# Patient Record
Sex: Male | Born: 1965
Health system: Southern US, Community
[De-identification: ages and names within clinical notes are randomized; demographics above are authoritative.]

## PROBLEM LIST (undated history)

## (undated) DIAGNOSIS — T7840XA Allergy, unspecified, initial encounter: Secondary | ICD-10-CM

## (undated) DIAGNOSIS — R0600 Dyspnea, unspecified: Secondary | ICD-10-CM

## (undated) DIAGNOSIS — E785 Hyperlipidemia, unspecified: Secondary | ICD-10-CM

## (undated) DIAGNOSIS — I509 Heart failure, unspecified: Secondary | ICD-10-CM

## (undated) DIAGNOSIS — E119 Type 2 diabetes mellitus without complications: Secondary | ICD-10-CM

## (undated) DIAGNOSIS — D649 Anemia, unspecified: Secondary | ICD-10-CM

## (undated) DIAGNOSIS — M109 Gout, unspecified: Secondary | ICD-10-CM

## (undated) DIAGNOSIS — J45909 Unspecified asthma, uncomplicated: Secondary | ICD-10-CM

## (undated) DIAGNOSIS — I10 Essential (primary) hypertension: Secondary | ICD-10-CM

## (undated) HISTORY — DX: Morbid (severe) obesity due to excess calories: E66.01

## (undated) HISTORY — DX: Essential (primary) hypertension: I10

## (undated) HISTORY — DX: Allergy, unspecified, initial encounter: T78.40XA

## (undated) HISTORY — DX: Hyperlipidemia, unspecified: E78.5

## (undated) HISTORY — DX: Gout, unspecified: M10.9

## (undated) HISTORY — DX: Type 2 diabetes mellitus without complications: E11.9

## (undated) HISTORY — DX: Unspecified asthma, uncomplicated: J45.909

---

## 2004-01-25 ENCOUNTER — Ambulatory Visit: Payer: Self-pay | Admitting: Family Medicine

## 2004-02-20 ENCOUNTER — Ambulatory Visit: Payer: Self-pay | Admitting: Family Medicine

## 2005-11-17 ENCOUNTER — Emergency Department: Payer: Self-pay | Admitting: Internal Medicine

## 2009-03-05 ENCOUNTER — Emergency Department: Payer: Self-pay

## 2009-04-30 ENCOUNTER — Ambulatory Visit: Payer: Self-pay | Admitting: Family Medicine

## 2011-04-29 DIAGNOSIS — I5022 Chronic systolic (congestive) heart failure: Secondary | ICD-10-CM | POA: Insufficient documentation

## 2011-11-01 ENCOUNTER — Emergency Department: Payer: Self-pay | Admitting: Emergency Medicine

## 2014-03-23 LAB — LIPID PANEL
Cholesterol: 164 mg/dL (ref 0–200)
HDL: 55 mg/dL (ref 35–70)
LDL Cholesterol: 79 mg/dL
Triglycerides: 150 mg/dL (ref 40–160)

## 2014-03-23 LAB — HEMOGLOBIN A1C: HEMOGLOBIN A1C: 16.1 % — AB (ref 4.0–6.0)

## 2014-03-23 LAB — BASIC METABOLIC PANEL
BUN: 23 mg/dL — AB (ref 4–21)
CREATININE: 1.5 mg/dL — AB (ref 0.6–1.3)
Glucose: 407 mg/dL
POTASSIUM: 4.3 mmol/L (ref 3.4–5.3)
SODIUM: 139 mmol/L (ref 137–147)

## 2014-03-23 LAB — HEPATIC FUNCTION PANEL
ALT: 15 U/L (ref 10–40)
AST: 13 U/L — AB (ref 14–40)

## 2014-04-20 ENCOUNTER — Ambulatory Visit
Admit: 2014-04-20 | Disposition: A | Discharge status: Home / Self Care | Payer: Self-pay | Attending: Family Medicine | Admitting: Family Medicine

## 2014-04-27 ENCOUNTER — Ambulatory Visit
Admit: 2014-04-27 | Disposition: A | Discharge status: Home / Self Care | Payer: Self-pay | Attending: Family Medicine | Admitting: Family Medicine

## 2014-04-28 DIAGNOSIS — I420 Dilated cardiomyopathy: Secondary | ICD-10-CM | POA: Insufficient documentation

## 2014-04-28 DIAGNOSIS — M109 Gout, unspecified: Secondary | ICD-10-CM | POA: Insufficient documentation

## 2014-04-28 DIAGNOSIS — Z6841 Body Mass Index (BMI) 40.0 and over, adult: Secondary | ICD-10-CM

## 2014-07-06 ENCOUNTER — Ambulatory Visit: Payer: Self-pay | Admitting: Family Medicine

## 2014-07-10 ENCOUNTER — Encounter: Payer: Self-pay | Admitting: Family Medicine

## 2014-07-10 DIAGNOSIS — N183 Chronic kidney disease, stage 3 unspecified: Secondary | ICD-10-CM

## 2014-07-10 DIAGNOSIS — IMO0001 Reserved for inherently not codable concepts without codable children: Secondary | ICD-10-CM

## 2014-07-10 DIAGNOSIS — R809 Proteinuria, unspecified: Secondary | ICD-10-CM | POA: Insufficient documentation

## 2014-07-10 DIAGNOSIS — E1129 Type 2 diabetes mellitus with other diabetic kidney complication: Secondary | ICD-10-CM | POA: Insufficient documentation

## 2014-07-10 DIAGNOSIS — N2581 Secondary hyperparathyroidism of renal origin: Secondary | ICD-10-CM | POA: Insufficient documentation

## 2014-07-10 DIAGNOSIS — N184 Chronic kidney disease, stage 4 (severe): Secondary | ICD-10-CM | POA: Insufficient documentation

## 2014-07-10 DIAGNOSIS — I10 Essential (primary) hypertension: Secondary | ICD-10-CM

## 2014-07-10 DIAGNOSIS — I129 Hypertensive chronic kidney disease with stage 1 through stage 4 chronic kidney disease, or unspecified chronic kidney disease: Secondary | ICD-10-CM | POA: Insufficient documentation

## 2014-07-11 ENCOUNTER — Ambulatory Visit: Payer: Self-pay | Admitting: Family Medicine

## 2014-07-16 ENCOUNTER — Ambulatory Visit: Payer: Self-pay | Admitting: Family Medicine

## 2014-07-22 ENCOUNTER — Other Ambulatory Visit: Payer: Self-pay | Admitting: Family Medicine

## 2014-07-23 ENCOUNTER — Telehealth: Payer: Self-pay | Admitting: Family Medicine

## 2014-07-23 NOTE — Telephone Encounter (Signed)
Please call pharmacy; find out if he has been compliant with filling the 300 mg pills or if he is just taking 100 mg This needs to be tapered and monitored Find out what he filled last and when Thanks

## 2014-07-23 NOTE — Telephone Encounter (Signed)
Routing to provider  

## 2014-07-23 NOTE — Telephone Encounter (Signed)
Per pharmacy, he has been getting 100mg .

## 2014-07-23 NOTE — Telephone Encounter (Signed)
Pt called needs a refill on Allipurinol? Pharm is [facility]. Thanks.

## 2014-07-23 NOTE — Telephone Encounter (Signed)
This is a duplicate message request.

## 2014-07-24 NOTE — Telephone Encounter (Signed)
I'm sorry, but I'm not going to refill this He has not had labs done in months He was supposed to start back on this medicine and have labs done 2 weeks later He cancelled his appointments with me and has not been in for a visit or labs as directed I will not refill this until he's seen and we get lab results back Please let him know we absolutely have to see him; there is a reason this medicine isn't just over-the-counter; it takes monitoring and can cause serious harm if not monitored Let him know that by not being compliant with visits and labs, he is putting himself at risk for a gout flare Avoid/limit foods high in purines (meats, gravies, etc.)

## 2014-07-24 NOTE — Telephone Encounter (Signed)
Patient notified

## 2014-08-07 ENCOUNTER — Ambulatory Visit (INDEPENDENT_AMBULATORY_CARE_PROVIDER_SITE_OTHER): Payer: PRIVATE HEALTH INSURANCE | Admitting: Family Medicine

## 2014-08-07 ENCOUNTER — Encounter: Payer: Self-pay | Admitting: Family Medicine

## 2014-08-07 VITALS — BP 133/85 | HR 90 | Temp 98.7°F | Wt 387.0 lb

## 2014-08-07 DIAGNOSIS — I10 Essential (primary) hypertension: Secondary | ICD-10-CM | POA: Diagnosis not present

## 2014-08-07 DIAGNOSIS — R809 Proteinuria, unspecified: Secondary | ICD-10-CM

## 2014-08-07 DIAGNOSIS — M1A39X0 Chronic gout due to renal impairment, multiple sites, without tophus (tophi): Secondary | ICD-10-CM | POA: Diagnosis not present

## 2014-08-07 DIAGNOSIS — I420 Dilated cardiomyopathy: Secondary | ICD-10-CM

## 2014-08-07 DIAGNOSIS — IMO0001 Reserved for inherently not codable concepts without codable children: Secondary | ICD-10-CM

## 2014-08-07 DIAGNOSIS — N183 Chronic kidney disease, stage 3 unspecified: Secondary | ICD-10-CM

## 2014-08-07 DIAGNOSIS — Z5181 Encounter for therapeutic drug level monitoring: Secondary | ICD-10-CM

## 2014-08-07 DIAGNOSIS — E1165 Type 2 diabetes mellitus with hyperglycemia: Secondary | ICD-10-CM

## 2014-08-07 LAB — LIPID PANEL PICCOLO, WAIVED
Chol/HDL Ratio Piccolo,Waive: 2.6 mg/dL
Cholesterol Piccolo, Waived: 110 mg/dL (ref <200)
HDL Chol Piccolo, Waived: 42 mg/dL — ABNORMAL LOW (ref >59)
LDL CHOL CALC PICCOLO WAIVED: 36 mg/dL (ref <100)
Triglycerides Piccolo,Waived: 159 mg/dL — ABNORMAL HIGH (ref <150)
VLDL Chol Calc Piccolo,Waive: 32 mg/dL — ABNORMAL HIGH (ref <30)

## 2014-08-07 LAB — BAYER DCA HB A1C WAIVED: HB A1C (BAYER DCA - WAIVED): 9.2 % — ABNORMAL HIGH (ref <7.0)

## 2014-08-07 NOTE — Assessment & Plan Note (Signed)
Almost to goal; limit salt, try to follow DASH guidelines

## 2014-08-07 NOTE — Assessment & Plan Note (Signed)
Well overdue today for A1C; checked here in the office

## 2014-08-07 NOTE — Assessment & Plan Note (Addendum)
Check uric acid level and creatinine; no more flares in a long time; continue allopurinol; not using colchicine at all, just has it on hand if needed; he prefers to use ibuprofen for flare

## 2014-08-07 NOTE — Assessment & Plan Note (Signed)
Creatinine part of today's labs

## 2014-08-07 NOTE — Assessment & Plan Note (Signed)
Going back to see heart doctor soon; limit salt

## 2014-08-07 NOTE — Patient Instructions (Signed)
I'll contact you with the lab results  Your goal blood pressure is less than 130 mmHg on top Try to follow the DASH guidelines (DASH stands for Dietary Approaches to Stop Hypertension) Try to limit the sodium in your diet.  Ideally, consume less than 1.5 grams (less than 1,500mg ) per day. Do not add salt when cooking or at the table.  Check the sodium amount on labels when shopping, and choose items lower in sodium when given a choice. Avoid or limit foods that already contain a lot of sodium. Eat a diet rich in fruits and vegetables and whole grains. Keep working on weight loss Check feet every night for sores or callouses or cuts and let us know right away of any problems Please do make an appointment to see your eye doctor and visit yearly

## 2014-08-07 NOTE — Progress Notes (Signed)
BP 133/85 mmHg  Pulse 90  Temp(Src) 98.7 F (37.1 C)  Wt 387 lb (175.542 kg)  SpO2 90%   Subjective:    Patient ID: [name], male    DOB: 1965-04-26, 49 y.o.   MRN: [id number]  HPI: [name] is a 49 y.o. male  Chief Complaint  Patient presents with  . Diabetes  . Hypertension  . Hyperlipidemia   Diabetes mellitus type 2 Checking sugars?  yes How often? Once a day or every 2-3 days Range (low to high) over last two weeks: 180 to 184; no lows at all Feels diabetes is under good control Does patient feel additional teaching/training would be helpful?  yes, maybe later Trying to limit white bread, white rice, white potatoes, sweets?  yes, limiting, not completely eliminated Trying to limit sweetened drinks like iced tea, soft drinks, sports drinks, fruit juices?  yes Exercise/activity level:  sedentary (essential no activity) Checking feet every day/night?  yes Last eye exam: overdue Started Trulicity about 3 weeks ago; no abdominal pain with Trulicity; he thinks he needs more insulin; using glucophage pill; on Trulicity; no insulin right now  Lab Results  Component Value Date   HGBA1C 16.1* 03/23/2014   Seeing kidney doctor for decreased kidney function but he was okay with using metformin; not sure when he goes back to see kidney doctor   Sees heart doctor, overdue, was supposed to be seen in January; his usual doctor retired and he has a new person; [employer]  Relevant past medical, surgical, family and social history reviewed and updated as indicated. Interim medical history since our last visit reviewed. Allergies and medications reviewed and updated.  Review of Systems Per HPI unless specifically indicated above     Objective:    BP 133/85 mmHg  Pulse 90  Temp(Src) 98.7 F (37.1 C)  Wt 387 lb (175.542 kg)  SpO2 90%  Wt Readings from Last 3 Encounters:  08/07/14 387 lb (175.542 kg)  04/04/14 385 lb (174.635 kg)    Physical Exam   Constitutional: He appears well-developed and well-nourished.  HENT:  Head: Normocephalic and atraumatic.  Eyes: No scleral icterus.  Neck: No thyromegaly present.  Cardiovascular: Normal rate and regular rhythm.   Heart sounds distant due to body habitus  Pulmonary/Chest: Effort normal and breath sounds normal. He has no wheezes.  Abdominal: Soft. Bowel sounds are normal. There is no tenderness.  Morbidly obese with large pannus  Musculoskeletal: He exhibits edema (nonpitting edema both lower extremities).  Skin: Skin is warm and dry. No pallor.  Psychiatric: He has a normal mood and affect. His behavior is normal. Judgment and thought content normal.   Diabetic Foot Form - Detailed   Diabetic Foot Exam - detailed  Diabetic Foot exam was performed with the following findings:  Yes 08/07/2014  9:07 AM  Visual Foot Exam completed.:  Yes  Is there a history of foot ulcer?:  No  Can the patient see the bottom of their feet?:  Yes    Pulse Foot Exam completed.:  Yes  Right Dorsalis Pedis:  Present Left Dorsalis Pedis:  Present  Sensory Foot Exam Completed.:  Yes  Semmes-Weinstein Monofilament Test  R Site 1-Great Toe:  Pos L Site 1-Great Toe:  Pos  R Site 4:  Pos L Site 4:  Pos    Comments:  Intact to vibration sense both great toes; intact to monofilament at sites 1, 4, 10      Results for  orders placed or performed in visit on [id number]  Basic metabolic panel  Result Value Ref Range   Glucose 407 mg/dL   BUN 23 (A) 4 - 21 mg/dL   Creatinine 1.5 (A) 0.6 - 1.3 mg/dL   Potassium 4.3 3.4 - 5.3 mmol/L   Sodium 139 137 - 147 mmol/L  Lipid panel  Result Value Ref Range   Triglycerides 150 40 - 160 mg/dL   Cholesterol 164 0 - 200 mg/dL   HDL 55 35 - 70 mg/dL   LDL Cholesterol 79 mg/dL  Hepatic function panel  Result Value Ref Range   ALT 15 10 - 40 U/L   AST 13 (A) 14 - 40 U/L  Hemoglobin A1c  Result Value Ref Range   Hgb A1c MFr Bld 16.1 (A) 4.0 - 6.0 %      Assessment &  Plan:   Problem List Items Addressed This Visit      Cardiovascular and Mediastinum   Dilated cardiomyopathy    Going back to see heart doctor soon; limit salt      Benign hypertension    Almost to goal; limit salt, try to follow DASH guidelines        Endocrine   Uncontrolled type 2 diabetes mellitus with proteinuria or albuminuria - Primary    Well overdue today for A1C; checked here in the office      Relevant Medications   metFORMIN (GLUCOPHAGE-XR) 500 MG 24 hr tablet   TRULICITY [id number] SOPN   Other Relevant Orders   Lipid Panel Piccolo, Waived   Bayer DCA Hb A1c Waived     Genitourinary   Chronic kidney disease (CKD), stage III (moderate)    Creatinine part of today's labs        Other   Morbid obesity    Keep working on healthy diet; if he could lose 100 pounds, his health would improve so much      Relevant Medications   metFORMIN (GLUCOPHAGE-XR) 500 MG 24 hr tablet   TRULICITY [id number] SOPN   Gout    Check uric acid level and creatinine; no more flares in a long time; continue allopurinol; not using colchicine at all, just has it on hand if needed; he prefers to use ibuprofen for flare      Relevant Orders   Comprehensive metabolic panel    Other Visit Diagnoses    Medication monitoring encounter        Relevant Orders    Uric acid      An After Visit Summary was printed and given to the patient.  Follow up plan: Return in about 3 months (around 11/07/2014) for diabetes, gout.

## 2014-08-07 NOTE — Assessment & Plan Note (Signed)
Keep working on [employer]; if he could lose 100 pounds, his health would improve so much

## 2014-08-08 ENCOUNTER — Other Ambulatory Visit: Payer: Self-pay | Admitting: Family Medicine

## 2014-08-08 DIAGNOSIS — IMO0001 Reserved for inherently not codable concepts without codable children: Secondary | ICD-10-CM

## 2014-08-08 LAB — COMPREHENSIVE METABOLIC PANEL
ALT: 16 IU/L (ref 0–44)
AST: 11 IU/L (ref 0–40)
Albumin/Globulin Ratio: 1.1 (ref 1.1–2.5)
Albumin: 3.3 g/dL — ABNORMAL LOW (ref 3.5–5.5)
Alkaline Phosphatase: 79 IU/L (ref 39–117)
BUN / CREAT RATIO: 13 (ref 9–20)
BUN: 23 mg/dL (ref 6–24)
Bilirubin Total: 0.5 mg/dL (ref 0.0–1.2)
CHLORIDE: 96 mmol/L — AB (ref 97–108)
CO2: 31 mmol/L — ABNORMAL HIGH (ref 18–29)
Calcium: 8.9 mg/dL (ref 8.7–10.2)
Creatinine, Ser: 1.74 mg/dL — ABNORMAL HIGH (ref 0.76–1.27)
GFR calc non Af Amer: 46 mL/min/{1.73_m2} — ABNORMAL LOW (ref >59)
GFR, EST AFRICAN AMERICAN: 53 mL/min/{1.73_m2} — AB (ref >59)
Globulin, Total: 3 g/dL (ref 1.5–4.5)
Glucose: 189 mg/dL — ABNORMAL HIGH (ref 65–99)
POTASSIUM: 4.2 mmol/L (ref 3.5–5.2)
SODIUM: 142 mmol/L (ref 134–144)
Total Protein: 6.3 g/dL (ref 6.0–8.5)

## 2014-08-08 LAB — URIC ACID: Uric Acid: 8.1 mg/dL (ref 3.7–8.6)

## 2014-08-08 MED ORDER — TRULICITY 0.75 MG/0.5ML ~~LOC~~ SOAJ: 1.0000 "pen " | SUBCUTANEOUS | Status: DC

## 2014-08-08 MED ORDER — INSULIN GLARGINE 300 UNIT/ML ~~LOC~~ SOPN: 15.0000 [IU] | PEN_INJECTOR | Freq: Every evening | SUBCUTANEOUS | Status: DC

## 2014-08-08 NOTE — Telephone Encounter (Signed)
We'll ask his kidney doctor to manage his gout from now on; please send refill request to them along with the latest labs; I'm concerned about this medicine with his kidney function so I'd very much appreciate if the kidney doctor will take this over

## 2014-08-08 NOTE — Telephone Encounter (Signed)
Routing to provider  

## 2014-08-09 NOTE — Telephone Encounter (Signed)
This message and labs faxed to [facility]. Advised them to let me if they can't take over the RX or if they have any questions.

## 2014-08-10 ENCOUNTER — Other Ambulatory Visit: Payer: Self-pay | Admitting: Family Medicine

## 2014-08-13 NOTE — Telephone Encounter (Signed)
Your rx 

## 2014-08-19 ENCOUNTER — Other Ambulatory Visit: Payer: Self-pay | Admitting: Family Medicine

## 2014-08-29 ENCOUNTER — Other Ambulatory Visit: Payer: Self-pay | Admitting: Family Medicine

## 2014-08-29 NOTE — Telephone Encounter (Signed)
Routing to provider  

## 2014-09-06 ENCOUNTER — Telehealth: Payer: Self-pay

## 2014-09-06 MED ORDER — INSULIN GLARGINE 300 UNIT/ML ~~LOC~~ SOPN: 15.0000 [IU] | PEN_INJECTOR | Freq: Every evening | SUBCUTANEOUS | Status: DC

## 2014-09-06 NOTE — Telephone Encounter (Signed)
No, but I have sent a prescription to [facility]

## 2014-09-06 NOTE — Telephone Encounter (Signed)
Left message for patient that we are unable to provide samples any more and that an rx was sent to his pharmacy.

## 2014-09-06 NOTE — Telephone Encounter (Signed)
He would like samples of Toujeo.

## 2014-09-12 ENCOUNTER — Telehealth: Payer: Self-pay

## 2014-09-12 NOTE — Telephone Encounter (Signed)
Ranges 140-180 range; checking sugars afternoon or sometimes in the morning; if he doesn't eat anything, 130-140 He was thinking 100 is the range; no, that's too low Explained that metformin can be dangerous I encouraged him to talk to [place] or [place] at pharmacy and find out what the cheapest insulin and I will switch him over

## 2014-09-12 NOTE — Telephone Encounter (Signed)
I notified patient to not take the Metformin at all. He states that his glucose readings are staying in the 180s.

## 2014-09-12 NOTE — Telephone Encounter (Signed)
He can't afford the Toujeo, he states it is over $100 with his insurance. But he wants to know if he can increase it from 15 to 18 units. Once he runs out of the toujeo, he wants to know if he can take his left over Metformin until he can afford to get the Toujeo. If so, how much and how often does he need to take it?

## 2014-09-12 NOTE — Telephone Encounter (Signed)
NO, he should NOT take metformin What are his sugars? Why is he asking to increase the dose? I can't make that judgment without knowing what his glucose readings are please

## 2014-10-22 ENCOUNTER — Telehealth: Payer: Self-pay | Admitting: Family Medicine

## 2014-10-22 NOTE — Telephone Encounter (Signed)
He states his acid reflux is worse. It was hard to get a clear answer from him, once he said he was taking 4 or 5 acid meds a day and then he changed and said no. That he was taking 4 or 5 omeprazole a day. Sometimes up to 80mg  of omeprazole a day. I advised him that was very dangerous and not to take that much.  He wants to know if there was something else he can take.

## 2014-10-22 NOTE — Telephone Encounter (Signed)
Thank you for advising him that he is taking his medicine inappropriately; appointment please If he's not sure if acid reflux, he needs to call his heart doctor right away or go to the hospital if any chance his discomfort is cardiac

## 2014-10-22 NOTE — Telephone Encounter (Signed)
Advised patient if he thought it was cardiac to seek medical attention immediately, he does not think it is. Reminded him not to take more than 40mg  of Omeprazole a day and not to take other acid reflux meds like Nexium with it. He has an appointment on Oct. 12th, he said he'd wait until then.

## 2014-10-22 NOTE — Telephone Encounter (Signed)
Pt called requested call back ASAP. Stated he has a question. Thanks.

## 2014-11-07 ENCOUNTER — Telehealth: Payer: Self-pay

## 2014-11-07 ENCOUNTER — Ambulatory Visit: Payer: PRIVATE HEALTH INSURANCE | Admitting: Family Medicine

## 2014-11-07 NOTE — Telephone Encounter (Signed)
Patient wants to know if you can "retain water in his stomach". He says he has a lot of bloating and gas. I asked him how long had it been going on and he just said "off and on".Advice?

## 2014-11-07 NOTE — Telephone Encounter (Signed)
He needs an appointment; he was supposed to be seen today; no over the phone advice for this; needs appointment

## 2014-11-07 NOTE — Telephone Encounter (Signed)
Patient left message to call.

## 2014-11-08 ENCOUNTER — Encounter: Payer: Self-pay | Admitting: Family Medicine

## 2014-11-08 NOTE — Telephone Encounter (Signed)
I am going to stop reinforcing this behavior, where he does not come in for appointments and then wants advice over the phone I am sending a letter He needs an appointment I will see him in the office

## 2014-11-08 NOTE — Telephone Encounter (Signed)
Spoke with patient and stated that he supposedly has a stomach virus and he does not need to come in. He said that doing his insulin at night time cause him to not be able to sleep, therefore he wants to know if he can do it in the morning because it gives him more energy.

## 2014-11-15 ENCOUNTER — Telehealth: Payer: Self-pay | Admitting: Family Medicine

## 2014-11-15 NOTE — Telephone Encounter (Signed)
Pt has a spot on his leg that is leaking fluid and he would like to know if there is anything he should do besides putting a band aid on it. He said it started last night and he thinks he may have hit it on something yesterday.

## 2014-11-16 NOTE — Telephone Encounter (Signed)
Routing to provider  

## 2014-11-16 NOTE — Telephone Encounter (Signed)
He needs to make an appt to be seen

## 2014-11-19 NOTE — Telephone Encounter (Signed)
I called 3x and no answer left vm to call us back because he needs to be seen.

## 2014-11-28 ENCOUNTER — Ambulatory Visit: Payer: PRIVATE HEALTH INSURANCE | Admitting: Family Medicine

## 2014-12-10 ENCOUNTER — Telehealth: Payer: Self-pay

## 2014-12-10 NOTE — Telephone Encounter (Signed)
Just wanted to let you know that he has cancelled 6 appt's since we started EPIC. He's cancelled on 07/06/14, 07/01/14, 07/16/14, 11/07/14, 11/28/14, and 11/1714. He has rescheduled again for 12/31/14.

## 2014-12-12 NOTE — Telephone Encounter (Signed)
Please talk with patient about office policy, frequent cancellations; he is noncompliant and this makes it very difficult to treat his serious chronic health conditions

## 2014-12-13 ENCOUNTER — Ambulatory Visit: Payer: PRIVATE HEALTH INSURANCE | Admitting: Family Medicine

## 2014-12-17 ENCOUNTER — Telehealth: Payer: Self-pay

## 2014-12-17 NOTE — Telephone Encounter (Signed)
He is having a flare of varicose veins on his legs, he states he is using a sleeve he had previously and ice. I advised him he'd need to be seen to have them checked, he refused, said he owes [place] too much money. I advised him if it got worse to call our office for an appt.

## 2014-12-17 NOTE — Telephone Encounter (Signed)
Patient is overdue for diabetes follow-up He is noncompliant

## 2014-12-17 NOTE — Telephone Encounter (Signed)
Patient left message asking for me to call him.

## 2014-12-17 NOTE — Telephone Encounter (Signed)
Left message to call.

## 2014-12-18 ENCOUNTER — Other Ambulatory Visit: Payer: Self-pay

## 2014-12-18 DIAGNOSIS — IMO0001 Reserved for inherently not codable concepts without codable children: Secondary | ICD-10-CM

## 2014-12-18 MED ORDER — TRULICITY 0.75 MG/0.5ML ~~LOC~~ SOAJ: 1.0000 "pen " | SUBCUTANEOUS | Status: DC

## 2014-12-18 NOTE — Telephone Encounter (Signed)
LAST VISIT: 08/07/2014 NEXT APPT: 12/31/2014  Request for Trulicity pen 0.75mg /0.5ml

## 2014-12-19 ENCOUNTER — Other Ambulatory Visit: Payer: Self-pay

## 2014-12-19 MED ORDER — OMEPRAZOLE 40 MG PO CPDR: 40.0000 mg | DELAYED_RELEASE_CAPSULE | Freq: Every day | ORAL | Status: DC

## 2014-12-19 NOTE — Telephone Encounter (Signed)
Dr. Sanda Klein patient-routing to provider.

## 2014-12-26 NOTE — Telephone Encounter (Signed)
Appt. Scheduled.

## 2014-12-31 ENCOUNTER — Ambulatory Visit: Payer: PRIVATE HEALTH INSURANCE | Admitting: Family Medicine

## 2015-01-01 ENCOUNTER — Encounter: Payer: Managed Care, Other (non HMO) | Attending: Surgery | Admitting: Surgery

## 2015-01-01 DIAGNOSIS — Z794 Long term (current) use of insulin: Secondary | ICD-10-CM | POA: Insufficient documentation

## 2015-01-01 DIAGNOSIS — I5040 Unspecified combined systolic (congestive) and diastolic (congestive) heart failure: Secondary | ICD-10-CM | POA: Diagnosis not present

## 2015-01-01 DIAGNOSIS — F17218 Nicotine dependence, cigarettes, with other nicotine-induced disorders: Secondary | ICD-10-CM | POA: Diagnosis not present

## 2015-01-01 DIAGNOSIS — E11622 Type 2 diabetes mellitus with other skin ulcer: Secondary | ICD-10-CM | POA: Diagnosis not present

## 2015-01-01 DIAGNOSIS — I890 Lymphedema, not elsewhere classified: Secondary | ICD-10-CM | POA: Insufficient documentation

## 2015-01-01 DIAGNOSIS — L02415 Cutaneous abscess of right lower limb: Secondary | ICD-10-CM | POA: Diagnosis not present

## 2015-01-01 DIAGNOSIS — I10 Essential (primary) hypertension: Secondary | ICD-10-CM | POA: Diagnosis not present

## 2015-01-01 DIAGNOSIS — L97222 Non-pressure chronic ulcer of left calf with fat layer exposed: Secondary | ICD-10-CM | POA: Insufficient documentation

## 2015-01-01 DIAGNOSIS — F329 Major depressive disorder, single episode, unspecified: Secondary | ICD-10-CM | POA: Insufficient documentation

## 2015-01-01 DIAGNOSIS — K219 Gastro-esophageal reflux disease without esophagitis: Secondary | ICD-10-CM | POA: Diagnosis not present

## 2015-01-01 NOTE — Progress Notes (Addendum)
JACEN, MAHANNAH ([id number]) Visit Report for 01/01/2015 Chief Complaint Document Details Patient Name: [name], [name]. Date of Service: 01/01/2015 8:00 AM Medical Record Number: [id number] Patient Account Number: [id number] Date of Birth/Sex: Jun 23, 1965 (49 y.o. Male) Treating RN: Ahmed Prima Primary Care Physician: [name], [place] Other Clinician: Referring Physician: [name], MELINDA Treating Physician/Extender: Frann Rider in Treatment: 0 Information Obtained from: Patient Chief Complaint Patients presents for treatment of an open diabetic ulcer over the right lower extremity with swelling of both legs for about 1 month. Electronic Signature(s) Signed: 01/01/2015 9:59:13 AM By: Christin Fudge MD, FACS Entered By: Christin Fudge on 01/01/2015 09:59:12 [name] ([id number]) -------------------------------------------------------------------------------- Debridement Details Patient Name: [name] Date of Service: 01/01/2015 8:00 AM Medical Record Number: [id number] Patient Account Number: [id number] Date of Birth/Sex: March 20, 1965 (49 y.o. Male) Treating RN: Ahmed Prima Primary Care Physician: [name], [place] Other Clinician: Referring Physician: [name], [place] Treating Physician/Extender: Frann Rider in Treatment: 0 Debridement Performed for Wound #2 Right,Lateral,Superior Lower Leg Assessment: Performed By: Physician Christin Fudge, MD Debridement: Debridement Pre-procedure Yes Verification/Time Out Taken: Start Time: 09:35 Pain Control: Lidocaine 5% topical ointment Level: Skin/Subcutaneous Tissue Total Area Debrided (L x 1 (cm) x 1 (cm) = 1 (cm) W): Tissue and other Viable, Non-Viable, Eschar, Fibrin/Slough, Skin, Subcutaneous material debrided: Instrument: Curette Bleeding: Minimum Hemostasis Achieved: Pressure End Time: 09:40 Procedural Pain: 2 Post Procedural Pain: 0 Response to Treatment: Procedure was tolerated well Post  Debridement Measurements of Total Wound Length: (cm) 1 Width: (cm) 1 Depth: (cm) 0.3 Volume: (cm) 0.236 Post Procedure Diagnosis Same as Pre-procedure Electronic Signature(s) Signed: 01/01/2015 10:03:30 AM By: Christin Fudge MD, FACS Signed: 01/01/2015 6:00:56 PM By: Alric Quan Entered By: Christin Fudge on 01/01/2015 10:03:30 [name] ([id number]) -------------------------------------------------------------------------------- Debridement Details Patient Name: [name] Date of Service: 01/01/2015 8:00 AM Medical Record Number: [id number] Patient Account Number: [id number] Date of Birth/Sex: 1965-05-06 (49 y.o. Male) Treating RN: Ahmed Prima Primary Care Physician: [name], [place] Other Clinician: Referring Physician: [name], [place] Treating Physician/Extender: Frann Rider in Treatment: 0 Debridement Performed for Wound #4 Right,Distal Lower Leg Assessment: Performed By: Physician Christin Fudge, MD Debridement: Debridement Pre-procedure Yes Verification/Time Out Taken: Start Time: 09:35 Pain Control: Lidocaine 5% topical ointment Level: Skin/Subcutaneous Tissue Total Area Debrided (L x 1.3 (cm) x 1.5 (cm) = 1.95 (cm) W): Tissue and other Viable, Non-Viable, Eschar, Fibrin/Slough, Skin, Subcutaneous material debrided: Instrument: Curette Bleeding: Minimum Hemostasis Achieved: Pressure End Time: 09:40 Procedural Pain: 2 Post Procedural Pain: 0 Response to Treatment: Procedure was tolerated well Post Debridement Measurements of Total Wound Length: (cm) 1.3 Width: (cm) 1.5 Depth: (cm) 0.3 Volume: (cm) 0.459 Post Procedure Diagnosis Same as Pre-procedure Electronic Signature(s) Signed: 01/01/2015 10:04:06 AM By: Christin Fudge MD, FACS Signed: 01/01/2015 6:00:56 PM By: Alric Quan Entered By: Christin Fudge on 01/01/2015 10:04:05 [name]  ([id number]) -------------------------------------------------------------------------------- Debridement Details Patient Name: [name] Date of Service: 01/01/2015 8:00 AM Medical Record Number: [id number] Patient Account Number: [id number] Date of Birth/Sex: 08-24-1965 (49 y.o. Male) Treating RN: Ahmed Prima Primary Care Physician: [name], [place] Other Clinician: Referring Physician: [name], MELINDA Treating Physician/Extender: Frann Rider in Treatment: 0 Debridement Performed for Wound #6 Right,Dorsal,Inferior Lower Leg Assessment: Performed By: Physician Christin Fudge, MD Debridement: Debridement Pre-procedure Yes Verification/Time Out Taken: Start Time: 09:35 Pain Control: Lidocaine 5% topical ointment Level: Skin/Subcutaneous Tissue Total Area Debrided (L x 0.3 (cm) x 0.3 (cm) = 0.09 (cm) W): Tissue and other Viable, Non-Viable, Eschar, Fibrin/Slough, Skin, Subcutaneous material  debrided: Instrument: Curette Bleeding: Minimum Hemostasis Achieved: Pressure End Time: 09:40 Procedural Pain: 2 Post Procedural Pain: 0 Response to Treatment: Procedure was tolerated well Post Debridement Measurements of Total Wound Length: (cm) 0.3 Width: (cm) 0.3 Depth: (cm) 0.2 Volume: (cm) 0.014 Post Procedure Diagnosis Same as Pre-procedure Electronic Signature(s) Signed: 01/01/2015 10:04:50 AM By: Christin Fudge MD, FACS Signed: 01/01/2015 6:00:56 PM By: Alric Quan Entered By: Christin Fudge on 01/01/2015 10:04:50 [name] ([id number]) -------------------------------------------------------------------------------- Debridement Details Patient Name: [name] Date of Service: 01/01/2015 8:00 AM Medical Record Number: [id number] Patient Account Number: [id number] Date of Birth/Sex: 23-Sep-1965 (49 y.o. Male) Treating RN: Ahmed Prima Primary Care Physician: [name], [place] Other Clinician: Referring Physician: [name], [place] Treating  Physician/Extender: Frann Rider in Treatment: 0 Debridement Performed for Wound #5 Right,Dorsal Lower Leg Assessment: Performed By: Physician Christin Fudge, MD Debridement: Debridement Pre-procedure Yes Verification/Time Out Taken: Start Time: 09:35 Pain Control: Lidocaine 5% topical ointment Level: Skin/Subcutaneous Tissue Total Area Debrided (L x 0.5 (cm) x 0.7 (cm) = 0.35 (cm) W): Tissue and other Viable, Non-Viable, Eschar, Fibrin/Slough, Skin, Subcutaneous material debrided: Instrument: Curette Bleeding: Minimum Hemostasis Achieved: Pressure End Time: 09:40 Procedural Pain: 2 Post Procedural Pain: 0 Response to Treatment: Procedure was tolerated well Post Debridement Measurements of Total Wound Length: (cm) 0.5 Stage: Category/Stage II Width: (cm) 0.7 Depth: (cm) 0.3 Volume: (cm) 0.082 Post Procedure Diagnosis Same as Pre-procedure Electronic Signature(s) Signed: 01/01/2015 10:05:20 AM By: Christin Fudge MD, FACS Signed: 01/01/2015 6:00:56 PM By: Alric Quan Entered By: Christin Fudge on 01/01/2015 10:05:20 [name] ([id number]) -------------------------------------------------------------------------------- HPI Details Patient Name: [name] Date of Service: 01/01/2015 8:00 AM Medical Record Number: [id number] Patient Account Number: [id number] Date of Birth/Sex: 17-Oct-1965 (49 y.o. Male) Treating RN: Ahmed Prima Primary Care Physician: [name], [place] Other Clinician: Referring Physician: [name], MELINDA Treating Physician/Extender: Frann Rider in Treatment: 0 History of Present Illness Location: Started getting pustules and boils on his right lower extremity along with swelling and was treated for this Quality: Patient reports experiencing a dull pain to affected area(s). Severity: Patient states wound are getting worse. Duration: Patient has had the wound for < 4 weeks prior to presenting for treatment Timing: Pain  in wound is Intermittent (comes and goes Context: The wound appeared gradually over time Modifying Factors: Other treatment(s) tried include: local care and antibiotics and some compression stockings. Associated Signs and Symptoms: Patient reports having difficulty standing for long periods. HPI Description: 49 year old gentleman who has type 2 diabetes mellitus and morbid obesity has been having cellulitis and abscess of the right lower leg for the last 3 weeks. He has had multiple antibiotics and an IandD and over the last 10 days has been on Septra DS and doxycycline and prior to that Keflex. the patient is a poor historian and does not give a definite timeline but says he's been diagnosed with diabetes mellitus for about 8-10 months and has been on insulin. He does not measure his blood sugars regularly nor does he see his PCP regularly. Also has congestive heart failure for which he sees a cardiologist at [facility] but this is going to change as the cardiologist as retired. He is a smoker and smokes several cigars a day and drinks alcohol occasionally. He has been morbidly obese for several years. Electronic Signature(s) Signed: 01/01/2015 10:01:54 AM By: Christin Fudge MD, FACS Previous Signature: 01/01/2015 8:19:23 AM Version By: Christin Fudge MD, FACS Entered By: Christin Fudge on 01/01/2015 10:01:54 [name] ([id number]) --------------------------------------------------------------------------------  Physical Exam Details Patient Name: GRAISON, GUIFFRE Date of Service: 01/01/2015 8:00 AM Medical Record Number: [id number] Patient Account Number: [id number] Date of Birth/Sex: Oct 30, 1965 (49 y.o. Male) Treating RN: Ahmed Prima Primary Care Physician: [name], [place] Other Clinician: Referring Physician: [name], MELINDA Treating Physician/Extender: Frann Rider in Treatment: 0 Constitutional morbidly obese and difficult to move around because of congestive heart  failure and lymphedema.. Eyes Nonicteric. Reactive to light. Ears, Nose, Mouth, and Throat Lips, teeth, and gums WNL.[name] Kitchen Moist mucosa without lesions . Neck supple and nontender. No palpable supraclavicular or cervical adenopathy. Normal sized without goiter. Respiratory WNL. No retractions.. Cardiovascular Pedal Pulses WNL. ABI left and right leg is 0.95. bilateral lower extremity lymphedema. Gastrointestinal (GI) Abdomen without masses or tenderness.. No liver or spleen enlargement or tenderness.. Lymphatic No adneopathy. No adenopathy. No adenopathy. Musculoskeletal Adexa without tenderness or enlargement.. Digits and nails w/o clubbing, cyanosis, infection, petechiae, ischemia, or inflammatory conditions.. Integumentary (Hair, Skin) No suspicious lesions. No crepitus or fluctuance. No peri-wound warmth or erythema. No masses.[name] Kitchen Psychiatric Judgement and insight Intact.. No evidence of depression, anxiety, or agitation.. Notes has bilateral lymphedema and has several ulcerations on the right lateral leg with significant amount of subcutaneous debris and necrotic material. The lymphedema stage II Electronic Signature(s) Signed: 01/01/2015 10:07:35 AM By: Christin Fudge MD, FACS Entered By: Christin Fudge on 01/01/2015 10:07:35 [name] ([id number]) -------------------------------------------------------------------------------- Physician Orders Details Patient Name: [name] Date of Service: 01/01/2015 8:00 AM Medical Record Number: [id number] Patient Account Number: [id number] Date of Birth/Sex: 03/20/1965 (49 y.o. 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 Wound #2 Right,Lateral,Superior Lower Leg o Clean wound with Normal Saline. Wound #3 Right,Medial Lower Leg o Clean wound with Normal Saline. Wound #4 Right,Distal Lower Leg o Clean wound with Normal Saline. Wound #5 Right,Dorsal Lower Leg o Clean wound with Normal Saline. Wound #6 Right,Dorsal,Inferior Lower Leg o Clean wound with Normal Saline. Anesthetic Wound #1 Right,Anterior Lower Leg o Topical Lidocaine 4% cream applied to wound bed prior to debridement Wound #2 Right,Lateral,Superior Lower Leg o Topical Lidocaine 4% cream applied to wound bed prior to debridement KORTNEY, CANTERA ([id number]) Wound #3 Right,Medial Lower Leg o Topical Lidocaine 4% cream applied to wound bed prior to debridement Wound #4 Right,Distal Lower Leg o Topical Lidocaine 4% cream applied to wound bed prior to debridement Wound #5 Right,Dorsal Lower Leg o Topical Lidocaine 4% cream applied to wound bed prior to debridement Wound #6 Right,Dorsal,Inferior Lower Leg o Topical Lidocaine 4% cream applied to wound bed prior to debridement Primary Wound Dressing Wound #1 Right,Anterior Lower Leg o Aquacel Ag Wound #2 Right,Lateral,Superior Lower Leg o Aquacel Ag Wound #3 Right,Medial Lower Leg o Aquacel Ag Wound #4 Right,Distal Lower Leg o Aquacel Ag Wound #5 Right,Dorsal Lower Leg o Aquacel Ag Wound #6 Right,Dorsal,Inferior Lower Leg o Aquacel Ag Secondary  Dressing Wound #1 Right,Anterior  Lower Leg o Foam Wound #2 Right,Lateral,Superior Lower Leg o Foam Wound #3 Right,Medial Lower Leg o Foam Wound #4 Right,Distal Lower Leg o Foam Wound #5 Right,Dorsal Lower Leg o Foam Wound #6 Right,Dorsal,Inferior Lower Leg BRINKLEY, LACOURT. ([id number]) o Foam Dressing Change Frequency Wound #1 Right,Anterior Lower Leg o Change dressing every week Wound #2 Right,Lateral,Superior Lower Leg o Change dressing every week Wound #3 Right,Medial Lower Leg o Change dressing every week Wound #4 Right,Distal Lower Leg o Change dressing every week Wound #5 Right,Dorsal Lower Leg o Change dressing every week Wound #6 Right,Dorsal,Inferior Lower Leg o Change dressing every week Follow-up Appointments Wound #1 Right,Anterior Lower Leg o Return Appointment in 1 week. Wound #2 Right,Lateral,Superior Lower Leg o Return Appointment in 1 week. Wound #3 Right,Medial Lower Leg o Return Appointment in 1 week. Wound #4 Right,Distal Lower Leg o Return Appointment in 1 week. Wound #5 Right,Dorsal Lower Leg o Return Appointment in 1 week. Wound #6 Right,Dorsal,Inferior Lower Leg o Return Appointment in 1 week. Edema Control Wound #1 Right,Anterior Lower Leg o 3 Layer Compression System - Right Lower Extremity Wound #2 Right,Lateral,Superior Lower Leg o 3 Layer Compression System - Right Lower Extremity Wound #3 Right,Medial Lower Leg LAITHAN, LOEFFEL. ([id number]) o 3 Layer Compression System - Right Lower Extremity Wound #4 Right,Distal Lower Leg o 3 Layer Compression System - Right Lower Extremity Wound #5 Right,Dorsal Lower Leg o 3 Layer Compression System - Right Lower Extremity Wound #6 Right,Dorsal,Inferior Lower Leg o 3 Layer Compression System - Right Lower Extremity Electronic Signature(s) Signed: 01/01/2015 4:34:28 PM By: Christin Fudge MD, FACS Signed: 01/01/2015 6:00:56 PM By: Alric Quan Entered By: Alric Quan on 01/01/2015 10:11:39 [name] ([id number]) -------------------------------------------------------------------------------- Problem List Details Patient Name: [name] Date of Service: 01/01/2015 8:00 AM Medical Record Number: [id number] Patient Account Number: [id number] Date of Birth/Sex: Sep 28, 1965 (49 y.o. Male) Treating RN: Ahmed Prima Primary Care Physician: [name], MELINDA Other Clinician: Referring Physician: [name], MELINDA Treating Physician/Extender: Frann Rider in Treatment: 0 Active Problems ICD-10 Encounter Code Description Active Date Diagnosis E11.622 Type 2 diabetes mellitus with other skin ulcer 01/01/2015 Yes E66.01 Morbid (severe) obesity due to excess calories 01/01/2015 Yes L02.415 Cutaneous abscess of right lower limb 01/01/2015 Yes L97.222 Non-pressure chronic ulcer of left calf with fat layer 01/01/2015 Yes exposed I89.0 Lymphedema, not elsewhere classified 01/01/2015 Yes I50.40 Unspecified combined systolic (congestive) and diastolic [id number] Yes (congestive) heart failure F17.218 Nicotine dependence, cigarettes, with other nicotine- 01/01/2015 Yes induced disorders Inactive Problems Resolved Problems Electronic Signature(s) Signed: 01/01/2015 10:02:14 AM By: Christin Fudge MD, FACS Previous Signature: 01/01/2015 9:58:50 AM Version By: Christin Fudge MD, FACS Previous Signature: 01/01/2015 8:21:38 AM Version By: Christin Fudge MD, FACS Entered By: Christin Fudge on 01/01/2015 10:02:14 [name] ([id number] Mattes, Zachrey W. ([id number]) -------------------------------------------------------------------------------- Progress Note Details Patient Name: [name] Date of Service: 01/01/2015 8:00 AM Medical Record Number: [id number] Patient Account Number: [id number] Date of Birth/Sex: 1965-12-23 (49 y.o. Male) Treating RN: Ahmed Prima Primary Care Physician: [name], [place] Other  Clinician: Referring Physician: [name], MELINDA Treating Physician/Extender: Frann Rider in Treatment: 0 Subjective Chief Complaint Information obtained from Patient Patients presents for treatment of an open diabetic ulcer over the right lower extremity with swelling of both legs for about 1 month. History of Present Illness (HPI) The following HPI elements were documented for the patient's wound: Location: Started getting pustules and boils on his right lower extremity along with swelling and was treated for this Quality:  Patient reports experiencing a dull pain to affected area(s). Severity: Patient states wound are getting worse. Duration: Patient has had the wound for < 4 weeks prior to presenting for treatment Timing: Pain in wound is Intermittent (comes and goes Context: The wound appeared gradually over time Modifying Factors: Other treatment(s) tried include: local care and antibiotics and some compression stockings. Associated Signs and Symptoms: Patient reports having difficulty standing for long periods. 49 year old gentleman who has type 2 diabetes mellitus and morbid obesity has been having cellulitis and abscess of the right lower leg for the last 3 weeks. He has had multiple antibiotics and an IandD and over the last 10 days has been on Septra DS and doxycycline and prior to that Keflex. the patient is a poor historian and does not give a definite timeline but says he's been diagnosed with diabetes mellitus for about 8-10 months and has been on insulin. He does not measure his blood sugars regularly nor does he see his PCP regularly. Also has congestive heart failure for which he sees a cardiologist at [facility] but this is going to change as the cardiologist as retired. He is a smoker and smokes several cigars a day and drinks alcohol occasionally. He has been morbidly obese for several years. Wound History Patient presents with 6 open wounds that have been  present for approximately 3 weeks. Patient has been treating wounds in the following manner: peroxide, ointment, wrapping. The wounds have been healed in the past but have re-opened. Laboratory tests have been performed in the last month. Patient reportedly has not tested positive for an antibiotic resistant organism. Patient reportedly has not tested positive for osteomyelitis. Patient reportedly has not had testing performed to evaluate circulation in the legs. Patient experiences the following problems associated with their wounds: infection, swelling. QADIR, KAPPLE ([id number]) Patient History Allergies bulk chemical (Severity: Severe, Reaction: nose bleeds, coughing) Family History Diabetes - Mother, Hypertension - Mother, Father, Siblings, No family history of Cancer, Heart Disease, Hereditary Spherocytosis, Kidney Disease, Lung Disease, Seizures, Stroke, Thyroid Problems, Tuberculosis. Social History Current every day smoker, Marital Status - Divorced, Alcohol Use - Moderate, Drug Use - No History, Caffeine Use - Daily. Medical History Eyes Denies history of Cataracts, Glaucoma, Optic Neuritis Hematologic/Lymphatic Patient has history of Lymphedema Denies history of Anemia, Hemophilia, Human Immunodeficiency Virus, Sickle Cell Disease Cardiovascular Patient has history of Congestive Heart Failure, Hypertension Denies history of Angina, Arrhythmia, Coronary Artery Disease, Deep Vein Thrombosis, Hypotension, Myocardial Infarction, Peripheral Arterial Disease, Peripheral Venous Disease, Phlebitis, Vasculitis Gastrointestinal Denies history of Cirrhosis , Colitis, Crohn s, Hepatitis A, Hepatitis B, Hepatitis C Endocrine Patient has history of Type II Diabetes Denies history of Type I Diabetes Immunological Denies history of Lupus Erythematosus, Raynaud s, Scleroderma Integumentary (Skin) Denies history of History of Burn, History of pressure wounds Musculoskeletal Patient  has history of Gout Denies history of Rheumatoid Arthritis, Osteoarthritis, Osteomyelitis Psychiatric Denies history of Anorexia/bulimia, Confinement Anxiety Patient is treated with Insulin. Blood sugar is not tested. Medical And Surgical History Notes Gastrointestinal GERD Psychiatric depression Review of Systems (ROS) Constitutional Symptoms (General Health) The patient has no complaints or symptoms. Eyes CALVIN, RENBERG ([id number]) Complains or has symptoms of Glasses / Contacts - glass. Denies complaints or symptoms of Dry Eyes, Vision Changes. Ear/Nose/Mouth/Throat The patient has no complaints or symptoms. Hematologic/Lymphatic Denies complaints or symptoms of Bleeding / Clotting Disorders, Human Immunodeficiency Virus. Respiratory The patient has no complaints or symptoms. Cardiovascular Denies complaints or symptoms of Chest pain,  LE edema. Gastrointestinal Denies complaints or symptoms of Frequent diarrhea, Nausea, Vomiting. Endocrine Denies complaints or symptoms of Hepatitis, Thyroid disease, Polydypsia (Excessive Thirst). Genitourinary The patient has no complaints or symptoms. Immunological Complains or has symptoms of Itching - legs. Denies complaints or symptoms of Hives. Integumentary (Skin) Complains or has symptoms of Wounds, Swelling. Denies complaints or symptoms of Bleeding or bruising tendency, Breakdown. Musculoskeletal Complains or has symptoms of Muscle Pain, Muscle Weakness. Neurologic The patient has no complaints or symptoms. Oncologic The patient has no complaints or symptoms. Psychiatric Complains or has symptoms of Anxiety. Medications Coreg gurd medication aspirin 81 mg tablet,delayed release oral 1 1 tablet,delayed release (DR/EC) oral lisinopril 20 mg tablet oral 1 1 tablet oral Toujeo SoloStar 300 unit/mL (1.5 mL) subcutaneous insulin pen subcutaneous insulin pen subcutaneous furosemide 40 mg tablet oral 1 1 tablet  oral Objective Constitutional SHAKEIL, MUSTIAN. ([id number]) morbidly obese and difficult to move around because of congestive heart failure and lymphedema.. Vitals Time Taken: 8:13 AM, Height: 71 in, Source: Stated, Weight: 360 lbs, Source: Stated, BMI: 50.2, Temperature: 97.9 F, Pulse: 84 bpm, Respiratory Rate: 20 breaths/min, Blood Pressure: 167/104 mmHg. Eyes Nonicteric. Reactive to light. Ears, Nose, Mouth, and Throat Lips, teeth, and gums WNL.[name] Kitchen Moist mucosa without lesions . Neck supple and nontender. No palpable supraclavicular or cervical adenopathy. Normal sized without goiter. Respiratory WNL. No retractions.. Cardiovascular Pedal Pulses WNL. ABI left and right leg is 0.95. bilateral lower extremity lymphedema. Gastrointestinal (GI) Abdomen without masses or tenderness.. No liver or spleen enlargement or tenderness.. Lymphatic No adneopathy. No adenopathy. No adenopathy. Musculoskeletal Adexa without tenderness or enlargement.. Digits and nails w/o clubbing, cyanosis, infection, petechiae, ischemia, or inflammatory conditions.[name] Kitchen Psychiatric Judgement and insight Intact.. No evidence of depression, anxiety, or agitation.. General Notes: has bilateral lymphedema and has several ulcerations on the right lateral leg with significant amount of subcutaneous debris and necrotic material. The lymphedema stage II Integumentary (Hair, Skin) No suspicious lesions. No crepitus or fluctuance. No peri-wound warmth or erythema. No masses.. Wound #1 status is Open. Original cause of wound was Gradually Appeared. The wound is located on the Right,Anterior Lower Leg. The wound measures 0.5cm length x 1cm width x 0.1cm depth; 0.393cm^2 area and 0.039cm^3 volume. The wound is limited to skin breakdown. There is no tunneling or undermining noted. There is a small amount of serosanguineous drainage noted. The wound margin is flat and intact. There is large (67-100%) red granulation within the  wound bed. There is no necrotic tissue within the wound bed. The periwound skin appearance had no abnormalities noted for texture. The periwound skin appearance exhibited: Moist. Periwound temperature was noted as No Abnormality. The periwound has tenderness on palpation. Wound #2 status is Open. Original cause of wound was Gradually Appeared. The wound is located on the [place] ([id number]) Right,Lateral,Superior Lower Leg. The wound measures 1cm length x 1cm width x 0.2cm depth; 0.785cm^2 area and 0.157cm^3 volume. The wound is limited to skin breakdown. There is no tunneling or undermining noted. There is a medium amount of serosanguineous drainage noted. The wound margin is flat and intact. There is no granulation within the wound bed. There is a large (67-100%) amount of necrotic tissue within the wound bed including Adherent Slough. The periwound skin appearance exhibited: Localized Edema, Moist. Periwound temperature was noted as No Abnormality. The periwound has tenderness on palpation. Wound #3 status is Open. Original cause of wound was Gradually Appeared. The wound is located on the Right,Medial Lower Leg. The  wound measures 2cm length x 1.2cm width x 0.2cm depth; 1.885cm^2 area and 0.377cm^3 volume. There is no tunneling or undermining noted. There is a medium amount of serosanguineous drainage noted. The wound margin is flat and intact. There is no granulation within the wound bed. There is a large (67-100%) amount of necrotic tissue within the wound bed including Adherent Slough. The periwound skin appearance exhibited: Localized Edema, Moist. The periwound has tenderness on palpation. Wound #4 status is Open. Original cause of wound was Gradually Appeared. The wound is located on the Right,Distal Lower Leg. The wound measures 1.3cm length x 1.5cm width x 0.2cm depth; 1.532cm^2 area and 0.306cm^3 volume. The wound is limited to skin breakdown. There is no tunneling or  undermining noted. There is a medium amount of serosanguineous drainage noted. There is no granulation within the wound bed. There is a large (67-100%) amount of necrotic tissue within the wound bed including Adherent Slough. The periwound skin appearance exhibited: Localized Edema, Moist. Periwound temperature was noted as No Abnormality. The periwound has tenderness on palpation. Wound #5 status is Open. Original cause of wound was Gradually Appeared. The wound is located on the Right,Dorsal Lower Leg. The wound measures 0.5cm length x 0.7cm width x 0.1cm depth; 0.275cm^2 area and 0.027cm^3 volume. The wound is limited to skin breakdown. There is no tunneling or undermining noted. There is a none present amount of drainage noted. The wound margin is flat and intact. There is no granulation within the wound bed. There is a large (67-100%) amount of necrotic tissue within the wound bed including Adherent Slough. The periwound skin appearance exhibited: Localized Edema, Moist. Periwound temperature was noted as No Abnormality. The periwound has tenderness on palpation. Wound #6 status is Open. Original cause of wound was Blister. The wound is located on the Right,Dorsal,Inferior Lower Leg. The wound measures 0.3cm length x 0.3cm width x 0.1cm depth; 0.071cm^2 area and 0.007cm^3 volume. The wound is limited to skin breakdown. There is no tunneling or undermining noted. There is a large amount of serous drainage noted. The wound margin is flat and intact. There is medium (34-66%) pink granulation within the wound bed. There is no necrotic tissue within the wound bed. The periwound skin appearance exhibited: Localized Edema, Maceration, Moist. Periwound temperature was noted as No Abnormality. The periwound has tenderness on palpation. Assessment Active Problems ICD-10 E11.622 - Type 2 diabetes mellitus with other skin ulcer E66.01 - Morbid (severe) obesity due to excess calories L02.415 -  Cutaneous abscess of right lower limb EUSTAQUIO, BOLOTIN. ([id number]) L97.222 - Non-pressure chronic ulcer of left calf with fat layer exposed I89.0 - Lymphedema, not elsewhere classified I50.40 - Unspecified combined systolic (congestive) and diastolic (congestive) heart failure F17.218 - Nicotine dependence, cigarettes, with other nicotine-induced disorders 49 year old gentleman who is morbidly obese, has diabetes mellitus which is not well controlled, congestive heart failure and lymphedema has multiple ulcerations of the right lower extremity have recently been treated at urgent care with antibiotics over the last 3 weeks. I have recommended: 1. Aquacel Ag over the wounds with a 3 layer compression to the right lower extremity and for him to continue wearing his compression stockings on the left lower extremity 2. Proper control of his diabetes by seeing his PCP as soon as possible and getting his blood sugars checked regularly which she has not been doing 3. Seen his cardiologist ASAP to do her appropriate workup which has not been done for a long while due to his noncompliance 4.  Inez Catalina giving up smoking and I have spent about 5 minutes discussing the risks benefits and alternatives of smoking cessation and I also discussed methodology which he should get in touch with his primary care doctor. He says he is going to be compliant. 5. Venous duplex studies of both lower extremities. 6. Regular visits to the wound care center and taking ownership of looking after his health and being compliant. Procedures Wound #2 Wound #2 is a To be determined located on the Right,Lateral,Superior Lower Leg . There was a Skin/Subcutaneous Tissue Debridement [id number]) debridement with total area of 1 sq cm performed by Christin Fudge, MD. with the following instrument(s): Curette to remove Viable and Non-Viable tissue/material including Fibrin/Slough, Eschar, Skin, and Subcutaneous after achieving pain  control using Lidocaine 5% topical ointment. A time out was conducted prior to the start of the procedure. A Minimum amount of bleeding was controlled with Pressure. The procedure was tolerated well with a pain level of 2 throughout and a pain level of 0 following the procedure. Post Debridement Measurements: 1cm length x 1cm width x 0.3cm depth; 0.236cm^3 volume. Post procedure Diagnosis Wound #2: Same as Pre-Procedure Wound #4 Wound #4 is a To be determined located on the Right,Distal Lower Leg . There was a Skin/Subcutaneous Tissue Debridement [id number]) debridement with total area of 1.95 sq cm performed by Christin Fudge, MD. with the following instrument(s): Curette to remove Viable and Non-Viable tissue/material including Fibrin/Slough, Eschar, Skin, and Subcutaneous after achieving pain control using Lidocaine 5% topical ointment. A time out was conducted prior to the start of the procedure. A Minimum amount of bleeding was controlled with Pressure. The procedure was tolerated well with a pain level of 2 throughout and a pain level ARJAY, HIBBERD. ([id number]) of 0 following the procedure. Post Debridement Measurements: 1.3cm length x 1.5cm width x 0.3cm depth; 0.459cm^3 volume. Post procedure Diagnosis Wound #4: Same as Pre-Procedure Wound #5 Wound #5 is a Pressure Ulcer located on the Right,Dorsal Lower Leg . There was a Skin/Subcutaneous Tissue Debridement [id number]) debridement with total area of 0.35 sq cm performed by Christin Fudge, MD. with the following instrument(s): Curette to remove Viable and Non-Viable tissue/material including Fibrin/Slough, Eschar, Skin, and Subcutaneous after achieving pain control using Lidocaine 5% topical ointment. A time out was conducted prior to the start of the procedure. A Minimum amount of bleeding was controlled with Pressure. The procedure was tolerated well with a pain level of 2 throughout and a pain level of 0 following the  procedure. Post Debridement Measurements: 0.5cm length x 0.7cm width x 0.3cm depth; 0.082cm^3 volume. Post debridement Stage noted as Category/Stage II. Post procedure Diagnosis Wound #5: Same as Pre-Procedure Wound #6 Wound #6 is a To be determined located on the Right,Dorsal,Inferior Lower Leg . There was a Skin/Subcutaneous Tissue Debridement [id number]) debridement with total area of 0.09 sq cm performed by Christin Fudge, MD. with the following instrument(s): Curette to remove Viable and Non-Viable tissue/material including Fibrin/Slough, Eschar, Skin, and Subcutaneous after achieving pain control using Lidocaine 5% topical ointment. A time out was conducted prior to the start of the procedure. A Minimum amount of bleeding was controlled with Pressure. The procedure was tolerated well with a pain level of 2 throughout and a pain level of 0 following the procedure. Post Debridement Measurements: 0.3cm length x 0.3cm width x 0.2cm depth; 0.014cm^3 volume. Post procedure Diagnosis Wound #6: Same as Pre-Procedure Plan Wound Cleansing: Wound #1 Right,Anterior Lower Leg: Clean wound with Normal Saline. Wound #2 Right,Lateral,Superior  Lower Leg: Clean wound with Normal Saline. Wound #3 Right,Medial Lower Leg: Clean wound with Normal Saline. Wound #4 Right,Distal Lower Leg: Clean wound with Normal Saline. Wound #5 Right,Dorsal Lower Leg: Clean wound with Normal Saline. Wound #6 Right,Dorsal,Inferior Lower Leg: Clean wound with Normal Saline. Anesthetic: Wound #1 Right,Anterior Lower Leg: PASQUAL, BANDY ([id number]) Topical Lidocaine 4% cream applied to wound bed prior to debridement Wound #2 Right,Lateral,Superior Lower Leg: Topical Lidocaine 4% cream applied to wound bed prior to debridement Wound #3 Right,Medial Lower Leg: Topical Lidocaine 4% cream applied to wound bed prior to debridement Wound #4 Right,Distal Lower Leg: Topical Lidocaine 4% cream applied to wound bed prior  to debridement Wound #5 Right,Dorsal Lower Leg: Topical Lidocaine 4% cream applied to wound bed prior to debridement Wound #6 Right,Dorsal,Inferior Lower Leg: Topical Lidocaine 4% cream applied to wound bed prior to debridement Primary Wound Dressing: Wound #1 Right,Anterior Lower Leg: Aquacel Ag Wound #2 Right,Lateral,Superior Lower Leg: Aquacel Ag Wound #3 Right,Medial Lower Leg: Aquacel Ag Wound #4 Right,Distal Lower Leg: Aquacel Ag Wound #5 Right,Dorsal Lower Leg: Aquacel Ag Wound #6 Right,Dorsal,Inferior Lower Leg: Aquacel Ag Secondary Dressing: Wound #1 Right,Anterior Lower Leg: Foam Wound #2 Right,Lateral,Superior Lower Leg: Foam Wound #3 Right,Medial Lower Leg: Foam Wound #4 Right,Distal Lower Leg: Foam Wound #5 Right,Dorsal Lower Leg: Foam Wound #6 Right,Dorsal,Inferior Lower Leg: Foam Dressing Change Frequency: Wound #1 Right,Anterior Lower Leg: Change dressing every week Wound #2 Right,Lateral,Superior Lower Leg: Change dressing every week Wound #3 Right,Medial Lower Leg: Change dressing every week Wound #4 Right,Distal Lower Leg: Change dressing every week Wound #5 Right,Dorsal Lower Leg: Change dressing every week Wound #6 Right,Dorsal,Inferior Lower Leg: Change dressing every week Follow-up Appointments: KHABIR, SCHETTINI ([id number]) Wound #1 Right,Anterior Lower Leg: Return Appointment in 1 week. Wound #2 Right,Lateral,Superior Lower Leg: Return Appointment in 1 week. Wound #3 Right,Medial Lower Leg: Return Appointment in 1 week. Wound #4 Right,Distal Lower Leg: Return Appointment in 1 week. Wound #5 Right,Dorsal Lower Leg: Return Appointment in 1 week. Wound #6 Right,Dorsal,Inferior Lower Leg: Return Appointment in 1 week. Edema Control: Wound #1 Right,Anterior Lower Leg: 3 Layer Compression System - Right Lower Extremity Wound #2 Right,Lateral,Superior Lower Leg: 3 Layer Compression System - Right Lower Extremity Wound #3 Right,Medial  Lower Leg: 3 Layer Compression System - Right Lower Extremity Wound #4 Right,Distal Lower Leg: 3 Layer Compression System - Right Lower Extremity Wound #5 Right,Dorsal Lower Leg: 3 Layer Compression System - Right Lower Extremity Wound #6 Right,Dorsal,Inferior Lower Leg: 3 Layer Compression System - Right Lower Extremity 49 year old gentleman who is morbidly obese, has diabetes mellitus which is not well controlled, congestive heart failure and lymphedema has multiple ulcerations of the right lower extremity have recently been treated at urgent care with antibiotics over the last 3 weeks. I have recommended: 1. Aquacel Ag over the wounds with a 3 layer compression to the right lower extremity and for him to continue wearing his compression stockings on the left lower extremity 2. Proper control of his diabetes by seeing his PCP as soon as possible and getting his blood sugars checked regularly which she has not been doing 3. Seen his cardiologist ASAP to do her appropriate workup which has not been done for a long while due to his noncompliance 4. Inez Catalina giving up smoking and I have spent about 5 minutes discussing the risks benefits and alternatives of smoking cessation and I also discussed methodology which he should get in touch with his primary care doctor. He says he  is going to be compliant. 5. Venous duplex studies of both lower extremities. 6. Regular visits to the wound care center and taking ownership of looking after his health and being compliant. GABRIEAL, STUPAR ([id number]) Electronic Signature(s) Signed: 01/03/2015 3:49:45 PM By: Christin Fudge MD, FACS Previous Signature: 01/01/2015 4:38:38 PM Version By: Christin Fudge MD, FACS Previous Signature: 01/01/2015 10:09:55 AM Version By: Christin Fudge MD, FACS Entered By: Christin Fudge on 01/03/2015 15:49:45 [name] ([id number]) -------------------------------------------------------------------------------- ROS/PFSH  Details Patient Name: [name] Date of Service: 01/01/2015 8:00 AM Medical Record Number: [id number] Patient Account Number: [id number] Date of Birth/Sex: 07/07/1965 (49 y.o. Male) Treating RN: Ahmed Prima Primary Care Physician: [name], [place] Other Clinician: Referring Physician: [name], MELINDA Treating Physician/Extender: Frann Rider in Treatment: 0 Wound History Do you currently have one or more open woundso Yes How many open wounds do you currently haveo 6 Approximately how long have you had your woundso 3 weeks How have you been treating your wound(s) until nowo peroxide, ointment, wrapping Has your wound(s) ever healed and then re-openedo Yes Have you had any lab work done in the past montho Yes Who ordered the lab work doneo unsure of what it was Have you tested positive for an antibiotic resistant organism (MRSA, No VRE)o Have you tested positive for osteomyelitis (bone infection)o No Have you had any tests for circulation on your legso No Have you had other problems associated with your woundso Infection, Swelling Eyes Complaints and Symptoms: Positive for: Glasses / Contacts - glass Negative for: Dry Eyes; Vision Changes Medical History: Negative for: Cataracts; Glaucoma; Optic Neuritis Hematologic/Lymphatic Complaints and Symptoms: Negative for: Bleeding / Clotting Disorders; Human Immunodeficiency Virus Medical History: Positive for: Lymphedema Negative for: Anemia; Hemophilia; Human Immunodeficiency Virus; Sickle Cell Disease Cardiovascular Complaints and Symptoms: Negative for: Chest pain; LE edema Medical History: Positive for: Congestive Heart Failure; Hypertension Negative for: Angina; Arrhythmia; Coronary Artery Disease; Deep Vein Thrombosis; Hypotension; TREQUON, PAONE ([id number]) Myocardial Infarction; Peripheral Arterial Disease; Peripheral Venous Disease; Phlebitis; Vasculitis Gastrointestinal Complaints and  Symptoms: Negative for: Frequent diarrhea; Nausea; Vomiting Medical History: Negative for: Cirrhosis ; Colitis; Crohnos; Hepatitis A; Hepatitis B; Hepatitis C Past Medical History Notes: GERD Endocrine Complaints and Symptoms: Negative for: Hepatitis; Thyroid disease; Polydypsia (Excessive Thirst) Medical History: Positive for: Type II Diabetes Negative for: Type I Diabetes Time with diabetes: 8 months Treated with: Insulin Blood sugar tested every day: No Immunological Complaints and Symptoms: Positive for: Itching - legs Negative for: Hives Medical History: Negative for: Lupus Erythematosus; Raynaudos; Scleroderma Integumentary (Skin) Complaints and Symptoms: Positive for: Wounds; Swelling Negative for: Bleeding or bruising tendency; Breakdown Medical History: Negative for: History of Burn; History of pressure wounds Musculoskeletal Complaints and Symptoms: Positive for: Muscle Pain; Muscle Weakness Medical History: Positive for: Gout Negative for: Rheumatoid Arthritis; Osteoarthritis; Osteomyelitis JOSEPHUS, CRUTHIRDS ([id number]) Psychiatric Complaints and Symptoms: Positive for: Anxiety Medical History: Negative for: Anorexia/bulimia; Confinement Anxiety Past Medical History Notes: depression Constitutional Symptoms (General Health) Complaints and Symptoms: No Complaints or Symptoms Ear/Nose/Mouth/Throat Complaints and Symptoms: No Complaints or Symptoms Respiratory Complaints and Symptoms: No Complaints or Symptoms Genitourinary Complaints and Symptoms: No Complaints or Symptoms Medical History: Past Medical History Notes: pt states that he has kidney problems d/t CHF Neurologic Complaints and Symptoms: No Complaints or Symptoms Oncologic Complaints and Symptoms: No Complaints or Symptoms Family and Social History Cancer: No; Diabetes: Yes - Mother; Heart Disease: No; Hereditary Spherocytosis: No; Hypertension: Yes - Mother, Father, Siblings; Kidney  Disease: No; Lung Disease:  No; Seizures: No; Stroke: No; Thyroid Problems: No; Tuberculosis: No; Current every day smoker; Marital Status - Divorced; Alcohol Use: Moderate; Drug Use: No History; Caffeine Use: Daily; Financial Concerns: Yes; Food, Clothing or Shelter Needs: No; Support System Lacking: No; Transportation Concerns: No; Advanced Directives: No; Patient does not want information on Advanced Directives; Do not resuscitate: No; Living Will: No; Medical Power of Attorney: No TRILLION, RENICKER ([id number]) Physician Affirmation I have reviewed and agree with the above information. Electronic Signature(s) Signed: 01/01/2015 4:34:28 PM By: Christin Fudge MD, FACS Signed: 01/01/2015 6:00:56 PM By: Alric Quan Previous Signature: 01/01/2015 9:23:19 AM Version By: Christin Fudge MD, FACS Entered By: Alric Quan on 01/01/2015 10:09:33 [name] ([id number]) -------------------------------------------------------------------------------- SuperBill Details Patient Name: [name] Date of Service: 01/01/2015 Medical Record Number: [id number] Patient Account Number: [id number] Date of Birth/Sex: Jun 17, 1965 (49 y.o. Male) Treating RN: Ahmed Prima Primary Care Physician: [name], [place] Other Clinician: Referring Physician: [name], [place] Treating Physician/Extender: Frann Rider in Treatment: 0 Diagnosis Coding ICD-10 Codes Code Description E11.622 Type 2 diabetes mellitus with other skin ulcer E66.01 Morbid (severe) obesity due to excess calories L02.415 Cutaneous abscess of right lower limb L97.222 Non-pressure chronic ulcer of left calf with fat layer exposed I89.0 Lymphedema, not elsewhere classified I50.40 Unspecified combined systolic (congestive) and diastolic (congestive) heart failure F17.218 Nicotine dependence, cigarettes, with other nicotine-induced disorders Facility Procedures CPT4: Description Modifier Quantity Code [id number] - WOUND  CARE VISIT-LEV 3 EST PT 1 CPT4: [id number] - DEB SUBQ TISSUE 20 SQ CM/< 1 ICD-10 Description Diagnosis E11.622 Type 2 diabetes mellitus with other skin ulcer L97.222 Non-pressure chronic ulcer of left calf with fat layer exposed E66.01 Morbid (severe) obesity due to excess  calories I50.40 Unspecified combined systolic (congestive) and diastolic (congestive) heart failure CPT4: [id number] CESSATION 3-10MINS 1 ICD-10 Description Diagnosis F17.218 Nicotine dependence, cigarettes, with other nicotine-induced disorders Physician Procedures CPT4: Description Modifier Quantity Code [id number] - WC PHYS LEVEL 4 - NEW PT 25 1 Description DWYNE, VADNEY ([id number]) Electronic Signature(s) Signed: 01/01/2015 6:33:42 PM By: Gretta Cool, RN, BSN, Kim RN, BSN Signed: 01/02/2015 3:33:58 PM By: Christin Fudge MD, FACS Previous Signature: 01/01/2015 11:08:21 AM Version By: Christin Fudge MD, FACS Previous Signature: 01/01/2015 10:10:41 AM Version By: Christin Fudge MD, FACS Entered By: Gretta Cool RN, BSN, Kim on 01/01/2015 18:17:59

## 2015-01-02 ENCOUNTER — Telehealth: Payer: Self-pay

## 2015-01-02 NOTE — Telephone Encounter (Signed)
Needs an appt

## 2015-01-02 NOTE — Progress Notes (Signed)
TRASK, VOSLER ([id number]) Visit Report for 01/01/2015 Allergy List Details Patient Name: [name], [name] Date of Service: 01/01/2015 8:00 AM Medical Record Number: [id number] Patient Account Number: [id number] Date of Birth/Sex: April 07, 1965 (49 y.o. Male) Treating RN: Ahmed Prima Primary Care Physician: [name], [place] Other Clinician: Referring Physician: Rudie Meyer Treating Physician/Extender: Frann Rider in Treatment: 0 Allergies Active Allergies bulk chemical Reaction: nose bleeds, coughing Severity: Severe Allergy Notes Electronic Signature(s) Signed: 01/01/2015 6:00:56 PM By: Alric Quan Entered By: Alric Quan on 01/01/2015 09:10:09 [name] ([id number]) -------------------------------------------------------------------------------- [place] Information Details Patient Name: [name] Date of Service: 01/01/2015 8:00 AM Medical Record Number: [id number] Patient Account Number: [id number] Date of Birth/Sex: 02/03/1965 (49 y.o. Male) Treating RN: Ahmed Prima Primary Care Physician: [name], [place] Other Clinician: Referring Physician: Rudie Meyer Treating Physician/Extender: Frann Rider in Treatment: 0 Visit Information Patient Arrived: Ambulatory Arrival Time: 08:11 Accompanied By: self Transfer Assistance: None Patient Identification Verified: Yes Secondary Verification Process Yes Completed: Patient Requires Transmission-Based No Precautions: Patient Has Alerts: Yes Patient Alerts: DM II Electronic Signature(s) Signed: 01/01/2015 6:00:56 PM By: Alric Quan Entered By: Alric Quan on 01/01/2015 08:13:16 [name] ([id number]) -------------------------------------------------------------------------------- Clinic Level of Care Assessment Details Patient Name: [name] Date of Service: 01/01/2015 8:00 AM Medical Record Number: [id number] Patient Account Number:  [id number] Date of Birth/Sex: 06-Oct-1965 (49 y.o. Male) Treating RN: Cornell Barman Primary Care Physician: [name], [facility] Other Clinician: Referring Physician: Rudie Meyer Treating Physician/Extender: Frann Rider in Treatment: 0 Clinic Level of Care Assessment Items TOOL 1 Quantity Score _0  - Use when EandM and Procedure is performed on INITIAL visit 0 ASSESSMENTS - Nursing Assessment / Reassessment X - General Physical Exam (combine w/ comprehensive assessment (listed just 1 20 below) when performed on new pt. evals) X - Comprehensive Assessment (HX, ROS, Risk Assessments, Wounds Hx, etc.) 1 25 ASSESSMENTS - Wound and Skin Assessment / Reassessment _1  - Dermatologic / Skin Assessment (not related to wound area) 0 ASSESSMENTS - Ostomy and/or Continence Assessment and Care _2  - Incontinence Assessment and Management 0 _3  - Ostomy Care Assessment and Management (repouching, etc.) 0 PROCESS - Coordination of Care _4  - Simple Patient / Family Education for ongoing care 0 X - Complex (extensive) Patient / Family Education for ongoing care 1 20 X - Staff obtains Programmer, systems, Records, Test Results / Process Orders 1 10 _5  - Staff telephones HHA, Nursing Homes / Clarify orders / etc 0 _6  - Routine Transfer to another Facility (non-emergent condition) 0 _7  - Routine Hospital Admission (non-emergent condition) 0 X - New Admissions / Biomedical engineer / Ordering NPWT, Apligraf, etc. 1 15 _8  - Emergency Hospital Admission (emergent condition) 0 PROCESS - Special Needs _9  - Pediatric / Minor Patient Management 0 _10  - Isolation Patient Management 0 LANSING, SIGMON. ([id number]) _11  - Hearing / Language / Visual special needs 0 _12  - Assessment of Community assistance (transportation, D/C planning, etc.) 0 _13  - Additional assistance / Altered mentation 0 _14  - Support Surface(s) Assessment (bed, cushion, seat, etc.) 0 INTERVENTIONS - Miscellaneous _15  - External ear exam 0 _16  -  Patient Transfer (multiple staff / Civil Service fast streamer / Similar devices) 0 _17  - Simple Staple / Suture removal (25 or less) 0 _18  - Complex Staple / Suture removal (26 or more) 0 _19  - Hypo/Hyperglycemic Management (do not check if billed separately) 0 X - Ankle / Brachial Index (ABI) - do not check if billed separately 1 15 Has the patient been seen at  the hospital within the last three years: Yes Total Score: 105 Level Of Care: New/Established - Level 3 Electronic Signature(s) Signed: 01/01/2015 6:33:42 PM By: Gretta Cool, RN, BSN, Kim RN, BSN Entered By: Gretta Cool, RN, BSN, Kim on 01/01/2015 18:17:49 [name] ([id number]) -------------------------------------------------------------------------------- Encounter Discharge Information Details Patient Name: [name] Date of Service: 01/01/2015 8:00 AM Medical Record Number: [id number] Patient Account Number: [id number] Date of Birth/Sex: 10/03/1965 (49 y.o. Male) Treating RN: Ahmed Prima Primary Care Physician: [name], [place] Other Clinician: Referring Physician: Rudie Meyer Treating Physician/Extender: Frann Rider in Treatment: 0 Encounter Discharge Information Items Discharge Pain Level: 0 Discharge Condition: Stable Ambulatory Status: Ambulatory Discharge Destination: Home Transportation: Private Auto Accompanied By: delf Schedule Follow-up Appointment: Yes Medication Reconciliation completed and provided to Patient/Care Yes Gwin Eagon: Provided on Clinical Summary of Care: 01/01/2015 Form Type Recipient Paper Patient RT Electronic Signature(s) Signed: 01/01/2015 10:13:43 AM By: Ruthine Dose Entered By: Ruthine Dose on 01/01/2015 10:13:42 [name] ([id number]) -------------------------------------------------------------------------------- Lower Extremity Assessment Details Patient Name: [name] Date of Service: 01/01/2015 8:00 AM Medical Record Number: [id number] Patient Account Number:  [id number] Date of Birth/Sex: Aug 08, 1965 (49 y.o. Male) Treating RN: Ahmed Prima Primary Care Physician: [name], [place] Other Clinician: Referring Physician: Rudie Meyer Treating Physician/Extender: Frann Rider in Treatment: 0 Edema Assessment Assessed: [Left: No] [Right: No] Edema: [Left: Yes] [Right: Yes] Calf Left: Right: Point of Measurement: 34 cm From Medial Instep cm 52 cm Ankle Left: Right: Point of Measurement: 9 cm From Medial Instep cm 31.5 cm Vascular Assessment Pulses: Posterior Tibial Palpable: [Left:Yes] [Right:Yes] Doppler: [Left:Monophasic] [Right:Monophasic] Dorsalis Pedis Palpable: [Left:Yes] [Right:Yes] Doppler: [Left:Monophasic] [Right:Monophasic] Extremity colors, hair growth, and conditions: Extremity Color: [Left:Normal] [Right:Normal] Hair Growth on Extremity: [Left:Yes] [Right:Yes] Temperature of Extremity: [Left:Warm] [Right:Warm] Capillary Refill: [Left:< 3 seconds] [Right:< 3 seconds] Blood Pressure: Brachial: [Left:160] [Right:140] Dorsalis Pedis: 152 [Left:Dorsalis Pedis: 150] Ankle: Posterior Tibial: 150 [Left:Posterior Tibial: 152 0.95] [Right:0.95] Toe Nail Assessment Left: Right: Thick: No No Discolored: No No Deformed: No No Improper Length and Hygiene: No No ABB, GOBERT ([id number]) Electronic Signature(s) Signed: 01/01/2015 6:00:56 PM By: Alric Quan Entered By: Alric Quan on 01/01/2015 08:44:23 [name] ([id number]) -------------------------------------------------------------------------------- Multi Wound Chart Details Patient Name: [name] Date of Service: 01/01/2015 8:00 AM Medical Record Number: [id number] Patient Account Number: [id number] Date of Birth/Sex: 1965-05-23 (49 y.o. Male) Treating RN: Ahmed Prima Primary Care Physician: [name], [place] Other Clinician: Referring Physician: Rudie Meyer Treating Physician/Extender: Frann Rider in Treatment:  0 Vital Signs Height(in): 71 Pulse(bpm): 84 Weight(lbs): 360 Blood Pressure 167/104 (mmHg): Body Mass Index(BMI): 50 Temperature(F): 97.9 Respiratory Rate 20 (breaths/min): Photos: [1:No Photos] [2:No Photos] [3:No Photos] Wound Location: [1:Right Lower Leg - Anterior Right Lower Leg - Lateral,] [2:Superior] [3:Right Lower Leg - Medial] Wounding Event: [1:Gradually Appeared] [2:Gradually Appeared] [3:Gradually Appeared] Primary Etiology: [1:To be determined] [2:To be determined] [3:To be determined] Date Acquired: [1:12/13/2014] [2:12/13/2014] [3:12/13/2014] Weeks of Treatment: [1:0] [2:0] [3:0] Wound Status: [1:Open] [2:Open] [3:Open] Measurements L x W x D 0.5x1x0.1 [2:1x1x0.2] [3:2x1.2x0.2] (cm) Area (cm) : [1:0.393] [2:0.785] [3:1.885] Volume (cm) : [1:0.039] [2:0.157] [3:0.377] Classification: [1:Partial Thickness] [2:Full Thickness Without Exposed Support Structures] [3:Full Thickness Without Exposed Support Structures] Exudate Amount: [1:Small] [2:Medium] [3:Medium] Exudate Type: [1:Serosanguineous] [2:Serosanguineous] [3:Serosanguineous] Exudate Color: [1:red, brown] [2:red, brown] [3:red, brown] Wound Margin: [1:Flat and Intact] [2:Flat and Intact] [3:Flat and Intact] Granulation Amount: [1:Large (67-100%)] [2:None Present (0%)] [3:None Present (0%)] Granulation Quality: [1:Red] [2:N/A] [3:N/A] Necrotic Amount: [1:None Present (0%)] [2:Large (67-100%)] [3:Large (  67-100%)] Exposed Structures: [1:Fascia: No Fat: No Tendon: No Muscle: No Joint: No Bone: No Limited to Skin Breakdown] [2:Fascia: No Fat: No Tendon: No Muscle: No Joint: No Bone: No Limited to Skin Breakdown] [3:N/A] Epithelialization: None None None Periwound Skin Texture: No Abnormalities Noted Edema: Yes Edema: Yes Periwound Skin Moist: Yes Moist: Yes Moist: Yes Moisture: Periwound Skin Color: No Abnormalities Noted No Abnormalities Noted No Abnormalities Noted Temperature: No Abnormality No  Abnormality N/A Tenderness on Yes Yes Yes Palpation: Wound Preparation: Ulcer Cleansing: Ulcer Cleansing: Ulcer Cleansing: Rinsed/Irrigated with Rinsed/Irrigated with Rinsed/Irrigated with Saline Saline Saline Topical Anesthetic Topical Anesthetic Topical Anesthetic Applied: Other: lidocaine Applied: Other: lidocaine Applied: Other: lidocaine 4% 4% 4% Wound Number: _0 Photos: No Photos No Photos No Photos Wound Location: Right Lower Leg - Distal Right Lower Leg - Dorsal Right Lower Leg - Dorsal, Inferior Wounding Event: Gradually Appeared Gradually Appeared Blister Primary Etiology: To be determined Pressure Ulcer To be determined Date Acquired: 12/13/2014 12/13/2014 12/13/2014 Weeks of Treatment: 0 0 0 Wound Status: Open Open Open Measurements L x W x D 1.3x1.5x0.2 0.5x0.7x0.1 0.3x0.3x0.1 (cm) Area (cm) : 1.532 0.275 0.071 Volume (cm) : 0.306 0.027 0.007 Classification: Full Thickness Without Category/Stage II Partial Thickness Exposed Support Structures Exudate Amount: Medium None Present Large Exudate Type: Serosanguineous N/A Serous Exudate Color: red, brown N/A amber Wound Margin: N/A Flat and Intact Flat and Intact Granulation Amount: None Present (0%) None Present (0%) Medium (34-66%) Granulation Quality: N/A N/A Pink Necrotic Amount: Large (67-100%) Large (67-100%) None Present (0%) Exposed Structures: Fascia: No Fascia: No Fascia: No Fat: No Fat: No Fat: No Tendon: No Tendon: No Tendon: No Muscle: No Muscle: No Muscle: No Joint: No Joint: No Joint: No Bone: No Bone: No Bone: No Limited to Skin Limited to Skin Limited to Skin Breakdown Breakdown Breakdown Epithelialization: None None None Periwound Skin Texture: Edema: Yes Edema: Yes Edema: Yes KEITA, [place] ([id number]) Periwound Skin Moist: Yes Moist: Yes Maceration: Yes Moisture: Moist: Yes Periwound Skin Color: No Abnormalities Noted No Abnormalities Noted No Abnormalities  Noted Temperature: No Abnormality No Abnormality No Abnormality Tenderness on Yes Yes Yes Palpation: Wound Preparation: Ulcer Cleansing: Ulcer Cleansing: Ulcer Cleansing: Rinsed/Irrigated with Rinsed/Irrigated with Rinsed/Irrigated with Saline Saline Saline Topical Anesthetic Topical Anesthetic Topical Anesthetic Applied: Other: lidocaine Applied: Other: lidocaine Applied: Other: lidocaine 4% 4% 4% Treatment Notes Electronic Signature(s) Signed: 01/01/2015 6:00:56 PM By: Alric Quan Entered By: Alric Quan on 01/01/2015 09:24:58 [name] ([id number]) -------------------------------------------------------------------------------- [place] Details Patient Name: [name] Date of Service: 01/01/2015 8:00 AM Medical Record Number: [id number] Patient Account Number: [id number] Date of Birth/Sex: 04-11-1965 (49 y.o. Male) Treating RN: Ahmed Prima Primary Care Physician: [name], [place] Other Clinician: Referring Physician: Rudie Meyer Treating Physician/Extender: Frann Rider in Treatment: 0 Active Inactive Abuse / Safety / Falls / Self Care Management Nursing Diagnoses: Potential for falls Goals: Patient will remain injury free Date Initiated: 01/01/2015 Goal Status: Active Patient/caregiver will verbalize/demonstrate measures taken to prevent injury and/or falls Date Initiated: 01/01/2015 Goal Status: Active Interventions: Assess fall risk on admission and as needed Notes: Nutrition Nursing Diagnoses: Imbalanced nutrition Goals: Patient/caregiver agrees to and verbalizes understanding of need to use nutritional supplements and/or vitamins as prescribed Date Initiated: 01/01/2015 Goal Status: Active Patient/caregiver verbalizes understanding of need to maintain therapeutic glucose control per primary care physician Date Initiated: 01/01/2015 Goal Status: Active Patient/caregiver will maintain therapeutic glucose  control Date Initiated: 01/01/2015 Goal Status: Active Interventions: LOUIS, IVERY ([id number])  Provide education on elevated blood sugars and impact on wound healing Provide education on nutrition Notes: Orientation to the Wound Care Program Nursing Diagnoses: Knowledge deficit related to the wound healing center program Goals: Patient/caregiver will verbalize understanding of the [place] [facility] Date Initiated: 01/01/2015 Goal Status: Active Interventions: Provide education on orientation to the wound center Notes: Pain, Acute or Chronic Nursing Diagnoses: Pain, acute or chronic: actual or potential Goals: Patient will verbalize adequate pain control and receive pain control interventions during procedures as needed Date Initiated: 01/01/2015 Goal Status: Active Patient/caregiver will verbalize adequate pain control between visits Date Initiated: 01/01/2015 Goal Status: Active Patient/caregiver will verbalize comfort level met Date Initiated: 01/01/2015 Goal Status: Active Interventions: Assess comfort goal upon admission Complete pain assessment as per visit requirements Notes: Wound/Skin Impairment Nursing Diagnoses: Impaired tissue integrity MIKIAH, DEMOND ([id number]) Knowledge deficit related to smoking impact on wound healing Knowledge deficit related to ulceration/compromised skin integrity Goals: Patient will demonstrate a reduced rate of smoking or cessation of smoking Date Initiated: 01/01/2015 Goal Status: Active Patient/caregiver will verbalize understanding of skin care regimen Date Initiated: 01/01/2015 Goal Status: Active Ulcer/skin breakdown will have a volume reduction of 30% by week 4 Date Initiated: 01/01/2015 Goal Status: Active Ulcer/skin breakdown will have a volume reduction of 50% by week 8 Date Initiated: 01/01/2015 Goal Status: Active Ulcer/skin breakdown will have a volume reduction of 80% by week 12 Date Initiated:  01/01/2015 Goal Status: Active Interventions: Assess patient/caregiver ability to obtain necessary supplies Assess patient/caregiver ability to perform ulcer/skin care regimen upon admission and as needed Notes: Electronic Signature(s) Signed: 01/01/2015 6:00:56 PM By: Alric Quan Entered By: Alric Quan on 01/01/2015 09:24:46 [name] ([id number]) -------------------------------------------------------------------------------- Pain Assessment Details Patient Name: [name] Date of Service: 01/01/2015 8:00 AM Medical Record Number: [id number] Patient Account Number: [id number] Date of Birth/Sex: 11-Mar-1965 (49 y.o. Male) Treating RN: Ahmed Prima Primary Care Physician: [name], [place] Other Clinician: Referring Physician: Rudie Meyer Treating Physician/Extender: Frann Rider in Treatment: 0 Active Problems Location of Pain Severity and Description of Pain Patient Has Paino No Site Locations With Dressing Change: No Pain Management and Medication Current Pain Management: Electronic Signature(s) Signed: 01/01/2015 6:00:56 PM By: Alric Quan Entered By: Alric Quan on 01/01/2015 08:13:41 [name] ([id number]) -------------------------------------------------------------------------------- Patient/Caregiver Education Details Patient Name: [name] Date of Service: 01/01/2015 8:00 AM Medical Record Number: [id number] Patient Account Number: [id number] Date of Birth/Gender: 1965/07/05 (49 y.o. Male) Treating RN: Ahmed Prima Primary Care Physician: [name], [place] Other Clinician: Referring Physician: Rudie Meyer Treating Physician/Extender: Frann Rider in Treatment: 0 Education Assessment Education Provided To: Patient Education Topics Provided Wound/Skin Impairment: Handouts: Other: keep wrap clean and dry Methods: Demonstration, Explain/Verbal Responses: State content correctly Electronic  Signature(s) Signed: 01/01/2015 6:00:56 PM By: Alric Quan Entered By: Alric Quan on 01/01/2015 10:13:08 [name] ([id number]) -------------------------------------------------------------------------------- Wound Assessment Details Patient Name: [name] Date of Service: 01/01/2015 8:00 AM Medical Record Number: [id number] Patient Account Number: [id number] Date of Birth/Sex: 10/26/1965 (49 y.o. Male) Treating RN: Ahmed Prima Primary Care Physician: [name], [place] Other Clinician: Referring Physician: Rudie Meyer Treating Physician/Extender: Frann Rider in Treatment: 0 Wound Status Wound Number: 1 Primary Etiology: To be determined Wound Location: Right Lower Leg - Anterior Wound Status: Open Wounding Event: Gradually Appeared Date Acquired: 12/13/2014 Weeks Of Treatment: 0 Clustered Wound: No Photos Photo Uploaded By: Alric Quan on 01/01/2015 17:42:00 Wound Measurements Length: (cm) 0.5 Width: (cm) 1 Depth: (cm) 0.1 Area: (cm)  0.393 Volume: (cm) 0.039 % Reduction in Area: % Reduction in Volume: Epithelialization: None Tunneling: No Undermining: No Wound Description Classification: Partial Thickness Wound Margin: Flat and Intact Exudate Amount: Small Exudate Type: Serosanguineous Exudate Color: red, brown Foul Odor After Cleansing: No Wound Bed Granulation Amount: Large (67-100%) Exposed Structure Granulation Quality: Red Fascia Exposed: No Necrotic Amount: None Present (0%) Fat Layer Exposed: No Tendon Exposed: No JULEN, RUBERT ([id number]) Muscle Exposed: No Joint Exposed: No Bone Exposed: No Limited to Skin Breakdown Periwound Skin Texture Texture Color No Abnormalities Noted: Yes No Abnormalities Noted: No Moisture Temperature / Pain No Abnormalities Noted: No Temperature: No Abnormality Moist: Yes Tenderness on Palpation: Yes Wound Preparation Ulcer Cleansing: Rinsed/Irrigated with  Saline Topical Anesthetic Applied: Other: lidocaine 4%, Treatment Notes Wound #1 (Right, Anterior Lower Leg) 1. Cleansed with: Clean wound with Normal Saline 2. Anesthetic Topical Lidocaine 4% cream to wound bed prior to debridement 4. Dressing Applied: Aquacel Ag 5. Secondary Dressing Applied Foam 7. Secured with 3 Layer Compression System - Right Lower Extremity Electronic Signature(s) Signed: 01/01/2015 6:00:56 PM By: Alric Quan Entered By: Alric Quan on 01/01/2015 08:52:51 [name] ([id number]) -------------------------------------------------------------------------------- Wound Assessment Details Patient Name: [name] Date of Service: 01/01/2015 8:00 AM Medical Record Number: [id number] Patient Account Number: [id number] Date of Birth/Sex: [birth year] (49 y.o. Male) Treating RN: Ahmed Prima Primary Care Physician: [name], [place] Other Clinician: Referring Physician: Rudie Meyer Treating Physician/Extender: Frann Rider in Treatment: 0 Wound Status Wound Number: 2 Primary Etiology: To be determined Wound Location: Right Lower Leg - Lateral, Wound Status: Open Superior Wounding Event: Gradually Appeared Date Acquired: 12/13/2014 Weeks Of Treatment: 0 Clustered Wound: No Photos Photo Uploaded By: Alric Quan on 01/01/2015 17:42:00 Wound Measurements Length: (cm) 1 Width: (cm) 1 Depth: (cm) 0.2 Area: (cm) 0.785 Volume: (cm) 0.157 % Reduction in Area: % Reduction in Volume: Epithelialization: None Tunneling: No Undermining: No Wound Description Full Thickness Without Exposed Classification: Support Structures Wound Margin: Flat and Intact Exudate Medium Amount: Exudate Type: Serosanguineous Exudate Color: red, brown Foul Odor After Cleansing: No Wound Bed Granulation Amount: None Present (0%) Exposed Structure Necrotic Amount: Large (67-100%) Fascia Exposed: No CAS, TRACZ  ([id number]) Necrotic Quality: Adherent Slough Fat Layer Exposed: No Tendon Exposed: No Muscle Exposed: No Joint Exposed: No Bone Exposed: No Limited to Skin Breakdown Periwound Skin Texture Texture Color No Abnormalities Noted: No No Abnormalities Noted: No Localized Edema: Yes Temperature / Pain Moisture Temperature: No Abnormality No Abnormalities Noted: No Tenderness on Palpation: Yes Moist: Yes Wound Preparation Ulcer Cleansing: Rinsed/Irrigated with Saline Topical Anesthetic Applied: Other: lidocaine 4%, Treatment Notes Wound #2 (Right, Lateral, Superior Lower Leg) 1. Cleansed with: Clean wound with Normal Saline 2. Anesthetic Topical Lidocaine 4% cream to wound bed prior to debridement 4. Dressing Applied: Aquacel Ag 5. Secondary Dressing Applied Foam 7. Secured with 3 Layer Compression System - Right Lower Extremity Electronic Signature(s) Signed: 01/01/2015 6:00:56 PM By: Alric Quan Entered By: Alric Quan on 01/01/2015 08:55:47 [name] ([id number]) -------------------------------------------------------------------------------- Wound Assessment Details Patient Name: [name] Date of Service: 01/01/2015 8:00 AM Medical Record Number: [id number] Patient Account Number: [id number] Date of Birth/Sex: [birth year] (49 y.o. Male) Treating RN: Ahmed Prima Primary Care Physician: [name], [place] Other Clinician: Referring Physician: Rudie Meyer Treating Physician/Extender: Frann Rider in Treatment: 0 Wound Status Wound Number: 3 Primary Etiology: To be determined Wound Location: Right Lower Leg - Medial Wound Status: Open Wounding Event: Gradually Appeared Date Acquired: 12/13/2014 Weeks Of Treatment:  0 Clustered Wound: No Photos Photo Uploaded By: Alric Quan on 01/01/2015 17:45:20 Wound Measurements Length: (cm) 2 Width: (cm) 1.2 Depth: (cm) 0.2 Area: (cm) 1.885 Volume: (cm) 0.377 % Reduction in  Area: % Reduction in Volume: Epithelialization: None Tunneling: No Undermining: No Wound Description Full Thickness Without Exposed Classification: Support Structures Wound Margin: Flat and Intact Exudate Medium Amount: Exudate Type: Serosanguineous Exudate Color: red, brown Foul Odor After Cleansing: No Wound Bed Granulation Amount: None Present (0%) Necrotic Amount: Large (67-100%) Necrotic Quality: Adherent [address] [place] ([id number]) Periwound Skin Texture Texture Color No Abnormalities Noted: No No Abnormalities Noted: No Localized Edema: Yes Temperature / Pain Moisture Tenderness on Palpation: Yes No Abnormalities Noted: No Moist: Yes Wound Preparation Ulcer Cleansing: Rinsed/Irrigated with Saline Topical Anesthetic Applied: Other: lidocaine 4%, Treatment Notes Wound #3 (Right, Medial Lower Leg) 1. Cleansed with: Clean wound with Normal Saline 2. Anesthetic Topical Lidocaine 4% cream to wound bed prior to debridement 4. Dressing Applied: Aquacel Ag 5. Secondary Dressing Applied Foam 7. Secured with 3 Layer Compression System - Right Lower Extremity Electronic Signature(s) Signed: 01/01/2015 6:00:56 PM By: Alric Quan Entered By: Alric Quan on 01/01/2015 08:58:24 [name] ([id number]) -------------------------------------------------------------------------------- Wound Assessment Details Patient Name: [name] Date of Service: 01/01/2015 8:00 AM Medical Record Number: [id number] Patient Account Number: [id number] Date of Birth/Sex: Oct 03, 1965 (49 y.o. Male) Treating RN: Ahmed Prima Primary Care Physician: [name], [place] Other Clinician: Referring Physician: Rudie Meyer Treating Physician/Extender: Frann Rider in Treatment: 0 Wound Status Wound Number: 4 Primary Etiology: To be determined Wound Location: Right Lower Leg - Distal Wound Status: Open Wounding Event: Gradually Appeared Date  Acquired: 12/13/2014 Weeks Of Treatment: 0 Clustered Wound: No Photos Photo Uploaded By: Alric Quan on 01/01/2015 17:45:20 Wound Measurements Length: (cm) 1.3 Width: (cm) 1.5 Depth: (cm) 0.2 Area: (cm) 1.532 Volume: (cm) 0.306 % Reduction in Area: % Reduction in Volume: Epithelialization: None Tunneling: No Undermining: No Wound Description Full Thickness Without Exposed Foul Odor Aft Classification: Support Structures Exudate Medium Amount: Exudate Type: Serosanguineous Exudate Color: red, brown er Cleansing: No Wound Bed Granulation Amount: None Present (0%) Exposed Structure Necrotic Amount: Large (67-100%) Fascia Exposed: No Necrotic Quality: Adherent Slough Fat Layer Exposed: No Tendon Exposed: No QUARAN, KEDZIERSKI ([id number]) Muscle Exposed: No Joint Exposed: No Bone Exposed: No Limited to Skin Breakdown Periwound Skin Texture Texture Color No Abnormalities Noted: No No Abnormalities Noted: No Localized Edema: Yes Temperature / Pain Moisture Temperature: No Abnormality No Abnormalities Noted: No Tenderness on Palpation: Yes Moist: Yes Wound Preparation Ulcer Cleansing: Rinsed/Irrigated with Saline Topical Anesthetic Applied: Other: lidocaine 4%, Treatment Notes Wound #4 (Right, Distal Lower Leg) 1. Cleansed with: Clean wound with Normal Saline 2. Anesthetic Topical Lidocaine 4% cream to wound bed prior to debridement 4. Dressing Applied: Aquacel Ag 5. Secondary Dressing Applied Foam 7. Secured with 3 Layer Compression System - Right Lower Extremity Electronic Signature(s) Signed: 01/01/2015 6:00:56 PM By: Alric Quan Entered By: Alric Quan on 01/01/2015 09:00:27 [name] ([id number]) -------------------------------------------------------------------------------- Wound Assessment Details Patient Name: [name] Date of Service: 01/01/2015 8:00 AM Medical Record Number: [id number] Patient Account Number:  [id number] Date of Birth/Sex: [birth year] (49 y.o. Male) Treating RN: Ahmed Prima Primary Care Physician: [name], [place] Other Clinician: Referring Physician: Rudie Meyer Treating Physician/Extender: Frann Rider in Treatment: 0 Wound Status Wound Number: 5 Primary Etiology: Pressure Ulcer Wound Location: Right Lower Leg - Dorsal Wound Status: Open Wounding Event: Gradually Appeared Date Acquired: 12/13/2014 Weeks Of Treatment:  0 Clustered Wound: No Photos Photo Uploaded By: Alric Quan on 01/01/2015 17:43:30 Wound Measurements Length: (cm) 0.5 Width: (cm) 0.7 Depth: (cm) 0.1 Area: (cm) 0.275 Volume: (cm) 0.027 % Reduction in Area: % Reduction in Volume: Epithelialization: None Tunneling: No Undermining: No Wound Description Classification: Category/Stage II Wound Margin: Flat and Intact Exudate Amount: None Present Foul Odor After Cleansing: No Wound Bed Granulation Amount: None Present (0%) Exposed Structure Necrotic Amount: Large (67-100%) Fascia Exposed: No Necrotic Quality: Adherent Slough Fat Layer Exposed: No Tendon Exposed: No Muscle Exposed: No Joint Exposed: No HENDRICK, PAVICH ([id number]) Bone Exposed: No Limited to Skin Breakdown Periwound Skin Texture Texture Color No Abnormalities Noted: No No Abnormalities Noted: No Localized Edema: Yes Temperature / Pain Moisture Temperature: No Abnormality No Abnormalities Noted: No Tenderness on Palpation: Yes Moist: Yes Wound Preparation Ulcer Cleansing: Rinsed/Irrigated with Saline Topical Anesthetic Applied: Other: lidocaine 4%, Treatment Notes Wound #5 (Right, Dorsal Lower Leg) 1. Cleansed with: Clean wound with Normal Saline 2. Anesthetic Topical Lidocaine 4% cream to wound bed prior to debridement 4. Dressing Applied: Aquacel Ag 5. Secondary Dressing Applied Foam 7. Secured with 3 Layer Compression System - Right Lower Extremity Electronic Signature(s) Signed:  01/01/2015 6:00:56 PM By: Alric Quan Entered By: Alric Quan on 01/01/2015 09:03:17 [name] ([id number]) -------------------------------------------------------------------------------- Wound Assessment Details Patient Name: [name] Date of Service: 01/01/2015 8:00 AM Medical Record Number: [id number] Patient Account Number: [id number] Date of Birth/Sex: December 07, 1965 (49 y.o. Male) Treating RN: Ahmed Prima Primary Care Physician: [name], [place] Other Clinician: Referring Physician: Rudie Meyer Treating Physician/Extender: Frann Rider in Treatment: 0 Wound Status Wound Number: 6 Primary To be determined Etiology: Wound Location: Right Lower Leg - Dorsal, Inferior Wound Open Status: Wounding Event: Blister Comorbid Lymphedema, Congestive Heart Date Acquired: 12/13/2014 History: Failure, Hypertension, Type II Weeks Of Treatment: 0 Diabetes, Gout Clustered Wound: No Photos Wound Measurements Length: (cm) 0.3 Width: (cm) 0.3 Depth: (cm) 0.1 Area: (cm) 0.071 Volume: (cm) 0.007 % Reduction in Area: 0% % Reduction in Volume: 0% Epithelialization: None Tunneling: No Undermining: No Wound Description Classification: Partial Thickness Foul Odor A Diabetic Severity (Wagner): Grade 1 Wound Margin: Flat and Intact Exudate Amount: Large Exudate Type: Serous Exudate Color: amber fter Cleansing: No Wound Bed Granulation Amount: Medium (34-66%) Exposed Structure Granulation Quality: Pink Fascia Exposed: No Necrotic Amount: None Present (0%) Fat Layer Exposed: No ELZA, SORTOR ([id number]) Tendon Exposed: No Muscle Exposed: No Joint Exposed: No Bone Exposed: No Limited to Skin Breakdown Periwound Skin Texture Texture Color No Abnormalities Noted: No No Abnormalities Noted: No Localized Edema: Yes Temperature / Pain Moisture Temperature: No Abnormality No Abnormalities Noted: No Tenderness on Palpation: Yes Maceration:  Yes Moist: Yes Wound Preparation Ulcer Cleansing: Rinsed/Irrigated with Saline Topical Anesthetic Applied: Other: lidocaine 4%, Treatment Notes Wound #6 (Right, Dorsal, Inferior Lower Leg) 1. Cleansed with: Clean wound with Normal Saline 2. Anesthetic Topical Lidocaine 4% cream to wound bed prior to debridement 4. Dressing Applied: Aquacel Ag 5. Secondary Dressing Applied Foam 7. Secured with 3 Layer Compression System - Right Lower Extremity Electronic Signature(s) Signed: 01/01/2015 6:00:56 PM By: Alric Quan Entered By: Alric Quan on 01/01/2015 17:44:34 [name] ([id number]) -------------------------------------------------------------------------------- [place] Details Patient Name: [name] Date of Service: 01/01/2015 8:00 AM Medical Record Number: [id number] Patient Account Number: [id number] Date of Birth/Sex: [birth year] (49 y.o. Male) Treating RN: Ahmed Prima Primary Care Physician: [name], [place] Other Clinician: Referring Physician: Rudie Meyer Treating Physician/Extender: Frann Rider in Treatment: 0 Vital  Signs Time Taken: 08:13 Temperature (F): 97.9 Height (in): 71 Pulse (bpm): 84 Source: Stated Respiratory Rate (breaths/min): 20 Weight (lbs): 360 Blood Pressure (mmHg): 167/104 Source: Stated Reference Range: 80 - 120 mg / dl Body Mass Index (BMI): 50.2 Electronic Signature(s) Signed: 01/01/2015 6:00:56 PM By: Alric Quan Entered By: Alric Quan on 01/01/2015 08:18:26

## 2015-01-02 NOTE — Telephone Encounter (Signed)
He's having trouble sleeping, wants to know what he can take.

## 2015-01-02 NOTE — Progress Notes (Signed)
AMER, EDKIN ([id number]) Visit Report for 01/01/2015 Abuse/Suicide Risk Screen Details Patient Name: [name]. Date of Service: 01/01/2015 8:00 AM Medical Record Number: [id number] Patient Account Number: [id number] Date of Birth/Sex: Jul 13, 1965 (49 y.o. Male) Treating RN: [name] Primary Care Physician: [name] Other Clinician: Referring Physician: Rudie Fitzgerald Treating Physician/Extender: [name] in Treatment: 0 Abuse/Suicide Risk Screen Items Answer ABUSE/SUICIDE RISK SCREEN: Has anyone close to you tried to hurt or harm you recentlyo No Do you feel uncomfortable with anyone in your familyo No Has anyone forced you do things that you didnot want to doo No Do you have any thoughts of harming yourselfo No Patient displays signs or symptoms of abuse and/or neglect. No Electronic Signature(s) Signed: 01/01/2015 6:00:56 PM By: Alric Quan Entered By: Alric Quan on 01/01/2015 09:20:17 [name] ([id number]) -------------------------------------------------------------------------------- Activities of Daily Living Details Patient Name: [name] Date of Service: 01/01/2015 8:00 AM Medical Record Number: [id number] Patient Account Number: [id number] Date of Birth/Sex: 04/11/1965 (49 y.o. Male) Treating RN: [name] Primary Care Physician: [name], [place] Other Clinician: Referring Physician: Rudie Fitzgerald Treating Physician/Extender: [name] in Treatment: 0 Activities of Daily Living Items Answer Activities of Daily Living (Please select one for each item) Drive Automobile Completely Able Take Medications Completely Able Use Telephone Completely Able Care for Appearance Completely Able Use Toilet Completely Able Bath / Shower Completely Able Dress Self Completely Able Feed Self Completely Able Walk Completely Able Get In / Out Bed Completely Able Housework Completely Able Prepare Meals  Completely [place] for Self Completely Able Electronic Signature(s) Signed: 01/01/2015 6:00:56 PM By: Alric Quan Entered By: Alric Quan on 01/01/2015 09:20:46 [name] ([id number]) -------------------------------------------------------------------------------- Education Assessment Details Patient Name: [name] Date of Service: 01/01/2015 8:00 AM Medical Record Number: [id number] Patient Account Number: [id number] Date of Birth/Sex: February 08, 1965 (49 y.o. Male) Treating RN: [name] Primary Care Physician: [name], [place] Other Clinician: Referring Physician: Rudie Fitzgerald Treating Physician/Extender: [name] in Treatment: 0 Learning Preferences/Education Level/Primary Language Learning Preference: Explanation, Printed Material Highest Education Level: High School Preferred Language: English Cognitive Barrier Assessment/Beliefs Language Barrier: No Translator Needed: No Memory Deficit: No Emotional Barrier: No Cultural/Religious Beliefs Affecting Medical No Care: Physical Barrier Assessment Impaired Vision: Yes Glasses Impaired Hearing: No Decreased Hand dexterity: No Knowledge/Comprehension Assessment Knowledge Level: Medium Comprehension Level: Medium Ability to understand written Medium instructions: Ability to understand verbal Medium instructions: Motivation Assessment Anxiety Level: Anxious Cooperation: Cooperative Education Importance: Acknowledges Need Interest in Health Problems: Asks Questions Perception: Coherent Willingness to Engage in Self- Medium Management Activities: Readiness to Engage in Self- Medium Management Activities: Electronic Signature(s) Signed: 01/01/2015 6:00:56 PM By: [name] ([id number]) Entered By: Alric Quan on 01/01/2015 09:21:31 [name]  ([id number]) -------------------------------------------------------------------------------- Fall Risk Assessment Details Patient Name: [name] Date of Service: 01/01/2015 8:00 AM Medical Record Number: [id number] Patient Account Number: [id number] Date of Birth/Sex: Jul 23, 1965 (49 y.o. Male) Treating RN: [name] Primary Care Physician: [name], [place] Other Clinician: Referring Physician: Rudie Fitzgerald Treating Physician/Extender: [name] in Treatment: 0 Fall Risk Assessment Items Have you had 2 or more falls in the last 12 monthso 0 No Have you had any fall that resulted in injury in the last 12 monthso 0 No FALL RISK ASSESSMENT: History of falling - immediate or within 3 months 0 No Secondary diagnosis 0 No Ambulatory aid None/bed rest/wheelchair/nurse 0 No Crutches/cane/walker 0 No Furniture 0 No IV  Access/Saline Lock 0 No Gait/Training Normal/bed rest/immobile 0 No Weak 0 No Impaired 0 No Mental Status Oriented to own ability 0 No Electronic Signature(s) Signed: 01/01/2015 6:00:56 PM By: Alric Quan Entered By: Alric Quan on 01/01/2015 09:21:50 [name] ([id number]) -------------------------------------------------------------------------------- Foot Assessment Details Patient Name: [name] Date of Service: 01/01/2015 8:00 AM Medical Record Number: [id number] Patient Account Number: [id number] Date of Birth/Sex: [birth year] (49 y.o. Male) Treating RN: [name] Primary Care Physician: [name], [place] Other Clinician: Referring Physician: Rudie Fitzgerald Treating Physician/Extender: [name] in Treatment: 0 Foot Assessment Items Site Locations + = Sensation present, - = Sensation absent, C = Callus, U = Ulcer R = Redness, W = Warmth, M = Maceration, PU = Pre-ulcerative lesion F = Fissure, S = Swelling, D = Dryness Assessment Right: Left: Other Deformity: No No Prior Foot Ulcer: No  No Prior Amputation: No No Charcot Joint: No No Ambulatory Status: Gait: Electronic Signature(s) Signed: 01/01/2015 6:00:56 PM By: Alric Quan Entered By: Alric Quan on 01/01/2015 09:22:46 [name] ([id number]) -------------------------------------------------------------------------------- Nutrition Risk Assessment Details Patient Name: [name] Date of Service: 01/01/2015 8:00 AM Medical Record Number: [id number] Patient Account Number: [id number] Date of Birth/Sex: August 20, 1965 (49 y.o. Male) Treating RN: [name] Primary Care Physician: [name], [place] Other Clinician: Referring Physician: Rudie Fitzgerald Treating Physician/Extender: [name] in Treatment: 0 Height (in): 71 Weight (lbs): 360 Body Mass Index (BMI): 50.2 Nutrition Risk Assessment Items NUTRITION RISK SCREEN: I have an illness or condition that made me change the kind and/or 0 No amount of food I eat I eat fewer than two meals per day 0 No I eat few fruits and vegetables, or milk products 0 No I have three or more drinks of beer, liquor or wine almost every day 0 No I have tooth or mouth problems that make it hard for me to eat 0 No I don't always have enough money to buy the food I need 0 No I eat alone most of the time 1 Yes I take three or more different prescribed or over-the-counter drugs a 0 No day Without wanting to, I have lost or gained 10 pounds in the last six 2 Yes months I am not always physically able to shop, cook and/or feed myself 0 No Nutrition Protocols Good Risk Protocol Moderate Risk Protocol Electronic Signature(s) Signed: 01/01/2015 6:00:56 PM By: Alric Quan Entered By: Alric Quan on 01/01/2015 [id number]

## 2015-01-03 DIAGNOSIS — E11622 Type 2 diabetes mellitus with other skin ulcer: Secondary | ICD-10-CM | POA: Diagnosis not present

## 2015-01-04 NOTE — Telephone Encounter (Signed)
Patient notified

## 2015-01-05 NOTE — Progress Notes (Signed)
SABATINO, PEABODY ([id number]) Visit Report for 01/03/2015 Physician Orders Details Patient Name: [name], [name] Date of Service: 01/03/2015 8:00 AM Medical Record Number: [id number] Patient Account Number: [id number] Date of Birth/Sex: 21-Oct-1965 (49 y.o. Male) Treating RN: Cornell Barman Primary Care Physician: [name], [facility] Other Clinician: Referring Physician: Rudie Meyer Treating Physician/Extender: Frann Rider in Treatment: 0 Verbal / Phone Orders: Yes Clinician: Cornell Barman Read Back and Verified: Yes Diagnosis Coding Wound Cleansing Wound #1 Right,Anterior Lower Leg o Clean wound with Normal Saline. Wound #2 Right,Lateral,Superior Lower Leg o Clean wound with Normal Saline. Wound #3 Right,Medial Lower Leg o Clean wound with Normal Saline. Wound #4 Right,Distal Lower Leg o Clean wound with Normal Saline. Wound #5 Right,Dorsal Lower Leg o Clean wound with Normal Saline. Wound #6 Right,Dorsal,Inferior Lower Leg o Clean wound with Normal Saline. Skin Barriers/Peri-Wound Care Wound #1 Right,Anterior Lower Leg o Moisturizing lotion Wound #2 Right,Lateral,Superior Lower Leg o Moisturizing lotion Wound #3 Right,Medial Lower Leg o Moisturizing lotion Wound #4 Right,Distal Lower Leg o Moisturizing lotion Wound #5 Right,Dorsal Lower Leg o Moisturizing lotion DEREN, CAZENAVE. ([id number]) Wound #6 Right,Dorsal,Inferior Lower Leg o Moisturizing lotion Primary Wound Dressing Wound #1 Right,Anterior Lower Leg o Aquacel Ag Wound #2 Right,Lateral,Superior Lower Leg o Aquacel Ag Wound #3 Right,Medial Lower Leg o Aquacel Ag Wound #4 Right,Distal Lower Leg o Aquacel Ag Wound #5 Right,Dorsal Lower Leg o Aquacel Ag Wound #6 Right,Dorsal,Inferior Lower Leg o Aquacel Ag Secondary Dressing Wound #1 Right,Anterior Lower Leg o Foam Wound #2 Right,Lateral,Superior Lower Leg o Foam Wound #3 Right,Medial Lower Leg o  Foam Wound #4 Right,Distal Lower Leg o Foam Wound #5 Right,Dorsal Lower Leg o Foam Wound #6 Right,Dorsal,Inferior Lower Leg o Foam Dressing Change Frequency Wound #1 Right,Anterior Lower Leg o Change dressing every week Wound #2 Right,Lateral,Superior Lower Leg o Change dressing every week SRINATH, BLACKMER. ([id number]) Wound #3 Right,Medial Lower Leg o Change dressing every week Wound #4 Right,Distal Lower Leg o Change dressing every week Wound #5 Right,Dorsal Lower Leg o Change dressing every week Wound #6 Right,Dorsal,Inferior Lower Leg o Change dressing every week Follow-up Appointments Wound #1 Right,Anterior Lower Leg o Return Appointment in 1 week. Wound #2 Right,Lateral,Superior Lower Leg o Return Appointment in 1 week. Wound #3 Right,Medial Lower Leg o Return Appointment in 1 week. Wound #4 Right,Distal Lower Leg o Return Appointment in 1 week. Wound #5 Right,Dorsal Lower Leg o Return Appointment in 1 week. Wound #6 Right,Dorsal,Inferior Lower Leg o Return Appointment in 1 week. Edema Control Wound #1 Right,Anterior Lower Leg o 3 Layer Compression System - Right Lower Extremity o Elevate legs to the level of the heart and pump ankles as often as possible Wound #2 Right,Lateral,Superior Lower Leg o 3 Layer Compression System - Right Lower Extremity o Elevate legs to the level of the heart and pump ankles as often as possible Wound #3 Right,Medial Lower Leg o 3 Layer Compression System - Right Lower Extremity o Elevate legs to the level of the heart and pump ankles as often as possible Wound #4 Right,Distal Lower Leg o 3 Layer Compression System - Right Lower Extremity o Elevate legs to the level of the heart and pump ankles as often as possible KIMARI, VONG. ([id number]) Wound #5 Right,Dorsal Lower Leg o 3 Layer Compression System - Right Lower Extremity o Elevate legs to the level of the heart and pump  ankles as often as possible Wound #6 Right,Dorsal,Inferior Lower Leg o 3 Layer Compression System - Right Lower Extremity   o Elevate legs to the level of the heart and pump ankles as often as possible Electronic Signature(s) Signed: 01/03/2015 3:39:43 PM By: Christin Fudge MD, FACS Signed: 01/04/2015 3:49:12 PM By: Gretta Cool, RN, BSN, Kim RN, BSN Entered By: Gretta Cool, RN, BSN, Kim on 01/03/2015 08:29:26 [name] ([id number]) -------------------------------------------------------------------------------- [place] Details Patient Name: [name] Date of Service: 01/03/2015 Medical Record Number: [id number] Patient Account Number: [id number] Date of Birth/Sex: [birth year] (49 y.o. Male) Treating RN: Cornell Barman Primary Care Physician: [name], [place] Other Clinician: Referring Physician: Rudie Meyer Treating Physician/Extender: Frann Rider in Treatment: 0 Diagnosis Coding ICD-10 Codes Code Description E11.622 Type 2 diabetes mellitus with other skin ulcer E66.01 Morbid (severe) obesity due to excess calories L02.415 Cutaneous abscess of right lower limb L97.222 Non-pressure chronic ulcer of left calf with fat layer exposed I89.0 Lymphedema, not elsewhere classified I50.40 Unspecified combined systolic (congestive) and diastolic (congestive) heart failure F17.218 Nicotine dependence, cigarettes, with other nicotine-induced disorders Facility Procedures CPT4: Description Modifier Quantity Code [id number] (Facility Use Only) [phone] - APPLY MULTLAY COMPRS LWR RT 1 LEG Electronic Signature(s) Signed: 01/03/2015 3:39:43 PM By: Christin Fudge MD, FACS Signed: 01/04/2015 3:49:12 PM By: Gretta Cool RN, BSN, Kim RN, BSN Entered By: Gretta Cool, RN, BSN, Kim on 01/03/2015 [id number]

## 2015-01-05 NOTE — Progress Notes (Signed)
KLEIN, STABLEIN ([id number]) Visit Report for 01/03/2015 Arrival Information Details Patient Name: [name] Date of Service: 01/03/2015 8:00 AM Medical Record Number: [id number] Patient Account Number: [id number] Date of Birth/Sex: Oct 31, 1965 (49 y.o. Male) Treating RN: Cornell Barman Primary Care Physician: [name], [facility] Other Clinician: Referring Physician: Rudie Meyer Treating Physician/Extender: Frann Rider in Treatment: 0 Visit Information History Since Last Visit Added or deleted any medications: No Patient Arrived: Ambulatory Any new allergies or adverse reactions: No Arrival Time: 08:02 Signs or symptoms of abuse/neglect since last No Accompanied By: self visito Transfer Assistance: None Hospitalized since last visit: No Patient Identification Verified: Yes Has Dressing in Place as Prescribed: Yes Secondary Verification Process Yes Pain Present Now: No Completed: Patient Requires Transmission-Based No Precautions: Patient Has Alerts: Yes Patient Alerts: DM II Electronic Signature(s) Signed: 01/04/2015 3:49:12 PM By: Gretta Cool, RN, BSN, Kim RN, BSN Entered By: Gretta Cool, RN, BSN, Kim on 01/03/2015 08:23:49 [name] ([id number]) -------------------------------------------------------------------------------- Encounter Discharge Information Details Patient Name: [name] Date of Service: 01/03/2015 8:00 AM Medical Record Number: [id number] Patient Account Number: [id number] Date of Birth/Sex: March 05, 1965 (49 y.o. Male) Treating RN: Cornell Barman Primary Care Physician: [name], [place] Other Clinician: Referring Physician: Rudie Meyer Treating Physician/Extender: Frann Rider in Treatment: 0 Encounter Discharge Information Items Discharge Pain Level: 0 Discharge Condition: Stable Ambulatory Status: Ambulatory Discharge Destination: Home Private Transportation: Auto Accompanied By: self Schedule Follow-up Appointment:  Yes Medication Reconciliation completed and Yes provided to Patient/Care Itzel Mckibbin: Clinical Summary of Care: Electronic Signature(s) Signed: 01/04/2015 3:49:12 PM By: Gretta Cool, RN, BSN, Kim RN, BSN Entered By: Gretta Cool, RN, BSN, Kim on 01/03/2015 08:25:52 [name] ([id number]) -------------------------------------------------------------------------------- Patient/Caregiver Education Details Patient Name: [name] Date of Service: 01/03/2015 8:00 AM Medical Record Number: [id number] Patient Account Number: [id number] Date of Birth/Gender: 13-Sep-1965 (49 y.o. Male) Treating RN: Cornell Barman Primary Care Physician: [name], [facility] Other Clinician: Referring Physician: Rudie Meyer Treating Physician/Extender: Frann Rider in Treatment: 0 Education Assessment Education Provided To: Patient Education Topics Provided Venous: Controlling Swelling with Multilayered Compression Wraps, Other: do not get wrap wet, if Handouts: slides more than 1 inch call to be re-wrapped Methods: Demonstration, Explain/Verbal Responses: State content correctly Wound/Skin Impairment: Electronic Signature(s) Signed: 01/04/2015 3:49:12 PM By: Gretta Cool, RN, BSN, Kim RN, BSN Entered By: Gretta Cool, RN, BSN, Kim on 01/03/2015 08:25:38

## 2015-01-08 ENCOUNTER — Encounter: Payer: Managed Care, Other (non HMO) | Admitting: Surgery

## 2015-01-08 DIAGNOSIS — E11622 Type 2 diabetes mellitus with other skin ulcer: Secondary | ICD-10-CM | POA: Diagnosis not present

## 2015-01-08 NOTE — Progress Notes (Addendum)
[name], [name] ([id number]) Visit Report for 01/08/2015 Chief Complaint Document Details Patient Name: [name], [name]. Date of Service: 01/08/2015 8:00 AM Medical Record Number: [id number] Patient Account Number: [id number] Date of Birth/Sex: [birth year] (49 y.o. Male) Treating RN: Montey Hora Primary Care Physician: [name], [facility] Other Clinician: Referring Physician: [name], MELINDA Treating Physician/Extender: Frann Rider in Treatment: 1 Information Obtained from: Patient Chief Complaint Patients presents for treatment of an open diabetic ulcer over the right lower extremity with swelling of both legs for about 1 month. Electronic Signature(s) Signed: 01/08/2015 8:55:36 AM By: Christin Fudge MD, FACS Entered By: Christin Fudge on 01/08/2015 08:55:36 [name] ([id number]) -------------------------------------------------------------------------------- HPI Details Patient Name: [name] Date of Service: 01/08/2015 8:00 AM Medical Record Number: [id number] Patient Account Number: [id number] Date of Birth/Sex: 1965-07-20 (49 y.o. Male) Treating RN: Montey Hora Primary Care Physician: [name], [place] Other Clinician: Referring Physician: [name], MELINDA Treating Physician/Extender: Frann Rider in Treatment: 1 History of Present Illness Location: Started getting pustules and boils on his right lower extremity along with swelling and was treated for this Quality: Patient reports experiencing a dull pain to affected area(s). Severity: Patient states wound are getting worse. Duration: Patient has had the wound for < 4 weeks prior to presenting for treatment Timing: Pain in wound is Intermittent (comes and goes Context: The wound appeared gradually over time Modifying Factors: Other treatment(s) tried include: local care and antibiotics and some compression stockings. Associated Signs and Symptoms: Patient reports having difficulty standing for  long periods. HPI Description: 49 year old gentleman who has type 2 diabetes mellitus and morbid obesity has been having cellulitis and abscess of the right lower leg for the last 3 weeks. He has had multiple antibiotics and an IandD and over the last 10 days has been on Septra DS and doxycycline and prior to that Keflex. the patient is a poor historian and does not give a definite timeline but says he's been diagnosed with diabetes mellitus for about 8-10 months and has been on insulin. He does not measure his blood sugars regularly nor does he see his PCP regularly. Also has congestive heart failure for which he sees a cardiologist at [facility] but this is going to change as the cardiologist as retired. He is a smoker and smokes several cigars a day and drinks alcohol occasionally. He has been morbidly obese for several years. 01/08/2015 -- the patient complains about having to use his 3 layer compression wrap and has multiple issues with this none of them being medically oriented. He also continues to smoke, has not yet seen his cardiologist or his PCP and in general has continued to not look after himself. Electronic Signature(s) Signed: 01/08/2015 8:56:22 AM By: Christin Fudge MD, FACS Entered By: Christin Fudge on 01/08/2015 08:56:22 [name] ([id number]) -------------------------------------------------------------------------------- Physical Exam Details Patient Name: [name] Date of Service: 01/08/2015 8:00 AM Medical Record Number: [id number] Patient Account Number: [id number] Date of Birth/Sex: 17-Jun-1965 (49 y.o. Male) Treating RN: Montey Hora Primary Care Physician: [name], [place] Other Clinician: Referring Physician: [name], MELINDA Treating Physician/Extender: Frann Rider in Treatment: 1 Constitutional . Pulse regular. Respirations normal and unlabored. Afebrile. . Eyes Nonicteric. Reactive to light. Ears, Nose, Mouth, and Throat Lips,  teeth, and gums WNL.[name] Kitchen Moist mucosa without lesions . Neck supple and nontender. No palpable supraclavicular or cervical adenopathy. Normal sized without goiter. Respiratory WNL. No retractions.. Cardiovascular Pedal Pulses WNL. No clubbing, cyanosis or edema. Lymphatic No adneopathy. No adenopathy. No adenopathy. Musculoskeletal  Adexa without tenderness or enlargement.. Digits and nails w/o clubbing, cyanosis, infection, petechiae, ischemia, or inflammatory conditions.. Integumentary (Hair, Skin) No suspicious lesions. No crepitus or fluctuance. No peri-wound warmth or erythema. No masses.[name] Kitchen Psychiatric Judgement and insight Intact.. No evidence of depression, anxiety, or agitation.. Notes the edema is a little better since last week and the wounds themselves are weeping less and have less subcutaneous debris. Electronic Signature(s) Signed: 01/08/2015 8:57:15 AM By: Christin Fudge MD, FACS Entered By: Christin Fudge on 01/08/2015 08:57:15 [name] ([name]) -------------------------------------------------------------------------------- Physician Orders Details Patient Name: [name] Date of Service: 01/08/2015 8:00 AM Medical Record Number: [name] Patient Account Number: [id number] Date of Birth/Sex: 1965-07-15 (49 y.o. Male) Treating RN: Montey Hora Primary Care Physician: [name], [place] Other Clinician: Referring Physician: [name], MELINDA Treating Physician/Extender: Frann Rider in Treatment: 1 Verbal / Phone Orders: Yes Clinician: Dorthy, Joanna Read Back and Verified: Yes Diagnosis Coding ICD-10 Coding Code Description E11.622 Type 2 diabetes mellitus with other skin ulcer E66.01 Morbid (severe) obesity due to excess calories L02.415 Cutaneous abscess of right lower limb L97.222 Non-pressure chronic ulcer of left calf with fat layer exposed I89.0 Lymphedema, not elsewhere classified I50.40 Unspecified combined systolic (congestive)  and diastolic (congestive) heart failure F17.218 Nicotine dependence, cigarettes, with other nicotine-induced disorders Wound Cleansing Wound #2 Right,Lateral,Superior Lower Leg o Clean wound with Normal Saline. Wound #3 Right,Medial Lower Leg o Clean wound with Normal Saline. Wound #4 Right,Distal Lower Leg o Clean wound with Normal Saline. Wound #6 Right,Dorsal,Inferior Lower Leg o Clean wound with Normal Saline. Skin Barriers/Peri-Wound Care Wound #2 Right,Lateral,Superior Lower Leg o Skin Prep Wound #3 Right,Medial Lower Leg o Skin Prep Wound #4 Right,Distal Lower Leg o Skin Prep Wound #6 Right,Dorsal,Inferior Lower Leg o Skin Prep TENZIN, DICKEL ([name]) Primary Wound Dressing Wound #2 Right,Lateral,Superior Lower Leg o Aquacel Ag Wound #3 Right,Medial Lower Leg o Aquacel Ag Wound #4 Right,Distal Lower Leg o Aquacel Ag Wound #6 Right,Dorsal,Inferior Lower Leg o Aquacel Ag Secondary Dressing Wound #2 Right,Lateral,Superior Lower Leg o Boardered Foam Dressing Wound #3 Right,Medial Lower Leg o Boardered Foam Dressing Wound #4 Right,Distal Lower Leg o Boardered Foam Dressing Wound #6 Right,Dorsal,Inferior Lower Leg o Boardered Foam Dressing Dressing Change Frequency Wound #2 Right,Lateral,Superior Lower Leg o Change dressing every other day. Wound #3 Right,Medial Lower Leg o Change dressing every other day. Wound #4 Right,Distal Lower Leg o Change dressing every other day. Wound #6 Right,Dorsal,Inferior Lower Leg o Change dressing every other day. Follow-up Appointments Wound #2 Right,Lateral,Superior Lower Leg o Return Appointment in 1 week. Wound #3 Right,Medial Lower Leg o Return Appointment in 1 week. Wound #4 Right,Distal Lower Leg o Return Appointment in 1 week. AVNER, MARCHI ([name]) Wound #6 Right,Dorsal,Inferior Lower Leg o Return Appointment in 1 week. Edema Control Wound #2  Right,Lateral,Superior Lower Leg o Patient to wear own compression stockings o Elevate legs to the level of the heart and pump ankles as often as possible Wound #3 Right,Medial Lower Leg o Patient to wear own compression stockings o Elevate legs to the level of the heart and pump ankles as often as possible Wound #4 Right,Distal Lower Leg o Patient to wear own compression stockings o Elevate legs to the level of the heart and pump ankles as often as possible Wound #6 Right,Dorsal,Inferior Lower Leg o Patient to wear own compression stockings o Elevate legs to the level of the heart and pump ankles as often as possible Electronic Signature(s) Signed: 01/08/2015 2:14:04 PM By: Christin Fudge  MD, FACS Signed: 01/08/2015 4:09:06 PM By: Montey Hora Entered By: Montey Hora on 01/08/2015 09:05:48 [name] ([name]) -------------------------------------------------------------------------------- Problem List Details Patient Name: [name] Date of Service: 01/08/2015 8:00 AM Medical Record Number: [name] Patient Account Number: [id number] Date of Birth/Sex: Jan 11, 1966 (49 y.o. Male) Treating RN: Montey Hora Primary Care Physician: [name], MELINDA Other Clinician: Referring Physician: [name], [place] Treating Physician/Extender: Frann Rider in Treatment: 1 Active Problems ICD-10 Encounter Code Description Active Date Diagnosis E11.622 Type 2 diabetes mellitus with other skin ulcer 01/01/2015 Yes E66.01 Morbid (severe) obesity due to excess calories 01/01/2015 Yes L02.415 Cutaneous abscess of right lower limb 01/01/2015 Yes L97.222 Non-pressure chronic ulcer of left calf with fat layer 01/01/2015 Yes exposed I89.0 Lymphedema, not elsewhere classified 01/01/2015 Yes I50.40 Unspecified combined systolic (congestive) and diastolic 99991111 Yes (congestive) heart failure F17.218 Nicotine dependence, cigarettes, with other nicotine- 01/01/2015  Yes induced disorders Inactive Problems Resolved Problems Electronic Signature(s) Signed: 01/08/2015 8:55:29 AM By: Christin Fudge MD, FACS Entered By: Christin Fudge on 01/08/2015 08:55:29 [name] ([name]) -------------------------------------------------------------------------------- Progress Note Details Patient Name: [name] Date of Service: 01/08/2015 8:00 AM Medical Record Number: [name] Patient Account Number: [id number] Date of Birth/Sex: Oct 11, 1965 (49 y.o. Male) Treating RN: Montey Hora Primary Care Physician: [name], [place] Other Clinician: Referring Physician: [name], MELINDA Treating Physician/Extender: Frann Rider in Treatment: 1 Subjective Chief Complaint Information obtained from Patient Patients presents for treatment of an open diabetic ulcer over the right lower extremity with swelling of both legs for about 1 month. History of Present Illness (HPI) The following HPI elements were documented for the patient's wound: Location: Started getting pustules and boils on his right lower extremity along with swelling and was treated for this Quality: Patient reports experiencing a dull pain to affected area(s). Severity: Patient states wound are getting worse. Duration: Patient has had the wound for < 4 weeks prior to presenting for treatment Timing: Pain in wound is Intermittent (comes and goes Context: The wound appeared gradually over time Modifying Factors: Other treatment(s) tried include: local care and antibiotics and some compression stockings. Associated Signs and Symptoms: Patient reports having difficulty standing for long periods. 49 year old gentleman who has type 2 diabetes mellitus and morbid obesity has been having cellulitis and abscess of the right lower leg for the last 3 weeks. He has had multiple antibiotics and an IandD and over the last 10 days has been on Septra DS and doxycycline and prior to that  Keflex. the patient is a poor historian and does not give a definite timeline but says he's been diagnosed with diabetes mellitus for about 8-10 months and has been on insulin. He does not measure his blood sugars regularly nor does he see his PCP regularly. Also has congestive heart failure for which he sees a cardiologist at [facility] but this is going to change as the cardiologist as retired. He is a smoker and smokes several cigars a day and drinks alcohol occasionally. He has been morbidly obese for several years. 01/08/2015 -- the patient complains about having to use his 3 layer compression wrap and has multiple issues with this none of them being medically oriented. He also continues to smoke, has not yet seen his cardiologist or his PCP and in general has continued to not look after himself. Objective [name], [name] ([name]) Constitutional Pulse regular. Respirations normal and unlabored. Afebrile. Vitals Time Taken: 8:13 AM, Height: 71 in, Weight: 360 lbs, BMI: 50.2, Temperature: 98.3 F, Pulse: 89  bpm, Respiratory Rate: 20 breaths/min, Blood Pressure: 144/100 mmHg. Eyes Nonicteric. Reactive to light. Ears, Nose, Mouth, and Throat Lips, teeth, and gums WNL.[name] Kitchen Moist mucosa without lesions . Neck supple and nontender. No palpable supraclavicular or cervical adenopathy. Normal sized without goiter. Respiratory WNL. No retractions.. Cardiovascular Pedal Pulses WNL. No clubbing, cyanosis or edema. Lymphatic No adneopathy. No adenopathy. No adenopathy. Musculoskeletal Adexa without tenderness or enlargement.. Digits and nails w/o clubbing, cyanosis, infection, petechiae, ischemia, or inflammatory conditions.[name] Kitchen Psychiatric Judgement and insight Intact.. No evidence of depression, anxiety, or agitation.. General Notes: the edema is a little better since last week and the wounds themselves are weeping less and have less subcutaneous debris. Integumentary (Hair, Skin) No  suspicious lesions. No crepitus or fluctuance. No peri-wound warmth or erythema. No masses.. Wound #1 status is Open. Original cause of wound was Gradually Appeared. The wound is located on the Right,Anterior Lower Leg. The wound measures 0cm length x 0cm width x 0cm depth; 0cm^2 area and 0cm^3 volume. Wound #2 status is Open. Original cause of wound was Gradually Appeared. The wound is located on the Right,Lateral,Superior Lower Leg. The wound measures 1cm length x 1.4cm width x 0.2cm depth; 1.1cm^2 area and 0.22cm^3 volume. The wound is limited to skin breakdown. There is no tunneling or undermining noted. There is a medium amount of serosanguineous drainage noted. The wound margin is flat and intact. There is small (1-33%) granulation within the wound bed. There is a medium (34-66%) amount of necrotic tissue within the wound bed including Adherent Slough. The periwound skin appearance exhibited: Localized Edema, Moist. Periwound temperature was noted as No Abnormality. The periwound has MEADE, [name]. ([id number]) tenderness on palpation. Wound #3 status is Open. Original cause of wound was Gradually Appeared. The wound is located on the Right,Medial Lower Leg. The wound measures 2cm length x 1.5cm width x 0.2cm depth; 2.356cm^2 area and 0.471cm^3 volume. There is no tunneling or undermining noted. There is a medium amount of serosanguineous drainage noted. The wound margin is flat and intact. There is small (1-33%) red, pink granulation within the wound bed. There is a medium (34-66%) amount of necrotic tissue within the wound bed including Adherent Slough. The periwound skin appearance exhibited: Localized Edema, Moist. The periwound has tenderness on palpation. Wound #4 status is Open. Original cause of wound was Gradually Appeared. The wound is located on the Right,Distal Lower Leg. The wound measures 1.5cm length x 1.5cm width x 0.2cm depth; 1.767cm^2 area and 0.353cm^3 volume. The  wound is limited to skin breakdown. There is no tunneling or undermining noted. There is a medium amount of serosanguineous drainage noted. The wound margin is flat and intact. There is small (1-33%) red, pink granulation within the wound bed. There is a medium (34-66%) amount of necrotic tissue within the wound bed including Adherent Slough. The periwound skin appearance exhibited: Localized Edema, Moist. Periwound temperature was noted as No Abnormality. The periwound has tenderness on palpation. Wound #5 status is Open. Original cause of wound was Gradually Appeared. The wound is located on the Right,Dorsal Lower Leg. The wound measures 0cm length x 0cm width x 0cm depth; 0cm^2 area and 0cm^3 volume. Wound #6 status is Open. Original cause of wound was Blister. The wound is located on the Right,Dorsal,Inferior Lower Leg. The wound measures 0.2cm length x 0.3cm width x 0.1cm depth; 0.047cm^2 area and 0.005cm^3 volume. The wound is limited to skin breakdown. There is no tunneling or undermining noted. There is a large amount of serous drainage  noted. The wound margin is flat and intact. There is medium (34-66%) pink granulation within the wound bed. There is a medium (34-66%) amount of necrotic tissue within the wound bed including Adherent Slough. The periwound skin appearance exhibited: Localized Edema, Maceration, Moist. Periwound temperature was noted as No Abnormality. The periwound has tenderness on palpation. Assessment Active Problems ICD-10 E11.622 - Type 2 diabetes mellitus with other skin ulcer E66.01 - Morbid (severe) obesity due to excess calories L02.415 - Cutaneous abscess of right lower limb L97.222 - Non-pressure chronic ulcer of left calf with fat layer exposed I89.0 - Lymphedema, not elsewhere classified I50.40 - Unspecified combined systolic (congestive) and diastolic (congestive) heart failure F17.218 - Nicotine dependence, cigarettes, with other nicotine-induced  disorders [name], [name]. ([id number]) Plan Wound Cleansing: Wound #2 Right,Lateral,Superior Lower Leg: Clean wound with Normal Saline. Wound #3 Right,Medial Lower Leg: Clean wound with Normal Saline. Wound #4 Right,Distal Lower Leg: Clean wound with Normal Saline. Wound #6 Right,Dorsal,Inferior Lower Leg: Clean wound with Normal Saline. Skin Barriers/Peri-Wound Care: Wound #2 Right,Lateral,Superior Lower Leg: Skin Prep Wound #3 Right,Medial Lower Leg: Skin Prep Wound #4 Right,Distal Lower Leg: Skin Prep Wound #6 Right,Dorsal,Inferior Lower Leg: Skin Prep Primary Wound Dressing: Wound #2 Right,Lateral,Superior Lower Leg: Aquacel Ag Wound #3 Right,Medial Lower Leg: Aquacel Ag Wound #4 Right,Distal Lower Leg: Aquacel Ag Wound #6 Right,Dorsal,Inferior Lower Leg: Aquacel Ag Secondary Dressing: Wound #2 Right,Lateral,Superior Lower Leg: Boardered Foam Dressing Wound #3 Right,Medial Lower Leg: Boardered Foam Dressing Wound #4 Right,Distal Lower Leg: Boardered Foam Dressing Wound #6 Right,Dorsal,Inferior Lower Leg: Boardered Foam Dressing Dressing Change Frequency: Wound #2 Right,Lateral,Superior Lower Leg: Change dressing every other day. Wound #3 Right,Medial Lower Leg: Change dressing every other day. Wound #4 Right,Distal Lower Leg: Change dressing every other day. Wound #6 Right,Dorsal,Inferior Lower Leg: Change dressing every other day. [name], [name] ([id number]) Follow-up Appointments: Wound #2 Right,Lateral,Superior Lower Leg: Return Appointment in 1 week. Wound #3 Right,Medial Lower Leg: Return Appointment in 1 week. Wound #4 Right,Distal Lower Leg: Return Appointment in 1 week. Wound #6 Right,Dorsal,Inferior Lower Leg: Return Appointment in 1 week. Edema Control: Wound #2 Right,Lateral,Superior Lower Leg: Patient to wear own compression stockings Elevate legs to the level of the heart and pump ankles as often as possible Wound #3 Right,Medial  Lower Leg: Patient to wear own compression stockings Elevate legs to the level of the heart and pump ankles as often as possible Wound #4 Right,Distal Lower Leg: Patient to wear own compression stockings Elevate legs to the level of the heart and pump ankles as often as possible Wound #6 Right,Dorsal,Inferior Lower Leg: Patient to wear own compression stockings Elevate legs to the level of the heart and pump ankles as often as possible I have recommended: 1. Aquacel Ag over the wounds with a 3 layer compression to the right lower extremity and for him to continue wearing his compression stockings on the left lower extremity. I have spent a great deal of time explaining to him the reason for the 3 layer compression but the patient says he is got too many psychosocial issues and may not be able to tolerate this. 2. Proper control of his diabetes by seeing his PCP as soon as possible and getting his blood sugars checked regularly which she has not been doing 3. Seen his cardiologist ASAP to do her appropriate workup which has not been done for a long while due to his noncompliance. he has an appointment later this week 4. giving up smoking. 5. Venous duplex studies  of both lower extremities.Scheduled for January 4 6. Regular visits to the wound care center and taking ownership of looking after his health and being compliant. Electronic Signature(s) Signed: 01/08/2015 3:04:38 PM By: Christin Fudge MD, FACS Previous Signature: 01/08/2015 8:59:08 AM Version By: Christin Fudge MD, FACS Entered By: Christin Fudge on 01/08/2015 15:04:38 [name] ([id number]) -------------------------------------------------------------------------------- SuperBill Details Patient Name: [name] Date of Service: 01/08/2015 Medical Record Number: [id number] Patient Account Number: [id number] Date of Birth/Sex: 23-Aug-1965 (49 y.o. Male) Treating RN: Montey Hora Primary Care Physician: [name],  [place] Other Clinician: Referring Physician: [name], [place] Treating Physician/Extender: Frann Rider in Treatment: 1 Diagnosis Coding ICD-10 Codes Code Description E11.622 Type 2 diabetes mellitus with other skin ulcer E66.01 Morbid (severe) obesity due to excess calories L02.415 Cutaneous abscess of right lower limb L97.222 Non-pressure chronic ulcer of left calf with fat layer exposed I89.0 Lymphedema, not elsewhere classified I50.40 Unspecified combined systolic (congestive) and diastolic (congestive) heart failure F17.218 Nicotine dependence, cigarettes, with other nicotine-induced disorders Facility Procedures CPT4 Code: [id number] Description: [id number] - WOUND CARE VISIT-LEV 5 EST PT Modifier: Quantity: 1 Physician Procedures CPT4 Code: [id number] Description: [id number] - WC PHYS LEVEL 3 - EST PT ICD-10 Description Diagnosis E11.622 Type 2 diabetes mellitus with other skin ulcer E66.01 Morbid (severe) obesity due to excess calories L97.222 Non-pressure chronic ulcer of left calf with fat I89.0  Lymphedema, not elsewhere classified Modifier: layer exposed Quantity: 1 Electronic Signature(s) Signed: 01/08/2015 3:00:54 PM By: Montey Hora Signed: 01/08/2015 3:06:45 PM By: Christin Fudge MD, FACS Previous Signature: 01/08/2015 8:59:43 AM Version By: Christin Fudge MD, FACS Previous Signature: 01/08/2015 8:59:24 AM Version By: Christin Fudge MD, FACS Entered By: Montey Hora on 01/08/2015 15:00:53

## 2015-01-09 NOTE — Progress Notes (Signed)
[name], [name] ([id number]) Visit Report for 01/08/2015 Arrival Information Details Patient Name: [name], [name]. Date of Service: 01/08/2015 8:00 AM Medical Record Number: [id number] Patient Account Number: [id number] Date of Birth/Sex: [birth year] (49 y.o. Male) Treating RN: Montey Hora Primary Care Physician: [name], [place] Other Clinician: Referring Physician: [name], [place] Treating Physician/Extender: Frann Rider in Treatment: 1 Visit Information History Since Last Visit Added or deleted any medications: No Patient Arrived: Ambulatory Any new allergies or adverse reactions: No Arrival Time: 08:12 Had a fall or experienced change in No Accompanied By: self activities of daily living that may affect Transfer Assistance: None risk of falls: Patient Identification Verified: Yes Signs or symptoms of abuse/neglect since last No Secondary Verification Process Yes visito Completed: Hospitalized since last visit: No Patient Requires Transmission-Based No Pain Present Now: No Precautions: Patient Has Alerts: Yes Patient Alerts: DM II Electronic Signature(s) Signed: 01/08/2015 4:09:06 PM By: Montey Hora Entered By: Montey Hora on 01/08/2015 08:13:08 [name] ([id number]) -------------------------------------------------------------------------------- Clinic Level of Care Assessment Details Patient Name: [name] Date of Service: 01/08/2015 8:00 AM Medical Record Number: [id number] Patient Account Number: [id number] Date of Birth/Sex: 03/29/1965 (49 y.o. Male) Treating RN: Montey Hora Primary Care Physician: [name], [place] Other Clinician: Referring Physician: [name], MELINDA Treating Physician/Extender: Frann Rider in Treatment: 1 Clinic Level of Care Assessment Items TOOL 4 Quantity Score [] - Use when only an EandM is performed on FOLLOW-UP visit 0 ASSESSMENTS - Nursing Assessment / Reassessment X - Reassessment of  Co-morbidities (includes updates in patient status) 1 10 X - Reassessment of Adherence to Treatment Plan 1 5 ASSESSMENTS - Wound and Skin Assessment / Reassessment [] - Simple Wound Assessment / Reassessment - one wound 0 X - Complex Wound Assessment / Reassessment - multiple wounds 4 5 [] - Dermatologic / Skin Assessment (not related to wound area) 0 ASSESSMENTS - Focused Assessment X - Circumferential Edema Measurements - multi extremities 1 5 [] - Nutritional Assessment / Counseling / Intervention 0 X - Lower Extremity Assessment (monofilament, tuning fork, pulses) 1 5 [] - Peripheral Arterial Disease Assessment (using hand held doppler) 0 ASSESSMENTS - Ostomy and/or Continence Assessment and Care [] - Incontinence Assessment and Management 0 [] - Ostomy Care Assessment and Management (repouching, etc.) 0 PROCESS - Coordination of Care X - Simple Patient / Family Education for ongoing care 1 15 [] - Complex (extensive) Patient / Family Education for ongoing care 0 [] - Staff obtains Programmer, systems, Records, Test Results / Process Orders 0 [] - Staff telephones HHA, Nursing Homes / Clarify orders / etc 0 [] - Routine Transfer to another Facility (non-emergent condition) 0 ARYEH, [place] ([id number]) [] - Routine Hospital Admission (non-emergent condition) 0 [] - New Admissions / Biomedical engineer / Ordering NPWT, Apligraf, etc. 0 [] - Emergency Hospital Admission (emergent condition) 0 X - Simple Discharge Coordination 1 10 [] - Complex (extensive) Discharge Coordination 0 PROCESS - Special Needs [] - Pediatric / Minor Patient Management 0 [] - Isolation Patient Management 0 [] - Hearing / Language / Visual special needs 0 [] - Assessment of Community assistance (transportation, D/C planning, etc.) 0 [] - Additional assistance / Altered mentation 0 [] - Support Surface(s) Assessment (bed, cushion, seat, etc.) 0 INTERVENTIONS - Wound Cleansing / Measurement [] - Simple Wound  Cleansing - one wound 0 X - Complex Wound Cleansing - multiple wounds 4 5 X - Wound Imaging (photographs - any number of wounds) 1 5 [] - Wound Tracing (  instead of photographs) 0 [] - Simple Wound Measurement - one wound 0 X - Complex Wound Measurement - multiple wounds 4 5 INTERVENTIONS - Wound Dressings X - Small Wound Dressing one or multiple wounds 4 10 [] - Medium Wound Dressing one or multiple wounds 0 [] - Large Wound Dressing one or multiple wounds 0 [] - Application of Medications - topical 0 [] - Application of Medications - injection 0 INTERVENTIONS - Miscellaneous [] - External ear exam 0 MARCOANTONIO, [place]GA[place] ([id number]) [] - Specimen Collection (cultures, biopsies, blood, body fluids, etc.) 0 [] - Specimen(s) / Culture(s) sent or taken to Lab for analysis 0 [] - Patient Transfer (multiple staff / Harrel Lemon Lift / Similar devices) 0 [] - Simple Staple / Suture removal (25 or less) 0 [] - Complex Staple / Suture removal (26 or more) 0 [] - Hypo / Hyperglycemic Management (close monitor of Blood Glucose) 0 [] - Ankle / Brachial Index (ABI) - do not check if billed separately 0 X - Vital Signs 1 5 Has the patient been seen at the hospital within the last three years: Yes Total Score: 160 Level Of Care: New/Established - Level 5 Electronic Signature(s) Signed: 01/08/2015 4:09:06 PM By: Montey Hora Entered By: Montey Hora on 01/08/2015 09:06:35 [name] ([id number]) -------------------------------------------------------------------------------- Encounter Discharge Information Details Patient Name: [name] Date of Service: 01/08/2015 8:00 AM Medical Record Number: [id number] Patient Account Number: [id number] Date of Birth/Sex: 1965-03-23 (49 y.o. Male) Treating RN: Montey Hora Primary Care Physician: [name], MELINDA Other Clinician: Referring Physician: [name], MELINDA Treating Physician/Extender: Frann Rider in Treatment: 1 Encounter  Discharge Information Items Discharge Pain Level: 0 Discharge Condition: Stable Ambulatory Status: Ambulatory Discharge Destination: Home Transportation: Private Auto Accompanied By: self Schedule Follow-up Appointment: Yes Medication Reconciliation completed and provided to Patient/Care No : Provided on Clinical Summary of Care: 01/08/2015 Form Type Recipient Paper Patient RT Electronic Signature(s) Signed: 01/08/2015 9:24:21 AM By: Ruthine Dose Entered By: Ruthine Dose on 01/08/2015 09:24:21 [name] ([id number]) -------------------------------------------------------------------------------- Lower Extremity Assessment Details Patient Name: [name] Date of Service: 01/08/2015 8:00 AM Medical Record Number: [id number] Patient Account Number: [id number] Date of Birth/Sex: [birth year] (49 y.o. Male) Treating RN: Montey Hora Primary Care Physician: [name], MELINDA Other Clinician: Referring Physician: [name], MELINDA Treating Physician/Extender: Frann Rider in Treatment: 1 Edema Assessment Assessed: [Left: No] [Right: No] Edema: [Left: Ye] [Right: s] Calf Left: Right: Point of Measurement: 34 cm From Medial Instep cm 52 cm Ankle Left: Right: Point of Measurement: 9 cm From Medial Instep cm 32 cm Vascular Assessment Pulses: Posterior Tibial Dorsalis Pedis Palpable: [Right:Yes] Extremity colors, hair growth, and conditions: Extremity Color: [Right:Normal] Hair Growth on Extremity: [Right:No] Temperature of Extremity: [Right:Warm] Capillary Refill: [Right:< 3 seconds] Electronic Signature(s) Signed: 01/08/2015 4:09:06 PM By: Montey Hora Entered By: Montey Hora on 01/08/2015 08:30:26 [name] ([id number]) -------------------------------------------------------------------------------- Multi Wound Chart Details Patient Name: [name] Date of Service: 01/08/2015 8:00 AM Medical Record Number:  [id number] Patient Account Number: [id number] Date of Birth/Sex: 09-27-1965 (49 y.o. Male) Treating RN: Montey Hora Primary Care Physician: [name], MELINDA Other Clinician: Referring Physician: [name], MELINDA Treating Physician/Extender: Frann Rider in Treatment: 1 Vital Signs Height(in): 71 Pulse(bpm): 89 Weight(lbs): 360 Blood Pressure 144/100 (mmHg): Body Mass Index(BMI): 50 Temperature(F): 98.3 Respiratory Rate 20 (breaths/min): Photos: [1:No Photos] [2:No Photos] [3:No Photos] Wound Location: [1:Right, Anterior Lower Leg Right Lower Leg - Lateral, Right Lower Leg - Medial] [2:Superior] Wounding Event: [1:Gradually Appeared] [2:Gradually Appeared] [  3:Gradually Appeared] Primary Etiology: [1:To be determined] [2:To be determined] [3:To be determined] Comorbid History: [1:N/A] [2:Lymphedema, Congestive Lymphedema, Congestive Heart Failure, Hypertension, Type II Diabetes, Gout] [3:Heart Failure, Hypertension, Type II Diabetes, Gout] Date Acquired: [1:12/13/2014] [2:12/13/2014] [3:12/13/2014] Weeks of Treatment: [1:1] [2:1] [3:1] Wound Status: [1:Open] [2:Open] [3:Open] Measurements L x W x D 0x0x0 [2:1x1.4x0.2] [3:2x1.5x0.2] (cm) Area (cm) : [1:0] [2:1.1] [3:2.356] Volume (cm) : [1:0] [2:0.22] [3:0.471] % Reduction in Area: [1:100.00%] [2:-40.10%] [3:-25.00%] % Reduction in Volume: 100.00% [2:-40.10%] [3:-24.90%] Classification: [1:Partial Thickness] [2:Full Thickness Without Exposed Support Structures] [3:Full Thickness Without Exposed Support Structures] HBO Classification: [1:N/A] [2:Grade 1] [3:Grade 1] Exudate Amount: [1:N/A] [2:Medium] [3:Medium] Exudate Type: [1:N/A] [2:Serosanguineous] [3:Serosanguineous] Exudate Color: [1:N/A] [2:red, brown] [3:red, brown] Wound Margin: [1:N/A] [2:Flat and Intact] [3:Flat and Intact] Granulation Amount: [1:N/A] [2:Small (1-33%)] [3:Small (1-33%)] Granulation Quality: [1:N/A] [2:N/A] [3:Red, Pink] Necrotic Amount:  [1:N/A] [2:Medium (34-66%)] [3:Medium (34-66%)] Epithelialization: [1:N/A] [2:None] [3:None] Periwound Skin Texture: No Abnormalities Noted Edema: Yes Edema: Yes Periwound Skin No Abnormalities Noted Moist: Yes Moist: Yes Moisture: Periwound Skin Color: No Abnormalities Noted No Abnormalities Noted No Abnormalities Noted Temperature: N/A No Abnormality N/A Tenderness on No Yes Yes Palpation: Wound Preparation: N/A Ulcer Cleansing: Other: Ulcer Cleansing: Other: soap and water soap and water Topical Anesthetic Topical Anesthetic Applied: Other: lidocaine Applied: Other: lidocaine 4% 4% Wound Number: _0 Photos: No Photos No Photos No Photos Wound Location: Right Lower Leg - Distal Right, Dorsal Lower Leg Right Lower Leg - Dorsal, Inferior Wounding Event: Gradually Appeared Gradually Appeared Blister Primary Etiology: To be determined Pressure Ulcer To be determined Comorbid History: Lymphedema, Congestive N/A Lymphedema, Congestive Heart Failure, Heart Failure, Hypertension, Type II Hypertension, Type II Diabetes, Gout Diabetes, Gout Date Acquired: 12/13/2014 12/13/2014 12/13/2014 Weeks of Treatment: _1 Wound Status: Open Open Open Measurements L x W x D 1.5x1.5x0.2 0x0x0 0.2x0.3x0.1 (cm) Area (cm) : 1.767 0 0.047 Volume (cm) : 0.353 0 0.005 % Reduction in Area: -15.30% 100.00% 33.80% % Reduction in Volume: -15.40% 100.00% 28.60% Classification: Full Thickness Without Category/Stage II Partial Thickness Exposed Support Structures HBO Classification: Grade 1 N/A Grade 1 Exudate Amount: Medium N/A Large Exudate Type: Serosanguineous N/A Serous Exudate Color: red, brown N/A amber Wound Margin: Flat and Intact N/A Flat and Intact Granulation Amount: Small (1-33%) N/A Medium (34-66%) Granulation Quality: Red, Pink N/A Pink Necrotic Amount: Medium (34-66%) N/A Medium (34-66%) Exposed Structures: Fascia: No N/A Fascia: No Fat: No Fat: No Tendon: No Tendon:  No Muscle: No Muscle: No Joint: No Joint: No JAQUIN, COY ([id number]) Bone: No Bone: No Limited to Skin Limited to Skin Breakdown Breakdown Epithelialization: None N/A None Periwound Skin Texture: Edema: Yes No Abnormalities Noted Edema: Yes Periwound Skin Moist: Yes No Abnormalities Noted Maceration: Yes Moisture: Moist: Yes Periwound Skin Color: No Abnormalities Noted No Abnormalities Noted No Abnormalities Noted Temperature: No Abnormality N/A No Abnormality Tenderness on Yes No Yes Palpation: Wound Preparation: Ulcer Cleansing: Other: N/A Ulcer Cleansing: Other: soap and water soap and water Topical Anesthetic Topical Anesthetic Applied: Other: lidocaine Applied: Other: lidocaine 4% 4% Treatment Notes Electronic Signature(s) Signed: 01/08/2015 4:09:06 PM By: Montey Hora Entered By: Montey Hora on 01/08/2015 08:45:39 [name] ([id number]) -------------------------------------------------------------------------------- [place] Details Patient Name: [name] Date of Service: 01/08/2015 8:00 AM Medical Record Number: [id number] Patient Account Number: [id number] Date of Birth/Sex: 1965-08-17 (49 y.o. Male) Treating RN: Montey Hora Primary Care Physician: [name], [place] Other Clinician: Referring Physician: [name], [place] Treating Physician/Extender: Christin Fudge  Weeks in Treatment: 1 Active Inactive Abuse / Safety / Falls / Self Care Management Nursing Diagnoses: Potential for falls Goals: Patient will remain injury free Date Initiated: 01/01/2015 Goal Status: Active Patient/caregiver will verbalize/demonstrate measures taken to prevent injury and/or falls Date Initiated: 01/01/2015 Goal Status: Active Interventions: Assess fall risk on admission and as needed Notes: Nutrition Nursing Diagnoses: Imbalanced nutrition Goals: Patient/caregiver agrees to and verbalizes understanding of need to use nutritional  supplements and/or vitamins as prescribed Date Initiated: 01/01/2015 Goal Status: Active Patient/caregiver verbalizes understanding of need to maintain therapeutic glucose control per primary care physician Date Initiated: 01/01/2015 Goal Status: Active Patient/caregiver will maintain therapeutic glucose control Date Initiated: 01/01/2015 Goal Status: Active Interventions: RADEK, CARNERO ([id number]) Provide education on elevated blood sugars and impact on wound healing Provide education on nutrition Notes: Orientation to the Wound Care Program Nursing Diagnoses: Knowledge deficit related to the wound healing center program Goals: Patient/caregiver will verbalize understanding of the [place] Program Date Initiated: 01/01/2015 Goal Status: Active Interventions: Provide education on orientation to the wound center Notes: Pain, Acute or Chronic Nursing Diagnoses: Pain, acute or chronic: actual or potential Goals: Patient will verbalize adequate pain control and receive pain control interventions during procedures as needed Date Initiated: 01/01/2015 Goal Status: Active Patient/caregiver will verbalize adequate pain control between visits Date Initiated: 01/01/2015 Goal Status: Active Patient/caregiver will verbalize comfort level met Date Initiated: 01/01/2015 Goal Status: Active Interventions: Assess comfort goal upon admission Complete pain assessment as per visit requirements Notes: Wound/Skin Impairment Nursing Diagnoses: Impaired tissue integrity KENDALE, REMBOLD ([id number]) Knowledge deficit related to smoking impact on wound healing Knowledge deficit related to ulceration/compromised skin integrity Goals: Patient will demonstrate a reduced rate of smoking or cessation of smoking Date Initiated: 01/01/2015 Goal Status: Active Patient/caregiver will verbalize understanding of skin care regimen Date Initiated: 01/01/2015 Goal Status:  Active Ulcer/skin breakdown will have a volume reduction of 30% by week 4 Date Initiated: 01/01/2015 Goal Status: Active Ulcer/skin breakdown will have a volume reduction of 50% by week 8 Date Initiated: 01/01/2015 Goal Status: Active Ulcer/skin breakdown will have a volume reduction of 80% by week 12 Date Initiated: 01/01/2015 Goal Status: Active Interventions: Assess patient/caregiver ability to obtain necessary supplies Assess patient/caregiver ability to perform ulcer/skin care regimen upon admission and as needed Notes: Electronic Signature(s) Signed: 01/08/2015 4:09:06 PM By: Montey Hora Entered By: Montey Hora on 01/08/2015 08:45:32 [name] ([id number]) -------------------------------------------------------------------------------- Patient/Caregiver Education Details Patient Name: [name] Date of Service: 01/08/2015 8:00 AM Medical Record Number: [id number] Patient Account Number: [id number] Date of Birth/Gender: Sep 27, 1965 (49 y.o. Male) Treating RN: Montey Hora Primary Care Physician: [name], MELINDA Other Clinician: Referring Physician: [name], [place] Treating Physician/Extender: Frann Rider in Treatment: 1 Education Assessment Education Provided To: Patient Education Topics Provided Nutrition: Handouts: Other: obesity and wound healing Methods: Explain/Verbal Responses: State content correctly Smoking and Wound Healing: Handouts: Smoking and Wound Healing Methods: Explain/Verbal Responses: State content correctly Electronic Signature(s) Signed: 01/08/2015 4:09:06 PM By: Montey Hora Entered By: Montey Hora on 01/08/2015 08:47:41 [name] ([id number]) -------------------------------------------------------------------------------- Wound Assessment Details Patient Name: [name] Date of Service: 01/08/2015 8:00 AM Medical Record Number: [id number] Patient Account Number: [id number] Date of Birth/Sex:  [birth year] (49 y.o. Male) Treating RN: Montey Hora Primary Care Physician: [name], [place] Other Clinician: Referring Physician: [name], MELINDA Treating Physician/Extender: Frann Rider in Treatment: 1 Wound Status Wound Number: 1 Primary Etiology: To be determined Wound Location: Right, Anterior Lower Leg Wound Status: Open Wounding  Event: Gradually Appeared Date Acquired: 12/13/2014 Weeks Of Treatment: 1 Clustered Wound: No Photos Photo Uploaded By: Montey Hora on 01/08/2015 12:05:53 Wound Measurements Length: (cm) 0 % Reduction Width: (cm) 0 % Reduction Depth: (cm) 0 Area: (cm) 0 Volume: (cm) 0 in Area: 100% in Volume: 100% Wound Description Classification: Partial Thickness Periwound Skin Texture Texture Color No Abnormalities Noted: No No Abnormalities Noted: No Moisture No Abnormalities Noted: No Electronic Signature(s) Signed: 01/08/2015 4:09:06 PM By: Lamarr Lulas ([id number]) Entered By: Montey Hora on 01/08/2015 08:30:47 [name] ([id number]) -------------------------------------------------------------------------------- Wound Assessment Details Patient Name: [name] Date of Service: 01/08/2015 8:00 AM Medical Record Number: [id number] Patient Account Number: [id number] Date of Birth/Sex: 04-Dec-1965 (49 y.o. Male) Treating RN: Montey Hora Primary Care Physician: [name], [place] Other Clinician: Referring Physician: [name], MELINDA Treating Physician/Extender: Frann Rider in Treatment: 1 Wound Status Wound Number: 2 Primary To be determined Etiology: Wound Location: Right Lower Leg - Lateral, Superior Wound Open Status: Wounding Event: Gradually Appeared Comorbid Lymphedema, Congestive Heart Date Acquired: 12/13/2014 History: Failure, Hypertension, Type II Weeks Of Treatment: 1 Diabetes, Gout Clustered Wound: No Photos Photo Uploaded By: Montey Hora on 01/08/2015  12:05:54 Wound Measurements Length: (cm) 1 Width: (cm) 1.4 Depth: (cm) 0.2 Area: (cm) 1.1 Volume: (cm) 0.22 % Reduction in Area: -40.1% % Reduction in Volume: -40.1% Epithelialization: None Tunneling: No Undermining: No Wound Description Full Thickness Without Exposed Foul Odor A Classification: Support Structures Diabetic Severity Grade 1 (Wagner): Wound Margin: Flat and Intact Exudate Amount: Medium Exudate Type: Serosanguineous Exudate Color: red, brown fter Cleansing: No Wound Bed Granulation Amount: Small (1-33%) Exposed Structure DAVIE, CLAUD. ([id number]) Necrotic Amount: Medium (34-66%) Fascia Exposed: No Necrotic Quality: Adherent Slough Fat Layer Exposed: No Tendon Exposed: No Muscle Exposed: No Joint Exposed: No Bone Exposed: No Limited to Skin Breakdown Periwound Skin Texture Texture Color No Abnormalities Noted: No No Abnormalities Noted: No Localized Edema: Yes Temperature / Pain Moisture Temperature: No Abnormality No Abnormalities Noted: No Tenderness on Palpation: Yes Moist: Yes Wound Preparation Ulcer Cleansing: Other: soap and water, Topical Anesthetic Applied: Other: lidocaine 4%, Treatment Notes Wound #2 (Right, Lateral, Superior Lower Leg) 1. Cleansed with: Cleanse wound with antibacterial soap and water 2. Anesthetic Topical Lidocaine 4% cream to wound bed prior to debridement 3. Peri-wound Care: Skin Prep 4. Dressing Applied: Aquacel Ag 5. Secondary Dressing Applied Bordered Foam Dressing 7. Secured with Patient to wear own compression stockings Electronic Signature(s) Signed: 01/08/2015 4:09:06 PM By: Montey Hora Entered By: Montey Hora on 01/08/2015 08:39:21 [name] ([id number]) -------------------------------------------------------------------------------- Wound Assessment Details Patient Name: [name] Date of Service: 01/08/2015 8:00 AM Medical Record Number: [id number] Patient  Account Number: [id number] Date of Birth/Sex: Mar 24, 1965 (49 y.o. Male) Treating RN: Montey Hora Primary Care Physician: [name], [place] Other Clinician: Referring Physician: [name], [place] Treating Physician/Extender: Frann Rider in Treatment: 1 Wound Status Wound Number: 3 Primary To be determined Etiology: Wound Location: Right Lower Leg - Medial Wound Open Wounding Event: Gradually Appeared Status: Date Acquired: 12/13/2014 Comorbid Lymphedema, Congestive Heart Weeks Of Treatment: 1 History: Failure, Hypertension, Type II Clustered Wound: No Diabetes, Gout Photos Photo Uploaded By: Montey Hora on 01/08/2015 12:06:36 Wound Measurements Length: (cm) 2 Width: (cm) 1.5 Depth: (cm) 0.2 Area: (cm) 2.356 Volume: (cm) 0.471 % Reduction in Area: -25% % Reduction in Volume: -24.9% Epithelialization: None Tunneling: No Undermining: No Wound Description Full Thickness Without Exposed Foul Odor A Classification: Support Structures Diabetic Severity Grade 1 (Wagner): Wound Margin:  Flat and Intact Exudate Amount: Medium Exudate Type: Serosanguineous Exudate Color: red, brown fter Cleansing: No Wound Bed Granulation Amount: Small (1-33%) JEFFERSON, FULLAM. ([id number]) Granulation Quality: Red, Pink Necrotic Amount: Medium (34-66%) Necrotic Quality: Adherent Slough Periwound Skin Texture Texture Color No Abnormalities Noted: No No Abnormalities Noted: No Localized Edema: Yes Temperature / Pain Moisture Tenderness on Palpation: Yes No Abnormalities Noted: No Moist: Yes Wound Preparation Ulcer Cleansing: Other: soap and water, Topical Anesthetic Applied: Other: lidocaine 4%, Treatment Notes Wound #3 (Right, Medial Lower Leg) 1. Cleansed with: Cleanse wound with antibacterial soap and water 2. Anesthetic Topical Lidocaine 4% cream to wound bed prior to debridement 3. Peri-wound Care: Skin Prep 4. Dressing Applied: Aquacel Ag 5. Secondary  Dressing Applied Bordered Foam Dressing 7. Secured with Patient to wear own compression stockings Electronic Signature(s) Signed: 01/08/2015 4:09:06 PM By: Montey Hora Entered By: Montey Hora on 01/08/2015 08:43:34 [name] ([id number]) -------------------------------------------------------------------------------- Wound Assessment Details Patient Name: [name] Date of Service: 01/08/2015 8:00 AM Medical Record Number: [id number] Patient Account Number: [id number] Date of Birth/Sex: [birth year] (49 y.o. Male) Treating RN: Montey Hora Primary Care Physician: [name], [place] Other Clinician: Referring Physician: [name], MELINDA Treating Physician/Extender: Frann Rider in Treatment: 1 Wound Status Wound Number: 4 Primary To be determined Etiology: Wound Location: Right Lower Leg - Distal Wound Open Wounding Event: Gradually Appeared Status: Date Acquired: 12/13/2014 Comorbid Lymphedema, Congestive Heart Weeks Of Treatment: 1 History: Failure, Hypertension, Type II Clustered Wound: No Diabetes, Gout Photos Photo Uploaded By: Montey Hora on 01/08/2015 12:06:36 Wound Measurements Length: (cm) 1.5 Width: (cm) 1.5 Depth: (cm) 0.2 Area: (cm) 1.767 Volume: (cm) 0.353 % Reduction in Area: -15.3% % Reduction in Volume: -15.4% Epithelialization: None Tunneling: No Undermining: No Wound Description Full Thickness Without Exposed Foul Odor A Classification: Support Structures Diabetic Severity Grade 1 (Wagner): Wound Margin: Flat and Intact Exudate Amount: Medium Exudate Type: Serosanguineous Exudate Color: red, brown fter Cleansing: No Wound Bed Granulation Amount: Small (1-33%) Exposed Structure SWADE, SHONKA. ([id number]) Granulation Quality: Red, Pink Fascia Exposed: No Necrotic Amount: Medium (34-66%) Fat Layer Exposed: No Necrotic Quality: Adherent Slough Tendon Exposed: No Muscle Exposed: No Joint Exposed: No Bone  Exposed: No Limited to Skin Breakdown Periwound Skin Texture Texture Color No Abnormalities Noted: No No Abnormalities Noted: No Localized Edema: Yes Temperature / Pain Moisture Temperature: No Abnormality No Abnormalities Noted: No Tenderness on Palpation: Yes Moist: Yes Wound Preparation Ulcer Cleansing: Other: soap and water, Topical Anesthetic Applied: Other: lidocaine 4%, Treatment Notes Wound #4 (Right, Distal Lower Leg) 1. Cleansed with: Cleanse wound with antibacterial soap and water 2. Anesthetic Topical Lidocaine 4% cream to wound bed prior to debridement 3. Peri-wound Care: Skin Prep 4. Dressing Applied: Aquacel Ag 5. Secondary Dressing Applied Bordered Foam Dressing 7. Secured with Patient to wear own compression stockings Electronic Signature(s) Signed: 01/08/2015 4:09:06 PM By: Montey Hora Entered By: Montey Hora on 01/08/2015 08:44:50 [name] ([id number]) -------------------------------------------------------------------------------- Wound Assessment Details Patient Name: [name] Date of Service: 01/08/2015 8:00 AM Medical Record Number: [id number] Patient Account Number: [id number] Date of Birth/Sex: 31-Dec-1965 (49 y.o. Male) Treating RN: Montey Hora Primary Care Physician: [name], [place] Other Clinician: Referring Physician: [name], [place] Treating Physician/Extender: Frann Rider in Treatment: 1 Wound Status Wound Number: 5 Primary Etiology: Pressure Ulcer Wound Location: Right, Dorsal Lower Leg Wound Status: Open Wounding Event: Gradually Appeared Date Acquired: 12/13/2014 Weeks Of Treatment: 1 Clustered Wound: No Photos Photo Uploaded By: Montey Hora on 01/08/2015 12:07:08 Wound  Measurements Length: (cm) 0 % Reduction i Width: (cm) 0 % Reduction i Depth: (cm) 0 Area: (cm) 0 Volume: (cm) 0 n Area: 100% n Volume: 100% Wound Description Classification: Category/Stage II Periwound Skin  Texture Texture Color No Abnormalities Noted: No No Abnormalities Noted: No Moisture No Abnormalities Noted: No Electronic Signature(s) Signed: 01/08/2015 4:09:06 PM By: Lamarr Lulas ([id number]) Entered By: Montey Hora on 01/08/2015 08:35:21 [name] ([id number]) -------------------------------------------------------------------------------- Wound Assessment Details Patient Name: [name] Date of Service: 01/08/2015 8:00 AM Medical Record Number: [id number] Patient Account Number: [id number] Date of Birth/Sex: September 08, 1965 (49 y.o. Male) Treating RN: Montey Hora Primary Care Physician: [name], [place] Other Clinician: Referring Physician: [name], [place] Treating Physician/Extender: Frann Rider in Treatment: 1 Wound Status Wound Number: 6 Primary To be determined Etiology: Wound Location: Right Lower Leg - Dorsal, Inferior Wound Open Status: Wounding Event: Blister Comorbid Lymphedema, Congestive Heart Date Acquired: 12/13/2014 History: Failure, Hypertension, Type II Weeks Of Treatment: 1 Diabetes, Gout Clustered Wound: No Photos Photo Uploaded By: Montey Hora on 01/08/2015 12:07:09 Wound Measurements Length: (cm) 0.2 Width: (cm) 0.3 Depth: (cm) 0.1 Area: (cm) 0.047 Volume: (cm) 0.005 % Reduction in Area: 33.8% % Reduction in Volume: 28.6% Epithelialization: None Tunneling: No Undermining: No Wound Description Classification: Partial Thickness Foul Odor Af Diabetic Severity Earleen [place]): Grade 1 Wound Margin: Flat and Intact Exudate Amount: Large Exudate Type: Serous Exudate Color: amber ter Cleansing: No Wound Bed Granulation Amount: Medium (34-66%) Exposed Structure Granulation Quality: Pink Fascia Exposed: No LARRY, ALCOCK ([id number]) Necrotic Amount: Medium (34-66%) Fat Layer Exposed: No Necrotic Quality: Adherent Slough Tendon Exposed: No Muscle Exposed: No Joint Exposed: No Bone Exposed:  No Limited to Skin Breakdown Periwound Skin Texture Texture Color No Abnormalities Noted: No No Abnormalities Noted: No Localized Edema: Yes Temperature / Pain Moisture Temperature: No Abnormality No Abnormalities Noted: No Tenderness on Palpation: Yes Maceration: Yes Moist: Yes Wound Preparation Ulcer Cleansing: Other: soap and water, Topical Anesthetic Applied: Other: lidocaine 4%, Treatment Notes Wound #6 (Right, Dorsal, Inferior Lower Leg) 1. Cleansed with: Cleanse wound with antibacterial soap and water 2. Anesthetic Topical Lidocaine 4% cream to wound bed prior to debridement 3. Peri-wound Care: Skin Prep 4. Dressing Applied: Aquacel Ag 5. Secondary Dressing Applied Bordered Foam Dressing 7. Secured with Patient to wear own compression stockings Electronic Signature(s) Signed: 01/08/2015 4:09:06 PM By: Montey Hora Entered By: Montey Hora on 01/08/2015 08:45:24 [name] ([id number]) -------------------------------------------------------------------------------- [place] Details Patient Name: [name] Date of Service: 01/08/2015 8:00 AM Medical Record Number: [id number] Patient Account Number: [id number] Date of Birth/Sex: [birth year] (49 y.o. Male) Treating RN: Montey Hora Primary Care Physician: [name], [place] Other Clinician: Referring Physician: [name], [place] Treating Physician/Extender: Frann Rider in Treatment: 1 Vital Signs Time Taken: 08:13 Temperature (F): 98.3 Height (in): 71 Pulse (bpm): 89 Weight (lbs): 360 Respiratory Rate (breaths/min): 20 Body Mass Index (BMI): 50.2 Blood Pressure (mmHg): 144/100 Reference Range: 80 - 120 mg / dl Electronic Signature(s) Signed: 01/08/2015 4:09:06 PM By: Montey Hora Entered By: Montey Hora on 01/08/2015 08:17:18

## 2015-01-11 DIAGNOSIS — Z720 Tobacco use: Secondary | ICD-10-CM | POA: Insufficient documentation

## 2015-01-15 ENCOUNTER — Encounter: Payer: Managed Care, Other (non HMO) | Admitting: Surgery

## 2015-01-15 DIAGNOSIS — E11622 Type 2 diabetes mellitus with other skin ulcer: Secondary | ICD-10-CM | POA: Diagnosis not present

## 2015-01-15 NOTE — Telephone Encounter (Signed)
Spoke with patient and discussed office no show policy. Patient states that he has missed appointments because he is unable to pay his co-pay. In addition, I advised Mr. [name] of several financial hardship options, agreed to set him up on a payment plan, and will provide him with a hardship application when he comes in for his visit on 01/17/15.

## 2015-01-16 NOTE — Progress Notes (Signed)
[name] ([id number]) Visit Report for 01/15/2015 Chief Complaint Document Details Patient Name: [name] Date of Service: 01/15/2015 8:45 AM Medical Record Number: [id number] Patient Account Number: [id number] Date of Birth/Sex: [birth year] (49 y.o. Male) Treating RN: Baruch Gouty, RN, BSN, Velva Harman Primary Care Physician: Enid Derry Other Clinician: Referring Physician: [name], MELINDA Treating Physician/Extender: Frann Rider in Treatment: 2 Information Obtained from: Patient Chief Complaint Patients presents for treatment of an open diabetic ulcer over the right lower extremity with swelling of both legs for about 1 month. Electronic Signature(s) Signed: 01/15/2015 10:07:56 AM By: Christin Fudge MD, FACS Entered By: Christin Fudge on 01/15/2015 10:07:56 [name] ([id number]) -------------------------------------------------------------------------------- HPI Details Patient Name: [name] Date of Service: 01/15/2015 8:45 AM Medical Record Number: [id number] Patient Account Number: [id number] Date of Birth/Sex: 01/06/1966 (49 y.o. Male) Treating RN: Baruch Gouty, RN, BSN, Velva Harman Primary Care Physician: [name], Rip Harbour Other Clinician: Referring Physician: [name], MELINDA Treating Physician/Extender: Frann Rider in Treatment: 2 History of Present Illness Location: Started getting pustules and boils on his right lower extremity along with swelling and was treated for this Quality: Patient reports experiencing a dull pain to affected area(s). Severity: Patient states wound are getting worse. Duration: Patient has had the wound for < 4 weeks prior to presenting for treatment Timing: Pain in wound is Intermittent (comes and goes Context: The wound appeared gradually over time Modifying Factors: Other treatment(s) tried include: local care and antibiotics and some compression stockings. Associated Signs and Symptoms: Patient reports having difficulty  standing for long periods. HPI Description: 49 year old gentleman who has type 2 diabetes mellitus and morbid obesity has been having cellulitis and abscess of the right lower leg for the last 3 weeks. He has had multiple antibiotics and an IandD and over the last 10 days has been on Septra DS and doxycycline and prior to that Keflex. the patient is a poor historian and does not give a definite timeline but says he's been diagnosed with diabetes mellitus for about 8-10 months and has been on insulin. He does not measure his blood sugars regularly nor does he see his PCP regularly. Also has congestive heart failure for which he sees a cardiologist at [facility] but this is going to change as the cardiologist as retired. He is a smoker and smokes several cigars a day and drinks alcohol occasionally. He has been morbidly obese for several years. 01/08/2015 -- the patient complains about having to use his 3 layer compression wrap and has multiple issues with this none of them being medically oriented. He also continues to smoke, has not yet seen his cardiologist or his PCP and in general has continued to not look after himself. 01/15/2015 -- the patient did not allow [place] to put his 3 layer compression wrap last week and has been using his own compression stockings. Continues to smoke. He did see his cardiologist and they have increased his dose of Lasix, asked him to repeat an echo, lose weight and be compliant with his diabetic control. Electronic Signature(s) Signed: 01/15/2015 10:08:46 AM By: Christin Fudge MD, FACS Entered By: Christin Fudge on 01/15/2015 10:08:46 [name] ([id number]) -------------------------------------------------------------------------------- Physical Exam Details Patient Name: [name] Date of Service: 01/15/2015 8:45 AM Medical Record Number: [id number] Patient Account Number: [id number] Date of Birth/Sex: 1965-10-26 (49 y.o. Male) Treating RN:  Baruch Gouty, RN, BSN, Velva Harman Primary Care Physician: [name], Rip Harbour Other Clinician: Referring Physician: [name], MELINDA Treating Physician/Extender: Frann Rider in Treatment: 2 Constitutional . Pulse  regular. Respirations normal and unlabored. Afebrile. . Eyes Nonicteric. Reactive to light. Ears, Nose, Mouth, and Throat Lips, teeth, and gums WNL.[name] Kitchen Moist mucosa without lesions. Neck supple and nontender. No palpable supraclavicular or cervical adenopathy. Normal sized without goiter. Respiratory WNL. No retractions.. Cardiovascular Pedal Pulses WNL. No clubbing, cyanosis or edema. Chest Breasts symmetical and no nipple discharge.. Breast tissue WNL, no masses, lumps, or tenderness.. Lymphatic No adneopathy. No adenopathy. No adenopathy. Musculoskeletal Adexa without tenderness or enlargement.. Digits and nails w/o clubbing, cyanosis, infection, petechiae, ischemia, or inflammatory conditions.. Integumentary (Hair, Skin) No suspicious lesions. No crepitus or fluctuance. No peri-wound warmth or erythema. No masses.[name] Kitchen Psychiatric Judgement and insight Intact.. No evidence of depression, anxiety, or agitation.. Notes the edema has gone down since last week but the wound themselves are not appropriately dressed and continued to have tunneling and depth and a lot of fluid draining from them. Sharp subcutaneous debridement is not possible as the patient is very tender and will not give consent for this. Electronic Signature(s) Signed: 01/15/2015 10:09:35 AM By: Christin Fudge MD, FACS Entered By: Christin Fudge on 01/15/2015 10:09:34 [name] ([id number]) -------------------------------------------------------------------------------- Physician Orders Details Patient Name: [name] Date of Service: 01/15/2015 8:45 AM Medical Record Number: [id number] Patient Account Number: [id number] Date of Birth/Sex: 08-Jul-1965 (49 y.o. Male) Treating RN: Baruch Gouty, RN, BSN,  Velva Harman Primary Care Physician: [name], Rip Harbour Other Clinician: Referring Physician: [name], MELINDA Treating Physician/Extender: Frann Rider in Treatment: 2 Verbal / Phone Orders: Yes Clinician: Afful, RN, BSN, Rita Read Back and Verified: Yes Diagnosis Coding Wound Cleansing Wound #2 Right,Lateral,Superior Lower Leg o Cleanse wound with mild soap and water o May Shower, gently pat wound dry prior to applying new dressing. Wound #3 Right,Medial Lower Leg o Cleanse wound with mild soap and water o May Shower, gently pat wound dry prior to applying new dressing. Wound #4 Right,Distal Lower Leg o Cleanse wound with mild soap and water o May Shower, gently pat wound dry prior to applying new dressing. Anesthetic Wound #2 Right,Lateral,Superior Lower Leg o Topical Lidocaine 4% cream applied to wound bed prior to debridement Wound #3 Right,Medial Lower Leg o Topical Lidocaine 4% cream applied to wound bed prior to debridement Wound #4 Right,Distal Lower Leg o Topical Lidocaine 4% cream applied to wound bed prior to debridement Primary Wound Dressing Wound #2 Right,Lateral,Superior Lower Leg o Aquacel Ag Wound #3 Right,Medial Lower Leg o Aquacel Ag Wound #4 Right,Distal Lower Leg o Aquacel Ag Secondary Dressing Wound #2 Right,Lateral,Superior Lower Leg o Boardered Foam Dressing [name], [name] ([id number]) Wound #3 Right,Medial Lower Leg o Boardered Foam Dressing Wound #4 Right,Distal Lower Leg o Boardered Foam Dressing Dressing Change Frequency Wound #2 Right,Lateral,Superior Lower Leg o Change dressing every other day. Wound #3 Right,Medial Lower Leg o Change dressing every other day. Wound #4 Right,Distal Lower Leg o Change dressing every other day. Follow-up Appointments Wound #2 Right,Lateral,Superior Lower Leg o Return Appointment in 1 week. Wound #3 Right,Medial Lower Leg o Return Appointment in 1 week. Wound #4  Right,Distal Lower Leg o Return Appointment in 1 week. Edema Control Wound #2 Right,Lateral,Superior Lower Leg o Patient to wear own compression stockings o Elevate legs to the level of the heart and pump ankles as often as possible Wound #3 Right,Medial Lower Leg o Patient to wear own compression stockings o Elevate legs to the level of the heart and pump ankles as often as possible Wound #4 Right,Distal Lower Leg o Patient to wear own compression stockings o  Elevate legs to the level of the heart and pump ankles as often as possible Electronic Signature(s) Signed: 01/15/2015 4:08:32 PM By: Christin Fudge MD, FACS Signed: 01/15/2015 4:12:03 PM By: Regan Lemming BSN, RN Entered By: Regan Lemming on 01/15/2015 09:11:17 [name] ([id number]) -------------------------------------------------------------------------------- Problem List Details Patient Name: [name] Date of Service: 01/15/2015 8:45 AM Medical Record Number: [id number] Patient Account Number: [id number] Date of Birth/Sex: 10-24-1965 (49 y.o. Male) Treating RN: Baruch Gouty, RN, BSN, Velva Harman Primary Care Physician: [name], Rip Harbour Other Clinician: Referring Physician: [name], [place] Treating Physician/Extender: Frann Rider in Treatment: 2 Active Problems ICD-10 Encounter Code Description Active Date Diagnosis E11.622 Type 2 diabetes mellitus with other skin ulcer 01/01/2015 Yes E66.01 Morbid (severe) obesity due to excess calories 01/01/2015 Yes L02.415 Cutaneous abscess of right lower limb 01/01/2015 Yes L97.222 Non-pressure chronic ulcer of left calf with fat layer 01/01/2015 Yes exposed I89.0 Lymphedema, not elsewhere classified 01/01/2015 Yes I50.40 Unspecified combined systolic (congestive) and diastolic 99991111 Yes (congestive) heart failure F17.218 Nicotine dependence, cigarettes, with other nicotine- 01/01/2015 Yes induced disorders Inactive Problems Resolved Problems Electronic  Signature(s) Signed: 01/15/2015 10:07:46 AM By: Christin Fudge MD, FACS Entered By: Christin Fudge on 01/15/2015 10:07:46 [name] ([id number]) -------------------------------------------------------------------------------- Progress Note Details Patient Name: [name] Date of Service: 01/15/2015 8:45 AM Medical Record Number: [id number] Patient Account Number: [id number] Date of Birth/Sex: May 30, 1965 (49 y.o. Male) Treating RN: Baruch Gouty, RN, BSN, [place] Primary Care Physician: [name], Rip Harbour Other Clinician: Referring Physician: [name], MELINDA Treating Physician/Extender: Frann Rider in Treatment: 2 Subjective Chief Complaint Information obtained from Patient Patients presents for treatment of an open diabetic ulcer over the right lower extremity with swelling of both legs for about 1 month. History of Present Illness (HPI) The following HPI elements were documented for the patient's wound: Location: Started getting pustules and boils on his right lower extremity along with swelling and was treated for this Quality: Patient reports experiencing a dull pain to affected area(s). Severity: Patient states wound are getting worse. Duration: Patient has had the wound for < 4 weeks prior to presenting for treatment Timing: Pain in wound is Intermittent (comes and goes Context: The wound appeared gradually over time Modifying Factors: Other treatment(s) tried include: local care and antibiotics and some compression stockings. Associated Signs and Symptoms: Patient reports having difficulty standing for long periods. 49 year old gentleman who has type 2 diabetes mellitus and morbid obesity has been having cellulitis and abscess of the right lower leg for the last 3 weeks. He has had multiple antibiotics and an IandD and over the last 10 days has been on Septra DS and doxycycline and prior to that Keflex. the patient is a poor historian and does not give a definite  timeline but says he's been diagnosed with diabetes mellitus for about 8-10 months and has been on insulin. He does not measure his blood sugars regularly nor does he see his PCP regularly. Also has congestive heart failure for which he sees a cardiologist at [facility] but this is going to change as the cardiologist as retired. He is a smoker and smokes several cigars a day and drinks alcohol occasionally. He has been morbidly obese for several years. 01/08/2015 -- the patient complains about having to use his 3 layer compression wrap and has multiple issues with this none of them being medically oriented. He also continues to smoke, has not yet seen his cardiologist or his PCP and in general has continued to not look after himself. 01/15/2015 --  the patient did not allow [place] to put his 3 layer compression wrap last week and has been using his own compression stockings. Continues to smoke. He did see his cardiologist and they have increased his dose of Lasix, asked him to repeat an echo, lose weight and be compliant with his diabetic control. [name], [name] ([id number]) Objective Constitutional Pulse regular. Respirations normal and unlabored. Afebrile. Vitals Time Taken: 8:46 AM, Height: 71 in, Weight: 360 lbs, BMI: 50.2, Temperature: 98 F, Pulse: 93 bpm, Respiratory Rate: 21 breaths/min, Blood Pressure: 163/95 mmHg. Eyes Nonicteric. Reactive to light. Ears, Nose, Mouth, and Throat Lips, teeth, and gums WNL.[name] Kitchen Moist mucosa without lesions. Neck supple and nontender. No palpable supraclavicular or cervical adenopathy. Normal sized without goiter. Respiratory WNL. No retractions.. Cardiovascular Pedal Pulses WNL. No clubbing, cyanosis or edema. Chest Breasts symmetical and no nipple discharge.. Breast tissue WNL, no masses, lumps, or tenderness.. Lymphatic No adneopathy. No adenopathy. No adenopathy. Musculoskeletal Adexa without tenderness or enlargement.. Digits and nails w/o  clubbing, cyanosis, infection, petechiae, ischemia, or inflammatory conditions.[name] Kitchen Psychiatric Judgement and insight Intact.. No evidence of depression, anxiety, or agitation.. General Notes: the edema has gone down since last week but the wound themselves are not appropriately dressed and continued to have tunneling and depth and a lot of fluid draining from them. Sharp subcutaneous debridement is not possible as the patient is very tender and will not give consent for this. Integumentary (Hair, Skin) No suspicious lesions. No crepitus or fluctuance. No peri-wound warmth or erythema. No masses.. Wound #2 status is Open. Original cause of wound was Gradually Appeared. The wound is located on the Right,Lateral,Superior Lower Leg. The wound measures 1.9cm length x 1cm width x 0.5cm depth; [name], [name]. ([id number]) 1.492cm^2 area and 0.746cm^3 volume. The wound is limited to skin breakdown. There is no undermining noted, however, there is tunneling at 12:00 with a maximum distance of 2.9cm. There is a large amount of serosanguineous drainage noted. The wound margin is flat and intact. There is small (1-33%) granulation within the wound bed. There is a medium (34-66%) amount of necrotic tissue within the wound bed including Adherent Slough. The periwound skin appearance exhibited: Localized Edema, Moist. Periwound temperature was noted as No Abnormality. The periwound has tenderness on palpation. Wound #3 status is Open. Original cause of wound was Gradually Appeared. The wound is located on the Right,Medial Lower Leg. The wound measures 2cm length x 1.4cm width x 0.5cm depth; 2.199cm^2 area and 1.1cm^3 volume. The wound is limited to skin breakdown. There is no tunneling or undermining noted. There is a medium amount of serosanguineous drainage noted. The wound margin is flat and intact. There is small (1-33%) red, pink granulation within the wound bed. There is a medium (34-66%) amount  of necrotic tissue within the wound bed including Adherent Slough. The periwound skin appearance exhibited: Localized Edema, Moist. The periwound skin appearance did not exhibit: Callus, Crepitus, Excoriation, Fluctuance, Friable, Induration, Rash, Scarring, Dry/Scaly, Maceration, Atrophie Blanche, Cyanosis, Ecchymosis, Hemosiderin Staining, Mottled, Pallor, Rubor, Erythema. The periwound has tenderness on palpation. Wound #4 status is Open. Original cause of wound was Gradually Appeared. The wound is located on the Right,Distal Lower Leg. The wound measures 1.6cm length x 1.6cm width x 0.5cm depth; 2.011cm^2 area and 1.005cm^3 volume. The wound is limited to skin breakdown. There is no tunneling or undermining noted. There is a medium amount of serosanguineous drainage noted. The wound margin is flat and intact. There is small (1-33%) red, pink granulation within  the wound bed. There is a medium (34-66%) amount of necrotic tissue within the wound bed including Adherent Slough. The periwound skin appearance exhibited: Localized Edema, Moist. The periwound skin appearance did not exhibit: Callus, Crepitus, Excoriation, Fluctuance, Friable, Induration, Rash, Scarring, Dry/Scaly, Maceration, Atrophie Blanche, Cyanosis, Ecchymosis, Hemosiderin Staining, Mottled, Pallor, Rubor, Erythema. Periwound temperature was noted as No Abnormality. The periwound has tenderness on palpation. Wound #6 status is Open. Original cause of wound was Blister. The wound is located on the Right,Dorsal,Inferior Lower Leg. The wound measures 0cm length x 0cm width x 0cm depth; 0cm^2 area and 0cm^3 volume. The wound is limited to skin breakdown. There is no tunneling or undermining noted. There is a none present amount of drainage noted. The wound margin is flat and intact. There is no granulation within the wound bed. There is no necrotic tissue within the wound bed. The periwound skin appearance exhibited: Localized  Edema. The periwound skin appearance did not exhibit: Dry/Scaly, Maceration, Moist. Periwound temperature was noted as No Abnormality. Assessment Active Problems ICD-10 E11.622 - Type 2 diabetes mellitus with other skin ulcer E66.01 - Morbid (severe) obesity due to excess calories L02.415 - Cutaneous abscess of right lower limb L97.222 - Non-pressure chronic ulcer of left calf with fat layer exposed I89.0 - Lymphedema, not elsewhere classified [name], [name]. ([id number]) I50.40 - Unspecified combined systolic (congestive) and diastolic (congestive) heart failure F17.218 - Nicotine dependence, cigarettes, with other nicotine-induced disorders I have recommended: 1. Aquacel Ag over the wounds with a 3 layer compression to the right lower extremity and for him to continue wearing his compression stockings on the left lower extremity. I have spent a great deal of time explaining to him the reason for the 3 layer compression but the patient says he is got too many psychosocial issues and may not be able to tolerate this. I have put it on record that he has not been compliant with his compression and he refuses to wear what we prescribed. 2. Proper control of his diabetes by seeing his PCP as soon as possible and getting his blood sugars checked regularly which she has not been doing 3. he has been seen by his cardiologist was increase his dose of furosemide and told him to lose weight and do echo. 4. giving up smoking. 5. Venous duplex studies of both lower extremities.Scheduled for January 4 6. Regular visits to the wound care center and taking ownership of looking after his health and being compliant. 7. I have also told him to call the ER immediately if he develops signs and symptoms of a cellulitis Plan Wound Cleansing: Wound #2 Right,Lateral,Superior Lower Leg: Cleanse wound with mild soap and water May Shower, gently pat wound dry prior to applying new dressing. Wound #3  Right,Medial Lower Leg: Cleanse wound with mild soap and water May Shower, gently pat wound dry prior to applying new dressing. Wound #4 Right,Distal Lower Leg: Cleanse wound with mild soap and water May Shower, gently pat wound dry prior to applying new dressing. Anesthetic: Wound #2 Right,Lateral,Superior Lower Leg: Topical Lidocaine 4% cream applied to wound bed prior to debridement Wound #3 Right,Medial Lower Leg: Topical Lidocaine 4% cream applied to wound bed prior to debridement Wound #4 Right,Distal Lower Leg: Topical Lidocaine 4% cream applied to wound bed prior to debridement Primary Wound Dressing: Wound #2 Right,Lateral,Superior Lower Leg: Aquacel Ag Wound #3 Right,Medial Lower Leg: Aquacel Ag Wound #4 Right,Distal Lower Leg: [name], [name]. ([id number]) Aquacel Ag Secondary Dressing: Wound #2 Right,Lateral,Superior  Lower Leg: Boardered Foam Dressing Wound #3 Right,Medial Lower Leg: Boardered Foam Dressing Wound #4 Right,Distal Lower Leg: Boardered Foam Dressing Dressing Change Frequency: Wound #2 Right,Lateral,Superior Lower Leg: Change dressing every other day. Wound #3 Right,Medial Lower Leg: Change dressing every other day. Wound #4 Right,Distal Lower Leg: Change dressing every other day. Follow-up Appointments: Wound #2 Right,Lateral,Superior Lower Leg: Return Appointment in 1 week. Wound #3 Right,Medial Lower Leg: Return Appointment in 1 week. Wound #4 Right,Distal Lower Leg: Return Appointment in 1 week. Edema Control: Wound #2 Right,Lateral,Superior Lower Leg: Patient to wear own compression stockings Elevate legs to the level of the heart and pump ankles as often as possible Wound #3 Right,Medial Lower Leg: Patient to wear own compression stockings Elevate legs to the level of the heart and pump ankles as often as possible Wound #4 Right,Distal Lower Leg: Patient to wear own compression stockings Elevate legs to the level of the heart and pump  ankles as often as possible I have recommended: 1. Aquacel Ag over the wounds with a 3 layer compression to the right lower extremity and for him to continue wearing his compression stockings on the left lower extremity. I have spent a great deal of time explaining to him the reason for the 3 layer compression but the patient says he is got too many psychosocial issues and may not be able to tolerate this. I have put it on record that he has not been compliant with his compression and he refuses to wear what we prescribed. 2. Proper control of his diabetes by seeing his PCP as soon as possible and getting his blood sugars checked regularly which she has not been doing 3. he has been seen by his cardiologist was increase his dose of furosemide and told him to lose weight and do echo. 4. giving up smoking. [name], [name] ([id number]) 5. Venous duplex studies of both lower extremities.Scheduled for January 4 6. Regular visits to the wound care center and taking ownership of looking after his health and being compliant. 7. I have also told him to call the ER immediately if he develops signs and symptoms of a cellulitis Electronic Signature(s) Signed: 01/15/2015 10:23:48 AM By: Christin Fudge MD, FACS Previous Signature: 01/15/2015 10:11:47 AM Version By: Christin Fudge MD, FACS Entered By: Christin Fudge on 01/15/2015 10:23:47 [name] ([id number]) -------------------------------------------------------------------------------- SuperBill Details Patient Name: [name] Date of Service: 01/15/2015 Medical Record Number: [id number] Patient Account Number: [id number] Date of Birth/Sex: 08-19-1965 (49 y.o. Male) Treating RN: Baruch Gouty, RN, BSN, [place] Primary Care Physician: [name], Rip Harbour Other Clinician: Referring Physician: [name], MELINDA Treating Physician/Extender: Frann Rider in Treatment: 2 Diagnosis Coding ICD-10 Codes Code Description E11.622 Type 2 diabetes  mellitus with other skin ulcer E66.01 Morbid (severe) obesity due to excess calories L02.415 Cutaneous abscess of right lower limb L97.222 Non-pressure chronic ulcer of left calf with fat layer exposed I89.0 Lymphedema, not elsewhere classified I50.40 Unspecified combined systolic (congestive) and diastolic (congestive) heart failure F17.218 Nicotine dependence, cigarettes, with other nicotine-induced disorders Facility Procedures CPT4 Code: [id number] Description: 99214 - WOUND CARE VISIT-LEV 4 EST PT Modifier: Quantity: 1 Physician Procedures CPT4 Code: [id number] Description: 99213 - WC PHYS LEVEL 3 - EST PT ICD-10 Description Diagnosis E11.622 Type 2 diabetes mellitus with other skin ulcer E66.01 Morbid (severe) obesity due to excess calories L02.415 Cutaneous abscess of right lower limb 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

## 2015-01-16 NOTE — Progress Notes (Signed)
DEMETRIES, COIA ([id number]) Visit Report for 01/15/2015 Arrival Information Details Patient Name: [name], [name] Date of Service: 01/15/2015 8:45 AM Medical Record Number: [id number] Patient Account Number: [id number] Date of Birth/Sex: [birth year] (49 y.o. Male) Treating RN: Baruch Gouty, RN, BSN, Velva Harman Primary Care Physician: [name], Rip Harbour Other Clinician: Referring Physician: [name], [place] Treating Physician/Extender: Frann Rider in Treatment: 2 Visit Information History Since Last Visit Added or deleted any medications: No Patient Arrived: Ambulatory Any new allergies or adverse reactions: No Arrival Time: 08:41 Had a fall or experienced change in No Accompanied By: self activities of daily living that may affect Transfer Assistance: None risk of falls: Patient Identification Verified: Yes Signs or symptoms of abuse/neglect since last No Secondary Verification Process Yes visito Completed: Has Dressing in Place as Prescribed: Yes Patient Requires Transmission-Based No Pain Present Now: No Precautions: Patient Has Alerts: Yes Patient Alerts: DM II Electronic Signature(s) Signed: 01/15/2015 4:12:03 PM By: Regan Lemming BSN, RN Entered By: Regan Lemming on 01/15/2015 08:41:29 [name] ([id number]) -------------------------------------------------------------------------------- Clinic Level of Care Assessment Details Patient Name: [name] Date of Service: 01/15/2015 8:45 AM Medical Record Number: [id number] Patient Account Number: [id number] Date of Birth/Sex: 1965/10/22 (49 y.o. Male) Treating RN: Baruch Gouty, RN, BSN, [place] Primary Care Physician: [name], Rip Harbour Other Clinician: Referring Physician: [name], MELINDA Treating Physician/Extender: Frann Rider in Treatment: 2 Clinic Level of Care Assessment Items TOOL 4 Quantity Score _0  - Use when only an EandM is performed on FOLLOW-UP visit 0 ASSESSMENTS - Nursing Assessment / Reassessment X -  Reassessment of Co-morbidities (includes updates in patient status) 1 10 X - Reassessment of Adherence to Treatment Plan 1 5 ASSESSMENTS - Wound and Skin Assessment / Reassessment _1  - Simple Wound Assessment / Reassessment - one wound 0 X - Complex Wound Assessment / Reassessment - multiple wounds 3 5 _2  - Dermatologic / Skin Assessment (not related to wound area) 0 ASSESSMENTS - Focused Assessment X - Circumferential Edema Measurements - multi extremities 1 5 _3  - Nutritional Assessment / Counseling / Intervention 0 X - Lower Extremity Assessment (monofilament, tuning fork, pulses) 1 5 _4  - Peripheral Arterial Disease Assessment (using hand held doppler) 0 ASSESSMENTS - Ostomy and/or Continence Assessment and Care _5  - Incontinence Assessment and Management 0 _6  - Ostomy Care Assessment and Management (repouching, etc.) 0 PROCESS - Coordination of Care X - Simple Patient / Family Education for ongoing care 1 15 _7  - Complex (extensive) Patient / Family Education for ongoing care 0 _8  - Staff obtains Programmer, systems, Records, Test Results / Process Orders 0 _9  - Staff telephones HHA, Nursing Homes / Clarify orders / etc 0 _10  - Routine Transfer to another Facility (non-emergent condition) 0 [name], EHRLER. ([id number]) _11  - Routine Hospital Admission (non-emergent condition) 0 _12  - New Admissions / Biomedical engineer / Ordering NPWT, Apligraf, etc. 0 _13  - Emergency Hospital Admission (emergent condition) 0 _14  - Simple Discharge Coordination 0 _15  - Complex (extensive) Discharge Coordination 0 PROCESS - Special Needs _16  - Pediatric / Minor Patient Management 0 _17  - Isolation Patient Management 0 _18  - Hearing / Language / Visual special needs 0 _19  - Assessment of Community assistance (transportation, D/C planning, etc.) 0 _20  - Additional assistance / Altered mentation 0 _21  - Support Surface(s) Assessment (bed, cushion, seat, etc.) 0 INTERVENTIONS - Wound Cleansing / Measurement _22  -  Simple Wound Cleansing - one wound 0 X - Complex Wound Cleansing - multiple wounds 3 5 _23  - Wound Imaging (photographs - any number of  wounds) 0 X - Wound Tracing (instead of photographs) 1 5 _0  - Simple Wound Measurement - one wound 0 X - Complex Wound Measurement - multiple wounds 3 5 INTERVENTIONS - Wound Dressings X - Small Wound Dressing one or multiple wounds 3 10 _1  - Medium Wound Dressing one or multiple wounds 0 _2  - Large Wound Dressing one or multiple wounds 0 <[id number]>_9<\/GXQJJHERDEYCXKGY>_1  - Application of Medications - topical 0 <EHUDJSHFWYOVZCHY>_8<\/FOYDXAJOINOMVEHM>_0  - Application of Medications - injection 0 INTERVENTIONS - Miscellaneous _5  - External ear exam 0 LYNDAL, REGGIO. ([id number]) _6  - Specimen Collection (cultures, biopsies, blood, body fluids, etc.) 0 _7  - Specimen(s) / Culture(s) sent or taken to Lab for analysis 0 _8  - Patient Transfer (multiple staff / Harrel Lemon Lift / Similar devices) 0 _9  - Simple Staple / Suture removal (25 or less) 0 _10  - Complex Staple / Suture removal (26 or more) 0 _11  - Hypo / Hyperglycemic Management (close monitor of Blood Glucose) 0 _12  - Ankle / Brachial Index (ABI) - do not check if billed separately 0 X - Vital Signs 1 5 Has the patient been seen at the hospital within the last three years: Yes Total Score: 125 Level Of Care: New/Established - Level 4 Electronic Signature(s) Signed: 01/15/2015 4:12:03 PM By: Regan Lemming BSN, RN Entered By: Regan Lemming on 01/15/2015 09:12:37 [name] ([id number]) -------------------------------------------------------------------------------- Encounter Discharge Information Details Patient Name: [name] Date of Service: 01/15/2015 8:45 AM Medical Record Number: [id number] Patient Account Number: [id number] Date of Birth/Sex: April 20, 1965 (49 y.o. Male) Treating RN: Baruch Gouty, RN, BSN, Velva Harman Primary Care Physician: [name], Rip Harbour Other Clinician: Referring Physician: [name], MELINDA Treating Physician/Extender: Frann Rider in  Treatment: 2 Encounter Discharge Information Items Discharge Pain Level: 0 Discharge Condition: Stable Ambulatory Status: Ambulatory Discharge Destination: Home Transportation: Private Auto Accompanied By: self Schedule Follow-up Appointment: No Medication Reconciliation completed and provided to Patient/Care No Afnan Emberton: Provided on Clinical Summary of Care: 01/15/2015 Form Type Recipient Paper Patient RT Electronic Signature(s) Signed: 01/15/2015 9:26:28 AM By: Ruthine Dose Entered By: Ruthine Dose on 01/15/2015 81:27:51 [name] ([id number]) -------------------------------------------------------------------------------- Lower Extremity Assessment Details Patient Name: [name] Date of Service: 01/15/2015 8:45 AM Medical Record Number: [id number] Patient Account Number: [id number] Date of Birth/Sex: 1965/08/29 (49 y.o. Male) Treating RN: Baruch Gouty, RN, BSN, Velva Harman Primary Care Physician: [name], Rip Harbour Other Clinician: Referring Physician: [name], MELINDA Treating Physician/Extender: Frann Rider in Treatment: 2 Edema Assessment Assessed: [Left: No] [Right: No] E[Left: dema] [Right: :] Calf Left: Right: Point of Measurement: 34 cm From Medial Instep cm 52.2 cm Ankle Left: Right: Point of Measurement: 9 cm From Medial Instep cm 32.1 cm Vascular Assessment Claudication: Claudication Assessment [Right:None] Pulses: Posterior Tibial Dorsalis Pedis Palpable: [Right:Yes] Extremity colors, hair growth, and conditions: Extremity Color: [Right:Hyperpigmented] Hair Growth on Extremity: [Right:Yes] Temperature of Extremity: [Right:Warm] Capillary Refill: [Right:< 3 seconds] Toe Nail Assessment Left: Right: Thick: Yes Discolored: Yes Deformed: No Improper Length and Hygiene: No Electronic Signature(s) Signed: 01/15/2015 4:12:03 PM By: Regan Lemming BSN, RN Entered By: Regan Lemming on 01/15/2015 08:47:09 [name] ([name] ([id number]) -------------------------------------------------------------------------------- Multi Wound Chart Details Patient Name: [name] Date of Service: 01/15/2015 8:45 AM Medical Record Number: [id number] Patient Account Number: [id number] Date of Birth/Sex: [birth year] (49 y.o. Male) Treating RN: Baruch Gouty, RN, BSN, [place] Primary Care Physician: [name], Rip Harbour Other Clinician: Referring Physician: [name], MELINDA Treating Physician/Extender: Frann Rider in Treatment: 2 Vital Signs Height(in): 71 Pulse(bpm): 93 Weight(lbs): 360 Blood Pressure 163/95 (mmHg):  Body Mass Index(BMI): 50 Temperature(F): 98 Respiratory Rate 21 (breaths/min): Photos: [2:No Photos] [3:No Photos] [4:No Photos] Wound Location: [2:Right Lower Leg - Lateral, Right, Medial Lower Leg Right, Distal Lower Leg Superior] Wounding Event: [2:Gradually Appeared] [3:Gradually Appeared] [4:Gradually Appeared] Primary Etiology: [2:Venous Leg Ulcer] [3:Venous Leg Ulcer] [4:Venous Leg Ulcer] Comorbid History: [2:Lymphedema, Congestive Lymphedema, Congestive Lymphedema, Congestive Heart Failure, Hypertension, Type II Diabetes, Gout] [3:Heart Failure, Hypertension, Type II Diabetes, Gout] [4:Heart Failure, Hypertension, Type II Diabetes, Gout] Date Acquired: [2:12/13/2014] [3:12/13/2014] [4:12/13/2014] Weeks of Treatment: [2:2] [3:2] [4:2] Wound Status: [2:Open] [3:Open] [4:Open] Measurements L x W x D 1.9x1x0.5 [3:2x1.4x0.5] [4:1.6x1.6x0.5] (cm) Area (cm) : [2:1.492] [3:2.199] [4:2.011] Volume (cm) : [2:0.746] [3:1.1] [4:1.005] % Reduction in Area: [2:-90.10%] [3:-16.70%] [4:-31.30%] % Reduction in Volume: -375.20% [3:-191.80%] [4:-228.40%] Position 1 (o'clock): 12 Maximum Distance 1 2.9 (cm): Tunneling: [2:Yes] [3:No] [4:No] Classification: [2:Full Thickness Without Exposed Support Structures] [3:Full Thickness Without Full Thickness Without Exposed Support Structures] [4:Exposed Support  Structures] HBO Classification: [2:Grade 1] [3:Grade 1] [4:Grade 1] Exudate Amount: [2:Large] [3:Medium] [4:Medium] Exudate Type: [2:Serosanguineous] [3:Serosanguineous] [4:Serosanguineous] Exudate Color: [2:red, brown] [3:red, brown] [4:red, brown] Wound Margin: [2:Flat and Intact] [3:Flat and Intact] [4:Flat and Intact] Granulation Amount: Small (1-33%) Small (1-33%) Small (1-33%) Granulation Quality: N/A Red, Pink Red, Pink Necrotic Amount: Medium (34-66%) Medium (34-66%) Medium (34-66%) Exposed Structures: Fascia: No Fascia: No Fascia: No Fat: No Fat: No Fat: No Tendon: No Tendon: No Tendon: No Muscle: No Muscle: No Muscle: No Joint: No Joint: No Joint: No Bone: No Bone: No Bone: No Limited to Skin Limited to Skin Limited to Skin Breakdown Breakdown Breakdown Epithelialization: None None None Periwound Skin Texture: Edema: Yes Edema: Yes Edema: Yes Excoriation: No Excoriation: No Induration: No Induration: No Callus: No Callus: No Crepitus: No Crepitus: No Fluctuance: No Fluctuance: No Friable: No Friable: No Rash: No Rash: No Scarring: No Scarring: No Periwound Skin Moist: Yes Moist: Yes Moist: Yes Moisture: Maceration: No Maceration: No Dry/Scaly: No Dry/Scaly: No Periwound Skin Color: No Abnormalities Noted Atrophie Blanche: No Atrophie Blanche: No Cyanosis: No Cyanosis: No Ecchymosis: No Ecchymosis: No Erythema: No Erythema: No Hemosiderin Staining: No Hemosiderin Staining: No Mottled: No Mottled: No Pallor: No Pallor: No Rubor: No Rubor: No Temperature: No Abnormality N/A No Abnormality Tenderness on Yes Yes Yes Palpation: Wound Preparation: Ulcer Cleansing: Other: Ulcer Cleansing: Other: Ulcer Cleansing: Other: soap and water soap and water soap and water Topical Anesthetic Topical Anesthetic Topical Anesthetic Applied: Other: lidocaine Applied: Other: lidocaine Applied: Other: lidocaine 4% 4% 4% Wound Number: 6 N/A  N/A Photos: No Photos N/A N/A Wound Location: Right Lower Leg - Dorsal, N/A N/A Inferior Wounding Event: Blister N/A N/A Primary Etiology: Venous Leg Ulcer N/A N/A Comorbid History: Lymphedema, Congestive N/A N/A Heart Failure, [name], [name] ([id number]) Hypertension, Type II Diabetes, Gout Date Acquired: 12/13/2014 N/A N/A Weeks of Treatment: 2 N/A N/A Wound Status: Open N/A N/A Measurements L x W x D 0x0x0 N/A N/A (cm) Area (cm) : 0 N/A N/A Volume (cm) : 0 N/A N/A % Reduction in Area: 100.00% N/A N/A % Reduction in Volume: 100.00% N/A N/A Tunneling: No N/A N/A Classification: Partial Thickness N/A N/A HBO Classification: Grade 1 N/A N/A Exudate Amount: None Present N/A N/A Exudate Type: N/A N/A N/A Exudate Color: N/A N/A N/A Wound Margin: Flat and Intact N/A N/A Granulation Amount: None Present (0%) N/A N/A Granulation Quality: N/A N/A N/A Necrotic Amount: None Present (0%) N/A N/A Exposed Structures: Fascia: No N/A N/A Fat: No Tendon: No Muscle:  No Joint: No Bone: No Limited to Skin Breakdown Epithelialization: Large (67-100%) N/A N/A Periwound Skin Texture: Edema: Yes N/A N/A Periwound Skin Maceration: No N/A N/A Moisture: Moist: No Dry/Scaly: No Periwound Skin Color: No Abnormalities Noted N/A N/A Temperature: No Abnormality N/A N/A Tenderness on No N/A N/A Palpation: Wound Preparation: Ulcer Cleansing: Other: N/A N/A soap and water Topical Anesthetic Applied: None Treatment Notes Wound #2 (Right, Lateral, Superior Lower Leg) 1. Cleansed with: Cleanse wound with antibacterial soap and water 3. Peri-wound Care: MAXFIELD, GILDERSLEEVE ([id number]) Moisturizing lotion 4. Dressing Applied: Aquacel Ag 5. Secondary Dressing Applied Bordered Foam Dressing 7. Secured with Patient to wear own compression stockings Wound #3 (Right, Medial Lower Leg) 1. Cleansed with: Cleanse wound with antibacterial soap and water 3. Peri-wound Care: Moisturizing  lotion 4. Dressing Applied: Aquacel Ag 5. Secondary Dressing Applied Bordered Foam Dressing 7. Secured with Patient to wear own compression stockings Wound #4 (Right, Distal Lower Leg) 1. Cleansed with: Cleanse wound with antibacterial soap and water 3. Peri-wound Care: Moisturizing lotion 4. Dressing Applied: Aquacel Ag 5. Secondary Dressing Applied Bordered Foam Dressing 7. Secured with Patient to wear own compression stockings Electronic Signature(s) Signed: 01/15/2015 4:12:03 PM By: Regan Lemming BSN, RN Entered By: Regan Lemming on 01/15/2015 09:11:46 [name] ([id number]) -------------------------------------------------------------------------------- Many Details Patient Name: [name] Date of Service: 01/15/2015 8:45 AM Medical Record Number: [id number] Patient Account Number: [id number] Date of Birth/Sex: 02-15-1965 (49 y.o. Male) Treating RN: Baruch Gouty, RN, BSN, Velva Harman Primary Care Physician: [name], Rip Harbour Other Clinician: Referring Physician: [name], [place] Treating Physician/Extender: Frann Rider in Treatment: 2 Active Inactive Abuse / Safety / Falls / Self Care Management Nursing Diagnoses: Potential for falls Goals: Patient will remain injury free Date Initiated: 01/01/2015 Goal Status: Active Patient/caregiver will verbalize/demonstrate measures taken to prevent injury and/or falls Date Initiated: 01/01/2015 Goal Status: Active Interventions: Assess fall risk on admission and as needed Notes: Nutrition Nursing Diagnoses: Imbalanced nutrition Goals: Patient/caregiver agrees to and verbalizes understanding of need to use nutritional supplements and/or vitamins as prescribed Date Initiated: 01/01/2015 Goal Status: Active Patient/caregiver verbalizes understanding of need to maintain therapeutic glucose control per primary care physician Date Initiated: 01/01/2015 Goal Status: Active Patient/caregiver will  maintain therapeutic glucose control Date Initiated: 01/01/2015 Goal Status: Active Interventions: ROSHAWN, [name] ([id number]) Provide education on elevated blood sugars and impact on wound healing Provide education on nutrition Treatment Activities: Education provided on Nutrition : 01/08/2015 Notes: Orientation to the Wound Care Program Nursing Diagnoses: Knowledge deficit related to the wound healing center program Goals: Patient/caregiver will verbalize understanding of the [place] Date Initiated: 01/01/2015 Goal Status: Active Interventions: Provide education on orientation to the wound center Notes: Pain, Acute or Chronic Nursing Diagnoses: Pain, acute or chronic: actual or potential Goals: Patient will verbalize adequate pain control and receive pain control interventions during procedures as needed Date Initiated: 01/01/2015 Goal Status: Active Patient/caregiver will verbalize adequate pain control between visits Date Initiated: 01/01/2015 Goal Status: Active Patient/caregiver will verbalize comfort level met Date Initiated: 01/01/2015 Goal Status: Active Interventions: Assess comfort goal upon admission Complete pain assessment as per visit requirements Notes: Wound/Skin Impairment [name], [name] ([id number]) Nursing Diagnoses: Impaired tissue integrity Knowledge deficit related to smoking impact on wound healing Knowledge deficit related to ulceration/compromised skin integrity Goals: Patient will demonstrate a reduced rate of smoking or cessation of smoking Date Initiated: 01/01/2015 Goal Status: Active Patient/caregiver will verbalize understanding of skin care regimen Date Initiated: 01/01/2015 Goal Status: Active  Ulcer/skin breakdown will have a volume reduction of 30% by week 4 Date Initiated: 01/01/2015 Goal Status: Active Ulcer/skin breakdown will have a volume reduction of 50% by week 8 Date Initiated: 01/01/2015 Goal Status:  Active Ulcer/skin breakdown will have a volume reduction of 80% by week 12 Date Initiated: 01/01/2015 Goal Status: Active Interventions: Assess patient/caregiver ability to obtain necessary supplies Assess patient/caregiver ability to perform ulcer/skin care regimen upon admission and as needed Notes: Electronic Signature(s) Signed: 01/15/2015 4:12:03 PM By: Regan Lemming BSN, RN Entered By: Regan Lemming on 01/15/2015 09:11:30 [name] ([id number]) -------------------------------------------------------------------------------- Patient/Caregiver Education Details Patient Name: [name] Date of Service: 01/15/2015 8:45 AM Medical Record Number: [id number] Patient Account Number: [id number] Date of Birth/Gender: 16-Jan-1966 (49 y.o. Male) Treating RN: Baruch Gouty, RN, BSN, Velva Harman Primary Care Physician: [name], Rip Harbour Other Clinician: Referring Physician: [name], [place] Treating Physician/Extender: Frann Rider in Treatment: 2 Education Assessment Education Provided To: Patient Education Topics Provided Elevated Blood Sugar/ Impact on Healing: Methods: Explain/Verbal Responses: State content correctly Nutrition: Methods: Explain/Verbal Responses: State content correctly Welcome To [facility] [place]: Methods: Explain/Verbal Responses: State content correctly Electronic Signature(s) Signed: 01/15/2015 4:12:03 PM By: Regan Lemming BSN, RN Entered By: Regan Lemming on 01/15/2015 09:13:12 [name] ([id number]) -------------------------------------------------------------------------------- Wound Assessment Details Patient Name: [name] Date of Service: 01/15/2015 8:45 AM Medical Record Number: [id number] Patient Account Number: [id number] Date of Birth/Sex: [birth year] (49 y.o. Male) Treating RN: Baruch Gouty, RN, BSN, [place] Primary Care Physician: [name], Rip Harbour Other Clinician: Referring Physician: [name], MELINDA Treating Physician/Extender: Frann Rider in Treatment: 2 Wound Status Wound Number: 2 Primary Venous Leg Ulcer Etiology: Wound Location: Right Lower Leg - Lateral, Superior Wound Open Status: Wounding Event: Gradually Appeared Comorbid Lymphedema, Congestive Heart Date Acquired: 12/13/2014 History: Failure, Hypertension, Type II Weeks Of Treatment: 2 Diabetes, Gout Clustered Wound: No Photos Photo Uploaded By: Regan Lemming on 01/15/2015 12:10:04 Wound Measurements Length: (cm) 1.9 Width: (cm) 1 Depth: (cm) 0.5 Area: (cm) 1.492 Volume: (cm) 0.746 % Reduction in Area: -90.1% % Reduction in Volume: -375.2% Epithelialization: None Tunneling: Yes Position (o'clock): 12 Maximum Distance: (cm) 2.9 Undermining: No Wound Description Classification: [name], [name] ([id number]) Foul Odor After Cleansing: No Full Thickness Without Exposed Support Structures Diabetic Severity Grade 1 (Wagner): Wound Margin: Flat and Intact Exudate Amount: Large Exudate Type: Serosanguineous Exudate Color: red, brown Wound Bed Granulation Amount: Small (1-33%) Exposed Structure Necrotic Amount: Medium (34-66%) Fascia Exposed: No Necrotic Quality: Adherent Slough Fat Layer Exposed: No Tendon Exposed: No Muscle Exposed: No Joint Exposed: No Bone Exposed: No Limited to Skin Breakdown Periwound Skin Texture Texture Color No Abnormalities Noted: No No Abnormalities Noted: No Localized Edema: Yes Temperature / Pain Moisture Temperature: No Abnormality No Abnormalities Noted: No Tenderness on Palpation: Yes Moist: Yes Wound Preparation Ulcer Cleansing: Other: soap and water, Topical Anesthetic Applied: Other: lidocaine 4%, Treatment Notes Wound #2 (Right, Lateral, Superior Lower Leg) 1. Cleansed with: Cleanse wound with antibacterial soap and water 3. Peri-wound Care: Moisturizing lotion 4. Dressing Applied: Aquacel Ag 5. Secondary Dressing Applied Bordered Foam Dressing 7. Secured with Patient to  wear own compression stockings Electronic Signature(s) Signed: 01/15/2015 4:12:03 PM By: Regan Lemming BSN, RN Previous Signature: 01/15/2015 8:58:17 AM Version By: Regan Lemming BSN, RN [name], [name] ([id number]) Entered By: Regan Lemming on 01/15/2015 09:09:14 [name] ([id number]) -------------------------------------------------------------------------------- Wound Assessment Details Patient Name: [name] Date of Service: 01/15/2015 8:45 AM Medical Record Number: [id number] Patient Account Number: [id number] Date of Birth/Sex:  1965/07/22 (49 y.o. Male) Treating RN: Afful, RN, BSN, [place] Primary Care Physician: [name], [place] Other Clinician: Referring Physician: [name], MELINDA Treating Physician/Extender: Frann Rider in Treatment: 2 Wound Status Wound Number: 3 Primary Venous Leg Ulcer Etiology: Wound Location: Right, Medial Lower Leg Wound Open Wounding Event: Gradually Appeared Status: Date Acquired: 12/13/2014 Comorbid Lymphedema, Congestive Heart Weeks Of Treatment: 2 History: Failure, Hypertension, Type II Clustered Wound: No Diabetes, Gout Photos Photo Uploaded By: Regan Lemming on 01/15/2015 12:10:18 Wound Measurements Length: (cm) 2 Width: (cm) 1.4 Depth: (cm) 0.5 Area: (cm) 2.199 Volume: (cm) 1.1 % Reduction in Area: -16.7% % Reduction in Volume: -191.8% Epithelialization: None Tunneling: No Undermining: No Wound Description Full Thickness Without Exposed Foul Odor A Classification: Support Structures Diabetic Severity Grade 1 (Wagner): [name], [name] ([id number]) fter Cleansing: No Wound Margin: Flat and Intact Exudate Amount: Medium Exudate Type: Serosanguineous Exudate Color: red, brown Wound Bed Granulation Amount: Small (1-33%) Exposed Structure Granulation Quality: Red, Pink Fascia Exposed: No Necrotic Amount: Medium (34-66%) Fat Layer Exposed: No Necrotic Quality: Adherent Slough Tendon Exposed: No Muscle  Exposed: No Joint Exposed: No Bone Exposed: No Limited to Skin Breakdown Periwound Skin Texture Texture Color No Abnormalities Noted: No No Abnormalities Noted: No Callus: No Atrophie Blanche: No Crepitus: No Cyanosis: No Excoriation: No Ecchymosis: No Fluctuance: No Erythema: No Friable: No Hemosiderin Staining: No Induration: No Mottled: No Localized Edema: Yes Pallor: No Rash: No Rubor: No Scarring: No Temperature / Pain Moisture Tenderness on Palpation: Yes No Abnormalities Noted: No Dry / Scaly: No Maceration: No Moist: Yes Wound Preparation Ulcer Cleansing: Other: soap and water, Topical Anesthetic Applied: Other: lidocaine 4%, Treatment Notes Wound #3 (Right, Medial Lower Leg) 1. Cleansed with: Cleanse wound with antibacterial soap and water 3. Peri-wound Care: Moisturizing lotion 4. Dressing Applied: Aquacel Ag 5. Secondary Dressing Applied [name], [name] ([id number]) Bordered Foam Dressing 7. Secured with Patient to wear own compression stockings Electronic Signature(s) Signed: 01/15/2015 4:12:03 PM By: Regan Lemming BSN, RN Previous Signature: 01/15/2015 8:58:41 AM Version By: Regan Lemming BSN, RN Entered By: Regan Lemming on 01/15/2015 09:08:51 [name] ([id number]) -------------------------------------------------------------------------------- Wound Assessment Details Patient Name: [name] Date of Service: 01/15/2015 8:45 AM Medical Record Number: [id number] Patient Account Number: [id number] Date of Birth/Sex: 08-May-1965 (49 y.o. Male) Treating RN: Afful, RN, BSN, [place] Primary Care Physician: [name], Rip Harbour Other Clinician: Referring Physician: [name], MELINDA Treating Physician/Extender: Frann Rider in Treatment: 2 Wound Status Wound Number: 4 Primary Venous Leg Ulcer Etiology: Wound Location: Right, Distal Lower Leg Wound Open Wounding Event: Gradually Appeared Status: Date Acquired: 12/13/2014 Comorbid  Lymphedema, Congestive Heart Weeks Of Treatment: 2 History: Failure, Hypertension, Type II Clustered Wound: No Diabetes, Gout Photos Photo Uploaded By: Regan Lemming on 01/15/2015 12:10:42 Wound Measurements Length: (cm) 1.6 Width: (cm) 1.6 Depth: (cm) 0.5 Area: (cm) 2.011 Volume: (cm) 1.005 % Reduction in Area: -31.3% % Reduction in Volume: -228.4% Epithelialization: None Tunneling: No Undermining: No Wound Description Full Thickness Without Exposed Foul Odor A Classification: Support Structures Diabetic Severity Grade 1 (Wagner): [name], [name] ([id number]) fter Cleansing: No Wound Margin: Flat and Intact Exudate Amount: Medium Exudate Type: Serosanguineous Exudate Color: red, brown Wound Bed Granulation Amount: Small (1-33%) Exposed Structure Granulation Quality: Red, Pink Fascia Exposed: No Necrotic Amount: Medium (34-66%) Fat Layer Exposed: No Necrotic Quality: Adherent Slough Tendon Exposed: No Muscle Exposed: No Joint Exposed: No Bone Exposed: No Limited to Skin Breakdown Periwound Skin Texture Texture Color No Abnormalities Noted: No No Abnormalities Noted: No  Callus: No Atrophie Blanche: No Crepitus: No Cyanosis: No Excoriation: No Ecchymosis: No Fluctuance: No Erythema: No Friable: No Hemosiderin Staining: No Induration: No Mottled: No Localized Edema: Yes Pallor: No Rash: No Rubor: No Scarring: No Temperature / Pain Moisture Temperature: No Abnormality No Abnormalities Noted: No Tenderness on Palpation: Yes Dry / Scaly: No Maceration: No Moist: Yes Wound Preparation Ulcer Cleansing: Other: soap and water, Topical Anesthetic Applied: Other: lidocaine 4%, Treatment Notes Wound #4 (Right, Distal Lower Leg) 1. Cleansed with: Cleanse wound with antibacterial soap and water 3. Peri-wound Care: Moisturizing lotion 4. Dressing Applied: Aquacel Ag 5. Secondary Dressing Applied [name], [name] ([id number]) Bordered Foam  Dressing 7. Secured with Patient to wear own compression stockings Electronic Signature(s) Signed: 01/15/2015 4:12:03 PM By: Regan Lemming BSN, RN Previous Signature: 01/15/2015 8:58:58 AM Version By: Regan Lemming BSN, RN Entered By: Regan Lemming on 01/15/2015 09:08:51 [name] ([id number]) -------------------------------------------------------------------------------- Wound Assessment Details Patient Name: [name] Date of Service: 01/15/2015 8:45 AM Medical Record Number: [id number] Patient Account Number: [id number] Date of Birth/Sex: 1965/11/30 (49 y.o. Male) Treating RN: Baruch Gouty, RN, BSN, [place] Primary Care Physician: [name], Rip Harbour Other Clinician: Referring Physician: [name], MELINDA Treating Physician/Extender: Frann Rider in Treatment: 2 Wound Status Wound Number: 6 Primary Venous Leg Ulcer Etiology: Wound Location: Right Lower Leg - Dorsal, Inferior Wound Open Status: Wounding Event: Blister Comorbid Lymphedema, Congestive Heart Date Acquired: 12/13/2014 History: Failure, Hypertension, Type II Weeks Of Treatment: 2 Diabetes, Gout Clustered Wound: No Photos Photo Uploaded By: Regan Lemming on 01/15/2015 12:11:03 Wound Measurements Length: (cm) 0 % Reduction i Width: (cm) 0 % Reduction i Depth: (cm) 0 Epithelializa Area: (cm) 0 Tunneling: Volume: (cm) 0 Undermining: n Area: 100% n Volume: 100% tion: Large (67-100%) No No Wound Description Classification: Partial Thickness Diabetic Severity (Wagner): Grade 1 Wound Margin: Flat and Intact Exudate Amount: None Present [name], [name] ([id number]) Foul Odor After Cleansing: No Wound Bed Granulation Amount: None Present (0%) Exposed Structure Necrotic Amount: None Present (0%) Fascia Exposed: No Fat Layer Exposed: No Tendon Exposed: No Muscle Exposed: No Joint Exposed: No Bone Exposed: No Limited to Skin Breakdown Periwound Skin Texture Texture Color No Abnormalities Noted:  No No Abnormalities Noted: No Localized Edema: Yes Temperature / Pain Moisture Temperature: No Abnormality No Abnormalities Noted: No Dry / Scaly: No Maceration: No Moist: No Wound Preparation Ulcer Cleansing: Other: soap and water, Topical Anesthetic Applied: None Electronic Signature(s) Signed: 01/15/2015 8:59:31 AM By: Regan Lemming BSN, RN Entered By: Regan Lemming on 01/15/2015 08:59:31 [name] ([id number]) -------------------------------------------------------------------------------- Vitals Details Patient Name: [name] Date of Service: 01/15/2015 8:45 AM Medical Record Number: [id number] Patient Account Number: [id number] Date of Birth/Sex: 12-07-1965 (49 y.o. Male) Treating RN: Baruch Gouty, RN, BSN, [place] Primary Care Physician: [name], Rip Harbour Other Clinician: Referring Physician: [name], MELINDA Treating Physician/Extender: Frann Rider in Treatment: 2 Vital Signs Time Taken: 08:46 Temperature (F): 98 Height (in): 71 Pulse (bpm): 93 Weight (lbs): 360 Respiratory Rate (breaths/min): 21 Body Mass Index (BMI): 50.2 Blood Pressure (mmHg): 163/95 Reference Range: 80 - 120 mg / dl Electronic Signature(s) Signed: 01/15/2015 4:12:03 PM By: Regan Lemming BSN, RN Entered By: Regan Lemming on 01/15/2015 08:46:26

## 2015-01-17 ENCOUNTER — Ambulatory Visit (INDEPENDENT_AMBULATORY_CARE_PROVIDER_SITE_OTHER): Payer: Managed Care, Other (non HMO) | Admitting: Family Medicine

## 2015-01-17 ENCOUNTER — Encounter: Payer: Self-pay | Admitting: Family Medicine

## 2015-01-17 VITALS — BP 143/85 | HR 82 | Temp 98.5°F | Ht 70.0 in | Wt 392.0 lb

## 2015-01-17 DIAGNOSIS — I10 Essential (primary) hypertension: Secondary | ICD-10-CM | POA: Diagnosis not present

## 2015-01-17 DIAGNOSIS — E559 Vitamin D deficiency, unspecified: Secondary | ICD-10-CM | POA: Diagnosis not present

## 2015-01-17 DIAGNOSIS — M1A39X0 Chronic gout due to renal impairment, multiple sites, without tophus (tophi): Secondary | ICD-10-CM | POA: Diagnosis not present

## 2015-01-17 DIAGNOSIS — Z720 Tobacco use: Secondary | ICD-10-CM

## 2015-01-17 DIAGNOSIS — R809 Proteinuria, unspecified: Secondary | ICD-10-CM | POA: Diagnosis not present

## 2015-01-17 DIAGNOSIS — E1165 Type 2 diabetes mellitus with hyperglycemia: Secondary | ICD-10-CM | POA: Diagnosis not present

## 2015-01-17 DIAGNOSIS — N183 Chronic kidney disease, stage 3 unspecified: Secondary | ICD-10-CM

## 2015-01-17 DIAGNOSIS — I420 Dilated cardiomyopathy: Secondary | ICD-10-CM | POA: Diagnosis not present

## 2015-01-17 DIAGNOSIS — Z23 Encounter for immunization: Secondary | ICD-10-CM | POA: Diagnosis not present

## 2015-01-17 DIAGNOSIS — F1910 Other psychoactive substance abuse, uncomplicated: Secondary | ICD-10-CM | POA: Diagnosis not present

## 2015-01-17 DIAGNOSIS — R7981 Abnormal blood-gas level: Secondary | ICD-10-CM

## 2015-01-17 DIAGNOSIS — Z5181 Encounter for therapeutic drug level monitoring: Secondary | ICD-10-CM

## 2015-01-17 DIAGNOSIS — G4700 Insomnia, unspecified: Secondary | ICD-10-CM

## 2015-01-17 DIAGNOSIS — IMO0001 Reserved for inherently not codable concepts without codable children: Secondary | ICD-10-CM

## 2015-01-17 MED ORDER — BLOOD GLUCOSE MONITOR KIT: PACK | Status: DC

## 2015-01-17 NOTE — Assessment & Plan Note (Addendum)
Encouraged him to lose weight; explained that if he could lose 100 pounds, he would likely feel so much better, would like not need as much medicine, etc.; see AVS

## 2015-01-17 NOTE — Assessment & Plan Note (Signed)
Noted on cardiologist's labs done earlier this month; I was going to order sleep study, and then patient says cardiologist has set him up for a sleep study already; stressed importance of following through with that

## 2015-01-17 NOTE — Assessment & Plan Note (Signed)
Seeing Dr. Cloyd Stagers at [facility]; on ACE-I, carvedilol, diuretic

## 2015-01-17 NOTE — Assessment & Plan Note (Addendum)
Check urine, a1c, glucose, lipids today; foot exam by MD today and patient encouraged to check his own feet every night; check FSBS 3x a day for now; if he can lose significant weight, he might not need the insulin; weight loss encouraged; goal LDL less than 100 at least, under 70 would be ideal; cardiologist note reviewed and he is going to dicsuss medicine for lipids at f/u in January

## 2015-01-17 NOTE — Assessment & Plan Note (Signed)
Encouraged patient to quit smoking completely; see AVS

## 2015-01-17 NOTE — Assessment & Plan Note (Signed)
Per cardiology note, goal systolic is less than [id number]; I usually try to keep diabetics under 130, but patient has significant cardiomyopathy, so we'll go with heart doctor's goal; encouraged DASH guidelines, weight loss

## 2015-01-17 NOTE — Assessment & Plan Note (Signed)
Do NOT use sleeping pills; printed out a copy of the Feb 2012 study that discussed 3-fold increased risk of early death among users of sedative hypnotics; explained he could die, fall asleep and not wake up

## 2015-01-17 NOTE — Assessment & Plan Note (Signed)
Check urine today 

## 2015-01-17 NOTE — Patient Instructions (Addendum)
Please do eat yogurt daily or take a probiotic daily for the next month or so We want to replace the healthy germs in the gut If you notice foul, watery diarrhea in the next two months, schedule an appointment RIGHT AWAY You received the flu shot today; it should protect you against the flu virus over the coming months; it will take about two weeks for antibodies to develop; do try to stay away from hospitals, nursing homes, and daycares during peak flu season; taking extra vitamin C daily during flu season may help you avoid getting sick I am hopeful that you will be able to lose 50-100 pounds over the coming year Talk with your heart doctor about what activity you can do and if he agrees with having you lose one to two pounds of fat per week Do NOT starve yourself If you would like to meet with a nutritionist / dietician to help you with weight loss, I will be very happy to make that referral Check out the information at familydoctor.org entitled "What It Takes to Lose Weight" Try to lose between 1-2 pounds per week by taking in fewer calories and burning off more calories You can succeed by limiting portions, limiting foods dense in calories and fat, becoming more active, and drinking 8 glasses of water a day (64 ounces) Don't skip meals, especially breakfast, as skipping meals may alter your metabolism Do not use over-the-counter weight loss pills or gimmicks that claim rapid weight loss A healthy BMI (or body mass index) is between 18.5 and 24.9 You can calculate your ideal BMI at the [place] website ClubMonetize.fr Please do work with your heart doctor about getting a sleep study done as soon as possible Do not use any sleeping pills; see the hand-out we discussed today Check your fingerstick blood sugar three times a day Check your feet every single night and notify me or the wound clinic right away of any sores or places that concern you Try  to follow the DASH guidelines (DASH stands for Dietary Approaches to Stop Hypertension) Try to limit the sodium in your diet.  Ideally, consume less than 1.5 grams (less than 1,500mg ) per day. Do not add salt when cooking or at the table.  Check the sodium amount on labels when shopping, and choose items lower in sodium when given a choice. Avoid or limit foods that already contain a lot of sodium. Eat a diet rich in fruits and vegetables and whole grains.   DASH Eating Plan DASH stands for "Dietary Approaches to Stop Hypertension." The DASH eating plan is a healthy eating plan that has been shown to reduce high blood pressure (hypertension). Additional health benefits may include reducing the risk of type 2 diabetes mellitus, heart disease, and stroke. The DASH eating plan may also help with weight loss. WHAT DO I NEED TO KNOW ABOUT THE DASH EATING PLAN? For the DASH eating plan, you will follow these general guidelines:  Choose foods with a percent daily value for sodium of less than 5% (as listed on the food label).  Use salt-free seasonings or herbs instead of table salt or sea salt.  Check with your health care provider or pharmacist before using salt substitutes.  Eat lower-sodium products, often labeled as "lower sodium" or "no salt added."  Eat fresh foods.  Eat more vegetables, fruits, and low-fat dairy products.  Choose whole grains. Look for the word "whole" as the first word in the ingredient list.  Choose fish and skinless  chicken or [place] more often than red meat. Limit fish, poultry, and meat to 6 oz (170 g) each day.  Limit sweets, desserts, sugars, and sugary drinks.  Choose heart-healthy fats.  Limit cheese to 1 oz (28 g) per day.  Eat more home-cooked food and less restaurant, buffet, and fast food.  Limit fried foods.  Cook foods using methods other than frying.  Limit canned vegetables. If you do use them, rinse them well to decrease the sodium.  When  eating at a restaurant, ask that your food be prepared with less salt, or no salt if possible. WHAT FOODS CAN I EAT? Seek help from a dietitian for individual calorie needs. Grains Whole grain or whole wheat bread. Brown rice. Whole grain or whole wheat pasta. Quinoa, bulgur, and whole grain cereals. Low-sodium cereals. Corn or whole wheat flour tortillas. Whole grain cornbread. Whole grain crackers. Low-sodium crackers. Vegetables Fresh or frozen vegetables (raw, steamed, roasted, or grilled). Low-sodium or reduced-sodium tomato and vegetable juices. Low-sodium or reduced-sodium tomato sauce and paste. Low-sodium or reduced-sodium canned vegetables.  Fruits All fresh, canned (in natural juice), or frozen fruits. Meat and Other Protein Products Ground beef (85% or leaner), grass-fed beef, or beef trimmed of fat. Skinless chicken or [place]. Ground chicken or [place]. Pork trimmed of fat. All fish and seafood. Eggs. Dried beans, peas, or lentils. Unsalted nuts and seeds. Unsalted canned beans. Dairy Low-fat dairy products, such as skim or 1% milk, 2% or reduced-fat cheeses, low-fat ricotta or cottage cheese, or plain low-fat yogurt. Low-sodium or reduced-sodium cheeses. Fats and Oils Tub margarines without trans fats. Light or reduced-fat mayonnaise and salad dressings (reduced sodium). Avocado. Safflower, olive, or canola oils. Natural peanut or almond butter. Other Unsalted popcorn and pretzels. The items listed above may not be a complete list of recommended foods or beverages. Contact your dietitian for more options. WHAT FOODS ARE NOT RECOMMENDED? Grains White bread. White pasta. White rice. Refined cornbread. Bagels and croissants. Crackers that contain trans fat. Vegetables Creamed or fried vegetables. Vegetables in a cheese sauce. Regular canned vegetables. Regular canned tomato sauce and paste. Regular tomato and vegetable juices. Fruits Dried fruits. Canned fruit in light or heavy  syrup. Fruit juice. Meat and Other Protein Products Fatty cuts of meat. Ribs, chicken wings, bacon, sausage, bologna, salami, chitterlings, fatback, hot dogs, bratwurst, and packaged luncheon meats. Salted nuts and seeds. Canned beans with salt. Dairy Whole or 2% milk, cream, half-and-half, and cream cheese. Whole-fat or sweetened yogurt. Full-fat cheeses or blue cheese. Nondairy creamers and whipped toppings. Processed cheese, cheese spreads, or cheese curds. Condiments Onion and garlic salt, seasoned salt, table salt, and sea salt. Canned and packaged gravies. Worcestershire sauce. Tartar sauce. Barbecue sauce. Teriyaki sauce. Soy sauce, including reduced sodium. Steak sauce. Fish sauce. Oyster sauce. Cocktail sauce. Horseradish. Ketchup and mustard. Meat flavorings and tenderizers. Bouillon cubes. Hot sauce. Tabasco sauce. Marinades. Taco seasonings. Relishes. Fats and Oils Butter, stick margarine, lard, shortening, ghee, and bacon fat. Coconut, palm kernel, or palm oils. Regular salad dressings. Other Pickles and olives. Salted popcorn and pretzels. The items listed above may not be a complete list of foods and beverages to avoid. Contact your dietitian for more information. WHERE CAN I FIND MORE INFORMATION? National Heart, Lung, and Blood Institute: travelstabloid.com   This information is not intended to replace advice given to you by your health care provider. Make sure you discuss any questions you have with your health care provider.   Document Released: 01/01/2011 Document  Revised: 02/02/2014 Document Reviewed: 11/16/2012 Elsevier Interactive Patient Education 2016 [employer]. Smoking Hazards Smoking cigarettes is extremely bad for your health. Tobacco smoke has over 200 known poisons in it. It contains the poisonous gases nitrogen oxide and carbon monoxide. There are over 60 chemicals in tobacco smoke that cause cancer. Some of the chemicals found  in cigarette smoke include:   Cyanide.   Benzene.   Formaldehyde.   Methanol (wood alcohol).   Acetylene (fuel used in welding torches).   Ammonia.  Even smoking lightly shortens your life expectancy by several years. You can greatly reduce the risk of medical problems for you and your family by stopping now. Smoking is the most preventable cause of death and disease in our society. Within days of quitting smoking, your circulation improves, you decrease the risk of having a heart attack, and your lung capacity improves. There may be some increased phlegm in the first few days after quitting, and it may take months for your lungs to clear up completely. Quitting for 10 years reduces your risk of developing lung cancer to almost that of a nonsmoker.  WHAT ARE THE RISKS OF SMOKING? Cigarette smokers have an increased risk of many serious medical problems, including:  Lung cancer.   Lung disease (such as pneumonia, bronchitis, and emphysema).   Heart attack and chest pain due to the heart not getting enough oxygen (angina).   Heart disease and peripheral blood vessel disease.   Hypertension.   Stroke.   Oral cancer (cancer of the lip, mouth, or voice box).   Bladder cancer.   Pancreatic cancer.   Cervical cancer.   Pregnancy complications, including premature birth.   Stillbirths and smaller newborn babies, birth defects, and genetic damage to sperm.   Early menopause.   Lower estrogen level for women.   Infertility.   Facial wrinkles.   Blindness.   Increased risk of broken bones (fractures).   Senile dementia.   Stomach ulcers and internal bleeding.   Delayed wound healing and increased risk of complications during surgery. Because of secondhand smoke exposure, children of smokers have an increased risk of the following:   Sudden infant death syndrome (SIDS).   Respiratory infections.   Lung cancer.   Heart disease.    Ear infections.  WHY IS SMOKING ADDICTIVE? Nicotine is the chemical agent in tobacco that is capable of causing addiction or dependence. When you smoke and inhale, nicotine is absorbed rapidly into the bloodstream through your lungs. Both inhaled and noninhaled nicotine may be addictive.  WHAT ARE THE BENEFITS OF QUITTING?  There are many health benefits to quitting smoking. Some are:   The likelihood of developing cancer and heart disease decreases. Health improvements are seen almost immediately.   Blood pressure, pulse rate, and breathing patterns start returning to normal soon after quitting.   People who quit may see an improvement in their overall quality of life.  HOW DO YOU QUIT SMOKING? Smoking is an addiction with both physical and psychological effects, and longtime habits can be hard to change. Your health care provider can recommend:  Programs and community resources, which may include group support, education, or therapy.  Replacement products, such as patches, gum, and nasal sprays. Use these products only as directed. Do not replace cigarette smoking with electronic cigarettes (commonly called e-cigarettes). The safety of e-cigarettes is unknown, and some may contain harmful chemicals. FOR MORE INFORMATION  American Lung Association: www.lung.org  American Cancer Society: www.cancer.org   This information is  not intended to replace advice given to you by your health care provider. Make sure you discuss any questions you have with your health care provider.   Document Released: 02/20/2004 Document Revised: 11/02/2012 Document Reviewed: 07/04/2012 Elsevier Interactive Patient Education [employer].

## 2015-01-17 NOTE — Assessment & Plan Note (Signed)
Check uric acid level 

## 2015-01-17 NOTE — Assessment & Plan Note (Addendum)
Seeing Dr. Rolly Salter; creatinine and GFR just checked by cardiologist earlier this month; not repeated today

## 2015-01-17 NOTE — Progress Notes (Signed)
BP 143/85 mmHg  Pulse 82  Temp(Src) 98.5 F (36.9 C)  Ht '5\' 10"'  (1.778 m)  Wt 392 lb (177.81 kg)  BMI 56.25 kg/m2  SpO2 96%   Subjective:    Patient ID: [name], male    DOB: 1965/12/18, 49 y.o.   MRN: [id number]  HPI: [name] is a 49 y.o. male  Chief Complaint  Patient presents with  . Diabetes    "I need to talk to her about a 100 different things".   . Hypertension    He states he takes Carvedilol 4 a day.  . Hyperlipidemia  . Leg Pain    sores on legs, going to wound care  . Vit D Deficiency    he never filled the Vit D RX  . Insomnia    he wants a sleeping pill   Paitent has not been seen since August 07, 2014 and has not had any labs since then  He has lymphedema, and he had fluid on his right leg; he went to urgent care Friday before Thanksgiving and then started to go to the wound care clinic at the hospital; seen them 2-3 times; he took the antibiotics, 3 different antibiotics; wound not healing because of his diabetes and the fluid  He wanted to ask about Toujeo; he has mild diabetes compared to other people he thinks; if he takes more insulin, he wants to know His blood sugar is already about 140 He almost passed out a week ago because he hadn't eaten anything; he grabbed a pack of crackers and drank orange juice He is not checking sugars; they checked it at the wound clinic on Tuesday; it's been 2 weeks since he checked his sugars at home He has three sores on the lateral right leg; no fevers; no diarrhea He had what sounds like an Haematologist; he couldn't take it, affected his sleep; he is using the compression stockings instead  He wants some sleeping pills; he does not have sleep apnea, does not wear CPAP; he sleeps on pillows propped up; he is not waking up coughing or sputtering; he just can't fall asleep He got into depression; just because of having to do the stockings  Vitamin D deficiency; never took the vitamin D  He went to the  heart doctor; he got bad news there; the heart function is down to 20%; he had to stop drinking so much water; he was drinking way too much, now down to 2 liters a day; that made a big difference; he is on the diuretic; Dr. Charlsie Merles note from Jan 11, 2015 reviewed; he is on 80 mg furosemide BID; his note also says carvedilol 50 mg BID; on lisinopril 20 mg daily  He quit smoking patient says; then he says he still smokes, but not as much; last was a few weeks ago  Relevant past medical, surgical, family and social history reviewed and updated as indicated. Interim medical history since our last visit reviewed. Allergies and medications reviewed and updated.  Review of Systems  Per HPI unless specifically indicated above     Objective:    BP 143/85 mmHg  Pulse 82  Temp(Src) 98.5 F (36.9 C)  Ht '5\' 10"'  (1.778 m)  Wt 392 lb (177.81 kg)  BMI 56.25 kg/m2  SpO2 96%  Wt Readings from Last 3 Encounters:  01/17/15 392 lb (177.81 kg)  08/07/14 387 lb (175.542 kg)  04/04/14 385 lb (174.635 kg)    Physical Exam  Constitutional: He appears well-developed and well-nourished. No distress.  Morbidly obese  HENT:  Head: Atraumatic.  Mouth/Throat: Oropharynx is clear and moist.  Eyes: EOM are normal. Right eye exhibits no discharge. Left eye exhibits no discharge.  Neck: No JVD present.  Thick and obese  Cardiovascular: Normal rate and regular rhythm.   Heart sounds are distant  Pulmonary/Chest: Effort normal and breath sounds normal.  Anterior breath sounds are distant  Abdominal: Soft. Bowel sounds are normal. He exhibits no distension (morbidly obese).  Musculoskeletal: He exhibits edema (bilateral lower extremity edema, 1+).  Neurological: He is alert. He exhibits normal muscle tone.  Skin:  Bandaged wounds on lateral lower right leg; some serous drainage saturated on upper bandage; no foul smell, no proximal erythema  Psychiatric: He has a normal mood and affect. His behavior is normal.  Judgment and thought content normal.   Diabetic Foot Form - Detailed   Diabetic Foot Exam - detailed  Diabetic Foot exam was performed with the following findings:  Yes 01/17/2015 11:27 AM  Visual Foot Exam completed.:  Yes  Can the patient see the bottom of their feet?:  No  Is there swelling or and abnormal foot shape?:  No  Are the toenails long?:  No  Are the toenails thick?:  No  Are the toenails ingrown?:  No    Pulse Foot Exam completed.:  Yes  Right Dorsalis Pedis:  Diminished Left Dorsalis Pedis:  Diminished  Sensory Foot Exam Completed.:  Yes  Swelling:  No  Semmes-Weinstein Monofilament Test  R Site 1-Great Toe:  Pos L Site 1-Great Toe:  Pos  R Site 4:  Pos L Site 4:  Pos  R Site 5:  Pos L Site 5:  Pos        Results for orders placed or performed in visit on 08/07/14  Lipid Panel Piccolo, [employer]  Result Value Ref Range   Cholesterol Piccolo, Waived 110 <200 mg/dL   HDL Chol Piccolo, Waived 42 (L) >59 mg/dL   Triglycerides Piccolo,Waived 159 (H) <150 mg/dL   Chol/HDL Ratio Piccolo,Waive 2.6 mg/dL   LDL Chol Calc Piccolo Waived 36 <100 mg/dL   VLDL Chol Calc Piccolo,Waive 32 (H) <30 mg/dL  Bayer DCA Hb A1c Waived  Result Value Ref Range   Bayer DCA Hb A1c Waived 9.2 (H) <7.0 %  Comprehensive metabolic panel  Result Value Ref Range   Glucose 189 (H) 65 - 99 mg/dL   BUN 23 6 - 24 mg/dL   Creatinine, Ser 1.74 (H) 0.76 - 1.27 mg/dL   GFR calc non Af Amer 46 (L) >59 mL/min/1.73   GFR calc Af Amer 53 (L) >59 mL/min/1.73   BUN/Creatinine Ratio 13 9 - 20   Sodium 142 134 - 144 mmol/L   Potassium 4.2 3.5 - 5.2 mmol/L   Chloride 96 (L) 97 - 108 mmol/L   CO2 31 (H) 18 - 29 mmol/L   Calcium 8.9 8.7 - 10.2 mg/dL   Total Protein 6.3 6.0 - 8.5 g/dL   Albumin 3.3 (L) 3.5 - 5.5 g/dL   Globulin, Total 3.0 1.5 - 4.5 g/dL   Albumin/Globulin Ratio 1.1 1.1 - 2.5   Bilirubin Total 0.5 0.0 - 1.2 mg/dL   Alkaline Phosphatase 79 39 - 117 IU/L   AST 11 0 - 40 IU/L   ALT 16 0 -  44 IU/L  Uric acid  Result Value Ref Range   Uric Acid 8.1 3.7 - 8.6 mg/dL  Assessment & Plan:   Problem List Items Addressed This Visit      Cardiovascular and Mediastinum   Dilated cardiomyopathy ([place])    Seeing Dr. Cloyd Stagers at [facility]; on ACE-I, carvedilol, diuretic      Benign hypertension    Per cardiology note, goal systolic is less than 188; I usually try to keep diabetics under 130, but patient has significant cardiomyopathy, so we'll go with heart doctor's goal; encouraged DASH guidelines, weight loss        Endocrine   Uncontrolled type 2 diabetes mellitus with proteinuria or albuminuria - Primary    Check urine, a1c, glucose, lipids today; foot exam by MD today and patient encouraged to check his own feet every night; check FSBS 3x a day for now; if he can lose significant weight, he might not need the insulin; weight loss encouraged; goal LDL less than 100 at least, under 70 would be ideal; cardiologist note reviewed and he is going to dicsuss medicine for lipids at f/u in January      Relevant Orders   Lipid Panel w/o Chol/HDL Ratio   Hgb A1c w/o eAG     Genitourinary   Chronic kidney disease (CKD), stage III (moderate)    Seeing Dr. Rolly Salter; creatinine and GFR just checked by cardiologist earlier this month; not repeated today      Relevant Orders   Microalbumin / creatinine urine ratio     Other   Morbid obesity ([place])    Encouraged him to lose weight; explained that if he could lose 100 pounds, he would likely feel so much better, would like not need as much medicine, etc.; see AVS      Gout    Check uric acid level      Relevant Orders   Uric acid   Proteinuria    Check urine today      Relevant Orders   Microalbumin / creatinine urine ratio   Vitamin D deficiency   Relevant Orders   VITAMIN D 25 Hydroxy (Vit-D Deficiency, Fractures)   Medication monitoring encounter   Elevated CO2 level    Noted on cardiologist's labs done earlier this month; I  was going to order sleep study, and then patient says cardiologist has set him up for a sleep study already; stressed importance of following through with that      Relevant Orders   CBC with Differential/Platelet   Tobacco abuse, episodic    Encouraged patient to quit smoking completely; see AVS      Insomnia    Do NOT use sleeping pills; printed out a copy of the Feb 2012 study that discussed 3-fold increased risk of early death among users of sedative hypnotics; explained he could die, fall asleep and not wake up       Other Visit Diagnoses    Needs flu shot        flu vaccine offered and given today    Encounter for immunization        Need for pneumococcal vaccination        PPSV-23 given today; next booster due after 66th birthday    Relevant Orders    Pneumococcal polysaccharide vaccine 23-valent greater than or equal to 2yo subcutaneous/IM (Completed)        Follow up plan: Return in about 1 month (around 02/17/2015) for 30 minute visit, diabetes; bring your sugar readings with you please.  An after-visit summary was printed and given to the patient at [place].  Please see  the patient instructions which may contain other information and recommendations beyond what is mentioned above in the assessment and plan.  Meds ordered this encounter  Medications  . blood glucose meter kit and supplies KIT    Sig: Brand of choice; LON 99 months; check FSBS 3x a day; E11.65; disp one meter with 100 strips + 1 refill of strips    Dispense:  1 each    Refill:  0    Order Specific Question:  Number of strips    Answer:  100    Order Specific Question:  Number of lancets    Answer:  100   Orders Placed This Encounter  Procedures  . Flu Vaccine QUAD 36+ mos IM  . Pneumococcal polysaccharide vaccine 23-valent greater than or equal to 2yo subcutaneous/IM  . CBC with Differential/Platelet  . Lipid Panel w/o Chol/HDL Ratio  . Hgb A1c w/o eAG  . VITAMIN D 25 Hydroxy (Vit-D  Deficiency, Fractures)  . Microalbumin / creatinine urine ratio  . Uric acid

## 2015-01-18 ENCOUNTER — Telehealth: Payer: Self-pay | Admitting: Family Medicine

## 2015-01-18 LAB — HGB A1C W/O EAG: Hgb A1c MFr Bld: 9.1 % — ABNORMAL HIGH (ref 4.8–5.6)

## 2015-01-18 LAB — CBC WITH DIFFERENTIAL/PLATELET
BASOS ABS: 0 10*3/uL (ref 0.0–0.2)
Basos: 0 %
EOS (ABSOLUTE): 0.1 10*3/uL (ref 0.0–0.4)
Eos: 2 %
Hematocrit: 47.6 % (ref 37.5–51.0)
Hemoglobin: 14.4 g/dL (ref 12.6–17.7)
IMMATURE GRANS (ABS): 0 10*3/uL (ref 0.0–0.1)
IMMATURE GRANULOCYTES: 0 %
LYMPHS: 28 %
Lymphocytes Absolute: 1.6 10*3/uL (ref 0.7–3.1)
MCH: 27.6 pg (ref 26.6–33.0)
MCHC: 30.3 g/dL — ABNORMAL LOW (ref 31.5–35.7)
MCV: 91 fL (ref 79–97)
Monocytes Absolute: 0.6 10*3/uL (ref 0.1–0.9)
Monocytes: 11 %
NEUTROS ABS: 3.4 10*3/uL (ref 1.4–7.0)
NEUTROS PCT: 59 %
PLATELETS: 167 10*3/uL (ref 150–379)
RBC: 5.21 x10E6/uL (ref 4.14–5.80)
RDW: 15.7 % — ABNORMAL HIGH (ref 12.3–15.4)
WBC: 5.8 10*3/uL (ref 3.4–10.8)

## 2015-01-18 LAB — URIC ACID: Uric Acid: 6 mg/dL (ref 3.7–8.6)

## 2015-01-18 LAB — LIPID PANEL W/O CHOL/HDL RATIO
CHOLESTEROL TOTAL: 105 mg/dL (ref 100–199)
HDL: 48 mg/dL (ref >39)
LDL Calculated: 44 mg/dL (ref 0–99)
Triglycerides: 65 mg/dL (ref 0–149)
VLDL CHOLESTEROL CAL: 13 mg/dL (ref 5–40)

## 2015-01-18 LAB — MICROALBUMIN / CREATININE URINE RATIO
Creatinine, Urine: 35.4 mg/dL
MICROALB/CREAT RATIO: 767.2 mg/g{creat} — AB (ref 0.0–30.0)
MICROALBUM., U, RANDOM: 271.6 ug/mL

## 2015-01-18 LAB — VITAMIN D 25 HYDROXY (VIT D DEFICIENCY, FRACTURES): Vit D, 25-Hydroxy: 11 ng/mL — ABNORMAL LOW (ref 30.0–100.0)

## 2015-01-18 MED ORDER — VITAMIN D (ERGOCALCIFEROL) 1.25 MG (50000 UNIT) PO CAPS: 50000.0000 [IU] | ORAL_CAPSULE | ORAL | Status: DC

## 2015-01-18 NOTE — Telephone Encounter (Signed)
Vitamin D is very low Please let patient know that we'll start him on some vitamin D that he can take once a week for eight weeks, then once a month after that His A1c has come down a lot from the last check; it was over 16 and now it's 9.1 Per the medicine list yesterday, he was taking fifteen units of long-acting insulin (Toujeo) once a day; if that was correct, let's increase the Toujeo to eighteen units once a day He's spilling protein in his urine, so please do recommend he follow-up with his kidney doctor about that

## 2015-01-22 ENCOUNTER — Encounter (HOSPITAL_BASED_OUTPATIENT_CLINIC_OR_DEPARTMENT_OTHER): Payer: Managed Care, Other (non HMO) | Admitting: General Surgery

## 2015-01-22 DIAGNOSIS — E11622 Type 2 diabetes mellitus with other skin ulcer: Secondary | ICD-10-CM | POA: Diagnosis not present

## 2015-01-22 DIAGNOSIS — I83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: Secondary | ICD-10-CM | POA: Insufficient documentation

## 2015-01-22 DIAGNOSIS — L97219 Non-pressure chronic ulcer of right calf with unspecified severity: Principal | ICD-10-CM

## 2015-01-22 NOTE — Progress Notes (Signed)
seeiheal 

## 2015-01-23 NOTE — Progress Notes (Signed)
BUNNIE, LEDERMAN ([id number]) Visit Report for 01/22/2015 Arrival Information Details Patient Name: [name], [name]. Date of Service: 01/22/2015 11:30 AM Medical Record Number: [id number] Patient Account Number: [id number] Date of Birth/Sex: 1965/06/20 (49 y.o. Male) Treating RN: Baruch Gouty, RN, BSN, Velva Harman Primary Care Physician: [name], Rip Harbour Other Clinician: Referring Physician: [name], [place] Treating Physician/Extender: Benjaman Pott in Treatment: 3 Visit Information History Since Last Visit Any new allergies or adverse reactions: No Patient Arrived: Ambulatory Had a fall or experienced change in No Arrival Time: 11:32 activities of daily living that may affect Accompanied By: self risk of falls: Transfer Assistance: None Signs or symptoms of abuse/neglect since last No Patient Requires Transmission-Based No visito Precautions: Hospitalized since last visit: No Patient Has Alerts: Yes Has Dressing in Place as Prescribed: Yes Patient Alerts: DM II Has Compression in Place as Prescribed: Yes Pain Present Now: No Electronic Signature(s) Signed: 01/22/2015 5:06:20 PM By: Regan Lemming BSN, RN Entered By: Regan Lemming on 01/22/2015 11:34:44 [name] ([id number]) -------------------------------------------------------------------------------- Encounter Discharge Information Details Patient Name: [name] Date of Service: 01/22/2015 11:30 AM Medical Record Number: [id number] Patient Account Number: [id number] Date of Birth/Sex: [birth year] (49 y.o. Male) Treating RN: Baruch Gouty, RN, BSN, Velva Harman Primary Care Physician: [name], Rip Harbour Other Clinician: Referring Physician: [name], [place] Treating Physician/Extender: Benjaman Pott in Treatment: 3 Encounter Discharge Information Items Discharge Pain Level: 0 Discharge Condition: Stable Ambulatory Status: Ambulatory Discharge Destination: Home Private Transportation: Auto Accompanied By: self Schedule  Follow-up Appointment: No Medication Reconciliation completed and No provided to Patient/Care [name]: Clinical Summary of Care: Electronic Signature(s) Signed: 01/22/2015 12:12:06 PM By: Judene Companion MD Entered By: Judene Companion on 01/22/2015 12:12:06 [name] ([id number]) -------------------------------------------------------------------------------- Lower Extremity Assessment Details Patient Name: [name] Date of Service: 01/22/2015 11:30 AM Medical Record Number: [id number] Patient Account Number: [id number] Date of Birth/Sex: 02-15-1965 (49 y.o. Male) Treating RN: Baruch Gouty, RN, BSN, Velva Harman Primary Care Physician: [name], Rip Harbour Other Clinician: Referring Physician: [name], MELINDA Treating Physician/Extender: Benjaman Pott in Treatment: 3 Edema Assessment Assessed: [Left: No] [Right: No] E[Left: dema] [Right: :] Calf Left: Right: Point of Measurement: 34 cm From Medial Instep cm 52.4 cm Ankle Left: Right: Point of Measurement: 9 cm From Medial Instep cm 32.4 cm Vascular Assessment Claudication: Claudication Assessment [Right:None] Pulses: Posterior Tibial Dorsalis Pedis Palpable: [Right:Yes] Extremity colors, hair growth, and conditions: Extremity Color: [Right:Hyperpigmented] Hair Growth on Extremity: [Right:Yes] Temperature of Extremity: [Right:Warm] Capillary Refill: [Right:< 3 seconds] Toe Nail Assessment Left: Right: Thick: Yes Discolored: Yes Deformed: No Improper Length and Hygiene: No Electronic Signature(s) Signed: 01/22/2015 5:06:20 PM By: Regan Lemming BSN, RN Entered By: Regan Lemming on 01/22/2015 11:32:25 [name] ([name] ([id number]) -------------------------------------------------------------------------------- Multi Wound Chart Details Patient Name: [name] Date of Service: 01/22/2015 11:30 AM Medical Record Number: [id number] Patient Account Number: [id number] Date of Birth/Sex:  [birth year] (49 y.o. Male) Treating RN: Baruch Gouty, RN, BSN, [place] Primary Care Physician: [name], Rip Harbour Other Clinician: Referring Physician: [name], MELINDA Treating Physician/Extender: Judene Companion Weeks in Treatment: 3 Vital Signs Height(in): 71 Pulse(bpm): 89 Weight(lbs): 360 Blood Pressure 156/92 (mmHg): Body Mass Index(BMI): 50 Temperature(F): 98.1 Respiratory Rate 21 (breaths/min): Photos: [2:No Photos] [3:No Photos] [4:No Photos] Wound Location: [2:Right Lower Leg - Lateral, Right Lower Leg - Medial Right Lower Leg - Distal Superior] Wounding Event: [2:Gradually Appeared] [3:Gradually Appeared] [4:Gradually Appeared] Primary Etiology: [2:Venous Leg Ulcer] [3:Venous Leg Ulcer] [4:Venous Leg Ulcer] Comorbid History: [2:Lymphedema, Congestive Lymphedema, Congestive Lymphedema, Congestive Heart Failure, Hypertension, Type II Diabetes,  Gout] [3:Heart Failure, Hypertension, Type II Diabetes, Gout] [4:Heart Failure, Hypertension, Type II Diabetes, Gout] Date Acquired: [2:12/13/2014] [3:12/13/2014] [4:12/13/2014] Weeks of Treatment: [2:3] [3:3] [4:3] Wound Status: [2:Open] [3:Open] [4:Open] Measurements L x W x D 1x1x0.5 [3:2x1.5x0.4] [4:1.5x1.7x1] (cm) Area (cm) : [2:0.785] [3:2.356] [4:2.003] Volume (cm) : [2:0.393] [3:0.942] [4:2.003] % Reduction in Area: [2:0.00%] [3:-25.00%] [4:-30.70%] % Reduction in Volume: -150.30% [3:-149.90%] [4:-554.60%] Position 1 (o'clock): 12 Maximum Distance 1 5 (cm): Tunneling: [2:Yes] [3:No] [4:No] Classification: [2:Full Thickness Without Exposed Support Structures] [3:Full Thickness Without Exposed Support Structures] [4:Full Thickness Without Exposed Support Structures] HBO Classification: [2:Grade 1] [3:Grade 1] [4:Grade 1] Exudate Amount: [2:Large] [3:Medium] [4:Medium] Exudate Type: [2:Purulent] [3:Serosanguineous] [4:Serosanguineous] Exudate Color: [2:yellow, brown, green] [3:red, brown] [4:red, brown] Wound Margin: [2:Flat and Intact]  [3:Flat and Intact] [4:Flat and Intact] Granulation Amount: Small (1-33%) Small (1-33%) Small (1-33%) Granulation Quality: N/A Red, Pink Red, Pink Necrotic Amount: Medium (34-66%) Medium (34-66%) Medium (34-66%) Exposed Structures: Fascia: No Fascia: No Fascia: No Fat: No Fat: No Fat: No Tendon: No Tendon: No Tendon: No Muscle: No Muscle: No Muscle: No Joint: No Joint: No Joint: No Bone: No Bone: No Bone: No Limited to Skin Limited to Skin Limited to Skin Breakdown Breakdown Breakdown Epithelialization: None None None Periwound Skin Texture: Edema: Yes Edema: Yes Edema: Yes Excoriation: No Excoriation: No Induration: No Induration: No Callus: No Callus: No Crepitus: No Crepitus: No Fluctuance: No Fluctuance: No Friable: No Friable: No Rash: No Rash: No Scarring: No Scarring: No Periwound Skin Moist: Yes Moist: Yes Moist: Yes Moisture: Maceration: No Maceration: No Dry/Scaly: No Dry/Scaly: No Periwound Skin Color: No Abnormalities Noted Atrophie Blanche: No Atrophie Blanche: No Cyanosis: No Cyanosis: No Ecchymosis: No Ecchymosis: No Erythema: No Erythema: No Hemosiderin Staining: No Hemosiderin Staining: No Mottled: No Mottled: No Pallor: No Pallor: No Rubor: No Rubor: No Temperature: No Abnormality N/A No Abnormality Tenderness on Yes Yes Yes Palpation: Wound Preparation: Ulcer Cleansing: Other: Ulcer Cleansing: Other: Ulcer Cleansing: Other: soap and water soap and water soap and water Topical Anesthetic Topical Anesthetic Topical Anesthetic Applied: Other: lidocaine Applied: Other: lidocaine Applied: Other: lidocaine 4% 4% 4% Treatment Notes Electronic Signature(s) Signed: 01/22/2015 5:06:20 PM By: Regan Lemming BSN, RN Entered By: Regan Lemming on 01/22/2015 11:53:40 [name] ([id number]) -------------------------------------------------------------------------------- [place] Details Patient Name:  [name] Date of Service: 01/22/2015 11:30 AM Medical Record Number: [id number] Patient Account Number: [id number] Date of Birth/Sex: 12-Sep-1965 (49 y.o. Male) Treating RN: Baruch Gouty, RN, BSN, Velva Harman Primary Care Physician: [name], Rip Harbour Other Clinician: Referring Physician: [name], MELINDA Treating Physician/Extender: Benjaman Pott in Treatment: 3 Active Inactive Abuse / Safety / Falls / Self Care Management Nursing Diagnoses: Potential for falls Goals: Patient will remain injury free Date Initiated: 01/01/2015 Goal Status: Active Patient/caregiver will verbalize/demonstrate measures taken to prevent injury and/or falls Date Initiated: 01/01/2015 Goal Status: Active Interventions: Assess fall risk on admission and as needed Notes: Nutrition Nursing Diagnoses: Imbalanced nutrition Goals: Patient/caregiver agrees to and verbalizes understanding of need to use nutritional supplements and/or vitamins as prescribed Date Initiated: 01/01/2015 Goal Status: Active Patient/caregiver verbalizes understanding of need to maintain therapeutic glucose control per primary care physician Date Initiated: 01/01/2015 Goal Status: Active Patient/caregiver will maintain therapeutic glucose control Date Initiated: 01/01/2015 Goal Status: Active Interventions: [name], [name] ([id number]) Provide education on elevated blood sugars and impact on wound healing Provide education on nutrition Treatment Activities: Education provided on Nutrition : 01/08/2015 Notes: Orientation to the Wound Care Program Nursing Diagnoses: Knowledge deficit related  to the wound healing center program Goals: Patient/caregiver will verbalize understanding of the [place] Date Initiated: 01/01/2015 Goal Status: Active Interventions: Provide education on orientation to the wound center Notes: Pain, Acute or Chronic Nursing Diagnoses: Pain, acute or chronic: actual or  potential Goals: Patient will verbalize adequate pain control and receive pain control interventions during procedures as needed Date Initiated: 01/01/2015 Goal Status: Active Patient/caregiver will verbalize adequate pain control between visits Date Initiated: 01/01/2015 Goal Status: Active Patient/caregiver will verbalize comfort level met Date Initiated: 01/01/2015 Goal Status: Active Interventions: Assess comfort goal upon admission Complete pain assessment as per visit requirements Notes: Wound/Skin Impairment [name], [name] ([id number]) Nursing Diagnoses: Impaired tissue integrity Knowledge deficit related to smoking impact on wound healing Knowledge deficit related to ulceration/compromised skin integrity Goals: Patient will demonstrate a reduced rate of smoking or cessation of smoking Date Initiated: 01/01/2015 Goal Status: Active Patient/caregiver will verbalize understanding of skin care regimen Date Initiated: 01/01/2015 Goal Status: Active Ulcer/skin breakdown will have a volume reduction of 30% by week 4 Date Initiated: 01/01/2015 Goal Status: Active Ulcer/skin breakdown will have a volume reduction of 50% by week 8 Date Initiated: 01/01/2015 Goal Status: Active Ulcer/skin breakdown will have a volume reduction of 80% by week 12 Date Initiated: 01/01/2015 Goal Status: Active Interventions: Assess patient/caregiver ability to obtain necessary supplies Assess patient/caregiver ability to perform ulcer/skin care regimen upon admission and as needed Notes: Electronic Signature(s) Signed: 01/22/2015 5:06:20 PM By: Regan Lemming BSN, RN Entered By: Regan Lemming on 01/22/2015 11:53:31 [name] ([id number]) -------------------------------------------------------------------------------- Pain Assessment Details Patient Name: [name] Date of Service: 01/22/2015 11:30 AM Medical Record Number: [id number] Patient Account Number: [id number] Date of  Birth/Sex: 1965-12-22 (49 y.o. Male) Treating RN: Baruch Gouty, RN, BSN, Velva Harman Primary Care Physician: [name], Rip Harbour Other Clinician: Referring Physician: [name], [place] Treating Physician/Extender: Benjaman Pott in Treatment: 3 Active Problems Location of Pain Severity and Description of Pain Patient Has Paino No Site Locations Pain Management and Medication Current Pain Management: Electronic Signature(s) Signed: 01/22/2015 5:06:20 PM By: Regan Lemming BSN, RN Entered By: Regan Lemming on 01/22/2015 11:34:51 [name] ([id number]) -------------------------------------------------------------------------------- Patient/Caregiver Education Details Patient Name: [name] Date of Service: 01/22/2015 11:30 AM Medical Record Number: [id number] Patient Account Number: [id number] Date of Birth/Gender: Mar 16, 1965 (49 y.o. Male) Treating RN: Baruch Gouty, RN, BSN, [place] Primary Care Physician: [name], Rip Harbour Other Clinician: Referring Physician: [name], [place] Treating Physician/Extender: Benjaman Pott in Treatment: 3 Education Assessment Education Provided To: Patient Education Topics Provided Elevated Blood Sugar/ Impact on Healing: Methods: Explain/Verbal Responses: State content correctly Nutrition: Methods: Explain/Verbal Responses: State content correctly Welcome To [facility]: Methods: Explain/Verbal Responses: State content correctly Electronic Signature(s) Signed: 01/22/2015 12:12:14 PM By: Judene Companion MD Entered By: Judene Companion on 01/22/2015 12:12:14 [name] ([id number]) -------------------------------------------------------------------------------- Wound Assessment Details Patient Name: [name] Date of Service: 01/22/2015 11:30 AM Medical Record Number: [id number] Patient Account Number: [id number] Date of Birth/Sex: 04-Jul-1965 (49 y.o. Male) Treating RN: Baruch Gouty, RN, BSN, Velva Harman Primary Care Physician: [name], Rip Harbour Other  Clinician: Referring Physician: [name], MELINDA Treating Physician/Extender: Judene Companion Weeks in Treatment: 3 Wound Status Wound Number: 2 Primary Venous Leg Ulcer Etiology: Wound Location: Right Lower Leg - Lateral, Superior Wound Open Status: Wounding Event: Gradually Appeared Comorbid Lymphedema, Congestive Heart Date Acquired: 12/13/2014 History: Failure, Hypertension, Type II Weeks Of Treatment: 3 Diabetes, Gout Clustered Wound: No Photos Photo Uploaded By: Regan Lemming on 01/22/2015 16:39:52 Wound Measurements Length: (cm) 1  Width: (cm) 1 Depth: (cm) 0.5 Area: (cm) 0.785 Volume: (cm) 0.393 % Reduction in Area: 0% % Reduction in Volume: -150.3% Epithelialization: None Tunneling: Yes Position (o'clock): 12 Maximum Distance: (cm) 5 Wound Description Full Thickness Without Classification: Exposed Support Structures [name], GAHM. ([id number]) Foul Odor After Cleansing: No Diabetic Severity Grade 1 (Wagner): Wound Margin: Flat and Intact Exudate Amount: Large Exudate Type: Purulent Exudate Color: yellow, brown, green Wound Bed Granulation Amount: Small (1-33%) Exposed Structure Necrotic Amount: Medium (34-66%) Fascia Exposed: No Necrotic Quality: Adherent Slough Fat Layer Exposed: No Tendon Exposed: No Muscle Exposed: No Joint Exposed: No Bone Exposed: No Limited to Skin Breakdown Periwound Skin Texture Texture Color No Abnormalities Noted: No No Abnormalities Noted: No Localized Edema: Yes Temperature / Pain Moisture Temperature: No Abnormality No Abnormalities Noted: No Tenderness on Palpation: Yes Moist: Yes Wound Preparation Ulcer Cleansing: Other: soap and water, Topical Anesthetic Applied: Other: lidocaine 4%, Treatment Notes Wound #2 (Right, Lateral, Superior Lower Leg) 1. Cleansed with: Cleanse wound with antibacterial soap and water 3. Peri-wound Care: Moisturizing lotion 4. Dressing Applied: Aquacel Ag 5. Secondary Dressing  Applied Bordered Foam Dressing 7. Secured with Patient to wear own compression stockings Electronic Signature(s) Signed: 01/22/2015 11:48:42 AM By: Regan Lemming BSN, RN Entered By: Regan Lemming on 01/22/2015 11:48:41 [name] ([id number]) -------------------------------------------------------------------------------- Wound Assessment Details Patient Name: [name] Date of Service: 01/22/2015 11:30 AM Medical Record Number: [id number] Patient Account Number: [id number] Date of Birth/Sex: 02/14/1965 (49 y.o. Male) Treating RN: Baruch Gouty, RN, BSN, Velva Harman Primary Care Physician: [name], Rip Harbour Other Clinician: Referring Physician: [name], MELINDA Treating Physician/Extender: Benjaman Pott in Treatment: 3 Wound Status Wound Number: 3 Primary Venous Leg Ulcer Etiology: Wound Location: Right Lower Leg - Medial Wound Open Wounding Event: Gradually Appeared Status: Date Acquired: 12/13/2014 Comorbid Lymphedema, Congestive Heart Weeks Of Treatment: 3 History: Failure, Hypertension, Type II Clustered Wound: No Diabetes, Gout Photos Photo Uploaded By: Regan Lemming on 01/22/2015 16:40:13 Wound Measurements Length: (cm) 2 Width: (cm) 1.5 Depth: (cm) 0.4 Area: (cm) 2.356 Volume: (cm) 0.942 % Reduction in Area: -25% % Reduction in Volume: -149.9% Epithelialization: None Tunneling: No Undermining: No Wound Description Full Thickness Without Exposed Foul Odor A Classification: Support Structures Diabetic Severity Grade 1 (Wagner): [name], [name] ([id number]) fter Cleansing: No Wound Margin: Flat and Intact Exudate Amount: Medium Exudate Type: Serosanguineous Exudate Color: red, brown Wound Bed Granulation Amount: Small (1-33%) Exposed Structure Granulation Quality: Red, Pink Fascia Exposed: No Necrotic Amount: Medium (34-66%) Fat Layer Exposed: No Necrotic Quality: Adherent Slough Tendon Exposed: No Muscle Exposed: No Joint Exposed: No Bone  Exposed: No Limited to Skin Breakdown Periwound Skin Texture Texture Color No Abnormalities Noted: No No Abnormalities Noted: No Callus: No Atrophie Blanche: No Crepitus: No Cyanosis: No Excoriation: No Ecchymosis: No Fluctuance: No Erythema: No Friable: No Hemosiderin Staining: No Induration: No Mottled: No Localized Edema: Yes Pallor: No Rash: No Rubor: No Scarring: No Temperature / Pain Moisture Tenderness on Palpation: Yes No Abnormalities Noted: No Dry / Scaly: No Maceration: No Moist: Yes Wound Preparation Ulcer Cleansing: Other: soap and water, Topical Anesthetic Applied: Other: lidocaine 4%, Treatment Notes Wound #3 (Right, Medial Lower Leg) 1. Cleansed with: Cleanse wound with antibacterial soap and water 3. Peri-wound Care: Moisturizing lotion 4. Dressing Applied: Aquacel Ag 5. Secondary Dressing Applied [name], [name] ([id number]) Bordered Foam Dressing 7. Secured with Patient to wear own compression stockings Electronic Signature(s) Signed: 01/22/2015 11:49:02 AM By: Regan Lemming BSN, RN Entered By: Regan Lemming on 01/22/2015  [place] ([id number]) -------------------------------------------------------------------------------- Wound Assessment Details Patient Name: [name], [name] Date of Service: 01/22/2015 11:30 AM Medical Record Number: [id number] Patient Account Number: [id number] Date of Birth/Sex: Dec 08, 1965 (49 y.o. Male) Treating RN: Baruch Gouty, RN, BSN, Velva Harman Primary Care Physician: [name], Rip Harbour Other Clinician: Referring Physician: [name], MELINDA Treating Physician/Extender: Benjaman Pott in Treatment: 3 Wound Status Wound Number: 4 Primary Venous Leg Ulcer Etiology: Wound Location: Right Lower Leg - Distal Wound Open Wounding Event: Gradually Appeared Status: Date Acquired: 12/13/2014 Comorbid Lymphedema, Congestive Heart Weeks Of Treatment: 3 History: Failure, Hypertension, Type II Clustered Wound:  No Diabetes, Gout Photos Photo Uploaded By: Regan Lemming on 01/22/2015 16:40:52 Wound Measurements Length: (cm) 1.5 Width: (cm) 1.7 Depth: (cm) 1 Area: (cm) 2.003 Volume: (cm) 2.003 % Reduction in Area: -30.7% % Reduction in Volume: -554.6% Epithelialization: None Tunneling: No Undermining: No Wound Description Full Thickness Without Exposed Foul Odor A Classification: Support Structures Diabetic Severity Grade 1 (Wagner): [name], [name] ([id number]) fter Cleansing: No Wound Margin: Flat and Intact Exudate Amount: Medium Exudate Type: Serosanguineous Exudate Color: red, brown Wound Bed Granulation Amount: Small (1-33%) Exposed Structure Granulation Quality: Red, Pink Fascia Exposed: No Necrotic Amount: Medium (34-66%) Fat Layer Exposed: No Necrotic Quality: Adherent Slough Tendon Exposed: No Muscle Exposed: No Joint Exposed: No Bone Exposed: No Limited to Skin Breakdown Periwound Skin Texture Texture Color No Abnormalities Noted: No No Abnormalities Noted: No Callus: No Atrophie Blanche: No Crepitus: No Cyanosis: No Excoriation: No Ecchymosis: No Fluctuance: No Erythema: No Friable: No Hemosiderin Staining: No Induration: No Mottled: No Localized Edema: Yes Pallor: No Rash: No Rubor: No Scarring: No Temperature / Pain Moisture Temperature: No Abnormality No Abnormalities Noted: No Tenderness on Palpation: Yes Dry / Scaly: No Maceration: No Moist: Yes Wound Preparation Ulcer Cleansing: Other: soap and water, Topical Anesthetic Applied: Other: lidocaine 4%, Treatment Notes Wound #4 (Right, Distal Lower Leg) 1. Cleansed with: Cleanse wound with antibacterial soap and water 3. Peri-wound Care: Moisturizing lotion 4. Dressing Applied: Aquacel Ag 5. Secondary Dressing Applied [name], [name] ([id number]) Bordered Foam Dressing 7. Secured with Patient to wear own compression stockings Electronic Signature(s) Signed: 01/22/2015  11:49:18 AM By: Regan Lemming BSN, RN Entered By: Regan Lemming on 01/22/2015 11:49:18 [name] ([id number]) -------------------------------------------------------------------------------- Vitals Details Patient Name: [name] Date of Service: 01/22/2015 11:30 AM Medical Record Number: [id number] Patient Account Number: [id number] Date of Birth/Sex: 1965-08-02 (49 y.o. Male) Treating RN: Baruch Gouty, RN, BSN, [place] Primary Care Physician: [name], Rip Harbour Other Clinician: Referring Physician: [name], [place] Treating Physician/Extender: Benjaman Pott in Treatment: 3 Vital Signs Time Taken: 11:35 Temperature (F): 98.1 Height (in): 71 Pulse (bpm): 89 Weight (lbs): 360 Respiratory Rate (breaths/min): 21 Body Mass Index (BMI): 50.2 Blood Pressure (mmHg): 156/92 Reference Range: 80 - 120 mg / dl Electronic Signature(s) Signed: 01/22/2015 5:06:20 PM By: Regan Lemming BSN, RN Entered By: Regan Lemming on 01/22/2015 11:36:51

## 2015-01-24 ENCOUNTER — Ambulatory Visit: Payer: Managed Care, Other (non HMO) | Admitting: General Surgery

## 2015-01-24 NOTE — Progress Notes (Signed)
DEEP, SIMERSON ([id number]) Visit Report for 01/22/2015 Chief Complaint Document Details Patient Name: [name], [name] 01/22/2015 11:30 Date of Service: AM Medical Record [id number] Number: Patient Account Number: [id number] [birth year] (49 y.o. Treating RN: Baruch Gouty, RN, BSN, Velva Harman Date of Birth/Sex: Male) Other Clinician: Primary Care Physician: [name], [place], [place] Referring Physician: [name], MELINDA Physician/Extender: Weeks in Treatment: 3 Information Obtained from: Patient Chief Complaint Patients presents for treatment of an open diabetic ulcer over the right lower extremity with swelling of both legs for about 1 month. Electronic Signature(s) Signed: 01/22/2015 12:09:16 PM By: Judene Companion MD Entered By: Judene Companion on 01/22/2015 12:09:15 [name] ([id number]) -------------------------------------------------------------------------------- Debridement Details Patient Name: [name], [name] 01/22/2015 11:30 Date of Service: AM Medical Record [id number] Number: Patient Account Number: [id number] 1965-06-20 (49 y.o. Treating RN: Baruch Gouty, RN, BSN, Velva Harman Date of Birth/Sex: Male) Other Clinician: Primary Care Physician: [name], [place], [place] Referring Physician: [name], MELINDA Physician/Extender: Weeks in Treatment: 3 Debridement Performed for Wound #2 Right,Lateral,Superior Lower Leg Assessment: Performed By: Physician Judene Companion, MD Debridement: Debridement Pre-procedure Yes Verification/Time Out Taken: Start Time: 11:50 Pain Control: Lidocaine 4% Topical Solution Level: Skin/Subcutaneous Tissue Total Area Debrided (L x 1 (cm) x 1 (cm) = 1 (cm) W): Tissue and other Non-Viable, Fibrin/Slough, Subcutaneous material debrided: Instrument: Curette Bleeding: Moderate Hemostasis Achieved: Pressure End Time: 11:54 Procedural Pain: 0 Post Procedural Pain: 0 Response to Treatment: Procedure was tolerated well Post  Debridement Measurements of Total Wound Length: (cm) 1 Width: (cm) 1 Depth: (cm) 0.5 Volume: (cm) 0.393 Post Procedure Diagnosis Same as Pre-procedure Electronic Signature(s) Signed: 01/22/2015 5:06:20 PM By: Regan Lemming BSN, RN Signed: 01/24/2015 8:37:21 AM By: Judene Companion MD Entered By: Regan Lemming on 01/22/2015 11:54:36 [name] ([id number]) -------------------------------------------------------------------------------- Debridement Details Patient Name: [name], [name]. 01/22/2015 11:30 Date of Service: AM Medical Record [id number] Number: Patient Account Number: [id number] 1965-08-11 (49 y.o. Treating RN: Baruch Gouty, RN, BSN, Velva Harman Date of Birth/Sex: Male) Other Clinician: Primary Care Physician: [name], [place], [place] Referring Physician: [name], MELINDA Physician/Extender: Weeks in Treatment: 3 Debridement Performed for Wound #3 Right,Medial Lower Leg Assessment: Performed By: Physician Judene Companion, MD Debridement: Debridement Pre-procedure Yes Verification/Time Out Taken: Start Time: 11:54 Pain Control: Lidocaine 4% Topical Solution Level: Skin/Subcutaneous Tissue Total Area Debrided (L x 2 (cm) x 1.5 (cm) = 3 (cm) W): Tissue and other Non-Viable, Fibrin/Slough, Subcutaneous material debrided: Instrument: Curette Bleeding: Moderate Hemostasis Achieved: Pressure End Time: 11:57 Procedural Pain: 0 Post Procedural Pain: 0 Response to Treatment: Procedure was tolerated well Post Debridement Measurements of Total Wound Length: (cm) 2 Width: (cm) 1.5 Depth: (cm) 0.4 Volume: (cm) 0.942 Post Procedure Diagnosis Same as Pre-procedure Electronic Signature(s) Signed: 01/22/2015 5:06:20 PM By: Regan Lemming BSN, RN Signed: 01/24/2015 8:37:21 AM By: Judene Companion MD Entered By: Regan Lemming on 01/22/2015 11:55:17 [name] ([id number]) -------------------------------------------------------------------------------- Debridement  Details Patient Name: [name], [name]. 01/22/2015 11:30 Date of Service: AM Medical Record [id number] Number: Patient Account Number: [id number] 15-Feb-1965 (49 y.o. Treating RN: Baruch Gouty, RN, BSN, Velva Harman Date of Birth/Sex: Male) Other Clinician: Primary Care Physician: [name], [place], [place] Referring Physician: [name], MELINDA Physician/Extender: Weeks in Treatment: 3 Debridement Performed for Wound #4 Right,Distal Lower Leg Assessment: Performed By: Physician Judene Companion, MD Debridement: Debridement Pre-procedure Yes Verification/Time Out Taken: Start Time: 11:57 Pain Control: Lidocaine 4% Topical Solution Level: Skin/Subcutaneous Tissue Total Area Debrided (L x 1.5 (cm) x 1.7 (cm) = 2.55 (cm) W): Tissue and other Non-Viable, Fibrin/Slough, Subcutaneous material  debrided: Instrument: Curette Bleeding: Moderate Hemostasis Achieved: Pressure End Time: 12:00 Procedural Pain: 0 Post Procedural Pain: 0 Response to Treatment: Procedure was tolerated well Post Debridement Measurements of Total Wound Length: (cm) 1.5 Width: (cm) 1.6 Depth: (cm) 1 Volume: (cm) 1.885 Post Procedure Diagnosis Same as Pre-procedure Electronic Signature(s) Signed: 01/22/2015 5:06:20 PM By: Regan Lemming BSN, RN Signed: 01/24/2015 8:37:21 AM By: Judene Companion MD Entered By: Regan Lemming on 01/22/2015 11:56:00 [name] ([id number]) -------------------------------------------------------------------------------- HPI Details Patient Name: [name], [name] 01/22/2015 11:30 Date of Service: AM Medical Record [id number] Number: Patient Account Number: [id number] 10/13/1965 (49 y.o. Treating RN: Baruch Gouty, RN, BSN, Velva Harman Date of Birth/Sex: Male) Other Clinician: Primary Care Physician: [name], [place], [place] Referring Physician: [name], MELINDA Physician/Extender: Weeks in Treatment: 3 History of Present Illness Location: Started getting pustules and boils on  his right lower extremity along with swelling and was treated for this Quality: Patient reports experiencing a dull pain to affected area(s). Severity: Patient states wound are getting worse. Duration: Patient has had the wound for < 4 weeks prior to presenting for treatment Timing: Pain in wound is Intermittent (comes and goes Context: The wound appeared gradually over time Modifying Factors: Other treatment(s) tried include: local care and antibiotics and some compression stockings. Associated Signs and Symptoms: Patient reports having difficulty standing for long periods. HPI Description: 49 year old gentleman who has type 2 diabetes mellitus and morbid obesity has been having cellulitis and abscess of the right lower leg for the last 3 weeks. He has had multiple antibiotics and an IandD and over the last 10 days has been on Septra DS and doxycycline and prior to that Keflex. the patient is a poor historian and does not give a definite timeline but says he's been diagnosed with diabetes mellitus for about 8-10 months and has been on insulin. He does not measure his blood sugars regularly nor does he see his PCP regularly. Also has congestive heart failure for which he sees a cardiologist at [facility] but this is going to change as the cardiologist as retired. He is a smoker and smokes several cigars a day and drinks alcohol occasionally. He has been morbidly obese for several years. 01/08/2015 -- the patient complains about having to use his 3 layer compression wrap and has multiple issues with this none of them being medically oriented. He also continues to smoke, has not yet seen his cardiologist or his PCP and in general has continued to not look after himself. 01/15/2015 -- the patient did not allow [place] to put his 3 layer compression wrap last week and has been using his own compression stockings. Continues to smoke. He did see his cardiologist and they have increased his dose of  Lasix, asked him to repeat an echo, lose weight and be compliant with his diabetic control. Electronic Signature(s) Signed: 01/22/2015 12:09:25 PM By: Judene Companion MD Entered By: Judene Companion on 01/22/2015 12:09:25 [name] ([id number]) -------------------------------------------------------------------------------- Physical Exam Details Patient Name: [name], [name] 01/22/2015 11:30 Date of Service: AM Medical Record [id number] Number: Patient Account Number: [id number] 10-27-1965 (49 y.o. Treating RN: Baruch Gouty, RN, BSN, Velva Harman Date of Birth/Sex: Male) Other Clinician: Primary Care Physician: [name], [place], [place] Referring Physician: [name], MELINDA Physician/Extender: Weeks in Treatment: 3 Electronic Signature(s) Signed: 01/22/2015 12:09:33 PM By: Judene Companion MD Entered By: Judene Companion on 01/22/2015 12:09:33 [name] ([id number]) -------------------------------------------------------------------------------- Physician Orders Details Patient Name: [name], [name] 01/22/2015 11:30 Date of Service: AM Medical Record [id number]  Number: Patient Account Number: [id number] 24-Aug-1965 (49 y.o. Treating RN: Baruch Gouty, RN, BSN, Velva Harman Date of Birth/Sex: Male) Other Clinician: Primary Care Physician: [name], [place], [place] Referring Physician: [name], MELINDA Physician/Extender: Weeks in Treatment: 3 Verbal / Phone Orders: Yes Clinician: Afful, RN, BSN, Rita Read Back and Verified: Yes Diagnosis Coding Wound Cleansing Wound #2 Right,Lateral,Superior Lower Leg o Cleanse wound with mild soap and water o May Shower, gently pat wound dry prior to applying new dressing. Wound #3 Right,Medial Lower Leg o Cleanse wound with mild soap and water o May Shower, gently pat wound dry prior to applying new dressing. Wound #4 Right,Distal Lower Leg o Cleanse wound with mild soap and water o May Shower, gently pat wound dry prior  to applying new dressing. Anesthetic Wound #2 Right,Lateral,Superior Lower Leg o Topical Lidocaine 4% cream applied to wound bed prior to debridement Wound #3 Right,Medial Lower Leg o Topical Lidocaine 4% cream applied to wound bed prior to debridement Wound #4 Right,Distal Lower Leg o Topical Lidocaine 4% cream applied to wound bed prior to debridement Primary Wound Dressing Wound #2 Right,Lateral,Superior Lower Leg o Aquacel Ag Wound #3 Right,Medial Lower Leg o Aquacel Ag Wound #4 Right,Distal Lower Leg o Aquacel Ag Secondary Dressing Wound #2 Right,Lateral,Superior Lower Leg NISHANTH, [place]. ([id number]) o Boardered Foam Dressing Wound #3 Right,Medial Lower Leg o Boardered Foam Dressing Wound #4 Right,Distal Lower Leg o Boardered Foam Dressing Dressing Change Frequency Wound #2 Right,Lateral,Superior Lower Leg o Change dressing every other day. Wound #3 Right,Medial Lower Leg o Change dressing every other day. Wound #4 Right,Distal Lower Leg o Change dressing every other day. Follow-up Appointments Wound #2 Right,Lateral,Superior Lower Leg o Return Appointment in 1 week. Wound #3 Right,Medial Lower Leg o Return Appointment in 1 week. Wound #4 Right,Distal Lower Leg o Return Appointment in 1 week. Edema Control Wound #2 Right,Lateral,Superior Lower Leg o Patient to wear own compression stockings o Elevate legs to the level of the heart and pump ankles as often as possible Wound #3 Right,Medial Lower Leg o Patient to wear own compression stockings o Elevate legs to the level of the heart and pump ankles as often as possible Wound #4 Right,Distal Lower Leg o Patient to wear own compression stockings o Elevate legs to the level of the heart and pump ankles as often as possible Electronic Signature(s) Signed: 01/22/2015 5:06:20 PM By: Regan Lemming BSN, RN Signed: 01/24/2015 8:37:21 AM By: Judene Companion MD Entered By: Regan Lemming on 01/22/2015 12:08:47 [name], [name] ([id number], [name] ([id number]) -------------------------------------------------------------------------------- Problem List Details Patient Name: [name], [name] 01/22/2015 11:30 Date of Service: AM Medical Record [id number] Number: Patient Account Number: [id number] [birth year] (49 y.o. Treating RN: Baruch Gouty, RN, BSN, Velva Harman Date of Birth/Sex: Male) Other Clinician: Primary Care Physician: [name], [place] Treating Tenzin Edelman, [place] Referring Physician: [name], MELINDA Physician/Extender: Weeks in Treatment: 3 Active Problems ICD-10 Encounter Code Description Active Date Diagnosis E11.622 Type 2 diabetes mellitus with other skin ulcer 01/01/2015 Yes E66.01 Morbid (severe) obesity due to excess calories 01/01/2015 Yes L02.415 Cutaneous abscess of right lower limb 01/01/2015 Yes L97.222 Non-pressure chronic ulcer of left calf with fat layer 01/01/2015 Yes exposed I89.0 Lymphedema, not elsewhere classified 01/01/2015 Yes I50.40 Unspecified combined systolic (congestive) and diastolic [id number] Yes (congestive) heart failure F17.218 Nicotine dependence, cigarettes, with other nicotine- 01/01/2015 Yes induced disorders Inactive Problems Resolved Problems Electronic Signature(s) Signed: 01/22/2015 12:09:06 PM By: Judene Companion MD Entered By: Judene Companion on 01/22/2015 12:09:05 [name] ([id number]) Elvina Mattes, Herbie [place]  Viona Gilmore ([id number]) -------------------------------------------------------------------------------- Progress Note Details Patient Name: [name], [name] 01/22/2015 11:30 Date of Service: AM Medical Record [id number] Number: Patient Account Number: [id number] 08-26-1965 (49 y.o. Treating RN: Baruch Gouty, RN, BSN, Velva Harman Date of Birth/Sex: Male) Other Clinician: Primary Care Physician: [name], [place], [place] Referring Physician: [name], MELINDA Physician/Extender: Weeks in Treatment: 3 Subjective Chief  Complaint Information obtained from Patient Patients presents for treatment of an open diabetic ulcer over the right lower extremity with swelling of both legs for about 1 month. History of Present Illness (HPI) The following HPI elements were documented for the patient's wound: Location: Started getting pustules and boils on his right lower extremity along with swelling and was treated for this Quality: Patient reports experiencing a dull pain to affected area(s). Severity: Patient states wound are getting worse. Duration: Patient has had the wound for < 4 weeks prior to presenting for treatment Timing: Pain in wound is Intermittent (comes and goes Context: The wound appeared gradually over time Modifying Factors: Other treatment(s) tried include: local care and antibiotics and some compression stockings. Associated Signs and Symptoms: Patient reports having difficulty standing for long periods. 49 year old gentleman who has type 2 diabetes mellitus and morbid obesity has been having cellulitis and abscess of the right lower leg for the last 3 weeks. He has had multiple antibiotics and an IandD and over the last 10 days has been on Septra DS and doxycycline and prior to that Keflex. the patient is a poor historian and does not give a definite timeline but says he's been diagnosed with diabetes mellitus for about 8-10 months and has been on insulin. He does not measure his blood sugars regularly nor does he see his PCP regularly. Also has congestive heart failure for which he sees a cardiologist at [facility] but this is going to change as the cardiologist as retired. He is a smoker and smokes several cigars a day and drinks alcohol occasionally. He has been morbidly obese for several years. 01/08/2015 -- the patient complains about having to use his 3 layer compression wrap and has multiple issues with this none of them being medically oriented. He also continues to smoke, has not yet  seen his cardiologist or his PCP and in general has continued to not look after himself. 01/15/2015 -- the patient did not allow [place] to put his 3 layer compression wrap last week and has been using his own compression stockings. Continues to smoke. He did see his cardiologist and they have increased his dose of Lasix, asked him to repeat an echo, lose weight and be compliant with his diabetic control. [name], [name] ([id number]) Objective Constitutional Vitals Time Taken: 11:35 AM, Height: 71 in, Weight: 360 lbs, BMI: 50.2, Temperature: 98.1 F, Pulse: 89 bpm, Respiratory Rate: 21 breaths/min, Blood Pressure: 156/92 mmHg. Integumentary (Hair, Skin) Wound #2 status is Open. Original cause of wound was Gradually Appeared. The wound is located on the Right,Lateral,Superior Lower Leg. The wound measures 1cm length x 1cm width x 0.5cm depth; 0.785cm^2 area and 0.393cm^3 volume. The wound is limited to skin breakdown. There is tunneling at 12:00 with a maximum distance of 5cm. There is a large amount of purulent drainage noted. The wound margin is flat and intact. There is small (1-33%) granulation within the wound bed. There is a medium (34-66%) amount of necrotic tissue within the wound bed including Adherent Slough. The periwound skin appearance exhibited: Localized Edema, Moist. Periwound temperature was noted as No Abnormality. The periwound has tenderness on  palpation. Wound #3 status is Open. Original cause of wound was Gradually Appeared. The wound is located on the Right,Medial Lower Leg. The wound measures 2cm length x 1.5cm width x 0.4cm depth; 2.356cm^2 area and 0.942cm^3 volume. The wound is limited to skin breakdown. There is no tunneling or undermining noted. There is a medium amount of serosanguineous drainage noted. The wound margin is flat and intact. There is small (1-33%) red, pink granulation within the wound bed. There is a medium (34-66%) amount of necrotic tissue within  the wound bed including Adherent Slough. The periwound skin appearance exhibited: Localized Edema, Moist. The periwound skin appearance did not exhibit: Callus, Crepitus, Excoriation, Fluctuance, Friable, Induration, Rash, Scarring, Dry/Scaly, Maceration, Atrophie Blanche, Cyanosis, Ecchymosis, Hemosiderin Staining, Mottled, Pallor, Rubor, Erythema. The periwound has tenderness on palpation. Wound #4 status is Open. Original cause of wound was Gradually Appeared. The wound is located on the Right,Distal Lower Leg. The wound measures 1.5cm length x 1.7cm width x 1cm depth; 2.003cm^2 area and 2.003cm^3 volume. The wound is limited to skin breakdown. There is no tunneling or undermining noted. There is a medium amount of serosanguineous drainage noted. The wound margin is flat and intact. There is small (1-33%) red, pink granulation within the wound bed. There is a medium (34-66%) amount of necrotic tissue within the wound bed including Adherent Slough. The periwound skin appearance exhibited: Localized Edema, Moist. The periwound skin appearance did not exhibit: Callus, Crepitus, Excoriation, Fluctuance, Friable, Induration, Rash, Scarring, Dry/Scaly, Maceration, Atrophie Blanche, Cyanosis, Ecchymosis, Hemosiderin Staining, Mottled, Pallor, Rubor, Erythema. Periwound temperature was noted as No Abnormality. The periwound has tenderness on palpation. Assessment NICHOL, EMMEL ([id number]) Active Problems ICD-10 E11.622 - Type 2 diabetes mellitus with other skin ulcer E66.01 - Morbid (severe) obesity due to excess calories L02.415 - Cutaneous abscess of right lower limb L97.222 - Non-pressure chronic ulcer of left calf with fat layer exposed I89.0 - Lymphedema, not elsewhere classified I50.40 - Unspecified combined systolic (congestive) and diastolic (congestive) heart failure F17.218 - Nicotine dependence, cigarettes, with other nicotine-induced disorders Procedures Wound #2 Wound #2 is a  Venous Leg Ulcer located on the Right,Lateral,Superior Lower Leg . There was a Skin/Subcutaneous Tissue Debridement [id number] debridement with total area of 1 sq cm performed by Judene Companion, MD. with the following instrument(s): Curette to remove Non-Viable tissue/material including Fibrin/Slough and Subcutaneous after achieving pain control using Lidocaine 4% Topical Solution. A time out was conducted prior to the start of the procedure. A Moderate amount of bleeding was controlled with Pressure. The procedure was tolerated well with a pain level of 0 throughout and a pain level of 0 following the procedure. Post Debridement Measurements: 1cm length x 1cm width x 0.5cm depth; 0.393cm^3 volume. Post procedure Diagnosis Wound #2: Same as Pre-Procedure Wound #3 Wound #3 is a Venous Leg Ulcer located on the Right,Medial Lower Leg . There was a Skin/Subcutaneous Tissue Debridement [id number]) debridement with total area of 3 sq cm performed by Judene Companion, MD. with the following instrument(s): Curette to remove Non-Viable tissue/material including Fibrin/Slough and Subcutaneous after achieving pain control using Lidocaine 4% Topical Solution. A time out was conducted prior to the start of the procedure. A Moderate amount of bleeding was controlled with Pressure. The procedure was tolerated well with a pain level of 0 throughout and a pain level of 0 following the procedure. Post Debridement Measurements: 2cm length x 1.5cm width x 0.4cm depth; 0.942cm^3 volume. Post procedure Diagnosis Wound #3: Same as Pre-Procedure Wound #4 Wound #4  is a Venous Leg Ulcer located on the Right,Distal Lower Leg . There was a Skin/Subcutaneous Tissue Debridement [id number]) debridement with total area of 2.55 sq cm performed by Judene Companion, MD. with the following instrument(s): Curette to remove Non-Viable tissue/material including Fibrin/Slough and Subcutaneous after achieving pain control using  Lidocaine 4% Topical Solution. A time out was conducted prior to the start of the procedure. A Moderate amount of bleeding was controlled with Pressure. The procedure was tolerated well with a pain level of 0 throughout and a pain level of 0 following the procedure. Post Debridement Measurements: 1.5cm length x 1.6cm width x 1cm depth; 1.885cm^3 DECKLYN, HAINS. ([id number]) volume. Post procedure Diagnosis Wound #4: Same as Pre-Procedure Plan Wound Cleansing: Wound #2 Right,Lateral,Superior Lower Leg: Cleanse wound with mild soap and water May Shower, gently pat wound dry prior to applying new dressing. Wound #3 Right,Medial Lower Leg: Cleanse wound with mild soap and water May Shower, gently pat wound dry prior to applying new dressing. Wound #4 Right,Distal Lower Leg: Cleanse wound with mild soap and water May Shower, gently pat wound dry prior to applying new dressing. Anesthetic: Wound #2 Right,Lateral,Superior Lower Leg: Topical Lidocaine 4% cream applied to wound bed prior to debridement Wound #3 Right,Medial Lower Leg: Topical Lidocaine 4% cream applied to wound bed prior to debridement Wound #4 Right,Distal Lower Leg: Topical Lidocaine 4% cream applied to wound bed prior to debridement Primary Wound Dressing: Wound #2 Right,Lateral,Superior Lower Leg: Aquacel Ag Wound #3 Right,Medial Lower Leg: Aquacel Ag Wound #4 Right,Distal Lower Leg: Aquacel Ag Secondary Dressing: Wound #2 Right,Lateral,Superior Lower Leg: Boardered Foam Dressing Wound #3 Right,Medial Lower Leg: Boardered Foam Dressing Wound #4 Right,Distal Lower Leg: Boardered Foam Dressing Dressing Change Frequency: Wound #2 Right,Lateral,Superior Lower Leg: Change dressing every other day. Wound #3 Right,Medial Lower Leg: Change dressing every other day. Wound #4 Right,Distal Lower Leg: Change dressing every other day. Follow-up Appointments: Wound #2 Right,Lateral,Superior Lower Leg: WESS, [place] ([id number]) Return Appointment in 1 week. Wound #3 Right,Medial Lower Leg: Return Appointment in 1 week. Wound #4 Right,Distal Lower Leg: Return Appointment in 1 week. Edema Control: Wound #2 Right,Lateral,Superior Lower Leg: Patient to wear own compression stockings Elevate legs to the level of the heart and pump ankles as often as possible Wound #3 Right,Medial Lower Leg: Patient to wear own compression stockings Elevate legs to the level of the heart and pump ankles as often as possible Wound #4 Right,Distal Lower Leg: Patient to wear own compression stockings Elevate legs to the level of the heart and pump ankles as often as possible 3 venous ulcers lateral left leg. Debrided and Rx daily with Santyl Electronic Signature(s) Signed: 01/22/2015 12:10:50 PM By: Judene Companion MD Entered By: Judene Companion on 01/22/2015 12:10:50 [name] ([id number]) -------------------------------------------------------------------------------- SuperBill Details Patient Name: [name] Date of Service: 01/22/2015 Medical Record Number: [id number] Patient Account Number: [id number] Date of Birth/Sex: 06/12/1965 (49 y.o. Male) Treating RN: Baruch Gouty, RN, BSN, [place] Primary Care Physician: [name], Rip Harbour Other Clinician: Referring Physician: [name], MELINDA Treating Physician/Extender: Benjaman Pott in Treatment: 3 Diagnosis Coding ICD-10 Codes Code Description E11.622 Type 2 diabetes mellitus with other skin ulcer E66.01 Morbid (severe) obesity due to excess calories L02.415 Cutaneous abscess of right lower limb L97.222 Non-pressure chronic ulcer of left calf with fat layer exposed I89.0 Lymphedema, not elsewhere classified I50.40 Unspecified combined systolic (congestive) and diastolic (congestive) heart failure F17.218 Nicotine dependence, cigarettes, with other nicotine-induced disorders Facility Procedures CPT4 Code: [id number] Description: [id number] -  DEB SUBQ TISSUE 20 SQ  CM/< ICD-10 Description Diagnosis E11.622 Type 2 diabetes mellitus with other skin ulcer Modifier: Quantity: 1 Physician Procedures CPT4 Code: [id number] Description: [id number] - WC PHYS LEVEL 2 - EST PT ICD-10 Description Diagnosis E66.01 Morbid (severe) obesity due to excess calories Modifier: Quantity: 1 CPT4 Code: [id number] Description: [id number] - WC PHYS SUBQ TISS 20 SQ CM ICD-10 Description Diagnosis E11.622 Type 2 diabetes mellitus with other skin ulcer Modifier: Quantity: 1 Electronic Signature(s) Signed: 01/22/2015 12:11:40 PM By: Judene Companion MD Entered By: Judene Companion on 01/22/2015 12:11:40

## 2015-01-29 ENCOUNTER — Encounter: Payer: Managed Care, Other (non HMO) | Attending: Surgery | Admitting: Surgery

## 2015-01-29 DIAGNOSIS — I83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: Secondary | ICD-10-CM | POA: Insufficient documentation

## 2015-01-29 DIAGNOSIS — I5042 Chronic combined systolic (congestive) and diastolic (congestive) heart failure: Secondary | ICD-10-CM | POA: Diagnosis not present

## 2015-01-29 DIAGNOSIS — F17200 Nicotine dependence, unspecified, uncomplicated: Secondary | ICD-10-CM | POA: Insufficient documentation

## 2015-01-29 DIAGNOSIS — L97222 Non-pressure chronic ulcer of left calf with fat layer exposed: Secondary | ICD-10-CM | POA: Insufficient documentation

## 2015-01-29 DIAGNOSIS — I890 Lymphedema, not elsewhere classified: Secondary | ICD-10-CM | POA: Insufficient documentation

## 2015-01-29 DIAGNOSIS — Z6841 Body Mass Index (BMI) 40.0 and over, adult: Secondary | ICD-10-CM | POA: Diagnosis not present

## 2015-01-29 DIAGNOSIS — E11622 Type 2 diabetes mellitus with other skin ulcer: Secondary | ICD-10-CM | POA: Insufficient documentation

## 2015-01-29 DIAGNOSIS — L02415 Cutaneous abscess of right lower limb: Secondary | ICD-10-CM | POA: Insufficient documentation

## 2015-01-30 NOTE — Progress Notes (Signed)
ARMAAN, RICHEL ([id number]) Visit Report for 01/29/2015 Chief Complaint Document Details Patient Name: [name], [name]. Date of Service: 01/29/2015 8:00 AM Medical Record Number: [id number] Patient Account Number: [id number] Date of Birth/Sex: 09-02-1965 (50 y.o. Male) Treating RN: Primary Care Physician: [name], Rip Harbour Other Clinician: Referring Physician: [name], MELINDA Treating Physician/Extender: Frann Rider in Treatment: 4 Information Obtained from: Patient Chief Complaint Patients presents for treatment of an open diabetic ulcer over the right lower extremity with swelling of both legs for about 1 month. Electronic Signature(s) Signed: 01/29/2015 8:39:37 AM By: Christin Fudge MD, FACS Entered By: Christin Fudge on 01/29/2015 08:39:37 [name] ([id number]) -------------------------------------------------------------------------------- HPI Details Patient Name: [name] Date of Service: 01/29/2015 8:00 AM Medical Record Number: [id number] Patient Account Number: [id number] Date of Birth/Sex: 1965-12-14 (50 y.o. Male) Treating RN: Primary Care Physician: [name], [place] Other Clinician: Referring Physician: [name], MELINDA Treating Physician/Extender: Frann Rider in Treatment: 4 History of Present Illness Location: Started getting pustules and boils on his right lower extremity along with swelling and was treated for this Quality: Patient reports experiencing a dull pain to affected area(s). Severity: Patient states wound are getting worse. Duration: Patient has had the wound for < 4 weeks prior to presenting for treatment Timing: Pain in wound is Intermittent (comes and goes Context: The wound appeared gradually over time Modifying Factors: Other treatment(s) tried include: local care and antibiotics and some compression stockings. Associated Signs and Symptoms: Patient reports having difficulty standing for long periods. HPI Description:  50 year old gentleman who has type 2 diabetes mellitus and morbid obesity has been having cellulitis and abscess of the right lower leg for the last 3 weeks. He has had multiple antibiotics and an IandD and over the last 10 days has been on Septra DS and doxycycline and prior to that Keflex. the patient is a poor historian and does not give a definite timeline but says he's been diagnosed with diabetes mellitus for about 8-10 months and has been on insulin. He does not measure his blood sugars regularly nor does he see his PCP regularly. Also has congestive heart failure for which he sees a cardiologist at [facility] but this is going to change as the cardiologist as retired. He is a smoker and smokes several cigars a day and drinks alcohol occasionally. He has been morbidly obese for several years. 01/08/2015 -- the patient complains about having to use his 3 layer compression wrap and has multiple issues with this none of them being medically oriented. He also continues to smoke, has not yet seen his cardiologist or his PCP and in general has continued to not look after himself. 01/15/2015 -- the patient did not allow [place] to put his 3 layer compression wrap last week and has been using his own compression stockings. Continues to smoke. He did see his cardiologist and they have increased his dose of Lasix, asked him to repeat an echo, lose weight and be compliant with his diabetic control. 01/29/2015 -- he has given up smoking for about the last week. He is unable to keep his appointment on 01/30/2015 with the vascular surgeons and has rescheduled it for next week. Electronic Signature(s) Signed: 01/29/2015 8:40:10 AM By: Christin Fudge MD, FACS Entered By: Christin Fudge on 01/29/2015 08:40:10 [name] ([id number]) -------------------------------------------------------------------------------- Physical Exam Details Patient Name: [name] Date of Service: 01/29/2015 8:00  AM Medical Record Number: [id number] Patient Account Number: [id number] Date of Birth/Sex: 04-Apr-1965 (50 y.o. Male) Treating RN: Primary Care  Physician: [name], Rip Harbour Other Clinician: Referring Physician: [name], MELINDA Treating Physician/Extender: Frann Rider in Treatment: 4 Constitutional . Pulse regular. Respirations normal and unlabored. Afebrile. . Eyes Nonicteric. Reactive to light. Ears, Nose, Mouth, and Throat Lips, teeth, and gums WNL.[name] Kitchen Moist mucosa without lesions. Neck supple and nontender. No palpable supraclavicular or cervical adenopathy. Normal sized without goiter. Respiratory WNL. No retractions.. Cardiovascular Pedal Pulses WNL. the edema both lower extremities is better. Lymphatic No adneopathy. No adenopathy. No adenopathy. Musculoskeletal Adexa without tenderness or enlargement.. Digits and nails w/o clubbing, cyanosis, infection, petechiae, ischemia, or inflammatory conditions.. Integumentary (Hair, Skin) No suspicious lesions. No crepitus or fluctuance. No peri-wound warmth or erythema. No masses.[name] Kitchen Psychiatric Judgement and insight Intact.. No evidence of depression, anxiety, or agitation.. Notes the wounds continue to be deep and there is some slough at the base. There is better control of his edema overall and his legs are not weeping as much. Electronic Signature(s) Signed: 01/29/2015 8:40:53 AM By: Christin Fudge MD, FACS Entered By: Christin Fudge on 01/29/2015 08:40:53 [name] ([id number]) -------------------------------------------------------------------------------- Physician Orders Details Patient Name: [name] Date of Service: 01/29/2015 8:00 AM Medical Record Number: [id number] Patient Account Number: [id number] Date of Birth/Sex: 1965/08/30 (49 y.o. Male) Treating RN: Ahmed Prima Primary Care Physician: [name], [place] Other Clinician: Referring Physician: [name], MELINDA Treating Physician/Extender: Frann Rider in Treatment: 4 Verbal / Phone Orders: Yes ClinicianCarolyne Fiscal, Debi Read Back and Verified: Yes Diagnosis Coding Wound Cleansing Wound #2 Right,Lateral,Superior Lower Leg o Cleanse wound with mild soap and water o May Shower, gently pat wound dry prior to applying new dressing. Wound #3 Right,Medial Lower Leg o Cleanse wound with mild soap and water o May Shower, gently pat wound dry prior to applying new dressing. Wound #4 Right,Distal Lower Leg o Cleanse wound with mild soap and water o May Shower, gently pat wound dry prior to applying new dressing. Anesthetic Wound #2 Right,Lateral,Superior Lower Leg o Topical Lidocaine 4% cream applied to wound bed prior to debridement Wound #3 Right,Medial Lower Leg o Topical Lidocaine 4% cream applied to wound bed prior to debridement Wound #4 Right,Distal Lower Leg o Topical Lidocaine 4% cream applied to wound bed prior to debridement Primary Wound Dressing Wound #2 Right,Lateral,Superior Lower Leg o Aquacel Ag Wound #3 Right,Medial Lower Leg o Aquacel Ag Wound #4 Right,Distal Lower Leg o Aquacel Ag Secondary Dressing Wound #2 Right,Lateral,Superior Lower Leg o Boardered Foam Dressing [name], [name] ([id number]) Wound #3 Right,Medial Lower Leg o Boardered Foam Dressing Wound #4 Right,Distal Lower Leg o Boardered Foam Dressing Dressing Change Frequency Wound #2 Right,Lateral,Superior Lower Leg o Change dressing every other day. Wound #3 Right,Medial Lower Leg o Change dressing every other day. Wound #4 Right,Distal Lower Leg o Change dressing every other day. Follow-up Appointments Wound #2 Right,Lateral,Superior Lower Leg o Return Appointment in 1 week. Wound #3 Right,Medial Lower Leg o Return Appointment in 1 week. Wound #4 Right,Distal Lower Leg o Return Appointment in 1 week. Edema Control Wound #2 Right,Lateral,Superior Lower Leg o Patient to wear own  compression stockings o Elevate legs to the level of the heart and pump ankles as often as possible Wound #3 Right,Medial Lower Leg o Patient to wear own compression stockings o Elevate legs to the level of the heart and pump ankles as often as possible Wound #4 Right,Distal Lower Leg o Patient to wear own compression stockings o Elevate legs to the level of the heart and pump ankles as often as possible Electronic Signature(s)  Signed: 01/29/2015 4:44:22 PM By: Christin Fudge MD, FACS Signed: 01/30/2015 10:53:26 AM By: Alric Quan Entered By: Alric Quan on 01/29/2015 08:35:11 [name] ([id number]) -------------------------------------------------------------------------------- Problem List Details Patient Name: [name] Date of Service: 01/29/2015 8:00 AM Medical Record Number: [id number] Patient Account Number: [id number] Date of Birth/Sex: 1965/08/07 (50 y.o. Male) Treating RN: Primary Care Physician: [name], [place] Other Clinician: Referring Physician: [name], [place] Treating Physician/Extender: Frann Rider in Treatment: 4 Active Problems ICD-10 Encounter Code Description Active Date Diagnosis E11.622 Type 2 diabetes mellitus with other skin ulcer 01/01/2015 Yes E66.01 Morbid (severe) obesity due to excess calories 01/01/2015 Yes L02.415 Cutaneous abscess of right lower limb 01/01/2015 Yes L97.222 Non-pressure chronic ulcer of left calf with fat layer 01/01/2015 Yes exposed I89.0 Lymphedema, not elsewhere classified 01/01/2015 Yes I50.40 Unspecified combined systolic (congestive) and diastolic [id number] Yes (congestive) heart failure F17.218 Nicotine dependence, cigarettes, with other nicotine- 01/01/2015 Yes induced disorders Inactive Problems Resolved Problems Electronic Signature(s) Signed: 01/29/2015 8:39:31 AM By: Christin Fudge MD, FACS Entered By: Christin Fudge on 01/29/2015 08:39:31 [name]  ([id number]) -------------------------------------------------------------------------------- Progress Note Details Patient Name: [name] Date of Service: 01/29/2015 8:00 AM Medical Record Number: [id number] Patient Account Number: [id number] Date of Birth/Sex: 1965-12-18 (50 y.o. Male) Treating RN: Primary Care Physician: [name], Rip Harbour Other Clinician: Referring Physician: [name], MELINDA Treating Physician/Extender: Frann Rider in Treatment: 4 Subjective Chief Complaint Information obtained from Patient Patients presents for treatment of an open diabetic ulcer over the right lower extremity with swelling of both legs for about 1 month. History of Present Illness (HPI) The following HPI elements were documented for the patient's wound: Location: Started getting pustules and boils on his right lower extremity along with swelling and was treated for this Quality: Patient reports experiencing a dull pain to affected area(s). Severity: Patient states wound are getting worse. Duration: Patient has had the wound for < 4 weeks prior to presenting for treatment Timing: Pain in wound is Intermittent (comes and goes Context: The wound appeared gradually over time Modifying Factors: Other treatment(s) tried include: local care and antibiotics and some compression stockings. Associated Signs and Symptoms: Patient reports having difficulty standing for long periods. 50 year old gentleman who has type 2 diabetes mellitus and morbid obesity has been having cellulitis and abscess of the right lower leg for the last 3 weeks. He has had multiple antibiotics and an IandD and over the last 10 days has been on Septra DS and doxycycline and prior to that Keflex. the patient is a poor historian and does not give a definite timeline but says he's been diagnosed with diabetes mellitus for about 8-10 months and has been on insulin. He does not measure his blood sugars regularly nor does he see  his PCP regularly. Also has congestive heart failure for which he sees a cardiologist at [facility] but this is going to change as the cardiologist as retired. He is a smoker and smokes several cigars a day and drinks alcohol occasionally. He has been morbidly obese for several years. 01/08/2015 -- the patient complains about having to use his 3 layer compression wrap and has multiple issues with this none of them being medically oriented. He also continues to smoke, has not yet seen his cardiologist or his PCP and in general has continued to not look after himself. 01/15/2015 -- the patient did not allow [place] to put his 3 layer compression wrap last week and has been using his own compression stockings. Continues to smoke.  He did see his cardiologist and they have increased his dose of Lasix, asked him to repeat an echo, lose weight and be compliant with his diabetic control. 01/29/2015 -- he has given up smoking for about the last week. He is unable to keep his appointment on 01/30/2015 with the vascular surgeons and has rescheduled it for next week. [name], [name] ([id number]) Objective Constitutional Pulse regular. Respirations normal and unlabored. Afebrile. Vitals Time Taken: 8:10 AM, Height: 71 in, Weight: 360 lbs, BMI: 50.2, Temperature: 98.1 F, Pulse: 91 bpm, Respiratory Rate: 18 breaths/min, Blood Pressure: 169/100 mmHg. General Notes: pt states that he knew his Bp would high because he has not had his BP meds this morning and he is aggravated and hot. Eyes Nonicteric. Reactive to light. Ears, Nose, Mouth, and Throat Lips, teeth, and gums WNL.[name] Kitchen Moist mucosa without lesions. Neck supple and nontender. No palpable supraclavicular or cervical adenopathy. Normal sized without goiter. Respiratory WNL. No retractions.. Cardiovascular Pedal Pulses WNL. the edema both lower extremities is better. Lymphatic No adneopathy. No adenopathy. No adenopathy. Musculoskeletal Adexa  without tenderness or enlargement.. Digits and nails w/o clubbing, cyanosis, infection, petechiae, ischemia, or inflammatory conditions.[name] Kitchen Psychiatric Judgement and insight Intact.. No evidence of depression, anxiety, or agitation.. General Notes: the wounds continue to be deep and there is some slough at the base. There is better control of his edema overall and his legs are not weeping as much. Integumentary (Hair, Skin) No suspicious lesions. No crepitus or fluctuance. No peri-wound warmth or erythema. No masses.. Wound #2 status is Open. Original cause of wound was Gradually Appeared. The wound is located on the [place] ([id number]) Right,Lateral,Superior Lower Leg. The wound measures 1cm length x 0.8cm width x 1.4cm depth; 0.628cm^2 area and 0.88cm^3 volume. The wound is limited to skin breakdown. There is no tunneling or undermining noted. There is a large amount of purulent drainage noted. The wound margin is flat and intact. There is medium (34-66%) pink granulation within the wound bed. There is a medium (34-66%) amount of necrotic tissue within the wound bed including Adherent Slough. The periwound skin appearance exhibited: Localized Edema, Moist. Periwound temperature was noted as No Abnormality. The periwound has tenderness on palpation. Wound #3 status is Open. Original cause of wound was Gradually Appeared. The wound is located on the Right,Medial Lower Leg. The wound measures 1.6cm length x 1cm width x 1.2cm depth; 1.257cm^2 area and 1.508cm^3 volume. The wound is limited to skin breakdown. There is no tunneling or undermining noted. There is a large amount of serous drainage noted. The wound margin is flat and intact. There is medium (34-66%) red, pink granulation within the wound bed. There is a medium (34-66%) amount of necrotic tissue within the wound bed including Adherent Slough. The periwound skin appearance exhibited: Localized Edema, Moist. The periwound skin  appearance did not exhibit: Callus, Crepitus, Excoriation, Fluctuance, Friable, Induration, Rash, Scarring, Dry/Scaly, Maceration, Atrophie Blanche, Cyanosis, Ecchymosis, Hemosiderin Staining, Mottled, Pallor, Rubor, Erythema. The periwound has tenderness on palpation. Wound #4 status is Open. Original cause of wound was Gradually Appeared. The wound is located on the Right,Distal Lower Leg. The wound measures 1.5cm length x 1.4cm width x 1cm depth; 1.649cm^2 area and 1.649cm^3 volume. The wound is limited to skin breakdown. There is no tunneling or undermining noted. There is a large amount of serous drainage noted. The wound margin is flat and intact. There is medium (34-66%) red, pink granulation within the wound bed. There is a medium (34-66%) amount  of necrotic tissue within the wound bed including Adherent Slough. The periwound skin appearance exhibited: Localized Edema, Moist. The periwound skin appearance did not exhibit: Callus, Crepitus, Excoriation, Fluctuance, Friable, Induration, Rash, Scarring, Dry/Scaly, Maceration, Atrophie Blanche, Cyanosis, Ecchymosis, Hemosiderin Staining, Mottled, Pallor, Rubor, Erythema. Periwound temperature was noted as No Abnormality. The periwound has tenderness on palpation. Assessment Active Problems ICD-10 E11.622 - Type 2 diabetes mellitus with other skin ulcer E66.01 - Morbid (severe) obesity due to excess calories L02.415 - Cutaneous abscess of right lower limb L97.222 - Non-pressure chronic ulcer of left calf with fat layer exposed I89.0 - Lymphedema, not elsewhere classified I50.40 - Unspecified combined systolic (congestive) and diastolic (congestive) heart failure F17.218 - Nicotine dependence, cigarettes, with other nicotine-induced disorders [name], [name] ([id number]) Plan Wound Cleansing: Wound #2 Right,Lateral,Superior Lower Leg: Cleanse wound with mild soap and water May Shower, gently pat wound dry prior to applying new  dressing. Wound #3 Right,Medial Lower Leg: Cleanse wound with mild soap and water May Shower, gently pat wound dry prior to applying new dressing. Wound #4 Right,Distal Lower Leg: Cleanse wound with mild soap and water May Shower, gently pat wound dry prior to applying new dressing. Anesthetic: Wound #2 Right,Lateral,Superior Lower Leg: Topical Lidocaine 4% cream applied to wound bed prior to debridement Wound #3 Right,Medial Lower Leg: Topical Lidocaine 4% cream applied to wound bed prior to debridement Wound #4 Right,Distal Lower Leg: Topical Lidocaine 4% cream applied to wound bed prior to debridement Primary Wound Dressing: Wound #2 Right,Lateral,Superior Lower Leg: Aquacel Ag Wound #3 Right,Medial Lower Leg: Aquacel Ag Wound #4 Right,Distal Lower Leg: Aquacel Ag Secondary Dressing: Wound #2 Right,Lateral,Superior Lower Leg: Boardered Foam Dressing Wound #3 Right,Medial Lower Leg: Boardered Foam Dressing Wound #4 Right,Distal Lower Leg: Boardered Foam Dressing Dressing Change Frequency: Wound #2 Right,Lateral,Superior Lower Leg: Change dressing every other day. Wound #3 Right,Medial Lower Leg: Change dressing every other day. Wound #4 Right,Distal Lower Leg: Change dressing every other day. Follow-up Appointments: Wound #2 Right,Lateral,Superior Lower Leg: Return Appointment in 1 week. Wound #3 Right,Medial Lower Leg: Return Appointment in 1 week. Wound #4 Right,Distal Lower Leg: Return Appointment in 1 week. Edema Control: Wound #2 Right,Lateral,Superior Lower Leg: Patient to wear own compression stockings [name], [name]. ([id number]) Elevate legs to the level of the heart and pump ankles as often as possible Wound #3 Right,Medial Lower Leg: Patient to wear own compression stockings Elevate legs to the level of the heart and pump ankles as often as possible Wound #4 Right,Distal Lower Leg: Patient to wear own compression stockings Elevate legs to the level  of the heart and pump ankles as often as possible I have recommended: 1. Aquacel Ag over the wounds with a 3 layer compression to the right lower extremity and for him to continue wearing his compression stockings on the left lower extremity. The patient says he is got too many psychosocial issues and may not be able to tolerate this. I have put it on record that he has not been compliant with his compression and he refuses to wear what we prescribed. 2. Proper control of his diabetes by seeing his PCP as soon as possible and getting his blood sugars checked regularly which she has not been doing 3. he has been seen by his cardiologist was increase his dose of furosemide and told him to lose weight and do echo. 4. I have commended him on giving up smoking. 5. Venous duplex studies of both lower extremities has been rescheduled. 6. Regular visits  to the wound care center and taking ownership of looking after his health and being compliant. 7. I have also told him to call the ER immediately if he develops signs and symptoms of a cellulitis Electronic Signature(s) Signed: 01/29/2015 8:52:59 AM By: Christin Fudge MD, FACS Previous Signature: 01/29/2015 8:42:07 AM Version By: Christin Fudge MD, FACS Entered By: Christin Fudge on 01/29/2015 08:52:59 [name] ([id number]) -------------------------------------------------------------------------------- SuperBill Details Patient Name: [name] Date of Service: 01/29/2015 Medical Record Number: [id number] Patient Account Number: [id number] Date of Birth/Sex: Jul 02, 1965 (50 y.o. Male) Treating RN: Primary Care Physician: [name], Rip Harbour Other Clinician: Referring Physician: [name], MELINDA Treating Physician/Extender: Frann Rider in Treatment: 4 Diagnosis Coding ICD-10 Codes Code Description E11.622 Type 2 diabetes mellitus with other skin ulcer E66.01 Morbid (severe) obesity due to excess calories L02.415 Cutaneous abscess of  right lower limb L97.222 Non-pressure chronic ulcer of left calf with fat layer exposed I89.0 Lymphedema, not elsewhere classified I50.40 Unspecified combined systolic (congestive) and diastolic (congestive) heart failure F17.218 Nicotine dependence, cigarettes, with other nicotine-induced disorders Facility Procedures CPT4 Code: [id number] Description: 99214 - WOUND CARE VISIT-LEV 4 EST PT Modifier: Quantity: 1 Physician Procedures CPT4 Code: [id number] Description: 99213 - WC PHYS LEVEL 3 - EST PT ICD-10 Description Diagnosis E11.622 Type 2 diabetes mellitus with other skin ulcer L97.222 Non-pressure chronic ulcer of left calf with fat I89.0 Lymphedema, not elsewhere classified E66.01 Morbid (severe)  obesity due to excess calories Modifier: layer exposed Quantity: 1 Electronic Signature(s) Signed: 01/29/2015 8:53:18 AM By: Christin Fudge MD, FACS Entered By: Christin Fudge on 01/29/2015 08:53:18

## 2015-01-30 NOTE — Progress Notes (Signed)
TRENTAN, TRIPPE ([id number]) Visit Report for 01/29/2015 Arrival Information Details Patient Name: [name], [name]. Date of Service: 01/29/2015 8:00 AM Medical Record Number: [id number] Patient Account Number: [id number] Date of Birth/Sex: Sep 06, 1965 (49 y.o. Male) Treating RN: Ahmed Prima Primary Care Physician: [name], [place] Other Clinician: Referring Physician: [name], [place] Treating Physician/Extender: Frann Rider in Treatment: 4 Visit Information History Since Last Visit All ordered tests and consults were completed: No Patient Arrived: Ambulatory Added or deleted any medications: No Arrival Time: 08:10 Any new allergies or adverse reactions: No Accompanied By: self Had a fall or experienced change in No Transfer Assistance: None activities of daily living that may affect Patient Identification Verified: Yes risk of falls: Secondary Verification Process Yes Signs or symptoms of abuse/neglect since last No Completed: visito Patient Requires Transmission-Based No Hospitalized since last visit: No Precautions: Pain Present Now: No Patient Has Alerts: Yes Patient Alerts: DM II Electronic Signature(s) Signed: 01/30/2015 10:53:26 AM By: Alric Quan Entered By: Alric Quan on 01/29/2015 08:10:21 [name] ([id number]) -------------------------------------------------------------------------------- Clinic Level of Care Assessment Details Patient Name: [name] Date of Service: 01/29/2015 8:00 AM Medical Record Number: [id number] Patient Account Number: [id number] Date of Birth/Sex: [birth year] (50 y.o. Male) Treating RN: Ahmed Prima Primary Care Physician: [name], [place] Other Clinician: Referring Physician: [name], MELINDA Treating Physician/Extender: Frann Rider in Treatment: 4 Clinic Level of Care Assessment Items TOOL 4 Quantity Score [] - Use when only an EandM is performed on FOLLOW-UP visit 0 ASSESSMENTS - Nursing  Assessment / Reassessment [] - Reassessment of Co-morbidities (includes updates in patient status) 0 X - Reassessment of Adherence to Treatment Plan 1 5 ASSESSMENTS - Wound and Skin Assessment / Reassessment [] - Simple Wound Assessment / Reassessment - one wound 0 X - Complex Wound Assessment / Reassessment - multiple wounds 1 5 [] - Dermatologic / Skin Assessment (not related to wound area) 0 ASSESSMENTS - Focused Assessment [] - Circumferential Edema Measurements - multi extremities 0 [] - Nutritional Assessment / Counseling / Intervention 0 [] - Lower Extremity Assessment (monofilament, tuning fork, pulses) 0 [] - Peripheral Arterial Disease Assessment (using hand held doppler) 0 ASSESSMENTS - Ostomy and/or Continence Assessment and Care [] - Incontinence Assessment and Management 0 [] - Ostomy Care Assessment and Management (repouching, etc.) 0 PROCESS - Coordination of Care [] - Simple Patient / Family Education for ongoing care 0 X - Complex (extensive) Patient / Family Education for ongoing care 1 20 [] - Staff obtains Programmer, systems, Records, Test Results / Process Orders 0 [] - Staff telephones HHA, Nursing Homes / Clarify orders / etc 0 [] - Routine Transfer to another Facility (non-emergent condition) 0 [name], [name] ([id number]) [] - Routine Hospital Admission (non-emergent condition) 0 [] - New Admissions / Biomedical engineer / Ordering NPWT, Apligraf, etc. 0 [] - Emergency Hospital Admission (emergent condition) 0 X - Simple Discharge Coordination 1 10 [] - Complex (extensive) Discharge Coordination 0 PROCESS - Special Needs [] - Pediatric / Minor Patient Management 0 [] - Isolation Patient Management 0 [] - Hearing / Language / Visual special needs 0 [] - Assessment of Community assistance (transportation, D/C planning, etc.) 0 [] - Additional assistance / Altered mentation 0 [] - Support Surface(s) Assessment (bed, cushion, seat, etc.) 0 INTERVENTIONS - Wound  Cleansing / Measurement [] - Simple Wound Cleansing - one wound 0 X - Complex Wound Cleansing - multiple wounds 3 5 X - Wound Imaging (photographs - any number of wounds) 1  5 [] - Wound Tracing (instead of photographs) 0 [] - Simple Wound Measurement - one wound 0 X - Complex Wound Measurement - multiple wounds 3 5 INTERVENTIONS - Wound Dressings [] - Small Wound Dressing one or multiple wounds 0 X - Medium Wound Dressing one or multiple wounds 3 15 [] - Large Wound Dressing one or multiple wounds 0 [] - Application of Medications - topical 0 [] - Application of Medications - injection 0 INTERVENTIONS - Miscellaneous [] - External ear exam 0 [name], [name] ([id number]) [] - Specimen Collection (cultures, biopsies, blood, body fluids, etc.) 0 [] - Specimen(s) / Culture(s) sent or taken to Lab for analysis 0 [] - Patient Transfer (multiple staff / Harrel Lemon Lift / Similar devices) 0 [] - Simple Staple / Suture removal (25 or less) 0 [] - Complex Staple / Suture removal (26 or more) 0 [] - Hypo / Hyperglycemic Management (close monitor of Blood Glucose) 0 [] - Ankle / Brachial Index (ABI) - do not check if billed separately 0 X - Vital Signs 1 5 Has the patient been seen at the hospital within the last three years: Yes Total Score: 125 Level Of Care: New/Established - Level 4 Electronic Signature(s) Signed: 01/30/2015 10:53:26 AM By: Alric Quan Entered By: Alric Quan on 01/29/2015 08:37:00 [name] ([id number]) -------------------------------------------------------------------------------- Encounter Discharge Information Details Patient Name: [name] Date of Service: 01/29/2015 8:00 AM Medical Record Number: [id number] Patient Account Number: [id number] Date of Birth/Sex: [birth year] (49 y.o. Male) Treating RN: Ahmed Prima Primary Care Physician: [name], [place] Other Clinician: Referring Physician: [name], MELINDA Treating Physician/Extender: Frann Rider in Treatment: 4 Encounter Discharge Information Items Discharge Pain Level: 0 Discharge Condition: Stable Ambulatory Status: Ambulatory Discharge Destination: Home Transportation: Private Auto Accompanied By: self Schedule Follow-up Appointment: Yes Medication Reconciliation completed and provided to Patient/Care Yes : Provided on Clinical Summary of Care: 01/29/2015 Form Type Recipient Paper Patient RT Electronic Signature(s) Signed: 01/29/2015 8:49:28 AM By: Sharon Mt Entered By: Sharon Mt on 01/29/2015 08:49:28 [name] ([id number]) -------------------------------------------------------------------------------- Lower Extremity Assessment Details Patient Name: [name] Date of Service: 01/29/2015 8:00 AM Medical Record Number: [id number] Patient Account Number: [id number] Date of Birth/Sex: Aug 29, 1965 (49 y.o. Male) Treating RN: Carolyne Fiscal, Debi Primary Care Physician: [name], [place] Other Clinician: Referring Physician: [name], MELINDA Treating Physician/Extender: Frann Rider in Treatment: 4 Edema Assessment Assessed: [Left: No] [Right: No] Edema: [Left: Ye] [Right: s] Calf Left: Right: Point of Measurement: cm From Medial Instep cm 49.3 cm Ankle Left: Right: Point of Measurement: cm From Medial Instep cm 29.9 cm Vascular Assessment Pulses: Posterior Tibial Dorsalis Pedis Palpable: [Right:Yes] Extremity colors, hair growth, and conditions: Extremity Color: [Right:Hyperpigmented] Hair Growth on Extremity: [Right:No] Temperature of Extremity: [Right:Warm] Capillary Refill: [Right:< 3 seconds] Toe Nail Assessment Left: Right: Thick: No Discolored: No Deformed: No Improper Length and Hygiene: No Electronic Signature(s) Signed: 01/30/2015 10:53:26 AM By: Alric Quan Entered By: Alric Quan on 01/29/2015 08:17:48 [name]  ([id number]) -------------------------------------------------------------------------------- Multi Wound Chart Details Patient Name: [name] Date of Service: 01/29/2015 8:00 AM Medical Record Number: [id number] Patient Account Number: [id number] Date of Birth/Sex: [birth year] (50 y.o. Male) Treating RN: Ahmed Prima Primary Care Physician: [name], [place] Other Clinician: Referring Physician: [name], MELINDA Treating Physician/Extender: Frann Rider in Treatment: 4 Vital Signs Height(in): 71 Pulse(bpm): 91 Weight(lbs): 360 Blood Pressure 169/100 (mmHg): Body Mass Index(BMI): 50 Temperature(F): 98.1 Respiratory Rate 18 (breaths/min): Photos: [2:No Photos] [3:No Photos] [4:No Photos] Wound Location: [2:Right Lower  Leg - Lateral, Right Lower Leg - Medial Right Lower Leg - Distal Superior] Wounding Event: [2:Gradually Appeared] [3:Gradually Appeared] [4:Gradually Appeared] Primary Etiology: [2:Venous Leg Ulcer] [3:Venous Leg Ulcer] [4:Venous Leg Ulcer] Comorbid History: [2:Lymphedema, Congestive Lymphedema, Congestive Lymphedema, Congestive Heart Failure, Hypertension, Type II Diabetes, Gout] [3:Heart Failure, Hypertension, Type II Diabetes, Gout] [4:Heart Failure, Hypertension, Type II Diabetes, Gout] Date Acquired: [2:12/13/2014] [3:12/13/2014] [4:12/13/2014] Weeks of Treatment: [2:4] [3:4] [4:4] Wound Status: [2:Open] [3:Open] [4:Open] Measurements L x W x D 1x0.8x1.4 [3:1.6x1x1.2] [4:1.5x1.4x1] (cm) Area (cm) : [2:0.628] [3:1.257] [4:1.649] Volume (cm) : [2:0.88] [3:1.508] [4:1.649] % Reduction in Area: [2:20.00%] [3:33.30%] [4:-7.60%] % Reduction in Volume: -460.50% [3:-300.00%] [4:-438.90%] Classification: [2:Full Thickness Without Exposed Support Structures] [3:Full Thickness Without Exposed Support Structures] [4:Full Thickness Without Exposed Support Structures] HBO Classification: [2:Grade 1] [3:Grade 1] [4:Grade 1] Exudate Amount: [2:Large] [3:Large]  [4:Large] Exudate Type: [2:Purulent] [3:Serous] [4:Serous] Exudate Color: [2:yellow, brown, green] [3:amber] [4:amber] Wound Margin: [2:Flat and Intact] [3:Flat and Intact] [4:Flat and Intact] Granulation Amount: [2:Medium (34-66%)] [3:Medium (34-66%)] [4:Medium (34-66%)] Granulation Quality: [2:Pink] [3:Red, Pink] [4:Red, Pink] Necrotic Amount: [2:Medium (34-66%)] [3:Medium (34-66%)] [4:Medium (34-66%)] Exposed Structures: [name], [name] ([id number]) Fascia: No Fascia: No Fascia: No Fat: No Fat: No Fat: No Tendon: No Tendon: No Tendon: No Muscle: No Muscle: No Muscle: No Joint: No Joint: No Joint: No Bone: No Bone: No Bone: No Limited to Skin Limited to Skin Limited to Skin Breakdown Breakdown Breakdown Epithelialization: None None None Periwound Skin Texture: Edema: Yes Edema: Yes Edema: Yes Excoriation: No Excoriation: No Induration: No Induration: No Callus: No Callus: No Crepitus: No Crepitus: No Fluctuance: No Fluctuance: No Friable: No Friable: No Rash: No Rash: No Scarring: No Scarring: No Periwound Skin Moist: Yes Moist: Yes Moist: Yes Moisture: Maceration: No Maceration: No Dry/Scaly: No Dry/Scaly: No Periwound Skin Color: No Abnormalities Noted Atrophie Blanche: No Atrophie Blanche: No Cyanosis: No Cyanosis: No Ecchymosis: No Ecchymosis: No Erythema: No Erythema: No Hemosiderin Staining: No Hemosiderin Staining: No Mottled: No Mottled: No Pallor: No Pallor: No Rubor: No Rubor: No Temperature: No Abnormality N/A No Abnormality Tenderness on Yes Yes Yes Palpation: Wound Preparation: Ulcer Cleansing: Other: Ulcer Cleansing: Other: Ulcer Cleansing: Other: soap and water soap and water soap and water Topical Anesthetic Topical Anesthetic Topical Anesthetic Applied: Other: lidocaine Applied: Other: lidocaine Applied: Other: lidocaine 4% 4% 4% Treatment Notes Electronic Signature(s) Signed: 01/30/2015 10:53:26 AM By: Alric Quan Entered By: Alric Quan on 01/29/2015 08:33:39 [name] ([id number]) -------------------------------------------------------------------------------- [place] Details Patient Name: [name] Date of Service: 01/29/2015 8:00 AM Medical Record Number: [id number] Patient Account Number: [id number] Date of Birth/Sex: 1965-07-10 (49 y.o. Male) Treating RN: Ahmed Prima Primary Care Physician: [name], [place] Other Clinician: Referring Physician: [name], MELINDA Treating Physician/Extender: Frann Rider in Treatment: 4 Active Inactive Abuse / Safety / Falls / Self Care Management Nursing Diagnoses: Potential for falls Goals: Patient will remain injury free Date Initiated: 01/01/2015 Goal Status: Active Patient/caregiver will verbalize/demonstrate measures taken to prevent injury and/or falls Date Initiated: 01/01/2015 Goal Status: Active Interventions: Assess fall risk on admission and as needed Notes: Nutrition Nursing Diagnoses: Imbalanced nutrition Goals: Patient/caregiver agrees to and verbalizes understanding of need to use nutritional supplements and/or vitamins as prescribed Date Initiated: 01/01/2015 Goal Status: Active Patient/caregiver verbalizes understanding of need to maintain therapeutic glucose control per primary care physician Date Initiated: 01/01/2015 Goal Status: Active Patient/caregiver will maintain therapeutic glucose control Date Initiated: 01/01/2015 Goal Status: Active Interventions: [name], [name] ([id number]) Provide education on  elevated blood sugars and impact on wound healing Provide education on nutrition Treatment Activities: Education provided on Nutrition : 01/08/2015 Notes: Orientation to the Wound Care Program Nursing Diagnoses: Knowledge deficit related to the wound healing center program Goals: Patient/caregiver will verbalize understanding of the [place]  Program Date Initiated: 01/01/2015 Goal Status: Active Interventions: Provide education on orientation to the wound center Notes: Pain, Acute or Chronic Nursing Diagnoses: Pain, acute or chronic: actual or potential Goals: Patient will verbalize adequate pain control and receive pain control interventions during procedures as needed Date Initiated: 01/01/2015 Goal Status: Active Patient/caregiver will verbalize adequate pain control between visits Date Initiated: 01/01/2015 Goal Status: Active Patient/caregiver will verbalize comfort level met Date Initiated: 01/01/2015 Goal Status: Active Interventions: Assess comfort goal upon admission Complete pain assessment as per visit requirements Notes: Wound/Skin Impairment [name], [name] ([id number]) Nursing Diagnoses: Impaired tissue integrity Knowledge deficit related to smoking impact on wound healing Knowledge deficit related to ulceration/compromised skin integrity Goals: Patient will demonstrate a reduced rate of smoking or cessation of smoking Date Initiated: 01/01/2015 Goal Status: Active Patient/caregiver will verbalize understanding of skin care regimen Date Initiated: 01/01/2015 Goal Status: Active Ulcer/skin breakdown will have a volume reduction of 30% by week 4 Date Initiated: 01/01/2015 Goal Status: Active Ulcer/skin breakdown will have a volume reduction of 50% by week 8 Date Initiated: 01/01/2015 Goal Status: Active Ulcer/skin breakdown will have a volume reduction of 80% by week 12 Date Initiated: 01/01/2015 Goal Status: Active Interventions: Assess patient/caregiver ability to obtain necessary supplies Assess patient/caregiver ability to perform ulcer/skin care regimen upon admission and as needed Notes: Electronic Signature(s) Signed: 01/30/2015 10:53:26 AM By: Alric Quan Entered By: Alric Quan on 01/29/2015 08:33:33 [name]  ([id number]) -------------------------------------------------------------------------------- Pain Assessment Details Patient Name: [name] Date of Service: 01/29/2015 8:00 AM Medical Record Number: [id number] Patient Account Number: [id number] Date of Birth/Sex: 01/05/1966 (50 y.o. Male) Treating RN: Ahmed Prima Primary Care Physician: [name], [place] Other Clinician: Referring Physician: [name], [place] Treating Physician/Extender: Frann Rider in Treatment: 4 Active Problems Location of Pain Severity and Description of Pain Patient Has Paino No Site Locations Pain Management and Medication Current Pain Management: Electronic Signature(s) Signed: 01/30/2015 10:53:26 AM By: Alric Quan Entered By: Alric Quan on 01/29/2015 08:10:28 [name] ([id number]) -------------------------------------------------------------------------------- Patient/Caregiver Education Details Patient Name: [name] Date of Service: 01/29/2015 8:00 AM Medical Record Number: [id number] Patient Account Number: [id number] Date of Birth/Gender: 08-04-1965 (50 y.o. Male) Treating RN: Ahmed Prima Primary Care Physician: [name], [place] Other Clinician: Referring Physician: [name], [place] Treating Physician/Extender: Frann Rider in Treatment: 4 Education Assessment Education Provided To: Patient Education Topics Provided Wound/Skin Impairment: Handouts: Other: change dressing as directed Methods: Demonstration, Explain/Verbal Responses: State content correctly Electronic Signature(s) Signed: 01/30/2015 10:53:26 AM By: Alric Quan Entered By: Alric Quan on 01/29/2015 08:48:29 [name] ([id number]) -------------------------------------------------------------------------------- Wound Assessment Details Patient Name: [name] Date of Service: 01/29/2015 8:00 AM Medical Record Number: [id number] Patient Account Number:  [id number] Date of Birth/Sex: 02/09/1965 (50 y.o. Male) Treating RN: Ahmed Prima Primary Care Physician: [name], [place] Other Clinician: Referring Physician: [name], MELINDA Treating Physician/Extender: Frann Rider in Treatment: 4 Wound Status Wound Number: 2 Primary Venous Leg Ulcer Etiology: Wound Location: Right Lower Leg - Lateral, Superior Wound Open Status: Wounding Event: Gradually Appeared Comorbid Lymphedema, Congestive Heart Date Acquired: 12/13/2014 History: Failure, Hypertension, Type II Weeks Of Treatment: 4 Diabetes, Gout Clustered Wound: No Photos Photo Uploaded By: Alric Quan on 01/30/2015 15:19:18  Wound Measurements Length: (cm) 1 Width: (cm) 0.8 Depth: (cm) 1.4 Area: (cm) 0.628 Volume: (cm) 0.88 % Reduction in Area: 20% % Reduction in Volume: -460.5% Epithelialization: None Tunneling: No Undermining: No Wound Description Full Thickness Without Classification: Exposed Support Structures Diabetic Severity Grade 1 (Wagner): Wound Margin: Flat and Intact Exudate Amount: Large Exudate Type: Purulent Exudate Color: yellow, brown, green Foul Odor After Cleansing: No Wound Bed Granulation Amount: Medium (34-66%) Exposed Structure [name], [name] ([id number]) Granulation Quality: Pink Fascia Exposed: No Necrotic Amount: Medium (34-66%) Fat Layer Exposed: No Necrotic Quality: Adherent Slough Tendon Exposed: No Muscle Exposed: No Joint Exposed: No Bone Exposed: No Limited to Skin Breakdown Periwound Skin Texture Texture Color No Abnormalities Noted: No No Abnormalities Noted: No Localized Edema: Yes Temperature / Pain Moisture Temperature: No Abnormality No Abnormalities Noted: No Tenderness on Palpation: Yes Moist: Yes Wound Preparation Ulcer Cleansing: Other: soap and water, Topical Anesthetic Applied: Other: lidocaine 4%, Treatment Notes Wound #2 (Right, Lateral, Superior Lower Leg) 1. Cleansed with: Clean wound  with Normal Saline 2. Anesthetic Topical Lidocaine 4% cream to wound bed prior to debridement 3. Peri-wound Care: Skin Prep 4. Dressing Applied: Aquacel Ag 5. Secondary Dressing Applied Bordered Foam Dressing Electronic Signature(s) Signed: 01/30/2015 10:53:26 AM By: Alric Quan Entered By: Alric Quan on 01/29/2015 08:21:52 [name] ([id number]) -------------------------------------------------------------------------------- Wound Assessment Details Patient Name: [name] Date of Service: 01/29/2015 8:00 AM Medical Record Number: [id number] Patient Account Number: [id number] Date of Birth/Sex: 1965-09-28 (49 y.o. Male) Treating RN: Ahmed Prima Primary Care Physician: [name], [place] Other Clinician: Referring Physician: [name], [place] Treating Physician/Extender: Frann Rider in Treatment: 4 Wound Status Wound Number: 3 Primary Venous Leg Ulcer Etiology: Wound Location: Right Lower Leg - Medial Wound Open Wounding Event: Gradually Appeared Status: Date Acquired: 12/13/2014 Comorbid Lymphedema, Congestive Heart Weeks Of Treatment: 4 History: Failure, Hypertension, Type II Clustered Wound: No Diabetes, Gout Photos Photo Uploaded By: Alric Quan on 01/30/2015 15:19:54 Wound Measurements Length: (cm) 1.6 % Reduction i Width: (cm) 1 % Reduction i Depth: (cm) 1.2 Epithelializa Area: (cm) 1.257 Tunneling: Volume: (cm) 1.508 Undermining: n Area: 33.3% n Volume: -300% tion: None No No Wound Description Full Thickness Without Classification: Exposed Support Structures Diabetic Severity Grade 1 (Wagner): Wound Margin: Flat and Intact Exudate Amount: Large Exudate Type: Serous Exudate Color: amber Foul Odor After Cleansing: No Wound Bed Granulation Amount: Medium (34-66%) Exposed Structure [name], [name]. ([id number]) Granulation Quality: Red, Pink Fascia Exposed: No Necrotic Amount: Medium (34-66%) Fat Layer  Exposed: No Necrotic Quality: Adherent Slough Tendon Exposed: No Muscle Exposed: No Joint Exposed: No Bone Exposed: No Limited to Skin Breakdown Periwound Skin Texture Texture Color No Abnormalities Noted: No No Abnormalities Noted: No Callus: No Atrophie Blanche: No Crepitus: No Cyanosis: No Excoriation: No Ecchymosis: No Fluctuance: No Erythema: No Friable: No Hemosiderin Staining: No Induration: No Mottled: No Localized Edema: Yes Pallor: No Rash: No Rubor: No Scarring: No Temperature / Pain Moisture Tenderness on Palpation: Yes No Abnormalities Noted: No Dry / Scaly: No Maceration: No Moist: Yes Wound Preparation Ulcer Cleansing: Other: soap and water, Topical Anesthetic Applied: Other: lidocaine 4%, Treatment Notes Wound #3 (Right, Medial Lower Leg) 1. Cleansed with: Clean wound with Normal Saline 2. Anesthetic Topical Lidocaine 4% cream to wound bed prior to debridement 3. Peri-wound Care: Skin Prep 4. Dressing Applied: Aquacel Ag 5. Secondary Dressing Applied Bordered Foam Dressing Electronic Signature(s) Signed: 01/30/2015 10:53:26 AM By: Alric Quan Entered By: Alric Quan on 01/29/2015 08:24:57 [name]. (  [id number], GANN ([id number]) -------------------------------------------------------------------------------- Wound Assessment Details Patient Name: [name], [name] Date of Service: 01/29/2015 8:00 AM Medical Record Number: [id number] Patient Account Number: [id number] Date of Birth/Sex: 03-21-1965 (50 y.o. Male) Treating RN: Ahmed Prima Primary Care Physician: [name], [place] Other Clinician: Referring Physician: [name], [place] Treating Physician/Extender: Frann Rider in Treatment: 4 Wound Status Wound Number: 4 Primary Venous Leg Ulcer Etiology: Wound Location: Right Lower Leg - Distal Wound Open Wounding Event: Gradually Appeared Status: Date Acquired: 12/13/2014 Comorbid Lymphedema,  Congestive Heart Weeks Of Treatment: 4 History: Failure, Hypertension, Type II Clustered Wound: No Diabetes, Gout Photos Photo Uploaded By: Alric Quan on 01/30/2015 15:19:54 Wound Measurements Length: (cm) 1.5 % Reduction i Width: (cm) 1.4 % Reduction i Depth: (cm) 1 Epithelializa Area: (cm) 1.649 Tunneling: Volume: (cm) 1.649 Undermining: n Area: -7.6% n Volume: -438.9% tion: None No No Wound Description Full Thickness Without Classification: Exposed Support Structures Diabetic Severity Grade 1 (Wagner): Wound Margin: Flat and Intact Exudate Amount: Large Exudate Type: Serous Exudate Color: amber Foul Odor After Cleansing: No Wound Bed Granulation Amount: Medium (34-66%) Exposed Structure [name], [name]. ([id number]) Granulation Quality: Red, Pink Fascia Exposed: No Necrotic Amount: Medium (34-66%) Fat Layer Exposed: No Necrotic Quality: Adherent Slough Tendon Exposed: No Muscle Exposed: No Joint Exposed: No Bone Exposed: No Limited to Skin Breakdown Periwound Skin Texture Texture Color No Abnormalities Noted: No No Abnormalities Noted: No Callus: No Atrophie Blanche: No Crepitus: No Cyanosis: No Excoriation: No Ecchymosis: No Fluctuance: No Erythema: No Friable: No Hemosiderin Staining: No Induration: No Mottled: No Localized Edema: Yes Pallor: No Rash: No Rubor: No Scarring: No Temperature / Pain Moisture Temperature: No Abnormality No Abnormalities Noted: No Tenderness on Palpation: Yes Dry / Scaly: No Maceration: No Moist: Yes Wound Preparation Ulcer Cleansing: Other: soap and water, Topical Anesthetic Applied: Other: lidocaine 4%, Treatment Notes Wound #4 (Right, Distal Lower Leg) 1. Cleansed with: Clean wound with Normal Saline 2. Anesthetic Topical Lidocaine 4% cream to wound bed prior to debridement 3. Peri-wound Care: Skin Prep 4. Dressing Applied: Aquacel Ag 5. Secondary Dressing Applied Bordered Foam  Dressing Electronic Signature(s) Signed: 01/30/2015 10:53:26 AM By: Alric Quan Entered By: Alric Quan on 01/29/2015 08:27:00 [name] ([id number]) [name] ([id number]) -------------------------------------------------------------------------------- [place] Details Patient Name: [name] Date of Service: 01/29/2015 8:00 AM Medical Record Number: [id number] Patient Account Number: [id number] Date of Birth/Sex: 1965-04-25 (49 y.o. Male) Treating RN: Ahmed Prima Primary Care Physician: [name], [place] Other Clinician: Referring Physician: [name], MELINDA Treating Physician/Extender: Frann Rider in Treatment: 4 Vital Signs Time Taken: 08:10 Temperature (F): 98.1 Height (in): 71 Pulse (bpm): 91 Weight (lbs): 360 Respiratory Rate (breaths/min): 18 Body Mass Index (BMI): 50.2 Blood Pressure (mmHg): 169/100 Reference Range: 80 - 120 mg / dl Notes pt states that he knew his Bp would high because he has not had his BP meds this morning and he is aggravated and hot. Electronic Signature(s) Signed: 01/30/2015 10:53:26 AM By: Alric Quan Entered By: Alric Quan on 01/29/2015 08:14:48

## 2015-02-04 ENCOUNTER — Other Ambulatory Visit: Payer: Self-pay | Admitting: Family Medicine

## 2015-02-05 ENCOUNTER — Encounter: Payer: Managed Care, Other (non HMO) | Admitting: Surgery

## 2015-02-05 DIAGNOSIS — E11622 Type 2 diabetes mellitus with other skin ulcer: Secondary | ICD-10-CM | POA: Diagnosis not present

## 2015-02-05 NOTE — Telephone Encounter (Signed)
Can you please add his nephrologist to the care team and then ask the nephrologist if they will kindly take over the allopurinol Rx? We are not monitoring his kidney function any more to prevent duplicate labs, so I'd very much appreciate if they could do this Rx If not, back to me and request his last labs from them Thank you

## 2015-02-05 NOTE — Progress Notes (Addendum)
[name], [name] ([id number]) Visit Report for 02/05/2015 Chief Complaint Document Details Patient Name: [name], [name] Date of Service: 02/05/2015 11:30 AM Medical Record Number: [id number] Patient Account Number: [id number] Date of Birth/Sex: 1965-06-28 (50 y.o. Male) Treating RN: Baruch Gouty, RN, BSN, Velva Harman Primary Care Physician: Enid Derry Other Clinician: Referring Physician: [name], MELINDA Treating Physician/Extender: Frann Rider in Treatment: 5 Information Obtained from: Patient Chief Complaint Patients presents for treatment of an open diabetic ulcer over the right lower extremity with swelling of both legs for about 1 month. Electronic Signature(s) Signed: 02/05/2015 11:49:16 AM By: Christin Fudge MD, FACS Entered By: Christin Fudge on 02/05/2015 11:49:16 [name] ([id number]) -------------------------------------------------------------------------------- HPI Details Patient Name: [name] Date of Service: 02/05/2015 11:30 AM Medical Record Number: [id number] Patient Account Number: [id number] Date of Birth/Sex: Nov 16, 1965 (50 y.o. Male) Treating RN: Baruch Gouty, RN, BSN, Velva Harman Primary Care Physician: [name], Rip Harbour Other Clinician: Referring Physician: [name], MELINDA Treating Physician/Extender: Frann Rider in Treatment: 5 History of Present Illness Location: Started getting pustules and boils on his right lower extremity along with swelling and was treated for this Quality: Patient reports experiencing a dull pain to affected area(s). Severity: Patient states wound are getting worse. Duration: Patient has had the wound for < 4 weeks prior to presenting for treatment Timing: Pain in wound is Intermittent (comes and goes Context: The wound appeared gradually over time Modifying Factors: Other treatment(s) tried include: local care and antibiotics and some compression stockings. Associated Signs and Symptoms: Patient reports having difficulty  standing for long periods. HPI Description: 50 year old gentleman who has type 2 diabetes mellitus and morbid obesity has been having cellulitis and abscess of the right lower leg for the last 3 weeks. He has had multiple antibiotics and an IandD and over the last 10 days has been on Septra DS and doxycycline and prior to that Keflex. the patient is a poor historian and does not give a definite timeline but says he's been diagnosed with diabetes mellitus for about 8-10 months and has been on insulin. He does not measure his blood sugars regularly nor does he see his PCP regularly. Also has congestive heart failure for which he sees a cardiologist at [facility] but this is going to change as the cardiologist as retired. He is a smoker and smokes several cigars a day and drinks alcohol occasionally. He has been morbidly obese for several years. 01/08/2015 -- the patient complains about having to use his 3 layer compression wrap and has multiple issues with this none of them being medically oriented. He also continues to smoke, has not yet seen his cardiologist or his PCP and in general has continued to not look after himself. 01/15/2015 -- the patient did not allow [place] to put his 3 layer compression wrap last week and has been using his own compression stockings. Continues to smoke. He did see his cardiologist and they have increased his dose of Lasix, asked him to repeat an echo, lose weight and be compliant with his diabetic control. 01/29/2015 -- he has given up smoking for about the last week. He is unable to keep his appointment on 01/30/2015 with the vascular surgeons and has rescheduled it for next week. Electronic Signature(s) Signed: 02/05/2015 11:49:25 AM By: Christin Fudge MD, FACS Entered By: Christin Fudge on 02/05/2015 11:49:24 [name] ([id number]) -------------------------------------------------------------------------------- Physical Exam Details Patient Name: [name] Date of Service: 02/05/2015 11:30 AM Medical Record Number: [id number] Patient Account Number: [id number] Date of Birth/Sex:  06-11-1965 (50 y.o. Male) Treating RN: Baruch Gouty, RN, BSN, Velva Harman Primary Care Physician: [name], Rip Harbour Other Clinician: Referring Physician: [name], MELINDA Treating Physician/Extender: Frann Rider in Treatment: 5 Constitutional . Pulse regular. Respirations normal and unlabored. Afebrile. . Eyes Nonicteric. Reactive to light. Ears, Nose, Mouth, and Throat Lips, teeth, and gums WNL.[name] Kitchen Moist mucosa without lesions. Neck supple and nontender. No palpable supraclavicular or cervical adenopathy. Normal sized without goiter. Respiratory WNL. No retractions.. Cardiovascular Pedal Pulses WNL. No clubbing, cyanosis or edema. Lymphatic No adneopathy. No adenopathy. No adenopathy. Musculoskeletal Adexa without tenderness or enlargement.. Digits and nails w/o clubbing, cyanosis, infection, petechiae, ischemia, or inflammatory conditions.. Integumentary (Hair, Skin) No suspicious lesions. No crepitus or fluctuance. No peri-wound warmth or erythema. No masses.[name] Kitchen Psychiatric Judgement and insight Intact.. No evidence of depression, anxiety, or agitation.. Notes though his edema has improved markedly he still continues to have several of the wounds tunneling and undermining significantly down to about 2-1/2 cm on the superior wound. Electronic Signature(s) Signed: 02/05/2015 11:50:05 AM By: Christin Fudge MD, FACS Entered By: Christin Fudge on 02/05/2015 11:50:05 [name] ([id number]) -------------------------------------------------------------------------------- Physician Orders Details Patient Name: [name] Date of Service: 02/05/2015 11:30 AM Medical Record Number: [id number] Patient Account Number: [id number] Date of Birth/Sex: 04-Apr-1965 (50 y.o. 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 Wound #3 Right,Medial Lower Leg o Cleanse wound with mild soap and water o May Shower, gently pat wound dry prior to applying new dressing. Wound #4 Right,Distal Lower Leg o Cleanse wound with mild soap and water o May Shower, gently pat wound dry prior to applying new dressing. Anesthetic Wound #2 Right,Lateral,Superior Lower Leg o Topical Lidocaine 4% cream applied to wound bed prior to debridement Wound #3 Right,Medial Lower Leg o Topical Lidocaine 4% cream applied to wound bed prior to debridement Wound #4 Right,Distal Lower Leg o Topical Lidocaine 4% cream applied to wound bed prior to debridement Primary Wound Dressing Wound #2 Right,Lateral,Superior Lower Leg o Aquacel Ag LINDAL, TOMITA ([id number]) Wound #3 Right,Medial Lower Leg o Aquacel Ag Wound #4 Right,Distal Lower Leg o Aquacel Ag Secondary Dressing Wound #2 Right,Lateral,Superior Lower Leg o Boardered Foam Dressing Wound #3 Right,Medial Lower Leg o Boardered Foam Dressing Wound #4 Right,Distal Lower Leg o Boardered Foam Dressing Dressing Change Frequency Wound #2  Right,Lateral,Superior Lower Leg o Change dressing every other day. Wound #3 Right,Medial Lower Leg o Change dressing every other day. Wound #4 Right,Distal Lower Leg o Change dressing every other day. Follow-up Appointments Wound #2 Right,Lateral,Superior Lower Leg o Return Appointment in 1 week. Wound #3 Right,Medial Lower Leg o Return Appointment in 1 week. Wound #4 Right,Distal Lower Leg o Return Appointment in 1 week. Edema Control Wound #2 Right,Lateral,Superior Lower Leg o Patient to wear own compression stockings o Elevate legs to the level of  the heart and pump ankles as often as possible Wound #3 Right,Medial Lower Leg o Patient to wear own compression stockings o Elevate legs to the level of the heart and pump ankles as often as possible Wound #4 Right,Distal Lower Leg o Patient to wear own compression stockings [name], [name] ([id number]) o Elevate legs to the level of the heart and pump ankles as often as possible Electronic Signature(s) Signed: 02/05/2015 4:32:01 PM By: Christin Fudge MD, FACS Signed: 02/05/2015 4:56:50 PM By: Regan Lemming BSN, RN Entered By: Regan Lemming on 02/05/2015 11:46:23 [name] ([id number]) -------------------------------------------------------------------------------- Problem List Details Patient Name: [name] Date of Service: 02/05/2015 11:30 AM Medical Record Number: [id number] Patient Account Number: [id number] Date of Birth/Sex: 1965/04/15 (49 y.o. Male) Treating RN: Baruch Gouty, RN, BSN, [place] Primary Care Physician: [name], Rip Harbour Other Clinician: Referring Physician: [name], [place] Treating Physician/Extender: Frann Rider in Treatment: 5 Active Problems ICD-10 Encounter Code Description Active Date Diagnosis E11.622 Type 2 diabetes mellitus with other skin ulcer 01/01/2015 Yes E66.01 Morbid (severe) obesity due to excess calories 01/01/2015 Yes L97.212 Non-pressure chronic ulcer of  right calf with fat layer 02/05/2015 Yes exposed I89.0 Lymphedema, not elsewhere classified 01/01/2015 Yes I50.40 Unspecified combined systolic (congestive) and diastolic [id number] Yes (congestive) heart failure F17.218 Nicotine dependence, cigarettes, with other nicotine- 01/01/2015 Yes induced disorders Inactive Problems Resolved Problems ICD-10 Code Description Active Date Resolved Date L02.415 Cutaneous abscess of right lower limb 01/01/2015 01/01/2015 Electronic Signature(s) Signed: 02/05/2015 11:49:06 AM By: Christin Fudge MD, FACS Previous Signature: 02/05/2015 11:39:01 AM Version By: Christin Fudge MD, FACS KASIN, MALVIN ([id number]) Entered By: Christin Fudge on 02/05/2015 11:49:06 [name] ([id number]) -------------------------------------------------------------------------------- Progress Note Details Patient Name: [name] Date of Service: 02/05/2015 11:30 AM Medical Record Number: [id number] Patient Account Number: [id number] Date of Birth/Sex: November 18, 1965 (50 y.o. Male) Treating RN: Baruch Gouty, RN, BSN, [place] Primary Care Physician: [name], Rip Harbour Other Clinician: Referring Physician: [name], MELINDA Treating Physician/Extender: Frann Rider in Treatment: 5 Subjective Chief Complaint Information obtained from Patient Patients presents for treatment of an open diabetic ulcer over the right lower extremity with swelling of both legs for about 1 month. History of Present Illness (HPI) The following HPI elements were documented for the patient's wound: Location: Started getting pustules and boils on his right lower extremity along with swelling and was treated for this Quality: Patient reports experiencing a dull pain to affected area(s). Severity: Patient states wound are getting worse. Duration: Patient has had the wound for < 4 weeks prior to presenting for treatment Timing: Pain in wound is Intermittent (comes and goes Context: The wound appeared  gradually over time Modifying Factors: Other treatment(s) tried include: local care and antibiotics and some compression stockings. Associated Signs and Symptoms: Patient reports having difficulty standing for long periods. 50 year old gentleman who has type 2 diabetes mellitus and morbid obesity has been having cellulitis and abscess of the right lower leg for the last 3 weeks. He has had multiple antibiotics and an IandD and over the last 10 days has been on Septra DS and doxycycline and prior to that Keflex. the patient is a poor historian and does not give a definite timeline but says he's been diagnosed with diabetes mellitus for about 8-10 months and has been on insulin. He does not measure his blood sugars regularly nor does he see his PCP regularly. Also has congestive heart failure for which he sees a cardiologist at [facility] but this is going to change as the  cardiologist as retired. He is a smoker and smokes several cigars a day and drinks alcohol occasionally. He has been morbidly obese for several years. 01/08/2015 -- the patient complains about having to use his 3 layer compression wrap and has multiple issues with this none of them being medically oriented. He also continues to smoke, has not yet seen his cardiologist or his PCP and in general has continued to not look after himself. 01/15/2015 -- the patient did not allow [place] to put his 3 layer compression wrap last week and has been using his own compression stockings. Continues to smoke. He did see his cardiologist and they have increased his dose of Lasix, asked him to repeat an echo, lose weight and be compliant with his diabetic control. 01/29/2015 -- he has given up smoking for about the last week. He is unable to keep his appointment on 01/30/2015 with the vascular surgeons and has rescheduled it for next week. [name], [name] ([id number]) Objective Constitutional Pulse regular. Respirations normal and unlabored.  Afebrile. Vitals Time Taken: 11:33 AM, Height: 71 in, Weight: 360 lbs, BMI: 50.2, Temperature: 98.1 F, Pulse: 90 bpm, Respiratory Rate: 20 breaths/min, Blood Pressure: 117/98 mmHg. Eyes Nonicteric. Reactive to light. Ears, Nose, Mouth, and Throat Lips, teeth, and gums WNL.[name] Kitchen Moist mucosa without lesions. Neck supple and nontender. No palpable supraclavicular or cervical adenopathy. Normal sized without goiter. Respiratory WNL. No retractions.. Cardiovascular Pedal Pulses WNL. No clubbing, cyanosis or edema. Lymphatic No adneopathy. No adenopathy. No adenopathy. Musculoskeletal Adexa without tenderness or enlargement.. Digits and nails w/o clubbing, cyanosis, infection, petechiae, ischemia, or inflammatory conditions.[name] Kitchen Psychiatric Judgement and insight Intact.. No evidence of depression, anxiety, or agitation.. General Notes: though his edema has improved markedly he still continues to have several of the wounds tunneling and undermining significantly down to about 2-1/2 cm on the superior wound. Integumentary (Hair, Skin) No suspicious lesions. No crepitus or fluctuance. No peri-wound warmth or erythema. No masses.. Wound #2 status is Open. Original cause of wound was Gradually Appeared. The wound is located on the Right,Lateral,Superior Lower Leg. The wound measures 0.5cm length x 0.5cm width x 1cm depth; 0.196cm^2 area and 0.196cm^3 volume. The wound is limited to skin breakdown. There is no undermining [name], [name]. ([id number]) noted, however, there is tunneling at 12:00 with a maximum distance of 1.5cm. There is a large amount of purulent drainage noted. The wound margin is flat and intact. There is medium (34-66%) pink granulation within the wound bed. There is a medium (34-66%) amount of necrotic tissue within the wound bed including Adherent Slough. The periwound skin appearance exhibited: Localized Edema, Moist. Periwound temperature was noted as No Abnormality. The  periwound has tenderness on palpation. Wound #3 status is Open. Original cause of wound was Gradually Appeared. The wound is located on the Right,Medial Lower Leg. The wound measures 1.5cm length x 1cm width x 1.5cm depth; 1.178cm^2 area and 1.767cm^3 volume. The wound is limited to skin breakdown. There is no tunneling or undermining noted. There is a large amount of serous drainage noted. The wound margin is flat and intact. There is medium (34-66%) red, pink granulation within the wound bed. There is a medium (34-66%) amount of necrotic tissue within the wound bed including Adherent Slough. The periwound skin appearance exhibited: Localized Edema, Moist. The periwound skin appearance did not exhibit: Callus, Crepitus, Excoriation, Fluctuance, Friable, Induration, Rash, Scarring, Dry/Scaly, Maceration, Atrophie Blanche, Cyanosis, Ecchymosis, Hemosiderin Staining, Mottled, Pallor, Rubor, Erythema. The periwound has tenderness on palpation. Wound #4  status is Open. Original cause of wound was Gradually Appeared. The wound is located on the Right,Distal Lower Leg. The wound measures 1.5cm length x 1.2cm width x 1.5cm depth; 1.414cm^2 area and 2.121cm^3 volume. The wound is limited to skin breakdown. There is no tunneling or undermining noted. There is a large amount of serosanguineous drainage noted. The wound margin is flat and intact. There is medium (34-66%) red, pink granulation within the wound bed. There is a medium (34-66%) amount of necrotic tissue within the wound bed including Adherent Slough. The periwound skin appearance exhibited: Localized Edema, Moist. The periwound skin appearance did not exhibit: Callus, Crepitus, Excoriation, Fluctuance, Friable, Induration, Rash, Scarring, Dry/Scaly, Maceration, Atrophie Blanche, Cyanosis, Ecchymosis, Hemosiderin Staining, Mottled, Pallor, Rubor, Erythema. Periwound temperature was noted as No Abnormality. The periwound has tenderness on  palpation. Assessment Active Problems ICD-10 E11.622 - Type 2 diabetes mellitus with other skin ulcer E66.01 - Morbid (severe) obesity due to excess calories L97.212 - Non-pressure chronic ulcer of right calf with fat layer exposed I89.0 - Lymphedema, not elsewhere classified I50.40 - Unspecified combined systolic (congestive) and diastolic (congestive) heart failure F17.218 - Nicotine dependence, cigarettes, with other nicotine-induced disorders Plan [name], [name] ([id number]) Wound Cleansing: Wound #2 Right,Lateral,Superior Lower Leg: Cleanse wound with mild soap and water May Shower, gently pat wound dry prior to applying new dressing. Wound #3 Right,Medial Lower Leg: Cleanse wound with mild soap and water May Shower, gently pat wound dry prior to applying new dressing. Wound #4 Right,Distal Lower Leg: Cleanse wound with mild soap and water May Shower, gently pat wound dry prior to applying new dressing. Anesthetic: Wound #2 Right,Lateral,Superior Lower Leg: Topical Lidocaine 4% cream applied to wound bed prior to debridement Wound #3 Right,Medial Lower Leg: Topical Lidocaine 4% cream applied to wound bed prior to debridement Wound #4 Right,Distal Lower Leg: Topical Lidocaine 4% cream applied to wound bed prior to debridement Primary Wound Dressing: Wound #2 Right,Lateral,Superior Lower Leg: Aquacel Ag Wound #3 Right,Medial Lower Leg: Aquacel Ag Wound #4 Right,Distal Lower Leg: Aquacel Ag Secondary Dressing: Wound #2 Right,Lateral,Superior Lower Leg: Boardered Foam Dressing Wound #3 Right,Medial Lower Leg: Boardered Foam Dressing Wound #4 Right,Distal Lower Leg: Boardered Foam Dressing Dressing Change Frequency: Wound #2 Right,Lateral,Superior Lower Leg: Change dressing every other day. Wound #3 Right,Medial Lower Leg: Change dressing every other day. Wound #4 Right,Distal Lower Leg: Change dressing every other day. Follow-up Appointments: Wound #2  Right,Lateral,Superior Lower Leg: Return Appointment in 1 week. Wound #3 Right,Medial Lower Leg: Return Appointment in 1 week. Wound #4 Right,Distal Lower Leg: Return Appointment in 1 week. Edema Control: Wound #2 Right,Lateral,Superior Lower Leg: Patient to wear own compression stockings Elevate legs to the level of the heart and pump ankles as often as possible Wound #3 Right,Medial Lower Leg: [name], [name] ([id number]) Patient to wear own compression stockings Elevate legs to the level of the heart and pump ankles as often as possible Wound #4 Right,Distal Lower Leg: Patient to wear own compression stockings Elevate legs to the level of the heart and pump ankles as often as possible There are spent some time discussing his wound care and have asked them to pack it with strips of silver alginate down into the depths. As noted before he will not use compression wraps but is using his stockings for edema control. He is till off smoking. Electronic Signature(s) Signed: 02/05/2015 11:51:03 AM By: Christin Fudge MD, FACS Entered By: Christin Fudge on 02/05/2015 11:51:03 [name] ([id number]) -------------------------------------------------------------------------------- [place] Details Patient  Name: [name], [name] Date of Service: 02/05/2015 Medical Record Number: [id number] Patient Account Number: [id number] Date of Birth/Sex: 10/21/1965 (50 y.o. 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 I89.0 Lymphedema, not elsewhere classified I50.40 Unspecified combined systolic (congestive) and  diastolic (congestive) heart failure F17.218 Nicotine dependence, cigarettes, with other nicotine-induced disorders Physician Procedures CPT4 Code: [id number] Description: 99213 - WC PHYS LEVEL 3 - EST PT ICD-10 Description Diagnosis E11.622 Type 2 diabetes mellitus with other skin ulcer E66.01 Morbid (severe) obesity due to excess calories L97.212 Non-pressure chronic ulcer of right calf with fat I89.0  Lymphedema, not elsewhere classified Modifier: layer exposed Quantity: 1 Electronic Signature(s) Signed: 02/05/2015 11:51:39 AM By: Christin Fudge MD, FACS Previous Signature: 02/05/2015 11:51:19 AM Version By: Christin Fudge MD, FACS Entered By: Christin Fudge on 02/05/2015 11:51:38

## 2015-02-05 NOTE — Telephone Encounter (Signed)
Looks like you've been seeing this guy

## 2015-02-05 NOTE — Telephone Encounter (Signed)
Care team updated. Left detailed message asking if they will take over prescribing the allopurinol.

## 2015-02-06 ENCOUNTER — Other Ambulatory Visit: Payer: Self-pay

## 2015-02-06 MED ORDER — ALBUTEROL SULFATE HFA 108 (90 BASE) MCG/ACT IN AERS: 2.0000 | INHALATION_SPRAY | RESPIRATORY_TRACT | Status: DC | PRN

## 2015-02-06 NOTE — Telephone Encounter (Signed)
Patient notified, he states that the side effects [id number] him and he won't take it anymore.

## 2015-02-06 NOTE — Progress Notes (Signed)
[name], [name] ([id number]) Visit Report for 02/05/2015 Arrival Information Details Patient Name: [name], [name] Date of Service: 02/05/2015 11:30 AM Medical Record Number: [id number] Patient Account Number: [id number] Date of Birth/Sex: 1965-09-04 (49 y.o. Male) Treating RN: Baruch Gouty, RN, BSN, Velva Harman Primary Care Physician: [name], Rip Harbour Other Clinician: Referring Physician: [name], [place] Treating Physician/Extender: Frann Rider in Treatment: 5 Visit Information History Since Last Visit Added or deleted any medications: No Patient Arrived: Ambulatory Any new allergies or adverse reactions: No Arrival Time: 11:29 Had a fall or experienced change in No Accompanied By: self activities of daily living that may affect Transfer Assistance: None risk of falls: Patient Identification Verified: Yes Signs or symptoms of abuse/neglect since last No Secondary Verification Process Yes visito Completed: Hospitalized since last visit: No Patient Requires Transmission-Based No Has Dressing in Place as Prescribed: Yes Precautions: Has Compression in Place as Prescribed: Yes Patient Has Alerts: Yes Pain Present Now: No Patient Alerts: DM II Electronic Signature(s) Signed: 02/05/2015 4:56:50 PM By: Regan Lemming BSN, RN Entered By: Regan Lemming on 02/05/2015 11:30:03 [name] ([id number]) -------------------------------------------------------------------------------- Clinic Level of Care Assessment Details Patient Name: [name] Date of Service: 02/05/2015 11:30 AM Medical Record Number: [id number] Patient Account Number: [id number] Date of Birth/Sex: [birth year] (49 y.o. Male) Treating RN: Baruch Gouty, RN, BSN, [place] Primary Care Physician: [name], Rip Harbour Other Clinician: Referring Physician: [name], MELINDA Treating Physician/Extender: Frann Rider in Treatment: 5 Clinic Level of Care Assessment Items TOOL 4 Quantity Score '[]'  - Use when only an EandM is  performed on FOLLOW-UP visit 0 ASSESSMENTS - Nursing Assessment / Reassessment X - Reassessment of Co-morbidities (includes updates in patient status) 1 10 X - Reassessment of Adherence to Treatment Plan 1 5 ASSESSMENTS - Wound and Skin Assessment / Reassessment '[]'  - Simple Wound Assessment / Reassessment - one wound 0 X - Complex Wound Assessment / Reassessment - multiple wounds 3 5 '[]'  - Dermatologic / Skin Assessment (not related to wound area) 0 ASSESSMENTS - Focused Assessment '[]'  - Circumferential Edema Measurements - multi extremities 0 '[]'  - Nutritional Assessment / Counseling / Intervention 0 X - Lower Extremity Assessment (monofilament, tuning fork, pulses) 1 5 '[]'  - Peripheral Arterial Disease Assessment (using hand held doppler) 0 ASSESSMENTS - Ostomy and/or Continence Assessment and Care '[]'  - Incontinence Assessment and Management 0 '[]'  - Ostomy Care Assessment and Management (repouching, etc.) 0 PROCESS - Coordination of Care X - Simple Patient / Family Education for ongoing care 1 15 '[]'  - Complex (extensive) Patient / Family Education for ongoing care 0 '[]'  - Staff obtains Programmer, systems, Records, Test Results / Process Orders 0 '[]'  - Staff telephones HHA, Nursing Homes / Clarify orders / etc 0 '[]'  - Routine Transfer to another Facility (non-emergent condition) 0 GRABIEL, SCHMUTZ ([id number]) '[]'  - Routine Hospital Admission (non-emergent condition) 0 '[]'  - New Admissions / Insurance Authorizations / Ordering NPWT, Apligraf, etc. 0 '[]'  - Emergency Hospital Admission (emergent condition) 0 '[]'  - Simple Discharge Coordination 0 '[]'  - Complex (extensive) Discharge Coordination 0 PROCESS - Special Needs '[]'  - Pediatric / Minor Patient Management 0 '[]'  - Isolation Patient Management 0 '[]'  - Hearing / Language / Visual special needs 0 '[]'  - Assessment of Community assistance (transportation, D/C planning, etc.) 0 '[]'  - Additional assistance / Altered mentation 0 '[]'  - Support Surface(s) Assessment  (bed, cushion, seat, etc.) 0 INTERVENTIONS - Wound Cleansing / Measurement '[]'  - Simple Wound Cleansing - one wound 0 X - Complex Wound Cleansing - multiple wounds  3 5 X - Wound Imaging (photographs - any number of wounds) 1 5 '[]'  - Wound Tracing (instead of photographs) 0 '[]'  - Simple Wound Measurement - one wound 0 X - Complex Wound Measurement - multiple wounds 3 5 INTERVENTIONS - Wound Dressings X - Small Wound Dressing one or multiple wounds 3 10 '[]'  - Medium Wound Dressing one or multiple wounds 0 '[]'  - Large Wound Dressing one or multiple wounds 0 '[]'  - Application of Medications - topical 0 '[]'  - Application of Medications - injection 0 INTERVENTIONS - Miscellaneous '[]'  - External ear exam 0 SMAYAN, HACKBART ([id number]) '[]'  - Specimen Collection (cultures, biopsies, blood, body fluids, etc.) 0 '[]'  - Specimen(s) / Culture(s) sent or taken to Lab for analysis 0 '[]'  - Patient Transfer (multiple staff / Harrel Lemon Lift / Similar devices) 0 '[]'  - Simple Staple / Suture removal (25 or less) 0 '[]'  - Complex Staple / Suture removal (26 or more) 0 '[]'  - Hypo / Hyperglycemic Management (close monitor of Blood Glucose) 0 '[]'  - Ankle / Brachial Index (ABI) - do not check if billed separately 0 X - Vital Signs 1 5 Has the patient been seen at the hospital within the last three years: Yes Total Score: 120 Level Of Care: New/Established - Level 4 Electronic Signature(s) Signed: 02/05/2015 4:56:50 PM By: Regan Lemming BSN, RN Entered By: Regan Lemming on 02/05/2015 11:55:57 [name] ([id number]) -------------------------------------------------------------------------------- Encounter Discharge Information Details Patient Name: [name] Date of Service: 02/05/2015 11:30 AM Medical Record Number: [id number] Patient Account Number: [id number] Date of Birth/Sex: 1965-07-13 (49 y.o. Male) Treating RN: Baruch Gouty, RN, BSN, Velva Harman Primary Care Physician: [name], Rip Harbour Other Clinician: Referring  Physician: [name], MELINDA Treating Physician/Extender: Frann Rider in Treatment: 5 Encounter Discharge Information Items Discharge Pain Level: 0 Discharge Condition: Stable Ambulatory Status: Ambulatory Discharge Destination: Home Transportation: Private Auto Accompanied By: self Schedule Follow-up Appointment: No Medication Reconciliation completed and provided to Patient/Care No Tallie Hevia: Provided on Clinical Summary of Care: 02/05/2015 Form Type Recipient Paper Patient RT Electronic Signature(s) Signed: 02/05/2015 11:57:27 AM By: Ruthine Dose Entered By: Ruthine Dose on 02/05/2015 11:57:27 [name] ([id number]) -------------------------------------------------------------------------------- Lower Extremity Assessment Details Patient Name: [name] Date of Service: 02/05/2015 11:30 AM Medical Record Number: [id number] Patient Account Number: [id number] Date of Birth/Sex: [birth year] (49 y.o. Male) Treating RN: Baruch Gouty, RN, BSN, Velva Harman Primary Care Physician: [name], Rip Harbour Other Clinician: Referring Physician: [name], MELINDA Treating Physician/Extender: Frann Rider in Treatment: 5 Vascular Assessment Claudication: Claudication Assessment [Right:None] Pulses: Posterior Tibial Dorsalis Pedis Palpable: [Right:Yes] Extremity colors, hair growth, and conditions: Extremity Color: [Right:Hyperpigmented] Hair Growth on Extremity: [Right:Yes] Temperature of Extremity: [Right:Warm] Capillary Refill: [Right:< 3 seconds] Electronic Signature(s) Signed: 02/05/2015 4:56:50 PM By: Regan Lemming BSN, RN Entered By: Regan Lemming on 02/05/2015 11:30:40 [name] ([id number]) -------------------------------------------------------------------------------- Multi Wound Chart Details Patient Name: [name] Date of Service: 02/05/2015 11:30 AM Medical Record Number: [id number] Patient Account Number: [id number] Date of Birth/Sex: July 20, 1965 (50  y.o. Male) Treating RN: Baruch Gouty, RN, BSN, Vel[place] Harman Primary Care Physician: [name], Rip Harbour Other Clinician: Referring Physician: [name], MELINDA Treating Physician/Extender: Frann Rider in Treatment: 5 Vital Signs Height(in): 71 Pulse(bpm): 90 Weight(lbs): 360 Blood Pressure 117/98 (mmHg): Body Mass Index(BMI): 50 Temperature(F): 98.1 Respiratory Rate 20 (breaths/min): Photos: [2:No Photos] [3:No Photos] [4:No Photos] Wound Location: [2:Right Lower Leg - Lateral, Right Lower Leg - Medial Right Lower Leg - Distal Superior] Wounding Event: [2:Gradually Appeared] [3:Gradually Appeared] [4:Gradually Appeared] Primary Etiology: [2:Venous Leg Ulcer] [  3:Venous Leg Ulcer] [4:Venous Leg Ulcer] Comorbid History: [2:Lymphedema, Congestive Lymphedema, Congestive Lymphedema, Congestive Heart Failure, Hypertension, Type II Diabetes, Gout] [3:Heart Failure, Hypertension, Type II Diabetes, Gout] [4:Heart Failure, Hypertension, Type II Diabetes, Gout] Date Acquired: [2:12/13/2014] [3:12/13/2014] [4:12/13/2014] Weeks of Treatment: [2:5] [3:5] [4:5] Wound Status: [2:Open] [3:Open] [4:Open] Measurements L x W x D 0.5x0.5x1 [3:1.5x1x1.5] [4:1.5x1.2x1.5] (cm) Area (cm) : [2:0.196] [3:1.178] [4:1.414] Volume (cm) : [2:0.196] [3:1.767] [4:2.121] % Reduction in Area: [2:75.00%] [3:37.50%] [4:7.70%] % Reduction in Volume: -24.80% [3:-368.70%] [4:-593.10%] Position 1 (o'clock): 12 Maximum Distance 1 1.5 (cm): Tunneling: [2:Yes] [3:No] [4:No] Classification: [2:Full Thickness Without Exposed Support Structures] [3:Full Thickness Without Exposed Support Structures] [4:Full Thickness Without Exposed Support Structures] HBO Classification: [2:Grade 1] [3:Grade 1] [4:Grade 1] Exudate Amount: [2:Large] [3:Large] [4:Large] Exudate Type: [2:Purulent] [3:Serous] [4:Serosanguineous] Exudate Color: [2:yellow, brown, green] [3:amber] [4:red, brown] Wound Margin: [2:Flat and Intact] [3:Flat and Intact] [4:Flat  and Intact] Granulation Amount: Medium (34-66%) Medium (34-66%) Medium (34-66%) Granulation Quality: Pink Red, Pink Red, Pink Necrotic Amount: Medium (34-66%) Medium (34-66%) Medium (34-66%) Exposed Structures: Fascia: No Fascia: No Fascia: No Fat: No Fat: No Fat: No Tendon: No Tendon: No Tendon: No Muscle: No Muscle: No Muscle: No Joint: No Joint: No Joint: No Bone: No Bone: No Bone: No Limited to Skin Limited to Skin Limited to Skin Breakdown Breakdown Breakdown Epithelialization: None None None Periwound Skin Texture: Edema: Yes Edema: Yes Edema: Yes Excoriation: No Excoriation: No Induration: No Induration: No Callus: No Callus: No Crepitus: No Crepitus: No Fluctuance: No Fluctuance: No Friable: No Friable: No Rash: No Rash: No Scarring: No Scarring: No Periwound Skin Moist: Yes Moist: Yes Moist: Yes Moisture: Maceration: No Maceration: No Dry/Scaly: No Dry/Scaly: No Periwound Skin Color: No Abnormalities Noted Atrophie Blanche: No Atrophie Blanche: No Cyanosis: No Cyanosis: No Ecchymosis: No Ecchymosis: No Erythema: No Erythema: No Hemosiderin Staining: No Hemosiderin Staining: No Mottled: No Mottled: No Pallor: No Pallor: No Rubor: No Rubor: No Temperature: No Abnormality N/A No Abnormality Tenderness on Yes Yes Yes Palpation: Wound Preparation: Ulcer Cleansing: Other: Ulcer Cleansing: Other: Ulcer Cleansing: soap and water soap and water Rinsed/Irrigated with Saline Topical Anesthetic Topical Anesthetic Applied: Other: lidocaine Applied: Other: lidocaine Topical Anesthetic 4% 4% Applied: Other: lidocaine 4% Treatment Notes Electronic Signature(s) Signed: 02/05/2015 4:56:50 PM By: Regan Lemming BSN, RN Entered By: Regan Lemming on 02/05/2015 11:39:40 [name] ([id number]) Elvina Mattes, Okey Regal ([id number]) -------------------------------------------------------------------------------- [place]  Details Patient Name: [name] Date of Service: 02/05/2015 11:30 AM Medical Record Number: [id number] Patient Account Number: [id number] Date of Birth/Sex: 04/10/1965 (49 y.o. Male) Treating RN: Baruch Gouty, RN, BSN, Velva Harman Primary Care Physician: [name], Rip Harbour Other Clinician: Referring Physician: [name], MELINDA Treating Physician/Extender: Frann Rider in Treatment: 5 Active Inactive Abuse / Safety / Falls / Self Care Management Nursing Diagnoses: Potential for falls Goals: Patient will remain injury free Date Initiated: 01/01/2015 Goal Status: Active Patient/caregiver will verbalize/demonstrate measures taken to prevent injury and/or falls Date Initiated: 01/01/2015 Goal Status: Active Interventions: Assess fall risk on admission and as needed Notes: Nutrition Nursing Diagnoses: Imbalanced nutrition Goals: Patient/caregiver agrees to and verbalizes understanding of need to use nutritional supplements and/or vitamins as prescribed Date Initiated: 01/01/2015 Goal Status: Active Patient/caregiver verbalizes understanding of need to maintain therapeutic glucose control per primary care physician Date Initiated: 01/01/2015 Goal Status: Active Patient/caregiver will maintain therapeutic glucose control Date Initiated: 01/01/2015 Goal Status: Active Interventions: KARTER, HELLMER ([id number]) Provide education on elevated blood sugars and impact on wound healing Provide education  on nutrition Treatment Activities: Education provided on Nutrition : 01/08/2015 Notes: Orientation to the Wound Care Program Nursing Diagnoses: Knowledge deficit related to the wound healing center program Goals: Patient/caregiver will verbalize understanding of the [place] Program Date Initiated: 01/01/2015 Goal Status: Active Interventions: Provide education on orientation to the wound center Notes: Pain, Acute or Chronic Nursing Diagnoses: Pain, acute or chronic:  actual or potential Goals: Patient will verbalize adequate pain control and receive pain control interventions during procedures as needed Date Initiated: 01/01/2015 Goal Status: Active Patient/caregiver will verbalize adequate pain control between visits Date Initiated: 01/01/2015 Goal Status: Active Patient/caregiver will verbalize comfort level met Date Initiated: 01/01/2015 Goal Status: Active Interventions: Assess comfort goal upon admission Complete pain assessment as per visit requirements Notes: Wound/Skin Impairment RAMA, MCCLINTOCK ([id number]) Nursing Diagnoses: Impaired tissue integrity Knowledge deficit related to smoking impact on wound healing Knowledge deficit related to ulceration/compromised skin integrity Goals: Patient will demonstrate a reduced rate of smoking or cessation of smoking Date Initiated: 01/01/2015 Goal Status: Active Patient/caregiver will verbalize understanding of skin care regimen Date Initiated: 01/01/2015 Goal Status: Active Ulcer/skin breakdown will have a volume reduction of 30% by week 4 Date Initiated: 01/01/2015 Goal Status: Active Ulcer/skin breakdown will have a volume reduction of 50% by week 8 Date Initiated: 01/01/2015 Goal Status: Active Ulcer/skin breakdown will have a volume reduction of 80% by week 12 Date Initiated: 01/01/2015 Goal Status: Active Interventions: Assess patient/caregiver ability to obtain necessary supplies Assess patient/caregiver ability to perform ulcer/skin care regimen upon admission and as needed Notes: Electronic Signature(s) Signed: 02/05/2015 4:56:50 PM By: Regan Lemming BSN, RN Entered By: Regan Lemming on 02/05/2015 11:39:29 [name] ([id number]) -------------------------------------------------------------------------------- Pain Assessment Details Patient Name: [name] Date of Service: 02/05/2015 11:30 AM Medical Record Number: [id number] Patient Account Number: [id number] Date  of Birth/Sex: 24-Aug-1965 (50 y.o. Male) Treating RN: Baruch Gouty, RN, BSN, Velva Harman Primary Care Physician: [name], Rip Harbour Other Clinician: Referring Physician: [name], [place] Treating Physician/Extender: Frann Rider in Treatment: 5 Active Problems Location of Pain Severity and Description of Pain Patient Has Paino No Site Locations Pain Management and Medication Current Pain Management: Electronic Signature(s) Signed: 02/05/2015 4:56:50 PM By: Regan Lemming BSN, RN Entered By: Regan Lemming on 02/05/2015 11:30:10 [name] ([id number]) -------------------------------------------------------------------------------- Patient/Caregiver Education Details Patient Name: [name] Date of Service: 02/05/2015 11:30 AM Medical Record Number: [id number] Patient Account Number: [id number] Date of Birth/Gender: 1965-05-03 (50 y.o. Male) Treating RN: Baruch Gouty, RN, BSN, [place] Primary Care Physician: [name], Rip Harbour Other Clinician: Referring Physician: [name], [place] Treating Physician/Extender: Frann Rider in Treatment: 5 Education Assessment Education Provided To: Patient Education Topics Provided Elevated Blood Sugar/ Impact on Healing: Methods: Explain/Verbal Responses: State content correctly Nutrition: Methods: Explain/Verbal Welcome To [facility]: Methods: Explain/Verbal Responses: State content correctly Electronic Signature(s) Signed: 02/05/2015 4:56:50 PM By: Regan Lemming BSN, RN Entered By: Regan Lemming on 02/05/2015 11:57:13 [name] ([id number]) -------------------------------------------------------------------------------- Wound Assessment Details Patient Name: [name] Date of Service: 02/05/2015 11:30 AM Medical Record Number: [id number] Patient Account Number: [id number] Date of Birth/Sex: [birth year] (50 y.o. Male) Treating RN: Baruch Gouty, RN, BSN, Velva Harman Primary Care Physician: [name], Rip Harbour Other Clinician: Referring Physician:  [name], MELINDA Treating Physician/Extender: Frann Rider in Treatment: 5 Wound Status Wound Number: 2 Primary Venous Leg Ulcer Etiology: Wound Location: Right Lower Leg - Lateral, Superior Wound Open Status: Wounding Event: Gradually Appeared Comorbid Lymphedema, Congestive Heart Date Acquired: 12/13/2014 History: Failure, Hypertension, Type II Weeks Of Treatment: 5  Diabetes, Gout Clustered Wound: No Photos Photo Uploaded By: Regan Lemming on 02/05/2015 16:36:39 Wound Measurements Length: (cm) 0.5 Width: (cm) 0.5 Depth: (cm) 1 Area: (cm) 0.196 Volume: (cm) 0.196 % Reduction in Area: 75% % Reduction in Volume: -24.8% Epithelialization: None Tunneling: Yes Position (o'clock): 12 Maximum Distance: (cm) 1.5 Undermining: No Wound Description Full Thickness Without Foul Odor Aft Classification: Exposed Support Structures Diabetic Severity Grade 1 (Wagner): Wound Margin: Flat and Intact Exudate Amount: Large Exudate Type: Purulent KAZUKI, INGLE ([id number]) er Cleansing: No Exudate Color: yellow, brown, green Wound Bed Granulation Amount: Medium (34-66%) Exposed Structure Granulation Quality: Pink Fascia Exposed: No Necrotic Amount: Medium (34-66%) Fat Layer Exposed: No Necrotic Quality: Adherent Slough Tendon Exposed: No Muscle Exposed: No Joint Exposed: No Bone Exposed: No Limited to Skin Breakdown Periwound Skin Texture Texture Color No Abnormalities Noted: No No Abnormalities Noted: No Localized Edema: Yes Temperature / Pain Moisture Temperature: No Abnormality No Abnormalities Noted: No Tenderness on Palpation: Yes Moist: Yes Wound Preparation Ulcer Cleansing: Other: soap and water, Topical Anesthetic Applied: Other: lidocaine 4%, Treatment Notes Wound #2 (Right, Lateral, Superior Lower Leg) 1. Cleansed with: Clean wound with Normal Saline 3. Peri-wound Care: Skin Prep 4. Dressing Applied: Aquacel Ag 5. Secondary Dressing  Applied Bordered Foam Dressing 7. Secured with Patient to wear own compression stockings Electronic Signature(s) Signed: 02/05/2015 4:56:50 PM By: Regan Lemming BSN, RN Entered By: Regan Lemming on 02/05/2015 11:37:39 [name] ([id number]) -------------------------------------------------------------------------------- Wound Assessment Details Patient Name: [name] Date of Service: 02/05/2015 11:30 AM Medical Record Number: [id number] Patient Account Number: [id number] Date of Birth/Sex: [birth year] (50 y.o. Male) Treating RN: Baruch Gouty, RN, BSN, [place] Primary Care Physician: [name], Rip Harbour Other Clinician: Referring Physician: [name], MELINDA Treating Physician/Extender: Frann Rider in Treatment: 5 Wound Status Wound Number: 3 Primary Venous Leg Ulcer Etiology: Wound Location: Right Lower Leg - Medial Wound Open Wounding Event: Gradually Appeared Status: Date Acquired: 12/13/2014 Comorbid Lymphedema, Congestive Heart Weeks Of Treatment: 5 History: Failure, Hypertension, Type II Clustered Wound: No Diabetes, Gout Photos Photo Uploaded By: Regan Lemming on 02/05/2015 16:36:39 Wound Measurements Length: (cm) 1.5 Width: (cm) 1 Depth: (cm) 1.5 Area: (cm) 1.178 Volume: (cm) 1.767 % Reduction in Area: 37.5% % Reduction in Volume: -368.7% Epithelialization: None Tunneling: No Undermining: No Wound Description Full Thickness Without Classification: Exposed Support Structures Diabetic Severity Grade 1 (Wagner): INOCENCIO, ROY ([id number]) Foul Odor After Cleansing: No Wound Margin: Flat and Intact Exudate Amount: Large Exudate Type: Serous Exudate Color: amber Wound Bed Granulation Amount: Medium (34-66%) Exposed Structure Granulation Quality: Red, Pink Fascia Exposed: No Necrotic Amount: Medium (34-66%) Fat Layer Exposed: No Necrotic Quality: Adherent Slough Tendon Exposed: No Muscle Exposed: No Joint Exposed: No Bone Exposed: No Limited  to Skin Breakdown Periwound Skin Texture Texture Color No Abnormalities Noted: No No Abnormalities Noted: No Callus: No Atrophie Blanche: No Crepitus: No Cyanosis: No Excoriation: No Ecchymosis: No Fluctuance: No Erythema: No Friable: No Hemosiderin Staining: No Induration: No Mottled: No Localized Edema: Yes Pallor: No Rash: No Rubor: No Scarring: No Temperature / Pain Moisture Tenderness on Palpation: Yes No Abnormalities Noted: No Dry / Scaly: No Maceration: No Moist: Yes Wound Preparation Ulcer Cleansing: Other: soap and water, Topical Anesthetic Applied: Other: lidocaine 4%, Treatment Notes Wound #3 (Right, Medial Lower Leg) 1. Cleansed with: Clean wound with Normal Saline 3. Peri-wound Care: Skin Prep 4. Dressing Applied: Aquacel Ag 5. Secondary Dressing Applied MERDITH, BOYD ([id number]) Bordered Foam Dressing 7. Secured with Patient to wear own  compression stockings Electronic Signature(s) Signed: 02/05/2015 4:56:50 PM By: Regan Lemming BSN, RN Entered By: Regan Lemming on 02/05/2015 11:38:09 [name] ([id number]) -------------------------------------------------------------------------------- Wound Assessment Details Patient Name: [name] Date of Service: 02/05/2015 11:30 AM Medical Record Number: [id number] Patient Account Number: [id number] Date of Birth/Sex: 08/02/1965 (50 y.o. Male) Treating RN: Baruch Gouty, RN, BSN, [place] Primary Care Physician: [name], Rip Harbour Other Clinician: Referring Physician: [name], [place] Treating Physician/Extender: Frann Rider in Treatment: 5 Wound Status Wound Number: 4 Primary Venous Leg Ulcer Etiology: Wound Location: Right Lower Leg - Distal Wound Open Wounding Event: Gradually Appeared Status: Date Acquired: 12/13/2014 Comorbid Lymphedema, Congestive Heart Weeks Of Treatment: 5 History: Failure, Hypertension, Type II Clustered Wound: No Diabetes, Gout Photos Photo Uploaded By:  Regan Lemming on 02/05/2015 16:36:40 Wound Measurements Length: (cm) 1.5 Width: (cm) 1.2 Depth: (cm) 1.5 Area: (cm) 1.414 Volume: (cm) 2.121 % Reduction in Area: 7.7% % Reduction in Volume: -593.1% Epithelialization: None Tunneling: No Undermining: No Wound Description Full Thickness Without Exposed Foul Odor A Classification: Support Structures Diabetic Severity Grade 1 (Wagner): JABIR, DAHLEM ([id number]) fter Cleansing: No Wound Margin: Flat and Intact Exudate Amount: Large Exudate Type: Serosanguineous Exudate Color: red, brown Wound Bed Granulation Amount: Medium (34-66%) Exposed Structure Granulation Quality: Red, Pink Fascia Exposed: No Necrotic Amount: Medium (34-66%) Fat Layer Exposed: No Necrotic Quality: Adherent Slough Tendon Exposed: No Muscle Exposed: No Joint Exposed: No Bone Exposed: No Limited to Skin Breakdown Periwound Skin Texture Texture Color No Abnormalities Noted: No No Abnormalities Noted: No Callus: No Atrophie Blanche: No Crepitus: No Cyanosis: No Excoriation: No Ecchymosis: No Fluctuance: No Erythema: No Friable: No Hemosiderin Staining: No Induration: No Mottled: No Localized Edema: Yes Pallor: No Rash: No Rubor: No Scarring: No Temperature / Pain Moisture Temperature: No Abnormality No Abnormalities Noted: No Tenderness on Palpation: Yes Dry / Scaly: No Maceration: No Moist: Yes Wound Preparation Ulcer Cleansing: Rinsed/Irrigated with Saline Topical Anesthetic Applied: Other: lidocaine 4%, Treatment Notes Wound #4 (Right, Distal Lower Leg) 1. Cleansed with: Clean wound with Normal Saline 3. Peri-wound Care: Skin Prep 4. Dressing Applied: Aquacel Ag 5. Secondary Dressing Applied MATTIE, NOVOSEL ([id number]) Bordered Foam Dressing 7. Secured with Patient to wear own compression stockings Electronic Signature(s) Signed: 02/05/2015 4:56:50 PM By: Regan Lemming BSN, RN Entered By: Regan Lemming on  02/05/2015 11:39:16 [name] ([id number]) -------------------------------------------------------------------------------- Vitals Details Patient Name: [name] Date of Service: 02/05/2015 11:30 AM Medical Record Number: [id number] Patient Account Number: [id number] Date of Birth/Sex: [birth year] (50 y.o. Male) Treating RN: Baruch Gouty, RN, BSN, [place] Primary Care Physician: [name], [place] Other Clinician: Referring Physician: [name], [place] Treating Physician/Extender: Frann Rider in Treatment: 5 Vital Signs Time Taken: 11:33 Temperature (F): 98.1 Height (in): 71 Pulse (bpm): 90 Weight (lbs): 360 Respiratory Rate (breaths/min): 20 Body Mass Index (BMI): 50.2 Blood Pressure (mmHg): 117/98 Reference Range: 80 - 120 mg / dl Electronic Signature(s) Signed: 02/05/2015 4:56:50 PM By: Regan Lemming BSN, RN Entered By: Regan Lemming on 02/05/2015 11:33:35

## 2015-02-06 NOTE — Telephone Encounter (Signed)
Nephology will not be able to refill that rx because he has not seen them since April and has no showed follow up appointments.

## 2015-02-06 NOTE — Telephone Encounter (Signed)
This was indeed on his old med list from [employer], Rxd by Dr. Jeananne Rama; I'll refill AMY -- Please let patient know this medicine can adversely affect his heart;  It does the exact OPPOSITE of his carvedilol and can cause high blood pressure, heart attack, stroke; only use in extreme emergencies, NOT for shortness of breath related to heart problems; he should talk with his cardiologist FIRST before using this inhaler anymore to get clearance from his heart doctor in case his heart condition has changed since Dr. Jeananne Rama last prescribed it

## 2015-02-06 NOTE — Telephone Encounter (Signed)
Chem panel in Care Everywhere from Dec 2016, drawn by heart doctor I am disappointed that patient has not seen his kidney doctor in 9 months I called home number, left message that I will fill this Rx for allopurinol, but expect him to call his kidney doctor and be seen very soon, will want them to monitor and dose this medicine in the future Rx sent

## 2015-02-06 NOTE — Telephone Encounter (Signed)
He'd like a refill on his albuterol inhaler. He states he does not use it often and it normally lasts him a year.

## 2015-02-12 ENCOUNTER — Encounter: Payer: Managed Care, Other (non HMO) | Admitting: Surgery

## 2015-02-12 DIAGNOSIS — E11622 Type 2 diabetes mellitus with other skin ulcer: Secondary | ICD-10-CM | POA: Diagnosis not present

## 2015-02-13 NOTE — Progress Notes (Signed)
RUMI, FLEXER ([id number]) Visit Report for 02/12/2015 Chief Complaint Document Details Patient Name: [name], [name]. Date of Service: 02/12/2015 8:00 AM Medical Record Number: [id number] Patient Account Number: [id number] Date of Birth/Sex: [birth year] (49 y.o. Male) Treating RN: Ahmed Prima Primary Care Physician: [name], [place] Other Clinician: Referring Physician: [name], MELINDA Treating Physician/Extender: Frann Rider in Treatment: 6 Information Obtained from: Patient Chief Complaint Patients presents for treatment of an open diabetic ulcer over the right lower extremity with swelling of both legs for about 1 month. Electronic Signature(s) Signed: 02/12/2015 9:01:29 AM By: Christin Fudge MD, FACS Entered By: Christin Fudge on 02/12/2015 09:01:29 [name] ([id number]) -------------------------------------------------------------------------------- HPI Details Patient Name: [name] Date of Service: 02/12/2015 8:00 AM Medical Record Number: [id number] Patient Account Number: [id number] Date of Birth/Sex: 11/02/1965 (49 y.o. Male) Treating RN: Ahmed Prima Primary Care Physician: [name], [place] Other Clinician: Referring Physician: [name], MELINDA Treating Physician/Extender: Frann Rider in Treatment: 6 History of Present Illness Location: Started getting pustules and boils on his right lower extremity along with swelling and was treated for this Quality: Patient reports experiencing a dull pain to affected area(s). Severity: Patient states wound are getting worse. Duration: Patient has had the wound for < 4 weeks prior to presenting for treatment Timing: Pain in wound is Intermittent (comes and goes Context: The wound appeared gradually over time Modifying Factors: Other treatment(s) tried include: local care and antibiotics and some compression stockings. Associated Signs and Symptoms: Patient reports having difficulty standing for  long periods. HPI Description: 50 year old gentleman who has type 2 diabetes mellitus and morbid obesity has been having cellulitis and abscess of the right lower leg for the last 3 weeks. He has had multiple antibiotics and an IandD and over the last 10 days has been on Septra DS and doxycycline and prior to that Keflex. the patient is a poor historian and does not give a definite timeline but says he's been diagnosed with diabetes mellitus for about 8-10 months and has been on insulin. He does not measure his blood sugars regularly nor does he see his PCP regularly. Also has congestive heart failure for which he sees a cardiologist at [facility] but this is going to change as the cardiologist as retired. He is a smoker and smokes several cigars a day and drinks alcohol occasionally. He has been morbidly obese for several years. 01/08/2015 -- the patient complains about having to use his 3 layer compression wrap and has multiple issues with this none of them being medically oriented. He also continues to smoke, has not yet seen his cardiologist or his PCP and in general has continued to not look after himself. 01/15/2015 -- the patient did not allow [place] to put his 3 layer compression wrap last week and has been using his own compression stockings. Continues to smoke. He did see his cardiologist and they have increased his dose of Lasix, asked him to repeat an echo, lose weight and be compliant with his diabetic control. 01/29/2015 -- he has given up smoking for about the last week. He is unable to keep his appointment on 01/30/2015 with the vascular surgeons and has rescheduled it for next week. 02/12/2015 -- he continues to have a lot of edema and drains from the wounds. However he is not agreeable to wear 3 or 4 layer compression wraps. He wants to wear his 20-30 mm compression stockings only. Electronic Signature(s) Signed: 02/12/2015 9:02:03 AM By: Christin Fudge MD, FACS Entered By:  Con Memos  Jaunita Mikels on 02/12/2015 09:02:02 ARMAND, WILLEMS ([id number]) -------------------------------------------------------------------------------- Physical Exam Details Patient Name: QUADRE, DABDOUB Date of Service: 02/12/2015 8:00 AM Medical Record Number: [id number] Patient Account Number: [id number] Date of Birth/Sex: 1965/10/23 (49 y.o. Male) Treating RN: Ahmed Prima Primary Care Physician: [name], [place] Other Clinician: Referring Physician: [name], MELINDA Treating Physician/Extender: Frann Rider in Treatment: 6 Constitutional . Pulse regular. Respirations normal and unlabored. Afebrile. . Eyes Nonicteric. Reactive to light. Ears, Nose, Mouth, and Throat Lips, teeth, and gums WNL.[name] Kitchen Moist mucosa without lesions. Neck supple and nontender. No palpable supraclavicular or cervical adenopathy. Normal sized without goiter. Respiratory WNL. No retractions.. Cardiovascular Pedal Pulses WNL. he has +2 lymphedema right lower extremity with oozing from the wounds. Lymphatic No adneopathy. No adenopathy. No adenopathy. Musculoskeletal Adexa without tenderness or enlargement.. Digits and nails w/o clubbing, cyanosis, infection, petechiae, ischemia, or inflammatory conditions.. Integumentary (Hair, Skin) No suspicious lesions. No crepitus or fluctuance. No peri-wound warmth or erythema. No masses.[name] Kitchen Psychiatric Judgement and insight Intact.. No evidence of depression, anxiety, or agitation.. Notes the wounds continue to tunnel and have significant drainage because of his lymphedema. There is no debris to be sharply removed. Electronic Signature(s) Signed: 02/12/2015 9:02:55 AM By: Christin Fudge MD, FACS Entered By: Christin Fudge on 02/12/2015 09:02:55 [name] ([id number]) -------------------------------------------------------------------------------- Physician Orders Details Patient Name: [name] Date of Service: 02/12/2015 8:00 AM Medical Record  Number: [id number] Patient Account Number: [id number] Date of Birth/Sex: Sep 10, 1965 (49 y.o. Male) Treating RN: Ahmed Prima Primary Care Physician: [name], [place] Other Clinician: Referring Physician: [name], MELINDA Treating Physician/Extender: Frann Rider in Treatment: 6 Verbal / Phone Orders: Yes ClinicianCarolyne Fiscal, Debi Read Back and Verified: Yes Diagnosis Coding Wound Cleansing Wound #2 Right,Lateral,Superior Lower Leg o Cleanse wound with mild soap and water o May Shower, gently pat wound dry prior to applying new dressing. Wound #3 Right,Medial Lower Leg o Cleanse wound with mild soap and water o May Shower, gently pat wound dry prior to applying new dressing. Wound #4 Right,Distal Lower Leg o Cleanse wound with mild soap and water o May Shower, gently pat wound dry prior to applying new dressing. Anesthetic Wound #2 Right,Lateral,Superior Lower Leg o Topical Lidocaine 4% cream applied to wound bed prior to debridement Wound #3 Right,Medial Lower Leg o Topical Lidocaine 4% cream applied to wound bed prior to debridement Wound #4 Right,Distal Lower Leg o Topical Lidocaine 4% cream applied to wound bed prior to debridement Primary Wound Dressing Wound #2 Right,Lateral,Superior Lower Leg o Aquacel Ag Wound #3 Right,Medial Lower Leg o Aquacel Ag Wound #4 Right,Distal Lower Leg o Aquacel Ag Secondary Dressing Wound #2 Right,Lateral,Superior Lower Leg o Dry Gauze - tape ALEXANDERJAMES, IDA ([id number]) Wound #3 Right,Medial Lower Leg o Dry Gauze - tape Wound #4 Right,Distal Lower Leg o Dry Gauze - tape Dressing Change Frequency Wound #2 Right,Lateral,Superior Lower Leg o Change dressing every other day. Wound #3 Right,Medial Lower Leg o Change dressing every other day. Wound #4 Right,Distal Lower Leg o Change dressing every other day. Follow-up Appointments Wound #2 Right,Lateral,Superior Lower Leg o Return  Appointment in 1 week. Wound #3 Right,Medial Lower Leg o Return Appointment in 1 week. Wound #4 Right,Distal Lower Leg o Return Appointment in 1 week. Edema Control Wound #2 Right,Lateral,Superior Lower Leg o Patient to wear own compression stockings o Elevate legs to the level of the heart and pump ankles as often as possible Wound #3 Right,Medial Lower Leg o Patient to wear own compression stockings o Elevate legs  to the level of the heart and pump ankles as often as possible Wound #4 Right,Distal Lower Leg o Patient to wear own compression stockings o Elevate legs to the level of the heart and pump ankles as often as possible Notes pt refused BFD only wants gauze and tape Electronic Signature(s) Signed: 02/12/2015 3:35:32 PM By: Christin Fudge MD, FACS Signed: 02/12/2015 5:07:36 PM By: Alric Quan Entered By: Alric Quan on 02/12/2015 09:06:06 [name] ([id number]) [name] ([id number]) -------------------------------------------------------------------------------- Problem List Details Patient Name: [name] Date of Service: 02/12/2015 8:00 AM Medical Record Number: [id number] Patient Account Number: [id number] Date of Birth/Sex: 1965-07-04 (49 y.o. Male) Treating RN: Ahmed Prima Primary Care Physician: [name], [place] Other Clinician: Referring Physician: [name], [place] Treating Physician/Extender: Frann Rider in Treatment: 6 Active Problems ICD-10 Encounter Code Description Active Date Diagnosis E11.622 Type 2 diabetes mellitus with other skin ulcer 01/01/2015 Yes E66.01 Morbid (severe) obesity due to excess calories 01/01/2015 Yes L97.212 Non-pressure chronic ulcer of right calf with fat layer 02/05/2015 Yes exposed I89.0 Lymphedema, not elsewhere classified 01/01/2015 Yes I50.40 Unspecified combined systolic (congestive) and diastolic 99991111 Yes (congestive) heart failure F17.218 Nicotine dependence,  cigarettes, with other nicotine- 01/01/2015 Yes induced disorders Inactive Problems Resolved Problems ICD-10 Code Description Active Date Resolved Date L02.415 Cutaneous abscess of right lower limb 01/01/2015 01/01/2015 Electronic Signature(s) Signed: 02/12/2015 9:01:21 AM By: Christin Fudge MD, FACS [name] ([id number]) Entered By: Christin Fudge on 02/12/2015 09:01:21 [name] ([id number]) -------------------------------------------------------------------------------- Progress Note Details Patient Name: [name] Date of Service: 02/12/2015 8:00 AM Medical Record Number: [id number] Patient Account Number: [id number] Date of Birth/Sex: 11/20/1965 (49 y.o. Male) Treating RN: Ahmed Prima Primary Care Physician: [name], [place] Other Clinician: Referring Physician: [name], MELINDA Treating Physician/Extender: Frann Rider in Treatment: 6 Subjective Chief Complaint Information obtained from Patient Patients presents for treatment of an open diabetic ulcer over the right lower extremity with swelling of both legs for about 1 month. History of Present Illness (HPI) The following HPI elements were documented for the patient's wound: Location: Started getting pustules and boils on his right lower extremity along with swelling and was treated for this Quality: Patient reports experiencing a dull pain to affected area(s). Severity: Patient states wound are getting worse. Duration: Patient has had the wound for < 4 weeks prior to presenting for treatment Timing: Pain in wound is Intermittent (comes and goes Context: The wound appeared gradually over time Modifying Factors: Other treatment(s) tried include: local care and antibiotics and some compression stockings. Associated Signs and Symptoms: Patient reports having difficulty standing for long periods. 50 year old gentleman who has type 2 diabetes mellitus and morbid obesity has been having cellulitis  and abscess of the right lower leg for the last 3 weeks. He has had multiple antibiotics and an IandD and over the last 10 days has been on Septra DS and doxycycline and prior to that Keflex. the patient is a poor historian and does not give a definite timeline but says he's been diagnosed with diabetes mellitus for about 8-10 months and has been on insulin. He does not measure his blood sugars regularly nor does he see his PCP regularly. Also has congestive heart failure for which he sees a cardiologist at [facility] but this is going to change as the cardiologist as retired. He is a smoker and smokes several cigars a day and drinks alcohol occasionally. He has been morbidly obese for several years. 01/08/2015 -- the patient complains about having  to use his 3 layer compression wrap and has multiple issues with this none of them being medically oriented. He also continues to smoke, has not yet seen his cardiologist or his PCP and in general has continued to not look after himself. 01/15/2015 -- the patient did not allow [place] to put his 3 layer compression wrap last week and has been using his own compression stockings. Continues to smoke. He did see his cardiologist and they have increased his dose of Lasix, asked him to repeat an echo, lose weight and be compliant with his diabetic control. 01/29/2015 -- he has given up smoking for about the last week. He is unable to keep his appointment on 01/30/2015 with the vascular surgeons and has rescheduled it for next week. 02/12/2015 -- he continues to have a lot of edema and drains from the wounds. However he is not CASHDEN, [place]. ([id number]) agreeable to wear 3 or 4 layer compression wraps. He wants to wear his 20-30 mm compression stockings only. Objective Constitutional Pulse regular. Respirations normal and unlabored. Afebrile. Vitals Time Taken: 8:10 AM, Height: 71 in, Weight: 360 lbs, BMI: 50.2, Temperature: 97.8 F, Pulse: 92 bpm,  Respiratory Rate: 20 breaths/min, Blood Pressure: 167/100 mmHg. General Notes: pt states his BP is high because he is irritated Eyes Nonicteric. Reactive to light. Ears, Nose, Mouth, and Throat Lips, teeth, and gums WNL.[name] Kitchen Moist mucosa without lesions. Neck supple and nontender. No palpable supraclavicular or cervical adenopathy. Normal sized without goiter. Respiratory WNL. No retractions.. Cardiovascular Pedal Pulses WNL. he has +2 lymphedema right lower extremity with oozing from the wounds. Lymphatic No adneopathy. No adenopathy. No adenopathy. Musculoskeletal Adexa without tenderness or enlargement.. Digits and nails w/o clubbing, cyanosis, infection, petechiae, ischemia, or inflammatory conditions.[name] Kitchen Psychiatric Judgement and insight Intact.. No evidence of depression, anxiety, or agitation.. General Notes: the wounds continue to tunnel and have significant drainage because of his lymphedema. There is no debris to be sharply removed. Integumentary (Hair, Skin) No suspicious lesions. No crepitus or fluctuance. No peri-wound warmth or erythema. No masses.[name] Kitchen JASHON, [place] ([id number]) Wound #2 status is Open. Original cause of wound was Gradually Appeared. The wound is located on the Right,Lateral,Superior Lower Leg. The wound measures 0.8cm length x 0.6cm width x 1.7cm depth; 0.377cm^2 area and 0.641cm^3 volume. The wound is limited to skin breakdown. There is no tunneling or undermining noted. There is a large amount of serous drainage noted. The wound margin is flat and intact. There is large (67-100%) pink granulation within the wound bed. There is a small (1-33%) amount of necrotic tissue within the wound bed including Adherent Slough. The periwound skin appearance exhibited: Localized Edema, Moist. Periwound temperature was noted as No Abnormality. The periwound has tenderness on palpation. Wound #3 status is Open. Original cause of wound was Gradually Appeared. The wound is  located on the Right,Medial Lower Leg. The wound measures 1.8cm length x 1.2cm width x 1.1cm depth; 1.696cm^2 area and 1.866cm^3 volume. The wound is limited to skin breakdown. There is no tunneling or undermining noted. There is a large amount of serous drainage noted. The wound margin is flat and intact. There is medium (34-66%) red, pink granulation within the wound bed. There is a medium (34-66%) amount of necrotic tissue within the wound bed including Adherent Slough. The periwound skin appearance exhibited: Localized Edema, Moist. The periwound skin appearance did not exhibit: Callus, Crepitus, Excoriation, Fluctuance, Friable, Induration, Rash, Scarring, Dry/Scaly, Maceration, Atrophie Blanche, Cyanosis, Ecchymosis, Hemosiderin Staining, Mottled, Pallor, Rubor, Erythema.  The periwound has tenderness on palpation. Wound #4 status is Open. Original cause of wound was Gradually Appeared. The wound is located on the Right,Distal Lower Leg. The wound measures 1.5cm length x 1.3cm width x 1cm depth; 1.532cm^2 area and 1.532cm^3 volume. The wound is limited to skin breakdown. There is no tunneling or undermining noted. There is a large amount of serous drainage noted. The wound margin is flat and intact. There is medium (34-66%) red, pink granulation within the wound bed. There is a medium (34-66%) amount of necrotic tissue within the wound bed including Adherent Slough. The periwound skin appearance exhibited: Localized Edema, Moist. The periwound skin appearance did not exhibit: Callus, Crepitus, Excoriation, Fluctuance, Friable, Induration, Rash, Scarring, Dry/Scaly, Maceration, Atrophie Blanche, Cyanosis, Ecchymosis, Hemosiderin Staining, Mottled, Pallor, Rubor, Erythema. Periwound temperature was noted as No Abnormality. The periwound has tenderness on palpation. Assessment Active Problems ICD-10 E11.622 - Type 2 diabetes mellitus with other skin ulcer E66.01 - Morbid (severe) obesity  due to excess calories L97.212 - Non-pressure chronic ulcer of right calf with fat layer exposed I89.0 - Lymphedema, not elsewhere classified I50.40 - Unspecified combined systolic (congestive) and diastolic (congestive) heart failure F17.218 - Nicotine dependence, cigarettes, with other nicotine-induced disorders INFINITE, [place] ([id number]) Plan Wound Cleansing: Wound #2 Right,Lateral,Superior Lower Leg: Cleanse wound with mild soap and water May Shower, gently pat wound dry prior to applying new dressing. Wound #3 Right,Medial Lower Leg: Cleanse wound with mild soap and water May Shower, gently pat wound dry prior to applying new dressing. Wound #4 Right,Distal Lower Leg: Cleanse wound with mild soap and water May Shower, gently pat wound dry prior to applying new dressing. Anesthetic: Wound #2 Right,Lateral,Superior Lower Leg: Topical Lidocaine 4% cream applied to wound bed prior to debridement Wound #3 Right,Medial Lower Leg: Topical Lidocaine 4% cream applied to wound bed prior to debridement Wound #4 Right,Distal Lower Leg: Topical Lidocaine 4% cream applied to wound bed prior to debridement Primary Wound Dressing: Wound #2 Right,Lateral,Superior Lower Leg: Aquacel Ag Wound #3 Right,Medial Lower Leg: Aquacel Ag Wound #4 Right,Distal Lower Leg: Aquacel Ag Secondary Dressing: Wound #2 Right,Lateral,Superior Lower Leg: Dry Gauze - tape Wound #3 Right,Medial Lower Leg: Dry Gauze - tape Wound #4 Right,Distal Lower Leg: Dry Gauze - tape Dressing Change Frequency: Wound #2 Right,Lateral,Superior Lower Leg: Change dressing every other day. Wound #3 Right,Medial Lower Leg: Change dressing every other day. Wound #4 Right,Distal Lower Leg: Change dressing every other day. Follow-up Appointments: Wound #2 Right,Lateral,Superior Lower Leg: Return Appointment in 1 week. Wound #3 Right,Medial Lower Leg: Return Appointment in 1 week. Wound #4 Right,Distal Lower Leg: Return  Appointment in 1 week. Edema Control: Wound #2 Right,Lateral,Superior Lower Leg: Patient to wear own compression stockings DEMONTEZ, [place]ER[place]SE. ([id number]) Elevate legs to the level of the heart and pump ankles as often as possible Wound #3 Right,Medial Lower Leg: Patient to wear own compression stockings Elevate legs to the level of the heart and pump ankles as often as possible Wound #4 Right,Distal Lower Leg: Patient to wear own compression stockings Elevate legs to the level of the heart and pump ankles as often as possible General Notes: pt refused BFD only wants gauze and tape I have spent some time discussing his wound care and have asked them to pack it with strips of silver alginate down into the depths. As noted before he will not use compression wraps but is using his stockings for edema control. He is still off smoking. Electronic Signature(s) Signed: 02/12/2015 3:37:20  PM By: Christin Fudge MD, FACS Previous Signature: 02/12/2015 9:03:41 AM Version By: Christin Fudge MD, FACS Entered By: Christin Fudge on 02/12/2015 15:37:20 [name] ([id number]) -------------------------------------------------------------------------------- SuperBill Details Patient Name: [name] Date of Service: 02/12/2015 Medical Record Number: [id number] Patient Account Number: [id number] Date of Birth/Sex: 15-Jun-1965 (49 y.o. Male) Treating RN: Ahmed Prima Primary Care Physician: [name], [place] Other Clinician: Referring Physician: [name], [place] Treating Physician/Extender: Frann Rider in Treatment: 6 Diagnosis Coding ICD-10 Codes Code Description E11.622 Type 2 diabetes mellitus with other skin ulcer E66.01 Morbid (severe) obesity due to excess calories L97.212 Non-pressure chronic ulcer of right calf with fat layer exposed I89.0 Lymphedema, not elsewhere classified I50.40 Unspecified combined systolic (congestive) and diastolic (congestive) heart failure F17.218  Nicotine dependence, cigarettes, with other nicotine-induced disorders Facility Procedures CPT4 Code: [id number] Description: 99213 - WOUND CARE VISIT-LEV 3 EST PT Modifier: Quantity: 1 Physician Procedures CPT4 Code: [id number] Description: 99213 - WC PHYS LEVEL 3 - EST PT ICD-10 Description Diagnosis E11.622 Type 2 diabetes mellitus with other skin ulcer E66.01 Morbid (severe) obesity due to excess calories L97.212 Non-pressure chronic ulcer of right calf with fat I89.0  Lymphedema, not elsewhere classified Modifier: layer exposed Quantity: 1 Electronic Signature(s) Signed: 02/12/2015 3:35:32 PM By: Christin Fudge MD, FACS Signed: 02/12/2015 5:07:36 PM By: Alric Quan Previous Signature: 02/12/2015 9:03:56 AM Version By: Christin Fudge MD, FACS Entered By: Alric Quan on 02/12/2015 11:53:04

## 2015-02-13 NOTE — Progress Notes (Signed)
[name], [name] ([id number]) Visit Report for 02/12/2015 Arrival Information Details Patient Name: [name]. Date of Service: 02/12/2015 8:00 AM Medical Record Number: [id number] Patient Account Number: [id number] Date of Birth/Sex: [birth year] (50 y.o. Male) Treating RN: Ahmed Prima Primary Care Physician: [name], [place] Other Clinician: Referring Physician: [name], [place] Treating Physician/Extender: Frann Rider in Treatment: 6 Visit Information History Since Last Visit All ordered tests and consults were completed: No Patient Arrived: Ambulatory Added or deleted any medications: No Arrival Time: 08:08 Any new allergies or adverse reactions: No Accompanied By: self Had a fall or experienced change in No Transfer Assistance: None activities of daily living that may affect Patient Identification Verified: Yes risk of falls: Secondary Verification Process Yes Signs or symptoms of abuse/neglect since last No Completed: visito Patient Requires Transmission-Based No Hospitalized since last visit: No Precautions: Pain Present Now: No Patient Has Alerts: Yes Patient Alerts: DM II Electronic Signature(s) Signed: 02/12/2015 5:07:36 PM By: Alric Quan Entered By: Alric Quan on 02/12/2015 08:10:06 [name] ([id number]) -------------------------------------------------------------------------------- Clinic Level of Care Assessment Details Patient Name: [name] Date of Service: 02/12/2015 8:00 AM Medical Record Number: [id number] Patient Account Number: [id number] Date of Birth/Sex: 06/07/1965 (50 y.o. Male) Treating RN: Ahmed Prima Primary Care Physician: [name], [place] Other Clinician: Referring Physician: [name], MELINDA Treating Physician/Extender: Frann Rider in Treatment: 6 Clinic Level of Care Assessment Items TOOL 4 Quantity Score '[]'  - Use when only an EandM is performed on FOLLOW-UP visit 0 ASSESSMENTS - Nursing  Assessment / Reassessment '[]'  - Reassessment of Co-morbidities (includes updates in patient status) 0 X - Reassessment of Adherence to Treatment Plan 1 5 ASSESSMENTS - Wound and Skin Assessment / Reassessment '[]'  - Simple Wound Assessment / Reassessment - one wound 0 X - Complex Wound Assessment / Reassessment - multiple wounds 3 5 '[]'  - Dermatologic / Skin Assessment (not related to wound area) 0 ASSESSMENTS - Focused Assessment '[]'  - Circumferential Edema Measurements - multi extremities 0 '[]'  - Nutritional Assessment / Counseling / Intervention 0 '[]'  - Lower Extremity Assessment (monofilament, tuning fork, pulses) 0 '[]'  - Peripheral Arterial Disease Assessment (using hand held doppler) 0 ASSESSMENTS - Ostomy and/or Continence Assessment and Care '[]'  - Incontinence Assessment and Management 0 '[]'  - Ostomy Care Assessment and Management (repouching, etc.) 0 PROCESS - Coordination of Care X - Simple Patient / Family Education for ongoing care 1 15 '[]'  - Complex (extensive) Patient / Family Education for ongoing care 0 '[]'  - Staff obtains Programmer, systems, Records, Test Results / Process Orders 0 '[]'  - Staff telephones HHA, Nursing Homes / Clarify orders / etc 0 '[]'  - Routine Transfer to another Facility (non-emergent condition) 0 [name], [name]. ([id number]) '[]'  - Routine Hospital Admission (non-emergent condition) 0 '[]'  - New Admissions / Biomedical engineer / Ordering NPWT, Apligraf, etc. 0 '[]'  - Emergency Hospital Admission (emergent condition) 0 X - Simple Discharge Coordination 1 10 '[]'  - Complex (extensive) Discharge Coordination 0 PROCESS - Special Needs '[]'  - Pediatric / Minor Patient Management 0 '[]'  - Isolation Patient Management 0 '[]'  - Hearing / Language / Visual special needs 0 '[]'  - Assessment of Community assistance (transportation, D/C planning, etc.) 0 '[]'  - Additional assistance / Altered mentation 0 '[]'  - Support Surface(s) Assessment (bed, cushion, seat, etc.) 0 INTERVENTIONS - Wound  Cleansing / Measurement '[]'  - Simple Wound Cleansing - one wound 0 X - Complex Wound Cleansing - multiple wounds 3 5 X - Wound Imaging (photographs - any number of wounds) 1  5 '[]'  - Wound Tracing (instead of photographs) 0 '[]'  - Simple Wound Measurement - one wound 0 X - Complex Wound Measurement - multiple wounds 3 5 INTERVENTIONS - Wound Dressings X - Small Wound Dressing one or multiple wounds 3 10 '[]'  - Medium Wound Dressing one or multiple wounds 0 '[]'  - Large Wound Dressing one or multiple wounds 0 '[]'  - Application of Medications - topical 0 '[]'  - Application of Medications - injection 0 INTERVENTIONS - Miscellaneous '[]'  - External ear exam 0 [name], [name] ([id number]) '[]'  - Specimen Collection (cultures, biopsies, blood, body fluids, etc.) 0 '[]'  - Specimen(s) / Culture(s) sent or taken to Lab for analysis 0 '[]'  - Patient Transfer (multiple staff / Harrel Lemon Lift / Similar devices) 0 '[]'  - Simple Staple / Suture removal (25 or less) 0 '[]'  - Complex Staple / Suture removal (26 or more) 0 '[]'  - Hypo / Hyperglycemic Management (close monitor of Blood Glucose) 0 '[]'  - Ankle / Brachial Index (ABI) - do not check if billed separately 0 X - Vital Signs 1 5 Has the patient been seen at the hospital within the last three years: Yes Total Score: 115 Level Of Care: New/Established - Level 3 Electronic Signature(s) Signed: 02/12/2015 5:07:36 PM By: Alric Quan Entered By: Alric Quan on 02/12/2015 11:52:49 [name] ([id number]) -------------------------------------------------------------------------------- Encounter Discharge Information Details Patient Name: [name] Date of Service: 02/12/2015 8:00 AM Medical Record Number: [id number] Patient Account Number: [id number] Date of Birth/Sex: 02-12-1965 (50 y.o. Male) Treating RN: Ahmed Prima Primary Care Physician: [name], [place] Other Clinician: Referring Physician: [name], MELINDA Treating Physician/Extender: Frann Rider in Treatment: 6 Encounter Discharge Information Items Discharge Pain Level: 0 Discharge Condition: Stable Ambulatory Status: Ambulatory Discharge Destination: Home Transportation: Private Auto Accompanied By: self Schedule Follow-up Appointment: Yes Medication Reconciliation completed and provided to Patient/Care Yes Anwita Mencer: Provided on Clinical Summary of Care: 02/12/2015 Form Type Recipient Paper Patient RT Electronic Signature(s) Signed: 02/12/2015 9:03:30 AM By: Ruthine Dose Entered By: Ruthine Dose on 02/12/2015 09:03:29 [name] ([id number]) -------------------------------------------------------------------------------- Lower Extremity Assessment Details Patient Name: [name] Date of Service: 02/12/2015 8:00 AM Medical Record Number: [id number] Patient Account Number: [id number] Date of Birth/Sex: 09-Apr-1965 (50 y.o. Male) Treating RN: Ahmed Prima Primary Care Physician: [name], [place] Other Clinician: Referring Physician: [name], MELINDA Treating Physician/Extender: Frann Rider in Treatment: 6 Edema Assessment Assessed: [Left: No] [Right: No] Edema: [Left: Ye] [Right: s] Calf Left: Right: Point of Measurement: 34 cm From Medial Instep cm 49.5 cm Ankle Left: Right: Point of Measurement: 9 cm From Medial Instep cm 30.9 cm Vascular Assessment Pulses: Posterior Tibial Dorsalis Pedis Palpable: [Right:Yes] Extremity colors, hair growth, and conditions: Extremity Color: [Right:Normal] Hair Growth on Extremity: [Right:No] Temperature of Extremity: [Right:Warm] Capillary Refill: [Right:< 3 seconds] Toe Nail Assessment Left: Right: Thick: No Discolored: No Deformed: No Improper Length and Hygiene: No Electronic Signature(s) Signed: 02/12/2015 5:07:36 PM By: Alric Quan Entered By: Alric Quan on 02/12/2015 08:17:10 [name]  ([id number]) -------------------------------------------------------------------------------- Multi Wound Chart Details Patient Name: [name] Date of Service: 02/12/2015 8:00 AM Medical Record Number: [id number] Patient Account Number: [id number] Date of Birth/Sex: [birth year] (50 y.o. Male) Treating RN: Ahmed Prima Primary Care Physician: [name], [place] Other Clinician: Referring Physician: [name], MELINDA Treating Physician/Extender: Frann Rider in Treatment: 6 Vital Signs Height(in): 71 Pulse(bpm): 92 Weight(lbs): 360 Blood Pressure 167/100 (mmHg): Body Mass Index(BMI): 50 Temperature(F): 97.8 Respiratory Rate 20 (breaths/min): Photos: [2:No Photos] [3:No Photos] [4:No Photos] Wound Location: [  2:Right Lower Leg - Lateral, Right Lower Leg - Medial Right Lower Leg - Distal Superior] Wounding Event: [2:Gradually Appeared] [3:Gradually Appeared] [4:Gradually Appeared] Primary Etiology: [2:Venous Leg Ulcer] [3:Venous Leg Ulcer] [4:Venous Leg Ulcer] Comorbid History: [2:Lymphedema, Congestive Lymphedema, Congestive Lymphedema, Congestive Heart Failure, Hypertension, Type II Diabetes, Gout] [3:Heart Failure, Hypertension, Type II Diabetes, Gout] [4:Heart Failure, Hypertension, Type II Diabetes, Gout] Date Acquired: [2:12/13/2014] [3:12/13/2014] [4:12/13/2014] Weeks of Treatment: [2:6] [3:6] [4:6] Wound Status: [2:Open] [3:Open] [4:Open] Measurements L x W x D 0.8x0.6x1.7 [3:1.8x1.2x1.1] [4:1.5x1.3x1] (cm) Area (cm) : [2:0.377] [3:1.696] [4:1.532] Volume (cm) : [2:0.641] [3:1.866] [4:1.532] % Reduction in Area: [2:52.00%] [3:10.00%] [4:0.00%] % Reduction in Volume: -308.30% [3:-395.00%] [4:-400.70%] Classification: [2:Full Thickness Without Exposed Support Structures] [3:Full Thickness Without Exposed Support Structures] [4:Full Thickness Without Exposed Support Structures] HBO Classification: [2:Grade 1] [3:Grade 1] [4:Grade 1] Exudate Amount: [2:Large]  [3:Large] [4:Large] Exudate Type: [2:Serous] [3:Serous] [4:Serous] Exudate Color: [2:amber] [3:amber] [4:amber] Wound Margin: [2:Flat and Intact] [3:Flat and Intact] [4:Flat and Intact] Granulation Amount: [2:Large (67-100%)] [3:Medium (34-66%)] [4:Medium (34-66%)] Granulation Quality: [2:Pink] [3:Red, Pink] [4:Red, Pink] Necrotic Amount: [2:Small (1-33%)] [3:Medium (34-66%)] [4:Medium (34-66%)] Exposed Structures: [name], [name] ([id number]) Fascia: No Fascia: No Fascia: No Fat: No Fat: No Fat: No Tendon: No Tendon: No Tendon: No Muscle: No Muscle: No Muscle: No Joint: No Joint: No Joint: No Bone: No Bone: No Bone: No Limited to Skin Limited to Skin Limited to Skin Breakdown Breakdown Breakdown Epithelialization: None None None Periwound Skin Texture: Edema: Yes Edema: Yes Edema: Yes Excoriation: No Excoriation: No Induration: No Induration: No Callus: No Callus: No Crepitus: No Crepitus: No Fluctuance: No Fluctuance: No Friable: No Friable: No Rash: No Rash: No Scarring: No Scarring: No Periwound Skin Moist: Yes Moist: Yes Moist: Yes Moisture: Maceration: No Maceration: No Dry/Scaly: No Dry/Scaly: No Periwound Skin Color: No Abnormalities Noted Atrophie Blanche: No Atrophie Blanche: No Cyanosis: No Cyanosis: No Ecchymosis: No Ecchymosis: No Erythema: No Erythema: No Hemosiderin Staining: No Hemosiderin Staining: No Mottled: No Mottled: No Pallor: No Pallor: No Rubor: No Rubor: No Temperature: No Abnormality N/A No Abnormality Tenderness on Yes Yes Yes Palpation: Wound Preparation: Ulcer Cleansing: Other: Ulcer Cleansing: Other: Ulcer Cleansing: soap and water soap and water Rinsed/Irrigated with Saline Topical Anesthetic Topical Anesthetic Applied: Other: lidocaine Applied: Other: lidocaine Topical Anesthetic 4% 4% Applied: Other: lidocaine 4% Treatment Notes Electronic Signature(s) Signed: 02/12/2015 5:07:36 PM By: Alric Quan Entered By: Alric Quan on 02/12/2015 08:32:26 [name] ([id number]) -------------------------------------------------------------------------------- [place] Details Patient Name: [name] Date of Service: 02/12/2015 8:00 AM Medical Record Number: [id number] Patient Account Number: [id number] Date of Birth/Sex: 1965/11/07 (50 y.o. Male) Treating RN: Ahmed Prima Primary Care Physician: [name], [place] Other Clinician: Referring Physician: [name], MELINDA Treating Physician/Extender: Frann Rider in Treatment: 6 Active Inactive Abuse / Safety / Falls / Self Care Management Nursing Diagnoses: Potential for falls Goals: Patient will remain injury free Date Initiated: 01/01/2015 Goal Status: Active Patient/caregiver will verbalize/demonstrate measures taken to prevent injury and/or falls Date Initiated: 01/01/2015 Goal Status: Active Interventions: Assess fall risk on admission and as needed Notes: Nutrition Nursing Diagnoses: Imbalanced nutrition Goals: Patient/caregiver agrees to and verbalizes understanding of need to use nutritional supplements and/or vitamins as prescribed Date Initiated: 01/01/2015 Goal Status: Active Patient/caregiver verbalizes understanding of need to maintain therapeutic glucose control per primary care physician Date Initiated: 01/01/2015 Goal Status: Active Patient/caregiver will maintain therapeutic glucose control Date Initiated: 01/01/2015 Goal Status: Active Interventions: [name], [name] ([id number]) Provide education on elevated  blood sugars and impact on wound healing Provide education on nutrition Treatment Activities: Education provided on Nutrition : 01/08/2015 Notes: Orientation to the Wound Care Program Nursing Diagnoses: Knowledge deficit related to the wound healing center program Goals: Patient/caregiver will verbalize understanding of the [place]  Program Date Initiated: 01/01/2015 Goal Status: Active Interventions: Provide education on orientation to the wound center Notes: Pain, Acute or Chronic Nursing Diagnoses: Pain, acute or chronic: actual or potential Goals: Patient will verbalize adequate pain control and receive pain control interventions during procedures as needed Date Initiated: 01/01/2015 Goal Status: Active Patient/caregiver will verbalize adequate pain control between visits Date Initiated: 01/01/2015 Goal Status: Active Patient/caregiver will verbalize comfort level met Date Initiated: 01/01/2015 Goal Status: Active Interventions: Assess comfort goal upon admission Complete pain assessment as per visit requirements Notes: Wound/Skin Impairment [name], [name] ([id number]) Nursing Diagnoses: Impaired tissue integrity Knowledge deficit related to smoking impact on wound healing Knowledge deficit related to ulceration/compromised skin integrity Goals: Patient will demonstrate a reduced rate of smoking or cessation of smoking Date Initiated: 01/01/2015 Goal Status: Active Patient/caregiver will verbalize understanding of skin care regimen Date Initiated: 01/01/2015 Goal Status: Active Ulcer/skin breakdown will have a volume reduction of 30% by week 4 Date Initiated: 01/01/2015 Goal Status: Active Ulcer/skin breakdown will have a volume reduction of 50% by week 8 Date Initiated: 01/01/2015 Goal Status: Active Ulcer/skin breakdown will have a volume reduction of 80% by week 12 Date Initiated: 01/01/2015 Goal Status: Active Interventions: Assess patient/caregiver ability to obtain necessary supplies Assess patient/caregiver ability to perform ulcer/skin care regimen upon admission and as needed Notes: Electronic Signature(s) Signed: 02/12/2015 5:07:36 PM By: Alric Quan Entered By: Alric Quan on 02/12/2015 08:32:18 [name]  ([id number]) -------------------------------------------------------------------------------- Pain Assessment Details Patient Name: [name] Date of Service: 02/12/2015 8:00 AM Medical Record Number: [id number] Patient Account Number: [id number] Date of Birth/Sex: [birth year] (50 y.o. Male) Treating RN: Ahmed Prima Primary Care Physician: [name], [place] Other Clinician: Referring Physician: [name], [place] Treating Physician/Extender: Frann Rider in Treatment: 6 Active Problems Location of Pain Severity and Description of Pain Patient Has Paino No Site Locations Pain Management and Medication Current Pain Management: Electronic Signature(s) Signed: 02/12/2015 5:07:36 PM By: Alric Quan Entered By: Alric Quan on 02/12/2015 08:10:12 [name] ([id number]) -------------------------------------------------------------------------------- Patient/Caregiver Education Details Patient Name: [name] Date of Service: 02/12/2015 8:00 AM Medical Record Number: [id number] Patient Account Number: [id number] Date of Birth/Gender: 1965-12-11 (50 y.o. Male) Treating RN: Ahmed Prima Primary Care Physician: [name], [place] Other Clinician: Referring Physician: [name], [place] Treating Physician/Extender: Frann Rider in Treatment: 6 Education Assessment Education Provided To: Patient Education Topics Provided Wound/Skin Impairment: Handouts: Other: change dressing as ordered Methods: Demonstration, Explain/Verbal Responses: State content correctly Electronic Signature(s) Signed: 02/12/2015 5:07:36 PM By: Alric Quan Entered By: Alric Quan on 02/12/2015 08:48:09 [name] ([id number]) -------------------------------------------------------------------------------- Wound Assessment Details Patient Name: [name] Date of Service: 02/12/2015 8:00 AM Medical Record Number: [id number] Patient Account Number:  [id number] Date of Birth/Sex: November 01, 1965 (50 y.o. Male) Treating RN: Ahmed Prima Primary Care Physician: [name], [place] Other Clinician: Referring Physician: [name], MELINDA Treating Physician/Extender: Frann Rider in Treatment: 6 Wound Status Wound Number: 2 Primary Venous Leg Ulcer Etiology: Wound Location: Right Lower Leg - Lateral, Superior Wound Open Status: Wounding Event: Gradually Appeared Comorbid Lymphedema, Congestive Heart Date Acquired: 12/13/2014 History: Failure, Hypertension, Type II Weeks Of Treatment: 6 Diabetes, Gout Clustered Wound: No Photos Photo Uploaded By: Alric Quan on 02/12/2015 16:43:16 Wound  Measurements Length: (cm) 0.8 % Reduction i Width: (cm) 0.6 % Reduction i Depth: (cm) 1.7 Epithelializa Area: (cm) 0.377 Tunneling: Volume: (cm) 0.641 Undermining: n Area: 52% n Volume: -308.3% tion: None No No Wound Description Full Thickness Without Classification: Exposed Support Structures Diabetic Severity Grade 1 (Wagner): Wound Margin: Flat and Intact Exudate Amount: Large Exudate Type: Serous Exudate Color: amber Foul Odor After Cleansing: No Wound Bed Granulation Amount: Large (67-100%) Exposed Structure [name], [name] ([id number]) Granulation Quality: Pink Fascia Exposed: No Necrotic Amount: Small (1-33%) Fat Layer Exposed: No Necrotic Quality: Adherent Slough Tendon Exposed: No Muscle Exposed: No Joint Exposed: No Bone Exposed: No Limited to Skin Breakdown Periwound Skin Texture Texture Color No Abnormalities Noted: No No Abnormalities Noted: No Localized Edema: Yes Temperature / Pain Moisture Temperature: No Abnormality No Abnormalities Noted: No Tenderness on Palpation: Yes Moist: Yes Wound Preparation Ulcer Cleansing: Other: soap and water, Topical Anesthetic Applied: Other: lidocaine 4%, Treatment Notes Wound #2 (Right, Lateral, Superior Lower Leg) 1. Cleansed with: Clean wound with  Normal Saline 2. Anesthetic Topical Lidocaine 4% cream to wound bed prior to debridement 3. Peri-wound Care: Skin Prep 4. Dressing Applied: Aquacel Ag 5. Secondary Dressing Applied Dry Gauze 7. Secured with Recruitment consultant) Signed: 02/12/2015 5:07:36 PM By: Alric Quan Entered By: Alric Quan on 02/12/2015 08:31:50 [name] ([id number]) -------------------------------------------------------------------------------- Wound Assessment Details Patient Name: [name] Date of Service: 02/12/2015 8:00 AM Medical Record Number: [id number] Patient Account Number: [id number] Date of Birth/Sex: 05-07-1965 (50 y.o. Male) Treating RN: Ahmed Prima Primary Care Physician: [name], [place] Other Clinician: Referring Physician: [name], [place] Treating Physician/Extender: Frann Rider in Treatment: 6 Wound Status Wound Number: 3 Primary Venous Leg Ulcer Etiology: Wound Location: Right Lower Leg - Medial Wound Open Wounding Event: Gradually Appeared Status: Date Acquired: 12/13/2014 Comorbid Lymphedema, Congestive Heart Weeks Of Treatment: 6 History: Failure, Hypertension, Type II Clustered Wound: No Diabetes, Gout Photos Photo Uploaded By: Alric Quan on 02/12/2015 16:43:42 Wound Measurements Length: (cm) 1.8 % Reduction i Width: (cm) 1.2 % Reduction i Depth: (cm) 1.1 Epithelializa Area: (cm) 1.696 Tunneling: Volume: (cm) 1.866 Undermining: n Area: 10% n Volume: -395% tion: None No No Wound Description Full Thickness Without Classification: Exposed Support Structures Diabetic Severity Grade 1 (Wagner): Wound Margin: Flat and Intact Exudate Amount: Large Exudate Type: Serous Exudate Color: amber Foul Odor After Cleansing: No Wound Bed Granulation Amount: Medium (34-66%) Exposed Structure [name], [place]. ([id number]) Granulation Quality: Red, Pink Fascia Exposed: No Necrotic Amount: Medium (34-66%) Fat Layer  Exposed: No Necrotic Quality: Adherent Slough Tendon Exposed: No Muscle Exposed: No Joint Exposed: No Bone Exposed: No Limited to Skin Breakdown Periwound Skin Texture Texture Color No Abnormalities Noted: No No Abnormalities Noted: No Callus: No Atrophie Blanche: No Crepitus: No Cyanosis: No Excoriation: No Ecchymosis: No Fluctuance: No Erythema: No Friable: No Hemosiderin Staining: No Induration: No Mottled: No Localized Edema: Yes Pallor: No Rash: No Rubor: No Scarring: No Temperature / Pain Moisture Tenderness on Palpation: Yes No Abnormalities Noted: No Dry / Scaly: No Maceration: No Moist: Yes Wound Preparation Ulcer Cleansing: Other: soap and water, Topical Anesthetic Applied: Other: lidocaine 4%, Treatment Notes Wound #3 (Right, Medial Lower Leg) 1. Cleansed with: Clean wound with Normal Saline 2. Anesthetic Topical Lidocaine 4% cream to wound bed prior to debridement 3. Peri-wound Care: Skin Prep 4. Dressing Applied: Aquacel Ag 5. Secondary Dressing Applied Dry Gauze 7. Secured with Recruitment consultant) Signed: 02/12/2015 5:07:36 PM By: Williemae Natter ([id number])  Entered By: Alric Quan on 02/12/2015 08:31:14 [name] ([id number]) -------------------------------------------------------------------------------- Wound Assessment Details Patient Name: [name] Date of Service: 02/12/2015 8:00 AM Medical Record Number: [id number] Patient Account Number: [id number] Date of Birth/Sex: February 01, 1965 (50 y.o. Male) Treating RN: Ahmed Prima Primary Care Physician: [name], [place] Other Clinician: Referring Physician: [name], MELINDA Treating Physician/Extender: Frann Rider in Treatment: 6 Wound Status Wound Number: 4 Primary Venous Leg Ulcer Etiology: Wound Location: Right Lower Leg - Distal Wound Open Wounding Event: Gradually Appeared Status: Date Acquired: 12/13/2014 Comorbid  Lymphedema, Congestive Heart Weeks Of Treatment: 6 History: Failure, Hypertension, Type II Clustered Wound: No Diabetes, Gout Photos Photo Uploaded By: Alric Quan on 02/12/2015 16:43:43 Wound Measurements Length: (cm) 1.5 % Reduction i Width: (cm) 1.3 % Reduction i Depth: (cm) 1 Epithelializa Area: (cm) 1.532 Tunneling: Volume: (cm) 1.532 Undermining: n Area: 0% n Volume: -400.7% tion: None No No Wound Description Full Thickness Without Classification: Exposed Support Structures Diabetic Severity Grade 1 (Wagner): Wound Margin: Flat and Intact Exudate Amount: Large Exudate Type: Serous Exudate Color: amber Foul Odor After Cleansing: No Wound Bed Granulation Amount: Medium (34-66%) Exposed Structure [name], [place]. ([id number]) Granulation Quality: Red, Pink Fascia Exposed: No Necrotic Amount: Medium (34-66%) Fat Layer Exposed: No Necrotic Quality: Adherent Slough Tendon Exposed: No Muscle Exposed: No Joint Exposed: No Bone Exposed: No Limited to Skin Breakdown Periwound Skin Texture Texture Color No Abnormalities Noted: No No Abnormalities Noted: No Callus: No Atrophie Blanche: No Crepitus: No Cyanosis: No Excoriation: No Ecchymosis: No Fluctuance: No Erythema: No Friable: No Hemosiderin Staining: No Induration: No Mottled: No Localized Edema: Yes Pallor: No Rash: No Rubor: No Scarring: No Temperature / Pain Moisture Temperature: No Abnormality No Abnormalities Noted: No Tenderness on Palpation: Yes Dry / Scaly: No Maceration: No Moist: Yes Wound Preparation Ulcer Cleansing: Rinsed/Irrigated with Saline Topical Anesthetic Applied: Other: lidocaine 4%, Treatment Notes Wound #4 (Right, Distal Lower Leg) 1. Cleansed with: Clean wound with Normal Saline 2. Anesthetic Topical Lidocaine 4% cream to wound bed prior to debridement 3. Peri-wound Care: Skin Prep 4. Dressing Applied: Aquacel Ag 5. Secondary Dressing Applied Dry  Gauze 7. Secured with Recruitment consultant) Signed: 02/12/2015 5:07:36 PM By: Williemae Natter ([id number]) Entered By: Alric Quan on 02/12/2015 08:30:27 [name], [name] ([id number]) -------------------------------------------------------------------------------- [facility] [place] Details Patient Name: [name] Date of Service: 02/12/2015 8:00 AM Medical Record Number: [id number] Patient Account Number: [id number] Date of Birth/Sex: 1965-09-25 (50 y.o. Male) Treating RN: Ahmed Prima Primary Care Physician: [name], [place] Other Clinician: Referring Physician: [name], [place] Treating Physician/Extender: Frann Rider in Treatment: 6 Vital Signs Time Taken: 08:10 Temperature (F): 97.8 Height (in): 71 Pulse (bpm): 92 Weight (lbs): 360 Respiratory Rate (breaths/min): 20 Body Mass Index (BMI): 50.2 Blood Pressure (mmHg): 167/100 Reference Range: 80 - 120 mg / dl Notes pt states his BP is high because he is irritated Electronic Signature(s) Signed: 02/12/2015 5:07:36 PM By: Alric Quan Entered By: Alric Quan on 02/12/2015 03:70:96

## 2015-02-14 ENCOUNTER — Telehealth: Payer: Self-pay

## 2015-02-14 NOTE — Telephone Encounter (Signed)
Got a fax from the phone center stating that this patient called and states he has open wounds on his legs and wants to know how to care for them. What advice can I give to him?

## 2015-02-14 NOTE — Telephone Encounter (Signed)
Please tell him to call the wound center

## 2015-02-14 NOTE — Telephone Encounter (Signed)
Patient returned my call. I let him know that Dr. Sanda Klein suggested he call the wound center, Patient states he really was not calling about his wounds but he could not remember what he was calling for. I asked for him to give [place] a call if he thought about what he was calling for.

## 2015-02-14 NOTE — Telephone Encounter (Signed)
Called and left patient a voicemail asking for him to please return my call.

## 2015-02-15 DIAGNOSIS — E785 Hyperlipidemia, unspecified: Secondary | ICD-10-CM

## 2015-02-15 DIAGNOSIS — E1169 Type 2 diabetes mellitus with other specified complication: Secondary | ICD-10-CM | POA: Insufficient documentation

## 2015-02-19 ENCOUNTER — Encounter: Payer: Managed Care, Other (non HMO) | Admitting: Surgery

## 2015-02-19 DIAGNOSIS — E11622 Type 2 diabetes mellitus with other skin ulcer: Secondary | ICD-10-CM | POA: Diagnosis not present

## 2015-02-19 NOTE — Progress Notes (Signed)
[name], [name] ([id number]) Visit Report for 02/19/2015 Chief Complaint Document Details Patient Name: [name], [name]. Date of Service: 02/19/2015 8:00 AM Medical Record Number: [id number] Patient Account Number: [id number] Date of Birth/Sex: 08-14-1965 (50 y.o. Male) Treating RN: Ahmed Prima Primary Care Physician: [name], [place] Other Clinician: Referring Physician: [name], MELINDA Treating Physician/Extender: Frann Rider in Treatment: 7 Information Obtained from: Patient Chief Complaint Patients presents for treatment of an open diabetic ulcer over the right lower extremity with swelling of both legs for about 1 month. Electronic Signature(s) Signed: 02/19/2015 8:49:57 AM By: Christin Fudge MD, FACS Entered By: Christin Fudge on 02/19/2015 08:49:57 [name] ([id number]) -------------------------------------------------------------------------------- HPI Details Patient Name: [name] Date of Service: 02/19/2015 8:00 AM Medical Record Number: [id number] Patient Account Number: [id number] Date of Birth/Sex: [birth year] (50 y.o. Male) Treating RN: Ahmed Prima Primary Care Physician: [name], [place] Other Clinician: Referring Physician: [name], MELINDA Treating Physician/Extender: Frann Rider in Treatment: 7 History of Present Illness Location: Started getting pustules and boils on his right lower extremity along with swelling and was treated for this Quality: Patient reports experiencing a dull pain to affected area(s). Severity: Patient states wound are getting worse. Duration: Patient has had the wound for < 4 weeks prior to presenting for treatment Timing: Pain in wound is Intermittent (comes and goes Context: The wound appeared gradually over time Modifying Factors: Other treatment(s) tried include: local care and antibiotics and some compression stockings. Associated Signs and Symptoms: Patient reports having difficulty standing for  long periods. HPI Description: 50 year old gentleman who has type 2 diabetes mellitus and morbid obesity has been having cellulitis and abscess of the right lower leg for the last 3 weeks. He has had multiple antibiotics and an IandD and over the last 10 days has been on Septra DS and doxycycline and prior to that Keflex. the patient is a poor historian and does not give a definite timeline but says he's been diagnosed with diabetes mellitus for about 8-10 months and has been on insulin. He does not measure his blood sugars regularly nor does he see his PCP regularly. Also has congestive heart failure for which he sees a cardiologist at [facility] but this is going to change as the cardiologist as retired. He is a smoker and smokes several cigars a day and drinks alcohol occasionally. He has been morbidly obese for several years. 01/08/2015 -- the patient complains about having to use his 3 layer compression wrap and has multiple issues with this none of them being medically oriented. He also continues to smoke, has not yet seen his cardiologist or his PCP and in general has continued to not look after himself. 01/15/2015 -- the patient did not allow [place] to put his 3 layer compression wrap last week and has been using his own compression stockings. Continues to smoke. He did see his cardiologist and they have increased his dose of Lasix, asked him to repeat an echo, lose weight and be compliant with his diabetic control. 01/29/2015 -- he has given up smoking for about the last week. He is unable to keep his appointment on 01/30/2015 with the vascular surgeons and has rescheduled it for next week. 02/12/2015 -- he continues to have a lot of edema and drains from the wounds. However he is not agreeable to wear 3 or 4 layer compression wraps. He wants to wear his 20-30 mm compression stockings only. 02/19/2015 -- he was seen by the vascular surgeon Dr. Delana Meyer on 02/18/2015 and a  duplex  ultrasound of the lower extremities shows normal deep venous system, superficial reflux is not present in the great saphenous veins bilaterally and the left small saphenous vein. There is reflux in the small saphenous vein on the right and there are several sporadic varicosities and the distal thigh on the right. He was not offered surgery but graduated compression stockings class I were recommended on a daily basis and was given a PARSON, CARSON. ([id number]) prescription for this. He would follow-up with them in 3 months for possible using of lymph pumps at that time. Electronic Signature(s) Signed: 02/19/2015 8:51:55 AM By: Christin Fudge MD, FACS Previous Signature: 02/19/2015 8:51:34 AM Version By: Christin Fudge MD, FACS Entered By: Christin Fudge on 02/19/2015 08:51:55 [name] ([id number]) -------------------------------------------------------------------------------- Physical Exam Details Patient Name: [name] Date of Service: 02/19/2015 8:00 AM Medical Record Number: [id number] Patient Account Number: [id number] Date of Birth/Sex: 08-27-1965 (50 y.o. Male) Treating RN: Ahmed Prima Primary Care Physician: [name], [place] Other Clinician: Referring Physician: [name], MELINDA Treating Physician/Extender: Frann Rider in Treatment: 7 Constitutional . Pulse regular. Respirations normal and unlabored. Afebrile. . Eyes Nonicteric. Reactive to light. Ears, Nose, Mouth, and Throat Lips, teeth, and gums WNL.[name] Kitchen Moist mucosa without lesions. Neck supple and nontender. No palpable supraclavicular or cervical adenopathy. Normal sized without goiter. Respiratory WNL. No retractions.. Cardiovascular Pedal Pulses WNL. No clubbing, cyanosis or edema. Lymphatic No adneopathy. No adenopathy. No adenopathy. Musculoskeletal Adexa without tenderness or enlargement.. Digits and nails w/o clubbing, cyanosis, infection, petechiae, ischemia, or inflammatory  conditions.. Integumentary (Hair, Skin) No suspicious lesions. No crepitus or fluctuance. No peri-wound warmth or erythema. No masses.[name] Kitchen Psychiatric Judgement and insight Intact.. No evidence of depression, anxiety, or agitation.. Notes the edema is less and the wound depth is much better today on all 3 areas. There was no debris to debride sharply. Electronic Signature(s) Signed: 02/19/2015 8:52:26 AM By: Christin Fudge MD, FACS Entered By: Christin Fudge on 02/19/2015 08:52:26 [name] ([id number]) -------------------------------------------------------------------------------- Physician Orders Details Patient Name: [name] Date of Service: 02/19/2015 8:00 AM Medical Record Number: [id number] Patient Account Number: [id number] Date of Birth/Sex: 31-Oct-1965 (49 y.o. Male) Treating RN: Ahmed Prima Primary Care Physician: [name], [place] Other Clinician: Referring Physician: [name], MELINDA Treating Physician/Extender: Frann Rider in Treatment: 7 Verbal / Phone Orders: Yes ClinicianCarolyne Fiscal, Debi Read Back and Verified: Yes Diagnosis Coding Wound Cleansing Wound #2 Right,Lateral,Superior Lower Leg o Cleanse wound with mild soap and water o May Shower, gently pat wound dry prior to applying new dressing. Wound #3 Right,Medial Lower Leg o Cleanse wound with mild soap and water o May Shower, gently pat wound dry prior to applying new dressing. Wound #4 Right,Distal Lower Leg o Cleanse wound with mild soap and water o May Shower, gently pat wound dry prior to applying new dressing. Anesthetic Wound #2 Right,Lateral,Superior Lower Leg o Topical Lidocaine 4% cream applied to wound bed prior to debridement Wound #3 Right,Medial Lower Leg o Topical Lidocaine 4% cream applied to wound bed prior to debridement Wound #4 Right,Distal Lower Leg o Topical Lidocaine 4% cream applied to wound bed prior to debridement Primary Wound Dressing Wound  #2 Right,Lateral,Superior Lower Leg o Aquacel Ag Wound #3 Right,Medial Lower Leg o Aquacel Ag Wound #4 Right,Distal Lower Leg o Aquacel Ag Secondary Dressing Wound #2 Right,Lateral,Superior Lower Leg o Dry Gauze - tape DIOMAR, BREESE. ([id number]) Wound #3 Right,Medial Lower Leg o Dry Gauze - tape Wound #4 Right,Distal Lower Leg o Dry Gauze -  tape Dressing Change Frequency Wound #2 Right,Lateral,Superior Lower Leg o Change dressing every other day. Wound #3 Right,Medial Lower Leg o Change dressing every other day. Wound #4 Right,Distal Lower Leg o Change dressing every other day. Follow-up Appointments Wound #2 Right,Lateral,Superior Lower Leg o Return Appointment in 1 week. Wound #3 Right,Medial Lower Leg o Return Appointment in 1 week. Wound #4 Right,Distal Lower Leg o Return Appointment in 1 week. Edema Control Wound #2 Right,Lateral,Superior Lower Leg o Patient to wear own compression stockings o Elevate legs to the level of the heart and pump ankles as often as possible Wound #3 Right,Medial Lower Leg o Patient to wear own compression stockings o Elevate legs to the level of the heart and pump ankles as often as possible Wound #4 Right,Distal Lower Leg o Patient to wear own compression stockings o Elevate legs to the level of the heart and pump ankles as often as possible Electronic Signature(s) Signed: 02/19/2015 2:32:13 PM By: Christin Fudge MD, FACS Signed: 02/19/2015 3:03:16 PM By: Alric Quan Entered By: Alric Quan on 02/19/2015 08:47:13 [name] ([id number]) -------------------------------------------------------------------------------- Problem List Details Patient Name: [name] Date of Service: 02/19/2015 8:00 AM Medical Record Number: [id number] Patient Account Number: [id number] Date of Birth/Sex: 01-Jan-1966 (49 y.o. Male) Treating RN: Ahmed Prima Primary Care Physician: [name],  [place] Other Clinician: Referring Physician: [name], [place] Treating Physician/Extender: Frann Rider in Treatment: 7 Active Problems ICD-10 Encounter Code Description Active Date Diagnosis E11.622 Type 2 diabetes mellitus with other skin ulcer 01/01/2015 Yes E66.01 Morbid (severe) obesity due to excess calories 01/01/2015 Yes L97.212 Non-pressure chronic ulcer of right calf with fat layer 02/05/2015 Yes exposed I89.0 Lymphedema, not elsewhere classified 01/01/2015 Yes I50.40 Unspecified combined systolic (congestive) and diastolic [id number] Yes (congestive) heart failure F17.218 Nicotine dependence, cigarettes, with other nicotine- 01/01/2015 Yes induced disorders 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 02/19/2015 Yes Inactive Problems Resolved Problems ICD-10 Code Description Active Date Resolved Date L02.415 Cutaneous abscess of right lower limb 01/01/2015 01/01/2015 [name] ([id number]) Electronic Signature(s) Signed: 02/19/2015 8:49:50 AM By: Christin Fudge MD, FACS Entered By: Christin Fudge on 02/19/2015 08:49:50 [name] ([id number]) -------------------------------------------------------------------------------- Progress Note Details Patient Name: [name] Date of Service: 02/19/2015 8:00 AM Medical Record Number: [id number] Patient Account Number: [id number] Date of Birth/Sex: 1965/07/13 (50 y.o. Male) Treating RN: Ahmed Prima Primary Care Physician: [name], [place] Other Clinician: Referring Physician: [name], MELINDA Treating Physician/Extender: Frann Rider in Treatment: 7 Subjective Chief Complaint Information obtained from Patient Patients presents for treatment of an open diabetic ulcer over the right lower extremity with swelling of both legs for about 1 month. History of Present Illness (HPI) The following HPI elements were documented for the patient's wound: Location: Started getting pustules  and boils on his right lower extremity along with swelling and was treated for this Quality: Patient reports experiencing a dull pain to affected area(s). Severity: Patient states wound are getting worse. Duration: Patient has had the wound for < 4 weeks prior to presenting for treatment Timing: Pain in wound is Intermittent (comes and goes Context: The wound appeared gradually over time Modifying Factors: Other treatment(s) tried include: local care and antibiotics and some compression stockings. Associated Signs and Symptoms: Patient reports having difficulty standing for long periods. 50 year old gentleman who has type 2 diabetes mellitus and morbid obesity has been having cellulitis and abscess of the right lower leg for the last 3 weeks. He has had multiple antibiotics and an IandD  and over the last 10 days has been on Septra DS and doxycycline and prior to that Keflex. the patient is a poor historian and does not give a definite timeline but says he's been diagnosed with diabetes mellitus for about 8-10 months and has been on insulin. He does not measure his blood sugars regularly nor does he see his PCP regularly. Also has congestive heart failure for which he sees a cardiologist at [facility] but this is going to change as the cardiologist as retired. He is a smoker and smokes several cigars a day and drinks alcohol occasionally. He has been morbidly obese for several years. 01/08/2015 -- the patient complains about having to use his 3 layer compression wrap and has multiple issues with this none of them being medically oriented. He also continues to smoke, has not yet seen his cardiologist or his PCP and in general has continued to not look after himself. 01/15/2015 -- the patient did not allow [place] to put his 3 layer compression wrap last week and has been using his own compression stockings. Continues to smoke. He did see his cardiologist and they have increased his dose of Lasix,  asked him to repeat an echo, lose weight and be compliant with his diabetic control. 01/29/2015 -- he has given up smoking for about the last week. He is unable to keep his appointment on 01/30/2015 with the vascular surgeons and has rescheduled it for next week. 02/12/2015 -- he continues to have a lot of edema and drains from the wounds. However he is not [name], [name]. ([id number]) agreeable to wear 3 or 4 layer compression wraps. He wants to wear his 20-30 mm compression stockings only. 02/19/2015 -- he was seen by the vascular surgeon Dr. Delana Meyer on 02/18/2015 and a duplex ultrasound of the lower extremities shows normal deep venous system, superficial reflux is not present in the great saphenous veins bilaterally and the left small saphenous vein. There is reflux in the small saphenous vein on the right and there are several sporadic varicosities and the distal thigh on the right. He was not offered surgery but graduated compression stockings class I were recommended on a daily basis and was given a prescription for this. He would follow-up with them in 3 months for possible using of lymph pumps at that time. Objective Constitutional Pulse regular. Respirations normal and unlabored. Afebrile. Vitals Time Taken: 8:13 AM, Height: 71 in, Weight: 360 lbs, BMI: 50.2, Temperature: 98.2 F, Pulse: 82 bpm, Respiratory Rate: 20 breaths/min, Blood Pressure: 171/101 mmHg. Eyes Nonicteric. Reactive to light. Ears, Nose, Mouth, and Throat Lips, teeth, and gums WNL.[name] Kitchen Moist mucosa without lesions. Neck supple and nontender. No palpable supraclavicular or cervical adenopathy. Normal sized without goiter. Respiratory WNL. No retractions.. Cardiovascular Pedal Pulses WNL. No clubbing, cyanosis or edema. Lymphatic No adneopathy. No adenopathy. No adenopathy. Musculoskeletal Adexa without tenderness or enlargement.. Digits and nails w/o clubbing, cyanosis, infection, petechiae, ischemia, or  inflammatory conditions.[name] Kitchen Psychiatric Judgement and insight Intact.. No evidence of depression, anxiety, or agitation.[name] Kitchen [name], [name] ([id number]) General Notes: the edema is less and the wound depth is much better today on all 3 areas. There was no debris to debride sharply. Integumentary (Hair, Skin) No suspicious lesions. No crepitus or fluctuance. No peri-wound warmth or erythema. No masses.. Wound #2 status is Open. Original cause of wound was Gradually Appeared. The wound is located on the Right,Lateral,Superior Lower Leg. The wound measures 0.4cm length x 0.6cm width x 2cm depth; 0.188cm^2 area and  0.377cm^3 volume. The wound is limited to skin breakdown. There is no tunneling or undermining noted. There is a large amount of serous drainage noted. The wound margin is flat and intact. There is large (67-100%) pink granulation within the wound bed. There is a small (1-33%) amount of necrotic tissue within the wound bed including Adherent Slough. The periwound skin appearance exhibited: Localized Edema, Moist. Periwound temperature was noted as No Abnormality. The periwound has tenderness on palpation. Wound #3 status is Open. Original cause of wound was Gradually Appeared. The wound is located on the Right,Medial Lower Leg. The wound measures 1cm length x 1.5cm width x 1cm depth; 1.178cm^2 area and 1.178cm^3 volume. The wound is limited to skin breakdown. There is no tunneling or undermining noted. There is a large amount of serous drainage noted. The wound margin is flat and intact. There is medium (34-66%) red, pink granulation within the wound bed. There is a medium (34-66%) amount of necrotic tissue within the wound bed including Adherent Slough. The periwound skin appearance exhibited: Localized Edema, Moist. The periwound skin appearance did not exhibit: Callus, Crepitus, Excoriation, Fluctuance, Friable, Induration, Rash, Scarring, Dry/Scaly, Maceration, Atrophie Blanche,  Cyanosis, Ecchymosis, Hemosiderin Staining, Mottled, Pallor, Rubor, Erythema. The periwound has tenderness on palpation. Wound #4 status is Open. Original cause of wound was Gradually Appeared. The wound is located on the Right,Distal Lower Leg. The wound measures 1cm length x 1cm width x 0.6cm depth; 0.785cm^2 area and 0.471cm^3 volume. The wound is limited to skin breakdown. There is no tunneling or undermining noted. There is a large amount of serous drainage noted. The wound margin is flat and intact. There is medium (34-66%) red, pink granulation within the wound bed. There is a medium (34-66%) amount of necrotic tissue within the wound bed including Adherent Slough. The periwound skin appearance exhibited: Localized Edema, Moist. The periwound skin appearance did not exhibit: Callus, Crepitus, Excoriation, Fluctuance, Friable, Induration, Rash, Scarring, Dry/Scaly, Maceration, Atrophie Blanche, Cyanosis, Ecchymosis, Hemosiderin Staining, Mottled, Pallor, Rubor, Erythema. Periwound temperature was noted as No Abnormality. The periwound has tenderness on palpation. Assessment Active Problems ICD-10 E11.622 - Type 2 diabetes mellitus with other skin ulcer E66.01 - Morbid (severe) obesity due to excess calories L97.212 - Non-pressure chronic ulcer of right calf with fat layer exposed I89.0 - Lymphedema, not elsewhere classified [name], [name]. ([id number]) I50.40 - Unspecified combined systolic (congestive) and diastolic (congestive) heart failure F17.218 - Nicotine dependence, cigarettes, with other nicotine-induced disorders I83.012 - 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 I have again discussed with him his vascular report and the need to wear compression stockings at all times from morning to night and he is agreeable about this. He will not agree to compression wraps to be applied to his legs. He will see [place] back next week. Plan Wound Cleansing: Wound #2  Right,Lateral,Superior Lower Leg: Cleanse wound with mild soap and water May Shower, gently pat wound dry prior to applying new dressing. Wound #3 Right,Medial Lower Leg: Cleanse wound with mild soap and water May Shower, gently pat wound dry prior to applying new dressing. Wound #4 Right,Distal Lower Leg: Cleanse wound with mild soap and water May Shower, gently pat wound dry prior to applying new dressing. Anesthetic: Wound #2 Right,Lateral,Superior Lower Leg: Topical Lidocaine 4% cream applied to wound bed prior to debridement Wound #3 Right,Medial Lower Leg: Topical Lidocaine 4% cream applied to wound bed prior to debridement Wound #4 Right,Distal Lower Leg: Topical Lidocaine 4% cream applied to  wound bed prior to debridement Primary Wound Dressing: Wound #2 Right,Lateral,Superior Lower Leg: Aquacel Ag Wound #3 Right,Medial Lower Leg: Aquacel Ag Wound #4 Right,Distal Lower Leg: Aquacel Ag Secondary Dressing: Wound #2 Right,Lateral,Superior Lower Leg: Dry Gauze - tape Wound #3 Right,Medial Lower Leg: Dry Gauze - tape Wound #4 Right,Distal Lower Leg: Dry Gauze - tape Dressing Change Frequency: [name], [name] ([id number]) Wound #2 Right,Lateral,Superior Lower Leg: Change dressing every other day. Wound #3 Right,Medial Lower Leg: Change dressing every other day. Wound #4 Right,Distal Lower Leg: Change dressing every other day. Follow-up Appointments: Wound #2 Right,Lateral,Superior Lower Leg: Return Appointment in 1 week. Wound #3 Right,Medial Lower Leg: Return Appointment in 1 week. Wound #4 Right,Distal Lower Leg: Return Appointment in 1 week. Edema Control: Wound #2 Right,Lateral,Superior Lower Leg: Patient to wear own compression stockings Elevate legs to the level of the heart and pump ankles as often as possible Wound #3 Right,Medial Lower Leg: Patient to wear own compression stockings Elevate legs to the level of the heart and pump ankles as often as  possible Wound #4 Right,Distal Lower Leg: Patient to wear own compression stockings Elevate legs to the level of the heart and pump ankles as often as possible I have again discussed with him his vascular report and the need to wear compression stockings at all times from morning to night and he is agreeable about this. He will not agree to compression wraps to be applied to his legs. He will see [place] back next week. Electronic Signature(s) Signed: 02/19/2015 8:53:07 AM By: Christin Fudge MD, FACS Entered By: Christin Fudge on 02/19/2015 08:53:07 [name] ([id number]) -------------------------------------------------------------------------------- SuperBill Details Patient Name: [name] Date of Service: 02/19/2015 Medical Record Number: [id number] Patient Account Number: [id number] Date of Birth/Sex: 07/16/1965 (49 y.o. Male) Treating RN: Ahmed Prima Primary Care Physician: [name], [place] Other Clinician: Referring Physician: [name], [place] Treating Physician/Extender: Frann Rider in Treatment: 7 Diagnosis Coding ICD-10 Codes Code Description E11.622 Type 2 diabetes mellitus with other skin ulcer E66.01 Morbid (severe) obesity due to excess calories L97.212 Non-pressure chronic ulcer of right calf with fat layer exposed I89.0 Lymphedema, not elsewhere classified I50.40 Unspecified combined systolic (congestive) and diastolic (congestive) heart failure F17.218 Nicotine dependence, cigarettes, with other nicotine-induced disorders 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 Facility Procedures CPT4 Code: [id number] Description: 99214 - WOUND CARE VISIT-LEV 4 EST PT Modifier: Quantity: 1 Physician Procedures CPT4 Code: [id number] Description: 99213 - WC PHYS LEVEL 3 - EST PT ICD-10 Description Diagnosis E11.622 Type 2 diabetes mellitus with other skin ulcer I83.012 Varicose veins of right lower extremity with ulcer E66.01 Morbid (severe)  obesity due to excess calories I89.0  Lymphedema, not elsewhere classified Modifier: of calf Quantity: 1 Electronic Signature(s) Signed: 02/19/2015 2:32:13 PM By: Christin Fudge MD, FACS Signed: 02/19/2015 3:03:16 PM By: Alric Quan Previous Signature: 02/19/2015 8:53:22 AM Version By: Christin Fudge MD, FACS Entered By: Alric Quan on 02/19/2015 14:09:33

## 2015-02-19 NOTE — Progress Notes (Signed)
KEVONTA, PHARISS ([id number]) Visit Report for 02/19/2015 Arrival Information Details Patient Name: [name], [name]. Date of Service: 02/19/2015 8:00 AM Medical Record Number: [id number] Patient Account Number: [id number] Date of Birth/Sex: Sep 05, 1965 (50 y.o. Male) Treating RN: Ahmed Prima Primary Care Physician: [name], [place] Other Clinician: Referring Physician: [name], [place] Treating Physician/Extender: Frann Rider in Treatment: 7 Visit Information History Since Last Visit All ordered tests and consults were completed: No Patient Arrived: Ambulatory Added or deleted any medications: No Arrival Time: 08:12 Any new allergies or adverse reactions: No Accompanied By: self Had a fall or experienced change in No Transfer Assistance: None activities of daily living that may affect Patient Identification Verified: Yes risk of falls: Secondary Verification Process Yes Signs or symptoms of abuse/neglect since last No Completed: visito Patient Requires Transmission-Based No Hospitalized since last visit: No Precautions: Pain Present Now: No Patient Has Alerts: Yes Patient Alerts: DM II Electronic Signature(s) Signed: 02/19/2015 3:03:16 PM By: Alric Quan Entered By: Alric Quan on 02/19/2015 08:13:25 [name] ([id number]) -------------------------------------------------------------------------------- Clinic Level of Care Assessment Details Patient Name: [name] Date of Service: 02/19/2015 8:00 AM Medical Record Number: [id number] Patient Account Number: [id number] Date of Birth/Sex: 1965-08-28 (50 y.o. Male) Treating RN: Ahmed Prima Primary Care Physician: [name], [place] Other Clinician: Referring Physician: [name], MELINDA Treating Physician/Extender: Frann Rider in Treatment: 7 Clinic Level of Care Assessment Items TOOL 4 Quantity Score _0  - Use when only an EandM is performed on FOLLOW-UP visit 0 ASSESSMENTS - Nursing  Assessment / Reassessment _1  - Reassessment of Co-morbidities (includes updates in patient status) 0 X - Reassessment of Adherence to Treatment Plan 1 5 ASSESSMENTS - Wound and Skin Assessment / Reassessment _2  - Simple Wound Assessment / Reassessment - one wound 0 X - Complex Wound Assessment / Reassessment - multiple wounds 3 5 _3  - Dermatologic / Skin Assessment (not related to wound area) 0 ASSESSMENTS - Focused Assessment _4  - Circumferential Edema Measurements - multi extremities 0 _5  - Nutritional Assessment / Counseling / Intervention 0 _6  - Lower Extremity Assessment (monofilament, tuning fork, pulses) 0 _7  - Peripheral Arterial Disease Assessment (using hand held doppler) 0 ASSESSMENTS - Ostomy and/or Continence Assessment and Care _8  - Incontinence Assessment and Management 0 _9  - Ostomy Care Assessment and Management (repouching, etc.) 0 PROCESS - Coordination of Care X - Simple Patient / Family Education for ongoing care 1 15 _10  - Complex (extensive) Patient / Family Education for ongoing care 0 _11  - Staff obtains Programmer, systems, Records, Test Results / Process Orders 0 _12  - Staff telephones HHA, Nursing Homes / Clarify orders / etc 0 _13  - Routine Transfer to another Facility (non-emergent condition) 0 [name], [name]. ([id number]) _14  - Routine Hospital Admission (non-emergent condition) 0 _15  - New Admissions / Biomedical engineer / Ordering NPWT, Apligraf, etc. 0 _16  - Emergency Hospital Admission (emergent condition) 0 X - Simple Discharge Coordination 1 10 _17  - Complex (extensive) Discharge Coordination 0 PROCESS - Special Needs _18  - Pediatric / Minor Patient Management 0 _19  - Isolation Patient Management 0 _20  - Hearing / Language / Visual special needs 0 _21  - Assessment of Community assistance (transportation, D/C planning, etc.) 0 _22  - Additional assistance / Altered mentation 0 _23  - Support Surface(s) Assessment (bed, cushion, seat, etc.) 0 INTERVENTIONS - Wound  Cleansing / Measurement _24  - Simple Wound Cleansing - one wound 0 X - Complex Wound Cleansing - multiple wounds 3 5 X - Wound Imaging (photographs - any number of wounds) 1  5 _0  - Wound Tracing (instead of photographs) 0 _1  - Simple Wound Measurement - one wound 0 X - Complex Wound Measurement - multiple wounds 3 5 INTERVENTIONS - Wound Dressings X - Small Wound Dressing one or multiple wounds 3 10 _2  - Medium Wound Dressing one or multiple wounds 0 _3  - Large Wound Dressing one or multiple wounds 0 X - Application of Medications - topical 1 5 <[id number]>_3<\/[id number]>_9  - Application of Medications - injection 0 INTERVENTIONS - Miscellaneous _5  - External ear exam 0 [name], [name]. ([id number]) _6  - Specimen Collection (cultures, biopsies, blood, body fluids, etc.) 0 _7  - Specimen(s) / Culture(s) sent or taken to Lab for analysis 0 _8  - Patient Transfer (multiple staff / Harrel Lemon Lift / Similar devices) 0 _9  - Simple Staple / Suture removal (25 or less) 0 _10  - Complex Staple / Suture removal (26 or more) 0 _11  - Hypo / Hyperglycemic Management (close monitor of Blood Glucose) 0 _12  - Ankle / Brachial Index (ABI) - do not check if billed separately 0 X - Vital Signs 1 5 Has the patient been seen at the hospital within the last three years: Yes Total Score: 120 Level Of Care: New/Established - Level 4 Electronic Signature(s) Signed: 02/19/2015 3:03:16 PM By: Alric Quan Entered By: Alric Quan on 02/19/2015 14:09:15 [name] ([id number]) -------------------------------------------------------------------------------- Encounter Discharge Information Details Patient Name: [name] Date of Service: 02/19/2015 8:00 AM Medical Record Number: [id number] Patient Account Number: [id number] Date of Birth/Sex: 03/13/1965 (49 y.o. Male) Treating RN: Ahmed Prima Primary Care Physician: [name], [place] Other Clinician: Referring Physician: [name], MELINDA Treating Physician/Extender: Frann Rider in Treatment: 7 Encounter Discharge Information Items Discharge Pain Level: 0 Discharge Condition: Stable Ambulatory Status: Ambulatory Discharge Destination: Home Transportation: Private Auto Accompanied By: self Schedule Follow-up Appointment: Yes Medication Reconciliation completed and provided to Patient/Care Yes Lankford Gutzmer: Provided on Clinical Summary of Care: 02/19/2015 Form Type Recipient Paper Patient RT Electronic Signature(s) Signed: 02/19/2015 9:04:05 AM By: Ruthine Dose Entered By: Ruthine Dose on 02/19/2015 09:04:05 [name] ([id number]) -------------------------------------------------------------------------------- Lower Extremity Assessment Details Patient Name: [name] Date of Service: 02/19/2015 8:00 AM Medical Record Number: [id number] Patient Account Number: [id number] Date of Birth/Sex: Nov 30, 1965 (49 y.o. Male) Treating RN: Ahmed Prima Primary Care Physician: [name], [place] Other Clinician: Referring Physician: [name], MELINDA Treating Physician/Extender: Frann Rider in Treatment: 7 Edema Assessment Assessed: [Left: No] [Right: No] Edema: [Left: Ye] [Right: s] Calf Left: Right: Point of Measurement: cm From Medial Instep cm 49.5 cm Ankle Left: Right: Point of Measurement: cm From Medial Instep cm 30.3 cm Vascular Assessment Pulses: Posterior Tibial Dorsalis Pedis Palpable: [Right:Yes] Extremity colors, hair growth, and conditions: Extremity Color: [Right:Normal] Temperature of Extremity: [Right:Warm] Capillary Refill: [Right:< 3 seconds] Toe Nail Assessment Left: Right: Thick: No Discolored: No Deformed: No Improper Length and Hygiene: No Electronic Signature(s) Signed: 02/19/2015 3:03:16 PM By: Alric Quan Entered By: Alric Quan on 02/19/2015 08:21:43 [name] ([id number]) -------------------------------------------------------------------------------- Multi Wound Chart  Details Patient Name: [name] Date of Service: 02/19/2015 8:00 AM Medical Record Number: [id number] Patient Account Number: [id number] Date of Birth/Sex: [birth year] (50 y.o. Male) Treating RN: Ahmed Prima Primary Care Physician: [name], [place] Other Clinician: Referring Physician: [name], MELINDA Treating Physician/Extender: Frann Rider in Treatment: 7 Vital Signs Height(in): 71 Pulse(bpm): 82 Weight(lbs): 360 Blood Pressure 171/101 (mmHg): Body Mass Index(BMI): 50 Temperature(F): 98.2 Respiratory Rate 20 (breaths/min): Photos: [2:No Photos] [3:No Photos] [4:No Photos] Wound Location: [2:Right Lower Leg - Lateral, Right  Lower Leg - Medial Right Lower Leg - Distal Superior] Wounding Event: [2:Gradually Appeared] [3:Gradually Appeared] [4:Gradually Appeared] Primary Etiology: [2:Venous Leg Ulcer] [3:Venous Leg Ulcer] [4:Venous Leg Ulcer] Comorbid History: [2:Lymphedema, Congestive Lymphedema, Congestive Lymphedema, Congestive Heart Failure, Hypertension, Type II Diabetes, Gout] [3:Heart Failure, Hypertension, Type II Diabetes, Gout] [4:Heart Failure, Hypertension, Type II Diabetes, Gout] Date Acquired: [2:12/13/2014] [3:12/13/2014] [4:12/13/2014] Weeks of Treatment: [2:7] [3:7] [4:7] Wound Status: [2:Open] [3:Open] [4:Open] Measurements L x W x D 0.4x0.6x2 [3:1x1.5x1] [4:1x1x0.6] (cm) Area (cm) : [2:0.188] [3:1.178] [4:0.785] Volume (cm) : [2:0.377] [3:1.178] [4:0.471] % Reduction in Area: [2:76.10%] [3:37.50%] [4:48.80%] % Reduction in Volume: -140.10% [3:-212.50%] [4:-53.90%] Classification: [2:Full Thickness Without Exposed Support Structures] [3:Full Thickness Without Exposed Support Structures] [4:Full Thickness Without Exposed Support Structures] HBO Classification: [2:Grade 1] [3:Grade 1] [4:Grade 1] Exudate Amount: [2:Large] [3:Large] [4:Large] Exudate Type: [2:Serous] [3:Serous] [4:Serous] Exudate Color: [2:amber] [3:amber] [4:amber] Wound  Margin: [2:Flat and Intact] [3:Flat and Intact] [4:Flat and Intact] Granulation Amount: [2:Large (67-100%)] [3:Medium (34-66%)] [4:Medium (34-66%)] Granulation Quality: [2:Pink] [3:Red, Pink] [4:Red, Pink] Necrotic Amount: [2:Small (1-33%)] [3:Medium (34-66%)] [4:Medium (34-66%)] Exposed Structures: [name], [name] ([id number]) Fascia: No Fascia: No Fascia: No Fat: No Fat: No Fat: No Tendon: No Tendon: No Tendon: No Muscle: No Muscle: No Muscle: No Joint: No Joint: No Joint: No Bone: No Bone: No Bone: No Limited to Skin Limited to Skin Limited to Skin Breakdown Breakdown Breakdown Epithelialization: None None None Periwound Skin Texture: Edema: Yes Edema: Yes Edema: Yes Excoriation: No Excoriation: No Induration: No Induration: No Callus: No Callus: No Crepitus: No Crepitus: No Fluctuance: No Fluctuance: No Friable: No Friable: No Rash: No Rash: No Scarring: No Scarring: No Periwound Skin Moist: Yes Moist: Yes Moist: Yes Moisture: Maceration: No Maceration: No Dry/Scaly: No Dry/Scaly: No Periwound Skin Color: No Abnormalities Noted Atrophie Blanche: No Atrophie Blanche: No Cyanosis: No Cyanosis: No Ecchymosis: No Ecchymosis: No Erythema: No Erythema: No Hemosiderin Staining: No Hemosiderin Staining: No Mottled: No Mottled: No Pallor: No Pallor: No Rubor: No Rubor: No Temperature: No Abnormality N/A No Abnormality Tenderness on Yes Yes Yes Palpation: Wound Preparation: Ulcer Cleansing: Other: Ulcer Cleansing: Other: Ulcer Cleansing: soap and water soap and water Rinsed/Irrigated with Saline Topical Anesthetic Topical Anesthetic Applied: Other: lidocaine Applied: Other: lidocaine Topical Anesthetic 4% 4% Applied: Other: lidocaine 4% Treatment Notes Electronic Signature(s) Signed: 02/19/2015 3:03:16 PM By: Alric Quan Entered By: Alric Quan on 02/19/2015 08:29:58 [name]  ([id number]) -------------------------------------------------------------------------------- [place] Details Patient Name: [name] Date of Service: 02/19/2015 8:00 AM Medical Record Number: [id number] Patient Account Number: [id number] Date of Birth/Sex: [birth year] (49 y.o. Male) Treating RN: Ahmed Prima Primary Care Physician: [name], [place] Other Clinician: Referring Physician: [name], MELINDA Treating Physician/Extender: Frann Rider in Treatment: 7 Active Inactive Abuse / Safety / Falls / Self Care Management Nursing Diagnoses: Potential for falls Goals: Patient will remain injury free Date Initiated: 01/01/2015 Goal Status: Active Patient/caregiver will verbalize/demonstrate measures taken to prevent injury and/or falls Date Initiated: 01/01/2015 Goal Status: Active Interventions: Assess fall risk on admission and as needed Notes: Nutrition Nursing Diagnoses: Imbalanced nutrition Goals: Patient/caregiver agrees to and verbalizes understanding of need to use nutritional supplements and/or vitamins as prescribed Date Initiated: 01/01/2015 Goal Status: Active Patient/caregiver verbalizes understanding of need to maintain therapeutic glucose control per primary care physician Date Initiated: 01/01/2015 Goal Status: Active Patient/caregiver will maintain therapeutic glucose control Date Initiated: 01/01/2015 Goal Status: Active Interventions: [name], [name] ([id number]) Provide education on elevated blood sugars and impact on wound  healing Provide education on nutrition Treatment Activities: Education provided on Nutrition : 01/08/2015 Notes: Orientation to the Wound Care Program Nursing Diagnoses: Knowledge deficit related to the wound healing center program Goals: Patient/caregiver will verbalize understanding of the [place] Program Date Initiated: 01/01/2015 Goal Status: Active Interventions: Provide education  on orientation to the wound center Notes: Pain, Acute or Chronic Nursing Diagnoses: Pain, acute or chronic: actual or potential Goals: Patient will verbalize adequate pain control and receive pain control interventions during procedures as needed Date Initiated: 01/01/2015 Goal Status: Active Patient/caregiver will verbalize adequate pain control between visits Date Initiated: 01/01/2015 Goal Status: Active Patient/caregiver will verbalize comfort level met Date Initiated: 01/01/2015 Goal Status: Active Interventions: Assess comfort goal upon admission Complete pain assessment as per visit requirements Notes: Wound/Skin Impairment [name], [name] ([id number]) Nursing Diagnoses: Impaired tissue integrity Knowledge deficit related to smoking impact on wound healing Knowledge deficit related to ulceration/compromised skin integrity Goals: Patient will demonstrate a reduced rate of smoking or cessation of smoking Date Initiated: 01/01/2015 Goal Status: Active Patient/caregiver will verbalize understanding of skin care regimen Date Initiated: 01/01/2015 Goal Status: Active Ulcer/skin breakdown will have a volume reduction of 30% by week 4 Date Initiated: 01/01/2015 Goal Status: Active Ulcer/skin breakdown will have a volume reduction of 50% by week 8 Date Initiated: 01/01/2015 Goal Status: Active Ulcer/skin breakdown will have a volume reduction of 80% by week 12 Date Initiated: 01/01/2015 Goal Status: Active Interventions: Assess patient/caregiver ability to obtain necessary supplies Assess patient/caregiver ability to perform ulcer/skin care regimen upon admission and as needed Notes: Electronic Signature(s) Signed: 02/19/2015 3:03:16 PM By: Alric Quan Entered By: Alric Quan on 02/19/2015 83:41:96 [name] ([id number]) -------------------------------------------------------------------------------- Pain Assessment Details Patient Name: [name] Date of Service: 02/19/2015 8:00 AM Medical Record Number: [id number] Patient Account Number: [id number] Date of Birth/Sex: 09-Feb-1965 (50 y.o. Male) Treating RN: Ahmed Prima Primary Care Physician: [name], [place] Other Clinician: Referring Physician: [name], [place] Treating Physician/Extender: Frann Rider in Treatment: 7 Active Problems Location of Pain Severity and Description of Pain Patient Has Paino No Site Locations Pain Management and Medication Current Pain Management: Electronic Signature(s) Signed: 02/19/2015 3:03:16 PM By: Alric Quan Entered By: Alric Quan on 02/19/2015 08:13:31 [name] ([id number]) -------------------------------------------------------------------------------- Patient/Caregiver Education Details Patient Name: [name] Date of Service: 02/19/2015 8:00 AM Medical Record Number: [id number] Patient Account Number: [id number] Date of Birth/Gender: [birth year] (49 y.o. Male) Treating RN: Ahmed Prima Primary Care Physician: [name], [place] Other Clinician: Referring Physician: [name], [place] Treating Physician/Extender: Frann Rider in Treatment: 7 Education Assessment Education Provided To: Patient Education Topics Provided Wound/Skin Impairment: Handouts: Other: change dressing as directed Methods: Demonstration, Explain/Verbal Responses: State content correctly Electronic Signature(s) Signed: 02/19/2015 3:03:16 PM By: Alric Quan Entered By: Alric Quan on 02/19/2015 08:48:27 [name] ([id number]) -------------------------------------------------------------------------------- Wound Assessment Details Patient Name: [name] Date of Service: 02/19/2015 8:00 AM Medical Record Number: [id number] Patient Account Number: [id number] Date of Birth/Sex: 03-23-1965 (50 y.o. Male) Treating RN: Ahmed Prima Primary Care Physician: [name], [place] Other Clinician: Referring  Physician: [name], MELINDA Treating Physician/Extender: Frann Rider in Treatment: 7 Wound Status Wound Number: 2 Primary Venous Leg Ulcer Etiology: Wound Location: Right Lower Leg - Lateral, Superior Wound Open Status: Wounding Event: Gradually Appeared Comorbid Lymphedema, Congestive Heart Date Acquired: 12/13/2014 History: Failure, Hypertension, Type II Weeks Of Treatment: 7 Diabetes, Gout Clustered Wound: No Photos Photo Uploaded By: Alric Quan on 02/19/2015 14:04:54 Wound Measurements Length: (cm) 0.4 % Reduction  i Width: (cm) 0.6 % Reduction i Depth: (cm) 2 Epithelializa Area: (cm) 0.188 Tunneling: Volume: (cm) 0.377 Undermining: n Area: 76.1% n Volume: -140.1% tion: None No No Wound Description Full Thickness Without Classification: Exposed Support Structures Diabetic Severity Grade 1 (Wagner): Wound Margin: Flat and Intact Exudate Amount: Large Exudate Type: Serous Exudate Color: amber Foul Odor After Cleansing: No Wound Bed Granulation Amount: Large (67-100%) Exposed Structure [name], [name] ([id number]) Granulation Quality: Pink Fascia Exposed: No Necrotic Amount: Small (1-33%) Fat Layer Exposed: No Necrotic Quality: Adherent Slough Tendon Exposed: No Muscle Exposed: No Joint Exposed: No Bone Exposed: No Limited to Skin Breakdown Periwound Skin Texture Texture Color No Abnormalities Noted: No No Abnormalities Noted: No Localized Edema: Yes Temperature / Pain Moisture Temperature: No Abnormality No Abnormalities Noted: No Tenderness on Palpation: Yes Moist: Yes Wound Preparation Ulcer Cleansing: Other: soap and water, Topical Anesthetic Applied: Other: lidocaine 4%, Treatment Notes Wound #2 (Right, Lateral, Superior Lower Leg) 1. Cleansed with: Clean wound with Normal Saline 2. Anesthetic Topical Lidocaine 4% cream to wound bed prior to debridement 4. Dressing Applied: Aquacel Ag 5. Secondary Dressing  Applied Dry Gauze 7. Secured with Recruitment consultant) Signed: 02/19/2015 3:03:16 PM By: Alric Quan Entered By: Alric Quan on 02/19/2015 47:65:46 TKPTWSF, KCLEXN T. ([id number]) -------------------------------------------------------------------------------- Wound Assessment Details Patient Name: [name] Date of Service: 02/19/2015 8:00 AM Medical Record Number: [id number] Patient Account Number: [id number] Date of Birth/Sex: 11-10-1965 (49 y.o. Male) Treating RN: Ahmed Prima Primary Care Physician: [name], [place] Other Clinician: Referring Physician: [name], [place] Treating Physician/Extender: Frann Rider in Treatment: 7 Wound Status Wound Number: 3 Primary Venous Leg Ulcer Etiology: Wound Location: Right Lower Leg - Medial Wound Open Wounding Event: Gradually Appeared Status: Date Acquired: 12/13/2014 Comorbid Lymphedema, Congestive Heart Weeks Of Treatment: 7 History: Failure, Hypertension, Type II Clustered Wound: No Diabetes, Gout Photos Photo Uploaded By: Alric Quan on 02/19/2015 14:04:54 Wound Measurements Length: (cm) 1 % Reduction i Width: (cm) 1.5 % Reduction i Depth: (cm) 1 Epithelializa Area: (cm) 1.178 Tunneling: Volume: (cm) 1.178 Undermining: n Area: 37.5% n Volume: -212.5% tion: None No No Wound Description Full Thickness Without Classification: Exposed Support Structures Diabetic Severity Grade 1 (Wagner): Wound Margin: Flat and Intact Exudate Amount: Large Exudate Type: Serous Exudate Color: amber Foul Odor After Cleansing: No Wound Bed Granulation Amount: Medium (34-66%) Exposed Structure [name], [place]. ([id number]) Granulation Quality: Red, Pink Fascia Exposed: No Necrotic Amount: Medium (34-66%) Fat Layer Exposed: No Necrotic Quality: Adherent Slough Tendon Exposed: No Muscle Exposed: No Joint Exposed: No Bone Exposed: No Limited to Skin Breakdown Periwound Skin  Texture Texture Color No Abnormalities Noted: No No Abnormalities Noted: No Callus: No Atrophie Blanche: No Crepitus: No Cyanosis: No Excoriation: No Ecchymosis: No Fluctuance: No Erythema: No Friable: No Hemosiderin Staining: No Induration: No Mottled: No Localized Edema: Yes Pallor: No Rash: No Rubor: No Scarring: No Temperature / Pain Moisture Tenderness on Palpation: Yes No Abnormalities Noted: No Dry / Scaly: No Maceration: No Moist: Yes Wound Preparation Ulcer Cleansing: Other: soap and water, Topical Anesthetic Applied: Other: lidocaine 4%, Treatment Notes Wound #3 (Right, Medial Lower Leg) 1. Cleansed with: Clean wound with Normal Saline 2. Anesthetic Topical Lidocaine 4% cream to wound bed prior to debridement 4. Dressing Applied: Aquacel Ag 5. Secondary Dressing Applied Dry Gauze 7. Secured with Recruitment consultant) Signed: 02/19/2015 3:03:16 PM By: Alric Quan Entered By: Alric Quan on 02/19/2015 08:27:38 [name] ([name] ([id number]) -------------------------------------------------------------------------------- Wound Assessment Details Patient  Name: [name], [place] Date of Service: 02/19/2015 8:00 AM Medical Record Number: [id number] Patient Account Number: [id number] Date of Birth/Sex: 11-12-1965 (50 y.o. Male) Treating RN: Ahmed Prima Primary Care Physician: [name], [place] Other Clinician: Referring Physician: [name], [place] Treating Physician/Extender: Frann Rider in Treatment: 7 Wound Status Wound Number: 4 Primary Venous Leg Ulcer Etiology: Wound Location: Right Lower Leg - Distal Wound Open Wounding Event: Gradually Appeared Status: Date Acquired: 12/13/2014 Comorbid Lymphedema, Congestive Heart Weeks Of Treatment: 7 History: Failure, Hypertension, Type II Clustered Wound: No Diabetes, Gout Photos Photo Uploaded By: Alric Quan on 02/19/2015 14:05:00 Wound  Measurements Length: (cm) 1 Width: (cm) 1 Depth: (cm) 0.6 Area: (cm) 0.785 Volume: (cm) 0.471 % Reduction in Area: 48.8% % Reduction in Volume: -53.9% Epithelialization: None Tunneling: No Undermining: No Wound Description Full Thickness Without Classification: Exposed Support Structures Diabetic Severity Grade 1 (Wagner): Wound Margin: Flat and Intact Exudate Amount: Large Exudate Type: Serous Exudate Color: amber Foul Odor After Cleansing: No Wound Bed Granulation Amount: Medium (34-66%) Exposed Structure [name], [place]. ([id number]) Granulation Quality: Red, Pink Fascia Exposed: No Necrotic Amount: Medium (34-66%) Fat Layer Exposed: No Necrotic Quality: Adherent Slough Tendon Exposed: No Muscle Exposed: No Joint Exposed: No Bone Exposed: No Limited to Skin Breakdown Periwound Skin Texture Texture Color No Abnormalities Noted: No No Abnormalities Noted: No Callus: No Atrophie Blanche: No Crepitus: No Cyanosis: No Excoriation: No Ecchymosis: No Fluctuance: No Erythema: No Friable: No Hemosiderin Staining: No Induration: No Mottled: No Localized Edema: Yes Pallor: No Rash: No Rubor: No Scarring: No Temperature / Pain Moisture Temperature: No Abnormality No Abnormalities Noted: No Tenderness on Palpation: Yes Dry / Scaly: No Maceration: No Moist: Yes Wound Preparation Ulcer Cleansing: Rinsed/Irrigated with Saline Topical Anesthetic Applied: Other: lidocaine 4%, Treatment Notes Wound #4 (Right, Distal Lower Leg) 1. Cleansed with: Clean wound with Normal Saline 2. Anesthetic Topical Lidocaine 4% cream to wound bed prior to debridement 4. Dressing Applied: Aquacel Ag 5. Secondary Dressing Applied Dry Gauze 7. Secured with Recruitment consultant) Signed: 02/19/2015 3:03:16 PM By: Alric Quan Entered By: Alric Quan on 02/19/2015 08:28:17 [name] ([name]  ([id number]) -------------------------------------------------------------------------------- [place] Details Patient Name: [name] Date of Service: 02/19/2015 8:00 AM Medical Record Number: [id number] Patient Account Number: [id number] Date of Birth/Sex: December 18, 1965 (49 y.o. Male) Treating RN: Ahmed Prima Primary Care Physician: [name], [place] Other Clinician: Referring Physician: [name], [place] Treating Physician/Extender: Frann Rider in Treatment: 7 Vital Signs Time Taken: 08:13 Temperature (F): 98.2 Height (in): 71 Pulse (bpm): 82 Weight (lbs): 360 Respiratory Rate (breaths/min): 20 Body Mass Index (BMI): 50.2 Blood Pressure (mmHg): 171/101 Reference Range: 80 - 120 mg / dl Electronic Signature(s) Signed: 02/19/2015 3:03:16 PM By: Alric Quan Entered By: Alric Quan on 02/19/2015 08:15:36

## 2015-02-25 ENCOUNTER — Encounter: Payer: Managed Care, Other (non HMO) | Admitting: Surgery

## 2015-02-25 ENCOUNTER — Ambulatory Visit: Payer: Managed Care, Other (non HMO) | Admitting: Family Medicine

## 2015-02-25 DIAGNOSIS — E11622 Type 2 diabetes mellitus with other skin ulcer: Secondary | ICD-10-CM | POA: Diagnosis not present

## 2015-02-26 NOTE — Progress Notes (Signed)
[name], [name] ([id number]) Visit Report for 02/25/2015 Arrival Information Details Patient Name: [name] Date of Service: 02/25/2015 8:00 AM Medical Record Number: [id number] Patient Account Number: [id number] Date of Birth/Sex: Nov 12, 1965 (49 y.o. Male) Treating RN: Montey Hora Primary Care Physician: [name], [place] Other Clinician: Referring Physician: [name], [place] Treating Physician/Extender: Frann Rider in Treatment: 7 Visit Information History Since Last Visit Added or deleted any medications: No Patient Arrived: Ambulatory Any new allergies or adverse reactions: No Arrival Time: 08:07 Had a fall or experienced change in No Accompanied By: self activities of daily living that may affect Transfer Assistance: None risk of falls: Patient Identification Verified: Yes Signs or symptoms of abuse/neglect since last No Secondary Verification Process Yes visito Completed: Hospitalized since last visit: No Patient Requires Transmission-Based No Pain Present Now: No Precautions: Patient Has Alerts: Yes Patient Alerts: DM II Electronic Signature(s) Signed: 02/25/2015 5:07:25 PM By: Montey Hora Entered By: Montey Hora on 02/25/2015 08:08:45 [name] ([id number]) -------------------------------------------------------------------------------- Clinic Level of Care Assessment Details Patient Name: [name] Date of Service: 02/25/2015 8:00 AM Medical Record Number: [id number] Patient Account Number: [id number] Date of Birth/Sex: 1965/10/09 (50 y.o. Male) Treating RN: Montey Hora Primary Care Physician: [name], [place] Other Clinician: Referring Physician: [name], MELINDA Treating Physician/Extender: Frann Rider in Treatment: 7 Clinic Level of Care Assessment Items TOOL 4 Quantity Score [] - Use when only an EandM is performed on FOLLOW-UP visit 0 ASSESSMENTS - Nursing Assessment / Reassessment X - Reassessment of  Co-morbidities (includes updates in patient status) 1 10 X - Reassessment of Adherence to Treatment Plan 1 5 ASSESSMENTS - Wound and Skin Assessment / Reassessment [] - Simple Wound Assessment / Reassessment - one wound 0 X - Complex Wound Assessment / Reassessment - multiple wounds 3 5 [] - Dermatologic / Skin Assessment (not related to wound area) 0 ASSESSMENTS - Focused Assessment X - Circumferential Edema Measurements - multi extremities 1 5 [] - Nutritional Assessment / Counseling / Intervention 0 X - Lower Extremity Assessment (monofilament, tuning fork, pulses) 1 5 [] - Peripheral Arterial Disease Assessment (using hand held doppler) 0 ASSESSMENTS - Ostomy and/or Continence Assessment and Care [] - Incontinence Assessment and Management 0 [] - Ostomy Care Assessment and Management (repouching, etc.) 0 PROCESS - Coordination of Care X - Simple Patient / Family Education for ongoing care 1 15 [] - Complex (extensive) Patient / Family Education for ongoing care 0 [] - Staff obtains Programmer, systems, Records, Test Results / Process Orders 0 [] - Staff telephones HHA, Nursing Homes / Clarify orders / etc 0 [] - Routine Transfer to another Facility (non-emergent condition) 0 [name] ([id number]) [] - Routine Hospital Admission (non-emergent condition) 0 [] - New Admissions / Biomedical engineer / Ordering NPWT, Apligraf, etc. 0 [] - Emergency Hospital Admission (emergent condition) 0 X - Simple Discharge Coordination 1 10 [] - Complex (extensive) Discharge Coordination 0 PROCESS - Special Needs [] - Pediatric / Minor Patient Management 0 [] - Isolation Patient Management 0 [] - Hearing / Language / Visual special needs 0 [] - Assessment of Community assistance (transportation, D/C planning, etc.) 0 [] - Additional assistance / Altered mentation 0 [] - Support Surface(s) Assessment (bed, cushion, seat, etc.) 0 INTERVENTIONS - Wound Cleansing / Measurement [] - Simple Wound  Cleansing - one wound 0 X - Complex Wound Cleansing - multiple wounds 3 5 X - Wound Imaging (photographs - any number of wounds) 1 5 [] - Wound Tracing (  instead of photographs) 0 [] - Simple Wound Measurement - one wound 0 X - Complex Wound Measurement - multiple wounds 3 5 INTERVENTIONS - Wound Dressings X - Small Wound Dressing one or multiple wounds 3 10 [] - Medium Wound Dressing one or multiple wounds 0 [] - Large Wound Dressing one or multiple wounds 0 [] - Application of Medications - topical 0 [] - Application of Medications - injection 0 INTERVENTIONS - Miscellaneous [] - External ear exam 0 [name] ([id number]) [] - Specimen Collection (cultures, biopsies, blood, body fluids, etc.) 0 [] - Specimen(s) / Culture(s) sent or taken to Lab for analysis 0 [] - Patient Transfer (multiple staff / Harrel Lemon Lift / Similar devices) 0 [] - Simple Staple / Suture removal (25 or less) 0 [] - Complex Staple / Suture removal (26 or more) 0 [] - Hypo / Hyperglycemic Management (close monitor of Blood Glucose) 0 [] - Ankle / Brachial Index (ABI) - do not check if billed separately 0 X - Vital Signs 1 5 Has the patient been seen at the hospital within the last three years: Yes Total Score: 135 Level Of Care: New/Established - Level 4 Electronic Signature(s) Signed: 02/25/2015 5:07:25 PM By: Montey Hora Entered By: Montey Hora on 02/25/2015 08:27:21 [name] ([id number]) -------------------------------------------------------------------------------- Encounter Discharge Information Details Patient Name: [name] Date of Service: 02/25/2015 8:00 AM Medical Record Number: [id number] Patient Account Number: [id number] Date of Birth/Sex: 30-Jan-1965 (50 y.o. Male) Treating RN: Montey Hora Primary Care Physician: [name], MELINDA Other Clinician: Referring Physician: [name], MELINDA Treating Physician/Extender: Frann Rider in Treatment: 7 Encounter  Discharge Information Items Discharge Pain Level: 0 Discharge Condition: Stable Ambulatory Status: Ambulatory Discharge Destination: Home Transportation: Private Auto Accompanied By: self Schedule Follow-up Appointment: Yes Medication Reconciliation completed and provided to Patient/Care No [name]: Provided on Clinical Summary of Care: 02/25/2015 Form Type Recipient Paper Patient RT Electronic Signature(s) Signed: 02/25/2015 5:07:25 PM By: Montey Hora Previous Signature: 02/25/2015 8:39:32 AM Version By: Ruthine Dose Entered By: Montey Hora on 02/25/2015 08:40:15 [name] ([id number]) -------------------------------------------------------------------------------- Lower Extremity Assessment Details Patient Name: [name] Date of Service: 02/25/2015 8:00 AM Medical Record Number: [id number] Patient Account Number: [id number] Date of Birth/Sex: 17-Mar-1965 (49 y.o. Male) Treating RN: Montey Hora Primary Care Physician: [name], MELINDA Other Clinician: Referring Physician: [name], MELINDA Treating Physician/Extender: Frann Rider in Treatment: 7 Edema Assessment Assessed: [Left: No] [Right: No] Edema: [Left: Ye] [Right: s] Calf Left: Right: Point of Measurement: 34 cm From Medial Instep cm 49 cm Ankle Left: Right: Point of Measurement: 9 cm From Medial Instep cm 30.6 cm Vascular Assessment Pulses: Posterior Tibial Dorsalis Pedis Palpable: [Right:Yes] Extremity colors, hair growth, and conditions: Extremity Color: [Right:Normal] Hair Growth on Extremity: [Right:No] Temperature of Extremity: [Right:Warm] Capillary Refill: [Right:< 3 seconds] Electronic Signature(s) Signed: 02/25/2015 5:07:25 PM By: Montey Hora Entered By: Montey Hora on 02/25/2015 08:18:56 [name] ([id number]) -------------------------------------------------------------------------------- Multi Wound Chart Details Patient Name: [name] Date  of Service: 02/25/2015 8:00 AM Medical Record Number: [id number] Patient Account Number: [id number] Date of Birth/Sex: [birth year] (50 y.o. Male) Treating RN: Montey Hora Primary Care Physician: [name], [place] Other Clinician: Referring Physician: [name], MELINDA Treating Physician/Extender: Frann Rider in Treatment: 7 Vital Signs Height(in): 71 Pulse(bpm): 84 Weight(lbs): 360 Blood Pressure 140/91 (mmHg): Body Mass Index(BMI): 50 Temperature(F): 98.3 Respiratory Rate 20 (breaths/min): Photos: [2:No Photos] [3:No Photos] [4:No Photos] Wound Location: [2:Right Lower Leg - Lateral, Right Lower Leg - Medial Right Lower  Leg - Distal Superior] Wounding Event: [2:Gradually Appeared] [3:Gradually Appeared] [4:Gradually Appeared] Primary Etiology: [2:Venous Leg Ulcer] [3:Venous Leg Ulcer] [4:Venous Leg Ulcer] Comorbid History: [2:Lymphedema, Congestive Lymphedema, Congestive Lymphedema, Congestive Heart Failure, Hypertension, Type II Diabetes, Gout] [3:Heart Failure, Hypertension, Type II Diabetes, Gout] [4:Heart Failure, Hypertension, Type II Diabetes, Gout] Date Acquired: [2:12/13/2014] [3:12/13/2014] [4:12/13/2014] Weeks of Treatment: [2:7] [3:7] [4:7] Wound Status: [2:Open] [3:Open] [4:Open] Measurements L x W x D 0.4x0.4x1.8 [3:1.1x1x0.9] [4:1.2x0.8x0.7] (cm) Area (cm) : [2:0.126] [3:0.864] [4:0.754] Volume (cm) : [2:0.226] [3:0.778] [4:0.528] % Reduction in Area: [2:83.90%] [3:54.20%] [4:50.80%] % Reduction in Volume: -43.90% [3:-106.40%] [4:-72.50%] Classification: [2:Full Thickness Without Exposed Support Structures] [3:Full Thickness Without Exposed Support Structures] [4:Full Thickness Without Exposed Support Structures] HBO Classification: [2:Grade 1] [3:Grade 1] [4:Grade 1] Exudate Amount: [2:Large] [3:Large] [4:Large] Exudate Type: [2:Serous] [3:Serous] [4:Serous] Exudate Color: [2:amber] [3:amber] [4:amber] Wound Margin: [2:Flat and Intact] [3:Flat and Intact]  [4:Flat and Intact] Granulation Amount: [2:Large (67-100%)] [3:Medium (34-66%)] [4:Medium (34-66%)] Granulation Quality: [2:Pink] [3:Red, Pink] [4:Red, Pink] Necrotic Amount: [2:Small (1-33%)] [3:Medium (34-66%)] [4:Medium (34-66%)] Exposed Structures: MANISH, RUGGIERO ([id number]) Fascia: No Fascia: No Fascia: No Fat: No Fat: No Fat: No Tendon: No Tendon: No Tendon: No Muscle: No Muscle: No Muscle: No Joint: No Joint: No Joint: No Bone: No Bone: No Bone: No Limited to Skin Limited to Skin Limited to Skin Breakdown Breakdown Breakdown Epithelialization: None None None Periwound Skin Texture: Edema: Yes Edema: Yes Edema: Yes Excoriation: No Excoriation: No Induration: No Induration: No Callus: No Callus: No Crepitus: No Crepitus: No Fluctuance: No Fluctuance: No Friable: No Friable: No Rash: No Rash: No Scarring: No Scarring: No Periwound Skin Moist: Yes Moist: Yes Moist: Yes Moisture: Maceration: No Maceration: No Dry/Scaly: No Dry/Scaly: No Periwound Skin Color: No Abnormalities Noted Atrophie Blanche: No Atrophie Blanche: No Cyanosis: No Cyanosis: No Ecchymosis: No Ecchymosis: No Erythema: No Erythema: No Hemosiderin Staining: No Hemosiderin Staining: No Mottled: No Mottled: No Pallor: No Pallor: No Rubor: No Rubor: No Temperature: No Abnormality N/A No Abnormality Tenderness on Yes Yes Yes Palpation: Wound Preparation: Ulcer Cleansing: Ulcer Cleansing: Ulcer Cleansing: Rinsed/Irrigated with Rinsed/Irrigated with Rinsed/Irrigated with Saline Saline Saline Topical Anesthetic Topical Anesthetic Topical Anesthetic Applied: Other: lidocaine Applied: Other: lidocaine Applied: Other: lidocaine 4% 4% 4% Treatment Notes Electronic Signature(s) Signed: 02/25/2015 5:07:25 PM By: Montey Hora Entered By: Montey Hora on 02/25/2015 08:24:41 [name]  ([id number]) -------------------------------------------------------------------------------- [place] Details Patient Name: [name] Date of Service: 02/25/2015 8:00 AM Medical Record Number: [id number] Patient Account Number: [id number] Date of Birth/Sex: 02-Oct-1965 (49 y.o. Male) Treating RN: Montey Hora Primary Care Physician: [name], [place] Other Clinician: Referring Physician: [name], MELINDA Treating Physician/Extender: Frann Rider in Treatment: 7 Active Inactive Abuse / Safety / Falls / Self Care Management Nursing Diagnoses: Potential for falls Goals: Patient will remain injury free Date Initiated: 01/01/2015 Goal Status: Active Patient/caregiver will verbalize/demonstrate measures taken to prevent injury and/or falls Date Initiated: 01/01/2015 Goal Status: Active Interventions: Assess fall risk on admission and as needed Notes: Nutrition Nursing Diagnoses: Imbalanced nutrition Goals: Patient/caregiver agrees to and verbalizes understanding of need to use nutritional supplements and/or vitamins as prescribed Date Initiated: 01/01/2015 Goal Status: Active Patient/caregiver verbalizes understanding of need to maintain therapeutic glucose control per primary care physician Date Initiated: 01/01/2015 Goal Status: Active Patient/caregiver will maintain therapeutic glucose control Date Initiated: 01/01/2015 Goal Status: Active Interventions: FAYETTE, HAMADA ([id number]) Provide education on elevated blood sugars and impact on wound healing Provide education on nutrition Treatment Activities: Education  provided on Nutrition : 01/08/2015 Notes: Orientation to the Wound Care Program Nursing Diagnoses: Knowledge deficit related to the wound healing center program Goals: Patient/caregiver will verbalize understanding of the [place] Program Date Initiated: 01/01/2015 Goal Status: Active Interventions: Provide education  on orientation to the wound center Notes: Pain, Acute or Chronic Nursing Diagnoses: Pain, acute or chronic: actual or potential Goals: Patient will verbalize adequate pain control and receive pain control interventions during procedures as needed Date Initiated: 01/01/2015 Goal Status: Active Patient/caregiver will verbalize adequate pain control between visits Date Initiated: 01/01/2015 Goal Status: Active Patient/caregiver will verbalize comfort level met Date Initiated: 01/01/2015 Goal Status: Active Interventions: Assess comfort goal upon admission Complete pain assessment as per visit requirements Notes: Wound/Skin Impairment DEKLIN, BIELER ([id number]) Nursing Diagnoses: Impaired tissue integrity Knowledge deficit related to smoking impact on wound healing Knowledge deficit related to ulceration/compromised skin integrity Goals: Patient will demonstrate a reduced rate of smoking or cessation of smoking Date Initiated: 01/01/2015 Goal Status: Active Patient/caregiver will verbalize understanding of skin care regimen Date Initiated: 01/01/2015 Goal Status: Active Ulcer/skin breakdown will have a volume reduction of 30% by week 4 Date Initiated: 01/01/2015 Goal Status: Active Ulcer/skin breakdown will have a volume reduction of 50% by week 8 Date Initiated: 01/01/2015 Goal Status: Active Ulcer/skin breakdown will have a volume reduction of 80% by week 12 Date Initiated: 01/01/2015 Goal Status: Active Interventions: Assess patient/caregiver ability to obtain necessary supplies Assess patient/caregiver ability to perform ulcer/skin care regimen upon admission and as needed Notes: Electronic Signature(s) Signed: 02/25/2015 5:07:25 PM By: Montey Hora Entered By: Montey Hora on 02/25/2015 08:24:34 [name] ([id number]) -------------------------------------------------------------------------------- Patient/Caregiver Education Details Patient Name: [name] Date of Service: 02/25/2015 8:00 AM Medical Record Number: [id number] Patient Account Number: [id number] Date of Birth/Gender: May 08, 1965 (50 y.o. Male) Treating RN: Montey Hora Primary Care Physician: [name], [place] Other Clinician: Referring Physician: [name], [place] Treating Physician/Extender: Frann Rider in Treatment: 7 Education Assessment Education Provided To: Patient Education Topics Provided Venous: Handouts: Other: compression hose to continue as ordered Methods: Demonstration, Explain/Verbal Responses: State content correctly Wound/Skin Impairment: Handouts: Other: wound care to continue as ordered Methods: Demonstration, Explain/Verbal Responses: State content correctly Electronic Signature(s) Signed: 02/25/2015 5:07:25 PM By: Montey Hora Entered By: Montey Hora on 02/25/2015 08:40:48 [name] ([id number]) -------------------------------------------------------------------------------- Wound Assessment Details Patient Name: [name] Date of Service: 02/25/2015 8:00 AM Medical Record Number: [id number] Patient Account Number: [id number] Date of Birth/Sex: 06-Aug-1965 (50 y.o. Male) Treating RN: Montey Hora Primary Care Physician: [name], [place] Other Clinician: Referring Physician: [name], MELINDA Treating Physician/Extender: Frann Rider in Treatment: 7 Wound Status Wound Number: 2 Primary Venous Leg Ulcer Etiology: Wound Location: Right Lower Leg - Lateral, Superior Wound Open Status: Wounding Event: Gradually Appeared Comorbid Lymphedema, Congestive Heart Date Acquired: 12/13/2014 History: Failure, Hypertension, Type II Weeks Of Treatment: 7 Diabetes, Gout Clustered Wound: No Photos Photo Uploaded By: Montey Hora on 02/25/2015 09:27:35 Wound Measurements Length: (cm) 0.4 Width: (cm) 0.4 Depth: (cm) 1.8 Area: (cm) 0.126 Volume: (cm) 0.226 % Reduction in Area: 83.9% % Reduction in Volume:  -43.9% Epithelialization: None Tunneling: No Undermining: No Wound Description Full Thickness Without Classification: Exposed Support Structures Diabetic Severity Grade 1 (Wagner): Wound Margin: Flat and Intact Exudate Amount: Large Exudate Type: Serous Exudate Color: amber Foul Odor After Cleansing: No Wound Bed Granulation Amount: Large (67-100%) Exposed Structure MALAKI, KOURY. ([id number]) Granulation Quality: Pink Fascia Exposed: No Necrotic Amount: Small (1-33%) Fat Layer Exposed: No Necrotic  Quality: Adherent Slough Tendon Exposed: No Muscle Exposed: No Joint Exposed: No Bone Exposed: No Limited to Skin Breakdown Periwound Skin Texture Texture Color No Abnormalities Noted: No No Abnormalities Noted: No Localized Edema: Yes Temperature / Pain Moisture Temperature: No Abnormality No Abnormalities Noted: No Tenderness on Palpation: Yes Moist: Yes Wound Preparation Ulcer Cleansing: Rinsed/Irrigated with Saline Topical Anesthetic Applied: Other: lidocaine 4%, Treatment Notes Wound #2 (Right, Lateral, Superior Lower Leg) 1. Cleansed with: Clean wound with Normal Saline 4. Dressing Applied: Aquacel Ag 5. Secondary Dressing Applied Dry Gauze 7. Secured with Recruitment consultant) Signed: 02/25/2015 5:07:25 PM By: Montey Hora Entered By: Montey Hora on 02/25/2015 08:23:46 [name] ([id number]) -------------------------------------------------------------------------------- Wound Assessment Details Patient Name: [name] Date of Service: 02/25/2015 8:00 AM Medical Record Number: [id number] Patient Account Number: [id number] Date of Birth/Sex: 27-Dec-1965 (50 y.o. Male) Treating RN: Montey Hora Primary Care Physician: [name], [place] Other Clinician: Referring Physician: [name], MELINDA Treating Physician/Extender: Frann Rider in Treatment: 7 Wound Status Wound Number: 3 Primary Venous Leg Ulcer Etiology: Wound  Location: Right Lower Leg - Medial Wound Open Wounding Event: Gradually Appeared Status: Date Acquired: 12/13/2014 Comorbid Lymphedema, Congestive Heart Weeks Of Treatment: 7 History: Failure, Hypertension, Type II Clustered Wound: No Diabetes, Gout Photos Photo Uploaded By: Montey Hora on 02/25/2015 09:27:18 Wound Measurements Length: (cm) 1.1 % Reduction i Width: (cm) 1 % Reduction i Depth: (cm) 0.9 Epithelializa Area: (cm) 0.864 Tunneling: Volume: (cm) 0.778 Undermining: n Area: 54.2% n Volume: -106.4% tion: None No No Wound Description Full Thickness Without Classification: Exposed Support Structures Diabetic Severity Grade 1 (Wagner): Wound Margin: Flat and Intact Exudate Amount: Large Exudate Type: Serous Exudate Color: amber Foul Odor After Cleansing: No Wound Bed Granulation Amount: Medium (34-66%) Exposed Structure SCHYLAR, WUEBKER. ([id number]) Granulation Quality: Red, Pink Fascia Exposed: No Necrotic Amount: Medium (34-66%) Fat Layer Exposed: No Necrotic Quality: Adherent Slough Tendon Exposed: No Muscle Exposed: No Joint Exposed: No Bone Exposed: No Limited to Skin Breakdown Periwound Skin Texture Texture Color No Abnormalities Noted: No No Abnormalities Noted: No Callus: No Atrophie Blanche: No Crepitus: No Cyanosis: No Excoriation: No Ecchymosis: No Fluctuance: No Erythema: No Friable: No Hemosiderin Staining: No Induration: No Mottled: No Localized Edema: Yes Pallor: No Rash: No Rubor: No Scarring: No Temperature / Pain Moisture Tenderness on Palpation: Yes No Abnormalities Noted: No Dry / Scaly: No Maceration: No Moist: Yes Wound Preparation Ulcer Cleansing: Rinsed/Irrigated with Saline Topical Anesthetic Applied: Other: lidocaine 4%, Treatment Notes Wound #3 (Right, Medial Lower Leg) 1. Cleansed with: Clean wound with Normal Saline 4. Dressing Applied: Aquacel Ag 5. Secondary Dressing Applied Dry Gauze 7.  Secured with Recruitment consultant) Signed: 02/25/2015 5:07:25 PM By: Montey Hora Entered By: Montey Hora on 02/25/2015 08:24:04 [name] ([id number]) -------------------------------------------------------------------------------- Wound Assessment Details Patient Name: [name] Date of Service: 02/25/2015 8:00 AM Medical Record Number: [id number] Patient Account Number: [id number] Date of Birth/Sex: 05-29-1965 (50 y.o. Male) Treating RN: Montey Hora Primary Care Physician: [name], [place] Other Clinician: Referring Physician: [name], MELINDA Treating Physician/Extender: Frann Rider in Treatment: 7 Wound Status Wound Number: 4 Primary Venous Leg Ulcer Etiology: Wound Location: Right Lower Leg - Distal Wound Open Wounding Event: Gradually Appeared Status: Date Acquired: 12/13/2014 Comorbid Lymphedema, Congestive Heart Weeks Of Treatment: 7 History: Failure, Hypertension, Type II Clustered Wound: No Diabetes, Gout Photos Photo Uploaded By: Montey Hora on 02/25/2015 09:27:19 Wound Measurements Length: (cm) 1.2 Width: (cm) 0.8 Depth: (cm) 0.7 Area: (cm) 0.754 Volume: (cm) 0.528 %  Reduction in Area: 50.8% % Reduction in Volume: -72.5% Epithelialization: None Tunneling: No Undermining: No Wound Description Full Thickness Without Classification: Exposed Support Structures Diabetic Severity Grade 1 (Wagner): Wound Margin: Flat and Intact Exudate Amount: Large Exudate Type: Serous Exudate Color: amber Foul Odor After Cleansing: No Wound Bed Granulation Amount: Medium (34-66%) Exposed Structure CHELSEY, KIMBERLEY. ([id number]) Granulation Quality: Red, Pink Fascia Exposed: No Necrotic Amount: Medium (34-66%) Fat Layer Exposed: No Necrotic Quality: Adherent Slough Tendon Exposed: No Muscle Exposed: No Joint Exposed: No Bone Exposed: No Limited to Skin Breakdown Periwound Skin Texture Texture Color No Abnormalities  Noted: No No Abnormalities Noted: No Callus: No Atrophie Blanche: No Crepitus: No Cyanosis: No Excoriation: No Ecchymosis: No Fluctuance: No Erythema: No Friable: No Hemosiderin Staining: No Induration: No Mottled: No Localized Edema: Yes Pallor: No Rash: No Rubor: No Scarring: No Temperature / Pain Moisture Temperature: No Abnormality No Abnormalities Noted: No Tenderness on Palpation: Yes Dry / Scaly: No Maceration: No Moist: Yes Wound Preparation Ulcer Cleansing: Rinsed/Irrigated with Saline Topical Anesthetic Applied: Other: lidocaine 4%, Treatment Notes Wound #4 (Right, Distal Lower Leg) 1. Cleansed with: Clean wound with Normal Saline 4. Dressing Applied: Aquacel Ag 5. Secondary Dressing Applied Dry Gauze 7. Secured with Recruitment consultant) Signed: 02/25/2015 5:07:25 PM By: Montey Hora Entered By: Montey Hora on 02/25/2015 76:28:31 DVVOHYW, VPXTGG Y. ([id number]) -------------------------------------------------------------------------------- [place] Details Patient Name: [name] Date of Service: 02/25/2015 8:00 AM Medical Record Number: [id number] Patient Account Number: [id number] Date of Birth/Sex: 20-Oct-1965 (50 y.o. Male) Treating RN: Montey Hora Primary Care Physician: [name], [place] Other Clinician: Referring Physician: [name], [place] Treating Physician/Extender: Frann Rider in Treatment: 7 Vital Signs Time Taken: 08:09 Temperature (F): 98.3 Height (in): 71 Pulse (bpm): 84 Weight (lbs): 360 Respiratory Rate (breaths/min): 20 Body Mass Index (BMI): 50.2 Blood Pressure (mmHg): 140/91 Reference Range: 80 - 120 mg / dl Electronic Signature(s) Signed: 02/25/2015 5:07:25 PM By: Montey Hora Entered By: Montey Hora on 02/25/2015 08:11:27

## 2015-02-26 NOTE — Progress Notes (Signed)
[name], [name] ([id number]) Visit Report for 02/25/2015 Chief Complaint Document Details Patient Name: [name], [name] Date of Service: 02/25/2015 8:00 AM Medical Record Number: [id number] Patient Account Number: [id number] Date of Birth/Sex: Jan 21, 1966 (49 y.o. Male) Treating RN: [name] Primary Care Physician: [name], [name] Other Clinician: Referring Physician: [name], [name] Treating Physician/Extender: [name] in Treatment: 7 Information Obtained from: Patient Chief Complaint Patients presents for treatment of an open diabetic ulcer over the right lower extremity with swelling of both legs for about 1 month. Electronic Signature(s) Signed: 02/25/2015 8:39:23 AM By: Darren Fudge MD, FACS Entered By: [name] on 02/25/2015 08:39:23 [name] ([id number]) -------------------------------------------------------------------------------- HPI Details Patient Name: [name] Date of Service: 02/25/2015 8:00 AM Medical Record Number: [id number] Patient Account Number: [id number] Date of Birth/Sex: 04/15/1965 (50 y.o. Male) Treating RN: [name] Primary Care Physician: [name], [name] Other Clinician: Referring Physician: [name], [name] Treating Physician/Extender: [name] in Treatment: 7 History of Present Illness Location: Started getting pustules and boils on his right lower extremity along with swelling and was treated for this Quality: Patient reports experiencing a dull pain to affected area(s). Severity: Patient states wound are getting worse. Duration: Patient has had the wound for < 4 weeks prior to presenting for treatment Timing: Pain in wound is Intermittent (comes and goes Context: The wound appeared gradually over time Modifying Factors: Other treatment(s) tried include: local care and antibiotics and some compression stockings. Associated Signs and Symptoms: Patient reports having difficulty standing for long  periods. HPI Description: 50 year old gentleman who has type 2 diabetes mellitus and morbid obesity has been having cellulitis and abscess of the right lower leg for the last 3 weeks. He has had multiple antibiotics and an IandD and over the last 10 days has been on Septra DS and doxycycline and prior to that Keflex. the patient is a poor historian and does not give a definite timeline but says he's been diagnosed with diabetes mellitus for about 8-10 months and has been on insulin. He does not measure his blood sugars regularly nor does he see his PCP regularly. Also has congestive heart failure for which he sees a [facility]ologist at [name] but this is going to change as the cardiologist as retired. He is a smoker and smokes several cigars a day and drinks alcohol occasionally. He has been morbidly obese for several years. 01/08/2015 -- the patient complains about having to use his 3 layer compression wrap and has multiple issues with this none of them being medically oriented. He also continues to smoke, has not yet seen his cardiologist or his PCP and in general has continued to not look after himself. 01/15/2015 -- the patient did not allow [place] to put his 3 layer compression wrap last week and has been using his own compression stockings. Continues to smoke. He did see his cardiologist and they have increased his dose of Lasix, asked him to repeat an echo, lose weight and be compliant with his diabetic control. 01/29/2015 -- he has given up smoking for about the last week. He is unable to keep his appointment on 01/30/2015 with the vascular surgeons and has rescheduled it for next week. 02/12/2015 -- he continues to have a lot of edema and drains from the wounds. However he is not agreeable to wear 3 or 4 layer compression wraps. He wants to wear his 20-30 mm compression stockings only. 02/19/2015 -- he was seen by the vascular surgeon [name] on 02/18/2015 and a  duplex ultrasound  of the lower extremities shows normal deep venous system, superficial reflux is not present in the great saphenous veins bilaterally and the left small saphenous vein. There is reflux in the small saphenous vein on the right and there are several sporadic varicosities and the distal thigh on the right. He was not offered surgery but graduated compression stockings class I were recommended on a daily basis and was given a [name]. ([id number]) prescription for this. He would follow-up with them in 3 months for possible using of lymph pumps at that time. Electronic Signature(s) Signed: 02/25/2015 8:39:27 AM By: Darren Fudge MD, FACS Entered By: [name] on 02/25/2015 08:39:27 [name] ([id number]) -------------------------------------------------------------------------------- Physical Exam Details Patient Name: [name] Date of Service: 02/25/2015 8:00 AM Medical Record Number: [id number] Patient Account Number: [id number] Date of Birth/Sex: [birth year] (49 y.o. Male) Treating RN: [name] Primary Care Physician: [name], [name] Other Clinician: Referring Physician: [name], [name] Treating Physician/Extender: [name] in Treatment: 7 Constitutional . Pulse regular. Respirations normal and unlabored. Afebrile. . Eyes Nonicteric. Reactive to light. Ears, Nose, Mouth, and Throat Lips, teeth, and gums WNL.[name] Kitchen Moist mucosa without lesions. Neck supple and nontender. No palpable supraclavicular or cervical adenopathy. Normal sized without goiter. Respiratory WNL. No retractions.. Cardiovascular Pedal Pulses WNL. No clubbing, cyanosis or edema. Chest Breasts symmetical and no nipple discharge.. Breast tissue WNL, no masses, lumps, or tenderness.. Lymphatic No adneopathy. No adenopathy. No adenopathy. Musculoskeletal Adexa without tenderness or enlargement.. Digits and nails w/o clubbing, cyanosis, infection, petechiae, ischemia, or  inflammatory conditions.. Integumentary (Hair, Skin) No suspicious lesions. No crepitus or fluctuance. No peri-wound warmth or erythema. No masses.[name] Kitchen Psychiatric Judgement and insight Intact.. No evidence of depression, anxiety, or agitation.. Notes overall there is significant improvement in all his wounds and the depth and the debris is much less and no sharp debridement was required Electronic Signature(s) Signed: 02/25/2015 8:39:52 AM By: Darren Fudge MD, FACS Entered By: [name] on 02/25/2015 08:39:52 [name] ([id number]) -------------------------------------------------------------------------------- Physician Orders Details Patient Name: [name] Date of Service: 02/25/2015 8:00 AM Medical Record Number: [id number] Patient Account Number: [id number] Date of Birth/Sex: 10-23-1965 (50 y.o. Male) Treating RN: [name] Primary Care Physician: [name], Wilber Other Clinician: Referring Physician: [name], [name] Treating Physician/Extender: [name] in Treatment: 7 Verbal / Phone Orders: Yes Clinician: Montey Fitzgerald Read Back and Verified: Yes Diagnosis Coding Wound Cleansing Wound #2 Right,Lateral,Superior Lower Leg o Cleanse wound with mild soap and water o May Shower, gently pat wound dry prior to applying new dressing. Wound #3 Right,Medial Lower Leg o Cleanse wound with mild soap and water o May Shower, gently pat wound dry prior to applying new dressing. Wound #4 Right,Distal Lower Leg o Cleanse wound with mild soap and water o May Shower, gently pat wound dry prior to applying new dressing. Anesthetic Wound #2 Right,Lateral,Superior Lower Leg o Topical Lidocaine 4% cream applied to wound bed prior to debridement Wound #3 Right,Medial Lower Leg o Topical Lidocaine 4% cream applied to wound bed prior to debridement Wound #4 Right,Distal Lower Leg o Topical Lidocaine 4% cream applied to wound bed prior to  debridement Primary Wound Dressing Wound #2 Right,Lateral,Superior Lower Leg o Aquacel Ag Wound #3 Right,Medial Lower Leg o Aquacel Ag Wound #4 Right,Distal Lower Leg o Aquacel Ag Secondary Dressing Wound #2 Right,Lateral,Superior Lower Leg o Dry Gauze - tape [name], [name]. ([id number]) Wound #3 Right,Medial Lower Leg o Dry Gauze - tape Wound #4 Right,Distal  Lower Leg o Dry Gauze - tape Dressing Change Frequency Wound #2 Right,Lateral,Superior Lower Leg o Change dressing every other day. Wound #3 Right,Medial Lower Leg o Change dressing every other day. Wound #4 Right,Distal Lower Leg o Change dressing every other day. Follow-up Appointments Wound #2 Right,Lateral,Superior Lower Leg o Return Appointment in 1 week. Wound #3 Right,Medial Lower Leg o Return Appointment in 1 week. Wound #4 Right,Distal Lower Leg o Return Appointment in 1 week. Edema Control Wound #2 Right,Lateral,Superior Lower Leg o Patient to wear own compression stockings o Elevate legs to the level of the heart and pump ankles as often as possible Wound #3 Right,Medial Lower Leg o Patient to wear own compression stockings o Elevate legs to the level of the heart and pump ankles as often as possible Wound #4 Right,Distal Lower Leg o Patient to wear own compression stockings o Elevate legs to the level of the heart and pump ankles as often as possible Electronic Signature(s) Signed: 02/25/2015 4:26:31 PM By: [facility]ren Fudge MD, FACS Signed: 02/25/2015 5:07:25 PM By: [name] Entered By: [name] on 02/25/2015 08:26:37 [name] ([id number]) -------------------------------------------------------------------------------- Problem List Details Patient Name: [name] Date of Service: 02/25/2015 8:00 AM Medical Record Number: [id number] Patient Account Number: [id number] Date of Birth/Sex: 07/09/1965 (49 y.o. Male) Treating RN: [name] Primary Care Physician: [name], [name] Other Clinician: Referring Physician: [name], [name] Treating Physician/Extender: [name] in Treatment: 7 Active Problems ICD-10 Encounter Code Description Active Date Diagnosis E11.622 Type 2 diabetes mellitus with other skin ulcer 01/01/2015 Yes E66.01 Morbid (severe) obesity due to excess calories 01/01/2015 Yes L97.212 Non-pressure chronic ulcer of right calf with fat layer 02/05/2015 Yes exposed I89.0 Lymphedema, not elsewhere classified 01/01/2015 Yes I50.40 Unspecified combined systolic (congestive) and diastolic 99991111 Yes (congestive) heart failure F17.218 Nicotine dependence, cigarettes, with other nicotine- 01/01/2015 Yes induced disorders 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 02/19/2015 Yes Inactive Problems Resolved Problems ICD-10 Code Description Active Date Resolved Date L02.415 Cutaneous abscess of right lower limb 01/01/2015 01/01/2015 [name] ([id number]) Electronic Signature(s) Signed: 02/25/2015 8:39:17 AM By: Darren Fudge MD, FACS Entered By: [name] on 02/25/2015 08:39:17 [name] ([id number]) -------------------------------------------------------------------------------- Progress Note Details Patient Name: [name] Date of Service: 02/25/2015 8:00 AM Medical Record Number: [id number] Patient Account Number: [id number] Date of Birth/Sex: [birth year] (50 y.o. Male) Treating RN: [name] Primary Care Physician: [name], [name] Other Clinician: Referring Physician: [name], [name] Treating Physician/Extender: [name] in Treatment: 7 Subjective Chief Complaint Information obtained from Patient Patients presents for treatment of an open diabetic ulcer over the right lower extremity with swelling of both legs for about 1 month. History of Present Illness (HPI) The following HPI elements were documented for the patient's  wound: Location: Started getting pustules and boils on his right lower extremity along with swelling and was treated for this Quality: Patient reports experiencing a dull pain to affected area(s). Severity: Patient states wound are getting worse. Duration: Patient has had the wound for < 4 weeks prior to presenting for treatment Timing: Pain in wound is Intermittent (comes and goes Context: The wound appeared gradually over time Modifying Factors: Other treatment(s) tried include: local care and antibiotics and some compression stockings. Associated Signs and Symptoms: Patient reports having difficulty standing for long periods. 50 year old gentleman who has type 2 diabetes mellitus and morbid obesity has been having cellulitis and abscess of the right lower leg for the last 3 weeks. He has  had multiple antibiotics and an IandD and over the last 10 days has been on Septra DS and doxycycline and prior to that Keflex. the patient is a poor historian and does not give a definite timeline but says he's been diagnosed with diabetes mellitus for about 8-10 months and has been on insulin. He does not measure his blood sugars regularly nor does he see his PCP regularly. Also has congestive heart failure for which he sees a cardiologist at [facility] but this is going to change as the cardiologist as retired. He is a smoker and smokes several cigars a day and drinks alcohol occasionally. He has been morbidly obese for several years. 01/08/2015 -- the patient complains about having to use his 3 layer compression wrap and has multiple issues with this none of them being medically oriented. He also continues to smoke, has not yet seen his cardiologist or his PCP and in general has continued to not look after himself. 01/15/2015 -- the patient did not allow [place] to put his 3 layer compression wrap last week and has been using his own compression stockings. Continues to smoke. He did see his cardiologist  and they have increased his dose of Lasix, asked him to repeat an echo, lose weight and be compliant with his diabetic control. 01/29/2015 -- he has given up smoking for about the last week. He is unable to keep his appointment on 01/30/2015 with the vascular surgeons and has rescheduled it for next week. 02/12/2015 -- he continues to have a lot of edema and drains from the wounds. However he is not [name], [name]. ([id number]) agreeable to wear 3 or 4 layer compression wraps. He wants to wear his 20-30 mm compression stockings only. 02/19/2015 -- he was seen by the vascular surgeon [name] on 02/18/2015 and a duplex ultrasound of the lower extremities shows normal deep venous system, superficial reflux is not present in the great saphenous veins bilaterally and the left small saphenous vein. There is reflux in the small saphenous vein on the right and there are several sporadic varicosities and the distal thigh on the right. He was not offered surgery but graduated compression stockings class I were recommended on a daily basis and was given a prescription for this. He would follow-up with them in 3 months for possible using of lymph pumps at that time. Objective Constitutional Pulse regular. Respirations normal and unlabored. Afebrile. Vitals Time Taken: 8:09 AM, Height: 71 in, Weight: 360 lbs, BMI: 50.2, Temperature: 98.3 F, Pulse: 84 bpm, Respiratory Rate: 20 breaths/min, Blood Pressure: 140/91 mmHg. Eyes Nonicteric. Reactive to light. Ears, Nose, Mouth, and Throat Lips, teeth, and gums WNL.[name] Kitchen Moist mucosa without lesions. Neck supple and nontender. No palpable supraclavicular or cervical adenopathy. Normal sized without goiter. Respiratory WNL. No retractions.. Cardiovascular Pedal Pulses WNL. No clubbing, cyanosis or edema. Chest Breasts symmetical and no nipple discharge.. Breast tissue WNL, no masses, lumps, or tenderness.. Lymphatic No adneopathy. No adenopathy. No  adenopathy. Musculoskeletal Adexa without tenderness or enlargement.. Digits and nails w/o clubbing, cyanosis, infection, petechiae, ischemia, or inflammatory conditions.[name] Kitchen [name], [name] ([id number]) Psychiatric Judgement and insight Intact.. No evidence of depression, anxiety, or agitation.. General Notes: overall there is significant improvement in all his wounds and the depth and the debris is much less and no sharp debridement was required Integumentary (Hair, Skin) No suspicious lesions. No crepitus or fluctuance. No peri-wound warmth or erythema. No masses.. Wound #2 status is Open. Original cause of wound was Gradually Appeared. The  wound is located on the Right,Lateral,Superior Lower Leg. The wound measures 0.4cm length x 0.4cm width x 1.8cm depth; 0.126cm^2 area and 0.226cm^3 volume. The wound is limited to skin breakdown. There is no tunneling or undermining noted. There is a large amount of serous drainage noted. The wound margin is flat and intact. There is large (67-100%) pink granulation within the wound bed. There is a small (1-33%) amount of necrotic tissue within the wound bed including Adherent Slough. The periwound skin appearance exhibited: Localized Edema, Moist. Periwound temperature was noted as No Abnormality. The periwound has tenderness on palpation. Wound #3 status is Open. Original cause of wound was Gradually Appeared. The wound is located on the Right,Medial Lower Leg. The wound measures 1.1cm length x 1cm width x 0.9cm depth; 0.864cm^2 area and 0.778cm^3 volume. The wound is limited to skin breakdown. There is no tunneling or undermining noted. There is a large amount of serous drainage noted. The wound margin is flat and intact. There is medium (34-66%) red, pink granulation within the wound bed. There is a medium (34-66%) amount of necrotic tissue within the wound bed including Adherent Slough. The periwound skin appearance exhibited: Localized Edema,  Moist. The periwound skin appearance did not exhibit: Callus, Crepitus, Excoriation, Fluctuance, Friable, Induration, Rash, Scarring, Dry/Scaly, Maceration, Atrophie Blanche, Cyanosis, Ecchymosis, Hemosiderin Staining, Mottled, Pallor, Rubor, Erythema. The periwound has tenderness on palpation. Wound #4 status is Open. Original cause of wound was Gradually Appeared. The wound is located on the Right,Distal Lower Leg. The wound measures 1.2cm length x 0.8cm width x 0.7cm depth; 0.754cm^2 area and 0.528cm^3 volume. The wound is limited to skin breakdown. There is no tunneling or undermining noted. There is a large amount of serous drainage noted. The wound margin is flat and intact. There is medium (34-66%) red, pink granulation within the wound bed. There is a medium (34-66%) amount of necrotic tissue within the wound bed including Adherent Slough. The periwound skin appearance exhibited: Localized Edema, Moist. The periwound skin appearance did not exhibit: Callus, Crepitus, Excoriation, Fluctuance, Friable, Induration, Rash, Scarring, Dry/Scaly, Maceration, Atrophie Blanche, Cyanosis, Ecchymosis, Hemosiderin Staining, Mottled, Pallor, Rubor, Erythema. Periwound temperature was noted as No Abnormality. The periwound has tenderness on palpation. Assessment Active Problems ICD-10 E11.622 - Type 2 diabetes mellitus with other skin ulcer [name], [name]. ([id number]) E66.01 - Morbid (severe) obesity due to excess calories L97.212 - Non-pressure chronic ulcer of right calf with fat layer exposed I89.0 - Lymphedema, not elsewhere classified I50.40 - Unspecified combined systolic (congestive) and diastolic (congestive) heart failure F17.218 - Nicotine dependence, cigarettes, with other nicotine-induced disorders I83.012 - 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 Plan Wound Cleansing: Wound #2 Right,Lateral,Superior Lower Leg: Cleanse wound with mild soap and water May Shower,  gently pat wound dry prior to applying new dressing. Wound #3 Right,Medial Lower Leg: Cleanse wound with mild soap and water May Shower, gently pat wound dry prior to applying new dressing. Wound #4 Right,Distal Lower Leg: Cleanse wound with mild soap and water May Shower, gently pat wound dry prior to applying new dressing. Anesthetic: Wound #2 Right,Lateral,Superior Lower Leg: Topical Lidocaine 4% cream applied to wound bed prior to debridement Wound #3 Right,Medial Lower Leg: Topical Lidocaine 4% cream applied to wound bed prior to debridement Wound #4 Right,Distal Lower Leg: Topical Lidocaine 4% cream applied to wound bed prior to debridement Primary Wound Dressing: Wound #2 Right,Lateral,Superior Lower Leg: Aquacel Ag Wound #3 Right,Medial Lower Leg: Aquacel Ag Wound #4 Right,Distal Lower Leg:  Aquacel Ag Secondary Dressing: Wound #2 Right,Lateral,Superior Lower Leg: Dry Gauze - tape Wound #3 Right,Medial Lower Leg: Dry Gauze - tape Wound #4 Right,Distal Lower Leg: Dry Gauze - tape Dressing Change Frequency: Wound #2 Right,Lateral,Superior Lower Leg: Change dressing every other day. Wound #3 Right,Medial Lower Leg: Change dressing every other day. [name], [name] ([id number]) Wound #4 Right,Distal Lower Leg: Change dressing every other day. Follow-up Appointments: Wound #2 Right,Lateral,Superior Lower Leg: Return Appointment in 1 week. Wound #3 Right,Medial Lower Leg: Return Appointment in 1 week. Wound #4 Right,Distal Lower Leg: Return Appointment in 1 week. Edema Control: Wound #2 Right,Lateral,Superior Lower Leg: Patient to wear own compression stockings Elevate legs to the level of the heart and pump ankles as often as possible Wound #3 Right,Medial Lower Leg: Patient to wear own compression stockings Elevate legs to the level of the heart and pump ankles as often as possible Wound #4 Right,Distal Lower Leg: Patient to wear own compression  stockings Elevate legs to the level of the heart and pump ankles as often as possible I have discussed with him the need to wear compression stockings at all times from morning to night and he is agreeable about this. He will not agree to compression wraps to be applied to his legs. I have asked him to buy a spare pair of compression stockings of the 20-30 mm variety. He will see [place] back next week. Electronic Signature(s) Signed: 02/25/2015 8:40:44 AM By: [facility]ren Fudge MD, FACS Entered By: [name] on 02/25/2015 08:40:44 [name] ([id number]) -------------------------------------------------------------------------------- SuperBill Details Patient Name: [name] Date of Service: 02/25/2015 Medical Record Number: [id number] Patient Account Number: [id number] Date of Birth/Sex: 1965-07-19 (49 y.o. Male) Treating RN: [name] Primary Care Physician: [name], [place] Other Clinician: Referring Physician: [name], [name] Treating Physician/Extender: [name] in Treatment: 7 Diagnosis Coding ICD-10 Codes Code Description E11.622 Type 2 diabetes mellitus with other skin ulcer E66.01 Morbid (severe) obesity due to excess calories L97.212 Non-pressure chronic ulcer of right calf with fat layer exposed I89.0 Lymphedema, not elsewhere classified I50.40 Unspecified combined systolic (congestive) and diastolic (congestive) heart failure F17.218 Nicotine dependence, cigarettes, with other nicotine-induced disorders 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 Facility Procedures CPT4 Code: [id number] Description: 99214 - WOUND CARE VISIT-LEV 4 EST PT Modifier: Quantity: 1 Physician Procedures CPT4 Code: [id number] Description: 99213 - WC PHYS LEVEL 3 - EST PT ICD-10 Description Diagnosis E11.622 Type 2 diabetes mellitus with other skin ulcer I89.0 Lymphedema, not elsewhere classified I83.012 Varicose veins of right lower extremity with ulce  L97.212 Non-pressure  chronic ulcer of right calf with fat Modifier: r of calf layer exposed Quantity: 1 Electronic Signature(s) Signed: 02/25/2015 8:41:23 AM By: [facility]ren Fudge MD, FACS Entered By: [name] on 02/25/2015 08:41:23

## 2015-02-27 ENCOUNTER — Ambulatory Visit: Payer: Managed Care, Other (non HMO) | Admitting: Family Medicine

## 2015-03-04 ENCOUNTER — Encounter: Payer: Managed Care, Other (non HMO) | Attending: Surgery | Admitting: Surgery

## 2015-03-04 DIAGNOSIS — I83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: Secondary | ICD-10-CM | POA: Insufficient documentation

## 2015-03-04 DIAGNOSIS — I110 Hypertensive heart disease with heart failure: Secondary | ICD-10-CM | POA: Insufficient documentation

## 2015-03-04 DIAGNOSIS — I890 Lymphedema, not elsewhere classified: Secondary | ICD-10-CM | POA: Diagnosis not present

## 2015-03-04 DIAGNOSIS — I5040 Unspecified combined systolic (congestive) and diastolic (congestive) heart failure: Secondary | ICD-10-CM | POA: Insufficient documentation

## 2015-03-04 DIAGNOSIS — Z6841 Body Mass Index (BMI) 40.0 and over, adult: Secondary | ICD-10-CM | POA: Diagnosis not present

## 2015-03-04 DIAGNOSIS — L97212 Non-pressure chronic ulcer of right calf with fat layer exposed: Secondary | ICD-10-CM | POA: Insufficient documentation

## 2015-03-04 DIAGNOSIS — E11622 Type 2 diabetes mellitus with other skin ulcer: Secondary | ICD-10-CM | POA: Diagnosis present

## 2015-03-04 DIAGNOSIS — F17218 Nicotine dependence, cigarettes, with other nicotine-induced disorders: Secondary | ICD-10-CM | POA: Insufficient documentation

## 2015-03-05 NOTE — Progress Notes (Signed)
[name] ([id number]) Visit Report for 03/04/2015 Arrival Information Details Patient Name: [name], [name]. Date of Service: 03/04/2015 8:00 AM Medical Record Number: [id number] Patient Account Number: [id number] Date of Birth/Sex: 1965-05-24 (49 y.o. Male) Treating RN: Montey Hora Primary Care Physician: [name], [place] Other Clinician: Referring Physician: [name], [place] Treating Physician/Extender: Frann Rider in Treatment: 8 Visit Information History Since Last Visit Added or deleted any medications: No Patient Arrived: Ambulatory Any new allergies or adverse reactions: No Arrival Time: 08:04 Had a fall or experienced change in No Accompanied By: self activities of daily living that may affect Transfer Assistance: None risk of falls: Patient Identification Verified: Yes Signs or symptoms of abuse/neglect since last No Secondary Verification Process Yes visito Completed: Hospitalized since last visit: No Patient Requires Transmission-Based No Pain Present Now: No Precautions: Patient Has Alerts: Yes Patient Alerts: DM II Electronic Signature(s) Signed: 03/04/2015 5:01:23 PM By: Montey Hora Entered By: Montey Hora on 03/04/2015 96:29:52 [name], [name] ([id number]) -------------------------------------------------------------------------------- Clinic Level of Care Assessment Details Patient Name: [name] Date of Service: 03/04/2015 8:00 AM Medical Record Number: [id number] Patient Account Number: [id number] Date of Birth/Sex: 1965-05-07 (50 y.o. Male) Treating RN: Montey Hora Primary Care Physician: [name], [place] Other Clinician: Referring Physician: [name], MELINDA Treating Physician/Extender: Frann Rider in Treatment: 8 Clinic Level of Care Assessment Items TOOL 4 Quantity Score '[]'  - Use when only an EandM is performed on FOLLOW-UP visit 0 ASSESSMENTS - Nursing Assessment / Reassessment X - Reassessment of Co-morbidities  (includes updates in patient status) 1 10 X - Reassessment of Adherence to Treatment Plan 1 5 ASSESSMENTS - Wound and Skin Assessment / Reassessment '[]'  - Simple Wound Assessment / Reassessment - one wound 0 X - Complex Wound Assessment / Reassessment - multiple wounds 3 5 '[]'  - Dermatologic / Skin Assessment (not related to wound area) 0 ASSESSMENTS - Focused Assessment X - Circumferential Edema Measurements - multi extremities 1 5 '[]'  - Nutritional Assessment / Counseling / Intervention 0 X - Lower Extremity Assessment (monofilament, tuning fork, pulses) 1 5 '[]'  - Peripheral Arterial Disease Assessment (using hand held doppler) 0 ASSESSMENTS - Ostomy and/or Continence Assessment and Care '[]'  - Incontinence Assessment and Management 0 '[]'  - Ostomy Care Assessment and Management (repouching, etc.) 0 PROCESS - Coordination of Care X - Simple Patient / Family Education for ongoing care 1 15 '[]'  - Complex (extensive) Patient / Family Education for ongoing care 0 '[]'  - Staff obtains Programmer, systems, Records, Test Results / Process Orders 0 '[]'  - Staff telephones HHA, Nursing Homes / Clarify orders / etc 0 '[]'  - Routine Transfer to another Facility (non-emergent condition) 0 [name] ([id number]) '[]'  - Routine Hospital Admission (non-emergent condition) 0 '[]'  - New Admissions / Biomedical engineer / Ordering NPWT, Apligraf, etc. 0 '[]'  - Emergency Hospital Admission (emergent condition) 0 X - Simple Discharge Coordination 1 10 '[]'  - Complex (extensive) Discharge Coordination 0 PROCESS - Special Needs '[]'  - Pediatric / Minor Patient Management 0 '[]'  - Isolation Patient Management 0 '[]'  - Hearing / Language / Visual special needs 0 '[]'  - Assessment of Community assistance (transportation, D/C planning, etc.) 0 '[]'  - Additional assistance / Altered mentation 0 '[]'  - Support Surface(s) Assessment (bed, cushion, seat, etc.) 0 INTERVENTIONS - Wound Cleansing / Measurement '[]'  - Simple Wound Cleansing - one  wound 0 X - Complex Wound Cleansing - multiple wounds 3 5 X - Wound Imaging (photographs - any number of wounds) 1 5 '[]'  - Wound Tracing (  instead of photographs) 0 '[]'  - Simple Wound Measurement - one wound 0 X - Complex Wound Measurement - multiple wounds 3 5 INTERVENTIONS - Wound Dressings X - Small Wound Dressing one or multiple wounds 3 10 '[]'  - Medium Wound Dressing one or multiple wounds 0 '[]'  - Large Wound Dressing one or multiple wounds 0 '[]'  - Application of Medications - topical 0 '[]'  - Application of Medications - injection 0 INTERVENTIONS - Miscellaneous '[]'  - External ear exam 0 [name] ([id number]) '[]'  - Specimen Collection (cultures, biopsies, blood, body fluids, etc.) 0 '[]'  - Specimen(s) / Culture(s) sent or taken to Lab for analysis 0 '[]'  - Patient Transfer (multiple staff / Harrel Lemon Lift / Similar devices) 0 '[]'  - Simple Staple / Suture removal (25 or less) 0 '[]'  - Complex Staple / Suture removal (26 or more) 0 '[]'  - Hypo / Hyperglycemic Management (close monitor of Blood Glucose) 0 '[]'  - Ankle / Brachial Index (ABI) - do not check if billed separately 0 X - Vital Signs 1 5 Has the patient been seen at the hospital within the last three years: Yes Total Score: 135 Level Of Care: New/Established - Level 4 Electronic Signature(s) Signed: 03/04/2015 5:01:23 PM By: Montey Hora Entered By: Montey Hora on 03/04/2015 08:36:58 [name] ([id number]) -------------------------------------------------------------------------------- Encounter Discharge Information Details Patient Name: [name] Date of Service: 03/04/2015 8:00 AM Medical Record Number: [id number] Patient Account Number: [id number] Date of Birth/Sex: Jun 20, 1965 (50 y.o. Male) Treating RN: Montey Hora Primary Care Physician: [name], MELINDA Other Clinician: Referring Physician: [name], MELINDA Treating Physician/Extender: Frann Rider in Treatment: 8 Encounter Discharge Information  Items Discharge Pain Level: 0 Discharge Condition: Stable Ambulatory Status: Ambulatory Discharge Destination: Home Transportation: Private Auto Accompanied By: self Schedule Follow-up Appointment: Yes Medication Reconciliation completed and provided to Patient/Care No Amond Speranza: Provided on Clinical Summary of Care: 03/04/2015 Form Type Recipient Paper Patient RT Electronic Signature(s) Signed: 03/04/2015 8:39:13 AM By: Ruthine Dose Entered By: Ruthine Dose on 03/04/2015 08:39:13 [name] ([id number]) -------------------------------------------------------------------------------- Lower Extremity Assessment Details Patient Name: [name] Date of Service: 03/04/2015 8:00 AM Medical Record Number: [id number] Patient Account Number: [id number] Date of Birth/Sex: August 25, 1965 (49 y.o. Male) Treating RN: Montey Hora Primary Care Physician: [name], MELINDA Other Clinician: Referring Physician: [name], MELINDA Treating Physician/Extender: Frann Rider in Treatment: 8 Edema Assessment Assessed: [Left: No] [Right: No] Edema: [Left: Ye] [Right: s] Calf Left: Right: Point of Measurement: 34 cm From Medial Instep cm 49.3 cm Ankle Left: Right: Point of Measurement: 9 cm From Medial Instep cm 30.9 cm Vascular Assessment Pulses: Posterior Tibial Dorsalis Pedis Palpable: [Right:Yes] Extremity colors, hair growth, and conditions: Extremity Color: [Right:Hyperpigmented] Hair Growth on Extremity: [Right:No] Temperature of Extremity: [Right:Warm] Capillary Refill: [Right:< 3 seconds] Electronic Signature(s) Signed: 03/04/2015 5:01:23 PM By: Montey Hora Entered By: Montey Hora on 03/04/2015 08:13:21 [name] ([id number]) -------------------------------------------------------------------------------- Multi Wound Chart Details Patient Name: [name] Date of Service: 03/04/2015 8:00 AM Medical Record Number: [id number] Patient Account Number:  [id number] Date of Birth/Sex: 1965/03/24 (50 y.o. Male) Treating RN: Montey Hora Primary Care Physician: [name], MELINDA Other Clinician: Referring Physician: [name], MELINDA Treating Physician/Extender: Frann Rider in Treatment: 8 Vital Signs Height(in): 71 Pulse(bpm): 89 Weight(lbs): 360 Blood Pressure 139/83 (mmHg): Body Mass Index(BMI): 50 Temperature(F): 98.1 Respiratory Rate 18 (breaths/min): Photos: [2:No Photos] [3:No Photos] [4:No Photos] Wound Location: [2:Right Lower Leg - Lateral, Right Lower Leg - Medial Right Lower Leg - Distal Superior] Wounding Event: [2:Gradually Appeared] [3:Gradually  Appeared] [4:Gradually Appeared] Primary Etiology: [2:Venous Leg Ulcer] [3:Venous Leg Ulcer] [4:Venous Leg Ulcer] Comorbid History: [2:Lymphedema, Congestive Lymphedema, Congestive Lymphedema, Congestive Heart Failure, Hypertension, Type II Diabetes, Gout] [3:Heart Failure, Hypertension, Type II Diabetes, Gout] [4:Heart Failure, Hypertension, Type II Diabetes, Gout] Date Acquired: [2:12/13/2014] [3:12/13/2014] [4:12/13/2014] Weeks of Treatment: [2:8] [3:8] [4:8] Wound Status: [2:Open] [3:Open] [4:Open] Measurements L x W x D 0.4x0.3x1.7 [3:1x1x0.7] [4:0.9x0.8x0.9] (cm) Area (cm) : [2:0.094] [3:0.785] [4:0.565] Volume (cm) : [2:0.16] [3:0.55] [4:0.509] % Reduction in Area: [2:88.00%] [3:58.40%] [4:63.10%] % Reduction in Volume: -1.90% [3:-45.90%] [4:-66.30%] Classification: [2:Full Thickness Without Exposed Support Structures] [3:Full Thickness Without Exposed Support Structures] [4:Full Thickness Without Exposed Support Structures] HBO Classification: [2:Grade 1] [3:Grade 1] [4:Grade 1] Exudate Amount: [2:Large] [3:Large] [4:Large] Exudate Type: [2:Serous] [3:Serous] [4:Serous] Exudate Color: [2:amber] [3:amber] [4:amber] Wound Margin: [2:Flat and Intact] [3:Flat and Intact] [4:Flat and Intact] Granulation Amount: [2:Large (67-100%)] [3:Medium (34-66%)] [4:Medium  (34-66%)] Granulation Quality: [2:Pink] [3:Red, Pink] [4:Red, Pink] Necrotic Amount: [2:Small (1-33%)] [3:Medium (34-66%)] [4:Medium (34-66%)] Exposed Structures: JULIO, ZAPPIA ([id number]) Fascia: No Fascia: No Fascia: No Fat: No Fat: No Fat: No Tendon: No Tendon: No Tendon: No Muscle: No Muscle: No Muscle: No Joint: No Joint: No Joint: No Bone: No Bone: No Bone: No Limited to Skin Limited to Skin Limited to Skin Breakdown Breakdown Breakdown Epithelialization: None None None Periwound Skin Texture: Edema: Yes Edema: Yes Edema: Yes Excoriation: No Excoriation: No Induration: No Induration: No Callus: No Callus: No Crepitus: No Crepitus: No Fluctuance: No Fluctuance: No Friable: No Friable: No Rash: No Rash: No Scarring: No Scarring: No Periwound Skin Moist: Yes Moist: Yes Moist: Yes Moisture: Maceration: No Maceration: No Dry/Scaly: No Dry/Scaly: No Periwound Skin Color: No Abnormalities Noted Atrophie Blanche: No Atrophie Blanche: No Cyanosis: No Cyanosis: No Ecchymosis: No Ecchymosis: No Erythema: No Erythema: No Hemosiderin Staining: No Hemosiderin Staining: No Mottled: No Mottled: No Pallor: No Pallor: No Rubor: No Rubor: No Temperature: No Abnormality N/A No Abnormality Tenderness on Yes Yes Yes Palpation: Wound Preparation: Ulcer Cleansing: Ulcer Cleansing: Ulcer Cleansing: Rinsed/Irrigated with Rinsed/Irrigated with Rinsed/Irrigated with Saline Saline Saline Topical Anesthetic Topical Anesthetic Topical Anesthetic Applied: Other: lidocaine Applied: Other: lidocaine Applied: Other: lidocaine 4% 4% 4% Treatment Notes Electronic Signature(s) Signed: 03/04/2015 5:01:23 PM By: Montey Hora Entered By: Montey Hora on 03/04/2015 08:23:07 [name] ([id number]) -------------------------------------------------------------------------------- [place] Details Patient Name: [name] Date  of Service: 03/04/2015 8:00 AM Medical Record Number: [id number] Patient Account Number: [id number] Date of Birth/Sex: April 19, 1965 (49 y.o. Male) Treating RN: Montey Hora Primary Care Physician: [name], [place] Other Clinician: Referring Physician: [name], MELINDA Treating Physician/Extender: Frann Rider in Treatment: 8 Active Inactive Abuse / Safety / Falls / Self Care Management Nursing Diagnoses: Potential for falls Goals: Patient will remain injury free Date Initiated: 01/01/2015 Goal Status: Active Patient/caregiver will verbalize/demonstrate measures taken to prevent injury and/or falls Date Initiated: 01/01/2015 Goal Status: Active Interventions: Assess fall risk on admission and as needed Notes: Nutrition Nursing Diagnoses: Imbalanced nutrition Goals: Patient/caregiver agrees to and verbalizes understanding of need to use nutritional supplements and/or vitamins as prescribed Date Initiated: 01/01/2015 Goal Status: Active Patient/caregiver verbalizes understanding of need to maintain therapeutic glucose control per primary care physician Date Initiated: 01/01/2015 Goal Status: Active Patient/caregiver will maintain therapeutic glucose control Date Initiated: 01/01/2015 Goal Status: Active Interventions: HAILEY, MILES ([id number]) Provide education on elevated blood sugars and impact on wound healing Provide education on nutrition Treatment Activities: Education provided on Nutrition : 01/08/2015 Notes: Orientation to the  Wound Care Program Nursing Diagnoses: Knowledge deficit related to the wound healing center program Goals: Patient/caregiver will verbalize understanding of the [facility] Program Date Initiated: 01/01/2015 Goal Status: Active Interventions: Provide education on orientation to the wound center Notes: Pain, Acute or Chronic Nursing Diagnoses: Pain, acute or chronic: actual or potential Goals: Patient will verbalize adequate  pain control and receive pain control interventions during procedures as needed Date Initiated: 01/01/2015 Goal Status: Active Patient/caregiver will verbalize adequate pain control between visits Date Initiated: 01/01/2015 Goal Status: Active Patient/caregiver will verbalize comfort level met Date Initiated: 01/01/2015 Goal Status: Active Interventions: Assess comfort goal upon admission Complete pain assessment as per visit requirements Notes: Wound/Skin Impairment KRAYTON, WORTLEY ([id number]) Nursing Diagnoses: Impaired tissue integrity Knowledge deficit related to smoking impact on wound healing Knowledge deficit related to ulceration/compromised skin integrity Goals: Patient will demonstrate a reduced rate of smoking or cessation of smoking Date Initiated: 01/01/2015 Goal Status: Active Patient/caregiver will verbalize understanding of skin care regimen Date Initiated: 01/01/2015 Goal Status: Active Ulcer/skin breakdown will have a volume reduction of 30% by week 4 Date Initiated: 01/01/2015 Goal Status: Active Ulcer/skin breakdown will have a volume reduction of 50% by week 8 Date Initiated: 01/01/2015 Goal Status: Active Ulcer/skin breakdown will have a volume reduction of 80% by week 12 Date Initiated: 01/01/2015 Goal Status: Active Interventions: Assess patient/caregiver ability to obtain necessary supplies Assess patient/caregiver ability to perform ulcer/skin care regimen upon admission and as needed Notes: Electronic Signature(s) Signed: 03/04/2015 5:01:23 PM By: Montey Hora Entered By: Montey Hora on 03/04/2015 08:22:56 [name] ([id number]) -------------------------------------------------------------------------------- Patient/Caregiver Education Details Patient Name: [name] Date of Service: 03/04/2015 8:00 AM Medical Record Number: [id number] Patient Account Number: [id number] Date of Birth/Gender: January 12, 1966 (50 y.o. Male) Treating  RN: Montey Hora Primary Care Physician: [name], [place] Other Clinician: Referring Physician: [name], [place] Treating Physician/Extender: Frann Rider in Treatment: 8 Education Assessment Education Provided To: Patient Education Topics Provided Wound/Skin Impairment: Handouts: Other: wound care as ordered Methods: Demonstration, Explain/Verbal Responses: State content correctly Electronic Signature(s) Signed: 03/04/2015 5:01:23 PM By: Montey Hora Entered By: Montey Hora on 03/04/2015 08:38:18 [name] ([id number]) -------------------------------------------------------------------------------- Wound Assessment Details Patient Name: [name] Date of Service: 03/04/2015 8:00 AM Medical Record Number: [id number] Patient Account Number: [id number] Date of Birth/Sex: 03/01/1965 (50 y.o. Male) Treating RN: Montey Hora Primary Care Physician: [name], [place] Other Clinician: Referring Physician: [name], MELINDA Treating Physician/Extender: Frann Rider in Treatment: 8 Wound Status Wound Number: 2 Primary Venous Leg Ulcer Etiology: Wound Location: Right Lower Leg - Lateral, Superior Wound Open Status: Wounding Event: Gradually Appeared Comorbid Lymphedema, Congestive Heart Date Acquired: 12/13/2014 History: Failure, Hypertension, Type II Weeks Of Treatment: 8 Diabetes, Gout Clustered Wound: No Photos Photo Uploaded By: Montey Hora on 03/04/2015 10:39:34 Wound Measurements Length: (cm) 0.4 Width: (cm) 0.3 Depth: (cm) 1.7 Area: (cm) 0.094 Volume: (cm) 0.16 % Reduction in Area: 88% % Reduction in Volume: -1.9% Epithelialization: None Tunneling: No Undermining: No Wound Description Full Thickness Without Classification: Exposed Support Structures Diabetic Severity Grade 1 (Wagner): Wound Margin: Flat and Intact Exudate Amount: Large Exudate Type: Serous Exudate Color: amber Foul Odor After Cleansing: No Wound  Bed Granulation Amount: Large (67-100%) Exposed Structure LAEL, PILCH. ([id number]) Granulation Quality: Pink Fascia Exposed: No Necrotic Amount: Small (1-33%) Fat Layer Exposed: No Necrotic Quality: Adherent Slough Tendon Exposed: No Muscle Exposed: No Joint Exposed: No Bone Exposed: No Limited to Skin Breakdown Periwound Skin Texture Texture Color No Abnormalities Noted:  No No Abnormalities Noted: No Localized Edema: Yes Temperature / Pain Moisture Temperature: No Abnormality No Abnormalities Noted: No Tenderness on Palpation: Yes Moist: Yes Wound Preparation Ulcer Cleansing: Rinsed/Irrigated with Saline Topical Anesthetic Applied: Other: lidocaine 4%, Treatment Notes Wound #2 (Right, Lateral, Superior Lower Leg) 1. Cleansed with: Clean wound with Normal Saline 2. Anesthetic Topical Lidocaine 4% cream to wound bed prior to debridement 4. Dressing Applied: Aquacel Ag 5. Secondary [place] 7. Secured with Patient to wear own compression stockings Electronic Signature(s) Signed: 03/04/2015 5:01:23 PM By: Montey Hora Entered By: Montey Hora on 03/04/2015 08:20:34 [name] ([id number]) -------------------------------------------------------------------------------- Wound Assessment Details Patient Name: [name] Date of Service: 03/04/2015 8:00 AM Medical Record Number: [id number] Patient Account Number: [id number] Date of Birth/Sex: [birth year] (49 y.o. Male) Treating RN: Montey Hora Primary Care Physician: [name], [place] Other Clinician: Referring Physician: [name], [place] Treating Physician/Extender: Frann Rider in Treatment: 8 Wound Status Wound Number: 3 Primary Venous Leg Ulcer Etiology: Wound Location: Right Lower Leg - Medial Wound Open Wounding Event: Gradually Appeared Status: Date Acquired: 12/13/2014 Comorbid Lymphedema, Congestive Heart Weeks Of Treatment: 8 History: Failure, Hypertension,  Type II Clustered Wound: No Diabetes, Gout Photos Photo Uploaded By: Montey Hora on 03/04/2015 10:39:34 Wound Measurements Length: (cm) 1 Width: (cm) 1 Depth: (cm) 0.7 Area: (cm) 0.785 Volume: (cm) 0.55 % Reduction in Area: 58.4% % Reduction in Volume: -45.9% Epithelialization: None Tunneling: No Undermining: No Wound Description Full Thickness Without Classification: Exposed Support Structures Diabetic Severity Grade 1 (Wagner): Wound Margin: Flat and Intact Exudate Amount: Large Exudate Type: Serous Exudate Color: amber Foul Odor After Cleansing: No Wound Bed Granulation Amount: Medium (34-66%) Exposed Structure ITZAEL, LIPTAK. ([id number]) Granulation Quality: Red, Pink Fascia Exposed: No Necrotic Amount: Medium (34-66%) Fat Layer Exposed: No Necrotic Quality: Adherent Slough Tendon Exposed: No Muscle Exposed: No Joint Exposed: No Bone Exposed: No Limited to Skin Breakdown Periwound Skin Texture Texture Color No Abnormalities Noted: No No Abnormalities Noted: No Callus: No Atrophie Blanche: No Crepitus: No Cyanosis: No Excoriation: No Ecchymosis: No Fluctuance: No Erythema: No Friable: No Hemosiderin Staining: No Induration: No Mottled: No Localized Edema: Yes Pallor: No Rash: No Rubor: No Scarring: No Temperature / Pain Moisture Tenderness on Palpation: Yes No Abnormalities Noted: No Dry / Scaly: No Maceration: No Moist: Yes Wound Preparation Ulcer Cleansing: Rinsed/Irrigated with Saline Topical Anesthetic Applied: Other: lidocaine 4%, Treatment Notes Wound #3 (Right, Medial Lower Leg) 1. Cleansed with: Clean wound with Normal Saline 2. Anesthetic Topical Lidocaine 4% cream to wound bed prior to debridement 4. Dressing Applied: Aquacel Ag 5. Secondary [place] 7. Secured with Patient to wear own compression stockings Electronic Signature(s) Signed: 03/04/2015 5:01:23 PM By: Montey Hora Entered By:  Montey Hora on 03/04/2015 08:20:45 [name] ([name] ([id number]) -------------------------------------------------------------------------------- Wound Assessment Details Patient Name: [name] Date of Service: 03/04/2015 8:00 AM Medical Record Number: [id number] Patient Account Number: [id number] Date of Birth/Sex: 1965/01/27 (49 y.o. Male) Treating RN: Montey Hora Primary Care Physician: [name], [place] Other Clinician: Referring Physician: [name], MELINDA Treating Physician/Extender: Frann Rider in Treatment: 8 Wound Status Wound Number: 4 Primary Venous Leg Ulcer Etiology: Wound Location: Right Lower Leg - Distal Wound Open Wounding Event: Gradually Appeared Status: Date Acquired: 12/13/2014 Comorbid Lymphedema, Congestive Heart Weeks Of Treatment: 8 History: Failure, Hypertension, Type II Clustered Wound: No Diabetes, Gout Photos Photo Uploaded By: Montey Hora on 03/04/2015 10:39:56 Wound Measurements Length: (cm) 0.9 Width: (cm) 0.8 Depth: (  cm) 0.9 Area: (cm) 0.565 Volume: (cm) 0.509 % Reduction in Area: 63.1% % Reduction in Volume: -66.3% Epithelialization: None Tunneling: No Undermining: No Wound Description Full Thickness Without Classification: Exposed Support Structures Diabetic Severity Grade 1 (Wagner): Wound Margin: Flat and Intact Exudate Amount: Large Exudate Type: Serous Exudate Color: amber Foul Odor After Cleansing: No Wound Bed Granulation Amount: Medium (34-66%) Exposed Structure DAVONE, SHINAULT. ([id number]) Granulation Quality: Red, Pink Fascia Exposed: No Necrotic Amount: Medium (34-66%) Fat Layer Exposed: No Necrotic Quality: Adherent Slough Tendon Exposed: No Muscle Exposed: No Joint Exposed: No Bone Exposed: No Limited to Skin Breakdown Periwound Skin Texture Texture Color No Abnormalities Noted: No No Abnormalities Noted: No Callus: No Atrophie Blanche:  No Crepitus: No Cyanosis: No Excoriation: No Ecchymosis: No Fluctuance: No Erythema: No Friable: No Hemosiderin Staining: No Induration: No Mottled: No Localized Edema: Yes Pallor: No Rash: No Rubor: No Scarring: No Temperature / Pain Moisture Temperature: No Abnormality No Abnormalities Noted: No Tenderness on Palpation: Yes Dry / Scaly: No Maceration: No Moist: Yes Wound Preparation Ulcer Cleansing: Rinsed/Irrigated with Saline Topical Anesthetic Applied: Other: lidocaine 4%, Treatment Notes Wound #4 (Right, Distal Lower Leg) 1. Cleansed with: Clean wound with Normal Saline 2. Anesthetic Topical Lidocaine 4% cream to wound bed prior to debridement 4. Dressing Applied: Aquacel Ag 5. Secondary [place] 7. Secured with Patient to wear own compression stockings Electronic Signature(s) Signed: 03/04/2015 5:01:23 PM By: Montey Hora Entered By: Montey Hora on 03/04/2015 08:20:55 [name] ([name] ([id number]) -------------------------------------------------------------------------------- [place] Details Patient Name: [name] Date of Service: 03/04/2015 8:00 AM Medical Record Number: [id number] Patient Account Number: [id number] Date of Birth/Sex: May 31, 1965 (49 y.o. Male) Treating RN: Montey Hora Primary Care Physician: [name], [place] Other Clinician: Referring Physician: [name], [place] Treating Physician/Extender: Frann Rider in Treatment: 8 Vital Signs Time Taken: 08:06 Temperature (F): 98.1 Height (in): 71 Pulse (bpm): 89 Weight (lbs): 360 Respiratory Rate (breaths/min): 18 Body Mass Index (BMI): 50.2 Blood Pressure (mmHg): 139/83 Reference Range: 80 - 120 mg / dl Electronic Signature(s) Signed: 03/04/2015 5:01:23 PM By: Montey Hora Entered By: Montey Hora on 03/04/2015 08:07:49

## 2015-03-05 NOTE — Progress Notes (Signed)
Darren, Fitzgerald ([id number]) Visit Report for 03/04/2015 Chief Complaint Document Details Patient Name: [name], [name]. Date of Service: 03/04/2015 8:00 AM Medical Record Number: [id number] Patient Account Number: [id number] Date of Birth/Sex: 09/17/1965 (49 y.o. Male) Treating RN: Montey Hora Primary Care Physician: [name], [facility] Other Clinician: Referring Physician: [name], MELINDA Treating Physician/Extender: Frann Rider in Treatment: 8 Information Obtained from: Patient Chief Complaint Patients presents for treatment of an open diabetic ulcer over the right lower extremity with swelling of both legs for about 1 month. Electronic Signature(s) Signed: 03/04/2015 8:30:11 AM By: Christin Fudge MD, FACS Entered By: Christin Fudge on 03/04/2015 08:30:11 [name] ([id number]) -------------------------------------------------------------------------------- HPI Details Patient Name: [name] Date of Service: 03/04/2015 8:00 AM Medical Record Number: [id number] Patient Account Number: [id number] Date of Birth/Sex: 29-Oct-1965 (49 y.o. Male) Treating RN: Montey Hora Primary Care Physician: [name], [place] Other Clinician: Referring Physician: [name], MELINDA Treating Physician/Extender: Frann Rider in Treatment: 8 History of Present Illness Location: Started getting pustules and boils on his right lower extremity along with swelling and was treated for this Quality: Patient reports experiencing a dull pain to affected area(s). Severity: Patient states wound are getting worse. Duration: Patient has had the wound for < 4 weeks prior to presenting for treatment Timing: Pain in wound is Intermittent (comes and goes Context: The wound appeared gradually over time Modifying Factors: Other treatment(s) tried include: local care and antibiotics and some compression stockings. Associated Signs and Symptoms: Patient reports having difficulty standing for long  periods. HPI Description: 50 year old gentleman who has type 2 diabetes mellitus and morbid obesity has been having cellulitis and abscess of the right lower leg for the last 3 weeks. He has had multiple antibiotics and an IandD and over the last 10 days has been on Septra DS and doxycycline and prior to that Keflex. the patient is a poor historian and does not give a definite timeline but says he's been diagnosed with diabetes mellitus for about 8-10 months and has been on insulin. He does not measure his blood sugars regularly nor does he see his PCP regularly. Also has congestive heart failure for which he sees a cardiologist at [facility] but this is going to change as the cardiologist as retired. He is a smoker and smokes several cigars a day and drinks alcohol occasionally. He has been morbidly obese for several years. 01/08/2015 -- the patient complains about having to use his 3 layer compression wrap and has multiple issues with this none of them being medically oriented. He also continues to smoke, has not yet seen his cardiologist or his PCP and in general has continued to not look after himself. 01/15/2015 -- the patient did not allow [place] to put his 3 layer compression wrap last week and has been using his own compression stockings. Continues to smoke. He did see his cardiologist and they have increased his dose of Lasix, asked him to repeat an echo, lose weight and be compliant with his diabetic control. 01/29/2015 -- he has given up smoking for about the last week. He is unable to keep his appointment on 01/30/2015 with the vascular surgeons and has rescheduled it for next week. 02/12/2015 -- he continues to have a lot of edema and drains from the wounds. However he is not agreeable to wear 3 or 4 layer compression wraps. He wants to wear his 20-30 mm compression stockings only. 02/19/2015 -- he was seen by the vascular surgeon Dr. Delana Meyer on 02/18/2015 and a  duplex ultrasound  of the lower extremities shows normal deep venous system, superficial reflux is not present in the great saphenous veins bilaterally and the left small saphenous vein. There is reflux in the small saphenous vein on the right and there are several sporadic varicosities and the distal thigh on the right. He was not offered surgery but graduated compression stockings class I were recommended on a daily basis and was given a FATIMA, SHIMOMURA. ([id number]) prescription for this. He would follow-up with them in 3 months for possible using of lymph pumps at that time. Electronic Signature(s) Signed: 03/04/2015 8:30:25 AM By: Christin Fudge MD, FACS Entered By: Christin Fudge on 03/04/2015 08:30:25 [name] ([id number]) -------------------------------------------------------------------------------- Physical Exam Details Patient Name: [name] Date of Service: 03/04/2015 8:00 AM Medical Record Number: [id number] Patient Account Number: [id number] Date of Birth/Sex: Feb 28, 1965 (49 y.o. Male) Treating RN: Montey Hora Primary Care Physician: [name], [place] Other Clinician: Referring Physician: [name], MELINDA Treating Physician/Extender: Frann Rider in Treatment: 8 Constitutional . Pulse regular. Respirations normal and unlabored. Afebrile. . Eyes Nonicteric. Reactive to light. Ears, Nose, Mouth, and Throat Lips, teeth, and gums WNL.[name] Kitchen Moist mucosa without lesions. Neck supple and nontender. No palpable supraclavicular or cervical adenopathy. Normal sized without goiter. Respiratory WNL. No retractions.. Cardiovascular Pedal Pulses WNL. he continues to have significant lymphedema and this is stage II. Lymphatic No adneopathy. No adenopathy. No adenopathy. Musculoskeletal Adexa without tenderness or enlargement.. Digits and nails w/o clubbing, cyanosis, infection, petechiae, ischemia, or inflammatory conditions.. Integumentary (Hair, Skin) No suspicious lesions. No  crepitus or fluctuance. No peri-wound warmth or erythema. No masses.[name] Kitchen Psychiatric Judgement and insight Intact.. No evidence of depression, anxiety, or agitation.. Notes though the wounds look cleaner he has significant edema today and we will ask him to continue with his compression stockings of the 20-30 mm variety. Electronic Signature(s) Signed: 03/04/2015 8:31:10 AM By: Christin Fudge MD, FACS Entered By: Christin Fudge on 03/04/2015 08:31:10 [name] ([id number]) -------------------------------------------------------------------------------- Physician Orders Details Patient Name: [name] Date of Service: 03/04/2015 8:00 AM Medical Record Number: [id number] Patient Account Number: [id number] Date of Birth/Sex: Sep 05, 1965 (49 y.o. Male) Treating RN: Montey Hora Primary Care Physician: [name], [place] Other Clinician: Referring Physician: [name], MELINDA Treating Physician/Extender: Frann Rider in Treatment: 8 Verbal / Phone Orders: Yes Clinician: Montey Hora Read Back and Verified: Yes Diagnosis Coding Wound Cleansing Wound #2 Right,Lateral,Superior Lower Leg o Cleanse wound with mild soap and water o May Shower, gently pat wound dry prior to applying new dressing. Wound #3 Right,Medial Lower Leg o Cleanse wound with mild soap and water o May Shower, gently pat wound dry prior to applying new dressing. Wound #4 Right,Distal Lower Leg o Cleanse wound with mild soap and water o May Shower, gently pat wound dry prior to applying new dressing. Anesthetic Wound #2 Right,Lateral,Superior Lower Leg o Topical Lidocaine 4% cream applied to wound bed prior to debridement Wound #3 Right,Medial Lower Leg o Topical Lidocaine 4% cream applied to wound bed prior to debridement Wound #4 Right,Distal Lower Leg o Topical Lidocaine 4% cream applied to wound bed prior to debridement Primary Wound Dressing Wound #2 Right,Lateral,Superior Lower  Leg o Aquacel Ag Wound #3 Right,Medial Lower Leg o Aquacel Ag Wound #4 Right,Distal Lower Leg o Aquacel Ag Secondary Dressing Wound #2 Right,Lateral,Superior Lower Leg o Non-adherent pad - [place] ([id number]) Wound #3 Right,Medial Lower Leg o Non-adherent pad - telfa island Wound #4 Right,Distal Lower Leg o Non-adherent pad -  telfa island Dressing Change Frequency Wound #2 Right,Lateral,Superior Lower Leg o Change dressing every other day. Wound #3 Right,Medial Lower Leg o Change dressing every other day. Wound #4 Right,Distal Lower Leg o Change dressing every other day. Follow-up Appointments Wound #2 Right,Lateral,Superior Lower Leg o Return Appointment in 1 week. Wound #3 Right,Medial Lower Leg o Return Appointment in 1 week. Wound #4 Right,Distal Lower Leg o Return Appointment in 1 week. Edema Control Wound #2 Right,Lateral,Superior Lower Leg o Patient to wear own compression stockings o Elevate legs to the level of the heart and pump ankles as often as possible Wound #3 Right,Medial Lower Leg o Patient to wear own compression stockings o Elevate legs to the level of the heart and pump ankles as often as possible Wound #4 Right,Distal Lower Leg o Patient to wear own compression stockings o Elevate legs to the level of the heart and pump ankles as often as possible Electronic Signature(s) Signed: 03/04/2015 4:17:59 PM By: Christin Fudge MD, FACS Signed: 03/04/2015 5:01:23 PM By: Montey Hora Entered By: Montey Hora on 03/04/2015 08:25:32 [name] ([id number]) -------------------------------------------------------------------------------- Problem List Details Patient Name: [name] Date of Service: 03/04/2015 8:00 AM Medical Record Number: [id number] Patient Account Number: [id number] Date of Birth/Sex: 1966/01/19 (49 y.o. Male) Treating RN: Montey Hora Primary Care Physician: [name],  MELINDA Other Clinician: Referring Physician: [name], MELINDA Treating Physician/Extender: Frann Rider in Treatment: 8 Active Problems ICD-10 Encounter Code Description Active Date Diagnosis E11.622 Type 2 diabetes mellitus with other skin ulcer 01/01/2015 Yes E66.01 Morbid (severe) obesity due to excess calories 01/01/2015 Yes L97.212 Non-pressure chronic ulcer of right calf with fat layer 02/05/2015 Yes exposed I89.0 Lymphedema, not elsewhere classified 01/01/2015 Yes I50.40 Unspecified combined systolic (congestive) and diastolic 99991111 Yes (congestive) heart failure F17.218 Nicotine dependence, cigarettes, with other nicotine- 01/01/2015 Yes induced disorders 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 02/19/2015 Yes Inactive Problems Resolved Problems ICD-10 Code Description Active Date Resolved Date L02.415 Cutaneous abscess of right lower limb 01/01/2015 01/01/2015 [name] ([id number]) Electronic Signature(s) Signed: 03/04/2015 8:30:01 AM By: Christin Fudge MD, FACS Entered By: Christin Fudge on 03/04/2015 08:30:01 [name] ([id number]) -------------------------------------------------------------------------------- Progress Note Details Patient Name: [name] Date of Service: 03/04/2015 8:00 AM Medical Record Number: [id number] Patient Account Number: [id number] Date of Birth/Sex: 17-Oct-1965 (50 y.o. Male) Treating RN: Montey Hora Primary Care Physician: [name], MELINDA Other Clinician: Referring Physician: [name], MELINDA Treating Physician/Extender: Frann Rider in Treatment: 8 Subjective Chief Complaint Information obtained from Patient Patients presents for treatment of an open diabetic ulcer over the right lower extremity with swelling of both legs for about 1 month. History of Present Illness (HPI) The following HPI elements were documented for the patient's wound: Location: Started getting pustules and  boils on his right lower extremity along with swelling and was treated for this Quality: Patient reports experiencing a dull pain to affected area(s). Severity: Patient states wound are getting worse. Duration: Patient has had the wound for < 4 weeks prior to presenting for treatment Timing: Pain in wound is Intermittent (comes and goes Context: The wound appeared gradually over time Modifying Factors: Other treatment(s) tried include: local care and antibiotics and some compression stockings. Associated Signs and Symptoms: Patient reports having difficulty standing for long periods. 50 year old gentleman who has type 2 diabetes mellitus and morbid obesity has been having cellulitis and abscess of the right lower leg for the last 3 weeks. He has had multiple antibiotics and an  IandD and over the last 10 days has been on Septra DS and doxycycline and prior to that Keflex. the patient is a poor historian and does not give a definite timeline but says he's been diagnosed with diabetes mellitus for about 8-10 months and has been on insulin. He does not measure his blood sugars regularly nor does he see his PCP regularly. Also has congestive heart failure for which he sees a cardiologist at [facility] but this is going to change as the cardiologist as retired. He is a smoker and smokes several cigars a day and drinks alcohol occasionally. He has been morbidly obese for several years. 01/08/2015 -- the patient complains about having to use his 3 layer compression wrap and has multiple issues with this none of them being medically oriented. He also continues to smoke, has not yet seen his cardiologist or his PCP and in general has continued to not look after himself. 01/15/2015 -- the patient did not allow [place] to put his 3 layer compression wrap last week and has been using his own compression stockings. Continues to smoke. He did see his cardiologist and they have increased his dose of Lasix,  asked him to repeat an echo, lose weight and be compliant with his diabetic control. 01/29/2015 -- he has given up smoking for about the last week. He is unable to keep his appointment on 01/30/2015 with the vascular surgeons and has rescheduled it for next week. 02/12/2015 -- he continues to have a lot of edema and drains from the wounds. However he is not JERMELLE, MAYCOCK. ([id number]) agreeable to wear 3 or 4 layer compression wraps. He wants to wear his 20-30 mm compression stockings only. 02/19/2015 -- he was seen by the vascular surgeon Dr. Delana Meyer on 02/18/2015 and a duplex ultrasound of the lower extremities shows normal deep venous system, superficial reflux is not present in the great saphenous veins bilaterally and the left small saphenous vein. There is reflux in the small saphenous vein on the right and there are several sporadic varicosities and the distal thigh on the right. He was not offered surgery but graduated compression stockings class I were recommended on a daily basis and was given a prescription for this. He would follow-up with them in 3 months for possible using of lymph pumps at that time. Objective Constitutional Pulse regular. Respirations normal and unlabored. Afebrile. Vitals Time Taken: 8:06 AM, Height: 71 in, Weight: 360 lbs, BMI: 50.2, Temperature: 98.1 F, Pulse: 89 bpm, Respiratory Rate: 18 breaths/min, Blood Pressure: 139/83 mmHg. Eyes Nonicteric. Reactive to light. Ears, Nose, Mouth, and Throat Lips, teeth, and gums WNL.[name] Kitchen Moist mucosa without lesions. Neck supple and nontender. No palpable supraclavicular or cervical adenopathy. Normal sized without goiter. Respiratory WNL. No retractions.. Cardiovascular Pedal Pulses WNL. he continues to have significant lymphedema and this is stage II. Lymphatic No adneopathy. No adenopathy. No adenopathy. Musculoskeletal Adexa without tenderness or enlargement.. Digits and nails w/o clubbing, cyanosis,  infection, petechiae, ischemia, or inflammatory conditions.[name] Kitchen Psychiatric Judgement and insight Intact.. No evidence of depression, anxiety, or agitation.[name] Kitchen YOAN, MASSETT ([id number]) General Notes: though the wounds look cleaner he has significant edema today and we will ask him to continue with his compression stockings of the 20-30 mm variety. Integumentary (Hair, Skin) No suspicious lesions. No crepitus or fluctuance. No peri-wound warmth or erythema. No masses.. Wound #2 status is Open. Original cause of wound was Gradually Appeared. The wound is located on the Right,Lateral,Superior Lower Leg. The wound measures 0.4cm  length x 0.3cm width x 1.7cm depth; 0.094cm^2 area and 0.16cm^3 volume. The wound is limited to skin breakdown. There is no tunneling or undermining noted. There is a large amount of serous drainage noted. The wound margin is flat and intact. There is large (67-100%) pink granulation within the wound bed. There is a small (1-33%) amount of necrotic tissue within the wound bed including Adherent Slough. The periwound skin appearance exhibited: Localized Edema, Moist. Periwound temperature was noted as No Abnormality. The periwound has tenderness on palpation. Wound #3 status is Open. Original cause of wound was Gradually Appeared. The wound is located on the Right,Medial Lower Leg. The wound measures 1cm length x 1cm width x 0.7cm depth; 0.785cm^2 area and 0.55cm^3 volume. The wound is limited to skin breakdown. There is no tunneling or undermining noted. There is a large amount of serous drainage noted. The wound margin is flat and intact. There is medium (34-66%) red, pink granulation within the wound bed. There is a medium (34-66%) amount of necrotic tissue within the wound bed including Adherent Slough. The periwound skin appearance exhibited: Localized Edema, Moist. The periwound skin appearance did not exhibit: Callus, Crepitus, Excoriation, Fluctuance, Friable,  Induration, Rash, Scarring, Dry/Scaly, Maceration, Atrophie Blanche, Cyanosis, Ecchymosis, Hemosiderin Staining, Mottled, Pallor, Rubor, Erythema. The periwound has tenderness on palpation. Wound #4 status is Open. Original cause of wound was Gradually Appeared. The wound is located on the Right,Distal Lower Leg. The wound measures 0.9cm length x 0.8cm width x 0.9cm depth; 0.565cm^2 area and 0.509cm^3 volume. The wound is limited to skin breakdown. There is no tunneling or undermining noted. There is a large amount of serous drainage noted. The wound margin is flat and intact. There is medium (34-66%) red, pink granulation within the wound bed. There is a medium (34-66%) amount of necrotic tissue within the wound bed including Adherent Slough. The periwound skin appearance exhibited: Localized Edema, Moist. The periwound skin appearance did not exhibit: Callus, Crepitus, Excoriation, Fluctuance, Friable, Induration, Rash, Scarring, Dry/Scaly, Maceration, Atrophie Blanche, Cyanosis, Ecchymosis, Hemosiderin Staining, Mottled, Pallor, Rubor, Erythema. Periwound temperature was noted as No Abnormality. The periwound has tenderness on palpation. Assessment Active Problems ICD-10 E11.622 - Type 2 diabetes mellitus with other skin ulcer E66.01 - Morbid (severe) obesity due to excess calories L97.212 - Non-pressure chronic ulcer of right calf with fat layer exposed I89.0 - Lymphedema, not elsewhere classified MAYAR, PIROLLI ([id number]) I50.40 - Unspecified combined systolic (congestive) and diastolic (congestive) heart failure F17.218 - Nicotine dependence, cigarettes, with other nicotine-induced disorders I83.012 - 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 Plan Wound Cleansing: Wound #2 Right,Lateral,Superior Lower Leg: Cleanse wound with mild soap and water May Shower, gently pat wound dry prior to applying new dressing. Wound #3 Right,Medial Lower Leg: Cleanse wound with  mild soap and water May Shower, gently pat wound dry prior to applying new dressing. Wound #4 Right,Distal Lower Leg: Cleanse wound with mild soap and water May Shower, gently pat wound dry prior to applying new dressing. Anesthetic: Wound #2 Right,Lateral,Superior Lower Leg: Topical Lidocaine 4% cream applied to wound bed prior to debridement Wound #3 Right,Medial Lower Leg: Topical Lidocaine 4% cream applied to wound bed prior to debridement Wound #4 Right,Distal Lower Leg: Topical Lidocaine 4% cream applied to wound bed prior to debridement Primary Wound Dressing: Wound #2 Right,Lateral,Superior Lower Leg: Aquacel Ag Wound #3 Right,Medial Lower Leg: Aquacel Ag Wound #4 Right,Distal Lower Leg: Aquacel Ag Secondary Dressing: Wound #2 Right,Lateral,Superior Lower Leg: Non-adherent pad -  telfa island Wound #3 Right,Medial Lower Leg: Non-adherent pad - telfa island Wound #4 Right,Distal Lower Leg: Non-adherent pad - telfa island Dressing Change Frequency: Wound #2 Right,Lateral,Superior Lower Leg: Change dressing every other day. Wound #3 Right,Medial Lower Leg: Change dressing every other day. Wound #4 Right,Distal Lower Leg: Change dressing every other day. Follow-up Appointments: YAKOV, LAUGHTON ([id number]) Wound #2 Right,Lateral,Superior Lower Leg: Return Appointment in 1 week. Wound #3 Right,Medial Lower Leg: Return Appointment in 1 week. Wound #4 Right,Distal Lower Leg: Return Appointment in 1 week. Edema Control: Wound #2 Right,Lateral,Superior Lower Leg: Patient to wear own compression stockings Elevate legs to the level of the heart and pump ankles as often as possible Wound #3 Right,Medial Lower Leg: Patient to wear own compression stockings Elevate legs to the level of the heart and pump ankles as often as possible Wound #4 Right,Distal Lower Leg: Patient to wear own compression stockings Elevate legs to the level of the heart and pump ankles as often as  possible I have discussed with him the need to wear compression stockings at all times from morning to night and he is agreeable about this. He will not agree to compression wraps to be applied to his legs. I have asked him to buy a spare pair of compression stockings of the 30-40 mm variety. He may also need to see his PCP regarding his dosage of his diuretic. He will see [place] back next week. Electronic Signature(s) Signed: 03/04/2015 8:32:35 AM By: Christin Fudge MD, FACS Entered By: Christin Fudge on 03/04/2015 08:32:35 [name] ([id number]) -------------------------------------------------------------------------------- SuperBill Details Patient Name: [name] Date of Service: 03/04/2015 Medical Record Number: [id number] Patient Account Number: [id number] Date of Birth/Sex: February 25, 1965 (49 y.o. Male) Treating RN: Montey Hora Primary Care Physician: [name], [place] Other Clinician: Referring Physician: [name], MELINDA Treating Physician/Extender: Frann Rider in Treatment: 8 Diagnosis Coding ICD-10 Codes Code Description E11.622 Type 2 diabetes mellitus with other skin ulcer E66.01 Morbid (severe) obesity due to excess calories L97.212 Non-pressure chronic ulcer of right calf with fat layer exposed I89.0 Lymphedema, not elsewhere classified I50.40 Unspecified combined systolic (congestive) and diastolic (congestive) heart failure F17.218 Nicotine dependence, cigarettes, with other nicotine-induced disorders 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

## 2015-03-08 ENCOUNTER — Ambulatory Visit: Payer: Managed Care, Other (non HMO) | Admitting: Family Medicine

## 2015-03-11 ENCOUNTER — Encounter: Payer: Managed Care, Other (non HMO) | Admitting: Surgery

## 2015-03-11 DIAGNOSIS — E11622 Type 2 diabetes mellitus with other skin ulcer: Secondary | ICD-10-CM | POA: Diagnosis not present

## 2015-03-12 NOTE — Progress Notes (Signed)
[name], [name] ([id number]) Visit Report for 03/11/2015 Chief Complaint Document Details Patient Name: [name], [name] Date of Service: 03/11/2015 8:45 AM Medical Record Number: [id number] Patient Account Number: [id number] Date of Birth/Sex: Jun 05, 1965 (50 y.o. Male) Treating RN: [name] Primary Care Physician: [name], [name] Other Clinician: Referring Physician: [name], [name] Treating Physician/Extender: [name] in Treatment: 9 Information Obtained from: Patient Chief Complaint Patients presents for treatment of an open diabetic ulcer over the right lower extremity with swelling of both legs for about 1 month. Electronic Signature(s) Signed: 03/11/2015 9:35:52 AM By: Darren Fudge MD, FACS Entered By: [name] on 03/11/2015 09:35:52 [name] ([id number]) -------------------------------------------------------------------------------- HPI Details Patient Name: [name] Date of Service: 03/11/2015 8:45 AM Medical Record Number: [id number] Patient Account Number: [id number] Date of Birth/Sex: Jun 26, 1965 (50 y.o. Male) Treating RN: [name] Primary Care Physician: [name], [place] Other Clinician: Referring Physician: [name], [name] Treating Physician/Extender: [name] in Treatment: 9 History of Present Illness Location: Started getting pustules and boils on his right lower extremity along with swelling and was treated for this Quality: Patient reports experiencing a dull pain to affected area(s). Severity: Patient states wound are getting worse. Duration: Patient has had the wound for < 4 weeks prior to presenting for treatment Timing: Pain in wound is Intermittent (comes and goes Context: The wound appeared gradually over time Modifying Factors: Other treatment(s) tried include: local care and antibiotics and some compression stockings. Associated Signs and Symptoms: Patient reports having difficulty standing for long  periods. HPI Description: 50 year old gentleman who has type 2 diabetes mellitus and morbid obesity has been having cellulitis and abscess of the right lower leg for the last 3 weeks. He has had multiple antibiotics and an IandD and over the last 10 days has been on Septra DS and doxycycline and prior to that Keflex. the patient is a poor historian and does not give a definite timeline but says he's been diagnosed with diabetes mellitus for about 8-10 months and has been on insulin. He does not measure his blood sugars regularly nor does he see his PCP regularly. Also has congestive heart failure for which he sees a [facility]ologist at [name] but this is going to change as the cardiologist as retired. He is a smoker and smokes several cigars a day and drinks alcohol occasionally. He has been morbidly obese for several years. 01/08/2015 -- the patient complains about having to use his 3 layer compression wrap and has multiple issues with this none of them being medically oriented. He also continues to smoke, has not yet seen his cardiologist or his PCP and in general has continued to not look after himself. 01/15/2015 -- the patient did not allow [place] to put his 3 layer compression wrap last week and has been using his own compression stockings. Continues to smoke. He did see his cardiologist and they have increased his dose of Lasix, asked him to repeat an echo, lose weight and be compliant with his diabetic control. 01/29/2015 -- he has given up smoking for about the last week. He is unable to keep his appointment on 01/30/2015 with the vascular surgeons and has rescheduled it for next week. 02/12/2015 -- he continues to have a lot of edema and drains from the wounds. However he is not agreeable to wear 3 or 4 layer compression wraps. He wants to wear his 20-30 mm compression stockings only. 02/19/2015 -- he was seen by the vascular surgeon [name] on 02/18/2015 and a  duplex ultrasound  of the lower extremities shows normal deep venous system, superficial reflux is not present in the great saphenous veins bilaterally and the left small saphenous vein. There is reflux in the small saphenous vein on the right and there are several sporadic varicosities and the distal thigh on the right. He was not offered surgery but graduated compression stockings class I were recommended on a daily basis and was given a [name]. ([id number]) prescription for this. He would follow-up with them in 3 months for possible using of lymph pumps at that time. Electronic Signature(s) Signed: 03/11/2015 9:35:56 AM By: Darren Fudge MD, FACS Entered By: [name] on 03/11/2015 09:35:56 [name] ([id number]) -------------------------------------------------------------------------------- Physical Exam Details Patient Name: [name] Date of Service: 03/11/2015 8:45 AM Medical Record Number: [id number] Patient Account Number: [id number] Date of Birth/Sex: [birth year] (50 y.o. Male) Treating RN: [name] Primary Care Physician: [name], [place] Other Clinician: Referring Physician: [name], [name] Treating Physician/Extender: [name] in Treatment: 9 Constitutional . Pulse regular. Respirations normal and unlabored. Afebrile. . Eyes Nonicteric. Reactive to light. Ears, Nose, Mouth, and Throat Lips, teeth, and gums WNL.[name] Kitchen Moist mucosa without lesions. Neck supple and nontender. No palpable supraclavicular or cervical adenopathy. Normal sized without goiter. Respiratory WNL. No retractions.. Cardiovascular Pedal Pulses WNL. No clubbing, cyanosis or edema. Lymphatic No adneopathy. No adenopathy. No adenopathy. Musculoskeletal Adexa without tenderness or enlargement.. Digits and nails w/o clubbing, cyanosis, infection, petechiae, ischemia, or inflammatory conditions.. Integumentary (Hair, Skin) No suspicious lesions. No crepitus or fluctuance. No  peri-wound warmth or erythema. No masses.[name] Kitchen Psychiatric Judgement and insight Intact.. No evidence of depression, anxiety, or agitation.. Notes the low 2 wounds look clean and shallow. The most superior wound has significant depth to it and has undermining and will need to be packed with silver alginate and continue to use his compression. Electronic Signature(s) Signed: 03/11/2015 9:38:15 AM By: Darren Fudge MD, FACS Entered By: [name] on 03/11/2015 09:38:15 [name] ([id number]) -------------------------------------------------------------------------------- Physician Orders Details Patient Name: [name] Date of Service: 03/11/2015 8:45 AM Medical Record Number: [id number] Patient Account Number: [id number] Date of Birth/Sex: [birth year] (49 y.o. Male) Treating RN: [name] Primary Care Physician: [name], [place] Other Clinician: Referring Physician: [name], [name] Treating Physician/Extender: [name] in Treatment: 9 Verbal / Phone Orders: Yes Clinician: Montey Fitzgerald Read Back and Verified: Yes Diagnosis Coding Wound Cleansing Wound #2 Right,Lateral,Superior Lower Leg o Cleanse wound with mild soap and water o May Shower, gently pat wound dry prior to applying new dressing. Wound #3 Right,Medial Lower Leg o Cleanse wound with mild soap and water o May Shower, gently pat wound dry prior to applying new dressing. Wound #4 Right,Distal Lower Leg o Cleanse wound with mild soap and water o May Shower, gently pat wound dry prior to applying new dressing. Anesthetic Wound #2 Right,Lateral,Superior Lower Leg o Topical Lidocaine 4% cream applied to wound bed prior to debridement Wound #3 Right,Medial Lower Leg o Topical Lidocaine 4% cream applied to wound bed prior to debridement Wound #4 Right,Distal Lower Leg o Topical Lidocaine 4% cream applied to wound bed prior to debridement Primary Wound Dressing Wound #2  Right,Lateral,Superior Lower Leg o Aquacel Ag Wound #3 Right,Medial Lower Leg o Aquacel Ag Wound #4 Right,Distal Lower Leg o Aquacel Ag Secondary Dressing Wound #2 Right,Lateral,Superior Lower Leg o Non-adherent pad - [place] ([id number]) Wound #3 Right,Medial Lower Leg o Non-adherent pad - telfa island Wound #4 Right,Distal Lower Leg   o Non-adherent pad - telfa island Dressing Change Frequency Wound #2 Right,Lateral,Superior Lower Leg o Change dressing every other day. Wound #3 Right,Medial Lower Leg o Change dressing every other day. Wound #4 Right,Distal Lower Leg o Change dressing every other day. Follow-up Appointments Wound #2 Right,Lateral,Superior Lower Leg o Return Appointment in 1 week. Wound #3 Right,Medial Lower Leg o Return Appointment in 1 week. Wound #4 Right,Distal Lower Leg o Return Appointment in 1 week. Edema Control Wound #2 Right,Lateral,Superior Lower Leg o Patient to wear own compression stockings o Elevate legs to the level of the heart and pump ankles as often as possible Wound #3 Right,Medial Lower Leg o Patient to wear own compression stockings o Elevate legs to the level of the heart and pump ankles as often as possible Wound #4 Right,Distal Lower Leg o Patient to wear own compression stockings o Elevate legs to the level of the heart and pump ankles as often as possible Electronic Signature(s) Signed: 03/11/2015 4:26:55 PM By: Darren Fudge MD, FACS Signed: 03/11/2015 5:57:29 PM By: [name] Entered By: [name] on 03/11/2015 [id number] [name] ([id number]) -------------------------------------------------------------------------------- Problem List Details Patient Name: [name] Date of Service: 03/11/2015 8:45 AM Medical Record Number: [id number] Patient Account Number: [id number] Date of Birth/Sex: [birth year] (50 y.o. Male) Treating RN: [name] Primary Care Physician: [name], [name] Other Clinician: Referring Physician: [name], [name] Treating Physician/Extender: [name] in Treatment: 9 Active Problems ICD-10 Encounter Code Description Active Date Diagnosis E11.622 Type 2 diabetes mellitus with other skin ulcer 01/01/2015 Yes E66.01 Morbid (severe) obesity due to excess calories 01/01/2015 Yes L97.212 Non-pressure chronic ulcer of right calf with fat layer 02/05/2015 Yes exposed I89.0 Lymphedema, not elsewhere classified 01/01/2015 Yes I50.40 Unspecified combined systolic (congestive) and diastolic [id number] Yes (congestive) heart failure F17.218 Nicotine dependence, cigarettes, with other nicotine- 01/01/2015 Yes induced disorders 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 02/19/2015 Yes Inactive Problems Resolved Problems ICD-10 Code Description Active Date Resolved Date L02.415 Cutaneous abscess of right lower limb 01/01/2015 01/01/2015 [name] ([id number]) Electronic Signature(s) Signed: 03/11/2015 9:35:45 AM By: Darren Fudge MD, FACS Entered By: [name] on 03/11/2015 09:35:45 [name] ([id number]) -------------------------------------------------------------------------------- Progress Note Details Patient Name: [name] Date of Service: 03/11/2015 8:45 AM Medical Record Number: [id number] Patient Account Number: [id number] Date of Birth/Sex: [birth year] (50 y.o. Male) Treating RN: [name] Primary Care Physician: [name], [name] Other Clinician: Referring Physician: [name], [name] Treating Physician/Extender: [name] in Treatment: 9 Subjective Chief Complaint Information obtained from Patient Patients presents for treatment of an open diabetic ulcer over the right lower extremity with swelling of both legs for about 1 month. History of Present Illness (HPI) The following HPI elements were documented for the patient's  wound: Location: Started getting pustules and boils on his right lower extremity along with swelling and was treated for this Quality: Patient reports experiencing a dull pain to affected area(s). Severity: Patient states wound are getting worse. Duration: Patient has had the wound for < 4 weeks prior to presenting for treatment Timing: Pain in wound is Intermittent (comes and goes Context: The wound appeared gradually over time Modifying Factors: Other treatment(s) tried include: local care and antibiotics and some compression stockings. Associated Signs and Symptoms: Patient reports having difficulty standing for long periods. 50 year old gentleman who has type 2 diabetes mellitus and morbid obesity has been having cellulitis and abscess of the right lower leg for the last 3 weeks. He has had  multiple antibiotics and an IandD and over the last 10 days has been on Septra DS and doxycycline and prior to that Keflex. the patient is a poor historian and does not give a definite timeline but says he's been diagnosed with diabetes mellitus for about 8-10 months and has been on insulin. He does not measure his blood sugars regularly nor does he see his PCP regularly. Also has congestive heart failure for which he sees a cardiologist at [facility] but this is going to change as the cardiologist as retired. He is a smoker and smokes several cigars a day and drinks alcohol occasionally. He has been morbidly obese for several years. 01/08/2015 -- the patient complains about having to use his 3 layer compression wrap and has multiple issues with this none of them being medically oriented. He also continues to smoke, has not yet seen his cardiologist or his PCP and in general has continued to not look after himself. 01/15/2015 -- the patient did not allow [place] to put his 3 layer compression wrap last week and has been using his own compression stockings. Continues to smoke. He did see his cardiologist  and they have increased his dose of Lasix, asked him to repeat an echo, lose weight and be compliant with his diabetic control. 01/29/2015 -- he has given up smoking for about the last week. He is unable to keep his appointment on 01/30/2015 with the vascular surgeons and has rescheduled it for next week. 02/12/2015 -- he continues to have a lot of edema and drains from the wounds. However he is not [name], [name]. ([id number]) agreeable to wear 3 or 4 layer compression wraps. He wants to wear his 20-30 mm compression stockings only. 02/19/2015 -- he was seen by the vascular surgeon [name] on 02/18/2015 and a duplex ultrasound of the lower extremities shows normal deep venous system, superficial reflux is not present in the great saphenous veins bilaterally and the left small saphenous vein. There is reflux in the small saphenous vein on the right and there are several sporadic varicosities and the distal thigh on the right. He was not offered surgery but graduated compression stockings class I were recommended on a daily basis and was given a prescription for this. He would follow-up with them in 3 months for possible using of lymph pumps at that time. Objective Constitutional Pulse regular. Respirations normal and unlabored. Afebrile. Vitals Time Taken: 8:52 AM, Height: 71 in, Weight: 360 lbs, BMI: 50.2, Temperature: 98.3 F, Pulse: 86 bpm, Respiratory Rate: 20 breaths/min, Blood Pressure: 138/90 mmHg. Eyes Nonicteric. Reactive to light. Ears, Nose, Mouth, and Throat Lips, teeth, and gums WNL.[name] Kitchen Moist mucosa without lesions. Neck supple and nontender. No palpable supraclavicular or cervical adenopathy. Normal sized without goiter. Respiratory WNL. No retractions.. Cardiovascular Pedal Pulses WNL. No clubbing, cyanosis or edema. Lymphatic No adneopathy. No adenopathy. No adenopathy. Musculoskeletal Adexa without tenderness or enlargement.. Digits and nails w/o clubbing,  cyanosis, infection, petechiae, ischemia, or inflammatory conditions.[name] Kitchen Psychiatric Judgement and insight Intact.. No evidence of depression, anxiety, or agitation.[name] Kitchen [name], [name] ([id number]) General Notes: the low 2 wounds look clean and shallow. The most superior wound has significant depth to it and has undermining and will need to be packed with silver alginate and continue to use his compression. Integumentary (Hair, Skin) No suspicious lesions. No crepitus or fluctuance. No peri-wound warmth or erythema. No masses.. Wound #2 status is Open. Original cause of wound was Gradually Appeared. The wound is located on the  Right,Lateral,Superior Lower Leg. The wound measures 0.4cm length x 0.4cm width x 1.7cm depth; 0.126cm^2 area and 0.214cm^3 volume. The wound is limited to skin breakdown. There is no tunneling or undermining noted. There is a large amount of purulent drainage noted. The wound margin is flat and intact. There is large (67-100%) pink granulation within the wound bed. There is a small (1-33%) amount of necrotic tissue within the wound bed including Adherent Slough. The periwound skin appearance exhibited: Localized Edema, Moist. Periwound temperature was noted as No Abnormality. The periwound has tenderness on palpation. Wound #3 status is Open. Original cause of wound was Gradually Appeared. The wound is located on the Right,Medial Lower Leg. The wound measures 0.8cm length x 0.6cm width x 0.6cm depth; 0.377cm^2 area and 0.226cm^3 volume. The wound is limited to skin breakdown. There is no tunneling or undermining noted. There is a large amount of serous drainage noted. The wound margin is flat and intact. There is medium (34-66%) red, pink granulation within the wound bed. There is a medium (34-66%) amount of necrotic tissue within the wound bed including Adherent Slough. The periwound skin appearance exhibited: Localized Edema, Moist. The periwound skin appearance did not  exhibit: Callus, Crepitus, Excoriation, Fluctuance, Friable, Induration, Rash, Scarring, Dry/Scaly, Maceration, Atrophie Blanche, Cyanosis, Ecchymosis, Hemosiderin Staining, Mottled, Pallor, Rubor, Erythema. The periwound has tenderness on palpation. Wound #4 status is Open. Original cause of wound was Gradually Appeared. The wound is located on the Right,Distal Lower Leg. The wound measures 0.9cm length x 0.7cm width x 0.6cm depth; 0.495cm^2 area and 0.297cm^3 volume. The wound is limited to skin breakdown. There is no tunneling or undermining noted. There is a large amount of serous drainage noted. The wound margin is flat and intact. There is medium (34-66%) red, pink granulation within the wound bed. There is a medium (34-66%) amount of necrotic tissue within the wound bed including Adherent Slough. The periwound skin appearance exhibited: Localized Edema, Moist. The periwound skin appearance did not exhibit: Callus, Crepitus, Excoriation, Fluctuance, Friable, Induration, Rash, Scarring, Dry/Scaly, Maceration, Atrophie Blanche, Cyanosis, Ecchymosis, Hemosiderin Staining, Mottled, Pallor, Rubor, Erythema. Periwound temperature was noted as No Abnormality. The periwound has tenderness on palpation. Assessment Active Problems ICD-10 E11.622 - Type 2 diabetes mellitus with other skin ulcer E66.01 - Morbid (severe) obesity due to excess calories L97.212 - Non-pressure chronic ulcer of right calf with fat layer exposed I89.0 - Lymphedema, not elsewhere classified [name], [name]. ([id number]) I50.40 - Unspecified combined systolic (congestive) and diastolic (congestive) heart failure F17.218 - Nicotine dependence, cigarettes, with other nicotine-induced disorders I83.012 - 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 He continues to have issues with wearing a compression wrap but has been using his stockings of the 20- 30 mm variety. I have tried to motivate him to wear 30-40  and also possibly use a compression wrap like a Profore 4-layer. He will think about it and let us know next week. Plan Wound Cleansing: Wound #2 Right,Lateral,Superior Lower Leg: Cleanse wound with mild soap and water May Shower, gently pat wound dry prior to applying new dressing. Wound #3 Right,Medial Lower Leg: Cleanse wound with mild soap and water May Shower, gently pat wound dry prior to applying new dressing. Wound #4 Right,Distal Lower Leg: Cleanse wound with mild soap and water May Shower, gently pat wound dry prior to applying new dressing. Anesthetic: Wound #2 Right,Lateral,Superior Lower Leg: Topical Lidocaine 4% cream applied to wound bed prior to debridement Wound #3 Right,Medial Lower Leg: Topical  Lidocaine 4% cream applied to wound bed prior to debridement Wound #4 Right,Distal Lower Leg: Topical Lidocaine 4% cream applied to wound bed prior to debridement Primary Wound Dressing: Wound #2 Right,Lateral,Superior Lower Leg: Aquacel Ag Wound #3 Right,Medial Lower Leg: Aquacel Ag Wound #4 Right,Distal Lower Leg: Aquacel Ag Secondary Dressing: Wound #2 Right,Lateral,Superior Lower Leg: Non-adherent pad - telfa island Wound #3 Right,Medial Lower Leg: Non-adherent pad - telfa island Wound #4 Right,Distal Lower Leg: Non-adherent pad - telfa island Dressing Change Frequency: Wound #2 Right,Lateral,Superior Lower Leg: Change dressing every other day. [name], [name] ([id number]) Wound #3 Right,Medial Lower Leg: Change dressing every other day. Wound #4 Right,Distal Lower Leg: Change dressing every other day. Follow-up Appointments: Wound #2 Right,Lateral,Superior Lower Leg: Return Appointment in 1 week. Wound #3 Right,Medial Lower Leg: Return Appointment in 1 week. Wound #4 Right,Distal Lower Leg: Return Appointment in 1 week. Edema Control: Wound #2 Right,Lateral,Superior Lower Leg: Patient to wear own compression stockings Elevate legs to the level of  the heart and pump ankles as often as possible Wound #3 Right,Medial Lower Leg: Patient to wear own compression stockings Elevate legs to the level of the heart and pump ankles as often as possible Wound #4 Right,Distal Lower Leg: Patient to wear own compression stockings Elevate legs to the level of the heart and pump ankles as often as possible He continues to have issues with wearing a compression wrap but has been using his stockings of the 20- 30 mm variety. I have tried to motivate him to wear 30-40 and also possibly use a compression wrap like a Profore 4-layer. He will think about it and let us know next week. Electronic Signature(s) Signed: 03/11/2015 9:39:30 AM By: Darren Fudge MD, FACS Entered By: [name] on 03/11/2015 09:39:29 [name] ([id number]) -------------------------------------------------------------------------------- SuperBill Details Patient Name: [name] Date of Service: 03/11/2015 Medical Record Number: [id number] Patient Account Number: [id number] Date of Birth/Sex: [birth year] (49 y.o. Male) Treating RN: [name] Primary Care Physician: [name], [place] Other Clinician: Referring Physician: [name], [name] Treating Physician/Extender: [name] in Treatment: 9 Diagnosis Coding ICD-10 Codes Code Description E11.622 Type 2 diabetes mellitus with other skin ulcer E66.01 Morbid (severe) obesity due to excess calories L97.212 Non-pressure chronic ulcer of right calf with fat layer exposed I89.0 Lymphedema, not elsewhere classified I50.40 Unspecified combined systolic (congestive) and diastolic (congestive) heart failure F17.218 Nicotine dependence, cigarettes, with other nicotine-induced disorders 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 Facility Procedures CPT4 Code: [id number] Description: 99214 - WOUND CARE VISIT-LEV 4 EST PT Modifier: Quantity: 1 Physician Procedures CPT4 Code:  [id number] Description: 99213 - WC PHYS LEVEL 3 - EST PT ICD-10 Description Diagnosis E11.622 Type 2 diabetes mellitus with other skin ulcer L97.212 Non-pressure chronic ulcer of right calf with fat I89.0 Lymphedema, not elsewhere classified I83.012 Varicose veins  of right lower extremity with ulce Modifier: layer exposed r of calf Quantity: 1 Electronic Signature(s) Signed: 03/11/2015 9:39:45 AM By: Darren Fudge MD, FACS Entered By: [name] on 03/11/2015 09:39:44

## 2015-03-12 NOTE — Progress Notes (Signed)
[name] ([id number]) Visit Report for 03/11/2015 Arrival Information Details Patient Name: [name]. Date of Service: 03/11/2015 8:45 AM Medical Record Number: [id number] Patient Account Number: [id number] Date of Birth/Sex: 09-Jun-1965 (49 y.o. Male) Treating RN: Montey Hora Primary Care Physician: [name], [place] Other Clinician: Referring Physician: [name], [place] Treating Physician/Extender: Frann Rider in Treatment: 9 Visit Information History Since Last Visit Added or deleted any medications: No Patient Arrived: Ambulatory Any new allergies or adverse reactions: No Arrival Time: 08:51 Had a fall or experienced change in No Accompanied By: self activities of daily living that may affect Transfer Assistance: None risk of falls: Patient Identification Verified: Yes Signs or symptoms of abuse/neglect since last No Secondary Verification Process Yes visito Completed: Hospitalized since last visit: No Patient Requires Transmission-Based No Pain Present Now: No Precautions: Patient Has Alerts: Yes Patient Alerts: DM II Electronic Signature(s) Signed: 03/11/2015 5:57:29 PM By: Montey Hora Entered By: Montey Hora on 03/11/2015 08:52:33 [name] ([id number]) -------------------------------------------------------------------------------- Clinic Level of Care Assessment Details Patient Name: [name] Date of Service: 03/11/2015 8:45 AM Medical Record Number: [id number] Patient Account Number: [id number] Date of Birth/Sex: [birth year] (50 y.o. Male) Treating RN: Montey Hora Primary Care Physician: [name], [place] Other Clinician: Referring Physician: [name], MELINDA Treating Physician/Extender: Frann Rider in Treatment: 9 Clinic Level of Care Assessment Items TOOL 4 Quantity Score _0  - Use when only an EandM is performed on FOLLOW-UP visit 0 ASSESSMENTS - Nursing Assessment / Reassessment X - Reassessment of  Co-morbidities (includes updates in patient status) 1 10 X - Reassessment of Adherence to Treatment Plan 1 5 ASSESSMENTS - Wound and Skin Assessment / Reassessment _1  - Simple Wound Assessment / Reassessment - one wound 0 X - Complex Wound Assessment / Reassessment - multiple wounds 3 5 _2  - Dermatologic / Skin Assessment (not related to wound area) 0 ASSESSMENTS - Focused Assessment X - Circumferential Edema Measurements - multi extremities 1 5 _3  - Nutritional Assessment / Counseling / Intervention 0 X - Lower Extremity Assessment (monofilament, tuning fork, pulses) 1 5 _4  - Peripheral Arterial Disease Assessment (using hand held doppler) 0 ASSESSMENTS - Ostomy and/or Continence Assessment and Care _5  - Incontinence Assessment and Management 0 _6  - Ostomy Care Assessment and Management (repouching, etc.) 0 PROCESS - Coordination of Care X - Simple Patient / Family Education for ongoing care 1 15 _7  - Complex (extensive) Patient / Family Education for ongoing care 0 _8  - Staff obtains Programmer, systems, Records, Test Results / Process Orders 0 _9  - Staff telephones HHA, Nursing Homes / Clarify orders / etc 0 _10  - Routine Transfer to another Facility (non-emergent condition) 0 [name]. ([id number]) _11  - Routine Hospital Admission (non-emergent condition) 0 _12  - New Admissions / Biomedical engineer / Ordering NPWT, Apligraf, etc. 0 _13  - Emergency Hospital Admission (emergent condition) 0 X - Simple Discharge Coordination 1 10 _14  - Complex (extensive) Discharge Coordination 0 PROCESS - Special Needs _15  - Pediatric / Minor Patient Management 0 _16  - Isolation Patient Management 0 _17  - Hearing / Language / Visual special needs 0 _18  - Assessment of Community assistance (transportation, D/C planning, etc.) 0 _19  - Additional assistance / Altered mentation 0 _20  - Support Surface(s) Assessment (bed, cushion, seat, etc.) 0 INTERVENTIONS - Wound Cleansing / Measurement _21  - Simple Wound  Cleansing - one wound 0 X - Complex Wound Cleansing - multiple wounds 3 5 X - Wound Imaging (photographs - any number of wounds) 1 5 _22  - Wound Tracing (  instead of photographs) 0 _0  - Simple Wound Measurement - one wound 0 X - Complex Wound Measurement - multiple wounds 3 5 INTERVENTIONS - Wound Dressings X - Small Wound Dressing one or multiple wounds 3 10 _1  - Medium Wound Dressing one or multiple wounds 0 _2  - Large Wound Dressing one or multiple wounds 0 <[id number]>_6<\/VELFYBOFBPZWCHEN>_2  - Application of Medications - topical 0 <DPOEUMPNTIRWERXV>_4<\/MGQQPYPPJKDTOIZT>_2  - Application of Medications - injection 0 INTERVENTIONS - Miscellaneous _5  - External ear exam 0 [name] ([id number]) _6  - Specimen Collection (cultures, biopsies, blood, body fluids, etc.) 0 _7  - Specimen(s) / Culture(s) sent or taken to Lab for analysis 0 _8  - Patient Transfer (multiple staff / Harrel Lemon Lift / Similar devices) 0 _9  - Simple Staple / Suture removal (25 or less) 0 _10  - Complex Staple / Suture removal (26 or more) 0 _11  - Hypo / Hyperglycemic Management (close monitor of Blood Glucose) 0 _12  - Ankle / Brachial Index (ABI) - do not check if billed separately 0 X - Vital Signs 1 5 Has the patient been seen at the hospital within the last three years: Yes Total Score: 135 Level Of Care: New/Established - Level 4 Electronic Signature(s) Signed: 03/11/2015 5:57:29 PM By: Montey Hora Entered By: Montey Hora on 03/11/2015 09:29:13 [name] ([id number]) -------------------------------------------------------------------------------- Encounter Discharge Information Details Patient Name: [name] Date of Service: 03/11/2015 8:45 AM Medical Record Number: [id number] Patient Account Number: [id number] Date of Birth/Sex: May 08, 1965 (50 y.o. Male) Treating RN: Montey Hora Primary Care Physician: [name], MELINDA Other Clinician: Referring Physician: [name], MELINDA Treating Physician/Extender: Frann Rider in Treatment: 9 Encounter  Discharge Information Items Discharge Pain Level: 0 Discharge Condition: Stable Ambulatory Status: Ambulatory Discharge Destination: Home Transportation: Private Auto Accompanied By: self Schedule Follow-up Appointment: Yes Medication Reconciliation completed and provided to Patient/Care No Daaiel Starlin: Provided on Clinical Summary of Care: 03/11/2015 Form Type Recipient Paper Patient RT Electronic Signature(s) Signed: 03/11/2015 9:48:58 AM By: Ruthine Dose Entered By: Ruthine Dose on 03/11/2015 09:48:58 [name] ([id number]) -------------------------------------------------------------------------------- Lower Extremity Assessment Details Patient Name: [name] Date of Service: 03/11/2015 8:45 AM Medical Record Number: [id number] Patient Account Number: [id number] Date of Birth/Sex: 1965-08-20 (49 y.o. Male) Treating RN: Montey Hora Primary Care Physician: [name], MELINDA Other Clinician: Referring Physician: [name], MELINDA Treating Physician/Extender: Frann Rider in Treatment: 9 Edema Assessment Assessed: [Left: No] [Right: No] Edema: [Left: Ye] [Right: s] Calf Left: Right: Point of Measurement: 34 cm From Medial Instep cm 49 cm Ankle Left: Right: Point of Measurement: 9 cm From Medial Instep cm 31 cm Vascular Assessment Pulses: Posterior Tibial Dorsalis Pedis Palpable: [Right:Yes] Extremity colors, hair growth, and conditions: Extremity Color: [Right:Normal] Hair Growth on Extremity: [Right:No] Temperature of Extremity: [Right:Warm] Capillary Refill: [Right:< 3 seconds] Electronic Signature(s) Signed: 03/11/2015 5:57:29 PM By: Montey Hora Entered By: Montey Hora on 03/11/2015 09:06:21 [name] ([id number]) -------------------------------------------------------------------------------- Multi Wound Chart Details Patient Name: [name] Date of Service: 03/11/2015 8:45 AM Medical Record Number: [id number] Patient  Account Number: [id number] Date of Birth/Sex: 01-12-1966 (49 y.o. Male) Treating RN: Montey Hora Primary Care Physician: [name], MELINDA Other Clinician: Referring Physician: [name], MELINDA Treating Physician/Extender: Frann Rider in Treatment: 9 Vital Signs Height(in): 71 Pulse(bpm): 86 Weight(lbs): 360 Blood Pressure 138/90 (mmHg): Body Mass Index(BMI): 50 Temperature(F): 98.3 Respiratory Rate 20 (breaths/min): Photos: [2:No Photos] [3:No Photos] [4:No Photos] Wound Location: [2:Right Lower Leg - Lateral, Right Lower Leg - Medial Right Lower Leg - Distal Superior] Wounding Event: [2:Gradually Appeared] [3:Gradually  Appeared] [4:Gradually Appeared] Primary Etiology: [2:Venous Leg Ulcer] [3:Venous Leg Ulcer] [4:Venous Leg Ulcer] Comorbid History: [2:Lymphedema, Congestive Lymphedema, Congestive Lymphedema, Congestive Heart Failure, Hypertension, Type II Diabetes, Gout] [3:Heart Failure, Hypertension, Type II Diabetes, Gout] [4:Heart Failure, Hypertension, Type II Diabetes, Gout] Date Acquired: [2:12/13/2014] [3:12/13/2014] [4:12/13/2014] Weeks of Treatment: [2:9] [3:9] [4:9] Wound Status: [2:Open] [3:Open] [4:Open] Measurements L x W x D 0.4x0.4x1.7 [3:0.8x0.6x0.6] [4:0.9x0.7x0.6] (cm) Area (cm) : [2:0.126] [3:0.377] [4:0.495] Volume (cm) : [2:0.214] [3:0.226] [4:0.297] % Reduction in Area: [2:83.90%] [3:80.00%] [4:67.70%] % Reduction in Volume: -36.30% [3:40.10%] [4:2.90%] Classification: [2:Full Thickness Without Exposed Support Structures] [3:Full Thickness Without Exposed Support Structures] [4:Full Thickness Without Exposed Support Structures] HBO Classification: [2:Grade 1] [3:Grade 1] [4:Grade 1] Exudate Amount: [2:Large] [3:Large] [4:Large] Exudate Type: [2:Purulent] [3:Serous] [4:Serous] Exudate Color: [2:yellow, brown, green] [3:amber] [4:amber] Wound Margin: [2:Flat and Intact] [3:Flat and Intact] [4:Flat and Intact] Granulation Amount: [2:Large  (67-100%)] [3:Medium (34-66%)] [4:Medium (34-66%)] Granulation Quality: [2:Pink] [3:Red, Pink] [4:Red, Pink] Necrotic Amount: [2:Small (1-33%)] [3:Medium (34-66%)] [4:Medium (34-66%)] Exposed Structures: TREYLAN, [place] ([id number]) Fascia: No Fascia: No Fascia: No Fat: No Fat: No Fat: No Tendon: No Tendon: No Tendon: No Muscle: No Muscle: No Muscle: No Joint: No Joint: No Joint: No Bone: No Bone: No Bone: No Limited to Skin Limited to Skin Limited to Skin Breakdown Breakdown Breakdown Epithelialization: None None None Periwound Skin Texture: Edema: Yes Edema: Yes Edema: Yes Excoriation: No Excoriation: No Induration: No Induration: No Callus: No Callus: No Crepitus: No Crepitus: No Fluctuance: No Fluctuance: No Friable: No Friable: No Rash: No Rash: No Scarring: No Scarring: No Periwound Skin Moist: Yes Moist: Yes Moist: Yes Moisture: Maceration: No Maceration: No Dry/Scaly: No Dry/Scaly: No Periwound Skin Color: No Abnormalities Noted Atrophie Blanche: No Atrophie Blanche: No Cyanosis: No Cyanosis: No Ecchymosis: No Ecchymosis: No Erythema: No Erythema: No Hemosiderin Staining: No Hemosiderin Staining: No Mottled: No Mottled: No Pallor: No Pallor: No Rubor: No Rubor: No Temperature: No Abnormality N/A No Abnormality Tenderness on Yes Yes Yes Palpation: Wound Preparation: Ulcer Cleansing: Ulcer Cleansing: Ulcer Cleansing: Rinsed/Irrigated with Rinsed/Irrigated with Rinsed/Irrigated with Saline Saline Saline Topical Anesthetic Topical Anesthetic Topical Anesthetic Applied: Other: lidocaine Applied: Other: lidocaine Applied: Other: lidocaine 4% 4% 4% Treatment Notes Electronic Signature(s) Signed: 03/11/2015 9:10:19 AM By: Montey Hora Entered By: Montey Hora on 03/11/2015 09:10:19 [name] ([id number]) -------------------------------------------------------------------------------- [place]  Details Patient Name: [name] Date of Service: 03/11/2015 8:45 AM Medical Record Number: [id number] Patient Account Number: [id number] Date of Birth/Sex: May 02, 1965 (49 y.o. Male) Treating RN: Montey Hora Primary Care Physician: [name], [place] Other Clinician: Referring Physician: [name], MELINDA Treating Physician/Extender: Frann Rider in Treatment: 9 Active Inactive Abuse / Safety / Falls / Self Care Management Nursing Diagnoses: Potential for falls Goals: Patient will remain injury free Date Initiated: 01/01/2015 Goal Status: Active Patient/caregiver will verbalize/demonstrate measures taken to prevent injury and/or falls Date Initiated: 01/01/2015 Goal Status: Active Interventions: Assess fall risk on admission and as needed Notes: Nutrition Nursing Diagnoses: Imbalanced nutrition Goals: Patient/caregiver agrees to and verbalizes understanding of need to use nutritional supplements and/or vitamins as prescribed Date Initiated: 01/01/2015 Goal Status: Active Patient/caregiver verbalizes understanding of need to maintain therapeutic glucose control per primary care physician Date Initiated: 01/01/2015 Goal Status: Active Patient/caregiver will maintain therapeutic glucose control Date Initiated: 01/01/2015 Goal Status: Active Interventions: ODA, [place] ([id number]) Provide education on elevated blood sugars and impact on wound healing Provide education on nutrition Treatment Activities: Education provided on Nutrition : 01/08/2015 Notes: Orientation  to the Wound Care Program Nursing Diagnoses: Knowledge deficit related to the wound healing center program Goals: Patient/caregiver will verbalize understanding of the [place] Program Date Initiated: 01/01/2015 Goal Status: Active Interventions: Provide education on orientation to the wound center Notes: Pain, Acute or Chronic Nursing Diagnoses: Pain, acute or chronic: actual or  potential Goals: Patient will verbalize adequate pain control and receive pain control interventions during procedures as needed Date Initiated: 01/01/2015 Goal Status: Active Patient/caregiver will verbalize adequate pain control between visits Date Initiated: 01/01/2015 Goal Status: Active Patient/caregiver will verbalize comfort level met Date Initiated: 01/01/2015 Goal Status: Active Interventions: Assess comfort goal upon admission Complete pain assessment as per visit requirements Notes: Wound/Skin Impairment CORREY, [place]ID[place] ([id number]) Nursing Diagnoses: Impaired tissue integrity Knowledge deficit related to smoking impact on wound healing Knowledge deficit related to ulceration/compromised skin integrity Goals: Patient will demonstrate a reduced rate of smoking or cessation of smoking Date Initiated: 01/01/2015 Goal Status: Active Patient/caregiver will verbalize understanding of skin care regimen Date Initiated: 01/01/2015 Goal Status: Active Ulcer/skin breakdown will have a volume reduction of 30% by week 4 Date Initiated: 01/01/2015 Goal Status: Active Ulcer/skin breakdown will have a volume reduction of 50% by week 8 Date Initiated: 01/01/2015 Goal Status: Active Ulcer/skin breakdown will have a volume reduction of 80% by week 12 Date Initiated: 01/01/2015 Goal Status: Active Interventions: Assess patient/caregiver ability to obtain necessary supplies Assess patient/caregiver ability to perform ulcer/skin care regimen upon admission and as needed Notes: Electronic Signature(s) Signed: 03/11/2015 9:10:10 AM By: Montey Hora Entered By: Montey Hora on 03/11/2015 09:10:10 [name] ([id number]) -------------------------------------------------------------------------------- Patient/Caregiver Education Details Patient Name: [name] Date of Service: 03/11/2015 8:45 AM Medical Record Number: [id number] Patient Account Number: [id number] Date of  Birth/Gender: 04/02/1965 (50 y.o. Male) Treating RN: Montey Hora Primary Care Physician: [name], [place] Other Clinician: Referring Physician: [name], [place] Treating Physician/Extender: Frann Rider in Treatment: 9 Education Assessment Education Provided To: Patient Education Topics Provided Venous: Handouts: Controlling Swelling with Compression Stockings Methods: Explain/Verbal Responses: State content correctly Electronic Signature(s) Signed: 03/11/2015 5:57:29 PM By: Montey Hora Entered By: Montey Hora on 03/11/2015 09:30:12 [name] ([id number]) -------------------------------------------------------------------------------- Wound Assessment Details Patient Name: [name] Date of Service: 03/11/2015 8:45 AM Medical Record Number: [id number] Patient Account Number: [id number] Date of Birth/Sex: 09-23-1965 (50 y.o. Male) Treating RN: Montey Hora Primary Care Physician: [name], [place] Other Clinician: Referring Physician: [name], MELINDA Treating Physician/Extender: Frann Rider in Treatment: 9 Wound Status Wound Number: 2 Primary Venous Leg Ulcer Etiology: Wound Location: Right Lower Leg - Lateral, Superior Wound Open Status: Wounding Event: Gradually Appeared Comorbid Lymphedema, Congestive Heart Date Acquired: 12/13/2014 History: Failure, Hypertension, Type II Weeks Of Treatment: 9 Diabetes, Gout Clustered Wound: No Photos Photo Uploaded By: Montey Hora on 03/11/2015 12:07:06 Wound Measurements Length: (cm) 0.4 Width: (cm) 0.4 Depth: (cm) 1.7 Area: (cm) 0.126 Volume: (cm) 0.214 % Reduction in Area: 83.9% % Reduction in Volume: -36.3% Epithelialization: None Tunneling: No Undermining: No Wound Description Full Thickness Without Classification: Exposed Support Structures Diabetic Severity Grade 1 (Wagner): Wound Margin: Flat and Intact Exudate Amount: Large Exudate Type: Purulent Exudate Color: yellow,  brown, green Foul Odor After Cleansing: No Wound Bed Granulation Amount: Large (67-100%) Exposed Structure CODIE, KROGH. ([id number]) Granulation Quality: Pink Fascia Exposed: No Necrotic Amount: Small (1-33%) Fat Layer Exposed: No Necrotic Quality: Adherent Slough Tendon Exposed: No Muscle Exposed: No Joint Exposed: No Bone Exposed: No Limited to Skin Breakdown Periwound Skin Texture Texture Color No  Abnormalities Noted: No No Abnormalities Noted: No Localized Edema: Yes Temperature / Pain Moisture Temperature: No Abnormality No Abnormalities Noted: No Tenderness on Palpation: Yes Moist: Yes Wound Preparation Ulcer Cleansing: Rinsed/Irrigated with Saline Topical Anesthetic Applied: Other: lidocaine 4%, Treatment Notes Wound #2 (Right, Lateral, Superior Lower Leg) 1. Cleansed with: Clean wound with Normal Saline 2. Anesthetic Topical Lidocaine 4% cream to wound bed prior to debridement 4. Dressing Applied: Aquacel Ag 5. Secondary [place] 7. Secured with Patient to wear own compression stockings Electronic Signature(s) Signed: 03/11/2015 9:09:28 AM By: Montey Hora Entered By: Montey Hora on 03/11/2015 09:09:28 [name] ([id number]) -------------------------------------------------------------------------------- Wound Assessment Details Patient Name: [name] Date of Service: 03/11/2015 8:45 AM Medical Record Number: [id number] Patient Account Number: [id number] Date of Birth/Sex: [birth year] (50 y.o. Male) Treating RN: Montey Hora Primary Care Physician: [name], [place] Other Clinician: Referring Physician: [name], [place] Treating Physician/Extender: Frann Rider in Treatment: 9 Wound Status Wound Number: 3 Primary Venous Leg Ulcer Etiology: Wound Location: Right Lower Leg - Medial Wound Open Wounding Event: Gradually Appeared Status: Date Acquired: 12/13/2014 Comorbid Lymphedema, Congestive  Heart Weeks Of Treatment: 9 History: Failure, Hypertension, Type II Clustered Wound: No Diabetes, Gout Photos Photo Uploaded By: Montey Hora on 03/11/2015 12:07:38 Wound Measurements Length: (cm) 0.8 Width: (cm) 0.6 Depth: (cm) 0.6 Area: (cm) 0.377 Volume: (cm) 0.226 % Reduction in Area: 80% % Reduction in Volume: 40.1% Epithelialization: None Tunneling: No Undermining: No Wound Description Full Thickness Without Classification: Exposed Support Structures Diabetic Severity Grade 1 (Wagner): Wound Margin: Flat and Intact Exudate Amount: Large Exudate Type: Serous Exudate Color: amber Foul Odor After Cleansing: No Wound Bed Granulation Amount: Medium (34-66%) Exposed Structure LYNDALL, WINDT. ([id number]) Granulation Quality: Red, Pink Fascia Exposed: No Necrotic Amount: Medium (34-66%) Fat Layer Exposed: No Necrotic Quality: Adherent Slough Tendon Exposed: No Muscle Exposed: No Joint Exposed: No Bone Exposed: No Limited to Skin Breakdown Periwound Skin Texture Texture Color No Abnormalities Noted: No No Abnormalities Noted: No Callus: No Atrophie Blanche: No Crepitus: No Cyanosis: No Excoriation: No Ecchymosis: No Fluctuance: No Erythema: No Friable: No Hemosiderin Staining: No Induration: No Mottled: No Localized Edema: Yes Pallor: No Rash: No Rubor: No Scarring: No Temperature / Pain Moisture Tenderness on Palpation: Yes No Abnormalities Noted: No Dry / Scaly: No Maceration: No Moist: Yes Wound Preparation Ulcer Cleansing: Rinsed/Irrigated with Saline Topical Anesthetic Applied: Other: lidocaine 4%, Treatment Notes Wound #3 (Right, Medial Lower Leg) 1. Cleansed with: Clean wound with Normal Saline 2. Anesthetic Topical Lidocaine 4% cream to wound bed prior to debridement 4. Dressing Applied: Aquacel Ag 5. Secondary [place] 7. Secured with Patient to wear own compression stockings Electronic  Signature(s) Signed: 03/11/2015 9:09:42 AM By: Montey Hora Entered By: Montey Hora on 03/11/2015 09:09:42 [name] ([name] ([id number]) -------------------------------------------------------------------------------- Wound Assessment Details Patient Name: [name] Date of Service: 03/11/2015 8:45 AM Medical Record Number: [id number] Patient Account Number: [id number] Date of Birth/Sex: September 12, 1965 (49 y.o. Male) Treating RN: Montey Hora Primary Care Physician: [name], [place] Other Clinician: Referring Physician: [name], MELINDA Treating Physician/Extender: Frann Rider in Treatment: 9 Wound Status Wound Number: 4 Primary Venous Leg Ulcer Etiology: Wound Location: Right Lower Leg - Distal Wound Open Wounding Event: Gradually Appeared Status: Date Acquired: 12/13/2014 Comorbid Lymphedema, Congestive Heart Weeks Of Treatment: 9 History: Failure, Hypertension, Type II Clustered Wound: No Diabetes, Gout Photos Photo Uploaded By: Montey Hora on 03/11/2015 12:07:39 Wound Measurements Length: (cm) 0.9 Width: (cm)  0.7 Depth: (cm) 0.6 Area: (cm) 0.495 Volume: (cm) 0.297 % Reduction in Area: 67.7% % Reduction in Volume: 2.9% Epithelialization: None Tunneling: No Undermining: No Wound Description Full Thickness Without Classification: Exposed Support Structures Diabetic Severity Grade 1 (Wagner): Wound Margin: Flat and Intact Exudate Amount: Large Exudate Type: Serous Exudate Color: amber Foul Odor After Cleansing: No Wound Bed Granulation Amount: Medium (34-66%) Exposed Structure THERESA, DOHRMAN. ([id number]) Granulation Quality: Red, Pink Fascia Exposed: No Necrotic Amount: Medium (34-66%) Fat Layer Exposed: No Necrotic Quality: Adherent Slough Tendon Exposed: No Muscle Exposed: No Joint Exposed: No Bone Exposed: No Limited to Skin Breakdown Periwound Skin Texture Texture Color No Abnormalities  Noted: No No Abnormalities Noted: No Callus: No Atrophie Blanche: No Crepitus: No Cyanosis: No Excoriation: No Ecchymosis: No Fluctuance: No Erythema: No Friable: No Hemosiderin Staining: No Induration: No Mottled: No Localized Edema: Yes Pallor: No Rash: No Rubor: No Scarring: No Temperature / Pain Moisture Temperature: No Abnormality No Abnormalities Noted: No Tenderness on Palpation: Yes Dry / Scaly: No Maceration: No Moist: Yes Wound Preparation Ulcer Cleansing: Rinsed/Irrigated with Saline Topical Anesthetic Applied: Other: lidocaine 4%, Treatment Notes Wound #4 (Right, Distal Lower Leg) 1. Cleansed with: Clean wound with Normal Saline 2. Anesthetic Topical Lidocaine 4% cream to wound bed prior to debridement 4. Dressing Applied: Aquacel Ag 5. Secondary [place] 7. Secured with Patient to wear own compression stockings Electronic Signature(s) Signed: 03/11/2015 9:09:56 AM By: Montey Hora Entered By: Montey Hora on 03/11/2015 09:09:56 [name] ([id number]) [name] ([id number]) -------------------------------------------------------------------------------- [place] Details Patient Name: [name] Date of Service: 03/11/2015 8:45 AM Medical Record Number: [id number] Patient Account Number: [id number] Date of Birth/Sex: 1965/07/31 (49 y.o. Male) Treating RN: Montey Hora Primary Care Physician: [name], [place] Other Clinician: Referring Physician: [name], [place] Treating Physician/Extender: Frann Rider in Treatment: 9 Vital Signs Time Taken: 08:52 Temperature (F): 98.3 Height (in): 71 Pulse (bpm): 86 Weight (lbs): 360 Respiratory Rate (breaths/min): 20 Body Mass Index (BMI): 50.2 Blood Pressure (mmHg): 138/90 Reference Range: 80 - 120 mg / dl Electronic Signature(s) Signed: 03/11/2015 5:57:29 PM By: Montey Hora Entered By: Montey Hora on 03/11/2015 08:55:17

## 2015-03-15 ENCOUNTER — Other Ambulatory Visit: Payer: Self-pay | Admitting: Family Medicine

## 2015-03-15 NOTE — Telephone Encounter (Signed)
Patient is NON-compliant I saw him in December and he was supposed to return one month later (around Jan 23rd) for a 30 minute visit for diabetes and he was to bring in his glucose readings He did not come in, and has in fact cancelled three appointments with me since the December visit (Jan 30th, Feb 1st, Feb 10th) We have had discussions before about non-compliance I just can't continue to treat him for diabetes if he won't work with me and come in when asked Let's please discuss this

## 2015-03-18 ENCOUNTER — Encounter: Payer: Managed Care, Other (non HMO) | Admitting: Surgery

## 2015-03-18 ENCOUNTER — Other Ambulatory Visit: Payer: Self-pay | Admitting: Family Medicine

## 2015-03-18 DIAGNOSIS — E11622 Type 2 diabetes mellitus with other skin ulcer: Secondary | ICD-10-CM | POA: Diagnosis not present

## 2015-03-19 ENCOUNTER — Telehealth: Payer: Self-pay

## 2015-03-19 NOTE — Progress Notes (Signed)
DERAN, ABBASI ([id number]) Visit Report for 03/18/2015 Chief Complaint Document Details Patient Name: [name], [name] Date of Service: 03/18/2015 8:00 AM Medical Record Number: [id number] Patient Account Number: [id number] Date of Birth/Sex: 03-Jan-1966 (49 y.o. Male) Treating RN: Montey Hora Primary Care Physician: [name], [facility] Other Clinician: Referring Physician: [name], MELINDA Treating Physician/Extender: Frann Rider in Treatment: 10 Information Obtained from: Patient Chief Complaint Patients presents for treatment of an open diabetic ulcer over the right lower extremity with swelling of both legs for about 1 month. Electronic Signature(s) Signed: 03/18/2015 8:58:13 AM By: Christin Fudge MD, FACS Entered By: Christin Fudge on 03/18/2015 08:58:13 [name] ([id number]) -------------------------------------------------------------------------------- HPI Details Patient Name: [name] Date of Service: 03/18/2015 8:00 AM Medical Record Number: [id number] Patient Account Number: [id number] Date of Birth/Sex: 08-10-1965 (49 y.o. Male) Treating RN: Montey Hora Primary Care Physician: [name], [place] Other Clinician: Referring Physician: [name], MELINDA Treating Physician/Extender: Frann Rider in Treatment: 10 History of Present Illness Location: Started getting pustules and boils on his right lower extremity along with swelling and was treated for this Quality: Patient reports experiencing a dull pain to affected area(s). Severity: Patient states wound are getting worse. Duration: Patient has had the wound for < 4 weeks prior to presenting for treatment Timing: Pain in wound is Intermittent (comes and goes Context: The wound appeared gradually over time Modifying Factors: Other treatment(s) tried include: local care and antibiotics and some compression stockings. Associated Signs and Symptoms: Patient reports having difficulty standing for  long periods. HPI Description: 49 year old gentleman who has type 2 diabetes mellitus and morbid obesity has been having cellulitis and abscess of the right lower leg for the last 3 weeks. He has had multiple antibiotics and an IandD and over the last 10 days has been on Septra DS and doxycycline and prior to that Keflex. the patient is a poor historian and does not give a definite timeline but says he's been diagnosed with diabetes mellitus for about 8-10 months and has been on insulin. He does not measure his blood sugars regularly nor does he see his PCP regularly. Also has congestive heart failure for which he sees a cardiologist at [facility] but this is going to change as the cardiologist as retired. He is a smoker and smokes several cigars a day and drinks alcohol occasionally. He has been morbidly obese for several years. 01/08/2015 -- the patient complains about having to use his 3 layer compression wrap and has multiple issues with this none of them being medically oriented. He also continues to smoke, has not yet seen his cardiologist or his PCP and in general has continued to not look after himself. 01/15/2015 -- the patient did not allow [place] to put his 3 layer compression wrap last week and has been using his own compression stockings. Continues to smoke. He did see his cardiologist and they have increased his dose of Lasix, asked him to repeat an echo, lose weight and be compliant with his diabetic control. 01/29/2015 -- he has given up smoking for about the last week. He is unable to keep his appointment on 01/30/2015 with the vascular surgeons and has rescheduled it for next week. 02/12/2015 -- he continues to have a lot of edema and drains from the wounds. However he is not agreeable to wear 3 or 4 layer compression wraps. He wants to wear his 20-30 mm compression stockings only. 02/19/2015 -- he was seen by the vascular surgeon Dr. Delana Meyer on 02/18/2015 and a  duplex  ultrasound of the lower extremities shows normal deep venous system, superficial reflux is not present in the great saphenous veins bilaterally and the left small saphenous vein. There is reflux in the small saphenous vein on the right and there are several sporadic varicosities and the distal thigh on the right. He was not offered surgery but graduated compression stockings class I were recommended on a daily basis and was given a [name], [name]. ([id number]) prescription for this. He would follow-up with them in 3 months for possible using of lymph pumps at that time. Electronic Signature(s) Signed: 03/18/2015 8:58:19 AM By: Christin Fudge MD, FACS Entered By: Christin Fudge on 03/18/2015 08:58:19 [name] ([id number]) -------------------------------------------------------------------------------- Physical Exam Details Patient Name: [name] Date of Service: 03/18/2015 8:00 AM Medical Record Number: [id number] Patient Account Number: [id number] Date of Birth/Sex: 25-May-1965 (49 y.o. Male) Treating RN: Montey Hora Primary Care Physician: [name], [place] Other Clinician: Referring Physician: [name], MELINDA Treating Physician/Extender: Frann Rider in Treatment: 10 Constitutional . Pulse regular. Respirations normal and unlabored. Afebrile. . Eyes Nonicteric. Reactive to light. Ears, Nose, Mouth, and Throat Lips, teeth, and gums WNL.[name] Kitchen Moist mucosa without lesions. Neck supple and nontender. No palpable supraclavicular or cervical adenopathy. Normal sized without goiter. Respiratory WNL. No retractions.. Cardiovascular Pedal Pulses WNL. No clubbing, cyanosis or edema. Lymphatic No adneopathy. No adenopathy. No adenopathy. Musculoskeletal Adexa without tenderness or enlargement.. Digits and nails w/o clubbing, cyanosis, infection, petechiae, ischemia, or inflammatory conditions.. Integumentary (Hair, Skin) No suspicious lesions. No crepitus or  fluctuance. No peri-wound warmth or erythema. No masses.[name] Kitchen Psychiatric Judgement and insight Intact.. No evidence of depression, anxiety, or agitation.. Notes his wounds all look better than the depth has decreased significantly. He continues to have seropurulent material draining from the wounds and we will continue local care as before. Electronic Signature(s) Signed: 03/18/2015 8:58:55 AM By: Christin Fudge MD, FACS Entered By: Christin Fudge on 03/18/2015 08:58:54 [name] ([id number]) -------------------------------------------------------------------------------- Physician Orders Details Patient Name: [name] Date of Service: 03/18/2015 8:00 AM Medical Record Number: [id number] Patient Account Number: [id number] Date of Birth/Sex: 10-Mar-1965 (49 y.o. Male) Treating RN: Montey Hora Primary Care Physician: [name], [place] Other Clinician: Referring Physician: [name], MELINDA Treating Physician/Extender: Frann Rider in Treatment: 10 Verbal / Phone Orders: Yes Clinician: Montey Hora Read Back and Verified: Yes Diagnosis Coding Wound Cleansing Wound #2 Right,Lateral,Superior Lower Leg o Cleanse wound with mild soap and water o May Shower, gently pat wound dry prior to applying new dressing. Wound #3 Right,Medial Lower Leg o Cleanse wound with mild soap and water o May Shower, gently pat wound dry prior to applying new dressing. Wound #4 Right,Distal Lower Leg o Cleanse wound with mild soap and water o May Shower, gently pat wound dry prior to applying new dressing. Anesthetic Wound #2 Right,Lateral,Superior Lower Leg o Topical Lidocaine 4% cream applied to wound bed prior to debridement Wound #3 Right,Medial Lower Leg o Topical Lidocaine 4% cream applied to wound bed prior to debridement Wound #4 Right,Distal Lower Leg o Topical Lidocaine 4% cream applied to wound bed prior to debridement Primary Wound Dressing Wound #2  Right,Lateral,Superior Lower Leg o Aquacel Ag Wound #3 Right,Medial Lower Leg o Aquacel Ag Wound #4 Right,Distal Lower Leg o Aquacel Ag Secondary Dressing Wound #2 Right,Lateral,Superior Lower Leg o Non-adherent pad - [place] ([id number]) Wound #3 Right,Medial Lower Leg o Non-adherent pad - [place] Wound #4 Right,Distal Lower Leg o Non-adherent pad - [place]  Dressing Change Frequency Wound #2 Right,Lateral,Superior Lower Leg o Change dressing every other day. Wound #3 Right,Medial Lower Leg o Change dressing every other day. Wound #4 Right,Distal Lower Leg o Change dressing every other day. Follow-up Appointments Wound #2 Right,Lateral,Superior Lower Leg o Return Appointment in 1 week. Wound #3 Right,Medial Lower Leg o Return Appointment in 1 week. Wound #4 Right,Distal Lower Leg o Return Appointment in 1 week. Edema Control Wound #2 Right,Lateral,Superior Lower Leg o Patient to wear own compression stockings o Elevate legs to the level of the heart and pump ankles as often as possible Wound #3 Right,Medial Lower Leg o Patient to wear own compression stockings o Elevate legs to the level of the heart and pump ankles as often as possible Wound #4 Right,Distal Lower Leg o Patient to wear own compression stockings o Elevate legs to the level of the heart and pump ankles as often as possible Electronic Signature(s) Signed: 03/18/2015 2:58:24 PM By: Christin Fudge MD, FACS Signed: 03/18/2015 5:40:21 PM By: Montey Hora Entered By: Montey Hora on 03/18/2015 08:43:17 [name] ([id number]) -------------------------------------------------------------------------------- Problem List Details Patient Name: [name] Date of Service: 03/18/2015 8:00 AM Medical Record Number: [id number] Patient Account Number: [id number] Date of Birth/Sex: September 17, 1965 (49 y.o. Male) Treating RN: Montey Hora Primary Care Physician: [name], MELINDA Other Clinician: Referring Physician: [name], MELINDA Treating Physician/Extender: Frann Rider in Treatment: 10 Active Problems ICD-10 Encounter Code Description Active Date Diagnosis E11.622 Type 2 diabetes mellitus with other skin ulcer 01/01/2015 Yes E66.01 Morbid (severe) obesity due to excess calories 01/01/2015 Yes L97.212 Non-pressure chronic ulcer of right calf with fat layer 02/05/2015 Yes exposed I89.0 Lymphedema, not elsewhere classified 01/01/2015 Yes I50.40 Unspecified combined systolic (congestive) and diastolic 99991111 Yes (congestive) heart failure F17.218 Nicotine dependence, cigarettes, with other nicotine- 01/01/2015 Yes induced disorders 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 02/19/2015 Yes Inactive Problems Resolved Problems ICD-10 Code Description Active Date Resolved Date L02.415 Cutaneous abscess of right lower limb 01/01/2015 01/01/2015 [name] ([id number]) Electronic Signature(s) Signed: 03/18/2015 8:58:06 AM By: Christin Fudge MD, FACS Entered By: Christin Fudge on 03/18/2015 08:58:06 [name] ([id number]) -------------------------------------------------------------------------------- Progress Note Details Patient Name: [name] Date of Service: 03/18/2015 8:00 AM Medical Record Number: [id number] Patient Account Number: [id number] Date of Birth/Sex: September 16, 1965 (50 y.o. Male) Treating RN: Montey Hora Primary Care Physician: [name], MELINDA Other Clinician: Referring Physician: [name], MELINDA Treating Physician/Extender: Frann Rider in Treatment: 10 Subjective Chief Complaint Information obtained from Patient Patients presents for treatment of an open diabetic ulcer over the right lower extremity with swelling of both legs for about 1 month. History of Present Illness (HPI) The following HPI elements were documented for the patient's  wound: Location: Started getting pustules and boils on his right lower extremity along with swelling and was treated for this Quality: Patient reports experiencing a dull pain to affected area(s). Severity: Patient states wound are getting worse. Duration: Patient has had the wound for < 4 weeks prior to presenting for treatment Timing: Pain in wound is Intermittent (comes and goes Context: The wound appeared gradually over time Modifying Factors: Other treatment(s) tried include: local care and antibiotics and some compression stockings. Associated Signs and Symptoms: Patient reports having difficulty standing for long periods. 50 year old gentleman who has type 2 diabetes mellitus and morbid obesity has been having cellulitis and abscess of the right lower leg for the last 3 weeks. He has had multiple antibiotics and an IandD and  over the last 10 days has been on Septra DS and doxycycline and prior to that Keflex. the patient is a poor historian and does not give a definite timeline but says he's been diagnosed with diabetes mellitus for about 8-10 months and has been on insulin. He does not measure his blood sugars regularly nor does he see his PCP regularly. Also has congestive heart failure for which he sees a cardiologist at [facility] but this is going to change as the cardiologist as retired. He is a smoker and smokes several cigars a day and drinks alcohol occasionally. He has been morbidly obese for several years. 01/08/2015 -- the patient complains about having to use his 3 layer compression wrap and has multiple issues with this none of them being medically oriented. He also continues to smoke, has not yet seen his cardiologist or his PCP and in general has continued to not look after himself. 01/15/2015 -- the patient did not allow [place] to put his 3 layer compression wrap last week and has been using his own compression stockings. Continues to smoke. He did see his cardiologist  and they have increased his dose of Lasix, asked him to repeat an echo, lose weight and be compliant with his diabetic control. 01/29/2015 -- he has given up smoking for about the last week. He is unable to keep his appointment on 01/30/2015 with the vascular surgeons and has rescheduled it for next week. 02/12/2015 -- he continues to have a lot of edema and drains from the wounds. However he is not [name], [name]. ([id number]) agreeable to wear 3 or 4 layer compression wraps. He wants to wear his 20-30 mm compression stockings only. 02/19/2015 -- he was seen by the vascular surgeon Dr. Delana Meyer on 02/18/2015 and a duplex ultrasound of the lower extremities shows normal deep venous system, superficial reflux is not present in the great saphenous veins bilaterally and the left small saphenous vein. There is reflux in the small saphenous vein on the right and there are several sporadic varicosities and the distal thigh on the right. He was not offered surgery but graduated compression stockings class I were recommended on a daily basis and was given a prescription for this. He would follow-up with them in 3 months for possible using of lymph pumps at that time. Objective Constitutional Pulse regular. Respirations normal and unlabored. Afebrile. Vitals Time Taken: 8:20 AM, Height: 71 in, Weight: 360 lbs, BMI: 50.2, Temperature: 98.5 F, Pulse: 71 bpm, Respiratory Rate: 20 breaths/min, Blood Pressure: 142/97 mmHg. Eyes Nonicteric. Reactive to light. Ears, Nose, Mouth, and Throat Lips, teeth, and gums WNL.[name] Kitchen Moist mucosa without lesions. Neck supple and nontender. No palpable supraclavicular or cervical adenopathy. Normal sized without goiter. Respiratory WNL. No retractions.. Cardiovascular Pedal Pulses WNL. No clubbing, cyanosis or edema. Lymphatic No adneopathy. No adenopathy. No adenopathy. Musculoskeletal Adexa without tenderness or enlargement.. Digits and nails w/o clubbing,  cyanosis, infection, petechiae, ischemia, or inflammatory conditions.[name] Kitchen Psychiatric Judgement and insight Intact.. No evidence of depression, anxiety, or agitation.[name] Kitchen [name], [name] ([id number]) General Notes: his wounds all look better than the depth has decreased significantly. He continues to have seropurulent material draining from the wounds and we will continue local care as before. Integumentary (Hair, Skin) No suspicious lesions. No crepitus or fluctuance. No peri-wound warmth or erythema. No masses.. Wound #2 status is Open. Original cause of wound was Gradually Appeared. The wound is located on the Right,Lateral,Superior Lower Leg. The wound measures 0.4cm length x 0.4cm width x  1.4cm depth; 0.126cm^2 area and 0.176cm^3 volume. The wound is limited to skin breakdown. There is no tunneling or undermining noted. There is a large amount of purulent drainage noted. The wound margin is flat and intact. There is large (67-100%) pink granulation within the wound bed. There is a small (1-33%) amount of necrotic tissue within the wound bed including Adherent Slough. The periwound skin appearance exhibited: Localized Edema, Moist. Periwound temperature was noted as No Abnormality. The periwound has tenderness on palpation. Wound #3 status is Open. Original cause of wound was Gradually Appeared. The wound is located on the Right,Medial Lower Leg. The wound measures 0.5cm length x 0.4cm width x 0.5cm depth; 0.157cm^2 area and 0.079cm^3 volume. The wound is limited to skin breakdown. There is no tunneling or undermining noted. There is a large amount of serous drainage noted. The wound margin is flat and intact. There is medium (34-66%) red, pink granulation within the wound bed. There is a medium (34-66%) amount of necrotic tissue within the wound bed including Adherent Slough. The periwound skin appearance exhibited: Localized Edema, Moist. The periwound skin appearance did not exhibit:  Callus, Crepitus, Excoriation, Fluctuance, Friable, Induration, Rash, Scarring, Dry/Scaly, Maceration, Atrophie Blanche, Cyanosis, Ecchymosis, Hemosiderin Staining, Mottled, Pallor, Rubor, Erythema. The periwound has tenderness on palpation. Wound #4 status is Open. Original cause of wound was Gradually Appeared. The wound is located on the Right,Distal Lower Leg. The wound measures 0.3cm length x 0.4cm width x 0.4cm depth; 0.094cm^2 area and 0.038cm^3 volume. The wound is limited to skin breakdown. There is no tunneling or undermining noted. There is a large amount of serous drainage noted. The wound margin is flat and intact. There is medium (34-66%) red, pink granulation within the wound bed. There is a medium (34-66%) amount of necrotic tissue within the wound bed including Adherent Slough. The periwound skin appearance exhibited: Localized Edema, Moist. The periwound skin appearance did not exhibit: Callus, Crepitus, Excoriation, Fluctuance, Friable, Induration, Rash, Scarring, Dry/Scaly, Maceration, Atrophie Blanche, Cyanosis, Ecchymosis, Hemosiderin Staining, Mottled, Pallor, Rubor, Erythema. Periwound temperature was noted as No Abnormality. The periwound has tenderness on palpation. Assessment Active Problems ICD-10 E11.622 - Type 2 diabetes mellitus with other skin ulcer E66.01 - Morbid (severe) obesity due to excess calories L97.212 - Non-pressure chronic ulcer of right calf with fat layer exposed I89.0 - Lymphedema, not elsewhere classified [name], [name] ([id number]) I50.40 - Unspecified combined systolic (congestive) and diastolic (congestive) heart failure F17.218 - Nicotine dependence, cigarettes, with other nicotine-induced disorders I83.012 - 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 Plan Wound Cleansing: Wound #2 Right,Lateral,Superior Lower Leg: Cleanse wound with mild soap and water May Shower, gently pat wound dry prior to applying new  dressing. Wound #3 Right,Medial Lower Leg: Cleanse wound with mild soap and water May Shower, gently pat wound dry prior to applying new dressing. Wound #4 Right,Distal Lower Leg: Cleanse wound with mild soap and water May Shower, gently pat wound dry prior to applying new dressing. Anesthetic: Wound #2 Right,Lateral,Superior Lower Leg: Topical Lidocaine 4% cream applied to wound bed prior to debridement Wound #3 Right,Medial Lower Leg: Topical Lidocaine 4% cream applied to wound bed prior to debridement Wound #4 Right,Distal Lower Leg: Topical Lidocaine 4% cream applied to wound bed prior to debridement Primary Wound Dressing: Wound #2 Right,Lateral,Superior Lower Leg: Aquacel Ag Wound #3 Right,Medial Lower Leg: Aquacel Ag Wound #4 Right,Distal Lower Leg: Aquacel Ag Secondary Dressing: Wound #2 Right,Lateral,Superior Lower Leg: Non-adherent pad - [place] Wound #3 Right,Medial  Lower Leg: Non-adherent pad - [place] Wound #4 Right,Distal Lower Leg: Non-adherent pad - [place] Dressing Change Frequency: Wound #2 Right,Lateral,Superior Lower Leg: Change dressing every other day. Wound #3 Right,Medial Lower Leg: Change dressing every other day. Wound #4 Right,Distal Lower Leg: Change dressing every other day. Follow-up Appointments: [name], [name] ([id number]) Wound #2 Right,Lateral,Superior Lower Leg: Return Appointment in 1 week. Wound #3 Right,Medial Lower Leg: Return Appointment in 1 week. Wound #4 Right,Distal Lower Leg: Return Appointment in 1 week. Edema Control: Wound #2 Right,Lateral,Superior Lower Leg: Patient to wear own compression stockings Elevate legs to the level of the heart and pump ankles as often as possible Wound #3 Right,Medial Lower Leg: Patient to wear own compression stockings Elevate legs to the level of the heart and pump ankles as often as possible Wound #4 Right,Distal Lower Leg: Patient to wear own compression  stockings Elevate legs to the level of the heart and pump ankles as often as possible we have gone over elevation and exercise and also discussed his use of diuretics, proper control of his diabetes and vitamin supplements. Local care will continue with packing of silver alginate and using his compression stockings. Come back and see me next week. Electronic Signature(s) Signed: 03/18/2015 8:59:45 AM By: Christin Fudge MD, FACS Entered By: Christin Fudge on 03/18/2015 08:59:45 [name] ([id number]) -------------------------------------------------------------------------------- SuperBill Details Patient Name: [name] Date of Service: 03/18/2015 Medical Record Number: [id number] Patient Account Number: [id number] Date of Birth/Sex: 1965/07/30 (49 y.o. Male) Treating RN: Montey Hora Primary Care Physician: [name], [place] Other Clinician: Referring Physician: [name], MELINDA Treating Physician/Extender: Frann Rider in Treatment: 10 Diagnosis Coding ICD-10 Codes Code Description E11.622 Type 2 diabetes mellitus with other skin ulcer E66.01 Morbid (severe) obesity due to excess calories L97.212 Non-pressure chronic ulcer of right calf with fat layer exposed I89.0 Lymphedema, not elsewhere classified I50.40 Unspecified combined systolic (congestive) and diastolic (congestive) heart failure F17.218 Nicotine dependence, cigarettes, with other nicotine-induced disorders 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 Facility Procedures CPT4 Code: [id number] Description: 99214 - WOUND CARE VISIT-LEV 4 EST PT Modifier: Quantity: 1 Physician Procedures CPT4 Code: [id number] Description: 99213 - WC PHYS LEVEL 3 - EST PT ICD-10 Description Diagnosis E11.622 Type 2 diabetes mellitus with other skin ulcer E66.01 Morbid (severe) obesity due to excess calories I89.0 Lymphedema, not elsewhere classified L97.212 Non-pressure  chronic ulcer of right calf with  fat Modifier: layer exposed Quantity: 1 Electronic Signature(s) Signed: 03/18/2015 9:00:00 AM By: Christin Fudge MD, FACS Entered By: Christin Fudge on 03/18/2015 09:00:00

## 2015-03-19 NOTE — Telephone Encounter (Signed)
Letter being sent; pt noncompliant; I'm sending one month of meds

## 2015-03-19 NOTE — Progress Notes (Signed)
SISTO, GRANILLO ([id number]) Visit Report for 03/18/2015 Arrival Information Details Patient Name: [name], [name]. Date of Service: 03/18/2015 8:00 AM Medical Record Number: [id number] Patient Account Number: [id number] Date of Birth/Sex: 05-Mar-1965 (49 y.o. Male) Treating RN: Montey Hora Primary Care Physician: [name], [place] Other Clinician: Referring Physician: [name], [place] Treating Physician/Extender: Frann Rider in Treatment: 10 Visit Information History Since Last Visit Added or deleted any medications: No Patient Arrived: Ambulatory Any new allergies or adverse reactions: No Arrival Time: 08:14 Had a fall or experienced change in No Accompanied By: self activities of daily living that may affect Transfer Assistance: None risk of falls: Patient Identification Verified: Yes Signs or symptoms of abuse/neglect since last No Secondary Verification Process Yes visito Completed: Hospitalized since last visit: No Patient Requires Transmission-Based No Pain Present Now: No Precautions: Patient Has Alerts: Yes Patient Alerts: DM II Electronic Signature(s) Signed: 03/18/2015 5:40:21 PM By: Montey Hora Entered By: Montey Hora on 03/18/2015 08:18:09 [name] ([id number]) -------------------------------------------------------------------------------- Clinic Level of Care Assessment Details Patient Name: [name] Date of Service: 03/18/2015 8:00 AM Medical Record Number: [id number] Patient Account Number: [id number] Date of Birth/Sex: [birth year] (50 y.o. Male) Treating RN: Montey Hora Primary Care Physician: [name], [place] Other Clinician: Referring Physician: [name], MELINDA Treating Physician/Extender: Frann Rider in Treatment: 10 Clinic Level of Care Assessment Items TOOL 4 Quantity Score _0  - Use when only an EandM is performed on FOLLOW-UP visit 0 ASSESSMENTS - Nursing Assessment / Reassessment X - Reassessment of  Co-morbidities (includes updates in patient status) 1 10 X - Reassessment of Adherence to Treatment Plan 1 5 ASSESSMENTS - Wound and Skin Assessment / Reassessment _1  - Simple Wound Assessment / Reassessment - one wound 0 X - Complex Wound Assessment / Reassessment - multiple wounds 3 5 _2  - Dermatologic / Skin Assessment (not related to wound area) 0 ASSESSMENTS - Focused Assessment _3  - Circumferential Edema Measurements - multi extremities 0 _4  - Nutritional Assessment / Counseling / Intervention 0 X - Lower Extremity Assessment (monofilament, tuning fork, pulses) 1 5 _5  - Peripheral Arterial Disease Assessment (using hand held doppler) 0 ASSESSMENTS - Ostomy and/or Continence Assessment and Care _6  - Incontinence Assessment and Management 0 _7  - Ostomy Care Assessment and Management (repouching, etc.) 0 PROCESS - Coordination of Care X - Simple Patient / Family Education for ongoing care 1 15 _8  - Complex (extensive) Patient / Family Education for ongoing care 0 _9  - Staff obtains Programmer, systems, Records, Test Results / Process Orders 0 _10  - Staff telephones HHA, Nursing Homes / Clarify orders / etc 0 _11  - Routine Transfer to another Facility (non-emergent condition) 0 [name], [name]. ([id number]) _12  - Routine Hospital Admission (non-emergent condition) 0 _13  - New Admissions / Biomedical engineer / Ordering NPWT, Apligraf, etc. 0 _14  - Emergency Hospital Admission (emergent condition) 0 X - Simple Discharge Coordination 1 10 _15  - Complex (extensive) Discharge Coordination 0 PROCESS - Special Needs _16  - Pediatric / Minor Patient Management 0 _17  - Isolation Patient Management 0 _18  - Hearing / Language / Visual special needs 0 _19  - Assessment of Community assistance (transportation, D/C planning, etc.) 0 _20  - Additional assistance / Altered mentation 0 _21  - Support Surface(s) Assessment (bed, cushion, seat, etc.) 0 INTERVENTIONS - Wound Cleansing / Measurement _22  - Simple Wound  Cleansing - one wound 0 X - Complex Wound Cleansing - multiple wounds 3 5 X - Wound Imaging (photographs - any number of wounds) 1 5 _23  - Wound Tracing (instead  of photographs) 0 _0  - Simple Wound Measurement - one wound 0 X - Complex Wound Measurement - multiple wounds 3 5 INTERVENTIONS - Wound Dressings X - Small Wound Dressing one or multiple wounds 3 10 _1  - Medium Wound Dressing one or multiple wounds 0 _2  - Large Wound Dressing one or multiple wounds 0 <PXTGGYIRSWNIOEVO>_3<\/JKKXFGHWEXHBZJIR>_6  - Application of Medications - topical 0 <VELFYBOFBPZWCHEN>_2<\/DPOEUMPNTIRWERXV>_4  - Application of Medications - injection 0 INTERVENTIONS - Miscellaneous _5  - External ear exam 0 [name], [name] ([id number]) _6  - Specimen Collection (cultures, biopsies, blood, body fluids, etc.) 0 _7  - Specimen(s) / Culture(s) sent or taken to Lab for analysis 0 _8  - Patient Transfer (multiple staff / Harrel Lemon Lift / Similar devices) 0 _9  - Simple Staple / Suture removal (25 or less) 0 _10  - Complex Staple / Suture removal (26 or more) 0 _11  - Hypo / Hyperglycemic Management (close monitor of Blood Glucose) 0 _12  - Ankle / Brachial Index (ABI) - do not check if billed separately 0 X - Vital Signs 1 5 Has the patient been seen at the hospital within the last three years: Yes Total Score: 130 Level Of Care: New/Established - Level 4 Electronic Signature(s) Signed: 03/18/2015 5:40:21 PM By: Montey Hora Entered By: Montey Hora on 03/18/2015 08:43:57 [name] ([id number]) -------------------------------------------------------------------------------- Encounter Discharge Information Details Patient Name: [name] Date of Service: 03/18/2015 8:00 AM Medical Record Number: [id number] Patient Account Number: [id number] Date of Birth/Sex: Feb 27, 1965 (49 y.o. Male) Treating RN: Montey Hora Primary Care Physician: [name], [place] Other Clinician: Referring Physician: [name], MELINDA Treating Physician/Extender: Frann Rider in Treatment: 10 Encounter  Discharge Information Items Schedule Follow-up Appointment: No Medication Reconciliation completed No and provided to Patient/Care [name]: Provided on Clinical Summary of Care: 03/18/2015 Form Type Recipient Paper Patient RT Electronic Signature(s) Signed: 03/18/2015 8:57:29 AM By: Ruthine Dose Entered By: Ruthine Dose on 03/18/2015 08:57:29 [name] ([id number]) -------------------------------------------------------------------------------- Lower Extremity Assessment Details Patient Name: [name] Date of Service: 03/18/2015 8:00 AM Medical Record Number: [id number] Patient Account Number: [id number] Date of Birth/Sex: 09-18-1965 (49 y.o. Male) Treating RN: Montey Hora Primary Care Physician: [name], MELINDA Other Clinician: Referring Physician: [name], MELINDA Treating Physician/Extender: Frann Rider in Treatment: 10 Edema Assessment Assessed: [Left: No] [Right: No] Edema: [Left: Ye] [Right: s] Calf Left: Right: Point of Measurement: 34 cm From Medial Instep cm 49 cm Ankle Left: Right: Point of Measurement: 9 cm From Medial Instep cm 31 cm Vascular Assessment Pulses: Posterior Tibial Dorsalis Pedis Palpable: [Right:Yes] Extremity colors, hair growth, and conditions: Extremity Color: [Right:Normal] Hair Growth on Extremity: [Right:No] Temperature of Extremity: [Right:Warm] Capillary Refill: [Right:< 3 seconds] Electronic Signature(s) Signed: 03/18/2015 5:40:21 PM By: Montey Hora Entered By: Montey Hora on 03/18/2015 08:41:09 [name] ([id number]) -------------------------------------------------------------------------------- Multi Wound Chart Details Patient Name: [name] Date of Service: 03/18/2015 8:00 AM Medical Record Number: [id number] Patient Account Number: [id number] Date of Birth/Sex: Mar 16, 1965 (50 y.o. Male) Treating RN: Montey Hora Primary Care Physician: [name], MELINDA Other  Clinician: Referring Physician: [name], MELINDA Treating Physician/Extender: Frann Rider in Treatment: 10 Vital Signs Height(in): 71 Pulse(bpm): 71 Weight(lbs): 360 Blood Pressure 142/97 (mmHg): Body Mass Index(BMI): 50 Temperature(F): 98.5 Respiratory Rate 20 (breaths/min): Photos: [2:No Photos] [3:No Photos] [4:No Photos] Wound Location: [2:Right Lower Leg - Lateral, Right Lower Leg - Medial Right Lower Leg - Distal Superior] Wounding Event: [2:Gradually Appeared] [3:Gradually Appeared] [4:Gradually Appeared] Primary Etiology: [2:Venous Leg Ulcer] [3:Venous Leg Ulcer] [4:Venous Leg Ulcer] Comorbid History: [2:Lymphedema, Congestive Lymphedema, Congestive  Lymphedema, Congestive Heart Failure, Hypertension, Type II Diabetes, Gout] [3:Heart Failure, Hypertension, Type II Diabetes, Gout] [4:Heart Failure, Hypertension, Type II Diabetes, Gout] Date Acquired: [2:12/13/2014] [3:12/13/2014] [4:12/13/2014] Weeks of Treatment: [2:10] [3:10] [4:10] Wound Status: [2:Open] [3:Open] [4:Open] Measurements L x W x D 0.4x0.4x1.4 [3:0.5x0.4x0.5] [4:0.3x0.4x0.4] (cm) Area (cm) : [2:0.126] [3:0.157] [4:0.094] Volume (cm) : [2:0.176] [3:0.079] [4:0.038] % Reduction in Area: [2:83.90%] [3:91.70%] [4:93.90%] % Reduction in Volume: -12.10% [3:79.00%] [4:87.60%] Classification: [2:Full Thickness Without Exposed Support Structures] [3:Full Thickness Without Exposed Support Structures] [4:Full Thickness Without Exposed Support Structures] HBO Classification: [2:Grade 1] [3:Grade 1] [4:Grade 1] Exudate Amount: [2:Large] [3:Large] [4:Large] Exudate Type: [2:Purulent] [3:Serous] [4:Serous] Exudate Color: [2:yellow, brown, green] [3:amber] [4:amber] Wound Margin: [2:Flat and Intact] [3:Flat and Intact] [4:Flat and Intact] Granulation Amount: [2:Large (67-100%)] [3:Medium (34-66%)] [4:Medium (34-66%)] Granulation Quality: [2:Pink] [3:Red, Pink] [4:Red, Pink] Necrotic Amount: [2:Small (1-33%)]  [3:Medium (34-66%)] [4:Medium (34-66%)] Exposed Structures: [name], [name] ([id number]) Fascia: No Fascia: No Fascia: No Fat: No Fat: No Fat: No Tendon: No Tendon: No Tendon: No Muscle: No Muscle: No Muscle: No Joint: No Joint: No Joint: No Bone: No Bone: No Bone: No Limited to Skin Limited to Skin Limited to Skin Breakdown Breakdown Breakdown Epithelialization: None None None Periwound Skin Texture: Edema: Yes Edema: Yes Edema: Yes Excoriation: No Excoriation: No Induration: No Induration: No Callus: No Callus: No Crepitus: No Crepitus: No Fluctuance: No Fluctuance: No Friable: No Friable: No Rash: No Rash: No Scarring: No Scarring: No Periwound Skin Moist: Yes Moist: Yes Moist: Yes Moisture: Maceration: No Maceration: No Dry/Scaly: No Dry/Scaly: No Periwound Skin Color: No Abnormalities Noted Atrophie Blanche: No Atrophie Blanche: No Cyanosis: No Cyanosis: No Ecchymosis: No Ecchymosis: No Erythema: No Erythema: No Hemosiderin Staining: No Hemosiderin Staining: No Mottled: No Mottled: No Pallor: No Pallor: No Rubor: No Rubor: No Temperature: No Abnormality N/A No Abnormality Tenderness on Yes Yes Yes Palpation: Wound Preparation: Ulcer Cleansing: Ulcer Cleansing: Ulcer Cleansing: Rinsed/Irrigated with Rinsed/Irrigated with Rinsed/Irrigated with Saline Saline Saline Topical Anesthetic Topical Anesthetic Topical Anesthetic Applied: Other: lidocaine Applied: Other: lidocaine Applied: Other: lidocaine 4% 4% 4% Treatment Notes Electronic Signature(s) Signed: 03/18/2015 5:40:21 PM By: Montey Hora Entered By: Montey Hora on 03/18/2015 08:41:58 [name] ([id number]) -------------------------------------------------------------------------------- [place] Details Patient Name: [name] Date of Service: 03/18/2015 8:00 AM Medical Record Number: [id number] Patient Account Number: [id number] Date  of Birth/Sex: [birth year] (49 y.o. Male) Treating RN: Montey Hora Primary Care Physician: [name], [place] Other Clinician: Referring Physician: [name], MELINDA Treating Physician/Extender: Frann Rider in Treatment: 10 Active Inactive Abuse / Safety / Falls / Self Care Management Nursing Diagnoses: Potential for falls Goals: Patient will remain injury free Date Initiated: 01/01/2015 Goal Status: Active Patient/caregiver will verbalize/demonstrate measures taken to prevent injury and/or falls Date Initiated: 01/01/2015 Goal Status: Active Interventions: Assess fall risk on admission and as needed Notes: Nutrition Nursing Diagnoses: Imbalanced nutrition Goals: Patient/caregiver agrees to and verbalizes understanding of need to use nutritional supplements and/or vitamins as prescribed Date Initiated: 01/01/2015 Goal Status: Active Patient/caregiver verbalizes understanding of need to maintain therapeutic glucose control per primary care physician Date Initiated: 01/01/2015 Goal Status: Active Patient/caregiver will maintain therapeutic glucose control Date Initiated: 01/01/2015 Goal Status: Active Interventions: [name], [name] ([id number]) Provide education on elevated blood sugars and impact on wound healing Provide education on nutrition Treatment Activities: Education provided on Nutrition : 01/08/2015 Notes: Orientation to the Wound Care Program Nursing Diagnoses: Knowledge deficit related to the wound healing center program Goals: Patient/caregiver will verbalize  understanding of the [place] Program Date Initiated: 01/01/2015 Goal Status: Active Interventions: Provide education on orientation to the wound center Notes: Pain, Acute or Chronic Nursing Diagnoses: Pain, acute or chronic: actual or potential Goals: Patient will verbalize adequate pain control and receive pain control interventions during procedures as needed Date Initiated:  01/01/2015 Goal Status: Active Patient/caregiver will verbalize adequate pain control between visits Date Initiated: 01/01/2015 Goal Status: Active Patient/caregiver will verbalize comfort level met Date Initiated: 01/01/2015 Goal Status: Active Interventions: Assess comfort goal upon admission Complete pain assessment as per visit requirements Notes: Wound/Skin Impairment [name], [name] ([id number]) Nursing Diagnoses: Impaired tissue integrity Knowledge deficit related to smoking impact on wound healing Knowledge deficit related to ulceration/compromised skin integrity Goals: Patient will demonstrate a reduced rate of smoking or cessation of smoking Date Initiated: 01/01/2015 Goal Status: Active Patient/caregiver will verbalize understanding of skin care regimen Date Initiated: 01/01/2015 Goal Status: Active Ulcer/skin breakdown will have a volume reduction of 30% by week 4 Date Initiated: 01/01/2015 Goal Status: Active Ulcer/skin breakdown will have a volume reduction of 50% by week 8 Date Initiated: 01/01/2015 Goal Status: Active Ulcer/skin breakdown will have a volume reduction of 80% by week 12 Date Initiated: 01/01/2015 Goal Status: Active Interventions: Assess patient/caregiver ability to obtain necessary supplies Assess patient/caregiver ability to perform ulcer/skin care regimen upon admission and as needed Notes: Electronic Signature(s) Signed: 03/18/2015 5:40:21 PM By: Montey Hora Entered By: Montey Hora on 03/18/2015 08:41:49 [name] ([id number]) -------------------------------------------------------------------------------- Patient/Caregiver Education Details Patient Name: [name] Date of Service: 03/18/2015 8:00 AM Medical Record Number: [id number] Patient Account Number: [id number] Date of Birth/Gender: 03-22-1965 (50 y.o. Male) Treating RN: Montey Hora Primary Care Physician: [name], [place] Other Clinician: Referring Physician:  [name], MELINDA Treating Physician/Extender: Frann Rider in Treatment: 10 Education Assessment Education Provided To: Patient Education Topics Provided Venous: Handouts: Other: continue with compression and elevation Methods: Explain/Verbal Responses: State content correctly Electronic Signature(s) Signed: 03/18/2015 5:40:21 PM By: Montey Hora Entered By: Montey Hora on 03/18/2015 08:57:52 [name] ([id number]) -------------------------------------------------------------------------------- Wound Assessment Details Patient Name: [name] Date of Service: 03/18/2015 8:00 AM Medical Record Number: [id number] Patient Account Number: [id number] Date of Birth/Sex: 03/18/1965 (50 y.o. Male) Treating RN: Montey Hora Primary Care Physician: [name], [place] Other Clinician: Referring Physician: [name], MELINDA Treating Physician/Extender: Frann Rider in Treatment: 10 Wound Status Wound Number: 2 Primary Venous Leg Ulcer Etiology: Wound Location: Right Lower Leg - Lateral, Superior Wound Open Status: Wounding Event: Gradually Appeared Comorbid Lymphedema, Congestive Heart Date Acquired: 12/13/2014 History: Failure, Hypertension, Type II Weeks Of Treatment: 10 Diabetes, Gout Clustered Wound: No Photos Photo Uploaded By: Montey Hora on 03/18/2015 12:44:58 Wound Measurements Length: (cm) 0.4 Width: (cm) 0.4 Depth: (cm) 1.4 Area: (cm) 0.126 Volume: (cm) 0.176 % Reduction in Area: 83.9% % Reduction in Volume: -12.1% Epithelialization: None Tunneling: No Undermining: No Wound Description Full Thickness Without Classification: Exposed Support Structures Diabetic Severity Grade 1 (Wagner): Wound Margin: Flat and Intact Exudate Amount: Large Exudate Type: Purulent Exudate Color: yellow, brown, green Foul Odor After Cleansing: No Wound Bed Granulation Amount: Large (67-100%) Exposed Structure [name], [place].  ([id number]) Granulation Quality: Pink Fascia Exposed: No Necrotic Amount: Small (1-33%) Fat Layer Exposed: No Necrotic Quality: Adherent Slough Tendon Exposed: No Muscle Exposed: No Joint Exposed: No Bone Exposed: No Limited to Skin Breakdown Periwound Skin Texture Texture Color No Abnormalities Noted: No No Abnormalities Noted: No Localized Edema: Yes Temperature / Pain Moisture Temperature: No Abnormality No Abnormalities  Noted: No Tenderness on Palpation: Yes Moist: Yes Wound Preparation Ulcer Cleansing: Rinsed/Irrigated with Saline Topical Anesthetic Applied: Other: lidocaine 4%, Treatment Notes Wound #2 (Right, Lateral, Superior Lower Leg) 1. Cleansed with: Clean wound with Normal Saline 2. Anesthetic Topical Lidocaine 4% cream to wound bed prior to debridement 4. Dressing Applied: Aquacel Ag 5. Secondary Dressing Applied Dry [place] Electronic Signature(s) Signed: 03/18/2015 5:40:21 PM By: Montey Hora Entered By: Montey Hora on 03/18/2015 08:29:55 [name] ([id number]) -------------------------------------------------------------------------------- Wound Assessment Details Patient Name: [name] Date of Service: 03/18/2015 8:00 AM Medical Record Number: [id number] Patient Account Number: [id number] Date of Birth/Sex: Oct 31, 1965 (49 y.o. Male) Treating RN: Montey Hora Primary Care Physician: [name], [place] Other Clinician: Referring Physician: [name], [place] Treating Physician/Extender: Frann Rider in Treatment: 10 Wound Status Wound Number: 3 Primary Venous Leg Ulcer Etiology: Wound Location: Right Lower Leg - Medial Wound Open Wounding Event: Gradually Appeared Status: Date Acquired: 12/13/2014 Comorbid Lymphedema, Congestive Heart Weeks Of Treatment: 10 History: Failure, Hypertension, Type II Clustered Wound: No Diabetes, Gout Photos Photo Uploaded By: Montey Hora on 03/18/2015 12:44:59 Wound  Measurements Length: (cm) 0.5 Width: (cm) 0.4 Depth: (cm) 0.5 Area: (cm) 0.157 Volume: (cm) 0.079 % Reduction in Area: 91.7% % Reduction in Volume: 79% Epithelialization: None Tunneling: No Undermining: No Wound Description Full Thickness Without Classification: Exposed Support Structures Diabetic Severity Grade 1 (Wagner): Wound Margin: Flat and Intact Exudate Amount: Large Exudate Type: Serous Exudate Color: amber Foul Odor After Cleansing: No Wound Bed Granulation Amount: Medium (34-66%) Exposed Structure [name], [place]. ([id number]) Granulation Quality: Red, Pink Fascia Exposed: No Necrotic Amount: Medium (34-66%) Fat Layer Exposed: No Necrotic Quality: Adherent Slough Tendon Exposed: No Muscle Exposed: No Joint Exposed: No Bone Exposed: No Limited to Skin Breakdown Periwound Skin Texture Texture Color No Abnormalities Noted: No No Abnormalities Noted: No Callus: No Atrophie Blanche: No Crepitus: No Cyanosis: No Excoriation: No Ecchymosis: No Fluctuance: No Erythema: No Friable: No Hemosiderin Staining: No Induration: No Mottled: No Localized Edema: Yes Pallor: No Rash: No Rubor: No Scarring: No Temperature / Pain Moisture Tenderness on Palpation: Yes No Abnormalities Noted: No Dry / Scaly: No Maceration: No Moist: Yes Wound Preparation Ulcer Cleansing: Rinsed/Irrigated with Saline Topical Anesthetic Applied: Other: lidocaine 4%, Treatment Notes Wound #3 (Right, Medial Lower Leg) 1. Cleansed with: Clean wound with Normal Saline 2. Anesthetic Topical Lidocaine 4% cream to wound bed prior to debridement 4. Dressing Applied: Aquacel Ag 5. Secondary Dressing Applied Dry [place] Electronic Signature(s) Signed: 03/18/2015 5:40:21 PM By: Montey Hora Entered By: Montey Hora on 03/18/2015 08:30:06 [name] ([id number]) [name], [name]  ([id number]) -------------------------------------------------------------------------------- Wound Assessment Details Patient Name: [name] Date of Service: 03/18/2015 8:00 AM Medical Record Number: [id number] Patient Account Number: [id number] Date of Birth/Sex: [birth year] (49 y.o. Male) Treating RN: Montey Hora Primary Care Physician: [name], [place] Other Clinician: Referring Physician: [name], MELINDA Treating Physician/Extender: Frann Rider in Treatment: 10 Wound Status Wound Number: 4 Primary Venous Leg Ulcer Etiology: Wound Location: Right Lower Leg - Distal Wound Open Wounding Event: Gradually Appeared Status: Date Acquired: 12/13/2014 Comorbid Lymphedema, Congestive Heart Weeks Of Treatment: 10 History: Failure, Hypertension, Type II Clustered Wound: No Diabetes, Gout Photos Photo Uploaded By: Montey Hora on 03/18/2015 12:45:14 Wound Measurements Length: (cm) 0.3 Width: (cm) 0.4 Depth: (cm) 0.4 Area: (cm) 0.094 Volume: (cm) 0.038 % Reduction in Area: 93.9% % Reduction in Volume: 87.6% Epithelialization: None Tunneling: No Undermining: No Wound Description Full Thickness Without Classification: Exposed  Support Structures Diabetic Severity Grade 1 (Wagner): Wound Margin: Flat and Intact Exudate Amount: Large Exudate Type: Serous Exudate Color: amber Foul Odor After Cleansing: No Wound Bed Granulation Amount: Medium (34-66%) Exposed Structure [name], [place]. ([id number]) Granulation Quality: Red, Pink Fascia Exposed: No Necrotic Amount: Medium (34-66%) Fat Layer Exposed: No Necrotic Quality: Adherent Slough Tendon Exposed: No Muscle Exposed: No Joint Exposed: No Bone Exposed: No Limited to Skin Breakdown Periwound Skin Texture Texture Color No Abnormalities Noted: No No Abnormalities Noted: No Callus: No Atrophie Blanche: No Crepitus: No Cyanosis: No Excoriation: No Ecchymosis: No Fluctuance: No Erythema:  No Friable: No Hemosiderin Staining: No Induration: No Mottled: No Localized Edema: Yes Pallor: No Rash: No Rubor: No Scarring: No Temperature / Pain Moisture Temperature: No Abnormality No Abnormalities Noted: No Tenderness on Palpation: Yes Dry / Scaly: No Maceration: No Moist: Yes Wound Preparation Ulcer Cleansing: Rinsed/Irrigated with Saline Topical Anesthetic Applied: Other: lidocaine 4%, Treatment Notes Wound #4 (Right, Distal Lower Leg) 1. Cleansed with: Clean wound with Normal Saline 2. Anesthetic Topical Lidocaine 4% cream to wound bed prior to debridement 4. Dressing Applied: Aquacel Ag 5. Secondary Dressing Applied Dry [place] Signature(s) Signed: 03/18/2015 5:40:21 PM By: Montey Hora Entered By: Montey Hora on 03/18/2015 08:30:20 [name] ([id number]) [name] ([id number]) -------------------------------------------------------------------------------- [place] Details Patient Name: [name] Date of Service: 03/18/2015 8:00 AM Medical Record Number: [id number] Patient Account Number: [id number] Date of Birth/Sex: Apr 07, 1965 (49 y.o. Male) Treating RN: Montey Hora Primary Care Physician: [name], [place] Other Clinician: Referring Physician: [name], [place] Treating Physician/Extender: Frann Rider in Treatment: 10 Vital Signs Time Taken: 08:20 Temperature (F): 98.5 Height (in): 71 Pulse (bpm): 71 Weight (lbs): 360 Respiratory Rate (breaths/min): 20 Body Mass Index (BMI): 50.2 Blood Pressure (mmHg): 142/97 Reference Range: 80 - 120 mg / dl Electronic Signature(s) Signed: 03/18/2015 5:40:21 PM By: Montey Hora Entered By: Montey Hora on 03/18/2015 08:21:53

## 2015-03-21 ENCOUNTER — Ambulatory Visit: Payer: Managed Care, Other (non HMO) | Admitting: Family Medicine

## 2015-03-25 ENCOUNTER — Encounter: Payer: Managed Care, Other (non HMO) | Admitting: Surgery

## 2015-03-25 DIAGNOSIS — E11622 Type 2 diabetes mellitus with other skin ulcer: Secondary | ICD-10-CM | POA: Diagnosis not present

## 2015-03-26 NOTE — Progress Notes (Signed)
Darren, Fitzgerald ([id number]) Visit Report for 03/25/2015 Arrival Information Details Patient Name: [name], [name]. Date of Service: 03/25/2015 8:00 AM Medical Record Number: [id number] Patient Account Number: [id number] Date of Birth/Sex: 1965-10-29 (50 y.o. Male) Treating RN: Ahmed Prima Primary Care Physician: [name], [place] Other Clinician: Referring Physician: [name], [place] Treating Physician/Extender: Frann Rider in Treatment: 11 Visit Information History Since Last Visit All ordered tests and consults were completed: No Patient Arrived: Ambulatory Added or deleted any medications: No Arrival Time: 08:30 Any new allergies or adverse reactions: No Accompanied By: self Had a fall or experienced change in No Transfer Assistance: None activities of daily living that may affect Patient Requires Transmission-Based No risk of falls: Precautions: Signs or symptoms of abuse/neglect since last No Patient Has Alerts: Yes visito Patient Alerts: DM II Hospitalized since last visit: No Pain Present Now: No Electronic Signature(s) Signed: 03/25/2015 4:52:05 PM By: Alric Quan Entered By: Alric Quan on 03/25/2015 08:30:26 [name] ([id number]) -------------------------------------------------------------------------------- Encounter Discharge Information Details Patient Name: [name] Date of Service: 03/25/2015 8:00 AM Medical Record Number: [id number] Patient Account Number: [id number] Date of Birth/Sex: Jul 21, 1965 (49 y.o. Male) Treating RN: Ahmed Prima Primary Care Physician: [name], [place] Other Clinician: Referring Physician: [name], [place] Treating Physician/Extender: Frann Rider in Treatment: 11 Encounter Discharge Information Items Discharge Pain Level: 0 Discharge Condition: Stable Ambulatory Status: Ambulatory Discharge Destination: Home Transportation: Other Accompanied By: self Schedule Follow-up Appointment:  Yes Medication Reconciliation completed and provided to Patient/Care Yes Seyon Strader: Provided on Clinical Summary of Care: 03/25/2015 Form Type Recipient Paper Patient RT Electronic Signature(s) Signed: 03/25/2015 4:52:05 PM By: Alric Quan Previous Signature: 03/25/2015 8:54:21 AM Version By: Ruthine Dose Entered By: Alric Quan on 03/25/2015 09:09:16 [name] ([id number]) -------------------------------------------------------------------------------- Lower Extremity Assessment Details Patient Name: [name] Date of Service: 03/25/2015 8:00 AM Medical Record Number: [id number] Patient Account Number: [id number] Date of Birth/Sex: 1965/08/27 (49 y.o. Male) Treating RN: Ahmed Prima Primary Care Physician: [name], [place] Other Clinician: Referring Physician: [name], MELINDA Treating Physician/Extender: Frann Rider in Treatment: 11 Edema Assessment Assessed: [Left: No] [Right: No] E[Left: dema] [Right: :] Calf Left: Right: Point of Measurement: cm From Medial Instep cm 48 cm Ankle Left: Right: Point of Measurement: cm From Medial Instep cm 31 cm Vascular Assessment Pulses: Posterior Tibial Dorsalis Pedis Palpable: [Right:Yes] Extremity colors, hair growth, and conditions: Extremity Color: [Right:Hyperpigmented] Temperature of Extremity: [Right:Warm] Capillary Refill: [Right:< 3 seconds] Electronic Signature(s) Signed: 03/25/2015 4:52:05 PM By: Alric Quan Entered By: Alric Quan on 03/25/2015 08:33:06 [name] ([id number]) -------------------------------------------------------------------------------- Multi Wound Chart Details Patient Name: [name] Date of Service: 03/25/2015 8:00 AM Medical Record Number: [id number] Patient Account Number: [id number] Date of Birth/Sex: 17-Mar-1965 (49 y.o. Male) Treating RN: Ahmed Prima Primary Care Physician: [name], [place] Other Clinician: Referring Physician:  [name], MELINDA Treating Physician/Extender: Frann Rider in Treatment: 11 Vital Signs Height(in): 71 Pulse(bpm): 75 Weight(lbs): 360 Blood Pressure 150/99 (mmHg): Body Mass Index(BMI): 50 Temperature(F): 97.6 Respiratory Rate 20 (breaths/min): Photos: [2:No Photos] [3:No Photos] [4:No Photos] Wound Location: [2:Right Lower Leg - Lateral, Right Lower Leg - Medial Right Lower Leg - Distal Superior] Wounding Event: [2:Gradually Appeared] [3:Gradually Appeared] [4:Gradually Appeared] Primary Etiology: [2:Venous Leg Ulcer] [3:Venous Leg Ulcer] [4:Venous Leg Ulcer] Comorbid History: [2:Lymphedema, Congestive Lymphedema, Congestive Lymphedema, Congestive Heart Failure, Hypertension, Type II Diabetes, Gout] [3:Heart Failure, Hypertension, Type II Diabetes, Gout] [4:Heart Failure, Hypertension, Type II Diabetes, Gout] Date Acquired: [2:12/13/2014] [3:12/13/2014] [4:12/13/2014] Weeks of Treatment: [2:11] [3:11] [4:11] Wound  Status: [2:Open] [3:Open] [4:Open] Measurements L x W x D 0.4x0.4x1.4 [3:0.5x0.3x0.4] [4:0.2x0.2x0.2] (cm) Area (cm) : [2:0.126] [3:0.118] [4:0.031] Volume (cm) : [2:0.176] [3:0.047] [4:0.006] % Reduction in Area: [2:83.90%] [3:93.70%] [4:98.00%] % Reduction in Volume: -12.10% [3:87.50%] [4:98.00%] Starting Position 1 [3:1] [4:1] (o'clock): Ending Position 1 (o'clock): Maximum Distance 1 [3:0.2] [4:0.2] (cm): Undermining: [2:No] [3:Yes] [4:Yes] Classification: [2:Full Thickness Without Exposed Support Structures] [3:Full Thickness Without Exposed Support Structures] [4:Full Thickness Without Exposed Support Structures] HBO Classification: [2:Grade 1] [3:Grade 1] [4:Grade 1] Exudate Amount: [2:Large] [3:Large] [4:Large] Exudate Type: Purulent Serous Serous Exudate Color: yellow, brown, green amber amber Wound Margin: Flat and Intact Flat and Intact Flat and Intact Granulation Amount: Large (67-100%) Medium (34-66%) Medium (34-66%) Granulation Quality:  Pink Red, Pink Red, Pink Necrotic Amount: Small (1-33%) Medium (34-66%) Medium (34-66%) Exposed Structures: Fascia: No Fascia: No Fascia: No Fat: No Fat: No Fat: No Tendon: No Tendon: No Tendon: No Muscle: No Muscle: No Muscle: No Joint: No Joint: No Joint: No Bone: No Bone: No Bone: No Limited to Skin Limited to Skin Limited to Skin Breakdown Breakdown Breakdown Epithelialization: None None None Periwound Skin Texture: Edema: Yes Edema: Yes Edema: Yes Excoriation: No Excoriation: No Induration: No Induration: No Callus: No Callus: No Crepitus: No Crepitus: No Fluctuance: No Fluctuance: No Friable: No Friable: No Rash: No Rash: No Scarring: No Scarring: No Periwound Skin Moist: Yes Moist: Yes Moist: Yes Moisture: Maceration: No Maceration: No Dry/Scaly: No Dry/Scaly: No Periwound Skin Color: No Abnormalities Noted Atrophie Blanche: No Atrophie Blanche: No Cyanosis: No Cyanosis: No Ecchymosis: No Ecchymosis: No Erythema: No Erythema: No Hemosiderin Staining: No Hemosiderin Staining: No Mottled: No Mottled: No Pallor: No Pallor: No Rubor: No Rubor: No Temperature: No Abnormality N/A No Abnormality Tenderness on Yes Yes Yes Palpation: Wound Preparation: Ulcer Cleansing: Ulcer Cleansing: Ulcer Cleansing: Rinsed/Irrigated with Rinsed/Irrigated with Rinsed/Irrigated with Saline Saline Saline Topical Anesthetic Topical Anesthetic Topical Anesthetic Applied: Other: lidocaine Applied: Other: lidocaine Applied: Other: lidocaine 4% 4% 4% Treatment Notes Electronic Signature(s) GEOFREY, SILLIMAN ([id number]) Signed: 03/25/2015 4:52:05 PM By: Alric Quan Entered By: Alric Quan on 03/25/2015 08:41:18 [name] ([id number]) -------------------------------------------------------------------------------- Multi-Disciplinary Care Plan Details Patient Name: [name] Date of Service: 03/25/2015 8:00 AM Medical Record  Number: [id number] Patient Account Number: [id number] Date of Birth/Sex: 1965/08/02 (49 y.o. Male) Treating RN: Ahmed Prima Primary Care Physician: [name], [place] Other Clinician: Referring Physician: [name], [place] Treating Physician/Extender: Frann Rider in Treatment: 37 Active Inactive Abuse / Safety / Falls / Self Care Management Nursing Diagnoses: Potential for falls Goals: Patient will remain injury free Date Initiated: 01/01/2015 Goal Status: Active Patient/caregiver will verbalize/demonstrate measures taken to prevent injury and/or falls Date Initiated: 01/01/2015 Goal Status: Active Interventions: Assess fall risk on admission and as needed Notes: Nutrition Nursing Diagnoses: Imbalanced nutrition Goals: Patient/caregiver agrees to and verbalizes understanding of need to use nutritional supplements and/or vitamins as prescribed Date Initiated: 01/01/2015 Goal Status: Active Patient/caregiver verbalizes understanding of need to maintain therapeutic glucose control per primary care physician Date Initiated: 01/01/2015 Goal Status: Active Patient/caregiver will maintain therapeutic glucose control Date Initiated: 01/01/2015 Goal Status: Active Interventions: HALFORD, GOETZKE ([id number]) Provide education on elevated blood sugars and impact on wound healing Provide education on nutrition Treatment Activities: Education provided on Nutrition : 01/08/2015 Notes: Orientation to the Wound Care Program Nursing Diagnoses: Knowledge deficit related to the wound healing center program Goals: Patient/caregiver will verbalize understanding of the [place] Program Date Initiated: 01/01/2015 Goal Status: Active Interventions: Provide  education on orientation to the wound center Notes: Pain, Acute or Chronic Nursing Diagnoses: Pain, acute or chronic: actual or potential Goals: Patient will verbalize adequate pain control and receive pain control  interventions during procedures as needed Date Initiated: 01/01/2015 Goal Status: Active Patient/caregiver will verbalize adequate pain control between visits Date Initiated: 01/01/2015 Goal Status: Active Patient/caregiver will verbalize comfort level met Date Initiated: 01/01/2015 Goal Status: Active Interventions: Assess comfort goal upon admission Complete pain assessment as per visit requirements Notes: Wound/Skin Impairment KAILAN, LAWS ([id number]) Nursing Diagnoses: Impaired tissue integrity Knowledge deficit related to smoking impact on wound healing Knowledge deficit related to ulceration/compromised skin integrity Goals: Patient will demonstrate a reduced rate of smoking or cessation of smoking Date Initiated: 01/01/2015 Goal Status: Active Patient/caregiver will verbalize understanding of skin care regimen Date Initiated: 01/01/2015 Goal Status: Active Ulcer/skin breakdown will have a volume reduction of 30% by week 4 Date Initiated: 01/01/2015 Goal Status: Active Ulcer/skin breakdown will have a volume reduction of 50% by week 8 Date Initiated: 01/01/2015 Goal Status: Active Ulcer/skin breakdown will have a volume reduction of 80% by week 12 Date Initiated: 01/01/2015 Goal Status: Active Interventions: Assess patient/caregiver ability to obtain necessary supplies Assess patient/caregiver ability to perform ulcer/skin care regimen upon admission and as needed Notes: Electronic Signature(s) Signed: 03/25/2015 4:52:05 PM By: Alric Quan Entered By: Alric Quan on 03/25/2015 08:41:07 [name] ([id number]) -------------------------------------------------------------------------------- Pain Assessment Details Patient Name: [name] Date of Service: 03/25/2015 8:00 AM Medical Record Number: [id number] Patient Account Number: [id number] Date of Birth/Sex: [birth year] (49 y.o. Male) Treating RN: Ahmed Prima Primary Care Physician:  [name], [place] Other Clinician: Referring Physician: [name], [place] Treating Physician/Extender: Frann Rider in Treatment: 11 Active Problems Location of Pain Severity and Description of Pain Patient Has Paino No Site Locations Pain Management and Medication Current Pain Management: Electronic Signature(s) Signed: 03/25/2015 4:52:05 PM By: Alric Quan Entered By: Alric Quan on 03/25/2015 08:30:31 [name] ([id number]) -------------------------------------------------------------------------------- Patient/Caregiver Education Details Patient Name: [name] Date of Service: 03/25/2015 8:00 AM Medical Record Number: [id number] Patient Account Number: [id number] Date of Birth/Gender: 05/31/1965 (49 y.o. Male) Treating RN: Ahmed Prima Primary Care Physician: [name], [place] Other Clinician: Referring Physician: [name], [place] Treating Physician/Extender: Frann Rider in Treatment: 11 Education Assessment Education Provided To: Patient Education Topics Provided Wound/Skin Impairment: Handouts: Other: change dressing as ordered Methods: Demonstration, Explain/Verbal Responses: State content correctly Electronic Signature(s) Signed: 03/25/2015 4:52:05 PM By: Alric Quan Entered By: Alric Quan on 03/25/2015 09:09:29 [name] ([id number]) -------------------------------------------------------------------------------- Wound Assessment Details Patient Name: [name] Date of Service: 03/25/2015 8:00 AM Medical Record Number: [id number] Patient Account Number: [id number] Date of Birth/Sex: December 23, 1965 (50 y.o. Male) Treating RN: Ahmed Prima Primary Care Physician: [name], [place] Other Clinician: Referring Physician: [name], MELINDA Treating Physician/Extender: Frann Rider in Treatment: 11 Wound Status Wound Number: 2 Primary Venous Leg Ulcer Etiology: Wound Location: Right Lower Leg -  Lateral, Superior Wound Open Status: Wounding Event: Gradually Appeared Comorbid Lymphedema, Congestive Heart Date Acquired: 12/13/2014 History: Failure, Hypertension, Type II Weeks Of Treatment: 11 Diabetes, Gout Clustered Wound: No Photos Photo Uploaded By: Alric Quan on 03/25/2015 12:23:03 Wound Measurements Length: (cm) 0.4 Width: (cm) 0.4 Depth: (cm) 1.4 Area: (cm) 0.126 Volume: (cm) 0.176 % Reduction in Area: 83.9% % Reduction in Volume: -12.1% Epithelialization: None Tunneling: No Undermining: No Wound Description Full Thickness Without Classification: Exposed Support Structures Diabetic Severity Grade 1 (Wagner): Wound Margin: Flat and Intact Exudate Amount: Large Exudate Type:  Purulent Exudate Color: yellow, brown, green Foul Odor After Cleansing: No Wound Bed Granulation Amount: Large (67-100%) Exposed Structure KRISTAPHER, [place] ([id number]) Granulation Quality: Pink Fascia Exposed: No Necrotic Amount: Small (1-33%) Fat Layer Exposed: No Necrotic Quality: Adherent Slough Tendon Exposed: No Muscle Exposed: No Joint Exposed: No Bone Exposed: No Limited to Skin Breakdown Periwound Skin Texture Texture Color No Abnormalities Noted: No No Abnormalities Noted: No Localized Edema: Yes Temperature / Pain Moisture Temperature: No Abnormality No Abnormalities Noted: No Tenderness on Palpation: Yes Moist: Yes Wound Preparation Ulcer Cleansing: Rinsed/Irrigated with Saline Topical Anesthetic Applied: Other: lidocaine 4%, Treatment Notes Wound #2 (Right, Lateral, Superior Lower Leg) 1. Cleansed with: Clean wound with Normal Saline 2. Anesthetic Topical Lidocaine 4% cream to wound bed prior to debridement 4. Dressing Applied: Aquacel Ag 5. Secondary [place] Signature(s) Signed: 03/25/2015 4:52:05 PM By: Alric Quan Entered By: Alric Quan on 03/25/2015 08:39:46 [name]  ([id number]) -------------------------------------------------------------------------------- Wound Assessment Details Patient Name: [name] Date of Service: 03/25/2015 8:00 AM Medical Record Number: [id number] Patient Account Number: [id number] Date of Birth/Sex: [birth year] (49 y.o. Male) Treating RN: Ahmed Prima Primary Care Physician: [name], [place] Other Clinician: Referring Physician: [name], [place] Treating Physician/Extender: Frann Rider in Treatment: 11 Wound Status Wound Number: 3 Primary Venous Leg Ulcer Etiology: Wound Location: Right Lower Leg - Medial Wound Open Wounding Event: Gradually Appeared Status: Date Acquired: 12/13/2014 Comorbid Lymphedema, Congestive Heart Weeks Of Treatment: 11 History: Failure, Hypertension, Type II Clustered Wound: No Diabetes, Gout Photos Photo Uploaded By: Alric Quan on 03/25/2015 12:23:03 Wound Measurements Length: (cm) 0.5 Width: (cm) 0.3 Depth: (cm) 0.4 Area: (cm) 0.118 Volume: (cm) 0.047 % Reduction in Area: 93.7% % Reduction in Volume: 87.5% Epithelialization: None Tunneling: No Undermining: Yes Starting Position (o'clock): 1 Maximum Distance: (cm) 0.2 Wound Description Full Thickness Without Classification: Exposed Support Structures Diabetic Severity Grade 1 (Wagner): Wound Margin: Flat and Intact Exudate Amount: Large Exudate Type: Serous Exudate Color: amber KAHNE, HELFAND. ([id number]) Foul Odor After Cleansing: No Wound Bed Granulation Amount: Medium (34-66%) Exposed Structure Granulation Quality: Red, Pink Fascia Exposed: No Necrotic Amount: Medium (34-66%) Fat Layer Exposed: No Necrotic Quality: Adherent Slough Tendon Exposed: No Muscle Exposed: No Joint Exposed: No Bone Exposed: No Limited to Skin Breakdown Periwound Skin Texture Texture Color No Abnormalities Noted: No No Abnormalities Noted: No Callus: No Atrophie Blanche: No Crepitus: No Cyanosis:  No Excoriation: No Ecchymosis: No Fluctuance: No Erythema: No Friable: No Hemosiderin Staining: No Induration: No Mottled: No Localized Edema: Yes Pallor: No Rash: No Rubor: No Scarring: No Temperature / Pain Moisture Tenderness on Palpation: Yes No Abnormalities Noted: No Dry / Scaly: No Maceration: No Moist: Yes Wound Preparation Ulcer Cleansing: Rinsed/Irrigated with Saline Topical Anesthetic Applied: Other: lidocaine 4%, Treatment Notes Wound #3 (Right, Medial Lower Leg) 1. Cleansed with: Clean wound with Normal Saline 2. Anesthetic Topical Lidocaine 4% cream to wound bed prior to debridement 4. Dressing Applied: Aquacel Ag 5. [place] Signature(s) Signed: 03/25/2015 4:52:05 PM By: Williemae Natter ([id number]) Entered By: Alric Quan on 03/25/2015 08:38:33 BRAISON, SNOKE ([id number]) -------------------------------------------------------------------------------- Wound Assessment Details Patient Name: [name] Date of Service: 03/25/2015 8:00 AM Medical Record Number: [id number] Patient Account Number: [id number] Date of Birth/Sex: 05/10/1965 (49 y.o. Male) Treating RN: Ahmed Prima Primary Care Physician: [name], [place] Other Clinician: Referring Physician: [name], MELINDA Treating Physician/Extender: Frann Rider in Treatment: 11 Wound Status Wound Number: 4 Primary Venous Leg  Ulcer Etiology: Wound Location: Right Lower Leg - Distal Wound Open Wounding Event: Gradually Appeared Status: Date Acquired: 12/13/2014 Comorbid Lymphedema, Congestive Heart Weeks Of Treatment: 11 History: Failure, Hypertension, Type II Clustered Wound: No Diabetes, Gout Photos Photo Uploaded By: Alric Quan on 03/25/2015 12:23:12 Wound Measurements Length: (cm) 0.2 Width: (cm) 0.2 Depth: (cm) 0.2 Area: (cm) 0.031 Volume: (cm) 0.006 % Reduction in Area: 98% % Reduction in  Volume: 98% Epithelialization: None Tunneling: No Undermining: Yes Starting Position (o'clock): 1 Maximum Distance: (cm) 0.2 Wound Description Full Thickness Without Classification: Exposed Support Structures Diabetic Severity Grade 1 (Wagner): Wound Margin: Flat and Intact Exudate Amount: Large Exudate Type: Serous Exudate Color: amber RAYN, ENDERSON. ([id number]) Foul Odor After Cleansing: No Wound Bed Granulation Amount: Medium (34-66%) Exposed Structure Granulation Quality: Red, Pink Fascia Exposed: No Necrotic Amount: Medium (34-66%) Fat Layer Exposed: No Necrotic Quality: Adherent Slough Tendon Exposed: No Muscle Exposed: No Joint Exposed: No Bone Exposed: No Limited to Skin Breakdown Periwound Skin Texture Texture Color No Abnormalities Noted: No No Abnormalities Noted: No Callus: No Atrophie Blanche: No Crepitus: No Cyanosis: No Excoriation: No Ecchymosis: No Fluctuance: No Erythema: No Friable: No Hemosiderin Staining: No Induration: No Mottled: No Localized Edema: Yes Pallor: No Rash: No Rubor: No Scarring: No Temperature / Pain Moisture Temperature: No Abnormality No Abnormalities Noted: No Tenderness on Palpation: Yes Dry / Scaly: No Maceration: No Moist: Yes Wound Preparation Ulcer Cleansing: Rinsed/Irrigated with Saline Topical Anesthetic Applied: Other: lidocaine 4%, Treatment Notes Wound #4 (Right, Distal Lower Leg) 1. Cleansed with: Clean wound with Normal Saline 2. Anesthetic Topical Lidocaine 4% cream to wound bed prior to debridement 4. Dressing Applied: Aquacel Ag 5. Secondary [place] Signature(s) Signed: 03/25/2015 4:52:05 PM By: Williemae Natter ([id number]) Entered By: Alric Quan on 03/25/2015 08:36:51 JAESEAN, LITZAU ([id number]) -------------------------------------------------------------------------------- [place] Details Patient Name: [name] Date of Service: 03/25/2015 8:00 AM Medical Record Number: [id number] Patient Account Number: [id number] Date of Birth/Sex: Nov 07, 1965 (49 y.o. Male) Treating RN: Ahmed Prima Primary Care Physician: [name], [place] Other Clinician: Referring Physician: [name], [place] Treating Physician/Extender: Frann Rider in Treatment: 11 Vital Signs Time Taken: 08:30 Temperature (F): 97.6 Height (in): 71 Pulse (bpm): 75 Weight (lbs): 360 Respiratory Rate (breaths/min): 20 Body Mass Index (BMI): 50.2 Blood Pressure (mmHg): 150/99 Reference Range: 80 - 120 mg / dl Electronic Signature(s) Signed: 03/25/2015 4:52:05 PM By: Alric Quan Entered By: Alric Quan on 03/25/2015 08:31:42

## 2015-03-26 NOTE — Progress Notes (Signed)
VALTON, HAPP ([id number]) Visit Report for 03/25/2015 Chief Complaint Document Details Patient Name: [name], [name]. Date of Service: 03/25/2015 8:00 AM Medical Record Number: [id number] Patient Account Number: [id number] Date of Birth/Sex: [birth year] (50 y.o. Male) Treating RN: Ahmed Prima Primary Care Physician: [name], [place] Other Clinician: Referring Physician: [name], MELINDA Treating Physician/Extender: Frann Rider in Treatment: 11 Information Obtained from: Patient Chief Complaint Patients presents for treatment of an open diabetic ulcer over the right lower extremity with swelling of both legs for about 1 month. Electronic Signature(s) Signed: 03/25/2015 8:50:33 AM By: Christin Fudge MD, FACS Entered By: Christin Fudge on 03/25/2015 08:50:33 [name] ([id number]) -------------------------------------------------------------------------------- HPI Details Patient Name: [name] Date of Service: 03/25/2015 8:00 AM Medical Record Number: [id number] Patient Account Number: [id number] Date of Birth/Sex: [birth year] (50 y.o. Male) Treating RN: Ahmed Prima Primary Care Physician: [name], [place] Other Clinician: Referring Physician: [name], MELINDA Treating Physician/Extender: Frann Rider in Treatment: 11 History of Present Illness Location: Started getting pustules and boils on his right lower extremity along with swelling and was treated for this Quality: Patient reports experiencing a dull pain to affected area(s). Severity: Patient states wound are getting worse. Duration: Patient has had the wound for < 4 weeks prior to presenting for treatment Timing: Pain in wound is Intermittent (comes and goes Context: The wound appeared gradually over time Modifying Factors: Other treatment(s) tried include: local care and antibiotics and some compression stockings. Associated Signs and Symptoms: Patient reports having difficulty standing for  long periods. HPI Description: 50 year old gentleman who has type 2 diabetes mellitus and morbid obesity has been having cellulitis and abscess of the right lower leg for the last 3 weeks. He has had multiple antibiotics and an IandD and over the last 10 days has been on Septra DS and doxycycline and prior to that Keflex. the patient is a poor historian and does not give a definite timeline but says he's been diagnosed with diabetes mellitus for about 8-10 months and has been on insulin. He does not measure his blood sugars regularly nor does he see his PCP regularly. Also has congestive heart failure for which he sees a cardiologist at [facility] but this is going to change as the cardiologist as retired. He is a smoker and smokes several cigars a day and drinks alcohol occasionally. He has been morbidly obese for several years. 01/08/2015 -- the patient complains about having to use his 3 layer compression wrap and has multiple issues with this none of them being medically oriented. He also continues to smoke, has not yet seen his cardiologist or his PCP and in general has continued to not look after himself. 01/15/2015 -- the patient did not allow [place] to put his 3 layer compression wrap last week and has been using his own compression stockings. Continues to smoke. He did see his cardiologist and they have increased his dose of Lasix, asked him to repeat an echo, lose weight and be compliant with his diabetic control. 01/29/2015 -- he has given up smoking for about the last week. He is unable to keep his appointment on 01/30/2015 with the vascular surgeons and has rescheduled it for next week. 02/12/2015 -- he continues to have a lot of edema and drains from the wounds. However he is not agreeable to wear 3 or 4 layer compression wraps. He wants to wear his 20-30 mm compression stockings only. 02/19/2015 -- he was seen by the vascular surgeon Dr. Delana Meyer on 02/18/2015 and a  duplex  ultrasound of the lower extremities shows normal deep venous system, superficial reflux is not present in the great saphenous veins bilaterally and the left small saphenous vein. There is reflux in the small saphenous vein on the right and there are several sporadic varicosities and the distal thigh on the right. He was not offered surgery but graduated compression stockings class I were recommended on a daily basis and was given a prescription for this. He would follow-up with them in 3 months for possible using of lymph pumps at that GEROLD, [place]IG[place]. ([id number]) time. 03/25/2015 -- he has been taking his diuretic regularly and uses his compression stockings as prescribed. He has become much more compliant recently. Electronic Signature(s) Signed: 03/25/2015 8:51:06 AM By: Christin Fudge MD, FACS Entered By: Christin Fudge on 03/25/2015 08:51:06 [name] ([id number]) -------------------------------------------------------------------------------- Physical Exam Details Patient Name: [name] Date of Service: 03/25/2015 8:00 AM Medical Record Number: [id number] Patient Account Number: [id number] Date of Birth/Sex: 1965/11/01 (50 y.o. Male) Treating RN: Ahmed Prima Primary Care Physician: [name], [place] Other Clinician: Referring Physician: [name], MELINDA Treating Physician/Extender: Frann Rider in Treatment: 11 Constitutional . Pulse regular. Respirations normal and unlabored. Afebrile. . Eyes Nonicteric. Reactive to light. Ears, Nose, Mouth, and Throat Lips, teeth, and gums WNL.[name] Kitchen Moist mucosa without lesions. Neck supple and nontender. No palpable supraclavicular or cervical adenopathy. Normal sized without goiter. Respiratory WNL. No retractions.. Cardiovascular Pedal Pulses WNL. No clubbing, cyanosis or edema. Lymphatic No adneopathy. No adenopathy. No adenopathy. Musculoskeletal Adexa without tenderness or enlargement.. Digits and nails w/o  clubbing, cyanosis, infection, petechiae, ischemia, or inflammatory conditions.. Integumentary (Hair, Skin) No suspicious lesions. No crepitus or fluctuance. No peri-wound warmth or erythema. No masses.[name] Kitchen Psychiatric Judgement and insight Intact.. No evidence of depression, anxiety, or agitation.. Notes the wounds are draining less and the depth is much better than before. There is minimal edema and no evidence of excoriation. No debridement was required today. Electronic Signature(s) Signed: 03/25/2015 8:51:39 AM By: Christin Fudge MD, FACS Entered By: Christin Fudge on 03/25/2015 08:51:39 [name] ([id number]) -------------------------------------------------------------------------------- Physician Orders Details Patient Name: [name] Date of Service: 03/25/2015 8:00 AM Medical Record Number: [id number] Patient Account Number: [id number] Date of Birth/Sex: Oct 11, 1965 (50 y.o. Male) Treating RN: Ahmed Prima Primary Care Physician: [name], [place] Other Clinician: Referring Physician: [name], MELINDA Treating Physician/Extender: Frann Rider in Treatment: 54 Verbal / Phone Orders: Yes ClinicianCarolyne Fiscal, Debi Read Back and Verified: Yes Diagnosis Coding Wound Cleansing Wound #2 Right,Lateral,Superior Lower Leg o Cleanse wound with mild soap and water o May Shower, gently pat wound dry prior to applying new dressing. Wound #3 Right,Medial Lower Leg o Cleanse wound with mild soap and water o May Shower, gently pat wound dry prior to applying new dressing. Wound #4 Right,Distal Lower Leg o Cleanse wound with mild soap and water o May Shower, gently pat wound dry prior to applying new dressing. Anesthetic Wound #2 Right,Lateral,Superior Lower Leg o Topical Lidocaine 4% cream applied to wound bed prior to debridement Wound #3 Right,Medial Lower Leg o Topical Lidocaine 4% cream applied to wound bed prior to debridement Wound #4  Right,Distal Lower Leg o Topical Lidocaine 4% cream applied to wound bed prior to debridement Primary Wound Dressing Wound #2 Right,Lateral,Superior Lower Leg o Aquacel Ag Wound #3 Right,Medial Lower Leg o Aquacel Ag Wound #4 Right,Distal Lower Leg o Aquacel Ag Secondary Dressing Wound #2 Right,Lateral,Superior Lower Leg o Non-adherent pad [address], [place]. ([id number]) Wound #  3 Right,Medial Lower Leg o Non-adherent pad - telfa island Wound #4 Right,Distal Lower Leg o Non-adherent pad - telfa island Dressing Change Frequency Wound #2 Right,Lateral,Superior Lower Leg o Change dressing every other day. Wound #3 Right,Medial Lower Leg o Change dressing every other day. Wound #4 Right,Distal Lower Leg o Change dressing every other day. Follow-up Appointments Wound #2 Right,Lateral,Superior Lower Leg o Return Appointment in 1 week. Wound #3 Right,Medial Lower Leg o Return Appointment in 1 week. Wound #4 Right,Distal Lower Leg o Return Appointment in 1 week. Edema Control Wound #2 Right,Lateral,Superior Lower Leg o Patient to wear own compression stockings o Elevate legs to the level of the heart and pump ankles as often as possible Wound #3 Right,Medial Lower Leg o Patient to wear own compression stockings o Elevate legs to the level of the heart and pump ankles as often as possible Wound #4 Right,Distal Lower Leg o Patient to wear own compression stockings o Elevate legs to the level of the heart and pump ankles as often as possible Electronic Signature(s) Signed: 03/25/2015 4:45:18 PM By: Christin Fudge MD, FACS Signed: 03/25/2015 4:52:05 PM By: Alric Quan Entered By: Alric Quan on 03/25/2015 08:44:35 [name] ([id number]) -------------------------------------------------------------------------------- Problem List Details Patient Name: [name] Date of Service: 03/25/2015 8:00 AM Medical  Record Number: [id number] Patient Account Number: [id number] Date of Birth/Sex: [birth year] (50 y.o. Male) Treating RN: Ahmed Prima Primary Care Physician: [name], [place] Other Clinician: Referring Physician: [name], [place] Treating Physician/Extender: Frann Rider in Treatment: 11 Active Problems ICD-10 Encounter Code Description Active Date Diagnosis E11.622 Type 2 diabetes mellitus with other skin ulcer 01/01/2015 Yes E66.01 Morbid (severe) obesity due to excess calories 01/01/2015 Yes L97.212 Non-pressure chronic ulcer of right calf with fat layer 02/05/2015 Yes exposed I89.0 Lymphedema, not elsewhere classified 01/01/2015 Yes I50.40 Unspecified combined systolic (congestive) and diastolic [id number] Yes (congestive) heart failure F17.218 Nicotine dependence, cigarettes, with other nicotine- 01/01/2015 Yes induced disorders 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 02/19/2015 Yes Inactive Problems Resolved Problems ICD-10 Code Description Active Date Resolved Date L02.415 Cutaneous abscess of right lower limb 01/01/2015 01/01/2015 [name] ([id number]) Electronic Signature(s) Signed: 03/25/2015 8:50:27 AM By: Christin Fudge MD, FACS Entered By: Christin Fudge on 03/25/2015 08:50:27 [name] ([id number]) -------------------------------------------------------------------------------- Progress Note Details Patient Name: [name] Date of Service: 03/25/2015 8:00 AM Medical Record Number: [id number] Patient Account Number: [id number] Date of Birth/Sex: 02-17-1965 (50 y.o. Male) Treating RN: Ahmed Prima Primary Care Physician: [name], [place] Other Clinician: Referring Physician: [name], MELINDA Treating Physician/Extender: Frann Rider in Treatment: 11 Subjective Chief Complaint Information obtained from Patient Patients presents for treatment of an open diabetic ulcer over the right lower extremity with swelling of  both legs for about 1 month. History of Present Illness (HPI) The following HPI elements were documented for the patient's wound: Location: Started getting pustules and boils on his right lower extremity along with swelling and was treated for this Quality: Patient reports experiencing a dull pain to affected area(s). Severity: Patient states wound are getting worse. Duration: Patient has had the wound for < 4 weeks prior to presenting for treatment Timing: Pain in wound is Intermittent (comes and goes Context: The wound appeared gradually over time Modifying Factors: Other treatment(s) tried include: local care and antibiotics and some compression stockings. Associated Signs and Symptoms: Patient reports having difficulty standing for long periods. 50 year old gentleman who has type 2 diabetes mellitus and morbid obesity has been having cellulitis  and abscess of the right lower leg for the last 3 weeks. He has had multiple antibiotics and an IandD and over the last 10 days has been on Septra DS and doxycycline and prior to that Keflex. the patient is a poor historian and does not give a definite timeline but says he's been diagnosed with diabetes mellitus for about 8-10 months and has been on insulin. He does not measure his blood sugars regularly nor does he see his PCP regularly. Also has congestive heart failure for which he sees a cardiologist at [facility] but this is going to change as the cardiologist as retired. He is a smoker and smokes several cigars a day and drinks alcohol occasionally. He has been morbidly obese for several years. 01/08/2015 -- the patient complains about having to use his 3 layer compression wrap and has multiple issues with this none of them being medically oriented. He also continues to smoke, has not yet seen his cardiologist or his PCP and in general has continued to not look after himself. 01/15/2015 -- the patient did not allow [place] to put his 3 layer  compression wrap last week and has been using his own compression stockings. Continues to smoke. He did see his cardiologist and they have increased his dose of Lasix, asked him to repeat an echo, lose weight and be compliant with his diabetic control. 01/29/2015 -- he has given up smoking for about the last week. He is unable to keep his appointment on 01/30/2015 with the vascular surgeons and has rescheduled it for next week. 02/12/2015 -- he continues to have a lot of edema and drains from the wounds. However he is not JAMARQUS, MCSHEA. ([id number]) agreeable to wear 3 or 4 layer compression wraps. He wants to wear his 20-30 mm compression stockings only. 02/19/2015 -- he was seen by the vascular surgeon Dr. Delana Meyer on 02/18/2015 and a duplex ultrasound of the lower extremities shows normal deep venous system, superficial reflux is not present in the great saphenous veins bilaterally and the left small saphenous vein. There is reflux in the small saphenous vein on the right and there are several sporadic varicosities and the distal thigh on the right. He was not offered surgery but graduated compression stockings class I were recommended on a daily basis and was given a prescription for this. He would follow-up with them in 3 months for possible using of lymph pumps at that time. 03/25/2015 -- he has been taking his diuretic regularly and uses his compression stockings as prescribed. He has become much more compliant recently. Objective Constitutional Pulse regular. Respirations normal and unlabored. Afebrile. Vitals Time Taken: 8:30 AM, Height: 71 in, Weight: 360 lbs, BMI: 50.2, Temperature: 97.6 F, Pulse: 75 bpm, Respiratory Rate: 20 breaths/min, Blood Pressure: 150/99 mmHg. Eyes Nonicteric. Reactive to light. Ears, Nose, Mouth, and Throat Lips, teeth, and gums WNL.[name] Kitchen Moist mucosa without lesions. Neck supple and nontender. No palpable supraclavicular or cervical adenopathy. Normal  sized without goiter. Respiratory WNL. No retractions.. Cardiovascular Pedal Pulses WNL. No clubbing, cyanosis or edema. Lymphatic No adneopathy. No adenopathy. No adenopathy. Musculoskeletal Adexa without tenderness or enlargement.. Digits and nails w/o clubbing, cyanosis, infection, petechiae, ischemia, or inflammatory conditions.[name] Kitchen Psychiatric YONAEL, [place] ([id number]) Judgement and insight Intact.. No evidence of depression, anxiety, or agitation.. General Notes: the wounds are draining less and the depth is much better than before. There is minimal edema and no evidence of excoriation. No debridement was required today. Integumentary (Hair, Skin) No suspicious  lesions. No crepitus or fluctuance. No peri-wound warmth or erythema. No masses.. Wound #2 status is Open. Original cause of wound was Gradually Appeared. The wound is located on the Right,Lateral,Superior Lower Leg. The wound measures 0.4cm length x 0.4cm width x 1.4cm depth; 0.126cm^2 area and 0.176cm^3 volume. The wound is limited to skin breakdown. There is no tunneling or undermining noted. There is a large amount of purulent drainage noted. The wound margin is flat and intact. There is large (67-100%) pink granulation within the wound bed. There is a small (1-33%) amount of necrotic tissue within the wound bed including Adherent Slough. The periwound skin appearance exhibited: Localized Edema, Moist. Periwound temperature was noted as No Abnormality. The periwound has tenderness on palpation. Wound #3 status is Open. Original cause of wound was Gradually Appeared. The wound is located on the Right,Medial Lower Leg. The wound measures 0.5cm length x 0.3cm width x 0.4cm depth; 0.118cm^2 area and 0.047cm^3 volume. The wound is limited to skin breakdown. There is no tunneling noted, however, there is undermining starting at 1:00 and ending at :00 with a maximum distance of 0.2cm. There is a large amount of serous  drainage noted. The wound margin is flat and intact. There is medium (34-66%) red, pink granulation within the wound bed. There is a medium (34-66%) amount of necrotic tissue within the wound bed including Adherent Slough. The periwound skin appearance exhibited: Localized Edema, Moist. The periwound skin appearance did not exhibit: Callus, Crepitus, Excoriation, Fluctuance, Friable, Induration, Rash, Scarring, Dry/Scaly, Maceration, Atrophie Blanche, Cyanosis, Ecchymosis, Hemosiderin Staining, Mottled, Pallor, Rubor, Erythema. The periwound has tenderness on palpation. Wound #4 status is Open. Original cause of wound was Gradually Appeared. The wound is located on the Right,Distal Lower Leg. The wound measures 0.2cm length x 0.2cm width x 0.2cm depth; 0.031cm^2 area and 0.006cm^3 volume. The wound is limited to skin breakdown. There is no tunneling noted, however, there is undermining starting at 1:00 and ending at :00 with a maximum distance of 0.2cm. There is a large amount of serous drainage noted. The wound margin is flat and intact. There is medium (34-66%) red, pink granulation within the wound bed. There is a medium (34-66%) amount of necrotic tissue within the wound bed including Adherent Slough. The periwound skin appearance exhibited: Localized Edema, Moist. The periwound skin appearance did not exhibit: Callus, Crepitus, Excoriation, Fluctuance, Friable, Induration, Rash, Scarring, Dry/Scaly, Maceration, Atrophie Blanche, Cyanosis, Ecchymosis, Hemosiderin Staining, Mottled, Pallor, Rubor, Erythema. Periwound temperature was noted as No Abnormality. The periwound has tenderness on palpation. Assessment Active Problems ICD-10 E11.622 - Type 2 diabetes mellitus with other skin ulcer CLINTIN, VANDYKEN. ([id number]) E66.01 - Morbid (severe) obesity due to excess calories L97.212 - Non-pressure chronic ulcer of right calf with fat layer exposed I89.0 - Lymphedema, not elsewhere  classified I50.40 - Unspecified combined systolic (congestive) and diastolic (congestive) heart failure F17.218 - Nicotine dependence, cigarettes, with other nicotine-induced disorders I83.012 - 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 Plan Wound Cleansing: Wound #2 Right,Lateral,Superior Lower Leg: Cleanse wound with mild soap and water May Shower, gently pat wound dry prior to applying new dressing. Wound #3 Right,Medial Lower Leg: Cleanse wound with mild soap and water May Shower, gently pat wound dry prior to applying new dressing. Wound #4 Right,Distal Lower Leg: Cleanse wound with mild soap and water May Shower, gently pat wound dry prior to applying new dressing. Anesthetic: Wound #2 Right,Lateral,Superior Lower Leg: Topical Lidocaine 4% cream applied to wound bed prior to  debridement Wound #3 Right,Medial Lower Leg: Topical Lidocaine 4% cream applied to wound bed prior to debridement Wound #4 Right,Distal Lower Leg: Topical Lidocaine 4% cream applied to wound bed prior to debridement Primary Wound Dressing: Wound #2 Right,Lateral,Superior Lower Leg: Aquacel Ag Wound #3 Right,Medial Lower Leg: Aquacel Ag Wound #4 Right,Distal Lower Leg: Aquacel Ag Secondary Dressing: Wound #2 Right,Lateral,Superior Lower Leg: Non-adherent pad - telfa island Wound #3 Right,Medial Lower Leg: Non-adherent pad - telfa island Wound #4 Right,Distal Lower Leg: Non-adherent pad - telfa island Dressing Change Frequency: Wound #2 Right,Lateral,Superior Lower Leg: Change dressing every other day. Wound #3 Right,Medial Lower Leg: Change dressing every other day. TAEVIAN, REGEHR ([id number]) Wound #4 Right,Distal Lower Leg: Change dressing every other day. Follow-up Appointments: Wound #2 Right,Lateral,Superior Lower Leg: Return Appointment in 1 week. Wound #3 Right,Medial Lower Leg: Return Appointment in 1 week. Wound #4 Right,Distal Lower Leg: Return Appointment in 1  week. Edema Control: Wound #2 Right,Lateral,Superior Lower Leg: Patient to wear own compression stockings Elevate legs to the level of the heart and pump ankles as often as possible Wound #3 Right,Medial Lower Leg: Patient to wear own compression stockings Elevate legs to the level of the heart and pump ankles as often as possible Wound #4 Right,Distal Lower Leg: Patient to wear own compression stockings Elevate legs to the level of the heart and pump ankles as often as possible Local care will continue with packing of silver alginate and using his compression stockings. elevation and exercise and being compliant has been discussed with him and he agrees Come back and see me next week. Electronic Signature(s) Signed: 03/25/2015 8:52:14 AM By: Christin Fudge MD, FACS Entered By: Christin Fudge on 03/25/2015 08:52:14 [name] ([id number]) -------------------------------------------------------------------------------- SuperBill Details Patient Name: [name] Date of Service: 03/25/2015 Medical Record Number: [id number] Patient Account Number: [id number] Date of Birth/Sex: August 31, 1965 (50 y.o. Male) Treating RN: Ahmed Prima Primary Care Physician: [name], [place] Other Clinician: Referring Physician: [name], MELINDA Treating Physician/Extender: Frann Rider in Treatment: 11 Diagnosis Coding ICD-10 Codes Code Description E11.622 Type 2 diabetes mellitus with other skin ulcer E66.01 Morbid (severe) obesity due to excess calories L97.212 Non-pressure chronic ulcer of right calf with fat layer exposed I89.0 Lymphedema, not elsewhere classified I50.40 Unspecified combined systolic (congestive) and diastolic (congestive) heart failure F17.218 Nicotine dependence, cigarettes, with other nicotine-induced disorders 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 Physician Procedures CPT4 Code: [id number] Description: 99213 - WC PHYS LEVEL 3 - EST PT  ICD-10 Description Diagnosis E11.622 Type 2 diabetes mellitus with other skin ulcer L97.212 Non-pressure chronic ulcer of right calf with fat I89.0 Lymphedema, not elsewhere classified I83.012 Varicose veins  of right lower extremity with ulce Modifier: layer exposed r of calf Quantity: 1 Electronic Signature(s) Signed: 03/25/2015 8:52:32 AM By: Christin Fudge MD, FACS Entered By: Christin Fudge on 03/25/2015 08:52:32

## 2015-03-27 ENCOUNTER — Encounter: Payer: Self-pay | Admitting: Family Medicine

## 2015-03-27 NOTE — Telephone Encounter (Signed)
[facility] requesting patient to return my call.  Will inform patient that he is being dismissed from the practice due to non compliance and habitually NO SHOWING for appointments.

## 2015-03-29 ENCOUNTER — Encounter: Payer: Self-pay | Admitting: Family Medicine

## 2015-04-01 ENCOUNTER — Encounter: Payer: Managed Care, Other (non HMO) | Attending: Surgery | Admitting: Surgery

## 2015-04-01 DIAGNOSIS — I83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: Secondary | ICD-10-CM | POA: Diagnosis not present

## 2015-04-01 DIAGNOSIS — E11622 Type 2 diabetes mellitus with other skin ulcer: Secondary | ICD-10-CM | POA: Diagnosis present

## 2015-04-01 DIAGNOSIS — F17218 Nicotine dependence, cigarettes, with other nicotine-induced disorders: Secondary | ICD-10-CM | POA: Diagnosis not present

## 2015-04-01 DIAGNOSIS — I5040 Unspecified combined systolic (congestive) and diastolic (congestive) heart failure: Secondary | ICD-10-CM | POA: Diagnosis not present

## 2015-04-01 DIAGNOSIS — L97212 Non-pressure chronic ulcer of right calf with fat layer exposed: Secondary | ICD-10-CM | POA: Insufficient documentation

## 2015-04-01 DIAGNOSIS — I890 Lymphedema, not elsewhere classified: Secondary | ICD-10-CM | POA: Diagnosis not present

## 2015-04-01 DIAGNOSIS — Z6841 Body Mass Index (BMI) 40.0 and over, adult: Secondary | ICD-10-CM | POA: Insufficient documentation

## 2015-04-01 DIAGNOSIS — I110 Hypertensive heart disease with heart failure: Secondary | ICD-10-CM | POA: Diagnosis not present

## 2015-04-01 NOTE — Progress Notes (Addendum)
NARYAN, PHANEUF ([id number]) Visit Report for 04/01/2015 Chief Complaint Document Details Patient Name: [name], [name]. Date of Service: 04/01/2015 8:00 AM Medical Record Number: [id number] Patient Account Number: [id number] Date of Birth/Sex: 1965-05-24 (49 y.o. Male) Treating RN: Ahmed Prima Primary Care Physician: [name], Benedict Other Clinician: Referring Physician: [name], MELINDA Treating Physician/Extender: Frann Rider in Treatment: 12 Information Obtained from: Patient Chief Complaint Patients presents for treatment of an open diabetic ulcer over the right lower extremity with swelling of both legs for about 1 month. Electronic Signature(s) Signed: 04/01/2015 8:44:36 AM By: Christin Fudge MD, FACS Entered By: Christin Fudge on 04/01/2015 08:44:36 [name] ([id number]) -------------------------------------------------------------------------------- HPI Details Patient Name: [name] Date of Service: 04/01/2015 8:00 AM Medical Record Number: [id number] Patient Account Number: [id number] Date of Birth/Sex: 12/12/1965 (49 y.o. Male) Treating RN: Ahmed Prima Primary Care Physician: [name], [place] Other Clinician: Referring Physician: [name], MELINDA Treating Physician/Extender: Frann Rider in Treatment: 12 History of Present Illness Location: Started getting pustules and boils on his right lower extremity along with swelling and was treated for this Quality: Patient reports experiencing a dull pain to affected area(s). Severity: Patient states wound are getting worse. Duration: Patient has had the wound for < 4 weeks prior to presenting for treatment Timing: Pain in wound is Intermittent (comes and goes Context: The wound appeared gradually over time Modifying Factors: Other treatment(s) tried include: local care and antibiotics and some compression stockings. Associated Signs and Symptoms: Patient reports having difficulty standing for long  periods. HPI Description: 50 year old gentleman who has type 2 diabetes mellitus and morbid obesity has been having cellulitis and abscess of the right lower leg for the last 3 weeks. He has had multiple antibiotics and an IandD and over the last 10 days has been on Septra DS and doxycycline and prior to that Keflex. the patient is a poor historian and does not give a definite timeline but says he's been diagnosed with diabetes mellitus for about 8-10 months and has been on insulin. He does not measure his blood sugars regularly nor does he see his PCP regularly. Also has congestive heart failure for which he sees a cardiologist at [facility] but this is going to change as the cardiologist as retired. He is a smoker and smokes several cigars a day and drinks alcohol occasionally. He has been morbidly obese for several years. 01/08/2015 -- the patient complains about having to use his 3 layer compression wrap and has multiple issues with this none of them being medically oriented. He also continues to smoke, has not yet seen his cardiologist or his PCP and in general has continued to not look after himself. 01/15/2015 -- the patient did not allow [place] to put his 3 layer compression wrap last week and has been using his own compression stockings. Continues to smoke. He did see his cardiologist and they have increased his dose of Lasix, asked him to repeat an echo, lose weight and be compliant with his diabetic control. 01/29/2015 -- he has given up smoking for about the last week. He is unable to keep his appointment on 01/30/2015 with the vascular surgeons and has rescheduled it for next week. 02/12/2015 -- he continues to have a lot of edema and drains from the wounds. However he is not agreeable to wear 3 or 4 layer compression wraps. He wants to wear his 20-30 mm compression stockings only. 02/19/2015 -- he was seen by the vascular surgeon Dr. Delana Meyer on 02/18/2015 and a  duplex ultrasound  of the lower extremities shows normal deep venous system, superficial reflux is not present in the great saphenous veins bilaterally and the left small saphenous vein. There is reflux in the small saphenous vein on the right and there are several sporadic varicosities and the distal thigh on the right. He was not offered surgery but graduated compression stockings class I were recommended on a daily basis and was given a prescription for this. He would follow-up with them in 3 months for possible using of lymph pumps at that [name], [name]. ([id number]) time. 03/25/2015 -- he has been taking his diuretic regularly and uses his compression stockings as prescribed. He has become much more compliant recently. Electronic Signature(s) Signed: 04/01/2015 8:44:44 AM By: Christin Fudge MD, FACS Entered By: Christin Fudge on 04/01/2015 08:44:44 [name] ([id number]) -------------------------------------------------------------------------------- Physical Exam Details Patient Name: [name] Date of Service: 04/01/2015 8:00 AM Medical Record Number: [id number] Patient Account Number: [id number] Date of Birth/Sex: [birth year] (49 y.o. Male) Treating RN: Ahmed Prima Primary Care Physician: [name], [place] Other Clinician: Referring Physician: [name], MELINDA Treating Physician/Extender: Frann Rider in Treatment: 12 Constitutional . Pulse regular. Respirations normal and unlabored. Afebrile. . Eyes Nonicteric. Reactive to light. Ears, Nose, Mouth, and Throat Lips, teeth, and gums WNL.[name] Kitchen Moist mucosa without lesions. Neck supple and nontender. No palpable supraclavicular or cervical adenopathy. Normal sized without goiter. Respiratory WNL. No retractions.. Cardiovascular Heart rhythm and rate regular, no murmur or gallop.. Pedal Pulses WNL. No clubbing, cyanosis or edema. Lymphatic No adneopathy. No adenopathy. No adenopathy. Musculoskeletal Adexa without tenderness or  enlargement.. Digits and nails w/o clubbing, cyanosis, infection, petechiae, ischemia, or inflammatory conditions.. Integumentary (Hair, Skin) No suspicious lesions. No crepitus or fluctuance. No peri-wound warmth or erythema. No masses.[name] Kitchen Psychiatric Judgement and insight Intact.. No evidence of depression, anxiety, or agitation.. Notes the 2 lower wounds are very shallow and we will start him on Prisma AG. The most superior wound has some depth but it is narrow and am also going to pack this with silver collagen. Electronic Signature(s) Signed: 04/01/2015 8:45:18 AM By: Christin Fudge MD, FACS Entered By: Christin Fudge on 04/01/2015 08:45:18 [name] ([id number]) -------------------------------------------------------------------------------- Physician Orders Details Patient Name: [name] Date of Service: 04/01/2015 8:00 AM Medical Record Number: [id number] Patient Account Number: [id number] Date of Birth/Sex: 03/28/1965 (49 y.o. Male) Treating RN: Ahmed Prima Primary Care Physician: [name], [place] Other Clinician: Referring Physician: [name], MELINDA Treating Physician/Extender: Frann Rider in Treatment: 60 Verbal / Phone Orders: Yes ClinicianCarolyne Fiscal, Debi Read Back and Verified: Yes Diagnosis Coding ICD-10 Coding Code Description E11.622 Type 2 diabetes mellitus with other skin ulcer E66.01 Morbid (severe) obesity due to excess calories L97.212 Non-pressure chronic ulcer of right calf with fat layer exposed I89.0 Lymphedema, not elsewhere classified I50.40 Unspecified combined systolic (congestive) and diastolic (congestive) heart failure F17.218 Nicotine dependence, cigarettes, with other nicotine-induced disorders 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 Wound #3 Right,Medial Lower Leg o Cleanse wound with mild soap and water o May Shower, gently pat wound dry prior to applying new dressing. Wound #4 Right,Distal Lower Leg o Cleanse wound with mild soap and water o May Shower, gently pat wound dry prior to applying new dressing. Anesthetic Wound #2 Right,Lateral,Superior Lower Leg o Topical Lidocaine 4% cream applied to  wound bed prior to debridement Wound #3 Right,Medial Lower Leg o Topical Lidocaine 4% cream applied to wound bed prior to debridement Wound #4 Right,Distal Lower Leg o Topical Lidocaine 4% cream applied to wound bed prior to debridement Primary Wound Dressing Wound #2 Right,Lateral,Superior Lower Leg [name], [name]. ([id number]) o Prisma Ag Wound #3 Right,Medial Lower Leg o Prisma Ag Wound #4 Right,Distal Lower Leg o Prisma Ag Secondary Dressing Wound #2 Right,Lateral,Superior Lower Leg o Non-adherent pad - telfa island Wound #3 Right,Medial Lower Leg o Non-adherent pad - telfa island Wound #4 Right,Distal Lower Leg o Non-adherent pad - telfa island Dressing Change Frequency Wound #2 Right,Lateral,Superior Lower Leg o Change dressing every other day. Wound #3 Right,Medial Lower Leg o Change dressing every other day. Wound #4 Right,Distal Lower Leg o Change dressing every other day. Follow-up Appointments Wound #2 Right,Lateral,Superior Lower Leg o Return Appointment in 1 week. Wound #3 Right,Medial Lower Leg o Return Appointment in 1 week. Wound #4 Right,Distal Lower Leg o Return Appointment in 1 week. Edema Control Wound #2 Right,Lateral,Superior Lower Leg o Patient to wear own compression stockings o Elevate legs to the level of the heart and pump ankles as often as possible Wound #3 Right,Medial Lower Leg o Patient to wear own compression stockings o Elevate legs to the level of the heart and pump ankles as often as possible Wound #4  Right,Distal Lower Leg [name], [name] ([id number]) o Patient to wear own compression stockings o Elevate legs to the level of the heart and pump ankles as often as possible Electronic Signature(s) Signed: 04/01/2015 3:57:47 PM By: Christin Fudge MD, FACS Signed: 04/02/2015 5:37:49 PM By: Alric Quan Entered By: Alric Quan on 04/01/2015 08:52:16 [name] ([id number]) -------------------------------------------------------------------------------- Problem List Details Patient Name: [name] Date of Service: 04/01/2015 8:00 AM Medical Record Number: [id number] Patient Account Number: [id number] Date of Birth/Sex: Sep 26, 1965 (49 y.o. Male) Treating RN: Ahmed Prima Primary Care Physician: [name], [place] Other Clinician: Referring Physician: [name], [place] Treating Physician/Extender: Frann Rider in Treatment: 12 Active Problems ICD-10 Encounter Code Description Active Date Diagnosis E11.622 Type 2 diabetes mellitus with other skin ulcer 01/01/2015 Yes E66.01 Morbid (severe) obesity due to excess calories 01/01/2015 Yes L97.212 Non-pressure chronic ulcer of right calf with fat layer 02/05/2015 Yes exposed I89.0 Lymphedema, not elsewhere classified 01/01/2015 Yes I50.40 Unspecified combined systolic (congestive) and diastolic [id number] Yes (congestive) heart failure F17.218 Nicotine dependence, cigarettes, with other nicotine- 01/01/2015 Yes induced disorders 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 02/19/2015 Yes Inactive Problems Resolved Problems ICD-10 Code Description Active Date Resolved Date L02.415 Cutaneous abscess of right lower limb 01/01/2015 01/01/2015 [name] ([id number]) Electronic Signature(s) Signed: 04/01/2015 8:44:28 AM By: Christin Fudge MD, FACS Entered By: Christin Fudge on 04/01/2015 08:44:28 [name]  ([id number]) -------------------------------------------------------------------------------- Progress Note Details Patient Name: [name] Date of Service: 04/01/2015 8:00 AM Medical Record Number: [id number] Patient Account Number: [id number] Date of Birth/Sex: 1965-03-20 (50 y.o. Male) Treating RN: Ahmed Prima Primary Care Physician: [name], [place] Other Clinician: Referring Physician: [name], MELINDA Treating Physician/Extender: Frann Rider in Treatment: 12 Subjective Chief Complaint Information obtained from Patient Patients presents for treatment of an open diabetic ulcer over the right lower extremity with swelling of both legs for about 1 month. History of Present Illness (HPI) The following HPI elements were documented for the patient's wound: Location: Started getting pustules and boils on his right lower extremity along with swelling and was treated for this Quality:  Patient reports experiencing a dull pain to affected area(s). Severity: Patient states wound are getting worse. Duration: Patient has had the wound for < 4 weeks prior to presenting for treatment Timing: Pain in wound is Intermittent (comes and goes Context: The wound appeared gradually over time Modifying Factors: Other treatment(s) tried include: local care and antibiotics and some compression stockings. Associated Signs and Symptoms: Patient reports having difficulty standing for long periods. 50 year old gentleman who has type 2 diabetes mellitus and morbid obesity has been having cellulitis and abscess of the right lower leg for the last 3 weeks. He has had multiple antibiotics and an IandD and over the last 10 days has been on Septra DS and doxycycline and prior to that Keflex. the patient is a poor historian and does not give a definite timeline but says he's been diagnosed with diabetes mellitus for about 8-10 months and has been on insulin. He does not measure his blood  sugars regularly nor does he see his PCP regularly. Also has congestive heart failure for which he sees a cardiologist at [facility] but this is going to change as the cardiologist as retired. He is a smoker and smokes several cigars a day and drinks alcohol occasionally. He has been morbidly obese for several years. 01/08/2015 -- the patient complains about having to use his 3 layer compression wrap and has multiple issues with this none of them being medically oriented. He also continues to smoke, has not yet seen his cardiologist or his PCP and in general has continued to not look after himself. 01/15/2015 -- the patient did not allow [place] to put his 3 layer compression wrap last week and has been using his own compression stockings. Continues to smoke. He did see his cardiologist and they have increased his dose of Lasix, asked him to repeat an echo, lose weight and be compliant with his diabetic control. 01/29/2015 -- he has given up smoking for about the last week. He is unable to keep his appointment on 01/30/2015 with the vascular surgeons and has rescheduled it for next week. 02/12/2015 -- he continues to have a lot of edema and drains from the wounds. However he is not [name], [name]. ([id number]) agreeable to wear 3 or 4 layer compression wraps. He wants to wear his 20-30 mm compression stockings only. 02/19/2015 -- he was seen by the vascular surgeon Dr. Delana Meyer on 02/18/2015 and a duplex ultrasound of the lower extremities shows normal deep venous system, superficial reflux is not present in the great saphenous veins bilaterally and the left small saphenous vein. There is reflux in the small saphenous vein on the right and there are several sporadic varicosities and the distal thigh on the right. He was not offered surgery but graduated compression stockings class I were recommended on a daily basis and was given a prescription for this. He would follow-up with them in 3 months  for possible using of lymph pumps at that time. 03/25/2015 -- he has been taking his diuretic regularly and uses his compression stockings as prescribed. He has become much more compliant recently. Objective Constitutional Pulse regular. Respirations normal and unlabored. Afebrile. Vitals Time Taken: 8:12 AM, Height: 71 in, Weight: 360 lbs, BMI: 50.2, Temperature: 98.2 F, Pulse: 83 bpm, Respiratory Rate: 20 breaths/min, Blood Pressure: 161/97 mmHg. Eyes Nonicteric. Reactive to light. Ears, Nose, Mouth, and Throat Lips, teeth, and gums WNL.[name] Kitchen Moist mucosa without lesions. Neck supple and nontender. No palpable supraclavicular or cervical adenopathy. Normal sized without goiter.  Respiratory WNL. No retractions.. Cardiovascular Heart rhythm and rate regular, no murmur or gallop.. Pedal Pulses WNL. No clubbing, cyanosis or edema. Lymphatic No adneopathy. No adenopathy. No adenopathy. Musculoskeletal Adexa without tenderness or enlargement.. Digits and nails w/o clubbing, cyanosis, infection, petechiae, ischemia, or inflammatory conditions.[name] Kitchen Psychiatric [name], [name] ([id number]) Judgement and insight Intact.. No evidence of depression, anxiety, or agitation.. General Notes: the 2 lower wounds are very shallow and we will start him on Prisma AG. The most superior wound has some depth but it is narrow and am also going to pack this with silver collagen. Integumentary (Hair, Skin) No suspicious lesions. No crepitus or fluctuance. No peri-wound warmth or erythema. No masses.. Wound #2 status is Open. Original cause of wound was Gradually Appeared. The wound is located on the Right,Lateral,Superior Lower Leg. The wound measures 0.3cm length x 0.3cm width x 1.4cm depth; 0.071cm^2 area and 0.099cm^3 volume. The wound is limited to skin breakdown. There is no tunneling or undermining noted. There is a large amount of purulent drainage noted. The wound margin is flat and intact. There is  large (67-100%) pink granulation within the wound bed. There is a small (1-33%) amount of necrotic tissue within the wound bed including Adherent Slough. The periwound skin appearance exhibited: Localized Edema, Moist. Periwound temperature was noted as No Abnormality. The periwound has tenderness on palpation. Wound #3 status is Open. Original cause of wound was Gradually Appeared. The wound is located on the Right,Medial Lower Leg. The wound measures 0.1cm length x 0.1cm width x 0.3cm depth; 0.008cm^2 area and 0.002cm^3 volume. The wound is limited to skin breakdown. There is no tunneling or undermining noted. There is a large amount of serous drainage noted. The wound margin is flat and intact. There is medium (34-66%) red, pink granulation within the wound bed. There is a medium (34-66%) amount of necrotic tissue within the wound bed including Adherent Slough. The periwound skin appearance exhibited: Localized Edema, Moist. The periwound skin appearance did not exhibit: Callus, Crepitus, Excoriation, Fluctuance, Friable, Induration, Rash, Scarring, Dry/Scaly, Maceration, Atrophie Blanche, Cyanosis, Ecchymosis, Hemosiderin Staining, Mottled, Pallor, Rubor, Erythema. The periwound has tenderness on palpation. Wound #4 status is Open. Original cause of wound was Gradually Appeared. The wound is located on the Right,Distal Lower Leg. The wound measures 0.4cm length x 0.2cm width x 0.1cm depth; 0.063cm^2 area and 0.006cm^3 volume. The wound is limited to skin breakdown. There is no tunneling or undermining noted. There is a large amount of serous drainage noted. The wound margin is flat and intact. There is medium (34-66%) red, pink granulation within the wound bed. There is a medium (34-66%) amount of necrotic tissue within the wound bed including Adherent Slough. The periwound skin appearance exhibited: Localized Edema, Moist. The periwound skin appearance did not exhibit: Callus, Crepitus,  Excoriation, Fluctuance, Friable, Induration, Rash, Scarring, Dry/Scaly, Maceration, Atrophie Blanche, Cyanosis, Ecchymosis, Hemosiderin Staining, Mottled, Pallor, Rubor, Erythema. Periwound temperature was noted as No Abnormality. The periwound has tenderness on palpation. Assessment Active Problems ICD-10 E11.622 - Type 2 diabetes mellitus with other skin ulcer E66.01 - Morbid (severe) obesity due to excess calories [name], [name] ([id number]) L97.212 - Non-pressure chronic ulcer of right calf with fat layer exposed I89.0 - Lymphedema, not elsewhere classified I50.40 - Unspecified combined systolic (congestive) and diastolic (congestive) heart failure F17.218 - Nicotine dependence, cigarettes, with other nicotine-induced disorders I83.012 - 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 Plan Wound Cleansing: Wound #2 Right,Lateral,Superior Lower Leg: Cleanse wound with mild  soap and water May Shower, gently pat wound dry prior to applying new dressing. Wound #3 Right,Medial Lower Leg: Cleanse wound with mild soap and water May Shower, gently pat wound dry prior to applying new dressing. Wound #4 Right,Distal Lower Leg: Cleanse wound with mild soap and water May Shower, gently pat wound dry prior to applying new dressing. Anesthetic: Wound #2 Right,Lateral,Superior Lower Leg: Topical Lidocaine 4% cream applied to wound bed prior to debridement Wound #3 Right,Medial Lower Leg: Topical Lidocaine 4% cream applied to wound bed prior to debridement Wound #4 Right,Distal Lower Leg: Topical Lidocaine 4% cream applied to wound bed prior to debridement Primary Wound Dressing: Wound #2 Right,Lateral,Superior Lower Leg: Prisma Ag Wound #3 Right,Medial Lower Leg: Prisma Ag Wound #4 Right,Distal Lower Leg: Prisma Ag Secondary Dressing: Wound #2 Right,Lateral,Superior Lower Leg: Non-adherent pad - telfa island Wound #3 Right,Medial Lower Leg: Non-adherent pad - telfa  island Wound #4 Right,Distal Lower Leg: Non-adherent pad - telfa island Dressing Change Frequency: Wound #2 Right,Lateral,Superior Lower Leg: Change dressing every other day. Wound #3 Right,Medial Lower Leg: Change dressing every other day. Wound #4 Right,Distal Lower Leg: [name], [name]. ([id number]) Change dressing every other day. Follow-up Appointments: Wound #2 Right,Lateral,Superior Lower Leg: Return Appointment in 1 week. Wound #3 Right,Medial Lower Leg: Return Appointment in 1 week. Wound #4 Right,Distal Lower Leg: Return Appointment in 1 week. Edema Control: Wound #2 Right,Lateral,Superior Lower Leg: Patient to wear own compression stockings Elevate legs to the level of the heart and pump ankles as often as possible Wound #3 Right,Medial Lower Leg: Patient to wear own compression stockings Elevate legs to the level of the heart and pump ankles as often as possible Wound #4 Right,Distal Lower Leg: Patient to wear own compression stockings Elevate legs to the level of the heart and pump ankles as often as possible Local care will change to packing of silver collagen which is Prisma AG and using his compression stockings. Elevation and exercise and being compliant has been discussed with him and he agrees. I will also asked him to be very careful about his salt intake and watch the labels on food products. Come back and see me next week. Electronic Signature(s) Signed: 04/01/2015 4:00:11 PM By: Christin Fudge MD, FACS Previous Signature: 04/01/2015 8:46:30 AM Version By: Christin Fudge MD, FACS Entered By: Christin Fudge on 04/01/2015 16:00:11 [name] ([id number]) -------------------------------------------------------------------------------- SuperBill Details Patient Name: [name] Date of Service: 04/01/2015 Medical Record Number: [id number] Patient Account Number: [id number] Date of Birth/Sex: 09-25-1965 (49 y.o. Male) Treating RN: Ahmed Prima Primary Care Physician: [name], [place] Other Clinician: Referring Physician: [name], MELINDA Treating Physician/Extender: Frann Rider in Treatment: 12 Diagnosis Coding ICD-10 Codes Code Description E11.622 Type 2 diabetes mellitus with other skin ulcer E66.01 Morbid (severe) obesity due to excess calories L97.212 Non-pressure chronic ulcer of right calf with fat layer exposed I89.0 Lymphedema, not elsewhere classified I50.40 Unspecified combined systolic (congestive) and diastolic (congestive) heart failure F17.218 Nicotine dependence, cigarettes, with other nicotine-induced disorders 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 Facility Procedures CPT4 Code: [id number] Description: 99214 - WOUND CARE VISIT-LEV 4 EST PT Modifier: Quantity: 1 Physician Procedures CPT4 Code: [id number] Description: 99213 - WC PHYS LEVEL 3 - EST PT ICD-10 Description Diagnosis E11.622 Type 2 diabetes mellitus with other skin ulcer E66.01 Morbid (severe) obesity due to excess calories I89.0 Lymphedema, not elsewhere classified 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

## 2015-04-03 NOTE — Progress Notes (Addendum)
YANIXAN, MELLINGER ([id number]) Visit Report for 04/01/2015 Arrival Information Details Patient Name: [name]. Date of Service: 04/01/2015 8:00 AM Medical Record Number: [id number] Patient Account Number: [id number] Date of Birth/Sex: December 08, 1965 (49 y.o. Male) Treating RN: Ahmed Prima Primary Care Physician: [name], [place] Other Clinician: Referring Physician: [name], [place] Treating Physician/Extender: Frann Rider in Treatment: 12 Visit Information History Since Last Visit All ordered tests and consults were completed: No Patient Arrived: Ambulatory Added or deleted any medications: No Arrival Time: 08:12 Any new allergies or adverse reactions: No Accompanied By: self Had a fall or experienced change in No Transfer Assistance: None activities of daily living that may affect Patient Identification Verified: Yes risk of falls: Secondary Verification Process Yes Signs or symptoms of abuse/neglect since last No Completed: visito Patient Requires Transmission-Based No Hospitalized since last visit: No Precautions: Pain Present Now: No Patient Has Alerts: Yes Patient Alerts: DM II Electronic Signature(s) Signed: 04/02/2015 5:37:49 PM By: Alric Quan Entered By: Alric Quan on 04/01/2015 08:12:17 [name] ([id number]) -------------------------------------------------------------------------------- Clinic Level of Care Assessment Details Patient Name: [name] Date of Service: 04/01/2015 8:00 AM Medical Record Number: [id number] Patient Account Number: [id number] Date of Birth/Sex: January 26, 1966 (49 y.o. Male) Treating RN: Ahmed Prima Primary Care Physician: [name], [place] Other Clinician: Referring Physician: [name], MELINDA Treating Physician/Extender: Frann Rider in Treatment: 12 Clinic Level of Care Assessment Items TOOL 4 Quantity Score '[]'  - Use when only an EandM is performed on FOLLOW-UP visit 0 ASSESSMENTS - Nursing  Assessment / Reassessment '[]'  - Reassessment of Co-morbidities (includes updates in patient status) 0 X - Reassessment of Adherence to Treatment Plan 1 5 ASSESSMENTS - Wound and Skin Assessment / Reassessment '[]'  - Simple Wound Assessment / Reassessment - one wound 0 X - Complex Wound Assessment / Reassessment - multiple wounds 3 5 '[]'  - Dermatologic / Skin Assessment (not related to wound area) 0 ASSESSMENTS - Focused Assessment X - Circumferential Edema Measurements - multi extremities 1 5 '[]'  - Nutritional Assessment / Counseling / Intervention 0 '[]'  - Lower Extremity Assessment (monofilament, tuning fork, pulses) 0 '[]'  - Peripheral Arterial Disease Assessment (using hand held doppler) 0 ASSESSMENTS - Ostomy and/or Continence Assessment and Care '[]'  - Incontinence Assessment and Management 0 '[]'  - Ostomy Care Assessment and Management (repouching, etc.) 0 PROCESS - Coordination of Care X - Simple Patient / Family Education for ongoing care 1 15 '[]'  - Complex (extensive) Patient / Family Education for ongoing care 0 '[]'  - Staff obtains Programmer, systems, Records, Test Results / Process Orders 0 '[]'  - Staff telephones HHA, Nursing Homes / Clarify orders / etc 0 '[]'  - Routine Transfer to another Facility (non-emergent condition) 0 [name], [name] ([id number]) '[]'  - Routine Hospital Admission (non-emergent condition) 0 '[]'  - New Admissions / Biomedical engineer / Ordering NPWT, Apligraf, etc. 0 '[]'  - Emergency Hospital Admission (emergent condition) 0 X - Simple Discharge Coordination 1 10 '[]'  - Complex (extensive) Discharge Coordination 0 PROCESS - Special Needs '[]'  - Pediatric / Minor Patient Management 0 '[]'  - Isolation Patient Management 0 '[]'  - Hearing / Language / Visual special needs 0 '[]'  - Assessment of Community assistance (transportation, D/C planning, etc.) 0 '[]'  - Additional assistance / Altered mentation 0 '[]'  - Support Surface(s) Assessment (bed, cushion, seat, etc.) 0 INTERVENTIONS - Wound  Cleansing / Measurement '[]'  - Simple Wound Cleansing - one wound 0 X - Complex Wound Cleansing - multiple wounds 3 5 X - Wound Imaging (photographs - any number of wounds)  1 5 '[]'  - Wound Tracing (instead of photographs) 0 '[]'  - Simple Wound Measurement - one wound 0 X - Complex Wound Measurement - multiple wounds 3 5 INTERVENTIONS - Wound Dressings X - Small Wound Dressing one or multiple wounds 3 10 '[]'  - Medium Wound Dressing one or multiple wounds 0 '[]'  - Large Wound Dressing one or multiple wounds 0 '[]'  - Application of Medications - topical 0 '[]'  - Application of Medications - injection 0 INTERVENTIONS - Miscellaneous '[]'  - External ear exam 0 [name], [name]. ([id number]) '[]'  - Specimen Collection (cultures, biopsies, blood, body fluids, etc.) 0 '[]'  - Specimen(s) / Culture(s) sent or taken to Lab for analysis 0 '[]'  - Patient Transfer (multiple staff / Harrel Lemon Lift / Similar devices) 0 '[]'  - Simple Staple / Suture removal (25 or less) 0 '[]'  - Complex Staple / Suture removal (26 or more) 0 '[]'  - Hypo / Hyperglycemic Management (close monitor of Blood Glucose) 0 '[]'  - Ankle / Brachial Index (ABI) - do not check if billed separately 0 X - Vital Signs 1 5 Has the patient been seen at the hospital within the last three years: Yes Total Score: 120 Level Of Care: New/Established - Level 4 Electronic Signature(s) Signed: 04/02/2015 5:37:49 PM By: Alric Quan Entered By: Alric Quan on 04/01/2015 09:12:39 [name] ([id number]) -------------------------------------------------------------------------------- Complex / Palliative Patient Assessment Details Patient Name: [name] Date of Service: 04/01/2015 8:00 AM Medical Record Number: [id number] Patient Account Number: [id number] Date of Birth/Sex: Mar 25, 1965 (50 y.o. Male) Treating RN: Ahmed Prima Primary Care Physician: [name], [employer] Other Clinician: Referring Physician: [name], MELINDA Treating Physician/Extender:  Frann Rider in Treatment: 12 Palliative Management Criteria Complex Wound Management Criteria Patient is unable to adhere to an advanced wound management plan. o Patient Care Contract has been implemented. Patient demonstrates continued inability to adhere to an advanced wound management plan, the patient has been presented with the option of continuing the advanced plan or implementing the Complex Wound Management Plan. o Patient or healthcare surrogate must agree to Complex Wound Management Plan. Care Approach Wound Care Plan: Complex Wound Management Electronic Signature(s) Signed: 04/04/2015 5:40:39 PM By: Alric Quan Signed: 04/05/2015 7:51:48 AM By: Christin Fudge MD, FACS Entered By: Alric Quan on 04/04/2015 16:51:33 [name] ([id number]) -------------------------------------------------------------------------------- Encounter Discharge Information Details Patient Name: [name] Date of Service: 04/01/2015 8:00 AM Medical Record Number: [id number] Patient Account Number: [id number] Date of Birth/Sex: [birth year] (49 y.o. Male) Treating RN: Ahmed Prima Primary Care Physician: [name], [place] Other Clinician: Referring Physician: [name], [place] Treating Physician/Extender: Frann Rider in Treatment: 12 Encounter Discharge Information Items Discharge Pain Level: 0 Discharge Condition: Stable Ambulatory Status: Ambulatory Discharge Destination: Home Transportation: Private Auto Accompanied By: self Schedule Follow-up Appointment: Yes Medication Reconciliation completed and provided to Patient/Care Yes Kerissa Coia: Provided on Clinical Summary of Care: 04/01/2015 Form Type Recipient Paper Patient RT Electronic Signature(s) Signed: 04/01/2015 8:56:57 AM By: Ruthine Dose Entered By: Ruthine Dose on 04/01/2015 08:56:57 [name]  ([id number]) -------------------------------------------------------------------------------- Lower Extremity Assessment Details Patient Name: [name] Date of Service: 04/01/2015 8:00 AM Medical Record Number: [id number] Patient Account Number: [id number] Date of Birth/Sex: [birth year] (49 y.o. Male) Treating RN: Ahmed Prima Primary Care Physician: [name], [place] Other Clinician: Referring Physician: [name], MELINDA Treating Physician/Extender: Frann Rider in Treatment: 12 Edema Assessment Assessed: [Left: No] [Right: No] E[Left: dema] [Right: :] Calf Left: Right: Point of Measurement: cm From Medial Instep cm 47 cm Ankle Left: Right: Point of Measurement:  cm From Medial Instep cm 31 cm Vascular Assessment Pulses: Posterior Tibial Dorsalis Pedis Palpable: [Right:Yes] Extremity colors, hair growth, and conditions: Extremity Color: [Right:Hyperpigmented] Temperature of Extremity: [Right:Warm] Capillary Refill: [Right:< 3 seconds] Electronic Signature(s) Signed: 04/02/2015 5:37:49 PM By: Alric Quan Entered By: Alric Quan on 04/01/2015 08:17:07 [name] ([id number]) -------------------------------------------------------------------------------- Multi Wound Chart Details Patient Name: [name] Date of Service: 04/01/2015 8:00 AM Medical Record Number: [id number] Patient Account Number: [id number] Date of Birth/Sex: 10-30-1965 (51 y.o. Male) Treating RN: Ahmed Prima Primary Care Physician: [name], [place] Other Clinician: Referring Physician: [name], MELINDA Treating Physician/Extender: Frann Rider in Treatment: 12 Vital Signs Height(in): 71 Pulse(bpm): 83 Weight(lbs): 360 Blood Pressure 161/97 (mmHg): Body Mass Index(BMI): 50 Temperature(F): 98.2 Respiratory Rate 20 (breaths/min): Photos: [2:No Photos] [3:No Photos] [4:No Photos] Wound Location: [2:Right Lower Leg - Lateral, Right Lower Leg - Medial Right Lower  Leg - Distal Superior] Wounding Event: [2:Gradually Appeared] [3:Gradually Appeared] [4:Gradually Appeared] Primary Etiology: [2:Venous Leg Ulcer] [3:Venous Leg Ulcer] [4:Venous Leg Ulcer] Comorbid History: [2:Lymphedema, Congestive Lymphedema, Congestive Lymphedema, Congestive Heart Failure, Hypertension, Type II Diabetes, Gout] [3:Heart Failure, Hypertension, Type II Diabetes, Gout] [4:Heart Failure, Hypertension, Type II Diabetes, Gout] Date Acquired: [2:12/13/2014] [3:12/13/2014] [4:12/13/2014] Weeks of Treatment: [2:12] [3:12] [4:12] Wound Status: [2:Open] [3:Open] [4:Open] Measurements L x W x D 0.3x0.3x1.4 [3:0.1x0.1x0.3] [4:0x0x0] (cm) Area (cm) : [2:0.071] [3:0.008] [4:0] Volume (cm) : [2:0.099] [3:0.002] [4:0] % Reduction in Area: [2:91.00%] [3:99.60%] [4:100.00%] % Reduction in Volume: 36.90% [3:99.50%] [4:100.00%] Classification: [2:Full Thickness Without Exposed Support Structures] [3:Full Thickness Without Exposed Support Structures] [4:Full Thickness Without Exposed Support Structures] HBO Classification: [2:Grade 1] [3:Grade 1] [4:Grade 1] Exudate Amount: [2:Large] [3:Large] [4:Large] Exudate Type: [2:Purulent] [3:Serous] [4:Serous] Exudate Color: [2:yellow, brown, green] [3:amber] [4:amber] Wound Margin: [2:Flat and Intact] [3:Flat and Intact] [4:Flat and Intact] Granulation Amount: [2:Large (67-100%)] [3:Medium (34-66%)] [4:Medium (34-66%)] Granulation Quality: [2:Pink] [3:Red, Pink] [4:Red, Pink] Necrotic Amount: [2:Small (1-33%)] [3:Medium (34-66%)] [4:Medium (34-66%)] Exposed Structures: [name], [name] ([id number]) Fascia: No Fascia: No Fascia: No Fat: No Fat: No Fat: No Tendon: No Tendon: No Tendon: No Muscle: No Muscle: No Muscle: No Joint: No Joint: No Joint: No Bone: No Bone: No Bone: No Limited to Skin Limited to Skin Limited to Skin Breakdown Breakdown Breakdown Epithelialization: None None None Periwound Skin Texture: Edema: Yes Edema:  Yes Edema: Yes Excoriation: No Excoriation: No Induration: No Induration: No Callus: No Callus: No Crepitus: No Crepitus: No Fluctuance: No Fluctuance: No Friable: No Friable: No Rash: No Rash: No Scarring: No Scarring: No Periwound Skin Moist: Yes Moist: Yes Moist: Yes Moisture: Maceration: No Maceration: No Dry/Scaly: No Dry/Scaly: No Periwound Skin Color: No Abnormalities Noted Atrophie Blanche: No Atrophie Blanche: No Cyanosis: No Cyanosis: No Ecchymosis: No Ecchymosis: No Erythema: No Erythema: No Hemosiderin Staining: No Hemosiderin Staining: No Mottled: No Mottled: No Pallor: No Pallor: No Rubor: No Rubor: No Temperature: No Abnormality N/A No Abnormality Tenderness on Yes Yes Yes Palpation: Wound Preparation: Ulcer Cleansing: Ulcer Cleansing: Ulcer Cleansing: Rinsed/Irrigated with Rinsed/Irrigated with Rinsed/Irrigated with Saline Saline Saline Topical Anesthetic Topical Anesthetic Topical Anesthetic Applied: Other: lidocaine Applied: Other: lidocaine Applied: Other: lidocaine 4% 4% 4% Treatment Notes Electronic Signature(s) Signed: 04/02/2015 5:37:49 PM By: Alric Quan Entered By: Alric Quan on 04/01/2015 08:23:51 [name], [name] ([id number]) -------------------------------------------------------------------------------- [name] Details Patient Name: [name] Date of Service: 04/01/2015 8:00 AM Medical Record Number: [id number] Patient Account Number: [id number] Date of Birth/Sex: 1965-09-07 (49 y.o. Male) Treating RN: Ahmed Prima Primary Care Physician:  [name], MELINDA Other Clinician: Referring Physician: [name], MELINDA Treating Physician/Extender: Frann Rider in Treatment: 12 Active Inactive Abuse / Safety / Falls / Self Care Management Nursing Diagnoses: Potential for falls Goals: Patient will remain injury free Date Initiated: 01/01/2015 Goal Status: Active Patient/caregiver  will verbalize/demonstrate measures taken to prevent injury and/or falls Date Initiated: 01/01/2015 Goal Status: Active Interventions: Assess fall risk on admission and as needed Notes: Nutrition Nursing Diagnoses: Imbalanced nutrition Goals: Patient/caregiver agrees to and verbalizes understanding of need to use nutritional supplements and/or vitamins as prescribed Date Initiated: 01/01/2015 Goal Status: Active Patient/caregiver verbalizes understanding of need to maintain therapeutic glucose control per primary care physician Date Initiated: 01/01/2015 Goal Status: Active Patient/caregiver will maintain therapeutic glucose control Date Initiated: 01/01/2015 Goal Status: Active Interventions: [name], [name] ([id number]) Provide education on elevated blood sugars and impact on wound healing Provide education on nutrition Treatment Activities: Education provided on Nutrition : 01/08/2015 Notes: Orientation to the Wound Care Program Nursing Diagnoses: Knowledge deficit related to the wound healing center program Goals: Patient/caregiver will verbalize understanding of the [facility] Date Initiated: 01/01/2015 Goal Status: Active Interventions: Provide education on orientation to the wound center Notes: Pain, Acute or Chronic Nursing Diagnoses: Pain, acute or chronic: actual or potential Goals: Patient will verbalize adequate pain control and receive pain control interventions during procedures as needed Date Initiated: 01/01/2015 Goal Status: Active Patient/caregiver will verbalize adequate pain control between visits Date Initiated: 01/01/2015 Goal Status: Active Patient/caregiver will verbalize comfort level met Date Initiated: 01/01/2015 Goal Status: Active Interventions: Assess comfort goal upon admission Complete pain assessment as per visit requirements Notes: Wound/Skin Impairment [name], [name] ([id number]) Nursing Diagnoses: Impaired  tissue integrity Knowledge deficit related to smoking impact on wound healing Knowledge deficit related to ulceration/compromised skin integrity Goals: Patient will demonstrate a reduced rate of smoking or cessation of smoking Date Initiated: 01/01/2015 Goal Status: Active Patient/caregiver will verbalize understanding of skin care regimen Date Initiated: 01/01/2015 Goal Status: Active Ulcer/skin breakdown will have a volume reduction of 30% by week 4 Date Initiated: 01/01/2015 Goal Status: Active Ulcer/skin breakdown will have a volume reduction of 50% by week 8 Date Initiated: 01/01/2015 Goal Status: Active Ulcer/skin breakdown will have a volume reduction of 80% by week 12 Date Initiated: 01/01/2015 Goal Status: Active Interventions: Assess patient/caregiver ability to obtain necessary supplies Assess patient/caregiver ability to perform ulcer/skin care regimen upon admission and as needed Notes: Electronic Signature(s) Signed: 04/02/2015 5:37:49 PM By: Alric Quan Entered By: Alric Quan on 04/01/2015 08:39:16 [name] ([id number]) -------------------------------------------------------------------------------- Pain Assessment Details Patient Name: [name] Date of Service: 04/01/2015 8:00 AM Medical Record Number: [id number] Patient Account Number: [id number] Date of Birth/Sex: 1965/12/16 (50 y.o. Male) Treating RN: Ahmed Prima Primary Care Physician: [name], [place] Other Clinician: Referring Physician: [name], [place] Treating Physician/Extender: Frann Rider in Treatment: 12 Active Problems Location of Pain Severity and Description of Pain Patient Has Paino No Site Locations Pain Management and Medication Current Pain Management: Electronic Signature(s) Signed: 04/02/2015 5:37:49 PM By: Alric Quan Entered By: Alric Quan on 04/01/2015 08:12:22 [name]  ([id number]) -------------------------------------------------------------------------------- Patient/Caregiver Education Details Patient Name: [name] Date of Service: 04/01/2015 8:00 AM Medical Record Number: [id number] Patient Account Number: [id number] Date of Birth/Gender: 1965/08/13 (50 y.o. Male) Treating RN: Ahmed Prima Primary Care Physician: [name], [name] Other Clinician: Referring Physician: [name], MELINDA Treating Physician/Extender: Frann Rider in Treatment: 12 Education Assessment Education Provided To: Patient Education Topics Provided Wound/Skin Impairment: Handouts: Other: change  dressing as ordered Methods: Demonstration, Explain/Verbal Responses: State content correctly Electronic Signature(s) Signed: 04/02/2015 5:37:49 PM By: Alric Quan Entered By: Alric Quan on 04/01/2015 08:29:49 [name] ([id number]) -------------------------------------------------------------------------------- Wound Assessment Details Patient Name: [name] Date of Service: 04/01/2015 8:00 AM Medical Record Number: [id number] Patient Account Number: [id number] Date of Birth/Sex: 02-24-1965 (50 y.o. Male) Treating RN: Ahmed Prima Primary Care Physician: [name], [place] Other Clinician: Referring Physician: [name], MELINDA Treating Physician/Extender: Frann Rider in Treatment: 12 Wound Status Wound Number: 2 Primary Venous Leg Ulcer Etiology: Wound Location: Right Lower Leg - Lateral, Superior Wound Open Status: Wounding Event: Gradually Appeared Comorbid Lymphedema, Congestive Heart Date Acquired: 12/13/2014 History: Failure, Hypertension, Type II Weeks Of Treatment: 12 Diabetes, Gout Clustered Wound: No Photos Photo Uploaded By: Alric Quan on 04/01/2015 11:15:23 Wound Measurements Length: (cm) 0.3 Width: (cm) 0.3 Depth: (cm) 1.4 Area: (cm) 0.071 Volume: (cm) 0.099 % Reduction in Area: 91% % Reduction in  Volume: 36.9% Epithelialization: None Tunneling: No Undermining: No Wound Description Full Thickness Without Classification: Exposed Support Structures Diabetic Severity Grade 1 (Wagner): Wound Margin: Flat and Intact Exudate Amount: Large Exudate Type: Purulent Exudate Color: yellow, brown, green Foul Odor After Cleansing: No Wound Bed Granulation Amount: Large (67-100%) Exposed Structure [name], [name] ([id number]) Granulation Quality: Pink Fascia Exposed: No Necrotic Amount: Small (1-33%) Fat Layer Exposed: No Necrotic Quality: Adherent Slough Tendon Exposed: No Muscle Exposed: No Joint Exposed: No Bone Exposed: No Limited to Skin Breakdown Periwound Skin Texture Texture Color No Abnormalities Noted: No No Abnormalities Noted: No Localized Edema: Yes Temperature / Pain Moisture Temperature: No Abnormality No Abnormalities Noted: No Tenderness on Palpation: Yes Moist: Yes Wound Preparation Ulcer Cleansing: Rinsed/Irrigated with Saline Topical Anesthetic Applied: Other: lidocaine 4%, Treatment Notes Wound #2 (Right, Lateral, Superior Lower Leg) 1. Cleansed with: Clean wound with Normal Saline 2. Anesthetic Topical Lidocaine 4% cream to wound bed prior to debridement 4. Dressing Applied: Prisma Ag 5. Secondary [place] Signature(s) Signed: 04/02/2015 5:37:49 PM By: Alric Quan Entered By: Alric Quan on 04/01/2015 08:23:00 [name] ([id number]) -------------------------------------------------------------------------------- Wound Assessment Details Patient Name: [name] Date of Service: 04/01/2015 8:00 AM Medical Record Number: [id number] Patient Account Number: [id number] Date of Birth/Sex: [birth year] (49 y.o. Male) Treating RN: Ahmed Prima Primary Care Physician: [name], [place] Other Clinician: Referring Physician: [name], [place] Treating Physician/Extender: Frann Rider in  Treatment: 12 Wound Status Wound Number: 3 Primary Venous Leg Ulcer Etiology: Wound Location: Right Lower Leg - Medial Wound Open Wounding Event: Gradually Appeared Status: Date Acquired: 12/13/2014 Comorbid Lymphedema, Congestive Heart Weeks Of Treatment: 12 History: Failure, Hypertension, Type II Clustered Wound: No Diabetes, Gout Photos Photo Uploaded By: Alric Quan on 04/01/2015 11:15:38 Wound Measurements Length: (cm) 0.1 Width: (cm) 0.1 Depth: (cm) 0.3 Area: (cm) 0.008 Volume: (cm) 0.002 % Reduction in Area: 99.6% % Reduction in Volume: 99.5% Epithelialization: None Tunneling: No Undermining: No Wound Description Full Thickness Without Classification: Exposed Support Structures Diabetic Severity Grade 1 (Wagner): Wound Margin: Flat and Intact Exudate Amount: Large Exudate Type: Serous Exudate Color: amber Foul Odor After Cleansing: No Wound Bed Granulation Amount: Medium (34-66%) Exposed Structure [name], LIA. ([id number]) Granulation Quality: Red, Pink Fascia Exposed: No Necrotic Amount: Medium (34-66%) Fat Layer Exposed: No Necrotic Quality: Adherent Slough Tendon Exposed: No Muscle Exposed: No Joint Exposed: No Bone Exposed: No Limited to Skin Breakdown Periwound Skin Texture Texture Color No Abnormalities Noted: No No Abnormalities Noted: No Callus: No Atrophie Blanche: No Crepitus: No  Cyanosis: No Excoriation: No Ecchymosis: No Fluctuance: No Erythema: No Friable: No Hemosiderin Staining: No Induration: No Mottled: No Localized Edema: Yes Pallor: No Rash: No Rubor: No Scarring: No Temperature / Pain Moisture Tenderness on Palpation: Yes No Abnormalities Noted: No Dry / Scaly: No Maceration: No Moist: Yes Wound Preparation Ulcer Cleansing: Rinsed/Irrigated with Saline Topical Anesthetic Applied: Other: lidocaine 4%, Treatment Notes Wound #3 (Right, Medial Lower Leg) 1. Cleansed with: Clean wound with Normal  Saline 2. Anesthetic Topical Lidocaine 4% cream to wound bed prior to debridement 4. Dressing Applied: Prisma Ag 5. Secondary [place] Signature(s) Signed: 04/02/2015 5:37:49 PM By: Alric Quan Entered By: Alric Quan on 04/01/2015 08:23:25 [name] ([id number]) -------------------------------------------------------------------------------- Wound Assessment Details Patient Name: [name] Date of Service: 04/01/2015 8:00 AM Medical Record Number: [id number] Patient Account Number: [id number] Date of Birth/Sex: 10-26-1965 (49 y.o. Male) Treating RN: Ahmed Prima Primary Care Physician: [name], [place] Other Clinician: Referring Physician: [name], MELINDA Treating Physician/Extender: Frann Rider in Treatment: 12 Wound Status Wound Number: 4 Primary Venous Leg Ulcer Etiology: Wound Location: Right Lower Leg - Distal Wound Open Wounding Event: Gradually Appeared Status: Date Acquired: 12/13/2014 Comorbid Lymphedema, Congestive Heart Weeks Of Treatment: 12 History: Failure, Hypertension, Type II Clustered Wound: No Diabetes, Gout Photos Photo Uploaded By: Alric Quan on 04/01/2015 11:15:49 Wound Measurements Length: (cm) 0.4 Width: (cm) 0.2 Depth: (cm) 0.1 Area: (cm) 0.063 Volume: (cm) 0.006 % Reduction in Area: 95.9% % Reduction in Volume: 98% Epithelialization: None Tunneling: No Undermining: No Wound Description Full Thickness Without Classification: Exposed Support Structures Diabetic Severity Grade 1 ([place]): Wound Margin: Flat and Intact Exudate Amount: Large Exudate Type: Serous Exudate Color: amber Foul Odor After Cleansing: No Wound Bed Granulation Amount: Medium (34-66%) Exposed Structure [name], MAROHL. ([id number]) Granulation Quality: Red, Pink Fascia Exposed: No Necrotic Amount: Medium (34-66%) Fat Layer Exposed: No Necrotic Quality: Adherent Slough Tendon Exposed:  No Muscle Exposed: No Joint Exposed: No Bone Exposed: No Limited to Skin Breakdown Periwound Skin Texture Texture Color No Abnormalities Noted: No No Abnormalities Noted: No Callus: No Atrophie Blanche: No Crepitus: No Cyanosis: No Excoriation: No Ecchymosis: No Fluctuance: No Erythema: No Friable: No Hemosiderin Staining: No Induration: No Mottled: No Localized Edema: Yes Pallor: No Rash: No Rubor: No Scarring: No Temperature / Pain Moisture Temperature: No Abnormality No Abnormalities Noted: No Tenderness on Palpation: Yes Dry / Scaly: No Maceration: No Moist: Yes Wound Preparation Ulcer Cleansing: Rinsed/Irrigated with Saline Topical Anesthetic Applied: Other: lidocaine 4%, Treatment Notes Wound #4 (Right, Distal Lower Leg) 1. Cleansed with: Clean wound with Normal Saline 2. Anesthetic Topical Lidocaine 4% cream to wound bed prior to debridement 4. Dressing Applied: Prisma Ag 5. Secondary [place] Signature(s) Signed: 04/02/2015 5:37:49 PM By: Alric Quan Entered By: Alric Quan on 04/01/2015 08:39:05 [name] ([id number]) -------------------------------------------------------------------------------- [place] Details Patient Name: [name] Date of Service: 04/01/2015 8:00 AM Medical Record Number: [id number] Patient Account Number: [id number] Date of Birth/Sex: 1965-01-29 (49 y.o. Male) Treating RN: Ahmed Prima Primary Care Physician: [name], [place] Other Clinician: Referring Physician: [name], [place] Treating Physician/Extender: Frann Rider in Treatment: 12 Vital Signs Time Taken: 08:12 Temperature (F): 98.2 Height (in): 71 Pulse (bpm): 83 Weight (lbs): 360 Respiratory Rate (breaths/min): 20 Body Mass Index (BMI): 50.2 Blood Pressure (mmHg): 161/97 Reference Range: 80 - 120 mg / dl Electronic Signature(s) Signed: 04/02/2015 5:37:49 PM By: Alric Quan Entered By:  Alric Quan on 04/01/2015 08:14:12

## 2015-04-08 ENCOUNTER — Encounter: Payer: Managed Care, Other (non HMO) | Admitting: Surgery

## 2015-04-08 ENCOUNTER — Telehealth: Payer: Self-pay

## 2015-04-08 DIAGNOSIS — E11622 Type 2 diabetes mellitus with other skin ulcer: Secondary | ICD-10-CM | POA: Diagnosis not present

## 2015-04-08 NOTE — Telephone Encounter (Signed)
I called patient around 6:45 pm to address call below and also after-hours call that he was getting someone else's tramadol and taking three at a time and drinking alcohol; he will come in tomorrow morning at 8:30 am; I called to see if he is okay; he says that he was taking tramadol but is not taking that any more; he quit; I explained that he should NOT take someone else's pain pills; he should NEVER drink alcohol with pain medicine; illegal to take someone else's pain medicine; he then ordered food through the drive thru onions rings, some sort of chicken, with ranch; we ended our call ----------------------------------- [facility] staff -- please put patient on my schedule for 8:30 am March 14th; he is aware

## 2015-04-08 NOTE — Telephone Encounter (Signed)
Patient called on Friday, 04/05/15 to schedule his next appointment and was informed that he is being terminated from [facility] and Dr. Delight Ovens care due to non-compliance and habitually no showing for appointments. Patient stated that he had not received his certified letter as of 04/05/15. Patient was upset and stated that he cannot go to another provider because [name] is the provider that knows how to take care of him. I explained to [name] that there have been several conversations with him regarding his compliance, no shows, and payment of his bills but no change in his compliance. Patient stated that he has too many doctors' appts and cannot make it to all of his appts but desperately needs to continue to see [name] for his diabetes and several other medical issues. Patient stated that he was going to die and requested that [name] call him to address his medical concerns. I advised the patient that [name] was out of the office and would not return until Monday, 04/08/15. I offered to let him speak with another provider but patient stated that he would prefer to speak with [name].

## 2015-04-09 ENCOUNTER — Encounter: Payer: Self-pay | Admitting: Family Medicine

## 2015-04-09 ENCOUNTER — Telehealth: Payer: Self-pay

## 2015-04-09 ENCOUNTER — Ambulatory Visit (INDEPENDENT_AMBULATORY_CARE_PROVIDER_SITE_OTHER): Payer: Managed Care, Other (non HMO) | Admitting: Family Medicine

## 2015-04-09 VITALS — BP 145/93 | HR 91 | Temp 99.3°F | Ht 69.0 in | Wt 390.0 lb

## 2015-04-09 DIAGNOSIS — J309 Allergic rhinitis, unspecified: Secondary | ICD-10-CM | POA: Insufficient documentation

## 2015-04-09 DIAGNOSIS — I10 Essential (primary) hypertension: Secondary | ICD-10-CM | POA: Diagnosis not present

## 2015-04-09 DIAGNOSIS — Z9119 Patient's noncompliance with other medical treatment and regimen: Secondary | ICD-10-CM | POA: Diagnosis not present

## 2015-04-09 DIAGNOSIS — R809 Proteinuria, unspecified: Secondary | ICD-10-CM

## 2015-04-09 DIAGNOSIS — E1165 Type 2 diabetes mellitus with hyperglycemia: Secondary | ICD-10-CM | POA: Diagnosis not present

## 2015-04-09 DIAGNOSIS — G4700 Insomnia, unspecified: Secondary | ICD-10-CM

## 2015-04-09 DIAGNOSIS — J3089 Other allergic rhinitis: Secondary | ICD-10-CM

## 2015-04-09 DIAGNOSIS — Z91199 Patient's noncompliance with other medical treatment and regimen due to unspecified reason: Secondary | ICD-10-CM | POA: Insufficient documentation

## 2015-04-09 DIAGNOSIS — IMO0001 Reserved for inherently not codable concepts without codable children: Secondary | ICD-10-CM

## 2015-04-09 MED ORDER — INSULIN GLARGINE 300 UNIT/ML ~~LOC~~ SOPN: 16.0000 [IU] | PEN_INJECTOR | Freq: Every day | SUBCUTANEOUS | Status: DC

## 2015-04-09 NOTE — Assessment & Plan Note (Signed)
I cautioned him in the strongest terms that it is dangerous to take someone else's pills, and very dangerous to take pain pills with alcohol, especially if taking for the purpose of falling asleep; I explained that people may fall asleep and never wake back up, unintentional overdoses can occur with pain pills or pain pills plus alcohol; I also explained that it is illegal to take medicine prescribed for someone else; I explained my concern and how worried I was for him; he does not sound like he was trying to take the tramadol to cause self-harm; he does not appear depressed; he voices no SI; I suggested melatonin instead for sleep, see AVS

## 2015-04-09 NOTE — Assessment & Plan Note (Signed)
Allergic to talc and baby powder; explained albuterol will do the opposite of his heart medicine; would rather he use plain claritin and avoid triggers than use albuterol if he doesn't have asthma

## 2015-04-09 NOTE — Assessment & Plan Note (Signed)
I stressed to him the importance of follow-up and compliance as he establishes care with his new provider next month; decrease Toujeo from 18 units to 16 units daily

## 2015-04-09 NOTE — Assessment & Plan Note (Signed)
He is not interested in seeing nutritionist or a referral to bariatric surgeon; he seems to be in complete denial about his weight; he says he's lost 30 pounds, but I clarified last summer's weight at 387 pounds and today he is 390 pounds; he still thinks he has lost 30 pounds and seems content with that

## 2015-04-09 NOTE — Progress Notes (Signed)
BP 145/93 mmHg  Pulse 91  Temp(Src) 99.3 F (37.4 C)  Ht 5\' 9"  (1.753 m)  Wt 390 lb (176.903 kg)  BMI 57.57 kg/m2   Subjective:    Patient ID: [name], male    DOB: 1965-02-10, 50 y.o.   MRN: [id number]  HPI: [name] is a 50 y.o. male  Chief Complaint  Patient presents with  . Follow-up    Per Dr. Delight Ovens request; he has an establish care appt at [facility] on 05/13/15  . Medication Refill    He'd like a refill on albuterol  . Referral    patient will call and schedule dm eye exam   Patient is here at my request for an urgent visit after we received notice from on-call nurse line; see typed report  He says he took the tramadol because something was hurting; the tramadol was not prescribed for him He says, "Remember when I told you I couldn't sleep at night?" When he was out of work, he would sleep all day long and then be up all night; he took two tramadol and then slept all the way until morning; he tried it a couple of other times too; he is now sleeping from 11 pm until 4 or 5 am I asked directly about alcohol; he says he had opened a beer and took a sip of the beer; had already had the tramadol and then fell asleep; did not finish the beer He called the on-call number because he was feeling depressed, then says not depressed, more like his stomach got anxious and woke up Sunday like that, all weekend; called the nurse's line before and she talked with him for 3-4 hours; he is not really depressed now; he says it's not depression  Depression screen [facility] 2/9 04/09/2015  Decreased Interest 0  Down, Depressed, Hopeless 0  PHQ - 2 Score 0   No thoughts of self-harm; he says he is not going to kill himself; no intention for taking the tramadol other than to fall asleep that night He knows people can die drinking and taking pain pills He does not think he has an addiction to pain pills  He has congestive heart failure; sees cardiologist every three months  He  has been discharged from our practice, and is seen today for urgent visit at my request because of concerns about alcohol and pain pills as noted above; he is going to see new NP at [place] next month  He says he has lost 30 pounds; that is since he came here   He says he needs a refill of albuterol; he says one inhaler lasts him a whole year; he does not have asthma; he has allergies; baby powder is a trigger; he is allergic to talc; that really causes him to react; chemical allergies, not trees and pollen  He has type 2 diabetes; when he takes the 18 units; 108 to 120 FSBS; he takes his shot in the afternoon and then has to drink a little OJ because he gets light-headed  Relevant past medical, surgical, family and social history reviewed and updated as indicated. Interim medical history since our last visit reviewed. Allergies and medications reviewed and updated.  Meds ordered this encounter  Medications  . Zinc 50 MG CAPS    Sig: Take 50 mg by mouth daily.  [name] Kitchen aspirin 81 MG chewable tablet    Sig: Chew 81 mg by mouth daily.  [name] Kitchen spironolactone (ALDACTONE) 25 MG  tablet    Sig: Take 25 mg by mouth daily.  . Insulin Glargine (TOUJEO SOLOSTAR) 300 UNIT/ML SOPN    Sig: Inject 16 Units into the skin daily.    Dispense:  1 pen    Refill:  0    No Rx given today   Review of Systems Per HPI unless specifically indicated above     Objective:    BP 145/93 mmHg  Pulse 91  Temp(Src) 99.3 F (37.4 C)  Ht 5\' 9"  (1.753 m)  Wt 390 lb (176.903 kg)  BMI 57.57 kg/m2  Wt Readings from Last 3 Encounters:  04/09/15 390 lb (176.903 kg)  01/17/15 392 lb (177.81 kg)  08/07/14 387 lb (175.542 kg)    Physical Exam  Constitutional: He appears well-developed and well-nourished.  Morbidly obese  Cardiovascular: Normal rate and regular rhythm.   Pulmonary/Chest: Effort normal and breath sounds normal.  Musculoskeletal:  Compression stockings  Neurological: He is alert. He displays no tremor.   Psychiatric: His speech is normal. His mood appears not anxious. He is not agitated and not slowed. Cognition and memory are not impaired. He does not exhibit a depressed mood.      Assessment & Plan:   Problem List Items Addressed This Visit      Cardiovascular and Mediastinum   Benign hypertension    Not controlled today; I urged him to contact his cardiologist about his pressure today      Relevant Medications   aspirin 81 MG chewable tablet   spironolactone (ALDACTONE) 25 MG tablet     Respiratory   Allergic rhinitis    Allergic to talc and baby powder; explained albuterol will do the opposite of his heart medicine; would rather he use plain claritin and avoid triggers than use albuterol if he doesn't have asthma        Endocrine   Uncontrolled type 2 diabetes mellitus with proteinuria or albuminuria    I stressed to him the importance of follow-up and compliance as he establishes care with his new provider next month; decrease Toujeo from 18 units to 16 units daily      Relevant Medications   aspirin 81 MG chewable tablet   Insulin Glargine (TOUJEO SOLOSTAR) 300 UNIT/ML SOPN     Other   Morbid obesity ([place])    He is not interested in seeing nutritionist or a referral to bariatric surgeon; he seems to be in complete denial about his weight; he says he's lost 30 pounds, but I clarified last summer's weight at 387 pounds and today he is 390 pounds; he still thinks he has lost 30 pounds and seems content with that      Relevant Medications   Insulin Glargine (TOUJEO SOLOSTAR) 300 UNIT/ML SOPN   Insomnia - Primary    I cautioned him in the strongest terms that it is dangerous to take someone else's pills, and very dangerous to take pain pills with alcohol, especially if taking for the purpose of falling asleep; I explained that people may fall asleep and never wake back up, unintentional overdoses can occur with pain pills or pain pills plus alcohol; I also explained that it  is illegal to take medicine prescribed for someone else; I explained my concern and how worried I was for him; he does not sound like he was trying to take the tramadol to cause self-harm; he does not appear depressed; he voices no SI; I suggested melatonin instead for sleep, see AVS  Noncompliance    Patient has been discharged from [facility] / Dr. Delight Ovens care; letter was handed to him here today by staff member; he has an upcoming appointment already scheduled at nearby clinic          Follow up plan: Return [place].  An after-visit summary was printed by [place] [place] and will be mailed to him; the patient left the office without stopping by the check-out window.  Please see the patient instructions which may contain other information and recommendations beyond what is mentioned above in the assessment and plan.

## 2015-04-09 NOTE — Assessment & Plan Note (Signed)
Not controlled today; I urged him to contact his cardiologist about his pressure today

## 2015-04-09 NOTE — Patient Instructions (Addendum)
It is very dangerous and illegal to take medicine prescribed for someone else Mixing pain medicine and alcohol can result in unintentional overdose (death) Never use alcohol for sleep If you need something else to help regulate your sleep, try melatonin over-the-counter, and take 3 mg every night for 3 weeks in a row at the exact same time of night and that may help get your sleep cycle back on track Keep the appointment with Fredia Sorrow, NP Try plain Mucinex for cough Okay to take plain Claritin for allergies; avoid decongestants Call you cardiologist about your blood pressure today so she can help you bring that down Try to follow the DASH guidelines (DASH stands for Dietary Approaches to Stop Hypertension) Try to limit the sodium in your diet.  Ideally, consume less than 1.5 grams (less than 1,500mg ) per day. Do not add salt when cooking or at the table.  Check the sodium amount on labels when shopping, and choose items lower in sodium when given a choice. Avoid or limit foods that already contain a lot of sodium. Eat a diet rich in fruits and vegetables and whole grains. Decrease your long-acting insulin to sixteen units once a day  Insomnia Insomnia is a sleep disorder that makes it difficult to fall asleep or to stay asleep. Insomnia can cause tiredness (fatigue), low energy, difficulty concentrating, mood swings, and poor performance at work or school.  There are three different ways to classify insomnia:  Difficulty falling asleep.  Difficulty staying asleep.  Waking up too early in the morning. Any type of insomnia can be long-term (chronic) or short-term (acute). Both are common. Short-term insomnia usually lasts for three months or less. Chronic insomnia occurs at least three times a week for longer than three months. CAUSES  Insomnia may be caused by another condition, situation, or substance, such as:  Anxiety.  Certain medicines.  Gastroesophageal reflux disease (GERD) or  other gastrointestinal conditions.  Asthma or other breathing conditions.  Restless legs syndrome, sleep apnea, or other sleep disorders.  Chronic pain.  Menopause. This may include hot flashes.  Stroke.  Abuse of alcohol, tobacco, or illegal drugs.  Depression.  Caffeine.   Neurological disorders, such as Alzheimer disease.  An overactive thyroid (hyperthyroidism). The cause of insomnia may not be known. RISK FACTORS Risk factors for insomnia include:  Gender. Women are more commonly affected than men.  Age. Insomnia is more common as you get older.  Stress. This may involve your professional or personal life.  Income. Insomnia is more common in people with lower income.  Lack of exercise.   Irregular work schedule or night shifts.  Traveling between different time zones. SIGNS AND SYMPTOMS If you have insomnia, trouble falling asleep or trouble staying asleep is the main symptom. This may lead to other symptoms, such as:  Feeling fatigued.  Feeling nervous about going to sleep.  Not feeling rested in the morning.  Having trouble concentrating.  Feeling irritable, anxious, or depressed. TREATMENT  Treatment for insomnia depends on the cause. If your insomnia is caused by an underlying condition, treatment will focus on addressing the condition. Treatment may also include:   Medicines to help you sleep.  Counseling or therapy.  Lifestyle adjustments. HOME CARE INSTRUCTIONS   Take medicines only as directed by your health care provider.  Keep regular sleeping and waking hours. Avoid naps.  Keep a sleep diary to help you and your health care provider figure out what could be causing your insomnia. Include:  When you sleep.  When you wake up during the night.  How well you sleep.   How rested you feel the next day.  Any side effects of medicines you are taking.  What you eat and drink.   Make your bedroom a comfortable place where it is  easy to fall asleep:  Put up shades or special blackout curtains to block light from outside.  Use a white noise machine to block noise.  Keep the temperature cool.   Exercise regularly as directed by your health care provider. Avoid exercising right before bedtime.  Use relaxation techniques to manage stress. Ask your health care provider to suggest some techniques that may work well for you. These may include:  Breathing exercises.  Routines to release muscle tension.  Visualizing peaceful scenes.  Cut back on alcohol, caffeinated beverages, and cigarettes, especially close to bedtime. These can disrupt your sleep.  Do not overeat or eat spicy foods right before bedtime. This can lead to digestive discomfort that can make it hard for you to sleep.  Limit screen use before bedtime. This includes:  Watching TV.  Using your smartphone, tablet, and computer.  Stick to a routine. This can help you fall asleep faster. Try to do a quiet activity, brush your teeth, and go to bed at the same time each night.  Get out of bed if you are still awake after 15 minutes of trying to sleep. Keep the lights down, but try reading or doing a quiet activity. When you feel sleepy, go back to bed.  Make sure that you drive carefully. Avoid driving if you feel very sleepy.  Keep all follow-up appointments as directed by your health care provider. This is important. SEEK MEDICAL CARE IF:   You are tired throughout the day or have trouble in your daily routine due to sleepiness.  You continue to have sleep problems or your sleep problems get worse. SEEK IMMEDIATE MEDICAL CARE IF:   You have serious thoughts about hurting yourself or someone else.   This information is not intended to replace advice given to you by your health care provider. Make sure you discuss any questions you have with your health care provider.   Document Released: 01/10/2000 Document Revised: 10/03/2014 Document  Reviewed: 10/13/2013 Elsevier Interactive Patient Education 2016 [employer] Obesity Obesity is defined as having too much total body fat and a body mass index (BMI) of 30 or more. BMI is an estimate of body fat and is calculated from your height and weight. BMI is typically calculated by your health care provider during regular wellness visits. Obesity happens when you consume more calories than you can burn by exercising or performing daily physical tasks. Prolonged obesity can cause major illnesses or emergencies, such as:  Stroke.  Heart disease.  Diabetes.  Cancer.  Arthritis.  High blood pressure (hypertension).  High cholesterol.  Sleep apnea.  Erectile dysfunction.  Infertility problems. CAUSES   Regularly eating unhealthy foods.  Physical inactivity.  Certain disorders, such as an underactive thyroid (hypothyroidism), Cushing's syndrome, and polycystic ovarian syndrome.  Certain medicines, such as steroids, some depression medicines, and antipsychotics.  Genetics.  Lack of sleep. DIAGNOSIS A health care provider can diagnose obesity after calculating your BMI. Obesity will be diagnosed if your BMI is 30 or higher. There are other methods of measuring obesity levels. Some other methods include measuring your skinfold thickness, your waist circumference, and comparing your hip circumference to your waist circumference. TREATMENT  A healthy  treatment program includes some or all of the following:  Long-term dietary changes.  Exercise and physical activity.  Behavioral and lifestyle changes.  Medicine only under the supervision of your health care provider. Medicines may help, but only if they are used with diet and exercise programs. If your BMI is 40 or higher, your health care provider may recommend specialized surgery or programs to help with weight loss. An unhealthy treatment program includes:  Fasting.  Fad diets.  Supplements and drugs. These  choices do not succeed in long-term weight control. HOME CARE INSTRUCTIONS  Exercise and perform physical activity as directed by your health care provider. To increase physical activity, try the following:  Use stairs instead of elevators.  Park farther away from store entrances.  Garden, bike, or walk instead of watching television or using the computer.  Eat healthy, low-calorie foods and drinks on a regular basis. Eat more fruits and vegetables. Use low-calorie cookbooks or take healthy cooking classes.  Limit fast food, sweets, and processed snack foods.  Eat smaller portions.  Keep a daily journal of everything you eat. There are many free websites to help you with this. It may be helpful to measure your foods so you can determine if you are eating the correct portion sizes.  Avoid drinking alcohol. Drink more water and drinks without calories.  Take vitamins and supplements only as recommended by your health care provider.  Weight-loss support groups, Tax adviser, counselors, and stress reduction education can also be very helpful. SEEK IMMEDIATE MEDICAL CARE IF:  You have chest pain or tightness.  You have trouble breathing or feel short of breath.  You have weakness or leg numbness.  You feel confused or have trouble talking.  You have sudden changes in your vision.   This information is not intended to replace advice given to you by your health care provider. Make sure you discuss any questions you have with your health care provider.   Document Released: 02/20/2004 Document Revised: 02/02/2014 Document Reviewed: 02/18/2011 Elsevier Interactive Patient Education 2016 [place] DASH stands for "Dietary Approaches to Stop Hypertension." The DASH eating plan is a healthy eating plan that has been shown to reduce high blood pressure (hypertension). Additional health benefits may include reducing the risk of type 2 diabetes mellitus, heart  disease, and stroke. The DASH eating plan may also help with weight loss. WHAT DO I NEED TO KNOW ABOUT THE DASH EATING PLAN? For the DASH eating plan, you will follow these general guidelines:  Choose foods with a percent daily value for sodium of less than 5% (as listed on the food label).  Use salt-free seasonings or herbs instead of table salt or sea salt.  Check with your health care provider or pharmacist before using salt substitutes.  Eat lower-sodium products, often labeled as "lower sodium" or "no salt added."  Eat fresh foods.  Eat more vegetables, fruits, and low-fat dairy products.  Choose whole grains. Look for the word "whole" as the first word in the ingredient list.  Choose fish and skinless chicken or [place] more often than red meat. Limit fish, poultry, and meat to 6 oz (170 g) each day.  Limit sweets, desserts, sugars, and sugary drinks.  Choose heart-healthy fats.  Limit cheese to 1 oz (28 g) per day.  Eat more home-cooked food and less restaurant, buffet, and fast food.  Limit fried foods.  Cook foods using methods other than frying.  Limit canned vegetables. If you do  use them, rinse them well to decrease the sodium.  When eating at a restaurant, ask that your food be prepared with less salt, or no salt if possible. WHAT FOODS CAN I EAT? Seek help from a dietitian for individual calorie needs. Grains Whole grain or whole wheat bread. Brown rice. Whole grain or whole wheat pasta. Quinoa, bulgur, and whole grain cereals. Low-sodium cereals. Corn or whole wheat flour tortillas. Whole grain cornbread. Whole grain crackers. Low-sodium crackers. Vegetables Fresh or frozen vegetables (raw, steamed, roasted, or grilled). Low-sodium or reduced-sodium tomato and vegetable juices. Low-sodium or reduced-sodium tomato sauce and paste. Low-sodium or reduced-sodium canned vegetables.  Fruits All fresh, canned (in natural juice), or frozen fruits. Meat and Other  Protein Products Ground beef (85% or leaner), grass-fed beef, or beef trimmed of fat. Skinless chicken or [place]. Ground chicken or [place]. Pork trimmed of fat. All fish and seafood. Eggs. Dried beans, peas, or lentils. Unsalted nuts and seeds. Unsalted canned beans. Dairy Low-fat dairy products, such as skim or 1% milk, 2% or reduced-fat cheeses, low-fat ricotta or cottage cheese, or plain low-fat yogurt. Low-sodium or reduced-sodium cheeses. Fats and Oils Tub margarines without trans fats. Light or reduced-fat mayonnaise and salad dressings (reduced sodium). Avocado. Safflower, olive, or canola oils. Natural peanut or almond butter. Other Unsalted popcorn and pretzels. The items listed above may not be a complete list of recommended foods or beverages. Contact your dietitian for more options. WHAT FOODS ARE NOT RECOMMENDED? Grains White bread. White pasta. White rice. Refined cornbread. Bagels and croissants. Crackers that contain trans fat. Vegetables Creamed or fried vegetables. Vegetables in a cheese sauce. Regular canned vegetables. Regular canned tomato sauce and paste. Regular tomato and vegetable juices. Fruits Dried fruits. Canned fruit in light or heavy syrup. Fruit juice. Meat and Other Protein Products Fatty cuts of meat. Ribs, chicken wings, bacon, sausage, bologna, salami, chitterlings, fatback, hot dogs, bratwurst, and packaged luncheon meats. Salted nuts and seeds. Canned beans with salt. Dairy Whole or 2% milk, cream, half-and-half, and cream cheese. Whole-fat or sweetened yogurt. Full-fat cheeses or blue cheese. Nondairy creamers and whipped toppings. Processed cheese, cheese spreads, or cheese curds. Condiments Onion and garlic salt, seasoned salt, table salt, and sea salt. Canned and packaged gravies. Worcestershire sauce. Tartar sauce. Barbecue sauce. Teriyaki sauce. Soy sauce, including reduced sodium. Steak sauce. Fish sauce. Oyster sauce. Cocktail sauce. Horseradish.  Ketchup and mustard. Meat flavorings and tenderizers. Bouillon cubes. Hot sauce. Tabasco sauce. Marinades. Taco seasonings. Relishes. Fats and Oils Butter, stick margarine, lard, shortening, ghee, and bacon fat. Coconut, palm kernel, or palm oils. Regular salad dressings. Other Pickles and olives. Salted popcorn and pretzels. The items listed above may not be a complete list of foods and beverages to avoid. Contact your dietitian for more information. WHERE CAN I FIND MORE INFORMATION? National Heart, Lung, and Blood Institute: travelstabloid.com   This information is not intended to replace advice given to you by your health care provider. Make sure you discuss any questions you have with your health care provider.   Document Released: 01/01/2011 Document Revised: 02/02/2014 Document Reviewed: 11/16/2012 Elsevier Interactive Patient Education [employer].

## 2015-04-09 NOTE — Assessment & Plan Note (Signed)
Patient has been discharged from [facility] / Dr. Delight Ovens care; letter was handed to him here today by staff member; he has an upcoming appointment already scheduled at nearby clinic

## 2015-04-09 NOTE — Telephone Encounter (Signed)
I personally handed the patient his discharge letter.

## 2015-04-10 ENCOUNTER — Telehealth: Payer: Self-pay | Admitting: Family Medicine

## 2015-04-10 NOTE — Telephone Encounter (Signed)
That blood sugar is in an acceptable range; I do not suspect his symptoms are from that; I recommend he call his cardiologist if he hasn't already (I instructed him to do so yesterday)

## 2015-04-10 NOTE — Telephone Encounter (Signed)
Pt called stated he has been feeling light headed all day. Stated he checked his sugar this morning at 7am and it was 128. Pt has not checked it recently. Pt stated he has also been taking Muccinex, pt hasn't had any sleep. Please call him ASAP. Thanks.

## 2015-04-10 NOTE — Telephone Encounter (Signed)
Routing to provider for advice.

## 2015-04-10 NOTE — Telephone Encounter (Signed)
Patient notified

## 2015-04-11 NOTE — Progress Notes (Signed)
[name], [name] ([id number]) Visit Report for 04/08/2015 Arrival Information Details Patient Name: [name], [name]. Date of Service: 04/08/2015 8:00 AM Medical Record Number: [id number] Patient Account Number: [id number] Date of Birth/Sex: [birth year] (50 y.o. Male) Treating RN: Ahmed Prima Primary Care Physician: [name], [place] Other Clinician: Referring Physician: [name], [place] Treating Physician/Extender: Frann Rider in Treatment: 13 Visit Information History Since Last Visit All ordered tests and consults were completed: No Patient Arrived: Ambulatory Added or deleted any medications: No Arrival Time: 08:11 Any new allergies or adverse reactions: No Accompanied By: self Had a fall or experienced change in No Transfer Assistance: None activities of daily living that may affect Patient Identification Verified: Yes risk of falls: Secondary Verification Process Yes Signs or symptoms of abuse/neglect since last No Completed: visito Patient Requires Transmission-Based No Hospitalized since last visit: No Precautions: Pain Present Now: No Patient Has Alerts: Yes Patient Alerts: DM II Electronic Signature(s) Signed: 04/10/2015 4:57:15 PM By: Alric Quan Entered By: Alric Quan on 04/08/2015 08:12:08 [name] ([id number]) -------------------------------------------------------------------------------- Clinic Level of Care Assessment Details Patient Name: [name] Date of Service: 04/08/2015 8:00 AM Medical Record Number: [id number] Patient Account Number: [id number] Date of Birth/Sex: 1965-07-11 (50 y.o. Male) Treating RN: Ahmed Prima Primary Care Physician: [name], [place] Other Clinician: Referring Physician: [name], MELINDA Treating Physician/Extender: Frann Rider in Treatment: 13 Clinic Level of Care Assessment Items TOOL 4 Quantity Score '[]'  - Use when only an EandM is performed on FOLLOW-UP visit 0 ASSESSMENTS -  Nursing Assessment / Reassessment '[]'  - Reassessment of Co-morbidities (includes updates in patient status) 0 X - Reassessment of Adherence to Treatment Plan 1 5 ASSESSMENTS - Wound and Skin Assessment / Reassessment '[]'  - Simple Wound Assessment / Reassessment - one wound 0 X - Complex Wound Assessment / Reassessment - multiple wounds 3 5 '[]'  - Dermatologic / Skin Assessment (not related to wound area) 0 ASSESSMENTS - Focused Assessment X - Circumferential Edema Measurements - multi extremities 1 5 '[]'  - Nutritional Assessment / Counseling / Intervention 0 '[]'  - Lower Extremity Assessment (monofilament, tuning fork, pulses) 0 '[]'  - Peripheral Arterial Disease Assessment (using hand held doppler) 0 ASSESSMENTS - Ostomy and/or Continence Assessment and Care '[]'  - Incontinence Assessment and Management 0 '[]'  - Ostomy Care Assessment and Management (repouching, etc.) 0 PROCESS - Coordination of Care X - Simple Patient / Family Education for ongoing care 1 15 '[]'  - Complex (extensive) Patient / Family Education for ongoing care 0 '[]'  - Staff obtains Programmer, systems, Records, Test Results / Process Orders 0 '[]'  - Staff telephones HHA, Nursing Homes / Clarify orders / etc 0 '[]'  - Routine Transfer to another Facility (non-emergent condition) 0 [name], [name] ([id number]) '[]'  - Routine Hospital Admission (non-emergent condition) 0 '[]'  - New Admissions / Biomedical engineer / Ordering NPWT, Apligraf, etc. 0 '[]'  - Emergency Hospital Admission (emergent condition) 0 X - Simple Discharge Coordination 1 10 '[]'  - Complex (extensive) Discharge Coordination 0 PROCESS - Special Needs '[]'  - Pediatric / Minor Patient Management 0 '[]'  - Isolation Patient Management 0 '[]'  - Hearing / Language / Visual special needs 0 '[]'  - Assessment of Community assistance (transportation, D/C planning, etc.) 0 '[]'  - Additional assistance / Altered mentation 0 '[]'  - Support Surface(s) Assessment (bed, cushion, seat, etc.) 0 INTERVENTIONS -  Wound Cleansing / Measurement '[]'  - Simple Wound Cleansing - one wound 0 X - Complex Wound Cleansing - multiple wounds 3 5 '[]'  - Wound Imaging (photographs - any number of wounds)  0 X - Wound Tracing (instead of photographs) 1 5 '[]'  - Simple Wound Measurement - one wound 0 X - Complex Wound Measurement - multiple wounds 3 5 INTERVENTIONS - Wound Dressings X - Small Wound Dressing one or multiple wounds 3 10 '[]'  - Medium Wound Dressing one or multiple wounds 0 '[]'  - Large Wound Dressing one or multiple wounds 0 X - Application of Medications - topical 1 5 '[]'  - Application of Medications - injection 0 INTERVENTIONS - Miscellaneous '[]'  - External ear exam 0 [name], [name] ([id number]) '[]'  - Specimen Collection (cultures, biopsies, blood, body fluids, etc.) 0 '[]'  - Specimen(s) / Culture(s) sent or taken to Lab for analysis 0 '[]'  - Patient Transfer (multiple staff / Harrel Lemon Lift / Similar devices) 0 '[]'  - Simple Staple / Suture removal (25 or less) 0 '[]'  - Complex Staple / Suture removal (26 or more) 0 '[]'  - Hypo / Hyperglycemic Management (close monitor of Blood Glucose) 0 '[]'  - Ankle / Brachial Index (ABI) - do not check if billed separately 0 X - Vital Signs 1 5 Has the patient been seen at the hospital within the last three years: Yes Total Score: 125 Level Of Care: New/Established - Level 4 Electronic Signature(s) Signed: 04/10/2015 4:57:15 PM By: Alric Quan Entered By: Alric Quan on 04/08/2015 16:11:33 [name] ([id number]) -------------------------------------------------------------------------------- Encounter Discharge Information Details Patient Name: [name] Date of Service: 04/08/2015 8:00 AM Medical Record Number: [id number] Patient Account Number: [id number] Date of Birth/Sex: 1965/04/26 (50 y.o. Male) Treating RN: Ahmed Prima Primary Care Physician: [name], [place] Other Clinician: Referring Physician: [name], [place] Treating Physician/Extender:  Frann Rider in Treatment: 13 Encounter Discharge Information Items Discharge Pain Level: 0 Discharge Condition: Stable Ambulatory Status: Ambulatory Discharge Destination: Home Private Transportation: Auto Accompanied By: self Schedule Follow-up Appointment: Yes Medication Reconciliation completed and Yes provided to Patient/Care Kenzlie Disch: Clinical Summary of Care: Electronic Signature(s) Signed: 04/10/2015 4:57:15 PM By: Alric Quan Entered By: Alric Quan on 04/08/2015 08:37:11 [name] ([id number]) -------------------------------------------------------------------------------- Lower Extremity Assessment Details Patient Name: [name] Date of Service: 04/08/2015 8:00 AM Medical Record Number: [id number] Patient Account Number: [id number] Date of Birth/Sex: 1965-03-09 (50 y.o. Male) Treating RN: Ahmed Prima Primary Care Physician: [name], [place] Other Clinician: Referring Physician: [name], MELINDA Treating Physician/Extender: Frann Rider in Treatment: 13 Edema Assessment Assessed: [Left: No] [Right: No] E[Left: dema] [Right: :] Calf Left: Right: Point of Measurement: 34 cm From Medial Instep cm 46 cm Ankle Left: Right: Point of Measurement: 9 cm From Medial Instep cm 31.4 cm Vascular Assessment Pulses: Posterior Tibial Dorsalis Pedis Palpable: [Right:Yes] Extremity colors, hair growth, and conditions: Extremity Color: [Right:Hyperpigmented] Temperature of Extremity: [Right:Warm] Capillary Refill: [Right:< 3 seconds] Electronic Signature(s) Signed: 04/10/2015 4:57:15 PM By: Alric Quan Entered By: Alric Quan on 04/08/2015 08:15:22 [name] ([id number]) -------------------------------------------------------------------------------- Multi Wound Chart Details Patient Name: [name] Date of Service: 04/08/2015 8:00 AM Medical Record Number: [id number] Patient Account Number: [id number] Date  of Birth/Sex: [birth year] (50 y.o. Male) Treating RN: Ahmed Prima Primary Care Physician: [name], [place] Other Clinician: Referring Physician: [name], MELINDA Treating Physician/Extender: Frann Rider in Treatment: 13 Vital Signs Height(in): 71 Pulse(bpm): 87 Weight(lbs): 360 Blood Pressure 162/99 (mmHg): Body Mass Index(BMI): 50 Temperature(F): 98.5 Respiratory Rate 20 (breaths/min): Photos: [2:No Photos] [3:No Photos] [4:No Photos] Wound Location: [2:Right Lower Leg - Lateral, Right Lower Leg - Medial Right Lower Leg - Distal Superior] Wounding Event: [2:Gradually Appeared] [3:Gradually Appeared] [4:Gradually Appeared] Primary Etiology: [2:Venous Leg Ulcer] [  3:Venous Leg Ulcer] [4:Venous Leg Ulcer] Comorbid History: [2:Lymphedema, Congestive Lymphedema, Congestive Lymphedema, Congestive Heart Failure, Hypertension, Type II Diabetes, Gout] [3:Heart Failure, Hypertension, Type II Diabetes, Gout] [4:Heart Failure, Hypertension, Type II Diabetes, Gout] Date Acquired: [2:12/13/2014] [3:12/13/2014] [4:12/13/2014] Weeks of Treatment: [2:13] [3:13] [4:13] Wound Status: [2:Open] [3:Open] [4:Open] Measurements L x W x D 0.2x0.2x1 [3:0.3x0.2x0.1] [4:0.3x0.3x0.1] (cm) Area (cm) : [2:0.031] [3:0.047] [4:0.071] Volume (cm) : [2:0.031] [3:0.005] [4:0.007] % Reduction in Area: [2:96.10%] [3:97.50%] [4:95.40%] % Reduction in Volume: 80.30% [3:98.70%] [4:97.70%] Classification: [2:Full Thickness Without Exposed Support Structures] [3:Full Thickness Without Exposed Support Structures] [4:Full Thickness Without Exposed Support Structures] HBO Classification: [2:Grade 1] [3:Grade 1] [4:Grade 1] Exudate Amount: [2:Large] [3:Large] [4:Large] Exudate Type: [2:Purulent] [3:Serous] [4:Serous] Exudate Color: [2:yellow, brown, green] [3:amber] [4:amber] Wound Margin: [2:Flat and Intact] [3:Flat and Intact] [4:Flat and Intact] Granulation Amount: [2:Large (67-100%)] [3:Medium (34-66%)]  [4:None Present (0%)] Granulation Quality: [2:Pink] [3:Red, Pink] [4:N/A] Necrotic Amount: [2:Small (1-33%)] [3:Medium (34-66%)] [4:Large (67-100%)] Necrotic Tissue: [2:Adherent Slough] [3:Adherent Slough] [4:Eschar, Adherent Slough] Exposed Structures: Fascia: No Fascia: No Fascia: No Fat: No Fat: No Fat: No Tendon: No Tendon: No Tendon: No Muscle: No Muscle: No Muscle: No Joint: No Joint: No Joint: No Bone: No Bone: No Bone: No Limited to Skin Limited to Skin Limited to Skin Breakdown Breakdown Breakdown Epithelialization: None None None Periwound Skin Texture: Edema: Yes Edema: Yes Edema: Yes Excoriation: No Excoriation: No Induration: No Induration: No Callus: No Callus: No Crepitus: No Crepitus: No Fluctuance: No Fluctuance: No Friable: No Friable: No Rash: No Rash: No Scarring: No Scarring: No Periwound Skin Moist: Yes Moist: Yes Moist: Yes Moisture: Maceration: No Maceration: No Dry/Scaly: No Dry/Scaly: No Periwound Skin Color: No Abnormalities Noted Atrophie Blanche: No Atrophie Blanche: No Cyanosis: No Cyanosis: No Ecchymosis: No Ecchymosis: No Erythema: No Erythema: No Hemosiderin Staining: No Hemosiderin Staining: No Mottled: No Mottled: No Pallor: No Pallor: No Rubor: No Rubor: No Temperature: No Abnormality N/A No Abnormality Tenderness on Yes Yes Yes Palpation: Wound Preparation: Ulcer Cleansing: Ulcer Cleansing: Ulcer Cleansing: Rinsed/Irrigated with Rinsed/Irrigated with Rinsed/Irrigated with Saline Saline Saline Topical Anesthetic Topical Anesthetic Topical Anesthetic Applied: Other: lidocaine Applied: Other: lidocaine Applied: Other: lidocaine 4% 4% 4% Treatment Notes Electronic Signature(s) Signed: 04/10/2015 4:57:15 PM By: Alric Quan Entered By: Alric Quan on 04/08/2015 08:26:39 [name]  ([id number]) -------------------------------------------------------------------------------- [place] Details Patient Name: [name] Date of Service: 04/08/2015 8:00 AM Medical Record Number: [id number] Patient Account Number: [id number] Date of Birth/Sex: 1965/09/12 (50 y.o. Male) Treating RN: Ahmed Prima Primary Care Physician: [name], [place] Other Clinician: Referring Physician: [name], MELINDA Treating Physician/Extender: Frann Rider in Treatment: 7 Active Inactive Abuse / Safety / Falls / Self Care Management Nursing Diagnoses: Potential for falls Goals: Patient will remain injury free Date Initiated: 01/01/2015 Goal Status: Active Patient/caregiver will verbalize/demonstrate measures taken to prevent injury and/or falls Date Initiated: 01/01/2015 Goal Status: Active Interventions: Assess fall risk on admission and as needed Notes: Nutrition Nursing Diagnoses: Imbalanced nutrition Goals: Patient/caregiver agrees to and verbalizes understanding of need to use nutritional supplements and/or vitamins as prescribed Date Initiated: 01/01/2015 Goal Status: Active Patient/caregiver verbalizes understanding of need to maintain therapeutic glucose control per primary care physician Date Initiated: 01/01/2015 Goal Status: Active Patient/caregiver will maintain therapeutic glucose control Date Initiated: 01/01/2015 Goal Status: Active Interventions: [name], [name] ([id number]) Provide education on elevated blood sugars and impact on wound healing Provide education on nutrition Treatment Activities: Education provided on Nutrition : 01/08/2015 Notes: Orientation to the Wound  Care Program Nursing Diagnoses: Knowledge deficit related to the wound healing center program Goals: Patient/caregiver will verbalize understanding of the [facility] Date Initiated: 01/01/2015 Goal Status: Active Interventions: Provide  education on orientation to the wound center Notes: Pain, Acute or Chronic Nursing Diagnoses: Pain, acute or chronic: actual or potential Goals: Patient will verbalize adequate pain control and receive pain control interventions during procedures as needed Date Initiated: 01/01/2015 Goal Status: Active Patient/caregiver will verbalize adequate pain control between visits Date Initiated: 01/01/2015 Goal Status: Active Patient/caregiver will verbalize comfort level met Date Initiated: 01/01/2015 Goal Status: Active Interventions: Assess comfort goal upon admission Complete pain assessment as per visit requirements Notes: Wound/Skin Impairment [name], [name] ([id number]) Nursing Diagnoses: Impaired tissue integrity Knowledge deficit related to smoking impact on wound healing Knowledge deficit related to ulceration/compromised skin integrity Goals: Patient will demonstrate a reduced rate of smoking or cessation of smoking Date Initiated: 01/01/2015 Goal Status: Active Patient/caregiver will verbalize understanding of skin care regimen Date Initiated: 01/01/2015 Goal Status: Active Ulcer/skin breakdown will have a volume reduction of 30% by week 4 Date Initiated: 01/01/2015 Goal Status: Active Ulcer/skin breakdown will have a volume reduction of 50% by week 8 Date Initiated: 01/01/2015 Goal Status: Active Ulcer/skin breakdown will have a volume reduction of 80% by week 12 Date Initiated: 01/01/2015 Goal Status: Active Interventions: Assess patient/caregiver ability to obtain necessary supplies Assess patient/caregiver ability to perform ulcer/skin care regimen upon admission and as needed Notes: Electronic Signature(s) Signed: 04/10/2015 4:57:15 PM By: Alric Quan Entered By: Alric Quan on 04/08/2015 08:26:30 [name] ([id number]) -------------------------------------------------------------------------------- Pain Assessment Details Patient Name: [name] Date of Service: 04/08/2015 8:00 AM Medical Record Number: [id number] Patient Account Number: [id number] Date of Birth/Sex: 08-23-1965 (50 y.o. Male) Treating RN: Ahmed Prima Primary Care Physician: [name], [place] Other Clinician: Referring Physician: [name], [place] Treating Physician/Extender: Frann Rider in Treatment: 13 Active Problems Location of Pain Severity and Description of Pain Patient Has Paino No Site Locations Pain Management and Medication Current Pain Management: Electronic Signature(s) Signed: 04/10/2015 4:57:15 PM By: Alric Quan Entered By: Alric Quan on 04/08/2015 08:12:13 [name] ([id number]) -------------------------------------------------------------------------------- Patient/Caregiver Education Details Patient Name: [name] Date of Service: 04/08/2015 8:00 AM Medical Record Number: [id number] Patient Account Number: [id number] Date of Birth/Gender: July 02, 1965 (50 y.o. Male) Treating RN: Ahmed Prima Primary Care Physician: [name], [place] Other Clinician: Referring Physician: [name], [place] Treating Physician/Extender: Frann Rider in Treatment: 13 Education Assessment Education Provided To: Patient Education Topics Provided Wound/Skin Impairment: Handouts: Other: change dressing as ordered Methods: Demonstration, Explain/Verbal Responses: State content correctly Electronic Signature(s) Signed: 04/10/2015 4:57:15 PM By: Alric Quan Entered By: Alric Quan on 04/08/2015 08:37:29 [name] ([id number]) -------------------------------------------------------------------------------- Wound Assessment Details Patient Name: [name] Date of Service: 04/08/2015 8:00 AM Medical Record Number: [id number] Patient Account Number: [id number] Date of Birth/Sex: January 21, 1966 (50 y.o. Male) Treating RN: Ahmed Prima Primary Care Physician: [name], [place] Other  Clinician: Referring Physician: [name], MELINDA Treating Physician/Extender: Frann Rider in Treatment: 13 Wound Status Wound Number: 2 Primary Venous Leg Ulcer Etiology: Wound Location: Right Lower Leg - Lateral, Superior Wound Open Status: Wounding Event: Gradually Appeared Comorbid Lymphedema, Congestive Heart Date Acquired: 12/13/2014 History: Failure, Hypertension, Type II Weeks Of Treatment: 13 Diabetes, Gout Clustered Wound: No Photos Photo Uploaded By: Alric Quan on 04/08/2015 16:47:27 Wound Measurements Length: (cm) 0.2 Width: (cm) 0.2 Depth: (cm) 1 Area: (cm) 0.031 Volume: (cm) 0.031 % Reduction in Area: 96.1% % Reduction in  Volume: 80.3% Epithelialization: None Tunneling: No Undermining: No Wound Description Full Thickness Without Classification: Exposed Support Structures Diabetic Severity Grade 1 (Wagner): Wound Margin: Flat and Intact Exudate Amount: Large Exudate Type: Purulent Exudate Color: yellow, brown, green Foul Odor After Cleansing: No Wound Bed Granulation Amount: Large (67-100%) Exposed Structure [name], [name] ([id number]) Granulation Quality: Pink Fascia Exposed: No Necrotic Amount: Small (1-33%) Fat Layer Exposed: No Necrotic Quality: Adherent Slough Tendon Exposed: No Muscle Exposed: No Joint Exposed: No Bone Exposed: No Limited to Skin Breakdown Periwound Skin Texture Texture Color No Abnormalities Noted: No No Abnormalities Noted: No Localized Edema: Yes Temperature / Pain Moisture Temperature: No Abnormality No Abnormalities Noted: No Tenderness on Palpation: Yes Moist: Yes Wound Preparation Ulcer Cleansing: Rinsed/Irrigated with Saline Topical Anesthetic Applied: Other: lidocaine 4%, Treatment Notes Wound #2 (Right, Lateral, Superior Lower Leg) 5. Secondary Dressing Applied Bordered Foam Dressing Electronic Signature(s) Signed: 04/10/2015 4:57:15 PM By: Alric Quan Entered By: Alric Quan on 04/08/2015 08:25:18 [name] ([id number]) -------------------------------------------------------------------------------- Wound Assessment Details Patient Name: [name] Date of Service: 04/08/2015 8:00 AM Medical Record Number: [id number] Patient Account Number: [id number] Date of Birth/Sex: 1965/11/10 (50 y.o. Male) Treating RN: Ahmed Prima Primary Care Physician: [name], [place] Other Clinician: Referring Physician: [name], [place] Treating Physician/Extender: Frann Rider in Treatment: 13 Wound Status Wound Number: 3 Primary Venous Leg Ulcer Etiology: Wound Location: Right Lower Leg - Medial Wound Open Wounding Event: Gradually Appeared Status: Date Acquired: 12/13/2014 Comorbid Lymphedema, Congestive Heart Weeks Of Treatment: 13 History: Failure, Hypertension, Type II Clustered Wound: No Diabetes, Gout Photos Photo Uploaded By: Alric Quan on 04/08/2015 16:47:27 Wound Measurements Length: (cm) 0.3 Width: (cm) 0.2 Depth: (cm) 0.1 Area: (cm) 0.047 Volume: (cm) 0.005 % Reduction in Area: 97.5% % Reduction in Volume: 98.7% Epithelialization: None Tunneling: No Undermining: No Wound Description Full Thickness Without Classification: Exposed Support Structures Diabetic Severity Grade 1 (Wagner): Wound Margin: Flat and Intact Exudate Amount: Large Exudate Type: Serous Exudate Color: amber Foul Odor After Cleansing: No Wound Bed Granulation Amount: Medium (34-66%) Exposed Structure [name], [place]. ([id number]) Granulation Quality: Red, Pink Fascia Exposed: No Necrotic Amount: Medium (34-66%) Fat Layer Exposed: No Necrotic Quality: Adherent Slough Tendon Exposed: No Muscle Exposed: No Joint Exposed: No Bone Exposed: No Limited to Skin Breakdown Periwound Skin Texture Texture Color No Abnormalities Noted: No No Abnormalities Noted: No Callus: No Atrophie Blanche: No Crepitus: No Cyanosis: No Excoriation:  No Ecchymosis: No Fluctuance: No Erythema: No Friable: No Hemosiderin Staining: No Induration: No Mottled: No Localized Edema: Yes Pallor: No Rash: No Rubor: No Scarring: No Temperature / Pain Moisture Tenderness on Palpation: Yes No Abnormalities Noted: No Dry / Scaly: No Maceration: No Moist: Yes Wound Preparation Ulcer Cleansing: Rinsed/Irrigated with Saline Topical Anesthetic Applied: Other: lidocaine 4%, Treatment Notes Wound #3 (Right, Medial Lower Leg) 5. Secondary Dressing Applied Bordered Foam Dressing Electronic Signature(s) Signed: 04/10/2015 4:57:15 PM By: Alric Quan Entered By: Alric Quan on 04/08/2015 08:24:44 [name] ([id number]) -------------------------------------------------------------------------------- Wound Assessment Details Patient Name: [name] Date of Service: 04/08/2015 8:00 AM Medical Record Number: [id number] Patient Account Number: [id number] Date of Birth/Sex: Jun 07, 1965 (50 y.o. Male) Treating RN: Ahmed Prima Primary Care Physician: [name], [place] Other Clinician: Referring Physician: [name], MELINDA Treating Physician/Extender: Frann Rider in Treatment: 13 Wound Status Wound Number: 4 Primary Venous Leg Ulcer Etiology: Wound Location: Right Lower Leg - Distal Wound Open Wounding Event: Gradually Appeared Status: Date Acquired: 12/13/2014 Comorbid Lymphedema, Congestive Heart Weeks Of Treatment: 13 History:  Failure, Hypertension, Type II Clustered Wound: No Diabetes, Gout Photos Photo Uploaded By: Alric Quan on 04/08/2015 16:47:37 Wound Measurements Length: (cm) 0.3 Width: (cm) 0.3 Depth: (cm) 0.1 Area: (cm) 0.071 Volume: (cm) 0.007 % Reduction in Area: 95.4% % Reduction in Volume: 97.7% Epithelialization: None Tunneling: No Undermining: No Wound Description Full Thickness Without Classification: Exposed Support Structures Diabetic Severity Grade  1 (Wagner): Wound Margin: Flat and Intact Exudate Amount: Large Exudate Type: Serous Exudate Color: amber Foul Odor After Cleansing: No Wound Bed Granulation Amount: None Present (0%) Exposed Structure [name], [name] ([id number]) Necrotic Amount: Large (67-100%) Fascia Exposed: No Necrotic Quality: Eschar, Adherent Slough Fat Layer Exposed: No Tendon Exposed: No Muscle Exposed: No Joint Exposed: No Bone Exposed: No Limited to Skin Breakdown Periwound Skin Texture Texture Color No Abnormalities Noted: No No Abnormalities Noted: No Callus: No Atrophie Blanche: No Crepitus: No Cyanosis: No Excoriation: No Ecchymosis: No Fluctuance: No Erythema: No Friable: No Hemosiderin Staining: No Induration: No Mottled: No Localized Edema: Yes Pallor: No Rash: No Rubor: No Scarring: No Temperature / Pain Moisture Temperature: No Abnormality No Abnormalities Noted: No Tenderness on Palpation: Yes Dry / Scaly: No Maceration: No Moist: Yes Wound Preparation Ulcer Cleansing: Rinsed/Irrigated with Saline Topical Anesthetic Applied: Other: lidocaine 4%, Treatment Notes Wound #4 (Right, Distal Lower Leg) 5. Secondary Dressing Applied Bordered Foam Dressing Electronic Signature(s) Signed: 04/10/2015 4:57:15 PM By: Alric Quan Entered By: Alric Quan on 04/08/2015 08:24:17 [name] ([id number]) -------------------------------------------------------------------------------- [place] Details Patient Name: [name] Date of Service: 04/08/2015 8:00 AM Medical Record Number: [id number] Patient Account Number: [id number] Date of Birth/Sex: February 04, 1965 (50 y.o. Male) Treating RN: Ahmed Prima Primary Care Physician: [name], [place] Other Clinician: Referring Physician: [name], [place] Treating Physician/Extender: Frann Rider in Treatment: 13 Vital Signs Time Taken: 08:12 Temperature (F): 98.5 Height (in): 71 Pulse (bpm): 87 Weight (lbs):  360 Respiratory Rate (breaths/min): 20 Body Mass Index (BMI): 50.2 Blood Pressure (mmHg): 162/99 Reference Range: 80 - 120 mg / dl Electronic Signature(s) Signed: 04/10/2015 4:57:15 PM By: Alric Quan Entered By: Alric Quan on 04/08/2015 08:12:31

## 2015-04-11 NOTE — Progress Notes (Signed)
CHISOM, RICHESIN ([id number]) Visit Report for 04/08/2015 Chief Complaint Document Details Patient Name: [name]. Date of Service: 04/08/2015 8:00 AM Medical Record Number: [id number] Patient Account Number: [id number] Date of Birth/Sex: 01/27/1965 (50 y.o. Male) Treating RN: Ahmed Prima Primary Care Physician: [name], [place] Other Clinician: Referring Physician: [name], MELINDA Treating Physician/Extender: Frann Rider in Treatment: 13 Information Obtained from: Patient Chief Complaint Patients presents for treatment of an open diabetic ulcer over the right lower extremity with swelling of both legs for about 1 month. Electronic Signature(s) Signed: 04/08/2015 8:40:18 AM By: Christin Fudge MD, FACS Entered By: Christin Fudge on 04/08/2015 08:40:18 [name] ([id number]) -------------------------------------------------------------------------------- HPI Details Patient Name: [name] Date of Service: 04/08/2015 8:00 AM Medical Record Number: [id number] Patient Account Number: [id number] Date of Birth/Sex: 1965/03/31 (50 y.o. Male) Treating RN: Ahmed Prima Primary Care Physician: [name], [place] Other Clinician: Referring Physician: [name], MELINDA Treating Physician/Extender: Frann Rider in Treatment: 13 History of Present Illness Location: Started getting pustules and boils on his right lower extremity along with swelling and was treated for this Quality: Patient reports experiencing a dull pain to affected area(s). Severity: Patient states wound are getting worse. Duration: Patient has had the wound for < 4 weeks prior to presenting for treatment Timing: Pain in wound is Intermittent (comes and goes Context: The wound appeared gradually over time Modifying Factors: Other treatment(s) tried include: local care and antibiotics and some compression stockings. Associated Signs and Symptoms: Patient reports having difficulty standing for  long periods. HPI Description: 50 year old gentleman who has type 2 diabetes mellitus and morbid obesity has been having cellulitis and abscess of the right lower leg for the last 3 weeks. He has had multiple antibiotics and an IandD and over the last 10 days has been on Septra DS and doxycycline and prior to that Keflex. the patient is a poor historian and does not give a definite timeline but says he's been diagnosed with diabetes mellitus for about 8-10 months and has been on insulin. He does not measure his blood sugars regularly nor does he see his PCP regularly. Also has congestive heart failure for which he sees a cardiologist at [facility] but this is going to change as the cardiologist as retired. He is a smoker and smokes several cigars a day and drinks alcohol occasionally. He has been morbidly obese for several years. 112/13/2016 -- the patient complainss about having to use his 3 layer compression wrap and has multiple issues with this none of them being medically oriented. He also continues to smoke, has not yet seen his cardiologist or his PCP and in general has continued to not look after himself. 01/15/2015 -- the patient did not allow [place] to put his 3 layer compression wrap last week and has been using his own compression stockings. Continues to smoke. He did see his cardiologist and they have increased his dose of Lasix, asked him to repeat an echo, lose weight and be compliant with his diabetic control. 01/29/2015 -- he has given up smoking for about the last week. He is unable to keep his appointment on 01/30/2015 with the vascular surgeons and has rescheduled it for next week. 02/12/2015 -- he continues to have a lot of edema and drains from the wounds. However he is not agreeable to wear 3 or 4 layer compression wraps. He wants to wear his 20-30 mm compression stockings only. 02/19/2015 -- he was seen by the vascular surgeon Dr. Delana Meyer on 02/18/2015 and a  duplex  ultrasound of the lower extremities shows normal deep venous system, superficial reflux is not present in the great saphenous veins bilaterally and the left small saphenous vein. There is reflux in the small saphenous vein on the right and there are several sporadic varicosities and the distal thigh on the right. He was not offered surgery but graduated compression stockings class I were recommended on a daily basis and was given a prescription for this. He would follow-up with them in 3 months for possible using of lymph pumps at that [name], [name]. ([id number]) time. 03/25/2015 -- he has been taking his diuretic regularly and uses his compression stockings as prescribed. He has become much more compliant recently. Electronic Signature(s) Signed: 04/08/2015 8:40:22 AM By: Christin Fudge MD, FACS Entered By: Christin Fudge on 04/08/2015 08:40:22 [name] ([id number]) -------------------------------------------------------------------------------- Physical Exam Details Patient Name: [name] Date of Service: 04/08/2015 8:00 AM Medical Record Number: [id number] Patient Account Number: [id number] Date of Birth/Sex: 03-15-1965 (50 y.o. Male) Treating RN: Ahmed Prima Primary Care Physician: [name], [place] Other Clinician: Referring Physician: [name], MELINDA Treating Physician/Extender: Frann Rider in Treatment: 13 Constitutional . Pulse regular. Respirations normal and unlabored. Afebrile. . Eyes Nonicteric. Reactive to light. Ears, Nose, Mouth, and Throat Lips, teeth, and gums WNL.[name] Kitchen Moist mucosa without lesions. Neck supple and nontender. No palpable supraclavicular or cervical adenopathy. Normal sized without goiter. Respiratory WNL. No retractions.. Cardiovascular Pedal Pulses WNL. No clubbing, cyanosis or edema. Lymphatic No adneopathy. No adenopathy. No adenopathy. Musculoskeletal Adexa without tenderness or enlargement.. Digits and nails w/o  clubbing, cyanosis, infection, petechiae, ischemia, or inflammatory conditions.. Integumentary (Hair, Skin) No suspicious lesions. No crepitus or fluctuance. No peri-wound warmth or erythema. No masses.[name] Kitchen Psychiatric Judgement and insight Intact.. No evidence of depression, anxiety, or agitation.. Notes the lower 2 wounds have healed and the most superior wound continues to have some depth and serous fluid draining and he is unable to pack this any longer. Electronic Signature(s) Signed: 04/08/2015 8:40:50 AM By: Christin Fudge MD, FACS Entered By: Christin Fudge on 04/08/2015 08:40:49 [name] ([id number]) -------------------------------------------------------------------------------- Physician Orders Details Patient Name: [name] Date of Service: 04/08/2015 8:00 AM Medical Record Number: [id number] Patient Account Number: [id number] Date of Birth/Sex: 13-Jul-1965 (49 y.o. Male) Treating RN: Ahmed Prima Primary Care Physician: [name], [place] Other Clinician: Referring Physician: [name], MELINDA Treating Physician/Extender: Frann Rider in Treatment: 50 Verbal / Phone Orders: Yes ClinicianCarolyne Fiscal, Debi Read Back and Verified: Yes Diagnosis Coding Wound Cleansing Wound #2 Right,Lateral,Superior Lower Leg o Clean wound with Normal Saline. o May Shower, gently pat wound dry prior to applying new dressing. Wound #3 Right,Medial Lower Leg o Clean wound with Normal Saline. o May Shower, gently pat wound dry prior to applying new dressing. Wound #4 Right,Distal Lower Leg o Clean wound with Normal Saline. o May Shower, gently pat wound dry prior to applying new dressing. Anesthetic Wound #2 Right,Lateral,Superior Lower Leg o Topical Lidocaine 4% cream applied to wound bed prior to debridement Wound #3 Right,Medial Lower Leg o Topical Lidocaine 4% cream applied to wound bed prior to debridement Wound #4 Right,Distal Lower Leg o Topical  Lidocaine 4% cream applied to wound bed prior to debridement Secondary Dressing Wound #2 Right,Lateral,Superior Lower Leg o Boardered Foam Dressing Wound #3 Right,Medial Lower Leg o Boardered Foam Dressing Wound #4 Right,Distal Lower Leg o Boardered Foam Dressing Dressing Change Frequency Wound #2 Right,Lateral,Superior Lower Leg o Change dressing every day. [name], [name] ([id number]) Wound #3 Right,Medial  Lower Leg o Change dressing every day. Wound #4 Right,Distal Lower Leg o Change dressing every day. Follow-up Appointments Wound #2 Right,Lateral,Superior Lower Leg o Return Appointment in 1 week. Wound #3 Right,Medial Lower Leg o Return Appointment in 1 week. Wound #4 Right,Distal Lower Leg o Return Appointment in 1 week. Edema Control Wound #2 Right,Lateral,Superior Lower Leg o Elevate legs to the level of the heart and pump ankles as often as possible o Other: - wear compression socks Wound #3 Right,Medial Lower Leg o Elevate legs to the level of the heart and pump ankles as often as possible o Other: - wear compression socks Wound #4 Right,Distal Lower Leg o Elevate legs to the level of the heart and pump ankles as often as possible o Other: - wear compression socks Electronic Signature(s) Signed: 04/08/2015 4:11:43 PM By: Christin Fudge MD, FACS Signed: 04/10/2015 4:57:15 PM By: Alric Quan Entered By: Alric Quan on 04/08/2015 08:36:34 [name] ([id number]) -------------------------------------------------------------------------------- Problem List Details Patient Name: [name] Date of Service: 04/08/2015 8:00 AM Medical Record Number: [id number] Patient Account Number: [id number] Date of Birth/Sex: 02-27-1965 (49 y.o. Male) Treating RN: Ahmed Prima Primary Care Physician: [name], [place] Other Clinician: Referring Physician: [name], [place] Treating Physician/Extender: Frann Rider in  Treatment: 13 Active Problems ICD-10 Encounter Code Description Active Date Diagnosis E11.622 Type 2 diabetes mellitus with other skin ulcer 01/01/2015 Yes E66.01 Morbid (severe) obesity due to excess calories 01/01/2015 Yes L97.212 Non-pressure chronic ulcer of right calf with fat layer 02/05/2015 Yes exposed I89.0 Lymphedema, not elsewhere classified 01/01/2015 Yes I50.40 Unspecified combined systolic (congestive) and diastolic 99991111 Yes (congestive) heart failure F17.218 Nicotine dependence, cigarettes, with other nicotine- 01/01/2015 Yes induced disorders 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 02/19/2015 Yes Inactive Problems Resolved Problems ICD-10 Code Description Active Date Resolved Date L02.415 Cutaneous abscess of right lower limb 01/01/2015 01/01/2015 [name] ([id number]) Electronic Signature(s) Signed: 04/08/2015 8:40:08 AM By: Christin Fudge MD, FACS Entered By: Christin Fudge on 04/08/2015 08:40:08 [name] ([id number]) -------------------------------------------------------------------------------- Progress Note Details Patient Name: [name] Date of Service: 04/08/2015 8:00 AM Medical Record Number: [id number] Patient Account Number: [id number] Date of Birth/Sex: Aug 26, 1965 (50 y.o. Male) Treating RN: Ahmed Prima Primary Care Physician: [name], [place] Other Clinician: Referring Physician: [name], MELINDA Treating Physician/Extender: Frann Rider in Treatment: 13 Subjective Chief Complaint Information obtained from Patient Patients presents for treatment of an open diabetic ulcer over the right lower extremity with swelling of both legs for about 1 month. History of Present Illness (HPI) The following HPI elements were documented for the patient's wound: Location: Started getting pustules and boils on his right lower extremity along with swelling and was treated for this Quality: Patient reports  experiencing a dull pain to affected area(s). Severity: Patient states wound are getting worse. Duration: Patient has had the wound for < 4 weeks prior to presenting for treatment Timing: Pain in wound is Intermittent (comes and goes Context: The wound appeared gradually over time Modifying Factors: Other treatment(s) tried include: local care and antibiotics and some compression stockings. Associated Signs and Symptoms: Patient reports having difficulty standing for long periods. 50 year old gentleman who has type 2 diabetes mellitus and morbid obesity has been having cellulitis and abscess of the right lower leg for the last 3 weeks. He has had multiple antibiotics and an IandD and over the last 10 days has been on Septra DS and doxycycline and prior to that Keflex. the patient is a poor  historian and does not give a definite timeline but says he's been diagnosed with diabetes mellitus for about 8-10 months and has been on insulin. He does not measure his blood sugars regularly nor does he see his PCP regularly. Also has congestive heart failure for which he sees a cardiologist at [facility] but this is going to change as the cardiologist as retired. He is a smoker and smokes several cigars a day and drinks alcohol occasionally. He has been morbidly obese for several years. 112/13/2016 -- the patient complainss about having to use his 3 layer compression wrap and has multiple issues with this none of them being medically oriented. He also continues to smoke, has not yet seen his cardiologist or his PCP and in general has continued to not look after himself. 01/15/2015 -- the patient did not allow [place] to put his 3 layer compression wrap last week and has been using his own compression stockings. Continues to smoke. He did see his cardiologist and they have increased his dose of Lasix, asked him to repeat an echo, lose weight and be compliant with his diabetic control. 01/29/2015 -- he has  given up smoking for about the last week. He is unable to keep his appointment on 01/30/2015 with the vascular surgeons and has rescheduled it for next week. 02/12/2015 -- he continues to have a lot of edema and drains from the wounds. However he is not [name], [name]. ([id number]) agreeable to wear 3 or 4 layer compression wraps. He wants to wear his 20-30 mm compression stockings only. 02/19/2015 -- he was seen by the vascular surgeon Dr. Delana Meyer on 02/18/2015 and a duplex ultrasound of the lower extremities shows normal deep venous system, superficial reflux is not present in the great saphenous veins bilaterally and the left small saphenous vein. There is reflux in the small saphenous vein on the right and there are several sporadic varicosities and the distal thigh on the right. He was not offered surgery but graduated compression stockings class I were recommended on a daily basis and was given a prescription for this. He would follow-up with them in 3 months for possible using of lymph pumps at that time. 03/25/2015 -- he has been taking his diuretic regularly and uses his compression stockings as prescribed. He has become much more compliant recently. Objective Constitutional Pulse regular. Respirations normal and unlabored. Afebrile. Vitals Time Taken: 8:12 AM, Height: 71 in, Weight: 360 lbs, BMI: 50.2, Temperature: 98.5 F, Pulse: 87 bpm, Respiratory Rate: 20 breaths/min, Blood Pressure: 162/99 mmHg. Eyes Nonicteric. Reactive to light. Ears, Nose, Mouth, and Throat Lips, teeth, and gums WNL.[name] Kitchen Moist mucosa without lesions. Neck supple and nontender. No palpable supraclavicular or cervical adenopathy. Normal sized without goiter. Respiratory WNL. No retractions.. Cardiovascular Pedal Pulses WNL. No clubbing, cyanosis or edema. Lymphatic No adneopathy. No adenopathy. No adenopathy. Musculoskeletal Adexa without tenderness or enlargement.. Digits and nails w/o clubbing,  cyanosis, infection, petechiae, ischemia, or inflammatory conditions.[name] Kitchen Psychiatric [name], [name] ([id number]) Judgement and insight Intact.. No evidence of depression, anxiety, or agitation.. General Notes: the lower 2 wounds have healed and the most superior wound continues to have some depth and serous fluid draining and he is unable to pack this any longer. Integumentary (Hair, Skin) No suspicious lesions. No crepitus or fluctuance. No peri-wound warmth or erythema. No masses.. Wound #2 status is Open. Original cause of wound was Gradually Appeared. The wound is located on the Right,Lateral,Superior Lower Leg. The wound measures 0.2cm length x 0.2cm width  x 1cm depth; 0.031cm^2 area and 0.031cm^3 volume. The wound is limited to skin breakdown. There is no tunneling or undermining noted. There is a large amount of purulent drainage noted. The wound margin is flat and intact. There is large (67-100%) pink granulation within the wound bed. There is a small (1-33%) amount of necrotic tissue within the wound bed including Adherent Slough. The periwound skin appearance exhibited: Localized Edema, Moist. Periwound temperature was noted as No Abnormality. The periwound has tenderness on palpation. Wound #3 status is Open. Original cause of wound was Gradually Appeared. The wound is located on the Right,Medial Lower Leg. The wound measures 0.3cm length x 0.2cm width x 0.1cm depth; 0.047cm^2 area and 0.005cm^3 volume. The wound is limited to skin breakdown. There is no tunneling or undermining noted. There is a large amount of serous drainage noted. The wound margin is flat and intact. There is medium (34-66%) red, pink granulation within the wound bed. There is a medium (34-66%) amount of necrotic tissue within the wound bed including Adherent Slough. The periwound skin appearance exhibited: Localized Edema, Moist. The periwound skin appearance did not exhibit: Callus, Crepitus,  Excoriation, Fluctuance, Friable, Induration, Rash, Scarring, Dry/Scaly, Maceration, Atrophie Blanche, Cyanosis, Ecchymosis, Hemosiderin Staining, Mottled, Pallor, Rubor, Erythema. The periwound has tenderness on palpation. Wound #4 status is Open. Original cause of wound was Gradually Appeared. The wound is located on the Right,Distal Lower Leg. The wound measures 0.3cm length x 0.3cm width x 0.1cm depth; 0.071cm^2 area and 0.007cm^3 volume. The wound is limited to skin breakdown. There is no tunneling or undermining noted. There is a large amount of serous drainage noted. The wound margin is flat and intact. There is no granulation within the wound bed. There is a large (67-100%) amount of necrotic tissue within the wound bed including Eschar and Adherent Slough. The periwound skin appearance exhibited: Localized Edema, Moist. The periwound skin appearance did not exhibit: Callus, Crepitus, Excoriation, Fluctuance, Friable, Induration, Rash, Scarring, Dry/Scaly, Maceration, Atrophie Blanche, Cyanosis, Ecchymosis, Hemosiderin Staining, Mottled, Pallor, Rubor, Erythema. Periwound temperature was noted as No Abnormality. The periwound has tenderness on palpation. Assessment Active Problems ICD-10 E11.622 - Type 2 diabetes mellitus with other skin ulcer E66.01 - Morbid (severe) obesity due to excess calories [name], [name] ([id number]) L97.212 - Non-pressure chronic ulcer of right calf with fat layer exposed I89.0 - Lymphedema, not elsewhere classified I50.40 - Unspecified combined systolic (congestive) and diastolic (congestive) heart failure F17.218 - Nicotine dependence, cigarettes, with other nicotine-induced disorders I83.012 - 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 Plan Wound Cleansing: Wound #2 Right,Lateral,Superior Lower Leg: Clean wound with Normal Saline. May Shower, gently pat wound dry prior to applying new dressing. Wound #3 Right,Medial Lower  Leg: Clean wound with Normal Saline. May Shower, gently pat wound dry prior to applying new dressing. Wound #4 Right,Distal Lower Leg: Clean wound with Normal Saline. May Shower, gently pat wound dry prior to applying new dressing. Anesthetic: Wound #2 Right,Lateral,Superior Lower Leg: Topical Lidocaine 4% cream applied to wound bed prior to debridement Wound #3 Right,Medial Lower Leg: Topical Lidocaine 4% cream applied to wound bed prior to debridement Wound #4 Right,Distal Lower Leg: Topical Lidocaine 4% cream applied to wound bed prior to debridement Secondary Dressing: Wound #2 Right,Lateral,Superior Lower Leg: Boardered Foam Dressing Wound #3 Right,Medial Lower Leg: Boardered Foam Dressing Wound #4 Right,Distal Lower Leg: Boardered Foam Dressing Dressing Change Frequency: Wound #2 Right,Lateral,Superior Lower Leg: Change dressing every day. Wound #3 Right,Medial Lower Leg: Change dressing  every day. Wound #4 Right,Distal Lower Leg: Change dressing every day. Follow-up Appointments: Wound #2 Right,Lateral,Superior Lower Leg: Return Appointment in 1 week. Wound #3 Right,Medial Lower Leg: Return Appointment in 1 week. Wound #4 Right,Distal Lower Leg: [name], [name] ([id number]) Return Appointment in 1 week. Edema Control: Wound #2 Right,Lateral,Superior Lower Leg: Elevate legs to the level of the heart and pump ankles as often as possible Other: - wear compression socks Wound #3 Right,Medial Lower Leg: Elevate legs to the level of the heart and pump ankles as often as possible Other: - wear compression socks Wound #4 Right,Distal Lower Leg: Elevate legs to the level of the heart and pump ankles as often as possible Other: - wear compression socks Elevation and exercise and being compliant has been discussed with him and he agrees. I have asked him to apply a border to look and see if he has significant drainage from the superior wound. Continues to use his  compression stockings of 20-30 mm variety I will also asked him to be very careful about his salt intake and watch the labels on food products. Come back and see me next week. Electronic Signature(s) Signed: 04/08/2015 8:41:47 AM By: Christin Fudge MD, FACS Entered By: Christin Fudge on 04/08/2015 08:41:46 [name] ([id number]) -------------------------------------------------------------------------------- [place] Details Patient Name: [name] Date of Service: 04/08/2015 Medical Record Number: [id number] Patient Account Number: [id number] Date of Birth/Sex: [birth year] (49 y.o. 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 I89.0 Lymphedema, not elsewhere classified I50.40 Unspecified combined systolic (congestive) and diastolic (congestive) heart failure F17.218 Nicotine dependence, cigarettes, with other nicotine-induced disorders 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 Facility Procedures CPT4 Code: [id number] Description: 99214 - WOUND CARE VISIT-LEV 4 EST PT Modifier: Quantity: 1 Physician Procedures CPT4 Code: [id number] Description: [id number] - WC PHYS LEVEL 3 - EST PT ICD-10 Description Diagnosis E11.622 Type 2 diabetes mellitus with other skin ulcer L97.212 Non-pressure chronic ulcer of right calf with fat I89.0 Lymphedema, not elsewhere classified I83.012 Varicose veins  of right lower extremity with ulce Modifier: layer exposed r of calf Quantity: 1 Electronic Signature(s) Signed: 04/10/2015 4:27:39 PM By: Christin Fudge MD, FACS Signed: 04/10/2015 4:57:15 PM By:  Alric Quan Previous Signature: 04/08/2015 8:42:01 AM Version By: Christin Fudge MD, FACS Entered By: Alric Quan on 04/08/2015 16:11:44

## 2015-04-14 ENCOUNTER — Other Ambulatory Visit: Payer: Self-pay | Admitting: Family Medicine

## 2015-04-15 ENCOUNTER — Encounter: Payer: Managed Care, Other (non HMO) | Admitting: Surgery

## 2015-04-15 DIAGNOSIS — E11622 Type 2 diabetes mellitus with other skin ulcer: Secondary | ICD-10-CM | POA: Diagnosis not present

## 2015-04-15 NOTE — Telephone Encounter (Signed)
Patient has been dismissed from practice, but I'll approved medicine, within 30 days of care

## 2015-04-16 NOTE — Progress Notes (Addendum)
TEMUJIN, CAPTAIN ([id number]) Visit Report for 04/15/2015 Chief Complaint Document Details Patient Name: [name], [name] Date of Service: 04/15/2015 8:45 AM Medical Record Number: [id number] Patient Account Number: [id number] Date of Birth/Sex: 1965-10-20 (50 y.o. Male) Treating RN: Macarthur Critchley Primary Care Physician: [name], [facility] Other Clinician: Referring Physician: [name], MELINDA Treating Physician/Extender: Frann Rider in Treatment: 14 Information Obtained from: Patient Chief Complaint Patients presents for treatment of an open diabetic ulcer over the right lower extremity with swelling of both legs for about 1 month. Electronic Signature(s) Signed: 04/15/2015 9:01:11 AM By: Christin Fudge MD, FACS Entered By: Christin Fudge on 04/15/2015 09:01:11 [name] ([id number]) -------------------------------------------------------------------------------- HPI Details Patient Name: [name] Date of Service: 04/15/2015 8:45 AM Medical Record Number: [id number] Patient Account Number: [id number] Date of Birth/Sex: Jan 05, 1966 (50 y.o. Male) Treating RN: Macarthur Critchley Primary Care Physician: [name], [place] Other Clinician: Referring Physician: [name], MELINDA Treating Physician/Extender: Frann Rider in Treatment: 14 History of Present Illness Location: Started getting pustules and boils on his right lower extremity along with swelling and was treated for this Quality: Patient reports experiencing a dull pain to affected area(s). Severity: Patient states wound are getting worse. Duration: Patient has had the wound for < 4 weeks prior to presenting for treatment Timing: Pain in wound is Intermittent (comes and goes Context: The wound appeared gradually over time Modifying Factors: Other treatment(s) tried include: local care and antibiotics and some compression stockings. Associated Signs and Symptoms: Patient reports having difficulty standing for  long periods. HPI Description: 50 year old gentleman who has type 2 diabetes mellitus and morbid obesity has been having cellulitis and abscess of the right lower leg for the last 3 weeks. He has had multiple antibiotics and an IandD and over the last 10 days has been on Septra DS and doxycycline and prior to that Keflex. the patient is a poor historian and does not give a definite timeline but says he's been diagnosed with diabetes mellitus for about 8-10 months and has been on insulin. He does not measure his blood sugars regularly nor does he see his PCP regularly. Also has congestive heart failure for which he sees a cardiologist at [facility] but this is going to change as the cardiologist as retired. He is a smoker and smokes several cigars a day and drinks alcohol occasionally. He has been morbidly obese for several years. 01/08/2015 -- the patient complains about having to use his 3 layer compression wrap and has multiple issues with this none of them being medically oriented. He also continues to smoke, has not yet seen his cardiologist or his PCP and in general has continued to not look after himself. 01/15/2015 -- the patient did not allow [place] to put his 3 layer compression wrap last week and has been using his own compression stockings. Continues to smoke. He did see his cardiologist and they have increased his dose of Lasix, asked him to repeat an echo, lose weight and be compliant with his diabetic control. 01/29/2015 -- he has given up smoking for about the last week. He is unable to keep his appointment on 01/30/2015 with the vascular surgeons and has rescheduled it for next week. 02/12/2015 -- he continues to have a lot of edema and drains from the wounds. However he is not agreeable to wear 3 or 4 layer compression wraps. He wants to wear his 20-30 mm compression stockings only. 02/19/2015 -- he was seen by the vascular surgeon Dr. Delana Meyer on 02/18/2015 and a  duplex  ultrasound of the lower extremities shows normal deep venous system, superficial reflux is not present in the great saphenous veins bilaterally and the left small saphenous vein. There is reflux in the small saphenous vein on the right and there are several sporadic varicosities and the distal thigh on the right. He was not offered surgery but graduated compression stockings class I were recommended on a daily basis and was given a prescription for this. He would follow-up with them in 3 months for possible using of lymph pumps at that ANJEL, GRAUL. ([id number]) time. 03/25/2015 -- he has been taking his diuretic regularly and uses his compression stockings as prescribed. He has become much more compliant recently. Electronic Signature(s) Signed: 04/15/2015 9:01:22 AM By: Christin Fudge MD, FACS Entered By: Christin Fudge on 04/15/2015 09:01:22 [name] ([id number]) -------------------------------------------------------------------------------- Physical Exam Details Patient Name: [name] Date of Service: 04/15/2015 8:45 AM Medical Record Number: [id number] Patient Account Number: [id number] Date of Birth/Sex: 1965/05/13 (50 y.o. Male) Treating RN: Macarthur Critchley Primary Care Physician: [name], [place] Other Clinician: Referring Physician: [name], MELINDA Treating Physician/Extender: Frann Rider in Treatment: 14 Constitutional . Pulse regular. Respirations normal and unlabored. Afebrile. . Eyes Nonicteric. Reactive to light. Ears, Nose, Mouth, and Throat Lips, teeth, and gums WNL.[name] Kitchen Moist mucosa without lesions. Neck supple and nontender. No palpable supraclavicular or cervical adenopathy. Normal sized without goiter. Respiratory WNL. No retractions.. Cardiovascular Pedal Pulses WNL. No clubbing, cyanosis or edema. Lymphatic No adneopathy. No adenopathy. No adenopathy. Musculoskeletal Adexa without tenderness or enlargement.. Digits and nails w/o  clubbing, cyanosis, infection, petechiae, ischemia, or inflammatory conditions.. Integumentary (Hair, Skin) No suspicious lesions. No crepitus or fluctuance. No peri-wound warmth or erythema. No masses.[name] Kitchen Psychiatric Judgement and insight Intact.. No evidence of depression, anxiety, or agitation.. Notes the patient's wounds have completely healed and there are no open ulcerations. His edema is much better and there is no evidence of any superficial excoriation or weeping. Electronic Signature(s) Signed: 04/15/2015 9:01:58 AM By: Christin Fudge MD, FACS Entered By: Christin Fudge on 04/15/2015 09:01:57 [name] ([id number]) -------------------------------------------------------------------------------- Physician Orders Details Patient Name: [name] Date of Service: 04/15/2015 8:45 AM Medical Record Number: [id number] Patient Account Number: [id number] Date of Birth/Sex: [birth year] (50 y.o. Male) Treating RN: Macarthur Critchley Primary Care Physician: [name], [place] Other Clinician: Referring Physician: [name], MELINDA Treating Physician/Extender: Frann Rider in Treatment: 61 Verbal / Phone Orders: Yes Clinician: Macarthur Critchley Read Back and Verified: Yes Diagnosis Coding Wound Cleansing Wound #2 Right,Lateral,Superior Lower Leg o Cleanse wound with mild soap and water o May Shower, gently pat wound dry prior to applying new dressing. Wound #3 Right,Medial Lower Leg o Cleanse wound with mild soap and water o May Shower, gently pat wound dry prior to applying new dressing. Wound #4 Right,Distal Lower Leg o Cleanse wound with mild soap and water o May Shower, gently pat wound dry prior to applying new dressing. Secondary Dressing Wound #2 Right,Lateral,Superior Lower Leg o Boardered Foam Dressing - cover with band aid Edema Control Wound #2 Right,Lateral,Superior Lower Leg o Elevate legs to the level of the heart and pump ankles as often as  possible o Other: - wear compression socks Wound #3 Right,Medial Lower Leg o Elevate legs to the level of the heart and pump ankles as often as possible o Other: - wear compression socks Wound #4 Right,Distal Lower Leg o Elevate legs to the level of the heart and pump ankles as often as possible o Other: -  wear compression socks Discharge From [facility] Wound #2 Right,Lateral,Superior Lower Leg o Discharge from [place] - healed, follow up if needed Wound #3 Right,Medial Lower Leg o Discharge from [place] - healed, follow up if needed KYSHON, STEIMER ([id number]) Wound #4 Right,Distal Lower Leg o Discharge from [place] - healed, follow up if needed Electronic Signature(s) Signed: 04/15/2015 4:16:25 PM By: Ardean Larsen Signed: 04/15/2015 4:23:25 PM By: Christin Fudge MD, FACS Entered By: Rebecca Eaton RN, [place] on 04/15/2015 08:59:11 [name] ([id number]) -------------------------------------------------------------------------------- Problem List Details Patient Name: [name] Date of Service: 04/15/2015 8:45 AM Medical Record Number: [id number] Patient Account Number: [id number] Date of Birth/Sex: October 20, 1965 (50 y.o. Male) Treating RN: Macarthur Critchley Primary Care Physician: [name], [place] Other Clinician: Referring Physician: [name], MELINDA Treating Physician/Extender: Frann Rider in Treatment: 14 Active Problems ICD-10 Encounter Code Description Active Date Diagnosis E11.622 Type 2 diabetes mellitus with other skin ulcer 01/01/2015 Yes E66.01 Morbid (severe) obesity due to excess calories 01/01/2015 Yes L97.212 Non-pressure chronic ulcer of right calf with fat layer 02/05/2015 Yes exposed I89.0 Lymphedema, not elsewhere classified 01/01/2015 Yes I50.40 Unspecified combined systolic (congestive) and diastolic 99991111 Yes (congestive) heart failure F17.218 Nicotine dependence, cigarettes, with  other nicotine- 01/01/2015 Yes induced disorders 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 02/19/2015 Yes Inactive Problems Resolved Problems ICD-10 Code Description Active Date Resolved Date L02.415 Cutaneous abscess of right lower limb 01/01/2015 01/01/2015 [name] ([id number]) Electronic Signature(s) Signed: 04/15/2015 9:01:04 AM By: Christin Fudge MD, FACS Entered By: Christin Fudge on 04/15/2015 09:01:04 [name] ([id number]) -------------------------------------------------------------------------------- Progress Note Details Patient Name: [name] Date of Service: 04/15/2015 8:45 AM Medical Record Number: [id number] Patient Account Number: [id number] Date of Birth/Sex: 1965/09/26 (50 y.o. Male) Treating RN: Macarthur Critchley Primary Care Physician: [name], [place] Other Clinician: Referring Physician: [name], MELINDA Treating Physician/Extender: Frann Rider in Treatment: 14 Subjective Chief Complaint Information obtained from Patient Patients presents for treatment of an open diabetic ulcer over the right lower extremity with swelling of both legs for about 1 month. History of Present Illness (HPI) The following HPI elements were documented for the patient's wound: Location: Started getting pustules and boils on his right lower extremity along with swelling and was treated for this Quality: Patient reports experiencing a dull pain to affected area(s). Severity: Patient states wound are getting worse. Duration: Patient has had the wound for < 4 weeks prior to presenting for treatment Timing: Pain in wound is Intermittent (comes and goes Context: The wound appeared gradually over time Modifying Factors: Other treatment(s) tried include: local care and antibiotics and some compression stockings. Associated Signs and Symptoms: Patient reports having difficulty standing for long periods. 50 year old gentleman who has type 2  diabetes mellitus and morbid obesity has been having cellulitis and abscess of the right lower leg for the last 3 weeks. He has had multiple antibiotics and an IandD and over the last 10 days has been on Septra DS and doxycycline and prior to that Keflex. the patient is a poor historian and does not give a definite timeline but says he's been diagnosed with diabetes mellitus for about 8-10 months and has been on insulin. He does not measure his blood sugars regularly nor does he see his PCP regularly. Also has congestive heart failure for which he sees a cardiologist at [facility] but this is going to change as the cardiologist as retired. He is a smoker and smokes  several cigars a day and drinks alcohol occasionally. He has been morbidly obese for several years. 01/08/2015 -- the patient complains about having to use his 3 layer compression wrap and has multiple issues with this none of them being medically oriented. He also continues to smoke, has not yet seen his cardiologist or his PCP and in general has continued to not look after himself. 01/15/2015 -- the patient did not allow [place] to put his 3 layer compression wrap last week and has been using his own compression stockings. Continues to smoke. He did see his cardiologist and they have increased his dose of Lasix, asked him to repeat an echo, lose weight and be compliant with his diabetic control. 01/29/2015 -- he has given up smoking for about the last week. He is unable to keep his appointment on 01/30/2015 with the vascular surgeons and has rescheduled it for next week. 02/12/2015 -- he continues to have a lot of edema and drains from the wounds. However he is not AMRAN, MARINE. ([id number]) agreeable to wear 3 or 4 layer compression wraps. He wants to wear his 20-30 mm compression stockings only. 02/19/2015 -- he was seen by the vascular surgeon Dr. Delana Meyer on 02/18/2015 and a duplex ultrasound of the lower extremities shows  normal deep venous system, superficial reflux is not present in the great saphenous veins bilaterally and the left small saphenous vein. There is reflux in the small saphenous vein on the right and there are several sporadic varicosities and the distal thigh on the right. He was not offered surgery but graduated compression stockings class I were recommended on a daily basis and was given a prescription for this. He would follow-up with them in 3 months for possible using of lymph pumps at that time. 03/25/2015 -- he has been taking his diuretic regularly and uses his compression stockings as prescribed. He has become much more compliant recently. Objective Constitutional Pulse regular. Respirations normal and unlabored. Afebrile. Vitals Time Taken: 8:46 AM, Height: 71 in, Weight: 360 lbs, BMI: 50.2, Temperature: 98.4 F, Pulse: 91 bpm, Blood Pressure: 150/100 mmHg. Eyes Nonicteric. Reactive to light. Ears, Nose, Mouth, and Throat Lips, teeth, and gums WNL.[name] Kitchen Moist mucosa without lesions. Neck supple and nontender. No palpable supraclavicular or cervical adenopathy. Normal sized without goiter. Respiratory WNL. No retractions.. Cardiovascular Pedal Pulses WNL. No clubbing, cyanosis or edema. Lymphatic No adneopathy. No adenopathy. No adenopathy. Musculoskeletal Adexa without tenderness or enlargement.. Digits and nails w/o clubbing, cyanosis, infection, petechiae, ischemia, or inflammatory conditions.[name] Kitchen Psychiatric GYASI, COVER ([id number]) Judgement and insight Intact.. No evidence of depression, anxiety, or agitation.. General Notes: the patient's wounds have completely healed and there are no open ulcerations. His edema is much better and there is no evidence of any superficial excoriation or weeping. Integumentary (Hair, Skin) No suspicious lesions. No crepitus or fluctuance. No peri-wound warmth or erythema. No masses.. Wound #2 status is Healed - Epithelialized. Original  cause of wound was Gradually Appeared. The wound is located on the Right,Lateral,Superior Lower Leg. The wound measures 0cm length x 0cm width x 0cm depth; 0cm^2 area and 0cm^3 volume. Wound #3 status is Healed - Epithelialized. Original cause of wound was Gradually Appeared. The wound is located on the Right,Medial Lower Leg. The wound measures 0cm length x 0cm width x 0cm depth; 0cm^2 area and 0cm^3 volume. Assessment Active Problems ICD-10 E11.622 - Type 2 diabetes mellitus with other skin ulcer E66.01 - Morbid (severe) obesity due to excess calories L97.212 - Non-pressure chronic  ulcer of right calf with fat layer exposed I89.0 - Lymphedema, not elsewhere classified I50.40 - Unspecified combined systolic (congestive) and diastolic (congestive) heart failure F17.218 - Nicotine dependence, cigarettes, with other nicotine-induced disorders I83.012 - 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 having completely healed the patient's wounds I have recommended: 1. He continue to wear his compression stockings of the 20-30 mm variety daily all day except for the night 2. Continue to watch his salt intake and manage his diuretics with his PCP 3. See the vascular surgeons for follow-up regarding possibility of lymph palms been ordered 4. He is discharged on the wound care services and be seen back as needed. Plan Wound Cleansing: Wound #2 Right,Lateral,Superior Lower Leg: Cleanse wound with mild soap and water VAIDEN, WEE ([id number]) May Shower, gently pat wound dry prior to applying new dressing. Wound #3 Right,Medial Lower Leg: Cleanse wound with mild soap and water May Shower, gently pat wound dry prior to applying new dressing. Wound #4 Right,Distal Lower Leg: Cleanse wound with mild soap and water May Shower, gently pat wound dry prior to applying new dressing. Secondary Dressing: Wound #2 Right,Lateral,Superior Lower Leg: Boardered Foam Dressing - cover with  band aid Edema Control: Wound #2 Right,Lateral,Superior Lower Leg: Elevate legs to the level of the heart and pump ankles as often as possible Other: - wear compression socks Wound #3 Right,Medial Lower Leg: Elevate legs to the level of the heart and pump ankles as often as possible Other: - wear compression socks Wound #4 Right,Distal Lower Leg: Elevate legs to the level of the heart and pump ankles as often as possible Other: - wear compression socks Discharge From [facility] Services: Wound #2 Right,Lateral,Superior Lower Leg: Discharge from [place] - healed, follow up if needed Wound #3 Right,Medial Lower Leg: Discharge from [place] - healed, follow up if needed Wound #4 Right,Distal Lower Leg: Discharge from [place] - healed, follow up if needed having completely healed the patient's wounds I have recommended: 1. He continue to wear his compression stockings of the 20-30 mm variety daily all day except for the night 2. Continue to watch his salt intake and manage his diuretics with his PCP 3. See the vascular surgeons for follow-up regarding possibility of lymph palms been ordered 4. He is discharged on the wound care services and be seen back as needed. Electronic Signature(s) Signed: 04/15/2015 4:24:35 PM By: Christin Fudge MD, FACS Previous Signature: 04/15/2015 9:02:59 AM Version By: Christin Fudge MD, FACS Entered By: Christin Fudge on 04/15/2015 16:24:35 [name] ([id number]) -------------------------------------------------------------------------------- SuperBill Details Patient Name: [name] Date of Service: 04/15/2015 Medical Record Number: [id number] Patient Account Number: [id number] Date of Birth/Sex: 1965/02/06 (50 y.o. Male) Treating RN: Macarthur Critchley Primary Care Physician: [name], [place] Other Clinician: Referring Physician: [name], MELINDA Treating Physician/Extender: Frann Rider in Treatment: 14 Diagnosis  Coding ICD-10 Codes Code Description E11.622 Type 2 diabetes mellitus with other skin ulcer E66.01 Morbid (severe) obesity due to excess calories L97.212 Non-pressure chronic ulcer of right calf with fat layer exposed I89.0 Lymphedema, not elsewhere classified I50.40 Unspecified combined systolic (congestive) and diastolic (congestive) heart failure F17.218 Nicotine dependence, cigarettes, with other nicotine-induced disorders 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 Facility Procedures CPT4 Code: [id number] Description: 99214 - WOUND CARE VISIT-LEV 4 EST PT Modifier: Quantity: 1 Physician Procedures CPT4 Code: [id number] Description: 99213 - WC PHYS LEVEL 3 - EST PT ICD-10 Description Diagnosis E11.622 Type  2 diabetes mellitus with other skin ulcer E66.01 Morbid (severe) obesity due to excess calories I89.0 Lymphedema, not elsewhere classified I83.012 Varicose veins of  right lower extremity with ulcer Modifier: of calf Quantity: 1 Electronic Signature(s) Signed: 04/15/2015 4:16:25 PM By: Ardean Larsen Signed: 04/15/2015 4:23:25 PM By: Christin Fudge MD, FACS Previous Signature: 04/15/2015 9:03:22 AM Version By: Christin Fudge MD, FACS Entered By: Rebecca Eaton RN, [place] on 04/15/2015 09:14:30

## 2015-04-16 NOTE — Progress Notes (Signed)
[name], [name] ([id number]) Visit Report for 04/15/2015 Arrival Information Details Patient Name: [name], [name] Date of Service: 04/15/2015 8:45 AM Medical Record Number: [id number] Patient Account Number: [id number] Date of Birth/Sex: [birth year] (49 y.o. Male) Treating RN: Macarthur Critchley Primary Care Physician: [name], [place] Other Clinician: Referring Physician: [name], [place] Treating Physician/Extender: Frann Rider in Treatment: 14 Visit Information History Since Last Visit All ordered tests and consults were completed: No Patient Arrived: Ambulatory Added or deleted any medications: No Arrival Time: 08:45 Any new allergies or adverse reactions: No Accompanied By: sef Had a fall or experienced change in No Transfer Assistance: None activities of daily living that may affect Patient Identification Verified: Yes risk of falls: Secondary Verification Process Yes Signs or symptoms of abuse/neglect since last No Completed: visito Patient Requires Transmission-Based No Hospitalized since last visit: No Precautions: Has Dressing in Place as Prescribed: Yes Patient Has Alerts: Yes Pain Present Now: No Patient Alerts: DM II Electronic Signature(s) Signed: 04/15/2015 4:16:25 PM By: Rebecca Eaton, RN, Sendra Entered By: Rebecca Eaton RN, Sendra on 04/15/2015 08:45:48 [name] ([id number]) -------------------------------------------------------------------------------- Clinic Level of Care Assessment Details Patient Name: [name] Date of Service: 04/15/2015 8:45 AM Medical Record Number: [id number] Patient Account Number: [id number] Date of Birth/Sex: 1965-03-09 (49 y.o. Male) Treating RN: Macarthur Critchley Primary Care Physician: [name], [place] Other Clinician: Referring Physician: [name], MELINDA Treating Physician/Extender: Frann Rider in Treatment: 14 Clinic Level of Care Assessment Items TOOL 4 Quantity Score X - Use when only an EandM is  performed on FOLLOW-UP visit 1 0 ASSESSMENTS - Nursing Assessment / Reassessment X - Reassessment of Co-morbidities (includes updates in patient status) 1 10 X - Reassessment of Adherence to Treatment Plan 1 5 ASSESSMENTS - Wound and Skin Assessment / Reassessment []  - Simple Wound Assessment / Reassessment - one wound 0 X - Complex Wound Assessment / Reassessment - multiple wounds 3 5 []  - Dermatologic / Skin Assessment (not related to wound area) 0 ASSESSMENTS - Focused Assessment X - Circumferential Edema Measurements - multi extremities 1 5 []  - Nutritional Assessment / Counseling / Intervention 0 X - Lower Extremity Assessment (monofilament, tuning fork, pulses) 1 5 []  - Peripheral Arterial Disease Assessment (using hand held doppler) 0 ASSESSMENTS - Ostomy and/or Continence Assessment and Care []  - Incontinence Assessment and Management 0 []  - Ostomy Care Assessment and Management (repouching, etc.) 0 PROCESS - Coordination of Care X - Simple Patient / Family Education for ongoing care 1 15 []  - Complex (extensive) Patient / Family Education for ongoing care 0 X - Staff obtains Programmer, systems, Records, Test Results / Process Orders 1 10 []  - Staff telephones HHA, Nursing Homes / Clarify orders / etc 0 []  - Routine Transfer to another Facility (non-emergent condition) 0 [name], [name] ([id number]) []  - Routine Hospital Admission (non-emergent condition) 0 []  - New Admissions / Biomedical engineer / Ordering NPWT, Apligraf, etc. 0 []  - Emergency Hospital Admission (emergent condition) 0 X - Simple Discharge Coordination 1 10 []  - Complex (extensive) Discharge Coordination 0 PROCESS - Special Needs []  - Pediatric / Minor Patient Management 0 []  - Isolation Patient Management 0 []  - Hearing / Language / Visual special needs 0 []  - Assessment of Community assistance (transportation, D/C planning, etc.) 0 []  - Additional assistance / Altered mentation 0 []  - Support Surface(s)  Assessment (bed, cushion, seat, etc.) 0 INTERVENTIONS - Wound Cleansing / Measurement []  - Simple Wound Cleansing - one wound 0 X - Complex Wound Cleansing - multiple  wounds 3 5 X - Wound Imaging (photographs - any number of wounds) 1 5 []  - Wound Tracing (instead of photographs) 0 []  - Simple Wound Measurement - one wound 0 X - Complex Wound Measurement - multiple wounds 3 5 INTERVENTIONS - Wound Dressings X - Small Wound Dressing one or multiple wounds 3 10 []  - Medium Wound Dressing one or multiple wounds 0 []  - Large Wound Dressing one or multiple wounds 0 []  - Application of Medications - topical 0 []  - Application of Medications - injection 0 INTERVENTIONS - Miscellaneous []  - External ear exam 0 [name], [name] ([id number]) []  - Specimen Collection (cultures, biopsies, blood, body fluids, etc.) 0 []  - Specimen(s) / Culture(s) sent or taken to Lab for analysis 0 []  - Patient Transfer (multiple staff / Harrel Lemon Lift / Similar devices) 0 []  - Simple Staple / Suture removal (25 or less) 0 []  - Complex Staple / Suture removal (26 or more) 0 []  - Hypo / Hyperglycemic Management (close monitor of Blood Glucose) 0 []  - Ankle / Brachial Index (ABI) - do not check if billed separately 0 X - Vital Signs 1 5 Has the patient been seen at the hospital within the last three years: Yes Total Score: 145 Level Of Care: New/Established - Level 4 Electronic Signature(s) Signed: 04/15/2015 4:16:25 PM By: Rebecca Eaton, RN, Sendra Entered By: Rebecca Eaton, RN, Sendra on 04/15/2015 09:14:21 [name] ([id number]) -------------------------------------------------------------------------------- Encounter Discharge Information Details Patient Name: [name] Date of Service: 04/15/2015 8:45 AM Medical Record Number: [id number] Patient Account Number: [id number] Date of Birth/Sex: Jan 07, 1966 (49 y.o. Male) Treating RN: Macarthur Critchley Primary Care Physician: [name], [place] Other  Clinician: Referring Physician: [name], MELINDA Treating Physician/Extender: Frann Rider in Treatment: 14 Encounter Discharge Information Items Discharge Pain Level: 0 Discharge Condition: Stable Ambulatory Status: Ambulatory Discharge Destination: Home Transportation: Private Auto Accompanied By: self Schedule Follow-up Appointment: No Medication Reconciliation completed and provided to Patient/Care Yes [name]: Provided on Clinical Summary of Care: 04/15/2015 Form Type Recipient Paper Patient RT Notes pt discharged Electronic Signature(s) Signed: 04/15/2015 4:16:25 PM By: Rebecca Eaton RN, Sendra Previous Signature: 04/15/2015 9:04:00 AM Version By: Ruthine Dose Entered By: Rebecca Eaton RN, Sendra on 04/15/2015 09:04:36 [name] ([id number]) -------------------------------------------------------------------------------- Lower Extremity Assessment Details Patient Name: [name] Date of Service: 04/15/2015 8:45 AM Medical Record Number: [id number] Patient Account Number: [id number] Date of Birth/Sex: 1965-11-06 (49 y.o. Male) Treating RN: Macarthur Critchley Primary Care Physician: [name], [place] Other Clinician: Referring Physician: [name], MELINDA Treating Physician/Extender: Frann Rider in Treatment: 14 Edema Assessment Assessed: [Left: No] [Right: No] E[Left: dema] [Right: :] Calf Left: Right: Point of Measurement: cm From Medial Instep cm 50 cm Ankle Left: Right: Point of Measurement: cm From Medial Instep cm 31.5 cm Vascular Assessment Pulses: Posterior Tibial Dorsalis Pedis Palpable: [Right:Yes] Extremity colors, hair growth, and conditions: Extremity Color: [Right:Mottled] Hair Growth on Extremity: [Right:No] Temperature of Extremity: [Right:Warm] Capillary Refill: [Right:< 3 seconds] Toe Nail Assessment Left: Right: Thick: No Discolored: No Deformed: No Improper Length and Hygiene: No Electronic Signature(s) Signed: 04/15/2015  4:16:25 PM By: Rebecca Eaton, RN, Sendra Entered By: Rebecca Eaton RN, Sendra on 04/15/2015 08:53:47 [name] ([id number]) -------------------------------------------------------------------------------- Pain Assessment Details Patient Name: [name] Date of Service: 04/15/2015 8:45 AM Medical Record Number: [id number] Patient Account Number: [id number] Date of Birth/Sex: 05-May-1965 (50 y.o. Male) Treating RN: Macarthur Critchley Primary Care Physician: [name], [place] Other Clinician: Referring Physician: [name], MELINDA Treating Physician/Extender: Frann Rider in Treatment: 14 Active Problems Location  of Pain Severity and Description of Pain Patient Has Paino No Site Locations Pain Management and Medication Current Pain Management: Electronic Signature(s) Signed: 04/15/2015 4:16:25 PM By: Rebecca Eaton, RN, Sendra Entered By: Rebecca Eaton RN, Sendra on 04/15/2015 08:45:56 [name] ([id number]) -------------------------------------------------------------------------------- Patient/Caregiver Education Details Patient Name: [name] Date of Service: 04/15/2015 8:45 AM Medical Record Number: [id number] Patient Account Number: [id number] Date of Birth/Gender: November 14, 1965 (49 y.o. Male) Treating RN: Macarthur Critchley Primary Care Physician: [name], [place] Other Clinician: Referring Physician: [name], [place] Treating Physician/Extender: Frann Rider in Treatment: 14 Education Assessment Education Provided To: Patient Education Topics Provided Venous: Handouts: Controlling Swelling with Compression Stockings Methods: Explain/Verbal Responses: State content correctly Electronic Signature(s) Signed: 04/15/2015 4:16:25 PM By: Rebecca Eaton, RN, Sendra Entered By: Rebecca Eaton, RN, Sendra on 04/15/2015 09:04:55 [name] ([id number]) -------------------------------------------------------------------------------- Wound Assessment Details Patient Name:  [name] Date of Service: 04/15/2015 8:45 AM Medical Record Number: [id number] Patient Account Number: [id number] Date of Birth/Sex: 1965/12/05 (50 y.o. Male) Treating RN: Macarthur Critchley Primary Care Physician: [name], [place] Other Clinician: Referring Physician: [name], MELINDA Treating Physician/Extender: Frann Rider in Treatment: 14 Wound Status Wound Number: 2 Primary Etiology: Venous Leg Ulcer Wound Location: Right, Lateral, Superior Lower Wound Status: Healed - Epithelialized Leg Wounding Event: Gradually Appeared Date Acquired: 12/13/2014 Weeks Of Treatment: 14 Clustered Wound: No Photos Photo Uploaded By: Rebecca Eaton, RN, Roslynn Amble on 04/15/2015 13:51:02 Wound Measurements Length: (cm) 0 % Reducti Width: (cm) 0 % Reducti Depth: (cm) 0 Area: (cm) 0 Volume: (cm) 0 on in Area: 100% on in Volume: 100% Wound Description Full Thickness Without Exposed Classification: Support Structures Periwound Skin Texture Texture Color No Abnormalities Noted: No No Abnormalities Noted: No Moisture No Abnormalities Noted: No Treatment Notes [name], [name]. ([id number]) Wound #2 (Right, Lateral, Superior Lower Leg) 1. Cleansed with: Clean wound with Normal Saline Notes covered all areas with band aid Electronic Signature(s) Signed: 04/15/2015 4:16:25 PM By: Rebecca Eaton, RN, Sendra Entered By: Rebecca Eaton, RN, Sendra on 04/15/2015 09:03:41 [name] ([id number]) -------------------------------------------------------------------------------- Wound Assessment Details Patient Name: [name] Date of Service: 04/15/2015 8:45 AM Medical Record Number: [id number] Patient Account Number: [id number] Date of Birth/Sex: 12/18/1965 (49 y.o. Male) Treating RN: Macarthur Critchley Primary Care Physician: [name], [place] Other Clinician: Referring Physician: [name], MELINDA Treating Physician/Extender: Frann Rider in Treatment: 14 Wound Status Wound Number:  3 Primary Etiology: Venous Leg Ulcer Wound Location: Right, Medial Lower Leg Wound Status: Healed - Epithelialized Wounding Event: Gradually Appeared Date Acquired: 12/13/2014 Weeks Of Treatment: 14 Clustered Wound: No Photos Photo Uploaded By: Rebecca Eaton, RN, Roslynn Amble on 04/15/2015 13:51:19 Wound Measurements Length: (cm) 0 % Reducti Width: (cm) 0 % Reducti Depth: (cm) 0 Area: (cm) 0 Volume: (cm) 0 on in Area: 100% on in Volume: 100% Wound Description Full Thickness Without Exposed Classification: Support Structures Periwound Skin Texture Texture Color No Abnormalities Noted: No No Abnormalities Noted: No Moisture No Abnormalities Noted: No Treatment Notes Wound #3 (Right, Medial Lower Leg) [name], [name]. ([id number]) 1. Cleansed with: Clean wound with Normal Saline Notes covered all areas with band aid Electronic Signature(s) Signed: 04/15/2015 4:16:25 PM By: Rebecca Eaton, RN, Sendra Entered By: Rebecca Eaton RN, Sendra on 04/15/2015 09:03:42 [name] ([id number]) -------------------------------------------------------------------------------- [place] Details Patient Name: [name] Date of Service: 04/15/2015 8:45 AM Medical Record Number: [id number] Patient Account Number: [id number] Date of Birth/Sex: 02/10/1965 (50 y.o. Male) Treating RN: Macarthur Critchley Primary Care Physician: [name], [place] Other Clinician: Referring Physician: [name], MELINDA Treating Physician/Extender: Frann Rider in Treatment:  14 Vital Signs Time Taken: 08:46 Temperature (F): 98.4 Height (in): 71 Pulse (bpm): 91 Weight (lbs): 360 Blood Pressure (mmHg): 150/100 Body Mass Index (BMI): 50.2 Reference Range: 80 - 120 mg / dl Electronic Signature(s) Signed: 04/15/2015 4:16:25 PM By: Rebecca Eaton RN, Sendra Entered By: Rebecca Eaton RN, Sendra on 04/15/2015 09:08:01

## 2015-04-17 ENCOUNTER — Telehealth: Payer: Self-pay | Admitting: Family Medicine

## 2015-04-17 NOTE — Telephone Encounter (Signed)
Routing to provider  

## 2015-04-17 NOTE — Telephone Encounter (Signed)
Patient notified

## 2015-04-17 NOTE — Telephone Encounter (Signed)
I advise his to go over this with his new doctor If he's not going to follow my advice and take three times what I recommended, he may hurt himself I strongly recommend that he NEVER increase medicines or supplements on his own

## 2015-04-17 NOTE — Telephone Encounter (Signed)
Pt called and stated that he had tried to take melatonin 3 mg and it didn't seem to do anything so he tried 9 mg and he still stayed up all night. He would like to know if there is anything else he could take or do.

## 2015-04-19 ENCOUNTER — Other Ambulatory Visit: Payer: Self-pay | Admitting: Family Medicine

## 2015-04-20 NOTE — Telephone Encounter (Signed)
He is within the 30 days of care after dismissal; Rx approved

## 2015-05-07 ENCOUNTER — Ambulatory Visit (INDEPENDENT_AMBULATORY_CARE_PROVIDER_SITE_OTHER): Payer: Managed Care, Other (non HMO) | Admitting: Family Medicine

## 2015-05-07 ENCOUNTER — Encounter: Payer: Self-pay | Admitting: Family Medicine

## 2015-05-07 VITALS — BP 158/82 | HR 87 | Temp 98.3°F | Resp 16 | Ht 69.0 in | Wt 396.0 lb

## 2015-05-07 DIAGNOSIS — J3089 Other allergic rhinitis: Secondary | ICD-10-CM | POA: Diagnosis not present

## 2015-05-07 DIAGNOSIS — K219 Gastro-esophageal reflux disease without esophagitis: Secondary | ICD-10-CM | POA: Diagnosis not present

## 2015-05-07 DIAGNOSIS — N183 Chronic kidney disease, stage 3 unspecified: Secondary | ICD-10-CM

## 2015-05-07 DIAGNOSIS — G4719 Other hypersomnia: Secondary | ICD-10-CM | POA: Diagnosis not present

## 2015-05-07 DIAGNOSIS — E1165 Type 2 diabetes mellitus with hyperglycemia: Secondary | ICD-10-CM

## 2015-05-07 DIAGNOSIS — I420 Dilated cardiomyopathy: Secondary | ICD-10-CM | POA: Diagnosis not present

## 2015-05-07 DIAGNOSIS — E1122 Type 2 diabetes mellitus with diabetic chronic kidney disease: Secondary | ICD-10-CM

## 2015-05-07 DIAGNOSIS — R635 Abnormal weight gain: Secondary | ICD-10-CM | POA: Diagnosis not present

## 2015-05-07 DIAGNOSIS — I10 Essential (primary) hypertension: Secondary | ICD-10-CM

## 2015-05-07 DIAGNOSIS — E559 Vitamin D deficiency, unspecified: Secondary | ICD-10-CM

## 2015-05-07 DIAGNOSIS — E785 Hyperlipidemia, unspecified: Secondary | ICD-10-CM

## 2015-05-07 DIAGNOSIS — IMO0002 Reserved for concepts with insufficient information to code with codable children: Secondary | ICD-10-CM

## 2015-05-07 LAB — POCT UA - MICROALBUMIN
Albumin/Creatinine Ratio, Urine, POC: 0
Creatinine, POC: 0 mg/dL
Microalbumin Ur, POC: 100 mg/L

## 2015-05-07 LAB — POCT GLYCOSYLATED HEMOGLOBIN (HGB A1C): Hemoglobin A1C: 7.6

## 2015-05-07 MED ORDER — LISINOPRIL 30 MG PO TABS: 30.0000 mg | ORAL_TABLET | Freq: Every day | ORAL | Status: DC

## 2015-05-07 MED ORDER — FLUTICASONE PROPIONATE 50 MCG/ACT NA SUSP: 2.0000 | Freq: Every day | NASAL | Status: DC

## 2015-05-07 MED ORDER — RANITIDINE HCL 150 MG PO CAPS
150.0000 mg | ORAL_CAPSULE | Freq: Every evening | ORAL | Status: DC
Start: 2015-05-07 — End: 2015-06-05

## 2015-05-07 NOTE — Assessment & Plan Note (Signed)
Recheck vitamin D

## 2015-05-07 NOTE — Assessment & Plan Note (Signed)
Last EF 20-25%. Sees Dr. Cloyd Stagers at [facility]. Takes lasix and spironolactone for diruesis. ACE and betablocker. Pt encouraged to keep appt with Cardiology.

## 2015-05-07 NOTE — Assessment & Plan Note (Signed)
Continue PPI. Add Zantac QPM and up to BID. Consider GI referral for endo if not improving. Recheck 1 mos.

## 2015-05-07 NOTE — Progress Notes (Signed)
Subjective:    Patient ID: [name], male    DOB: 1965-07-25, 50 y.o.   MRN: [id number]  HPI: [name] is a 50 y.o. male presenting on 05/07/2015 for Establish Care   HPI  Pt presents to establish care today. Previous care provider was Dr. Sanda Klein at [facility]  It has been 1 months since His last PCP visit. Records from previous provider will be requested and reviewed. Current medical problems include:  Diabetes: A1c was 9.1% in December- down to 7.6%.  Using Toujeo 16 units once daily. Taking Trulicity once daily. Needs to reschedule Eye exam. UA micro done in December-0 was very high. No numbness or tingling in your feet. Sugars at home average 100-120- checks first thing in the morning before he eats. Exercise- walks with job. No formal exercise program. Typical diet- mostly vegetables and fruit.  Lymphadema and venous ulcer- treated at wound care center. Got a pump lymphadema. Wears compression stockings daily.  Swelling in legs 2/2 CHF.      Obesity- lost 35lbs recently. [facility] about 1 year ago.  Dilated Cardiomyopathy- [facility] cardiology. Dr. Cloyd Stagers. Last EF 20-25%. Taking aldactone as diuresis. Sees cardiology on April 21. On fluid restriction.Taking 30m lasix daily.  Restrictive Lung Disease- Noted per care everywhere per PFTs in 2006. No significant change with bronchodilators. Needs update spirometry.  Hypertension: Elevated today. 158/72. Taking lisinopril 227mdaily, coreg- 5067mID,  Possible sleep apnea/ Excessive Daytime sleepiness. Was scheduled for sleep study but did not go. Has concerns about child care for his children. Neck circumference 16.5 inc. No witnessed apnea. But does snore. Excess daytime sleepiness. Falls asleep frequently at work. Sleeps 4-5 hours per night by his report but his daughter's say he sleeps more. Risk for OSA include obesity, HTN, and snoring and increased neck circumference.  GERD: Taking prilosec 96m22m bed  time. Acid is keeping him awake at night. Has tried eliminating fried foods. No bloody stools. No regurg.  Allergic rhinitis- having issues with post nasal drip- is not taking nasal steroid or OTC anti-histamine. No trouble breathing or shortness of breath. No chest tightness.   Health maintenance:  Pneumovax: 12/2014. No family history of prostate cancer.    Past Medical History  Diagnosis Date  . Diabetes mellitus without complication (HCC)[place]. Hypertension   . Asthma   . Dilated cardiomyopathy (HCC)[place]. Reflux   . Morbid obesity (HCC)[place]. Allergy   . Gout    Social History   Social History  . Marital Status: Single    Spouse Name: N/A  . Number of Children: N/A  . Years of Education: N/A   Occupational History  . Not on file.   Social History Main Topics  . Smoking status: Never Smoker   . Smokeless tobacco: Never Used  . Alcohol Use: Yes     Comment: a glass of wine and a couple beers a day  . Drug Use: No  . Sexual Activity: Not on file   Other Topics Concern  . Not on file   Social History Narrative   Family History  Problem Relation Age of Onset  . Diabetes Mother   . Hypertension Mother   . Alcohol abuse Father   . Glaucoma Father   . Diabetes Father   . Hypertension Father   . Cancer Neg Hx   . COPD Neg Hx   . Heart disease Neg Hx   .  Stroke Neg Hx    Current Outpatient Prescriptions on File Prior to Visit  Medication Sig  . albuterol (VENTOLIN HFA) 108 (90 Base) MCG/ACT inhaler Inhale 2 puffs into the lungs every 4 (four) hours as needed for wheezing or shortness of breath.  . allopurinol (ZYLOPRIM) 300 MG tablet Take 1 tablet (300 mg total) by mouth daily.  [name] Kitchen aspirin 81 MG chewable tablet Chew 81 mg by mouth daily.  . blood glucose meter kit and supplies KIT Brand of choice; LON 99 months; check FSBS 3x a day; E11.65; disp one meter with 100 strips + 1 refill of strips  . carvedilol (COREG) 25 MG tablet Take 25 mg by mouth 2 (two) times daily  with a meal.  . furosemide (LASIX) 40 MG tablet Take 40 mg by mouth 4 (four) times daily. Reported on 01/17/2015  . Insulin Glargine (TOUJEO SOLOSTAR) 300 UNIT/ML SOPN Inject 16 Units into the skin daily.  . Insulin Pen Needle 31G X 8 MM MISC by Does not apply route.  [name] Kitchen omeprazole (PRILOSEC) 40 MG capsule Take 1 capsule (40 mg total) by mouth daily.  [name] Kitchen spironolactone (ALDACTONE) 25 MG tablet Take 25 mg by mouth daily.  . TRULICITY 3.55 DD/2.2GU SOPN Inject 1 pen into the skin once a week.  . vitamin C (ASCORBIC ACID) 500 MG tablet Take 1,000 mg by mouth daily.  . Vitamin D, Ergocalciferol, (DRISDOL) 50000 units CAPS capsule Take 1 capsule (50,000 Units total) by mouth every 30 (thirty) days.  . Zinc 50 MG CAPS Take 50 mg by mouth daily.   No current facility-administered medications on file prior to visit.    Review of Systems  Constitutional: Positive for fatigue. Negative for fever and chills.  HENT: Positive for postnasal drip and rhinorrhea. Negative for congestion, ear pain and trouble swallowing.   Eyes: Negative for visual disturbance.  Respiratory: Negative for chest tightness, shortness of breath and wheezing.   Cardiovascular: Positive for leg swelling. Negative for chest pain and palpitations.  Gastrointestinal: Negative for nausea, vomiting and abdominal pain.       GERD symptoms nightly.    Endocrine: Negative.  Negative for polydipsia, polyphagia and polyuria.  Genitourinary: Negative for dysuria, urgency, discharge, penile pain and testicular pain.  Musculoskeletal: Negative for back pain, joint swelling and arthralgias.  Skin: Negative.   Allergic/Immunologic: Positive for environmental allergies.  Neurological: Negative for dizziness, weakness, numbness and headaches.  Psychiatric/Behavioral: Positive for sleep disturbance (excessive daytime sleepiness.). Negative for dysphoric mood.   Per HPI unless specifically indicated above     Objective:    BP 158/82 mmHg   Pulse 87  Temp(Src) 98.3 F (36.8 C) (Oral)  Resp 16  Ht '5\' 9"'  (1.753 m)  Wt 396 lb (179.624 kg)  BMI 58.45 kg/m2  Wt Readings from Last 3 Encounters:  05/07/15 396 lb (179.624 kg)  04/09/15 390 lb (176.903 kg)  01/17/15 392 lb (177.81 kg)    Physical Exam  Constitutional: He is oriented to person, place, and time. He appears well-developed and well-nourished. No distress.  HENT:  Head: Normocephalic and atraumatic.  Right Ear: Hearing and tympanic membrane normal.  Left Ear: Hearing and tympanic membrane normal.  Nose: Mucosal edema and rhinorrhea present. Right sinus exhibits no maxillary sinus tenderness and no frontal sinus tenderness. Left sinus exhibits no maxillary sinus tenderness and no frontal sinus tenderness.  Mouth/Throat: Uvula is midline, oropharynx is clear and moist and mucous membranes are normal.  Neck: Neck supple. No thyromegaly present.  Cardiovascular: Normal rate, regular rhythm and normal heart sounds.  Exam reveals no gallop and no friction rub.   No murmur heard. Pulmonary/Chest: Effort normal and breath sounds normal. He has no wheezes. He has no rhonchi. He has no rales.  Abdominal: Soft. Bowel sounds are normal. He exhibits no distension. There is no tenderness. There is no rebound.  Musculoskeletal: Normal range of motion. He exhibits no tenderness.       Right lower leg: He exhibits edema (+1 pitting edema to mid calf).       Left lower leg: He exhibits edema (+1 pitting edema to mid calf).  Neurological: He is alert and oriented to person, place, and time. He has normal reflexes.  Skin: Skin is warm and dry. No rash noted. No erythema.  Psychiatric: He has a normal mood and affect. His behavior is normal. Thought content normal.   Results for orders placed or performed in visit on 05/07/15  POCT HgB A1C  Result Value Ref Range   Hemoglobin A1C 7.6   POCT UA - Microalbumin  Result Value Ref Range   Microalbumin Ur, POC 100 mg/L   Creatinine, POC  0 mg/dL   Albumin/Creatinine Ratio, Urine, POC 0       Assessment & Plan:   Problem List Items Addressed This Visit      Cardiovascular and Mediastinum   Dilated cardiomyopathy ([place])    Last EF 20-25%. Sees Dr. Cloyd Stagers at [facility]. Takes lasix and spironolactone for diruesis. ACE and betablocker. Pt encouraged to keep appt with Cardiology.       Relevant Medications   lisinopril (PRINIVIL,ZESTRIL) 30 MG tablet   Benign hypertension    BP elevated today. Increase lisinopril 49m. Encouraged pt to check BP at home. Avoid high salt foods.  Recheck 1 mos.       Relevant Medications   lisinopril (PRINIVIL,ZESTRIL) 30 MG tablet     Respiratory   Allergic rhinitis    Start flonase to help alleviate symptoms.       Relevant Medications   fluticasone (FLONASE) 50 MCG/ACT nasal spray     Digestive   GERD (gastroesophageal reflux disease)    Continue PPI. Add Zantac QPM and up to BID. Consider GI referral for endo if not improving. Recheck 1 mos.       Relevant Medications   ranitidine (ZANTAC) 150 MG capsule     Endocrine   Uncontrolled type 2 diabetes mellitus with proteinuria or albuminuria - Primary    A1c down to 7.6%- significant improvement with Toujeo. Encouraged increasing exercise to help with weight loss and lowering A1c. Encouraged continued diet and lifestyle changes. Suggested increase to 18units daily of Toujeo and pt declined due to concern about hypoglycemia. ACE. +UA microalbumin. Needs eye exam- missed appt, pt will schedule.       Relevant Medications   lisinopril (PRINIVIL,ZESTRIL) 30 MG tablet     Genitourinary   Chronic kidney disease (CKD), stage III (moderate)   Relevant Orders   Basic Metabolic Panel (BMET)     Other   Morbid obesity ([place]    Loss of 35lbs! Encouraged continued weight loss to help control chronic conditions.       Vitamin D deficiency    Recheck vitamin D.       Relevant Orders   VITAMIN D 25 Hydroxy (Vit-D Deficiency,  Fractures)   HLD (hyperlipidemia)    Not on statin. Last LDL was 44. Recheck and consider statin with LDL > 70.  Relevant Medications   lisinopril (PRINIVIL,ZESTRIL) 30 MG tablet   Excessive daytime sleepiness    Possible Sleep apena. STOP Bang score 6/8.  Had previous appt for sleep study but missed, would like to know if it can be done at home. Will consult with [facility].        Other Visit Diagnoses    Abnormal weight gain        Relevant Orders    TSH       Meds ordered this encounter  Medications  . lisinopril (PRINIVIL,ZESTRIL) 30 MG tablet    Sig: Take 1 tablet (30 mg total) by mouth daily.    Dispense:  30 tablet    Refill:  11    Order Specific Question:  Supervising Provider    Answer:  Arlis Porta [phone]  . ranitidine (ZANTAC) 150 MG capsule    Sig: Take 1 capsule (150 mg total) by mouth every evening.    Dispense:  30 capsule    Refill:  11    Order Specific Question:  Supervising Provider    Answer:  Arlis Porta [phone]  . fluticasone (FLONASE) 50 MCG/ACT nasal spray    Sig: Place 2 sprays into both nostrils daily.    Dispense:  16 g    Refill:  11    Order Specific Question:  Supervising Provider    Answer:  Arlis Porta [phone]      Follow up plan: Return in about 4 weeks (around 06/04/2015) for Acid reflux. [name] Kitchen

## 2015-05-07 NOTE — Assessment & Plan Note (Signed)
Not on statin. Last LDL was 44. Recheck and consider statin with LDL > 70.

## 2015-05-07 NOTE — Patient Instructions (Addendum)
Diabetes: Keep your insuling dosing the same. Add in 10 minutes of walking after each meal. Watch your sugar and juice intake. Ideally some diet and increasing exercise will help decrease  GERD: Take zantac at bedtime to help with your symptoms.  Add flonase to help with your allergy symptoms.

## 2015-05-07 NOTE — Assessment & Plan Note (Addendum)
Possible Sleep apena. STOP Bang score 6/8.  Had previous appt for sleep study but missed, would like to know if it can be done at home. Will consult with [facility].

## 2015-05-07 NOTE — Assessment & Plan Note (Signed)
BP elevated today. Increase lisinopril 30mg . Encouraged pt to check BP at home. Avoid high salt foods.  Recheck 1 mos.

## 2015-05-07 NOTE — Assessment & Plan Note (Signed)
A1c down to 7.6%- significant improvement with Toujeo. Encouraged increasing exercise to help with weight loss and lowering A1c. Encouraged continued diet and lifestyle changes. Suggested increase to 18units daily of Toujeo and pt declined due to concern about hypoglycemia. ACE. +UA microalbumin. Needs eye exam- missed appt, pt will schedule.

## 2015-05-07 NOTE — Assessment & Plan Note (Signed)
Start flonase to help alleviate symptoms.

## 2015-05-07 NOTE — Assessment & Plan Note (Signed)
Loss of 35lbs! Encouraged continued weight loss to help control chronic conditions.

## 2015-05-13 ENCOUNTER — Ambulatory Visit: Payer: Managed Care, Other (non HMO) | Admitting: Family Medicine

## 2015-05-22 ENCOUNTER — Other Ambulatory Visit: Payer: Self-pay | Admitting: Family Medicine

## 2015-05-22 ENCOUNTER — Other Ambulatory Visit: Payer: Self-pay | Admitting: Unknown Physician Specialty

## 2015-05-23 ENCOUNTER — Other Ambulatory Visit: Payer: Self-pay | Admitting: Family Medicine

## 2015-05-23 MED ORDER — ALLOPURINOL 300 MG PO TABS: 300.0000 mg | ORAL_TABLET | Freq: Every day | ORAL | Status: DC

## 2015-05-23 MED ORDER — OMEPRAZOLE 40 MG PO CPDR: 40.0000 mg | DELAYED_RELEASE_CAPSULE | Freq: Every day | ORAL | Status: DC

## 2015-05-23 NOTE — Telephone Encounter (Signed)
Pt needs refills on allopurinol, omeprazole and vitamin D sent to [facility].  His call back number is [phone]

## 2015-05-23 NOTE — Telephone Encounter (Signed)
Not my patient any longer; Rxs denied

## 2015-06-04 ENCOUNTER — Ambulatory Visit (INDEPENDENT_AMBULATORY_CARE_PROVIDER_SITE_OTHER): Payer: Managed Care, Other (non HMO) | Admitting: Family Medicine

## 2015-06-04 ENCOUNTER — Encounter: Payer: Self-pay | Admitting: Family Medicine

## 2015-06-04 VITALS — BP 136/88 | HR 79 | Temp 98.4°F | Resp 16 | Ht 69.0 in | Wt 382.6 lb

## 2015-06-04 DIAGNOSIS — M1A39X0 Chronic gout due to renal impairment, multiple sites, without tophus (tophi): Secondary | ICD-10-CM

## 2015-06-04 DIAGNOSIS — K219 Gastro-esophageal reflux disease without esophagitis: Secondary | ICD-10-CM

## 2015-06-04 DIAGNOSIS — I420 Dilated cardiomyopathy: Secondary | ICD-10-CM | POA: Diagnosis not present

## 2015-06-04 DIAGNOSIS — E559 Vitamin D deficiency, unspecified: Secondary | ICD-10-CM

## 2015-06-04 DIAGNOSIS — E1165 Type 2 diabetes mellitus with hyperglycemia: Secondary | ICD-10-CM

## 2015-06-04 DIAGNOSIS — G4719 Other hypersomnia: Secondary | ICD-10-CM

## 2015-06-04 DIAGNOSIS — I10 Essential (primary) hypertension: Secondary | ICD-10-CM | POA: Diagnosis not present

## 2015-06-04 DIAGNOSIS — R740 Nonspecific elevation of levels of transaminase and lactic acid dehydrogenase [LDH]: Secondary | ICD-10-CM

## 2015-06-04 DIAGNOSIS — IMO0001 Reserved for inherently not codable concepts without codable children: Secondary | ICD-10-CM

## 2015-06-04 DIAGNOSIS — R809 Proteinuria, unspecified: Secondary | ICD-10-CM

## 2015-06-04 DIAGNOSIS — R7401 Elevation of levels of liver transaminase levels: Secondary | ICD-10-CM

## 2015-06-04 MED ORDER — INSULIN GLARGINE 300 UNIT/ML ~~LOC~~ SOPN: 16.0000 [IU] | PEN_INJECTOR | Freq: Every day | SUBCUTANEOUS | Status: DC

## 2015-06-04 MED ORDER — LISINOPRIL 30 MG PO TABS: 30.0000 mg | ORAL_TABLET | Freq: Every day | ORAL | Status: DC

## 2015-06-04 MED ORDER — TRULICITY 0.75 MG/0.5ML ~~LOC~~ SOAJ: 0.7500 mg | SUBCUTANEOUS | Status: DC

## 2015-06-04 MED ORDER — CARVEDILOL 25 MG PO TABS: 25.0000 mg | ORAL_TABLET | Freq: Two times a day (BID) | ORAL | Status: DC

## 2015-06-04 MED ORDER — ALLOPURINOL 300 MG PO TABS: 300.0000 mg | ORAL_TABLET | Freq: Every day | ORAL | Status: DC

## 2015-06-04 MED ORDER — OMEPRAZOLE 40 MG PO CPDR
40.0000 mg | DELAYED_RELEASE_CAPSULE | Freq: Every day | ORAL | Status: DC
Start: 2015-06-04 — End: 2015-06-04

## 2015-06-04 MED ORDER — OMEPRAZOLE 40 MG PO CPDR
40.0000 mg | DELAYED_RELEASE_CAPSULE | Freq: Every day | ORAL | Status: DC
Start: 2015-06-04 — End: 2015-06-05

## 2015-06-04 MED ORDER — FUROSEMIDE 40 MG PO TABS: 40.0000 mg | ORAL_TABLET | Freq: Four times a day (QID) | ORAL | Status: DC

## 2015-06-04 NOTE — Assessment & Plan Note (Signed)
Check uric acid. Renewed allopurinol for 90 day supply. Consider change to uloric with decreasing kidney function.

## 2015-06-04 NOTE — Assessment & Plan Note (Signed)
Appears current medication have controlled symptoms. Discussed getting an EGD with patient due to "sensation of food stuck-" Pt declined- stated it was just that he needed to burp. Alarm symptoms reviewed. Recheck 2 mos.

## 2015-06-04 NOTE — Progress Notes (Signed)
Subjective:    Patient ID: [name], male    DOB: 1965-12-11, 50 y.o.   MRN: [id number]  HPI: [name] is a 50 y.o. male presenting on 06/04/2015 for Gastroesophageal Reflux   HPI  Pt presents follow-up of acid reflux. Omeprazole is doing well. Reports some belching. Improved with omeprazole. No blood in stool or vomit. Quit eating fried food and symptoms have improved.  Hypertension: Improved. Does not check BP at home.  BP controlled today. Denies CP, SOB, or visual changes. Recently seen cardiology who drew labs to check renal function; Cr was elevated but around baseline.   Increased dose of lisinopril to 35m at last visit.  Liver enzymes elevated on last labs: He reports this has never been discussed with him. Denies excessive use to tylenol, alcohol, or ibuprofen.  Pt is reporting nasal congestion due to allergies. He has not started taking flonase for allergies. States they do not help his symptoms.  Needs sleep study to r/o sleep apnea. Snores. Witnessed apneas. Neck circumference >17 inches. Obese with hypertension.  Pt needs 90 day supply of his medication due to recent loss of job.    Past Medical History  Diagnosis Date  . Diabetes mellitus without complication ([place]   . Hypertension   . Asthma   . Dilated cardiomyopathy ([place]   . Reflux   . Morbid obesity ([place]   . Allergy   . Gout     Current Outpatient Prescriptions on File Prior to Visit  Medication Sig  . albuterol (VENTOLIN HFA) 108 (90 Base) MCG/ACT inhaler Inhale 2 puffs into the lungs every 4 (four) hours as needed for wheezing or shortness of breath.  .[name] Kitchenaspirin 81 MG chewable tablet Chew 81 mg by mouth daily.  . blood glucose meter kit and supplies KIT Brand of choice; LON 99 months; check FSBS 3x a day; E11.65; disp one meter with 100 strips + 1 refill of strips  . fluticasone (FLONASE) 50 MCG/ACT nasal spray Place 2 sprays into both nostrils daily.  . Insulin Pen Needle 31G X 8 MM MISC by  Does not apply route.  . ranitidine (ZANTAC) 150 MG capsule Take 1 capsule (150 mg total) by mouth every evening.  .[name] Kitchenspironolactone (ALDACTONE) 25 MG tablet Take 25 mg by mouth daily.  . vitamin C (ASCORBIC ACID) 500 MG tablet Take 1,000 mg by mouth daily.  . Vitamin D, Ergocalciferol, (DRISDOL) 50000 units CAPS capsule Take 1 capsule (50,000 Units total) by mouth every 30 (thirty) days.  . Zinc 50 MG CAPS Take 50 mg by mouth daily.   No current facility-administered medications on file prior to visit.    Review of Systems  Constitutional: Negative for fever and chills.  HENT: Positive for congestion.   Respiratory: Positive for wheezing. Negative for chest tightness and shortness of breath.   Cardiovascular: Negative for chest pain, palpitations and leg swelling.  Gastrointestinal: Negative for nausea, vomiting and abdominal pain.  Endocrine: Negative.   Genitourinary: Negative for dysuria, urgency, discharge, penile pain and testicular pain.  Musculoskeletal: Negative for back pain, joint swelling and arthralgias.  Skin: Negative.   Neurological: Negative for dizziness, weakness, numbness and headaches.  Psychiatric/Behavioral: Negative for sleep disturbance and dysphoric mood.   Per HPI unless specifically indicated above     Objective:    BP 136/88 mmHg  Pulse 79  Temp(Src) 98.4 F (36.9 C) (Oral)  Resp 16  Ht 5' 9" (1.753 m)  Wt 382 lb 9.6 oz (  173.546 kg)  BMI 56.47 kg/m2  Wt Readings from Last 3 Encounters:  06/04/15 382 lb 9.6 oz (173.546 kg)  05/07/15 396 lb (179.624 kg)  04/09/15 390 lb (176.903 kg)    Physical Exam  Constitutional: He is oriented to person, place, and time. He appears well-developed and well-nourished. No distress.  HENT:  Head: Normocephalic and atraumatic.  Neck: Neck supple. No thyromegaly present.  Cardiovascular: Normal rate, regular rhythm and normal heart sounds.  Exam reveals no gallop and no friction rub.   No murmur  heard. Pulmonary/Chest: Effort normal and breath sounds normal. He has no wheezes.  Abdominal: Soft. Bowel sounds are normal. He exhibits no distension. There is no tenderness. There is no rebound.  Musculoskeletal: Normal range of motion. He exhibits no edema or tenderness.  Neurological: He is alert and oriented to person, place, and time. He has normal reflexes.  Skin: Skin is warm and dry. No rash noted. No erythema.  Psychiatric: He has a normal mood and affect. His behavior is normal. Thought content normal.   Results for orders placed or performed in visit on 05/07/15  POCT HgB A1C  Result Value Ref Range   Hemoglobin A1C 7.6   POCT UA - Microalbumin  Result Value Ref Range   Microalbumin Ur, POC 100 mg/L   Creatinine, POC 0 mg/dL   Albumin/Creatinine Ratio, Urine, POC 0       Assessment & Plan:   Problem List Items Addressed This Visit      Cardiovascular and Mediastinum   Dilated cardiomyopathy (HCC)   Relevant Medications   furosemide (LASIX) 40 MG tablet   carvedilol (COREG) 25 MG tablet   lisinopril (PRINIVIL,ZESTRIL) 30 MG tablet   Benign hypertension   Relevant Medications   furosemide (LASIX) 40 MG tablet   carvedilol (COREG) 25 MG tablet   lisinopril (PRINIVIL,ZESTRIL) 30 MG tablet     Digestive   GERD (gastroesophageal reflux disease) - Primary    Appears current medication have controlled symptoms. Discussed getting an EGD with patient due to "sensation of food stuck-" Pt declined- stated it was just that he needed to burp. Alarm symptoms reviewed. Recheck 2 mos.       Relevant Medications   omeprazole (PRILOSEC) 40 MG capsule     Endocrine   Uncontrolled type 2 diabetes mellitus with proteinuria or albuminuria    Changed medications to 90 day supply per patient preference.       Relevant Medications   lisinopril (PRINIVIL,ZESTRIL) 30 MG tablet   TRULICITY 6.30 ZS/0.1UX SOPN   Insulin Glargine (TOUJEO SOLOSTAR) 300 UNIT/ML SOPN     Other   Gout     Check uric acid. Renewed allopurinol for 90 day supply. Consider change to uloric with decreasing kidney function.       Relevant Medications   allopurinol (ZYLOPRIM) 300 MG tablet   Other Relevant Orders   Uric acid   Vitamin D deficiency    Recheck vitamin D to determine if therapy needs to continue.       Relevant Orders   VITAMIN D 25 Hydroxy (Vit-D Deficiency, Fractures)   Excessive daytime sleepiness    Home sleep study test ordered. STOP BANG score 6/8. Risk factors obesity, hypertension, heart disease. Plan for sleep study with CPAP titration.       Elevated transaminase level    Appears to be a trend on labs per care everywhere. Recheck today. Consider check for hepatitis and abdominal ultrasound to determine cause liver enzyme elevation.  Relevant Orders   Comprehensive metabolic panel      Meds ordered this encounter  Medications  . DISCONTD: Vitamin D, Ergocalciferol, (DRISDOL) 50000 units CAPS capsule    Sig: Take by mouth.  . furosemide (LASIX) 40 MG tablet    Sig: Take 1 tablet (40 mg total) by mouth 4 (four) times daily. Reported on 01/17/2015    Dispense:  90 tablet    Refill:  3  . carvedilol (COREG) 25 MG tablet    Sig: Take 1 tablet (25 mg total) by mouth 2 (two) times daily with a meal.    Dispense:  90 tablet    Refill:  3  . lisinopril (PRINIVIL,ZESTRIL) 30 MG tablet    Sig: Take 1 tablet (30 mg total) by mouth daily.    Dispense:  90 tablet    Refill:  3  . DISCONTD: TRULICITY 9.37 TK/2.4OX SOPN    Sig: Inject 0.75 mg into the skin once a week.    Dispense:  6 mL    Refill:  3  . DISCONTD: Insulin Glargine (TOUJEO SOLOSTAR) 300 UNIT/ML SOPN    Sig: Inject 16 Units into the skin daily.    Dispense:  15 pen    Refill:  3    No Rx given today  . DISCONTD: allopurinol (ZYLOPRIM) 300 MG tablet    Sig: Take 1 tablet (300 mg total) by mouth daily.    Dispense:  90 tablet    Refill:  3  . DISCONTD: omeprazole (PRILOSEC) 40 MG capsule     Sig: Take 1 capsule (40 mg total) by mouth daily.    Dispense:  90 capsule    Refill:  3  . omeprazole (PRILOSEC) 40 MG capsule    Sig: Take 1 capsule (40 mg total) by mouth daily.    Dispense:  30 capsule    Refill:  5  . allopurinol (ZYLOPRIM) 300 MG tablet    Sig: Take 1 tablet (300 mg total) by mouth daily.    Dispense:  30 tablet    Refill:  5  . TRULICITY 7.35 HG/9.9ME SOPN    Sig: Inject 0.75 mg into the skin once a week.    Dispense:  6 mL    Refill:  3  . Insulin Glargine (TOUJEO SOLOSTAR) 300 UNIT/ML SOPN    Sig: Inject 16 Units into the skin daily.    Dispense:  15 pen    Refill:  3    No Rx given today      Follow up plan: Return in about 2 months (around 08/07/2015), or if symptoms worsen or fail to improve, for Diabetes- anytime after 7/12.

## 2015-06-04 NOTE — Assessment & Plan Note (Signed)
Home sleep study test ordered. STOP BANG score 6/8. Risk factors obesity, hypertension, heart disease. Plan for sleep study with CPAP titration.

## 2015-06-04 NOTE — Assessment & Plan Note (Signed)
Changed medications to 90 day supply per patient preference.

## 2015-06-04 NOTE — Assessment & Plan Note (Signed)
Recheck vitamin D to determine if therapy needs to continue.

## 2015-06-04 NOTE — Patient Instructions (Signed)
Please get lab work done at your earliest convenience.   Keep taking the acid reflux medications as prescribed.   Continue with diet changes for your diabetes. We will recheck in 2 mos.

## 2015-06-04 NOTE — Assessment & Plan Note (Signed)
Appears to be a trend on labs per care everywhere. Recheck today. Consider check for hepatitis and abdominal ultrasound to determine cause liver enzyme elevation.

## 2015-06-05 ENCOUNTER — Telehealth: Payer: Self-pay | Admitting: Family Medicine

## 2015-06-05 ENCOUNTER — Other Ambulatory Visit: Payer: Self-pay | Admitting: Family Medicine

## 2015-06-05 DIAGNOSIS — K219 Gastro-esophageal reflux disease without esophagitis: Secondary | ICD-10-CM

## 2015-06-05 DIAGNOSIS — M1A39X0 Chronic gout due to renal impairment, multiple sites, without tophus (tophi): Secondary | ICD-10-CM

## 2015-06-05 MED ORDER — ALLOPURINOL 300 MG PO TABS: 300.0000 mg | ORAL_TABLET | Freq: Every day | ORAL | Status: DC

## 2015-06-05 MED ORDER — OMEPRAZOLE 40 MG PO CPDR
40.0000 mg | DELAYED_RELEASE_CAPSULE | Freq: Every day | ORAL | Status: DC
Start: 2015-06-05 — End: 2017-01-21

## 2015-06-05 MED ORDER — RANITIDINE HCL 150 MG PO CAPS
150.0000 mg | ORAL_CAPSULE | Freq: Every evening | ORAL | Status: DC
Start: 2015-06-05 — End: 2016-05-13

## 2015-06-05 NOTE — Telephone Encounter (Signed)
-----   Message from Berton Mount sent at 06/04/2015  9:25 AM EDT ----- Regarding: RE: Home Sleep Study [name],  I have pulled the office notes and demos for this patient and forwarded to [place] at [place].  He will contact you if he needs anything further:)  Leafy Ro at [place] ----- Message -----    From: Luciana Axe, NP    Sent: 06/04/2015   9:02 AM      To: [place], # Subject: Home Sleep Study                               Can we set this patient up with a home sleep study to determine if he has sleep apnea? He has 2 children he cannot leave.  Thanks! [name]

## 2015-07-15 ENCOUNTER — Telehealth: Payer: Self-pay | Admitting: Family Medicine

## 2015-07-15 ENCOUNTER — Other Ambulatory Visit: Payer: Self-pay | Admitting: Family Medicine

## 2015-07-15 DIAGNOSIS — I10 Essential (primary) hypertension: Secondary | ICD-10-CM

## 2015-07-15 NOTE — Telephone Encounter (Signed)
Pt needs a letter that states he able to stand-up to work. Letter written and on file.

## 2015-07-15 NOTE — Telephone Encounter (Signed)
Pt. Called requesting that you call him back about a letter you   Sign for him Pt call back  # [phone]

## 2015-07-15 NOTE — Telephone Encounter (Signed)
error 

## 2015-07-23 ENCOUNTER — Telehealth: Payer: Self-pay | Admitting: Family Medicine

## 2015-07-23 NOTE — Telephone Encounter (Signed)
error 

## 2015-08-06 ENCOUNTER — Telehealth: Payer: Self-pay | Admitting: Family Medicine

## 2015-08-06 ENCOUNTER — Ambulatory Visit (INDEPENDENT_AMBULATORY_CARE_PROVIDER_SITE_OTHER): Payer: Medicaid Other | Admitting: Family Medicine

## 2015-08-06 VITALS — BP 142/84 | HR 75 | Temp 98.3°F | Resp 16 | Ht 69.0 in | Wt >= 6400 oz

## 2015-08-06 DIAGNOSIS — IMO0001 Reserved for inherently not codable concepts without codable children: Secondary | ICD-10-CM

## 2015-08-06 DIAGNOSIS — R809 Proteinuria, unspecified: Secondary | ICD-10-CM

## 2015-08-06 DIAGNOSIS — G4719 Other hypersomnia: Secondary | ICD-10-CM | POA: Diagnosis not present

## 2015-08-06 DIAGNOSIS — Z9119 Patient's noncompliance with other medical treatment and regimen: Secondary | ICD-10-CM | POA: Diagnosis not present

## 2015-08-06 DIAGNOSIS — I420 Dilated cardiomyopathy: Secondary | ICD-10-CM

## 2015-08-06 DIAGNOSIS — I10 Essential (primary) hypertension: Secondary | ICD-10-CM

## 2015-08-06 DIAGNOSIS — N183 Chronic kidney disease, stage 3 unspecified: Secondary | ICD-10-CM

## 2015-08-06 DIAGNOSIS — I5023 Acute on chronic systolic (congestive) heart failure: Secondary | ICD-10-CM | POA: Diagnosis not present

## 2015-08-06 DIAGNOSIS — E1165 Type 2 diabetes mellitus with hyperglycemia: Secondary | ICD-10-CM | POA: Diagnosis not present

## 2015-08-06 DIAGNOSIS — K219 Gastro-esophageal reflux disease without esophagitis: Secondary | ICD-10-CM | POA: Diagnosis not present

## 2015-08-06 DIAGNOSIS — Z91199 Patient's noncompliance with other medical treatment and regimen due to unspecified reason: Secondary | ICD-10-CM

## 2015-08-06 LAB — POCT GLYCOSYLATED HEMOGLOBIN (HGB A1C): Hemoglobin A1C: 8.3

## 2015-08-06 MED ORDER — INSULIN GLARGINE 100 UNIT/ML SOLOSTAR PEN: 18.0000 [IU] | PEN_INJECTOR | Freq: Every day | SUBCUTANEOUS | Status: DC

## 2015-08-06 MED ORDER — CARVEDILOL 25 MG PO TABS
25.0000 mg | ORAL_TABLET | Freq: Two times a day (BID) | ORAL | Status: DC
Start: 2015-08-06 — End: 2015-09-03

## 2015-08-06 MED ORDER — FUROSEMIDE 40 MG PO TABS: 80.0000 mg | ORAL_TABLET | Freq: Two times a day (BID) | ORAL | Status: DC

## 2015-08-06 NOTE — Telephone Encounter (Signed)
Okay. Please inform that patient that I will change him to 18 units of lantus daily.

## 2015-08-06 NOTE — Assessment & Plan Note (Addendum)
44lb weight gain since 5/9 visit. Have encouraged pt to call cardiology to discuss his weight gain and weigh himself daily to have a better idea of weight gain.  Called cardiology to schedule an earlier appt to address his weight gain and check fluid status. Pt has been self titrating his lasix per his reports he is taking 80mg  twice daily- LMTCB with triage nurse.  Check kidney function- will consider adding extra lasix dosing. Reviewed alarm symptoms- shortness of breath, chest pain, etc.

## 2015-08-06 NOTE — Telephone Encounter (Signed)
LMTCB

## 2015-08-06 NOTE — Assessment & Plan Note (Signed)
Had a long conversation with patient about being responsible for his health. Encouraged him to alert provider to weight gain, weigh self, and get scheduled lab work to allow [place] to care for him well.

## 2015-08-06 NOTE — Patient Instructions (Addendum)
I suggest you talk to someone about the way you are feeling in regards to your health. I know chronic health problems can be a struggle.   [place], [place]  Call Mrs. Karen [place] [phone]   You need to call Dr. Charlsie Merles office to discuss your weight gain. Weigh yourself once daily every morning upon awakening after voiding. If you gain 2lbs overnight or 5lbs in 1 week, you need to let Dr. Cloyd Stagers know.   We will get you to a gastroenterologist to discuss your GERD symptoms.   Toujeo- go up 2 units per week for fasting blood sugars > 120.  For example 18 units this week, 20 the week after that.   We will get a home sleep study set up for you.

## 2015-08-06 NOTE — Telephone Encounter (Signed)
Pt. Called states that [employer]  will not pay for Toujeo have requested that eyou call something  else  In . Pt also have requested that you call him  also

## 2015-08-06 NOTE — Progress Notes (Signed)
Subjective:    Patient ID: [name], male    DOB: Jun 20, 1965, 50 y.o.   MRN: [id number]  HPI: [name] is a 50 y.o. male presenting on 08/06/2015 for Diabetes   HPI  Pt presents for diabetes follow-up. A1c today is 8.4%. Pt reports his fasting sugars are 108 or less. After meals he is 124 or 120- not sure how long after meals he is testing. He is taking 16 units of toujeo daily. Was previously on metformin but stopped due to kidney function.  Pt also reports he is gaining weight. He has gone from 382lbs on 5/9 to 429 lbs today.  He does not weight himself daily. He has not called his cardiologist to discuss weight gain. He reports weight gain is in his belly.  BP is elevated: Taking carvedilol twice daily. 50 mg twice daily. 30 mg of lisinopril daily. And 25 mg aldactone daily Pt previously declined home sleep study but cardiology would very much like for him to get this done. Pt is will get to study at this time. Does snore. Reported apneas. Risks include CHF, obesity, neck circumference > 17in. STOP bang score is 6/8.  Pt also reports feeling frustrated with his health and tired of feeling bad.  Poor mood. He denies SI.  Pt is concerned about GERD symptoms- still has congestion is his throat and belches a lot. Feels this is caused by his acid reflux. Is taking omeprazole and ranitidine twice daily. No help with symptoms. No recent EGD.  Past Medical History  Diagnosis Date  . Diabetes mellitus without complication ([place])   . Hypertension   . Asthma   . Dilated cardiomyopathy ([place])   . Reflux   . Morbid obesity ([place])   . Allergy   . Gout     Current Outpatient Prescriptions on File Prior to Visit  Medication Sig  . albuterol (VENTOLIN HFA) 108 (90 Base) MCG/ACT inhaler Inhale 2 puffs into the lungs every 4 (four) hours as needed for wheezing or shortness of breath.  . allopurinol (ZYLOPRIM) 300 MG tablet Take 1 tablet (300 mg total) by mouth daily.  [name] Kitchen aspirin 81 MG  chewable tablet Chew 81 mg by mouth daily.  . blood glucose meter kit and supplies [employer] Brand of choice; LON 99 months; check FSBS 3x a day; E11.65; disp one meter with 100 strips + 1 refill of strips  . fluticasone (FLONASE) 50 MCG/ACT nasal spray Place 2 sprays into both nostrils daily.  . Insulin Pen Needle 31G X 8 MM MISC by Does not apply route.  [name] Kitchen lisinopril (PRINIVIL,ZESTRIL) 30 MG tablet Take 1 tablet (30 mg total) by mouth daily.  [name] Kitchen omeprazole (PRILOSEC) 40 MG capsule Take 1 capsule (40 mg total) by mouth daily.  . ranitidine (ZANTAC) 150 MG capsule Take 1 capsule (150 mg total) by mouth every evening.  [name] Kitchen spironolactone (ALDACTONE) 25 MG tablet Take 25 mg by mouth daily.  . TRULICITY 2.12 YQ/8.2NO SOPN Inject 0.75 mg into the skin once a week.  . vitamin C (ASCORBIC ACID) 500 MG tablet Take 1,000 mg by mouth daily.  . Vitamin D, Ergocalciferol, (DRISDOL) 50000 units CAPS capsule Take 1 capsule (50,000 Units total) by mouth every 30 (thirty) days.  . Zinc 50 MG CAPS Take 50 mg by mouth daily.   No current facility-administered medications on file prior to visit.    Review of Systems  Constitutional: Positive for fatigue and unexpected weight change. Negative for fever and chills.  HENT: Positive for sore throat (atrributes to acid reflux).   Respiratory: Negative for chest tightness, shortness of breath and wheezing.   Cardiovascular: Positive for leg swelling. Negative for chest pain and palpitations.  Gastrointestinal: Positive for abdominal pain (increased acid reflux symptoms.). Negative for nausea and vomiting.  Endocrine: Negative.   Genitourinary: Negative for dysuria, urgency, discharge, penile pain and testicular pain.  Musculoskeletal: Negative for back pain, joint swelling and arthralgias.  Skin: Negative.   Neurological: Negative for dizziness, weakness, numbness and headaches.  Psychiatric/Behavioral: Positive for dysphoric mood. Negative for suicidal ideas and sleep  disturbance.   Per HPI unless specifically indicated above     Objective:    BP 142/84 mmHg  Pulse 75  Temp(Src) 98.3 F (36.8 C) (Oral)  Resp 16  Ht _0  (1.753 m)  Wt 429 lb 9.6 oz (194.865 kg)  BMI 63.41 kg/m2  Wt Readings from Last 3 Encounters:  08/06/15 429 lb 9.6 oz (194.865 kg)  06/04/15 382 lb 9.6 oz (173.546 kg)  05/07/15 396 lb (179.624 kg)    Physical Exam  Abdominal: Soft. Normal appearance and bowel sounds are normal. There is no tenderness. There is no CVA tenderness.  Musculoskeletal:       Right lower leg: He exhibits edema (+1-2).       Left lower leg: He exhibits edema (+1-2).  Psychiatric: He has a normal mood and affect. His speech is normal and behavior is normal. Judgment and thought content normal. Cognition and memory are normal. He expresses no suicidal ideation. He expresses no suicidal plans.   Results for orders placed or performed in visit on 08/06/15  POCT HgB A1C  Result Value Ref Range   Hemoglobin A1C 8.3       Assessment & Plan:   Problem List Items Addressed This Visit      Cardiovascular and Mediastinum   Dilated cardiomyopathy ([place])    44lb weight gain since 5/9 visit. Have encouraged pt to call cardiology to discuss his weight gain and weigh himself daily to have a better idea of weight gain.  Called cardiology to schedule an earlier appt to address his weight gain and check fluid status. Pt has been self titrating his lasix per his reports he is taking 91m twice daily- LMTCB with triage nurse.  Check kidney function- will consider adding extra lasix dosing. Reviewed alarm symptoms- shortness of breath, chest pain, etc.        Relevant Medications   carvedilol (COREG) 25 MG tablet   furosemide (LASIX) 40 MG tablet   Other Relevant Orders   Lipid Profile   Benign hypertension   Relevant Medications   carvedilol (COREG) 25 MG tablet   furosemide (LASIX) 40 MG tablet   Acute on chronic systolic heart failure (HCC)   Relevant  Medications   carvedilol (COREG) 25 MG tablet   furosemide (LASIX) 40 MG tablet     Digestive   GERD (gastroesophageal reflux disease)   Relevant Orders   Ambulatory referral to General Surgery     Endocrine   Uncontrolled type 2 diabetes mellitus with proteinuria or albuminuria - Primary    Change to lantus from Toujeo due to insurance reasons. Increase to 18 units. Have asked pt to come back in 1 mos and bring meter since reports sugars are not reflective of his hemoglobin A1c.  Will consider oral glipizide to help control.  Pt reminded to schedule eye exam. Check CMP and lipids.       Relevant  Medications   Insulin Glargine (LANTUS) 100 UNIT/ML Solostar Pen   Other Relevant Orders   POCT HgB A1C (Completed)   COMPLETE METABOLIC PANEL WITH GFR     Genitourinary   Chronic kidney disease (CKD), stage III (moderate)   Relevant Orders   Uric acid     Other   Noncompliance    Had a long conversation with patient about being responsible for his health. Encouraged him to alert provider to weight gain, weigh self, and get scheduled lab work to allow [place] to care for him well.      Excessive daytime sleepiness    Pt is amenable to home sleep study. He will not do sleep study at the lab. Have reviewed the potential risks of untreated sleep apnea including worsening heart failure and death. Encouraged pt to have sleep study performed. Due to medicaid, pt must have a lab study.       Relevant Orders   VITAMIN D 25 Hydroxy (Vit-D Deficiency, Fractures)      Meds ordered this encounter  Medications  . carvedilol (COREG) 25 MG tablet    Sig: Take 1 tablet (25 mg total) by mouth 2 (two) times daily with a meal.    Dispense:  360 tablet    Refill:  3    Order Specific Question:  Supervising Provider    Answer:  Arlis Porta [phone]  . Insulin Glargine (LANTUS) 100 UNIT/ML Solostar Pen    Sig: Inject 18 Units into the skin daily at 10 pm.    Dispense:  15 mL    Refill:  11      Order Specific Question:  Supervising Provider    Answer:  Arlis Porta [phone]  . furosemide (LASIX) 40 MG tablet    Sig: Take 2 tablets (80 mg total) by mouth 2 (two) times daily. Reported on 01/17/2015    Dispense:  90 tablet    Refill:  3    Order Specific Question:  Supervising Provider    Answer:  Arlis Porta [[id number]]      Follow up plan: Return in about 4 weeks (around 09/03/2015) for diabetes. [name] Kitchen

## 2015-08-06 NOTE — Assessment & Plan Note (Addendum)
Change to lantus from Toujeo due to insurance reasons. Increase to 18 units. Have asked pt to come back in 1 mos and bring meter since reports sugars are not reflective of his hemoglobin A1c.  Will consider oral glipizide to help control.  Pt reminded to schedule eye exam. Check CMP and lipids.

## 2015-08-06 NOTE — Assessment & Plan Note (Signed)
Pt is amenable to home sleep study. He will not do sleep study at the lab. Have reviewed the potential risks of untreated sleep apnea including worsening heart failure and death. Encouraged pt to have sleep study performed. Due to medicaid, pt must have a lab study.

## 2015-08-07 ENCOUNTER — Other Ambulatory Visit: Payer: Self-pay | Admitting: Family Medicine

## 2015-08-07 DIAGNOSIS — N183 Chronic kidney disease, stage 3 unspecified: Secondary | ICD-10-CM

## 2015-08-07 LAB — LIPID PANEL
CHOL/HDL RATIO: 2 ratio (ref <=5.0)
Cholesterol: 78 mg/dL — ABNORMAL LOW (ref 125–200)
HDL: 40 mg/dL (ref >=40)
LDL Cholesterol: 27 mg/dL (ref <130)
Triglycerides: 57 mg/dL (ref <150)
VLDL: 11 mg/dL (ref <30)

## 2015-08-07 LAB — VITAMIN D 25 HYDROXY (VIT D DEFICIENCY, FRACTURES): VIT D 25 HYDROXY: 30 ng/mL (ref 30–100)

## 2015-08-07 LAB — COMPLETE METABOLIC PANEL WITH GFR
ALBUMIN: 2.9 g/dL — AB (ref 3.6–5.1)
ALK PHOS: 160 U/L — AB (ref 40–115)
ALT: 27 U/L (ref 9–46)
AST: 20 U/L (ref 10–40)
BUN: 32 mg/dL — AB (ref 7–25)
CO2: 40 mmol/L — ABNORMAL HIGH (ref 20–31)
Calcium: 8.3 mg/dL — ABNORMAL LOW (ref 8.6–10.3)
Chloride: 95 mmol/L — ABNORMAL LOW (ref 98–110)
Creat: 1.97 mg/dL — ABNORMAL HIGH (ref 0.60–1.35)
GFR, Est African American: 45 mL/min — ABNORMAL LOW (ref >=60)
GFR, Est Non African American: 39 mL/min — ABNORMAL LOW (ref >=60)
GLUCOSE: 135 mg/dL — AB (ref 65–99)
POTASSIUM: 4 mmol/L (ref 3.5–5.3)
SODIUM: 144 mmol/L (ref 135–146)
TOTAL PROTEIN: 5.7 g/dL — AB (ref 6.1–8.1)
Total Bilirubin: 1 mg/dL (ref 0.2–1.2)

## 2015-08-07 LAB — URIC ACID: URIC ACID, SERUM: 5 mg/dL (ref 4.0–8.0)

## 2015-08-07 MED ORDER — FUROSEMIDE 20 MG PO TABS: 20.0000 mg | ORAL_TABLET | Freq: Every day | ORAL | Status: DC

## 2015-08-07 NOTE — Telephone Encounter (Signed)
Pt advised by Amy.

## 2015-08-08 NOTE — Telephone Encounter (Signed)
Advised pt to drink 2L fluid per day. Drink water. He will follow-up with Priscella Mann NP with heart failure.

## 2015-08-14 ENCOUNTER — Telehealth: Payer: Self-pay | Admitting: Family Medicine

## 2015-08-14 NOTE — Telephone Encounter (Signed)
Pt requested a called so he could update you on recent cardio appt [phone]

## 2015-08-16 ENCOUNTER — Other Ambulatory Visit: Payer: Self-pay

## 2015-08-16 NOTE — Telephone Encounter (Signed)
Rx denied; not my patient

## 2015-08-20 NOTE — Telephone Encounter (Signed)
Patient called to gave an update for his cardiologist appointment and as per patient  they Rx Furosamide 3 tab in morning and 3 in evening but patient decided to take 5 in morning and 5 in evening onset weeks ago so that he can loose fluid and body weight I advised pt that he should contact cardiologist and make them aware about changes he had done and ask them if they want to Rx this and also his sugar level fasting stays in range of 69,89,109 and pt requested if Amy can call her back.

## 2015-08-20 NOTE — Telephone Encounter (Signed)
Going to see Priscella Mann on Thursday.  Advised pt to take only as prescribed. If he needs a refill, please call cardiology as they are managing this medication.

## 2015-08-20 NOTE — Telephone Encounter (Signed)
Called and LMTCB. 

## 2015-08-27 ENCOUNTER — Telehealth: Payer: Self-pay | Admitting: Family Medicine

## 2015-08-27 DIAGNOSIS — E1165 Type 2 diabetes mellitus with hyperglycemia: Secondary | ICD-10-CM

## 2015-08-27 DIAGNOSIS — L609 Nail disorder, unspecified: Secondary | ICD-10-CM

## 2015-08-27 NOTE — Telephone Encounter (Signed)
Called patient to get more information as to why he needed referral.  Patient states the other night his left great toenail fell off. Referral has been entered.[place]

## 2015-08-27 NOTE — Telephone Encounter (Signed)
Pt requested a call about getting a referral to podiatrist at [facility] [phone]

## 2015-09-03 ENCOUNTER — Ambulatory Visit
Admission: RE | Admit: 2015-09-03 | Discharge: 2015-09-03 | Disposition: A | Discharge status: Home / Self Care | Payer: Medicaid Other | Source: Ambulatory Visit | Attending: Family Medicine | Admitting: Family Medicine

## 2015-09-03 ENCOUNTER — Encounter: Payer: Self-pay | Admitting: Family Medicine

## 2015-09-03 ENCOUNTER — Ambulatory Visit (INDEPENDENT_AMBULATORY_CARE_PROVIDER_SITE_OTHER): Payer: Medicaid Other | Admitting: Family Medicine

## 2015-09-03 VITALS — BP 129/85 | HR 79 | Temp 98.5°F | Resp 16 | Ht 69.0 in | Wt 385.6 lb

## 2015-09-03 DIAGNOSIS — IMO0001 Reserved for inherently not codable concepts without codable children: Secondary | ICD-10-CM

## 2015-09-03 DIAGNOSIS — Z720 Tobacco use: Secondary | ICD-10-CM

## 2015-09-03 DIAGNOSIS — R0602 Shortness of breath: Secondary | ICD-10-CM | POA: Diagnosis present

## 2015-09-03 DIAGNOSIS — I10 Essential (primary) hypertension: Secondary | ICD-10-CM

## 2015-09-03 DIAGNOSIS — E1165 Type 2 diabetes mellitus with hyperglycemia: Secondary | ICD-10-CM | POA: Diagnosis not present

## 2015-09-03 DIAGNOSIS — N183 Chronic kidney disease, stage 3 unspecified: Secondary | ICD-10-CM

## 2015-09-03 DIAGNOSIS — J4530 Mild persistent asthma, uncomplicated: Secondary | ICD-10-CM

## 2015-09-03 DIAGNOSIS — J45909 Unspecified asthma, uncomplicated: Secondary | ICD-10-CM | POA: Insufficient documentation

## 2015-09-03 DIAGNOSIS — R809 Proteinuria, unspecified: Secondary | ICD-10-CM | POA: Diagnosis not present

## 2015-09-03 DIAGNOSIS — I5023 Acute on chronic systolic (congestive) heart failure: Secondary | ICD-10-CM

## 2015-09-03 DIAGNOSIS — F1910 Other psychoactive substance abuse, uncomplicated: Secondary | ICD-10-CM | POA: Diagnosis not present

## 2015-09-03 DIAGNOSIS — I517 Cardiomegaly: Secondary | ICD-10-CM | POA: Insufficient documentation

## 2015-09-03 LAB — BASIC METABOLIC PANEL WITH GFR
BUN: 39 mg/dL — ABNORMAL HIGH (ref 7–25)
CALCIUM: 8.9 mg/dL (ref 8.6–10.3)
CO2: 38 mmol/L — ABNORMAL HIGH (ref 20–31)
Chloride: 93 mmol/L — ABNORMAL LOW (ref 98–110)
Creat: 2.64 mg/dL — ABNORMAL HIGH (ref 0.60–1.35)
GFR, EST AFRICAN AMERICAN: 32 mL/min — AB (ref >=60)
GFR, EST NON AFRICAN AMERICAN: 27 mL/min — AB (ref >=60)
GLUCOSE: 98 mg/dL (ref 65–99)
POTASSIUM: 4.2 mmol/L (ref 3.5–5.3)
SODIUM: 143 mmol/L (ref 135–146)

## 2015-09-03 MED ORDER — CARVEDILOL 25 MG PO TABS: 50.0000 mg | ORAL_TABLET | Freq: Two times a day (BID) | ORAL | 5 refills | Status: DC

## 2015-09-03 MED ORDER — ALLOPURINOL 100 MG PO TABS: 100.0000 mg | ORAL_TABLET | Freq: Every day | ORAL | 6 refills | Status: DC

## 2015-09-03 MED ORDER — IPRATROPIUM-ALBUTEROL 0.5-2.5 (3) MG/3ML IN SOLN: 3.0000 mL | Freq: Once | RESPIRATORY_TRACT | Status: DC

## 2015-09-03 MED ORDER — BECLOMETHASONE DIPROPIONATE 80 MCG/ACT IN AERS: 2.0000 | INHALATION_SPRAY | Freq: Two times a day (BID) | RESPIRATORY_TRACT | 12 refills | Status: DC

## 2015-09-03 NOTE — Assessment & Plan Note (Signed)
BP controlled today. Continue current regimen.

## 2015-09-03 NOTE — Assessment & Plan Note (Signed)
Decreased kidney function 2/2 lasix overuse (pt was taking 200mg  BID). Will recheck BMP today. Reduce allopurinol dosing today 100mg  due to decreased kidney function.

## 2015-09-03 NOTE — Assessment & Plan Note (Signed)
30lb weight loss per [facility]. Continue to take lasix as directed by cardiology. Encouraged pt to weigh daily and call cardiology with 2lb weight gain over night or 5lb in 1 week.

## 2015-09-03 NOTE — Assessment & Plan Note (Signed)
Cautioned pt to avoid smoking.

## 2015-09-03 NOTE — Patient Instructions (Signed)
We will check your kidney function today.  I have started you on a steroid inhaler to take twice daily called Qvar. We will get you set up with a nebulizer machine at home to help with breathing.   Keep taking your lasix as directed by cardiology. Weigh yourself daily- 2lbs over night or 5 lbs in 1 week you need to call cardiology.  Please seek immediate medical attention if you develop shortness of breath not relieve by inhaler, chest pain/tightness, fever > 103 F or other concerning symptoms.   Please seek immediate medical attention at ER or Urgent Care if you develop: Chest pain, pressure or tightness. Shortness of breath accompanied by nausea or diaphoresis Visual changes Numbness or tingling on one side of the body Facial droop Altered mental status Or any concerning symptoms.  Diabetes: Eat regular meals- check your blood sugar after meals- goal is <180 2 hours after a meal.  Closely monitor your blood sugar with the lantus.  Please check your blood glucose 2 times daily. If your glucose is < 70 mg/dl or you have symptoms of hypoglycemia confusion, dizziness, headache, hunger and jitteriness please drink 4 oz of juice or soda.  Check blood glucose 15 minutes later. If it has not risen to >100, please seek medical attention. If > 100 please eat a snack containing protein such as peanut butter and crackers.

## 2015-09-03 NOTE — Progress Notes (Signed)
Subjective:    Patient ID: [name], male    DOB: [birth year], 50 y.o.   MRN: [id number]  HPI: [name] is a 50 y.o. male presenting on 09/03/2015 for Diabetes   HPI  Pt presents for diabetes follow-up but is having cough and congestion. Pt recently had CHF exacerbation and diuresed 30lbs of fluid. Sats on Rm air are 90% today.  Pt is coughing. At last cardiology visit- this was his baseline.  Pt reports he is coughing and wheezing due to the cologne.  Pt is taking 154m of lasix twice daily. [facility]  Blood sugars are running - Avg Am sugar is 80. Takes at 5Dawn No hypoglycemia.   Past Medical History:  Diagnosis Date  . Allergy   . Asthma   . [place] ([place]   . [place] ([place]   . Gout   . Hypertension   . [place]bid [place] ([place]   . Reflux     Current Outpatient Prescriptions on File Prior to Visit  Medication Sig  . albuterol (VENTOLIN HFA) 108 (90 Base) MCG/ACT inhaler Inhale 2 puffs into the lungs every 4 (four) hours as needed for wheezing or shortness of breath.  .[name] Kitchenaspirin 81 MG chewable tablet Chew 81 mg by mouth daily.  . blood glucose meter kit and supplies KIT Brand of choice; LON 99 months; check FSBS 3x a day; E11.65; disp one meter with 100 strips + 1 refill of strips  . fluticasone (FLONASE) 50 MCG/ACT nasal spray Place 2 sprays into both nostrils daily.  . furosemide (LASIX) 20 MG tablet Take 1 tablet (20 mg total) by mouth daily.  . furosemide (LASIX) 40 MG tablet Take 2 tablets (80 mg total) by mouth 2 (two) times daily. Reported on 01/17/2015  . Insulin Glargine (LANTUS) 100 UNIT/ML Solostar Pen Inject 18 Units into the skin daily at 10 pm.  . Insulin Pen Needle 31G X 8 MM MISC by Does not apply route.  .[name] Kitchenlisinopril (PRINIVIL,ZESTRIL) 30 MG tablet Take 1 tablet (30 mg total) by mouth daily.  .[name] Kitchenomeprazole (PRILOSEC) 40 MG capsule Take 1 capsule (40 mg total) by  mouth daily.  . ranitidine (ZANTAC) 150 MG capsule Take 1 capsule (150 mg total) by mouth every evening.  .[name] Kitchenspironolactone (ALDACTONE) 25 MG tablet Take 25 mg by mouth daily.  . TRULICITY 04.31MVQ/0.0QQSOPN Inject 0.75 mg into the skin once a week.  . vitamin C (ASCORBIC ACID) 500 MG tablet Take 1,000 mg by mouth daily.  . Vitamin D, Ergocalciferol, (DRISDOL) 50000 units CAPS capsule Take 1 capsule (50,000 Units total) by mouth every 30 (thirty) days.  . Zinc 50 MG CAPS Take 50 mg by mouth daily.   No current facility-administered medications on file prior to visit.     Review of Systems  Constitutional: Negative for chills and fever.  HENT: Positive for sinus pressure.   Respiratory: Positive for shortness of breath and wheezing. Negative for chest tightness.   Cardiovascular: Positive for leg swelling. Negative for chest pain and palpitations.  Gastrointestinal: Negative for abdominal pain, nausea and vomiting.  Endocrine: Negative.   Genitourinary: Negative for discharge, dysuria, penile pain, testicular pain and urgency.  Musculoskeletal: Negative for arthralgias, back pain and joint swelling.  Skin: Negative.   Neurological: Negative for dizziness, weakness, numbness and headaches.  Psychiatric/Behavioral: Negative for dysphoric mood and sleep disturbance.   Per HPI unless specifically indicated  above     Objective:    BP 129/85 (BP Location: Right Arm, Patient Position: Sitting, Cuff Size: Large)   Pulse 79   Temp 98.5 F (36.9 C) (Oral)   Resp 16   Ht _0  (1.753 m)   Wt (!) 385 lb 9.6 oz (174.9 kg)   SpO2 94%   BMI 56.94 kg/m   Wt Readings from Last 3 Encounters:  09/03/15 (!) 385 lb 9.6 oz (174.9 kg)  08/06/15 (!) 429 lb 9.6 oz (194.9 kg)  06/04/15 (!) 382 lb 9.6 oz (173.5 kg)    Physical Exam  Constitutional: He is oriented to person, place, and time. He appears well-developed and well-nourished. No distress.  HENT:  Head: Normocephalic and atraumatic.    Neck: Neck supple. No thyromegaly present.  Cardiovascular: Normal rate, regular rhythm and normal heart sounds.  Exam reveals no gallop and no friction rub.   No murmur heard. Pulmonary/Chest: Effort normal. He has no decreased breath sounds. He has wheezes (expiratory). He has no rhonchi. He has no rales.  Abdominal: Soft. Bowel sounds are normal. He exhibits no distension. There is no tenderness. There is no rebound.  Musculoskeletal: Normal range of motion. He exhibits edema (+1 BLE). He exhibits no tenderness.  Neurological: He is alert and oriented to person, place, and time. He has normal reflexes.  Skin: Skin is warm and dry. No rash noted. No erythema.  Psychiatric: He has a normal mood and affect. His behavior is normal. Thought content normal.   Results for orders placed or performed in visit on 08/06/15  COMPLETE METABOLIC PANEL WITH GFR  Result Value Ref Range   Sodium 144 135 - 146 mmol/L   Potassium 4.0 3.5 - 5.3 mmol/L   Chloride 95 (L) 98 - 110 mmol/L   CO2 40 (H) 20 - 31 mmol/L   Glucose, Bld 135 (H) 65 - 99 mg/dL   BUN 32 (H) 7 - 25 mg/dL   Creat 1.97 (H) 0.60 - 1.35 mg/dL   Total Bilirubin 1.0 0.2 - 1.2 mg/dL   Alkaline Phosphatase 160 (H) 40 - 115 U/L   AST 20 10 - 40 U/L   ALT 27 9 - 46 U/L   Total Protein 5.7 (L) 6.1 - 8.1 g/dL   Albumin 2.9 (L) 3.6 - 5.1 g/dL   Calcium 8.3 (L) 8.6 - 10.3 mg/dL   GFR, Est African American 45 (L) >=60 mL/min   GFR, Est Non African American 39 (L) >=60 mL/min  Lipid Profile  Result Value Ref Range   Cholesterol 78 (L) 125 - 200 mg/dL   Triglycerides 57 <150 mg/dL   HDL 40 >=40 mg/dL   Total CHOL/HDL Ratio 2.0 <=5.0 Ratio   VLDL 11 <30 mg/dL   LDL Cholesterol 27 <130 mg/dL  Uric acid  Result Value Ref Range   Uric Acid, Serum 5.0 4.0 - 8.0 mg/dL  VITAMIN D 25 Hydroxy (Vit-D Deficiency, Fractures)  Result Value Ref Range   Vit D, 25-Hydroxy 30 30 - 100 ng/mL  POCT HgB A1C  Result Value Ref Range   Hemoglobin A1C 8.3        Assessment & Plan:   Problem List Items Addressed This Visit      Cardiovascular and Mediastinum   Benign hypertension    BP controlled today. Continue current regimen.       Relevant Medications   carvedilol (COREG) 25 MG tablet   Acute on chronic systolic heart failure (HCC)    30lb  weight [facility] per [facility]. Continue to take lasix as directed by cardiology. Encouraged pt to weigh daily and call cardiology with 2lb weight gain over night or 5lb in 1 week.       Relevant Medications   carvedilol (COREG) 25 MG tablet   Other Relevant Orders   DG Chest 2 View (Completed)   BASIC METABOLIC PANEL WITH GFR     Respiratory   Asthma    Mild exacerbation- avoid prednisone due to uncertain fluid status and fluid retention. Start Qvar BID and ordered albuterol nebulizer. PRN. Alarm symptoms reviewed. RTC if not improving.       Relevant Medications   ipratropium-albuterol (DUONEB) 0.5-2.5 (3) MG/3ML nebulizer solution 3 mL   beclomethasone (QVAR) 80 MCG/ACT inhaler   Other Relevant Orders   DME Nebulizer machine     Endocrine   Uncontrolled type 2 diabetes mellitus with proteinuria or albuminuria    Continue 18units of lantus daily. Monitor AM sugars closely. Hypoglycemia protocol counseled. Pt encourage to test post-prandial sugars. Eat regular meals. Recheck 1 mos.         Genitourinary   Chronic kidney disease (CKD), stage III (moderate)    Decreased kidney function 2/2 lasix overuse (pt was taking 247m BID). Will recheck BMP today. Reduce allopurinol dosing today 1025mdue to decreased kidney function.         Other   Tobacco abuse, episodic    Cautioned pt to avoid smoking.        Other Visit Diagnoses    Shortness of breath    -  Primary   Relevant Medications   ipratropium-albuterol (DUONEB) 0.5-2.5 (3) MG/3ML nebulizer solution 3 mL   beclomethasone (QVAR) 80 MCG/ACT inhaler   Other Relevant Orders   DG Chest 2 View (Completed)      Meds ordered this  encounter  Medications  . ipratropium-albuterol (DUONEB) 0.5-2.5 (3) MG/3ML nebulizer solution 3 mL  . beclomethasone (QVAR) 80 MCG/ACT inhaler    Sig: Inhale 2 puffs into the lungs 2 (two) times daily.    Dispense:  1 Inhaler    Refill:  12    Order Specific Question:   Supervising Provider    Answer:   HAArlis [phone]. allopurinol (ZYLOPRIM) 100 MG tablet    Sig: Take 1 tablet (100 mg total) by mouth daily.    Dispense:  30 tablet    Refill:  6    Order Specific Question:   Supervising Provider    Answer:   HAArlis [phone]. carvedilol (COREG) 25 MG tablet    Sig: Take 2 tablets (50 mg total) by mouth 2 (two) times daily with a meal.    Dispense:  120 tablet    Refill:  5    Order Specific Question:   Supervising Provider    Answer:   HAArlis Porta9[[id number]]    Follow up plan: Return in about 4 weeks (around 10/01/2015) for Asthma.No

## 2015-09-03 NOTE — Assessment & Plan Note (Signed)
Mild exacerbation- avoid prednisone due to uncertain fluid status and fluid retention. Start Qvar BID and ordered albuterol nebulizer. PRN. Alarm symptoms reviewed. RTC if not improving.

## 2015-09-03 NOTE — Assessment & Plan Note (Signed)
Continue 18units of lantus daily. Monitor AM sugars closely. Hypoglycemia protocol counseled. Pt encourage to test post-prandial sugars. Eat regular meals. Recheck 1 mos.

## 2015-09-04 ENCOUNTER — Telehealth: Payer: Self-pay | Admitting: Family Medicine

## 2015-09-04 NOTE — Telephone Encounter (Signed)
Error

## 2015-09-05 ENCOUNTER — Telehealth: Payer: Self-pay | Admitting: Family Medicine

## 2015-09-05 NOTE — Telephone Encounter (Signed)
Pt asked for a call back about kidney function results.  The results the nurse gave him were different than what the cardiologist told.him.  His call back number is [phone]

## 2015-09-05 NOTE — Telephone Encounter (Signed)
Called pt to discuss. He is taking "a lot of aleve" daily.  Advised him to avoid any NSAIDs and drink plenty of fluids.

## 2015-09-13 ENCOUNTER — Telehealth: Payer: Self-pay | Admitting: Family Medicine

## 2015-09-13 NOTE — Telephone Encounter (Signed)
No he cannot. Given his current kidney function, I would avoid ibuprofen. He can use topical stuff like icy hot, OTC lidocaine patches, etc. This should help with symptoms.   Also- please tell him to call sleep medicine. They have contacted him several times to schedule appt and have sent him a letter because he will not call them back. Thanks! AK

## 2015-09-13 NOTE — Telephone Encounter (Signed)
Pt.called wanted to know if he could take anything else besides tylenol because that was not working.  Pt call back # is [phone]

## 2015-09-13 NOTE — Telephone Encounter (Signed)
Patient informed that he can try otc topical pain relief. He says he will not be able to have sleep study done at [facility] b/c he does not sleep during the night. They only do sleep study at night. Advise

## 2015-09-18 ENCOUNTER — Ambulatory Visit: Payer: Managed Care, Other (non HMO) | Admitting: Gastroenterology

## 2015-10-07 ENCOUNTER — Telehealth: Payer: Self-pay | Admitting: Family Medicine

## 2015-10-07 NOTE — Telephone Encounter (Signed)
Pt is taking medications incorrectly per the pharmacist.  They had the wrong dose of carvedilol.  Not taking Trulicity once weekly.  Neuropathy c/o in feet. May need gabapentin.

## 2015-10-10 ENCOUNTER — Ambulatory Visit (INDEPENDENT_AMBULATORY_CARE_PROVIDER_SITE_OTHER): Payer: Medicaid Other | Admitting: Family Medicine

## 2015-10-10 ENCOUNTER — Encounter: Payer: Self-pay | Admitting: Family Medicine

## 2015-10-10 VITALS — BP 126/76 | HR 80 | Temp 98.5°F | Resp 16 | Ht 69.0 in | Wt 379.6 lb

## 2015-10-10 DIAGNOSIS — N183 Chronic kidney disease, stage 3 unspecified: Secondary | ICD-10-CM

## 2015-10-10 DIAGNOSIS — E785 Hyperlipidemia, unspecified: Secondary | ICD-10-CM | POA: Diagnosis not present

## 2015-10-10 DIAGNOSIS — E1165 Type 2 diabetes mellitus with hyperglycemia: Secondary | ICD-10-CM | POA: Diagnosis not present

## 2015-10-10 DIAGNOSIS — I5023 Acute on chronic systolic (congestive) heart failure: Secondary | ICD-10-CM

## 2015-10-10 DIAGNOSIS — G4719 Other hypersomnia: Secondary | ICD-10-CM

## 2015-10-10 DIAGNOSIS — J302 Other seasonal allergic rhinitis: Secondary | ICD-10-CM | POA: Diagnosis not present

## 2015-10-10 DIAGNOSIS — J4530 Mild persistent asthma, uncomplicated: Secondary | ICD-10-CM | POA: Diagnosis not present

## 2015-10-10 DIAGNOSIS — R809 Proteinuria, unspecified: Secondary | ICD-10-CM

## 2015-10-10 DIAGNOSIS — IMO0001 Reserved for inherently not codable concepts without codable children: Secondary | ICD-10-CM

## 2015-10-10 MED ORDER — ALBUTEROL SULFATE (2.5 MG/3ML) 0.083% IN NEBU: 2.5000 mg | INHALATION_SOLUTION | Freq: Four times a day (QID) | RESPIRATORY_TRACT | 1 refills | Status: DC | PRN

## 2015-10-10 MED ORDER — LORATADINE 10 MG PO TABS: 10.0000 mg | ORAL_TABLET | Freq: Every day | ORAL | 11 refills | Status: DC

## 2015-10-10 NOTE — Assessment & Plan Note (Signed)
Stable with Qvar. Renewed albuterol. Doing well.

## 2015-10-10 NOTE — Assessment & Plan Note (Signed)
Pt not currently on statin. Check CMP today and start statin therapy pending lab work.

## 2015-10-10 NOTE — Assessment & Plan Note (Signed)
Reminded patient to take Trulicity once weekly. Encouraged diet and lifestyle changes. Check A1c 1 mos.

## 2015-10-10 NOTE — Assessment & Plan Note (Signed)
Sleep study is pending. Pt had brought up the concern that he does not sleep well at night. Sleep medicine reported he refused the study. Pt has declined a home study in the past. Have called sleep medicine to follow-up. Consider referral to pulmonology to be set up for sleep study.

## 2015-10-10 NOTE — Patient Instructions (Signed)
Let itching: I think you are allergic to the stockings.  I don't think this is neuropathy. Take claritin daily to help with your symptoms. Also try a day without the stockings.   Kidney function: We will check your kidney function today.   Diabetes: Please take your trulicity weekly. We can switch to the daily preparation if it is easier to remember.

## 2015-10-10 NOTE — Progress Notes (Signed)
Subjective:    Patient ID: [name], male    DOB: Jan 22, 1966, 50 y.o.   MRN: [id number]  HPI: [name] is a 50 y.o. male presenting on 10/10/2015 for Follow-up   HPI  Pt presents follow-up. He is too early for diabetes follow-up.  He has not been taking his trulicity - he forgets. May explain increasing A1c. Is having numbness and tingling and feet.  Reports whole body itching and tingling at times. Only when he wears his compression stockings.  His nephrology appt was moved from August to September. Will recheck kidney function today.   Past Medical History:  Diagnosis Date  . Allergy   . Asthma   . Diabetes mellitus without complication ([place])   . Dilated cardiomyopathy ([place])   . Gout   . Hypertension   . Morbid obesity ([place])   . Reflux     Current Outpatient Prescriptions on File Prior to Visit  Medication Sig  . albuterol (VENTOLIN HFA) 108 (90 Base) MCG/ACT inhaler Inhale 2 puffs into the lungs every 4 (four) hours as needed for wheezing or shortness of breath.  . allopurinol (ZYLOPRIM) 100 MG tablet Take 1 tablet (100 mg total) by mouth daily.  [name] Kitchen aspirin 81 MG chewable tablet Chew 81 mg by mouth daily.  . beclomethasone (QVAR) 80 MCG/ACT inhaler Inhale 2 puffs into the lungs 2 (two) times daily.  . blood glucose meter kit and supplies KIT Brand of choice; LON 99 months; check FSBS 3x a day; E11.65; disp one meter with 100 strips + 1 refill of strips  . carvedilol (COREG) 25 MG tablet Take 2 tablets (50 mg total) by mouth 2 (two) times daily with a meal.  . fluticasone (FLONASE) 50 MCG/ACT nasal spray Place 2 sprays into both nostrils daily.  . furosemide (LASIX) 20 MG tablet Take 1 tablet (20 mg total) by mouth daily.  . furosemide (LASIX) 40 MG tablet Take 2 tablets (80 mg total) by mouth 2 (two) times daily. Reported on 01/17/2015  . Insulin Glargine (LANTUS) 100 UNIT/ML Solostar Pen Inject 18 Units into the skin daily at 10 pm.  . Insulin Pen Needle 31G  X 8 MM MISC by Does not apply route.  [name] Kitchen lisinopril (PRINIVIL,ZESTRIL) 30 MG tablet Take 1 tablet (30 mg total) by mouth daily.  [name] Kitchen omeprazole (PRILOSEC) 40 MG capsule Take 1 capsule (40 mg total) by mouth daily.  . ranitidine (ZANTAC) 150 MG capsule Take 1 capsule (150 mg total) by mouth every evening.  [name] Kitchen spironolactone (ALDACTONE) 25 MG tablet Take 25 mg by mouth daily.  . TRULICITY 9.77 SF/4.2LT SOPN Inject 0.75 mg into the skin once a week.  . vitamin C (ASCORBIC ACID) 500 MG tablet Take 1,000 mg by mouth daily.  . Vitamin D, Ergocalciferol, (DRISDOL) 50000 units CAPS capsule Take 1 capsule (50,000 Units total) by mouth every 30 (thirty) days.  . Zinc 50 MG CAPS Take 50 mg by mouth daily.   Current Facility-Administered Medications on File Prior to Visit  Medication  . ipratropium-albuterol (DUONEB) 0.5-2.5 (3) MG/3ML nebulizer solution 3 mL    Review of Systems  Constitutional: Negative for chills and fever.  HENT: Negative.   Respiratory: Negative for chest tightness, shortness of breath and wheezing.   Cardiovascular: Negative for chest pain, palpitations and leg swelling.  Gastrointestinal: Negative for abdominal pain, nausea and vomiting.  Endocrine: Negative.   Genitourinary: Negative for discharge, dysuria, penile pain, testicular pain and urgency.  Musculoskeletal: Negative for arthralgias,  back pain and joint swelling.  Skin: Negative.   Neurological: Negative for dizziness, weakness, numbness and headaches.  Psychiatric/Behavioral: Negative for dysphoric mood and sleep disturbance.   Per HPI unless specifically indicated above     Objective:    BP 126/76 (BP Location: Right Arm, Patient Position: Sitting, Cuff Size: Normal)   Pulse 80   Temp 98.5 F (36.9 C) (Oral)   Resp 16   Ht _0  (1.753 m)   Wt (!) 379 lb 9.6 oz (172.2 kg)   SpO2 94%   BMI 56.06 kg/m   Wt Readings from Last 3 Encounters:  10/10/15 (!) 379 lb 9.6 oz (172.2 kg)  09/03/15 (!) 385 lb 9.6 oz  (174.9 kg)  08/06/15 (!) 429 lb 9.6 oz (194.9 kg)    Physical Exam  Constitutional: He is oriented to person, place, and time. He appears well-developed and well-nourished. No distress.  HENT:  Head: Normocephalic and atraumatic.  Neck: Neck supple. No thyromegaly present.  Cardiovascular: Normal rate, regular rhythm and normal heart sounds.  Exam reveals no gallop and no friction rub.   No murmur heard. Pulmonary/Chest: Effort normal and breath sounds normal. He has no wheezes.  Abdominal: Soft. Bowel sounds are normal. He exhibits no distension. There is no tenderness. There is no rebound.  Musculoskeletal: Normal range of motion. He exhibits no tenderness.       Right lower leg: He exhibits edema (+1 pitting edema).       Left lower leg: He exhibits edema (+1 pitting edema).  Neurological: He is alert and oriented to person, place, and time. He has normal reflexes.  Skin: Skin is warm and dry. No rash noted. No erythema.  Psychiatric: He has a normal mood and affect. His behavior is normal. Thought content normal.   Results for orders placed or performed in visit on 08/67/61  BASIC METABOLIC PANEL WITH GFR  Result Value Ref Range   Sodium 143 135 - 146 mmol/L   Potassium 4.2 3.5 - 5.3 mmol/L   Chloride 93 (L) 98 - 110 mmol/L   CO2 38 (H) 20 - 31 mmol/L   Glucose, Bld 98 65 - 99 mg/dL   BUN 39 (H) 7 - 25 mg/dL   Creat 2.64 (H) 0.60 - 1.35 mg/dL   Calcium 8.9 8.6 - 10.3 mg/dL   GFR, Est African American 32 (L) >=60 mL/min   GFR, Est Non African American 27 (L) >=60 mL/min      Assessment & Plan:   Problem List Items Addressed This Visit      Cardiovascular and Mediastinum   Acute on chronic systolic heart failure (HCC)    Pt encouraged to continue to weigh himself daily. Call cardiology with weight gain. Take lasix as prescribed. Keep cardiology appt.         Respiratory   Asthma - Primary    Stable with Qvar. Renewed albuterol. Doing well.       Relevant Medications    albuterol (PROVENTIL) (2.5 MG/3ML) 0.083% nebulizer solution     Endocrine   Uncontrolled type 2 diabetes mellitus with proteinuria or albuminuria    Reminded patient to take Trulicity once weekly. Encouraged diet and lifestyle changes. Check A1c 1 mos.         Genitourinary   Chronic kidney disease (CKD), stage III (moderate)    Missed nephrology appt will see them next month. Check Kidney function today. Advised pt to avoid NSAIDs. Stay hydrated.       Relevant  Orders   COMPLETE METABOLIC PANEL WITH GFR     Other   HLD (hyperlipidemia)    Pt not currently on statin. Check CMP today and start statin therapy pending lab work.       Excessive daytime sleepiness    Sleep study is pending. Pt had brought up the concern that he does not sleep well at night. Sleep medicine reported he refused the study. Pt has declined a home study in the past. Have called sleep medicine to follow-up. Consider referral to pulmonology to be set up for sleep study.        Other Visit Diagnoses    Seasonal allergies       Relevant Medications   loratadine (CLARITIN) 10 MG tablet      Meds ordered this encounter  Medications  . albuterol (PROVENTIL) (2.5 MG/3ML) 0.083% nebulizer solution    Sig: Take 3 mLs (2.5 mg total) by nebulization every 6 (six) hours as needed for wheezing or shortness of breath.    Dispense:  150 mL    Refill:  1    Order Specific Question:   Supervising Provider    Answer:   Arlis Porta [[id number]]  . loratadine (CLARITIN) 10 MG tablet    Sig: Take 1 tablet (10 mg total) by mouth daily.    Dispense:  30 tablet    Refill:  11    Order Specific Question:   Supervising Provider    Answer:   Arlis Porta [phone]      Follow up plan: Return in about 4 weeks (around 11/07/2015) for Diabetes. [name] Kitchen

## 2015-10-10 NOTE — Assessment & Plan Note (Signed)
Pt encouraged to continue to weigh himself daily. Call cardiology with weight gain. Take lasix as prescribed. Keep cardiology appt.

## 2015-10-10 NOTE — Assessment & Plan Note (Signed)
Missed nephrology appt will see them next month. Check Kidney function today. Advised pt to avoid NSAIDs. Stay hydrated.

## 2015-10-11 LAB — COMPLETE METABOLIC PANEL WITH GFR
ALT: 16 U/L (ref 9–46)
AST: 15 U/L (ref 10–40)
Albumin: 3.2 g/dL — ABNORMAL LOW (ref 3.6–5.1)
Alkaline Phosphatase: 132 U/L — ABNORMAL HIGH (ref 40–115)
BUN: 34 mg/dL — AB (ref 7–25)
CALCIUM: 8.7 mg/dL (ref 8.6–10.3)
CO2: 34 mmol/L — AB (ref 20–31)
Chloride: 95 mmol/L — ABNORMAL LOW (ref 98–110)
Creat: 2.24 mg/dL — ABNORMAL HIGH (ref 0.60–1.35)
GFR, EST NON AFRICAN AMERICAN: 33 mL/min — AB (ref >=60)
GFR, Est African American: 39 mL/min — ABNORMAL LOW (ref >=60)
GLUCOSE: 144 mg/dL — AB (ref 65–99)
POTASSIUM: 4.5 mmol/L (ref 3.5–5.3)
SODIUM: 142 mmol/L (ref 135–146)
Total Bilirubin: 0.8 mg/dL (ref 0.2–1.2)
Total Protein: 6.8 g/dL (ref 6.1–8.1)

## 2015-10-18 ENCOUNTER — Telehealth: Payer: Self-pay | Admitting: Family Medicine

## 2015-10-18 NOTE — Telephone Encounter (Signed)
Called pt regarding sleep study. LMTCB.   [facility] will not do sleep study during the day. We We can send him to Pulmonology to get set up with a home sleep study in the office.  If he is amenable, I will place that referral.

## 2015-10-23 ENCOUNTER — Ambulatory Visit: Payer: Medicaid Other | Admitting: Gastroenterology

## 2015-10-29 ENCOUNTER — Other Ambulatory Visit: Payer: Self-pay | Admitting: *Deleted

## 2015-10-29 ENCOUNTER — Telehealth: Payer: Self-pay | Admitting: Family Medicine

## 2015-10-29 MED ORDER — EXENATIDE ER 2 MG ~~LOC~~ PEN: 2.0000 mg | PEN_INJECTOR | SUBCUTANEOUS | 3 refills | Status: DC

## 2015-10-29 NOTE — Telephone Encounter (Signed)
PA- denied Per Dr. Luan Pulling send [name]. Patient aware and informed he should consult with pharmacy on instruction.

## 2015-10-29 NOTE — Progress Notes (Signed)
bb

## 2015-10-29 NOTE — Telephone Encounter (Signed)
Pt said his trulicity needs a prior authorization.  He has medicaid and uses [employer].  His call back number is [phone]

## 2015-11-08 ENCOUNTER — Ambulatory Visit (INDEPENDENT_AMBULATORY_CARE_PROVIDER_SITE_OTHER): Payer: Medicaid Other | Admitting: Family Medicine

## 2015-11-08 ENCOUNTER — Encounter: Payer: Self-pay | Admitting: *Deleted

## 2015-11-08 ENCOUNTER — Encounter: Payer: Self-pay | Admitting: Family Medicine

## 2015-11-08 VITALS — BP 129/80 | HR 83 | Temp 98.3°F | Resp 16 | Ht 69.0 in | Wt 383.0 lb

## 2015-11-08 DIAGNOSIS — E1122 Type 2 diabetes mellitus with diabetic chronic kidney disease: Secondary | ICD-10-CM

## 2015-11-08 DIAGNOSIS — E1165 Type 2 diabetes mellitus with hyperglycemia: Secondary | ICD-10-CM

## 2015-11-08 DIAGNOSIS — N183 Chronic kidney disease, stage 3 unspecified: Secondary | ICD-10-CM

## 2015-11-08 DIAGNOSIS — E782 Mixed hyperlipidemia: Secondary | ICD-10-CM | POA: Diagnosis not present

## 2015-11-08 DIAGNOSIS — R748 Abnormal levels of other serum enzymes: Secondary | ICD-10-CM | POA: Diagnosis not present

## 2015-11-08 DIAGNOSIS — I10 Essential (primary) hypertension: Secondary | ICD-10-CM

## 2015-11-08 DIAGNOSIS — J4520 Mild intermittent asthma, uncomplicated: Secondary | ICD-10-CM | POA: Diagnosis not present

## 2015-11-08 DIAGNOSIS — G4719 Other hypersomnia: Secondary | ICD-10-CM

## 2015-11-08 DIAGNOSIS — R0683 Snoring: Secondary | ICD-10-CM

## 2015-11-08 DIAGNOSIS — R7981 Abnormal blood-gas level: Secondary | ICD-10-CM

## 2015-11-08 DIAGNOSIS — I5022 Chronic systolic (congestive) heart failure: Secondary | ICD-10-CM

## 2015-11-08 DIAGNOSIS — Z794 Long term (current) use of insulin: Secondary | ICD-10-CM | POA: Diagnosis not present

## 2015-11-08 DIAGNOSIS — IMO0002 Reserved for concepts with insufficient information to code with codable children: Secondary | ICD-10-CM

## 2015-11-08 LAB — HEPATIC FUNCTION PANEL
ALBUMIN: 3.1 g/dL — AB (ref 3.6–5.1)
ALK PHOS: 108 U/L (ref 40–115)
ALT: 14 U/L (ref 9–46)
AST: 13 U/L (ref 10–40)
BILIRUBIN INDIRECT: 0.5 mg/dL (ref 0.2–1.2)
BILIRUBIN TOTAL: 0.8 mg/dL (ref 0.2–1.2)
Bilirubin, Direct: 0.3 mg/dL — ABNORMAL HIGH (ref <=0.2)
Total Protein: 6.7 g/dL (ref 6.1–8.1)

## 2015-11-08 LAB — BASIC METABOLIC PANEL WITH GFR
BUN: 40 mg/dL — AB (ref 7–25)
CHLORIDE: 93 mmol/L — AB (ref 98–110)
CO2: 42 mmol/L — ABNORMAL HIGH (ref 20–31)
Calcium: 9.1 mg/dL (ref 8.6–10.3)
Creat: 2.29 mg/dL — ABNORMAL HIGH (ref 0.60–1.35)
GFR, Est African American: 38 mL/min — ABNORMAL LOW (ref >=60)
GFR, Est Non African American: 33 mL/min — ABNORMAL LOW (ref >=60)
GLUCOSE: 167 mg/dL — AB (ref 65–99)
POTASSIUM: 3.8 mmol/L (ref 3.5–5.3)
Sodium: 144 mmol/L (ref 135–146)

## 2015-11-08 LAB — POCT GLYCOSYLATED HEMOGLOBIN (HGB A1C): HEMOGLOBIN A1C: 7.9

## 2015-11-08 MED ORDER — ATORVASTATIN CALCIUM 20 MG PO TABS: 20.0000 mg | ORAL_TABLET | Freq: Every day | ORAL | 11 refills | Status: DC

## 2015-11-08 NOTE — Assessment & Plan Note (Addendum)
Recent nephrology appt was moved to October per the patient. Pt reports he has been taking NSAIDs occasionally at the direction of cardiology. Will recheck kidney function today.  Based on last serum Cr- CrCl is 63.13ml/min using Cockcroft Gault using body weight adjustment for obesity. Based on acutal body weight it is estimated to be 99.1ml/min Okay to continue bydureon, lisinopril at this time.

## 2015-11-08 NOTE — Assessment & Plan Note (Signed)
Encouraged continued efforts for weight loss and healthy lifestyle.

## 2015-11-08 NOTE — Assessment & Plan Note (Signed)
A1c down to 7.9%- mild improvement. Ideally A1c wil continue to improve with daily use of Bydureon. Continue with Lantus 18 units per day. Pt needs diabetic eye exam- he will call [facility] associates to schedule.  Foot exam UTD. Urine microalbumin UTD. Recheck 3 mos.

## 2015-11-08 NOTE — Assessment & Plan Note (Signed)
Probable obesity hypoventilation syndrome. Requesting pulmonology consult at this time. Pt also needs sleep study.

## 2015-11-08 NOTE — Progress Notes (Signed)
Subjective:    Patient ID: [name], male    DOB: January 28, 1965, 50 y.o.   MRN: [id number]  HPI: [name] is a 50 y.o. male presenting on 11/08/2015 for Diabetes (highest BS 124 and lowest 43 )   HPI  Pt presents for diabetes follow-up today. Last A1c was 8.3% now down to 7.9%.  His trulicity was denied by medicaid. Bydureon was prescribed and PA was done but he has not picked it up yet. Pt was previously only taking Trulicity PRN- taken weekly for the past 1 mos.  Urine microalbumin- negative. Needs diabetic eye exam. He has an eye doctor- not sure if he accepts medicaid. Sees [employer]. Taking 18 units of lantus at night.  Needs to start a statin for CAD and diabetes.  Alk phosphatase was elevated at last visit- unclear if this was bone or liver related. No previous h/o liver disease. No abdominal pain.   Sleep study- pt has not gotten done due to concerns about sleeping at night. He declined a study with sleep medicine.    Past Medical History:  Diagnosis Date  . Allergy   . Asthma   . Diabetes mellitus without complication ([place])   . Dilated cardiomyopathy ([place])   . Gout   . Hypertension   . Morbid obesity ([place])   . Reflux     Current Outpatient Prescriptions on File Prior to Visit  Medication Sig  . albuterol (PROVENTIL) (2.5 MG/3ML) 0.083% nebulizer solution Take 3 mLs (2.5 mg total) by nebulization every 6 (six) hours as needed for wheezing or shortness of breath.  [name] Kitchen albuterol (VENTOLIN HFA) 108 (90 Base) MCG/ACT inhaler Inhale 2 puffs into the lungs every 4 (four) hours as needed for wheezing or shortness of breath.  . allopurinol (ZYLOPRIM) 100 MG tablet Take 1 tablet (100 mg total) by mouth daily.  [name] Kitchen aspirin 81 MG chewable tablet Chew 81 mg by mouth daily.  . beclomethasone (QVAR) 80 MCG/ACT inhaler Inhale 2 puffs into the lungs 2 (two) times daily.  . blood glucose meter kit and supplies KIT Brand of choice; LON 99 months; check FSBS 3x a day;  E11.65; disp one meter with 100 strips + 1 refill of strips  . carvedilol (COREG) 25 MG tablet Take 2 tablets (50 mg total) by mouth 2 (two) times daily with a meal.  . Exenatide ER (BYDUREON) 2 MG PEN Inject 2 mg into the skin once a week.  . fluticasone (FLONASE) 50 MCG/ACT nasal spray Place 2 sprays into both nostrils daily.  . furosemide (LASIX) 20 MG tablet Take 1 tablet (20 mg total) by mouth daily.  . furosemide (LASIX) 40 MG tablet Take 2 tablets (80 mg total) by mouth 2 (two) times daily. Reported on 01/17/2015  . Insulin Glargine (LANTUS) 100 UNIT/ML Solostar Pen Inject 18 Units into the skin daily at 10 pm.  . Insulin Pen Needle 31G X 8 MM MISC by Does not apply route.  [name] Kitchen lisinopril (PRINIVIL,ZESTRIL) 30 MG tablet Take 1 tablet (30 mg total) by mouth daily.  [name] Kitchen loratadine (CLARITIN) 10 MG tablet Take 1 tablet (10 mg total) by mouth daily.  [name] Kitchen omeprazole (PRILOSEC) 40 MG capsule Take 1 capsule (40 mg total) by mouth daily.  . ranitidine (ZANTAC) 150 MG capsule Take 1 capsule (150 mg total) by mouth every evening.  [name] Kitchen spironolactone (ALDACTONE) 25 MG tablet Take 25 mg by mouth daily.  . vitamin C (ASCORBIC ACID) 500 MG tablet Take 1,000 mg  by mouth daily.  . Vitamin D, Ergocalciferol, (DRISDOL) 50000 units CAPS capsule Take 1 capsule (50,000 Units total) by mouth every 30 (thirty) days.  . Zinc 50 MG CAPS Take 50 mg by mouth daily.   Current Facility-Administered Medications on File Prior to Visit  Medication  . ipratropium-albuterol (DUONEB) 0.5-2.5 (3) MG/3ML nebulizer solution 3 mL    Review of Systems Per HPI unless specifically indicated above     Objective:    BP 129/80 (BP Location: Left Arm, Patient Position: Sitting, Cuff Size: Large)   Pulse 83   Temp 98.3 F (36.8 C) (Oral)   Resp 16   Ht '5\' 9"'  (1.753 m)   Wt (!) 383 lb (173.7 kg)   BMI 56.56 kg/m   Wt Readings from Last 3 Encounters:  11/08/15 (!) 383 lb (173.7 kg)  10/10/15 (!) 379 lb 9.6 oz (172.2 kg)    09/03/15 (!) 385 lb 9.6 oz (174.9 kg)    Physical Exam  Constitutional: He is oriented to person, place, and time. He appears well-developed and well-nourished. No distress.  HENT:  Head: Normocephalic and atraumatic.  Neck: Neck supple. No thyromegaly present.  Cardiovascular: Normal rate, regular rhythm and normal heart sounds.  Exam reveals no gallop and no friction rub.   No murmur heard. Pulmonary/Chest: Effort normal and breath sounds normal. He has no wheezes.  Abdominal: Soft. Bowel sounds are normal. He exhibits no distension. There is no tenderness. There is no rebound.  Musculoskeletal: Normal range of motion. He exhibits no edema or tenderness.  Neurological: He is alert and oriented to person, place, and time. He has normal reflexes.  Skin: Skin is warm and dry. No rash noted. No erythema.  Psychiatric: He has a normal mood and affect. His behavior is normal. Thought content normal.   Results for orders placed or performed in visit on 11/08/15  POCT HgB A1C  Result Value Ref Range   Hemoglobin A1C 7.9       Assessment & Plan:   Problem List Items Addressed This Visit      Cardiovascular and Mediastinum   Chronic systolic heart failure ([place])    Followed at [facility]. Weight is up 4lbs from last visit. Encouraged pt to weigh self daily in the morning and follow cardiology instructions regarding to contact physician. Take lasix only as prescribed by cardiology- do not take extra lasix unless instructed by Dr. Cloyd Fitzgerald due to kidney function.  Alarm signs reviewed.      Relevant Medications   atorvastatin (LIPITOR) 20 MG tablet   BP (high blood pressure)    Controlled. Continue current regimen. Will recheck renal function today.       Relevant Medications   atorvastatin (LIPITOR) 20 MG tablet     Respiratory   Asthma     Endocrine   Uncontrolled diabetes mellitus ([place]) - Primary    A1c down to 7.9%- mild improvement. Ideally A1c wil continue to improve with daily use of  Bydureon. Continue with Lantus 18 units per day. Pt needs diabetic eye exam- he will call [facility] associates to schedule.  Foot exam UTD. Urine microalbumin UTD. Recheck 3 mos.       Relevant Medications   atorvastatin (LIPITOR) 20 MG tablet   Other Relevant Orders   POCT HgB A1C (Completed)     Genitourinary   Chronic kidney disease (CKD), stage III (moderate)    Recent nephrology appt was moved to October per the patient. Pt reports he has been taking  NSAIDs occasionally at the direction of cardiology. Will recheck kidney function today.  Based on last serum Cr- CrCl is 63.46m/min using Cockcroft Gault using body weight adjustment for obesity. Based on acutal body weight it is estimated to be 99.19mmin Okay to continue bydureon, lisinopril at this time.       Relevant Orders   BASIC METABOLIC PANEL WITH GFR     Other   Morbid obesity ([place]   Encouraged continued efforts for weight loss and healthy lifestyle.       Elevated CO2 level    Probable obesity hypoventilation syndrome. Requesting pulmonology consult at this time. Pt also needs sleep study.       Relevant Orders   Ambulatory referral to Pulmonology   HLD (hyperlipidemia)    Start statin today. Recheck lipids and hepatic function in 3 mos.       Relevant Medications   atorvastatin (LIPITOR) 20 MG tablet   Excessive daytime sleepiness    Probable sleep apnea. Pt has been referred for sleep study many times but refuses to do in lab sleep study. Have discussed the need to get a sleep study given his co-morbid heart failure. Pt is amenable to a pulmonology consult for breathing issues and to set up a possible home sleep study in the office.       Relevant Orders   Ambulatory referral to Pulmonology    Other Visit Diagnoses    Elevated alkaline phosphatase level       Relevant Orders   Hepatic function panel   Snoring       Relevant Orders   Ambulatory referral to Pulmonology      Meds ordered this  encounter  Medications  . atorvastatin (LIPITOR) 20 MG tablet    Sig: Take 1 tablet (20 mg total) by mouth daily.    Dispense:  30 tablet    Refill:  11    Order Specific Question:   Supervising Provider    Answer:   HAArlis [phone]    Follow up plan: Return in about 3 months (around 02/08/2016), or if symptoms worsen or fail to improve, for Diabetes. .[name] Kitchen

## 2015-11-08 NOTE — Assessment & Plan Note (Signed)
Followed at [facility]. Weight is up 4lbs from last visit. Encouraged pt to weigh self daily in the morning and follow cardiology instructions regarding to contact physician. Take lasix only as prescribed by cardiology- do not take extra lasix unless instructed by Dr. Cloyd Stagers due to kidney function.  Alarm signs reviewed.

## 2015-11-08 NOTE — Assessment & Plan Note (Signed)
Probable sleep apnea. Pt has been referred for sleep study many times but refuses to do in lab sleep study. Have discussed the need to get a sleep study given his co-morbid heart failure. Pt is amenable to a pulmonology consult for breathing issues and to set up a possible home sleep study in the office.

## 2015-11-08 NOTE — Patient Instructions (Signed)
Please double check with the bydureon- we are working with medicaid to get it approved. Start taking once weekly.   Please keep appt to see nephrology to follow-up on your kidneys.   We will make you an appt to see pulmonology to discuss the sleep study.

## 2015-11-08 NOTE — Assessment & Plan Note (Signed)
Start statin today. Recheck lipids and hepatic function in 3 mos.

## 2015-11-08 NOTE — Assessment & Plan Note (Signed)
Controlled. Continue current regimen. Will recheck renal function today.

## 2015-11-09 ENCOUNTER — Other Ambulatory Visit: Payer: Self-pay | Admitting: Family Medicine

## 2015-11-12 ENCOUNTER — Other Ambulatory Visit: Payer: Self-pay | Admitting: Family Medicine

## 2015-11-19 ENCOUNTER — Ambulatory Visit: Payer: Medicaid Other | Admitting: Internal Medicine

## 2015-11-27 ENCOUNTER — Ambulatory Visit: Payer: Medicaid Other | Admitting: Gastroenterology

## 2015-11-28 ENCOUNTER — Telehealth: Payer: Self-pay | Admitting: Family Medicine

## 2015-11-28 NOTE — Telephone Encounter (Signed)
Pt was seen by Amy in past was Rx trulicity but insurance didn't approve it so Amy changed to bydureon where he have to mix 2 different medication invert 80 times and if it doesn't mix proper then it can be clumsy so he doesn't feel comfortable taking it he wants something different.

## 2015-11-29 NOTE — Telephone Encounter (Signed)
Called patient back to discuss concerns with Bydureon Pen, he describes that it was demonstrated to him initially by pharmacist and he describes with each twist inverting the pen 10 times, and states that after all 8 twists it takes 80 inverts and he does not feel comfortable with this and was told it may clog in the pen and not inject properly. I advised him that I was not familiar with that technique, and typically it should be twist until the marking on the pen, then tap vigorously to shake/mix it for 10 seconds, and then finish twisting and then inject. I recommend that he return to pharmacist for review of this or he can bring pen into office, this is difficult to explain over the phone. He states that he is not as concerned about it, and is interested in a different medicine, will discuss again in future at follow-up. I stated that he had a PA approved for this one and it is an excellent DM medication, limited alternatives, we could consider Victoza in future, again may need to get approved.  [name], [facility] 11/29/2015, 4:46 PM

## 2015-12-04 ENCOUNTER — Ambulatory Visit: Payer: Medicaid Other | Admitting: Internal Medicine

## 2015-12-25 ENCOUNTER — Ambulatory Visit: Payer: Medicaid Other | Admitting: Gastroenterology

## 2016-01-24 ENCOUNTER — Ambulatory Visit (INDEPENDENT_AMBULATORY_CARE_PROVIDER_SITE_OTHER): Payer: Medicaid Other | Admitting: Podiatry

## 2016-01-24 ENCOUNTER — Encounter: Payer: Self-pay | Admitting: Podiatry

## 2016-01-24 DIAGNOSIS — E1159 Type 2 diabetes mellitus with other circulatory complications: Secondary | ICD-10-CM

## 2016-01-24 DIAGNOSIS — I8393 Asymptomatic varicose veins of bilateral lower extremities: Secondary | ICD-10-CM

## 2016-01-24 DIAGNOSIS — M150 Primary generalized (osteo)arthritis: Secondary | ICD-10-CM

## 2016-01-24 DIAGNOSIS — M159 Polyosteoarthritis, unspecified: Secondary | ICD-10-CM | POA: Insufficient documentation

## 2016-01-24 DIAGNOSIS — R600 Localized edema: Secondary | ICD-10-CM

## 2016-01-24 NOTE — Progress Notes (Signed)
Subjective:  Patient presents today for routine diabetic foot exam of the lower extremities. Patient presents as a new patient. He states that he has been diagnosed with diabetes for several years now. Patient also states that he is currently seeing and being managed by a lower extremity vascular physician. Patient presents today for routine evaluation    Objective/Physical Exam General: The patient is alert and oriented x3 in no acute distress.  Dermatology: Skin is warm, dry  bilateral lower extremities. Negative for open lesions or macerations. Skin to the bilateral lower extremity is extremely tight and frail secondary to venous congestion and edema  Vascular: Palpable pedal pulses bilaterally. Extensive amount of edema noted to the bilateral lower extremities with varicosities of the bilateral lower extremities. Hemosiderin discoloration also noted to the bilateral ankles and legs.  Neurological: Epicritic and protective threshold grossly intact bilaterally.   Musculoskeletal Exam: Range of motion within normal limits to all pedal and ankle joints bilateral. Muscle strength 5/5 in all groups bilateral.   Assessment: #1 diabetes mellitus #2 bilateral lower extremity edema #3 varicosities bilateral lower extremities   Plan of Care:  #1 Patient was evaluated. #2 today comprehensive diabetic lower extremity evaluation was performed. #3 prescription for compression stockings of to the level of the knee 30-40 mmHg #4 recommend follow-up with vascular physician  #5 return to clinic annually   Edrick Kins, DPM [facility]  Dr. Edrick Kins, [place]                                        [place], [place] [address]                Office [phone]  Fax [phone]

## 2016-02-14 ENCOUNTER — Ambulatory Visit: Payer: Medicaid Other | Admitting: Family Medicine

## 2016-02-19 ENCOUNTER — Encounter: Payer: Self-pay | Admitting: Family Medicine

## 2016-02-19 ENCOUNTER — Ambulatory Visit (INDEPENDENT_AMBULATORY_CARE_PROVIDER_SITE_OTHER): Payer: Medicaid Other | Admitting: Family Medicine

## 2016-02-19 VITALS — BP 133/86 | HR 79 | Temp 98.7°F | Resp 16 | Ht 69.0 in | Wt >= 6400 oz

## 2016-02-19 DIAGNOSIS — E1165 Type 2 diabetes mellitus with hyperglycemia: Secondary | ICD-10-CM

## 2016-02-19 DIAGNOSIS — N183 Chronic kidney disease, stage 3 (moderate): Secondary | ICD-10-CM | POA: Diagnosis not present

## 2016-02-19 DIAGNOSIS — E1122 Type 2 diabetes mellitus with diabetic chronic kidney disease: Secondary | ICD-10-CM | POA: Diagnosis not present

## 2016-02-19 DIAGNOSIS — J984 Other disorders of lung: Secondary | ICD-10-CM | POA: Diagnosis not present

## 2016-02-19 DIAGNOSIS — Z794 Long term (current) use of insulin: Secondary | ICD-10-CM

## 2016-02-19 DIAGNOSIS — IMO0002 Reserved for concepts with insufficient information to code with codable children: Secondary | ICD-10-CM

## 2016-02-19 LAB — POCT GLYCOSYLATED HEMOGLOBIN (HGB A1C): HEMOGLOBIN A1C: 11.7

## 2016-02-19 MED ORDER — EXENATIDE ER 2 MG/0.85ML ~~LOC~~ AUIJ: 2.0000 mg | AUTO-INJECTOR | SUBCUTANEOUS | 11 refills | Status: DC

## 2016-02-19 NOTE — Patient Instructions (Addendum)
Thank you for coming in to clinic today.  1. Start new [employer], new pen as demonstrated - once weekly, pick your date and keep on the schedule - Starting this afternoon around 1200, take Lantus 20 units, then starting tomorrow get on a regimen with Lantus 20 units daily around 8:30 to 9:00am, every morning check fasting blood sugar (before food or drink) and if number >150, then increase Lantus by 1 unit every 3 days, until you get to about 35 STOP before next visit  Recommend to follow-up with Eye Doctor for DM eye check  Write down blood sugars, check twice a day morning fasting and once in afternoon 2 hours after lunch OR 2 hours after dinner BRING RECORD TO NEXT VISIT  Recommend some mild walking exercise  Improve regular diet, try to eat 3 meals a day even if small meals, regular schedule is better to control your sugar  2. We will refer you to Pulmnology for oxygen testing to determine if you need O2  [facility] [address], [place], [place] Phone: [phone]   Please schedule a follow-up appointment with Dr. Parks Ranger in 6 weeks for Diabetes Type 2, then 3 months total from today for A1c DM visit  If you have any other questions or concerns, please feel free to call the clinic or send a message through [place]. You may also schedule an earlier appointment if necessary.  [name], DO [place]

## 2016-02-19 NOTE — Progress Notes (Addendum)
Subjective:    Patient ID: [name], male    DOB: January 22, 1966, 51 y.o.   MRN: [id number]  [name] is a 51 y.o. male presenting on 02/19/2016 for Diabetes  HPI   CHRONIC DM, Type 2: Reports chronic problem with controlling blood sugar. CBGs: Avg >150 (does not have CBG log or meter), Low 60-80, High >300. Checks CBGs infrequently was checking 1x not every day. Meds: Lantus 18u daily at 5pm. Bydureon 2mg  weekly (started in 10/2015, previously on Trulicity but then insurance coverage change to preferred Bydureon), he admits difficulty with properly operating the Bydureon pen. - Note in past taken off Metformin due to contraindication with CKD Cr >2 Currently on ACEi Lifestyle: Diet (not always following DM diet, erratic diet will skip several meals) / Exercise (limited exercise tolerance due to obesity, limited mobility, and has dyspnea symptoms) - Followed by Podiatry (Dr Amalia Hailey, [place]), last visit DM foot exam 01/24/16 - He has not scheduled Ophthalmology apt yet - Admits hypoglycemia x 1 with 60, infrequent Denies polyuria, visual changes, numbness or tingling.  Chronic Systolic CHF - Followed by [facility], last visit 10/18/15 - Today reports some intermittent leg cramping, now taking potassium supplement with lasix 120mg  BID. He tries to stay hydrated follow liquid intake limits. - Admits weight gain recently, some edema but this seems to be mostly unchanged, now on compression stockings with some improvement  History of Restrictive Lung Disease, Chronic - He has worn supplemental O2 in past and has used this at various physician offices with improvement in his dyspnea and improve breathing. He would like referral to Pulmonology for testing for oxygen and follow-up his breathing. - Does admit to some hypersensitivity with breathing due to allergy to [place] and certain colonges  Social History  Substance Use Topics  . Smoking status: Never  Smoker  . Smokeless tobacco: Never Used  . Alcohol use Yes     Comment: a glass of wine and a couple beers a day    Review of Systems Per HPI unless specifically indicated above     Objective:    BP 133/86   Pulse 79   Temp 98.7 F (37.1 C) (Oral)   Resp 16   Ht 5\' 9"  (1.753 m)   Wt (!) 413 lb 9.6 oz (187.6 kg)   SpO2 98%   BMI 61.08 kg/m   Wt Readings from Last 3 Encounters:  02/19/16 (!) 413 lb 9.6 oz (187.6 kg)  11/08/15 (!) 383 lb (173.7 kg)  10/10/15 (!) 379 lb 9.6 oz (172.2 kg)    Physical Exam  Constitutional: He is oriented to person, place, and time. He appears well-developed and well-nourished. No distress.  Chronically ill but currently well, comfortable, cooperative, obese  HENT:  Head: Normocephalic and atraumatic.  Mouth/Throat: Oropharynx is clear and moist.  Using nasal cannula portable oxygen tank  Cardiovascular: Normal rate, regular rhythm, normal heart sounds and intact distal pulses.   No murmur heard. Pulmonary/Chest: Effort normal. No respiratory distress. He has no wheezes. He has no rales.  Distant breath sounds due to large body habitus, speaks full sentences. No focal abnormal breath sounds.  Musculoskeletal: He exhibits edema (Bilateral lower extremity +2 pitting firm edema below knees into ankle/feet).  Neurological: He is alert and oriented to person, place, and time.  Skin: Skin is warm and dry. No rash noted. He is not diaphoretic. No erythema.  Psychiatric: His behavior is normal.  Nursing note and vitals  reviewed.  Results for orders placed or performed in visit on 02/19/16  POCT HgB A1C  Result Value Ref Range   Hemoglobin A1C 11.7       Assessment & Plan:   Problem List Items Addressed This Visit    Type 2 diabetes mellitus, uncontrolled, with renal complications ([place]) - Primary    Significant worsening with A1c up to 11.7 (from 7.9), poor control did not titrate up Lantus as anticipated, and also some problems with adherence  to Bydureon due to difficulty with operating pen and appropriate use. - Complication with CKD  Plan: 1. Start new [employer], same dosing 2mg  weekly, sent #4 pens for 1 month with +11 refills to pharmacy. Demonstrated pen in office today, patient comfortable with this new improved design and easy use. Bydureon was preferred option by ins previously. - Limited other options due to CKD, not candidate for SGLT2 2. Titrate up Lantus, increase to 20u daily, recommend switch to AM dosing 9-10am same time every day, increase by 1u every 3 days if AM fasting CBG >150, max 35 then notify office (note his lantus is likely more effective and lasts longer in setting of CKD) 3. Advised he needs to check CBG more regularly and bring written log and meter to office, so I can further adjust his medications, given I have very limited data to work with today 4. Follow-up 6 weeks review CBG logs check progress, then 6 more weeks for DM A1c      Relevant Medications   aspirin 81 MG chewable tablet   Exenatide ER (BYDUREON BCISE) 2 MG/0.85ML AUIJ   Other Relevant Orders   POCT HgB A1C (Completed)   Chronic restrictive lung disease    Chart review with chronic problem, likely multifactorial given morbid obesity and possibly other underlying etiology, also in setting of chronic systolic CHF.  Plan: 1. Referral to [facility] for evaluation / management requesting supplemental oxygen certification testing 6 min walk test, and likely needs update PFTs to re-evaluate restrictive disease 2. Follow-up as needed      Relevant Orders   Ambulatory referral to Pulmonology      Meds ordered this encounter  Medications  . aspirin 81 MG chewable tablet    Sig: Chew 81 mg by mouth daily.  . beclomethasone (QVAR) 80 MCG/ACT inhaler    Sig: Inhale into the lungs.  . Exenatide ER (BYDUREON BCISE) 2 MG/0.85ML AUIJ    Sig: Inject 2 mg into the skin once a week.    Dispense:  4 pen    Refill:  11       Follow up plan: Return in about 6 weeks (around 04/01/2016) for diabetes.  [name], [facility] 02/21/2016, 12:51 PM

## 2016-02-20 NOTE — Assessment & Plan Note (Signed)
Chart review with chronic problem, likely multifactorial given morbid obesity and possibly other underlying etiology, also in setting of chronic systolic CHF.  Plan: 1. Referral to [facility] for evaluation / management requesting supplemental oxygen certification testing 6 min walk test, and likely needs update PFTs to re-evaluate restrictive disease 2. Follow-up as needed

## 2016-02-20 NOTE — Assessment & Plan Note (Addendum)
Significant worsening with A1c up to 11.7 (from 7.9), poor control did not titrate up Lantus as anticipated, and also some problems with adherence to Bydureon due to difficulty with operating pen and appropriate use. - Complication with CKD  Plan: 1. Start new [employer], same dosing 2mg  weekly, sent #4 pens for 1 month with +11 refills to pharmacy. Demonstrated pen in office today, patient comfortable with this new improved design and easy use. Bydureon was preferred option by ins previously. - Limited other options due to CKD, not candidate for SGLT2 2. Titrate up Lantus, increase to 20u daily, recommend switch to AM dosing 9-10am same time every day, increase by 1u every 3 days if AM fasting CBG >150, max 35 then notify office (note his lantus is likely more effective and lasts longer in setting of CKD) 3. Advised he needs to check CBG more regularly and bring written log and meter to office, so I can further adjust his medications, given I have very limited data to work with today 4. Follow-up 6 weeks review CBG logs check progress, then 6 more weeks for DM A1c

## 2016-02-21 ENCOUNTER — Encounter: Payer: Self-pay | Admitting: *Deleted

## 2016-03-02 ENCOUNTER — Telehealth: Payer: Self-pay | Admitting: Family Medicine

## 2016-03-02 DIAGNOSIS — IMO0002 Reserved for concepts with insufficient information to code with codable children: Secondary | ICD-10-CM

## 2016-03-02 DIAGNOSIS — E1165 Type 2 diabetes mellitus with hyperglycemia: Principal | ICD-10-CM

## 2016-03-02 DIAGNOSIS — E1122 Type 2 diabetes mellitus with diabetic chronic kidney disease: Secondary | ICD-10-CM

## 2016-03-02 DIAGNOSIS — Z794 Long term (current) use of insulin: Principal | ICD-10-CM

## 2016-03-02 DIAGNOSIS — N183 Chronic kidney disease, stage 3 (moderate): Principal | ICD-10-CM

## 2016-03-02 MED ORDER — LIRAGLUTIDE 18 MG/3ML ~~LOC~~ SOPN: PEN_INJECTOR | SUBCUTANEOUS | 2 refills | Status: DC

## 2016-03-02 NOTE — Telephone Encounter (Addendum)
Last visit 02/19/16, with significantly elevated A1c, see office note, he had difficulty with original Bydureon Pen, and then we sent new rx Bydureon BCise, however we were not able to get this approved by Medicaid, despite PA for Bydureon regular pen several months ago.  Received notice of this denial of request today 03/02/16.  Attempted to call patient today, did not reach, left voice mail, that we can resume the original Bydureon pen, and he can come in for demonstration on how to use it, otherwise only other option would be Victoza DAILY injection, and we would titrate up the dose. Since we cannot get Bydureon BCise covered by insurance, and Trulicity is no longer preferred either.  Patient can call back with questions. Will hold off on sending Bydureon pen vs Victoza at this time until I hear back from patient. ---------------------------------------------------  Patient called back, he is frustrated with Bydureon and does not want to resume the old Bydureon pen. He is also asking about oral option, however given significant CKD, this is contraindicated and he cannot take SGLT2 options, advised him of this.  Now only option is Victoza. He is not looking forward to doing injection one daily instead of once weekly, and I have concerns for his adherence with this regimen, however he will give it a try. Also states he cannot find his glucometer, and does not know which brand it was, he has stopped titrating up Lantus at 20u daily due to not checking CBG.  I advised patient that we will start Victoza 0.6mg  subQ daily injection for now, he can continue this for 2 weeks and then increase to 1.2mg  daily as tolerated without nausea, vomiting. Continue this dose until next office visit. Also advised him to either purchase OTC [employer] Glucometer and test strips, OR he needs to contact Medicaid to get preferred glucometer name and info, and call us back with this and I will order it.  [name], [facility] 03/02/2016, 12:58 PM

## 2016-03-03 ENCOUNTER — Other Ambulatory Visit: Payer: Self-pay | Admitting: Family Medicine

## 2016-03-03 DIAGNOSIS — I10 Essential (primary) hypertension: Secondary | ICD-10-CM

## 2016-03-03 MED ORDER — CARVEDILOL 25 MG PO TABS: 50.0000 mg | ORAL_TABLET | Freq: Two times a day (BID) | ORAL | 5 refills | Status: DC

## 2016-03-04 ENCOUNTER — Telehealth: Payer: Self-pay | Admitting: *Deleted

## 2016-03-04 NOTE — Telephone Encounter (Signed)
[facility] pharmacy called for verbal refill on patient's Carvedilol 25 mg. Though it was sent to [place] patient has tendency to switch pharmacies. Verbal given as directed to [employer].

## 2016-03-11 ENCOUNTER — Other Ambulatory Visit: Payer: Self-pay | Admitting: Family Medicine

## 2016-03-11 MED ORDER — INSULIN PEN NEEDLE 31G X 5 MM MISC: 1.0000 | Freq: Every day | 12 refills | Status: DC

## 2016-03-11 NOTE — Telephone Encounter (Signed)
PA for Victoza approved.  Auth # [id number] Valid 03/03/16--02/26/2017

## 2016-03-31 ENCOUNTER — Institutional Professional Consult (permissible substitution): Payer: Medicaid Other | Admitting: Internal Medicine

## 2016-03-31 ENCOUNTER — Encounter: Payer: Self-pay | Admitting: *Deleted

## 2016-04-06 ENCOUNTER — Ambulatory Visit: Payer: Medicaid Other | Admitting: Family Medicine

## 2016-04-15 ENCOUNTER — Telehealth: Payer: Self-pay

## 2016-04-15 NOTE — Telephone Encounter (Signed)
Received fax from disability determination services requesting medical records. Placed request in file folder at front desk.

## 2016-04-21 ENCOUNTER — Ambulatory Visit: Payer: Medicaid Other | Admitting: Family Medicine

## 2016-04-22 ENCOUNTER — Encounter: Payer: Self-pay | Admitting: *Deleted

## 2016-04-22 ENCOUNTER — Telehealth: Payer: Self-pay | Admitting: Family Medicine

## 2016-04-22 NOTE — Telephone Encounter (Signed)
Reviewed recent chart Care Everywhere from [facility] Heart Failure clinic, recently managed for Acute on Chronic Systolic CHF, in setting of progression CKD from III-IV over past several months with elevated Cr to >3.5, and concerns with Hyperkalemia, he is being managed at IV diuresis clinic, also oral bumex, concerns on NSAID use, also he had been still taking oral K supplement and bananas to keep K up, however he was advised now HyperK to stop these.  Cardiology has concerns about his med adherence, he was contacted today regarding his office visit and lab orders, and they were advising repeat lab draw with chemistry to check K, they were planning to manage these results with adjust Lisinopril and start Kayexalate if needed.  I attempted to call [facility] back to review this plan, left voice message at approx 1:45pm on 04/22/16. Anticipate that given significant abnormal results, will check lab as STAT tomorrow 3/29 and contact [facility] with results.  [name], [facility] 04/22/2016, 1:52 PM

## 2016-04-22 NOTE — Telephone Encounter (Signed)
Charlene at [facility] said pt has appt tomorrow and they put lab orders in epic for pt to have labs done here prior to appt.  He will be seeing Dr. Cloyd Stagers and they want to check his potassium.  Her call back number is [phone]

## 2016-04-23 ENCOUNTER — Ambulatory Visit (INDEPENDENT_AMBULATORY_CARE_PROVIDER_SITE_OTHER): Payer: BLUE CROSS/BLUE SHIELD | Admitting: Family Medicine

## 2016-04-23 ENCOUNTER — Encounter: Payer: Self-pay | Admitting: Family Medicine

## 2016-04-23 ENCOUNTER — Encounter: Payer: Self-pay | Admitting: *Deleted

## 2016-04-23 VITALS — BP 109/69 | HR 80 | Temp 98.6°F | Resp 16 | Ht 69.0 in | Wt 390.0 lb

## 2016-04-23 DIAGNOSIS — N184 Chronic kidney disease, stage 4 (severe): Secondary | ICD-10-CM | POA: Diagnosis not present

## 2016-04-23 DIAGNOSIS — E1122 Type 2 diabetes mellitus with diabetic chronic kidney disease: Secondary | ICD-10-CM | POA: Diagnosis not present

## 2016-04-23 DIAGNOSIS — E1165 Type 2 diabetes mellitus with hyperglycemia: Secondary | ICD-10-CM

## 2016-04-23 DIAGNOSIS — I5022 Chronic systolic (congestive) heart failure: Secondary | ICD-10-CM | POA: Diagnosis not present

## 2016-04-23 DIAGNOSIS — IMO0002 Reserved for concepts with insufficient information to code with codable children: Secondary | ICD-10-CM

## 2016-04-23 DIAGNOSIS — E875 Hyperkalemia: Secondary | ICD-10-CM

## 2016-04-23 DIAGNOSIS — M1712 Unilateral primary osteoarthritis, left knee: Secondary | ICD-10-CM | POA: Diagnosis not present

## 2016-04-23 DIAGNOSIS — Z794 Long term (current) use of insulin: Secondary | ICD-10-CM

## 2016-04-23 LAB — BASIC METABOLIC PANEL WITH GFR
BUN: 64 mg/dL — ABNORMAL HIGH (ref 7–25)
CHLORIDE: 91 mmol/L — AB (ref 98–110)
CO2: 41 mmol/L — AB (ref 20–31)
Calcium: 8.7 mg/dL (ref 8.6–10.3)
Creat: 3.24 mg/dL — ABNORMAL HIGH (ref 0.60–1.35)
GFR, EST NON AFRICAN AMERICAN: 21 mL/min — AB (ref >=60)
GFR, Est African American: 25 mL/min — ABNORMAL LOW (ref >=60)
Glucose, Bld: 161 mg/dL — ABNORMAL HIGH (ref 65–99)
POTASSIUM: 4.7 mmol/L (ref 3.5–5.3)
SODIUM: 139 mmol/L (ref 135–146)

## 2016-04-23 MED ORDER — EXENATIDE ER 2 MG/0.85ML ~~LOC~~ AUIJ: 2.0000 mg | AUTO-INJECTOR | SUBCUTANEOUS | 11 refills | Status: DC

## 2016-04-23 NOTE — Progress Notes (Signed)
Subjective:    Patient ID: [name], male    DOB: Jan 03, 1966, 51 y.o.   MRN: [id number]  [name] is a 51 y.o. male presenting on 04/23/2016 for Diabetes  HPI   CHRONIC DM, Type 2: Reports CBGs have improved since last visit. Now 80-120s, occasional hypoglycemia < 80 with symptoms but he is aware of this and promptly takes in PO orange juice or other with good response, occurring 1x every few weeks. Checks CBG 1-2x daily, does not have log today. Meds: Lantus 20u daily at 5pm - he did not receive Bydureon BCise previously due to Medicaid ins and lack of coverage, only Bydureon old pen covered and he could not use this pen, had difficulty operating it and did not appropriately administer medicine. He was given new rx for Victoza, but today reports he was unaware of this and never started it. Currently on ACEi Lifestyle: Diet (still not always following DM diet) / Exercise (limited exercise tolerance due to obesity, limited mobility, and has dyspnea symptoms) - Complication with worsening CKD - He has not scheduled Ophthalmology apt yet, now has ins coverage - Admits hypoglycemia Denies polyuria, visual changes, numbness or tingling.  Chronic Systolic CHF - Followed by [facility] Heart Failure clinic, last seen 04/09/16, has been managed acutely over past 1 month for acute on chronic heart failure exacerbation, and also receiving IV diuresis treatment, other dose adjust to oral diuretics, no longer lasix. He had responded well initially to diuresis with some weight / fluid loss, improved breathing by report, recent concerns with elevated worsening Cr in setting of CKD and hyperkalemia, recently has been >6 - Yesterday 3/28 he was advised to get labs done again, and I spoke with [facility] Cardiology nurse, we agree to check BMET today to get results and forward to Cardiology, he is expecting blood work today - No new concerns today regarding fluid status or heart - Next apt with  [facility] Cardiology next week - Denies worsening chest pain, dyspnea, edema  Left Knee Pain, Osteoarthritis - He reports chronic L knee pain compared to Right, gradual worsening over time, states worst over past few months with inc swelling and fluid gain. He tries heating pad with some temporary relief, some ice. - Not taking Tylenol because it "doesn't work", unsure what dose he was taking. In the past Advil helped it most, now he can't take these meds - Not wearing knee sleeve or brace - History of other arthritis - Denies any fall or injury, redness  History of Restrictive Lung Disease, Chronic - Previously on supplemental O2 only in offices with symptomatic relief, he has never been tested or confirmed for need O2, there were concerns of RLD in past. He was referred to Pulmonology by previous PCP never established. Last visit 01/2016 due to his requesting oxygen, we referred him to [facility], but he had several cancels and no shows, and they never saw him. - Today he reports breathing better, due to weight loss and fluid weight down. He is not sure if he wants to go see pulmonology, but overall understands that he should have some formal testing first  Social History  Substance Use Topics  . Smoking status: Never Smoker  . Smokeless tobacco: Never Used  . Alcohol use Yes     Comment: a glass of wine and a couple beers a day    Review of Systems Per HPI unless specifically indicated above     Objective:  BP 109/69   Pulse 80   Temp 98.6 F (37 C) (Oral)   Resp 16   Ht 5\' 9"  (1.753 m)   Wt (!) 390 lb (176.9 kg)   SpO2 92%   BMI 57.59 kg/m   Wt Readings from Last 3 Encounters:  04/23/16 (!) 390 lb (176.9 kg)  02/19/16 (!) 413 lb 9.6 oz (187.6 kg)  11/08/15 (!) 383 lb (173.7 kg)    Physical Exam  Constitutional: He is oriented to person, place, and time. He appears well-developed and well-nourished. No distress.  Chronically ill but currently well, comfortable,  cooperative, obese  HENT:  Head: Normocephalic and atraumatic.  Mouth/Throat: Oropharynx is clear and moist.  Using nasal cannula portable oxygen tank  Eyes: Conjunctivae are normal.  Cardiovascular: Normal rate, regular rhythm, normal heart sounds and intact distal pulses.   No murmur heard. Pulmonary/Chest: Effort normal. No respiratory distress. He has no wheezes. He has no rales.  Distant breath sounds due to large body habitus, speaks full sentences. No focal abnormal breath sounds.  Musculoskeletal: He exhibits edema (Bilateral lower extremity +2 pitting firm edema below knees into ankle/feet).  Neurological: He is alert and oriented to person, place, and time.  Appears drowsy today  Skin: Skin is warm and dry. No rash noted. He is not diaphoretic. No erythema.  Psychiatric: He has a normal mood and affect. His behavior is normal.  Nursing note and vitals reviewed.  Results for orders placed or performed in visit on 77/41/28  BASIC METABOLIC PANEL WITH GFR  Result Value Ref Range   Sodium 139 135 - 146 mmol/L   Potassium 4.7 3.5 - 5.3 mmol/L   Chloride 91 (L) 98 - 110 mmol/L   CO2 41 (H) 20 - 31 mmol/L   Glucose, Bld 161 (H) 65 - 99 mg/dL   BUN 64 (H) 7 - 25 mg/dL   Creat 3.24 (H) 0.60 - 1.35 mg/dL   Calcium 8.7 8.6 - 10.3 mg/dL   GFR, Est African American 25 (L) >=60 mL/min   GFR, Est Non African American 21 (L) >=60 mL/min      Assessment & Plan:   Problem List Items Addressed This Visit    Type 2 diabetes mellitus, uncontrolled, with renal complications (HCC) - Primary    Interval follow-up today, limited CBG readings available, but reported to be improving avg 80-120s, has some hypoglycemia, resolves with PO intake easily. - Last A1c 11.7 (from 7.9), not due for A1c yet - Failed: Metformin (contraindicated CKD), Trulicity (not covered ins), Bydureon (unable to appropriately operate and use pen), Victoza. Contraindicated SGLT due to renal failure - Complication with  CKD, worsening stage III-IV  Plan: 1. Now with new [employer], again attempt to re-try GLP1, start new Bydureon BCise pen, same dosing 2mg  weekly, sent #4 pens for 1 month with +11 refills to pharmacy. (previously demo'd pen for patient) - given patient a manufacturer coupon card for minimal copay, after processed first by ins PA 2. Caution with Lantus in future if start new GLP1, he is on Lantus 20u daily, likely controlling his sugars better due to remaining in body longer with worsening CKD - advised may need to half dose to Lantus 10 in future - caution hypoglycemia 3. Advised he needs to check CBG more regularly and bring written log and meter to office, so I can further adjust his medications, given I have very limited data to work with today 4. Follow-up 1-2 months for DM A1c  Relevant Medications   Exenatide ER (BYDUREON BCISE) 2 MG/0.85ML AUIJ   Osteoarthritis    Subacute on chronic L medial Knee pain without known injury or trauma. Suspected likely due to underlying osteoarthritis / DJD also primarily with morbid obesity and deconditioning, inc stress on knees - Able to bear weight, no knee instability, mechanical locking - No prior history of knee surgery, arthroscopy - Inadequate conservative therapy - contraindicated NSAIDs due to CKD  Plan: 1. Start Tylenol 500-1000mg  per dose TID PRN - future consider muscle relaxant vs topical NSAID diclofenac 2. RICE therapy (rest, ice, compression, elevation) for swelling, activity modification - recommend knee brace for support 3. Future consider Knee x-rays 4. Follow-up as needed, consider steroid injection as well      Morbid obesity ([place])    Weight down about 20 lbs in 2 months since 01/2016, however suspect this is largely fluid weight, with prior edema from CHF, overall he is still up 7-10 lbs since last 10/2015 - Limited exercise due to knee pain and mobility, constellation of symptoms with CHF - Counseling on appropriate  diet to help manage weight loss - Concern limited options for bariatric eval given his significant co-morbidities, uncontrolled DM, CHF, CKD      Relevant Medications   Exenatide ER (BYDUREON BCISE) 2 MG/0.85ML AUIJ   CKD (chronic kidney disease), stage IV ([place])    Followed by Nephrology, also monitored by [facility] with IV diuresis - Check STAT BMET today, stable elevated Cr >3, results faxed to [facility] Cardiology to continue managing diuresis, oral and IV      Relevant Orders   BASIC METABOLIC PANEL WITH GFR (Completed)   Chronic systolic heart failure ([place])    Followed by [facility] Cardiology, recently with acute on chronic systolic CHF, was receiving IV diuresis clinic, advanced management of oral diuresis as well, some improvement, had worsening Cr and hyperK >6 - Checked STAT BMET today as requested per [facility], with normalized K 4.7, persistent elevated Cr, result released to patient, and faxed to [facility] next week as planned with Cardiology      Relevant Medications   bumetanide (BUMEX) 1 MG tablet   metolazone (ZAROXOLYN) 5 MG tablet   Other Relevant Orders   BASIC METABOLIC PANEL WITH GFR (Completed)    Other Visit Diagnoses    Hyperkalemia       Improved on re-check STAT BMET today, previously >6, forwarded results to [facility] for further management with currently planned diuresis   Relevant Orders   BASIC METABOLIC PANEL WITH GFR (Completed)      Meds ordered this encounter  Medications  . bumetanide (BUMEX) 1 MG tablet    Sig: Take 1 mg by mouth.  . metolazone (ZAROXOLYN) 5 MG tablet    Sig: Take 5 mg by mouth.  . Exenatide ER (BYDUREON BCISE) 2 MG/0.85ML AUIJ    Sig: Inject 2 mg into the skin once a week.    Dispense:  4 pen    Refill:  11   Follow up plan: Return in about 6 weeks (around 06/04/2016) for diabetes.  [name], [facility] 04/23/2016, 3:31 PM

## 2016-04-23 NOTE — Assessment & Plan Note (Signed)
Subacute on chronic L medial Knee pain without known injury or trauma. Suspected likely due to underlying osteoarthritis / DJD also primarily with morbid obesity and deconditioning, inc stress on knees - Able to bear weight, no knee instability, mechanical locking - No prior history of knee surgery, arthroscopy - Inadequate conservative therapy - contraindicated NSAIDs due to CKD  Plan: 1. Start Tylenol 500-1000mg  per dose TID PRN - future consider muscle relaxant vs topical NSAID diclofenac 2. RICE therapy (rest, ice, compression, elevation) for swelling, activity modification - recommend knee brace for support 3. Future consider Knee x-rays 4. Follow-up as needed, consider steroid injection as well

## 2016-04-23 NOTE — Assessment & Plan Note (Addendum)
Followed by Nephrology, also monitored by [facility] Cardiology with IV diuresis - Check STAT BMET today, stable elevated Cr >3, results faxed to [facility] to continue managing diuresis, oral and IV

## 2016-04-23 NOTE — Assessment & Plan Note (Signed)
Interval follow-up today, limited CBG readings available, but reported to be improving avg 80-120s, has some hypoglycemia, resolves with PO intake easily. - Last A1c 11.7 (from 7.9), not due for A1c yet - Failed: Metformin (contraindicated CKD), Trulicity (not covered ins), Bydureon (unable to appropriately operate and use pen), Victoza. Contraindicated SGLT due to renal failure - Complication with CKD, worsening stage III-IV  Plan: 1. Now with new [employer], again attempt to re-try GLP1, start new Bydureon BCise pen, same dosing 2mg  weekly, sent #4 pens for 1 month with +11 refills to pharmacy. (previously demo'd pen for patient) - given patient a manufacturer coupon card for minimal copay, after processed first by ins PA 2. Caution with Lantus in future if start new GLP1, he is on Lantus 20u daily, likely controlling his sugars better due to remaining in body longer with worsening CKD - advised may need to half dose to Lantus 10 in future - caution hypoglycemia 3. Advised he needs to check CBG more regularly and bring written log and meter to office, so I can further adjust his medications, given I have very limited data to work with today 4. Follow-up 1-2 months for DM A1c

## 2016-04-23 NOTE — Patient Instructions (Signed)
Thank you for coming in to clinic today.  1. Start new [employer], new pen as shown last time - once weekly, pick your date and keep on the schedule, only covered with new copay card given today  For lantus, you may have to reduce dose down to 15 or 10units daily if this new medicine starts working very well, also with your kidneys the Lantus LASTS LONGER so be careful on higher doses around 20 units  Write down blood sugars, check twice a day morning fasting and once in afternoon 2 hours after lunch OR 2 hours after dinner BRING RECORD TO NEXT VISIT - IF BLOOD SUGAR IS CONSISTENTLY < 80-90, then notify office and we can make a change  Recommend to follow-up with Eye Doctor for DM eye check  Recommend some LOW impact exercise for Knee - prefer swimming or water aerobics  Recommend to start taking Tylenol Extra Strength 500mg  tabs - take 1 to 2 tabs per dose (max 1000mg ) every 6-8 hours for pain (take regularly, don't skip a dose for next 7 days), max 24 hour daily dose is 6 tablets or 3000mg . In the future you can repeat the same everyday Tylenol course for 1-2 weeks at a time.   Try a Knee Support Brace as well  Please call and re-schedule Pulmnology apt for oxygen testing to determine if you need O2  [facility] [address], [place], [place] Phone: [phone]   Please schedule a follow-up appointment with Dr. Parks Ranger in 4-6 weeks for Diabetes A1c  If you have any other questions or concerns, please feel free to call the clinic or send a message through [place]. You may also schedule an earlier appointment if necessary.  [name], DO [place]

## 2016-04-23 NOTE — Assessment & Plan Note (Addendum)
Followed by [facility], recently with acute on chronic systolic CHF, was receiving IV diuresis clinic, advanced management of oral diuresis as well, some improvement, had worsening Cr and hyperK >6 - Checked STAT BMET today as requested per [facility], with normalized K 4.7, persistent elevated Cr, result released to patient, and faxed to [place] next week as planned with Cardiology

## 2016-04-23 NOTE — Assessment & Plan Note (Signed)
Weight down about 20 lbs in 2 months since 01/2016, however suspect this is largely fluid weight, with prior edema from CHF, overall he is still up 7-10 lbs since last 10/2015 - Limited exercise due to knee pain and mobility, constellation of symptoms with CHF - Counseling on appropriate diet to help manage weight loss - Concern limited options for bariatric eval given his significant co-morbidities, uncontrolled DM, CHF, CKD

## 2016-04-29 ENCOUNTER — Other Ambulatory Visit: Payer: Self-pay | Admitting: Family Medicine

## 2016-04-29 DIAGNOSIS — M109 Gout, unspecified: Secondary | ICD-10-CM

## 2016-04-29 MED ORDER — ALLOPURINOL 100 MG PO TABS: 100.0000 mg | ORAL_TABLET | Freq: Every day | ORAL | 6 refills | Status: DC

## 2016-05-05 ENCOUNTER — Telehealth: Payer: Self-pay

## 2016-05-05 NOTE — Telephone Encounter (Signed)
Pt called office about his Bydureon  is covered but he still needs to pay hi co pay he wants to go back to Trulicity please suggest ?

## 2016-05-05 NOTE — Telephone Encounter (Addendum)
Now that he has Psychologist, sport and exercise, he was given the manufacturer copay card last visit, and this should reduce his copay to very minimal now that it is approved. I am a little confused by this.  [place]. Spoke with pharmacist Page, she reviewed his case, and she states that he has NOT submitted the manufacturer's coupon card yet. She states that insurance has approved Bydureon BCise but copay for 1 month supply is still $200. It is still non-formulary which we were aware of, but he has failed Victoza, and Bydureon original pen, and was on Trulicity temporarily in past until advised to switch based on insurance. ---------------------------------------------------------------  Could you please call patient to advise him to go back to pharmacy with the [employer] manufacturer coupon card and see what his copay would be? It should successfully work for up to 1 year with minimal copay. If this is the case, and it is affordable, then this is preferred route.  Otherwise, if it is NOT affordable (he should let us know cost), then we will need to discontinue rx and re-try the Trulicity, as this may be on the preferred formulary now.  [name], [facility] 05/05/2016, 7:03 PM

## 2016-05-06 NOTE — Telephone Encounter (Signed)
LMTCB

## 2016-05-06 NOTE — Telephone Encounter (Signed)
Pt advised to use coupon card understand well.

## 2016-05-13 ENCOUNTER — Other Ambulatory Visit: Payer: Self-pay | Admitting: Family Medicine

## 2016-05-13 DIAGNOSIS — K219 Gastro-esophageal reflux disease without esophagitis: Secondary | ICD-10-CM

## 2016-05-13 MED ORDER — RANITIDINE HCL 150 MG PO CAPS: 150.0000 mg | ORAL_CAPSULE | Freq: Every evening | ORAL | 3 refills | Status: DC

## 2016-05-18 ENCOUNTER — Institutional Professional Consult (permissible substitution): Payer: Medicaid Other | Admitting: Internal Medicine

## 2016-06-01 ENCOUNTER — Other Ambulatory Visit: Payer: Self-pay | Admitting: Family Medicine

## 2016-06-09 ENCOUNTER — Encounter: Payer: Self-pay | Admitting: Family Medicine

## 2016-06-09 ENCOUNTER — Ambulatory Visit (INDEPENDENT_AMBULATORY_CARE_PROVIDER_SITE_OTHER): Payer: Medicaid Other | Admitting: Family Medicine

## 2016-06-09 ENCOUNTER — Telehealth: Payer: Self-pay | Admitting: Family Medicine

## 2016-06-09 VITALS — BP 104/74 | HR 78 | Temp 98.4°F | Resp 16 | Ht 69.0 in | Wt 378.5 lb

## 2016-06-09 DIAGNOSIS — M10321 Gout due to renal impairment, right elbow: Secondary | ICD-10-CM | POA: Diagnosis not present

## 2016-06-09 DIAGNOSIS — M1A39X0 Chronic gout due to renal impairment, multiple sites, without tophus (tophi): Secondary | ICD-10-CM | POA: Diagnosis not present

## 2016-06-09 DIAGNOSIS — M25521 Pain in right elbow: Secondary | ICD-10-CM

## 2016-06-09 DIAGNOSIS — L03113 Cellulitis of right upper limb: Secondary | ICD-10-CM | POA: Diagnosis not present

## 2016-06-09 NOTE — Patient Instructions (Signed)
Thank you for coming to the clinic today.  I am concerned that your Right Elbow may actually be an Skin Infection or Cellulitis and possibly a deeper abscess or Joint Infection, which is more concerning and could be an emergency. Also this may be Gout as discussed since you have had similar flares before, but this one is not seem to be resolving.  Concern with taking the higher doses of Ibuprofen and Aleve due to your kidneys, since it is not helping any further I would recommend STOPPING these medications. Prefer Tylenol only for pain at this point, up to 1000mg  Tylenol 2-3 times daily.  If this is gout, most likely due to Chronic Kidney Disease, it can be common in renal impairment.  You have been scheduled for URGENT Orthopedics Appointment TODAY Tuesday 5/15 at 11:30am   Dr Jackqulyn Livings (Hand Wrist Elbow specialist)  [facility] Address: [address], [place], [name] [address] Hours:  9AM-5PM Phone: [phone]  If your symptoms significantly worsen after treatment today, you may need to go directly to hospital ED for further evaluation, may need IV antibiotics if you develop fevers/chills, sweats, spreading redness or worsening pain.  -------- Demonstration today on how to use the Bydureon BCise pen, go ahead and start using this and may bring to pharmacy to have additional assistance on how to use it.  Please schedule a follow-up appointment with Dr. Parks Ranger in 3-4 weeks to follow-up Diabetes A1c  If you have any other questions or concerns, please feel free to call the clinic or send a message through [place]. You may also schedule an earlier appointment if necessary.  [name], DO [place]

## 2016-06-09 NOTE — Progress Notes (Signed)
Subjective:    Patient ID: [name], male    DOB: 23-Feb-1965, 51 y.o.   MRN: [id number]  [name] is a 51 y.o. male presenting on 06/09/2016 for Gout (elbow painful onset 2 weeks)   HPI   ACUTE RIGHT ELBOW PAIN AND SWELLING / Possible GOUT vs Cellulitis / CKD-IV  - Last gout flare was about 5 years ago, in past has had flares in R elbow, other elbows, toes, ankle, knee, wrist. States he has never had confirmation of gout crystals by aspiration in past. Previously would take Ibuprofen early on in flare and started Ibuprofen and it would resolve before flare even started. - Recently reports with new onset acute R elbow pain swelling and redness, he was concerned for possible gout flare, unsure trigger (but does have CKD-IV), took Ibuprofen 1000mg  x 1 and Aleve OTC 600mg  few doses, unsure exact dosing regimen, took early on and with worsening swelling. NSAIDs did not resolve it - Now still persistent pain swelling and redness, had a pustule that developed later he tried to "squeeze it" but minimal drainage, seemed to make pain in elbow worse, he is unsure on timing if this started before or after elbow pain - Taking Allopurinol for many years but only restarted it 2 years ago, he thinks dose is 300mg , but rx active in chart is for 100mg  daily, due to CKD and limited renal function - No recent antibiotics, has tolerated Augmentin / Amoxicillin in past, NDKA - Tries to eat diet with lower trigger food for gout, reduced red meat, but does not elaborate on rest of diet - Denies any fevers/chills, sweats, spreading redness, drainage of pus, nausea, vomiting, chest pain, dyspnea  Additionally complicated history with uncontrolled DM2, A1c up to 11 last check, now has Bydureon BCise pen but has not started using due to difficulty with device, asking for demonstration today.  Social History  Substance Use Topics  . Smoking status: Never Smoker  . Smokeless tobacco: Never Used  .  Alcohol use Yes     Comment: a glass of wine and a couple beers a day    Review of Systems Per HPI unless specifically indicated above     Objective:    BP 104/74   Pulse 78   Temp 98.4 F (36.9 C) (Oral)   Resp 16   Ht 5\' 9"  (1.753 m)   Wt (!) 378 lb 8 oz (171.7 kg)   BMI 55.89 kg/m   Wt Readings from Last 3 Encounters:  06/09/16 (!) 378 lb 8 oz (171.7 kg)  04/23/16 (!) 390 lb (176.9 kg)  02/19/16 (!) 413 lb 9.6 oz (187.6 kg)    Physical Exam  Constitutional: He is oriented to person, place, and time. He appears well-developed and well-nourished. No distress.  Chronically ill-appearing, mildly uncomfortable with R arm/elbow, cooperative, morbidly obese  HENT:  Head: Normocephalic and atraumatic.  Mouth/Throat: Oropharynx is clear and moist.  Eyes: Conjunctivae are normal.  Cardiovascular: Normal rate, regular rhythm, normal heart sounds and intact distal pulses.   No murmur heard. Pulmonary/Chest: Effort normal and breath sounds normal.  Musculoskeletal: He exhibits edema (R elbow).  Elbow, Right Inspection / Palpation: Notable area of edema soft tissue and possible joint effusion with erythema, warmth to touch, tender ROM: Still mostly intact flexion / extension of elbow Strength: distally intact Neurovascular: distally intact  Neurological: He is alert and oriented to person, place, and time.  Skin: Skin is warm and dry. No rash  noted. He is not diaphoretic. There is erythema (extending area approx 8-10 cm laterally on side of elbow and some mild erythema over elbow).  Superficial skin slough on back side of R elbow with small point pustule appearance without active draining, mild tender some induration without obvious fluctuance.  Psychiatric: He has a normal mood and affect. His behavior is normal.  Nursing note and vitals reviewed.   Right Elbow     Results for orders placed or performed in visit on 79/39/03  BASIC METABOLIC PANEL WITH GFR  Result Value Ref  Range   Sodium 139 135 - 146 mmol/L   Potassium 4.7 3.5 - 5.3 mmol/L   Chloride 91 (L) 98 - 110 mmol/L   CO2 41 (H) 20 - 31 mmol/L   Glucose, Bld 161 (H) 65 - 99 mg/dL   BUN 64 (H) 7 - 25 mg/dL   Creat 3.24 (H) 0.60 - 1.35 mg/dL   Calcium 8.7 8.6 - 10.3 mg/dL   GFR, Est African American 25 (L) >=60 mL/min   GFR, Est Non African American 21 (L) >=60 mL/min      Assessment & Plan:   Problem List Items Addressed This Visit    Gout    Other Visit Diagnoses    Right elbow pain    -  Primary   Cellulitis of right elbow      Concern with acute elbow pain x 2 weeks with associated erythema, edema, pustule differential concerning for acute gout without resolution vs cellulitis abscess possible R elbow joint infection. Patient is high risk for gout with chronic history no recent flares, but CKD-IV likely contributing to inc gout. Also complicated with uncontrolled DM2. - No recent injury or trauma - No recent antibiotics  Plan: 1. Discussion on concerns with gout vs infection, since timeline is >2 weeks limited treatments available for gout, he also received a steroid shot earlier this week, refractory to NSAIDs, advised him to STOP all NSAIDs due to CKD-IV and not helping, need to switch to Tylenol PRN for relief if possible or other pain medicine 2. Urgent referral to [facility] - called triage and scheduled apt today for patient in [place] [facility] office at 11:30am with Dr Jackqulyn Livings (Wrist/Hand/Elbow specialist), may need aspiration to check for crystals vs possible management of infection, deferred antibiotics or x-ray today from our office since patient to be seen in 2 hours at Ortho, authorized 3 visits, referral placed, records to be faxed to [phone] 3. Advised strict return criteria if worsening and not improving, when to go to hospital ED after ortho if needed       Follow up plan: Return in about 3 weeks (around 06/30/2016) for diabetes.  [name], DO [facility] 06/09/2016, 9:04 AM

## 2016-06-09 NOTE — Telephone Encounter (Signed)
See office note from earlier today 5/15 for R elbow pain and swelling. He was urgently referred to [facility] office) saw Dr Jackqulyn Livings today at 11:45am for this problem. He now calls [place] back asking about antibiotic prescription.  I called him back to clarify this urgent issue, approx 1:30pm, he states that they told him similar to what I told him, could be gout vs infection, they attempted to send in an antibiotic but then there was a problem with the pharmacy not being able to fill it due to his Chronic Kidney Function. Now he is asking what antibiotic he needs to take or what to do. He will continue Tramadol PRN pain and ACE wrap and other Ortho recommendations.  I told him that I do not have the office note and record from [facility], so I cannot fully speak to their treatment plan or prescription.  Options are, he may call them back to clarify what to do about the antibiotic, he should have notified them that he has CKD.  Our office will contact [facility] to request the office note and record or speak with available nurse/physican that treated patient to clarify plan. He may need adjusted oral antibiotic for renal dosing. He states he has Keflex at home, I advised him to be cautious with this and do not start it since it has to be Renally dosed.  I advised him again, same as earlier in office, if he is not improving or any significant worsening symptoms of infection, he needs to go directly to hospital ED for further evaluation and management, may need IV and they could check labs there as well.  He will stay tuned for 24 hours until either our office or Ortho contacts him back with rx for antibiotic or further plans, if does not hear back he should contact us or go seek treatment at hospital.  [name], [facility] 06/09/2016, 1:41 PM

## 2016-06-09 NOTE — Telephone Encounter (Signed)
UPDATE 5/15 at 4:30pm  Our office contacted [facility] but could not obtain the office note or speak with available provider until tomorrow 5/16.  I called [facility] and spoke with Pharmacist Page, she confirmed the following events.  1. [facility] called them to clarify renal dosing antibiotic. Initially considered Bactrim. This was never prescribed and switched to Keflex.  2. Patient called pharmacy asking about taking his old Keflex at home, they told him not to take the old rx, and that a new one would be prescribed.  3. [facility] sent in new rx today for Keflex antibiotic 250mg  every OTHER day (renal dosing for CrCl < 20) #10 pills for duration of 20 days. Patient has already picked up the new rx.  No new antibiotic rx is needed at this time. He should continue the new Keflex rx. Follow-up as advised.  Attempted to call patient back to finalize this plan, did not reach him,  Left detailed voicemail.  [name], [facility] 06/09/2016, 4:39 PM

## 2016-07-03 ENCOUNTER — Ambulatory Visit (INDEPENDENT_AMBULATORY_CARE_PROVIDER_SITE_OTHER): Payer: Medicaid Other | Admitting: Family Medicine

## 2016-07-03 ENCOUNTER — Encounter: Payer: Self-pay | Admitting: Family Medicine

## 2016-07-03 VITALS — BP 109/85 | HR 79 | Temp 98.1°F | Resp 16 | Ht 69.0 in | Wt 375.0 lb

## 2016-07-03 DIAGNOSIS — M150 Primary generalized (osteo)arthritis: Secondary | ICD-10-CM | POA: Diagnosis not present

## 2016-07-03 DIAGNOSIS — Z794 Long term (current) use of insulin: Secondary | ICD-10-CM | POA: Diagnosis not present

## 2016-07-03 DIAGNOSIS — E1165 Type 2 diabetes mellitus with hyperglycemia: Secondary | ICD-10-CM | POA: Diagnosis not present

## 2016-07-03 DIAGNOSIS — M159 Polyosteoarthritis, unspecified: Secondary | ICD-10-CM

## 2016-07-03 DIAGNOSIS — E1122 Type 2 diabetes mellitus with diabetic chronic kidney disease: Secondary | ICD-10-CM | POA: Diagnosis not present

## 2016-07-03 DIAGNOSIS — N184 Chronic kidney disease, stage 4 (severe): Secondary | ICD-10-CM | POA: Diagnosis not present

## 2016-07-03 DIAGNOSIS — IMO0002 Reserved for concepts with insufficient information to code with codable children: Secondary | ICD-10-CM

## 2016-07-03 LAB — POCT GLYCOSYLATED HEMOGLOBIN (HGB A1C): Hemoglobin A1C: 10.5 — AB (ref ?–5.7)

## 2016-07-03 MED ORDER — TIZANIDINE HCL 2 MG PO CAPS: 2.0000 mg | ORAL_CAPSULE | Freq: Two times a day (BID) | ORAL | 2 refills | Status: DC | PRN

## 2016-07-03 NOTE — Progress Notes (Signed)
Subjective:    Patient ID: [name], male    DOB: [birth year], 51 y.o.   MRN: [id number]  [name] is a 51 y.o. male presenting on 07/03/2016 for Diabetes (highest 124 and lowest 80)  HPI   CHRONIC DM, Type 2: Reports that he still has not started the Bydureon BCise injections since last visit 3 months later now. He watched video last time in our office and we did demo pen, but he had questions and forgot to bring the pen in today. Still reports improved blood sugar tests Checks CBG 1-2x daily, does not have log today. Meds: Lantus 20u daily at 5pm (has not changed dose) Currently on ACEi Lifestyle: Diet (still not always following DM diet) / Exercise (limited exercise tolerance due to obesity, limited mobility, and has dyspnea symptoms) - Complication with worsening CKD - He still has not scheduled Ophthalmology apt yet, now has ins coverage Denies further hypoglycemia, polyuria, visual changes, numbness or tingling.  Osteoarthritis of multiple joints / History of Gout / Morbid Obesity - Last seen 06/09/16 for R elbow pain, possible gout vs cellulitis, he was urgently referred to [facility], had evaluation treated with antibiotics. Has since resolved. - Today he reports chronic pain in general, not relieved fully by Tylenol. He still takes occasional Ibuprofen despite CKD-IV and CHF but he tries to limit this. Admits joint and muscle pain generalized, he is trying to be more active with some weight fluctuations based on his reading at home vs reading here on scale. - Denies any fall or injury, redness, worsening swelling, other joint pain  Social History  Substance Use Topics  . Smoking status: Never Smoker  . Smokeless tobacco: Never Used  . Alcohol use Yes     Comment: a glass of wine and a couple beers a day    Review of Systems Per HPI unless specifically indicated above     Objective:    BP 109/85   Pulse 79   Temp 98.1 F (36.7 C) (Oral)   Resp 16    Ht 5\' 9"  (1.753 m)   Wt (!) 375 lb (170.1 kg)   BMI 55.38 kg/m   Wt Readings from Last 3 Encounters:  07/03/16 (!) 375 lb (170.1 kg)  06/09/16 (!) 378 lb 8 oz (171.7 kg)  04/23/16 (!) 390 lb (176.9 kg)    Physical Exam  Constitutional: He is oriented to person, place, and time. He appears well-developed and well-nourished. No distress.  Chronically ill but currently well, comfortable, cooperative, morbidly obese  HENT:  Head: Normocephalic and atraumatic.  Mouth/Throat: Oropharynx is clear and moist.  Eyes: Conjunctivae are normal.  Cardiovascular: Normal rate, regular rhythm, normal heart sounds and intact distal pulses.   No murmur heard. Pulmonary/Chest: Effort normal. No respiratory distress. He has no wheezes. He has no rales.  Distant breath sounds due to large body habitus, speaks full sentences. No focal abnormal breath sounds.  Musculoskeletal: Normal range of motion. He exhibits edema (Improved bilateral lower extremity non pitting +1 edema with compression stockings on).  Neurological: He is alert and oriented to person, place, and time.  Appears drowsy today  Skin: Skin is warm and dry. No rash noted. He is not diaphoretic. No erythema.  Psychiatric: He has a normal mood and affect. His behavior is normal.  Well groomed, good eye contact, normal speech and thoughts. Poor insight into health conditions.  Nursing note and vitals reviewed.   Recent Labs  11/08/15 0851 02/19/16  0825 07/03/16 0935  HGBA1C 7.9 11.7 10.5*    Results for orders placed or performed in visit on 07/03/16  POCT HgB A1C  Result Value Ref Range   Hemoglobin A1C 10.5 (A) 5.7      Assessment & Plan:   Problem List Items Addressed This Visit    Type 2 diabetes mellitus, uncontrolled, with renal complications ([place]) - Primary    Today improved but still uncontrolled DM with A1c 10.5 (previously 11.7). Non adherence to new medication Bydureon now for 3 months. Complications - CKD-IV,  peripheral neuropathy  Plan:  1. Discussed my concerns with his medication non-adherence, he waited 3 months to start the Bydureon BCise weekly GLP1 injection because he was not sure how to do the injection, despite our demo in office and video and detailed instructions last time. Similarly he failed prior Bydureon pen due to difficulty with shaking it and twisting properly. 2. Demo again today with Bydureon BCise pen, patient demonstrated proficiency (this is not active pen cannot inject). He should start today with once weekly Bydureon BCise 3. Continue current insulin therapy Lantus 20 u daily in afternoon - no change for 2 weeks, if after 2 weeks fasting AM CBG is >150 persistently may titrate up by 1 unit every 3 days. Caution hypoglycemia on new agent now, still relatively low risk. But concern CKD with insulin. 4. Encourage improved lifestyle - low carb, low sugar diet, reduce portion size, continue improving regular exercise 3. Check CBG, bring log to next visit for review - again difficult to control his sugar and adjust insulin without log 4. Continue ASA, ACEi, Statin 5. Again advised to schedule DM ophtho exam, send record 6. Follow-up 3 months DM A1c - expressed my concerns that he needs to start adhering to treatment recommendations or else need to make a change, such as referral to Endocrinology for different approach or other      Relevant Medications   lisinopril (PRINIVIL,ZESTRIL) 2.5 MG tablet   Other Relevant Orders   POCT HgB A1C (Completed)   Primary osteoarthritis involving multiple joints    Subacute on chronic Knee and other joint / low back pain without known injury or trauma. Suspected likely due to underlying osteoarthritis / DJD also primarily with morbid obesity and deconditioning, inc stress on knees - No prior history of knee surgery, arthroscopy - Inadequate conservative therapy, limited response to Tylenol - contraindicated NSAIDs due to CKD  Plan: 1. Start  new med muscle relaxant Tizanidine 2mg  q 12 hr PRN - renal dose cautious with increasing based on CrCl < 30, max dose in 24 hours for now is 4 mg, may double up for bedtime dose only if needed - otherwise can consider slow titration up if helping. Caution sedation. 2. ContinueTylenol 500-1000mg  per dose TID PRN - future consider muscle relaxant vs topical NSAID diclofenac 3. RICE therapy (rest, ice, compression, elevation) for swelling, activity modification - recommend knee brace for support 4. Advised to limit or stop NSAIDs 5. Follow-up as needed, consider steroid injection as well for knees      Relevant Medications   tizanidine (ZANAFLEX) 2 MG capsule      Meds ordered this encounter  Medications  .       .       . tizanidine (ZANAFLEX) 2 MG capsule    Sig: Take 1 capsule (2 mg total) by mouth 2 (two) times daily as needed for muscle spasms. Or 2 at bedtime. Max dose 2 pills in  24 hours.    Dispense:  60 capsule    Refill:  2   Follow up plan: Return in about 3 months (around 10/03/2016) for diabetes.  [name], [facility] 07/03/2016, 12:14 PM

## 2016-07-03 NOTE — Assessment & Plan Note (Signed)
Subacute on chronic Knee and other joint / low back pain without known injury or trauma. Suspected likely due to underlying osteoarthritis / DJD also primarily with morbid obesity and deconditioning, inc stress on knees - No prior history of knee surgery, arthroscopy - Inadequate conservative therapy, limited response to Tylenol - contraindicated NSAIDs due to CKD  Plan: 1. Start new med muscle relaxant Tizanidine 2mg  q 12 hr PRN - renal dose cautious with increasing based on CrCl < 30, max dose in 24 hours for now is 4 mg, may double up for bedtime dose only if needed - otherwise can consider slow titration up if helping. Caution sedation. 2. ContinueTylenol 500-1000mg  per dose TID PRN - future consider muscle relaxant vs topical NSAID diclofenac 3. RICE therapy (rest, ice, compression, elevation) for swelling, activity modification - recommend knee brace for support 4. Advised to limit or stop NSAIDs 5. Follow-up as needed, consider steroid injection as well for knees

## 2016-07-03 NOTE — Patient Instructions (Addendum)
Thank you for coming to the clinic today.  1. For Diabetes  A1c 10.5 overall improved but we really need to start the medicine.  Use Bydureon BCise once weekly as discussed  Shake pen up and down for 15 seconds. Hold against skin for 15 seconds, take away after plunger stops moving. Throw pen away.  Keep taking Lantus 20 units daily.  Check fasting blood sugar in MORNING before food and drink - AFTER 2 week of using BYdureon then you may start gradually increasing Lantus ONLY if fasting blood sugar is >150 for 3 days straight. GO UP BY 1 unit every 3 days. STOP at 30 units if you reach this point. Or notify office.  For Pain..  Recommend to start taking Tylenol Extra Strength 500mg  tabs - take 1 to 2 tabs per dose (max 1000mg ) every 6-8 hours for pain (take regularly, don't skip a dose for next 7 days), max 24 hour daily dose is 6 tablets or 3000mg . In the future you can repeat the same everyday Tylenol course for 1-2 weeks at a time.   Do not take Ibuprofen, Advil, Aleve, Motrin  Start new rx Tizanidine muscle relaxant, take 1 capsule daily as needed to start, caution drowsy and sedation. If needed you can increase to 2 capsules in 24 hours, either 1 every 12 hours or take 2 at bedtime.  Please schedule a Follow-up Appointment to: Return in about 3 months (around 10/03/2016) for diabetes.  If you have any other questions or concerns, please feel free to call the clinic or send a message through [place]. You may also schedule an earlier appointment if necessary.  [name], DO [place]

## 2016-07-03 NOTE — Assessment & Plan Note (Signed)
Today improved but still uncontrolled DM with A1c 10.5 (previously 11.7). Non adherence to new medication Bydureon now for 3 months. Complications - CKD-IV, peripheral neuropathy  Plan:  1. Discussed my concerns with his medication non-adherence, he waited 3 months to start the Bydureon BCise weekly GLP1 injection because he was not sure how to do the injection, despite our demo in office and video and detailed instructions last time. Similarly he failed prior Bydureon pen due to difficulty with shaking it and twisting properly. 2. Demo again today with Bydureon BCise pen, patient demonstrated proficiency (this is not active pen cannot inject). He should start today with once weekly Bydureon BCise 3. Continue current insulin therapy Lantus 20 u daily in afternoon - no change for 2 weeks, if after 2 weeks fasting AM CBG is >150 persistently may titrate up by 1 unit every 3 days. Caution hypoglycemia on new agent now, still relatively low risk. But concern CKD with insulin. 4. Encourage improved lifestyle - low carb, low sugar diet, reduce portion size, continue improving regular exercise 3. Check CBG, bring log to next visit for review - again difficult to control his sugar and adjust insulin without log 4. Continue ASA, ACEi, Statin 5. Again advised to schedule DM ophtho exam, send record 6. Follow-up 3 months DM A1c - expressed my concerns that he needs to start adhering to treatment recommendations or else need to make a change, such as referral to Endocrinology for different approach or other

## 2016-07-21 LAB — HM DIABETES EYE EXAM

## 2016-08-06 ENCOUNTER — Ambulatory Visit (INDEPENDENT_AMBULATORY_CARE_PROVIDER_SITE_OTHER): Payer: Self-pay | Admitting: Vascular Surgery

## 2016-08-10 ENCOUNTER — Other Ambulatory Visit: Payer: Self-pay | Admitting: Family Medicine

## 2016-08-10 DIAGNOSIS — E1165 Type 2 diabetes mellitus with hyperglycemia: Principal | ICD-10-CM

## 2016-08-10 DIAGNOSIS — E1122 Type 2 diabetes mellitus with diabetic chronic kidney disease: Secondary | ICD-10-CM

## 2016-08-10 DIAGNOSIS — IMO0002 Reserved for concepts with insufficient information to code with codable children: Secondary | ICD-10-CM

## 2016-08-10 DIAGNOSIS — N184 Chronic kidney disease, stage 4 (severe): Principal | ICD-10-CM

## 2016-08-10 DIAGNOSIS — Z794 Long term (current) use of insulin: Principal | ICD-10-CM

## 2016-08-10 MED ORDER — ALBIGLUTIDE 30 MG ~~LOC~~ PEN: 30.0000 mg | PEN_INJECTOR | SUBCUTANEOUS | 5 refills | Status: DC

## 2016-08-10 NOTE — Telephone Encounter (Signed)
Received fax from [employer] for requesting PA for Bydureon. Patient apparently never received this medication, and has a history of non adherence. Review of Medicaid PDL currently shows old pen Bydureon and Tanzeum as options for GLP1 for diabetes management. He has failed Bydureon pen due to difficulty with use, which is why we have tried to get him covered on alternative options such as Bydureon BCise or Trulicity.  I will switch rx to Tanzeum 30mg  weekly [place] injection for GLP1 therapy. Discontinue Bydureon BCise.

## 2016-08-11 ENCOUNTER — Other Ambulatory Visit: Payer: Self-pay

## 2016-09-09 ENCOUNTER — Telehealth: Payer: Self-pay | Admitting: Family Medicine

## 2016-09-09 NOTE — Telephone Encounter (Signed)
Pt advised as per Dr. Raliegh Ip to use mouth wash which can help with infection and inflammation. It's OTC oral debridement, peroxyl or Orajel.

## 2016-09-09 NOTE — Telephone Encounter (Signed)
Pt said he has been taking ibuprofen and he has been having pain and swelling.  Please call [phone]

## 2016-09-10 ENCOUNTER — Ambulatory Visit (INDEPENDENT_AMBULATORY_CARE_PROVIDER_SITE_OTHER): Payer: Self-pay | Admitting: Vascular Surgery

## 2016-10-01 ENCOUNTER — Telehealth: Payer: Self-pay

## 2016-10-01 DIAGNOSIS — G894 Chronic pain syndrome: Secondary | ICD-10-CM

## 2016-10-01 DIAGNOSIS — M159 Polyosteoarthritis, unspecified: Secondary | ICD-10-CM

## 2016-10-01 DIAGNOSIS — M150 Primary generalized (osteo)arthritis: Principal | ICD-10-CM

## 2016-10-01 MED ORDER — TRAMADOL HCL 50 MG PO TABS: 50.0000 mg | ORAL_TABLET | Freq: Two times a day (BID) | ORAL | 0 refills | Status: DC | PRN

## 2016-10-01 NOTE — Addendum Note (Signed)
Addended by: [name] on: 10/01/2016 01:58 PM   Modules accepted: Orders

## 2016-10-01 NOTE — Telephone Encounter (Signed)
Last visit with me 07/03/16 discussed osteoarthritis multiple joints and chronic pain. He was started on Tizanidine muscle relaxant, and increased Tylenol. Limiting NSAIDs due to CKD.  I can prescribe him a short term supply of tramadol now, to help get him to his next apt next week. We can review this further at that time. We may not be able to provide long-term rx for Tramadol, but this is one of the few safe options due to his kidney function. It is important that he does not take anti-inflammatories.  He may pick up rx anytime from office for Tramadol.  [name], [facility] 10/01/2016, 1:55 PM

## 2016-10-01 NOTE — Telephone Encounter (Signed)
Pt advised will pick up Rx tomorrow.

## 2016-10-01 NOTE — Telephone Encounter (Signed)
Pt is requesting tramadol for leg pain and neck pain he is taking his sister's  meds from past 2 days don't see on his med list.

## 2016-10-04 ENCOUNTER — Emergency Department: Payer: Medicaid Other

## 2016-10-04 ENCOUNTER — Encounter: Payer: Self-pay | Admitting: Emergency Medicine

## 2016-10-04 ENCOUNTER — Emergency Department
Admission: EM | Admit: 2016-10-04 | Discharge: 2016-10-04 | Discharge status: Against Medical Advice | Payer: Medicaid Other | Attending: Internal Medicine | Admitting: Internal Medicine

## 2016-10-04 DIAGNOSIS — N184 Chronic kidney disease, stage 4 (severe): Secondary | ICD-10-CM | POA: Diagnosis not present

## 2016-10-04 DIAGNOSIS — E871 Hypo-osmolality and hyponatremia: Secondary | ICD-10-CM | POA: Diagnosis not present

## 2016-10-04 DIAGNOSIS — K068 Other specified disorders of gingiva and edentulous alveolar ridge: Secondary | ICD-10-CM | POA: Diagnosis present

## 2016-10-04 DIAGNOSIS — Z79899 Other long term (current) drug therapy: Secondary | ICD-10-CM | POA: Insufficient documentation

## 2016-10-04 DIAGNOSIS — I130 Hypertensive heart and chronic kidney disease with heart failure and stage 1 through stage 4 chronic kidney disease, or unspecified chronic kidney disease: Secondary | ICD-10-CM | POA: Diagnosis not present

## 2016-10-04 DIAGNOSIS — Z7982 Long term (current) use of aspirin: Secondary | ICD-10-CM | POA: Diagnosis not present

## 2016-10-04 DIAGNOSIS — E1122 Type 2 diabetes mellitus with diabetic chronic kidney disease: Secondary | ICD-10-CM | POA: Diagnosis not present

## 2016-10-04 DIAGNOSIS — J45909 Unspecified asthma, uncomplicated: Secondary | ICD-10-CM | POA: Diagnosis not present

## 2016-10-04 DIAGNOSIS — A691 Other Vincent's infections: Secondary | ICD-10-CM | POA: Diagnosis not present

## 2016-10-04 DIAGNOSIS — I5022 Chronic systolic (congestive) heart failure: Secondary | ICD-10-CM | POA: Insufficient documentation

## 2016-10-04 LAB — COMPREHENSIVE METABOLIC PANEL
ALK PHOS: 118 U/L (ref 38–126)
ALT: 29 U/L (ref 17–63)
ANION GAP: 14 (ref 5–15)
AST: 32 U/L (ref 15–41)
Albumin: 3.2 g/dL — ABNORMAL LOW (ref 3.5–5.0)
BUN: 50 mg/dL — ABNORMAL HIGH (ref 6–20)
CALCIUM: 8.7 mg/dL — AB (ref 8.9–10.3)
CO2: 37 mmol/L — AB (ref 22–32)
Chloride: 73 mmol/L — ABNORMAL LOW (ref 101–111)
Creatinine, Ser: 2.89 mg/dL — ABNORMAL HIGH (ref 0.61–1.24)
GFR calc non Af Amer: 24 mL/min — ABNORMAL LOW (ref >60)
GFR, EST AFRICAN AMERICAN: 28 mL/min — AB (ref >60)
Glucose, Bld: 125 mg/dL — ABNORMAL HIGH (ref 65–99)
Potassium: 3.7 mmol/L (ref 3.5–5.1)
SODIUM: 124 mmol/L — AB (ref 135–145)
Total Bilirubin: 0.9 mg/dL (ref 0.3–1.2)
Total Protein: 7.6 g/dL (ref 6.5–8.1)

## 2016-10-04 LAB — CBC WITH DIFFERENTIAL/PLATELET
BASOS ABS: 0.1 10*3/uL (ref 0–0.1)
BASOS PCT: 1 %
EOS ABS: 0 10*3/uL (ref 0–0.7)
EOS PCT: 1 %
HCT: 46 % (ref 40.0–52.0)
Hemoglobin: 15.6 g/dL (ref 13.0–18.0)
Lymphocytes Relative: 21 %
Lymphs Abs: 1 10*3/uL (ref 1.0–3.6)
MCH: 30.9 pg (ref 26.0–34.0)
MCHC: 34 g/dL (ref 32.0–36.0)
MCV: 90.7 fL (ref 80.0–100.0)
Monocytes Absolute: 0.7 10*3/uL (ref 0.2–1.0)
Monocytes Relative: 14 %
Neutro Abs: 3.1 10*3/uL (ref 1.4–6.5)
Neutrophils Relative %: 63 %
PLATELETS: 150 10*3/uL (ref 150–440)
RBC: 5.07 MIL/uL (ref 4.40–5.90)
RDW: 15.6 % — ABNORMAL HIGH (ref 11.5–14.5)
WBC: 4.9 10*3/uL (ref 3.8–10.6)

## 2016-10-04 LAB — PROTIME-INR
INR: 1.02
Prothrombin Time: 13.3 seconds (ref 11.4–15.2)

## 2016-10-04 LAB — APTT: APTT: 26 s (ref 24–36)

## 2016-10-04 LAB — BRAIN NATRIURETIC PEPTIDE: B Natriuretic Peptide: 368 pg/mL — ABNORMAL HIGH (ref 0.0–100.0)

## 2016-10-04 MED ORDER — CHLORHEXIDINE GLUCONATE 0.12 % MT SOLN: 15.0000 mL | Freq: Two times a day (BID) | OROMUCOSAL | 0 refills | Status: DC

## 2016-10-04 MED ORDER — METRONIDAZOLE 500 MG PO TABS: 500.0000 mg | ORAL_TABLET | Freq: Two times a day (BID) | ORAL | 0 refills | Status: DC

## 2016-10-04 MED ORDER — SODIUM CHLORIDE 0.9 % IV BOLUS (SEPSIS): 1000.0000 mL | Freq: Once | INTRAVENOUS | Status: DC

## 2016-10-04 MED ORDER — METRONIDAZOLE 500 MG PO TABS
500.0000 mg | ORAL_TABLET | Freq: Once | ORAL | Status: AC
  Administered 2016-10-04: 500 mg via ORAL
  Filled 2016-10-04: qty 1

## 2016-10-04 NOTE — ED Triage Notes (Addendum)
Pt reports has had bleeding from gums for 2 days.  Worse today. Pt has cup with moderate amount of blood.  Significant blood noted in mouth at this time.  No blood thinners. No hx same. Denies [facility]

## 2016-10-04 NOTE — ED Notes (Signed)
AAOx3.  Skin warm and dry.  NAD.  Patient signed AMA.  Had in debth discussion with Dr. Reita Cliche with writer as witness r/t need for further evaluation.  Patient declined and signed out AMA.

## 2016-10-04 NOTE — ED Notes (Signed)
Dr saidecki visualized pt. Orders placed.

## 2016-10-04 NOTE — ED Provider Notes (Addendum)
[facility] Emergency Department Provider Note ____________________________________________   I have reviewed the triage vital signs and the triage nursing note.  HISTORY  Chief Complaint bleeding from mouth   Historian Patient  HPI [name] is a 51 y.o. male here for 2 days of bleeding from the mouth, he is not sure where its from.  History of swelling of the gums, states he's had a mouthwash for it before.  States takes months to get into a dentist.  Mild nasal congestion, does not feel it's due to nosebleed.  No fever.  No systemic illness symptoms.  History of kidney disease, states off ibuprofen.      Past Medical History:  Diagnosis Date  . Allergy   . Asthma   . Dilated cardiomyopathy ([place])   . Gout   . Hypertension   . Morbid obesity ([place])   . Reflux     Patient Active Problem List   Diagnosis Date Noted  . Chronic restrictive lung disease 02/19/2016  . CHF (congestive heart failure) ([place]) 01/24/2016  . Primary osteoarthritis involving multiple joints 01/24/2016  . Asthma 09/03/2015  . Elevated transaminase level 06/04/2015  . Excessive daytime sleepiness 05/07/2015  . GERD (gastroesophageal reflux disease) 05/07/2015  . Noncompliance 04/09/2015  . Allergic rhinitis 04/09/2015  . Dyslipidemia, goal LDL below 100 02/15/2015  . 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 Vitamin D deficiency 01/17/2015  . Medication monitoring encounter 01/17/2015  . Elevated CO2 level 01/17/2015  . Tobacco abuse, episodic 01/17/2015  . Insomnia 01/17/2015  . Current tobacco use 01/11/2015  . Benign hypertension 07/10/2014  . Proteinuria 07/10/2014  . Type 2 diabetes mellitus, uncontrolled, with renal complications ([place]) 92/11/9415  . CKD (chronic kidney disease), stage IV ([place]) 07/10/2014  . Secondary renal hyperparathyroidism ([place]) 07/10/2014  . Dilated cardiomyopathy ([place])   . Reflux   . Morbid  obesity ([place])   . Gout   . Personal history of other diseases of the circulatory system 02/02/2013  . Hypertension 02/02/2013  . Chronic systolic heart failure ([place]) 04/29/2011    History reviewed. No pertinent surgical history.  Prior to Admission medications   Medication Sig Start Date End Date Taking? Authorizing Provider  albuterol (PROVENTIL) (2.5 MG/3ML) 0.083% nebulizer solution Take 3 mLs (2.5 mg total) by nebulization every 6 (six) hours as needed for wheezing or shortness of breath. 10/10/15  Yes Krebs, Amy Lauren, NP  albuterol (VENTOLIN HFA) 108 (90 Base) MCG/ACT inhaler Inhale 2 puffs into the lungs every 4 (four) hours as needed for wheezing or shortness of breath. 02/06/15  Yes [name], [name], MD  allopurinol (ZYLOPRIM) 100 MG tablet Take 1 tablet (100 mg total) by mouth daily. 04/29/16  Yes [name], [name], DO  aspirin 81 MG chewable tablet Chew 81 mg by mouth daily. 02/15/15  Yes [provider]  atorvastatin (LIPITOR) 20 MG tablet Take 1 tablet (20 mg total) by mouth daily. 11/08/15  Yes Krebs, Amy Lauren, NP  beclomethasone (QVAR) 80 MCG/ACT inhaler Inhale 2 puffs into the lungs 2 (two) times daily. 09/03/15  Yes Krebs, Amy Lauren, NP  bumetanide (BUMEX) 1 MG tablet Take 1 mg by mouth. 04/13/16 04/13/17 Yes [provider]  carvedilol (COREG) 25 MG tablet Take 2 tablets (50 mg total) by mouth 2 (two) times daily with a meal. 03/03/16  Yes [name], Alexander J, DO  fluticasone (FLONASE) 50 MCG/ACT nasal spray Place 2 sprays into both nostrils daily. 05/07/15  Yes Krebs, Amy Lauren, NP  Insulin Glargine (LANTUS) 100 UNIT/ML Solostar Pen Inject 18 Units into the skin daily at 10 pm. Patient taking differently: Inject 20 Units into the skin daily at 10 pm.  08/06/15  Yes Krebs, Amy Lauren, NP  blood glucose meter kit and supplies KIT Brand of choice; LON 99 months; check FSBS 3x a day; E11.65; disp one meter with 100 strips + 1 refill of strips 01/17/15   [name],  Melinda P, MD  furosemide (LASIX) 40 MG tablet Take 120 mg by mouth. 01/22/16   [provider]  Insulin Pen Needle (B-D UF III MINI PEN NEEDLES) 31G X 5 MM MISC 1 each by Does not apply route daily. Use once daily with Victoza. 03/11/16   [name], [name], DO  lisinopril (PRINIVIL,ZESTRIL) 2.5 MG tablet Take 2.5 mg by mouth. 06/12/16 06/12/17  [provider]  lisinopril (PRINIVIL,ZESTRIL) 30 MG tablet Take 1 tablet (30 mg total) by mouth daily. 06/04/15   Krebs, Genevie Cheshire, NP  loratadine (CLARITIN) 10 MG tablet Take 1 tablet (10 mg total) by mouth daily. 10/10/15   Krebs, Genevie Cheshire, NP  magnesium oxide (MAG-OX) 400 MG tablet Take 400 mg by mouth. 06/12/16 06/12/17  [provider]  metolazone (ZAROXOLYN) 5 MG tablet Take 5 mg by mouth. 04/20/16 05/20/16  [provider]  metolazone (ZAROXOLYN) 5 MG tablet Take 5 mg by mouth. 04/20/16   [provider]  omeprazole (PRILOSEC) 40 MG capsule Take 1 capsule (40 mg total) by mouth daily. 06/05/15   Krebs, Genevie Cheshire, NP  ranitidine (ZANTAC) 150 MG capsule Take 1 capsule (150 mg total) by mouth every evening. 05/13/16   [name], [name], DO  spironolactone (ALDACTONE) 25 MG tablet Take 25 mg by mouth daily. 03/28/15 03/27/16  [provider]  tizanidine (ZANAFLEX) 2 MG capsule Take 1 capsule (2 mg total) by mouth 2 (two) times daily as needed for muscle spasms. Or 2 at bedtime. Max dose 2 pills in 24 hours. 07/03/16   [name], [name], DO  traMADol (ULTRAM) 50 MG tablet Take 1-2 tablets (50-100 mg total) by mouth every 12 (twelve) hours as needed. 10/01/16   [name], [name], DO  vitamin C (ASCORBIC ACID) 500 MG tablet Take 1,000 mg by mouth daily. 04/29/11   [provider]  Vitamin D, Ergocalciferol, (DRISDOL) 50000 units CAPS capsule Take 1 capsule (50,000 Units total) by mouth every 30 (thirty) days. 04/15/15   Arnetha Courser, MD  Zinc 50 MG CAPS Take 50 mg by mouth daily.     [provider]    No Known Allergies  Family History  Problem Relation Age of Onset  . Diabetes Mother   . Hypertension Mother   . Alcohol abuse Father   . Glaucoma Father   . Diabetes Father   . Hypertension Father   . Cancer Neg Hx   . COPD Neg Hx   . Heart disease Neg Hx   . Stroke Neg Hx     Social History Social History  Substance Use Topics  . Smoking status: Never Smoker  . Smokeless tobacco: Never Used  . Alcohol use Yes     Comment: a glass of wine and a couple beers a day    Review of Systems  Constitutional: Negative for fever. Eyes: Negative for visual changes. ENT: Negative for sore throat.  Pos for mouth bleeding as per hpi. Cardiovascular: Negative for chest pain. Respiratory: Negative for shortness of breath. Gastrointestinal:  Negative for abdominal pain, vomiting and diarrhea. Genitourinary: Negative for dysuria. Musculoskeletal: Negative for back pain. Skin: Negative for rash. Neurological: Negative for headache.  ____________________________________________   PHYSICAL EXAM:  VITAL SIGNS: ED Triage Vitals  Enc Vitals Group     BP 10/04/16 1036 123/70     Pulse Rate 10/04/16 1036 77     Resp 10/04/16 1036 20     Temp 10/04/16 1036 97.7 F (36.5 C)     Temp Source 10/04/16 1036 Oral     SpO2 10/04/16 1036 94 %     Weight 10/04/16 1027 (!) 360 lb (163.3 kg)     Height 10/04/16 1027 '5\' 11"'  (1.803 m)     Head Circumference --      Peak Flow --      Pain Score --      Pain Loc --      Pain Edu? --      Excl. in [place]? --      Constitutional: Alert and oriented. Well appearing and in no distress. HEENT   Head: Normocephalic and atraumatic.      Eyes: Conjunctivae are normal. Pupils equal and round.       Ears:         Nose: No congestion/rhinnorhea.   Mouth/Throat: Mucous membranes are moist.  Blood in mouth, rinse out and found to be coming from bleeding gums at left posterior molar where tooth is loose as well as left  posteriormost   Neck: No stridor. Cardiovascular/Chest: Normal rate, regular rhythm.  No murmurs, rubs, or gallops. Respiratory: Normal respiratory effort without tachypnea nor retractions. Breath sounds are clear and equal bilaterally. Dyspneic, intermittent mild wheeze. Gastrointestinal: Soft. No distention, no guarding, no rebound. Nontender.  Morbidly obese.   Genitourinary/rectal:Deferred Musculoskeletal: Nontender with normal range of motion in all extremities. No joint effusions.  No lower extremity tenderness.  No edema. Neurologic:  Normal speech and language. No gross or focal neurologic deficits are appreciated. Skin:  Skin is warm, dry and intact. No rash noted. Psychiatric: Mood and affect are normal. Speech and behavior are normal. Patient exhibits appropriate insight and judgment.   ____________________________________________  LABS (pertinent positives/negatives)  Labs Reviewed  CBC WITH DIFFERENTIAL/PLATELET - Abnormal; Notable for the following:       Result Value   RDW 15.6 (*)    All other components within normal limits  COMPREHENSIVE METABOLIC PANEL - Abnormal; Notable for the following:    Sodium 124 (*)    Chloride 73 (*)    CO2 37 (*)    Glucose, Bld 125 (*)    BUN 50 (*)    Creatinine, Ser 2.89 (*)    Calcium 8.7 (*)    Albumin 3.2 (*)    GFR calc non Af Amer 24 (*)    GFR calc Af Amer 28 (*)    All other components within normal limits  BRAIN NATRIURETIC PEPTIDE - Abnormal; Notable for the following:    B Natriuretic Peptide 368.0 (*)    All other components within normal limits  PROTIME-INR  APTT    ____________________________________________    EKG I, Lisa Roca, MD, the attending physician have personally viewed and interpreted all ECGs.  None ____________________________________________  RADIOLOGY All Xrays were viewed by me. Imaging interpreted by  Radiologist.  [id number] __________________________________________  PROCEDURES  Procedure(s) performed: None  Critical Care performed: None  ____________________________________________   ED COURSE / ASSESSMENT AND PLAN  Pertinent labs & imaging results that were available during my  care of the patient were reviewed by me and considered in my medical decision making (see chart for details).    Patient has necrotizing gingivitis on left posterior molar upper and lower.  His bleeding is intermittent and ultimately reassuring with labs stable from a bleeding standpoint.  I discussed with [facility] dentist, Dr. Fredonia Highland, who I was able to fax a referral form to and states the office will give him a call and patient should call on Tuesday to coordinate appointment for evaluation and likely debridement this coming week.  On laboratory studies found to have acute hyponatremia. This is questionably multifactorial, but potentially most likely due to CHF. He does seem to be somewhat short of breath but is having no hypoxia. I did add on chest x-ray. I was initially going to give him IV fluids, the patient is quite hesitant to take this.  I spoke with Dr. Tressia Miners for admission.  I went back to discuss with the patient and patient states that he is not staying in the hospital. He states that he has not been drinking water because he is concerned about congestive heart failure. He's been drinking beer to make up for the water. He has increased his Lasix. We discussed all of the potential sources and causes that are contributing to his severe acute hyponatremia and discussed the risk of refusing to stay in the hospital for further workup and treatment including worsening congestive heart failure, heart attack, seizure, coma, kidney failure, and death.  Patient's family member as well as mom are also in the room and states that he has hard headed. He is alert and oriented 3 and is able to repeat back to  me the risks that he is taking.      CONSULTATIONS:  Hospitalist for admission.   Patient / Family / Caregiver informed of clinical course, medical decision-making process, and agree with plan.  discharge instructions were discussed with patient in the presence of family member and mother. Patient is leaving [place].  you were evaluated for gum bleeding and this is due to necrotizing gingivitis also called trench mouth. You're being prescribed antibiotic as well as mouthwash. You need to follow up with [facility] dentistry [facility] for pointed this week.  Your blood work showed a low sodium, and we discussed how this was dangerously low and he needed to stay in the hospital. You chose to leave [place], and please follow up with her primary doctor tomorrow.  Return to the emergency room immediately for any headache, weakness, confusion or altered mental status, seizure, trouble breathing, shortness of breath, chest pain, swelling, or any other symptoms concerning to you.   ___________________________________________   FINAL CLINICAL IMPRESSION(S) / ED DIAGNOSES   Final diagnoses:  Acute necrotizing gingivitis  Acute hyponatremia              Note: This dictation was prepared with Dragon dictation. Any transcriptional errors that result from this process are unintentional    Lisa Roca, MD 10/04/16 1345    Lisa Roca, MD 10/04/16 1352

## 2016-10-04 NOTE — Discharge Instructions (Signed)
you were evaluated for gum bleeding and this is due to necrotizing gingivitis also called trench mouth. You're being prescribed antibiotic as well as mouthwash. You need to follow up with the [facility] dentistry clinic for pointed this week.  Your blood work showed a low sodium, and we discussed how this was dangerously low and he needed to stay in the hospital. You chose to leave [place], and please follow up with her primary doctor tomorrow.  Return to the emergency room immediately for any headache, weakness, confusion or altered mental status, seizure, trouble breathing, shortness of breath, chest pain, swelling, or any other symptoms concerning to you.

## 2016-10-04 NOTE — ED Notes (Signed)
Pt refused saline bolus, stating he "has had too much fluid already this week." Pt also refused chest x-ray. When explained to him that the x-ray will tell us if there is fluid on his lungs and give [place] an idea of how much fluid, he replied, "I have had fluid on my lungs before. I know what it feels like. I don't need an x-ray. I don't have fluid on my lungs."

## 2016-10-07 ENCOUNTER — Ambulatory Visit: Payer: Medicaid Other | Admitting: Family Medicine

## 2016-10-08 ENCOUNTER — Ambulatory Visit: Payer: Medicaid Other | Admitting: Family Medicine

## 2016-10-08 ENCOUNTER — Telehealth: Payer: Self-pay | Admitting: Family Medicine

## 2016-10-08 NOTE — Telephone Encounter (Signed)
Pt went to ER for gingivitis and their advised was " He states that he has not been drinking water because he is concerned about congestive heart failure. He's been drinking beer to make up for the water. He has increased his Lasix. We discussed all of the potential sources and causes that are contributing to his severe acute hyponatremia and discussed the risk of refusing to stay in the hospital for further workup and treatment including worsening congestive heart failure, heart attack, seizure, coma, kidney failure, and death." He state that he was in bed yesterday and felt having liver shake and muscle ache wasn't sure having seizure but some one told him it's just muscle ache and has some question about low Na level Please suggest ?

## 2016-10-08 NOTE — Telephone Encounter (Signed)
Pt said his sodium is low and has questions.  Please call [phone]

## 2016-10-08 NOTE — Telephone Encounter (Signed)
Called patient back 9/13, spoke with him to review his concerns. He is asking about how to "raise sodium", he states he was dehydrated from incorrectly taking fluid pill was taking 4 of the Bumex twice daily, and he should not have been on that dose, he was dehydrated, poor PO intake due to gingivitis and also was taking in beer instead of water, he states he left AMA from hospital since he "knows his body best" and knows how to "take off the fluid" and "did not want the IV fluid".  I advised him that this was likely the cause of the low sodium all in combination and that he should improve his nutrition resume regular PO, normal hydration with water based on restrictions from cardiology, and resume normal dosing diuretics. I advised him that he should contact his Cardiologist with this information to keep them updated on fluid status and if changes need to be made.  If any worsening symptoms, concern for seizure, poor PO, dehydration or other concerns needs to go back to hospital for re-evaluation, there is limited I can offer him for managing his fluid balance in outpatient setting, and especially with impending weather we are closed tomorrow. He understands and has follow-up next week.  [name], [facility] 10/08/2016, 11:50 AM

## 2016-10-13 ENCOUNTER — Ambulatory Visit: Payer: Medicaid Other | Admitting: Family Medicine

## 2016-10-15 ENCOUNTER — Ambulatory Visit (INDEPENDENT_AMBULATORY_CARE_PROVIDER_SITE_OTHER): Payer: Medicaid Other | Admitting: Family Medicine

## 2016-10-15 ENCOUNTER — Encounter: Payer: Self-pay | Admitting: Family Medicine

## 2016-10-15 VITALS — BP 100/69 | HR 80 | Temp 98.5°F | Resp 16 | Ht 69.0 in | Wt 378.6 lb

## 2016-10-15 DIAGNOSIS — G894 Chronic pain syndrome: Secondary | ICD-10-CM | POA: Diagnosis not present

## 2016-10-15 DIAGNOSIS — E1122 Type 2 diabetes mellitus with diabetic chronic kidney disease: Secondary | ICD-10-CM | POA: Diagnosis not present

## 2016-10-15 DIAGNOSIS — E871 Hypo-osmolality and hyponatremia: Secondary | ICD-10-CM

## 2016-10-15 DIAGNOSIS — E1165 Type 2 diabetes mellitus with hyperglycemia: Secondary | ICD-10-CM | POA: Diagnosis not present

## 2016-10-15 DIAGNOSIS — N184 Chronic kidney disease, stage 4 (severe): Secondary | ICD-10-CM

## 2016-10-15 DIAGNOSIS — IMO0002 Reserved for concepts with insufficient information to code with codable children: Secondary | ICD-10-CM

## 2016-10-15 DIAGNOSIS — M150 Primary generalized (osteo)arthritis: Secondary | ICD-10-CM | POA: Diagnosis not present

## 2016-10-15 DIAGNOSIS — Z23 Encounter for immunization: Secondary | ICD-10-CM

## 2016-10-15 DIAGNOSIS — Z794 Long term (current) use of insulin: Secondary | ICD-10-CM | POA: Diagnosis not present

## 2016-10-15 DIAGNOSIS — M159 Polyosteoarthritis, unspecified: Secondary | ICD-10-CM

## 2016-10-15 MED ORDER — TRAMADOL HCL 50 MG PO TABS: 50.0000 mg | ORAL_TABLET | Freq: Two times a day (BID) | ORAL | 1 refills | Status: DC | PRN

## 2016-10-15 NOTE — Progress Notes (Signed)
Subjective:    Patient ID: [name], male    DOB: [birth year], 51 y.o.   MRN: [id number]  [name] is a 51 y.o. male presenting on 10/15/2016 for Hospitalization Follow-up (hyponatremia)   HPI   ED FOLLOW-UP VISIT  Hospital/Location: [facility] Date of ED Visit: 10/04/16  Reason for Presenting to ED: Bleeding gums and pain Primary (+Secondary) Diagnosis: Necrotizing Gingivitis, Hyponatremia, Acute on Chronic CHF  FOLLOW-UP  - ED provider note and record have been reviewed - Patient presents today about 11 days after recent ED visit. Brief summary of recent course, patient had symptoms of worsening chronic gum pain and bleeding, then difficulty eating, presented to ED with no improvement, exam in ED consistent with necrotizing gingivitis, [facility] consulted and attempted to get patient in within 1-2 weeks for oral debridement, also testing with labs BNP elevated, and Hyponatremia thought due to dehydration with poor oral intake from oral pain, he refused IVF fluids due to CHF, and he was also taking incorrect Bumex / diuretic dosing by accident, he left hospital ED AMA after requested admission. He was given Flagyl and mouthwash. - Today reports overall has fairly well after discharge from ED. Symptoms of oral gum bleeding and pain have improved but still persistent if flares up, he tries to avoid abrasive foods, has ensure and protein supplement, tries to stay hydrated - Now he is adhering to appropriate diuertic dosing - He was unable to get local dentist apt soon, next available is 4-6 weeks, he declined going to [facility] for Dentist sooner due to travel - New medications on discharge: Flagyl - Changes to current meds on discharge: None  Additionally - recent rx Tramadol by me due to limitations on other pain medicine for his chronic pain (joints and now oral / dental pain). He cannot take NSAIDs due to CKD-IV and CHF. He is already taking appropriate dose Tylenol, and needs  additional relief. Trial on short course Tramadol with good results, taking 30m tabs x 2 per dose 1-2 times daily, not every day, and usually gets very good relief. Asking about refill for future, he will only take when needed, not everyday. - Denies other substance use, and he is not interested in chronic pain management  I have reviewed the discharge medication list, and have reconciled the current and discharge medications today.   Current Outpatient Prescriptions:  .  albuterol (PROVENTIL) (2.5 MG/3ML) 0.083% nebulizer solution, Take 3 mLs (2.5 mg total) by nebulization every 6 (six) hours as needed for wheezing or shortness of breath., Disp: 150 mL, Rfl: 1 .  albuterol (VENTOLIN HFA) 108 (90 Base) MCG/ACT inhaler, Inhale 2 puffs into the lungs every 4 (four) hours as needed for wheezing or shortness of breath., Disp: 18 g, Rfl: 0 .  allopurinol (ZYLOPRIM) 100 MG tablet, Take 1 tablet (100 mg total) by mouth daily., Disp: 30 tablet, Rfl: 6 .  aspirin 81 MG chewable tablet, Chew 81 mg by mouth daily., Disp: , Rfl:  .  atorvastatin (LIPITOR) 20 MG tablet, Take 1 tablet (20 mg total) by mouth daily., Disp: 30 tablet, Rfl: 11 .  beclomethasone (QVAR) 80 MCG/ACT inhaler, Inhale 2 puffs into the lungs 2 (two) times daily., Disp: 1 Inhaler, Rfl: 12 .  blood glucose meter kit and supplies KIT, Brand of choice; LON 99 months; check FSBS 3x a day; E11.65; disp one meter with 100 strips + 1 refill of strips, Disp: 1 each, Rfl: 0 .  bumetanide (BUMEX) 1 MG  tablet, Take 1 mg by mouth., Disp: , Rfl:  .  carvedilol (COREG) 25 MG tablet, Take 2 tablets (50 mg total) by mouth 2 (two) times daily with a meal., Disp: 120 tablet, Rfl: 5 .  chlorhexidine (PERIDEX) 0.12 % solution, Use as directed 15 mLs in the mouth or throat 2 (two) times daily., Disp: 120 mL, Rfl: 0 .  fluticasone (FLONASE) 50 MCG/ACT nasal spray, Place 2 sprays into both nostrils daily., Disp: 16 g, Rfl: 11 .  furosemide (LASIX) 40 MG tablet,  Take 120 mg by mouth., Disp: , Rfl:  .  Insulin Glargine (LANTUS) 100 UNIT/ML Solostar Pen, Inject 18 Units into the skin daily at 10 pm. (Patient taking differently: Inject 20 Units into the skin daily at 10 pm. ), Disp: 15 mL, Rfl: 11 .  Insulin Pen Needle (B-D UF III MINI PEN NEEDLES) 31G X 5 MM MISC, 1 each by Does not apply route daily. Use once daily with Victoza., Disp: 100 each, Rfl: 12 .  lisinopril (PRINIVIL,ZESTRIL) 2.5 MG tablet, Take 2.5 mg by mouth., Disp: , Rfl:  .  lisinopril (PRINIVIL,ZESTRIL) 30 MG tablet, Take 1 tablet (30 mg total) by mouth daily., Disp: 90 tablet, Rfl: 3 .  loratadine (CLARITIN) 10 MG tablet, Take 1 tablet (10 mg total) by mouth daily., Disp: 30 tablet, Rfl: 11 .  magnesium oxide (MAG-OX) 400 MG tablet, Take 400 mg by mouth., Disp: , Rfl:  .  metolazone (ZAROXOLYN) 5 MG tablet, Take 5 mg by mouth., Disp: , Rfl:  .  omeprazole (PRILOSEC) 40 MG capsule, Take 1 capsule (40 mg total) by mouth daily., Disp: 90 capsule, Rfl: 3 .  ranitidine (ZANTAC) 150 MG capsule, Take 1 capsule (150 mg total) by mouth every evening., Disp: 90 capsule, Rfl: 3 .  tizanidine (ZANAFLEX) 2 MG capsule, Take 1 capsule (2 mg total) by mouth 2 (two) times daily as needed for muscle spasms. Or 2 at bedtime. Max dose 2 pills in 24 hours., Disp: 60 capsule, Rfl: 2 .  traMADol (ULTRAM) 50 MG tablet, Take 1-2 tablets (50-100 mg total) by mouth every 12 (twelve) hours as needed., Disp: 30 tablet, Rfl: 1 .  vitamin C (ASCORBIC ACID) 500 MG tablet, Take 1,000 mg by mouth daily., Disp: , Rfl:  .  Vitamin D, Ergocalciferol, (DRISDOL) 50000 units CAPS capsule, Take 1 capsule (50,000 Units total) by mouth every 30 (thirty) days., Disp: 1 capsule, Rfl: 0 .  Zinc 50 MG CAPS, Take 50 mg by mouth daily., Disp: , Rfl:  .  metolazone (ZAROXOLYN) 5 MG tablet, Take 5 mg by mouth., Disp: , Rfl:  .  spironolactone (ALDACTONE) 25 MG tablet, Take 25 mg by mouth daily., Disp: , Rfl:    ----------------------------------------------------- Additionally - due for A1c for Diabetes (previously uncontrolled), he is no longer using Bydureon BCise unable to get rx by report due to cost / coverage and other issues. He has continued Lantus and increased dose from 20 to 22.  Depression screen [facility] 2/9 10/15/2016 05/07/2015 04/09/2015  Decreased Interest 0 0 0  Down, Depressed, Hopeless 0 0 0  PHQ - 2 Score 0 0 0    ------------------------------------------------------------------------- Social History  Substance Use Topics  . Smoking status: Never Smoker  . Smokeless tobacco: Never Used  . Alcohol use Yes     Comment: a glass of wine and a couple beers a day    Review of Systems Per HPI unless specifically indicated above     Objective:  BP 100/69   Pulse 80   Temp 98.5 F (36.9 C) (Oral)   Resp 16   Ht '5\' 9"'  (1.753 m)   Wt (!) 378 lb 9.6 oz (171.7 kg)   BMI 55.91 kg/m   Wt Readings from Last 3 Encounters:  10/15/16 (!) 378 lb 9.6 oz (171.7 kg)  10/04/16 (!) 360 lb (163.3 kg)  07/03/16 (!) 375 lb (170.1 kg)    Physical Exam  Constitutional: He is oriented to person, place, and time. He appears well-developed and well-nourished. No distress.  Chronically ill but currently well, comfortable, cooperative, morbidly obese  HENT:  Head: Normocephalic and atraumatic.  Mouth/Throat: Oropharynx is clear and moist.  Generalized erythematous and edematous gums with extensive plaque on teeth. No obvious focal abscess or bleeding or drainage  Eyes: Conjunctivae are normal. Right eye exhibits no discharge. Left eye exhibits no discharge.  Neck: Normal range of motion. Neck supple.  Cardiovascular: Normal rate, regular rhythm, normal heart sounds and intact distal pulses.   No murmur heard. Pulmonary/Chest: Effort normal and breath sounds normal. No respiratory distress. He has no wheezes. He has no rales.  Some reduced breath sounds due to body habitus at baseline   Musculoskeletal: Normal range of motion. He exhibits edema (Stable 1+ pitting with compress stockings).  Lymphadenopathy:    He has no cervical adenopathy.  Neurological: He is alert and oriented to person, place, and time.  Skin: Skin is warm and dry. No rash noted. He is not diaphoretic. No erythema.  Psychiatric: His behavior is normal.  Nursing note and vitals reviewed.    Results for orders placed or performed during the hospital encounter of 10/04/16  CBC with Differential  Result Value Ref Range   WBC 4.9 3.8 - 10.6 K/uL   RBC 5.07 4.40 - 5.90 MIL/uL   Hemoglobin 15.6 13.0 - 18.0 g/dL   HCT 46.0 40.0 - 52.0 %   MCV 90.7 80.0 - 100.0 fL   MCH 30.9 26.0 - 34.0 pg   MCHC 34.0 32.0 - 36.0 g/dL   RDW 15.6 (H) 11.5 - 14.5 %   Platelets 150 150 - 440 K/uL   Neutrophils Relative % 63 %   Neutro Abs 3.1 1.4 - 6.5 K/uL   Lymphocytes Relative 21 %   Lymphs Abs 1.0 1.0 - 3.6 K/uL   Monocytes Relative 14 %   Monocytes Absolute 0.7 0.2 - 1.0 K/uL   Eosinophils Relative 1 %   Eosinophils Absolute 0.0 0 - 0.7 K/uL   Basophils Relative 1 %   Basophils Absolute 0.1 0 - 0.1 K/uL  Protime-INR  Result Value Ref Range   Prothrombin Time 13.3 11.4 - 15.2 seconds   INR 1.02   APTT  Result Value Ref Range   aPTT 26 24 - 36 seconds  Comprehensive metabolic panel  Result Value Ref Range   Sodium 124 (L) 135 - 145 mmol/L   Potassium 3.7 3.5 - 5.1 mmol/L   Chloride 73 (L) 101 - 111 mmol/L   CO2 37 (H) 22 - 32 mmol/L   Glucose, Bld 125 (H) 65 - 99 mg/dL   BUN 50 (H) 6 - 20 mg/dL   Creatinine, Ser 2.89 (H) 0.61 - 1.24 mg/dL   Calcium 8.7 (L) 8.9 - 10.3 mg/dL   Total Protein 7.6 6.5 - 8.1 g/dL   Albumin 3.2 (L) 3.5 - 5.0 g/dL   AST 32 15 - 41 U/L   ALT 29 17 - 63 U/L   Alkaline  Phosphatase 118 38 - 126 U/L   Total Bilirubin 0.9 0.3 - 1.2 mg/dL   GFR calc non Af Amer 24 (L) >60 mL/min   GFR calc Af Amer 28 (L) >60 mL/min   Anion gap 14 5 - 15  Brain natriuretic peptide  Result Value  Ref Range   B Natriuretic Peptide 368.0 (H) 0.0 - 100.0 pg/mL      Assessment & Plan:   Problem List Items Addressed This Visit    CKD (chronic kidney disease), stage IV ([place])    Check BMET, recent dehydration and overdiuresis, also hyponatremia AVOID NSAIDS, will rx Tramadol      Relevant Orders   BASIC METABOLIC PANEL WITH GFR   Primary osteoarthritis involving multiple joints    Stable chronic OA/DJD joint pain, worse with morbid obesity Contraindicated NSAIDs to CKD-IV Checked [place] CSRS for past 2 years, no red flags Proceed with Tramadol refill 41m take 1-2 per dose q 12 hr PRN, #30 and +1 refill only use as needed for acute and chronic pains, to avoid nephrotoxic agents [facility] good for 3 months, expire 01/25/17, only good for Tramadol Send UDS sample today      Relevant Medications   traMADol (ULTRAM) 50 MG tablet   Type 2 diabetes mellitus, uncontrolled, with renal complications (HCC)    Not focus of visit today, but due for A1c Will check A1c with lab draw Concern since he self discontinued Bydureon due to "difficulty obtaining medicine", see prior notes on non adherence and failure of other GLP1 Advised titrate insulin based on CBGs, continue inc from 22 to 24 and higher as needed based on fasting AM >150 Follow-up 3 months for DM A1c, discuss other GLP1 Ozempic and other options - limited due to CKD      Relevant Orders   Hemoglobin A1c    Other Visit Diagnoses    Hyponatremia    -  Primary   Will check BMET, seems more euvolemic now, improved PO intake and hydration, back on regular diuretic dosing Follow-up with Cardiology for diuretics in 4 wk   Relevant Orders   BASIC METABOLIC PANEL WITH GFR   Chronic pain syndrome       Relevant Medications   traMADol (ULTRAM) 50 MG tablet   Needs flu shot       Relevant Orders   Flu Vaccine QUAD 36+ mos IM (Completed)       Meds ordered this encounter  Medications  .  traMADol (ULTRAM) 50 MG tablet    Sig: Take 1-2 tablets (50-100 mg total) by mouth every 12 (twelve) hours as needed.    Dispense:  30 tablet    Refill:  1    Follow up plan: Return in about 3 months (around 01/14/2017) for Diabetes A1c.  ANobie Putnam [facility] 10/15/2016, 9:35 AM

## 2016-10-15 NOTE — Patient Instructions (Addendum)
Thank you for coming to the clinic today.  1. May take Tramadol as needed, sign contract today follow-up with Dentist  2. May continue Tylenol as needed  3. Continue increase Lantus as needed  4. CHeck labs today - will call with results  Please schedule a Follow-up Appointment to: Return in about 3 months (around 01/14/2017) for Diabetes A1c.  If you have any other questions or concerns, please feel free to call the clinic or send a message through [place]. You may also schedule an earlier appointment if necessary.  Additionally, you may be receiving a survey about your experience at our clinic within a few days to 1 week by e-mail or mail. We value your feedback.  [name], DO [place]

## 2016-10-15 NOTE — Assessment & Plan Note (Signed)
Check BMET, recent dehydration and overdiuresis, also hyponatremia AVOID NSAIDS, will rx Tramadol

## 2016-10-15 NOTE — Assessment & Plan Note (Addendum)
Stable chronic OA/DJD joint pain, worse with morbid obesity Contraindicated NSAIDs to CKD-IV Checked [place] CSRS for past 2 years, no red flags Proceed with Tramadol refill 50mg  take 1-2 per dose q 12 hr PRN, #30 and +1 refill only use as needed for acute and chronic pains, to avoid nephrotoxic agents Signed [facility] Controlled Substance contract today good for 3 months, expire 01/25/17, only good for Tramadol Send UDS sample today

## 2016-10-15 NOTE — Assessment & Plan Note (Signed)
Not focus of visit today, but due for A1c Will check A1c with lab draw Concern since he self discontinued Bydureon due to "difficulty obtaining medicine", see prior notes on non adherence and failure of other GLP1 Advised titrate insulin based on CBGs, continue inc from 22 to 24 and higher as needed based on fasting AM >150 Follow-up 3 months for DM A1c, discuss other GLP1 Ozempic and other options - limited due to CKD

## 2016-10-16 LAB — HEMOGLOBIN A1C
EAG (MMOL/L): 7.4 (calc)
Hgb A1c MFr Bld: 6.3 % of total Hgb — ABNORMAL HIGH (ref <5.7)
Mean Plasma Glucose: 134 (calc)

## 2016-10-16 LAB — BASIC METABOLIC PANEL WITH GFR
BUN/Creatinine Ratio: 10 (calc) (ref 6–22)
BUN: 26 mg/dL — AB (ref 7–25)
CALCIUM: 8.6 mg/dL (ref 8.6–10.3)
CO2: 39 mmol/L — AB (ref 20–32)
CREATININE: 2.51 mg/dL — AB (ref 0.60–1.35)
Chloride: 86 mmol/L — ABNORMAL LOW (ref 98–110)
GFR, EST NON AFRICAN AMERICAN: 29 mL/min/{1.73_m2} — AB (ref >=60)
GFR, Est African American: 34 mL/min/{1.73_m2} — ABNORMAL LOW (ref >=60)
Glucose, Bld: 74 mg/dL (ref 65–99)
Potassium: 3.5 mmol/L (ref 3.5–5.3)
Sodium: 133 mmol/L — ABNORMAL LOW (ref 135–146)

## 2016-10-19 ENCOUNTER — Ambulatory Visit (INDEPENDENT_AMBULATORY_CARE_PROVIDER_SITE_OTHER): Payer: Self-pay | Admitting: Vascular Surgery

## 2016-11-09 ENCOUNTER — Ambulatory Visit (INDEPENDENT_AMBULATORY_CARE_PROVIDER_SITE_OTHER): Payer: Medicaid Other | Admitting: Vascular Surgery

## 2016-11-11 ENCOUNTER — Encounter: Payer: Self-pay | Admitting: Family Medicine

## 2016-11-11 ENCOUNTER — Ambulatory Visit (INDEPENDENT_AMBULATORY_CARE_PROVIDER_SITE_OTHER): Payer: Medicaid Other | Admitting: Family Medicine

## 2016-11-11 ENCOUNTER — Ambulatory Visit: Payer: Medicaid Other | Admitting: Family Medicine

## 2016-11-11 VITALS — BP 94/62 | HR 76 | Temp 98.2°F | Resp 16 | Ht 69.0 in | Wt 362.0 lb

## 2016-11-11 DIAGNOSIS — J0110 Acute frontal sinusitis, unspecified: Secondary | ICD-10-CM | POA: Diagnosis not present

## 2016-11-11 MED ORDER — AMOXICILLIN-POT CLAVULANATE 875-125 MG PO TABS: 1.0000 | ORAL_TABLET | Freq: Two times a day (BID) | ORAL | 0 refills | Status: DC

## 2016-11-11 NOTE — Progress Notes (Signed)
Subjective:    Patient ID: [name], male    DOB: 21-Jul-1965, 51 y.o.   MRN: [id number]  [name] is a 51 y.o. male presenting on 11/11/2016 for Sinusitis (ear pain, HA , eye pain, cough onset 2 week)   HPI   SINUSITIS: - Reports recent onset sinusitis for past 3 weeks with gradual worsening increased sinus pressure on Left greater than Right, with increased pain as well, worse in afternoon usually, some occasional rhinorrhea, but no known purulent drainage - Tried taking Ibuprofen OTC - Tried Flonase, decongestants, mucinex limited relief - Admits headache frontal, previous ear pain - Denies any fevers/chills, sweats, nausea vomiting, cough, diarrhea, muscle aches  Additionally asking about use of Viagra, he has apt upcoming with Cardiologist, will ask to make sure if he is a candidate to try this, he has taken in many years ago in past.  Health Maintenance: - UTD on Flu Shot this season  Depression screen [facility] 2/9 10/15/2016 05/07/2015 04/09/2015  Decreased Interest 0 0 0  Down, Depressed, Hopeless 0 0 0  PHQ - 2 Score 0 0 0    Social History  Substance Use Topics  . Smoking status: Never Smoker  . Smokeless tobacco: Never Used  . Alcohol use Yes     Comment: a glass of wine and a couple beers a day    Review of Systems Per HPI unless specifically indicated above     Objective:    BP 94/62   Pulse 76   Temp 98.2 F (36.8 C) (Oral)   Resp 16   Ht 5\' 9"  (1.753 m)   Wt (!) 362 lb (164.2 kg)   SpO2 93%   BMI 53.46 kg/m   Wt Readings from Last 3 Encounters:  11/11/16 (!) 362 lb (164.2 kg)  10/15/16 (!) 378 lb 9.6 oz (171.7 kg)  10/04/16 (!) 360 lb (163.3 kg)    Physical Exam  Constitutional: He is oriented to person, place, and time. He appears well-developed and well-nourished. No distress.  Tired and mildly ill appearing, comfortable, cooperative, morbidly obese  HENT:  Head: Normocephalic and atraumatic.  Mouth/Throat: Oropharynx is clear  and moist.  Left > R frontal sinus tenderness, minimal maxillary tender. Nares patent with some deeper turbinate edema without purulence or edema. Bilateral TMs clear but with notable clear effusion and bulging on L TM compared to R, no erythema. Oropharynx clear without erythema, exudates, edema or asymmetry.  Eyes: Conjunctivae are normal. Right eye exhibits no discharge. Left eye exhibits no discharge.  Neck: Normal range of motion. Neck supple. No thyromegaly present.  Cardiovascular: Normal rate, regular rhythm, normal heart sounds and intact distal pulses.   No murmur heard. Pulmonary/Chest: Effort normal and breath sounds normal. No respiratory distress. He has no wheezes. He has no rales.  Diminished breath sounds due to large body habitus. Occasional coughing.  Musculoskeletal: He exhibits no edema.  Lymphadenopathy:    He has no cervical adenopathy.  Neurological: He is alert and oriented to person, place, and time.  Skin: Skin is warm and dry. No rash noted. He is not diaphoretic. No erythema.  Nursing note and vitals reviewed.  Results for orders placed or performed in visit on 10/15/16  Hemoglobin A1c  Result Value Ref Range   Hgb A1c MFr Bld 6.3 (H) <5.7 % of total Hgb   Mean Plasma Glucose 134 (calc)   eAG (mmol/L) 7.4 (calc)  BASIC METABOLIC PANEL WITH GFR  Result Value Ref Range  Glucose, Bld 74 65 - 99 mg/dL   BUN 26 (H) 7 - 25 mg/dL   Creat 2.51 (H) 0.60 - 1.35 mg/dL   GFR, Est Non African American 29 (L) > OR = 60 mL/min/1.26m2   GFR, Est African American 34 (L) > OR = 60 mL/min/1.40m2   BUN/Creatinine Ratio 10 6 - 22 (calc)   Sodium 133 (L) 135 - 146 mmol/L   Potassium 3.5 3.5 - 5.3 mmol/L   Chloride 86 (L) 98 - 110 mmol/L   CO2 39 (H) 20 - 32 mmol/L   Calcium 8.6 8.6 - 10.3 mg/dL      Assessment & Plan:   Problem List Items Addressed This Visit    None    Visit Diagnoses    Subacute frontal sinusitis    -  Primary  Consistent with acute-subacute  frontal L>R sinusitis, likely initially viral URI vs allergic rhinitis component with worsening concern for bacterial infection duration >2-3 weeks  Plan: 1. Start Augmentin 875-125mg  PO BID x 10 days 2. Resume Loratadine (Claritin) 10mg  daily and Flonase 2 sprays in each nostril daily for next 4-6 weeks, then may stop and use seasonally or as needed 3. Supportive care with nasal saline OTC, hydration, warm compresses 4. Return criteria reviewed    Relevant Medications   amoxicillin-clavulanate (AUGMENTIN) 875-125 MG tablet      #Erectile Dysfunction - Recommend that he review this with his cardiologist first, due to his heart disease CHF, and if considered appropriate, we could consider rx this at future visit likely Sildenafil generic PRN use. He has tolerated PDE5 in past years ago.  Meds ordered this encounter  Medications  . amoxicillin-clavulanate (AUGMENTIN) 875-125 MG tablet    Sig: Take 1 tablet by mouth 2 (two) times daily. For 10 days    Dispense:  20 tablet    Refill:  0      Follow up plan: Return in about 2 weeks (around 11/25/2016), or if symptoms worsen or fail to improve, for sinusitis.  [name], [facility] 11/11/2016, 8:26 AM

## 2016-11-11 NOTE — Patient Instructions (Addendum)
Thank you for coming to the clinic today.  1.  It sounds like you have a Sinusitis (Bacterial Infection) - this most likely started as an Upper Respiratory Virus that has settled into an infection. Allergies can also cause this. - Start Augmentin 1 pill twice daily (breakfast and dinner, with food and plenty of water) for 10 days, complete entire course, do not stop early even if feeling better - Resume Loratadine (Claritin) 10mg  daily and Flonase 2 sprays in each nostril daily for next 4-6 weeks, then you may stop and use seasonally or as needed - Recommend to start using Nasal Saline spray multiple times a day to help flush out congestion and clear sinuses - Improve hydration by drinking plenty of clear fluids (water, gatorade) to reduce secretions and thin congestion - Congestion draining down throat can cause irritation. May try warm herbal tea with honey, cough drops - Can take Tylenol or Ibuprofen as needed for fevers  If you develop persistent fever >101F for at least 3 consecutive days, headaches with sinus pain or pressure or persistent earache, please schedule a follow-up evaluation within next few days to week.   Please schedule a Follow-up Appointment to: Return in about 2 weeks (around 11/25/2016), or if symptoms worsen or fail to improve, for sinusitis.  If you have any other questions or concerns, please feel free to call the clinic or send a message through [place]. You may also schedule an earlier appointment if necessary.  Additionally, you may be receiving a survey about your experience at our clinic within a few days to 1 week by e-mail or mail. We value your feedback.  [name], DO [place]

## 2016-11-13 ENCOUNTER — Other Ambulatory Visit: Payer: Self-pay

## 2016-11-13 DIAGNOSIS — E782 Mixed hyperlipidemia: Secondary | ICD-10-CM

## 2016-11-13 MED ORDER — ATORVASTATIN CALCIUM 20 MG PO TABS: 20.0000 mg | ORAL_TABLET | Freq: Every day | ORAL | 0 refills | Status: DC

## 2016-12-04 ENCOUNTER — Telehealth: Payer: Self-pay | Admitting: Family Medicine

## 2016-12-04 NOTE — Telephone Encounter (Signed)
Pt stated that he needs our office to call [facility] to get his last refill on traMADol (ULTRAM) 50 MG tablet. Pt stated he didn't understand b/c he has a refill but they advised him they need authorization from our office. Pt stated the last time he got the medication was September 2018. Thanks TNP

## 2016-12-04 NOTE — Telephone Encounter (Signed)
Faxed prior approval to [facility] Track.

## 2016-12-16 NOTE — Telephone Encounter (Signed)
Forms received from [facility] Tracks to get approval for PA for Tramadol, original rx was 30 pills with 1 refill, he was unable to get the refill, through [facility] it seems to have been listed as 180 pills, for 12/06/16 through 06/04/17. This was denied.  Nisha contacted [employer] to review, and considered lower pill #, however patient still was only rx #30 with 1 refill, and pharmacist was able to get it approved instead of 8 day supply they were able to do 30 pills for 7 day supply and it was approved.  [name], DO [facility] 12/16/2016, 5:18 PM

## 2017-01-12 ENCOUNTER — Ambulatory Visit: Payer: Medicaid Other | Admitting: Podiatry

## 2017-01-15 ENCOUNTER — Encounter: Payer: Medicaid Other | Admitting: Podiatry

## 2017-01-20 ENCOUNTER — Other Ambulatory Visit: Payer: Self-pay | Admitting: Family Medicine

## 2017-01-20 DIAGNOSIS — I10 Essential (primary) hypertension: Secondary | ICD-10-CM

## 2017-01-21 ENCOUNTER — Ambulatory Visit: Payer: Medicaid Other | Admitting: Family Medicine

## 2017-01-21 ENCOUNTER — Encounter: Payer: Self-pay | Admitting: Family Medicine

## 2017-01-21 VITALS — BP 110/80 | HR 89 | Resp 16 | Ht 69.0 in | Wt 352.5 lb

## 2017-01-21 DIAGNOSIS — E785 Hyperlipidemia, unspecified: Secondary | ICD-10-CM | POA: Diagnosis not present

## 2017-01-21 DIAGNOSIS — G894 Chronic pain syndrome: Secondary | ICD-10-CM | POA: Diagnosis not present

## 2017-01-21 DIAGNOSIS — M150 Primary generalized (osteo)arthritis: Secondary | ICD-10-CM

## 2017-01-21 DIAGNOSIS — E1169 Type 2 diabetes mellitus with other specified complication: Secondary | ICD-10-CM | POA: Diagnosis not present

## 2017-01-21 DIAGNOSIS — E1122 Type 2 diabetes mellitus with diabetic chronic kidney disease: Secondary | ICD-10-CM | POA: Diagnosis not present

## 2017-01-21 DIAGNOSIS — I5022 Chronic systolic (congestive) heart failure: Secondary | ICD-10-CM | POA: Diagnosis not present

## 2017-01-21 DIAGNOSIS — K219 Gastro-esophageal reflux disease without esophagitis: Secondary | ICD-10-CM | POA: Diagnosis not present

## 2017-01-21 DIAGNOSIS — Z6841 Body Mass Index (BMI) 40.0 and over, adult: Secondary | ICD-10-CM

## 2017-01-21 DIAGNOSIS — M159 Polyosteoarthritis, unspecified: Secondary | ICD-10-CM

## 2017-01-21 DIAGNOSIS — Z794 Long term (current) use of insulin: Secondary | ICD-10-CM

## 2017-01-21 DIAGNOSIS — N184 Chronic kidney disease, stage 4 (severe): Secondary | ICD-10-CM | POA: Diagnosis not present

## 2017-01-21 DIAGNOSIS — I420 Dilated cardiomyopathy: Secondary | ICD-10-CM

## 2017-01-21 LAB — POCT GLYCOSYLATED HEMOGLOBIN (HGB A1C): HEMOGLOBIN A1C: 6.9

## 2017-01-21 MED ORDER — ATORVASTATIN CALCIUM 20 MG PO TABS: 20.0000 mg | ORAL_TABLET | Freq: Every day | ORAL | 3 refills | Status: DC

## 2017-01-21 MED ORDER — TRAMADOL HCL 50 MG PO TABS: 50.0000 mg | ORAL_TABLET | Freq: Two times a day (BID) | ORAL | 2 refills | Status: DC | PRN

## 2017-01-21 MED ORDER — OMEPRAZOLE 40 MG PO CPDR: 40.0000 mg | DELAYED_RELEASE_CAPSULE | Freq: Every day | ORAL | 3 refills | Status: DC

## 2017-01-21 NOTE — Assessment & Plan Note (Signed)
Well-controlled DM with A1c 6.9 (elevated from prior 6.3, still overall controlled compared to prior) Complications - CKD-IV, DM retinopathy Failed Bydureon (unable to operate pen), Trulicity (adherence)  Plan:  1. Continue current therapy - Lantus 20u daily 2. Encourage improved lifestyle - low carb, low sugar diet, reduce portion size, continue improving regular exercise 3. Check CBG, bring log to next visit for review 4. Continue ASA, ACEi, Statin 5. UTD DM Eye [facility] - retinopathy 6. Follow-up 4 months DM A1c

## 2017-01-21 NOTE — Assessment & Plan Note (Addendum)
Gradual weight loss, now down 10 more lbs in 2-3 months Improved diet mostly, still limited mobility due to pain but trying to improve Goal weight loss 1 lb weekly to continue current wt loss trend, encourage work on improving physical activity up to 30 min daily 4-5 days week

## 2017-01-21 NOTE — Telephone Encounter (Signed)
Pt currently in the office.

## 2017-01-21 NOTE — Assessment & Plan Note (Addendum)
Stable, currently well compensated, chronic systolic CHF secondary to non-ischemic cardiomyopathy Followed by [facility] CHF clinic On med management low dose ACEi Lisinopril 2.5mg  (not 30mg  based on record), Carvedilol 50mg  BID (x2 of 25mg  tabs), Bumex 4mg  BID diuresis (no longer on IV diuresis or lasix) Has declined ICD in past

## 2017-01-21 NOTE — Assessment & Plan Note (Signed)
Stable, controlled on daily PPI Refill Omeprazole

## 2017-01-21 NOTE — Assessment & Plan Note (Signed)
Stable CKD-IV Followed by [facility] Dr Juleen China Updated med list, off Allopurinol, has not had gout flare, avoid due to CKD Only on Bumex for diuresis. No furosemide Continue improved hydration On Lisinopril 2.5, not 30 based on record

## 2017-01-21 NOTE — Assessment & Plan Note (Signed)
Stable chronic OA/DJD joint pain, worse with morbid obesity Contraindicated NSAIDs to CKD-IV Updated review of last [place] CSRS for past 2 years, no red flags UDS reviewed from 09/2016 [facility]) = Appropriate, positive expected Tramadol  Plan: 1. Agreed to refill Tramadol today - 50mg  take 1-2 per dose q 12 hr PRN, #30 and +2 refill only use as needed for acute and chronic pains, to avoid nephrotoxic agents. Will plan to update [facility] Controlled Contract in 2019 at next follow-up if still needing long-term

## 2017-01-21 NOTE — Assessment & Plan Note (Signed)
Controlled cholesterol on statin improving lifestyle  Plan: 1. Continue current meds - Atorvastatin 20mg  - refilled 2. Continue ASA 81mg  for primary ASCVD risk reduction 3. Encourage improved lifestyle - low carb/cholesterol, reduce portion size, continue improving regular exercise

## 2017-01-21 NOTE — Assessment & Plan Note (Addendum)
Stable chronic OA/DJD joint pain, worse with morbid obesity Contraindicated NSAIDs to CKD-IV Updated review of last [place] CSRS for past 2 years, no red flags UDS reviewed from 09/2016 [facility]) = Appropriate, positive expected Tramadol  Plan: 1. Agreed to refill Tramadol today - 50mg  take 1-2 per dose q 12 hr PRN, #30 and +2 refill only use as needed for acute and chronic pains, to avoid nephrotoxic agents. Will plan to update [facility] Controlled Contract in 2019 at next follow-up if still needing long-term

## 2017-01-21 NOTE — Assessment & Plan Note (Signed)
Chronic underlying etiology for CHF See A&P For Systolic CHF

## 2017-01-21 NOTE — Patient Instructions (Addendum)
Thank you for coming to the office today.  1. Hemoglobin A1c 6.9, good work overall, continue insulin at this dose  2. May use the generic viagra medicine  3. Refilled Tramadol  Colon Cancer Screening: - For all adults age 50+ routine colon cancer screening is highly recommended.     - Recent guidelines from [place] recommend starting age of 34 - Early detection of colon cancer is important, because often there are no warning signs or symptoms, also if found early usually it can be cured. Late stage is hard to treat.  - If you are not interested in Colonoscopy screening (if done and normal you could be cleared for 5 to 10 years until next due), then Cologuard is an excellent alternative for screening test for Colon Cancer. It is highly sensitive for detecting DNA of colon cancer from even the earliest stages. Also, there is NO bowel prep required. - If Cologuard is NEGATIVE, then it is good for 3 years before next due - If Cologuard is POSITIVE, then it is strongly advised to get a Colonoscopy, which allows the GI doctor to locate the source of the cancer or polyp (even very early stage) and treat it by removing it. ------------------------- If you would like to proceed with Cologuard (stool DNA test) - FIRST, call your insurance company and tell them you want to check cost of Cologuard tell them CPT Code [phone] (it may be completely covered and you could get for no cost, OR max cost without any coverage is about $600). Also, keep in mind if you do NOT open the kit, and decide not to do the test, you will NOT be charged, you should contact the company if you decide not to do the test. - If you want to proceed, you can notify us (phone message, [place], or at next visit) and we will order it for you. The test kit will be delivered to you house within about 1 week. Follow instructions to collect sample, you may call the company for any help or questions, 24/7 telephone support  at [phone].   Please schedule a Follow-up Appointment to: Return in about 4 months (around 05/22/2017) for DM A1c.    If you have any other questions or concerns, please feel free to call the office or send a message through [place]. You may also schedule an earlier appointment if necessary.  Additionally, you may be receiving a survey about your experience at our office within a few days to 1 week by e-mail or mail. We value your feedback.  [name], DO [place] [facility]

## 2017-01-21 NOTE — Progress Notes (Addendum)
Subjective:    Patient ID: [name], male    DOB: [birth year], 51 y.o.   MRN: [id number]  [name] is a 51 y.o. male presenting on 01/21/2017 for Diabetes   HPI   CHRONIC DM, Type 2 controlled with complications Reports no concerns CBGs: No readings today, states improved control Meds: Lantus 20u 5pm Reports good compliance. Tolerating well w/o side-effects Currently on ACEi Lifestyle: - Diet and Exercise (improving but still limited exercise tolerance) - Complication CKD - Last DM Ophtho 07/21/16 with DM retinopathy Denies hypoglycemia, polyuria, visual changes, numbness or tingling.  Chronic Systolic CHF, Severe Non-Ischemic Cardiomyopathy - Followed by [facility] Cardiology CHF. Last seen 12/16/16. Prior ECHO LVEF 20-25% 12/2014 NYHA Class II, he has been treated with IV diuresis in past most recently in Spring 2018. - Currently now on Bumex 4mg  BID. No longer on Furosemide. Cannot take Spironolactone due to HyperK problem in past. - Taking Carvedilol 50mg  BID (x2 of 25mg  tabs), and Lisinopril 2.5mg  daily - He has been offered by Cardiology to have ICD in future, due to risk of sudden cardiac death, per record he has declined this - He has been cleared by Cardiology to take viagra as needed for ED, see below  Chronic Pain Syndrome / Osteoarthritis of multiple joints / History of Gout / Morbid Obesity - Last visit with me 10/15/16 for this same problem, discussed chronic pain limited due to CKD, treated with Tramadol rarely PRN for joint pain, see prior notes for background information. - Interval update with significant improvement on Tramadol PRN - Today patient reports that he rarely takes Tramadol, only uses it if flare, does not take everyday usually takes 2 per dose once to control pain, usually knee or back pain. Sometimes leg pain. No longer taking ibuprofen - He was taken off Allopurinol due to CKD. He has not had any gout flares recently. - Denies any fall or  injury, redness, worsening swelling, other joint pain  Erectile Dysfunction - Reports chronic problem, seems to improve then worsen at times, he thinks that the insulin is related to onset of his symptoms, was prescribed generic viagra in past he has not started taking this.  CKD-IV Followed by [facility] Dr Juleen China. - He was taken off Allopurinol due to CKD. Has not had any recurrences of CKD - Improved hydration water intake now, doing better - Tolerating Tramadol. Not taking Ibuprofen  Health Maintenance: Colon CA Screening: Never had colonoscopy. Currently asymptomatic. No known family history of colon CA. Considering Cologuard, counseling given  Depression screen [facility] 2/9 10/15/2016 05/07/2015 04/09/2015  Decreased Interest 0 0 0  Down, Depressed, Hopeless 0 0 0  PHQ - 2 Score 0 0 0    Social History   Tobacco Use  . Smoking status: Never Smoker  . Smokeless tobacco: Never Used  Substance Use Topics  . Alcohol use: Yes    Comment: a glass of wine and a couple beers a day  . Drug use: No    Review of Systems Per HPI unless specifically indicated above     Objective:    BP 110/80 (BP Location: Left Arm, Patient Position: Sitting, Cuff Size: Large)   Pulse 89   Resp 16   Ht 5\' 9"  (1.753 m)   Wt (!) 352 lb 8 oz (159.9 kg)   SpO2 96%   BMI 52.06 kg/m   Wt Readings from Last 3 Encounters:  01/21/17 (!) 352 lb 8 oz (159.9 kg)  11/11/16 [name])  362 lb (164.2 kg)  10/15/16 (!) 378 lb 9.6 oz (171.7 kg)    Physical Exam  Constitutional: He is oriented to person, place, and time. He appears well-developed and well-nourished. No distress.  Well-appearing, comfortable, cooperative, obese  HENT:  Head: Normocephalic and atraumatic.  Mouth/Throat: Oropharynx is clear and moist.  Eyes: Conjunctivae are normal. Right eye exhibits no discharge. Left eye exhibits no discharge.  Neck: Normal range of motion. Neck supple.  Cardiovascular: Normal rate, regular rhythm, normal heart sounds  and intact distal pulses.  No murmur heard. Pulmonary/Chest: Effort normal and breath sounds normal. No respiratory distress. He has no wheezes. He has no rales.  Musculoskeletal: Normal range of motion. He exhibits no edema.  Lymphadenopathy:    He has no cervical adenopathy.  Neurological: He is alert and oriented to person, place, and time.  Skin: Skin is warm and dry. No rash noted. He is not diaphoretic. No erythema.  Psychiatric: He has a normal mood and affect. His behavior is normal.  Well groomed, good eye contact, normal speech and thoughts  Nursing note and vitals reviewed.  Results for orders placed or performed in visit on 01/21/17  POCT HgB A1C  Result Value Ref Range   Hemoglobin A1C 6.9       Assessment & Plan:   Problem List Items Addressed This Visit    Cardiomyopathy, dilated, nonischemic (HCC)    Chronic underlying etiology for CHF See A&P For Systolic CHF      Relevant Medications   atorvastatin (LIPITOR) 20 MG tablet   lisinopril (PRINIVIL,ZESTRIL) 2.5 MG tablet   Chronic pain syndrome    Stable chronic OA/DJD joint pain, worse with morbid obesity Contraindicated NSAIDs to CKD-IV Updated review of last [place] CSRS for past 2 years, no red flags UDS reviewed from 09/2016 [facility]) = Appropriate, positive expected Tramadol  Plan: 1. Agreed to refill Tramadol today - 50mg  take 1-2 per dose q 12 hr PRN, #30 and +2 refill only use as needed for acute and chronic pains, to avoid nephrotoxic agents. Will plan to update [facility] Controlled Contract in 2019 at next follow-up if still needing long-term      Relevant Medications   traMADol (ULTRAM) 50 MG tablet   Chronic systolic heart failure (HCC)    Stable, currently well compensated, chronic systolic CHF secondary to non-ischemic cardiomyopathy Followed by [facility] Cardiology CHF clinic On med management low dose ACEi Lisinopril 2.5mg  (not 30mg  based on record), Carvedilol 50mg  BID (x2 of 25mg  tabs), Bumex 4mg   BID diuresis (no longer on IV diuresis or lasix) Has declined ICD in past      Relevant Medications   atorvastatin (LIPITOR) 20 MG tablet   lisinopril (PRINIVIL,ZESTRIL) 2.5 MG tablet   CKD (chronic kidney disease), stage IV (HCC)    Stable CKD-IV Followed by [facility] Dr Juleen China Updated med list, off Allopurinol, has not had gout flare, avoid due to CKD Only on Bumex for diuresis. No furosemide Continue improved hydration On Lisinopril 2.5, not 30 based on record      GERD (gastroesophageal reflux disease)    Stable, controlled on daily PPI Refill Omeprazole      Relevant Medications   omeprazole (PRILOSEC) 40 MG capsule   Hyperlipidemia associated with type 2 diabetes mellitus ([place])    Controlled cholesterol on statin improving lifestyle  Plan: 1. Continue current meds - Atorvastatin 20mg  - refilled 2. Continue ASA 81mg  for primary ASCVD risk reduction 3. Encourage improved lifestyle - low carb/cholesterol, reduce portion  size, continue improving regular exercise       Relevant Medications   atorvastatin (LIPITOR) 20 MG tablet   lisinopril (PRINIVIL,ZESTRIL) 2.5 MG tablet   Morbid obesity with BMI of 50.0-59.9, adult (HCC)    Gradual weight loss, now down 10 more lbs in 2-3 months Improved diet mostly, still limited mobility due to pain but trying to improve Goal weight loss 1 lb weekly to continue current wt loss trend, encourage work on improving physical activity up to 30 min daily 4-5 days week      Primary osteoarthritis involving multiple joints    Stable chronic OA/DJD joint pain, worse with morbid obesity Contraindicated NSAIDs to CKD-IV Updated review of last [place] CSRS for past 2 years, no red flags UDS reviewed from 09/2016 [facility]) = Appropriate, positive expected Tramadol  Plan: 1. Agreed to refill Tramadol today - 50mg  take 1-2 per dose q 12 hr PRN, #30 and +2 refill only use as needed for acute and chronic pains, to avoid nephrotoxic agents. Will  plan to update [facility] Controlled Contract in 2019 at next follow-up if still needing long-term      Relevant Medications   traMADol (ULTRAM) 50 MG tablet   Type 2 diabetes mellitus, controlled, with renal complications ([place]) - Primary    Well-controlled DM with A1c 6.9 (elevated from prior 6.3, still overall controlled compared to prior) Complications - CKD-IV, DM retinopathy Failed Bydureon (unable to operate pen), Trulicity (adherence)  Plan:  1. Continue current therapy - Lantus 20u daily 2. Encourage improved lifestyle - low carb, low sugar diet, reduce portion size, continue improving regular exercise 3. Check CBG, bring log to next visit for review 4. Continue ASA, ACEi, Statin 5. UTD DM Eye [facility] - retinopathy 6. Follow-up 4 months DM A1c      Relevant Medications   atorvastatin (LIPITOR) 20 MG tablet   lisinopril (PRINIVIL,ZESTRIL) 2.5 MG tablet   Other Relevant Orders   POCT HgB A1C (Completed)      Meds ordered this encounter  Medications  . traMADol (ULTRAM) 50 MG tablet    Sig: Take 1-2 tablets (50-100 mg total) by mouth every 12 (twelve) hours as needed.    Dispense:  30 tablet    Refill:  2  . omeprazole (PRILOSEC) 40 MG capsule    Sig: Take 1 capsule (40 mg total) by mouth daily.    Dispense:  90 capsule    Refill:  3  . atorvastatin (LIPITOR) 20 MG tablet    Sig: Take 1 tablet (20 mg total) by mouth daily.    Dispense:  90 tablet    Refill:  3    Follow up plan: Return in about 4 months (around 05/22/2017) for DM A1c.  [name], [facility] 01/21/2017, 5:36 PM

## 2017-01-25 NOTE — Progress Notes (Signed)
This encounter was created in error - please disregard.

## 2017-04-16 IMAGING — DX DG CHEST 2V
2 series · 2 of 2 positions shown · non-contrast
Comparison: Chest x-ray of 11/01/2011

CLINICAL DATA: History of CHF, short of breath for 1 month, cough,
former smoking history

EXAM:
CHEST  2 VIEW

[chest lat]
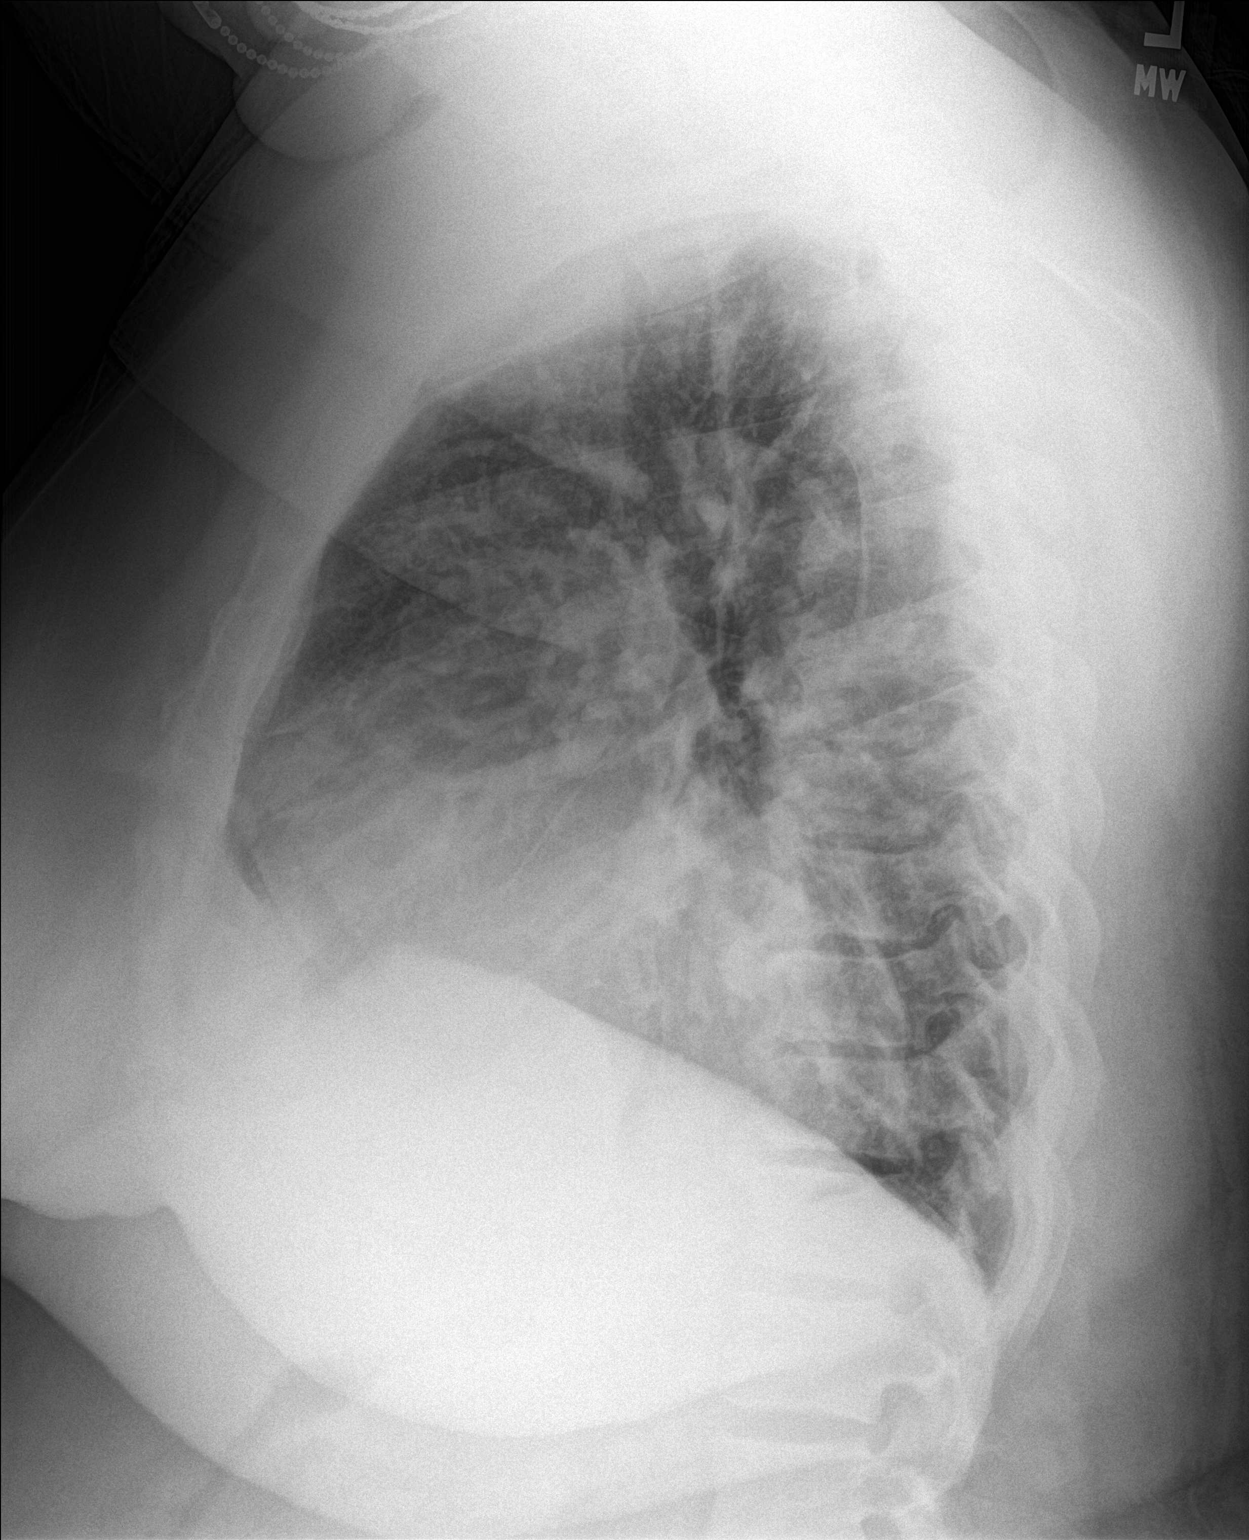

[chest pa]
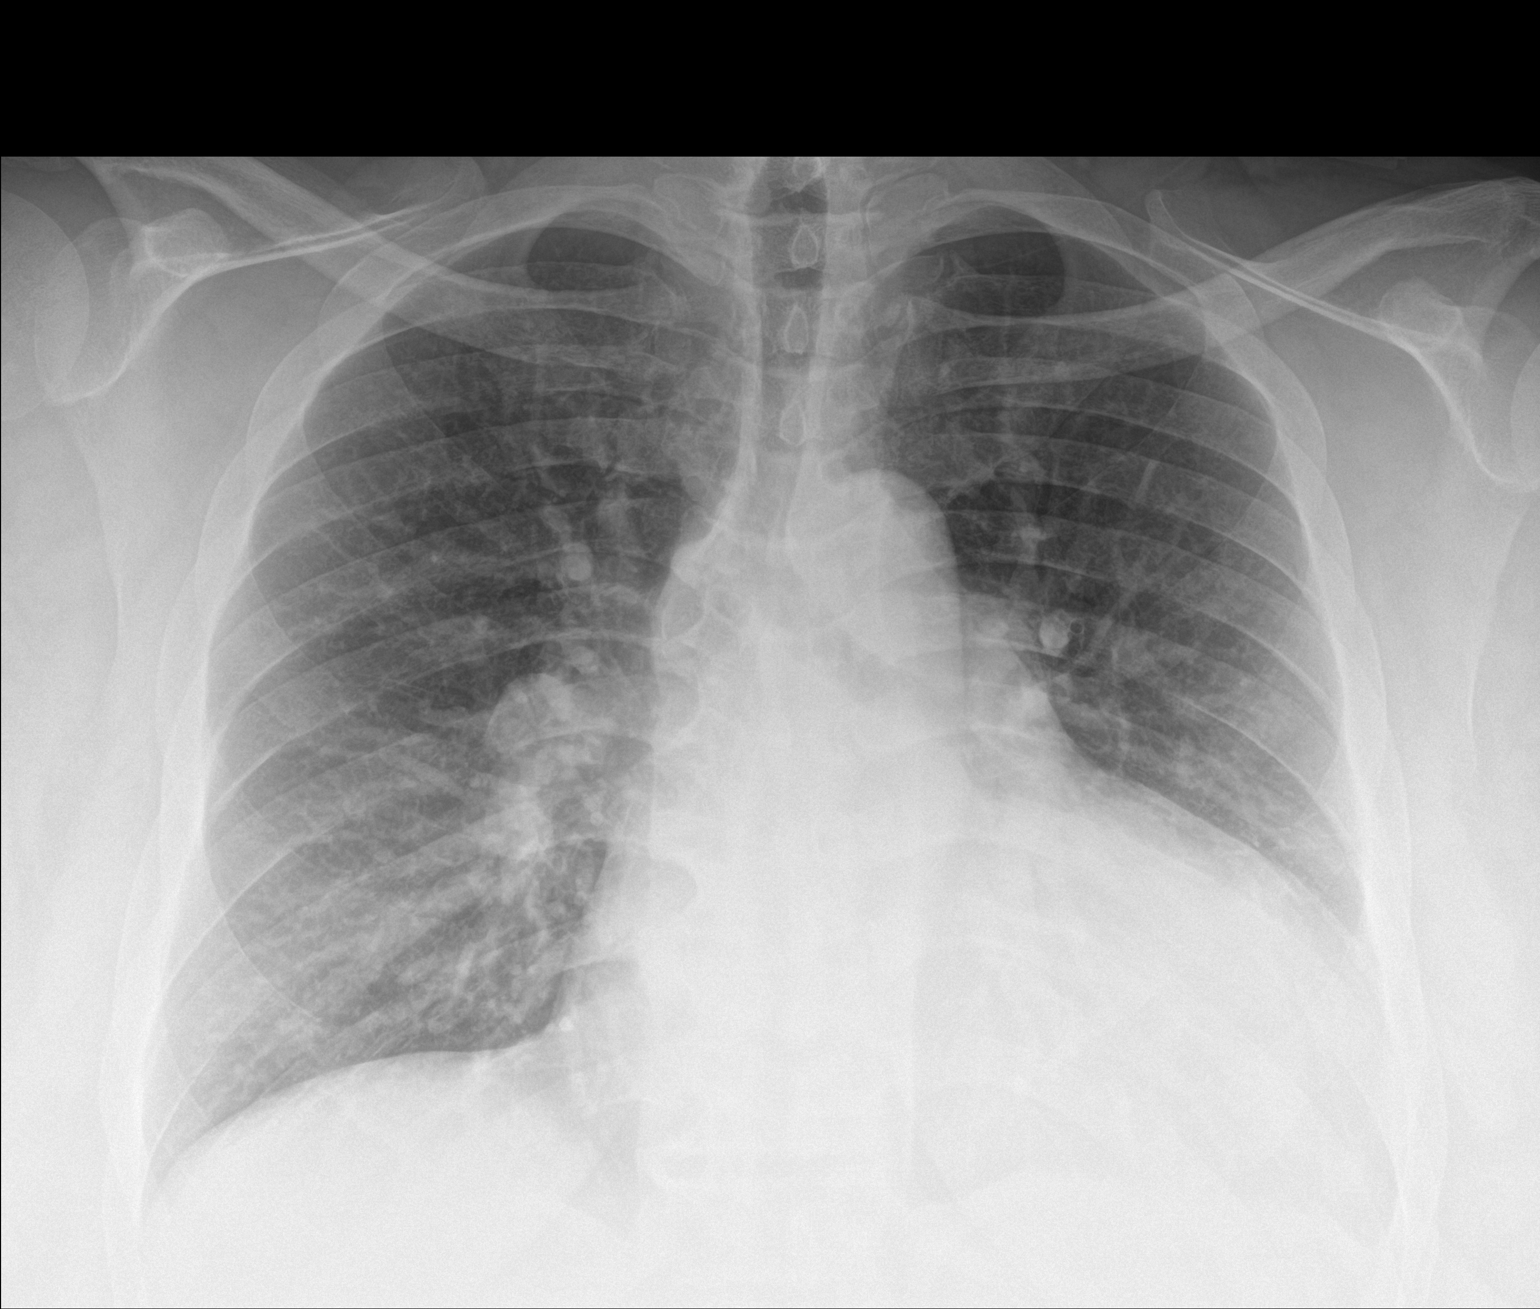

[2 of 2 positions shown; findings below may reference images not displayed]

FINDINGS: No infiltrate or effusion is seen. There is moderate cardiomegaly
present with pulmonary vascular congestion. No definite edema is
noted. There are degenerative changes throughout the mid to lower
thoracic spine.
IMPRESSION: Cardiomegaly and pulmonary vascular congestion. No pleural effusion.

## 2017-05-24 ENCOUNTER — Ambulatory Visit: Payer: Medicaid Other | Admitting: Family Medicine

## 2017-06-06 ENCOUNTER — Other Ambulatory Visit: Payer: Self-pay | Admitting: Family Medicine

## 2017-06-10 ENCOUNTER — Telehealth: Payer: Self-pay | Admitting: Family Medicine

## 2017-06-10 DIAGNOSIS — M10339 Gout due to renal impairment, unspecified wrist: Secondary | ICD-10-CM

## 2017-06-10 DIAGNOSIS — M1A49X0 Other secondary chronic gout, multiple sites, without tophus (tophi): Secondary | ICD-10-CM

## 2017-06-10 MED ORDER — PREDNISONE 10 MG PO TABS: ORAL_TABLET | ORAL | 0 refills | Status: DC

## 2017-06-10 NOTE — Telephone Encounter (Signed)
Acute gout flare in wrist, has CKD-IV, cannot take NSAIDs. He was on allopurinol but then stopped d/t kidney function. Gout was in foot/ankle but then now he has in wrist. Will send rx prednisone taper as this is safest option for him to take due to kidneys, and he needs anti inflammatory, he will follow-up on Monday 5/20, seek help sooner if not improving. Advised him I don't routine phone in this rx but since he needs to take the rx as soon as possible to help pain and swelling, he will follow-up Monday instead in office.  [name], [facility] 06/10/2017, 4:38 PM

## 2017-06-10 NOTE — Telephone Encounter (Signed)
Pt needs something for gout in wrist called to [facility].  His call back number is [phone]

## 2017-06-15 ENCOUNTER — Other Ambulatory Visit: Payer: Self-pay | Admitting: Family Medicine

## 2017-06-15 ENCOUNTER — Ambulatory Visit: Payer: Self-pay | Admitting: Family Medicine

## 2017-06-15 ENCOUNTER — Encounter: Payer: Self-pay | Admitting: Family Medicine

## 2017-06-15 VITALS — BP 130/71 | HR 79 | Temp 98.5°F | Resp 16 | Ht 69.0 in | Wt 354.0 lb

## 2017-06-15 DIAGNOSIS — N184 Chronic kidney disease, stage 4 (severe): Secondary | ICD-10-CM

## 2017-06-15 DIAGNOSIS — E1122 Type 2 diabetes mellitus with diabetic chronic kidney disease: Secondary | ICD-10-CM

## 2017-06-15 DIAGNOSIS — J302 Other seasonal allergic rhinitis: Secondary | ICD-10-CM

## 2017-06-15 DIAGNOSIS — I420 Dilated cardiomyopathy: Secondary | ICD-10-CM

## 2017-06-15 DIAGNOSIS — Z794 Long term (current) use of insulin: Secondary | ICD-10-CM

## 2017-06-15 DIAGNOSIS — G894 Chronic pain syndrome: Secondary | ICD-10-CM

## 2017-06-15 DIAGNOSIS — I5022 Chronic systolic (congestive) heart failure: Secondary | ICD-10-CM

## 2017-06-15 DIAGNOSIS — M150 Primary generalized (osteo)arthritis: Secondary | ICD-10-CM

## 2017-06-15 DIAGNOSIS — Z6841 Body Mass Index (BMI) 40.0 and over, adult: Secondary | ICD-10-CM

## 2017-06-15 DIAGNOSIS — M1A39X0 Chronic gout due to renal impairment, multiple sites, without tophus (tophi): Secondary | ICD-10-CM

## 2017-06-15 DIAGNOSIS — Z1211 Encounter for screening for malignant neoplasm of colon: Secondary | ICD-10-CM

## 2017-06-15 DIAGNOSIS — E785 Hyperlipidemia, unspecified: Secondary | ICD-10-CM

## 2017-06-15 DIAGNOSIS — Z125 Encounter for screening for malignant neoplasm of prostate: Secondary | ICD-10-CM

## 2017-06-15 DIAGNOSIS — E1169 Type 2 diabetes mellitus with other specified complication: Secondary | ICD-10-CM

## 2017-06-15 DIAGNOSIS — K219 Gastro-esophageal reflux disease without esophagitis: Secondary | ICD-10-CM

## 2017-06-15 DIAGNOSIS — Z0000 Encounter for general adult medical examination without abnormal findings: Secondary | ICD-10-CM

## 2017-06-15 DIAGNOSIS — I10 Essential (primary) hypertension: Secondary | ICD-10-CM

## 2017-06-15 DIAGNOSIS — M159 Polyosteoarthritis, unspecified: Secondary | ICD-10-CM

## 2017-06-15 LAB — POCT GLYCOSYLATED HEMOGLOBIN (HGB A1C): Hemoglobin A1C: 7 % — AB (ref 4.0–5.6)

## 2017-06-15 MED ORDER — RANITIDINE HCL 150 MG PO TABS: 150.0000 mg | ORAL_TABLET | Freq: Every day | ORAL | 5 refills | Status: DC

## 2017-06-15 MED ORDER — CARVEDILOL 25 MG PO TABS: 50.0000 mg | ORAL_TABLET | Freq: Two times a day (BID) | ORAL | 11 refills | Status: DC

## 2017-06-15 MED ORDER — CARVEDILOL 25 MG PO TABS: 25.0000 mg | ORAL_TABLET | Freq: Two times a day (BID) | ORAL | 11 refills | Status: DC

## 2017-06-15 MED ORDER — LISINOPRIL 2.5 MG PO TABS: 2.5000 mg | ORAL_TABLET | Freq: Every day | ORAL | 3 refills | Status: DC

## 2017-06-15 MED ORDER — OMEPRAZOLE 40 MG PO CPDR: 40.0000 mg | DELAYED_RELEASE_CAPSULE | Freq: Every day | ORAL | 3 refills | Status: DC

## 2017-06-15 MED ORDER — TRAMADOL HCL 50 MG PO TABS: 50.0000 mg | ORAL_TABLET | Freq: Two times a day (BID) | ORAL | 2 refills | Status: DC | PRN

## 2017-06-15 MED ORDER — LORATADINE 10 MG PO TABS: 10.0000 mg | ORAL_TABLET | Freq: Every day | ORAL | 3 refills | Status: DC

## 2017-06-15 MED ORDER — INSULIN GLARGINE 100 UNIT/ML SOLOSTAR PEN: 20.0000 [IU] | PEN_INJECTOR | Freq: Every day | SUBCUTANEOUS | 3 refills | Status: DC

## 2017-06-15 NOTE — Assessment & Plan Note (Addendum)
Stable CKD-IV Followed by [facility] Dr Juleen China Remains off Allopurinol - now acute flare recently gout - Recommend return to Dr Juleen China at [facility] to review CKD and gout recommendations, may consider switch to Uloric (Febuxostat) 40mg  renally dosed as next alternative for gout prevention Remain off Lisinopril 2.5 per Cardiology 04/2017 - initially refilled per patient but then note said otherwise, I called his pharmacy and discontinued med Only on Bumex for diuresis. No furosemide Continue improved hydration

## 2017-06-15 NOTE — Progress Notes (Signed)
Subjective:    Patient ID: [name], male    DOB: 26-Aug-1965, 52 y.o.   MRN: [id number]  [name] is a 52 y.o. male presenting on 06/15/2017 for Diabetes (ranges 120)   HPI  CHRONIC DM, Type 2 controlled with complications Reports no concerns. Recently had prednisone burst for acute gout flare. CBGs: Avg 120 to 150. Low 80 Meds: Lantus 20u in AM daily Reports good compliance. Tolerating well w/o side-effects Currently on ACEi Lifestyle: - Diet and Exercise (improving but still limited exercise tolerance) - Complication CKD - Last DM Ophtho 07/21/16 with DM retinopathy Admits episodes hypoglycemia only if not eating or skips meals, approx 70-80 he has symptoms and feels lightheaded and near syncope, but this does not happen if he stays hydrated and eats normal meals Denies polyuria, visual changes, numbness or tingling.  Chronic Systolic CHF, Severe Non-Ischemic Cardiomyopathy - Followed by [facility] Cardiology CHF. Last seen 04/2017. Prior ECHO LVEF 20-25% 12/2014 - then significant improvement to LVEF 50% by 2019, NYHA Class II, he has had aggressive diuresis in past including IV, limited by AoCKD. He has been managed well more recently on current diuretic dose of Bumex 4mg  twice daily, and also carvedilol 25mg  BID per cardiology, they also HELD Lisinopril 2.5mg  at last visit. - No longer on Furosemide. Cannot take Spironolactone due to HyperK problem in past. - He has been offered by Cardiology to have ICD in future, due to risk of sudden cardiac death, per record he has declined this - He has been cleared by Cardiology to take viagra as needed for ED, see below  Chronic Pain Syndrome / Osteoarthritis of multiple joints / History of Gout / Morbid Obesity Chronic pain limited due to CKD, treated with Tramadol rarely PRN for joint pain, see prior notes for background information. - Interval update with significant improvement on Tramadol PRN - Today patient reports that he  rarely takes Tramadol, only uses it if flare, does not take everyday usually takes 2 per dose once to control pain, usually knee or back pain - He was taken off Allopurinol due to CKD, he recently had acute gout flare of Left hand/wrist - he normally takes 1 dose of ibuprofen at acute onset which resolves it, however this time it did not, and he called our office last week and was given Prednisone taper - He has not returned to Dr Juleen China - [facility] nephrology - Denies any fall or injury, redness, worsening swelling, other joint pain  CKD-IV Followed by [facility] Dr Juleen China. - He was taken off Allopurinol due to CKD - Improved hydration water intake now, doing better - he wears compression stockings  Health Maintenance: Colon CA Screening: Never had colonoscopy. Currently asymptomatic. No known family history of colon CA. Considering Cologuard, counseling given - he requests to order this today    Depression screen [facility] 2/9 06/15/2017 10/15/2016 05/07/2015  Decreased Interest 0 0 0  Down, Depressed, Hopeless 0 0 0  PHQ - 2 Score 0 0 0    Social History   Tobacco Use  . Smoking status: Never Smoker  . Smokeless tobacco: Never Used  Substance Use Topics  . Alcohol use: Yes    Comment: a glass of wine and a couple beers a day  . Drug use: No    Review of Systems Per HPI unless specifically indicated above     Objective:    BP 130/71   Pulse 79   Temp 98.5 F (36.9 C) (Oral)  Resp 16   Ht 5\' 9"  (1.753 m)   Wt (!) 354 lb (160.6 kg)   BMI 52.28 kg/m   Wt Readings from Last 3 Encounters:  06/15/17 (!) 354 lb (160.6 kg)  01/21/17 (!) 352 lb 8 oz (159.9 kg)  11/11/16 (!) 362 lb (164.2 kg)    Physical Exam  Constitutional: He is oriented to person, place, and time. He appears well-developed and well-nourished. No distress.  Well-appearing, comfortable, cooperative, morbidly obese  HENT:  Head: Normocephalic and atraumatic.  Mouth/Throat: Oropharynx is clear and moist.  Eyes:  Conjunctivae are normal. Right eye exhibits no discharge. Left eye exhibits no discharge.  Neck: Normal range of motion. Neck supple. No thyromegaly present.  Cardiovascular: Normal rate, regular rhythm, normal heart sounds and intact distal pulses.  No murmur heard. Pulmonary/Chest: Effort normal and breath sounds normal. No respiratory distress. He has no wheezes. He has no rales.  Musculoskeletal: Normal range of motion. He exhibits edema (mild trace to +1 edema bilateral lower extremity symmetrical, improved with compression stockings).  Residual swelling of left hand/wrist, seems improved, no erythema, non tender, intact ROM  Lymphadenopathy:    He has no cervical adenopathy.  Neurological: He is alert and oriented to person, place, and time.  Skin: Skin is warm and dry. No rash noted. He is not diaphoretic. No erythema.  Psychiatric: He has a normal mood and affect. His behavior is normal.  Well groomed, good eye contact, normal speech and thoughts  Nursing note and vitals reviewed.    Diabetic Foot Exam - Simple   Simple Foot Form Diabetic Foot exam was performed with the following findings:  Yes 06/15/2017  8:31 AM  Visual Inspection No deformities, no ulcerations, no other skin breakdown bilaterally:  Yes Sensation Testing Intact to touch and monofilament testing bilaterally:  Yes Pulse Check Posterior Tibialis and Dorsalis pulse intact bilaterally:  Yes Comments    Recent Labs    10/15/16 0910 01/21/17 0823 06/15/17 0835  HGBA1C 6.3* 6.9 7.0*    Results for orders placed or performed in visit on 06/15/17  POCT HgB A1C  Result Value Ref Range   Hemoglobin A1C 7.0 (A) 4.0 - 5.6 %   HbA1c, POC (prediabetic range)  5.7 - 6.4 %   HbA1c, POC (controlled diabetic range)  0.0 - 7.0 %      Assessment & Plan:   Problem List Items Addressed This Visit    Benign hypertension    Controlled HTN - Home BP readings normal, infrequent checks  Complication with CKD-IV,  Systolic CHF OFF Lisinopril    Plan:  1. Continue current BP regimen - Carvedilol 25mg  BID and on diuretic Bumex 4mg  BID - Note initially he requested rx carvedilol 25 x 2 tabs BID and Lisinopril 2.5mg  - but confirmed w/ cardiology note 04/2017 that he is on 25 BID and OFF Lisinopril 2. Encourage improved lifestyle - low sodium diet, regular exercise 3. Start monitor BP outside office, bring readings to next visit, if persistently >140/90 or new symptoms notify office sooner 4. Follow-up 4 months annual + labs, f/u with Cardiology and return to Nephrology      Relevant Medications   carvedilol (COREG) 25 MG tablet   Cardiomyopathy, dilated, nonischemic (HCC)    Chronic underlying etiology for CHF See A&P For Systolic CHF      Relevant Medications   carvedilol (COREG) 25 MG tablet   Chronic pain syndrome    Stable chronic OA/DJD joint pain, worse with morbid obesity  Contraindicated NSAIDs to CKD-IV Updated review of last [place] CSRS (06/15/17) for past 2 years, no red flags UDS reviewed from 09/2016 [facility]) = Appropriate, positive expected Tramadol  Plan: 1. Agreed to refill Tramadol today - 50mg  take 1-2 per dose q 12 hr PRN, #30 and +2 refill only use as needed for acute and chronic pains, to avoid nephrotoxic agents - Will plan to update [facility] Controlled Contract in 09/2017 at next follow-up if still needing long-term      Relevant Medications   traMADol (ULTRAM) 50 MG tablet   Chronic systolic heart failure (HCC)   Relevant Medications   carvedilol (COREG) 25 MG tablet   CKD (chronic kidney disease), stage IV (HCC)    Stable CKD-IV Followed by [facility] Dr Juleen China Remains off Allopurinol - now acute flare recently gout - Recommend return to Dr Juleen China at [facility] to review CKD and gout recommendations, may consider switch to Uloric (Febuxostat) 40mg  renally dosed as next alternative for gout prevention Remain off Lisinopril 2.5 per Cardiology 04/2017 - initially refilled per  patient but then note said otherwise, I called his pharmacy and discontinued med Only on Bumex for diuresis. No furosemide Continue improved hydration      GERD (gastroesophageal reflux disease)    Stable, controlled on daily PPI Refill Omeprazole      Relevant Medications   magnesium oxide (MAG-OX) 400 MG tablet   omeprazole (PRILOSEC) 40 MG capsule   ranitidine (ZANTAC) 150 MG tablet   Gout    Recent L wrist acute gout flare, resolving s/p prednisone burst/taper Previously rare gout flares in past several years Now off Allopurinol due to CKD-IV per nephrology Advised against taking NSAID PRN with possible early gout flares Recommend return to Dr Juleen China at [facility] to review CKD and gout recommendations, may consider switch to Uloric (Febuxostat) 40mg  renally dosed as next alternative for gout prevention  Check upcoming labs w/ Uric Acid at annual 4 months      Morbid obesity with BMI of 50.0-59.9, adult (HCC)    Stable weight vs gain 2 lbs in 6 months Recent poor lifestyle he had lost more weight before, but now less successful Goal weight loss 1 lb weekly, continue improving and resume diet and exercise      Relevant Medications   magnesium oxide (MAG-OX) 400 MG tablet   Insulin Glargine (LANTUS SOLOSTAR) 100 UNIT/ML Solostar Pen   Primary osteoarthritis involving multiple joints    Stable chronic OA/DJD joint pain, worse with morbid obesity Contraindicated NSAIDs to CKD-IV Updated review of last [place] CSRS (06/15/17) for past 2 years, no red flags UDS reviewed from 09/2016 ([place]) = Appropriate, positive expected Tramadol  Plan: 1. Agreed to refill Tramadol today - 50mg  take 1-2 per dose q 12 hr PRN, #30 and +2 refill only use as needed for acute and chronic pains, to avoid nephrotoxic agents. - Will plan to update [facility] Controlled Contract in 09/2017 in future if still needing long-term      Relevant Medications   traMADol (ULTRAM) 50 MG tablet   Type 2 diabetes  mellitus, controlled, with renal complications ([place]) - Primary    Well-controlled DM with A1c 7.0 similar to last 6.9 (elevated from prior 6.3, still overall controlled compared to prior) Complications - CKD-IV, DM retinopathy Failed Bydureon (unable to operate pen), Trulicity (adherence)  Plan:  1. Continue current therapy - Lantus 20u daily 2. Encourage improved lifestyle - low carb, low sugar diet, reduce portion size, continue improving regular exercise  3. Check CBG, bring log to next visit for review 4. Continue ASA, ACEi, Statin 5. Will be due soon for DM Eye [facility] - retinopathy - call to schedule - DM Foot Exam updated 6. Follow-up 4 months Annual Phys + A1c labs      Relevant Medications   Insulin Glargine (LANTUS SOLOSTAR) 100 UNIT/ML Solostar Pen   Other Relevant Orders   POCT HgB A1C (Completed)    Other Visit Diagnoses    Seasonal allergies       Relevant Medications   loratadine (CLARITIN) 10 MG tablet   Screening for colon cancer      Due for routine colon cancer screening. Never had colonoscopy (not interested), no family history colon cancer. - Discussion today about recommendations for either Colonoscopy or Cologuard screening, benefits and risks of screening, interested in Cologuard, understands that if positive then recommendation is for diagnostic colonoscopy to follow-up. - Ordered Cologuard today    Relevant Orders   Cologuard      Meds ordered this encounter  Medications  . DISCONTD: carvedilol (COREG) 25 MG tablet    Sig: Take 2 tablets (50 mg total) by mouth 2 (two) times daily with a meal.    Dispense:  120 tablet    Refill:  11  . DISCONTD: lisinopril (PRINIVIL,ZESTRIL) 2.5 MG tablet    Sig: Take 1 tablet (2.5 mg total) by mouth daily.    Dispense:  90 tablet    Refill:  3  . loratadine (CLARITIN) 10 MG tablet    Sig: Take 1 tablet (10 mg total) by mouth daily.    Dispense:  90 tablet    Refill:  3  . omeprazole (PRILOSEC) 40 MG  capsule    Sig: Take 1 capsule (40 mg total) by mouth daily.    Dispense:  90 capsule    Refill:  3  . ranitidine (ZANTAC) 150 MG tablet    Sig: Take 1 tablet (150 mg total) by mouth daily before breakfast.    Dispense:  30 tablet    Refill:  5  . traMADol (ULTRAM) 50 MG tablet    Sig: Take 1-2 tablets (50-100 mg total) by mouth every 12 (twelve) hours as needed.    Dispense:  30 tablet    Refill:  2  . Insulin Glargine (LANTUS SOLOSTAR) 100 UNIT/ML Solostar Pen    Sig: Inject 20 Units into the skin daily.    Dispense:  15 mL    Refill:  3  . carvedilol (COREG) 25 MG tablet    Sig: Take 1 tablet (25 mg total) by mouth 2 (two) times daily with a meal.    Dispense:  60 tablet    Refill:  11    Follow up plan: Return in about 4 months (around 10/16/2017) for Annual Physical.  Future labs ordered for 10/13/17  [name], [facility] 06/15/2017, 12:48 PM

## 2017-06-15 NOTE — Assessment & Plan Note (Signed)
Well-controlled DM with A1c 7.0 similar to last 6.9 (elevated from prior 6.3, still overall controlled compared to prior) Complications - CKD-IV, DM retinopathy Failed Bydureon (unable to operate pen), Trulicity (adherence)  Plan:  1. Continue current therapy - Lantus 20u daily 2. Encourage improved lifestyle - low carb, low sugar diet, reduce portion size, continue improving regular exercise 3. Check CBG, bring log to next visit for review 4. Continue ASA, ACEi, Statin 5. Will be due soon for DM Eye [facility] - retinopathy - call to schedule - DM Foot Exam updated 6. Follow-up 4 months Annual Phys + A1c labs

## 2017-06-15 NOTE — Assessment & Plan Note (Signed)
Stable chronic OA/DJD joint pain, worse with morbid obesity Contraindicated NSAIDs to CKD-IV Updated review of last [place] CSRS (06/15/17) for past 2 years, no red flags UDS reviewed from 09/2016 [facility]) = Appropriate, positive expected Tramadol  Plan: 1. Agreed to refill Tramadol today - 50mg  take 1-2 per dose q 12 hr PRN, #30 and +2 refill only use as needed for acute and chronic pains, to avoid nephrotoxic agents - Will plan to update [facility] Controlled Contract in 09/2017 at next follow-up if still needing long-term

## 2017-06-15 NOTE — Patient Instructions (Addendum)
Thank you for coming to the office today.  A1c 7.0 keep up good work  Call their office to check status of next appointment to follow-up chronic kidney disease - and specifically discuss gout and if you can take any of the preventative medicines - I would recommend asking if it is safe to take Febuxostat [facility]) for gout  Lavonia Dana, MD  [place] [facility]) [address] [place], [place] [address] Phone: [phone]   --------------------  Call Eye Doctor for apt in June 2019 - we need copy of DM Eye Exam by fax  [name] Address: [place], [place], [place] [address]  Phone: [phone]  -----------------------------------  Colon Cancer Screening: - For all adults age 54+ routine colon cancer screening is highly recommended.     - Recent guidelines from [place] recommend starting age of 15 - Early detection of colon cancer is important, because often there are no warning signs or symptoms, also if found early usually it can be cured. Late stage is hard to treat.  - If you are not interested in Colonoscopy screening (if done and normal you could be cleared for 5 to 10 years until next due), then Cologuard is an excellent alternative for screening test for Colon Cancer. It is highly sensitive for detecting DNA of colon cancer from even the earliest stages. Also, there is NO bowel prep required. - If Cologuard is NEGATIVE, then it is good for 3 years before next due - If Cologuard is POSITIVE, then it is strongly advised to get a Colonoscopy, which allows the GI doctor to locate the source of the cancer or polyp (even very early stage) and treat it by removing it.    Follow instructions to collect sample, you may call the company for any help or questions, 24/7 telephone support at [phone].   Please schedule a Follow-up Appointment to: Return in about 4 months (around 10/16/2017) for Annual Physical.  If  you have any other questions or concerns, please feel free to call the office or send a message through [place]. You may also schedule an earlier appointment if necessary.  Additionally, you may be receiving a survey about your experience at our office within a few days to 1 week by e-mail or mail. We value your feedback.  [name], DO [place]

## 2017-06-15 NOTE — Assessment & Plan Note (Addendum)
Recent L wrist acute gout flare, resolving s/p prednisone burst/taper Previously rare gout flares in past several years Now off Allopurinol due to CKD-IV per nephrology Advised against taking NSAID PRN with possible early gout flares Recommend return to Dr Juleen China at [facility] to review CKD and gout recommendations, may consider switch to Uloric (Febuxostat) 40mg  renally dosed as next alternative for gout prevention  Check upcoming labs w/ Uric Acid at annual 4 months

## 2017-06-15 NOTE — Assessment & Plan Note (Signed)
Stable chronic OA/DJD joint pain, worse with morbid obesity Contraindicated NSAIDs to CKD-IV Updated review of last [place] CSRS (06/15/17) for past 2 years, no red flags UDS reviewed from 09/2016 [facility]) = Appropriate, positive expected Tramadol  Plan: 1. Agreed to refill Tramadol today - 50mg  take 1-2 per dose q 12 hr PRN, #30 and +2 refill only use as needed for acute and chronic pains, to avoid nephrotoxic agents. - Will plan to update [facility] Controlled Contract in 09/2017 in future if still needing long-term

## 2017-06-15 NOTE — Assessment & Plan Note (Signed)
Stable, controlled on daily PPI Refill Omeprazole

## 2017-06-15 NOTE — Assessment & Plan Note (Signed)
Stable weight vs gain 2 lbs in 6 months Recent poor lifestyle he had lost more weight before, but now less successful Goal weight loss 1 lb weekly, continue improving and resume diet and exercise

## 2017-06-15 NOTE — Assessment & Plan Note (Signed)
Chronic underlying etiology for CHF See A&P For Systolic CHF

## 2017-06-15 NOTE — Assessment & Plan Note (Addendum)
Controlled HTN - Home BP readings normal, infrequent checks  Complication with CKD-IV, Systolic CHF OFF Lisinopril    Plan:  1. Continue current BP regimen - Carvedilol 25mg  BID and on diuretic Bumex 4mg  BID - Note initially he requested rx carvedilol 25 x 2 tabs BID and Lisinopril 2.5mg  - but confirmed w/ cardiology note 04/2017 that he is on 25 BID and OFF Lisinopril 2. Encourage improved lifestyle - low sodium diet, regular exercise 3. Start monitor BP outside office, bring readings to next visit, if persistently >140/90 or new symptoms notify office sooner 4. Follow-up 4 months annual + labs, f/u with Cardiology and return to Nephrology

## 2017-08-24 ENCOUNTER — Telehealth: Payer: Self-pay | Admitting: Family Medicine

## 2017-08-24 NOTE — Telephone Encounter (Signed)
Patient called about refill Carvedilol - he was taking 25mg  x 2 tabs (50mg ) BID - incorrect dosing, and I checked my note from 05/2017 we advised him to take 25mg  BID after confirming with cardiology note from 04/2017. He was advised again of correct dose by staff and now he agrees to take 25mg  BID. No new rx needed he was able to get it filled, further questions on dosing he will check with Cardiology  [name], [facility] 08/24/2017, 5:16 PM

## 2017-10-13 ENCOUNTER — Other Ambulatory Visit: Payer: Medicaid Other

## 2017-10-13 DIAGNOSIS — E1169 Type 2 diabetes mellitus with other specified complication: Secondary | ICD-10-CM

## 2017-10-13 DIAGNOSIS — E785 Hyperlipidemia, unspecified: Secondary | ICD-10-CM

## 2017-10-13 DIAGNOSIS — Z794 Long term (current) use of insulin: Secondary | ICD-10-CM

## 2017-10-13 DIAGNOSIS — M1A39X0 Chronic gout due to renal impairment, multiple sites, without tophus (tophi): Secondary | ICD-10-CM

## 2017-10-13 DIAGNOSIS — E1122 Type 2 diabetes mellitus with diabetic chronic kidney disease: Secondary | ICD-10-CM

## 2017-10-13 DIAGNOSIS — I420 Dilated cardiomyopathy: Secondary | ICD-10-CM

## 2017-10-13 DIAGNOSIS — I5022 Chronic systolic (congestive) heart failure: Secondary | ICD-10-CM

## 2017-10-13 DIAGNOSIS — I10 Essential (primary) hypertension: Secondary | ICD-10-CM

## 2017-10-13 DIAGNOSIS — Z125 Encounter for screening for malignant neoplasm of prostate: Secondary | ICD-10-CM

## 2017-10-13 DIAGNOSIS — N184 Chronic kidney disease, stage 4 (severe): Secondary | ICD-10-CM

## 2017-10-13 DIAGNOSIS — Z0000 Encounter for general adult medical examination without abnormal findings: Secondary | ICD-10-CM

## 2017-10-13 DIAGNOSIS — Z6841 Body Mass Index (BMI) 40.0 and over, adult: Secondary | ICD-10-CM

## 2017-10-14 LAB — LIPID PANEL
CHOL/HDL RATIO: 2.3 (calc) (ref <5.0)
CHOLESTEROL: 115 mg/dL (ref <200)
HDL: 49 mg/dL (ref >40)
LDL Cholesterol (Calc): 39 mg/dL (calc)
NON-HDL CHOLESTEROL (CALC): 66 mg/dL (ref <130)
Triglycerides: 208 mg/dL — ABNORMAL HIGH (ref <150)

## 2017-10-14 LAB — CBC WITH DIFFERENTIAL/PLATELET
BASOS PCT: 0.8 %
Basophils Absolute: 51 cells/uL (ref 0–200)
Eosinophils Absolute: 173 cells/uL (ref 15–500)
Eosinophils Relative: 2.7 %
HCT: 49.1 % (ref 38.5–50.0)
Hemoglobin: 16.4 g/dL (ref 13.2–17.1)
LYMPHS ABS: 1549 {cells}/uL (ref 850–3900)
MCH: 31.5 pg (ref 27.0–33.0)
MCHC: 33.4 g/dL (ref 32.0–36.0)
MCV: 94.4 fL (ref 80.0–100.0)
MPV: 9.5 fL (ref 7.5–12.5)
Monocytes Relative: 8.3 %
NEUTROS ABS: 4096 {cells}/uL (ref 1500–7800)
Neutrophils Relative %: 64 %
Platelets: 162 10*3/uL (ref 140–400)
RBC: 5.2 10*6/uL (ref 4.20–5.80)
RDW: 13 % (ref 11.0–15.0)
TOTAL LYMPHOCYTE: 24.2 %
WBC mixed population: 531 cells/uL (ref 200–950)
WBC: 6.4 10*3/uL (ref 3.8–10.8)

## 2017-10-14 LAB — URIC ACID: Uric Acid, Serum: 12.9 mg/dL — ABNORMAL HIGH (ref 4.0–8.0)

## 2017-10-14 LAB — HEMOGLOBIN A1C
EAG (MMOL/L): 8.2 (calc)
HEMOGLOBIN A1C: 6.8 %{Hb} — AB (ref <5.7)
Mean Plasma Glucose: 148 (calc)

## 2017-10-14 LAB — COMPLETE METABOLIC PANEL WITH GFR
AG Ratio: 1.1 (calc) (ref 1.0–2.5)
ALKALINE PHOSPHATASE (APISO): 105 U/L (ref 40–115)
ALT: 12 U/L (ref 9–46)
AST: 13 U/L (ref 10–35)
Albumin: 3.6 g/dL (ref 3.6–5.1)
BUN/Creatinine Ratio: 18 (calc) (ref 6–22)
BUN: 34 mg/dL — ABNORMAL HIGH (ref 7–25)
CALCIUM: 9.2 mg/dL (ref 8.6–10.3)
CO2: 31 mmol/L (ref 20–32)
Chloride: 96 mmol/L — ABNORMAL LOW (ref 98–110)
Creat: 1.88 mg/dL — ABNORMAL HIGH (ref 0.70–1.33)
GFR, EST NON AFRICAN AMERICAN: 41 mL/min/{1.73_m2} — AB (ref >=60)
GFR, Est African American: 47 mL/min/{1.73_m2} — ABNORMAL LOW (ref >=60)
GLUCOSE: 107 mg/dL — AB (ref 65–99)
Globulin: 3.3 g/dL (calc) (ref 1.9–3.7)
Potassium: 3.8 mmol/L (ref 3.5–5.3)
Sodium: 139 mmol/L (ref 135–146)
Total Bilirubin: 0.7 mg/dL (ref 0.2–1.2)
Total Protein: 6.9 g/dL (ref 6.1–8.1)

## 2017-10-14 LAB — PSA, TOTAL WITH REFLEX TO PSA, FREE: PSA, Total: 0.1 ng/mL (ref <=4.0)

## 2017-10-18 ENCOUNTER — Encounter: Payer: Medicaid Other | Admitting: Family Medicine

## 2017-12-02 ENCOUNTER — Ambulatory Visit (INDEPENDENT_AMBULATORY_CARE_PROVIDER_SITE_OTHER): Payer: PPO | Admitting: Family Medicine

## 2017-12-02 ENCOUNTER — Encounter: Payer: Self-pay | Admitting: Family Medicine

## 2017-12-02 VITALS — BP 136/65 | HR 91 | Temp 98.4°F | Resp 16 | Ht 69.0 in | Wt 357.0 lb

## 2017-12-02 DIAGNOSIS — E1169 Type 2 diabetes mellitus with other specified complication: Secondary | ICD-10-CM | POA: Diagnosis not present

## 2017-12-02 DIAGNOSIS — I10 Essential (primary) hypertension: Secondary | ICD-10-CM | POA: Diagnosis not present

## 2017-12-02 DIAGNOSIS — I5022 Chronic systolic (congestive) heart failure: Secondary | ICD-10-CM | POA: Diagnosis not present

## 2017-12-02 DIAGNOSIS — E785 Hyperlipidemia, unspecified: Secondary | ICD-10-CM | POA: Diagnosis not present

## 2017-12-02 DIAGNOSIS — N184 Chronic kidney disease, stage 4 (severe): Secondary | ICD-10-CM

## 2017-12-02 DIAGNOSIS — Z794 Long term (current) use of insulin: Secondary | ICD-10-CM

## 2017-12-02 DIAGNOSIS — E1122 Type 2 diabetes mellitus with diabetic chronic kidney disease: Secondary | ICD-10-CM | POA: Diagnosis not present

## 2017-12-02 DIAGNOSIS — Z0000 Encounter for general adult medical examination without abnormal findings: Secondary | ICD-10-CM

## 2017-12-02 DIAGNOSIS — Z6841 Body Mass Index (BMI) 40.0 and over, adult: Secondary | ICD-10-CM | POA: Diagnosis not present

## 2017-12-02 DIAGNOSIS — M1A39X0 Chronic gout due to renal impairment, multiple sites, without tophus (tophi): Secondary | ICD-10-CM | POA: Diagnosis not present

## 2017-12-02 NOTE — Assessment & Plan Note (Signed)
Wt gain in past 6 months Encourage improve lifestyle as discussed

## 2017-12-02 NOTE — Assessment & Plan Note (Addendum)
Stable CKD-IV Followed by [facility] Dr Juleen China Remains off Allopurinol - now acute flare recently gout - Recommend return to Dr Juleen China at [facility] to review CKD and gout recommendations, may consider switch to Uloric (Febuxostat) 40mg  renally dosed as next alternative for gout prevention Per patient Lisinopril 2.5 per Cardiology Only on Bumex for diuresis. No furosemide Continue improved hydration  Sent staff message to Dr Juleen China regarding scheduling f/u and gout prophylaxis options - patient was encouraged to call himself and schedule.

## 2017-12-02 NOTE — Assessment & Plan Note (Signed)
Controlled cholesterol on statin improving lifestyle Mild elevated TG  Plan: 1. Continue current meds - Atorvastatin 20mg  - refilled 2. Continue ASA 81mg  for primary ASCVD risk reduction 3. Encourage improved lifestyle - low carb/cholesterol, reduce portion size, continue improving regular exercise

## 2017-12-02 NOTE — Assessment & Plan Note (Signed)
Recent acute flare recurrent problem off allopurinol Likely multifactorial, dietary trigger, renal Uric acid 12.9 last  Plan Follow-up back with Dr Juleen China [facility] for discussing prophylaxis options - advised patient that possibly we could restart allopurinol if improved CKD. - Also considered Uloric 40-80mg , since CrCl >30, also question probenecid - Sent msg to Dr Juleen China, patient should schedule to follow-up

## 2017-12-02 NOTE — Patient Instructions (Addendum)
Thank you for coming to the office today.  Reduce Lantus from 20 u down to 18 u then down by 2 more units every week as long as sugar is < 150 on average fasting.  Stop around 10 units when you get there. Goal to reduce LOW sugar.  ------------------------------------------------------  Call their office to check status of next appointment to follow-up chronic kidney disease - and specifically discuss gout and if you can take any of the preventative medicines - I would recommend asking if it is safe to take Febuxostat (Uloric) for gout  -------------------------------------------------------  Recommend getting cologuard completed before end of year.  Your provider would like to you have your annual eye exam. Please contact your current eye doctor or here are some good options for you to contact.   [facility]    Address: [address] [place], [place] [address] Phone: [phone]  Website: [email or web address] [address], [place], [place] [address] Phone: [phone] [email or web address]  [facility]  Address: [place], [place], [place] [address] Phone: [phone]   [facility] [address] [place], [place] [address] Phone: [phone]  [facility] Address: [place], [place], [name] [address]  Phone: [phone]   Please schedule a Follow-up Appointment to: Return in about 4 months (around 04/02/2018) for DM A1c, med adjust, CKD, Gout.  If you have any other questions or concerns, please feel free to call the office or send a message through [place]. You may also schedule an earlier appointment if necessary.  Additionally, you may be receiving a survey about your experience at our office within a few days to 1 week by e-mail or mail. We value your feedback.  [name], DO [place]

## 2017-12-02 NOTE — Assessment & Plan Note (Signed)
Stable, currently well compensated, chronic systolic CHF secondary to non-ischemic cardiomyopathy Followed by [facility] Cardiology CHF clinic On med management low dose ACEi Lisinopril 2.5mg , Carvedilol, Bumex 4mg  BID diuresis Has declined ICD in past

## 2017-12-02 NOTE — Assessment & Plan Note (Signed)
Well-controlled DM with C6R 6.8 Complications - CKD-IV, DM retinopathy Also complicated by hypoglycemia episodes Failed Bydureon (unable to operate pen), Trulicity (adherence)  Plan:  1. Reduce Lantus from 20 u down to 18 u then down by 2 more units every week as long as sugar is < 150 on average fasting. 2. Encourage improved lifestyle - low carb, low sugar diet, reduce portion size, continue improving regular exercise 3. Check CBG, bring log to next visit for review 4. Continue ASA, ACEi, Statin 5. Need to schedule DM Eye - again advised him that he has been non adherent with this maintenance item 6. Follow-up 4 months DM A1c

## 2017-12-02 NOTE — Assessment & Plan Note (Signed)
Controlled HTN - Home BP readings normal, infrequent checks  Complication with CKD-IV, Systolic CHF    Plan:  1. Continue current BP regimen - Carvedilol 25mg  BID and on diuretic Bumex 4mg  BID - Per patient Lisinopril 2.5mg  daily was restarted by cardiology - he does not have pill bottles 2. Encourage improved lifestyle - low sodium diet, regular exercise 3. Start monitor BP outside office, bring readings to next visit, if persistently >140/90 or new symptoms notify office sooner 4. Follow-up 4 months,  f/u with Cardiology and return to Nephrology

## 2017-12-02 NOTE — Progress Notes (Signed)
Subjective:    Patient ID: [name], male    DOB: [birth year], 52 y.o.   MRN: [id number]  [name] is a 52 y.o. male presenting on 12/02/2017 for Annual Exam   HPI   Here for Annual Physical and Lab Review  CHRONIC DM, Type 2controlled with complications Last lab P8E 6.8, improved. He admits a few episodes of symptomatic hypoglycemia. CBGs:Avg 120 to 140. Low 80s, high < 170 Meds:Lantus 20u in AM daily - he has not adjusted dose. - Failed multiple GLP1 agent in past. Reports good compliance. Tolerating well w/o side-effects Currently on ACEi Lifestyle: - Dietand Exercise(improving but still limited exercise tolerance) - Complication CKD - Last DM Ophtho 07/21/16 with DM retinopathy Admits episodes hypoglycemia only if not eating or skips meals, approx 70-80 he has symptoms and feels lightheaded and near syncope, but this does not happen if he stays hydrated and eats normal meals Denies polyuria, visual changes, numbness or tingling.  Chronic Systolic CHF, Severe Non-Ischemic Cardiomyopathy - Followed by [facility] CHF. Last seen 04/2017. Prior ECHO LVEF 20-25% 12/2014 - then significant improvement to LVEF 50% by 2019, NYHA Class II, he has had aggressive diuresis in past including IV, limited by AoCKD - Managed now on diuretics, see cardiology note - they have restarted lisinopril 2.34m - No longer on Furosemide. Cannot take Spironolactone due to HyperK problem in past. - He has been offered by Cardiology to have ICD in future, due to risk of sudden cardiac death, per record he has declined this - Improved hydration water intake now, doing better - he wears compression stockings   Chronic Pain Syndrome /OA / CKD-IV / Gout / Morbid Obesity Chronic pain limited due to CKD,treated withTramadol rarely PRN for joint pain, see prior notes for background information. - Interval update with worsening gout flares since off allopurinol. - Now recent gout  flare. His Uric Acid is 12.9 - Today patient reportsthat he rarely takes Tramadol, only uses it if flare, does not take everyday usually takes 2 per dose once to control pain, usually knee or back pain - He was taken off Allopurinol due to CKD - He has not returned to Dr KJuleen China- [facility] nephrology - despite being asked to last apt and advised he needs other gout prevention med - Denies any fall or injury, redness, worsening swelling, other joint pain  Unpredictable, gout multiple joints, with pain and swelling, and worse after off allopurinol since 12/2016 Cannot take NSAIDs   Health Maintenance: Colon CA Screening: Never had colonoscopy.Currently asymptomatic. No known family history of colon CA. He has Cologuard but has not completed kit or sent it back.  He has not scheduled routine Diabetic Eye Exam - states change of ins needs to find new eye doctor.   [facility] 12/02/2017 06/15/2017 10/15/2016  Decreased Interest 0 0 0  Down, Depressed, Hopeless 0 0 0  PHQ - 2 Score 0 0 0    Past Medical History:  Diagnosis Date  . Allergy   . Asthma   . Gout   . Hypertension   . Morbid obesity ([place]    History reviewed. No pertinent surgical history. Social History   Socioeconomic History  . Marital status: Single    Spouse name: Not on file  . Number of children: Not on file  . Years of education: Not on file  . Highest education level: Not on file  Occupational History  . Not on file  Social Needs  .  Financial resource strain: Not on file  . Food insecurity:    Worry: Not on file    Inability: Not on file  . Transportation needs:    Medical: Not on file    Non-medical: Not on file  Tobacco Use  . Smoking status: Never Smoker  . Smokeless tobacco: Never Used  Substance and Sexual Activity  . Alcohol use: Yes    Comment: a glass of wine and a couple beers a day  . Drug use: No  . Sexual activity: Not on file  Lifestyle  . Physical activity:    Days  per week: Not on file    Minutes per session: Not on file  . Stress: Not on file  Relationships  . Social connections:    Talks on phone: Not on file    Gets together: Not on file    Attends religious service: Not on file    Active member of club or organization: Not on file    Attends meetings of clubs or organizations: Not on file    Relationship status: Not on file  . Intimate partner violence:    Fear of current or ex partner: Not on file    Emotionally abused: Not on file    Physically abused: Not on file    Forced sexual activity: Not on file  Other Topics Concern  . Not on file  Social History Narrative  . Not on file   Family History  Problem Relation Age of Onset  . Diabetes Mother   . Hypertension Mother   . Alcohol abuse Father   . Glaucoma Father   . Diabetes Father   . Hypertension Father   . Cancer Neg Hx   . COPD Neg Hx   . Heart disease Neg Hx   . Stroke Neg Hx    Current Outpatient Medications on File Prior to Visit  Medication Sig  . albuterol (PROVENTIL) (2.5 MG/3ML) 0.083% nebulizer solution Take 3 mLs (2.5 mg total) by nebulization every 6 (six) hours as needed for wheezing or shortness of breath.  [name] Kitchen albuterol (VENTOLIN HFA) 108 (90 Base) MCG/ACT inhaler Inhale 2 puffs into the lungs every 4 (four) hours as needed for wheezing or shortness of breath.  [name] Kitchen aspirin 81 MG chewable tablet Chew 81 mg by mouth daily.  [name] Kitchen atorvastatin (LIPITOR) 20 MG tablet Take 1 tablet (20 mg total) by mouth daily.  . blood glucose meter kit and supplies KIT Brand of choice; LON 99 months; check FSBS 3x a day; E11.65; disp one meter with 100 strips + 1 refill of strips  . carvedilol (COREG) 25 MG tablet Take 1 tablet (25 mg total) by mouth 2 (two) times daily with a meal.  . Insulin Glargine (LANTUS SOLOSTAR) 100 UNIT/ML Solostar Pen Inject 20 Units into the skin daily.  . Insulin Pen Needle (B-D UF III MINI PEN NEEDLES) 31G X 5 MM MISC 1 each by Does not apply route daily. Use  once daily with Victoza.  [name] Kitchen loratadine (CLARITIN) 10 MG tablet Take 1 tablet (10 mg total) by mouth daily.  . metolazone (ZAROXOLYN) 5 MG tablet Take 5 mg by mouth.  [name] Kitchen omeprazole (PRILOSEC) 40 MG capsule Take 1 capsule (40 mg total) by mouth daily.  . ranitidine (ZANTAC) 150 MG tablet Take 1 tablet (150 mg total) by mouth daily before breakfast.  . traMADol (ULTRAM) 50 MG tablet Take 1-2 tablets (50-100 mg total) by mouth every 12 (twelve) hours as needed.  . vitamin  C (ASCORBIC ACID) 500 MG tablet Take 1,000 mg by mouth daily.  . Zinc 50 MG CAPS Take 50 mg by mouth daily.  . bumetanide (BUMEX) 1 MG tablet Take 1 mg by mouth.  [name] Kitchen lisinopril (PRINIVIL,ZESTRIL) 2.5 MG tablet Take 1 tablet (2.5 mg total) by mouth daily.   No current facility-administered medications on file prior to visit.     Review of Systems  Constitutional: Negative for activity change, appetite change, chills, diaphoresis, fatigue and fever.  HENT: Negative for congestion and hearing loss.   Eyes: Negative for visual disturbance.  Respiratory: Negative for apnea, cough, chest tightness, shortness of breath and wheezing.   Cardiovascular: Negative for chest pain, palpitations and leg swelling.  Gastrointestinal: Negative for abdominal pain, anal bleeding, blood in stool, constipation, diarrhea, nausea and vomiting.  Endocrine: Negative for cold intolerance.  Genitourinary: Negative for decreased urine volume, difficulty urinating, dysuria, frequency, hematuria, testicular pain and urgency.  Musculoskeletal: Positive for arthralgias and joint swelling. Negative for neck pain.  Skin: Negative for rash.  Allergic/Immunologic: Negative for environmental allergies.  Neurological: Negative for dizziness, weakness, light-headedness, numbness and headaches.  Hematological: Negative for adenopathy.  Psychiatric/Behavioral: Negative for behavioral problems, dysphoric mood and sleep disturbance. The patient is not nervous/anxious.      Per HPI unless specifically indicated above     Objective:    BP 136/65   Pulse 91   Temp 98.4 F (36.9 C) (Oral)   Resp 16   Ht _0  (1.753 m)   Wt (!) 357 lb (161.9 kg)   BMI 52.72 kg/m   Wt Readings from Last 3 Encounters:  12/02/17 (!) 357 lb (161.9 kg)  06/15/17 (!) 354 lb (160.6 kg)  01/21/17 (!) 352 lb 8 oz (159.9 kg)    Physical Exam  Constitutional: He is oriented to person, place, and time. He appears well-developed and well-nourished. No distress.  Well-appearing, slightly uncomfortable with residual joint pain from gout, cooperative, morbidly obese  HENT:  Head: Normocephalic and atraumatic.  Mouth/Throat: Oropharynx is clear and moist.  Eyes: Conjunctivae are normal. Right eye exhibits no discharge. Left eye exhibits no discharge.  Neck: Normal range of motion. Neck supple. No thyromegaly present.  Cardiovascular: Normal rate, regular rhythm, normal heart sounds and intact distal pulses.  No murmur heard. Pulmonary/Chest: Effort normal and breath sounds normal. No respiratory distress. He has no wheezes. He has no rales.  Musculoskeletal: Normal range of motion. He exhibits edema (mild trace to +1 edema bilateral lower extremity symmetrical, improved with compression stockings).  Residual swelling of left hand/wrist, seems improved, no erythema, non tender, intact ROM  Lymphadenopathy:    He has no cervical adenopathy.  Neurological: He is alert and oriented to person, place, and time.  Skin: Skin is warm and dry. No rash noted. He is not diaphoretic. No erythema.  Psychiatric: He has a normal mood and affect. His behavior is normal.  Well groomed, good eye contact, normal speech and thoughts  Nursing note and vitals reviewed.  Recent Labs    01/21/17 0823 06/15/17 0835 10/13/17 0802  HGBA1C 6.9 7.0* 6.8*     Results for orders placed or performed in visit on 10/13/17  Uric acid  Result Value Ref Range   Uric Acid, Serum 12.9 (H) 4.0 - 8.0 mg/dL   PSA, Total with Reflex to PSA, Free  Result Value Ref Range   PSA, Total 0.1 < OR = 4.0 ng/mL  Lipid panel  Result Value Ref Range   Cholesterol  115 <200 mg/dL   HDL 49 >40 mg/dL   Triglycerides 208 (H) <150 mg/dL   LDL Cholesterol (Calc) 39 mg/dL (calc)   Total CHOL/HDL Ratio 2.3 <5.0 (calc)   Non-HDL Cholesterol (Calc) 66 <130 mg/dL (calc)  COMPLETE METABOLIC PANEL WITH GFR  Result Value Ref Range   Glucose, Bld 107 (H) 65 - 99 mg/dL   BUN 34 (H) 7 - 25 mg/dL   Creat 1.88 (H) 0.70 - 1.33 mg/dL   GFR, Est Non African American 41 (L) > OR = 60 mL/min/1.53m   GFR, Est African American 47 (L) > OR = 60 mL/min/1.774m  BUN/Creatinine Ratio 18 6 - 22 (calc)   Sodium 139 135 - 146 mmol/L   Potassium 3.8 3.5 - 5.3 mmol/L   Chloride 96 (L) 98 - 110 mmol/L   CO2 31 20 - 32 mmol/L   Calcium 9.2 8.6 - 10.3 mg/dL   Total Protein 6.9 6.1 - 8.1 g/dL   Albumin 3.6 3.6 - 5.1 g/dL   Globulin 3.3 1.9 - 3.7 g/dL (calc)   AG Ratio 1.1 1.0 - 2.5 (calc)   Total Bilirubin 0.7 0.2 - 1.2 mg/dL   Alkaline phosphatase (APISO) 105 40 - 115 U/L   AST 13 10 - 35 U/L   ALT 12 9 - 46 U/L  CBC with Differential/Platelet  Result Value Ref Range   WBC 6.4 3.8 - 10.8 Thousand/uL   RBC 5.20 4.20 - 5.80 Million/uL   Hemoglobin 16.4 13.2 - 17.1 g/dL   HCT 49.1 38.5 - 50.0 %   MCV 94.4 80.0 - 100.0 fL   MCH 31.5 27.0 - 33.0 pg   MCHC 33.4 32.0 - 36.0 g/dL   RDW 13.0 11.0 - 15.0 %   Platelets 162 140 - 400 Thousand/uL   MPV 9.5 7.5 - 12.5 fL   Neutro Abs 4,096 1,500 - 7,800 cells/uL   Lymphs Abs 1,549 850 - 3,900 cells/uL   WBC mixed population 531 200 - 950 cells/uL   Eosinophils Absolute 173 15 - 500 cells/uL   Basophils Absolute 51 0 - 200 cells/uL   Neutrophils Relative % 64 %   Total Lymphocyte 24.2 %   Monocytes Relative 8.3 %   Eosinophils Relative 2.7 %   Basophils Relative 0.8 %  Hemoglobin A1c  Result Value Ref Range   Hgb A1c MFr Bld 6.8 (H) <5.7 % of total Hgb   Mean Plasma Glucose  148 (calc)   eAG (mmol/L) 8.2 (calc)      Assessment & Plan:   Problem List Items Addressed This Visit    Benign hypertension    Controlled HTN - Home BP readings normal, infrequent checks  Complication with CKD-IV, Systolic CHF    Plan:  1. Continue current BP regimen - Carvedilol 2523mID and on diuretic Bumex 4mg35mD - Per patient Lisinopril 2.5mg 35mly was restarted by cardiology - he does not have pill bottles 2. Encourage improved lifestyle - low sodium diet, regular exercise 3. Start monitor BP outside office, bring readings to next visit, if persistently >140/90 or new symptoms notify office sooner 4. Follow-up 4 months,  f/u with Cardiology and return to Nephrology      Relevant Medications   lisinopril (PRINIVIL,ZESTRIL) 2.5 MG tablet   Chronic systolic heart failure (HCC)    Stable, currently well compensated, chronic systolic CHF secondary to non-ischemic cardiomyopathy [facility] On med management low dose ACEi Lisinopril 2.5mg, 32mvedilol, Bumex 4mg BI35miuresis  Has declined ICD in past      Relevant Medications   lisinopril (PRINIVIL,ZESTRIL) 2.5 MG tablet   CKD (chronic kidney disease), stage IV (HCC)    Stable CKD-IV Followed by [facility] Dr Juleen China Remains off Allopurinol - now acute flare recently gout - Recommend return to Dr Juleen China at [name] to review CKD and gout recommendations, may consider switch to Uloric (Febuxostat) 44m renally dosed as next alternative for gout prevention Per patient Lisinopril 2.5 per Cardiology Only on Bumex for diuresis. No furosemide Continue improved hydration  Sent staff message to Dr KJuleen Chinaregarding scheduling f/u and gout prophylaxis options - patient was encouraged to call himself and schedule.      Gout    Recent acute flare recurrent problem off allopurinol Likely multifactorial, dietary trigger, renal Uric acid 12.9 last  Plan [facility]  prophylaxis options - advised patient that possibly we could restart allopurinol if improved CKD. - Also considered Uloric 40-822m since CrCl >30, also question probenecid - Sent msg to Dr KoJuleen Chinapatient should schedule to follow-up      Hyperlipidemia associated with type 2 diabetes mellitus ([place]   Controlled cholesterol on statin improving lifestyle Mild elevated TG  Plan: 1. Continue current meds - Atorvastatin 2023m refilled 2. Continue ASA 29m39mr primary ASCVD risk reduction 3. Encourage improved lifestyle - low carb/cholesterol, reduce portion size, continue improving regular exercise      Relevant Medications   lisinopril (PRINIVIL,ZESTRIL) 2.5 MG tablet   Morbid obesity with BMI of 50.0-59.9, adult (HCC)[place] Wt gain in past 6 months Encourage improve lifestyle as discussed      Type 2 diabetes mellitus, controlled, with renal complications (HCC)[place] [facility]-controlled DM with A1c G6Y Complications - CKD-IV, DM retinopathy Also complicated by hypoglycemia episodes Failed Bydureon (unable to operate pen), Trulicity (adherence)  Plan:  1. Reduce Lantus from 20 u down to 18 u then down by 2 more units every week as long as sugar is < 150 on average fasting. 2. Encourage improved lifestyle - low carb, low sugar diet, reduce portion size, continue improving regular exercise 3. Check CBG, bring log to next visit for review 4. Continue ASA, ACEi, Statin 5. Need to schedule DM Eye - again advised him that he has been non adherent with this maintenance item 6. Follow-up 4 months DM A1c      Relevant Medications   lisinopril (PRINIVIL,ZESTRIL) 2.5 MG tablet    Other Visit Diagnoses    Annual physical exam    -  Primary      Updated Health Maintenance information - He needs to complete Cologuard kit that was ordered - emphasized recommended screening Reviewed recent lab results with patient Encouraged improvement to lifestyle with diet and exercise - Goal of weight  loss   No orders of the defined types were placed in this encounter.   Follow up plan: Return in about 4 months (around 04/02/2018) for DM A1c, med adjust, CKD, Gout.  AlexNobie Putnam [facility] 12/02/2017, 8:41 AM

## 2017-12-06 DIAGNOSIS — I10 Essential (primary) hypertension: Secondary | ICD-10-CM | POA: Diagnosis not present

## 2017-12-06 DIAGNOSIS — E1165 Type 2 diabetes mellitus with hyperglycemia: Secondary | ICD-10-CM | POA: Diagnosis not present

## 2017-12-06 DIAGNOSIS — Z720 Tobacco use: Secondary | ICD-10-CM | POA: Diagnosis not present

## 2017-12-06 DIAGNOSIS — N183 Chronic kidney disease, stage 3 (moderate): Secondary | ICD-10-CM | POA: Diagnosis not present

## 2017-12-06 DIAGNOSIS — I50813 Acute on chronic right heart failure: Secondary | ICD-10-CM | POA: Diagnosis not present

## 2017-12-06 DIAGNOSIS — Z6841 Body Mass Index (BMI) 40.0 and over, adult: Secondary | ICD-10-CM | POA: Diagnosis not present

## 2017-12-06 DIAGNOSIS — I5022 Chronic systolic (congestive) heart failure: Secondary | ICD-10-CM | POA: Diagnosis not present

## 2017-12-06 DIAGNOSIS — E1129 Type 2 diabetes mellitus with other diabetic kidney complication: Secondary | ICD-10-CM | POA: Diagnosis not present

## 2017-12-07 ENCOUNTER — Other Ambulatory Visit: Payer: Self-pay

## 2017-12-07 DIAGNOSIS — M1A39X0 Chronic gout due to renal impairment, multiple sites, without tophus (tophi): Secondary | ICD-10-CM

## 2017-12-07 MED ORDER — ALLOPURINOL 100 MG PO TABS: 100.0000 mg | ORAL_TABLET | Freq: Every day | ORAL | 1 refills | Status: DC

## 2017-12-07 NOTE — Telephone Encounter (Signed)
Patient called back during lunch and I relayed this message to patient already. Do not need to call him again.  [name], DO [facility] 12/07/2017, 12:43 PM

## 2017-12-07 NOTE — Telephone Encounter (Signed)
Patient called reporting that the specialist told him it was ok to take allopurinol 200 mg daily.  He is also requesting something for the swelling.  Please advise

## 2017-12-07 NOTE — Telephone Encounter (Signed)
Already discussed with Dr Juleen China on [place] staff message, he agreed with plan for resuming low dose Allopurinol, starting at 100mg  daily. He may adjust in future, he did not stay 200mg  to me.  I sent this rx today 12/07/17, patient should go ahead and start 100mg  daily for gout.   This med may help reduce swelling but may take a few days to week.  In the meantime, I do not have other meds that I would recommend to treat swelling, he is already on a fluid pill from Cardiologist.  He may try to work on elevation to help swelling for now.  [name], DO [facility] 12/07/2017, 12:17 PM

## 2017-12-08 ENCOUNTER — Telehealth: Payer: Self-pay

## 2017-12-08 DIAGNOSIS — M1A39X0 Chronic gout due to renal impairment, multiple sites, without tophus (tophi): Secondary | ICD-10-CM

## 2017-12-08 MED ORDER — PREDNISONE 10 MG PO TABS: ORAL_TABLET | ORAL | 0 refills | Status: DC

## 2017-12-08 NOTE — Telephone Encounter (Signed)
Patient has received prednisone in past for acute gout and flare up with pain and swelling. Since he cannot take NSAID. He has recently been restarted on Allopurinol for gout prophylaxis, will agree to trial of Prednisone taper over 6 days, he should keep elevating and continue current therapy as well.  [name], [facility] 12/08/2017, 12:01 PM

## 2017-12-08 NOTE — Addendum Note (Signed)
Addended by: [name] on: 12/08/2017 12:01 PM   Modules accepted: Orders

## 2017-12-08 NOTE — Telephone Encounter (Signed)
Patient requesting steroid for swelling.

## 2017-12-10 ENCOUNTER — Telehealth: Payer: Self-pay | Admitting: Family Medicine

## 2017-12-10 NOTE — Telephone Encounter (Signed)
The medication is not helping pt's swollen finger and still hurting.  His call back number is [phone]

## 2017-12-10 NOTE — Telephone Encounter (Signed)
Called patient back, see telephone note from 12/08/17. He is already taking prednisone now, limited benefit, tried ice , no result, now using heat some improvement.  I advised, limited options now, continue allopurinol, continue / finish prednisone, may use ice / heat / compression / muscle rub and elevation.  He may follow-up or go to urgent care, if need, we can consider refer to rheum/ortho for possible joint drainage if need  [name], [facility] 12/10/2017, 4:33 PM

## 2018-01-03 ENCOUNTER — Telehealth: Payer: Self-pay | Admitting: Family Medicine

## 2018-01-03 ENCOUNTER — Telehealth: Payer: Self-pay

## 2018-01-03 DIAGNOSIS — Z794 Long term (current) use of insulin: Principal | ICD-10-CM

## 2018-01-03 DIAGNOSIS — E1122 Type 2 diabetes mellitus with diabetic chronic kidney disease: Secondary | ICD-10-CM

## 2018-01-03 DIAGNOSIS — N184 Chronic kidney disease, stage 4 (severe): Principal | ICD-10-CM

## 2018-01-03 MED ORDER — INSULIN GLARGINE 100 UNIT/ML SOLOSTAR PEN: 20.0000 [IU] | PEN_INJECTOR | Freq: Every day | SUBCUTANEOUS | 1 refills | Status: DC

## 2018-01-03 NOTE — Telephone Encounter (Signed)
Ordered Lantus 20u daily, for 5 of the 21mL pens total 23mL for nearly 3 month supply to [place], [facility] 01/03/2018, 5:05 PM

## 2018-01-03 NOTE — Telephone Encounter (Signed)
Can you please contact Lib[facility] Negron Torres or the patient's insurance to find out which alternative insulin is preferred? Either Basaglar or Levemir?   Perhaps Lantus is preferred, but his copay may be too high on any of these.  [name], DO [facility] 01/03/2018, 1:40 PM

## 2018-01-03 NOTE — Telephone Encounter (Signed)
Patient insurance changed from BC/BS to medicare part A,B,C and the one that can cover his Lantus is part C. The way he is taking Lantus is only 10 units but he wants provider to prescribe for 50 units so it can cost only $30 for 5 month supply Vs $100. Please suggest ?

## 2018-01-03 NOTE — Telephone Encounter (Signed)
Patient requesting Lantus refill for monthly sent to [facility] in [place].

## 2018-01-03 NOTE — Telephone Encounter (Signed)
[place] and all the other is probably same price so spoke to [place] to [employer] and find out their preferred alternative.

## 2018-01-05 ENCOUNTER — Other Ambulatory Visit: Payer: Self-pay | Admitting: *Deleted

## 2018-01-05 NOTE — Patient Outreach (Signed)
[place] [facility]) Care Management  01/05/2018  HARJIT LEIDER Sep 21, 1965 [id number]   Telephone Screen  Referral Date: 01/03/18  Referral Source: [facility] referral Referral Reason: Medication assistance Member is not able to afford his Lantus solostar and he is out of medication. Member was using a discount card to get medication at no charge before he went on [facility] but is not eligible now Member can not get just 30 day supply because pharmacy will not split the box   Insurance:HTA    Outreach attempt # 1 unsuccessful  No answer. [facility] RN CM left HIPAA compliant voicemail message along with CM's contact info.    Mr Mabe returned a call to [facility] RN CM Patient is able to verify HIPAA Reviewed and addressed referral to [facility] with patient  Social: Mr Terriquez is single and reports the only support system he has is his mother but this is occasionally. He is independent in all his care and transportation needs  Conditions: Asthma, type 2 DM, CKD, gout, vitamin D deficiency, tobacco abuse, HTN, allergies, chronic restrictive lung disease, GERD, HDL, osteoarthritis, gout, morbid obesity,    Medications: Mr Decesare reports his was getting his Lantus free with blue cross and blue shield [employer] coverage and a discount card but now the cost is greater than $65 He was able to pay the $65 without concerns when he had the discount card but does not feel he will be able to afford an further elevated price monthly    He reports he has filled the Rx for December 2019 after having to use other money for other bills to get Lantus filled. No support from any family available Dr Parks Ranger provides this Rx Dr Parks Ranger is not able to provided samples Mr Ozment reports he has called the office to attempt to get sample assistance Polypharmacy He reports having from 10 - 20 medicines to purchase No side effects from any medications and his other medications are less than $15 together he reports  getting various medications in a 3 month supply to attempt to save money He reports he has had med vouchers from [facility] previously   Appointments: Last saw Dr Parks Ranger on 12/02/17 and is not scheduled for another follow up appointment until March 2020  Advance Directives: Denies need for assist with advance directives   Consent: [facility] RN CM reviewed [facility] services with patient. Patient gave verbal consent for services.  Plan: [facility] RN CM will refer Mr Strohmeier to [place] staff for medication assistance STAT as the referral was HIGH  [facility] RN CM sent an unsuccessful outreach letter prior to him returning [place] call   Joelene Millin L. Lavina Hamman, RN, BSN, [place] Coordinator Office number [phone] Mobile number [phone]  Main [facility] number [phone] Fax number [phone]

## 2018-01-07 ENCOUNTER — Telehealth: Payer: Self-pay | Admitting: Pharmacist

## 2018-01-07 NOTE — Patient Outreach (Addendum)
[facility]) Care Management  [place]   01/07/2018  [name] [birth year] [id number]  Reason for referral: Medication assistance Lantus  Referral source: [facility] Referral medication(s): Lantus Current insurance:HTA  Patient was called regarding medication assistance. HIPAA identifiers were obtained. Patient is a 52 year old male with multiple medical condition including but not limited to:  Allergic rhinitis, GERD, type 2 diabetes, hyperlipidemia, CKD, vitamin D deficiency, Gout, tobacco abuse, and chronic pain.  Patient reported having issues affording Lantus. He said he picked it up today and a 120 day supply paid $90.  He confirmed that he does have Lantus.  Patient recently switched to Medicare. He previously had commercial insurance and was able to use co-pay coupons. Copay coupons cannot be used with Medicare Part D.  Objective: No Known Allergies  Medications Reviewed Today    Reviewed by Elayne Guerin, [facility] (Pharmacist) on 01/07/18 at 23  Med List Status: <None>  Medication Order Taking? Sig Documenting Provider Last Dose Status Informant  albuterol (PROVENTIL) (2.5 MG/3ML) 0.083% nebulizer solution [id number] Yes Take 3 mLs (2.5 mg total) by nebulization every 6 (six) hours as needed for wheezing or shortness of breath. Luciana Axe, NP Taking Active   albuterol (VENTOLIN HFA) 108 (90 Base) MCG/ACT inhaler [id number] Yes Inhale 2 puffs into the lungs every 4 (four) hours as needed for wheezing or shortness of breath. Arnetha Courser, MD Taking Active   allopurinol (ZYLOPRIM) 100 MG tablet [id number] Yes Take 1 tablet (100 mg total) by mouth daily. [name], DO Taking Active   aspirin 81 MG chewable tablet [id number] Yes Chew 81 mg by mouth daily. [provider] Taking Active   atorvastatin (LIPITOR) 20 MG tablet [id number] Yes Take 1 tablet (20 mg total) by mouth daily. [name], DO Taking Active   blood  glucose meter kit and supplies KIT [id number] Yes Brand of choice; LON 99 months; check FSBS 3x a day; E11.65; disp one meter with 100 strips + 1 refill of strips [name], [name], MD Taking Active   bumetanide (BUMEX) 1 MG tablet [id number]  Take 1 mg by mouth. [provider]  Expired 04/13/17 2359   carvedilol (COREG) 25 MG tablet [id number] Yes Take 1 tablet (25 mg total) by mouth 2 (two) times daily with a meal.  Patient taking differently:  Take 50 mg by mouth 2 (two) times daily with a meal.    Parks Ranger, [name], DO Taking Active   Insulin Glargine (LANTUS SOLOSTAR) 100 UNIT/ML Solostar Pen [id number] Yes Inject 20 Units into the skin daily. [name], DO Taking Active   Insulin Pen Needle (B-D UF III MINI PEN NEEDLES) 31G X 5 MM MISC [id number] Yes 1 each by Does not apply route daily. Use once daily with Victoza. [name], DO Taking Active   lisinopril (PRINIVIL,ZESTRIL) 2.5 MG tablet [id number] Yes Take 1 tablet (2.5 mg total) by mouth daily. [name], DO Taking Active   loratadine (CLARITIN) 10 MG tablet [id number] Yes Take 1 tablet (10 mg total) by mouth daily. [name], DO Taking Active   metolazone (ZAROXOLYN) 5 MG tablet [id number] Yes Take 5 mg by mouth. [provider] Taking Active            Med Note Elayne Guerin   Fri Jan 07, 2018  5:07 PM) PRN extra fluid  omeprazole (PRILOSEC) 40 MG capsule [id number] Yes Take 1 capsule (40 mg total) by mouth  daily. [name], DO Taking Active   predniSONE (DELTASONE) 10 MG tablet [id number] No Take 6 tabs with breakfast Day 1, 5 tabs Day 2, 4 tabs Day 3, 3 tabs Day 4, 2 tabs Day 5, 1 tab Day 6.  Patient not taking:  Reported on 01/07/2018   [name], DO Not Taking Active   ranitidine (ZANTAC) 150 MG tablet [id number] Yes Take 1 tablet (150 mg total) by mouth daily before breakfast. [name], DO Taking Active   traMADol  (ULTRAM) 50 MG tablet [id number] Yes Take 1-2 tablets (50-100 mg total) by mouth every 12 (twelve) hours as needed. [name], DO Taking Active   vitamin C (ASCORBIC ACID) 500 MG tablet [id number] Yes Take 1,000 mg by mouth daily. [provider] Taking Active            Med Note [name], [name]   Tue Apr 09, 2015  8:28 AM)    Zinc 50 MG CAPS [id number] Yes Take 50 mg by mouth daily. [provider] Taking Active           Assessment:  Drugs sorted by system:  Cardiovascular: Aspirin, Atorvastatin, Bumetanide,  Carvedilol, Lisinopril, Metolazone,   Pulmonary/Allergy: Ventolin HFA, Albuterol nebulizer solution, Loratadine,   Gastrointestinal: Omeprazole, Ranitidine,   Endocrine: Allopurinol, Lantus,   Pain: Tramadol,   Vitamins/Minerals/Supplements: Vitamin C, Zinc  Medication Review Findings:  . Dose discrepancy-patient reported taking Carvedilol 53m two tablets twice daily vs taking 1 tablet twice daily.  Medication Assistance Findings:  Extra Help:   [] Already receiving Full Extra Help  [] Already receiving Partial Extra Help  [] Eligible based on reported income and assets  [] Not Eligible based on reported income and assets Email sent to [facility] to investigate the patient's current LIS status  Patient Assistance Programs: o [employer], [employer] by [employer](Patient does not meet requirements for [employer]'s program due to out-of-pocket expenses) o Income requirement met: [] Yes [x] No [] Unknown Out-of-pocket prescription expenditure met:    [] Yes [] No  [x] Unknown  [] Not applicable (Will have to complete an override letter)   Additional medication assistance options reviewed with patient as warranted:  No other options identified  Plan: Reach out to the patient's provider about switching to Basaglar due to finances. Call JPles Specter(Technician at [facility]) to follow up.    If patient does not have LIS, an Extra Help Application will be  completed  If the patient's provider agree's to change the patient's therapy, send [place] Forms.   KElayne Guerin PharmD, [place] Pharmacist ([phone]

## 2018-01-10 ENCOUNTER — Ambulatory Visit: Payer: Self-pay | Admitting: Pharmacist

## 2018-01-10 ENCOUNTER — Other Ambulatory Visit: Payer: Self-pay | Admitting: Pharmacist

## 2018-01-10 NOTE — Patient Outreach (Signed)
[place] [facility]) Care Management  01/10/2018  [name] 1965-05-23 [id number]   Patient was called regarding medication assistance. HIPAA identifiers were obtained. Dr. Jeronimo Norma sent a message back stating that he would be willing to switch the patient from Lantus to [facility] for financial reasons.    Patient communicated understanding about signing the application that will be sent to him as well as sending back household financial documentation.  Plan: Route note to [employer], CPhT to send the patient.   Elayne Guerin, PharmD, [place] Clinical Pharmacist [phone]

## 2018-01-11 ENCOUNTER — Other Ambulatory Visit: Payer: Self-pay | Admitting: Pharmacy Technician

## 2018-01-11 ENCOUNTER — Other Ambulatory Visit: Payer: Self-pay | Admitting: Family Medicine

## 2018-01-11 ENCOUNTER — Encounter: Payer: Self-pay | Admitting: Family Medicine

## 2018-01-11 DIAGNOSIS — N184 Chronic kidney disease, stage 4 (severe): Principal | ICD-10-CM

## 2018-01-11 DIAGNOSIS — Z794 Long term (current) use of insulin: Principal | ICD-10-CM

## 2018-01-11 DIAGNOSIS — E1122 Type 2 diabetes mellitus with diabetic chronic kidney disease: Secondary | ICD-10-CM

## 2018-01-11 NOTE — Progress Notes (Signed)
Received form from [employer] - to complete provider section regarding Basaglar rx switched from lantus to basaglar 20unit daily - may titrate down as advised. Up to 3 month, with refills. To be faxed back to [place] Management / Pharmacy  [name], [facility] 01/11/2018, 4:38 PM

## 2018-01-11 NOTE — Patient Outreach (Signed)
[facility]) Care Management  01/11/2018  [name] 08/31/1965 [id number]   Recevied 2020 patient assistance referral from [place] for [employer] through [employer].  Prepared patient portion to be mailed. Faxed provider portion to Dr. Parks Ranger.  Will followup with patient in 10-14 business days to confirm receipt of application.  [name] P. Kai Calico, [place] [facility] [phone]

## 2018-01-21 ENCOUNTER — Other Ambulatory Visit: Payer: Self-pay | Admitting: Pharmacy Technician

## 2018-01-21 NOTE — Patient Outreach (Signed)
[facility]) Care Management  01/21/2018  [name] [birth year] [id number]  Unsuccessful outreach call placed to patient, HIPAA compliant voicemail left.  Call placed to patient to inquire if he had received his [name] for Basaglar in the mail. Unfortunately patient did not answer the phone.  Will followup in 3-5 business days with 2nd outreach attempt if call is not returned.  Hershel Corkery P. Janequa Kipnis, [place] Management [phone]

## 2018-01-24 ENCOUNTER — Ambulatory Visit: Payer: PPO | Admitting: Pharmacist

## 2018-01-25 ENCOUNTER — Other Ambulatory Visit: Payer: Self-pay | Admitting: Pharmacist

## 2018-01-25 ENCOUNTER — Ambulatory Visit: Payer: Self-pay | Admitting: Pharmacist

## 2018-01-25 NOTE — Patient Outreach (Signed)
[facility]) Care Management  01/25/2018  [name] [birth year] [id number]   Unsuccessful outreach to the patient today regarding patient assistance forms that were sent to him. HIPAA compliant message was left on the patient's voicemail.  Plan: Send patient an unsuccessful outreach letter. Sharee Pimple [place] has attempted to reach the patient unsuccessfully as well.  Sharee Pimple called patient on 01/21/18) If patient does not call back within 10 business days of this letter, he will be called again per protocol and his pharmacy case will be closed.   Elayne Guerin, PharmD, [place] Clinical Pharmacist [phone]

## 2018-02-01 ENCOUNTER — Telehealth: Payer: Self-pay

## 2018-02-01 DIAGNOSIS — M1A39X0 Chronic gout due to renal impairment, multiple sites, without tophus (tophi): Secondary | ICD-10-CM

## 2018-02-01 DIAGNOSIS — M150 Primary generalized (osteo)arthritis: Principal | ICD-10-CM

## 2018-02-01 DIAGNOSIS — M159 Polyosteoarthritis, unspecified: Secondary | ICD-10-CM

## 2018-02-01 MED ORDER — GABAPENTIN 100 MG PO CAPS: ORAL_CAPSULE | ORAL | 1 refills | Status: DC

## 2018-02-01 NOTE — Telephone Encounter (Signed)
Patient requesting Neurontin for nerve pain and tingling has from couple of days was discussed at last visit per patient.

## 2018-02-01 NOTE — Telephone Encounter (Signed)
Called patient and left a voicemail. Sent new rx Gabapentin to [facility]. Renally dosed BID dosing, titrate up to 100-300mg  per dose, may adjust further if need.  [name], [place] [facility] 02/01/2018, 8:31 AM

## 2018-02-08 ENCOUNTER — Other Ambulatory Visit: Payer: Self-pay | Admitting: Pharmacy Technician

## 2018-02-08 NOTE — Patient Outreach (Signed)
[facility]) Care Management  02/08/2018  [name] 1965-09-30 [id number]   Successful outreach call placed to patient in regards to [place] patient assistance application for Basaglar.  Spoke to [name], HIPAA identifiers verified. Inquired if [name] had received and mailed back his portion of the [place] application. Patient stated he thinks he has received it but was in no hurry to send it back in as he had just recently purchased a supply of the medication. He stated that supply would last him a good while (>90mo) as he only uses a small amount. He inquired if there was a reason for it to be a "hurry" to send the documents in. Informed patient that there was necessarily no "hurry" but that if he mailed in his portion I could go ahead and submit the application (as I already have the provider's portion back) and then when he needed a refill he could reach out to [place] and if approved then he would receive the medication at no cost. Patient verbalized understanding and then requested another application be sent out as he wasn't real sure where he had placed the initial application.  Plan to re mail out the patient portion of the [place] application. Patient to mail back at his leisure. Will followup with patient in 14-21 business days if application has not been received back.  [name], [place] Management [phone]

## 2018-02-09 ENCOUNTER — Ambulatory Visit: Payer: Self-pay | Admitting: Pharmacist

## 2018-02-09 ENCOUNTER — Other Ambulatory Visit: Payer: Self-pay | Admitting: Pharmacist

## 2018-02-09 ENCOUNTER — Other Ambulatory Visit: Payer: Self-pay | Admitting: Pharmacy Technician

## 2018-02-09 NOTE — Patient Outreach (Signed)
[place] [facility]) Care Management  02/09/2018  Lathen Seal [place] [birth year] [id number]    Opened in error, Luiz Ochoa. Marlo Goodrich, [place] Management [phone]

## 2018-02-09 NOTE — Patient Outreach (Addendum)
[facility]) Care Management  02/09/2018  PREET PERRIER [birth year] [id number]   Patient was outreached by [facility] Certified Technician, [name].  Patient told Sharee Pimple he received the applications we sent him and that he had enough insulin to last him a few months. He requested new applications be sent to him but said he would complete them at his leisure.  Since the patient is not planning on completing and mailing the forms back to [facility] in a timely fashion, his pharmacy case will be closed. If the patient completes the forms and mails them back at a later date, his case will be reopened.  Patient was over income for LIS/Extra Help.   Plan: Send closure letters Close case. Reopen case if patient returns the forms he was sent.  Elayne Guerin, PharmD, [place] Clinical Pharmacist [phone]

## 2018-02-21 ENCOUNTER — Telehealth: Payer: Self-pay | Admitting: Family Medicine

## 2018-02-21 NOTE — Telephone Encounter (Signed)
Left message for patient to call back  

## 2018-02-21 NOTE — Telephone Encounter (Signed)
Patient suggested to keep log for blood sugar and advised as per Dr. Raliegh Ip.

## 2018-02-21 NOTE — Telephone Encounter (Signed)
I agree. He could be hypoglycemic if he is taking insulin and not eating much. He should check sugar. If passing out again or with severely low sugar < 70, and feels sick he may need to go to hospital or call EMS for evaluation, may need IVF rehydration if dehydrated and medicine through IV.  If his sugar improves and he feels better then likely with poor intake while on insulin still.  [name], [facility] 02/21/2018, 11:55 AM

## 2018-02-21 NOTE — Telephone Encounter (Signed)
As per patient he was trying not to eat too much also had stomach virus onset week, he is getting dizzy around 4-5 o'clock everyday ever since week and blacked out once while driving, may be getting hypoglycemic advised patient to check his sugar level which he is not checking and will call him back with provider reply. He states he will try to eat more and record his sugar level.

## 2018-02-28 ENCOUNTER — Emergency Department: Payer: PPO

## 2018-02-28 ENCOUNTER — Inpatient Hospital Stay
Admission: EM | Admit: 2018-02-28 | Discharge: 2018-03-05 | DRG: 871 | Disposition: A | Discharge status: Home / Self Care | Payer: PPO | Attending: Internal Medicine | Admitting: Internal Medicine

## 2018-02-28 ENCOUNTER — Inpatient Hospital Stay: Payer: PPO

## 2018-02-28 ENCOUNTER — Encounter: Payer: Self-pay | Admitting: Emergency Medicine

## 2018-02-28 ENCOUNTER — Other Ambulatory Visit: Payer: Self-pay

## 2018-02-28 DIAGNOSIS — I5031 Acute diastolic (congestive) heart failure: Secondary | ICD-10-CM | POA: Diagnosis not present

## 2018-02-28 DIAGNOSIS — Z8249 Family history of ischemic heart disease and other diseases of the circulatory system: Secondary | ICD-10-CM | POA: Diagnosis not present

## 2018-02-28 DIAGNOSIS — Z7982 Long term (current) use of aspirin: Secondary | ICD-10-CM | POA: Diagnosis not present

## 2018-02-28 DIAGNOSIS — E871 Hypo-osmolality and hyponatremia: Secondary | ICD-10-CM | POA: Diagnosis not present

## 2018-02-28 DIAGNOSIS — Z79899 Other long term (current) drug therapy: Secondary | ICD-10-CM | POA: Diagnosis not present

## 2018-02-28 DIAGNOSIS — E875 Hyperkalemia: Secondary | ICD-10-CM | POA: Diagnosis present

## 2018-02-28 DIAGNOSIS — R0989 Other specified symptoms and signs involving the circulatory and respiratory systems: Secondary | ICD-10-CM | POA: Diagnosis not present

## 2018-02-28 DIAGNOSIS — Z452 Encounter for adjustment and management of vascular access device: Secondary | ICD-10-CM | POA: Diagnosis not present

## 2018-02-28 DIAGNOSIS — N170 Acute kidney failure with tubular necrosis: Secondary | ICD-10-CM | POA: Diagnosis present

## 2018-02-28 DIAGNOSIS — J9600 Acute respiratory failure, unspecified whether with hypoxia or hypercapnia: Secondary | ICD-10-CM | POA: Diagnosis not present

## 2018-02-28 DIAGNOSIS — Z794 Long term (current) use of insulin: Secondary | ICD-10-CM

## 2018-02-28 DIAGNOSIS — E1122 Type 2 diabetes mellitus with diabetic chronic kidney disease: Secondary | ICD-10-CM | POA: Diagnosis not present

## 2018-02-28 DIAGNOSIS — A413 Sepsis due to Hemophilus influenzae: Principal | ICD-10-CM | POA: Diagnosis present

## 2018-02-28 DIAGNOSIS — N179 Acute kidney failure, unspecified: Secondary | ICD-10-CM | POA: Diagnosis not present

## 2018-02-28 DIAGNOSIS — N19 Unspecified kidney failure: Secondary | ICD-10-CM | POA: Diagnosis not present

## 2018-02-28 DIAGNOSIS — I5042 Chronic combined systolic (congestive) and diastolic (congestive) heart failure: Secondary | ICD-10-CM | POA: Diagnosis present

## 2018-02-28 DIAGNOSIS — R6521 Severe sepsis with septic shock: Secondary | ICD-10-CM | POA: Diagnosis present

## 2018-02-28 DIAGNOSIS — J189 Pneumonia, unspecified organism: Secondary | ICD-10-CM | POA: Diagnosis not present

## 2018-02-28 DIAGNOSIS — J9601 Acute respiratory failure with hypoxia: Secondary | ICD-10-CM | POA: Diagnosis not present

## 2018-02-28 DIAGNOSIS — M109 Gout, unspecified: Secondary | ICD-10-CM | POA: Diagnosis not present

## 2018-02-28 DIAGNOSIS — R918 Other nonspecific abnormal finding of lung field: Secondary | ICD-10-CM

## 2018-02-28 DIAGNOSIS — E861 Hypovolemia: Secondary | ICD-10-CM | POA: Diagnosis present

## 2018-02-28 DIAGNOSIS — E119 Type 2 diabetes mellitus without complications: Secondary | ICD-10-CM | POA: Diagnosis not present

## 2018-02-28 DIAGNOSIS — N184 Chronic kidney disease, stage 4 (severe): Secondary | ICD-10-CM | POA: Diagnosis not present

## 2018-02-28 DIAGNOSIS — N183 Chronic kidney disease, stage 3 (moderate): Secondary | ICD-10-CM | POA: Diagnosis not present

## 2018-02-28 DIAGNOSIS — Z6841 Body Mass Index (BMI) 40.0 and over, adult: Secondary | ICD-10-CM

## 2018-02-28 DIAGNOSIS — A419 Sepsis, unspecified organism: Secondary | ICD-10-CM | POA: Diagnosis not present

## 2018-02-28 DIAGNOSIS — I130 Hypertensive heart and chronic kidney disease with heart failure and stage 1 through stage 4 chronic kidney disease, or unspecified chronic kidney disease: Secondary | ICD-10-CM | POA: Diagnosis present

## 2018-02-28 DIAGNOSIS — J14 Pneumonia due to Hemophilus influenzae: Secondary | ICD-10-CM | POA: Diagnosis present

## 2018-02-28 DIAGNOSIS — E860 Dehydration: Secondary | ICD-10-CM | POA: Diagnosis not present

## 2018-02-28 DIAGNOSIS — J181 Lobar pneumonia, unspecified organism: Secondary | ICD-10-CM

## 2018-02-28 DIAGNOSIS — J4530 Mild persistent asthma, uncomplicated: Secondary | ICD-10-CM | POA: Diagnosis present

## 2018-02-28 DIAGNOSIS — G9340 Encephalopathy, unspecified: Secondary | ICD-10-CM

## 2018-02-28 DIAGNOSIS — G9341 Metabolic encephalopathy: Secondary | ICD-10-CM | POA: Diagnosis not present

## 2018-02-28 LAB — CBC WITH DIFFERENTIAL/PLATELET
Abs Immature Granulocytes: 0.7 10*3/uL — ABNORMAL HIGH (ref 0.00–0.07)
Basophils Absolute: 0 10*3/uL (ref 0.0–0.1)
Basophils Relative: 0 %
Eosinophils Absolute: 0 10*3/uL (ref 0.0–0.5)
Eosinophils Relative: 0 %
HCT: 45.7 % (ref 39.0–52.0)
Hemoglobin: 14.8 g/dL (ref 13.0–17.0)
Lymphocytes Relative: 0 %
Lymphs Abs: 0 10*3/uL — ABNORMAL LOW (ref 0.7–4.0)
MCH: 30.3 pg (ref 26.0–34.0)
MCHC: 32.4 g/dL (ref 30.0–36.0)
MCV: 93.6 fL (ref 80.0–100.0)
Metamyelocytes Relative: 2 %
Monocytes Absolute: 1.6 10*3/uL — ABNORMAL HIGH (ref 0.1–1.0)
Monocytes Relative: 7 %
Myelocytes: 1 %
NRBC: 0 % (ref 0.0–0.2)
Neutro Abs: 21 10*3/uL — ABNORMAL HIGH (ref 1.7–7.7)
Neutrophils Relative %: 90 %
PLATELETS: 152 10*3/uL (ref 150–400)
RBC: 4.88 MIL/uL (ref 4.22–5.81)
RDW: 14.7 % (ref 11.5–15.5)
SMEAR REVIEW: NORMAL
WBC: 23.3 10*3/uL — ABNORMAL HIGH (ref 4.0–10.5)

## 2018-02-28 LAB — URINALYSIS, COMPLETE (UACMP) WITH MICROSCOPIC
Bacteria, UA: NONE SEEN
Bilirubin Urine: NEGATIVE
Glucose, UA: NEGATIVE mg/dL
Hgb urine dipstick: NEGATIVE
Ketones, ur: NEGATIVE mg/dL
Leukocytes, UA: NEGATIVE
Nitrite: NEGATIVE
Protein, ur: NEGATIVE mg/dL
SPECIFIC GRAVITY, URINE: 1.018 (ref 1.005–1.030)
pH: 5 (ref 5.0–8.0)

## 2018-02-28 LAB — COMPREHENSIVE METABOLIC PANEL
ALT: 17 U/L (ref 0–44)
ANION GAP: 14 (ref 5–15)
AST: 20 U/L (ref 15–41)
Albumin: 2.5 g/dL — ABNORMAL LOW (ref 3.5–5.0)
Alkaline Phosphatase: 92 U/L (ref 38–126)
BUN: 115 mg/dL — ABNORMAL HIGH (ref 6–20)
CO2: 30 mmol/L (ref 22–32)
Calcium: 8.2 mg/dL — ABNORMAL LOW (ref 8.9–10.3)
Chloride: 84 mmol/L — ABNORMAL LOW (ref 98–111)
Creatinine, Ser: 7.47 mg/dL — ABNORMAL HIGH (ref 0.61–1.24)
GFR calc non Af Amer: 8 mL/min — ABNORMAL LOW (ref >60)
GFR, EST AFRICAN AMERICAN: 9 mL/min — AB (ref >60)
Glucose, Bld: 188 mg/dL — ABNORMAL HIGH (ref 70–99)
Potassium: 5.3 mmol/L — ABNORMAL HIGH (ref 3.5–5.1)
Sodium: 128 mmol/L — ABNORMAL LOW (ref 135–145)
Total Bilirubin: 1.3 mg/dL — ABNORMAL HIGH (ref 0.3–1.2)
Total Protein: 7.1 g/dL (ref 6.5–8.1)

## 2018-02-28 LAB — PROTIME-INR
INR: 1.2
Prothrombin Time: 15.1 seconds (ref 11.4–15.2)

## 2018-02-28 LAB — INFLUENZA PANEL BY PCR (TYPE A & B)
Influenza A By PCR: NEGATIVE
Influenza B By PCR: NEGATIVE

## 2018-02-28 LAB — URINE DRUG SCREEN, QUALITATIVE (ARMC ONLY)
Amphetamines, Ur Screen: NOT DETECTED
Barbiturates, Ur Screen: NOT DETECTED
Benzodiazepine, Ur Scrn: NOT DETECTED
CANNABINOID 50 NG, UR ~~LOC~~: NOT DETECTED
Cocaine Metabolite,Ur ~~LOC~~: NOT DETECTED
MDMA (Ecstasy)Ur Screen: NOT DETECTED
Methadone Scn, Ur: NOT DETECTED
Opiate, Ur Screen: NOT DETECTED
Phencyclidine (PCP) Ur S: NOT DETECTED
Tricyclic, Ur Screen: NOT DETECTED

## 2018-02-28 LAB — LACTIC ACID, PLASMA
LACTIC ACID, VENOUS: 2.1 mmol/L — AB (ref 0.5–1.9)
Lactic Acid, Venous: 1.7 mmol/L (ref 0.5–1.9)

## 2018-02-28 LAB — ETHANOL

## 2018-02-28 LAB — GLUCOSE, CAPILLARY
GLUCOSE-CAPILLARY: 199 mg/dL — AB (ref 70–99)
Glucose-Capillary: 185 mg/dL — ABNORMAL HIGH (ref 70–99)
Glucose-Capillary: 178 mg/dL — ABNORMAL HIGH (ref 70–99)
Glucose-Capillary: 233 mg/dL — ABNORMAL HIGH (ref 70–99)

## 2018-02-28 LAB — HEMOGLOBIN A1C
Hgb A1c MFr Bld: 8.1 % — ABNORMAL HIGH (ref 4.8–5.6)
Mean Plasma Glucose: 185.77 mg/dL

## 2018-02-28 LAB — MRSA PCR SCREENING: MRSA by PCR: NEGATIVE

## 2018-02-28 LAB — TROPONIN I: Troponin I: <0.03 ng/mL (ref <0.03)

## 2018-02-28 LAB — STREP PNEUMONIAE URINARY ANTIGEN: Strep Pneumo Urinary Antigen: NEGATIVE

## 2018-02-28 MED ORDER — SODIUM CHLORIDE 0.9% FLUSH: 3.0000 mL | Freq: Two times a day (BID) | INTRAVENOUS | Status: DC

## 2018-02-28 MED ORDER — SODIUM CHLORIDE 0.9 % IV SOLN: Freq: Once | INTRAVENOUS | Status: DC

## 2018-02-28 MED ORDER — HEPARIN SODIUM (PORCINE) 5000 UNIT/ML IJ SOLN
5000.0000 [IU] | Freq: Three times a day (TID) | INTRAMUSCULAR | Status: DC
  Administered 2018-02-28 – 2018-03-04 (×12): 5000 [IU] via SUBCUTANEOUS
  Filled 2018-02-28 (×13): qty 1

## 2018-02-28 MED ORDER — INSULIN ASPART 100 UNIT/ML ~~LOC~~ SOLN
0.0000 [IU] | Freq: Three times a day (TID) | SUBCUTANEOUS | Status: DC
  Administered 2018-02-28 (×2): 2 [IU] via SUBCUTANEOUS
  Administered 2018-03-01: 7 [IU] via SUBCUTANEOUS
  Administered 2018-03-01: 5 [IU] via SUBCUTANEOUS
  Administered 2018-03-01: 7 [IU] via SUBCUTANEOUS
  Administered 2018-03-02: 3 [IU] via SUBCUTANEOUS
  Administered 2018-03-02: 7 [IU] via SUBCUTANEOUS
  Filled 2018-02-28 (×7): qty 1

## 2018-02-28 MED ORDER — METHYLPREDNISOLONE SODIUM SUCC 125 MG IJ SOLR
60.0000 mg | INTRAMUSCULAR | Status: DC
  Administered 2018-03-01 – 2018-03-03 (×3): 60 mg via INTRAVENOUS
  Filled 2018-02-28 (×3): qty 2

## 2018-02-28 MED ORDER — IPRATROPIUM-ALBUTEROL 0.5-2.5 (3) MG/3ML IN SOLN
3.0000 mL | Freq: Once | RESPIRATORY_TRACT | Status: AC
  Administered 2018-02-28: 3 mL via RESPIRATORY_TRACT
  Filled 2018-02-28: qty 3

## 2018-02-28 MED ORDER — SODIUM CHLORIDE 0.9 % IV SOLN
2.0000 g | INTRAVENOUS | Status: DC
  Administered 2018-02-28 – 2018-03-05 (×6): 2 g via INTRAVENOUS
  Filled 2018-02-28 (×5): qty 2
  Filled 2018-02-28: qty 20

## 2018-02-28 MED ORDER — LACTATED RINGERS IV SOLN
INTRAVENOUS | Status: DC
  Administered 2018-02-28 – 2018-03-02 (×3): via INTRAVENOUS

## 2018-02-28 MED ORDER — SODIUM CHLORIDE 0.9 % IV SOLN: INTRAVENOUS | Status: DC

## 2018-02-28 MED ORDER — ALBUTEROL SULFATE (2.5 MG/3ML) 0.083% IN NEBU: 2.5000 mg | INHALATION_SOLUTION | RESPIRATORY_TRACT | Status: DC | PRN

## 2018-02-28 MED ORDER — LACTATED RINGERS IV BOLUS: 1000.0000 mL | Freq: Once | INTRAVENOUS | Status: DC

## 2018-02-28 MED ORDER — SODIUM CHLORIDE 0.9 % IV SOLN
500.0000 mg | INTRAVENOUS | Status: DC
  Administered 2018-02-28 – 2018-03-01 (×2): 500 mg via INTRAVENOUS
  Filled 2018-02-28 (×2): qty 500

## 2018-02-28 MED ORDER — SODIUM CHLORIDE 0.9 % IV BOLUS
1000.0000 mL | Freq: Once | INTRAVENOUS | Status: AC
  Administered 2018-02-28: 1000 mL via INTRAVENOUS

## 2018-02-28 MED ORDER — ACETAMINOPHEN 325 MG PO TABS
650.0000 mg | ORAL_TABLET | Freq: Four times a day (QID) | ORAL | Status: DC | PRN
  Administered 2018-03-05: 07:00:00 650 mg via ORAL
  Filled 2018-02-28: qty 2

## 2018-02-28 MED ORDER — ONDANSETRON HCL 4 MG PO TABS: 4.0000 mg | ORAL_TABLET | Freq: Four times a day (QID) | ORAL | Status: DC | PRN

## 2018-02-28 MED ORDER — SODIUM CHLORIDE 0.9 % IV SOLN
Freq: Once | INTRAVENOUS | Status: AC
  Administered 2018-02-28: 11:00:00 via INTRAVENOUS

## 2018-02-28 MED ORDER — SODIUM CHLORIDE 0.9 % IV BOLUS
500.0000 mL | Freq: Once | INTRAVENOUS | Status: AC
  Administered 2018-02-28: 500 mL via INTRAVENOUS

## 2018-02-28 MED ORDER — NOREPINEPHRINE-SODIUM CHLORIDE 4-0.9 MG/250ML-% IV SOLN
0.0000 ug/min | INTRAVENOUS | Status: DC
  Administered 2018-02-28: 2 ug/min via INTRAVENOUS
  Filled 2018-02-28: qty 250

## 2018-02-28 MED ORDER — NOREPINEPHRINE 16 MG/250ML-% IV SOLN
0.0000 ug/min | INTRAVENOUS | Status: DC
  Administered 2018-02-28 (×2): 40 ug/min via INTRAVENOUS
  Administered 2018-03-01: 18 ug/min via INTRAVENOUS
  Filled 2018-02-28 (×4): qty 250

## 2018-02-28 MED ORDER — LACTATED RINGERS IV BOLUS
1000.0000 mL | Freq: Once | INTRAVENOUS | Status: AC
  Administered 2018-02-28: 1000 mL via INTRAVENOUS

## 2018-02-28 MED ORDER — ONDANSETRON HCL 4 MG/2ML IJ SOLN: 4.0000 mg | Freq: Four times a day (QID) | INTRAMUSCULAR | Status: DC | PRN

## 2018-02-28 MED ORDER — SODIUM CHLORIDE 0.9 % IV BOLUS
250.0000 mL | Freq: Once | INTRAVENOUS | Status: AC
  Administered 2018-02-28: 250 mL via INTRAVENOUS

## 2018-02-28 MED ORDER — IPRATROPIUM-ALBUTEROL 0.5-2.5 (3) MG/3ML IN SOLN
3.0000 mL | Freq: Four times a day (QID) | RESPIRATORY_TRACT | Status: DC
  Administered 2018-02-28 – 2018-03-05 (×17): 3 mL via RESPIRATORY_TRACT
  Filled 2018-02-28 (×17): qty 3

## 2018-02-28 MED ORDER — INSULIN ASPART 100 UNIT/ML ~~LOC~~ SOLN
0.0000 [IU] | Freq: Every day | SUBCUTANEOUS | Status: DC
  Administered 2018-02-28: 2 [IU] via SUBCUTANEOUS
  Administered 2018-03-01: 4 [IU] via SUBCUTANEOUS
  Filled 2018-02-28 (×2): qty 1

## 2018-02-28 MED ORDER — METHYLPREDNISOLONE SODIUM SUCC 125 MG IJ SOLR
60.0000 mg | INTRAMUSCULAR | Status: DC
  Administered 2018-02-28: 60 mg via INTRAVENOUS
  Filled 2018-02-28: qty 2

## 2018-02-28 MED ORDER — VASOPRESSIN 20 UNIT/ML IV SOLN
0.0300 [IU]/min | INTRAVENOUS | Status: DC
  Administered 2018-02-28 – 2018-03-02 (×2): 0.03 [IU]/min via INTRAVENOUS
  Filled 2018-02-28 (×2): qty 2

## 2018-02-28 MED ORDER — POLYETHYLENE GLYCOL 3350 17 G PO PACK: 17.0000 g | PACK | Freq: Every day | ORAL | Status: DC | PRN

## 2018-02-28 MED ORDER — ACETAMINOPHEN 650 MG RE SUPP: 650.0000 mg | Freq: Four times a day (QID) | RECTAL | Status: DC | PRN

## 2018-02-28 NOTE — ED Notes (Signed)
CCU notified pt en route to room, transported by [employer]

## 2018-02-28 NOTE — ED Notes (Signed)
Attempted to give report at this time, per secretary of receiving floor staff have been unable to look at pt chart at this time and will not be able to take report at this time. Charge RN aware

## 2018-02-28 NOTE — Consult Note (Signed)
Critical Care Medicine Consultation          Date: 02/28/2018,   MRN# [id number] [name] 07-Aug-1965 Code Status:     Code Status Orders  (From admission, onward)         Start     Ordered   02/28/18 1059  Full code  Continuous     02/28/18 1059        Code Status History    This patient has a current code status but no historical code status.        CHIEF COMPLAINT:   Lethargy and weakness   HISTORY OF PRESENT ILLNESS   [name]  is a 53 y.o. male with a known history of insulin-dependent diabetes mellitus, hypertension, CKD stage III, chronic diastolic congestive heart failure with ejection fraction 50%, tobacco use, daily alcohol use presents to the emergency room brought in by her friend due to weakness, lightheaded, shortness of breath and cough.  Poor historian.  Was able to get more details from his brother and his mother [name] at the bedside.   Falls back to sleep easily.  Seems to be oriented while having a conversation. Patient was found to be hypotensive with systolic blood pressure in the 70s and has received 750 mL normal saline to this point.  Saturations in the 80s and needing 2 L oxygen.  Found to have acute kidney injury with creatinine of 7, leukocytosis, right lower lobe pneumonia on chest x-ray.  Patient is being admitted to the stepdown unit with severe sepsis, pneumonia.  Came up from the ED under vasopressors with low map status post 2 L fluid.   PAST MEDICAL HISTORY   Past Medical History:  Diagnosis Date  . Allergy   . Asthma   . Gout   . Hypertension   . Morbid obesity ([place])      SURGICAL HISTORY   History reviewed. No pertinent surgical history.   FAMILY HISTORY   Family History  Problem Relation Age of Onset  . Diabetes Mother   . Hypertension Mother   . Alcohol abuse Father   . Glaucoma Father   . Diabetes Father   . Hypertension Father   . Cancer Neg Hx   . COPD Neg Hx   . Heart disease Neg Hx   .  Stroke Neg Hx      SOCIAL HISTORY   Social History   Tobacco Use  . Smoking status: Never Smoker  . Smokeless tobacco: Never Used  Substance Use Topics  . Alcohol use: Yes    Comment: a glass of wine and a couple beers a day  . Drug use: No     MEDICATIONS    Home Medication: as per [facility]   Current Medication:  Current Facility-Administered Medications:  .  acetaminophen (TYLENOL) tablet 650 mg, 650 mg, Oral, Q6H PRN **OR** acetaminophen (TYLENOL) suppository 650 mg, 650 mg, Rectal, Q6H PRN, Sudini, Srikar, MD .  albuterol (PROVENTIL) (2.5 MG/3ML) 0.083% nebulizer solution 2.5 mg, 2.5 mg, Nebulization, Q2H PRN, Sudini, Srikar, MD .  azithromycin (ZITHROMAX) 500 mg in sodium chloride 0.9 % 250 mL IVPB, 500 mg, Intravenous, Q24H, Merlyn Lot, MD, Stopped at 02/28/18 1148 .  cefTRIAXone (ROCEPHIN) 2 g in sodium chloride 0.9 % 100 mL IVPB, 2 g, Intravenous, Q24H, Merlyn Lot, MD, Stopped at 02/28/18 1048 .  heparin injection 5,000 Units, 5,000 Units, Subcutaneous, Q8H, Hillary Bow, MD, 5,000 Units at 02/28/18 1627 .  insulin aspart (novoLOG) injection  0-5 Units, 0-5 Units, Subcutaneous, QHS, Sudini, Srikar, MD .  insulin aspart (novoLOG) injection 0-9 Units, 0-9 Units, Subcutaneous, TID WC, Hillary Bow, MD, 2 Units at 02/28/18 1719 .  lactated ringers bolus 1,000 mL, 1,000 mL, Intravenous, Once, Ottie Glazier, MD .  Derrill Memo ON 03/01/2018] methylPREDNISolone sodium succinate (SOLU-MEDROL) 125 mg/2 mL injection 60 mg, 60 mg, Intravenous, Q24H, Charlett Nose, RPH .  norepinephrine (LEVOPHED) 16 mg in D5W 224mL premix infusion, 0-40 mcg/min, Intravenous, Titrated, Jyaire Koudelka, MD, Last Rate: 37.5 mL/hr at 02/28/18 1354, 40 mcg/min at 02/28/18 1354 .  ondansetron (ZOFRAN) tablet 4 mg, 4 mg, Oral, Q6H PRN **OR** ondansetron (ZOFRAN) injection 4 mg, 4 mg, Intravenous, Q6H PRN, Sudini, Srikar, MD .  polyethylene glycol (MIRALAX / GLYCOLAX) packet 17 g, 17 g, Oral,  Daily PRN, Hillary Bow, MD    ALLERGIES   Patient has no known allergies.     REVIEW OF SYSTEMS    Review of Systems:  Gen:  Denies  fever, sweats, chills weigh loss  HEENT: Denies blurred vision, double vision, ear pain, eye pain, hearing loss, nose bleeds, sore throat Cardiac:  No dizziness, chest pain or heaviness, chest tightness,edema Resp:   Denies cough or sputum porduction, shortness of breath,wheezing, hemoptysis,  Gi: Denies swallowing difficulty, stomach pain, nausea or vomiting, diarrhea, constipation, bowel incontinence Gu:  Denies bladder incontinence, burning urine Ext:   Denies Joint pain, stiffness or swelling Skin: Denies  skin rash, easy bruising or bleeding or hives Endoc:  Denies polyuria, polydipsia , polyphagia or weight change Psych:   Denies depression, insomnia or hallucinations   Other:  All other systems negative   VS: BP (!) 85/47 (BP Location: Left Arm)   Pulse 87   Temp 97.7 F (36.5 C) (Oral)   Resp 20   Ht 5\' 11"  (1.803 m)   Wt (!) 168.3 kg   SpO2 98%   BMI 51.75 kg/m      PHYSICAL EXAM  Physical Examination:   GENERAL:NAD, no fevers, chills, no weakness no fatigue HEAD: Normocephalic, atraumatic.  EYES: Pupils equal, round, reactive to light. Extraocular muscles intact. No scleral icterus.  MOUTH: Moist mucosal membrane. Dentition intact. No abscess noted.  EAR, NOSE, THROAT: Clear without exudates. No external lesions.  NECK: Supple. No thyromegaly. No nodules. No JVD.  PULMONARY: Diffuse coarse rhonchi right sided +wheezes CARDIOVASCULAR: S1 and S2. Regular rate and rhythm. No murmurs, rubs, or gallops. No edema. Pedal pulses 2+ bilaterally.  GASTROINTESTINAL: Soft, nontender, nondistended. No masses. Positive bowel sounds. No hepatosplenomegaly.  MUSCULOSKELETAL: No swelling, clubbing, or edema. Range of motion full in all extremities.  NEUROLOGIC: Cranial nerves II through XII are intact. No gross focal neurological  deficits. Sensation intact. Reflexes intact.  SKIN: No ulceration, lesions, rashes, or cyanosis. Skin warm and dry. Turgor intact.  PSYCHIATRIC: Mood, affect within normal limits. The patient is awake, alert and oriented x 3. Insight, judgment intact.  ALL OTHER ROS ARE NEGATIVE     IMAGING       US Renal  Result Date: 02/28/2018 CLINICAL DATA:  Acute kidney injury. EXAM: RENAL / URINARY TRACT ULTRASOUND COMPLETE COMPARISON:  None. FINDINGS: Right Kidney: Renal measurements: 11.4 x 6.2 x 7.5 cm = volume: 279.4 mL. Increased echogenicity. No mass or hydronephrosis. Left Kidney: Renal measurements: 10.3 x 7.1 x 6.9 cm = volume: 265.2 mL. Increased echogenicity. No mass or hydronephrosis. Bladder: Unable to visualize. IMPRESSION: Normal renal volume. Increased echogenicity. No mass or hydronephrosis. Electronically Signed   By:  Staci Righter M.D.   On: 02/28/2018 10:51   Dg Chest Port 1 View  Result Date: 02/28/2018 CLINICAL DATA:  PICC line placement EXAM: PORTABLE CHEST 1 VIEW COMPARISON:  Portable exam 6041 hours compared to 02/28/2018 at 1441 hours FINDINGS: Tip of RIGHT arm PICC line is again identified at the costal margin at the RIGHT subclavian vein. Enlargement of cardiac silhouette with pulmonary vascular congestion. Bibasilar opacities likely representing a combination of atelectasis and consolidation. Upper lungs clear. No pneumothorax. IMPRESSION: Unchanged position of the tip of the RIGHT arm PICC line at the RIGHT subclavian vein Consider advancing catheter 13-15 cm to place in SVC. Persistent bibasilar opacities. Electronically Signed   By: Lavonia Dana M.D.   On: 02/28/2018 17:14   Dg Chest Port 1 View  Result Date: 02/28/2018 CLINICAL DATA:  PICC placement. EXAM: PORTABLE CHEST 1 VIEW COMPARISON:  Earlier today. FINDINGS: Stable enlarged cardiac silhouette. Less prominent mass-like density in the right lower lung zone. Stable prominence of the pulmonary vasculature and mild  prominence of the interstitial markings. No visible pleural fluid. The right PICC tip is in the right subclavian vein. This would need to be advanced approximately 15 cm to place it at the superior cavoatrial junction. Thoracic spine degenerative changes. IMPRESSION: 1. Right PICC tip in the right subclavian vein. It would need to be advanced approximately 15 cm to place it at the superior cavoatrial junction. 2. Left prominent mass or mass-like consolidation in the right lower lung zone. 3. Stable cardiomegaly, pulmonary vascular congestion and mild chronic interstitial lung disease. Electronically Signed   By: Claudie Revering M.D.   On: 02/28/2018 15:04   Dg Chest Portable 1 View  Result Date: 02/28/2018 CLINICAL DATA:  Weakness this morning. History of asthma, hypertension and congestive heart failure. Evaluate for edema, cardiomegaly, consolidation. EXAM: PORTABLE CHEST 1 VIEW COMPARISON:  Radiographs 09/03/2015 and 11/01/2011. FINDINGS: 0922 hours. Stable cardiomegaly and chronic vascular congestion. There is no overt pulmonary edema. At the right lung base, there is consolidation or an ill-defined mass without obscuration of the right heart border or right hemidiaphragm. The left lung appears clear. There is no pleural effusion or pneumothorax. IMPRESSION: Right basilar infiltrate or mass. This could reflect pneumonia. Followup PA and lateral chest X-ray is recommended in 3-4 weeks following trial of antibiotic therapy to ensure resolution and exclude underlying malignancy. Electronically Signed   By: Richardean Sale M.D.   On: 02/28/2018 09:41      ASSESSMENT/PLAN   Septic shock -Due to bilateral lower lobe pneumonia -Blood cultures pending -Status post 2 L fluids, 1 L of LR in process -Requiring maximum levo fed support -Discussed case with family at bedside -Procalcitonin pending -Leukocytosis at 23,000 and elevated lactate >2   Heart failure with reduced EF -Conservative fluid  strategy Hold Lasix while patient is septic and shock -Transthoracic echo pending  Mild persistent asthma -Albuterol nebs every 4 hours while awake  Morbid obesity   GI DVT prophylaxis -Protonix, SCDs     Critical Care Time devoted to patient care services described in this note is 60 minutes.     Overall, patient is critically ill, prognosis is guarded.  Patient with Multiorgan failure and at high risk for cardiac arrest and death.     Patient/Family are satisfied with Plan of action and management. All questions answered      Ottie Glazier, MD Division of Pulmonary and Critical Care Medicine 5:37 PM 02/28/2018

## 2018-02-28 NOTE — ED Notes (Signed)
First Nurse Note: Pulse ox - 84% on RA, patient complaining of weakness.  BP 74/41, Pulse ox up to 95% on oxygen.  Taken to Rm 2, report to [place] RN.  Patient alert and oriented, skin warm and dry.  Hx of DM - has not taken BS this AM.

## 2018-02-28 NOTE — H&P (Addendum)
[facility] [place] NAME: [name]    MR#:  [id number]  DATE OF BIRTH:  [birth year]  DATE OF ADMISSION:  02/28/2018  PRIMARY CARE PHYSICIAN: [name], DO   REQUESTING/REFERRING PHYSICIAN: Dr. Quentin Cornwall  CHIEF COMPLAINT:   Chief Complaint  Patient presents with  . Hypotension    HISTORY OF PRESENT ILLNESS:  [name]  is a 53 y.o. male with a known history of insulin-dependent diabetes mellitus, hypertension, CKD stage III, chronic diastolic congestive heart failure with ejection fraction 50%, tobacco use, daily alcohol use presents to the emergency room brought in by her friend due to weakness, lightheaded, shortness of breath and cough.  Poor historian.  Falls back to sleep easily.  Seems to be oriented while having a conversation. Patient was found to be hypotensive with systolic blood pressure in the 70s and has received 750 mL normal saline to this point.  Saturations in the 80s and needing 2 L oxygen.  Found to have acute kidney injury with creatinine of 7, leukocytosis, right lower lobe pneumonia on chest x-ray.  Patient is being admitted to the stepdown unit with severe sepsis, pneumonia.  PAST MEDICAL HISTORY:   Past Medical History:  Diagnosis Date  . Allergy   . Asthma   . Gout   . Hypertension   . Morbid obesity ([place])   Chronic diastolic congestive heart failure  PAST SURGICAL HISTORY:  History reviewed. No pertinent surgical history.  SOCIAL HISTORY:   Social History   Tobacco Use  . Smoking status: Never Smoker  . Smokeless tobacco: Never Used  Substance Use Topics  . Alcohol use: Yes    Comment: a glass of wine and a couple beers a day    FAMILY HISTORY:   Family History  Problem Relation Age of Onset  . Diabetes Mother   . Hypertension Mother   . Alcohol abuse Father   . Glaucoma Father   . Diabetes Father   . Hypertension Father   . Cancer Neg Hx   . COPD Neg Hx   . Heart  disease Neg Hx   . Stroke Neg Hx     DRUG ALLERGIES:  No Known Allergies  REVIEW OF SYSTEMS:   Review of Systems  Unable to perform ROS: Mental status change    MEDICATIONS AT HOME:   Prior to Admission medications   Medication Sig Start Date End Date Taking? Authorizing Provider  albuterol (PROVENTIL) (2.5 MG/3ML) 0.083% nebulizer solution Take 3 mLs (2.5 mg total) by nebulization every 6 (six) hours as needed for wheezing or shortness of breath. 10/10/15  Yes Krebs, Amy Lauren, NP  albuterol (VENTOLIN HFA) 108 (90 Base) MCG/ACT inhaler Inhale 2 puffs into the lungs every 4 (four) hours as needed for wheezing or shortness of breath. 02/06/15  Yes [name], [name], MD  allopurinol (ZYLOPRIM) 100 MG tablet Take 1 tablet (100 mg total) by mouth daily. 12/07/17  Yes [name], [name], DO  aspirin 81 MG chewable tablet Chew 81 mg by mouth daily. 02/15/15  Yes [provider]  atorvastatin (LIPITOR) 20 MG tablet Take 1 tablet (20 mg total) by mouth daily. 01/21/17  Yes [name], [name], DO  carvedilol (COREG) 25 MG tablet Take 1 tablet (25 mg total) by mouth 2 (two) times daily with a meal. Patient taking differently: Take 50 mg by mouth 2 (two) times daily with a meal.  06/15/17  Yes [name], [name], DO  gabapentin (NEURONTIN)  100 MG capsule Start 1 capsule daily, increase by 1 cap every 2-3 days as tolerated up to 1-3 capsules per dose 2 times a day 02/01/18  Yes [name], [name], DO  Insulin Glargine (BASAGLAR KWIKPEN) 100 UNIT/ML SOPN Inject 10 Units into the skin daily. Submitted to [employer] for assistance 01/11/18  Yes [name], [name], DO  Insulin Pen Needle (B-D UF III MINI PEN NEEDLES) 31G X 5 MM MISC 1 each by Does not apply route daily. Use once daily with Victoza. 03/11/16  Yes [name], [name], DO  lisinopril (PRINIVIL,ZESTRIL) 2.5 MG tablet Take 1 tablet (2.5 mg total) by mouth daily. 12/02/17  Yes [name], [name], DO   loratadine (CLARITIN) 10 MG tablet Take 1 tablet (10 mg total) by mouth daily. 06/15/17  Yes [name], [name], DO  metolazone (ZAROXOLYN) 5 MG tablet Take 5 mg by mouth daily.  04/20/16  Yes [provider]  omeprazole (PRILOSEC) 40 MG capsule Take 1 capsule (40 mg total) by mouth daily. 06/15/17  Yes [name], [name], DO  ranitidine (ZANTAC) 150 MG tablet Take 1 tablet (150 mg total) by mouth daily before breakfast. 06/15/17  Yes [name], [name], DO  traMADol (ULTRAM) 50 MG tablet Take 1-2 tablets (50-100 mg total) by mouth every 12 (twelve) hours as needed. 06/15/17  Yes [name], [name], DO  vitamin C (ASCORBIC ACID) 500 MG tablet Take 1,000 mg by mouth daily. 04/29/11  Yes [provider]  Zinc 50 MG CAPS Take 50 mg by mouth daily.   Yes [provider]  blood glucose meter kit and supplies [employer] Brand of choice; LON 99 months; check FSBS 3x a day; E11.65; disp one meter with 100 strips + 1 refill of strips 01/17/15   [name], Melinda P, MD  bumetanide (BUMEX) 1 MG tablet Take 1 mg by mouth daily.  04/13/16 04/13/17  [provider]     VITAL SIGNS:  Blood pressure (!) 76/53, pulse 83, temperature 97.8 F (36.6 C), temperature source Oral, resp. rate 16, SpO2 97 %.  PHYSICAL EXAMINATION:  Physical Exam  GENERAL:  53 y.o.-year-old patient lying in the bed .  Drowsy.  Looks critically ill EYES: Pupils equal, round, reactive to light and accommodation. No scleral icterus. Extraocular muscles intact.  HEENT: Head atraumatic, normocephalic. Oropharynx and nasopharynx clear. No oropharyngeal erythema, moist oral mucosa  NECK:  Supple, no jugular venous distention. No thyroid enlargement, no tenderness.  LUNGS: Bilateral wheezing and crackles.  Decreased air entry. CARDIOVASCULAR: S1, S2 normal. No murmurs, rubs, or gallops.  ABDOMEN: Soft, nontender, nondistended. Bowel sounds present. No organomegaly or mass.  EXTREMITIES: Lower  extremity edema.  No cyanosis. NEUROLOGIC: Cranial nerves II through XII are intact.  Moves all 4 extremities symmetrically PSYCHIATRIC: The patient is drowsy.  Oriented SKIN: No obvious rash, lesion, or ulcer.   LABORATORY PANEL:   CBC Recent Labs  Lab 02/28/18 0909  WBC 23.3*  HGB 14.8  HCT 45.7  PLT 152   ------------------------------------------------------------------------------------------------------------------  Chemistries  Recent Labs  Lab 02/28/18 0909  NA 128*  K 5.3*  CL 84*  CO2 30  GLUCOSE 188*  BUN 115*  CREATININE 7.47*  CALCIUM 8.2*  AST 20  ALT 17  ALKPHOS 92  BILITOT 1.3*   ------------------------------------------------------------------------------------------------------------------  Cardiac Enzymes Recent Labs  Lab 02/28/18 0909  TROPONINI <0.03   ------------------------------------------------------------------------------------------------------------------  RADIOLOGY:  US Renal  Result Date: 02/28/2018 CLINICAL DATA:  Acute kidney injury. EXAM: RENAL / URINARY TRACT ULTRASOUND  COMPLETE COMPARISON:  None. FINDINGS: Right Kidney: Renal measurements: 11.4 x 6.2 x 7.5 cm = volume: 279.4 mL. Increased echogenicity. No mass or hydronephrosis. Left Kidney: Renal measurements: 10.3 x 7.1 x 6.9 cm = volume: 265.2 mL. Increased echogenicity. No mass or hydronephrosis. Bladder: Unable to visualize. IMPRESSION: Normal renal volume. Increased echogenicity. No mass or hydronephrosis. Electronically Signed   By: Staci Righter M.D.   On: 02/28/2018 10:51   Dg Chest Portable 1 View  Result Date: 02/28/2018 CLINICAL DATA:  Weakness this morning. History of asthma, hypertension and congestive heart failure. Evaluate for edema, cardiomegaly, consolidation. EXAM: PORTABLE CHEST 1 VIEW COMPARISON:  Radiographs 09/03/2015 and 11/01/2011. FINDINGS: 0922 hours. Stable cardiomegaly and chronic vascular congestion. There is no overt pulmonary edema. At the right  lung base, there is consolidation or an ill-defined mass without obscuration of the right heart border or right hemidiaphragm. The left lung appears clear. There is no pleural effusion or pneumothorax. IMPRESSION: Right basilar infiltrate or mass. This could reflect pneumonia. Followup PA and lateral chest X-ray is recommended in 3-4 weeks following trial of antibiotic therapy to ensure resolution and exclude underlying malignancy. Electronically Signed   By: Richardean Sale M.D.   On: 02/28/2018 09:41   IMPRESSION AND PLAN:   *Severe sepsis with right lower lobe pneumonia and acute hypoxic respiratory failure IV fluid resuscitation with caution due to history of CHF.  Repeat lactic acid.  Admit to stepdown unit.  May need pressor support if blood pressure does not improve.  Oxygen to keep saturations greater than 90%.  Blood cultures.  Will order sputum cultures.  Urine strep pneumonia antigen.  IV ceftriaxone and azithromycin.  Consult intensivist.  *Acute kidney injury over CKD stage IV.  Due to being on diuretics and severe sepsis with ATN.  Hypotension present..  Renal ultrasound has been ordered and pending.  Dr. Zollie Scale with nephrology has been informed of the consult from ER.  Monitor closely of input and output.  Continue IV fluids.  Should improve as his blood pressure improves.  *Diabetes mellitus.  Sliding scale insulin added.  Diabetic diet.  *Acute metabolic encephalopathy due to acute kidney injury.  Slowly improving from the time he has arrived in the emergency room.  Urine drug screen pending.  Alcohol level negative  *Chronic diastolic congestive heart failure.  Needs fluid resuscitation at this time.  Hold diuretics.  *Hyperkalemia secondary to acute kidney injury.  Should improve with IV fluids.  *Hypovolemic hyponatremia.  Will repeat labs after IV fluids.  DVT prophylaxis with heparin  All the records are reviewed and case discussed with ED provider. Management plans  discussed with the patient, family and they are in agreement.  CODE STATUS: FULL CODE  TOTAL TIME TAKING CARE OF THIS PATIENT: 40 minutes.   Leia Alf Ishmeal Rorie M.D on 02/28/2018 at 11:03 AM  Between 7am to 6pm - Pager - [phone]  After 6pm go to [email or web address] - password [email or web address] [facility]  Office  [phone]  CC: Primary care physician; [name], DO  Note: This dictation was prepared with Dragon dictation along with smaller phrase technology. Any transcriptional errors that result from this process are unintentional.

## 2018-02-28 NOTE — ED Notes (Signed)
Pt aware of urine sample needed, unable to void at this time

## 2018-02-28 NOTE — Procedures (Signed)
Central Venous Catheter Placement:   Right PICC Indication: Patient receiving vesicant or irritant drug.; Patient receiving intravenous therapy for longer than 5 days.; Patient has limited or no vascular access.   Consent: Patient was obtunded due to septic shock mother [name] signed consent.    Hand washing performed prior to starting the procedure.   Procedure:   An active timeout was performed and correct patient, name, & ID confirmed.   Patient was positioned correctly for central venous access.  Patient was prepped using strict sterile technique including chlorohexadine preps, sterile drape, sterile gown and sterile gloves.    The area was prepped, draped and anesthetized in the usual sterile manner. Patient comfort was obtained.    A triple lumen catheter was placed in axillary vein.  There was good blood return, catheter caps were placed on lumens, catheter flushed easily, the line was secured and a sterile dressing and BIO-PATCH applied.  -Catheter was meeting resistance at mid subclavian vein.  Repositioning attempted with similar resistance.   Ultrasound was used to visualize vasculature and guidance of needle.   Number of Attempts: 1 Complications:none Estimated Blood Loss: none Chest Radiograph indicated and ordered.  Operator: Kasa.    Darren Glazier MD Pulm/CC

## 2018-02-28 NOTE — ED Provider Notes (Signed)
[facility] Emergency Department Provider Note    None    (approximate)  I have reviewed the triage vital signs and the nursing notes.   HISTORY  Chief Complaint Hypotension  Level V Caveat:  Acute encephalopathy  HPI HAROUN COTHAM is a 53 y.o. male the below listed past medical history presents the ER for blurry vision lightheadedness generalized malaise.  Patient arrives to the ER hypotensive and encephalopathic.  Drowsy and appears almost intoxicated but denies any substance use.  Is having a little bit of right upper quadrant pain but unable to provide much additional history.  Patient appears ill.  After being observed in the ER blood pressure is already improving.  Will give gentle IV bolus but his encephalopathy is resolving now laying flat.    Past Medical History:  Diagnosis Date  . Allergy   . Asthma   . Gout   . Hypertension   . Morbid obesity ([place])    Family History  Problem Relation Age of Onset  . Diabetes Mother   . Hypertension Mother   . Alcohol abuse Father   . Glaucoma Father   . Diabetes Father   . Hypertension Father   . Cancer Neg Hx   . COPD Neg Hx   . Heart disease Neg Hx   . Stroke Neg Hx    History reviewed. No pertinent surgical history. Patient Active Problem List   Diagnosis Date Noted  . Pneumonia 02/28/2018  . Chronic pain syndrome 01/21/2017  . Chronic restrictive lung disease 02/19/2016  . Primary osteoarthritis involving multiple joints 01/24/2016  . Asthma 09/03/2015  . Elevated transaminase level 06/04/2015  . Excessive daytime sleepiness 05/07/2015  . GERD (gastroesophageal reflux disease) 05/07/2015  . Noncompliance 04/09/2015  . Allergic rhinitis 04/09/2015  . Hyperlipidemia associated with type 2 diabetes mellitus ([place]) 02/15/2015  . Vitamin D deficiency 01/17/2015  . Elevated CO2 level 01/17/2015  . Tobacco abuse, episodic 01/17/2015  . Insomnia 01/17/2015  . Current tobacco use 01/11/2015   . Benign hypertension 07/10/2014  . Proteinuria 07/10/2014  . Type 2 diabetes mellitus, controlled, with renal complications ([place]) 33/54/5625  . CKD (chronic kidney disease), stage IV ([place]) 07/10/2014  . Secondary renal hyperparathyroidism ([place]) 07/10/2014  . Cardiomyopathy, dilated, nonischemic ([place])   . Morbid obesity with BMI of 50.0-59.9, adult ([place])   . Gout   . Chronic systolic heart failure ([place]) 04/29/2011      Prior to Admission medications   Medication Sig Start Date End Date Taking? Authorizing Provider  albuterol (PROVENTIL) (2.5 MG/3ML) 0.083% nebulizer solution Take 3 mLs (2.5 mg total) by nebulization every 6 (six) hours as needed for wheezing or shortness of breath. 10/10/15  Yes Krebs, Amy Lauren, NP  albuterol (VENTOLIN HFA) 108 (90 Base) MCG/ACT inhaler Inhale 2 puffs into the lungs every 4 (four) hours as needed for wheezing or shortness of breath. 02/06/15  Yes [name], [name], MD  allopurinol (ZYLOPRIM) 100 MG tablet Take 1 tablet (100 mg total) by mouth daily. 12/07/17  Yes [name], [name], DO  aspirin 81 MG chewable tablet Chew 81 mg by mouth daily. 02/15/15  Yes [provider]  atorvastatin (LIPITOR) 20 MG tablet Take 1 tablet (20 mg total) by mouth daily. 01/21/17  Yes [name], [name], DO  carvedilol (COREG) 25 MG tablet Take 1 tablet (25 mg total) by mouth 2 (two) times daily with a meal. Patient taking differently: Take 50 mg by mouth 2 (two) times daily with  a meal.  06/15/17  Yes [name], [name], DO  gabapentin (NEURONTIN) 100 MG capsule Start 1 capsule daily, increase by 1 cap every 2-3 days as tolerated up to 1-3 capsules per dose 2 times a day 02/01/18  Yes [name], [name], DO  Insulin Glargine (BASAGLAR KWIKPEN) 100 UNIT/ML SOPN Inject 10 Units into the skin daily. Submitted to [employer] for assistance 01/11/18  Yes [name], [name], DO  Insulin Pen Needle (B-D UF III MINI PEN NEEDLES) 31G X 5 MM MISC 1 each  by Does not apply route daily. Use once daily with Victoza. 03/11/16  Yes [name], [name], DO  lisinopril (PRINIVIL,ZESTRIL) 2.5 MG tablet Take 1 tablet (2.5 mg total) by mouth daily. 12/02/17  Yes [name], [name], DO  loratadine (CLARITIN) 10 MG tablet Take 1 tablet (10 mg total) by mouth daily. 06/15/17  Yes [name], [name], DO  metolazone (ZAROXOLYN) 5 MG tablet Take 5 mg by mouth daily.  04/20/16  Yes [provider]  omeprazole (PRILOSEC) 40 MG capsule Take 1 capsule (40 mg total) by mouth daily. 06/15/17  Yes [name], [name], DO  ranitidine (ZANTAC) 150 MG tablet Take 1 tablet (150 mg total) by mouth daily before breakfast. 06/15/17  Yes [name], [name], DO  traMADol (ULTRAM) 50 MG tablet Take 1-2 tablets (50-100 mg total) by mouth every 12 (twelve) hours as needed. 06/15/17  Yes [name], [name], DO  vitamin C (ASCORBIC ACID) 500 MG tablet Take 1,000 mg by mouth daily. 04/29/11  Yes [provider]  Zinc 50 MG CAPS Take 50 mg by mouth daily.   Yes [provider]  blood glucose meter kit and supplies [employer] Brand of choice; LON 99 months; check FSBS 3x a day; E11.65; disp one meter with 100 strips + 1 refill of strips 01/17/15   [name], Melinda P, MD  bumetanide (BUMEX) 1 MG tablet Take 1 mg by mouth daily.  04/13/16 04/13/17  [provider]    Allergies Patient has no known allergies.    Social History Social History   Tobacco Use  . Smoking status: Never Smoker  . Smokeless tobacco: Never Used  Substance Use Topics  . Alcohol use: Yes    Comment: a glass of wine and a couple beers a day  . Drug use: No    Review of Systems Patient denies headaches, rhinorrhea, blurry vision, numbness, shortness of breath, chest pain, edema, cough, abdominal pain, nausea, vomiting, diarrhea, dysuria, fevers, rashes or hallucinations unless otherwise stated above in  HPI. ____________________________________________   PHYSICAL EXAM:  VITAL SIGNS: Vitals:   02/28/18 0924 02/28/18 1030  BP: (!) 84/41 (!) 76/53  Pulse: 84 83  Resp: 16 16  Temp:    SpO2: 99% 97%    Constitutional: drowsy, morbidly obese, dehydrated appearing  Eyes: Conjunctivae are normal.  Head: Atraumatic. Nose: No congestion/rhinnorhea. Mouth/Throat: Mucous membranes are moist.   Neck: No stridor. Painless ROM.  Cardiovascular: Normal rate, regular rhythm. Grossly normal heart sounds.  Good peripheral circulation. Respiratory: mild tachypnea with diminished right sided breathsounds Gastrointestinal: Soft and nontender. No distention. No abdominal bruits. No CVA tenderness. Genitourinary: defererd Musculoskeletal: No lower extremity tenderness nor edema.  No joint effusions. Neurologic:  Slurred and slow speech, encephalopathic but able to follow commands, just slowly.  MAE spontaneously. No gross focal neurologic deficits are appreciated. No facial droop Skin:  Skin is warm, dry and intact. No rash noted. Psychiatric: encephalopathic  ____________________________________________   LABS (all labs  ordered are listed, but only abnormal results are displayed)  Results for orders placed or performed during the hospital encounter of 02/28/18 (from the past 24 hour(s))  Glucose, capillary     Status: Abnormal   Collection Time: 02/28/18  9:04 AM  Result Value Ref Range   Glucose-Capillary 199 (H) 70 - 99 mg/dL  Troponin I - ONCE - STAT     Status: None   Collection Time: 02/28/18  9:09 AM  Result Value Ref Range   Troponin I <0.03 <0.03 ng/mL  CBC with Differential/Platelet     Status: Abnormal   Collection Time: 02/28/18  9:09 AM  Result Value Ref Range   WBC 23.3 (H) 4.0 - 10.5 K/uL   RBC 4.88 4.22 - 5.81 MIL/uL   Hemoglobin 14.8 13.0 - 17.0 g/dL   HCT 45.7 39.0 - 52.0 %   MCV 93.6 80.0 - 100.0 fL   MCH 30.3 26.0 - 34.0 pg   MCHC 32.4 30.0 - 36.0 g/dL   RDW 14.7  11.5 - 15.5 %   Platelets 152 150 - 400 K/uL   nRBC 0.0 0.0 - 0.2 %   Neutrophils Relative % 90 %   Neutro Abs 21.0 (H) 1.7 - 7.7 K/uL   Lymphocytes Relative 0 %   Lymphs Abs 0.0 (L) 0.7 - 4.0 K/uL   Monocytes Relative 7 %   Monocytes Absolute 1.6 (H) 0.1 - 1.0 K/uL   Eosinophils Relative 0 %   Eosinophils Absolute 0.0 0.0 - 0.5 K/uL   Basophils Relative 0 %   Basophils Absolute 0.0 0.0 - 0.1 K/uL   WBC Morphology TOXIC GRANULATION    Smear Review Normal platelet morphology    Metamyelocytes Relative 2 %   Myelocytes 1 %   Abs Immature Granulocytes 0.70 (H) 0.00 - 0.07 K/uL   Tear Drop Cells PRESENT   Comprehensive metabolic panel     Status: Abnormal   Collection Time: 02/28/18  9:09 AM  Result Value Ref Range   Sodium 128 (L) 135 - 145 mmol/L   Potassium 5.3 (H) 3.5 - 5.1 mmol/L   Chloride 84 (L) 98 - 111 mmol/L   CO2 30 22 - 32 mmol/L   Glucose, Bld 188 (H) 70 - 99 mg/dL   BUN 115 (H) 6 - 20 mg/dL   Creatinine, Ser 7.47 (H) 0.61 - 1.24 mg/dL   Calcium 8.2 (L) 8.9 - 10.3 mg/dL   Total Protein 7.1 6.5 - 8.1 g/dL   Albumin 2.5 (L) 3.5 - 5.0 g/dL   AST 20 15 - 41 U/L   ALT 17 0 - 44 U/L   Alkaline Phosphatase 92 38 - 126 U/L   Total Bilirubin 1.3 (H) 0.3 - 1.2 mg/dL   GFR calc non Af Amer 8 (L) >60 mL/min   GFR calc Af Amer 9 (L) >60 mL/min   Anion gap 14 5 - 15  Ethanol     Status: None   Collection Time: 02/28/18  9:09 AM  Result Value Ref Range   Alcohol, Ethyl (B) <10 <10 mg/dL  Lactic acid, plasma     Status: Abnormal   Collection Time: 02/28/18  9:09 AM  Result Value Ref Range   Lactic Acid, Venous 2.1 (HH) 0.5 - 1.9 mmol/L  Influenza panel by PCR (type A & B)     Status: None   Collection Time: 02/28/18 10:11 AM  Result Value Ref Range   Influenza A By PCR NEGATIVE NEGATIVE   Influenza B By  PCR NEGATIVE NEGATIVE   ____________________________________________  EKG My review and personal interpretation at Time: 9:04   Indication: hypotension  Rate: 85   Rhythm: sinus Axis: left Other: poor r wave progression, no stemi ____________________________________________  RADIOLOGY  I personally reviewed all radiographic images ordered to evaluate for the above acute complaints and reviewed radiology reports and findings.  These findings were personally discussed with the patient.  Please see medical record for radiology report.  ____________________________________________   PROCEDURES  Procedure(s) performed:  .Critical Care Performed by: Merlyn Lot, MD Authorized by: Merlyn Lot, MD   Critical care provider statement:    Critical care time (minutes):  40   Critical care time was exclusive of:  Separately billable procedures and treating other patients   Critical care was necessary to treat or prevent imminent or life-threatening deterioration of the following conditions:  Sepsis, renal failure and respiratory failure   Critical care was time spent personally by me on the following activities:  Development of treatment plan with patient or surrogate, discussions with consultants, evaluation of patient's response to treatment, examination of patient, obtaining history from patient or surrogate, ordering and performing treatments and interventions, ordering and review of laboratory studies, ordering and review of radiographic studies, pulse oximetry, re-evaluation of patient's condition and review of old charts      Critical Care performed: yes ____________________________________________   INITIAL IMPRESSION / ASSESSMENT AND PLAN / ED COURSE  Pertinent labs & imaging results that were available during my care of the patient were reviewed by me and considered in my medical decision making (see chart for details).   DDX: dehydration, anemia, electrolyte abn, chf, pna, sepsis  RORAN WEGNER is a 53 y.o. who presents to the ED with symptoms as described above.  Patient afebrile but hypotensive and encephalopathic as  described above.  Patient clearly critically ill with evidence of acute respiratory failure with hypoxia.  Patient placed on supplemental oxygen.  We will give IV hydration though gently given his underlying history of congestive heart failure for his hypotension.  Does appear very dehydrated however.  The patient will be placed on continuous pulse oximetry and telemetry for monitoring.  Laboratory evaluation will be sent to evaluate for the above complaints.     Clinical Course as of Mar 01 1127  Mon Feb 28, 2018  0954 Patient with evidence of acute renal failure.  Confusion likely secondary to uremic encephalopathy.  Also with hyponatremic and hypochloremic volume concentration.  Will continue with IV hydration.  Potassium borderline elevated at 5.3.  Troponin negative.  Suspect overdiuresis.  No evidence of postobstructive uropathy.   [PR]  [phone] Patient also with right basilar infiltrate on x-ray.  Unable to lie flat for CT imaging to further evaluate.  Does have cough.  Will treat for community-acquired pneumonia after obtaining cultures.   [PR]  1026 Bedside ultrasound shows rapidly collapsible IVC suggesting significant volume depletion.  Will give additional bolus of IV fluid.  Lactate only mildly abnormal.   [PR]  1036 Patient with persistent hypotension.  Continue with IV hydration.  Will reassess after this liter of IV fluid for improvement and if still hypotensive given his history of heart failure will start norepinephrine.  He saturating well on 2 L nasal cannula.  If he shows any signs of decompensation anticipate that I will proceed to intubation due to his encephalopathy.  Bladder scan shows only 88 cc in his bladder suggesting anuria.  We will continue with IV hydration medical management.  Patient will require hospitalization for further medical management.   [PR]  6237 Patient with persistent hypotension.  Will start low-dose norepinephrine for vasopressor support due to concern for  his known cardiomyopathy and congestive heart failure.   [PR]    Clinical Course User Index [PR] Merlyn Lot, MD     As part of my medical decision making, I reviewed the following data within the [place] notes reviewed and incorporated, Labs reviewed, notes from prior ED visits and  Controlled Substance Database   ____________________________________________   FINAL CLINICAL IMPRESSION(S) / ED DIAGNOSES  Final diagnoses:  Acute renal failure, unspecified acute renal failure type ([place])  Community acquired pneumonia of right lower lobe of lung ([place])  Uremia  Encephalopathy acute  Acute respiratory failure with hypoxia ([place])      NEW MEDICATIONS STARTED DURING THIS VISIT:  New Prescriptions   No medications on file     Note:  This document was prepared using Dragon voice recognition software and may include unintentional dictation errors.    Merlyn Lot, MD 02/28/18 [phone]

## 2018-02-28 NOTE — ED Notes (Signed)
89mL of urine showed for bladder scan, MD awre

## 2018-03-01 ENCOUNTER — Inpatient Hospital Stay: Payer: PPO

## 2018-03-01 ENCOUNTER — Inpatient Hospital Stay (HOSPITAL_COMMUNITY)
Admit: 2018-03-01 | Discharge: 2018-03-01 | Disposition: A | Discharge status: Home / Self Care | Payer: PPO | Attending: Internal Medicine | Admitting: Internal Medicine

## 2018-03-01 DIAGNOSIS — I5031 Acute diastolic (congestive) heart failure: Secondary | ICD-10-CM

## 2018-03-01 LAB — BASIC METABOLIC PANEL
Anion gap: 12 (ref 5–15)
BUN: 117 mg/dL — ABNORMAL HIGH (ref 6–20)
CO2: 27 mmol/L (ref 22–32)
Calcium: 7.5 mg/dL — ABNORMAL LOW (ref 8.9–10.3)
Chloride: 89 mmol/L — ABNORMAL LOW (ref 98–111)
Creatinine, Ser: 7.44 mg/dL — ABNORMAL HIGH (ref 0.61–1.24)
GFR calc Af Amer: 9 mL/min — ABNORMAL LOW (ref >60)
GFR calc non Af Amer: 8 mL/min — ABNORMAL LOW (ref >60)
Glucose, Bld: 294 mg/dL — ABNORMAL HIGH (ref 70–99)
Potassium: 5.9 mmol/L — ABNORMAL HIGH (ref 3.5–5.1)
Sodium: 128 mmol/L — ABNORMAL LOW (ref 135–145)

## 2018-03-01 LAB — RESPIRATORY PANEL BY PCR
Adenovirus: NOT DETECTED
Bordetella pertussis: NOT DETECTED
CORONAVIRUS OC43-RVPPCR: NOT DETECTED
Chlamydophila pneumoniae: NOT DETECTED
Coronavirus 229E: NOT DETECTED
Coronavirus HKU1: NOT DETECTED
Coronavirus NL63: NOT DETECTED
INFLUENZA B-RVPPCR: NOT DETECTED
Influenza A: NOT DETECTED
METAPNEUMOVIRUS-RVPPCR: NOT DETECTED
Mycoplasma pneumoniae: NOT DETECTED
Parainfluenza Virus 1: NOT DETECTED
Parainfluenza Virus 2: NOT DETECTED
Parainfluenza Virus 3: NOT DETECTED
Parainfluenza Virus 4: NOT DETECTED
RESPIRATORY SYNCYTIAL VIRUS-RVPPCR: NOT DETECTED
Rhinovirus / Enterovirus: NOT DETECTED

## 2018-03-01 LAB — BLOOD CULTURE ID PANEL (REFLEXED)
ACINETOBACTER BAUMANNII: NOT DETECTED
CANDIDA ALBICANS: NOT DETECTED
CANDIDA PARAPSILOSIS: NOT DETECTED
Candida glabrata: NOT DETECTED
Candida krusei: NOT DETECTED
Candida tropicalis: NOT DETECTED
Enterobacter cloacae complex: NOT DETECTED
Enterobacteriaceae species: NOT DETECTED
Enterococcus species: NOT DETECTED
Escherichia coli: NOT DETECTED
Haemophilus influenzae: DETECTED — AB
KLEBSIELLA OXYTOCA: NOT DETECTED
Klebsiella pneumoniae: NOT DETECTED
Listeria monocytogenes: NOT DETECTED
Neisseria meningitidis: NOT DETECTED
Proteus species: NOT DETECTED
Pseudomonas aeruginosa: NOT DETECTED
Serratia marcescens: NOT DETECTED
Staphylococcus aureus (BCID): NOT DETECTED
Staphylococcus species: NOT DETECTED
Streptococcus agalactiae: NOT DETECTED
Streptococcus pneumoniae: NOT DETECTED
Streptococcus pyogenes: NOT DETECTED
Streptococcus species: NOT DETECTED

## 2018-03-01 LAB — CBC
HCT: 45 % (ref 39.0–52.0)
Hemoglobin: 14.1 g/dL (ref 13.0–17.0)
MCH: 30.1 pg (ref 26.0–34.0)
MCHC: 31.3 g/dL (ref 30.0–36.0)
MCV: 96.2 fL (ref 80.0–100.0)
PLATELETS: 144 10*3/uL — AB (ref 150–400)
RBC: 4.68 MIL/uL (ref 4.22–5.81)
RDW: 14.6 % (ref 11.5–15.5)
WBC: 20.5 10*3/uL — ABNORMAL HIGH (ref 4.0–10.5)
nRBC: 0.1 % (ref 0.0–0.2)

## 2018-03-01 LAB — ECHOCARDIOGRAM COMPLETE
Height: 71 in
Weight: 6155.24 oz

## 2018-03-01 LAB — PROCALCITONIN: Procalcitonin: 20.36 ng/mL

## 2018-03-01 LAB — GLUCOSE, CAPILLARY
Glucose-Capillary: 284 mg/dL — ABNORMAL HIGH (ref 70–99)
Glucose-Capillary: 302 mg/dL — ABNORMAL HIGH (ref 70–99)
Glucose-Capillary: 310 mg/dL — ABNORMAL HIGH (ref 70–99)
Glucose-Capillary: 303 mg/dL — ABNORMAL HIGH (ref 70–99)

## 2018-03-01 LAB — BLOOD GAS, ARTERIAL
Acid-base deficit: 3.5 mmol/L — ABNORMAL HIGH (ref 0.0–2.0)
Bicarbonate: 25.4 mmol/L (ref 20.0–28.0)
FIO2: 0.28
O2 Saturation: 81.9 %
PCO2 ART: 62 mmHg — AB (ref 32.0–48.0)
Patient temperature: 37
pH, Arterial: 7.22 — ABNORMAL LOW (ref 7.350–7.450)
pO2, Arterial: 56 mmHg — ABNORMAL LOW (ref 83.0–108.0)

## 2018-03-01 LAB — TYPE AND SCREEN
ABO/RH(D): A POS
ANTIBODY SCREEN: NEGATIVE

## 2018-03-01 LAB — POTASSIUM
Potassium: 5.7 mmol/L — ABNORMAL HIGH (ref 3.5–5.1)
Potassium: 5.4 mmol/L — ABNORMAL HIGH (ref 3.5–5.1)

## 2018-03-01 MED ORDER — PATIROMER SORBITEX CALCIUM 8.4 G PO PACK
8.4000 g | PACK | Freq: Once | ORAL | Status: AC
  Administered 2018-03-01: 8.4 g via ORAL
  Filled 2018-03-01: qty 1

## 2018-03-01 MED ORDER — SODIUM CHLORIDE 0.9% FLUSH: 10.0000 mL | INTRAVENOUS | Status: DC | PRN

## 2018-03-01 MED ORDER — SODIUM CHLORIDE 0.9% FLUSH
10.0000 mL | Freq: Two times a day (BID) | INTRAVENOUS | Status: DC
  Administered 2018-03-01 (×3): 10 mL
  Administered 2018-03-02: 30 mL
  Administered 2018-03-02 – 2018-03-03 (×3): 10 mL

## 2018-03-01 MED ORDER — INSULIN ASPART 100 UNIT/ML IV SOLN
10.0000 [IU] | Freq: Once | INTRAVENOUS | Status: AC
  Administered 2018-03-01: 10 [IU] via INTRAVENOUS
  Filled 2018-03-01: qty 0.1

## 2018-03-01 MED ORDER — DEXTROSE 50 % IV SOLN
1.0000 | Freq: Once | INTRAVENOUS | Status: AC
  Administered 2018-03-01: 50 mL via INTRAVENOUS
  Filled 2018-03-01: qty 50

## 2018-03-01 NOTE — Progress Notes (Addendum)
CRITICAL CARE NOTE  CC  Septic shock- due to Hemophilus bacteremia  SUBJECTIVE Patient remains critically ill, requiring pressor support, improved mentation overnight, alble to speak in full sentences coherently and follow commands.     SIGNIFICANT EVENTS -s/p TTE   BP 99/76   Pulse 83   Temp 97.9 F (36.6 C) (Oral)   Resp 20   Ht 5\' 11"  (1.803 m)   Wt (!) 174.5 kg   SpO2 93%   BMI 53.66 kg/m    REVIEW OF SYSTEMS 10 systems reviewed and are neg except as per plan and physical exam, plus back pain and leg pain  PHYSICAL EXAMINATION:  GENERAL:NAD HEAD: Normocephalic, atraumatic.  EYES: Pupils equal, round, reactive to light.  No scleral icterus.  MOUTH: Moist mucosal membrane. NECK: Supple. No thyromegaly. No nodules. No JVD.  PULMONARY: mild bibasilar crackles CARDIOVASCULAR: S1 and S2. Regular rate and rhythm. No murmurs, rubs, or gallops.  GASTROINTESTINAL: Soft, nontender, -distended. No masses. Positive bowel sounds. No hepatosplenomegaly.  MUSCULOSKELETAL: No swelling, clubbing, or edema.  NEUROLOGIC: obtunded, GCS<8 SKIN:intact,warm,dry      Indwelling Urinary Catheter continued, requirement due to   Reason to continue Indwelling Urinary Catheter for strict Intake/Output monitoring for hemodynamic instability   Central Line continued, requirement due to   Reason to continue [employer] Monitoring of central venous pressure or other hemodynamic parameters   Ventilator continued, requirement due to, resp failure    Ventilator Sedation RASS 0 to -2        Septic shock -Due to bilateral lower lobe pneumonia and hemophilus bacteremia -Blood cultures pending -Status post 2 L fluids, 1 L of LR in process -Levophed requirements less overnight  -Discussed case with family at bedside -Procalcitonin ->20  -Leukocytosis at 23,000 and elevated lactate >2   Heart failure with reduced EF -Conservative fluid strategy Hold Lasix while patient is  septic and shock -Transthoracic done today   Acute renal failure - KDIGO stage 4   - appreciate renal recs - for HD , need access  - patient declined    Mild persistent asthma -Albuterol nebs every 4 hours while awake  Morbid obesity   GI DVT prophylaxis -Protonix, SCDs     Critical Care Time devoted to patient care services described in this note is 42 minutes.     Overall, patient is critically ill, prognosis is guarded.  Patient with Multiorgan failure and at high risk for cardiac arrest and death.     Patient/Family are satisfied with Plan of action and management. All questions answered      Ottie Glazier, MD Division of Pulmonary and Critical Care Medicine 5:37 PM 02/28/2018

## 2018-03-01 NOTE — Progress Notes (Signed)
Patient ID: [name], male   DOB: Oct 30, 1965, 53 y.o.   MRN: [id number]  [facility] PROGRESS NOTE  [name] FIE:[id number] DOB: 09-10-1965 DOA: 02/28/2018 PCP: Darren Hauser, DO  HPI/Subjective: Patient stated that he normally gets congestion gets a Z-Pak and then things get better.  He has been having a lot of drainage in the back of his throat.  He went to go get some Mucinex and the son was so bright he thought he was going to heaven.  He got very very weak.  He went home and drank some beer and then got very very weak again.  Ended up coming to the hospital.  Objective: Vitals:   03/01/18 1200 03/01/18 1300  BP: (!) 113/91 (!) 138/91  Pulse: 86 87  Resp: (!) 22 17  Temp:    SpO2:      Filed Weights   02/28/18 1229 03/01/18 0330  Weight: (!) 168.3 kg (!) 174.5 kg    ROS: Review of Systems  Constitutional: Negative for chills and fever.  Eyes: Negative for blurred vision.  Respiratory: Positive for cough and shortness of breath.   Cardiovascular: Negative for chest pain.  Gastrointestinal: Negative for abdominal pain, constipation, diarrhea, nausea and vomiting.  Genitourinary: Negative for dysuria.  Musculoskeletal: Negative for joint pain.  Neurological: Negative for dizziness and headaches.   Exam: Physical Exam  Constitutional: He is oriented to person, place, and time.  HENT:  Nose: Mucosal edema present.  Mouth/Throat: No oropharyngeal exudate or posterior oropharyngeal edema.  Eyes: Pupils are equal, round, and reactive to light. Conjunctivae, EOM and lids are normal.  Neck: No JVD present. Carotid bruit is not present. No edema present. No thyroid mass and no thyromegaly present.  Cardiovascular: S1 normal and S2 normal. Exam reveals no gallop.  No murmur heard. Respiratory: No respiratory distress. He has decreased breath sounds in the right lower field and the left lower field. He has no wheezes. He has rhonchi in the right lower  field and the left lower field. He has no rales.  GI: Soft. Bowel sounds are normal. There is no abdominal tenderness.  Musculoskeletal:     Right ankle: He exhibits swelling.     Left ankle: He exhibits swelling.  Lymphadenopathy:    He has no cervical adenopathy.  Neurological: He is alert and oriented to person, place, and time. No cranial nerve deficit.  Skin: Skin is warm. No rash noted. Nails show no clubbing.  Psychiatric: He has a normal mood and affect.      Data Reviewed: Basic Metabolic Panel: Recent Labs  Lab 02/28/18 0909 03/01/18 0343 03/01/18 1242  NA 128* 128*  --   K 5.3* 5.9* 5.7*  CL 84* 89*  --   CO2 30 27  --   GLUCOSE 188* 294*  --   BUN 115* 117*  --   CREATININE 7.47* 7.44*  --   CALCIUM 8.2* 7.5*  --    Liver Function Tests: Recent Labs  Lab 02/28/18 0909  AST 20  ALT 17  ALKPHOS 92  BILITOT 1.3*  PROT 7.1  ALBUMIN 2.5*   CBC: Recent Labs  Lab 02/28/18 0909 03/01/18 0343  WBC 23.3* 20.5*  NEUTROABS 21.0*  --   HGB 14.8 14.1  HCT 45.7 45.0  MCV 93.6 96.2  PLT 152 144*   Cardiac Enzymes: Recent Labs  Lab 02/28/18 0909  TROPONINI <0.03    CBG: Recent Labs  Lab 02/28/18 1222 02/28/18 1714 02/28/18  2124 03/01/18 0723 03/01/18 1142  GLUCAP 185* 178* 233* 284* 302*    Recent Results (from the past 240 hour(s))  Blood culture (routine x 2)     Status: None (Preliminary result)   Collection Time: 02/28/18 10:11 AM  Result Value Ref Range Status   Specimen Description BLOOD RIGHT ANTECUBITAL  Final   Special Requests   Final    BOTTLES DRAWN AEROBIC AND ANAEROBIC Blood Culture adequate volume   Culture  Setup Time   Final    Organism ID to follow AEROBIC BOTTLE ONLY GRAM NEGATIVE COCCOBACILLI CRITICAL RESULT CALLED TO, READ BACK BY AND VERIFIED WITH: KAREN HAYES AT [place] ON 03/01/2018 JJB Performed at [facility], [address] [place], [place] [address]    Culture GRAM NEGATIVE COCCOBACILLI  Final    Report Status PENDING  Incomplete  Blood culture (routine x 2)     Status: None (Preliminary result)   Collection Time: 02/28/18 10:11 AM  Result Value Ref Range Status   Specimen Description BLOOD BLOOD RIGHT HAND  Final   Special Requests   Final    BOTTLES DRAWN AEROBIC AND ANAEROBIC Blood Culture results may not be optimal due to an inadequate volume of blood received in culture bottles   Culture   Final    NO GROWTH 1 DAY Performed at [facility], [address] [place], [place] [address]    Report Status PENDING  Incomplete  Blood Culture ID Panel (Reflexed)     Status: Abnormal   Collection Time: 02/28/18 10:11 AM  Result Value Ref Range Status   Enterococcus species NOT DETECTED NOT DETECTED Final   Listeria monocytogenes NOT DETECTED NOT DETECTED Final   Staphylococcus species NOT DETECTED NOT DETECTED Final   Staphylococcus aureus (BCID) NOT DETECTED NOT DETECTED Final   Streptococcus species NOT DETECTED NOT DETECTED Final   Streptococcus agalactiae NOT DETECTED NOT DETECTED Final   Streptococcus pneumoniae NOT DETECTED NOT DETECTED Final   Streptococcus pyogenes NOT DETECTED NOT DETECTED Final   Acinetobacter baumannii NOT DETECTED NOT DETECTED Final   Enterobacteriaceae species NOT DETECTED NOT DETECTED Final   Enterobacter cloacae complex NOT DETECTED NOT DETECTED Final   Escherichia coli NOT DETECTED NOT DETECTED Final   Klebsiella oxytoca NOT DETECTED NOT DETECTED Final   Klebsiella pneumoniae NOT DETECTED NOT DETECTED Final   Proteus species NOT DETECTED NOT DETECTED Final   Serratia marcescens NOT DETECTED NOT DETECTED Final   Haemophilus influenzae DETECTED (A) NOT DETECTED Final    Comment: CRITICAL RESULT CALLED TO, READ BACK BY AND VERIFIED WITH: KAREN HAYES AT 0826 ON 03/01/2018 JJB    Neisseria meningitidis NOT DETECTED NOT DETECTED Final   Pseudomonas aeruginosa NOT DETECTED NOT DETECTED Final   Candida albicans NOT DETECTED NOT DETECTED Final    Candida glabrata NOT DETECTED NOT DETECTED Final   Candida krusei NOT DETECTED NOT DETECTED Final   Candida parapsilosis NOT DETECTED NOT DETECTED Final   Candida tropicalis NOT DETECTED NOT DETECTED Final    Comment: Performed at [facility], [address] [place], [place] [address]  MRSA PCR Screening     Status: None   Collection Time: 02/28/18 12:31 PM  Result Value Ref Range Status   MRSA by PCR NEGATIVE NEGATIVE Final    Comment:        The GeneXpert MRSA Assay (FDA approved for NASAL specimens only), is one component of a comprehensive MRSA colonization surveillance program. It is not intended to diagnose MRSA infection  nor to guide or monitor treatment for MRSA infections. Performed at [facility], [address] [place], [place] [address]   Culture, blood (single) w Reflex to ID Panel     Status: None (Preliminary result)   Collection Time: 02/28/18  6:05 PM  Result Value Ref Range Status   Specimen Description BLOOD BLOOD LEFT HAND  Final   Special Requests   Final    BOTTLES DRAWN AEROBIC ONLY Blood Culture results may not be optimal due to an inadequate volume of blood received in culture bottles   Culture   Final    NO GROWTH < 24 HOURS Performed at [facility], [address] [place], [place] [address]    Report Status PENDING  Incomplete  Respiratory Panel by PCR     Status: None   Collection Time: 02/28/18  8:56 PM  Result Value Ref Range Status   Adenovirus NOT DETECTED NOT DETECTED Final   Coronavirus 229E NOT DETECTED NOT DETECTED Final    Comment: (NOTE) The Coronavirus on the Respiratory Panel, DOES NOT test for the novel  Coronavirus (2019 nCoV)    Coronavirus HKU1 NOT DETECTED NOT DETECTED Final   Coronavirus NL63 NOT DETECTED NOT DETECTED Final   Coronavirus OC43 NOT DETECTED NOT DETECTED Final   Metapneumovirus NOT DETECTED NOT DETECTED Final   Rhinovirus / Enterovirus NOT DETECTED NOT DETECTED Final    Influenza A NOT DETECTED NOT DETECTED Final   Influenza B NOT DETECTED NOT DETECTED Final   Parainfluenza Virus 1 NOT DETECTED NOT DETECTED Final   Parainfluenza Virus 2 NOT DETECTED NOT DETECTED Final   Parainfluenza Virus 3 NOT DETECTED NOT DETECTED Final   Parainfluenza Virus 4 NOT DETECTED NOT DETECTED Final   Respiratory Syncytial Virus NOT DETECTED NOT DETECTED Final   Bordetella pertussis NOT DETECTED NOT DETECTED Final   Chlamydophila pneumoniae NOT DETECTED NOT DETECTED Final   Mycoplasma pneumoniae NOT DETECTED NOT DETECTED Final    Comment: Performed at [facility], [address], [place], [place] [address]     Studies: US Renal  Result Date: 02/28/2018 CLINICAL DATA:  Acute kidney injury. EXAM: RENAL / URINARY TRACT ULTRASOUND COMPLETE COMPARISON:  None. FINDINGS: Right Kidney: Renal measurements: 11.4 x 6.2 x 7.5 cm = volume: 279.4 mL. Increased echogenicity. No mass or hydronephrosis. Left Kidney: Renal measurements: 10.3 x 7.1 x 6.9 cm = volume: 265.2 mL. Increased echogenicity. No mass or hydronephrosis. Bladder: Unable to visualize. IMPRESSION: Normal renal volume. Increased echogenicity. No mass or hydronephrosis. Electronically Signed   By: Staci Righter M.D.   On: 02/28/2018 10:51   Dg Chest Port 1 View  Result Date: 03/01/2018 CLINICAL DATA:  Acute respiratory failure EXAM: PORTABLE CHEST 1 VIEW COMPARISON:  Yesterday FINDINGS: Airspace disease at the right base. Linear density overlapping the left base, question left lower lobe collapse. Cardiomegaly without Kerley lines. No evident effusion. No pneumothorax. Unchanged positioning of central line with tip at the region of the right subclavian vein. IMPRESSION: 1. Unchanged central line positioning on the right with tip near the subclavian vein. 2. Airspace disease on the right favoring pneumonia. Suspect left lower lobe collapse. Electronically Signed   By: Monte Fantasia M.D.   On: 03/01/2018 06:07   Dg Chest Port  1 View  Result Date: 02/28/2018 CLINICAL DATA:  PICC line placement EXAM: PORTABLE CHEST 1 VIEW COMPARISON:  Portable exam 6041 hours compared to 02/28/2018 at 1441 hours FINDINGS: Tip of RIGHT arm PICC line is again  identified at the costal margin at the RIGHT subclavian vein. Enlargement of cardiac silhouette with pulmonary vascular congestion. Bibasilar opacities likely representing a combination of atelectasis and consolidation. Upper lungs clear. No pneumothorax. IMPRESSION: Unchanged position of the tip of the RIGHT arm PICC line at the RIGHT subclavian vein Consider advancing catheter 13-15 cm to place in SVC. Persistent bibasilar opacities. Electronically Signed   By: Lavonia Dana M.D.   On: 02/28/2018 17:14   Dg Chest Port 1 View  Result Date: 02/28/2018 CLINICAL DATA:  PICC placement. EXAM: PORTABLE CHEST 1 VIEW COMPARISON:  Earlier today. FINDINGS: Stable enlarged cardiac silhouette. Less prominent mass-like density in the right lower lung zone. Stable prominence of the pulmonary vasculature and mild prominence of the interstitial markings. No visible pleural fluid. The right PICC tip is in the right subclavian vein. This would need to be advanced approximately 15 cm to place it at the superior cavoatrial junction. Thoracic spine degenerative changes. IMPRESSION: 1. Right PICC tip in the right subclavian vein. It would need to be advanced approximately 15 cm to place it at the superior cavoatrial junction. 2. Left prominent mass or mass-like consolidation in the right lower lung zone. 3. Stable cardiomegaly, pulmonary vascular congestion and mild chronic interstitial lung disease. Electronically Signed   By: Claudie Revering M.D.   On: 02/28/2018 15:04   Dg Chest Portable 1 View  Result Date: 02/28/2018 CLINICAL DATA:  Weakness this morning. History of asthma, hypertension and congestive heart failure. Evaluate for edema, cardiomegaly, consolidation. EXAM: PORTABLE CHEST 1 VIEW COMPARISON:  Radiographs  09/03/2015 and 11/01/2011. FINDINGS: 0922 hours. Stable cardiomegaly and chronic vascular congestion. There is no overt pulmonary edema. At the right lung base, there is consolidation or an ill-defined mass without obscuration of the right heart border or right hemidiaphragm. The left lung appears clear. There is no pleural effusion or pneumothorax. IMPRESSION: Right basilar infiltrate or mass. This could reflect pneumonia. Followup PA and lateral chest X-ray is recommended in 3-4 weeks following trial of antibiotic therapy to ensure resolution and exclude underlying malignancy. Electronically Signed   By: Richardean Sale M.D.   On: 02/28/2018 09:41    Scheduled Meds: . heparin  5,000 Units Subcutaneous Q8H  . insulin aspart  0-5 Units Subcutaneous QHS  . insulin aspart  0-9 Units Subcutaneous TID WC  . ipratropium-albuterol  3 mL Nebulization Q6H  . methylPREDNISolone (SOLU-MEDROL) injection  60 mg Intravenous Q24H  . sodium chloride flush  10-40 mL Intracatheter Q12H   Continuous Infusions: . azithromycin 500 mg (03/01/18 1251)  . cefTRIAXone (ROCEPHIN)  IV 2 g (03/01/18 1043)  . lactated ringers 150 mL/hr at 03/01/18 1041  . norepinephrine (LEVOPHED) Adult infusion 18 mcg/min (03/01/18 1328)  . vasopressin (PITRESSIN) infusion - *FOR SHOCK* 0.03 Units/min (03/01/18 0354)    Assessment/Plan:  1. Septic shock with multiorgan failure secondary to Haemophilus influenza pneumonia.  Patient on Rocephin and Zithromax.  Patient is on 2 pressors and IV fluids to maintain blood pressure. 2. Acute kidney injury on chronic kidney disease stage III with hyperkalemia.  In nephrology note, they recommended dialysis. 3. Type 2 diabetes mellitus.  Sugars will be high while on steroids.  Lantus and sliding scale. 4. Morbid obesity with a BMI of 53.66.  Weight loss needed  Code Status:     Code Status Orders  (From admission, onward)         Start     Ordered   02/28/18 1059  Full code  Continuous  02/28/18 1059        Code Status History    This patient has a current code status but no historical code status.     Family Communication: Patient tried to call his mother while I was in the room with him.  She did not answer the phone I offered to call while was at the nursing station.  He refused me to call his mother at this time. Disposition Plan: To be determined  Consultants:  Critical care specialist  Nephrology  Antibiotics:  Rocephin  Zithromax  Time spent: 35 minutes  [place]

## 2018-03-01 NOTE — Progress Notes (Signed)
Pt with 10 beat run VTach, EKG performed after monitor reviewed, no arrhythmia captured, pt A&O and sitting on SOB, MD Aleskarov notified, no new orders except continue to wean levophed

## 2018-03-01 NOTE — Progress Notes (Signed)
*  PRELIMINARY RESULTS* Echocardiogram 2D Echocardiogram has been performed.  [name] 03/01/2018, 9:51 AM

## 2018-03-01 NOTE — Progress Notes (Signed)
Pastoral Care Visit    03/01/18 1600  Clinical Encounter Type  Visited With Patient  Visit Type Initial;Spiritual support;Psychological support  Consult/Referral To Chaplain  Stress Factors  Patient Stress Factors Health changes   Pt was sitting up and verbalizing his annoyance with the machine beeping.  I asked if pt wanted me to notify nurse but he declined.  Pt shared about his faith and past family challenges.  Chap provided comp presence and empathized.  Darcey Nora, Chaplain

## 2018-03-01 NOTE — Progress Notes (Signed)
Inpatient Diabetes Program Recommendations  AACE/ADA: New Consensus Statement on Inpatient Glycemic Control (2015)  Target Ranges:  Prepandial:   less than 140 mg/dL      Peak postprandial:   less than 180 mg/dL (1-2 hours)      Critically ill patients:  140 - 180 mg/dL   Lab Results  Component Value Date   GLUCAP 284 (H) 03/01/2018   HGBA1C 8.1 (H) 02/28/2018    Review of Glycemic ControlResults for [name], [name] (MRN [id number]) as of 03/01/2018 11:40  Ref. Range 02/28/2018 09:04 02/28/2018 12:22 02/28/2018 17:14 02/28/2018 21:24 03/01/2018 07:23  Glucose-Capillary Latest Ref Range: 70 - 99 mg/dL 199 (H) 185 (H) 178 (H) 233 (H) 284 (H)    Diabetes history: DM 2 Outpatient Diabetes medications:  Basaglar 10 units daily Current orders for Inpatient glycemic control:  Novolog sensitive tid with meals and HS Solumedrol 60 mg IV q 24 hours  Inpatient Diabetes Program Recommendations:    May consider restarting home dose of basal insulin.  Consider Lantus 10 units daily.    Thanks  Adah Perl, RN, BC-ADM Inpatient Diabetes Coordinator Pager [phone] (8a-5p)

## 2018-03-01 NOTE — Consult Note (Signed)
[facility] CONSULT NOTE    Date: 03/01/2018                  Patient Name:  [name]  MRN: [id number]  DOB: 13-Jun-1965  Age / Sex: 53 y.o., male         PCP: [name], DO                 Service Requesting Consult: Critical Care                 Reason for Consult: Acute renal failure/CKD stage III/hyperkalemia            History of Present Illness: Patient is a 53 y.o. male with a PMHx of morbid obesity, hypertension, gout, asthma, seasonal allergies, chronic kidney disease stage III baseline EGFR 41, who was admitted to [facility] on 02/28/2018 for evaluation of hypotension.  Patient reports that over the past several weeks he has had significant sinus drainage.  This unfortunately affected his appetite and he was eating and drinking a lot less than normal.  In addition he was taking ibuprofen intermittently.  Patient also found to have right lower lobe pneumonia.  He was found to be significantly hypotensive and is still on vasopressin at the moment.  His baseline EGFR is 43 from April 2019.  Upon presentation his BUN was 115 with a creatinine of 7.47 and EGFR of 9.  Very little urine output noted at home.  He is also significantly hyperkalemic with a serum potassium of 5.9.  Patient also has low sodium of 128.  We had a long discussion with him regarding the potential for temporary renal placement therapy.  After additional consideration he was willing to undergo this.   Medications: Outpatient medications: Medications Prior to Admission  Medication Sig Dispense Refill Last Dose  . albuterol (PROVENTIL) (2.5 MG/3ML) 0.083% nebulizer solution Take 3 mLs (2.5 mg total) by nebulization every 6 (six) hours as needed for wheezing or shortness of breath. 150 mL 1 prn at prn  . albuterol (VENTOLIN HFA) 108 (90 Base) MCG/ACT inhaler Inhale 2 puffs into the lungs every 4 (four) hours as needed for wheezing or shortness of breath. 18 g 0 prn at prn  .  allopurinol (ZYLOPRIM) 100 MG tablet Take 1 tablet (100 mg total) by mouth daily. 90 tablet 1 prn at prn  . aspirin 81 MG chewable tablet Chew 81 mg by mouth daily.   02/28/2018 at 0800  . atorvastatin (LIPITOR) 20 MG tablet Take 1 tablet (20 mg total) by mouth daily. 90 tablet 3 prn at prn  . carvedilol (COREG) 25 MG tablet Take 1 tablet (25 mg total) by mouth 2 (two) times daily with a meal. (Patient taking differently: Take 50 mg by mouth 2 (two) times daily with a meal. ) 60 tablet 11 02/28/2018 at 0800  . gabapentin (NEURONTIN) 100 MG capsule Start 1 capsule daily, increase by 1 cap every 2-3 days as tolerated up to 1-3 capsules per dose 2 times a day 60 capsule 1   . Insulin Glargine (BASAGLAR KWIKPEN) 100 UNIT/ML SOPN Inject 10 Units into the skin daily. Submitted to [employer] for assistance   02/28/2018 at 0800  . Insulin Pen Needle (B-D UF III MINI PEN NEEDLES) 31G X 5 MM MISC 1 each by Does not apply route daily. Use once daily with Victoza. 100 each 12 Taking  . lisinopril (PRINIVIL,ZESTRIL) 2.5 MG tablet Take 1 tablet (2.5 mg total)  by mouth daily.   02/28/2018 at 0800  . loratadine (CLARITIN) 10 MG tablet Take 1 tablet (10 mg total) by mouth daily. 90 tablet 3 02/28/2018 at 0800  . metolazone (ZAROXOLYN) 5 MG tablet Take 5 mg by mouth daily.    02/28/2018 at 0800  . omeprazole (PRILOSEC) 40 MG capsule Take 1 capsule (40 mg total) by mouth daily. 90 capsule 3 02/28/2018 at 0800  . ranitidine (ZANTAC) 150 MG tablet Take 1 tablet (150 mg total) by mouth daily before breakfast. 30 tablet 5 02/28/2018 at 0800  . traMADol (ULTRAM) 50 MG tablet Take 1-2 tablets (50-100 mg total) by mouth every 12 (twelve) hours as needed. 30 tablet 2 prn at prn  . vitamin C (ASCORBIC ACID) 500 MG tablet Take 1,000 mg by mouth daily.   02/28/2018 at 0800  . Zinc 50 MG CAPS Take 50 mg by mouth daily.   02/28/2018 at 0800  . blood glucose meter kit and supplies [employer] Brand of choice; LON 99 months; check FSBS 3x a day; E11.65; disp  one meter with 100 strips + 1 refill of strips 1 each 0 Taking  . bumetanide (BUMEX) 1 MG tablet Take 1 mg by mouth daily.    Not Taking    Current medications: Current Facility-Administered Medications  Medication Dose Route Frequency Provider Last Rate Last Dose  . acetaminophen (TYLENOL) tablet 650 mg  650 mg Oral Q6H PRN Hillary Bow, MD       Or  . acetaminophen (TYLENOL) suppository 650 mg  650 mg Rectal Q6H PRN Sudini, Srikar, MD      . albuterol (PROVENTIL) (2.5 MG/3ML) 0.083% nebulizer solution 2.5 mg  2.5 mg Nebulization Q2H PRN Sudini, Srikar, MD      . azithromycin (ZITHROMAX) 500 mg in sodium chloride 0.9 % 250 mL IVPB  500 mg Intravenous Q24H Merlyn Lot, MD   Stopped at 02/28/18 1148  . cefTRIAXone (ROCEPHIN) 2 g in sodium chloride 0.9 % 100 mL IVPB  2 g Intravenous Q24H Merlyn Lot, MD   Stopped at 02/28/18 1048  . heparin injection 5,000 Units  5,000 Units Subcutaneous Q8H Hillary Bow, MD   5,000 Units at 03/01/18 0520  . insulin aspart (novoLOG) injection 0-5 Units  0-5 Units Subcutaneous QHS Hillary Bow, MD   2 Units at 02/28/18 2138  . insulin aspart (novoLOG) injection 0-9 Units  0-9 Units Subcutaneous TID WC Hillary Bow, MD   5 Units at 03/01/18 [id number]  . ipratropium-albuterol (DUONEB) 0.5-2.5 (3) MG/3ML nebulizer solution 3 mL  3 mL Nebulization Q6H Ottie Glazier, MD   3 mL at 03/01/18 0903  . lactated ringers infusion   Intravenous Continuous Ottie Glazier, MD 150 mL/hr at 02/28/18 2214    . methylPREDNISolone sodium succinate (SOLU-MEDROL) 125 mg/2 mL injection 60 mg  60 mg Intravenous Q24H Charlett Nose, RPH      . norepinephrine (LEVOPHED) 16 mg in D5W 292m premix infusion  0-40 mcg/min Intravenous Titrated AOttie Glazier MD 22.5 mL/hr at 03/01/18 0844 24 mcg/min at 03/01/18 0844  . ondansetron (ZOFRAN) tablet 4 mg  4 mg Oral Q6H PRN SHillary Bow MD       Or  . ondansetron (ZOFRAN) injection 4 mg  4 mg Intravenous Q6H PRN Sudini,  Srikar, MD      . polyethylene glycol (MIRALAX / GLYCOLAX) packet 17 g  17 g Oral Daily PRN Sudini, Srikar, MD      . sodium chloride flush (NS) 0.9 % injection 10-40 mL  10-40 mL Intracatheter Q12H [name], NP   10 mL at 03/01/18 0030  . sodium chloride flush (NS) 0.9 % injection 10-40 mL  10-40 mL Intracatheter PRN [name], NP      . vasopressin (PITRESSIN) 40 Units in sodium chloride 0.9 % 250 mL (0.16 Units/mL) infusion  0.03 Units/min Intravenous Continuous [name], NP 11.25 mL/hr at 03/01/18 0354 0.03 Units/min at 03/01/18 0354      Allergies: No Known Allergies    Past Medical History: Past Medical History:  Diagnosis Date  . Allergy   . Asthma   . Gout   . Hypertension   . Morbid obesity ([place])      Past Surgical History: History reviewed. No pertinent surgical history.   Family History: Family History  Problem Relation Age of Onset  . Diabetes Mother   . Hypertension Mother   . Alcohol abuse Father   . Glaucoma Father   . Diabetes Father   . Hypertension Father   . Cancer Neg Hx   . COPD Neg Hx   . Heart disease Neg Hx   . Stroke Neg Hx      Social History: Social History   Socioeconomic History  . Marital status: Single    Spouse name: Not on file  . Number of children: Not on file  . Years of education: Not on file  . Highest education level: Not on file  Occupational History  . Not on file  Social Needs  . Financial resource strain: Not on file  . Food insecurity:    Worry: Not on file    Inability: Not on file  . Transportation needs:    Medical: Not on file    Non-medical: Not on file  Tobacco Use  . Smoking status: Never Smoker  . Smokeless tobacco: Never Used  Substance and Sexual Activity  . Alcohol use: Yes    Comment: a glass of wine and a couple beers a day  . Drug use: No  . Sexual activity: Not on file  Lifestyle  . Physical activity:    Days per week: Not on file    Minutes per session: Not on  file  . Stress: Not on file  Relationships  . Social connections:    Talks on phone: Not on file    Gets together: Not on file    Attends religious service: Not on file    Active member of club or organization: Not on file    Attends meetings of clubs or organizations: Not on file    Relationship status: Not on file  . Intimate partner violence:    Fear of current or ex partner: Not on file    Emotionally abused: Not on file    Physically abused: Not on file    Forced sexual activity: Not on file  Other Topics Concern  . Not on file  Social History Narrative  . Not on file     Review of Systems: Review of Systems  Constitutional: Positive for malaise/fatigue. Negative for chills and fever.  HENT: Positive for congestion and sinus pain. Negative for hearing loss and tinnitus.   Eyes: Negative for blurred vision and double vision.  Respiratory: Positive for cough, sputum production and shortness of breath.   Cardiovascular: Positive for orthopnea. Negative for chest pain and palpitations.  Gastrointestinal: Negative for heartburn, nausea and vomiting.  Genitourinary: Positive for dysuria. Negative for urgency.  Musculoskeletal: Negative for joint pain and myalgias.  Skin: Negative for itching and rash.  Neurological: Positive for dizziness and loss of consciousness.  Endo/Heme/Allergies: Negative for polydipsia. Does not bruise/bleed easily.  Psychiatric/Behavioral: Negative for depression. The patient is not nervous/anxious.      Vital Signs: Blood pressure 99/76, pulse 83, temperature 97.9 F (36.6 C), temperature source Oral, resp. rate 20, height _0  (1.803 m), weight (!) 174.5 kg, SpO2 93 %.  Weight trends: Filed Weights   02/28/18 1229 03/01/18 0330  Weight: (!) 168.3 kg (!) 174.5 kg    Physical Exam: General: NAD, sitting up in bed  Head: Normocephalic, atraumatic.  Eyes: Anicteric, EOMI  Nose: Mucous membranes moist, not inflammed, nonerythematous.   Throat: Oropharynx nonerythematous, no exudate appreciated.   Neck: Supple, trachea midline.  Lungs:  Normal respiratory effort. Basilar rales  Heart: RRR. S1 and S2 normal without gallop, murmur, or rubs.  Abdomen:  BS normoactive. Soft, Nondistended, non-tender.  No masses or organomegaly.  Extremities: 2+ pretibial edema.  Neurologic: A&O X3, Motor strength is 5/5 in the all 4 extremities  Skin: No visible rashes, scars.    Lab results: Basic Metabolic Panel: Recent Labs  Lab 02/28/18 0909 03/01/18 0343  NA 128* 128*  K 5.3* 5.9*  CL 84* 89*  CO2 30 27  GLUCOSE 188* 294*  BUN 115* 117*  CREATININE 7.47* 7.44*  CALCIUM 8.2* 7.5*    Liver Function Tests: Recent Labs  Lab 02/28/18 0909  AST 20  ALT 17  ALKPHOS 92  BILITOT 1.3*  PROT 7.1  ALBUMIN 2.5*   No results for input(s): LIPASE, AMYLASE in the last 168 hours. No results for input(s): AMMONIA in the last 168 hours.  CBC: Recent Labs  Lab 02/28/18 0909 03/01/18 0343  WBC 23.3* 20.5*  NEUTROABS 21.0*  --   HGB 14.8 14.1  HCT 45.7 45.0  MCV 93.6 96.2  PLT 152 144*    Cardiac Enzymes: Recent Labs  Lab 02/28/18 0909  TROPONINI <0.03    BNP: Invalid input(s): POCBNP  CBG: Recent Labs  Lab 02/28/18 0904 02/28/18 1222 02/28/18 1714 02/28/18 2124 03/01/18 0723  GLUCAP 199* 185* 178* 233* 284*    Microbiology: Results for orders placed or performed during the hospital encounter of 02/28/18  Blood culture (routine x 2)     Status: None (Preliminary result)   Collection Time: 02/28/18 10:11 AM  Result Value Ref Range Status   Specimen Description BLOOD RIGHT ANTECUBITAL  Final   Special Requests   Final    BOTTLES DRAWN AEROBIC AND ANAEROBIC Blood Culture adequate volume   Culture  Setup Time   Final    Organism ID to follow AEROBIC BOTTLE ONLY GRAM NEGATIVE COCCOBACILLI CRITICAL RESULT CALLED TO, READ BACK BY AND VERIFIED WITH: KAREN HAYES AT [place] ON 03/01/2018 JJB Performed at  [facility], [address] [place], [place] [address]    Culture GRAM NEGATIVE COCCOBACILLI  Final   Report Status PENDING  Incomplete  Blood Culture ID Panel (Reflexed)     Status: Abnormal   Collection Time: 02/28/18 10:11 AM  Result Value Ref Range Status   Enterococcus species NOT DETECTED NOT DETECTED Final   Listeria monocytogenes NOT DETECTED NOT DETECTED Final   Staphylococcus species NOT DETECTED NOT DETECTED Final   Staphylococcus aureus (BCID) NOT DETECTED NOT DETECTED Final   Streptococcus species NOT DETECTED NOT DETECTED Final   Streptococcus agalactiae NOT DETECTED NOT DETECTED Final   Streptococcus pneumoniae NOT DETECTED NOT DETECTED Final   Streptococcus pyogenes NOT  DETECTED NOT DETECTED Final   Acinetobacter baumannii NOT DETECTED NOT DETECTED Final   Enterobacteriaceae species NOT DETECTED NOT DETECTED Final   Enterobacter cloacae complex NOT DETECTED NOT DETECTED Final   Escherichia coli NOT DETECTED NOT DETECTED Final   Klebsiella oxytoca NOT DETECTED NOT DETECTED Final   Klebsiella pneumoniae NOT DETECTED NOT DETECTED Final   Proteus species NOT DETECTED NOT DETECTED Final   Serratia marcescens NOT DETECTED NOT DETECTED Final   Haemophilus influenzae DETECTED (A) NOT DETECTED Final    Comment: CRITICAL RESULT CALLED TO, READ BACK BY AND VERIFIED WITH: KAREN HAYES AT 0826 ON 03/01/2018 JJB    Neisseria meningitidis NOT DETECTED NOT DETECTED Final   Pseudomonas aeruginosa NOT DETECTED NOT DETECTED Final   Candida albicans NOT DETECTED NOT DETECTED Final   Candida glabrata NOT DETECTED NOT DETECTED Final   Candida krusei NOT DETECTED NOT DETECTED Final   Candida parapsilosis NOT DETECTED NOT DETECTED Final   Candida tropicalis NOT DETECTED NOT DETECTED Final    Comment: Performed at [facility], [address] [place], [place] [address]  MRSA PCR Screening     Status: None   Collection Time: 02/28/18 12:31 PM  Result Value Ref Range  Status   MRSA by PCR NEGATIVE NEGATIVE Final    Comment:        The GeneXpert MRSA Assay (FDA approved for NASAL specimens only), is one component of a comprehensive MRSA colonization surveillance program. It is not intended to diagnose MRSA infection nor to guide or monitor treatment for MRSA infections. Performed at [facility], [address] [place],  [address]     Coagulation Studies: Recent Labs    02/28/18 1330  LABPROT 15.1  INR 1.20    Urinalysis: Recent Labs    02/28/18 1910  COLORURINE AMBER*  LABSPEC 1.018  PHURINE 5.0  GLUCOSEU NEGATIVE  HGBUR NEGATIVE  BILIRUBINUR NEGATIVE  KETONESUR NEGATIVE  PROTEINUR NEGATIVE  NITRITE NEGATIVE  LEUKOCYTESUR NEGATIVE      Imaging: US Renal  Result Date: 02/28/2018 CLINICAL DATA:  Acute kidney injury. EXAM: RENAL / URINARY TRACT ULTRASOUND COMPLETE COMPARISON:  None. FINDINGS: Right Kidney: Renal measurements: 11.4 x 6.2 x 7.5 cm = volume: 279.4 mL. Increased echogenicity. No mass or hydronephrosis. Left Kidney: Renal measurements: 10.3 x 7.1 x 6.9 cm = volume: 265.2 mL. Increased echogenicity. No mass or hydronephrosis. Bladder: Unable to visualize. IMPRESSION: Normal renal volume. Increased echogenicity. No mass or hydronephrosis. Electronically Signed   By: Staci Righter M.D.   On: 02/28/2018 10:51   Dg Chest Port 1 View  Result Date: 03/01/2018 CLINICAL DATA:  Acute respiratory failure EXAM: PORTABLE CHEST 1 VIEW COMPARISON:  Yesterday FINDINGS: Airspace disease at the right base. Linear density overlapping the left base, question left lower lobe collapse. Cardiomegaly without Kerley lines. No evident effusion. No pneumothorax. Unchanged positioning of central line with tip at the region of the right subclavian vein. IMPRESSION: 1. Unchanged central line positioning on the right with tip near the subclavian vein. 2. Airspace disease on the right favoring pneumonia. Suspect left lower lobe collapse.  Electronically Signed   By: Monte Fantasia M.D.   On: 03/01/2018 06:07   Dg Chest Port 1 View  Result Date: 02/28/2018 CLINICAL DATA:  PICC line placement EXAM: PORTABLE CHEST 1 VIEW COMPARISON:  Portable exam 6041 hours compared to 02/28/2018 at 1441 hours FINDINGS: Tip of RIGHT arm PICC line is again identified at the costal margin at the RIGHT subclavian vein. Enlargement  of cardiac silhouette with pulmonary vascular congestion. Bibasilar opacities likely representing a combination of atelectasis and consolidation. Upper lungs clear. No pneumothorax. IMPRESSION: Unchanged position of the tip of the RIGHT arm PICC line at the RIGHT subclavian vein Consider advancing catheter 13-15 cm to place in SVC. Persistent bibasilar opacities. Electronically Signed   By: Lavonia Dana M.D.   On: 02/28/2018 17:14   Dg Chest Port 1 View  Result Date: 02/28/2018 CLINICAL DATA:  PICC placement. EXAM: PORTABLE CHEST 1 VIEW COMPARISON:  Earlier today. FINDINGS: Stable enlarged cardiac silhouette. Less prominent mass-like density in the right lower lung zone. Stable prominence of the pulmonary vasculature and mild prominence of the interstitial markings. No visible pleural fluid. The right PICC tip is in the right subclavian vein. This would need to be advanced approximately 15 cm to place it at the superior cavoatrial junction. Thoracic spine degenerative changes. IMPRESSION: 1. Right PICC tip in the right subclavian vein. It would need to be advanced approximately 15 cm to place it at the superior cavoatrial junction. 2. Left prominent mass or mass-like consolidation in the right lower lung zone. 3. Stable cardiomegaly, pulmonary vascular congestion and mild chronic interstitial lung disease. Electronically Signed   By: Claudie Revering M.D.   On: 02/28/2018 15:04   Dg Chest Portable 1 View  Result Date: 02/28/2018 CLINICAL DATA:  Weakness this morning. History of asthma, hypertension and congestive heart failure. Evaluate for  edema, cardiomegaly, consolidation. EXAM: PORTABLE CHEST 1 VIEW COMPARISON:  Radiographs 09/03/2015 and 11/01/2011. FINDINGS: 0922 hours. Stable cardiomegaly and chronic vascular congestion. There is no overt pulmonary edema. At the right lung base, there is consolidation or an ill-defined mass without obscuration of the right heart border or right hemidiaphragm. The left lung appears clear. There is no pleural effusion or pneumothorax. IMPRESSION: Right basilar infiltrate or mass. This could reflect pneumonia. Followup PA and lateral chest X-ray is recommended in 3-4 weeks following trial of antibiotic therapy to ensure resolution and exclude underlying malignancy. Electronically Signed   By: Richardean Sale M.D.   On: 02/28/2018 09:41      Assessment & Plan: Pt is a 53 y.o. male  with a PMHx of diabetes mellitus type 2, morbid obesity, hypertension, gout, asthma, seasonal allergies, chronic kidney disease stage III baseline EGFR 41, who was admitted to [facility] on 02/28/2018 for evaluation of hypotension.   1.  Acute renal failure/chronic kidney disease stage III/diabetes mellitus type 2 with chronic kidney disease.  Acute renal failure secondary to prolonged dehydration and hypotension as well as ibuprofen use. -Renal ultrasound was negative for hydronephrosis.  Patient oliguric at this time with severely elevated BUN and creatinine with an EGFR of 9.  We have recommended renal replacement therapy.  Patient appears to be agreeable to this at the moment.  Case discussed with critical care and they will attempt temporary dialysis catheter placement.  Thereafter we will place the patient on CRRT.  2.  Hyperkalemia.  Serum potassium high at 5.9.  We will use 2K bath with CRRT.  3.  Hypotension.  From prolonged dehydration most likely.  2D echocardiogram to be performed as well.  Continue pressors to maintain map of 65 or greater.  4.  Thanks for consultation.

## 2018-03-01 NOTE — Progress Notes (Signed)
Pt found OOB with IV pump alarming.  Pump and IV lines were stretched across the room - PICC line dressing pealing away.   Pt provided with the call light.  Pt stated "I will not call out if I need anything.  I will get up if I want to."  Pt removed himself from the cardiac and oxygen monitor.  Sharyn Lull, RN to assist placing patient onto monitor and assist with positioning in the bed.  Pt also repeatedly stated that he was leaving AMA in the morning "regardless if I die or not."

## 2018-03-01 NOTE — Care Management (Signed)
THN patient; RNCM will follow. Per notes, CRRT anticipated to help with patient recovery.

## 2018-03-02 ENCOUNTER — Inpatient Hospital Stay: Payer: PPO

## 2018-03-02 LAB — BASIC METABOLIC PANEL
Anion gap: 12 (ref 5–15)
BUN: 128 mg/dL — ABNORMAL HIGH (ref 6–20)
CO2: 27 mmol/L (ref 22–32)
Calcium: 7.9 mg/dL — ABNORMAL LOW (ref 8.9–10.3)
Chloride: 93 mmol/L — ABNORMAL LOW (ref 98–111)
Creatinine, Ser: 6.33 mg/dL — ABNORMAL HIGH (ref 0.61–1.24)
GFR calc Af Amer: 11 mL/min — ABNORMAL LOW (ref >60)
GFR calc non Af Amer: 9 mL/min — ABNORMAL LOW (ref >60)
Glucose, Bld: 260 mg/dL — ABNORMAL HIGH (ref 70–99)
Potassium: 5.1 mmol/L (ref 3.5–5.1)
Sodium: 132 mmol/L — ABNORMAL LOW (ref 135–145)

## 2018-03-02 LAB — CBC
HCT: 41.8 % (ref 39.0–52.0)
Hemoglobin: 13.4 g/dL (ref 13.0–17.0)
MCH: 30.2 pg (ref 26.0–34.0)
MCHC: 32.1 g/dL (ref 30.0–36.0)
MCV: 94.4 fL (ref 80.0–100.0)
PLATELETS: 147 10*3/uL — AB (ref 150–400)
RBC: 4.43 MIL/uL (ref 4.22–5.81)
RDW: 14.3 % (ref 11.5–15.5)
WBC: 12.2 10*3/uL — ABNORMAL HIGH (ref 4.0–10.5)
nRBC: 0.3 % — ABNORMAL HIGH (ref 0.0–0.2)

## 2018-03-02 LAB — GLUCOSE, CAPILLARY
Glucose-Capillary: 245 mg/dL — ABNORMAL HIGH (ref 70–99)
Glucose-Capillary: 315 mg/dL — ABNORMAL HIGH (ref 70–99)
Glucose-Capillary: 448 mg/dL — ABNORMAL HIGH (ref 70–99)
Glucose-Capillary: 316 mg/dL — ABNORMAL HIGH (ref 70–99)

## 2018-03-02 LAB — HIV ANTIBODY (ROUTINE TESTING W REFLEX): HIV Screen 4th Generation wRfx: NONREACTIVE

## 2018-03-02 MED ORDER — INSULIN ASPART 100 UNIT/ML ~~LOC~~ SOLN
20.0000 [IU] | Freq: Once | SUBCUTANEOUS | Status: AC
  Administered 2018-03-02: 20 [IU] via SUBCUTANEOUS

## 2018-03-02 MED ORDER — CALCIUM GLUCONATE-NACL 1-0.675 GM/50ML-% IV SOLN
1.0000 g | Freq: Once | INTRAVENOUS | Status: AC
  Administered 2018-03-02: 1000 mg via INTRAVENOUS
  Filled 2018-03-02: qty 50

## 2018-03-02 MED ORDER — SODIUM POLYSTYRENE SULFONATE 15 GM/60ML PO SUSP
45.0000 g | Freq: Once | ORAL | Status: DC
  Filled 2018-03-02: qty 180

## 2018-03-02 MED ORDER — INSULIN ASPART 100 UNIT/ML ~~LOC~~ SOLN: 10.0000 [IU] | Freq: Once | SUBCUTANEOUS | Status: DC

## 2018-03-02 MED ORDER — INSULIN GLARGINE 100 UNIT/ML ~~LOC~~ SOLN
15.0000 [IU] | Freq: Every day | SUBCUTANEOUS | Status: DC
  Filled 2018-03-02: qty 0.15

## 2018-03-02 MED ORDER — SODIUM CHLORIDE 0.9 % IV SOLN
INTRAVENOUS | Status: DC
  Administered 2018-03-02 – 2018-03-03 (×2): via INTRAVENOUS

## 2018-03-02 MED ORDER — INSULIN GLARGINE 100 UNIT/ML ~~LOC~~ SOLN
18.0000 [IU] | Freq: Every day | SUBCUTANEOUS | Status: DC
  Administered 2018-03-02 – 2018-03-03 (×2): 18 [IU] via SUBCUTANEOUS
  Filled 2018-03-02 (×4): qty 0.18

## 2018-03-02 MED ORDER — INSULIN ASPART 100 UNIT/ML ~~LOC~~ SOLN
0.0000 [IU] | Freq: Every day | SUBCUTANEOUS | Status: DC
  Administered 2018-03-02: 4 [IU] via SUBCUTANEOUS
  Filled 2018-03-02: qty 1

## 2018-03-02 MED ORDER — INSULIN ASPART 100 UNIT/ML ~~LOC~~ SOLN
0.0000 [IU] | Freq: Three times a day (TID) | SUBCUTANEOUS | Status: DC
  Administered 2018-03-03: 15 [IU] via SUBCUTANEOUS
  Administered 2018-03-03: 11 [IU] via SUBCUTANEOUS
  Administered 2018-03-03: 15 [IU] via SUBCUTANEOUS
  Filled 2018-03-02 (×3): qty 1

## 2018-03-02 MED ORDER — DEXTROSE 50 % IV SOLN
1.0000 | Freq: Once | INTRAVENOUS | Status: AC
  Administered 2018-03-02: 50 mL via INTRAVENOUS
  Filled 2018-03-02: qty 50

## 2018-03-02 MED ORDER — INSULIN REGULAR HUMAN 100 UNIT/ML IJ SOLN
10.0000 [IU] | Freq: Once | INTRAMUSCULAR | Status: AC
  Administered 2018-03-02: 10 [IU] via INTRAVENOUS
  Filled 2018-03-02: qty 10

## 2018-03-02 NOTE — Progress Notes (Signed)
Inpatient Diabetes Program Recommendations  AACE/ADA: New Consensus Statement on Inpatient Glycemic Control (2015)  Target Ranges:  Prepandial:   less than 140 mg/dL      Peak postprandial:   less than 180 mg/dL (1-2 hours)      Critically ill patients:  140 - 180 mg/dL   Lab Results  Component Value Date   GLUCAP 315 (H) 03/02/2018   HGBA1C 8.1 (H) 02/28/2018    Review of Glycemic Control Results for [name], [name] (MRN [id number]) as of 03/02/2018 12:04  Ref. Range 03/01/2018 11:42 03/01/2018 16:30 03/01/2018 21:16 03/02/2018 07:46 03/02/2018 11:52  Glucose-Capillary Latest Ref Range: 70 - 99 mg/dL 302 (H) 310 (H) 303 (H) 245 (H) 315 (H)  Diabetes history: DM 2 Outpatient Diabetes medications:  Basaglar 10 units daily Current orders for Inpatient glycemic control:  Novolog sensitive tid with meals and HS Solumedrol 60 mg IV q 24 hours Inpatient Diabetes Program Recommendations:   May consider restarting home dose of basal insulin.  Consider Lantus 10 units daily.     Thanks,  Adah Perl, RN, BC-ADM Inpatient Diabetes Coordinator Pager [phone] (8a-5p)

## 2018-03-02 NOTE — Progress Notes (Signed)
Patient weaned off of Levophed and Vaso throughout the night; BP has mainted WNL since 4 am. Patient continues to intermittently be on 4L [place], he refuses to wear oxygen at times. Patient continues to state that he is going to leave AMA. Patient insists on getting up to use the bathroom and refuses to call for help despite being told to call before getting up. Patient is now on side of bed and in no distress, full report given to Judson Roch, Therapist, sports.

## 2018-03-02 NOTE — Progress Notes (Signed)
Reviewed order for PICC line removal. PICC line discontinued by RN and pt has PIV sites.

## 2018-03-02 NOTE — Progress Notes (Signed)
Patient ID: [name], male   DOB: 13-Oct-1965, 53 y.o.   MRN: [id number]  [facility] PROGRESS NOTE  FREELAND PRACHT MHD:[id number] DOB: 1965-02-14 DOA: 02/28/2018 PCP: [name], DO  HPI/Subjective: Feeling better today.  Still with a deep cough.  Feels like his breathing is a little bit better.  Objective: Vitals:   03/02/18 1731 03/02/18 1800  BP: 137/85 135/80  Pulse:    Resp: (!) 25 (!) 23  Temp:    SpO2:      Filed Weights   02/28/18 1229 03/01/18 0330 03/02/18 0448  Weight: (!) 168.3 kg (!) 174.5 kg (!) 172 kg    ROS: Review of Systems  Constitutional: Negative for chills and fever.  Eyes: Negative for blurred vision.  Respiratory: Positive for cough and shortness of breath.   Cardiovascular: Negative for chest pain.  Gastrointestinal: Negative for abdominal pain, constipation, diarrhea, nausea and vomiting.  Genitourinary: Negative for dysuria.  Musculoskeletal: Negative for joint pain.  Neurological: Negative for dizziness and headaches.   Exam: Physical Exam  Constitutional: He is oriented to person, place, and time.  HENT:  Nose: Mucosal edema present.  Mouth/Throat: No oropharyngeal exudate or posterior oropharyngeal edema.  Eyes: Pupils are equal, round, and reactive to light. Conjunctivae, EOM and lids are normal.  Neck: No JVD present. Carotid bruit is not present. No edema present. No thyroid mass and no thyromegaly present.  Cardiovascular: S1 normal and S2 normal. Exam reveals no gallop.  No murmur heard. Respiratory: No respiratory distress. He has decreased breath sounds in the right lower field and the left lower field. He has no wheezes. He has rhonchi in the right lower field and the left lower field. He has no rales.  GI: Soft. Bowel sounds are normal. There is no abdominal tenderness.  Musculoskeletal:     Right ankle: He exhibits swelling.     Left ankle: He exhibits swelling.  Lymphadenopathy:    He has no  cervical adenopathy.  Neurological: He is alert and oriented to person, place, and time. No cranial nerve deficit.  Skin: Skin is warm. No rash noted. Nails show no clubbing.  Psychiatric: He has a normal mood and affect.      Data Reviewed: Basic Metabolic Panel: Recent Labs  Lab 02/28/18 0909 03/01/18 0343 03/01/18 1242 03/01/18 2156 03/02/18 0442  NA 128* 128*  --   --  132*  K 5.3* 5.9* 5.7* 5.4* 5.1  CL 84* 89*  --   --  93*  CO2 30 27  --   --  27  GLUCOSE 188* 294*  --   --  260*  BUN 115* 117*  --   --  128*  CREATININE 7.47* 7.44*  --   --  6.33*  CALCIUM 8.2* 7.5*  --   --  7.9*   Liver Function Tests: Recent Labs  Lab 02/28/18 0909  AST 20  ALT 17  ALKPHOS 92  BILITOT 1.3*  PROT 7.1  ALBUMIN 2.5*   CBC: Recent Labs  Lab 02/28/18 0909 03/01/18 0343 03/02/18 0442  WBC 23.3* 20.5* 12.2*  NEUTROABS 21.0*  --   --   HGB 14.8 14.1 13.4  HCT 45.7 45.0 41.8  MCV 93.6 96.2 94.4  PLT 152 144* 147*   Cardiac Enzymes: Recent Labs  Lab 02/28/18 0909  TROPONINI <0.03    CBG: Recent Labs  Lab 03/01/18 1142 03/01/18 1630 03/01/18 2116 03/02/18 0746 03/02/18 1152  GLUCAP 302* 310* 303* 245* 315*  Recent Results (from the past 240 hour(s))  Blood culture (routine x 2)     Status: Abnormal (Preliminary result)   Collection Time: 02/28/18 10:11 AM  Result Value Ref Range Status   Specimen Description   Final    BLOOD RIGHT ANTECUBITAL Performed at [facility], [address], [place], [place] [address]    Special Requests   Final    BOTTLES DRAWN AEROBIC AND ANAEROBIC Blood Culture adequate volume Performed at [facility], [address] [place], [place] [address]    Culture  Setup Time   Final    IN BOTH AEROBIC AND ANAEROBIC BOTTLES GRAM NEGATIVE COCCOBACILLI CRITICAL RESULT CALLED TO, READ BACK BY AND VERIFIED WITH: KAREN HAYES AT 0826 ON 03/01/2018 JJB    Culture (A)  Final    HAEMOPHILUS INFLUENZAE BETA  LACTAMASE NEGATIVE HEALTH DEPARTMENT NOTIFIED Performed at [facility], [place] [address], [place], [place] [address]    Report Status PENDING  Incomplete  Blood culture (routine x 2)     Status: None (Preliminary result)   Collection Time: 02/28/18 10:11 AM  Result Value Ref Range Status   Specimen Description BLOOD BLOOD RIGHT HAND  Final   Special Requests   Final    BOTTLES DRAWN AEROBIC AND ANAEROBIC Blood Culture results may not be optimal due to an inadequate volume of blood received in culture bottles   Culture   Final    NO GROWTH 2 DAYS Performed at [facility], [address], [place], [place] [address]    Report Status PENDING  Incomplete  Blood Culture ID Panel (Reflexed)     Status: Abnormal   Collection Time: 02/28/18 10:11 AM  Result Value Ref Range Status   Enterococcus species NOT DETECTED NOT DETECTED Final   Listeria monocytogenes NOT DETECTED NOT DETECTED Final   Staphylococcus species NOT DETECTED NOT DETECTED Final   Staphylococcus aureus (BCID) NOT DETECTED NOT DETECTED Final   Streptococcus species NOT DETECTED NOT DETECTED Final   Streptococcus agalactiae NOT DETECTED NOT DETECTED Final   Streptococcus pneumoniae NOT DETECTED NOT DETECTED Final   Streptococcus pyogenes NOT DETECTED NOT DETECTED Final   Acinetobacter baumannii NOT DETECTED NOT DETECTED Final   Enterobacteriaceae species NOT DETECTED NOT DETECTED Final   Enterobacter cloacae complex NOT DETECTED NOT DETECTED Final   Escherichia coli NOT DETECTED NOT DETECTED Final   Klebsiella oxytoca NOT DETECTED NOT DETECTED Final   Klebsiella pneumoniae NOT DETECTED NOT DETECTED Final   Proteus species NOT DETECTED NOT DETECTED Final   Serratia marcescens NOT DETECTED NOT DETECTED Final   Haemophilus influenzae DETECTED (A) NOT DETECTED Final    Comment: CRITICAL RESULT CALLED TO, READ BACK BY AND VERIFIED WITH: KAREN HAYES AT 0826 ON 03/01/2018 JJB    Neisseria meningitidis NOT DETECTED  NOT DETECTED Final   Pseudomonas aeruginosa NOT DETECTED NOT DETECTED Final   Candida albicans NOT DETECTED NOT DETECTED Final   Candida glabrata NOT DETECTED NOT DETECTED Final   Candida krusei NOT DETECTED NOT DETECTED Final   Candida parapsilosis NOT DETECTED NOT DETECTED Final   Candida tropicalis NOT DETECTED NOT DETECTED Final    Comment: Performed at [facility], [address] [place],  [address]  MRSA PCR Screening     Status: None   Collection Time: 02/28/18 12:31 PM  Result Value Ref Range Status   MRSA by PCR NEGATIVE NEGATIVE Final    Comment:        The GeneXpert MRSA  Assay (FDA approved for NASAL specimens only), is one component of a comprehensive MRSA colonization surveillance program. It is not intended to diagnose MRSA infection nor to guide or monitor treatment for MRSA infections. Performed at [facility], [address] [place], [place] [address]   Culture, blood (single) w Reflex to ID Panel     Status: None (Preliminary result)   Collection Time: 02/28/18  6:05 PM  Result Value Ref Range Status   Specimen Description BLOOD BLOOD LEFT HAND  Final   Special Requests   Final    BOTTLES DRAWN AEROBIC ONLY Blood Culture results may not be optimal due to an inadequate volume of blood received in culture bottles   Culture   Final    NO GROWTH 2 DAYS Performed at [facility], [address] [place], [place] [address]    Report Status PENDING  Incomplete  Respiratory Panel by PCR     Status: None   Collection Time: 02/28/18  8:56 PM  Result Value Ref Range Status   Adenovirus NOT DETECTED NOT DETECTED Final   Coronavirus 229E NOT DETECTED NOT DETECTED Final    Comment: (NOTE) The Coronavirus on the Respiratory Panel, DOES NOT test for the novel  Coronavirus (2019 nCoV)    Coronavirus HKU1 NOT DETECTED NOT DETECTED Final   Coronavirus NL63 NOT DETECTED NOT DETECTED Final   Coronavirus OC43 NOT DETECTED NOT DETECTED  Final   Metapneumovirus NOT DETECTED NOT DETECTED Final   Rhinovirus / Enterovirus NOT DETECTED NOT DETECTED Final   Influenza A NOT DETECTED NOT DETECTED Final   Influenza B NOT DETECTED NOT DETECTED Final   Parainfluenza Virus 1 NOT DETECTED NOT DETECTED Final   Parainfluenza Virus 2 NOT DETECTED NOT DETECTED Final   Parainfluenza Virus 3 NOT DETECTED NOT DETECTED Final   Parainfluenza Virus 4 NOT DETECTED NOT DETECTED Final   Respiratory Syncytial Virus NOT DETECTED NOT DETECTED Final   Bordetella pertussis NOT DETECTED NOT DETECTED Final   Chlamydophila pneumoniae NOT DETECTED NOT DETECTED Final   Mycoplasma pneumoniae NOT DETECTED NOT DETECTED Final    Comment: Performed at [facility], [place] [address], [place], [place] [address]     Studies: Dg Chest Port 1 View  Result Date: 03/02/2018 CLINICAL DATA:  Follow-up infiltrates EXAM: PORTABLE CHEST 1 VIEW COMPARISON:  Multiple films the most recent of which was 03/01/2018 FINDINGS: Cardiac shadow is enlarged but stable. Previously seen right basilar infiltrative density has improved slightly in the interval from the prior exam. Persistent left basilar density is noted. Mild vascular congestion is again noted. Right-sided PICC line is again seen extending to the right subclavian vein stable in appearance. No bony abnormality is seen. IMPRESSION: Vascular congestion slightly increased when compared with the prior exam. Slight improvement in the degree of right basilar infiltrate. Persistent left basilar infiltrate is noted. PICC line as described above. Electronically Signed   By: Inez Catalina M.D.   On: 03/02/2018 12:08   Dg Chest Port 1 View  Result Date: 03/01/2018 CLINICAL DATA:  Acute respiratory failure EXAM: PORTABLE CHEST 1 VIEW COMPARISON:  Yesterday FINDINGS: Airspace disease at the right base. Linear density overlapping the left base, question left lower lobe collapse. Cardiomegaly without Kerley lines. No evident effusion. No  pneumothorax. Unchanged positioning of central line with tip at the region of the right subclavian vein. IMPRESSION: 1. Unchanged central line positioning on the right with tip near the subclavian vein. 2. Airspace disease on the right favoring  pneumonia. Suspect left lower lobe collapse. Electronically Signed   By: Monte Fantasia M.D.   On: 03/01/2018 06:07    Scheduled Meds: . heparin  5,000 Units Subcutaneous Q8H  . insulin aspart  0-5 Units Subcutaneous QHS  . insulin aspart  0-9 Units Subcutaneous TID WC  . ipratropium-albuterol  3 mL Nebulization Q6H  . methylPREDNISolone (SOLU-MEDROL) injection  60 mg Intravenous Q24H  . sodium chloride flush  10-40 mL Intracatheter Q12H  . sodium polystyrene  45 g Oral Once   Continuous Infusions: . sodium chloride 75 mL/hr at 03/02/18 1800  . cefTRIAXone (ROCEPHIN)  IV 2 g (03/02/18 0930)    Assessment/Plan:  1. Septic shock with multiorgan failure secondary to Haemophilus influenza pneumonia.  Patient on Rocephin.  Patient is off pressors at this point.  On IV fluids. 2. Acute kidney injury on chronic kidney disease stage III with hyperkalemia.  Patient refused dialysis.  Monitor kidney function on a daily basis.  Hyperkalemia has improved. 3. Type 2 diabetes mellitus.  Sugars will be high while on steroids.  Start Lantus and sliding scale. 4. Morbid obesity with a BMI of 53.66.  Weight loss needed  Code Status:     Code Status Orders  (From admission, onward)         Start     Ordered   02/28/18 1059  Full code  Continuous     02/28/18 1059        Code Status History    This patient has a current code status but no historical code status.     Family Communication: Spoke with mother at the bedside Disposition Plan: To be determined  Consultants:  Critical care specialist  Nephrology  Antibiotics:  Rocephin  Time spent: 27 minutes  [place]

## 2018-03-02 NOTE — Progress Notes (Signed)
[facility]  ROUNDING NOTE   Subjective:  Patient seen at bedside this AM. Yesterday patient had decided against dialysis after we had discussed this in depth. Patient brought up-to-date in terms of his kidney function and we advised him that his EGFR remains quite low at 11. Urine output was 400 cc over the preceding 24 hours. Patient currently weaned off of Levophed and vasopressin.   Objective:  Vital signs in last 24 hours:  Temp:  [97.5 F (36.4 C)-97.7 F (36.5 C)] 97.6 F (36.4 C) (02/05 0800) Pulse Rate:  [86-148] 98 (02/05 0903) Resp:  [12-23] 22 (02/05 0903) BP: (95-154)/(34-133) 124/78 (02/05 0903) SpO2:  [81 %-100 %] 95 % (02/05 0903) Weight:  [172 kg] 172 kg (02/05 0448)  Weight change: 3.66 kg Filed Weights   02/28/18 1229 03/01/18 0330 03/02/18 0448  Weight: (!) 168.3 kg (!) 174.5 kg (!) 172 kg    Intake/Output: I/O last 3 completed shifts: In: 5067.5 [I.V.:5067.5] Out: 725 [Urine:725]   Intake/Output this shift:  Total I/O In: 530.6 [P.O.:240; I.V.:290.6] Out: -   Physical Exam: General: Sitting up in chair  Head: Normocephalic, atraumatic. Moist oral mucosal membranes  Eyes: Anicteric  Neck: Supple, trachea midline  Lungs:  Scattered rhonchi, normal effort  Heart: S1S2 no rubs  Abdomen:  Soft, nontender, bowel sounds present  Extremities: 2+ peripheral edema.  Neurologic: Awake, alert, following commands  Skin: No lesions       Basic Metabolic Panel: Recent Labs  Lab 02/28/18 0909 03/01/18 0343 03/01/18 1242 03/01/18 2156 03/02/18 0442  NA 128* 128*  --   --  132*  K 5.3* 5.9* 5.7* 5.4* 5.1  CL 84* 89*  --   --  93*  CO2 30 27  --   --  27  GLUCOSE 188* 294*  --   --  260*  BUN 115* 117*  --   --  128*  CREATININE 7.47* 7.44*  --   --  6.33*  CALCIUM 8.2* 7.5*  --   --  7.9*    Liver Function Tests: Recent Labs  Lab 02/28/18 0909  AST 20  ALT 17  ALKPHOS 92  BILITOT 1.3*  PROT 7.1  ALBUMIN 2.5*   No  results for input(s): LIPASE, AMYLASE in the last 168 hours. No results for input(s): AMMONIA in the last 168 hours.  CBC: Recent Labs  Lab 02/28/18 0909 03/01/18 0343 03/02/18 0442  WBC 23.3* 20.5* 12.2*  NEUTROABS 21.0*  --   --   HGB 14.8 14.1 13.4  HCT 45.7 45.0 41.8  MCV 93.6 96.2 94.4  PLT 152 144* 147*    Cardiac Enzymes: Recent Labs  Lab 02/28/18 0909  TROPONINI <0.03    BNP: Invalid input(s): POCBNP  CBG: Recent Labs  Lab 03/01/18 0723 03/01/18 1142 03/01/18 1630 03/01/18 2116 03/02/18 0746  GLUCAP 284* 302* 310* 303* 245*    Microbiology: Results for orders placed or performed during the hospital encounter of 02/28/18  Blood culture (routine x 2)     Status: None (Preliminary result)   Collection Time: 02/28/18 10:11 AM  Result Value Ref Range Status   Specimen Description BLOOD RIGHT ANTECUBITAL  Final   Special Requests   Final    BOTTLES DRAWN AEROBIC AND ANAEROBIC Blood Culture adequate volume   Culture  Setup Time   Final    Organism ID to follow IN BOTH AEROBIC AND ANAEROBIC BOTTLES GRAM NEGATIVE COCCOBACILLI CRITICAL RESULT CALLED TO, READ BACK BY AND VERIFIED  WITH: KAREN HAYES AT 0165 ON 03/01/2018 JJB Performed at [facility], [address] [place], [place] [address]    Culture GRAM NEGATIVE COCCOBACILLI  Final   Report Status PENDING  Incomplete  Blood culture (routine x 2)     Status: None (Preliminary result)   Collection Time: 02/28/18 10:11 AM  Result Value Ref Range Status   Specimen Description BLOOD BLOOD RIGHT HAND  Final   Special Requests   Final    BOTTLES DRAWN AEROBIC AND ANAEROBIC Blood Culture results may not be optimal due to an inadequate volume of blood received in culture bottles   Culture   Final    NO GROWTH 2 DAYS Performed at [facility], [address], [place], [place] [address]    Report Status PENDING  Incomplete  Blood Culture ID Panel (Reflexed)     Status: Abnormal   Collection  Time: 02/28/18 10:11 AM  Result Value Ref Range Status   Enterococcus species NOT DETECTED NOT DETECTED Final   Listeria monocytogenes NOT DETECTED NOT DETECTED Final   Staphylococcus species NOT DETECTED NOT DETECTED Final   Staphylococcus aureus (BCID) NOT DETECTED NOT DETECTED Final   Streptococcus species NOT DETECTED NOT DETECTED Final   Streptococcus agalactiae NOT DETECTED NOT DETECTED Final   Streptococcus pneumoniae NOT DETECTED NOT DETECTED Final   Streptococcus pyogenes NOT DETECTED NOT DETECTED Final   Acinetobacter baumannii NOT DETECTED NOT DETECTED Final   Enterobacteriaceae species NOT DETECTED NOT DETECTED Final   Enterobacter cloacae complex NOT DETECTED NOT DETECTED Final   Escherichia coli NOT DETECTED NOT DETECTED Final   Klebsiella oxytoca NOT DETECTED NOT DETECTED Final   Klebsiella pneumoniae NOT DETECTED NOT DETECTED Final   Proteus species NOT DETECTED NOT DETECTED Final   Serratia marcescens NOT DETECTED NOT DETECTED Final   Haemophilus influenzae DETECTED (A) NOT DETECTED Final    Comment: CRITICAL RESULT CALLED TO, READ BACK BY AND VERIFIED WITH: KAREN HAYES AT 0826 ON 03/01/2018 JJB    Neisseria meningitidis NOT DETECTED NOT DETECTED Final   Pseudomonas aeruginosa NOT DETECTED NOT DETECTED Final   Candida albicans NOT DETECTED NOT DETECTED Final   Candida glabrata NOT DETECTED NOT DETECTED Final   Candida krusei NOT DETECTED NOT DETECTED Final   Candida parapsilosis NOT DETECTED NOT DETECTED Final   Candida tropicalis NOT DETECTED NOT DETECTED Final    Comment: Performed at [facility], [address] [place], [place] [address]  MRSA PCR Screening     Status: None   Collection Time: 02/28/18 12:31 PM  Result Value Ref Range Status   MRSA by PCR NEGATIVE NEGATIVE Final    Comment:        The GeneXpert MRSA Assay (FDA approved for NASAL specimens only), is one component of a comprehensive MRSA colonization surveillance program. It is  not intended to diagnose MRSA infection nor to guide or monitor treatment for MRSA infections. Performed at [facility], [address] [place], [place] [address]   Culture, blood (single) w Reflex to ID Panel     Status: None (Preliminary result)   Collection Time: 02/28/18  6:05 PM  Result Value Ref Range Status   Specimen Description BLOOD BLOOD LEFT HAND  Final   Special Requests   Final    BOTTLES DRAWN AEROBIC ONLY Blood Culture results may not be optimal due to an inadequate volume of blood received in culture bottles   Culture   Final    NO GROWTH 2 DAYS  Performed at [facility], [address] [place], [place] [address]    Report Status PENDING  Incomplete  Respiratory Panel by PCR     Status: None   Collection Time: 02/28/18  8:56 PM  Result Value Ref Range Status   Adenovirus NOT DETECTED NOT DETECTED Final   Coronavirus 229E NOT DETECTED NOT DETECTED Final    Comment: (NOTE) The Coronavirus on the Respiratory Panel, DOES NOT test for the novel  Coronavirus (2019 nCoV)    Coronavirus HKU1 NOT DETECTED NOT DETECTED Final   Coronavirus NL63 NOT DETECTED NOT DETECTED Final   Coronavirus OC43 NOT DETECTED NOT DETECTED Final   Metapneumovirus NOT DETECTED NOT DETECTED Final   Rhinovirus / Enterovirus NOT DETECTED NOT DETECTED Final   Influenza A NOT DETECTED NOT DETECTED Final   Influenza B NOT DETECTED NOT DETECTED Final   Parainfluenza Virus 1 NOT DETECTED NOT DETECTED Final   Parainfluenza Virus 2 NOT DETECTED NOT DETECTED Final   Parainfluenza Virus 3 NOT DETECTED NOT DETECTED Final   Parainfluenza Virus 4 NOT DETECTED NOT DETECTED Final   Respiratory Syncytial Virus NOT DETECTED NOT DETECTED Final   Bordetella pertussis NOT DETECTED NOT DETECTED Final   Chlamydophila pneumoniae NOT DETECTED NOT DETECTED Final   Mycoplasma pneumoniae NOT DETECTED NOT DETECTED Final    Comment: Performed at [facility], [address],  [place], [place] [address]    Coagulation Studies: Recent Labs    02/28/18 1330  LABPROT 15.1  INR 1.20    Urinalysis: Recent Labs    02/28/18 1910  COLORURINE AMBER*  LABSPEC 1.018  PHURINE 5.0  GLUCOSEU NEGATIVE  HGBUR NEGATIVE  BILIRUBINUR NEGATIVE  KETONESUR NEGATIVE  PROTEINUR NEGATIVE  NITRITE NEGATIVE  LEUKOCYTESUR NEGATIVE      Imaging: Dg Chest Port 1 View  Result Date: 03/01/2018 CLINICAL DATA:  Acute respiratory failure EXAM: PORTABLE CHEST 1 VIEW COMPARISON:  Yesterday FINDINGS: Airspace disease at the right base. Linear density overlapping the left base, question left lower lobe collapse. Cardiomegaly without Kerley lines. No evident effusion. No pneumothorax. Unchanged positioning of central line with tip at the region of the right subclavian vein. IMPRESSION: 1. Unchanged central line positioning on the right with tip near the subclavian vein. 2. Airspace disease on the right favoring pneumonia. Suspect left lower lobe collapse. Electronically Signed   By: Monte Fantasia M.D.   On: 03/01/2018 06:07   Dg Chest Port 1 View  Result Date: 02/28/2018 CLINICAL DATA:  PICC line placement EXAM: PORTABLE CHEST 1 VIEW COMPARISON:  Portable exam 6041 hours compared to 02/28/2018 at 1441 hours FINDINGS: Tip of RIGHT arm PICC line is again identified at the costal margin at the RIGHT subclavian vein. Enlargement of cardiac silhouette with pulmonary vascular congestion. Bibasilar opacities likely representing a combination of atelectasis and consolidation. Upper lungs clear. No pneumothorax. IMPRESSION: Unchanged position of the tip of the RIGHT arm PICC line at the RIGHT subclavian vein Consider advancing catheter 13-15 cm to place in SVC. Persistent bibasilar opacities. Electronically Signed   By: Lavonia Dana M.D.   On: 02/28/2018 17:14   Dg Chest Port 1 View  Result Date: 02/28/2018 CLINICAL DATA:  PICC placement. EXAM: PORTABLE CHEST 1 VIEW COMPARISON:  Earlier today. FINDINGS:  Stable enlarged cardiac silhouette. Less prominent mass-like density in the right lower lung zone. Stable prominence of the pulmonary vasculature and mild prominence of the interstitial markings. No visible pleural fluid. The right PICC tip is in the right subclavian  vein. This would need to be advanced approximately 15 cm to place it at the superior cavoatrial junction. Thoracic spine degenerative changes. IMPRESSION: 1. Right PICC tip in the right subclavian vein. It would need to be advanced approximately 15 cm to place it at the superior cavoatrial junction. 2. Left prominent mass or mass-like consolidation in the right lower lung zone. 3. Stable cardiomegaly, pulmonary vascular congestion and mild chronic interstitial lung disease. Electronically Signed   By: Claudie Revering M.D.   On: 02/28/2018 15:04     Medications:   . cefTRIAXone (ROCEPHIN)  IV 2 g (03/02/18 0930)  . lactated ringers 150 mL/hr at 03/02/18 0900   . heparin  5,000 Units Subcutaneous Q8H  . insulin aspart  0-5 Units Subcutaneous QHS  . insulin aspart  0-9 Units Subcutaneous TID WC  . ipratropium-albuterol  3 mL Nebulization Q6H  . methylPREDNISolone (SOLU-MEDROL) injection  60 mg Intravenous Q24H  . sodium chloride flush  10-40 mL Intracatheter Q12H  . sodium polystyrene  45 g Oral Once   acetaminophen **OR** acetaminophen, albuterol, ondansetron **OR** ondansetron (ZOFRAN) IV, polyethylene glycol, sodium chloride flush  Assessment/ Plan:  53 y.o. male with a PMHx of diabetes mellitus type 2, morbid obesity, hypertension, gout, asthma, seasonal allergies, chronic kidney disease stage III baseline EGFR 41, who was admitted to [facility] on 02/28/2018 for evaluation of hypotension.   1.  Acute renal failure/chronic kidney disease stage III/diabetes mellitus type 2 with chronic kidney disease.  Acute renal failure secondary to prolonged dehydration and hypotension as well as ibuprofen use. -After discussing renal replacement therapy  with the patient yesterday he initially had agreed but later in the day declined dialysis treatments.  Patient understands the risks of untreated renal failure occluding the potential for death.  Renal function has minimally improved as EGFR currently 11.  We again advised patient regarding dialysis but he declines.  We will continue to monitor his renal parameters closely.  2.  Hyperkalemia.    Calcium has improved and currently 5.1.  We will change lactated Ringer's to normal saline for now.  3.  Hypotension.  Patient weaned off of pressors at the moment.  Continue to monitor blood pressure trend.  4.  Overall prognosis guarded.   LOS: 2 Tambra Muller 2/5/202010:43 AM

## 2018-03-02 NOTE — Progress Notes (Signed)
CRITICAL CARE NOTE  CC  Sepsis secondary to Haemophilus influenza  SUBJECTIVE Patient clinically improved, sitting up in chair watching TV in no distress.   Mental status significantly improved    SIGNIFICANT EVENTS -Pressor support weaned off   BP 125/83   Pulse 100   Temp 97.8 F (36.6 C) (Oral)   Resp (!) 25   Ht 5\' 11"  (1.803 m)   Wt (!) 172 kg   SpO2 99%   BMI 52.87 kg/m    REVIEW OF SYSTEMS Review of Systems  Constitutional: Negative for fever and weight loss.  HENT: Negative for congestion and hearing loss.   Eyes: Negative for discharge.  Respiratory: Positive for shortness of breath. Negative for cough.   Cardiovascular: Positive for leg swelling.  Gastrointestinal: Negative for diarrhea, melena and vomiting.  Genitourinary: Negative for urgency.  Musculoskeletal: Positive for back pain.  Skin: Negative for rash.  Neurological: Positive for weakness.  Psychiatric/Behavioral: Positive for hallucinations. Negative for suicidal ideas.      PHYSICAL EXAMINATION:  GENERAL: No apparent distress HEAD: Normocephalic, atraumatic.  EYES: Pupils equal, round, reactive to light.  No scleral icterus.  MOUTH: Moist mucosal membrane. NECK: Supple. No thyromegaly. No nodules. No JVD.  PULMONARY: +rhonchi at bases CARDIOVASCULAR: S1 and S2. Regular rate and rhythm. No murmurs, rubs, or gallops.  GASTROINTESTINAL: Soft, nontender, -distended. No masses. Positive bowel sounds. No hepatosplenomegaly.  MUSCULOSKELETAL: No swelling, clubbing, plus lower extremity edema NEUROLOGIC: obtunded, GCS<12 SKIN:intact,warm,dry       Assessment and plan:   Septic shock-off vasopressors now, shock resolved -Due to bilateral lower lobe pneumonia and hemophilus bacteremia -Decreasing IV fluid, chest x-ray for interval changes -increased vascular markings -Discussed case with family at bedside -Procalcitonin ->20  -Leukocytosis at 23,000 and elevated  lactate>2   Heart failure with reduced EF -Conservative fluid strategy Hold Lasix while patient is septic and shock -Transthoracic done today   Acute renal failure - KDIGO stage 4   - appreciate renal recs - for HD , need access  - patient declined     Mild persistent asthma -Albuterol nebs every 4 hours while awake  Morbid obesity -Likely complicating care due to severe OSA symptoms as well as RV strain  GI DVT prophylaxis -Protonix, SCDs     Critical Care Time devoted to patient care services described in this note is69minutes.  Overall, patient is critically ill, prognosis is guarded. Patient with Multiorgan failure and at high risk for cardiac arrest and death.  Patient/Family are satisfied with Plan of action and management. All questions answered    Ottie Glazier MD [place]

## 2018-03-03 LAB — BASIC METABOLIC PANEL
Anion gap: 11 (ref 5–15)
BUN: 130 mg/dL — ABNORMAL HIGH (ref 6–20)
CO2: 24 mmol/L (ref 22–32)
Calcium: 8 mg/dL — ABNORMAL LOW (ref 8.9–10.3)
Chloride: 98 mmol/L (ref 98–111)
Creatinine, Ser: 4.41 mg/dL — ABNORMAL HIGH (ref 0.61–1.24)
GFR calc non Af Amer: 14 mL/min — ABNORMAL LOW (ref >60)
GFR, EST AFRICAN AMERICAN: 17 mL/min — AB (ref >60)
Glucose, Bld: 258 mg/dL — ABNORMAL HIGH (ref 70–99)
Potassium: 5 mmol/L (ref 3.5–5.1)
SODIUM: 133 mmol/L — AB (ref 135–145)

## 2018-03-03 LAB — GLUCOSE, CAPILLARY
GLUCOSE-CAPILLARY: 477 mg/dL — AB (ref 70–99)
Glucose-Capillary: 461 mg/dL — ABNORMAL HIGH (ref 70–99)
Glucose-Capillary: 259 mg/dL — ABNORMAL HIGH (ref 70–99)
Glucose-Capillary: 320 mg/dL — ABNORMAL HIGH (ref 70–99)
Glucose-Capillary: 336 mg/dL — ABNORMAL HIGH (ref 70–99)

## 2018-03-03 MED ORDER — INSULIN ASPART 100 UNIT/ML ~~LOC~~ SOLN
0.0000 [IU] | Freq: Three times a day (TID) | SUBCUTANEOUS | Status: DC
  Administered 2018-03-04: 11 [IU] via SUBCUTANEOUS
  Administered 2018-03-04 (×2): 20 [IU] via SUBCUTANEOUS
  Administered 2018-03-05: 08:00:00 7 [IU] via SUBCUTANEOUS
  Filled 2018-03-03 (×4): qty 1

## 2018-03-03 MED ORDER — INSULIN REGULAR HUMAN 100 UNIT/ML IJ SOLN
10.0000 [IU] | Freq: Once | INTRAMUSCULAR | Status: AC
  Administered 2018-03-03: 10 [IU] via INTRAVENOUS
  Filled 2018-03-03: qty 10

## 2018-03-03 MED ORDER — ENSURE MAX PROTEIN PO LIQD
11.0000 [oz_av] | Freq: Two times a day (BID) | ORAL | Status: DC
  Administered 2018-03-03 – 2018-03-05 (×3): 11 [oz_av] via ORAL
  Filled 2018-03-03: qty 330

## 2018-03-03 NOTE — Progress Notes (Signed)
Patient transferred to room 123. Report given to [facility] on 1C. Felicia NT in room to check patient into room 123. Belongings sent with patient. Patient aware of transfer and notified mother on the phone about his transfer.

## 2018-03-03 NOTE — Progress Notes (Signed)
CRITICAL CARE NOTE  CC    Septic shock secondary to bacteremia from Haemophilus infection  SUBJECTIVE Patient clinically improved off pressors plan for DC to the medical floor today     SIGNIFICANT EVENTS -Central line removed, mentation improved, blood pressure stabilized off vasopressor support.   Vitals:   03/03/18 1000 03/03/18 1200  BP: 113/86 (!) 141/85  Pulse:  98  Resp: (!) 23 (!) 22  Temp:  98.9 F (37.2 C)  SpO2:  90%     REVIEW OF SYSTEMS  10 system ROS conducted and is negative except as per subjective findings.  PHYSICAL EXAMINATION:  GENERAL: No  apparent distress HEAD: Normocephalic, atraumatic.  EYES: Pupils equal, round, reactive to light.  No scleral icterus.  MOUTH: Moist mucosal membrane. NECK: Supple. No thyromegaly. No nodules. No JVD.  PULMONARY: +rhonchi at bases CARDIOVASCULAR: S1 and S2. Regular rate and rhythm. No murmurs, rubs, or gallops.  GASTROINTESTINAL: Soft, nontender, -distended. No masses. Positive bowel sounds. No hepatosplenomegaly.  MUSCULOSKELETAL: No swelling, clubbing, or edema.  NEUROLOGIC: Mild distress due to acute illness SKIN:intact,warm,dry   Labs and Imaging:     -I personally reviewed most recent blood work, imaging and microbiology - significant findings today are hyponatremia, hyperkalemia, AKI  LAB RESULTS: Recent Labs  Lab 03/01/18 0343  03/01/18 2156 03/02/18 0442 03/03/18 0528  NA 128*  --   --  132* 133*  K 5.9*   < > 5.4* 5.1 5.0  CL 89*  --   --  93* 98  CO2 27  --   --  27 24  BUN 117*  --   --  128* 130*  CREATININE 7.44*  --   --  6.33* 4.41*  GLUCOSE 294*  --   --  260* 258*   < > = values in this interval not displayed.   Recent Labs  Lab 02/28/18 0909 03/01/18 0343 03/02/18 0442  HGB 14.8 14.1 13.4  HCT 45.7 45.0 41.8  WBC 23.3* 20.5* 12.2*  PLT 152 144* 147*     IMAGING RESULTS: No results found.   ASSESSMENT AND PLAN SYNOPSIS  -Multidisciplinary rounds held  today  Septic shock-off vasopressors now, shock resolved  -Optimized for medical floor transfer -Due to bilateral lower lobe pneumoniaand hemophilus bacteremia -Decreasing IV fluid -Discussed case with family at bedside -Procalcitonin->20 -Leukocytosis at 23,000 improved to 12.2 overnight   Heart failure with reduced EF -Conservative fluid strategy Hold Lasix while patient is septick -Transthoracic-reasonable systolic function  Acute renal failure - KDIGO stage 4  - appreciate renal recs - for HD , need access  - patient declined    Mild persistent asthma -Albuterol nebs every 4 hours while awake  Morbid obesity -Likely complicating care due to severe OSA symptoms as well as RV strain  GI DVT prophylaxis -Protonix, SCDs   Critical care provider statement:    Critical care time (minutes):  35   Critical care time was exclusive of:  Separately billable procedures and  treating other patients   Critical care was necessary to treat or prevent imminent or  life-threatening deterioration of the following conditions:   Altered mental status, bacteremia due to Haemophilus infection with sepsis, acute renal failure stage IV.   Critical care was time spent personally by me on the following  activities:  Development of treatment plan with patient or surrogate,  discussions with consultants, evaluation of patient's response to  treatment, examination of patient, obtaining history from patient or  surrogate, ordering and performing  treatments and interventions, ordering  and review of laboratory studies and re-evaluation of patient's condition   I assumed direction of critical care for this patient from another  provider in my specialty: no     Ottie Glazier, M.D.  [facility] [place]

## 2018-03-03 NOTE — Progress Notes (Signed)
Patient ID: [name], male   DOB: 1965/02/10, 53 y.o.   MRN: [id number]  [employer] PROGRESS NOTE  [name] NWG:[id number] DOB: 10-Mar-1965 DOA: 02/28/2018 PCP: [name], DO  HPI/Subjective:  Patient sitting at the side of the bed and eating.  Wants to go home.  Afebrile. Not on oxygen.  Off pressors.   Objective: Vitals:   03/03/18 1200 03/03/18 1345  BP: (!) 141/85 130/85  Pulse: 98 (!) 101  Resp: (!) 22 20  Temp: 98.9 F (37.2 C) 98.6 F (37 C)  SpO2: 90% 90%    Filed Weights   03/01/18 0330 03/02/18 0448 03/03/18 0236  Weight: (!) 174.5 kg (!) 172 kg (!) 178.4 kg    ROS: Review of Systems  Constitutional: Negative for chills and fever.  Eyes: Negative for blurred vision.  Respiratory: Positive for cough and shortness of breath.   Cardiovascular: Negative for chest pain.  Gastrointestinal: Negative for abdominal pain, constipation, diarrhea, nausea and vomiting.  Genitourinary: Negative for dysuria.  Musculoskeletal: Negative for joint pain.  Neurological: Negative for dizziness and headaches.   Exam: Physical Exam  Constitutional: He is oriented to person, place, and time.  HENT:  Nose: Mucosal edema present.  Mouth/Throat: No oropharyngeal exudate or posterior oropharyngeal edema.  Eyes: Pupils are equal, round, and reactive to light. Conjunctivae, EOM and lids are normal.  Neck: No JVD present. Carotid bruit is not present. No edema present. No thyroid mass and no thyromegaly present.  Cardiovascular: S1 normal and S2 normal. Exam reveals no gallop.  No murmur heard. Respiratory: No respiratory distress. He has decreased breath sounds in the right lower field and the left lower field. He has no wheezes. He has rhonchi in the right lower field and the left lower field. He has no rales.  GI: Soft. Bowel sounds are normal. There is no abdominal tenderness.  Musculoskeletal:     Right ankle: He exhibits swelling.     Left  ankle: He exhibits swelling.  Lymphadenopathy:    He has no cervical adenopathy.  Neurological: He is alert and oriented to person, place, and time. No cranial nerve deficit.  Skin: Skin is warm. No rash noted. Nails show no clubbing.  Psychiatric: He has a normal mood and affect.      Data Reviewed: Basic Metabolic Panel: Recent Labs  Lab 02/28/18 0909 03/01/18 0343 03/01/18 1242 03/01/18 2156 03/02/18 0442 03/03/18 0528  NA 128* 128*  --   --  132* 133*  K 5.3* 5.9* 5.7* 5.4* 5.1 5.0  CL 84* 89*  --   --  93* 98  CO2 30 27  --   --  27 24  GLUCOSE 188* 294*  --   --  260* 258*  BUN 115* 117*  --   --  128* 130*  CREATININE 7.47* 7.44*  --   --  6.33* 4.41*  CALCIUM 8.2* 7.5*  --   --  7.9* 8.0*   Liver Function Tests: Recent Labs  Lab 02/28/18 0909  AST 20  ALT 17  ALKPHOS 92  BILITOT 1.3*  PROT 7.1  ALBUMIN 2.5*   CBC: Recent Labs  Lab 02/28/18 0909 03/01/18 0343 03/02/18 0442  WBC 23.3* 20.5* 12.2*  NEUTROABS 21.0*  --   --   HGB 14.8 14.1 13.4  HCT 45.7 45.0 41.8  MCV 93.6 96.2 94.4  PLT 152 144* 147*   Cardiac Enzymes: Recent Labs  Lab 02/28/18 0909  TROPONINI <0.03  CBG: Recent Labs  Lab 03/02/18 1605 03/02/18 1832 03/02/18 2133 03/03/18 0744 03/03/18 1137  GLUCAP 461* 448* 316* 259* 320*    Recent Results (from the past 240 hour(s))  Blood culture (routine x 2)     Status: Abnormal (Preliminary result)   Collection Time: 02/28/18 10:11 AM  Result Value Ref Range Status   Specimen Description   Final    BLOOD RIGHT ANTECUBITAL Performed at [facility], [address], [place], [place] [address]    Special Requests   Final    BOTTLES DRAWN AEROBIC AND ANAEROBIC Blood Culture adequate volume Performed at [facility], [address] [place], [place] [address]    Culture  Setup Time   Final    IN BOTH AEROBIC AND ANAEROBIC BOTTLES GRAM NEGATIVE COCCOBACILLI CRITICAL RESULT CALLED TO, READ BACK BY AND  VERIFIED WITH: KAREN HAYES AT [place] ON 03/01/2018 JJB    Culture (A)  Final    HAEMOPHILUS INFLUENZAE BETA LACTAMASE NEGATIVE HEALTH DEPARTMENT NOTIFIED Referred to [facility] in [place], [place] for serotyping. Performed at [facility], [place] [address], [place], [place] [address]    Report Status PENDING  Incomplete  Blood culture (routine x 2)     Status: None (Preliminary result)   Collection Time: 02/28/18 10:11 AM  Result Value Ref Range Status   Specimen Description BLOOD BLOOD RIGHT HAND  Final   Special Requests   Final    BOTTLES DRAWN AEROBIC AND ANAEROBIC Blood Culture results may not be optimal due to an inadequate volume of blood received in culture bottles   Culture   Final    NO GROWTH 3 DAYS Performed at [facility], [address] [place], [place] [address]    Report Status PENDING  Incomplete  Blood Culture ID Panel (Reflexed)     Status: Abnormal   Collection Time: 02/28/18 10:11 AM  Result Value Ref Range Status   Enterococcus species NOT DETECTED NOT DETECTED Final   Listeria monocytogenes NOT DETECTED NOT DETECTED Final   Staphylococcus species NOT DETECTED NOT DETECTED Final   Staphylococcus aureus (BCID) NOT DETECTED NOT DETECTED Final   Streptococcus species NOT DETECTED NOT DETECTED Final   Streptococcus agalactiae NOT DETECTED NOT DETECTED Final   Streptococcus pneumoniae NOT DETECTED NOT DETECTED Final   Streptococcus pyogenes NOT DETECTED NOT DETECTED Final   Acinetobacter baumannii NOT DETECTED NOT DETECTED Final   Enterobacteriaceae species NOT DETECTED NOT DETECTED Final   Enterobacter cloacae complex NOT DETECTED NOT DETECTED Final   Escherichia coli NOT DETECTED NOT DETECTED Final   Klebsiella oxytoca NOT DETECTED NOT DETECTED Final   Klebsiella pneumoniae NOT DETECTED NOT DETECTED Final   Proteus species NOT DETECTED NOT DETECTED Final   Serratia marcescens NOT DETECTED NOT DETECTED Final   Haemophilus influenzae DETECTED  (A) NOT DETECTED Final    Comment: CRITICAL RESULT CALLED TO, READ BACK BY AND VERIFIED WITH: KAREN HAYES AT 0826 ON 03/01/2018 JJB    Neisseria meningitidis NOT DETECTED NOT DETECTED Final   Pseudomonas aeruginosa NOT DETECTED NOT DETECTED Final   Candida albicans NOT DETECTED NOT DETECTED Final   Candida glabrata NOT DETECTED NOT DETECTED Final   Candida krusei NOT DETECTED NOT DETECTED Final   Candida parapsilosis NOT DETECTED NOT DETECTED Final   Candida tropicalis NOT DETECTED NOT DETECTED Final    Comment: Performed at [facility], [address], [place], [place] [address]  MRSA PCR Screening     Status: None  Collection Time: 02/28/18 12:31 PM  Result Value Ref Range Status   MRSA by PCR NEGATIVE NEGATIVE Final    Comment:        The GeneXpert MRSA Assay (FDA approved for NASAL specimens only), is one component of a comprehensive MRSA colonization surveillance program. It is not intended to diagnose MRSA infection nor to guide or monitor treatment for MRSA infections. Performed at [facility], [address] [place], [place] [address]   Culture, blood (single) w Reflex to ID Panel     Status: None (Preliminary result)   Collection Time: 02/28/18  6:05 PM  Result Value Ref Range Status   Specimen Description BLOOD BLOOD LEFT HAND  Final   Special Requests   Final    BOTTLES DRAWN AEROBIC ONLY Blood Culture results may not be optimal due to an inadequate volume of blood received in culture bottles   Culture   Final    NO GROWTH 3 DAYS Performed at [facility], [address] [place], [place] [address]    Report Status PENDING  Incomplete  Respiratory Panel by PCR     Status: None   Collection Time: 02/28/18  8:56 PM  Result Value Ref Range Status   Adenovirus NOT DETECTED NOT DETECTED Final   Coronavirus 229E NOT DETECTED NOT DETECTED Final    Comment: (NOTE) The Coronavirus on the Respiratory Panel, DOES NOT test for the novel   Coronavirus (2019 nCoV)    Coronavirus HKU1 NOT DETECTED NOT DETECTED Final   Coronavirus NL63 NOT DETECTED NOT DETECTED Final   Coronavirus OC43 NOT DETECTED NOT DETECTED Final   Metapneumovirus NOT DETECTED NOT DETECTED Final   Rhinovirus / Enterovirus NOT DETECTED NOT DETECTED Final   Influenza A NOT DETECTED NOT DETECTED Final   Influenza B NOT DETECTED NOT DETECTED Final   Parainfluenza Virus 1 NOT DETECTED NOT DETECTED Final   Parainfluenza Virus 2 NOT DETECTED NOT DETECTED Final   Parainfluenza Virus 3 NOT DETECTED NOT DETECTED Final   Parainfluenza Virus 4 NOT DETECTED NOT DETECTED Final   Respiratory Syncytial Virus NOT DETECTED NOT DETECTED Final   Bordetella pertussis NOT DETECTED NOT DETECTED Final   Chlamydophila pneumoniae NOT DETECTED NOT DETECTED Final   Mycoplasma pneumoniae NOT DETECTED NOT DETECTED Final    Comment: Performed at [facility], [place] [address], [place], [place] [address]     Studies: Dg Chest Port 1 View  Result Date: 03/02/2018 CLINICAL DATA:  Follow-up infiltrates EXAM: PORTABLE CHEST 1 VIEW COMPARISON:  Multiple films the most recent of which was 03/01/2018 FINDINGS: Cardiac shadow is enlarged but stable. Previously seen right basilar infiltrative density has improved slightly in the interval from the prior exam. Persistent left basilar density is noted. Mild vascular congestion is again noted. Right-sided PICC line is again seen extending to the right subclavian vein stable in appearance. No bony abnormality is seen. IMPRESSION: Vascular congestion slightly increased when compared with the prior exam. Slight improvement in the degree of right basilar infiltrate. Persistent left basilar infiltrate is noted. PICC line as described above. Electronically Signed   By: Inez Catalina M.D.   On: 03/02/2018 12:08    Scheduled Meds: . heparin  5,000 Units Subcutaneous Q8H  . insulin aspart  0-20 Units Subcutaneous TID WC  . insulin aspart  0-5 Units  Subcutaneous QHS  . insulin glargine  18 Units Subcutaneous QHS  . ipratropium-albuterol  3 mL Nebulization Q6H  . methylPREDNISolone (SOLU-MEDROL) injection  60 mg  Intravenous Q24H  . ENSURE MAX PROTEIN  11 oz Oral BID BM  . sodium chloride flush  10-40 mL Intracatheter Q12H  . sodium polystyrene  45 g Oral Once   Continuous Infusions: . cefTRIAXone (ROCEPHIN)  IV 2 g (03/03/18 1004)    Assessment/Plan:  1. Septic shock with multiorgan failure secondary to Haemophilus influenza pneumonia/ bacteremia.  Patient on Rocephin.  Off pressors and is being transferred to medical floor. 2. Acute kidney injury on chronic kidney disease stage III with hyperkalemia.  Patient refused dialysis.  Monitor kidney function on a daily basis.  Hyperkalemia has improved.  Slowly improving.  Monitor input and output.  Nephrology on board. 3. Type 2 diabetes mellitus.  Sugars will be high while on steroids.  On Lantus and sliding scale insulin Morbid obesity with a BMI of 53.66.  Weight loss needed  Patient wants to go home.  Lengthy discussion.  I was able to convince him to stay longer in the hospital.  He might leave AMA.  Code Status:     Code Status Orders  (From admission, onward)         Start     Ordered   02/28/18 1059  Full code  Continuous     02/28/18 1059        Code Status History    This patient has a current code status but no historical code status.     Disposition Plan: To be determined  Consultants:  Critical care specialist  Nephrology  Antibiotics:  Rocephin  Time spent: 40 minutes  [place]

## 2018-03-03 NOTE — Progress Notes (Signed)
[facility]  ROUNDING NOTE   Subjective:  Renal function has improved a bit. Creatinine down to 4.4. Urine output was 1.8 L over the preceding 24 hours.  Objective:  Vital signs in last 24 hours:  Temp:  [97.8 F (36.6 C)-98.4 F (36.9 C)] 98.4 F (36.9 C) (02/06 0800) Pulse Rate:  [94-101] 101 (02/06 0700) Resp:  [19-34] 23 (02/06 1000) BP: (112-151)/(78-98) 113/86 (02/06 1000) SpO2:  [90 %-99 %] 90 % (02/06 0800) Weight:  [178.4 kg] 178.4 kg (02/06 0236)  Weight change: 6.44 kg Filed Weights   03/01/18 0330 03/02/18 0448 03/03/18 0236  Weight: (!) 174.5 kg (!) 172 kg (!) 178.4 kg    Intake/Output: I/O last 3 completed shifts: In: 4481.4 [P.O.:960; I.V.:3521.4] Out: 2550 [Urine:2550]   Intake/Output this shift:  Total I/O In: 101.5 [I.V.:101.5] Out: 250 [Urine:250]  Physical Exam: General: Sitting up in chair  Head: Normocephalic, atraumatic. Moist oral mucosal membranes  Eyes: Anicteric  Neck: Supple, trachea midline  Lungs:  Scattered rhonchi, normal effort  Heart: S1S2 no rubs  Abdomen:  Soft, nontender, bowel sounds present  Extremities: 2+ peripheral edema.  Neurologic: Awake, alert, following commands  Skin: No lesions       Basic Metabolic Panel: Recent Labs  Lab 02/28/18 0909 03/01/18 0343 03/01/18 1242 03/01/18 2156 03/02/18 0442 03/03/18 0528  NA 128* 128*  --   --  132* 133*  K 5.3* 5.9* 5.7* 5.4* 5.1 5.0  CL 84* 89*  --   --  93* 98  CO2 30 27  --   --  27 24  GLUCOSE 188* 294*  --   --  260* 258*  BUN 115* 117*  --   --  128* 130*  CREATININE 7.47* 7.44*  --   --  6.33* 4.41*  CALCIUM 8.2* 7.5*  --   --  7.9* 8.0*    Liver Function Tests: Recent Labs  Lab 02/28/18 0909  AST 20  ALT 17  ALKPHOS 92  BILITOT 1.3*  PROT 7.1  ALBUMIN 2.5*   No results for input(s): LIPASE, AMYLASE in the last 168 hours. No results for input(s): AMMONIA in the last 168 hours.  CBC: Recent Labs  Lab 02/28/18 0909 03/01/18 0343  03/02/18 0442  WBC 23.3* 20.5* 12.2*  NEUTROABS 21.0*  --   --   HGB 14.8 14.1 13.4  HCT 45.7 45.0 41.8  MCV 93.6 96.2 94.4  PLT 152 144* 147*    Cardiac Enzymes: Recent Labs  Lab 02/28/18 0909  TROPONINI <0.03    BNP: Invalid input(s): POCBNP  CBG: Recent Labs  Lab 03/02/18 1152 03/02/18 1605 03/02/18 1832 03/02/18 2133 03/03/18 0744  GLUCAP 315* 461* 448* 316* 43*    Microbiology: Results for orders placed or performed during the hospital encounter of 02/28/18  Blood culture (routine x 2)     Status: Abnormal (Preliminary result)   Collection Time: 02/28/18 10:11 AM  Result Value Ref Range Status   Specimen Description   Final    BLOOD RIGHT ANTECUBITAL Performed at [facility], [address], [place], [place] [address]    Special Requests   Final    BOTTLES DRAWN AEROBIC AND ANAEROBIC Blood Culture adequate volume Performed at [facility], [address] [place], [place] [address]    Culture  Setup Time   Final    IN BOTH AEROBIC AND ANAEROBIC BOTTLES GRAM NEGATIVE COCCOBACILLI CRITICAL RESULT CALLED TO, READ BACK BY AND VERIFIED WITH: KAREN HAYES  AT 0826 ON 03/01/2018 JJB    Culture (A)  Final    HAEMOPHILUS INFLUENZAE BETA LACTAMASE NEGATIVE HEALTH DEPARTMENT NOTIFIED Referred to [facility] in [place], [place] for serotyping. Performed at [facility], [place] [address] [place], [place] [address]    Report Status PENDING  Incomplete  Blood culture (routine x 2)     Status: None (Preliminary result)   Collection Time: 02/28/18 10:11 AM  Result Value Ref Range Status   Specimen Description BLOOD BLOOD RIGHT HAND  Final   Special Requests   Final    BOTTLES DRAWN AEROBIC AND ANAEROBIC Blood Culture results may not be optimal due to an inadequate volume of blood received in culture bottles   Culture   Final    NO GROWTH 3 DAYS Performed at [facility], [address] [place], [place] [address]    Report  Status PENDING  Incomplete  Blood Culture ID Panel (Reflexed)     Status: Abnormal   Collection Time: 02/28/18 10:11 AM  Result Value Ref Range Status   Enterococcus species NOT DETECTED NOT DETECTED Final   Listeria monocytogenes NOT DETECTED NOT DETECTED Final   Staphylococcus species NOT DETECTED NOT DETECTED Final   Staphylococcus aureus (BCID) NOT DETECTED NOT DETECTED Final   Streptococcus species NOT DETECTED NOT DETECTED Final   Streptococcus agalactiae NOT DETECTED NOT DETECTED Final   Streptococcus pneumoniae NOT DETECTED NOT DETECTED Final   Streptococcus pyogenes NOT DETECTED NOT DETECTED Final   Acinetobacter baumannii NOT DETECTED NOT DETECTED Final   Enterobacteriaceae species NOT DETECTED NOT DETECTED Final   Enterobacter cloacae complex NOT DETECTED NOT DETECTED Final   Escherichia coli NOT DETECTED NOT DETECTED Final   Klebsiella oxytoca NOT DETECTED NOT DETECTED Final   Klebsiella pneumoniae NOT DETECTED NOT DETECTED Final   Proteus species NOT DETECTED NOT DETECTED Final   Serratia marcescens NOT DETECTED NOT DETECTED Final   Haemophilus influenzae DETECTED (A) NOT DETECTED Final    Comment: CRITICAL RESULT CALLED TO, READ BACK BY AND VERIFIED WITH: KAREN HAYES AT 0826 ON 03/01/2018 JJB    Neisseria meningitidis NOT DETECTED NOT DETECTED Final   Pseudomonas aeruginosa NOT DETECTED NOT DETECTED Final   Candida albicans NOT DETECTED NOT DETECTED Final   Candida glabrata NOT DETECTED NOT DETECTED Final   Candida krusei NOT DETECTED NOT DETECTED Final   Candida parapsilosis NOT DETECTED NOT DETECTED Final   Candida tropicalis NOT DETECTED NOT DETECTED Final    Comment: Performed at [facility], [address] [place], [place] [address]  MRSA PCR Screening     Status: None   Collection Time: 02/28/18 12:31 PM  Result Value Ref Range Status   MRSA by PCR NEGATIVE NEGATIVE Final    Comment:        The GeneXpert MRSA Assay (FDA approved for NASAL  specimens only), is one component of a comprehensive MRSA colonization surveillance program. It is not intended to diagnose MRSA infection nor to guide or monitor treatment for MRSA infections. Performed at [facility], [address] [place], [place] [address]   Culture, blood (single) w Reflex to ID Panel     Status: None (Preliminary result)   Collection Time: 02/28/18  6:05 PM  Result Value Ref Range Status   Specimen Description BLOOD BLOOD LEFT HAND  Final   Special Requests   Final    BOTTLES DRAWN AEROBIC ONLY Blood Culture results may not be optimal due to an inadequate volume of  blood received in culture bottles   Culture   Final    NO GROWTH 3 DAYS Performed at [facility], [address] [place], [place] [address]    Report Status PENDING  Incomplete  Respiratory Panel by PCR     Status: None   Collection Time: 02/28/18  8:56 PM  Result Value Ref Range Status   Adenovirus NOT DETECTED NOT DETECTED Final   Coronavirus 229E NOT DETECTED NOT DETECTED Final    Comment: (NOTE) The Coronavirus on the Respiratory Panel, DOES NOT test for the novel  Coronavirus (2019 nCoV)    Coronavirus HKU1 NOT DETECTED NOT DETECTED Final   Coronavirus NL63 NOT DETECTED NOT DETECTED Final   Coronavirus OC43 NOT DETECTED NOT DETECTED Final   Metapneumovirus NOT DETECTED NOT DETECTED Final   Rhinovirus / Enterovirus NOT DETECTED NOT DETECTED Final   Influenza A NOT DETECTED NOT DETECTED Final   Influenza B NOT DETECTED NOT DETECTED Final   Parainfluenza Virus 1 NOT DETECTED NOT DETECTED Final   Parainfluenza Virus 2 NOT DETECTED NOT DETECTED Final   Parainfluenza Virus 3 NOT DETECTED NOT DETECTED Final   Parainfluenza Virus 4 NOT DETECTED NOT DETECTED Final   Respiratory Syncytial Virus NOT DETECTED NOT DETECTED Final   Bordetella pertussis NOT DETECTED NOT DETECTED Final   Chlamydophila pneumoniae NOT DETECTED NOT DETECTED Final   Mycoplasma pneumoniae NOT  DETECTED NOT DETECTED Final    Comment: Performed at [facility], [place]. [address] [place], [place] [address]    Coagulation Studies: Recent Labs    02/28/18 1330  LABPROT 15.1  INR 1.20    Urinalysis: Recent Labs    02/28/18 1910  COLORURINE AMBER*  LABSPEC 1.018  PHURINE 5.0  GLUCOSEU NEGATIVE  HGBUR NEGATIVE  BILIRUBINUR NEGATIVE  KETONESUR NEGATIVE  PROTEINUR NEGATIVE  NITRITE NEGATIVE  LEUKOCYTESUR NEGATIVE      Imaging: Dg Chest Port 1 View  Result Date: 03/02/2018 CLINICAL DATA:  Follow-up infiltrates EXAM: PORTABLE CHEST 1 VIEW COMPARISON:  Multiple films the most recent of which was 03/01/2018 FINDINGS: Cardiac shadow is enlarged but stable. Previously seen right basilar infiltrative density has improved slightly in the interval from the prior exam. Persistent left basilar density is noted. Mild vascular congestion is again noted. Right-sided PICC line is again seen extending to the right subclavian vein stable in appearance. No bony abnormality is seen. IMPRESSION: Vascular congestion slightly increased when compared with the prior exam. Slight improvement in the degree of right basilar infiltrate. Persistent left basilar infiltrate is noted. PICC line as described above. Electronically Signed   By: Inez Catalina M.D.   On: 03/02/2018 12:08     Medications:   . cefTRIAXone (ROCEPHIN)  IV 2 g (03/03/18 1004)   . heparin  5,000 Units Subcutaneous Q8H  . insulin aspart  0-20 Units Subcutaneous TID WC  . insulin aspart  0-5 Units Subcutaneous QHS  . insulin glargine  18 Units Subcutaneous QHS  . ipratropium-albuterol  3 mL Nebulization Q6H  . methylPREDNISolone (SOLU-MEDROL) injection  60 mg Intravenous Q24H  . ENSURE MAX PROTEIN  11 oz Oral BID BM  . sodium chloride flush  10-40 mL Intracatheter Q12H  . sodium polystyrene  45 g Oral Once   acetaminophen **OR** acetaminophen, albuterol, ondansetron **OR** ondansetron (ZOFRAN) IV, polyethylene glycol, sodium  chloride flush  Assessment/ Plan:  53 y.o. male with a PMHx of diabetes mellitus type 2, morbid obesity, hypertension, gout, asthma, seasonal allergies, chronic kidney disease stage III  baseline EGFR 41, who was admitted to [facility] on 02/28/2018 for evaluation of hypotension.   1.  Acute renal failure/chronic kidney disease stage III/diabetes mellitus type 2 with chronic kidney disease.  Acute renal failure secondary to prolonged dehydration and hypotension as well as ibuprofen use. -Renal function has actually improved.  Creatinine down to 4.4 with urine output of 1.8 L over the preceding 24 hours.  Thus far has made 800 cc of urine today as well.  Therefore no urgent indication for dialysis at the moment.  Hyperkalemia also improved with a serum potassium of 5.0.  Avoid nephrotoxins and IV contrast in the recovery period.   2.  Hyperkalemia.    Potassium currently down to 5.0.  Continue to periodically monitor.  3.  Hypotension.  He is off of pressors at the moment.  Continue to monitor blood pressure trend..     LOS: 3 [name] 2/6/202011:35 AM

## 2018-03-04 LAB — BASIC METABOLIC PANEL
Anion gap: 9 (ref 5–15)
BUN: 120 mg/dL — AB (ref 6–20)
CO2: 25 mmol/L (ref 22–32)
CREATININE: 3.07 mg/dL — AB (ref 0.61–1.24)
Calcium: 8.5 mg/dL — ABNORMAL LOW (ref 8.9–10.3)
Chloride: 100 mmol/L (ref 98–111)
GFR calc Af Amer: 26 mL/min — ABNORMAL LOW (ref >60)
GFR calc non Af Amer: 22 mL/min — ABNORMAL LOW (ref >60)
Glucose, Bld: 414 mg/dL — ABNORMAL HIGH (ref 70–99)
Potassium: 5.6 mmol/L — ABNORMAL HIGH (ref 3.5–5.1)
Sodium: 134 mmol/L — ABNORMAL LOW (ref 135–145)

## 2018-03-04 LAB — GLUCOSE, CAPILLARY
GLUCOSE-CAPILLARY: 413 mg/dL — AB (ref 70–99)
Glucose-Capillary: 352 mg/dL — ABNORMAL HIGH (ref 70–99)
Glucose-Capillary: 399 mg/dL — ABNORMAL HIGH (ref 70–99)
Glucose-Capillary: 374 mg/dL — ABNORMAL HIGH (ref 70–99)
Glucose-Capillary: 259 mg/dL — ABNORMAL HIGH (ref 70–99)
Glucose-Capillary: 129 mg/dL — ABNORMAL HIGH (ref 70–99)

## 2018-03-04 MED ORDER — SODIUM CHLORIDE 0.9 % IV SOLN
INTRAVENOUS | Status: DC | PRN
  Administered 2018-03-04 – 2018-03-05 (×2): 250 mL via INTRAVENOUS

## 2018-03-04 MED ORDER — SODIUM POLYSTYRENE SULFONATE 15 GM/60ML PO SUSP
30.0000 g | Freq: Once | ORAL | Status: AC
  Administered 2018-03-04: 09:00:00 30 g via ORAL
  Filled 2018-03-04: qty 120

## 2018-03-04 MED ORDER — FUROSEMIDE 10 MG/ML IJ SOLN
20.0000 mg | Freq: Once | INTRAMUSCULAR | Status: AC
  Administered 2018-03-04: 20 mg via INTRAVENOUS
  Filled 2018-03-04: qty 2

## 2018-03-04 MED ORDER — INSULIN ASPART 100 UNIT/ML ~~LOC~~ SOLN
4.0000 [IU] | Freq: Three times a day (TID) | SUBCUTANEOUS | Status: DC
  Administered 2018-03-04 – 2018-03-05 (×4): 4 [IU] via SUBCUTANEOUS
  Filled 2018-03-04 (×4): qty 1

## 2018-03-04 MED ORDER — SODIUM ZIRCONIUM CYCLOSILICATE 10 G PO PACK
10.0000 g | PACK | Freq: Two times a day (BID) | ORAL | Status: DC
  Administered 2018-03-04 – 2018-03-05 (×2): 10 g via ORAL
  Filled 2018-03-04 (×5): qty 1

## 2018-03-04 MED ORDER — INSULIN REGULAR HUMAN 100 UNIT/ML IJ SOLN
20.0000 [IU] | Freq: Once | INTRAMUSCULAR | Status: AC
  Administered 2018-03-04: 02:00:00 20 [IU] via INTRAVENOUS
  Filled 2018-03-04: qty 10

## 2018-03-04 MED ORDER — INSULIN ASPART 100 UNIT/ML ~~LOC~~ SOLN: 4.0000 [IU] | Freq: Three times a day (TID) | SUBCUTANEOUS | Status: DC

## 2018-03-04 MED ORDER — INSULIN GLARGINE 100 UNIT/ML ~~LOC~~ SOLN: 30.0000 [IU] | Freq: Every day | SUBCUTANEOUS | Status: DC

## 2018-03-04 MED ORDER — LIVING WELL WITH DIABETES BOOK
Freq: Once | Status: AC
  Administered 2018-03-04: 10:00:00
  Filled 2018-03-04: qty 1

## 2018-03-04 MED ORDER — INSULIN GLARGINE 100 UNIT/ML ~~LOC~~ SOLN
24.0000 [IU] | Freq: Every day | SUBCUTANEOUS | Status: DC
  Filled 2018-03-04: qty 0.24

## 2018-03-04 NOTE — Progress Notes (Addendum)
Inpatient Diabetes Program Recommendations  AACE/ADA: New Consensus Statement on Inpatient Glycemic Control   Target Ranges:  Prepandial:   less than 140 mg/dL      Peak postprandial:   less than 180 mg/dL (1-2 hours)      Critically ill patients:  140 - 180 mg/dL   Results for [name], [name] (MRN [id number]) as of 03/04/2018 08:47  Ref. Range 03/03/2018 07:44 03/03/2018 11:37 03/03/2018 16:52 03/03/2018 20:41 03/03/2018 23:54 03/04/2018 05:24 03/04/2018 07:55  Glucose-Capillary Latest Ref Range: 70 - 99 mg/dL 259 (H)  Novolog 11 units 320 (H)  Novolog 15 units 336 (H)  Novolog 15 units 477 (H)  Lantus 18 units  Regular 10 units @ 22:19 413 (H)     Regular 20 units @ 1:41 am 352 (H) 399 (H)  Results for [name], [name] (MRN [id number]) as of 03/04/2018 08:47  Ref. Range 10/13/2017 08:02 02/28/2018 12:45  Hemoglobin A1C Latest Ref Range: 4.8 - 5.6 % 6.8 (H) 8.1 (H)   Review of Glycemic Control  Diabetes history: DM2 Outpatient Diabetes medications: Lantus 10 units daily Current orders for Inpatient glycemic control: Lantus 24 units QHS, Novolog 4 units TID with meals, Novolog 0-20 units AC&HS  Inpatient Diabetes Program Recommendations:  HgbA1C: A1C 8.1% on 02/28/18 inidating an average glucose of 186 mg/dl over the past 2-3 months.  NOTE: Noted patient was ordered steroids which have been discontinued; last dose of Solumedrol 60 mg was given at 10:05 am on 03/03/18. Glucose should improve since steroids are no longer ordered and Lantus was increased and meal coverage was added today. Will continue to follow.  Addendum 03/04/18@14 :00-Spoke with patient regarding DM control. Patient appeared to be disinterested in talking with Diabetes Coordinator as he continued to watch TV and provided short answers to questions.  Patient states that he takes Lantus 10 units daily for DM control. Patient reports that he was taking Lantus 20 units daily but he has been able to decrease it down to 10 units daily.  Patient reports that his glucose is usually fairly well controlled. Patient notes that his glucose is higher here at the hospital because he has been "eating a lot of snacks because there isn't anything else to do here."  Discussed impact of diet and medications on glycemic control. Discussed that patient was ordered Solumedrol which is a steroid and is contributing to hyperglycemia. In further talking with patient, he stated that "I want to be discharged home today. I changed my mind."  Explained that it was noted from MD note today that patient was agreeable to stay one more day. Patient stated again, that he wanted to go home and he wanted to talk to his nurse.  Informed patient I would let Stanton Kidney, RN know and ask that she come and talk with him.  Patient verbalized understanding of information discussed and he states that he has no questions at this time. Talked with Stanton Kidney, RN to inform her of patient's request to be discharged home today.  Thanks, Barnie Alderman, RN, MSN, CDE Diabetes Coordinator Inpatient Diabetes Program [phone] (Team Pager from 8am to 5pm)

## 2018-03-04 NOTE — Progress Notes (Signed)
Patient ID: [name], male   DOB: 06-Dec-1965, 53 y.o.   MRN: [id number]  [facility] PROGRESS NOTE  [name] LYY:[id number] DOB: 1965/05/04 DOA: 02/28/2018 PCP: Darren Hauser, DO  HPI/Subjective:  Patient sitting at the side of the bed  Afebrile.  Has dry cough. Documented urine output not accurate.  He tells me he has clear urine with good output. Wants to go home   Objective: Vitals:   03/04/18 0526 03/04/18 0805  BP: 119/90   Pulse: 99   Resp: 20   Temp: 97.6 F (36.4 C)   SpO2: 94% (!) 89%    Filed Weights   03/01/18 0330 03/02/18 0448 03/03/18 0236  Weight: (!) 174.5 kg (!) 172 kg (!) 178.4 kg    ROS: Review of Systems  Constitutional: Negative for chills and fever.  Eyes: Negative for blurred vision.  Respiratory: Positive for cough and shortness of breath.   Cardiovascular: Negative for chest pain.  Gastrointestinal: Negative for abdominal pain, constipation, diarrhea, nausea and vomiting.  Genitourinary: Negative for dysuria.  Musculoskeletal: Negative for joint pain.  Neurological: Negative for dizziness and headaches.   Exam: Physical Exam  Constitutional: He is oriented to person, place, and time.  HENT:  Nose: Mucosal edema present.  Mouth/Throat: No oropharyngeal exudate or posterior oropharyngeal edema.  Eyes: Pupils are equal, round, and reactive to light. Conjunctivae, EOM and lids are normal.  Neck: No JVD present. Carotid bruit is not present. No edema present. No thyroid mass and no thyromegaly present.  Cardiovascular: S1 normal and S2 normal. Exam reveals no gallop.  No murmur heard. Respiratory: No respiratory distress. He has decreased breath sounds in the right lower field and the left lower field. He has no wheezes. He has rhonchi in the right lower field and the left lower field. He has no rales.  GI: Soft. Bowel sounds are normal. There is no abdominal tenderness.  Musculoskeletal:     Right ankle: He  exhibits swelling.     Left ankle: He exhibits swelling.  Lymphadenopathy:    He has no cervical adenopathy.  Neurological: He is alert and oriented to person, place, and time. No cranial nerve deficit.  Skin: Skin is warm. No rash noted. Nails show no clubbing.  Psychiatric: He has a normal mood and affect.      Data Reviewed: Basic Metabolic Panel: Recent Labs  Lab 02/28/18 0909 03/01/18 0343 03/01/18 1242 03/01/18 2156 03/02/18 0442 03/03/18 0528 03/04/18 0558  NA 128* 128*  --   --  132* 133* 134*  K 5.3* 5.9* 5.7* 5.4* 5.1 5.0 5.6*  CL 84* 89*  --   --  93* 98 100  CO2 30 27  --   --  27 24 25   GLUCOSE 188* 294*  --   --  260* 258* 414*  BUN 115* 117*  --   --  128* 130* 120*  CREATININE 7.47* 7.44*  --   --  6.33* 4.41* 3.07*  CALCIUM 8.2* 7.5*  --   --  7.9* 8.0* 8.5*   Liver Function Tests: Recent Labs  Lab 02/28/18 0909  AST 20  ALT 17  ALKPHOS 92  BILITOT 1.3*  PROT 7.1  ALBUMIN 2.5*   CBC: Recent Labs  Lab 02/28/18 0909 03/01/18 0343 03/02/18 0442  WBC 23.3* 20.5* 12.2*  NEUTROABS 21.0*  --   --   HGB 14.8 14.1 13.4  HCT 45.7 45.0 41.8  MCV 93.6 96.2 94.4  PLT 152 144*  147*   Cardiac Enzymes: Recent Labs  Lab 02/28/18 0909  TROPONINI <0.03    CBG: Recent Labs  Lab 03/03/18 1652 03/03/18 2041 03/03/18 2354 03/04/18 0524 03/04/18 0755  GLUCAP 336* 477* 413* 352* 399*    Recent Results (from the past 240 hour(s))  Blood culture (routine x 2)     Status: Abnormal (Preliminary result)   Collection Time: 02/28/18 10:11 AM  Result Value Ref Range Status   Specimen Description   Final    BLOOD RIGHT ANTECUBITAL Performed at [facility], [address] [place], [place] [address]    Special Requests   Final    BOTTLES DRAWN AEROBIC AND ANAEROBIC Blood Culture adequate volume Performed at [facility], [address] [place], [place] [address]    Culture  Setup Time   Final    IN BOTH AEROBIC AND ANAEROBIC  BOTTLES GRAM NEGATIVE COCCOBACILLI CRITICAL RESULT CALLED TO, READ BACK BY AND VERIFIED WITH: KAREN HAYES AT [place] ON 03/01/2018 JJB    Culture (A)  Final    HAEMOPHILUS INFLUENZAE BETA LACTAMASE NEGATIVE HEALTH DEPARTMENT NOTIFIED Referred to [facility] in [place], [place] for serotyping. Performed at [facility], [address] [place], [place] [address]    Report Status PENDING  Incomplete  Blood culture (routine x 2)     Status: None (Preliminary result)   Collection Time: 02/28/18 10:11 AM  Result Value Ref Range Status   Specimen Description BLOOD BLOOD RIGHT HAND  Final   Special Requests   Final    BOTTLES DRAWN AEROBIC AND ANAEROBIC Blood Culture results may not be optimal due to an inadequate volume of blood received in culture bottles   Culture   Final    NO GROWTH 4 DAYS Performed at [facility], [address] [place], [place] [address]    Report Status PENDING  Incomplete  Blood Culture ID Panel (Reflexed)     Status: Abnormal   Collection Time: 02/28/18 10:11 AM  Result Value Ref Range Status   Enterococcus species NOT DETECTED NOT DETECTED Final   Listeria monocytogenes NOT DETECTED NOT DETECTED Final   Staphylococcus species NOT DETECTED NOT DETECTED Final   Staphylococcus aureus (BCID) NOT DETECTED NOT DETECTED Final   Streptococcus species NOT DETECTED NOT DETECTED Final   Streptococcus agalactiae NOT DETECTED NOT DETECTED Final   Streptococcus pneumoniae NOT DETECTED NOT DETECTED Final   Streptococcus pyogenes NOT DETECTED NOT DETECTED Final   Acinetobacter baumannii NOT DETECTED NOT DETECTED Final   Enterobacteriaceae species NOT DETECTED NOT DETECTED Final   Enterobacter cloacae complex NOT DETECTED NOT DETECTED Final   Escherichia coli NOT DETECTED NOT DETECTED Final   Klebsiella oxytoca NOT DETECTED NOT DETECTED Final   Klebsiella pneumoniae NOT DETECTED NOT DETECTED Final   Proteus species NOT DETECTED NOT DETECTED Final    Serratia marcescens NOT DETECTED NOT DETECTED Final   Haemophilus influenzae DETECTED (A) NOT DETECTED Final    Comment: CRITICAL RESULT CALLED TO, READ BACK BY AND VERIFIED WITH: KAREN HAYES AT 6237 ON 03/01/2018 JJB    Neisseria meningitidis NOT DETECTED NOT DETECTED Final   Pseudomonas aeruginosa NOT DETECTED NOT DETECTED Final   Candida albicans NOT DETECTED NOT DETECTED Final   Candida glabrata NOT DETECTED NOT DETECTED Final   Candida krusei NOT DETECTED NOT DETECTED Final   Candida parapsilosis NOT DETECTED NOT DETECTED Final   Candida tropicalis NOT DETECTED NOT DETECTED Final    Comment: Performed at [facility],  [place], [place] [address]  MRSA PCR Screening     Status: None   Collection Time: 02/28/18 12:31 PM  Result Value Ref Range Status   MRSA by PCR NEGATIVE NEGATIVE Final    Comment:        The GeneXpert MRSA Assay (FDA approved for NASAL specimens only), is one component of a comprehensive MRSA colonization surveillance program. It is not intended to diagnose MRSA infection nor to guide or monitor treatment for MRSA infections. Performed at [facility], [address] [place], [place] [address]   Culture, blood (single) w Reflex to ID Panel     Status: None (Preliminary result)   Collection Time: 02/28/18  6:05 PM  Result Value Ref Range Status   Specimen Description BLOOD BLOOD LEFT HAND  Final   Special Requests   Final    BOTTLES DRAWN AEROBIC ONLY Blood Culture results may not be optimal due to an inadequate volume of blood received in culture bottles   Culture   Final    NO GROWTH 4 DAYS Performed at [facility], [address] [place], [place] [address]    Report Status PENDING  Incomplete  Respiratory Panel by PCR     Status: None   Collection Time: 02/28/18  8:56 PM  Result Value Ref Range Status   Adenovirus NOT DETECTED NOT DETECTED Final   Coronavirus 229E NOT DETECTED NOT DETECTED Final     Comment: (NOTE) The Coronavirus on the Respiratory Panel, DOES NOT test for the novel  Coronavirus (2019 nCoV)    Coronavirus HKU1 NOT DETECTED NOT DETECTED Final   Coronavirus NL63 NOT DETECTED NOT DETECTED Final   Coronavirus OC43 NOT DETECTED NOT DETECTED Final   Metapneumovirus NOT DETECTED NOT DETECTED Final   Rhinovirus / Enterovirus NOT DETECTED NOT DETECTED Final   Influenza A NOT DETECTED NOT DETECTED Final   Influenza B NOT DETECTED NOT DETECTED Final   Parainfluenza Virus 1 NOT DETECTED NOT DETECTED Final   Parainfluenza Virus 2 NOT DETECTED NOT DETECTED Final   Parainfluenza Virus 3 NOT DETECTED NOT DETECTED Final   Parainfluenza Virus 4 NOT DETECTED NOT DETECTED Final   Respiratory Syncytial Virus NOT DETECTED NOT DETECTED Final   Bordetella pertussis NOT DETECTED NOT DETECTED Final   Chlamydophila pneumoniae NOT DETECTED NOT DETECTED Final   Mycoplasma pneumoniae NOT DETECTED NOT DETECTED Final    Comment: Performed at [facility], [place] [address], [place], [place] [address]     Studies: No results found.  Scheduled Meds: . heparin  5,000 Units Subcutaneous Q8H  . insulin aspart  0-20 Units Subcutaneous TID AC & HS  . insulin aspart  4 Units Subcutaneous TID WC  . insulin glargine  24 Units Subcutaneous QHS  . ipratropium-albuterol  3 mL Nebulization Q6H  . ENSURE MAX PROTEIN  11 oz Oral BID BM  . sodium chloride flush  10-40 mL Intracatheter Q12H   Continuous Infusions: . sodium chloride 250 mL (03/04/18 0931)  . cefTRIAXone (ROCEPHIN)  IV 2 g (03/04/18 0934)    Assessment/Plan:  1. Septic shock with multiorgan failure secondary to Haemophilus influenza pneumonia/ bacteremia.  Beta-lactamase negative. patient on Rocephin.  Off pressors now.  Septic shock resolved. Discussed with Dr. Tama High of infectious disease.  Omnicef or Augmentin would be good choices at discharge. 2. Acute kidney injury on chronic kidney disease stage III with hyperkalemia.    Hyperkalemia has resolved.  Monitor kidney function on a daily  basis.  Hyperkalemia has improved.  Slowly improving.  Monitor input and output.  Nephrology on board. 3. Type 2 diabetes mellitus.  Sugars will be high while on steroids.  On Lantus and sliding scale insulin.  Steroids stopped.  Lantus dose increased. Morbid obesity with a BMI of 53.66.  Weight loss needed  Discussed again with patient regarding continued inpatient stay with multiple organ failure and significantly elevated BUN and creatinine at this point.  He is willing to stay 1 more day.  Code Status:     Code Status Orders  (From admission, onward)         Start     Ordered   02/28/18 1059  Full code  Continuous     02/28/18 1059        Code Status History    This patient has a current code status but no historical code status.     Disposition Plan: Home in 2-3 days  Consultants:  Nephrology  Antibiotics:  Rocephin  Time spent: 40 minutes  [facility]

## 2018-03-04 NOTE — Progress Notes (Signed)
[facility]  ROUNDING NOTE   Subjective:  Urine output was 1.3 L over the preceding 24 hours. BUN currently 120 and creatinine down to 3.07. Patient's mother at bedside.  Objective:  Vital signs in last 24 hours:  Temp:  [97.6 F (36.4 C)-98.8 F (37.1 C)] 97.6 F (36.4 C) (02/07 0526) Pulse Rate:  [99-102] 99 (02/07 0526) Resp:  [19-20] 20 (02/07 0526) BP: (119-148)/(85-93) 119/90 (02/07 0526) SpO2:  [81 %-94 %] 89 % (02/07 0805)  Weight change:  Filed Weights   03/01/18 0330 03/02/18 0448 03/03/18 0236  Weight: (!) 174.5 kg (!) 172 kg (!) 178.4 kg    Intake/Output: I/O last 3 completed shifts: In: 1985.7 [P.O.:960; I.V.:1025.7] Out: 2250 [Urine:2250]   Intake/Output this shift:  No intake/output data recorded.  Physical Exam: General: Sitting up in cbed  Head: Normocephalic, atraumatic. Moist oral mucosal membranes  Eyes: Anicteric  Neck: Supple, trachea midline  Lungs:  Scattered rhonchi, normal effort  Heart: S1S2 no rubs  Abdomen:  Soft, nontender, bowel sounds present  Extremities: 2+ peripheral edema.  Neurologic: Awake, alert, following commands  Skin: No lesions       Basic Metabolic Panel: Recent Labs  Lab 02/28/18 0909 03/01/18 0343 03/01/18 1242 03/01/18 2156 03/02/18 0442 03/03/18 0528 03/04/18 0558  NA 128* 128*  --   --  132* 133* 134*  K 5.3* 5.9* 5.7* 5.4* 5.1 5.0 5.6*  CL 84* 89*  --   --  93* 98 100  CO2 30 27  --   --  '27 24 25  ' GLUCOSE 188* 294*  --   --  260* 258* 414*  BUN 115* 117*  --   --  128* 130* 120*  CREATININE 7.47* 7.44*  --   --  6.33* 4.41* 3.07*  CALCIUM 8.2* 7.5*  --   --  7.9* 8.0* 8.5*    Liver Function Tests: Recent Labs  Lab 02/28/18 0909  AST 20  ALT 17  ALKPHOS 92  BILITOT 1.3*  PROT 7.1  ALBUMIN 2.5*   No results for input(s): LIPASE, AMYLASE in the last 168 hours. No results for input(s): AMMONIA in the last 168 hours.  CBC: Recent Labs  Lab 02/28/18 0909 03/01/18 0343  03/02/18 0442  WBC 23.3* 20.5* 12.2*  NEUTROABS 21.0*  --   --   HGB 14.8 14.1 13.4  HCT 45.7 45.0 41.8  MCV 93.6 96.2 94.4  PLT 152 144* 147*    Cardiac Enzymes: Recent Labs  Lab 02/28/18 0909  TROPONINI <0.03    BNP: Invalid input(s): POCBNP  CBG: Recent Labs  Lab 03/03/18 2041 03/03/18 2354 03/04/18 0524 03/04/18 0755 03/04/18 1201  GLUCAP 477* 413* 352* 399* 374*    Microbiology: Results for orders placed or performed during the hospital encounter of 02/28/18  Blood culture (routine x 2)     Status: Abnormal (Preliminary result)   Collection Time: 02/28/18 10:11 AM  Result Value Ref Range Status   Specimen Description   Final    BLOOD RIGHT ANTECUBITAL Performed at [facility], [address], [place], [place] [address]    Special Requests   Final    BOTTLES DRAWN AEROBIC AND ANAEROBIC Blood Culture adequate volume Performed at [facility], [address] [place], [place] [address]    Culture  Setup Time   Final    IN BOTH AEROBIC AND ANAEROBIC BOTTLES GRAM NEGATIVE COCCOBACILLI CRITICAL RESULT CALLED TO, READ BACK BY AND VERIFIED WITH: KAREN HAYES AT  6301 ON 03/01/2018 JJB    Culture (A)  Final    HAEMOPHILUS INFLUENZAE BETA LACTAMASE NEGATIVE HEALTH DEPARTMENT NOTIFIED Referred to [facility] in [place], [place] for serotyping. Performed at [facility], [place] [address], [place], [place] [address]    Report Status PENDING  Incomplete  Blood culture (routine x 2)     Status: None (Preliminary result)   Collection Time: 02/28/18 10:11 AM  Result Value Ref Range Status   Specimen Description BLOOD BLOOD RIGHT HAND  Final   Special Requests   Final    BOTTLES DRAWN AEROBIC AND ANAEROBIC Blood Culture results may not be optimal due to an inadequate volume of blood received in culture bottles   Culture   Final    NO GROWTH 4 DAYS Performed at [facility], [address] [place], [place] [address]    Report  Status PENDING  Incomplete  Blood Culture ID Panel (Reflexed)     Status: Abnormal   Collection Time: 02/28/18 10:11 AM  Result Value Ref Range Status   Enterococcus species NOT DETECTED NOT DETECTED Final   Listeria monocytogenes NOT DETECTED NOT DETECTED Final   Staphylococcus species NOT DETECTED NOT DETECTED Final   Staphylococcus aureus (BCID) NOT DETECTED NOT DETECTED Final   Streptococcus species NOT DETECTED NOT DETECTED Final   Streptococcus agalactiae NOT DETECTED NOT DETECTED Final   Streptococcus pneumoniae NOT DETECTED NOT DETECTED Final   Streptococcus pyogenes NOT DETECTED NOT DETECTED Final   Acinetobacter baumannii NOT DETECTED NOT DETECTED Final   Enterobacteriaceae species NOT DETECTED NOT DETECTED Final   Enterobacter cloacae complex NOT DETECTED NOT DETECTED Final   Escherichia coli NOT DETECTED NOT DETECTED Final   Klebsiella oxytoca NOT DETECTED NOT DETECTED Final   Klebsiella pneumoniae NOT DETECTED NOT DETECTED Final   Proteus species NOT DETECTED NOT DETECTED Final   Serratia marcescens NOT DETECTED NOT DETECTED Final   Haemophilus influenzae DETECTED (A) NOT DETECTED Final    Comment: CRITICAL RESULT CALLED TO, READ BACK BY AND VERIFIED WITH: KAREN HAYES AT 0826 ON 03/01/2018 JJB    Neisseria meningitidis NOT DETECTED NOT DETECTED Final   Pseudomonas aeruginosa NOT DETECTED NOT DETECTED Final   Candida albicans NOT DETECTED NOT DETECTED Final   Candida glabrata NOT DETECTED NOT DETECTED Final   Candida krusei NOT DETECTED NOT DETECTED Final   Candida parapsilosis NOT DETECTED NOT DETECTED Final   Candida tropicalis NOT DETECTED NOT DETECTED Final    Comment: Performed at [facility], [address] [place], [place] [address]  MRSA PCR Screening     Status: None   Collection Time: 02/28/18 12:31 PM  Result Value Ref Range Status   MRSA by PCR NEGATIVE NEGATIVE Final    Comment:        The GeneXpert MRSA Assay (FDA approved for NASAL  specimens only), is one component of a comprehensive MRSA colonization surveillance program. It is not intended to diagnose MRSA infection nor to guide or monitor treatment for MRSA infections. Performed at [facility], [address] [place], [place] [address]   Culture, blood (single) w Reflex to ID Panel     Status: None (Preliminary result)   Collection Time: 02/28/18  6:05 PM  Result Value Ref Range Status   Specimen Description BLOOD BLOOD LEFT HAND  Final   Special Requests   Final    BOTTLES DRAWN AEROBIC ONLY Blood Culture results may not be optimal due to an inadequate volume of blood  received in culture bottles   Culture   Final    NO GROWTH 4 DAYS Performed at [facility], [address] [place], [place] [address]    Report Status PENDING  Incomplete  Respiratory Panel by PCR     Status: None   Collection Time: 02/28/18  8:56 PM  Result Value Ref Range Status   Adenovirus NOT DETECTED NOT DETECTED Final   Coronavirus 229E NOT DETECTED NOT DETECTED Final    Comment: (NOTE) The Coronavirus on the Respiratory Panel, DOES NOT test for the novel  Coronavirus (2019 nCoV)    Coronavirus HKU1 NOT DETECTED NOT DETECTED Final   Coronavirus NL63 NOT DETECTED NOT DETECTED Final   Coronavirus OC43 NOT DETECTED NOT DETECTED Final   Metapneumovirus NOT DETECTED NOT DETECTED Final   Rhinovirus / Enterovirus NOT DETECTED NOT DETECTED Final   Influenza A NOT DETECTED NOT DETECTED Final   Influenza B NOT DETECTED NOT DETECTED Final   Parainfluenza Virus 1 NOT DETECTED NOT DETECTED Final   Parainfluenza Virus 2 NOT DETECTED NOT DETECTED Final   Parainfluenza Virus 3 NOT DETECTED NOT DETECTED Final   Parainfluenza Virus 4 NOT DETECTED NOT DETECTED Final   Respiratory Syncytial Virus NOT DETECTED NOT DETECTED Final   Bordetella pertussis NOT DETECTED NOT DETECTED Final   Chlamydophila pneumoniae NOT DETECTED NOT DETECTED Final   Mycoplasma pneumoniae NOT  DETECTED NOT DETECTED Final    Comment: Performed at [facility], [place], [place], [place] [address]    Coagulation Studies: No results for input(s): LABPROT, INR in the last 72 hours.  Urinalysis: No results for input(s): COLORURINE, LABSPEC, PHURINE, GLUCOSEU, HGBUR, BILIRUBINUR, KETONESUR, PROTEINUR, UROBILINOGEN, NITRITE, LEUKOCYTESUR in the last 72 hours.  Invalid input(s): APPERANCEUR    Imaging: No results found.   Medications:   . sodium chloride Stopped (03/04/18 1200)  . cefTRIAXone (ROCEPHIN)  IV 2 g (03/04/18 0934)   . heparin  5,000 Units Subcutaneous Q8H  . insulin aspart  0-20 Units Subcutaneous TID AC & HS  . insulin aspart  4 Units Subcutaneous TID WC  . insulin glargine  24 Units Subcutaneous QHS  . ipratropium-albuterol  3 mL Nebulization Q6H  . ENSURE MAX PROTEIN  11 oz Oral BID BM  . sodium chloride flush  10-40 mL Intracatheter Q12H   sodium chloride, acetaminophen **OR** acetaminophen, albuterol, ondansetron **OR** ondansetron (ZOFRAN) IV, polyethylene glycol, sodium chloride flush  Assessment/ Plan:  53 y.o. male with a PMHx of diabetes mellitus type 2, morbid obesity, hypertension, gout, asthma, seasonal allergies, chronic kidney disease stage III baseline EGFR 41, who was admitted to [facility] on 02/28/2018 for evaluation of hypotension.   1.  Acute renal failure/chronic kidney disease stage III/diabetes mellitus type 2 with chronic kidney disease.  Acute renal failure secondary to prolonged dehydration and hypotension as well as ibuprofen use. -BUN still high at 120 however creatinine down to 3.07.  Urine output was 1.3 L over the preceding 24 hours.  No acute indication for dialysis at the moment.  Continue to monitor renal parameters daily for now.  2.  Hyperkalemia.    Potassium up to 5.6.  Start the patient on Lokelma 10 g p.o. twice daily.  3.  Hypotension.  Resolved at this time.     LOS: 4 [name] 2/7/202012:14 PM

## 2018-03-04 NOTE — Progress Notes (Signed)
Pt sugars elevated to 477 during shift. MD notified and orders for insulin regular given. Subsequent sugar 413. MD notified and insulin regular given. Subsequent sugar 352. MD notified, no new orders given. Continue to monitor.

## 2018-03-04 NOTE — Care Management Important Message (Signed)
Important Message  Patient Details  Name: ANTAEUS KAREL MRN: [id number] Date of Birth: 1965-08-08   Medicare Important Message Given:  Yes    Juliann Pulse A Tami Blass 03/04/2018, 11:30 AM

## 2018-03-04 NOTE — Plan of Care (Signed)
Nutrition Education Note  RD consulted for nutrition education regarding diabetes, worsening renal function, and obesity.  Spoke with pt at bedside. Pt lethargic and seemed to be falling asleep throughout RD visit. Pt was not able to provide much information regarding PO intake PTA. Pt was able to report that he eats 1 meal daily and that he ate a lot of Nabs and bananas in the week PTA. Pt unable to elaborate further.  Lab Results  Component Value Date   HGBA1C 8.1 (H) 02/28/2018    RD provided "Carbohydrate Counting for People with Diabetes" handout from the Academy of Nutrition and Dietetics. Discussed different food groups and their effects on blood sugar, emphasizing carbohydrate-containing foods. Provided list of carbohydrates and recommended serving sizes of common foods.  Discussed importance of controlled and consistent carbohydrate intake throughout the day. Provided examples of ways to balance meals/snacks and encouraged intake of high-fiber, whole grain complex carbohydrates. Teach back method used.  Expect poor to fair compliance.  Body mass index is 54.85 kg/m. Pt meets criteria for morbid obesity based on current BMI.  Current diet order is Carb Modified, patient is consuming approximately 100% of meals at this time. Labs and medications reviewed. No further nutrition interventions warranted at this time. If pt is more alert and has questions, please re-consult RD. RD contact information provided. If additional nutrition issues arise, also please re-consult RD.   Gaynell Face, MS, RD, LDN Inpatient Clinical Dietitian Pager: [phone] Weekend/After Hours: [phone]

## 2018-03-05 LAB — BASIC METABOLIC PANEL
ANION GAP: 7 (ref 5–15)
BUN: 105 mg/dL — ABNORMAL HIGH (ref 6–20)
CO2: 28 mmol/L (ref 22–32)
Calcium: 8.6 mg/dL — ABNORMAL LOW (ref 8.9–10.3)
Chloride: 104 mmol/L (ref 98–111)
Creatinine, Ser: 2.21 mg/dL — ABNORMAL HIGH (ref 0.61–1.24)
GFR calc Af Amer: 39 mL/min — ABNORMAL LOW (ref >60)
GFR calc non Af Amer: 33 mL/min — ABNORMAL LOW (ref >60)
Glucose, Bld: 185 mg/dL — ABNORMAL HIGH (ref 70–99)
Potassium: 5.3 mmol/L — ABNORMAL HIGH (ref 3.5–5.1)
Sodium: 139 mmol/L (ref 135–145)

## 2018-03-05 LAB — CULTURE, BLOOD (ROUTINE X 2): Culture: NO GROWTH

## 2018-03-05 LAB — CULTURE, BLOOD (SINGLE): Culture: NO GROWTH

## 2018-03-05 LAB — GLUCOSE, CAPILLARY
Glucose-Capillary: 151 mg/dL — ABNORMAL HIGH (ref 70–99)
Glucose-Capillary: 230 mg/dL — ABNORMAL HIGH (ref 70–99)

## 2018-03-05 MED ORDER — AMOXICILLIN-POT CLAVULANATE 875-125 MG PO TABS: 1.0000 | ORAL_TABLET | Freq: Two times a day (BID) | ORAL | 0 refills | Status: AC

## 2018-03-05 MED ORDER — SODIUM POLYSTYRENE SULFONATE 15 GM/60ML PO SUSP
15.0000 g | Freq: Once | ORAL | Status: AC
  Administered 2018-03-05: 11:00:00 15 g via ORAL
  Filled 2018-03-05: qty 60

## 2018-03-05 NOTE — Progress Notes (Signed)
Advanced care plan.  Purpose of the Encounter: CODE STATUS  Parties in Attendance: Patient  Patient's Decision Capacity: Good  Subjective/Patient's story: Presented to emergency room for low blood pressure   Objective/Medical story Patient has severe sepsis secondary to pneumonia and respiratory distress with h or ypoxia.  Needs IV fluids and antibiotics.  Patient also has worsening renal failure.   Goals of care determination:  Advance care directives goals of care and treatment plan discussed No patient wants everything done which includes CPR, intubation ventilator if the need arises.   CODE STATUS: Full code   Time spent discussing advanced care planning: 16 minutes

## 2018-03-05 NOTE — Discharge Summary (Signed)
[facility] NAME: [name]    MR#:  [id number]  DATE OF BIRTH:  July 05, 1965  DATE OF ADMISSION:  02/28/2018 ADMITTING PHYSICIAN: Hillary Bow, MD  DATE OF DISCHARGE: 03/05/2018  PRIMARY CARE PHYSICIAN: [name], DO   ADMISSION DIAGNOSIS:  Uremia [N19] Encephalopathy acute [G93.40] Acute respiratory failure with hypoxia (HCC) [J96.01] Acute renal failure, unspecified acute renal failure type ([place]) [N17.9] Community acquired pneumonia of right lower lobe of lung ([place]) [J18.1]  DISCHARGE DIAGNOSIS:  Sepsis secondary to pneumonia Acute hypoxic respiratory failure Acute kidney injury over CKD stage IV Acute metabolic encephalopathy Chronic diastolic heart failure Type 2 diabetes mellitus  SECONDARY DIAGNOSIS:   Past Medical History:  Diagnosis Date  . Allergy   . Asthma   . Gout   . Hypertension   . Morbid obesity ([place])      ADMITTING HISTORY [name]  is a 53 y.o. male with a known history of insulin-dependent diabetes mellitus, hypertension, CKD stage III, chronic diastolic congestive heart failure with ejection fraction 50%, tobacco use, daily alcohol use presents to the emergency room brought in by her friend due to weakness, lightheaded, shortness of breath and cough.  Poor historian.  Falls back to sleep easily.  Seems to be oriented while having a conversation.Patient was found to be hypotensive with systolic blood pressure in the 70s and has received 750 mL normal saline to this point.  Saturations in the 80s and needing 2 L oxygen.  Found to have acute kidney injury with creatinine of 7, leukocytosis, right lower lobe pneumonia on chest x-ray.  Patient is being admitted to the stepdown unit with severe sepsis, pneumonia.  HOSPITAL COURSE:  Patient was admitted to stepdown unit initially.  He was given IV fluids and started on IV ceftriaxone and Zithromax antibiotics.  Nephrology consult was done  for renal failure.  Infectious disease consultation was also done during hospitalization.  Patient had septic shock with multiorgan failure secondary to haemophilus influenza pneumonia and bacteremia beta-lactamase negative.  Patient received IV Rocephin antibiotic.  He was weaned off the IV pressor medication.  Septic shock resolved.  His kidney functions also improved on a day-to-day basis.  Hyperkalemia also improved.  Case was discussed with infectious disease attending who recommended Augmentin antibiotic at the time of discharge.  Patient very eager to go home.  He will be discharged home on Augmentin antibiotic.  Follow-up with nephrology in the clinic and primary care physician in the clinic.  CONSULTS OBTAINED:  Treatment Team:  Anthonette Legato, MD  DRUG ALLERGIES:  No Known Allergies  DISCHARGE MEDICATIONS:   Allergies as of 03/05/2018   No Known Allergies     Medication List    TAKE these medications   albuterol 108 (90 Base) MCG/ACT inhaler Commonly known as:  VENTOLIN HFA Inhale 2 puffs into the lungs every 4 (four) hours as needed for wheezing or shortness of breath.   albuterol (2.5 MG/3ML) 0.083% nebulizer solution Commonly known as:  PROVENTIL Take 3 mLs (2.5 mg total) by nebulization every 6 (six) hours as needed for wheezing or shortness of breath.   allopurinol 100 MG tablet Commonly known as:  ZYLOPRIM Take 1 tablet (100 mg total) by mouth daily.   amoxicillin-clavulanate 875-125 MG tablet Commonly known as:  AUGMENTIN Take 1 tablet by mouth every 12 (twelve) hours for 7 days.   aspirin 81 MG chewable tablet Chew 81 mg by mouth daily.   atorvastatin 20  MG tablet Commonly known as:  LIPITOR Take 1 tablet (20 mg total) by mouth daily.   BASAGLAR KWIKPEN 100 UNIT/ML Sopn Inject 10 Units into the skin daily. Submitted to [employer] for assistance   blood glucose meter kit and supplies Kit Brand of choice; LON 99 months; check FSBS 3x a day; E11.65; disp  one meter with 100 strips + 1 refill of strips   bumetanide 1 MG tablet Commonly known as:  BUMEX Take 1 mg by mouth daily.   carvedilol 25 MG tablet Commonly known as:  COREG Take 1 tablet (25 mg total) by mouth 2 (two) times daily with a meal. What changed:  how much to take   gabapentin 100 MG capsule Commonly known as:  NEURONTIN Start 1 capsule daily, increase by 1 cap every 2-3 days as tolerated up to 1-3 capsules per dose 2 times a day   Insulin Pen Needle 31G X 5 MM Misc Commonly known as:  B-D UF III MINI PEN NEEDLES 1 each by Does not apply route daily. Use once daily with Victoza.   lisinopril 2.5 MG tablet Commonly known as:  PRINIVIL,ZESTRIL Take 1 tablet (2.5 mg total) by mouth daily.   loratadine 10 MG tablet Commonly known as:  CLARITIN Take 1 tablet (10 mg total) by mouth daily.   metolazone 5 MG tablet Commonly known as:  ZAROXOLYN Take 5 mg by mouth daily.   omeprazole 40 MG capsule Commonly known as:  PRILOSEC Take 1 capsule (40 mg total) by mouth daily.   ranitidine 150 MG tablet Commonly known as:  ZANTAC Take 1 tablet (150 mg total) by mouth daily before breakfast.   traMADol 50 MG tablet Commonly known as:  ULTRAM Take 1-2 tablets (50-100 mg total) by mouth every 12 (twelve) hours as needed.   vitamin C 500 MG tablet Commonly known as:  ASCORBIC ACID Take 1,000 mg by mouth daily.   Zinc 50 MG Caps Take 50 mg by mouth daily.       Today  Patient seen and evaluated today No shortness of breath Completely on room air Tolerating diet okay VITAL SIGNS:  Blood pressure 138 / 87 mmHg, pulse rate is 86/min, saturation 94%  I/O:    Intake/Output Summary (Last 24 hours) at 03/05/2018 1150 Last data filed at 03/05/2018 1100 Gross per 24 hour  Intake 378.83 ml  Output 900 ml  Net -521.17 ml    PHYSICAL EXAMINATION:  Physical Exam  GENERAL:  53 y.o.-year-old patient lying in the bed with no acute distress.  LUNGS: Normal breath sounds  bilaterally, no wheezing, rales,rhonchi or crepitation. No use of accessory muscles of respiration.  CARDIOVASCULAR: S1, S2 normal. No murmurs, rubs, or gallops.  ABDOMEN: Soft, non-tender, non-distended. Bowel sounds present. No organomegaly or mass.  NEUROLOGIC: Moves all 4 extremities. PSYCHIATRIC: The patient is alert and oriented x 3.  SKIN: No obvious rash, lesion, or ulcer.   DATA REVIEW:   CBC Recent Labs  Lab 03/02/18 0442  WBC 12.2*  HGB 13.4  HCT 41.8  PLT 147*    Chemistries  Recent Labs  Lab 02/28/18 0909  03/05/18 0531  NA 128*   < > 139  K 5.3*   < > 5.3*  CL 84*   < > 104  CO2 30   < > 28  GLUCOSE 188*   < > 185*  BUN 115*   < > 105*  CREATININE 7.47*   < > 2.21*  CALCIUM 8.2*   < >  8.6*  AST 20  --   --   ALT 17  --   --   ALKPHOS 92  --   --   BILITOT 1.3*  --   --    < > = values in this interval not displayed.    Cardiac Enzymes Recent Labs  Lab 02/28/18 0909  TROPONINI <0.03    Microbiology Results  Results for orders placed or performed during the hospital encounter of 02/28/18  Blood culture (routine x 2)     Status: Abnormal (Preliminary result)   Collection Time: 02/28/18 10:11 AM  Result Value Ref Range Status   Specimen Description   Final    BLOOD RIGHT ANTECUBITAL Performed at [facility], [address] [place], [place] [address]    Special Requests   Final    BOTTLES DRAWN AEROBIC AND ANAEROBIC Blood Culture adequate volume Performed at [facility], [address] [place], [place] [address]    Culture  Setup Time   Final    IN BOTH AEROBIC AND ANAEROBIC BOTTLES GRAM NEGATIVE COCCOBACILLI CRITICAL RESULT CALLED TO, READ BACK BY AND VERIFIED WITH: KAREN HAYES AT [place] ON 03/01/2018 JJB    Culture (A)  Final    HAEMOPHILUS INFLUENZAE BETA LACTAMASE NEGATIVE HEALTH DEPARTMENT NOTIFIED Referred to [facility] in [place], [place] for serotyping. Performed at [facility], [place] [address]  [place], [place] [address]    Report Status PENDING  Incomplete  Blood culture (routine x 2)     Status: None   Collection Time: 02/28/18 10:11 AM  Result Value Ref Range Status   Specimen Description BLOOD BLOOD RIGHT HAND  Final   Special Requests   Final    BOTTLES DRAWN AEROBIC AND ANAEROBIC Blood Culture results may not be optimal due to an inadequate volume of blood received in culture bottles   Culture   Final    NO GROWTH 5 DAYS Performed at [facility], [address] [place], [place] [address]    Report Status 03/05/2018 FINAL  Final  Blood Culture ID Panel (Reflexed)     Status: Abnormal   Collection Time: 02/28/18 10:11 AM  Result Value Ref Range Status   Enterococcus species NOT DETECTED NOT DETECTED Final   Listeria monocytogenes NOT DETECTED NOT DETECTED Final   Staphylococcus species NOT DETECTED NOT DETECTED Final   Staphylococcus aureus (BCID) NOT DETECTED NOT DETECTED Final   Streptococcus species NOT DETECTED NOT DETECTED Final   Streptococcus agalactiae NOT DETECTED NOT DETECTED Final   Streptococcus pneumoniae NOT DETECTED NOT DETECTED Final   Streptococcus pyogenes NOT DETECTED NOT DETECTED Final   Acinetobacter baumannii NOT DETECTED NOT DETECTED Final   Enterobacteriaceae species NOT DETECTED NOT DETECTED Final   Enterobacter cloacae complex NOT DETECTED NOT DETECTED Final   Escherichia coli NOT DETECTED NOT DETECTED Final   Klebsiella oxytoca NOT DETECTED NOT DETECTED Final   Klebsiella pneumoniae NOT DETECTED NOT DETECTED Final   Proteus species NOT DETECTED NOT DETECTED Final   Serratia marcescens NOT DETECTED NOT DETECTED Final   Haemophilus influenzae DETECTED (A) NOT DETECTED Final    Comment: CRITICAL RESULT CALLED TO, READ BACK BY AND VERIFIED WITH: KAREN HAYES AT 4481 ON 03/01/2018 JJB    Neisseria meningitidis NOT DETECTED NOT DETECTED Final   Pseudomonas aeruginosa NOT DETECTED NOT DETECTED Final   Candida albicans NOT DETECTED NOT  DETECTED Final   Candida glabrata NOT DETECTED NOT DETECTED Final   Candida krusei NOT DETECTED  NOT DETECTED Final   Candida parapsilosis NOT DETECTED NOT DETECTED Final   Candida tropicalis NOT DETECTED NOT DETECTED Final    Comment: Performed at [facility], [address] [place], [place] [address]  MRSA PCR Screening     Status: None   Collection Time: 02/28/18 12:31 PM  Result Value Ref Range Status   MRSA by PCR NEGATIVE NEGATIVE Final    Comment:        The GeneXpert MRSA Assay (FDA approved for NASAL specimens only), is one component of a comprehensive MRSA colonization surveillance program. It is not intended to diagnose MRSA infection nor to guide or monitor treatment for MRSA infections. Performed at [name], [address] [place], [place] [address]   Culture, blood (single) w Reflex to ID Panel     Status: None   Collection Time: 02/28/18  6:05 PM  Result Value Ref Range Status   Specimen Description BLOOD BLOOD LEFT HAND  Final   Special Requests   Final    BOTTLES DRAWN AEROBIC ONLY Blood Culture results may not be optimal due to an inadequate volume of blood received in culture bottles   Culture   Final    NO GROWTH 5 DAYS Performed at [facility], [address] [place], [place] [address]    Report Status 03/05/2018 FINAL  Final  Respiratory Panel by PCR     Status: None   Collection Time: 02/28/18  8:56 PM  Result Value Ref Range Status   Adenovirus NOT DETECTED NOT DETECTED Final   Coronavirus 229E NOT DETECTED NOT DETECTED Final    Comment: (NOTE) The Coronavirus on the Respiratory Panel, DOES NOT test for the novel  Coronavirus (2019 nCoV)    Coronavirus HKU1 NOT DETECTED NOT DETECTED Final   Coronavirus NL63 NOT DETECTED NOT DETECTED Final   Coronavirus OC43 NOT DETECTED NOT DETECTED Final   Metapneumovirus NOT DETECTED NOT DETECTED Final   Rhinovirus / Enterovirus NOT DETECTED NOT DETECTED Final   Influenza  A NOT DETECTED NOT DETECTED Final   Influenza B NOT DETECTED NOT DETECTED Final   Parainfluenza Virus 1 NOT DETECTED NOT DETECTED Final   Parainfluenza Virus 2 NOT DETECTED NOT DETECTED Final   Parainfluenza Virus 3 NOT DETECTED NOT DETECTED Final   Parainfluenza Virus 4 NOT DETECTED NOT DETECTED Final   Respiratory Syncytial Virus NOT DETECTED NOT DETECTED Final   Bordetella pertussis NOT DETECTED NOT DETECTED Final   Chlamydophila pneumoniae NOT DETECTED NOT DETECTED Final   Mycoplasma pneumoniae NOT DETECTED NOT DETECTED Final    Comment: Performed at [facility], [place] [address] [place], [place] [address]    RADIOLOGY:  No results found.  Follow up with PCP in 1 week.  Management plans discussed with the patient, family and they are in agreement.  CODE STATUS: Full code    Code Status Orders  (From admission, onward)         Start     Ordered   02/28/18 1059  Full code  Continuous     02/28/18 1059        Code Status History    This patient has a current code status but no historical code status.      TOTAL TIME TAKING CARE OF THIS PATIENT ON DAY OF DISCHARGE: more than 35 minutes.   Saundra Shelling M.D on 03/05/2018 at 11:50 AM  Between 7am to 6pm - Pager - [phone]  After 6pm go to [email or web address] -  password [facility] [place]  Office  [phone]  CC: Primary care physician; [name], DO  Note: This dictation was prepared with Dragon dictation along with smaller phrase technology. Any transcriptional errors that result from this process are unintentional.

## 2018-03-05 NOTE — Progress Notes (Signed)
Pt restless and mildy agitated. Insistent on going home. Up ad lib in room. IV Rocephin given. Noted decreased sounds posterior RLL with fine crackles- pt advised to deep breath/cough every hour WA,. Frequent dry,nonproductive cough. No respiratory distress. States he understands AVS- stressed importance of compliance with health care management; seek early intervention at onset of illness/symptoms. Oral and written AVS instructions given with stated understanding. Pt called his pharmacy to make arrangements for Augmentin pick up. Awaiting family for transport home.

## 2018-03-05 NOTE — Discharge Instructions (Signed)
Acute Kidney Injury, Adult ° °Acute kidney injury is a sudden worsening of kidney function. The kidneys are organs that have several jobs. They filter the blood to remove waste products and extra fluid. They also maintain a healthy balance of minerals and hormones in the body, which helps control blood pressure and keep bones strong. With this condition, your kidneys do not do their jobs as well as they should. °This condition ranges from mild to severe. Over time it may develop into long-lasting (chronic) kidney disease. Early detection and treatment may prevent acute kidney injury from developing into a chronic condition. °What are the causes? °Common causes of this condition include: °· A problem with blood flow to the kidneys. This may be caused by: °? Low blood pressure (hypotension) or shock. °? Blood loss. °? Heart and blood vessel (cardiovascular) disease. °? Severe burns. °? Liver disease. °· Direct damage to the kidneys. This may be caused by: °? Certain medicines. °? A kidney infection. °? Poisoning. °? Being around or in contact with toxic substances. °? A surgical wound. °? A hard, direct hit to the kidney area. °· A sudden blockage of urine flow. This may be caused by: °? Cancer. °? Kidney stones. °? An enlarged prostate in males. °What are the signs or symptoms? °Symptoms of this condition may not be obvious until the condition becomes severe. Symptoms of this condition can include: °· Tiredness (lethargy), or difficulty staying awake. °· Nausea or vomiting. °· Swelling (edema) of the face, legs, ankles, or feet. °· Problems with urination, such as: °? Abdominal pain, or pain along the side of your stomach (flank). °? Decreased urine production. °? Decrease in the force of urine flow. °· Muscle twitches and cramps, especially in the legs. °· Confusion or trouble concentrating. °· Loss of appetite. °· Fever. °How is this diagnosed? °This condition may be diagnosed with tests, including: °· Blood  tests. °· Urine tests. °· Imaging tests. °· A test in which a sample of tissue is removed from the kidneys to be examined under a microscope (kidney biopsy). °How is this treated? °Treatment for this condition depends on the cause and how severe the condition is. In mild cases, treatment may not be needed. The kidneys may heal on their own. In more severe cases, treatment will involve: °· Treating the cause of the kidney injury. This may involve changing any medicines you are taking or adjusting your dosage. °· Fluids. You may need specialized IV fluids to balance your body's needs. °· Having a catheter placed to drain urine and prevent blockages. °· Preventing problems from occurring. This may mean avoiding certain medicines or procedures that can cause further injury to the kidneys. °In some cases treatment may also require: °· A procedure to remove toxic wastes from the body (dialysis or continuous renal replacement therapy - CRRT). °· Surgery. This may be done to repair a torn kidney, or to remove the blockage from the urinary system. °Follow these instructions at home: °Medicines °· Take over-the-counter and prescription medicines only as told by your health care provider. °· Do not take any new medicines without your health care provider's approval. Many medicines can worsen your kidney damage. °· Do not take any vitamin and mineral supplements without your health care provider's approval. Many nutritional supplements can worsen your kidney damage. °Lifestyle °· If your health care provider prescribed changes to your diet, follow them. You may need to decrease the amount of protein you eat. °· Achieve and maintain a healthy   weight. If you need help with this, ask your health care provider.  Start or continue an exercise plan. Try to exercise at least 30 minutes a day, 5 days a week.  Do not use any tobacco products, such as cigarettes, chewing tobacco, and e-cigarettes. If you need help quitting, ask your  health care provider. General instructions  Keep track of your blood pressure. Report changes in your blood pressure as told by your health care provider.  Stay up to date with immunizations. Ask your health care provider which immunizations you need.  Keep all follow-up visits as told by your health care provider. This is important. Where to find more information  American Association of Kidney Patients: BombTimer.gl  National Kidney Foundation: www.kidney.[place]: https://mathis.com/  Life Options Rehabilitation Program: ? www.lifeoptions.org ? www.kidneyschool.org Contact a health care provider if:  Your symptoms get worse.  You develop new symptoms. Get help right away if:  You develop symptoms of worsening kidney disease, which include: ? Headaches. ? Abnormally dark or light skin. ? Easy bruising. ? Frequent hiccups. ? Chest pain. ? Shortness of breath. ? End of menstruation in women. ? Seizures. ? Confusion or altered mental status. ? Abdominal or back pain. ? Itchiness.  You have a fever.  Your body is producing less urine.  You have pain or bleeding when you urinate. Summary  Acute kidney injury is a sudden worsening of kidney function.  Acute kidney injury can be caused by problems with blood flow to the kidneys, direct damage to the kidneys, and sudden blockage of urine flow.  Symptoms of this condition may not be obvious until it becomes severe. Symptoms may include edema, lethargy, confusion, nausea or vomiting, and problems passing urine.  This condition can usually be diagnosed with blood tests, urine tests, and imaging tests. Sometimes a kidney biopsy is done to diagnose this condition.  Treatment for this condition often involves treating the underlying cause. It is treated with fluids, medicines, dialysis, diet changes, or surgery. This information is not intended to replace advice given to you by your health care provider. Make  sure you discuss any questions you have with your health care provider. Document Released: 07/28/2010 Document Revised: 05/14/2016 Document Reviewed: 01/03/2016 Elsevier Interactive Patient Education  2019 [place] Pneumonia, Adult Pneumonia is an infection of the lungs. It causes swelling in the airways of the lungs. Mucus and fluid may also build up inside the airways. One type of pneumonia can happen while a person is in a hospital. A different type can happen when a person is not in a hospital (community-acquired pneumonia).  What are the causes?  This condition is caused by germs (viruses, bacteria, or fungi). Some types of germs can be passed from one person to another. This can happen when you breathe in droplets from the cough or sneeze of an infected person. What increases the risk? You are more likely to develop this condition if you:  Have a long-term (chronic) disease, such as: ? Chronic obstructive pulmonary disease (COPD). ? Asthma. ? Cystic fibrosis. ? Congestive heart failure. ? Diabetes. ? Kidney disease.  Have HIV.  Have sickle cell disease.  Have had your spleen removed.  Do not take good care of your teeth and mouth (poor dental hygiene).  Have a medical condition that increases the risk of breathing in droplets from your own mouth and nose.  Have a weakened body defense system (immune system).  Are a smoker.  Travel to areas where the germs that cause this illness are common.  Are around certain animals or the places they live. What are the signs or symptoms?  A dry cough.  A wet (productive) cough.  Fever.  Sweating.  Chest pain. This often happens when breathing deeply or coughing.  Fast breathing or trouble breathing.  Shortness of breath.  Shaking chills.  Feeling tired (fatigue).  Muscle aches. How is this treated? Treatment for this condition depends on many things. Most adults can be treated at home.  In some cases, treatment must happen in a hospital. Treatment may include:  Medicines given by mouth or through an IV tube.  Being given extra oxygen.  Respiratory therapy. In rare cases, treatment for very bad pneumonia may include:  Using a machine to help you breathe.  Having a procedure to remove fluid from around your lungs. Follow these instructions at home: Medicines  Take over-the-counter and prescription medicines only as told by your doctor. ? Only take cough medicine if you are losing sleep.  If you were prescribed an antibiotic medicine, take it as told by your doctor. Do not stop taking the antibiotic even if you start to feel better. General instructions   Sleep with your head and neck raised (elevated). You can do this by sleeping in a recliner or by putting a few pillows under your head.  Rest as needed. Get at least 8 hours of sleep each night.  Drink enough water to keep your pee (urine) pale yellow.  Eat a healthy diet that includes plenty of vegetables, fruits, whole grains, low-fat dairy products, and lean protein.  Do not use any products that contain nicotine or tobacco. These include cigarettes, e-cigarettes, and chewing tobacco. If you need help quitting, ask your doctor.  Keep all follow-up visits as told by your doctor. This is important. How is this prevented? A shot (vaccine) can help prevent pneumonia. Shots are often suggested for:  People older than 52 years of age.  People older than 52 years of age who: ? Are having cancer treatment. ? Have long-term (chronic) lung disease. ? Have problems with their body's defense system. You may also prevent pneumonia if you take these actions:  Get the flu (influenza) shot every year.  Go to the dentist as often as told.  Wash your hands often. If you cannot use soap and water, use hand sanitizer. Contact a doctor if:  You have a fever.  You lose sleep because your cough medicine does not  help. Get help right away if:  You are short of breath and it gets worse.  You have more chest pain.  Your sickness gets worse. This is very serious if: ? You are an older adult. ? Your body's defense system is weak.  You cough up blood. Summary  Pneumonia is an infection of the lungs.  Most adults can be treated at home. Some will need treatment in a hospital.  Drink enough water to keep your pee pale yellow.  Get at least 8 hours of sleep each night. This information is not intended to replace advice given to you by your health care provider. Make sure you discuss any questions you have with your health care provider. Document Released: 07/01/2007 Document Revised: 09/09/2017 Document Reviewed: 09/09/2017 Elsevier Interactive Patient Education  2019 [employer].

## 2018-03-05 NOTE — Progress Notes (Signed)
[facility]  ROUNDING NOTE   Subjective:  Patient seen at bedside. Renal function improving. Cr down to 2.2.  K still slightly high 5.3.    Objective:  Vital signs in last 24 hours:  Temp:  [97.9 F (36.6 C)-100.6 F (38.1 C)] 97.9 F (36.6 C) (02/08 0827) Pulse Rate:  [97-106] 104 (02/08 0827) Resp:  [18-23] 22 (02/08 0827) BP: (132-140)/(80-101) 134/87 (02/08 0827) SpO2:  [90 %-100 %] 93 % (02/08 0827)  Weight change:  Filed Weights   03/01/18 0330 03/02/18 0448 03/03/18 0236  Weight: (!) 174.5 kg (!) 172 kg (!) 178.4 kg    Intake/Output: I/O last 3 completed shifts: In: 250.2 [P.O.:240; I.V.:10.2] Out: 900 [Urine:900]   Intake/Output this shift:  Total I/O In: 128.7 [P.O.:120; I.V.:8.7] Out: -   Physical Exam: General: Sitting up in bed  Head: Normocephalic, atraumatic. Moist oral mucosal membranes  Eyes: Anicteric  Neck: Supple, trachea midline  Lungs:  Scattered rhonchi, normal effort  Heart: S1S2 no rubs  Abdomen:  Soft, nontender, bowel sounds present  Extremities: 2+ peripheral edema.  Neurologic: Awake, alert, following commands  Skin: No lesions       Basic Metabolic Panel: Recent Labs  Lab 03/01/18 0343  03/01/18 2156 03/02/18 0442 03/03/18 0528 03/04/18 0558 03/05/18 0531  NA 128*  --   --  132* 133* 134* 139  K 5.9*   < > 5.4* 5.1 5.0 5.6* 5.3*  CL 89*  --   --  93* 98 100 104  CO2 27  --   --  _0 GLUCOSE 294*  --   --  260* 258* 414* 185*  BUN 117*  --   --  128* 130* 120* 105*  CREATININE 7.44*  --   --  6.33* 4.41* 3.07* 2.21*  CALCIUM 7.5*  --   --  7.9* 8.0* 8.5* 8.6*   < > = values in this interval not displayed.    Liver Function Tests: Recent Labs  Lab 02/28/18 0909  AST 20  ALT 17  ALKPHOS 92  BILITOT 1.3*  PROT 7.1  ALBUMIN 2.5*   No results for input(s): LIPASE, AMYLASE in the last 168 hours. No results for input(s): AMMONIA in the last 168 hours.  CBC: Recent Labs  Lab 02/28/18 0909  03/01/18 0343 03/02/18 0442  WBC 23.3* 20.5* 12.2*  NEUTROABS 21.0*  --   --   HGB 14.8 14.1 13.4  HCT 45.7 45.0 41.8  MCV 93.6 96.2 94.4  PLT 152 144* 147*    Cardiac Enzymes: Recent Labs  Lab 02/28/18 0909  TROPONINI <0.03    BNP: Invalid input(s): POCBNP  CBG: Recent Labs  Lab 03/04/18 1201 03/04/18 1653 03/04/18 2107 03/05/18 0255 03/05/18 0729  GLUCAP 374* 259* 129* 151* 230*    Microbiology: Results for orders placed or performed during the hospital encounter of 02/28/18  Blood culture (routine x 2)     Status: Abnormal (Preliminary result)   Collection Time: 02/28/18 10:11 AM  Result Value Ref Range Status   Specimen Description   Final    BLOOD RIGHT ANTECUBITAL Performed at [facility], [address] [place], [place] [address]    Special Requests   Final    BOTTLES DRAWN AEROBIC AND ANAEROBIC Blood Culture adequate volume Performed at [facility], [address] [place], [place] [address]    Culture  Setup Time   Final    IN BOTH AEROBIC AND ANAEROBIC BOTTLES Lonell Grandchild  NEGATIVE COCCOBACILLI CRITICAL RESULT CALLED TO, READ BACK BY AND VERIFIED WITH: KAREN HAYES AT 0826 ON 03/01/2018 JJB    Culture (A)  Final    HAEMOPHILUS INFLUENZAE BETA LACTAMASE NEGATIVE HEALTH DEPARTMENT NOTIFIED Referred to [facility] in [place], [place] for serotyping. Performed at [facility], [place] [address] [place], [place] [address]    Report Status PENDING  Incomplete  Blood culture (routine x 2)     Status: None   Collection Time: 02/28/18 10:11 AM  Result Value Ref Range Status   Specimen Description BLOOD BLOOD RIGHT HAND  Final   Special Requests   Final    BOTTLES DRAWN AEROBIC AND ANAEROBIC Blood Culture results may not be optimal due to an inadequate volume of blood received in culture bottles   Culture   Final    NO GROWTH 5 DAYS Performed at [facility], [address] [place], [place] [address]    Report Status  03/05/2018 FINAL  Final  Blood Culture ID Panel (Reflexed)     Status: Abnormal   Collection Time: 02/28/18 10:11 AM  Result Value Ref Range Status   Enterococcus species NOT DETECTED NOT DETECTED Final   Listeria monocytogenes NOT DETECTED NOT DETECTED Final   Staphylococcus species NOT DETECTED NOT DETECTED Final   Staphylococcus aureus (BCID) NOT DETECTED NOT DETECTED Final   Streptococcus species NOT DETECTED NOT DETECTED Final   Streptococcus agalactiae NOT DETECTED NOT DETECTED Final   Streptococcus pneumoniae NOT DETECTED NOT DETECTED Final   Streptococcus pyogenes NOT DETECTED NOT DETECTED Final   Acinetobacter baumannii NOT DETECTED NOT DETECTED Final   Enterobacteriaceae species NOT DETECTED NOT DETECTED Final   Enterobacter cloacae complex NOT DETECTED NOT DETECTED Final   Escherichia coli NOT DETECTED NOT DETECTED Final   Klebsiella oxytoca NOT DETECTED NOT DETECTED Final   Klebsiella pneumoniae NOT DETECTED NOT DETECTED Final   Proteus species NOT DETECTED NOT DETECTED Final   Serratia marcescens NOT DETECTED NOT DETECTED Final   Haemophilus influenzae DETECTED (A) NOT DETECTED Final    Comment: CRITICAL RESULT CALLED TO, READ BACK BY AND VERIFIED WITH: KAREN HAYES AT 0826 ON 03/01/2018 JJB    Neisseria meningitidis NOT DETECTED NOT DETECTED Final   Pseudomonas aeruginosa NOT DETECTED NOT DETECTED Final   Candida albicans NOT DETECTED NOT DETECTED Final   Candida glabrata NOT DETECTED NOT DETECTED Final   Candida krusei NOT DETECTED NOT DETECTED Final   Candida parapsilosis NOT DETECTED NOT DETECTED Final   Candida tropicalis NOT DETECTED NOT DETECTED Final    Comment: Performed at [facility], [address] [place], [place] [address]  MRSA PCR Screening     Status: None   Collection Time: 02/28/18 12:31 PM  Result Value Ref Range Status   MRSA by PCR NEGATIVE NEGATIVE Final    Comment:        The GeneXpert MRSA Assay (FDA approved for NASAL  specimens only), is one component of a comprehensive MRSA colonization surveillance program. It is not intended to diagnose MRSA infection nor to guide or monitor treatment for MRSA infections. Performed at [facility], [address] [place], [place] [address]   Culture, blood (single) w Reflex to ID Panel     Status: None   Collection Time: 02/28/18  6:05 PM  Result Value Ref Range Status   Specimen Description BLOOD BLOOD LEFT HAND  Final   Special Requests   Final    BOTTLES DRAWN AEROBIC ONLY Blood Culture  results may not be optimal due to an inadequate volume of blood received in culture bottles   Culture   Final    NO GROWTH 5 DAYS Performed at [facility], [address] [place], [place] [address]    Report Status 03/05/2018 FINAL  Final  Respiratory Panel by PCR     Status: None   Collection Time: 02/28/18  8:56 PM  Result Value Ref Range Status   Adenovirus NOT DETECTED NOT DETECTED Final   Coronavirus 229E NOT DETECTED NOT DETECTED Final    Comment: (NOTE) The Coronavirus on the Respiratory Panel, DOES NOT test for the novel  Coronavirus (2019 nCoV)    Coronavirus HKU1 NOT DETECTED NOT DETECTED Final   Coronavirus NL63 NOT DETECTED NOT DETECTED Final   Coronavirus OC43 NOT DETECTED NOT DETECTED Final   Metapneumovirus NOT DETECTED NOT DETECTED Final   Rhinovirus / Enterovirus NOT DETECTED NOT DETECTED Final   Influenza A NOT DETECTED NOT DETECTED Final   Influenza B NOT DETECTED NOT DETECTED Final   Parainfluenza Virus 1 NOT DETECTED NOT DETECTED Final   Parainfluenza Virus 2 NOT DETECTED NOT DETECTED Final   Parainfluenza Virus 3 NOT DETECTED NOT DETECTED Final   Parainfluenza Virus 4 NOT DETECTED NOT DETECTED Final   Respiratory Syncytial Virus NOT DETECTED NOT DETECTED Final   Bordetella pertussis NOT DETECTED NOT DETECTED Final   Chlamydophila pneumoniae NOT DETECTED NOT DETECTED Final   Mycoplasma pneumoniae NOT DETECTED NOT DETECTED  Final    Comment: Performed at [facility], [place]. [address] [place], [place] [address]    Coagulation Studies: No results for input(s): LABPROT, INR in the last 72 hours.  Urinalysis: No results for input(s): COLORURINE, LABSPEC, PHURINE, GLUCOSEU, HGBUR, BILIRUBINUR, KETONESUR, PROTEINUR, UROBILINOGEN, NITRITE, LEUKOCYTESUR in the last 72 hours.  Invalid input(s): APPERANCEUR    Imaging: No results found.   Medications:   . sodium chloride Stopped (03/05/18 1035)  . cefTRIAXone (ROCEPHIN)  IV Stopped (03/05/18 1115)   . heparin  5,000 Units Subcutaneous Q8H  . insulin aspart  0-20 Units Subcutaneous TID AC & HS  . insulin aspart  4 Units Subcutaneous TID WC  . ipratropium-albuterol  3 mL Nebulization Q6H  . ENSURE MAX PROTEIN  11 oz Oral BID BM  . sodium zirconium cyclosilicate  10 g Oral BID   sodium chloride, acetaminophen **OR** acetaminophen, albuterol, ondansetron **OR** ondansetron (ZOFRAN) IV, polyethylene glycol  Assessment/ Plan:  53 y.o. male with a PMHx of diabetes mellitus type 2, morbid obesity, hypertension, gout, asthma, seasonal allergies, chronic kidney disease stage III baseline EGFR 41, who was admitted to [facility] on 02/28/2018 for evaluation of hypotension.   1.  Acute renal failure/chronic kidney disease stage III/diabetes mellitus type 2 with chronic kidney disease.  Acute renal failure secondary to prolonged dehydration and hypotension as well as ibuprofen use. -BUN down to 105 with a creatinine of 2.2.  Urine output was 900 cc over the preceding 24 hours.  Therefore no acute dialysis.  We will plan for continued monitoring of renal function as an outpatient.  2.  Hyperkalemia.    Potassium down to 5.3 but still slightly high.  Recommend continuing sodium zirconium upon discharge.  3.  Hypotension.  Resolved at this time.  4.  Patient should follow-up with [place] in the office in 1 to 2 weeks post discharge.     LOS: 5    2/8/202011:57 AM

## 2018-03-07 ENCOUNTER — Telehealth: Payer: Self-pay | Admitting: Family Medicine

## 2018-03-07 ENCOUNTER — Telehealth: Payer: Self-pay

## 2018-03-07 DIAGNOSIS — G894 Chronic pain syndrome: Secondary | ICD-10-CM

## 2018-03-07 DIAGNOSIS — M150 Primary generalized (osteo)arthritis: Principal | ICD-10-CM

## 2018-03-07 DIAGNOSIS — M159 Polyosteoarthritis, unspecified: Secondary | ICD-10-CM

## 2018-03-07 MED ORDER — TRAMADOL HCL 50 MG PO TABS: 50.0000 mg | ORAL_TABLET | Freq: Two times a day (BID) | ORAL | 2 refills | Status: DC | PRN

## 2018-03-07 NOTE — Telephone Encounter (Signed)
Pt. Called requesting refill on tramadol

## 2018-03-07 NOTE — Telephone Encounter (Signed)
Refill sent  [name], [facility] 03/07/2018, 11:44 AM

## 2018-03-07 NOTE — Telephone Encounter (Signed)
Transition Care Management Follow-up Telephone Call  Date of discharge and from where: [facility] on 03/05/18.  How have you been since you were released from the hospital? Doing better, legs are still swollen from fluid build up but it is going down. Not coughing or seeing any sputum. Does have some nausea and belching often due to acid reflux. Declines fever, vomiting or diarrhea.   Any questions or concerns? Weakness and post hospital recovery.  Items Reviewed:  Did the pt receive and understand the discharge instructions provided? Yes   Medications obtained and verified? Declined, reviewed new medication (augmentin). Will review the rest at [facility] apt.  Any new allergies since your discharge? No   Dietary orders reviewed? Yes  Do you have support at home? Yes   Other (ie: DME, Home Health, etc) N/A  Functional Questionnaire: (I = Independent and D = Dependent)  Bathing/Dressing- I   Meal Prep- I  Eating- I  Maintaining continence- I  Transferring/Ambulation- I  Managing Meds- I   Follow up appointments reviewed:    PCP Hospital f/u appt confirmed? Yes  Scheduled to see Dr Parks Ranger on 03/08/18 @ 9:20 AM.  [facility] f/u appt confirmed? Yes    Are transportation arrangements needed? No   If their condition worsens, is the pt aware to call  their PCP or go to the ED? Yes  Was the patient provided with contact information for the PCP's office or ED? Yes  Was the pt encouraged to call back with questions or concerns? Yes

## 2018-03-08 ENCOUNTER — Encounter: Payer: Self-pay | Admitting: Family Medicine

## 2018-03-08 ENCOUNTER — Ambulatory Visit (INDEPENDENT_AMBULATORY_CARE_PROVIDER_SITE_OTHER): Payer: PPO | Admitting: Family Medicine

## 2018-03-08 ENCOUNTER — Other Ambulatory Visit: Payer: Self-pay | Admitting: *Deleted

## 2018-03-08 ENCOUNTER — Encounter: Payer: Self-pay | Admitting: *Deleted

## 2018-03-08 VITALS — BP 107/76 | HR 84 | Temp 97.9°F | Resp 16 | Ht 69.0 in | Wt 383.5 lb

## 2018-03-08 DIAGNOSIS — I5022 Chronic systolic (congestive) heart failure: Secondary | ICD-10-CM | POA: Diagnosis not present

## 2018-03-08 DIAGNOSIS — N184 Chronic kidney disease, stage 4 (severe): Secondary | ICD-10-CM | POA: Diagnosis not present

## 2018-03-08 DIAGNOSIS — Z6841 Body Mass Index (BMI) 40.0 and over, adult: Secondary | ICD-10-CM

## 2018-03-08 DIAGNOSIS — E1122 Type 2 diabetes mellitus with diabetic chronic kidney disease: Secondary | ICD-10-CM | POA: Diagnosis not present

## 2018-03-08 DIAGNOSIS — K219 Gastro-esophageal reflux disease without esophagitis: Secondary | ICD-10-CM | POA: Diagnosis not present

## 2018-03-08 DIAGNOSIS — N179 Acute kidney failure, unspecified: Secondary | ICD-10-CM

## 2018-03-08 DIAGNOSIS — J189 Pneumonia, unspecified organism: Secondary | ICD-10-CM | POA: Diagnosis not present

## 2018-03-08 DIAGNOSIS — Z794 Long term (current) use of insulin: Secondary | ICD-10-CM | POA: Diagnosis not present

## 2018-03-08 MED ORDER — PANTOPRAZOLE SODIUM 40 MG PO TBEC: 40.0000 mg | DELAYED_RELEASE_TABLET | Freq: Every day | ORAL | 3 refills | Status: DC

## 2018-03-08 NOTE — Patient Outreach (Addendum)
[facility]) Care Management  03/08/2018  [name] [birth year] [id number]   Subjective:  Telephone call to patient's home  / mobile number, no answer, left HIPAA compliant voicemail message times 2, and requested call back.  Telephone call from patient's home / mobile number, spoke with patient, and HIPAA verified.  Discussed [facility] Care Management [employer] EMMI General Discharge follow up, patient voiced understanding, and is in agreement to follow up.  Patient states he is doing ok, had follow up appointment with primary MD today 03/08/2018 or yesterday 03/07/2018, appointment went well, and has another follow up appointment tomorrow 03/09/2018 or Thursday 03/10/2018 with a different provider, and may need transportation assistance to that appointment.  RNCM advised will refer patient to [place] [facility] Social Worker for transportation assistance, follow up may take up to 10 business days, patient voices understanding, states he will work on obtaining transportation to upcoming follow up appointment (03/09/2018 or 03/10/2018), and will obtain assistance with other future appointments.  Patient in agreement to referral to [place] [facility] Social Worker.   Patient states he is able to manage self care and does not have a designated consistent caregiver.  Patient voices understanding of medical diagnosis and treatment plan.    Patient states he needs a new blood glucose meter and test strips, has not completed the medication assistance program application for insulin, and he will complete at a later time as needed.  RNCM advised patient will follow up to verify if any assistance available for obtaining new glucose meter, strips, with [place], patient in agreement to Pharmacy referral  for assistance with meter, and /or only, not for medication review, or other medication assistance.  Patient states he will follow up with his primary MD or pharmacy  regarding prescription refills as needed, declined [place] referral or assistance at this time.  Patient states he has been taking his medications for a long time, does not need any education on how to take, or any other assistance regarding medications.  Patient states he does not have any education material, EMMI follow up, care coordination, care management, disease monitoring, community resource, or pharmacy needs at this time.  States he is very appreciative of the follow up. Case discussed with Quinn Plowman  RNCM  and with [place] Technician at [facility].  No assistance available for meter and strips, patient will need to contact primary MD's office for assistance and order for appropriate supplies. Patient or providers office may contact insurance company regarding preferred meter and strips as needed.     Objective: Per KPN (Knowledge Performance Now, point of care tool) and chart review,  Patient hospitalized 02/27/2018 -03/05/2018 for Uremia , Sepsis secondary to pneumonia,  Acute metabolic encephalopathy, and Acute kidney injury over CKD (chronic Kidney Disease) stage IV.  Patient also has a history of diabetes, chronic diastolic congestive heart failure, gout, and asthma.    Assessment: Received [employer] EMMI General Discharge follow up referral on 03/08/2018.  Red Alert flag, Day #1, patient answered no to the following question: Transportation to follow-up?  EMMI follow up completed. Will refer patient to [place] [facility] Social Worker for transportation assistance.   RNCM will follow up with patient regarding glucose meter and strips.      Plan: RNCM will refer patient to [place] [facility] Social Worker for transportation assistance. RNCM will call patient within 3 - 4 business to advised patient to follow up  with primary MD regarding new glucose meter and strips, if no return call.      Vieno Tarrant H. Annia Friendly, BSN, [facility] Telephonic CM Phone: [phone] Fax: [phone]

## 2018-03-08 NOTE — Progress Notes (Signed)
Subjective:    Patient ID: [name], male    DOB: January 07, 1966, 53 y.o.   MRN: [id number]  [name] is a 53 y.o. male presenting on 03/08/2018 for Hospitalization Follow-up (pneumonia) and Acute Renal Failure   HPI  HOSPITAL FOLLOW-UP VISIT  Hospital/Location: [facility] Date of Admission: 02/28/18 Date of Discharge: 03/05/18 Transitions of care telephone call: Completed on 03/07/18 by Fabio Neighbors LPN  Reason for Admission: Syncope, Hypotension, Acute Renal Failure Primary (+Secondary) Diagnosis: Acute on Chronic Kidney Disease IV (acute renal failure due to hypotension, dehydration, NSAID), Sepsis secondary to pneumonia, hypotension, hypoxic respiratory failure  - Hospital H&P and Discharge Summary have been reviewed  - Patient presents today 3 days after recent hospitalization. Brief summary of recent course, patient had symptoms of constellation severe weakness, low blood pressure, recently briefly passed out after sneezing, he had recent URI and congestion symptoms prior, dizziness, he was found to have hypotensive episode with BP 70/40 EMS, hospitalized to ICU/Stepdown with Acute Renal Failure IVF rehydration and required CCRT therapy per nephrology, Sepsis with septic shock on pressors, Pneumonia, Hypotension. Nephrology determined AKI was due to dehydration and hypotension and NSAID. He was asked to follow-up within 1-2 weeks of discharge, his creatinine has improved back to 2 range last checked 3 days ago 2/8  - Today reports overall has done well after discharge. Symptoms of cough and shortness of breath have improved. He has non productive occasional cough now.  - No further syncope episode.  - Regarding kidneys, he will schedule with outpatient Dr Juleen China [facility] soon. He says now his urine is clear and making good urine. He had problem with darker urine during time of illness. He is asking about dehydration risk.  - He has checked weight in past 5 days, with about 30  lbs of water weight, he took Metolazone 19m with some improvement. [facility] IV lasix and dosing of metolazone, he will wait to discuss with them. He is asking about closer place for IV lasix.  - Regarding blood sugar, he is using basaglar 10 units but thinks this is not doing as much, in hospital CBG 150-400, he is asking about inc dose and other med.  - New medications on discharge: Augmentin antibiotic BID x 7 days - Changes to current meds on discharge: None  Denies chest pain, dyspnea, worsening swelling, reduced urine output  I have reviewed the discharge medication list, and have reconciled the current and discharge medications today.   Current Outpatient Medications:  .  albuterol (PROVENTIL) (2.5 MG/3ML) 0.083% nebulizer solution, Take 3 mLs (2.5 mg total) by nebulization every 6 (six) hours as needed for wheezing or shortness of breath., Disp: 150 mL, Rfl: 1 .  albuterol (VENTOLIN HFA) 108 (90 Base) MCG/ACT inhaler, Inhale 2 puffs into the lungs every 4 (four) hours as needed for wheezing or shortness of breath., Disp: 18 g, Rfl: 0 .  allopurinol (ZYLOPRIM) 100 MG tablet, Take 1 tablet (100 mg total) by mouth daily., Disp: 90 tablet, Rfl: 1 .  amoxicillin-clavulanate (AUGMENTIN) 875-125 MG tablet, Take 1 tablet by mouth every 12 (twelve) hours for 7 days., Disp: 14 tablet, Rfl: 0 .  aspirin 81 MG chewable tablet, Chew 81 mg by mouth daily., Disp: , Rfl:  .  atorvastatin (LIPITOR) 20 MG tablet, Take 1 tablet (20 mg total) by mouth daily., Disp: 90 tablet, Rfl: 3 .  blood glucose meter kit and supplies KIT, Brand of choice;  LON 99 months; check FSBS 3x a day; E11.65; disp one meter with 100 strips + 1 refill of strips, Disp: 1 each, Rfl: 0 .  carvedilol (COREG) 25 MG tablet, Take 1 tablet (25 mg total) by mouth 2 (two) times daily with a meal. (Patient taking differently: Take 50 mg by mouth 2 (two) times daily with a meal. ), Disp: 60 tablet,  Rfl: 11 .  gabapentin (NEURONTIN) 100 MG capsule, Start 1 capsule daily, increase by 1 cap every 2-3 days as tolerated up to 1-3 capsules per dose 2 times a day, Disp: 60 capsule, Rfl: 1 .  Insulin Glargine (BASAGLAR KWIKPEN) 100 UNIT/ML SOPN, Inject 15 Units into the skin daily. Submitted to [employer] for assistance, Disp: , Rfl:  .  Insulin Pen Needle (B-D UF III MINI PEN NEEDLES) 31G X 5 MM MISC, 1 each by Does not apply route daily. Use once daily with Victoza., Disp: 100 each, Rfl: 12 .  lisinopril (PRINIVIL,ZESTRIL) 2.5 MG tablet, Take 1 tablet (2.5 mg total) by mouth daily., Disp: , Rfl:  .  loratadine (CLARITIN) 10 MG tablet, Take 1 tablet (10 mg total) by mouth daily., Disp: 90 tablet, Rfl: 3 .  metolazone (ZAROXOLYN) 5 MG tablet, Take 5 mg by mouth daily. , Disp: , Rfl:  .  ranitidine (ZANTAC) 150 MG tablet, Take 1 tablet (150 mg total) by mouth daily before breakfast., Disp: 30 tablet, Rfl: 5 .  traMADol (ULTRAM) 50 MG tablet, Take 1-2 tablets (50-100 mg total) by mouth every 12 (twelve) hours as needed., Disp: 30 tablet, Rfl: 2 .  vitamin C (ASCORBIC ACID) 500 MG tablet, Take 1,000 mg by mouth daily., Disp: , Rfl:  .  Zinc 50 MG CAPS, Take 50 mg by mouth daily., Disp: , Rfl:  .  bumetanide (BUMEX) 1 MG tablet, Take 1 mg by mouth daily. , Disp: , Rfl:  .  pantoprazole (PROTONIX) 40 MG tablet, Take 1 tablet (40 mg total) by mouth daily., Disp: 30 tablet, Rfl: 3  ------------------------------------------------------------------------- Social History   Tobacco Use  . Smoking status: Never Smoker  . Smokeless tobacco: Never Used  Substance Use Topics  . Alcohol use: Yes    Comment: a glass of wine and a couple beers a day  . Drug use: No    Review of Systems Per HPI unless specifically indicated above     Objective:    BP 107/76   Pulse 84   Temp 97.9 F (36.6 C)   Resp 16   Ht '5\' 9"'  (1.753 m)   Wt (!) 383 lb 8 oz (174 kg)   SpO2 95% Comment: on 4 liter of oxygen   BMI 56.63 kg/m   Wt Readings from Last 3 Encounters:  03/08/18 (!) 383 lb 8 oz (174 kg)  03/03/18 (!) 393 lb 4.8 oz (178.4 kg)  12/02/17 (!) 357 lb (161.9 kg)    Physical Exam Vitals signs and nursing note reviewed.  Constitutional:      General: He is not in acute distress.    Appearance: He is well-developed. He is not diaphoretic.     Comments: Currently tired but well appearing, comfortable, cooperative, morbid obese  HENT:     Head: Normocephalic and atraumatic.  Eyes:     General:        Right eye: No discharge.        Left eye: No discharge.     Conjunctiva/sclera: Conjunctivae normal.  Neck:     Musculoskeletal: Normal  range of motion and neck supple.     Thyroid: No thyromegaly.  Cardiovascular:     Rate and Rhythm: Normal rate and regular rhythm.     Heart sounds: Normal heart sounds. No murmur.  Pulmonary:     Effort: Pulmonary effort is normal. No respiratory distress.     Breath sounds: Wheezing (scattered lower respiratory wheezes) present. No rales.     Comments: Using supplement O2 2L for comfort by request today Musculoskeletal: Normal range of motion.     Right lower leg: Edema present.     Left lower leg: Edema present.  Lymphadenopathy:     Cervical: No cervical adenopathy.  Skin:    General: Skin is warm and dry.     Findings: No erythema or rash.  Neurological:     Mental Status: He is alert and oriented to person, place, and time.  Psychiatric:        Behavior: Behavior normal.     Comments: Well groomed, good eye contact, normal speech and thoughts        Results for orders placed or performed during the hospital encounter of 02/28/18  Blood culture (routine x 2)  Result Value Ref Range   Specimen Description      BLOOD RIGHT ANTECUBITAL Performed at [facility], [address] [place], [place] [address]    Special Requests      BOTTLES DRAWN AEROBIC AND ANAEROBIC Blood Culture adequate volume Performed at [facility], [address] [place], [place] [address]    Culture  Setup Time      IN BOTH AEROBIC AND ANAEROBIC BOTTLES GRAM NEGATIVE COCCOBACILLI CRITICAL RESULT CALLED TO, READ BACK BY AND VERIFIED WITH: KAREN HAYES AT 2458 ON 03/01/2018 [place]    Culture (A)     HAEMOPHILUS INFLUENZAE BETA [place] NOTIFIED Referred to [facility] in [place], [place] for serotyping. Performed at [facility], [place] [address] [place], [place] [address]    Report Status PENDING   Blood culture (routine x 2)  Result Value Ref Range   Specimen Description BLOOD BLOOD RIGHT HAND    Special Requests      BOTTLES DRAWN AEROBIC AND ANAEROBIC Blood Culture results may not be optimal due to an inadequate volume of blood received in culture bottles   Culture      NO GROWTH 5 DAYS Performed at [facility], [address] [place], [place] [address]    Report Status 03/05/2018 FINAL   MRSA PCR Screening  Result Value Ref Range   MRSA by PCR NEGATIVE NEGATIVE  Culture, blood (single) w Reflex to ID Panel  Result Value Ref Range   Specimen Description BLOOD BLOOD LEFT HAND    Special Requests      BOTTLES DRAWN AEROBIC ONLY Blood Culture results may not be optimal due to an inadequate volume of blood received in culture bottles   Culture      NO GROWTH 5 DAYS Performed at [facility], [address] [place], [place] [address]    Report Status 03/05/2018 FINAL   Respiratory Panel by PCR  Result Value Ref Range   Adenovirus NOT DETECTED NOT DETECTED   Coronavirus 229E NOT DETECTED NOT DETECTED   Coronavirus HKU1 NOT DETECTED NOT DETECTED   Coronavirus NL63 NOT DETECTED NOT DETECTED   Coronavirus OC43 NOT DETECTED NOT DETECTED   Metapneumovirus NOT DETECTED NOT DETECTED   Rhinovirus / Enterovirus NOT DETECTED NOT DETECTED  Influenza A NOT DETECTED NOT DETECTED   Influenza B NOT DETECTED NOT DETECTED   Parainfluenza Virus 1 NOT DETECTED NOT DETECTED    Parainfluenza Virus 2 NOT DETECTED NOT DETECTED   Parainfluenza Virus 3 NOT DETECTED NOT DETECTED   Parainfluenza Virus 4 NOT DETECTED NOT DETECTED   Respiratory Syncytial Virus NOT DETECTED NOT DETECTED   Bordetella pertussis NOT DETECTED NOT DETECTED   Chlamydophila pneumoniae NOT DETECTED NOT DETECTED   Mycoplasma pneumoniae NOT DETECTED NOT DETECTED  Blood Culture ID Panel (Reflexed)  Result Value Ref Range   Enterococcus species NOT DETECTED NOT DETECTED   Listeria monocytogenes NOT DETECTED NOT DETECTED   Staphylococcus species NOT DETECTED NOT DETECTED   Staphylococcus aureus (BCID) NOT DETECTED NOT DETECTED   Streptococcus species NOT DETECTED NOT DETECTED   Streptococcus agalactiae NOT DETECTED NOT DETECTED   Streptococcus pneumoniae NOT DETECTED NOT DETECTED   Streptococcus pyogenes NOT DETECTED NOT DETECTED   Acinetobacter baumannii NOT DETECTED NOT DETECTED   Enterobacteriaceae species NOT DETECTED NOT DETECTED   Enterobacter cloacae complex NOT DETECTED NOT DETECTED   Escherichia coli NOT DETECTED NOT DETECTED   Klebsiella oxytoca NOT DETECTED NOT DETECTED   Klebsiella pneumoniae NOT DETECTED NOT DETECTED   Proteus species NOT DETECTED NOT DETECTED   Serratia marcescens NOT DETECTED NOT DETECTED   Haemophilus influenzae DETECTED (A) NOT DETECTED   Neisseria meningitidis NOT DETECTED NOT DETECTED   Pseudomonas aeruginosa NOT DETECTED NOT DETECTED   Candida albicans NOT DETECTED NOT DETECTED   Candida glabrata NOT DETECTED NOT DETECTED   Candida krusei NOT DETECTED NOT DETECTED   Candida parapsilosis NOT DETECTED NOT DETECTED   Candida tropicalis NOT DETECTED NOT DETECTED  Troponin I - ONCE - STAT  Result Value Ref Range   Troponin I <0.03 <0.03 ng/mL  CBC with Differential/Platelet  Result Value Ref Range   WBC 23.3 (H) 4.0 - 10.5 K/uL   RBC 4.88 4.22 - 5.81 MIL/uL   Hemoglobin 14.8 13.0 - 17.0 g/dL   HCT 45.7 39.0 - 52.0 %   MCV 93.6 80.0 - 100.0 fL   MCH  30.3 26.0 - 34.0 pg   MCHC 32.4 30.0 - 36.0 g/dL   RDW 14.7 11.5 - 15.5 %   Platelets 152 150 - 400 K/uL   nRBC 0.0 0.0 - 0.2 %   Neutrophils Relative % 90 %   Neutro Abs 21.0 (H) 1.7 - 7.7 K/uL   Lymphocytes Relative 0 %   Lymphs Abs 0.0 (L) 0.7 - 4.0 K/uL   Monocytes Relative 7 %   Monocytes Absolute 1.6 (H) 0.1 - 1.0 K/uL   Eosinophils Relative 0 %   Eosinophils Absolute 0.0 0.0 - 0.5 K/uL   Basophils Relative 0 %   Basophils Absolute 0.0 0.0 - 0.1 K/uL   WBC Morphology TOXIC GRANULATION    Smear Review Normal platelet morphology    Metamyelocytes Relative 2 %   Myelocytes 1 %   Abs Immature Granulocytes 0.70 (H) 0.00 - 0.07 K/uL   Tear Drop Cells PRESENT   Comprehensive metabolic panel  Result Value Ref Range   Sodium 128 (L) 135 - 145 mmol/L   Potassium 5.3 (H) 3.5 - 5.1 mmol/L   Chloride 84 (L) 98 - 111 mmol/L   CO2 30 22 - 32 mmol/L   Glucose, Bld 188 (H) 70 - 99 mg/dL   BUN 115 (H) 6 - 20 mg/dL   Creatinine, Ser 7.47 (H) 0.61 - 1.24 mg/dL   Calcium 8.2 (L)  8.9 - 10.3 mg/dL   Total Protein 7.1 6.5 - 8.1 g/dL   Albumin 2.5 (L) 3.5 - 5.0 g/dL   AST 20 15 - 41 U/L   ALT 17 0 - 44 U/L   Alkaline Phosphatase 92 38 - 126 U/L   Total Bilirubin 1.3 (H) 0.3 - 1.2 mg/dL   GFR calc non Af Amer 8 (L) >60 mL/min   GFR calc Af Amer 9 (L) >60 mL/min   Anion gap 14 5 - 15  Ethanol  Result Value Ref Range   Alcohol, Ethyl (B) <10 <10 mg/dL  Lactic acid, plasma  Result Value Ref Range   Lactic Acid, Venous 2.1 (HH) 0.5 - 1.9 mmol/L  Urinalysis, Complete w Microscopic  Result Value Ref Range   Color, Urine AMBER (A) YELLOW   APPearance CLEAR (A) CLEAR   Specific Gravity, Urine 1.018 1.005 - 1.030   pH 5.0 5.0 - 8.0   Glucose, UA NEGATIVE NEGATIVE mg/dL   Hgb urine dipstick NEGATIVE NEGATIVE   Bilirubin Urine NEGATIVE NEGATIVE   Ketones, ur NEGATIVE NEGATIVE mg/dL   Protein, ur NEGATIVE NEGATIVE mg/dL   Nitrite NEGATIVE NEGATIVE   Leukocytes, UA NEGATIVE NEGATIVE   RBC /  HPF 0-5 0 - 5 RBC/hpf   WBC, UA 0-5 0 - 5 WBC/hpf   Bacteria, UA NONE SEEN NONE SEEN   Squamous Epithelial / LPF 0-5 0 - 5   Mucus PRESENT    Hyaline Casts, UA PRESENT   Urine Drug Screen, Qualitative ([facility] only)  Result Value Ref Range   Tricyclic, Ur Screen NONE DETECTED NONE DETECTED   Amphetamines, Ur Screen NONE DETECTED NONE DETECTED   MDMA (Ecstasy)Ur Screen NONE DETECTED NONE DETECTED   Cocaine Metabolite,Ur [place] NONE DETECTED NONE DETECTED   Opiate, Ur Screen NONE DETECTED NONE DETECTED   Phencyclidine (PCP) Ur S NONE DETECTED NONE DETECTED   Cannabinoid 50 Ng, Ur [place] NONE DETECTED NONE DETECTED   Barbiturates, Ur Screen NONE DETECTED NONE DETECTED   Benzodiazepine, Ur Scrn NONE DETECTED NONE DETECTED   Methadone Scn, Ur NONE DETECTED NONE DETECTED  Glucose, capillary  Result Value Ref Range   Glucose-Capillary 199 (H) 70 - 99 mg/dL  Influenza panel by PCR (type A & B)  Result Value Ref Range   Influenza A By PCR NEGATIVE NEGATIVE   Influenza B By PCR NEGATIVE NEGATIVE  Hemoglobin A1c  Result Value Ref Range   Hgb A1c MFr Bld 8.1 (H) 4.8 - 5.6 %   Mean Plasma Glucose 185.77 mg/dL  Strep pneumoniae urinary antigen  Result Value Ref Range   Strep Pneumo Urinary Antigen NEGATIVE NEGATIVE  Glucose, capillary  Result Value Ref Range   Glucose-Capillary 185 (H) 70 - 99 mg/dL  HIV Antibody (routine testing w rflx)  Result Value Ref Range   HIV Screen 4th Generation wRfx Non Reactive Non Reactive  Protime-INR  Result Value Ref Range   Prothrombin Time 15.1 11.4 - 15.2 seconds   INR 9.48   Basic metabolic panel  Result Value Ref Range   Sodium 128 (L) 135 - 145 mmol/L   Potassium 5.9 (H) 3.5 - 5.1 mmol/L   Chloride 89 (L) 98 - 111 mmol/L   CO2 27 22 - 32 mmol/L   Glucose, Bld 294 (H) 70 - 99 mg/dL   BUN 117 (H) 6 - 20 mg/dL   Creatinine, Ser 7.44 (H) 0.61 - 1.24 mg/dL   Calcium 7.5 (L) 8.9 - 10.3 mg/dL   GFR calc non  Af Amer 8 (L) >60 mL/min   GFR calc Af Amer 9 (L)  >60 mL/min   Anion gap 12 5 - 15  CBC  Result Value Ref Range   WBC 20.5 (H) 4.0 - 10.5 K/uL   RBC 4.68 4.22 - 5.81 MIL/uL   Hemoglobin 14.1 13.0 - 17.0 g/dL   HCT 45.0 39.0 - 52.0 %   MCV 96.2 80.0 - 100.0 fL   MCH 30.1 26.0 - 34.0 pg   MCHC 31.3 30.0 - 36.0 g/dL   RDW 14.6 11.5 - 15.5 %   Platelets 144 (L) 150 - 400 K/uL   nRBC 0.1 0.0 - 0.2 %  Procalcitonin - Baseline  Result Value Ref Range   Procalcitonin 20.36 ng/mL  Glucose, capillary  Result Value Ref Range   Glucose-Capillary 178 (H) 70 - 99 mg/dL  Lactic acid, plasma  Result Value Ref Range   Lactic Acid, Venous 1.7 0.5 - 1.9 mmol/L  Glucose, capillary  Result Value Ref Range   Glucose-Capillary 233 (H) 70 - 99 mg/dL  Blood gas, arterial  Result Value Ref Range   FIO2 0.28    Delivery systems NO CHARGE    pH, Arterial 7.22 (L) 7.350 - 7.450   pCO2 arterial 62 (H) 32.0 - 48.0 mmHg   pO2, Arterial 56 (L) 83.0 - 108.0 mmHg   Bicarbonate 25.4 20.0 - 28.0 mmol/L   Acid-base deficit 3.5 (H) 0.0 - 2.0 mmol/L   O2 Saturation 81.9 %   Patient temperature 37.0    Collection site RIGHT RADIAL    Sample type ARTERIAL DRAW    Allens test (pass/fail) PASS PASS  Potassium  Result Value Ref Range   Potassium 5.7 (H) 3.5 - 5.1 mmol/L  Glucose, capillary  Result Value Ref Range   Glucose-Capillary 284 (H) 70 - 99 mg/dL  Glucose, capillary  Result Value Ref Range   Glucose-Capillary 302 (H) 70 - 99 mg/dL  Glucose, capillary  Result Value Ref Range   Glucose-Capillary 310 (H) 70 - 99 mg/dL  Potassium  Result Value Ref Range   Potassium 5.4 (H) 3.5 - 5.1 mmol/L  Glucose, capillary  Result Value Ref Range   Glucose-Capillary 303 (H) 70 - 99 mg/dL  Basic metabolic panel  Result Value Ref Range   Sodium 132 (L) 135 - 145 mmol/L   Potassium 5.1 3.5 - 5.1 mmol/L   Chloride 93 (L) 98 - 111 mmol/L   CO2 27 22 - 32 mmol/L   Glucose, Bld 260 (H) 70 - 99 mg/dL   BUN 128 (H) 6 - 20 mg/dL   Creatinine, Ser 6.33 (H) 0.61 -  1.24 mg/dL   Calcium 7.9 (L) 8.9 - 10.3 mg/dL   GFR calc non Af Amer 9 (L) >60 mL/min   GFR calc Af Amer 11 (L) >60 mL/min   Anion gap 12 5 - 15  CBC  Result Value Ref Range   WBC 12.2 (H) 4.0 - 10.5 K/uL   RBC 4.43 4.22 - 5.81 MIL/uL   Hemoglobin 13.4 13.0 - 17.0 g/dL   HCT 41.8 39.0 - 52.0 %   MCV 94.4 80.0 - 100.0 fL   MCH 30.2 26.0 - 34.0 pg   MCHC 32.1 30.0 - 36.0 g/dL   RDW 14.3 11.5 - 15.5 %   Platelets 147 (L) 150 - 400 K/uL   nRBC 0.3 (H) 0.0 - 0.2 %  Glucose, capillary  Result Value Ref Range   Glucose-Capillary 245 (H) 70 - 99 mg/dL  Glucose, capillary  Result Value Ref Range   Glucose-Capillary 315 (H) 70 - 99 mg/dL  Basic metabolic panel  Result Value Ref Range   Sodium 133 (L) 135 - 145 mmol/L   Potassium 5.0 3.5 - 5.1 mmol/L   Chloride 98 98 - 111 mmol/L   CO2 24 22 - 32 mmol/L   Glucose, Bld 258 (H) 70 - 99 mg/dL   BUN 130 (H) 6 - 20 mg/dL   Creatinine, Ser 4.41 (H) 0.61 - 1.24 mg/dL   Calcium 8.0 (L) 8.9 - 10.3 mg/dL   GFR calc non Af Amer 14 (L) >60 mL/min   GFR calc Af Amer 17 (L) >60 mL/min   Anion gap 11 5 - 15  Glucose, capillary  Result Value Ref Range   Glucose-Capillary 448 (H) 70 - 99 mg/dL  Glucose, capillary  Result Value Ref Range   Glucose-Capillary 316 (H) 70 - 99 mg/dL  Glucose, capillary  Result Value Ref Range   Glucose-Capillary 259 (H) 70 - 99 mg/dL  Glucose, capillary  Result Value Ref Range   Glucose-Capillary 461 (H) 70 - 99 mg/dL  Glucose, capillary  Result Value Ref Range   Glucose-Capillary 320 (H) 70 - 99 mg/dL  Glucose, capillary  Result Value Ref Range   Glucose-Capillary 336 (H) 70 - 99 mg/dL  Basic metabolic panel  Result Value Ref Range   Sodium 134 (L) 135 - 145 mmol/L   Potassium 5.6 (H) 3.5 - 5.1 mmol/L   Chloride 100 98 - 111 mmol/L   CO2 25 22 - 32 mmol/L   Glucose, Bld 414 (H) 70 - 99 mg/dL   BUN 120 (H) 6 - 20 mg/dL   Creatinine, Ser 3.07 (H) 0.61 - 1.24 mg/dL   Calcium 8.5 (L) 8.9 - 10.3 mg/dL    GFR calc non Af Amer 22 (L) >60 mL/min   GFR calc Af Amer 26 (L) >60 mL/min   Anion gap 9 5 - 15  Glucose, capillary  Result Value Ref Range   Glucose-Capillary 477 (H) 70 - 99 mg/dL  Glucose, capillary  Result Value Ref Range   Glucose-Capillary 413 (H) 70 - 99 mg/dL  Glucose, capillary  Result Value Ref Range   Glucose-Capillary 352 (H) 70 - 99 mg/dL  Glucose, capillary  Result Value Ref Range   Glucose-Capillary 399 (H) 70 - 99 mg/dL  Glucose, capillary  Result Value Ref Range   Glucose-Capillary 374 (H) 70 - 99 mg/dL  Glucose, capillary  Result Value Ref Range   Glucose-Capillary 259 (H) 70 - 99 mg/dL  Basic metabolic panel  Result Value Ref Range   Sodium 139 135 - 145 mmol/L   Potassium 5.3 (H) 3.5 - 5.1 mmol/L   Chloride 104 98 - 111 mmol/L   CO2 28 22 - 32 mmol/L   Glucose, Bld 185 (H) 70 - 99 mg/dL   BUN 105 (H) 6 - 20 mg/dL   Creatinine, Ser 2.21 (H) 0.61 - 1.24 mg/dL   Calcium 8.6 (L) 8.9 - 10.3 mg/dL   GFR calc non Af Amer 33 (L) >60 mL/min   GFR calc Af Amer 39 (L) >60 mL/min   Anion gap 7 5 - 15  Glucose, capillary  Result Value Ref Range   Glucose-Capillary 129 (H) 70 - 99 mg/dL  Glucose, capillary  Result Value Ref Range   Glucose-Capillary 151 (H) 70 - 99 mg/dL  Glucose, capillary  Result Value Ref Range   Glucose-Capillary 230 (H) 70 - 99 mg/dL  ECHOCARDIOGRAM COMPLETE  Result Value Ref Range   Weight 6,155.24 oz   Height 71 in   BP 99/76 mmHg  Type and screen  Result Value Ref Range   ABO/RH(D) A POS    Antibody Screen NEG    Sample Expiration      03/03/2018 Performed at [facility], [address] [place], [place] [address]       Assessment & Plan:   Problem List Items Addressed This Visit    CKD (chronic kidney disease), stage IV ([place])   GERD (gastroesophageal reflux disease)   Relevant Medications   pantoprazole (PROTONIX) 40 MG tablet   Morbid obesity with BMI of 50.0-59.9, adult ([place])   Type 2 diabetes mellitus,  controlled, with renal complications ([place])    Other Visit Diagnoses    Acute renal failure superimposed on stage 4 chronic kidney disease, unspecified acute renal failure type ([place])    -  Primary   Multifocal pneumonia          Sepsis / septic shock - RESOLVED  Hypotension - RESOLVED  Pneumonia, bibasilar multifocal - IMPROVED, now on oral antibiotics - finish augmentin  AoCKD-IV - improved renal function near baseline now Cr 2 as of 3 days ago, will continue current hydration / diuretic regimen and return to [facility] outpatient [facility] in 1-2 weeks for further eval and lab test  DM - elevated A1c, uncontrolled, hyperglycemia complication also with CKD - INCREASE dose basaglar insulin up from 10 up to 15u daily - use existing rx  GERD - start higher dose PPI Pantoprazole 57m daily new rx sent.  Acute on chronic CHF - hypervolemia, with diuretic therapy in hospital, [facility], has been adjusted on diuretic, will follow-up soon with them consider IV lasix therapy vs adjust metolazone, advised him that he needs to discuss dosing regimen with them, due to complexity and recent hospitalization, his wt was >30 lbs up, now improving back on meds and normal hydration.  Meds ordered this encounter  Medications  . pantoprazole (PROTONIX) 40 MG tablet    Sig: Take 1 tablet (40 mg total) by mouth daily.    Dispense:  30 tablet    Refill:  3    Follow up plan: Return in about 3 months (around 06/06/2018) for DM A1c.  ANobie Putnam [email or web address] 03/08/2018, 10:02 AM

## 2018-03-08 NOTE — Telephone Encounter (Signed)
Acknowledged. Patient seen today.  [name], [facility] 03/08/2018, 11:33 AM

## 2018-03-08 NOTE — Patient Instructions (Addendum)
Thank you for coming to the office today.  Increase Basaglar up to 15 units every day, ask Kidney doctor about safety of other diabetic medications with kidney function.  Start Pantoprazole 40mg  daily BEFORE breakfast on empty stomach every day for stomach acid.  Finish antibiotic  Schedule with kidney doctor for follow-up.  Ask Cardiology about future fluid pill.  Your provider would like to you have your annual eye exam. Please contact your current eye doctor or here are some good options for you to contact.   [facility]   Address: [address] [place], [place] [address] Phone: [phone]  Website: [email or web address] [address], [place], [place] [address] Phone: [phone] [email or web address]  [facility]  Address: [place], [place], [place] [address] Phone: [phone]   [facility] [address] [place], [place] [address] Phone: [phone]  [facility] Address: [place], [place], [place] [address]  Phone: [phone]   Please schedule a Follow-up Appointment to: Return in about 3 months (around 06/06/2018) for DM A1c.  If you have any other questions or concerns, please feel free to call the office or send a message through [place]. You may also schedule an earlier appointment if necessary.  Additionally, you may be receiving a survey about your experience at our office within a few days to 1 week by e-mail or mail. We value your feedback.  [name], DO [place]

## 2018-03-09 ENCOUNTER — Inpatient Hospital Stay: Payer: PPO | Admitting: Family Medicine

## 2018-03-09 DIAGNOSIS — N183 Chronic kidney disease, stage 3 (moderate): Secondary | ICD-10-CM | POA: Diagnosis not present

## 2018-03-09 DIAGNOSIS — Z79899 Other long term (current) drug therapy: Secondary | ICD-10-CM | POA: Diagnosis not present

## 2018-03-09 DIAGNOSIS — I110 Hypertensive heart disease with heart failure: Secondary | ICD-10-CM | POA: Diagnosis not present

## 2018-03-09 DIAGNOSIS — I5022 Chronic systolic (congestive) heart failure: Secondary | ICD-10-CM | POA: Diagnosis not present

## 2018-03-09 DIAGNOSIS — Z6841 Body Mass Index (BMI) 40.0 and over, adult: Secondary | ICD-10-CM | POA: Diagnosis not present

## 2018-03-09 DIAGNOSIS — I5023 Acute on chronic systolic (congestive) heart failure: Secondary | ICD-10-CM | POA: Diagnosis not present

## 2018-03-09 DIAGNOSIS — Z7982 Long term (current) use of aspirin: Secondary | ICD-10-CM | POA: Diagnosis not present

## 2018-03-10 ENCOUNTER — Other Ambulatory Visit: Payer: Self-pay | Admitting: *Deleted

## 2018-03-10 NOTE — Patient Outreach (Addendum)
[place] [facility]) Care Management  03/10/2018  [name] 1965/07/23 [id number]   Subjective: Telephone call to patient's home  / mobile number, no answer, left HIPAA compliant voicemail message, and requested call back.   Objective: Per KPN (Knowledge Performance Now, point of care tool) and chart review,  Patient hospitalized 02/27/2018 -03/05/2018 for Uremia , Sepsis secondary to pneumonia,  Acute metabolic encephalopathy, and Acute kidney injury over CKD (chronic Kidney Disease) stage IV.  Patient also has a history of diabetes, chronic diastolic congestive heart failure, gout, and asthma.    Assessment: Received [employer] EMMI General Discharge follow up referral on 03/08/2018.  Red Alert flag, Day #1, patient answered no to the following question: Transportation to follow-up?  EMMI follow up completed. Will refer patient to [place] Management Social Worker for transportation assistance.   RNCM will follow up with patient regarding glucose meter and strips.      Plan: RNCM has referred patient to [place] Management Social Worker for transportation assistance. RNCM will call patient within 3 - 4 business for 2nd attempt call, to advised patient to follow up with primary MD regarding new glucose meter and strips, if no return call.      [name] H. Annia Friendly, BSN, [place] Management [facility] Telephonic CM Phone: [phone] Fax: [phone]

## 2018-03-10 NOTE — Patient Outreach (Signed)
[place] [facility]) Care Management  03/10/2018  BISHOY CUPP 10-27-1965 [id number]   Phone call to patient to discuss transportation options to appointments in [facility]. Per patient, he has a car but fears falling asleep on the road following any procedure he may have. He further discussed that his family was unreliable as he would often have to pay up to $40.00 to drive him to appointments out of town.  This Education officer, museum explored transportation options, discussed the possibility of having someone to drive with him when having to travel to [facility] for safety. This Education officer, museum also discussed PART transportation and provided the contact number to assist with routes and schedules [phone]. Patient was not as receptive to using the PART bus, but did take the contact information.  Transportation resources provided. This Education officer, museum to sign off at this time. Patient encouraged to call this social worker if there are any questions or concerns in the future.    Sheralyn [facility] Care Management [phone]

## 2018-03-11 ENCOUNTER — Other Ambulatory Visit: Payer: Self-pay | Admitting: Family Medicine

## 2018-03-11 ENCOUNTER — Other Ambulatory Visit: Payer: Self-pay | Admitting: *Deleted

## 2018-03-11 ENCOUNTER — Other Ambulatory Visit: Payer: Self-pay

## 2018-03-11 DIAGNOSIS — J4530 Mild persistent asthma, uncomplicated: Secondary | ICD-10-CM

## 2018-03-11 DIAGNOSIS — R0602 Shortness of breath: Secondary | ICD-10-CM

## 2018-03-11 MED ORDER — ALBUTEROL SULFATE (2.5 MG/3ML) 0.083% IN NEBU: 2.5000 mg | INHALATION_SOLUTION | Freq: Four times a day (QID) | RESPIRATORY_TRACT | 1 refills | Status: DC | PRN

## 2018-03-11 NOTE — Patient Outreach (Addendum)
[place] [facility]) Care Management  03/11/2018  [name] Aug 22, 1965 [id number]   Subjective: Telephone call to patient's home / mobile number, spoke with patient, and HIPAA verified.  Discussed [facility] Care Management [employer]age EMMI General Discharge follow up, patient voiced understanding, and is in agreement to follow up.  Patient states he is doing okay and remembers speaking with this RNCM in the past. RNCM advised patient to follow up primary MD's office to obtain order for new glucose monitor / meter, testing strips, he or MD's office can verify with insurance company which monitor will be covered with his plan, patient voices understanding, and states he will follow up.   Discussed Advanced Directives, patient voices understanding, and is in agreement to receive [facility] Care Management Advanced Directive packet.  Patient is also aware that he may obtain Advanced Directive information from primary MD's office.  Patient states he does not have any education material, EMMI follow up, care coordination, care management, disease monitoring, community resource, or pharmacy needs at this time. States he is very appreciative of the follow up and is in agreement to receive [place] Management information.    Objective:Per KPN (Knowledge Performance Now, point of care tool) and chart review,Patient hospitalized2/02/2018 -03/05/2018 forUremia,Sepsis secondary to pneumonia, Acute metabolic encephalopathy, andAcute kidney injury over CKD(chronic Kidney Disease)stage IV. Patient also has a history of diabetes, chronic diastolic congestive heart failure, gout, and asthma.    Assessment: Received [employer] EMMI General Discharge follow up referral on 03/08/2018. Red Alert flag, Day #1, patient answered no to the following question: Transportation to follow-up?EMMI follow up completed. Has referred patient to [place] Management Social Worker for transportation  assistance. Glucose monitor /meter, testing strips, and Advanced Directives follow up completed.       Plan: RNCM will send patient successful outreach letter, [facility] pamphlet, Advanced Directives packet, and magnet. RNCM has referred patient to [place] Management Social Worker for transportation assistance.      [name] H. [name], BSN, [place] Management [facility] Telephonic CM Phone: [phone] Fax: [phone]

## 2018-03-16 DIAGNOSIS — I50813 Acute on chronic right heart failure: Secondary | ICD-10-CM | POA: Diagnosis not present

## 2018-03-28 LAB — CULTURE, BLOOD (ROUTINE X 2): Special Requests: ADEQUATE

## 2018-03-29 ENCOUNTER — Other Ambulatory Visit: Payer: Self-pay | Admitting: Family Medicine

## 2018-03-29 DIAGNOSIS — E1169 Type 2 diabetes mellitus with other specified complication: Secondary | ICD-10-CM

## 2018-03-29 DIAGNOSIS — E785 Hyperlipidemia, unspecified: Principal | ICD-10-CM

## 2018-04-01 ENCOUNTER — Ambulatory Visit: Payer: PPO | Admitting: Family Medicine

## 2018-04-06 DIAGNOSIS — E1122 Type 2 diabetes mellitus with diabetic chronic kidney disease: Secondary | ICD-10-CM | POA: Diagnosis not present

## 2018-04-06 DIAGNOSIS — E875 Hyperkalemia: Secondary | ICD-10-CM | POA: Diagnosis not present

## 2018-04-06 DIAGNOSIS — R809 Proteinuria, unspecified: Secondary | ICD-10-CM | POA: Diagnosis not present

## 2018-04-06 DIAGNOSIS — N183 Chronic kidney disease, stage 3 (moderate): Secondary | ICD-10-CM | POA: Diagnosis not present

## 2018-04-06 DIAGNOSIS — N179 Acute kidney failure, unspecified: Secondary | ICD-10-CM | POA: Diagnosis not present

## 2018-04-06 DIAGNOSIS — I129 Hypertensive chronic kidney disease with stage 1 through stage 4 chronic kidney disease, or unspecified chronic kidney disease: Secondary | ICD-10-CM | POA: Diagnosis not present

## 2018-04-27 ENCOUNTER — Other Ambulatory Visit: Payer: Self-pay | Admitting: Family Medicine

## 2018-04-27 DIAGNOSIS — I10 Essential (primary) hypertension: Secondary | ICD-10-CM

## 2018-04-29 ENCOUNTER — Other Ambulatory Visit: Payer: Self-pay | Admitting: Family Medicine

## 2018-04-29 DIAGNOSIS — E1169 Type 2 diabetes mellitus with other specified complication: Secondary | ICD-10-CM

## 2018-04-29 DIAGNOSIS — E785 Hyperlipidemia, unspecified: Principal | ICD-10-CM

## 2018-05-02 ENCOUNTER — Other Ambulatory Visit: Payer: Self-pay | Admitting: Pharmacy Technician

## 2018-05-02 NOTE — Patient Outreach (Signed)
[place] [facility]) Care Management  05/02/2018  [name] Oct 02, 1965 [id number]  Incoming call received from patient in regards to [place] application for [employer].  Spoke to patient, HIPAA identifiers verified. Patient informed that when he went to pick up his medication today that it was going to cost him $90. He informed that the remembers getting an application from [place] in which he could get his medication free but does not know what he did with them and has requested that we send them out again. Informed patient that his case was closed out due to being non compliant with mailing the forms back to [facility].  Will mail out another application to the patient and will reopen the case when we received the forms back.  [name] P. [name], [place] Management [phone]

## 2018-05-09 ENCOUNTER — Other Ambulatory Visit: Payer: Self-pay | Admitting: Pharmacy Technician

## 2018-05-09 ENCOUNTER — Ambulatory Visit: Payer: Self-pay | Admitting: Pharmacist

## 2018-05-09 ENCOUNTER — Telehealth: Payer: Self-pay | Admitting: Family Medicine

## 2018-05-09 DIAGNOSIS — M159 Polyosteoarthritis, unspecified: Secondary | ICD-10-CM

## 2018-05-09 DIAGNOSIS — E1165 Type 2 diabetes mellitus with hyperglycemia: Principal | ICD-10-CM

## 2018-05-09 DIAGNOSIS — M150 Primary generalized (osteo)arthritis: Principal | ICD-10-CM

## 2018-05-09 DIAGNOSIS — M1A39X0 Chronic gout due to renal impairment, multiple sites, without tophus (tophi): Secondary | ICD-10-CM

## 2018-05-09 DIAGNOSIS — IMO0002 Reserved for concepts with insufficient information to code with codable children: Secondary | ICD-10-CM

## 2018-05-09 DIAGNOSIS — E1122 Type 2 diabetes mellitus with diabetic chronic kidney disease: Secondary | ICD-10-CM

## 2018-05-09 DIAGNOSIS — Z794 Long term (current) use of insulin: Principal | ICD-10-CM

## 2018-05-09 DIAGNOSIS — N184 Chronic kidney disease, stage 4 (severe): Principal | ICD-10-CM

## 2018-05-09 MED ORDER — GABAPENTIN 100 MG PO CAPS: 200.0000 mg | ORAL_CAPSULE | Freq: Two times a day (BID) | ORAL | 2 refills | Status: DC

## 2018-05-09 NOTE — Telephone Encounter (Signed)
Patient advised as per Dr. Raliegh Ip for [place] usage, but for his neuropathy gabapentin is not working and he wanted to increase one more tablet advised patient to confirm with PCP before any changes and will call him back with Dr. Raliegh Ip reply.

## 2018-05-09 NOTE — Chronic Care Management (AMB) (Signed)
Chronic Care Management   Note  05/09/2018 Name: [name] MRN: [id number] DOB: 1965/08/13   Subjective:   [name] is a 53 y.o. year old male who is a primary care patient of Darren Hauser, DO. Receive an Google from [facility] CPhT [name], who is currently assisting patient with completing paperwork for the [employer] patient assistance program, asking me to reach out to patient as he reports today that he is almost out of his insulin and does not have a way to make a copy of his income statement in order to mail her back a copy for his assistance program application.  Note that per patient chart, patient has reached out to the office this morning regarding his need for insulin and spoken with his PCP.  I reached out to [name] by phone today.   [name] was given information about Chronic Care Management services today including:  1. CCM service includes personalized support from designated clinical staff supervised by his physician, including individualized plan of care and coordination with other care providers 2. 24/7 contact phone numbers for assistance for urgent and routine care needs. 3. Service will only be billed when office clinical staff spend 20 minutes or more in a month to coordinate care. 4. Only one practitioner may furnish and Fitzgerald the service in a calendar month. 5. The patient may stop CCM services at any time (effective at the end of the month) by phone call to the office staff. 6. The patient will be responsible for cost sharing (co-pay) of up to 20% of the service fee (after annual deductible is met).  Patient agreed to services and verbal consent obtained.    Assessment:   Goals Addressed            This Visit's Progress   . "I cannot afford my Lantus" (pt-stated)       Current Barriers:  . Financial Barriers - Patient reports that he is unable to afford his insulin.   Pharmacist Clinical Goal(s):  [name] Kitchen Over  the next 30 days, patient will work with Pharmacist to address needs related to medication assistance  . Over the next 30 days, patient will work with LCSW to address addtional needs/discuss resources related to financial barriers  Interventions: . Collaboration with medical office staff to arrange for patient to bring in patient assistance application and supporting documents tomorrow and have these faxed to [place] o Also confirm that patient was able to receive samples of Basaglar from the office today. . Collaboration with [facility] CPhT to assist patient with patient assistance application for Basaglar insulin . Provide patient with instructional video for Basaglar administration and follow up with patient after he has viewed it to address his questions. o Patient verbalizes understanding of video and denies further questions at this time. . Discussed plans with patient for ongoing care management follow up and provided patient with direct contact information for care management team  Patient Self Care Activities:  . Patient to continue to work with Pharmacist and [facility] CPhT to complete Basaglar patient assistance application. . Self administers medications as prescribed . Attends all scheduled provider appointments . Calls pharmacy for medication refills  Initial goal documentation        Plan:  1) Patient to complete Basaglar patient assistance application and bring this application and supporting document to office tomorrow morning to be faxed to [place]. 2) CCM Social Worker to reach out to the patient over  the next 10 days.  3) Telephone follow up appointment with CCM Pharmacist scheduled for: 05/10/2018 at 3 pm to complete medication review.  [name], PharmD, [place] [employer] [phone]

## 2018-05-09 NOTE — Patient Instructions (Signed)
Thank you allowing the Chronic Care Management Team to be a part of your care! It was a pleasure speaking with you today!     CCM (Chronic Care Management) Team    Janci Minor RN, BSN Nurse Care Coordinator  [phone]   [name] PharmD  Clinical Pharmacist  [phone]   [name] LCSW Clinical Social Worker [phone]  Visit Information  Goals Addressed            This Visit's Progress   . "I cannot afford my Lantus" (pt-stated)       Current Barriers:  . Financial Barriers - Patient reports that he is unable to afford his insulin.   Pharmacist Clinical Goal(s):  [name] Kitchen Over the next 30 days, patient will work with Pharmacist to address needs related to medication assistance  . Over the next 30 days, patient will work with LCSW to address addtional needs/discuss resources related to financial barriers  Interventions: . Collaboration with medical office staff to arrange for patient to bring in patient assistance application and supporting documents and have these faxed to [place] o Also confirm that patient was able to receive samples of Basaglar from the office today. . Collaboration with [facility] CPhT to assist patient with patient assistance application for Basaglar insulin . Provide patient with instructional video for Basaglar administration and follow up with patient after he has viewed it to address his questions. o Patient verbalizes understanding of video and denies further questions at this time. . Discussed plans with patient for ongoing care management follow up and provided patient with direct contact information for care management team  Patient Self Care Activities:  . Patient to continue to work with Pharmacist and [facility] CPhT to complete Basaglar patient assistance application. . Self administers medications as prescribed . Attends all scheduled provider appointments . Calls pharmacy for medication refills  Initial goal documentation         [name] was given information about Chronic Care Management services today including:  1. CCM service includes personalized support from designated clinical staff supervised by his physician, including individualized plan of care and coordination with other care providers 2. 24/7 contact phone numbers for assistance for urgent and routine care needs. 3. Service will only be billed when office clinical staff spend 20 minutes or more in a month to coordinate care. 4. Only one practitioner may furnish and bill the service in a calendar month. 5. The patient may stop CCM services at any time (effective at the end of the month) by phone call to the office staff. 6. The patient will be responsible for cost sharing (co-pay) of up to 20% of the service fee (after annual deductible is met).  Patient agreed to services and verbal consent obtained.   The patient verbalized understanding of instructions provided today and declined a print copy of patient instruction materials.   Telephone follow up appointment with CCM Pharmacist scheduled for: 05/10/18  [name], PharmD, [facility] [phone]

## 2018-05-09 NOTE — Telephone Encounter (Signed)
Pt. Have requested that you call him he have a question about his medication.

## 2018-05-09 NOTE — Patient Outreach (Signed)
[place] [facility]) Care Management  05/09/2018  JOSUE FALCONI September 17, 1965 [id number]    Incoming call received from patient in regards to [place] application for [employer].  Patient called to inform that he had received the applicaiton I mailed him for Basaglar. The first application that was sent to him was lost.  However, patient is concerned about the timing of the process as he is almost out of Lantus and does not have the 90 dollars to purchase it. Advised patient to reach out to his provider's office for samples while he fills out the paper work and mails it back.  Will outreach [facility] RPh Denyse Amass with other options to advise patient about.  Akhilesh Sassone P. Adlai Sinning, [place] Management [phone]

## 2018-05-09 NOTE — Telephone Encounter (Signed)
Called patient, he was taking Gabapentin 100mg  BID, I advised him that it is safe to increase further, as originally prescribed, he needs new rx, increase now for his neuropathy symptoms up to 100mg  capsules - take TWO per dose = 200mg , twice daily.  If needed - he can notify us - and Next dose would increase up to 3 twice a day = 300mg  twice a day = total daily dose 600mg , which is max dosage safe for his kidney function.  [name], [employer] [place] [facility] 05/09/2018, 11:29 AM

## 2018-05-10 ENCOUNTER — Other Ambulatory Visit: Payer: Self-pay | Admitting: Pharmacy Technician

## 2018-05-10 ENCOUNTER — Other Ambulatory Visit: Payer: Self-pay | Admitting: *Deleted

## 2018-05-10 ENCOUNTER — Ambulatory Visit: Payer: Self-pay | Admitting: Pharmacist

## 2018-05-10 DIAGNOSIS — E1165 Type 2 diabetes mellitus with hyperglycemia: Principal | ICD-10-CM

## 2018-05-10 DIAGNOSIS — IMO0002 Reserved for concepts with insufficient information to code with codable children: Secondary | ICD-10-CM

## 2018-05-10 DIAGNOSIS — I5022 Chronic systolic (congestive) heart failure: Secondary | ICD-10-CM

## 2018-05-10 DIAGNOSIS — Z794 Long term (current) use of insulin: Principal | ICD-10-CM

## 2018-05-10 DIAGNOSIS — E1122 Type 2 diabetes mellitus with diabetic chronic kidney disease: Secondary | ICD-10-CM

## 2018-05-10 DIAGNOSIS — K219 Gastro-esophageal reflux disease without esophagitis: Secondary | ICD-10-CM

## 2018-05-10 DIAGNOSIS — M1A39X0 Chronic gout due to renal impairment, multiple sites, without tophus (tophi): Secondary | ICD-10-CM

## 2018-05-10 DIAGNOSIS — N184 Chronic kidney disease, stage 4 (severe): Principal | ICD-10-CM

## 2018-05-10 NOTE — Patient Outreach (Signed)
[place] [facility]) Care Management  05/10/2018  [name] 1965/04/16 [id number]   Per chart review patient currently active with [name]. Received voicemail message from [place], requesting call back.  Telephone call to patient's home  / mobile number, no answer, left HIPAA compliant voicemail message, and requested call back.    Left HIPAA compliant voicemail for [facility] Clinical Pharmacist, at [place], and requested call back. RNCM will call patient for 2nd telephone outreach attempt within 4 business days, voicemail follow up, and proceed with case closure, within 10 business days if no return call.     Markice Torbert H. Annia Friendly, BSN, [place] Management [facility] Telephonic CM Phone: [phone] Fax: [phone]

## 2018-05-10 NOTE — Patient Outreach (Signed)
[place] [facility]) Care Management  05/10/2018  [name] 30-Jan-1965 [id number]   Received telephone call from [name] at [place], discussed past conversations with patient.    Grayland Ormond with follow up with patient today, no need for this RNCM to follow up with patient, and patient in agreement to Chronic Care Management services.    Allyn Bartelson H. Annia Friendly, BSN, [place] Management [facility] Telephonic CM Phone: [phone] Fax: [phone]

## 2018-05-10 NOTE — Patient Outreach (Signed)
[place] [facility]) Care Management  05/10/2018  HUEY SCALIA 1965-08-18 [id number]    Received all necessary documents and signatures from both patient and provider for [facility] application for [place].  Submitted completed application via fax to [employer].  Will followup with [employer] in 3-5 business days to inquire on status of application.  Betrice Wanat P. Shalissa [place], [place] Management [phone]

## 2018-05-11 ENCOUNTER — Telehealth: Payer: Self-pay | Admitting: Family Medicine

## 2018-05-11 ENCOUNTER — Ambulatory Visit: Payer: Self-pay | Admitting: *Deleted

## 2018-05-11 DIAGNOSIS — J4520 Mild intermittent asthma, uncomplicated: Secondary | ICD-10-CM

## 2018-05-11 DIAGNOSIS — K219 Gastro-esophageal reflux disease without esophagitis: Secondary | ICD-10-CM

## 2018-05-11 MED ORDER — ESOMEPRAZOLE MAGNESIUM 40 MG PO CPDR: 40.0000 mg | DELAYED_RELEASE_CAPSULE | Freq: Every day | ORAL | 1 refills | Status: DC

## 2018-05-11 MED ORDER — ALBUTEROL SULFATE HFA 108 (90 BASE) MCG/ACT IN AERS: 2.0000 | INHALATION_SPRAY | RESPIRATORY_TRACT | 2 refills | Status: DC | PRN

## 2018-05-11 NOTE — Telephone Encounter (Signed)
See documentation from [place], [facility]  - Requested switch from Omeprazole and Pantoprazole over to Esomeprazole, preferred option for his symptom control, will send new rx and DC others  - Refill Albuterol inhaler  - If he needs H2 blocker we will order Famotidine 20-40mg  nightly PRN  [name], DO [place] [facility] 05/11/2018, 12:02 PM

## 2018-05-11 NOTE — Chronic Care Management (AMB) (Signed)
Chronic Care Management   Note  05/11/2018 Name: [name] MRN: [id number] DOB: 03/30/1965   Subjective:   [name] is a 53 y.o. year old male who is a primary care patient of [name], DO. The CM team was consulted for assistance with chronic disease management and care coordination.   I reached out to [name] by phone today as scheduled to complete a medication review.  Review of patient status, including review of consultants reports, laboratory and other test data, was performed as part of comprehensive evaluation and provision of chronic care management services.   Objective: Lab Results  Component Value Date   CREATININE 2.21 (H) 03/05/2018   CREATININE 3.07 (H) 03/04/2018   CREATININE 4.41 (H) 03/03/2018    Lab Results  Component Value Date   HGBA1C 8.1 (H) 02/28/2018    Lipid Panel     Component Value Date/Time   CHOL 115 10/13/2017 0802   CHOL 105 01/17/2015 1120   CHOL 110 08/07/2014 0848   TRIG 208 (H) 10/13/2017 0802   TRIG 159 (H) 08/07/2014 0848   HDL 49 10/13/2017 0802   HDL 48 01/17/2015 1120   CHOLHDL 2.3 10/13/2017 0802   VLDL 11 08/06/2015 0937   VLDL 32 (H) 08/07/2014 0848   LDLCALC 39 10/13/2017 0802    BP Readings from Last 3 Encounters:  03/08/18 107/76  03/05/18 134/87  12/02/17 136/65    No Known Allergies  Medications Reviewed Today    Reviewed by [name], RPH (Pharmacist) on 05/10/18 at 37  Med List Status: <None>  Medication Order Taking? Sig Documenting Provider Last Dose Status Informant  albuterol (PROVENTIL) (2.5 MG/3ML) 0.083% nebulizer solution [id number] Yes Take 3 mLs (2.5 mg total) by nebulization every 6 (six) hours as needed for wheezing or shortness of breath. [name], DO Taking Active   albuterol (VENTOLIN HFA) 108 (90 Base) MCG/ACT inhaler [id number] No Inhale 2 puffs into the lungs every 4 (four) hours as needed for wheezing or shortness of breath.   Patient not taking:  Reported on 05/10/2018   Arnetha Courser, MD Not Taking Active Self           Med Note [name]   Tue Mar 08, 2018 11:57 AM) Need a refill and patient will call pharmacy to request.   allopurinol (ZYLOPRIM) 100 MG tablet [id number] Yes Take 200 mg by mouth daily. [provider] Taking Active   aspirin 81 MG chewable tablet [id number] Yes Chew 81 mg by mouth daily. [provider] Taking Active Self  atorvastatin (LIPITOR) 20 MG tablet [id number] Yes Take 1 tablet (20 mg total) by mouth daily. [name], DO Taking Active   blood glucose meter kit and supplies KIT [id number]  Brand of choice; LON 99 months; check FSBS 3x a day; E11.65; disp one meter with 100 strips + 1 refill of strips [name], [name], MD  Active Self  bumetanide (BUMEX) 1 MG tablet [id number] Yes Take 1 mg by mouth daily. States 2 tablets twice a day [provider] Taking Active Self           Med Note [name]   Tue Mar 08, 2018 12:14 PM) Patient states primary MD aware he is taking.  carvedilol (COREG) 25 MG tablet [id number] Yes Take 2 tablets (50 mg total) by mouth 2 (two) times daily with a meal. [name], [name], DO Taking Active   gabapentin (NEURONTIN) 100 MG  capsule [id number] Yes Take 2 capsules (200 mg total) by mouth 2 (two) times daily. [name], DO Taking Active   Insulin Glargine [facility]) 100 UNIT/ML SOPN [id number] No Inject 15 Units into the skin daily. Submitted to [employer] for assistance [name], DO Not Taking Active Self           Med Note [facility], [name] A   Mon May 09, 2018  3:27 PM) Injects in the morning  Insulin Pen Needle (B-D UF III MINI PEN NEEDLES) 31G X 5 MM MISC [id number]  1 each by Does not apply route daily. Use once daily with Victoza. [name], DO  Active Self  lisinopril (PRINIVIL,ZESTRIL) 2.5 MG tablet [id number] Yes Take 1 tablet (2.5 mg total) by  mouth daily. [name], DO Taking Active Self  loratadine (CLARITIN) 10 MG tablet [id number] Yes Take 1 tablet (10 mg total) by mouth daily. [name], DO Taking Active Self  magnesium oxide (MAG-OX) 400 MG tablet [id number] Yes Take 1 tablet by mouth daily. [provider] Taking Active   metolazone (ZAROXOLYN) 5 MG tablet [id number] Yes Take 5 mg by mouth daily. as needed (for weight gain, as directed by your provider). [provider] Taking Active Self           Med Note Luan Pulling, [facility]   Mon Feb 28, 2018  9:49 AM)    omeprazole (PRILOSEC) 40 MG capsule [id number] Yes Take 1 capsule by mouth daily. [provider] Taking Active   pantoprazole (PROTONIX) 40 MG tablet [id number] Yes Take 1 tablet (40 mg total) by mouth daily. [name], DO Taking Active            Med Note [facility], [name] A   Tue May 10, 2018  3:20 PM)    ranitidine (ZANTAC) 150 MG tablet [id number] No Take 1 tablet (150 mg total) by mouth daily before breakfast.  Patient not taking:  Reported on 05/10/2018   [name], DO Not Taking Active Self  simethicone (MYLICON) 277 MG chewable tablet [id number] Yes Chew 125 mg by mouth every 6 (six) hours as needed for flatulence. [provider] Taking Active   traMADol (ULTRAM) 50 MG tablet [id number] Yes Take 1-2 tablets (50-100 mg total) by mouth every 12 (twelve) hours as needed. [name], DO Taking Active   vitamin C (ASCORBIC ACID) 500 MG tablet [id number] Yes Take 1,000 mg by mouth daily. [provider] Taking Active Self           Med Note [name]   Tue Apr 09, 2015  8:28 AM)             Assessment:   Goals Addressed            This Visit's Progress   . "I cannot afford my Lantus" (pt-stated)       Current Barriers:  . Financial Barriers - Patient reports that he is unable to afford his insulin.   Pharmacist Clinical Goal(s):  [name] Kitchen Over the  next 30 days, patient will work with Pharmacist to address needs related to medication assistance  . Over the next 30 days, patient will work with LCSW to address addtional needs/discuss resources related to financial barriers  Interventions: . Comprehensive Medication Review o Patient needs new prescription for his albuterol inhaler o Currently taking allopurinol 200 mg once daily as prescribed by Dr. Javier Docker (medication list in chart updated) o Reports that he is  weighing himself daily and taking his bumetanide and metolazone as directed by his Cardiologist. o Counsel patient about the FDA recall of all ranitidine o Counsel patient about concern of sedation or dizziness with tramadol and gabapentin, particularly if taken together.  o Patient reports taking both pantoprazole 40 mg and omeprazole 40 mg daily to control his acid symptoms. 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 He states that he was previously on Nexium, which was sufficient to control his symptoms, but had to discontinue due to the formulary of his previous coverage - note that through [employer], esomeprazole is a tier 2 option.  . Counsel patient about his Medicare Part D benefits o Address specific formulary questions o Counsel patient about cost savings of receiving a 90 day supply, but patient denies current interest in making this change. . Collaboration with [facility] at [employer] regarding prescriptions on file and medication availability o Famotidine on back order since FDA recall of ranitidine.  [name] Kitchen Counsel patient about importance and benefits of blood sugar control . Collaboration with [facility] CPhT to assist patient with patient assistance application for Basaglar insulin . Discussed plans with patient for ongoing care management follow up and provided patient with direct contact information for care management team  Patient Self Care Activities:  . Patient to continue to work with  Pharmacist and [facility] CPhT to complete Basaglar patient assistance application. . Self administers medications as prescribed . Attends all scheduled provider appointments . Calls pharmacy for medication refills  Please see past updates related to this goal by clicking on the "Past Updates" button in the selected goal         Plan:  1) The CM team will reach out to the patient again over the next 7 days.  2) Follow up with provider re: a. Request new prescription for an albuterol HFA inhaler be sent to his pharmacy b. Let provider know about patient's GERD symptoms and his current regimen i. Will notify provider about ranitidine recall and famotidine back order ii. Will request that provider consider switching patient's current PPIs to esomeprazole magnesium  [name], PharmD, [place] [phone]

## 2018-05-11 NOTE — Patient Instructions (Signed)
Thank you allowing the Chronic Care Management Team to be a part of your care! It was a pleasure speaking with you today!     CCM (Chronic Care Management) Team    Janci Minor RN, BSN Nurse Care Coordinator  [phone]   [name] PharmD  Clinical Pharmacist  [phone]   [name] LCSW Clinical Social Worker [phone]  Visit Information  Goals Addressed            This Visit's Progress   . "I cannot afford my Lantus" (pt-stated)       Current Barriers:  . Financial Barriers - Patient reports that he is unable to afford his insulin.   Pharmacist Clinical Goal(s):  [name] Kitchen Over the next 30 days, patient will work with Pharmacist to address needs related to medication assistance  . Over the next 30 days, patient will work with LCSW to address addtional needs/discuss resources related to financial barriers  Interventions: . Comprehensive Medication Review o Patient needs new prescription for his albuterol inhaler o Currently taking allopurinol 200 mg once daily as prescribed by Dr. Javier Docker (medication list in chart updated) o Reports that he is weighing himself daily and taking his bumetanide and metolazone as directed by his Cardiologist. o Counsel patient about the FDA recall of all ranitidine o Counsel patient about concern of sedation or dizziness with tramadol and gabapentin, particularly if taken together.  o Patient reports taking both pantoprazole 40 mg and omeprazole 40 mg daily to control his acid symptoms. 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 He states that he was previously on Nexium, which was sufficient to control his symptoms, but had to discontinue due to the formulary of his previous coverage - note that through [employer], esomeprazole is a tier 2 option.  . Counsel patient about his Medicare Part D benefits o Address specific formulary questions o Counsel patient about cost savings of receiving a 90 day supply, but  patient denies current interest in making this change. . Collaboration with [facility] at [employer] regarding prescriptions on file and medication availability o Famotidine on back order since FDA recall of ranitidine.  [name] Kitchen Counsel patient about importance and benefits of blood sugar control . Collaboration with [facility] CPhT to assist patient with patient assistance application for Basaglar insulin . Discussed plans with patient for ongoing care management follow up and provided patient with direct contact information for care management team  Patient Self Care Activities:  . Patient to continue to work with Pharmacist and [facility] CPhT to complete Basaglar patient assistance application. . Self administers medications as prescribed . Attends all scheduled provider appointments . Calls pharmacy for medication refills  Please see past updates related to this goal by clicking on the "Past Updates" button in the selected goal         The patient verbalized understanding of instructions provided today and declined a print copy of patient instruction materials.   The CM team will reach out to the patient again over the next 7 days.   [name], PharmD, [place] [employer] [phone]

## 2018-05-12 ENCOUNTER — Ambulatory Visit: Payer: Self-pay | Admitting: Pharmacist

## 2018-05-12 DIAGNOSIS — K219 Gastro-esophageal reflux disease without esophagitis: Secondary | ICD-10-CM

## 2018-05-13 ENCOUNTER — Other Ambulatory Visit: Payer: Self-pay | Admitting: Pharmacy Technician

## 2018-05-13 NOTE — Chronic Care Management (AMB) (Signed)
  Chronic Care Management   Follow Up Note   05/13/2018 Name: [name] MRN: [id number] DOB: Feb 06, 1965  Referred by: Darren Hauser, DO Reason for referral : Chronic Care Management (Patient Outreach Call)   [name] is a 53 y.o. year old male who is a primary care patient of Darren Hauser, DO. The CCM team was consulted for assistance with chronic disease management and care coordination needs.    I reached out to [name] by phone today to follow up regarding medication management.  Review of patient status, including review of consultants reports, relevant laboratory and other test results, and collaboration with appropriate care team members and the patient's provider was performed as part of comprehensive patient evaluation and provision of chronic care management services.    Goals Addressed            This Visit's Progress   . "I cannot afford my Lantus" (pt-stated)       Current Barriers:  . Financial Barriers - Patient reports that he is unable to afford his insulin.   Pharmacist Clinical Goal(s):  [name] Kitchen Over the next 30 days, patient will work with Pharmacist to address needs related to medication assistance  . Over the next 30 days, patient will work with LCSW to address addtional needs/discuss resources related to financial barriers  Interventions: . Confirm with patient that he has started taking the esomeprazole and discontinued both the pantoprazole and omeprazole as directed by his PCP. o [name] confirms starting the esomeprazole and reports that the esomeprazole is controlling his acid symptoms. o Counsel patient to contact the office if his symptoms are uncontrolled in the future. [name] Kitchen Counsel patient that a new prescription for his albuterol inhaler is available to him at his pharmacy. . Collaboration with [facility] CPhT to assist patient with patient assistance application for Basaglar insulin . Discussed plans with patient  for ongoing care management follow up and provided patient with direct contact information for care management team  Patient Self Care Activities:  . Patient to continue to work with Pharmacist and [facility] CPhT to complete Basaglar patient assistance application. . Self administers medications as prescribed . Attends all scheduled provider appointments . Calls pharmacy for medication refills  Please see past updates related to this goal by clicking on the "Past Updates" button in the selected goal         PLAN  The CM team will reach out to the patient again over the next 14 days.   [name], PharmD, [place] [employer] [phone]

## 2018-05-13 NOTE — Patient Outreach (Signed)
[place] [facility]) Care Management  05/13/2018  [name] 07/11/1965 [id number]  Care coordination call placed to [facility] in regards to patient's Basaglar application.  Spoke to [place] who said patient had been APPROVED 05/11/2018-01/26/2019. She informed that the delivery was pending but that a request had been made to pharmacy. Sharyn Lull called over the the pharmacy and they advised the medication would be shipped out on Monday to the provider's office.  Will followup with patient in 3-5 business days to inquire if patient has picked up the medication.  Holton Sidman P. Jah Alarid, [place] Management [phone]

## 2018-05-13 NOTE — Patient Instructions (Signed)
Thank you allowing the Chronic Care Management Team to be a part of your care! It was a pleasure speaking with you today!     CCM (Chronic Care Management) Team    Janci Minor RN, BSN Nurse Care Coordinator  [phone]   [name] PharmD  Clinical Pharmacist  [phone]   [name] LCSW Clinical Social Worker [phone]  Visit Information  Goals Addressed            This Visit's Progress   . "I cannot afford my Lantus" (pt-stated)       Current Barriers:  . Financial Barriers - Patient reports that he is unable to afford his insulin.   Pharmacist Clinical Goal(s):  [name] Kitchen Over the next 30 days, patient will work with Pharmacist to address needs related to medication assistance  . Over the next 30 days, patient will work with LCSW to address addtional needs/discuss resources related to financial barriers  Interventions: . Confirm with patient that he has started taking the esomeprazole and discontinued both the pantoprazole and omeprazole as directed by his PCP. o Mr. Kimmer confirms starting the esomeprazole and reports that the esomeprazole is controlling his acid symptoms. o Counsel patient to contact the office if his symptoms are uncontrolled in the future. [name] Kitchen Counsel patient that a new prescription for his albuterol inhaler is available to him at his pharmacy. . Collaboration with [facility] CPhT to assist patient with patient assistance application for Basaglar insulin . Discussed plans with patient for ongoing care management follow up and provided patient with direct contact information for care management team  Patient Self Care Activities:  . Patient to continue to work with Pharmacist and [facility] CPhT to complete Basaglar patient assistance application. . Self administers medications as prescribed . Attends all scheduled provider appointments . Calls pharmacy for medication refills  Please see past updates related to this goal by clicking on the "Past  Updates" button in the selected goal         The patient verbalized understanding of instructions provided today and declined a print copy of patient instruction materials.   The CM team will reach out to the patient again over the next 14 days.   [name], PharmD, [place] [employer] [phone]

## 2018-05-18 ENCOUNTER — Other Ambulatory Visit: Payer: Self-pay | Admitting: Pharmacy Technician

## 2018-05-18 NOTE — Patient Outreach (Signed)
[facility]) Care Management  05/18/2018  SHANTA DORVIL Jun 02, 1965 [id number]    Care coordination call placed to [place] in regards to patient's Basaglar application.   Spoke to [place] who informed 1 box of Basaglar was delivered to the provider's office in [place] on 05/17/2018 at 8:58am.  Will followup with patient in 2-3 business days to inquire if he has received the medication.  Ayianna Darnold P. Takeesha Isley, [place] Management [phone]

## 2018-05-18 NOTE — Patient Outreach (Signed)
[facility] [facility]) Care Management  05/18/2018  OTTO FELKINS [birth year] [id number]   ADDENDUM  Incoming call received from patient in regards to [facility] application for [employer].   Spoke to patient, HIPAA identifiers verified. Patient called to inform me that he had picked up the 1 box of Basaglar that was delivered to the provider's office. Patient quesitoned and informed that this was taking the place of his Lantus which I informed him that it was. Patient inquired how long this 1 box was to last him and based on his current regimen of 15 units daily, informed patient that it should last approximately 100 days. Patient informed that he was going to use up his 2 pens of samples before using this box. Informed patient that was ok.    Discussed with patient how to obtain his refills. Informed patient that he was set up for automatic refills but that he needed to remain proactive. Informed patient if he had not heard or received any medication when he got down to a 2 week supply then he needed to reach out to [facility] by calling the number on the label of Basaglar. Patient verbalized understanding.  Patient confirmed having my name and number for future reference and denied having any other questions or concerns at this time as it related to patient assistance.  Will route note to [place] for case closure and will remove myself from care team.  Luiz Ochoa. Elektra Wartman, [place] Management [phone]

## 2018-05-25 ENCOUNTER — Other Ambulatory Visit: Payer: Self-pay

## 2018-05-25 ENCOUNTER — Telehealth: Payer: Self-pay | Admitting: Family Medicine

## 2018-05-25 ENCOUNTER — Ambulatory Visit: Payer: Self-pay | Admitting: Pharmacist

## 2018-05-25 DIAGNOSIS — IMO0002 Reserved for concepts with insufficient information to code with codable children: Secondary | ICD-10-CM

## 2018-05-25 DIAGNOSIS — E1165 Type 2 diabetes mellitus with hyperglycemia: Principal | ICD-10-CM

## 2018-05-25 DIAGNOSIS — Z794 Long term (current) use of insulin: Principal | ICD-10-CM

## 2018-05-25 DIAGNOSIS — N184 Chronic kidney disease, stage 4 (severe): Principal | ICD-10-CM

## 2018-05-25 DIAGNOSIS — E1122 Type 2 diabetes mellitus with diabetic chronic kidney disease: Secondary | ICD-10-CM

## 2018-05-25 MED ORDER — ONETOUCH ULTRA 2 W/DEVICE KIT: PACK | 0 refills | Status: DC

## 2018-05-25 MED ORDER — ONETOUCH ULTRASOFT LANCETS MISC: 3 refills | Status: DC

## 2018-05-25 MED ORDER — ONETOUCH ULTRA BLUE VI STRP: ORAL_STRIP | 3 refills | Status: DC

## 2018-05-25 NOTE — Telephone Encounter (Signed)
-----   Message from [name], [facility] sent at 05/25/2018  1:19 PM EDT ----- Regarding: New Glucometer Good afternoon, Dr. Parks Ranger!   When you have a chance, would you please send in prescriptions for a One Touch meter and supplies to patient's pharmacy, [facility]?  Thank you!  [name]

## 2018-05-25 NOTE — Chronic Care Management (AMB) (Signed)
  Chronic Care Management   Follow Up Note   05/25/2018 Name: [name] MRN: [id number] DOB: 04/29/1965  Referred by: [name], DO Reason for referral : Chronic Care Management (Patient Phone Call)   [name] is a 53 y.o. year old male who is a primary care patient of [name], DO. The CCM team was consulted for assistance with chronic disease management and care coordination needs.    I reached out to [name] by phone today to follow up regarding medication assistance and diabetes management.  Review of patient status, including review of consultants reports, relevant laboratory and other test results, and collaboration with appropriate care team members and the patient's provider was performed as part of comprehensive patient evaluation and provision of chronic care management services.    Goals Addressed            This Visit's Progress   . "I cannot afford my Lantus" (pt-stated)       Current Barriers:  . Financial Barriers - Patient reports that he is unable to afford his insulin.   Pharmacist Clinical Goal(s):  [name] Kitchen Over the next 30 days, patient will work with Pharmacist to address needs related to medication assistance  . Over the next 30 days, patient will work with LCSW to address addtional needs/discuss resources related to financial barriers  Interventions: [name] Kitchen Medication Assistance application process completed: Patient confirms that he has received his Basaglar insulin via the [employer] patient assistance program. . Counsel patient on the importance of checking his blood sugar . Identify that patient is in need of a new glucometer. o Per [employer] Summary of benefits, Freestyle, Precision and One Touch testing supplies are preferred. o Send [employer] to patient's PCP asking that new prescriptions for a glucometer and testing supplies be sent to the patient's pharmacy . Collaborate with CCM Social  Worker about reaching out to patient to address additional financial needs and resources available to the patient. . Discussed plans with patient for ongoing care management follow up and provided patient with direct contact information for care management team  Patient Self Care Activities:  . Self administers medications as prescribed . Attends all scheduled provider appointments . Calls pharmacy for medication refills  Please see past updates related to this goal by clicking on the "Past Updates" button in the selected goal         Plan  The CM team will reach out to the patient again over the next 3 days.   [name], PharmD, [place] [employer] [phone]

## 2018-05-25 NOTE — Telephone Encounter (Signed)
Ordered OneTouch Ultra 2 device and supplies, check glucose up to x 3 daily, sent 90 day supplies  [name], DO [facility] 05/25/2018, 6:32 PM

## 2018-05-26 ENCOUNTER — Ambulatory Visit: Payer: Self-pay | Admitting: Pharmacist

## 2018-05-26 ENCOUNTER — Other Ambulatory Visit: Payer: Self-pay

## 2018-05-26 ENCOUNTER — Ambulatory Visit: Payer: Self-pay | Admitting: Licensed Clinical Social Worker

## 2018-05-26 DIAGNOSIS — IMO0002 Reserved for concepts with insufficient information to code with codable children: Secondary | ICD-10-CM

## 2018-05-26 DIAGNOSIS — Z794 Long term (current) use of insulin: Principal | ICD-10-CM

## 2018-05-26 DIAGNOSIS — E1122 Type 2 diabetes mellitus with diabetic chronic kidney disease: Secondary | ICD-10-CM

## 2018-05-26 DIAGNOSIS — N184 Chronic kidney disease, stage 4 (severe): Principal | ICD-10-CM

## 2018-05-26 DIAGNOSIS — E1165 Type 2 diabetes mellitus with hyperglycemia: Principal | ICD-10-CM

## 2018-05-26 NOTE — Chronic Care Management (AMB) (Signed)
  Chronic Care Management   Follow Up Note   05/26/2018 Name: [name] MRN: [id number] DOB: 1965/03/11  Referred by: Darren Hauser, DO Reason for referral : Chronic Care Management (Patient Phone Call)   [name] is a 53 y.o. year old male who is a primary care patient of Darren Hauser, DO. The CCM team was consulted for assistance with chronic disease management and care coordination needs.    I reached out to [name] by phone today to follow up regarding his diabetes testing supplies  Review of patient status, including review of consultants reports, relevant laboratory and other test results, and collaboration with appropriate care team members and the patient's provider was performed as part of comprehensive patient evaluation and provision of chronic care management services.    Goals Addressed            This Visit's Progress   . "I cannot afford my Lantus" (pt-stated)       Current Barriers:  . Financial Barriers - Patient reports that he is unable to afford his insulin.   Pharmacist Clinical Goal(s):  [name] Kitchen Over the next 30 days, patient will work with Pharmacist to address needs related to medication assistance  . Over the next 30 days, patient will work with LCSW to address addtional needs/discuss resources related to financial barriers  Interventions: . Counsel patient on the importance of checking his blood sugar and keeping log . Identify that patient is in need of a new glucometer. o Collaborate with patient's PCP to have new prescriptions for a glucometer and testing supplies be sent to the patient's pharmacy o Collaborate with Pharmacist at [facility] to confirm that One Touch glucometer and supply prescriptions were processed and that patient does not have a copayment. o Patient states that he will pick up his new glucometer today and that he believes that he has used this same One Touch monitor before. Denies any  questions. [name] with CCM Social Worker about reaching out to patient to address additional financial needs and resources available to the patient.  Patient Self Care Activities:  . Self administers medications as prescribed . Attends all scheduled provider appointments . Calls pharmacy for medication refills  Please see past updates related to this goal by clicking on the "Past Updates" button in the selected goal         Plan  The CM team will reach out to the patient again over the next 30 days.   [name], PharmD, [place] [employer] [phone]

## 2018-05-26 NOTE — Patient Instructions (Signed)
Thank you allowing the Chronic Care Management Team to be a part of your care! It was a pleasure speaking with you today!     CCM (Chronic Care Management) Team    Janci Minor RN, BSN Nurse Care Coordinator  [phone]   [name] PharmD  Clinical Pharmacist  [phone]   [name] LCSW Clinical Social Worker [phone]  Visit Information  Goals Addressed            This Visit's Progress   . COMPLETED: "I cannot afford my Lantus" (pt-stated)       Current Barriers:  . Financial Barriers - Patient reports that he is unable to afford his insulin.   Pharmacist Clinical Goal(s):  [name] Kitchen Over the next 30 days, patient will work with Pharmacist to address needs related to medication assistance  . Over the next 30 days, patient will work with LCSW to address addtional needs/discuss resources related to financial barriers  Interventions: . Counsel patient on the importance of checking his blood sugar and keeping log . Identify that patient is in need of a new glucometer. o Collaborate with patient's PCP to have new prescriptions for a glucometer and testing supplies be sent to the patient's pharmacy o Collaborate with Pharmacist at [facility] to confirm that glucometer and supply prescriptions were processed and that patient does not have a copayment. o Patient states that he will pick up the monitor and that he believes that he has used this same One Touch monitor before . Collaborate with CCM Social Worker about reaching out to patient to address additional financial needs and resources available to the patient.  Patient Self Care Activities:  . Self administers medications as prescribed . Attends all scheduled provider appointments . Calls pharmacy for medication refills  Please see past updates related to this goal by clicking on the "Past Updates" button in the selected goal         The patient verbalized understanding of instructions provided  today and declined a print copy of patient instruction materials.   The CM team will reach out to the patient again over the next 30 days.   [name], PharmD, [place] [employer] [phone]

## 2018-05-26 NOTE — Chronic Care Management (AMB) (Signed)
  Care Management Note   [name] is a 53 y.o. year old male who is a primary care patient of [name], DO. The CM team was consulted for assistance with chronic disease management and care coordination.   I reached out to [name] by phone today and provided social work support and [employer].   Review of patient status, including review of consultants reports, relevant laboratory and other test results, and collaboration with appropriate care team members and the patient's provider was performed as part of comprehensive patient evaluation and provision of chronic care management services.  Goals Addressed    . "I want to know what resources are available to me" (pt-stated)       Current Barriers:  . Financial constraints . Limited social support . Limited access to food . ADL IADL limitations . Social Isolation . Limited access to caregiver . Lacks knowledge of community resource: financial assistance resources and personal care service resources that are available to him   Clinical Social Work Clinical Goal(s):  [name] Kitchen Over the next 90 days, patient will work with LCSW to address needs related to lack of support within the home . Over the next 90 days, client will follow up with financial assistance programs/resources* as directed by SW  Interventions: . Patient interviewed and appropriate assessments performed . Provided patient with information about crisis support and financial support resources within [facility] . Discussed plans with patient for ongoing care management follow up and provided patient with direct contact information for care management team . Advised patient to check email for community resources that were sent out on 05/26/2018. Education provided today as well . Collaborated with Museum/gallery curator (community agency) re: to check and see if patient is eligible for [employer] program through [place]. Voice  message left encouraging a return call in order to discuss patient's referral. . Assisted patient/caregiver with obtaining information about health plan benefits  Patient Self Care Activities:  . Attends all scheduled provider appointments . Calls provider office for new concerns or questions  Initial goal documentation   Follow Up Plan: The CM team will reach out to the patient again over the next 14 days.   [name], BSW, MSW, [facility] Care Management [email or web address] Phone: [phone]

## 2018-05-26 NOTE — Patient Instructions (Signed)
Thank you allowing the Chronic Care Management Team to be a part of your care! It was a pleasure speaking with you today!     CCM (Chronic Care Management) Team    Janci Minor RN, BSN Nurse Care Coordinator  [phone]   [name] PharmD  Clinical Pharmacist  [phone]   [name] LCSW Clinical Social Worker [phone]  Visit Information  Goals Addressed            This Visit's Progress   . "I cannot afford my Lantus" (pt-stated)       Current Barriers:  . Financial Barriers - Patient reports that he is unable to afford his insulin.   Pharmacist Clinical Goal(s):  [name] Kitchen Over the next 30 days, patient will work with Pharmacist to address needs related to medication assistance  . Over the next 30 days, patient will work with LCSW to address addtional needs/discuss resources related to financial barriers  Interventions: [name] Kitchen Medication Assistance application process completed: Patient confirms that he has received his Basaglar insulin via the [employer] patient assistance program. . Counsel patient on the importance of checking his blood sugar . Identify that patient is in need of a new glucometer. o Collaborate with patient's PCP to have new prescriptions for a glucometer and testing supplies be sent to the patient's pharmacy o Collaborate with Pharmacist at [facility] to confirm that glucometer and supply prescriptions were processed and that patient does not have a copayment. [name] with CCM Social Worker about reaching out to patient to address additional financial needs and resources available to the patient.  Patient Self Care Activities:  . Self administers medications as prescribed . Attends all scheduled provider appointments . Calls pharmacy for medication refills  Please see past updates related to this goal by clicking on the "Past Updates" button in the selected goal         The patient verbalized understanding of instructions  provided today and declined a print copy of patient instruction materials.   The CM team will reach out to the patient again over the next 3 days.   [name], PharmD, [place] [employer] [phone]

## 2018-06-03 ENCOUNTER — Other Ambulatory Visit: Payer: Self-pay | Admitting: Family Medicine

## 2018-06-03 DIAGNOSIS — E1122 Type 2 diabetes mellitus with diabetic chronic kidney disease: Secondary | ICD-10-CM

## 2018-06-03 DIAGNOSIS — IMO0002 Reserved for concepts with insufficient information to code with codable children: Secondary | ICD-10-CM

## 2018-06-06 ENCOUNTER — Other Ambulatory Visit: Payer: Self-pay

## 2018-06-06 ENCOUNTER — Telehealth: Payer: Self-pay

## 2018-06-08 ENCOUNTER — Other Ambulatory Visit: Payer: Self-pay

## 2018-06-08 ENCOUNTER — Ambulatory Visit (INDEPENDENT_AMBULATORY_CARE_PROVIDER_SITE_OTHER): Payer: PPO | Admitting: Family Medicine

## 2018-06-08 ENCOUNTER — Encounter: Payer: Self-pay | Admitting: Family Medicine

## 2018-06-08 DIAGNOSIS — J302 Other seasonal allergic rhinitis: Secondary | ICD-10-CM | POA: Diagnosis not present

## 2018-06-08 DIAGNOSIS — I129 Hypertensive chronic kidney disease with stage 1 through stage 4 chronic kidney disease, or unspecified chronic kidney disease: Secondary | ICD-10-CM | POA: Diagnosis not present

## 2018-06-08 DIAGNOSIS — E1122 Type 2 diabetes mellitus with diabetic chronic kidney disease: Secondary | ICD-10-CM | POA: Diagnosis not present

## 2018-06-08 DIAGNOSIS — Z794 Long term (current) use of insulin: Secondary | ICD-10-CM | POA: Diagnosis not present

## 2018-06-08 DIAGNOSIS — I5022 Chronic systolic (congestive) heart failure: Secondary | ICD-10-CM

## 2018-06-08 DIAGNOSIS — N184 Chronic kidney disease, stage 4 (severe): Secondary | ICD-10-CM | POA: Diagnosis not present

## 2018-06-08 DIAGNOSIS — N2581 Secondary hyperparathyroidism of renal origin: Secondary | ICD-10-CM

## 2018-06-08 DIAGNOSIS — M1A39X0 Chronic gout due to renal impairment, multiple sites, without tophus (tophi): Secondary | ICD-10-CM | POA: Diagnosis not present

## 2018-06-08 MED ORDER — ALLOPURINOL 100 MG PO TABS: 200.0000 mg | ORAL_TABLET | Freq: Every day | ORAL | 1 refills | Status: DC

## 2018-06-08 MED ORDER — LORATADINE 10 MG PO TABS: 10.0000 mg | ORAL_TABLET | Freq: Every day | ORAL | 3 refills | Status: DC

## 2018-06-08 MED ORDER — LISINOPRIL 2.5 MG PO TABS: 2.5000 mg | ORAL_TABLET | Freq: Every day | ORAL | 3 refills | Status: DC

## 2018-06-08 NOTE — Progress Notes (Signed)
Virtual Visit via Telephone The purpose of this virtual visit is to provide medical care while limiting exposure to the novel coronavirus (COVID19) for both patient and office staff.  Consent was obtained for phone visit:  Yes.   Answered questions that patient had about telehealth interaction:  Yes.   I discussed the limitations, risks, security and privacy concerns of performing an evaluation and management service by telephone. I also discussed with the patient that there may be a patient responsible charge related to this service. The patient expressed understanding and agreed to proceed.  Patient Location: Home Provider Location: [facility])  ---------------------------------------------------------------------- Chief Complaint  Patient presents with  . Diabetes    obtw seasonal allergies    S: Reviewed CMA documentation. I have called patient and gathered additional HPI as follows:   CHRONIC DM, Type 2 w/ complication Reports no concerns. He is doing better with checking CBGs has new glucometer Due for A1c, did not return to office this week, he will come back tomorrow CBGs: Avg 140-175, Low >100, High < 200. Meds: Basaglar 20 units (self titrated up from 15u) - on financial assistance through manufacturer Reports good compliance. Tolerating well w/o side-effects Currently on ACEi Lifestyle: - Diet (improving) - Exercise (limited) Denies hypoglycemia, polyuria, visual changes, numbness or tingling.  CHRONIC HTN w/ CKD-IV / 2ndary Renal hyperparathyroidism Chronic Systolic CHF (severe non ischemic cardiomyopathy) Reports normal BP readings. No new concerns. Followed by [facility] nephrology. Followed by [facility] Cardiology Current Meds - Bumex 34m, Carvedilol 552mBID (2573m2 per dose), Lisinopril 2.5mg100mily, Metolazone 5mg 79meports good compliance, took meds today. Tolerating well, w/o complaints. Denies CP, dyspnea, HA, edema, dizziness /  lightheadedness  GOUT / Chronic Pain Syndrome Limited by CKD with med options. Has history of multiple joint pain and chronic gout issues. Last Uric acid >12 On Allopurinol 200mg 96my, doing well needs refill - back per Nephrology Taking Gabapentin 200mg B61mith very significant relief, helps reduce itching and tingling and pain Taking PRN only Tramadol - works well for him, with history of gout and joint pain, cannot take NSAID, has refills available.  Seasonal Allergies Request order rx Loratadine 10mg da56m usually controls allergies. With some congestion and sneezing at times.  Denies any high risk travel to areas of current concern for COVID19. Denies any known or suspected exposure to person with or possibly with COVID19.  Denies any fevers, chills, sweats, body ache, cough, shortness of breath, sinus pain or pressure, headache, abdominal pain, diarrhea  Past Medical History:  Diagnosis Date  . Allergy   . Asthma   . Gout   . Hypertension   . Morbid obesity (HCC)    [place] History   Tobacco Use  . Smoking status: Never Smoker  . Smokeless tobacco: Never Used  Substance Use Topics  . Alcohol use: Yes    Comment: a glass of wine and a couple beers a day  . Drug use: No    Current Outpatient Medications:  .  albuterol (PROVENTIL) (2.5 MG/3ML) 0.083% nebulizer solution, Take 3 mLs (2.5 mg total) by nebulization every 6 (six) hours as needed for wheezing or shortness of breath., Disp: 150 mL, Rfl: 1 .  albuterol (VENTOLIN HFA) 108 (90 Base) MCG/ACT inhaler, Inhale 2 puffs into the lungs every 4 (four) hours as needed for wheezing or shortness of breath., Disp: 18 g, Rfl: 2 .  allopurinol (ZYLOPRIM) 100 MG tablet, Take 2 tablets (200 mg total)  by mouth daily., Disp: 90 tablet, Rfl: 1 .  aspirin 81 MG chewable tablet, Chew 81 mg by mouth daily., Disp: , Rfl:  .  atorvastatin (LIPITOR) 20 MG tablet, Take 1 tablet (20 mg total) by mouth daily., Disp: 30 tablet, Rfl: 2 .   blood glucose meter kit and supplies KIT, Brand of choice; LON 99 months; check FSBS 3x a day; E11.65; disp one meter with 100 strips + 1 refill of strips, Disp: 1 each, Rfl: 0 .  Blood Glucose Monitoring Suppl (ONE TOUCH ULTRA 2) w/Device KIT, Use to check blood glucose up to 3 x daily, Disp: 1 each, Rfl: 0 .  bumetanide (BUMEX) 1 MG tablet, Take 1 mg by mouth daily. States 2 tablets twice a day, Disp: , Rfl:  .  carvedilol (COREG) 25 MG tablet, Take 2 tablets (50 mg total) by mouth 2 (two) times daily with a meal., Disp: 120 tablet, Rfl: 2 .  esomeprazole (NEXIUM) 40 MG capsule, Take 1 capsule (40 mg total) by mouth daily before breakfast., Disp: 90 capsule, Rfl: 1 .  gabapentin (NEURONTIN) 100 MG capsule, Take 2 capsules (200 mg total) by mouth 2 (two) times daily., Disp: 120 capsule, Rfl: 2 .  Insulin Glargine (BASAGLAR KWIKPEN) 100 UNIT/ML SOPN, Inject 15 Units into the skin daily. Submitted to [employer] for assistance, Disp: , Rfl:  .  Insulin Pen Needle (B-D UF III MINI PEN NEEDLES) 31G X 5 MM MISC, 1 each by Does not apply route daily. Use once daily with Victoza., Disp: 100 each, Rfl: 12 .  Lancets (ONETOUCH ULTRASOFT) lancets, Check blood glucose up to 3 x daily, Disp: 300 each, Rfl: 3 .  lisinopril (ZESTRIL) 2.5 MG tablet, Take 1 tablet (2.5 mg total) by mouth daily., Disp: 90 tablet, Rfl: 3 .  loratadine (CLARITIN) 10 MG tablet, Take 1 tablet (10 mg total) by mouth daily., Disp: 90 tablet, Rfl: 3 .  magnesium oxide (MAG-OX) 400 MG tablet, Take 1 tablet by mouth daily., Disp: , Rfl:  .  metolazone (ZAROXOLYN) 5 MG tablet, Take 5 mg by mouth daily. as needed (for weight gain, as directed by your provider)., Disp: , Rfl:  .  ONE TOUCH ULTRA TEST test strip, Check sugar 3 x daily, Disp: 300 each, Rfl: 3 .  simethicone (MYLICON) 127 MG chewable tablet, Chew 125 mg by mouth every 6 (six) hours as needed for flatulence., Disp: , Rfl:  .  traMADol (ULTRAM) 50 MG tablet, Take 1-2 tablets (50-100  mg total) by mouth every 12 (twelve) hours as needed., Disp: 30 tablet, Rfl: 2 .  vitamin C (ASCORBIC ACID) 500 MG tablet, Take 1,000 mg by mouth daily., Disp: , Rfl:   Depression screen [facility] 2/9 06/08/2018 03/08/2018 12/02/2017  Decreased Interest 0 0 0  Down, Depressed, Hopeless 0 0 0  PHQ - 2 Score 0 0 0    No flowsheet data found.  -------------------------------------------------------------------------- O: No physical exam performed due to remote telephone encounter.  Lab results reviewed.  Recent Labs    06/15/17 0835 10/13/17 0802 02/28/18 1245  HGBA1C 7.0* 6.8* 8.1*    No results found for this or any previous visit (from the past 2160 hour(s)).  -------------------------------------------------------------------------- A&P:  Problem List Items Addressed This Visit    Benign hypertension with CKD (chronic kidney disease) stage IV (HCC) - Primary    Stable CKD-IV Followed by [facility] Dr Juleen China, [facility] On ACEi low dose BB diuretics      Relevant Medications  lisinopril (ZESTRIL) 2.5 MG tablet   Chronic systolic heart failure (HCC)    Stable, currently well compensated, chronic systolic CHF secondary to non-ischemic cardiomyopathy Followed by [facility] Cardiology CHF clinic On med management low dose ACEi Lisinopril 2.66m, Carvedilol, Bumex Has declined ICD in past      Relevant Medications   lisinopril (ZESTRIL) 2.5 MG tablet   CKD (chronic kidney disease), stage IV (HCC)    Stable CKD-IV Followed by [facility] Dr KJuleen ChinaRemains on allopurinol lower dosage for gout prophylaxis On low dose ACEi [facility] Continue improved hydration      Gout    Stable gout, chronic issue w/o current flare up [facility] KJuleen ChinaSecondary to CKD-IV  Back on Allopurinol 2073mdaily, refill Last uric acid >12 On Gabapentin daily and Tramadol PRN, cannot take NSAID due to CKD      Relevant Medications   allopurinol (ZYLOPRIM) 100 MG tablet    Secondary renal hyperparathyroidism ([facility]   Followed by nephrology Secondary to CKDIV      Type 2 diabetes mellitus, controlled, with renal complications ([facility]   Interval elevated A1c from 6 up to 8 now last 02/2018 - due for A1c did not arrive this week, will return tomorrow  Complications - CKD-IV, DM retinopathy Also complicated by hypoglycemia episodes Failed Bydureon (unable to operate pen), Trulicity (adherence)  Plan:  1. Improved on Basaglar 20u daily 2. Encourage improved lifestyle - low carb, low sugar diet, reduce portion size, continue improving regular exercise 3. Check CBG, bring log to next visit for review 4. Continue ASA, ACEi, Statin 5. Future need to schedule DM Eye - again advised him that he has been non adherent with this maintenance item      Relevant Medications   lisinopril (ZESTRIL) 2.5 MG tablet    Other Visit Diagnoses    Seasonal allergies       Relevant Medications   loratadine (CLARITIN) 10 MG tablet      Meds ordered this encounter  Medications  . loratadine (CLARITIN) 10 MG tablet    Sig: Take 1 tablet (10 mg total) by mouth daily.    Dispense:  90 tablet    Refill:  3  . lisinopril (ZESTRIL) 2.5 MG tablet    Sig: Take 1 tablet (2.5 mg total) by mouth daily.    Dispense:  90 tablet    Refill:  3  . allopurinol (ZYLOPRIM) 100 MG tablet    Sig: Take 2 tablets (200 mg total) by mouth daily.    Dispense:  90 tablet    Refill:  1    Follow-up: - Return in 4 months for Annual Physical - Future labs ordered for A1c tomorrow 5/14 - Then will place future orders for Annual Labs for 09/2018  Patient verbalizes understanding with the above medical recommendations including the limitation of remote medical advice.  Specific follow-up and call-back criteria were given for patient to follow-up or seek medical care more urgently if needed.   - Time spent in direct consultation with patient on phone: 11 minutes   AlNobie Putnam DOEvergreenroup 06/08/2018, 8:17 AM

## 2018-06-08 NOTE — Assessment & Plan Note (Signed)
Stable gout, chronic issue w/o current flare up Followed by [facility] Dr Juleen China Secondary to CKD-IV  Back on Allopurinol 200mg  daily, refill Last uric acid >12 On Gabapentin daily and Tramadol PRN, cannot take NSAID due to CKD

## 2018-06-08 NOTE — Assessment & Plan Note (Signed)
Stable, currently well compensated, chronic systolic CHF secondary to non-ischemic cardiomyopathy Followed by [facility] Cardiology CHF clinic On med management low dose ACEi Lisinopril 2.5mg , Carvedilol, Bumex Has declined ICD in past

## 2018-06-08 NOTE — Assessment & Plan Note (Signed)
Stable CKD-IV Followed by [facility] Dr Juleen China, Cardilogy On ACEi low dose BB diuretics

## 2018-06-08 NOTE — Assessment & Plan Note (Signed)
Interval elevated A1c from 6 up to 8 now last 02/2018 - due for A1c did not arrive this week, will return tomorrow  Complications - CKD-IV, DM retinopathy Also complicated by hypoglycemia episodes Failed Bydureon (unable to operate pen), Trulicity (adherence)  Plan:  1. Improved on Basaglar 20u daily 2. Encourage improved lifestyle - low carb, low sugar diet, reduce portion size, continue improving regular exercise 3. Check CBG, bring log to next visit for review 4. Continue ASA, ACEi, Statin 5. Future need to schedule DM Eye - again advised him that he has been non adherent with this maintenance item

## 2018-06-08 NOTE — Assessment & Plan Note (Signed)
Stable CKD-IV Followed by [facility] Dr Juleen China Remains on allopurinol lower dosage for gout prophylaxis On low dose ACEi On diuretics per [facility] CHF Continue improved hydration

## 2018-06-08 NOTE — Patient Instructions (Addendum)
Keep up good work Refilled meds  Return for A1c lab draw tomorrow - we will notify you.  Request other refills from pharmacy  Sent Loratadine (generic claritin) to pharmacy   DUE for FASTING BLOOD WORK (no food or drink after midnight before the lab appointment, only water or coffee without cream/sugar on the morning of)  SCHEDULE "Lab Only" visit in the morning at the clinic for lab draw in 4 MONTHS   - Make sure Lab Only appointment is at about 1 week before your next appointment, so that results will be available  For Lab Results, once available within 2-3 days of blood draw, you can can log in to MyChart online to view your results and a brief explanation. Also, we can discuss results at next follow-up visit.   Please schedule a Follow-up Appointment to: Return in about 4 months (around 10/09/2018) for Annual Physical.  If you have any other questions or concerns, please feel free to call the office or send a message through [place]. You may also schedule an earlier appointment if necessary.  Additionally, you may be receiving a survey about your experience at our office within a few days to 1 week by e-mail or mail. We value your feedback.  [name], DO [place]

## 2018-06-08 NOTE — Assessment & Plan Note (Signed)
Followed by nephrology Secondary to CKDIV 

## 2018-06-09 ENCOUNTER — Telehealth: Payer: Self-pay

## 2018-06-09 ENCOUNTER — Ambulatory Visit: Payer: Self-pay | Admitting: Licensed Clinical Social Worker

## 2018-06-09 ENCOUNTER — Other Ambulatory Visit: Payer: PPO

## 2018-06-09 NOTE — Chronic Care Management (AMB) (Signed)
  Care Management Note   [name] is a 53 y.o. year old male who is a primary care patient of [name], DO . The CM team was consulted for assistance with Caregiver Stress.   Review of patient status, including review of consultants reports, rand collaboration with appropriate care team members and the patient's provider was performed as part of comprehensive patient evaluation and provision of chronic care management services. Telephone outreach to patient today to provide CCM services.   I reached out to [name] by phone today to follow up on emailed resources but was unable to reach him successfully. HIPPA compliant voice message was left encouraging a return call once available.  LCSW has still not received return call back from [name]. LCSW completed additional call to [facility] but was unable to reach anyone. LCSW left another voice message asking that they return call back in order for LCSW to place patient on wait list for program.  Follow Up Plan: SW will follow up with patient by phone over the next 2 weeks  [name], [place], MSW, [name] Phone: [phone]

## 2018-06-10 DIAGNOSIS — Z794 Long term (current) use of insulin: Secondary | ICD-10-CM | POA: Diagnosis not present

## 2018-06-10 DIAGNOSIS — E1122 Type 2 diabetes mellitus with diabetic chronic kidney disease: Secondary | ICD-10-CM | POA: Diagnosis not present

## 2018-06-10 DIAGNOSIS — N184 Chronic kidney disease, stage 4 (severe): Secondary | ICD-10-CM | POA: Diagnosis not present

## 2018-06-10 DIAGNOSIS — E1165 Type 2 diabetes mellitus with hyperglycemia: Secondary | ICD-10-CM | POA: Diagnosis not present

## 2018-06-11 LAB — HEMOGLOBIN A1C
Hgb A1c MFr Bld: 8.7 % of total Hgb — ABNORMAL HIGH (ref <5.7)
Mean Plasma Glucose: 203 (calc)
eAG (mmol/L): 11.2 (calc)

## 2018-06-13 ENCOUNTER — Ambulatory Visit: Payer: PPO | Admitting: *Deleted

## 2018-06-13 ENCOUNTER — Ambulatory Visit: Payer: Self-pay | Admitting: Licensed Clinical Social Worker

## 2018-06-13 DIAGNOSIS — J984 Other disorders of lung: Secondary | ICD-10-CM | POA: Diagnosis not present

## 2018-06-13 DIAGNOSIS — I5022 Chronic systolic (congestive) heart failure: Secondary | ICD-10-CM

## 2018-06-13 DIAGNOSIS — I10 Essential (primary) hypertension: Secondary | ICD-10-CM | POA: Diagnosis not present

## 2018-06-13 DIAGNOSIS — Z720 Tobacco use: Secondary | ICD-10-CM | POA: Diagnosis not present

## 2018-06-13 DIAGNOSIS — Z6841 Body Mass Index (BMI) 40.0 and over, adult: Secondary | ICD-10-CM | POA: Diagnosis not present

## 2018-06-13 DIAGNOSIS — N184 Chronic kidney disease, stage 4 (severe): Secondary | ICD-10-CM

## 2018-06-13 DIAGNOSIS — E1129 Type 2 diabetes mellitus with other diabetic kidney complication: Secondary | ICD-10-CM | POA: Diagnosis not present

## 2018-06-13 DIAGNOSIS — N183 Chronic kidney disease, stage 3 (moderate): Secondary | ICD-10-CM | POA: Diagnosis not present

## 2018-06-13 DIAGNOSIS — E1165 Type 2 diabetes mellitus with hyperglycemia: Secondary | ICD-10-CM | POA: Diagnosis not present

## 2018-06-13 DIAGNOSIS — E1122 Type 2 diabetes mellitus with diabetic chronic kidney disease: Secondary | ICD-10-CM

## 2018-06-13 NOTE — Chronic Care Management (AMB) (Signed)
  Chronic Care Management   Follow Up Note   06/13/2018 Name: [name] MRN: [id number] DOB: 1965-09-21  Referred by: [name], DO Reason for referral : No chief complaint on file.   [name] is a 53 y.o. year old male who is a primary care patient of [name], DO. The CCM team was consulted for assistance with chronic disease management and care coordination needs.    Review of patient status, including review of consultants reports, relevant laboratory and other test results, and collaboration with appropriate care team members and the patient's provider was performed as part of comprehensive patient evaluation and provision of chronic care management services.   Talked with patient on the phone today. Attempted to address diabetes, HF and COPD patient was in the car and did not want to address these issues at this time. He stated,"I do everything I am supposed to do and I only eat what I am supposed to." Patient did say he obtained his new glucometer and had been checking his sugars at night 2 hours after eating (around 7:30pm) and he stated the average is 170. He did not have his meter with him when I called.  Patient concerned about Covid-19 and not catching the virus. Talked with patient for awhile to develop a rapport, will attempt to address disease education at next conversation.   Goals Addressed            This Visit's Progress   . I don't want to catch the Corona Virus (pt-stated)       Current Barriers:  [name] Kitchen Knowledge Deficits related to COVID-19 and impact on patient self health management  Clinical Goal(s):  [name] Kitchen Over the next 30 days, patient will verbalize basic understanding of COVID-19 impact on individual health and self health management as evidenced by verbalization of basic understanding of COVID-19 as a viral disease, measures to prevent exposure, signs and symptoms, when to contact provider  Interventions: . Advised  patient to wear mask when in public, wash hands frequently and do not touch his face . Provided education to patient re: Covid-19 . Discussed plans with patient for ongoing care management follow up and provided patient with direct contact information for care management team  Patient Self Care Activities:  . Calls provider office for new concerns or questions  Initial goal documentation                       The CM team will reach out to the patient again over the next 14 days.  The patient has been provided with contact information for the chronic care management team and has been advised to call with any health related questions or concerns.   Merlene Morse Lilliam Chamblee RN, BSN Nurse Case Pharmacist, [facility]  [phone]) [employer]

## 2018-06-13 NOTE — Chronic Care Management (AMB) (Signed)
  Chronic Care Management    Clinical Social Work Follow Up Note  06/13/2018 Name: [name] MRN: [id number] DOB: 1965-10-27  [name] is a 53 y.o. year old male who is a primary care patient of [name], DO. The CCM team was consulted for assistance with Caregiver Stress. LCSW received incoming return call from [place] and was informed that patient is not eligible for this In [place] program as he is not 53 years of age yet. LCSW will continue to investigate other in home support resources for patient and will follow up with patient at next scheduled appointment.   Review of patient status, including review of consultants reports, other relevant assessments, and collaboration with appropriate care team members and the patient's provider was performed as part of comprehensive patient evaluation and provision of chronic care management services.     Follow Up Plan: SW will follow up with patient by phone over the next 2 weeks  [name], [place], MSW, [email or web address] Phone: [phone]

## 2018-06-13 NOTE — Patient Instructions (Signed)
Thank you allowing the Chronic Care Management Team to be a part of your care! It was a pleasure speaking with you today!  CCM (Chronic Care Management) Team   Yanet Balliet RN, BSN Nurse Care Coordinator  [phone]  [name] PharmD  Clinical Pharmacist  [phone]  [name] LCSW Clinical Social Worker [phone]  Goals Addressed            This Visit's Progress   . I don't want to catch the Corona Virus (pt-stated)       Current Barriers:  [name] Kitchen Knowledge Deficits related to COVID-19 and impact on patient self health management  Clinical Goal(s):  [name] Kitchen Over the next 30 days, patient will verbalize basic understanding of COVID-19 impact on individual health and self health management as evidenced by verbalization of basic understanding of COVID-19 as a viral disease, measures to prevent exposure, signs and symptoms, when to contact provider  Interventions: . Advised patient to wear mask when in public, wash hands frequently and do not touch his face . Provided education to patient re: Covid-19 . Discussed plans with patient for ongoing care management follow up and provided patient with direct contact information for care management team  Patient Self Care Activities:  . Calls provider office for new concerns or questions  Initial goal documentation                      The patient verbalized understanding of instructions provided today and declined a print copy of patient instruction materials.   The CM team will reach out to the patient again over the next 14 days.  The patient has been provided with contact information for the chronic care management team and has been advised to call with any health related questions or concerns.    For information about COVID-19 or "Corona Virus", the following web resources may be helpful:  CDC: BeginnerSteps.be    [place]:   [email or web address]

## 2018-06-16 ENCOUNTER — Ambulatory Visit: Payer: Self-pay | Admitting: Pharmacist

## 2018-06-16 ENCOUNTER — Other Ambulatory Visit: Payer: Self-pay | Admitting: Family Medicine

## 2018-06-16 DIAGNOSIS — I129 Hypertensive chronic kidney disease with stage 1 through stage 4 chronic kidney disease, or unspecified chronic kidney disease: Secondary | ICD-10-CM

## 2018-06-16 DIAGNOSIS — M1A39X0 Chronic gout due to renal impairment, multiple sites, without tophus (tophi): Secondary | ICD-10-CM

## 2018-06-16 DIAGNOSIS — IMO0002 Reserved for concepts with insufficient information to code with codable children: Secondary | ICD-10-CM

## 2018-06-16 DIAGNOSIS — M10339 Gout due to renal impairment, unspecified wrist: Secondary | ICD-10-CM

## 2018-06-16 DIAGNOSIS — Z0000 Encounter for general adult medical examination without abnormal findings: Secondary | ICD-10-CM

## 2018-06-16 DIAGNOSIS — Z125 Encounter for screening for malignant neoplasm of prostate: Secondary | ICD-10-CM

## 2018-06-16 DIAGNOSIS — E1169 Type 2 diabetes mellitus with other specified complication: Secondary | ICD-10-CM

## 2018-06-16 DIAGNOSIS — N184 Chronic kidney disease, stage 4 (severe): Secondary | ICD-10-CM

## 2018-06-16 DIAGNOSIS — E1122 Type 2 diabetes mellitus with diabetic chronic kidney disease: Secondary | ICD-10-CM

## 2018-06-16 DIAGNOSIS — I5022 Chronic systolic (congestive) heart failure: Secondary | ICD-10-CM

## 2018-06-16 DIAGNOSIS — E785 Hyperlipidemia, unspecified: Secondary | ICD-10-CM

## 2018-06-16 NOTE — Patient Instructions (Addendum)
Thank you allowing the Chronic Care Management Team to be a part of your care! It was a pleasure speaking with you today!     CCM (Chronic Care Management) Team    Janci Minor RN, BSN Nurse Care Coordinator  [phone]   [name] PharmD  Clinical Pharmacist  [phone]   [name] LCSW Clinical Social Worker [phone]  Visit Information  Goals Addressed            This Visit's Progress   . Medication Management       Current Barriers:  . Financial Barriers  Pharmacist Clinical Goal(s):  [name] Kitchen Over the next 30 days, patient will work with CM Pharmacist to address needs related to medication therapy optimization  Interventions:  Perform chart review  Per note with A1C lab from 5/15, provider advised patient:    "Here is A1c result. Hemoglobin A1c 8.7, it is higher than last time 8.1 I would   recommend keep increasing Insulin dose if fasting blood sugar >150 on average every   5-7 days, can go up by 2 units. Also we can consider adding back one of those other   injectable medications, such as Ozempic. You have tried Trulicity and Bydureon in   past."  Inquire about patient's recent home blood sugars and Basaglar dose  Reports he has not been checking fasting blood sugars  Reports he has been taking Basaglar 15 units once daily. Reports that he had previously self-increased his Basaglar dose to 20 units daily, but that he decreased it back down to 15 units daily due to afternoon dizziness. Patient unable to tell me what he blood sugar was at the time of these episodes  Counsel patient to start checking daily fasting blood sugars  Discuss diabetes therapy options as recommended by PCP. Patient reports that he would be interested in re-trying Trulicity. Reports that he was non-adherent to this therapy option in the past due to cost of the medication.   Note that patient would be eligible for patient assistance for Trulicity through the manufacturer  (currently enrolled in this same manufacturer program for [employer]).  Will collaborate with PCP regarding patient's diabetes medication management.  Patient Self Care Activities:  . Self administers medications as prescribed . Attends all scheduled provider appointments . Calls pharmacy for medication refills . Calls provider office for new concerns or questions  . Patient to check daily blood sugars, including fasting blood sugars, and keep log  Initial goal documentation        The patient verbalized understanding of instructions provided today and declined a print copy of patient instruction materials.   The CM team will reach out to the patient again over the next 7 days.   [name], PharmD, [place] [employer] [phone]

## 2018-06-16 NOTE — Chronic Care Management (AMB) (Signed)
Chronic Care Management   Follow Up Note   06/16/2018 Name: CASTULO SCARPELLI MRN: [id number] DOB: 1965-06-27  Referred by: [name], DO Reason for referral : No chief complaint on file.   TATEN MERROW is a 53 y.o. year old male who is a primary care patient of [name], DO. The CCM team was consulted for assistance with chronic disease management and care coordination needs.  Mr. Chatmon has a past medical history including but not limited to Type 2 diabetes, hypertension, CKD, chronic systolic heart failure, gout, seasonal allergies and GERD.  I reached out to [name] by phone today to follow up regarding his diabetes management  Review of patient status, including review of consultants reports, relevant laboratory and other test results, and collaboration with appropriate care team members and the patient's provider was performed as part of comprehensive patient evaluation and provision of chronic care management services.    Objective  Lab Results  Component Value Date   CREATININE 2.21 (H) 03/05/2018   CREATININE 3.07 (H) 03/04/2018   CREATININE 4.41 (H) 03/03/2018    Lab Results  Component Value Date   HGBA1C 8.7 (H) 06/10/2018   Lab Results  Component Value Date   CHOL 115 10/13/2017   HDL 49 10/13/2017   LDLCALC 39 10/13/2017   TRIG 208 (H) 10/13/2017   CHOLHDL 2.3 10/13/2017     Goals Addressed            This Visit's Progress    Medication Management       Current Barriers:   Financial Barriers  Pharmacist Clinical Goal(s):   Over the next 30 days, patient will work with CM Pharmacist to address needs related to medication therapy optimization  Interventions:  Perform chart review  Per note with A1C lab from 5/15, provider advised patient:    Here is A1c result. Hemoglobin A1c 8.7, it is higher than last time 8.1 I would   recommend keep increasing Insulin dose if fasting blood sugar >150 on average  every   5-7 days, can go up by 2 units. Also we can consider adding back one of those other   injectable medications, such as Ozempic. You have tried Trulicity and Bydureon in   past.  Inquire about patients recent home blood sugars and Basaglar dose  Patient reports that he does not have his blood glucose numbers with him at this time, but that he believes his most recent blood sugar was ~160 mg/dL (non-fasting)  Reports he has not been checking fasting blood sugars  Reports he has been taking Basaglar 15 units once daily. Reports that he had previously self-increased his Basaglar dose to 20 units daily, but that he decreased it back down to 15 units daily due to afternoon dizziness. Patient unable to tell me what he blood sugar was at the time of these episodes  Counsel patient to start checking daily fasting blood sugars  Discuss diabetes therapy options as recommended by PCP. Patient reports that he would be interested in re-trying Trulicity. Reports that he was non-adherent to this therapy option in the past due to cost of the medication.   Note that patient would be eligible for patient assistance for Trulicity through the manufacturer (currently enrolled in this same manufacturer program for [employer]).  Will collaborate with PCP regarding patient's diabetes medication management.  Patient Self Care Activities:   Self administers medications as prescribed  Attends all scheduled provider appointments  Calls pharmacy for medication  refills  Calls provider office for new concerns or questions   Patient to check daily blood sugars, including fasting blood sugars, and keep log  Initial goal documentation        Plan  CM Pharmacist will reach out to the patient again over the next 7 days.   [name], PharmD, [place] [employer] [phone]

## 2018-06-22 ENCOUNTER — Ambulatory Visit (INDEPENDENT_AMBULATORY_CARE_PROVIDER_SITE_OTHER): Payer: PPO | Admitting: Pharmacist

## 2018-06-22 DIAGNOSIS — E1165 Type 2 diabetes mellitus with hyperglycemia: Secondary | ICD-10-CM | POA: Diagnosis not present

## 2018-06-22 DIAGNOSIS — E1122 Type 2 diabetes mellitus with diabetic chronic kidney disease: Secondary | ICD-10-CM | POA: Diagnosis not present

## 2018-06-22 DIAGNOSIS — Z794 Long term (current) use of insulin: Secondary | ICD-10-CM

## 2018-06-22 DIAGNOSIS — N184 Chronic kidney disease, stage 4 (severe): Secondary | ICD-10-CM | POA: Diagnosis not present

## 2018-06-22 DIAGNOSIS — IMO0002 Reserved for concepts with insufficient information to code with codable children: Secondary | ICD-10-CM

## 2018-06-22 NOTE — Patient Instructions (Addendum)
Thank you allowing the Chronic Care Management Team to be a part of your care! It was a pleasure speaking with you today!     CCM (Chronic Care Management) Team    Janci Minor RN, BSN Nurse Care Coordinator  [phone]   [name] PharmD  Clinical Pharmacist  [phone]   [name] LCSW Clinical Social Worker [phone]  Visit Information  Goals Addressed            This Visit's Progress   . Medication Management       Current Barriers:  . Financial Barriers  . Lack of blood sugar readings for clinical team  Pharmacist Clinical Goal(s):  [name] Kitchen Over the next 30 days, patient will work with CM Pharmacist to address needs related to medication therapy optimization  Interventions:  Collaborated with PCP regarding patient's diabetes medication management. Let provider know that patient has expressed interest in re-trying Trulicity and that patient is eligible for patient assistance for this medication through [place]  Follow up call to patient regarding his blood sugar monitoring and diabetes management  Mr. Mceachin reports starting to check his fasting blood sugar on 5/24 (results below)  Patient reports checking blood sugar at other times of the day, but declines to provide numbers stating that there are too many  Patient reports self-adjusting his Basaglar insulin down to 10 units each morning starting on 5/25 due to afternoon episodes of sweating and dizziness that he attributed to low blood sugar. Patient denies checking blood sugar around the time of these lows.  Counsel patient regarding management of low blood sugars including the importance of checking blood sugar.  Advise patient to also follow up with his Cardiologist regarding these episodes as patient states that he has had episodes of dizziness in the past related to his blood pressure. However, patient insists that these episodes are different and not serious enough to talk about with either his  Cardiologist or his PCP. Upon further discussion, Mr. Schuenemann states that these episodes only happen if he gets overheated and states that he has discussed this with his Cardiologist in the past. Reports that he does not believe that it was related to his blood sugar.  Advise patient to avoid situations in which he could become overheated  Counsel patient again to call Cardiologist and PCP if dizziness happens again and about checking blood sugar if he believes that his blood sugar is low.  Advise patient to follow the directions for management of his blood sugar as were provided to him by his PCP via [place] patient phone number for 24-hour nurse advice line, as requested  Patient Self Care Activities:  . Self administers medications . Attends all scheduled provider appointments . Calls pharmacy for medication refills . Calls provider office for new concerns or questions  . Patient to check daily blood sugars, including fasting blood sugars, and keep log Date Fasting Blood Glucose Notes  5/24 190   5/25 160 *Decreased Basaglar dose to 10 units QAM  5/26 166   5/27 -    Please see past updates related to this goal by clicking on the "Past Updates" button in the selected goal         The patient verbalized understanding of instructions provided today and declined a print copy of patient instruction materials.   The CM team will reach out to the patient again over the next 7 days.   [name], PharmD, [place] [phone]

## 2018-06-22 NOTE — Chronic Care Management (AMB) (Signed)
Chronic Care Management   Follow Up Note   06/22/2018 Name: [name] MRN: [id number] DOB: 1965/09/16  Referred by: [name], DO Reason for referral : Chronic Care Management (Patient Phone Call)   [name] is a 53 y.o. year old male who is a primary care patient of [name], DO. The CCM team was consulted for assistance with chronic disease management and care coordination needs.  [name] has a past medical history including but not limited to Type 2 diabetes, hypertension, CKD, chronic systolic heart failure, gout, seasonal allergies and GERD.  I reached out to [name] by phone today to follow up regarding his diabetes management  Review of patient status, including review of consultants reports, relevant laboratory and other test results, and collaboration with appropriate care team members and the patient's provider was performed as part of comprehensive patient evaluation and provision of chronic care management services.    Goals Addressed            This Visit's Progress   . Medication Management       Current Barriers:  . Financial Barriers  . Lack of blood sugar readings for clinical team  Pharmacist Clinical Goal(s):  [name] Kitchen Over the next 30 days, patient will work with CM Pharmacist to address needs related to medication therapy optimization  Interventions:  Collaborated with PCP regarding patient's diabetes medication management. Let provider know that patient has expressed interest in re-trying Trulicity and that patient is eligible for patient assistance for this medication through [place] message back from Dr. Parks Ranger that he will send prescription for Trulicity 4.68 mg weekly to [place] Pharmacist to follow up with [facility] (dispensiing pharmacy) later in the week  Follow up call to patient regarding his blood sugar monitoring and diabetes management  [name] reports starting  to check his fasting blood sugar on 5/24 (results below)  Patient reports checking blood sugar at other times of the day, but declines to provide numbers stating that there are too many  Patient reports self-adjusting his Basaglar insulin down to 10 units each morning starting on 5/25 due to afternoon episodes of sweating and dizziness that he attributed to low blood sugar. Patient denies checking blood sugar around the time of these lows.  Counsel patient regarding management of low blood sugars including the importance of checking blood sugar.  Advise patient to also follow up with his Cardiologist regarding these episodes as patient states that he has had episodes of dizziness in the past related to his blood pressure. However, patient insists that these episodes are different and not serious enough to talk about with either his Cardiologist or his PCP. Upon further discussion, [name] states that these episodes only happen if he gets overheated and states that he has discussed this with his Cardiologist in the past. Reports that he does not believe that it was related to his blood sugar.  Advise patient to avoid situations in which he could become overheated  Counsel patient again to call Cardiologist and PCP if dizziness happens again and about checking blood sugar if he believes that his blood sugar is low.  Advise patient to follow the directions for management of his blood sugar as were provided to him by his PCP via MyChart if his fasting blood sugar average remains >150 mg/dL  Send patient phone number for 24-hour nurse advice line, as requested  Patient Self Care Activities:  . Self administers  medications . Attends all scheduled provider appointments . Calls pharmacy for medication refills . Calls provider office for new concerns or questions  . Patient to check daily blood sugars, including fasting blood sugars, and keep log Date Fasting Blood Glucose Notes  5/24 190   5/25  160 *Decreased Basaglar dose to 10 units QAM  5/26 166   5/27 -    Please see past updates related to this goal by clicking on the "Past Updates" button in the selected goal           Plan  1) CM Pharmacist to follow up with [facility] (dispensiing pharmacy) in the next 2 days 2) CM Pharmacist to follow up with patient via telephone in the next 7 days  [name], PharmD, [place] [phone]

## 2018-06-23 ENCOUNTER — Telehealth: Payer: Self-pay

## 2018-06-23 ENCOUNTER — Ambulatory Visit: Payer: Self-pay | Admitting: Licensed Clinical Social Worker

## 2018-06-23 ENCOUNTER — Other Ambulatory Visit: Payer: Self-pay

## 2018-06-23 DIAGNOSIS — Z794 Long term (current) use of insulin: Secondary | ICD-10-CM | POA: Diagnosis not present

## 2018-06-23 DIAGNOSIS — E1122 Type 2 diabetes mellitus with diabetic chronic kidney disease: Secondary | ICD-10-CM

## 2018-06-23 DIAGNOSIS — E1165 Type 2 diabetes mellitus with hyperglycemia: Secondary | ICD-10-CM | POA: Diagnosis not present

## 2018-06-23 DIAGNOSIS — N184 Chronic kidney disease, stage 4 (severe): Secondary | ICD-10-CM | POA: Diagnosis not present

## 2018-06-23 DIAGNOSIS — IMO0002 Reserved for concepts with insufficient information to code with codable children: Secondary | ICD-10-CM

## 2018-06-23 NOTE — Chronic Care Management (AMB) (Signed)
  Chronic Care Management    Clinical Social Work General Note  06/23/2018 Name: [name] MRN: [id number] DOB: Dec 17, 1965  [name] is a 53 y.o. year old male who is a primary care patient of [name], DO. The CCM was consulted to assist the patient with [employer].   Review of patient status, including review of consultants reports, relevant laboratory and other test results, and collaboration with appropriate care team members and the patient's provider was performed as part of comprehensive patient evaluation and provision of chronic care management services.    Goals Addressed    . "I want to know what resources are available to me" (pt-stated)       Current Barriers:  . Financial constraints . Limited social support . Limited access to food . ADL IADL limitations . Social Isolation . Limited access to caregiver . Lacks knowledge of community resource: financial assistance resources and personal care service resources that are available to him   Clinical Social Work Clinical Goal(s):  [name] Kitchen Over the next 90 days, patient will work with LCSW to address needs related to lack of support within the home . Over the next 90 days, client will follow up with financial assistance programs/resources* as directed by SW  Interventions: . Patient interviewed and appropriate assessments performed . Provided patient with information about crisis support and financial support resources within [facility] . Patient reports receiving notice that he is eligible for food stamps but has not received EBT card in the mail yet.  . Patient was advised to contact DSS to check on food stamps status  . Discussed plans with patient for ongoing care management follow up and provided patient with direct contact information for care management team . Advised patient to check email for community resources that were sent out on 05/26/2018. Patient confirmed receiving resources  and education was provided again on these various resource options . Collaborated with Museum/gallery curator (community agency) re: to check and see if patient is eligible for [employer] program through [place]. Patient IS NOT eligible for program as he is not 53 years of age yet. Patient reports making too much to qualify for [facility] as well. Patient denies wanting to privately pay for an aide at this time and reports "I want to know what options are available to me for free." . Assisted patient/caregiver with obtaining information about health plan benefits  Patient Self Care Activities:  . Attends all scheduled provider appointments . Calls provider office for new concerns or questions  Please see past updates related to this goal by clicking on the "Past Updates" button in the selected goal      Follow Up Plan: SW will follow up with patient by phone over the next 2-4 weeks       [name], BSW, MSW, [email or web address] Phone: [phone]

## 2018-06-27 ENCOUNTER — Ambulatory Visit: Payer: Self-pay | Admitting: Pharmacist

## 2018-06-27 DIAGNOSIS — IMO0002 Reserved for concepts with insufficient information to code with codable children: Secondary | ICD-10-CM

## 2018-06-27 DIAGNOSIS — E1122 Type 2 diabetes mellitus with diabetic chronic kidney disease: Secondary | ICD-10-CM

## 2018-06-27 NOTE — Patient Instructions (Signed)
Thank you allowing the Chronic Care Management Team to be a part of your care! It was a pleasure speaking with you today!     CCM (Chronic Care Management) Team    Janci Minor RN, BSN Nurse Care Coordinator  [phone]   [name] PharmD  Clinical Pharmacist  [phone]   [name] LCSW Clinical Social Worker [phone]  Visit Information  Goals Addressed            This Visit's Progress   . Medication Management       Current Barriers:  . Financial Barriers  . Lack of blood sugar readings for clinical team  Pharmacist Clinical Goal(s):  [name] Kitchen Over the next 30 days, patient will work with CM Pharmacist to address needs related to medication therapy optimization  Interventions:  Follow up with [facility] (dispensing pharmacy for [facility]) to confirm that pharmacy received Trulicity information for patient and verify details of prescription. Speak with Levada Dy at [facility], who confirms that the Trulicity 3.52 mg weekly prescription, as prescribed by PCP, will be mailed to the office for the patient.  Patient Self Care Activities:  . Self administers medications . Attends all scheduled provider appointments . Calls pharmacy for medication refills . Calls provider office for new concerns or questions  . Patient to check daily blood sugars, including fasting blood sugars, and keep log  Please see past updates related to this goal by clicking on the "Past Updates" button in the selected goal         The patient verbalized understanding of instructions provided today and declined a print copy of patient instruction materials.   The care management team will reach out to the patient again over the next 7 days.   [name], PharmD, [place] [employer] [phone]

## 2018-06-27 NOTE — Chronic Care Management (AMB) (Signed)
  Chronic Care Management   Follow Up Note   06/27/2018 Name: OCTAVIAN GODEK MRN: [id number] DOB: 12-23-1965  Referred by: [name], DO Reason for referral : Care Coordination (Patient Assistance Program)   MIA WINTHROP is a 53 y.o. year old male who is a primary care patient of [name], DO. The CCM team was consulted for assistance with chronic disease management and care coordination needs. Mr. Hannis has a past medical history including but not limited to Type 2 diabetes, hypertension, CKD, chronic systolic heart failure, gout, seasonal allergies and GERD.  I reached out to [facility] [employer] [facility] [employer]) and speak with Levada Dy, [place].  Review of patient status, including review of consultants reports, relevant laboratory and other test results, and collaboration with appropriate care team members and the patient's provider was performed as part of comprehensive patient evaluation and provision of chronic care management services.    Goals Addressed            This Visit's Progress   . Medication Management       Current Barriers:  . Financial Barriers  . Lack of blood sugar readings for clinical team  Pharmacist Clinical Goal(s):  [name] Kitchen Over the next 30 days, patient will work with CM Pharmacist to address needs related to medication therapy optimization  Interventions:  Follow up with [facility] (dispensing pharmacy for [facility]) to confirm that pharmacy received Trulicity information for patient and verify details of prescription. Speak with Levada Dy at [facility], who confirms that the Trulicity 1.27 mg weekly prescription, as prescribed by PCP, will be mailed to the office for the patient.  Patient Self Care Activities:  . Self administers medications . Attends all scheduled provider appointments . Calls pharmacy for medication refills . Calls provider office for new concerns or questions  . Patient to check  daily blood sugars, including fasting blood sugars, and keep log  Please see past updates related to this goal by clicking on the "Past Updates" button in the selected goal        Plan   The care management team will reach out to the patient again over the next 7 days.   [name], PharmD, [place] [employer] [phone]

## 2018-06-29 ENCOUNTER — Ambulatory Visit: Payer: PPO | Admitting: Pharmacist

## 2018-06-29 DIAGNOSIS — E1122 Type 2 diabetes mellitus with diabetic chronic kidney disease: Secondary | ICD-10-CM

## 2018-06-29 DIAGNOSIS — IMO0002 Reserved for concepts with insufficient information to code with codable children: Secondary | ICD-10-CM

## 2018-06-29 NOTE — Chronic Care Management (AMB) (Signed)
  Chronic Care Management   Follow Up Note   06/29/2018 Name: [name] MRN: [id number] DOB: Feb 17, 1965  Referred by: Darren Hauser, DO Reason for referral : Chronic Care Management (Patient Phone Call)   [name] is a 53 y.o. year old male who is a primary care patient of Darren Hauser, DO. The CCM team was consulted for assistance with chronic disease management and care coordination needs.  [name] has a past medical history including but not limited to Type 2 diabetes, hypertension, CKD, chronic systolic heart failure, gout, seasonal allergies and GERD.  I reached out to [name] by phone today to follow up regarding his diabetes management and medication assistance.  Review of patient status, including review of consultants reports, relevant laboratory and other test results, and collaboration with appropriate care team members and the patient's provider was performed as part of comprehensive patient evaluation and provision of chronic care management services.    Goals Addressed            This Visit's Progress   . Medication Management       Current Barriers:  . Financial Barriers  . Lack of blood sugar readings for clinical team  Pharmacist Clinical Goal(s):  [name] Kitchen Over the next 30 days, patient will work with CM Pharmacist to address needs related to medication therapy optimization  Interventions:  Follow up call to patient regarding his blood sugar monitoring and diabetes management  Receive fasting blood sugar results from patient (below), which he has recorded.Reports nighttime readings are lower, but has not recorded these results  Counsel patient to record these results  Reports self-adjusting his Basaglar insulin to 13 units each morning   Counsel patient again regarding signs/symptoms of low blood sugars, management of lows including the importance of checking blood sugar with these symptoms.  Provide patient with  counseling regarding diabetes and nutrition, including information about lower glycemic fruit options  Will mail patient a blood pressure log, as requested  Reports improvement in his afternoon episodes of sweating/dizziness with avoiding allowing himself to become overheated.   Medication Assistance: Followed up with [facility] on 6/[facility] (dispensing pharmacy for [employer]) to confirm that pharmacy received Trulicity 1.01 mg prescription for patient and medication will be mailed to the office for the patient  Patient Self Care Activities:  . Self administers medications . Attends all scheduled provider appointments . Calls pharmacy for medication refills . Calls provider office for new concerns or questions  . Patient to check daily blood sugars, including fasting blood sugars, and keep log Date Fasting Morning Blood Glucose  29 - May 194  30 - May 180  31 - May 190  1 - June 183  2 - June 162  3 - June 196  Average 184   Please see past updates related to this goal by clicking on the "Past Updates" button in the selected goal        Plan   Telephone follow up appointment with care management team member scheduled for:6/10 at 1 pm  [name], PharmD, [place] [phone]

## 2018-06-29 NOTE — Patient Instructions (Signed)
Thank you allowing the Chronic Care Management Team to be a part of your care! It was a pleasure speaking with you today!     CCM (Chronic Care Management) Team    Janci Minor RN, BSN Nurse Care Coordinator  [phone]   [name] PharmD  Clinical Pharmacist  [phone]   [name] LCSW Clinical Social Worker [phone]  Visit Information  Goals Addressed            This Visit's Progress   . Medication Management       Current Barriers:  . Financial Barriers  . Lack of blood sugar readings for clinical team  Pharmacist Clinical Goal(s):  [name] Kitchen Over the next 30 days, patient will work with CM Pharmacist to address needs related to medication therapy optimization  Interventions:  Follow up call to patient regarding his blood sugar monitoring and diabetes management  Receive fasting blood sugar results from patient (below), which he has recorded.Reports nighttime readings are lower, but has not recorded these results  Counsel patient to record these results  Reports self-adjusting his Basaglar insulin to 13 units each morning   Counsel patient again regarding signs/symptoms of low blood sugars, management of lows including the importance of checking blood sugar with these symptoms.  Provide patient with counseling regarding diabetes and nutrition, including information about lower glycemic fruit options  Will mail patient a blood pressure log, as requested  Reports improvement in his afternoon episodes of sweating/dizziness with avoiding allowing himself to become overheated.   Medication Assistance: Followed up with [facility] on [facility] (dispensing pharmacy for [employer]) to confirm that pharmacy received Trulicity 2.39 mg prescription for patient and medication will be mailed to the office for the patient  Patient Self Care Activities:  . Self administers medications . Attends all scheduled provider appointments . Calls pharmacy for medication  refills . Calls provider office for new concerns or questions  . Patient to check daily blood sugars, including fasting blood sugars, and keep log Date Fasting Morning Blood Glucose  29 - May 194  30 - May 180  31 - May 190  1 - June 183  2 - June 162  3 - June 196  Average 184   Please see past updates related to this goal by clicking on the "Past Updates" button in the selected goal         The patient verbalized understanding of instructions provided today and declined a print copy of patient instruction materials.   Telephone follow up appointment with care management team member scheduled for: 6/10 at 1 pm  [name], PharmD, [place] [phone]

## 2018-07-04 ENCOUNTER — Telehealth: Payer: Self-pay

## 2018-07-06 ENCOUNTER — Ambulatory Visit (INDEPENDENT_AMBULATORY_CARE_PROVIDER_SITE_OTHER): Payer: PPO | Admitting: Pharmacist

## 2018-07-06 ENCOUNTER — Ambulatory Visit: Payer: Self-pay | Admitting: Licensed Clinical Social Worker

## 2018-07-06 ENCOUNTER — Telehealth: Payer: Self-pay

## 2018-07-06 DIAGNOSIS — Z794 Long term (current) use of insulin: Secondary | ICD-10-CM | POA: Diagnosis not present

## 2018-07-06 DIAGNOSIS — IMO0002 Reserved for concepts with insufficient information to code with codable children: Secondary | ICD-10-CM

## 2018-07-06 DIAGNOSIS — N184 Chronic kidney disease, stage 4 (severe): Secondary | ICD-10-CM | POA: Diagnosis not present

## 2018-07-06 DIAGNOSIS — E1122 Type 2 diabetes mellitus with diabetic chronic kidney disease: Secondary | ICD-10-CM | POA: Diagnosis not present

## 2018-07-06 DIAGNOSIS — E1165 Type 2 diabetes mellitus with hyperglycemia: Secondary | ICD-10-CM

## 2018-07-06 DIAGNOSIS — I5022 Chronic systolic (congestive) heart failure: Secondary | ICD-10-CM | POA: Diagnosis not present

## 2018-07-06 NOTE — Telephone Encounter (Signed)
See previous message

## 2018-07-06 NOTE — Chronic Care Management (AMB) (Signed)
  Care Management Note   [name] is a 53 y.o. year old male who is a primary care patient of [name], DO. The CM team was consulted for assistance with chronic disease management and care coordination.   I reached out to [name] by phone today to follow up on food stamps. LCSW completed CCM outreach attempt today but was unable to reach patient successfully. A HIPPA compliant voice message was left encouraging patient to return call once available. LCSW rescheduled CCM SW appointment as well.   Review of patient status, including review of consultants reports, relevant laboratory and other test results, and collaboration with appropriate care team members and the patient's provider was performed as part of comprehensive patient evaluation and provision of chronic care management services.   Follow Up Plan: The care management team will reach out to the patient again over the next 30 days.   [name], BSW, MSW, [email or web address] Phone: [phone]

## 2018-07-06 NOTE — Chronic Care Management (AMB) (Signed)
Chronic Care Management   Follow Up Note   07/06/2018 Name: [name] MRN: [id number] DOB: 05-Sep-1965  Referred by: [name], DO Reason for referral : Chronic Care Management (Patient Phone Call)   [name] is a 53 y.o. year old male who is a primary care patient of [name], DO. The CCM team was consulted for assistance with chronic disease management and care coordination needs.  [name] has a past medical history including but not limited to Type 2 diabetes, hypertension, CKD, chronic systolic heart failure, gout, seasonal allergies and GERD.  I reached out to [name] by phone today to follow up regarding his diabetes management and medication assistance.  Review of patient status, including review of consultants reports, relevant laboratory and other test results, and collaboration with appropriate care team members and the patient's provider was performed as part of comprehensive patient evaluation and provision of chronic care management services.    Goals Addressed            This Visit's Progress   . Medication Management       Current Barriers:  . Financial Barriers  . Limited blood sugar readings for clinical team  Pharmacist Clinical Goal(s):  [name] Kitchen Over the next 30 days, patient will work with CM Pharmacist to address needs related to medication therapy optimization  Interventions:  Follow up call to patient regarding his blood sugar monitoring and diabetes management  Receive fasting blood sugar results from patient (below), which he has recorded. Reports checking blood sugars more often, but has not recorded all results  Counsel patient to record these results  Reports self-adjusting his Basaglar insulin on Sunday, as advised per limits from PCP, to 10 units each morning. Reports having a stomach virus on Sunday that resolved yesterday.  Counsel patient about sick day rules including checking blood sugar  and more closely and contacting his providers if needed  Reports receiving and using the new blood sugar log that I mailed  Counsel patient again about following guidance from Cardiology for episodes of sweating/dizziness and avoiding allowing himself to become overheated - to follow up with Cardiology for related concerns  Patient reports that he is weighing himself daily as directed  Reports following fluid guidance from Cardiologist  Medication Assistance: Followed up with [facility] on 6/[facility] (dispensing pharmacy for [facility]) to confirm that pharmacy received Trulicity 7.09 mg prescription for patient and medication will be mailed to the office for the patient  [name]  Patient Self Care Activities:  . Self administers medications . Attends all scheduled provider appointments . Calls pharmacy for medication refills . Calls provider office for new concerns or questions  . Patient to check daily blood sugars, including fasting blood sugars, and keep log  Date Fasting Morning Blood Glucose Bedtime Notes  4 - June 193    5 - June 196    6 - June 132 142   7 - June 137  *Stomach virus Lowered Basaglar to 10 units daily  8 - June -    9 - June -    10 - June 136  *Virus resolved   Please see past updates related to this goal by clicking on the "Past Updates" button in the selected goal         Plan   The care management team will reach out to the patient again over the next 7 days.   [name], PharmD, BCACP Clinical  Pharmacist [place] [employer] [phone]

## 2018-07-06 NOTE — Patient Instructions (Signed)
Thank you allowing the Chronic Care Management Team to be a part of your care! It was a pleasure speaking with you today!     CCM (Chronic Care Management) Team    Janci Minor RN, BSN Nurse Care Coordinator  [phone]   [name] PharmD  Clinical Pharmacist  [phone]   [name] LCSW Clinical Social Worker [phone]  Visit Information  Goals Addressed            This Visit's Progress   . Medication Management       Current Barriers:  . Financial Barriers  . Limited blood sugar readings for clinical team  Pharmacist Clinical Goal(s):  [name] Kitchen Over the next 30 days, patient will work with CM Pharmacist to address needs related to medication therapy optimization  Interventions:  Follow up call to patient regarding his blood sugar monitoring and diabetes management  Receive fasting blood sugar results from patient (below), which he has recorded. Reports checking blood sugars more often, but has not recorded all results  Counsel patient to record these results  Reports self-adjusting his Basaglar insulin on Sunday, as advised per limits from PCP, to 10 units each morning. Reports having a stomach virus on Sunday that resolved yesterday.  Counsel patient about sick day rules including checking blood sugar and more closely and contacting his providers if needed  Reports receiving and using the new blood sugar log that I mailed  Counsel patient again about following guidance from Cardiology for episodes of sweating/dizziness and avoiding allowing himself to become overheated - to follow up with Cardiology for related concerns  Patient reports that he is weighing himself daily as directed  Reports following fluid guidance from Cardiologist  Medication Assistance: Followed up with [facility] on 6/[facility] (dispensing pharmacy for [facility]) to confirm that pharmacy received Trulicity 7.41 mg prescription for patient and medication will be mailed to the  office for the patient  Let Mr. Patry know  Patient Self Care Activities:  . Self administers medications . Attends all scheduled provider appointments . Calls pharmacy for medication refills . Calls provider office for new concerns or questions  . Patient to check daily blood sugars, including fasting blood sugars, and keep log  Date Fasting Morning Blood Glucose Bedtime Notes  4 - June 193    5 - June 196    6 - June 132 142   7 - June 137  *Stomach virus Lowered Basaglar to 10 units daily  8 - June -    9 - June -    10 - June 136  *Reports virus resolved   Please see past updates related to this goal by clicking on the "Past Updates" button in the selected goal         The patient verbalized understanding of instructions provided today and declined a print copy of patient instruction materials.   The care management team will reach out to the patient again over the next 7 days.   [name], PharmD, [place] [employer] [phone]

## 2018-07-06 NOTE — Telephone Encounter (Signed)
Called patient regarding picking up Rx for trulicity that we have received but his question was does he needs to be on both med's trulicity and basaglar by staying on both medication makes patient dizzy. Please suggest.

## 2018-07-06 NOTE — Telephone Encounter (Signed)
Sent a response to [name] [facility] who is working with patient on this medication issue.  My goal is to continue Trulicity and keep tapering down Basaglar, he may be able to completely come off basaglar in the future.  I asked if Grayland Ormond can reach him to discuss further with a plan to transition the meds, and notify me if she needs me to call him.  [name], [facility] 07/06/2018, 6:01 PM

## 2018-07-07 ENCOUNTER — Ambulatory Visit: Payer: Self-pay | Admitting: *Deleted

## 2018-07-07 ENCOUNTER — Ambulatory Visit: Payer: Self-pay | Admitting: Pharmacist

## 2018-07-07 ENCOUNTER — Telehealth: Payer: Self-pay

## 2018-07-07 DIAGNOSIS — IMO0002 Reserved for concepts with insufficient information to code with codable children: Secondary | ICD-10-CM

## 2018-07-07 DIAGNOSIS — E1122 Type 2 diabetes mellitus with diabetic chronic kidney disease: Secondary | ICD-10-CM

## 2018-07-07 NOTE — Chronic Care Management (AMB) (Signed)
  Chronic Care Management   Outreach Note  07/07/2018 Name: [name] MRN: [id number] DOB: 16-May-1965  Referred by: [name], DO Reason for referral : No chief complaint on file.   An unsuccessful telephone outreach was attempted today. The patient was referred to the case management team by for assistance with chronic care management and care coordination.   Follow Up Plan: A HIPPA compliant phone message was left for the patient providing contact information and requesting a return call.  The care management team will reach out to the patient again over the next 14 days.    Merlene Morse Amilyah Nack RN, BSN Nurse Case Pharmacist, [facility] Care Management  [phone]) Business Mobile

## 2018-07-08 NOTE — Patient Instructions (Signed)
Thank you allowing the Chronic Care Management Team to be a part of your care! It was a pleasure speaking with you today!     CCM (Chronic Care Management) Team    Janci Minor RN, BSN Nurse Care Coordinator  [phone]   [name] PharmD  Clinical Pharmacist  [phone]   [name] LCSW Clinical Social Worker [phone]  Visit Information  Goals Addressed            This Visit's Progress   . Medication Management       Current Barriers:  . Financial Barriers  . Limited blood sugar readings for clinical team  Pharmacist Clinical Goal(s):  [name] Kitchen Over the next 30 days, patient will work with CM Pharmacist to address needs related to medication therapy optimization  Interventions:  Collaborate with PCP. Patient picked up Patient Assistance Program Trulicity 5.46 mg weekly yesterday that had been shipped to office. Patient needing follow up call   Follow up call to patient regarding his blood sugar monitoring and diabetes medication management  Counsel patient again to continue to follow the directions from PCP for self-adjustment of his Basaglar based on his fasting blood sugar results as he starts the Trulicity.   [name] verbalizes understanding of this plan, while also expressing hope to only require Trulicity in the future to control his blood sugar  Patient denies need for counseling on administration of Trulicity as he has previously used this medication  Counsel patient again about following reported guidance from Cardiology for episodes of sweating/dizziness and avoiding allowing himself to become overheated - to follow up with Cardiology for related concerns  Remind [name] to check his blood sugar as he has been, but also for any symptoms of low blood sugar. Reports that he did check his blood sugar once when he had his dizzy symptoms and had a reading of 136 mg/dL.  Advise patient again to call his Cardiologist for any further episodes  of dizziness  Patient Self Care Activities:  . Self administers medications . Attends all scheduled provider appointments . Calls pharmacy for medication refills . Calls provider office for new concerns or questions  . Patient to check daily blood sugars, including fasting blood sugars, and keep log  Please see past updates related to this goal by clicking on the "Past Updates" button in the selected goal         The patient verbalized understanding of instructions provided today and declined a print copy of patient instruction materials.   The care management team will reach out to the patient again over the next 7 days.   [name], PharmD, [place] [employer] [phone]

## 2018-07-08 NOTE — Chronic Care Management (AMB) (Signed)
Chronic Care Management   Follow Up Note   07/08/2018 Name: [name] MRN: [id number] DOB: 23-Nov-1965  Referred by: [name], DO Reason for referral : Chronic Care Management (Patient Phone Call)   [name] is a 53 y.o. year old male who is a primary care patient of [name], DO. The CCM team was consulted for assistance with chronic disease management and care coordination needs. [name] has a past medical history including but not limited to Type 2 diabetes, hypertension, CKD, chronic systolic heart failure, gout, seasonal allergies and GERD.  Receive message from PCP: patient picked up Patient Assistance Program Trulicity 4.09 mg weekly yesterday that had been shipped to office. Patient needing follow up call as he again stated that he did not want to take both Trulicity and Basaglar.  I reached out to [name] by phone today to follow up regarding is diabetes medication management.  Review of patient status, including review of consultants reports, relevant laboratory and other test results, and collaboration with appropriate care team members and the patient's provider was performed as part of comprehensive patient evaluation and provision of chronic care management services.    Goals Addressed            This Visit's Progress   . Medication Management       Current Barriers:  . Financial Barriers  . Limited blood sugar readings for clinical team  Pharmacist Clinical Goal(s):  [name] Kitchen Over the next 30 days, patient will work with CM Pharmacist to address needs related to medication therapy optimization  Interventions:  Collaborate with PCP. Patient picked up Patient Assistance Program Trulicity 8.11 mg weekly yesterday that had been shipped to office. Patient needing follow up call as he again stated that he did not want to take both Trulicity and Basaglar.  Follow up call to patient regarding his blood sugar monitoring  and diabetes medication management  Counsel patient again to continue to follow the directions from PCP for self-adjustment of his Basaglar based on his fasting blood sugar results as he starts the Trulicity.   [name] verbalizes understanding of this plan, while also expressing hope to only require Trulicity in the future to control his blood sugar  Patient denies need for counseling on administration of Trulicity as he has previously used this medication  Counsel patient again about following reported guidance from Cardiology for episodes of sweating/dizziness and avoiding allowing himself to become overheated - to follow up with Cardiology for related concerns  Remind [name] to check his blood sugar as he has been, but also for any symptoms of low blood sugar. Reports that he did check his blood sugar once when he had his dizzy symptoms and had a reading of 136 mg/dL.  Advise patient again to call his Cardiologist for any further episodes of dizziness  Patient Self Care Activities:  . Self administers medications . Attends all scheduled provider appointments . Calls pharmacy for medication refills . Calls provider office for new concerns or questions  . Patient to check daily blood sugars, including fasting blood sugars, and keep log  Please see past updates related to this goal by clicking on the "Past Updates" button in the selected goal        Plan   1) The care management team will reach out to the patient again over the next 7 days.  2) Patient to call with any health related questions or concerns.   [name],  PharmD, BCACP Clinical Pharmacist [facility] [phone] 

## 2018-07-12 ENCOUNTER — Telehealth: Payer: Self-pay | Admitting: Family Medicine

## 2018-07-12 DIAGNOSIS — M1A39X0 Chronic gout due to renal impairment, multiple sites, without tophus (tophi): Secondary | ICD-10-CM

## 2018-07-12 DIAGNOSIS — M159 Polyosteoarthritis, unspecified: Secondary | ICD-10-CM

## 2018-07-12 NOTE — Telephone Encounter (Signed)
Patient is asking max dose of gabapentin.

## 2018-07-13 ENCOUNTER — Ambulatory Visit: Payer: Self-pay | Admitting: Pharmacist

## 2018-07-13 DIAGNOSIS — IMO0002 Reserved for concepts with insufficient information to code with codable children: Secondary | ICD-10-CM

## 2018-07-13 DIAGNOSIS — E1122 Type 2 diabetes mellitus with diabetic chronic kidney disease: Secondary | ICD-10-CM

## 2018-07-13 MED ORDER — GABAPENTIN 100 MG PO CAPS: 200.0000 mg | ORAL_CAPSULE | Freq: Three times a day (TID) | ORAL | 3 refills | Status: DC

## 2018-07-13 NOTE — Patient Instructions (Signed)
Thank you allowing the Chronic Care Management Team to be a part of your care! It was a pleasure speaking with you today!     CCM (Chronic Care Management) Team    Janci Minor RN, BSN Nurse Care Coordinator  [phone]   [name] PharmD  Clinical Pharmacist  [phone]   [name] LCSW Clinical Social Worker [phone]  Visit Information  Goals Addressed            This Visit's Progress   . Medication Management       Current Barriers:  . Financial Barriers  . Lack of blood sugar results for clinical team  Pharmacist Clinical Goal(s):  [name] Kitchen Over the next 30 days, patient will work with CM Pharmacist to address needs related to medication therapy optimization  Interventions:  Follow up call to patient regarding his blood sugar monitoring and diabetes medication management  Mr. [name] reports that he has not yet started the Trulicity 7.85 mg weekly.   Address patient's questions about timing of administration  [name] states that he is going to start Trulicity tomorrow morning, making Thursday mornings his day to take his Trulicity dose.  Counsel patient again to continue to follow the directions from PCP for self-adjustment of his Basaglar based on his fasting blood sugar results as he starts the Trulicity.   Mr. Sisneros verbalizes understanding  Patient states that he is unable to provide his recent blood sugars now as he does not have his log with him. Counsel patient to continue to check and record his blood sugars.  Counsel patient again about following reported guidance from Cardiology for episodes of sweating/dizziness and avoiding allowing himself to become overheated - to follow up with Cardiology for related concerns  Advise patient again to call his Cardiologist for any further episodes of dizziness  Patient Self Care Activities:  . Self administers medications . Attends all scheduled provider appointments . Calls pharmacy for  medication refills . Calls provider office for new concerns or questions  . Patient to check daily blood sugars, including fasting blood sugars, and keep log  Please see past updates related to this goal by clicking on the "Past Updates" button in the selected goal         The patient verbalized understanding of instructions provided today and declined a print copy of patient instruction materials.   Telephone follow up appointment with care management team member scheduled for: 6/24 at 1 pm  [name], PharmD, [place] [phone]

## 2018-07-13 NOTE — Telephone Encounter (Signed)
Patient prefers max dose of gabapentin 200 mg 3 times a day he will take 2 of 100 mg but will run out soon as per patient.

## 2018-07-13 NOTE — Chronic Care Management (AMB) (Signed)
  Chronic Care Management   Follow Up Note   07/13/2018 Name: [name] MRN: [id number] DOB: [birth year]  Referred by: [name], DO Reason for referral : Chronic Care Management (Patient Phone Call)   [name] is a 53 y.o. year old male who is a primary care patient of [name], DO. The CCM team was consulted for assistance with chronic disease management and care coordination needs.  Mr. Fitzgerald has a past medical history including but not limited to Type 2 diabetes, hypertension, CKD, chronic systolic heart failure, gout, seasonal allergies and GERD.  I reached out to [name] by phone today to follow up regarding is diabetes medication management.  Review of patient status, including review of consultants reports, relevant laboratory and other test results, and collaboration with appropriate care team members and the patient's provider was performed as part of comprehensive patient evaluation and provision of chronic care management services.    Goals Addressed            This Visit's Progress   . Medication Management       Current Barriers:  . Financial Barriers  . Lack of blood sugar results for clinical team  Pharmacist Clinical Goal(s):  Darren Kitchen Over the next 30 days, patient will work with CM Pharmacist to address needs related to medication therapy optimization  Interventions:  Follow up call to patient regarding his blood sugar monitoring and diabetes medication management  [name] reports that he has not yet started the Trulicity 2.77 mg weekly.   Address patient's questions about timing of administration  [name] states that he is going to start Trulicity tomorrow morning, making Thursday mornings his day to take his Trulicity dose.  Counsel patient again to continue to follow the directions from PCP for self-adjustment of his Basaglar based on his fasting blood sugar results as he starts the Trulicity.    [name] verbalizes understanding  Patient states that he is unable to provide his recent blood sugars now as he does not have his log with him. Counsel patient to continue to check and record his blood sugars.  Counsel patient again about following reported guidance from Cardiology for episodes of sweating/dizziness and avoiding allowing himself to become overheated - to follow up with Cardiology for related concerns  Advise patient again to call his Cardiologist for any further episodes of dizziness  Patient Self Care Activities:  . Self administers medications . Attends all scheduled provider appointments . Calls pharmacy for medication refills . Calls provider office for new concerns or questions  . Patient to check daily blood sugars, including fasting blood sugars, and keep log  Please see past updates related to this goal by clicking on the "Past Updates" button in the selected goal         Plan  Telephone follow up appointment with care management team member scheduled for: 6/24 at 1 pm  [name], PharmD, [place] [phone]

## 2018-07-13 NOTE — Telephone Encounter (Signed)
Max dose gabapentin in 24 hours is 600mg .  He can take 200mg  - per dose - 3 times a day.  He has the 100mg  capsules, so 2 pills per dose, 3 times a day.  Let me know if he needs me to re order it.  [name], [facility] 07/13/2018, 8:33 AM

## 2018-07-13 NOTE — Addendum Note (Signed)
Addended by: [name] on: 07/13/2018 10:15 AM   Modules accepted: Orders

## 2018-07-14 ENCOUNTER — Ambulatory Visit: Payer: Self-pay | Admitting: Licensed Clinical Social Worker

## 2018-07-14 ENCOUNTER — Other Ambulatory Visit: Payer: Self-pay

## 2018-07-14 ENCOUNTER — Ambulatory Visit: Payer: Self-pay | Admitting: Pharmacist

## 2018-07-14 DIAGNOSIS — I5022 Chronic systolic (congestive) heart failure: Secondary | ICD-10-CM

## 2018-07-14 DIAGNOSIS — Z794 Long term (current) use of insulin: Secondary | ICD-10-CM | POA: Diagnosis not present

## 2018-07-14 DIAGNOSIS — E1165 Type 2 diabetes mellitus with hyperglycemia: Secondary | ICD-10-CM | POA: Diagnosis not present

## 2018-07-14 DIAGNOSIS — IMO0002 Reserved for concepts with insufficient information to code with codable children: Secondary | ICD-10-CM

## 2018-07-14 DIAGNOSIS — E1122 Type 2 diabetes mellitus with diabetic chronic kidney disease: Secondary | ICD-10-CM

## 2018-07-14 DIAGNOSIS — N184 Chronic kidney disease, stage 4 (severe): Secondary | ICD-10-CM | POA: Diagnosis not present

## 2018-07-14 NOTE — Chronic Care Management (AMB) (Signed)
  Chronic Care Management   Follow Up Note   07/14/2018 Name: [name] MRN: [id number] DOB: 1965/08/06  Referred by: [name], DO Reason for referral : Chronic Care Management (Patient Phone Call)   [name] is a 53 y.o. year old male who is a primary care patient of [name], DO. The CCM team was consulted for assistance with chronic disease management and care coordination needs.  [name] has a past medical history including but not limited to Type 2 diabetes, hypertension, CKD, chronic systolic heart failure, gout, seasonal allergies and GERD.  Receive a message from [name] requesting a call back. I reached out to [name] by phone today.  Review of patient status, including review of consultants reports, relevant laboratory and other test results, and collaboration with appropriate care team members and the patient's provider was performed as part of comprehensive patient evaluation and provision of chronic care management services.    Goals Addressed            This Visit's Progress   . Medication Management       Current Barriers:  . Financial Barriers  . Lack of blood sugar results for clinical team  Pharmacist Clinical Goal(s):  [name] Kitchen Over the next 30 days, patient will work with CM Pharmacist to address needs related to medication therapy optimization  Interventions:  Follow up call to [name], who reports that he has been wondering about his episodes of dizziness.   Discuss again importance of using caution with gabapentin as patient reports that he has wondered if this medication has contributed to his dizziness, but denies having taken gabapentin with every episode  Reports that he has been using gabapentin 100 mg - 2 capsules (200 mg) twice daily  Advise patient again to call his Cardiologist regarding episodes of dizziness  Patient denies checking his own blood pressure at home; denies having a  monitor  Reports that he left a message on the main line of his Cardiologist's office on Monday. However, reports that he has previously been given a direct number to call for his nurse at the office, but denies calling this number  [name] to call to follow up with his Cardiologist office today on direct line provided by practice.  Call to collaborate with [facility] [phone]). Office is currently closed for the day  Patient Self Care Activities:  . Self administers medications . Attends all scheduled provider appointments . Calls pharmacy for medication refills . Calls provider office for new concerns or questions  . Patient to check daily blood sugars, including fasting blood sugars, and keep log  Please see past updates related to this goal by clicking on the "Past Updates" button in the selected goal        Plan   1) [name] to call today to follow up with his Cardiologist 2) Telephone follow up appointment with care management team member scheduled for: 6/24 at 1 pm   [name], PharmD, [place] [phone]

## 2018-07-14 NOTE — Patient Instructions (Addendum)
Thank you allowing the Chronic Care Management Team to be a part of your care! It was a pleasure speaking with you today!     CCM (Chronic Care Management) Team    Janci Minor RN, BSN Nurse Care Coordinator  [phone]   [name] PharmD  Clinical Pharmacist  [phone]   [name] LCSW Clinical Social Worker [phone]  Visit Information  Goals Addressed            This Visit's Progress   . Medication Management       Current Barriers:  . Financial Barriers  . Lack of blood sugar results for clinical team  Pharmacist Clinical Goal(s):  [name] Kitchen Over the next 30 days, patient will work with CM Pharmacist to address needs related to medication therapy optimization  Interventions:  Follow up call to Mr. Altland, who reports that he has been wondering about his episodes of dizziness.   Discuss again importance of using caution with gabapentin as patient reports that he has wondered if this medication has contributed to his dizziness, but denies having taken gabapentin with every episode  Reports that he has been using gabapentin 100 mg - 2 capsules (200 mg) twice daily  Advise patient again to call his Cardiologist regarding episodes of dizziness  Patient denies checking his own blood pressure at home; denies having a monitor  Reports that he left a message on the main line of his Cardiologist's office on Monday. However, reports that he has previously been given a direct number to call for his nurse at the office, but denies calling this number  Mr. Brickey to call to follow up with his Cardiologist office today on direct line provided by practice.  Patient Self Care Activities:  . Self administers medications . Attends all scheduled provider appointments . Calls pharmacy for medication refills . Calls provider office for new concerns or questions  . Patient to check daily blood sugars, including fasting blood sugars, and keep log  Please see past  updates related to this goal by clicking on the "Past Updates" button in the selected goal         The patient verbalized understanding of instructions provided today and declined a print copy of patient instruction materials.   Telephone follow up appointment with care management team member scheduled for: 07/20/18  [name], PharmD, [facility] [phone]

## 2018-07-14 NOTE — Chronic Care Management (AMB) (Signed)
  Chronic Care Management    Clinical Social Work Follow Up Note  07/14/2018 Name: [name] MRN: [id number] DOB: [birth year]  Okey Regal Fitzgerald is a 53 y.o. year old male who is a primary care patient of [name], DO. The CCM team was consulted for assistance with Food Insecurity.   Review of patient status, including review of consultants reports, other relevant assessments, and collaboration with appropriate care team members and the patient's provider was performed as part of comprehensive patient evaluation and provision of chronic care management services.     Goals Addressed    . "I want to know what resources are available to me" (pt-stated)       Current Barriers:  . Financial constraints . Limited social support . Limited access to food . ADL IADL limitations . Social Isolation . Limited access to caregiver . Lacks knowledge of community resource: financial assistance resources and personal care service resources that are available to him   Clinical Social Work Clinical Goal(s):  [name] Kitchen Over the next 90 days, patient will work with LCSW to address needs related to lack of support within the home . Over the next 90 days, client will follow up with financial assistance programs/resources* as directed by SW  Interventions: . Patient interviewed and appropriate assessments performed . Provided patient with information about crisis support and financial support resources within [facility] . Update-Patient reports receiving his first EBT card in the mail. Patient reports being eligible for $190.00 per month in food stamps. Patient reports that this will help him with managing his finances better.  . Discussed plans with patient for ongoing care management follow up and provided patient with direct contact information for care management team . Advised patient to check email for community resources that were sent out on 05/26/2018. Patient confirmed receiving  resources and education was provided again on these various resource options today on 6/18 . Assisted patient/caregiver with obtaining information about health plan benefits  Patient Self Care Activities:  . Attends all scheduled provider appointments . Calls provider office for new concerns or questions  Please see past updates related to this goal by clicking on the "Past Updates" button in the selected goal     Follow Up Plan: Client will contact CCM LCSW in the future if any further social work needs arise.   [name], BSW, MSW, [email or web address] Phone: [phone]

## 2018-07-18 ENCOUNTER — Telehealth: Payer: Self-pay

## 2018-07-20 ENCOUNTER — Ambulatory Visit: Payer: Self-pay | Admitting: Pharmacist

## 2018-07-20 DIAGNOSIS — IMO0002 Reserved for concepts with insufficient information to code with codable children: Secondary | ICD-10-CM

## 2018-07-20 DIAGNOSIS — I5022 Chronic systolic (congestive) heart failure: Secondary | ICD-10-CM | POA: Diagnosis not present

## 2018-07-20 DIAGNOSIS — E1165 Type 2 diabetes mellitus with hyperglycemia: Secondary | ICD-10-CM | POA: Diagnosis not present

## 2018-07-20 DIAGNOSIS — Z794 Long term (current) use of insulin: Secondary | ICD-10-CM | POA: Diagnosis not present

## 2018-07-20 DIAGNOSIS — E1122 Type 2 diabetes mellitus with diabetic chronic kidney disease: Secondary | ICD-10-CM | POA: Diagnosis not present

## 2018-07-20 DIAGNOSIS — N184 Chronic kidney disease, stage 4 (severe): Secondary | ICD-10-CM | POA: Diagnosis not present

## 2018-07-20 NOTE — Chronic Care Management (AMB) (Signed)
  Chronic Care Management   Follow Up Note   07/20/2018 Name: TREYON WYMORE MRN: [id number] DOB: 23-Aug-1965  Referred by: [name], DO Reason for referral : Chronic Care Management (Patient Phone Call) and Care Coordination [facility])   ROHAIL KLEES is a 53 y.o. year old male who is a primary care patient of [name], DO. The CCM team was consulted for assistance with chronic disease management and care coordination needs.  Mr. Isabell has a past medical history including but not limited to Type 2 diabetes, hypertension, CKD, chronic systolic heart failure, gout, seasonal allergies and GERD.  I reached out to [name] by phone today to follow up regarding his diabetes management.  Place a coordination of care call to patient's Cardiologist. Leave a message requesting call back to patient.   Review of patient status, including review of consultants reports, relevant laboratory and other test results, and collaboration with appropriate care team members and the patient's provider was performed as part of comprehensive patient evaluation and provision of chronic care management services.    Goals Addressed            This Visit's Progress   . Medication Management       Current Barriers:  . Financial Barriers   Pharmacist Clinical Goal(s):  [name] Kitchen Over the next 30 days, patient will work with CM Pharmacist to address needs related to medication therapy optimization  Interventions: . Follow up call to patient regarding his blood sugar monitoring and diabetes medication management o Mr. Bazile reports that he started Trulicity 5.78 mg once weekly last Friday. Reports continuing to take Basaglar insulin 10 units each morning o Counsel patient again to continue to follow the directions from PCP for self-adjustment of his Basaglar based on his fasting blood sugar results as he starts the Trulicity.  o Encourage patient to continue to check  and record his blood sugars as he has been and to check his blood sugar for any signs of lows o Denies any episodes of low blood sugars . Counsel patient again about following reported guidance from Cardiology for episodes of sweating/dizziness and avoiding allowing himself to become overheated - to follow up with Cardiology for related concerns o Mr. Crunk reports that he did call again to Cardiology on Friday left a message, but has not received a call back o Place a coordination of care call to [facility] Cardiology [phone]). Leave a message on voicemail for Manuela Schwartz, RN with practice, requesting call back to patient  Patient Self Care Activities:  . Self administers medications . Attends all scheduled provider appointments . Calls pharmacy for medication refills . Calls provider office for new concerns or questions  . Patient to check daily blood sugars, including fasting blood sugars, and keep log Date Fasting Morning Blood Glucose Bedtime  18 - June 187 162  19 - June 176 151  20 - June 166 150  21 - June 193 161  22 - June 183 157  23 - June 173 153  24 - June 148   Average 175 156   Please see past updates related to this goal by clicking on the "Past Updates" button in the selected goal         Plan  Telephone follow up appointment with care management team member scheduled for: 7/1 at 3 pm  [name], PharmD, [place] [phone]

## 2018-07-20 NOTE — Patient Instructions (Signed)
Thank you allowing the Chronic Care Management Team to be a part of your care! It was a pleasure speaking with you today!     CCM (Chronic Care Management) Team    Janci Minor RN, BSN Nurse Care Coordinator  [phone]   [name] PharmD  Clinical Pharmacist  [phone]   [name] LCSW Clinical Social Worker [phone]  Visit Information  Goals Addressed            This Visit's Progress   . Medication Management       Current Barriers:  . Financial Barriers   Pharmacist Clinical Goal(s):  [name] Kitchen Over the next 30 days, patient will work with CM Pharmacist to address needs related to medication therapy optimization  Interventions: . Follow up call to patient regarding his blood sugar monitoring and diabetes medication management o Mr. Clink reports that he started Trulicity 4.50 mg once weekly last Friday. Reports continuing to take Basaglar insulin 10 units each morning o Counsel patient again to continue to follow the directions from PCP for self-adjustment of his Basaglar based on his fasting blood sugar results as he starts the Trulicity.  o Encourage patient to continue to check and record his blood sugars as he has been and to check his blood sugar for any signs of lows o Denies any episodes of low blood sugars . Counsel patient again about following reported guidance from Cardiology for episodes of sweating/dizziness and avoiding allowing himself to become overheated - to follow up with Cardiology for related concerns o Mr. Tallo reports that he did call again to Cardiology on Friday left a message, but has not received a call back  Patient Self Care Activities:  . Self administers medications . Attends all scheduled provider appointments . Calls pharmacy for medication refills . Calls provider office for new concerns or questions  . Patient to check daily blood sugars, including fasting blood sugars, and keep log Date Fasting Morning Blood  Glucose Bedtime  18 - June 187 162  19 - June 176 151  20 - June 166 150  21 - June 193 161  22 - June 183 157  23 - June 173 153  24 - June 148   Average 175 156   Please see past updates related to this goal by clicking on the "Past Updates" button in the selected goal         The patient verbalized understanding of instructions provided today and declined a print copy of patient instruction materials.   Telephone follow up appointment with care management team member scheduled for: 7/1 at 3 pm  [name], PharmD, [place] [phone]

## 2018-07-21 ENCOUNTER — Telehealth: Payer: Self-pay

## 2018-07-25 ENCOUNTER — Telehealth: Payer: Self-pay | Admitting: Family Medicine

## 2018-07-25 ENCOUNTER — Ambulatory Visit: Payer: Self-pay | Admitting: Pharmacist

## 2018-07-25 DIAGNOSIS — E1122 Type 2 diabetes mellitus with diabetic chronic kidney disease: Secondary | ICD-10-CM | POA: Diagnosis not present

## 2018-07-25 DIAGNOSIS — I5022 Chronic systolic (congestive) heart failure: Secondary | ICD-10-CM

## 2018-07-25 DIAGNOSIS — E1165 Type 2 diabetes mellitus with hyperglycemia: Secondary | ICD-10-CM | POA: Diagnosis not present

## 2018-07-25 DIAGNOSIS — N184 Chronic kidney disease, stage 4 (severe): Secondary | ICD-10-CM | POA: Diagnosis not present

## 2018-07-25 DIAGNOSIS — Z794 Long term (current) use of insulin: Secondary | ICD-10-CM | POA: Diagnosis not present

## 2018-07-25 DIAGNOSIS — IMO0002 Reserved for concepts with insufficient information to code with codable children: Secondary | ICD-10-CM

## 2018-07-25 NOTE — Telephone Encounter (Signed)
Gabapentin at current dose 100mg  capsules, x 2 per dose for 200mg  per dose up to 3 times a day max, should not cause these side effects.  It may cause some mild dizziness, but other side effects seem less likely.  He has already been on Gabapentin, but recently increased the dose.  If it is the only thing causing it then he can reduce the dose and see if side effects feel better.  I would also be cautious with lower blood sugar causing these symptoms.  His last phone call with [facility] was on 6/24 - his CBG readings were reviewed and he was not < 150 fasting usually.  He is on both Trulicity 4.49 and Basaglar insulin 10units daily.  If CBG running <150 he can gradually decrease Basaglar down by 2 units every week as tolerated.  I will forward this to Grayland Ormond as well for review and she can review his meds to determine if other med side effect perhaps at next phone call.  [name], [facility] 07/25/2018, 12:01 PM

## 2018-07-25 NOTE — Telephone Encounter (Signed)
Advised patient as per Dr. Raliegh Ip and he can reduced gabapentin if his Sxs gets worst. As per patient he will follow up with cardiologist.

## 2018-07-25 NOTE — Telephone Encounter (Signed)
Pt. have requested that you call him.

## 2018-07-25 NOTE — Telephone Encounter (Signed)
As per patient after he started gabapentin he feels dizzy, gets chills feels like having seizures don;t know if it's gabapentin or the medication that is Rx by cardiologist, please suggest ?

## 2018-07-25 NOTE — Telephone Encounter (Signed)
I have spoken with [name] [facility], she said that she has discussed these exact dizziness symptoms with patient recently as well, it has been present for a while and he has contacted Cardiology about them as well. It seems to be unrelated to his blood sugar.  It can be made worse by Gabapentin it seems but is still happening when he doesn't take Gabapentin.  He should follow-up as scheduled on 08/01/18 with [facility] to discuss more.  She will keep in touch with him in future.  [name], DO [facility] 07/25/2018, 1:23 PM

## 2018-07-26 NOTE — Chronic Care Management (AMB) (Signed)
  Chronic Care Management   Follow Up Note   07/26/2018 Name: [name] MRN: [id number] DOB: 1965-08-28  Referred by: [name], DO Reason for referral : Chronic Care Management (Patient Phone Call)   [name] is a 53 y.o. year old male who is a primary care patient of [name], DO. The CCM team was consulted for assistance with chronic disease management and care coordination needs.  [name] has a past medical history including but not limited to Type 2 diabetes, hypertension, CKD, chronic systolic heart failure, gout, seasonal allergies and GERD.  Receive incoming call from [place] today.   Review of patient status, including review of consultants reports, relevant laboratory and other test results, and collaboration with appropriate care team members and the patient's provider was performed as part of comprehensive patient evaluation and provision of chronic care management services.    Goals Addressed            This Visit's Progress   . Medication Management       Current Barriers:  . Financial Barriers   Pharmacist Clinical Goal(s):  [name] Kitchen Over the next 30 days, patient will work with CM Pharmacist to address needs related to medication therapy optimization  Interventions: . Perform chart review o Per [facility] Cardiology note from 6/25 - Mr. Whiston called practice regarding lightheadedness at times especially when it is hot outside. Patient scheduled for follow up appointment with Cardiologist to be evaluated and to discuss diuretic/BP meds with Dr. Luana Shu on 7/6 . Counsel patient regarding importance of keeping upcoming appointment with Cardiology o [name] reports that he recently went to a pool party/cookout and while there, he "went out in sun to see what would happen" and became dizzy o Counsel patient on importance of following reported guidance from Cardiology for episodes of sweating/dizziness and avoiding allowing  himself to become overheated o Reports he has stopped taking his gabapentin as we have previously discussed that this medication can potentially cause dizziness. Although patient has also previously reported having had these episodes even when not taking gabapentin. . Counsel patient to continue to monitor his blood sugar closely and keep log o [name] denies any recent low blood sugars . Again discuss with patient the value of having an upper arm home BP monitor, particularly to be able to check and keep log of pressures to report to providers and also to check at times when feeling lightheaded.  o After discussion, [name] reports that he believes that he can now obtain a BP monitor  Patient Self Care Activities:  . Self administers medications . Attends all scheduled provider appointments . Calls pharmacy for medication refills . Calls provider office for new concerns or questions  . Patient to check daily blood sugars, including fasting blood sugars, and keep log  Please see past updates related to this goal by clicking on the "Past Updates" button in the selected goal         Plan  CM Pharmacist to follow up with patient via phone regarding diabetes management as previously scheduled on 7/1 at 3 pm  [name], PharmD, [place] [phone]

## 2018-07-26 NOTE — Patient Instructions (Signed)
Thank you allowing the Chronic Care Management Team to be a part of your care! It was a pleasure speaking with you today!     CCM (Chronic Care Management) Team    Janci Minor RN, BSN Nurse Care Coordinator  [phone]   [name] PharmD  Clinical Pharmacist  [phone]   [name] LCSW Clinical Social Worker [phone]  Visit Information  Goals Addressed            This Visit's Progress   . Medication Management       Current Barriers:  . Financial Barriers   Pharmacist Clinical Goal(s):  [name] Kitchen Over the next 30 days, patient will work with CM Pharmacist to address needs related to medication therapy optimization  Interventions: . Perform chart review o Per [facility] Cardiology note from 6/25 - Mr. Orrick called practice regarding lightheadedness at times especially when it is hot outside. Patient scheduled for follow up appointment with Cardiologist to be evaluated and to discuss diuretic/BP meds with Dr. Luana Shu on 7/6 . Counsel patient regarding importance of keeping upcoming appointment with Cardiology o Mr. Stumph reports that he recently went to a pool party/cookout and while there, he went out in sun to see what would happen and became dizzy o Counsel patient on importance of following reported guidance from Cardiology for episodes of sweating/dizziness and avoiding allowing himself to become overheated o Reports he has stopped taking his gabapentin as we have previously discussed that this medication can potentially cause dizziness. Although patient has also previously reported having had these episodes even when not taking gabapentin. . Counsel patient to continue to monitor his blood sugar closely and keep log o Mr. Hewins denies any recent low blood sugars . Again discuss with patient the value of having an upper arm home BP monitor, particularly to be able to check and keep log of pressures to report to providers and also to check at times when feeling  lightheaded.  o Mr. Tinnel reports that he believes that he can now obtain a BP monitor  Patient Self Care Activities:  . Self administers medications . Attends all scheduled provider appointments . Calls pharmacy for medication refills . Calls provider office for new concerns or questions  . Patient to check daily blood sugars, including fasting blood sugars, and keep log  Please see past updates related to this goal by clicking on the "Past Updates" button in the selected goal         The patient verbalized understanding of instructions provided today and declined a print copy of patient instruction materials.   Telephone follow up appointment with care management team member scheduled for: 7/1 at 3 pm  [name], PharmD, [place] [phone]

## 2018-07-27 ENCOUNTER — Ambulatory Visit: Payer: Self-pay | Admitting: Pharmacist

## 2018-07-27 DIAGNOSIS — IMO0002 Reserved for concepts with insufficient information to code with codable children: Secondary | ICD-10-CM

## 2018-07-27 DIAGNOSIS — E1122 Type 2 diabetes mellitus with diabetic chronic kidney disease: Secondary | ICD-10-CM

## 2018-07-28 ENCOUNTER — Telehealth: Payer: Self-pay

## 2018-07-28 NOTE — Chronic Care Management (AMB) (Signed)
  Chronic Care Management   Follow Up Note   07/28/2018 Name: [name] MRN: [id number] DOB: Nov 21, 1965  Referred by: [name], DO Reason for referral : Chronic Care Management (Patient Phone Call)   [name] is a 53 y.o. year old male who is a primary care patient of [name], DO. The CCM team was consulted for assistance with chronic disease management and care coordination needs.  [name] has a past medical history including but not limited to Type 2 diabetes, hypertension, CKD, chronic systolic heart failure, gout, seasonal allergies and GERD.  I reached out to [name] by phone today.   Review of patient status, including review of consultants reports, relevant laboratory and other test results, and collaboration with appropriate care team members and the patient's provider was performed as part of comprehensive patient evaluation and provision of chronic care management services.    Goals Addressed            This Visit's Progress   . Medication Management       Current Barriers:  . Financial Barriers   Pharmacist Clinical Goal(s):  [name] Kitchen Over the next 30 days, patient will work with CM Pharmacist to address needs related to medication therapy optimization  Interventions: . Follow up with [name] regarding his episodes of afternoon dizziness o Reports that he still has some episodes of dizziness such as when he stands up too fast. However, reports that his dizziness has significantly improved since stopping gabapentin.  - Patient interested in trying an alternative treatment for his neuropathy . Counsel patient regarding importance of keeping upcoming appointment with Cardiology . Again discuss with patient the value of having an upper arm home BP monitor. [name] Kitchen Counsel patient on importance of medication adherence and diabetes management o Review recent blood sugars with patient (see below o Reports continuing to take  Trulicity 6.62 mg weekly on Fridays and Basaglar 10 units once daily. o Denies any recent low blood sugars  Patient Self Care Activities:  . Self administers medications . Attends all scheduled provider appointments . Calls pharmacy for medication refills . Calls provider office for new concerns or questions  . Patient to check daily blood sugars, including fasting blood sugars, and keep log Date Fasting Morning Blood Glucose Bedtime Notes  25 - June 152 135   26 - June 133 168   27 - June 131 149   28 - June  128 180* *Drank sugary drink prior  29 - June 128 143   30 - June 144 146   1 - July -    Average 136 153     Please see past updates related to this goal by clicking on the "Past Updates" button in the selected goal         Plan  1) Will follow up with PCP regarding alternative therapy options for patient's neuropathy as patient reports dizziness with gabapentin 2) Will follow up with patient by telephone on 6/16 at 2:30 pm  [name], PharmD, [place] [phone]

## 2018-07-28 NOTE — Patient Instructions (Signed)
Thank you allowing the Chronic Care Management Team to be a part of your care! It was a pleasure speaking with you today!     CCM (Chronic Care Management) Team    Janci Minor RN, BSN Nurse Care Coordinator  [phone]   [name] PharmD  Clinical Pharmacist  [phone]   [name] LCSW Clinical Social Worker [phone]  Visit Information  Goals Addressed            This Visit's Progress   . Medication Management       Current Barriers:  . Financial Barriers   Pharmacist Clinical Goal(s):  [name] Kitchen Over the next 30 days, patient will work with CM Pharmacist to address needs related to medication therapy optimization  Interventions: . Follow up with Mr. Larrabee regarding his episodes of afternoon dizziness o Reports that he still has some episodes of dizziness such as when he stands up too fast. However, reports that his dizziness has significantly improved since stopping gabapentin.  - Patient interested in trying an alternative treatment for his neuropathy . Counsel patient regarding importance of keeping upcoming appointment with Cardiology . Again discuss with patient the value of having an upper arm home BP monitor. [name] Kitchen Counsel patient on importance of medication adherence and diabetes management o Review recent blood sugars with patient (see below o Reports continuing to take Trulicity 3.66 mg weekly on Fridays and Basaglar 10 units once daily. o Denies any recent low blood sugars  Patient Self Care Activities:  . Self administers medications . Attends all scheduled provider appointments . Calls pharmacy for medication refills . Calls provider office for new concerns or questions  . Patient to check daily blood sugars, including fasting blood sugars, and keep log Date Fasting Morning Blood Glucose Bedtime Notes  25 - June 152 135   26 - June 133 168   27 - June 131 149   28 - June  128 180* *Drank sugary drink prior  29 - June 128 143   30 -  June 144 146   1 - July -    Average 136 153     Please see past updates related to this goal by clicking on the "Past Updates" button in the selected goal         The patient verbalized understanding of instructions provided today and declined a print copy of patient instruction materials.   Telephone follow up appointment with care management team member scheduled for: 7/16 at 2:30 pm  [name], PharmD, [place] [phone]

## 2018-07-30 ENCOUNTER — Other Ambulatory Visit: Payer: Self-pay | Admitting: Family Medicine

## 2018-08-01 DIAGNOSIS — E785 Hyperlipidemia, unspecified: Secondary | ICD-10-CM | POA: Diagnosis not present

## 2018-08-01 DIAGNOSIS — Z7982 Long term (current) use of aspirin: Secondary | ICD-10-CM | POA: Diagnosis not present

## 2018-08-01 DIAGNOSIS — Z6841 Body Mass Index (BMI) 40.0 and over, adult: Secondary | ICD-10-CM | POA: Diagnosis not present

## 2018-08-01 DIAGNOSIS — N183 Chronic kidney disease, stage 3 (moderate): Secondary | ICD-10-CM | POA: Diagnosis not present

## 2018-08-01 DIAGNOSIS — N184 Chronic kidney disease, stage 4 (severe): Secondary | ICD-10-CM | POA: Diagnosis not present

## 2018-08-01 DIAGNOSIS — I5023 Acute on chronic systolic (congestive) heart failure: Secondary | ICD-10-CM | POA: Diagnosis not present

## 2018-08-01 DIAGNOSIS — E669 Obesity, unspecified: Secondary | ICD-10-CM | POA: Diagnosis not present

## 2018-08-01 DIAGNOSIS — I10 Essential (primary) hypertension: Secondary | ICD-10-CM | POA: Diagnosis not present

## 2018-08-01 DIAGNOSIS — I5022 Chronic systolic (congestive) heart failure: Secondary | ICD-10-CM | POA: Diagnosis not present

## 2018-08-01 DIAGNOSIS — E1129 Type 2 diabetes mellitus with other diabetic kidney complication: Secondary | ICD-10-CM | POA: Diagnosis not present

## 2018-08-01 DIAGNOSIS — E1140 Type 2 diabetes mellitus with diabetic neuropathy, unspecified: Secondary | ICD-10-CM | POA: Diagnosis not present

## 2018-08-01 DIAGNOSIS — Z794 Long term (current) use of insulin: Secondary | ICD-10-CM | POA: Diagnosis not present

## 2018-08-01 DIAGNOSIS — E1165 Type 2 diabetes mellitus with hyperglycemia: Secondary | ICD-10-CM | POA: Diagnosis not present

## 2018-08-01 DIAGNOSIS — E1122 Type 2 diabetes mellitus with diabetic chronic kidney disease: Secondary | ICD-10-CM | POA: Diagnosis not present

## 2018-08-01 DIAGNOSIS — I130 Hypertensive heart and chronic kidney disease with heart failure and stage 1 through stage 4 chronic kidney disease, or unspecified chronic kidney disease: Secondary | ICD-10-CM | POA: Diagnosis not present

## 2018-08-04 ENCOUNTER — Ambulatory Visit: Payer: Self-pay | Admitting: *Deleted

## 2018-08-04 ENCOUNTER — Telehealth: Payer: Self-pay

## 2018-08-04 DIAGNOSIS — I5022 Chronic systolic (congestive) heart failure: Secondary | ICD-10-CM

## 2018-08-04 NOTE — Chronic Care Management (AMB) (Signed)
  Chronic Care Management   Outreach Note  08/04/2018 Name: [name] MRN: [id number] DOB: 11/06/1965  Referred by: [name], DO Reason for referral : No chief complaint on file.   An unsuccessful telephone outreach was attempted today. The patient was referred to the case management team by for assistance with chronic care management and care coordination.   Follow Up Plan: A HIPPA compliant phone message was left for the patient providing contact information and requesting a return call.     Merlene Morse [place] RN, BSN Nurse Case Pharmacist, [facility]  [phone]) Business Mobile

## 2018-08-08 ENCOUNTER — Ambulatory Visit: Payer: Self-pay | Admitting: *Deleted

## 2018-08-08 DIAGNOSIS — IMO0002 Reserved for concepts with insufficient information to code with codable children: Secondary | ICD-10-CM

## 2018-08-08 DIAGNOSIS — I5022 Chronic systolic (congestive) heart failure: Secondary | ICD-10-CM

## 2018-08-08 DIAGNOSIS — J984 Other disorders of lung: Secondary | ICD-10-CM

## 2018-08-08 DIAGNOSIS — E1122 Type 2 diabetes mellitus with diabetic chronic kidney disease: Secondary | ICD-10-CM

## 2018-08-08 NOTE — Patient Instructions (Signed)
Thank you allowing the Chronic Care Management Team to be a part of your care! It was a pleasure speaking with you today!   CCM (Chronic Care Management) Team   Shawnta Zimbelman RN, BSN Nurse Care Coordinator  [phone]  [name] PharmD  Clinical Pharmacist  [phone]  [name] LCSW Clinical Social Worker [phone]  Goals Addressed            This Visit's Progress   . I am trying to stay healthy (pt-stated)       Current Barriers:  . Chronic Disease Management support and education needs related to managing multiple chronic disease processes  Nurse Case Manager Clinical Goal(s):  [name] Kitchen Over the next 90 days, patient will work with [facility]  to address needs related to managing multiple chronic disease processes ie  DM2, COPD, HF.   Interventions:  . Advised patient to think about weight loss options . Collaborated with Grayland Ormond Pharm-D regarding patient's current medication regimen . Advised patient, providing education and rationale, to weigh daily and record, calling Cardiologist for weight gain of 3lbs overnight or 5 pounds in a week.  . Reviewed sodium and fluid restrictions set by cardiology.   Patient Self Care Activities:  . Currently UNABLE TO independently and effectively manage multiple chronic disease pocesses   Initial goal documentation     . COMPLETED: I don't want to catch the Corona Virus (pt-stated)       Current Barriers:  [name] Kitchen Knowledge Deficits related to COVID-19 and impact on patient self health management  Clinical Goal(s):  [name] Kitchen Over the next 30 days, patient will verbalize basic understanding of COVID-19 impact on individual health and self health management as evidenced by verbalization of basic understanding of COVID-19 as a viral disease, measures to prevent exposure, signs and symptoms, when to contact provider  Interventions: . Advised patient to wear mask when in public, wash hands frequently and do not touch his  face . Provided education to patient re: Covid-19 . Discussed plans with patient for ongoing care management follow up and provided patient with direct contact information for care management team . Reviewed with patient current recommendations for staying safe.    Patient Self Care Activities:  . Calls provider office for new concerns or questions  Please see past updates related to this goal by clicking on the "Past Updates" button in the selected goal                       The patient verbalized understanding of instructions provided today and declined a print copy of patient instruction materials.   The patient has been provided with contact information for the care management team and has been advised to call with any health related questions or concerns.

## 2018-08-08 NOTE — Chronic Care Management (AMB) (Signed)
  Chronic Care Management   Follow Up Note   08/08/2018 Name: [name] MRN: [id number] DOB: 08/26/1965  Referred by: [name], DO Reason for referral : No chief complaint on file.   [name] is a 53 y.o. year old male who is a primary care patient of [name], DO. The CCM team was consulted for assistance with chronic disease management and care coordination needs.    Review of patient status, including review of consultants reports, relevant laboratory and other test results, and collaboration with appropriate care team members and the patient's provider was performed as part of comprehensive patient evaluation and provision of chronic care management services.    Goals Addressed            This Visit's Progress   . I am trying to stay healthy (pt-stated)      Current Barriers:  . Chronic Disease Management support and education needs related to managing multiple chronic disease processes  Nurse Case Manager Clinical Goal(s):  [name] Kitchen Over the next 90 days, patient will work with [facility]  to address needs related to managing multiple chronic disease processes ie  DM2, COPD, HF.   Interventions:  . Advised patient to think about weight loss options . Collaborated with Grayland Ormond Pharm-D regarding patient's current medication regimen . Advised patient, providing education and rationale, to weigh daily and record, calling Cardiologist for weight gain of 3lbs overnight or 5 pounds in a week.  . Reviewed sodium and fluid restrictions set by cardiology.   Patient Self Care Activities:  . Currently UNABLE TO independently and effectively manage multiple chronic disease pocesses   Initial goal documentation     . COMPLETED: I don't want to catch the Corona Virus (pt-stated)      Current Barriers:  [name] Kitchen Knowledge Deficits related to COVID-19 and impact on patient self health management  Clinical Goal(s):  [name] Kitchen Over the next 30 days, patient will  verbalize basic understanding of COVID-19 impact on individual health and self health management as evidenced by verbalization of basic understanding of COVID-19 as a viral disease, measures to prevent exposure, signs and symptoms, when to contact provider  Interventions: . Advised patient to wear mask when in public, wash hands frequently and do not touch his face . Provided education to patient re: Covid-19 . Discussed plans with patient for ongoing care management follow up and provided patient with direct contact information for care management team . Reviewed with patient current recommendations for staying safe.    Patient Self Care Activities:  . Calls provider office for new concerns or questions  Please see past updates related to this goal by clicking on the "Past Updates" button in the selected goal                      The care management team will reach out to the patient again over the next 30 days.  The patient has been provided with contact information for the care management team and has been advised to call with any health related questions or concerns.   Merlene Morse [place] RN, BSN Nurse Case Pharmacist, [facility]  [phone]) Business Mobile

## 2018-08-09 ENCOUNTER — Ambulatory Visit: Payer: Self-pay | Admitting: Pharmacist

## 2018-08-09 DIAGNOSIS — Z794 Long term (current) use of insulin: Secondary | ICD-10-CM

## 2018-08-09 DIAGNOSIS — I5022 Chronic systolic (congestive) heart failure: Secondary | ICD-10-CM

## 2018-08-09 DIAGNOSIS — E1122 Type 2 diabetes mellitus with diabetic chronic kidney disease: Secondary | ICD-10-CM

## 2018-08-09 NOTE — Patient Instructions (Addendum)
Thank you allowing the Chronic Care Management Team to be a part of your care! It was a pleasure speaking with you today!     CCM (Chronic Care Management) Team    Janci Minor RN, BSN Nurse Care Coordinator  [phone]   [name] PharmD  Clinical Pharmacist  [phone]   [name] LCSW Clinical Social Worker [phone]  Visit Information  Goals Addressed            This Visit's Progress   . Medication Management       Current Barriers:  . Financial Barriers   Pharmacist Clinical Goal(s):  [name] Kitchen Over the next 30 days, patient will work with CM Pharmacist to address needs related to medication therapy optimization  Interventions: . Coordination of care call with CM Nurse Case Manager . Perform chart review  o Per 7/6 with [facility] Cardiology visit note: - Patient's visit BP: 86/70 - Due to dizziness and low BP, patient instructed to reduce Bumex (bumetanide) to 2mg  qAM, 1mg  qPM - Patient instructed to continue fluid restriction (2 Liters per day), sodium restriction (2000-3000 mg per day) and weigh himself daily . Follow up with Mr. Cregger regarding instructions from his Cardiologist.  o Reports taking bumetanide as directed by Cardiologist o Reports improvement in symptoms; denies any dizziness with this adjustment o Denies being able to obtain a blood pressure monitor . Mr. Tugwell reports that he continues to avoid taking gabapentin as he feels that this was also contributing to his dizziness o Advise patient that I messaged his PCP regarding this concern and the possibility of alternative therapy and that per PCP, can discuss with PCP at next visit or call office to schedule a virtual visit to discuss sooner. [name] with [facility] regarding providing patient a blood pressure monitor o Will mail patient [facility] [place] blood pressure monitor and blood pressure log . Advise patient about process for calling [facility] for refills of his  Trulicity and [employer], [employer]. o Patient to review current supply of medications and call dispensing pharmacy today if supply is low o Send patient phone number for [facility] as requested. [name] on importance of continued blood sugar control and monitoring o Patient denies having logged his blood sugar numbers recently. Reports that his fasting numbers have been running in 130s.  o Counsel patient to restart recording blood sugars o Reports continuing to take Trulicity 8.29 mg weekly on Fridays and Basaglar 10 units once daily.  Patient Self Care Activities:  . Self administers medications . Attends all scheduled provider appointments . Calls pharmacy for medication refills o Patient to call [facility] as needed for refills of Trulicity and Engineer, agricultural . Calls provider office for new concerns or questions  . Patient to check daily blood sugars, including fasting blood sugars, and keep log   Please see past updates related to this goal by clicking on the "Past Updates" button in the selected goal         The patient verbalized understanding of instructions provided today and declined a print copy of patient instruction materials.   Telephone follow up appointment with care management team member scheduled for: 7/28 at 2 pm  [name], PharmD, [place] [phone]

## 2018-08-09 NOTE — Chronic Care Management (AMB) (Signed)
Chronic Care Management   Follow Up Note   08/09/2018 Name: [name] MRN: [id number] DOB: May 16, 1965  Referred by: [name], DO Reason for referral : Chronic Care Management (Patient Phone Call)   [name] is a 53 y.o. year old male who is a primary care patient of [name], DO. The CCM team was consulted for assistance with chronic disease management and care coordination needs.  [name] has a past medical history including but not limited to Type 2 diabetes, hypertension, CKD, chronic systolic heart failure, gout, seasonal allergies and GERD.  I reached out to [name] by phone today.   Review of patient status, including review of consultants reports, relevant laboratory and other test results, and collaboration with appropriate care team members and the patient's provider was performed as part of comprehensive patient evaluation and provision of chronic care management services.    Goals Addressed            This Visit's Progress   . Medication Management       Current Barriers:  . Financial Barriers   Pharmacist Clinical Goal(s):  [name] Kitchen Over the next 30 days, patient will work with CM Pharmacist to address needs related to medication therapy optimization  Interventions: . Coordination of care call with CM Nurse Case Manager . Perform chart review  o Per 7/6 with [facility] visit note: - Patient's visit BP: 86/70 - Due to dizziness and low BP, patient instructed to reduce Bumex (bumetanide) to 2mg  qAM, 1mg  qPM - Patient instructed to continue fluid restriction (2 Liters per day), sodium restriction (2000-3000 mg per day) and weigh himself daily . Follow up with [name] regarding instructions from his Cardiologist.  o Reports taking bumetanide as directed by Cardiologist o Reports improvement in symptoms; denies any dizziness with this adjustment o Denies being able to obtain a blood pressure monitor . Mr.  Fitzgerald reports that he continues to avoid taking gabapentin as he feels that this was also contributing to his dizziness o Advise patient that I messaged his PCP regarding this concern and the possibility of alternative therapy and that per PCP, can discuss with PCP at next visit or call office to schedule a virtual visit to discuss sooner. [name] with [facility] regarding providing patient a blood pressure monitor o Will mail patient [facility] [place] blood pressure monitor and blood pressure log . Advise patient about process for calling [facility] for refills of his Trulicity and Engineer, [employer]. o Patient to review current supply of medications and call dispensing pharmacy today if supply is low o Send patient phone number for [facility] as requested. [name] on importance of continued blood sugar control and monitoring o Patient denies having logged his blood sugar numbers recently. Reports that his fasting numbers have been running in 130s.  o Counsel patient to restart recording blood sugars o Reports continuing to take Trulicity 2.10 mg weekly on Fridays and Basaglar 10 units once daily.  Patient Self Care Activities:  . Self administers medications . Attends all scheduled provider appointments . Calls pharmacy for medication refills o Patient to call [facility] as needed for refills of Trulicity and Engineer, agricultural . Calls provider office for new concerns or questions  . Patient to check daily blood sugars, including fasting blood sugars, and keep log   Please see past updates related to this goal by clicking on the "Past Updates" button in the selected goal  Plan  Telephone follow up appointment with care management team member scheduled for: 7/28 at 2 pm  [name], PharmD, [place] [phone]

## 2018-08-10 DIAGNOSIS — N184 Chronic kidney disease, stage 4 (severe): Secondary | ICD-10-CM | POA: Diagnosis not present

## 2018-08-10 DIAGNOSIS — E875 Hyperkalemia: Secondary | ICD-10-CM | POA: Diagnosis not present

## 2018-08-10 DIAGNOSIS — R809 Proteinuria, unspecified: Secondary | ICD-10-CM | POA: Diagnosis not present

## 2018-08-10 DIAGNOSIS — I129 Hypertensive chronic kidney disease with stage 1 through stage 4 chronic kidney disease, or unspecified chronic kidney disease: Secondary | ICD-10-CM | POA: Diagnosis not present

## 2018-08-10 DIAGNOSIS — E1122 Type 2 diabetes mellitus with diabetic chronic kidney disease: Secondary | ICD-10-CM | POA: Diagnosis not present

## 2018-08-11 ENCOUNTER — Telehealth: Payer: Self-pay

## 2018-08-23 ENCOUNTER — Telehealth: Payer: Self-pay

## 2018-08-24 ENCOUNTER — Ambulatory Visit: Payer: Self-pay | Admitting: Pharmacist

## 2018-08-24 ENCOUNTER — Telehealth: Payer: Self-pay

## 2018-08-24 DIAGNOSIS — E1122 Type 2 diabetes mellitus with diabetic chronic kidney disease: Secondary | ICD-10-CM

## 2018-08-24 NOTE — Chronic Care Management (AMB) (Signed)
  Chronic Care Management   Follow Up Note   08/24/2018 Name: [name] MRN: [id number] DOB: 05/22/1965  Referred by: Darren Hauser, DO Reason for referral : Chronic Care Management (Patient Phone Call)   [name] is a 53 y.o. year old male who is a primary care patient of Darren Hauser, DO. The CCM team was consulted for assistance with chronic disease management and care coordination needs. [name] has a past medical history including but not limited to Type 2 diabetes, hypertension, CKD, chronic systolic heart failure, gout, seasonal allergies and GERD.   I reached out to [name] by phone today. Was unable to reach patient via telephone today and have left HIPAA compliant voicemail asking patient to return my call.     Plan  The care management team will reach out to the patient again over the next 14 days.   [name], PharmD, [place] [employer] [phone]

## 2018-08-25 ENCOUNTER — Ambulatory Visit: Payer: Self-pay | Admitting: Pharmacist

## 2018-08-25 DIAGNOSIS — E1122 Type 2 diabetes mellitus with diabetic chronic kidney disease: Secondary | ICD-10-CM

## 2018-08-25 DIAGNOSIS — N184 Chronic kidney disease, stage 4 (severe): Secondary | ICD-10-CM

## 2018-08-25 NOTE — Chronic Care Management (AMB) (Signed)
Chronic Care Management   Follow Up Note   08/25/2018 Name: [name] MRN: [id number] DOB: 1965/07/04  Referred by: [name], DO Reason for referral : Chronic Care Management (Patient Phone Call) and Care Coordination (Nephrology)   [name] is a 53 y.o. year old male who is a primary care patient of [name], DO. The CCM team was consulted for assistance with chronic disease management and care coordination needs.  [name] has a past medical history including but not limited to Type 2 diabetes, hypertension, CKD, chronic systolic heart failure, gout, seasonal allergies and GERD.  I reached out to [name] by phone today.   Coordination of care call to [name]  Review of patient status, including review of consultants reports, relevant laboratory and other test results, and collaboration with appropriate care team members and the patient's provider was performed as part of comprehensive patient evaluation and provision of chronic care management services.    Goals Addressed            This Visit's Progress    Medication Management       Current Barriers:   Financial Barriers   Lack of blood sugar or blood pressure results for the clinical team  Pharmacist Clinical Goal(s):   Over the next 30 days, patient will work with CM Pharmacist to address needs related to medication therapy optimization  Interventions:  Counsel on importance of continued blood sugar control and monitoring o Patient denies having logged his blood sugar numbers recently. Reports CBGs ranging: 108-122 mg/dL this week - Denies any low blood sugars o Counsel patient to restart recording blood sugars, but patient declines to restart recording the numbers unless he observes them to be high or low o Reports continuing to take Trulicity 1.02 mg weekly on Fridays and Basaglar 10 units once daily. o Confirms that he contacted  [employer] as we discussed and now has received refills of Basaglar and [employer] on importance of blood pressure monitoring o [name] confirms having received the upper blood pressure monitor in the mail. o Reports that he has just obtained batteries for the machine, but not yet checked his blood pressure o Encourage patient to start checking and recording his blood pressure regularly   Denies any episodes of dizziness since his bumetinide dose was decreased by his Cardiologist.  Reports that he saw his Nephrologist, Dr. Juleen China, this week and was instructed to further decrease his bumetinide dose, but patient denies having made this adjustment  Coordination of care call to Dr. Juleen China with [place]. Per Dr. Juleen China, based on discussion with patient and labwork, patient was over-diuresed at the time of the visit and he instructed [name] to: o decrease his bumetanide dose to 1 mg twice daily o weigh himself daily and keep log o To follow up for any day-to-day weight change of 3 lbs  o Patient's next appointment: 9/25  Call back to Mr. Herrig and reiterate Dr. Assunta Gambles instruction regarding bumetinide and fluid monitoring. [name] understanding  Patient Self Care Activities:   Self administers medications  Attends all scheduled provider appointments o Next appointment with Nephrology: 9/25  Calls pharmacy for medication refills  Calls provider office for new concerns or questions   Patient to check daily blood sugars, including fasting blood sugars, and keep log   Please see past updates related to this goal by clicking on the "Past Updates" button in  the selected goal         Plan  The care management team will reach out to the patient again over the next 30 days.   [name], PharmD, [place] [employer] [phone]

## 2018-08-25 NOTE — Patient Instructions (Signed)
Thank you allowing the Chronic Care Management Team to be a part of your care! It was a pleasure speaking with you today!     CCM (Chronic Care Management) Team    Janci Minor RN, BSN Nurse Care Coordinator  [phone]   [name] PharmD  Clinical Pharmacist  [phone]   [name] LCSW Clinical Social Worker [phone]  Visit Information  Goals Addressed            This Visit's Progress   . Medication Management       Current Barriers:  . Financial Barriers  . Lack of blood sugar or blood pressure results for the clinical team  Pharmacist Clinical Goal(s):  [name] Kitchen Over the next 30 days, patient will work with CM Pharmacist to address needs related to medication therapy optimization  Interventions: . Counsel on importance of continued blood sugar control and monitoring o Patient denies having logged his blood sugar numbers recently. Reports CBGs ranging: 108-122 mg/dL this week - Denies any low blood sugars o Counsel patient to restart recording blood sugars, but patient declines to restart recording the numbers unless he observes them to be high or low o Reports continuing to take Trulicity 0.35 mg weekly on Fridays and Basaglar 10 units once daily. o Confirms that he contacted [employer] as discussed for refills . Counsel on importance of blood pressure monitoring o [name] confirms having received the upper blood pressure monitor in the mail. o Reports that he has just obtained batteries for the machine, but not yet checked his blood pressure o Encourage patient to start checking and recording his blood pressure regularly  . Denies any episodes of dizziness since his bumetinide dose was decreased by his Cardiologist. . Reports that he saw his Nephrologist, Dr. Juleen China, this week and was instructed to further decrease his bumetinide dose, but patient denies having made this adjustment . Coordination of care call to Dr. Juleen China with [facility]. Per Dr. Juleen China, based on discussion with patient and labwork, patient was over-diuresed at the time of the visit and he instructed [name] to: o decrease his bumetanide dose to 1 mg twice daily o weigh himself daily and keep log o To follow up for any day-to-day weight change of 3 lbs  o Patient's next appointment: 9/25 . Call back to [name] and reiterate Dr. Assunta Gambles instruction regarding bumetinide and fluid monitoring. [name] understanding  Patient Self Care Activities:  . Self administers medications . Attends all scheduled provider appointments o Next appointment with Nephrology: 9/25 . Calls pharmacy for medication refills . Calls provider office for new concerns or questions  . Patient to check daily blood sugars, including fasting blood sugars, and keep log   Please see past updates related to this goal by clicking on the "Past Updates" button in the selected goal         The patient verbalized understanding of instructions provided today and declined a print copy of patient instruction materials.   The care management team will reach out to the patient again over the next 30 days.   [name], PharmD, [place] [employer] [phone]

## 2018-08-26 ENCOUNTER — Other Ambulatory Visit: Payer: Self-pay | Admitting: Family Medicine

## 2018-08-26 DIAGNOSIS — I10 Essential (primary) hypertension: Secondary | ICD-10-CM

## 2018-09-05 ENCOUNTER — Telehealth: Payer: Self-pay

## 2018-09-08 ENCOUNTER — Telehealth: Payer: Self-pay

## 2018-09-15 ENCOUNTER — Telehealth: Payer: Self-pay

## 2018-09-22 ENCOUNTER — Telehealth: Payer: Self-pay

## 2018-09-30 ENCOUNTER — Telehealth: Payer: Self-pay

## 2018-10-04 DIAGNOSIS — E113292 Type 2 diabetes mellitus with mild nonproliferative diabetic retinopathy without macular edema, left eye: Secondary | ICD-10-CM | POA: Diagnosis not present

## 2018-10-04 LAB — HM DIABETES EYE EXAM

## 2018-10-05 ENCOUNTER — Other Ambulatory Visit: Payer: Self-pay | Admitting: Family Medicine

## 2018-10-05 ENCOUNTER — Other Ambulatory Visit: Payer: PPO

## 2018-10-05 ENCOUNTER — Other Ambulatory Visit: Payer: Self-pay

## 2018-10-05 DIAGNOSIS — Z0000 Encounter for general adult medical examination without abnormal findings: Secondary | ICD-10-CM

## 2018-10-05 DIAGNOSIS — N184 Chronic kidney disease, stage 4 (severe): Secondary | ICD-10-CM

## 2018-10-05 DIAGNOSIS — I129 Hypertensive chronic kidney disease with stage 1 through stage 4 chronic kidney disease, or unspecified chronic kidney disease: Secondary | ICD-10-CM

## 2018-10-05 DIAGNOSIS — M1A39X0 Chronic gout due to renal impairment, multiple sites, without tophus (tophi): Secondary | ICD-10-CM

## 2018-10-05 DIAGNOSIS — E1122 Type 2 diabetes mellitus with diabetic chronic kidney disease: Secondary | ICD-10-CM | POA: Diagnosis not present

## 2018-10-05 DIAGNOSIS — E1169 Type 2 diabetes mellitus with other specified complication: Secondary | ICD-10-CM

## 2018-10-05 DIAGNOSIS — E785 Hyperlipidemia, unspecified: Secondary | ICD-10-CM | POA: Diagnosis not present

## 2018-10-05 DIAGNOSIS — K219 Gastro-esophageal reflux disease without esophagitis: Secondary | ICD-10-CM

## 2018-10-05 DIAGNOSIS — Z125 Encounter for screening for malignant neoplasm of prostate: Secondary | ICD-10-CM

## 2018-10-05 DIAGNOSIS — Z794 Long term (current) use of insulin: Secondary | ICD-10-CM

## 2018-10-06 ENCOUNTER — Ambulatory Visit (INDEPENDENT_AMBULATORY_CARE_PROVIDER_SITE_OTHER): Payer: PPO | Admitting: Pharmacist

## 2018-10-06 DIAGNOSIS — Z794 Long term (current) use of insulin: Secondary | ICD-10-CM

## 2018-10-06 DIAGNOSIS — E1122 Type 2 diabetes mellitus with diabetic chronic kidney disease: Secondary | ICD-10-CM

## 2018-10-06 DIAGNOSIS — I5022 Chronic systolic (congestive) heart failure: Secondary | ICD-10-CM

## 2018-10-06 DIAGNOSIS — N184 Chronic kidney disease, stage 4 (severe): Secondary | ICD-10-CM | POA: Diagnosis not present

## 2018-10-06 LAB — COMPLETE METABOLIC PANEL WITH GFR
AG Ratio: 1.1 (calc) (ref 1.0–2.5)
ALT: 9 U/L (ref 9–46)
AST: 13 U/L (ref 10–35)
Albumin: 3.3 g/dL — ABNORMAL LOW (ref 3.6–5.1)
Alkaline phosphatase (APISO): 89 U/L (ref 35–144)
BUN/Creatinine Ratio: 14 (calc) (ref 6–22)
BUN: 26 mg/dL — ABNORMAL HIGH (ref 7–25)
CO2: 32 mmol/L (ref 20–32)
Calcium: 8.6 mg/dL (ref 8.6–10.3)
Chloride: 99 mmol/L (ref 98–110)
Creat: 1.83 mg/dL — ABNORMAL HIGH (ref 0.70–1.33)
GFR, Est African American: 48 mL/min/{1.73_m2} — ABNORMAL LOW (ref >=60)
GFR, Est Non African American: 42 mL/min/{1.73_m2} — ABNORMAL LOW (ref >=60)
Globulin: 3 g/dL (calc) (ref 1.9–3.7)
Glucose, Bld: 93 mg/dL (ref 65–99)
Potassium: 4.2 mmol/L (ref 3.5–5.3)
Sodium: 137 mmol/L (ref 135–146)
Total Bilirubin: 0.6 mg/dL (ref 0.2–1.2)
Total Protein: 6.3 g/dL (ref 6.1–8.1)

## 2018-10-06 LAB — CBC WITH DIFFERENTIAL/PLATELET
Absolute Monocytes: 390 cells/uL (ref 200–950)
Basophils Absolute: 43 cells/uL (ref 0–200)
Basophils Relative: 0.7 %
Eosinophils Absolute: 140 cells/uL (ref 15–500)
Eosinophils Relative: 2.3 %
HCT: 42.9 % (ref 38.5–50.0)
Hemoglobin: 14.1 g/dL (ref 13.2–17.1)
Lymphs Abs: 1580 cells/uL (ref 850–3900)
MCH: 32.3 pg (ref 27.0–33.0)
MCHC: 32.9 g/dL (ref 32.0–36.0)
MCV: 98.4 fL (ref 80.0–100.0)
MPV: 9.5 fL (ref 7.5–12.5)
Monocytes Relative: 6.4 %
Neutro Abs: 3947 cells/uL (ref 1500–7800)
Neutrophils Relative %: 64.7 %
Platelets: 146 10*3/uL (ref 140–400)
RBC: 4.36 10*6/uL (ref 4.20–5.80)
RDW: 15.5 % — ABNORMAL HIGH (ref 11.0–15.0)
Total Lymphocyte: 25.9 %
WBC: 6.1 10*3/uL (ref 3.8–10.8)

## 2018-10-06 LAB — HEMOGLOBIN A1C
Hgb A1c MFr Bld: 6.1 % of total Hgb — ABNORMAL HIGH (ref <5.7)
Mean Plasma Glucose: 128 (calc)
eAG (mmol/L): 7.1 (calc)

## 2018-10-06 LAB — URIC ACID: Uric Acid, Serum: 4.5 mg/dL (ref 4.0–8.0)

## 2018-10-06 LAB — PSA: PSA: 0.1 ng/mL (ref <=4.0)

## 2018-10-06 LAB — LIPID PANEL
Cholesterol: 89 mg/dL (ref <200)
HDL: 50 mg/dL (ref >=40)
LDL Cholesterol (Calc): 16 mg/dL (calc)
Non-HDL Cholesterol (Calc): 39 mg/dL (calc) (ref <130)
Total CHOL/HDL Ratio: 1.8 (calc) (ref <5.0)
Triglycerides: 150 mg/dL — ABNORMAL HIGH (ref <150)

## 2018-10-06 NOTE — Chronic Care Management (AMB) (Signed)
Chronic Care Management   Follow Up Note   10/06/2018 Name: [name] MRN: [id number] DOB: 21-Apr-1965  Referred by: Darren Hauser, DO Reason for referral : Chronic Care Management (Patient Phone Call)   [name] is a 53 y.o. year old male who is a primary care patient of Darren Hauser, DO. The CCM team was consulted for assistance with chronic disease management and care coordination needs.  [name] has a past medical history including but not limited to Type 2 diabetes, hypertension, CKD, chronic systolic heart failure, gout, seasonal allergies and GERD.  I reached out to [name] by phone today.   Review of patient status, including review of consultants reports, relevant laboratory and other test results, and collaboration with appropriate care team members and the patient's provider was performed as part of comprehensive patient evaluation and provision of chronic care management services.     Outpatient Encounter Medications as of 10/06/2018  Medication Sig Note  . bumetanide (BUMEX) 1 MG tablet Take 1 mg by mouth 2 (two) times daily. Per Dr. Juleen China 10/06/2018: Taking 2 mg (2 tablets) each morning and 1 mg (1 tablet) each afternoon (as last prescribed by Cardiology)  . Dulaglutide (TRULICITY) 8.75 IE/3.3IR SOPN Inject 0.75 mg into the skin once a week. 07/20/2018: Taking on Fridays  . Insulin Glargine (BASAGLAR KWIKPEN) 100 UNIT/ML SOPN Inject 15 Units into the skin daily. Submitted to [employer] for assistance 10/06/2018: Reports taking 14 units each morning  . albuterol (PROVENTIL) (2.5 MG/3ML) 0.083% nebulizer solution Take 3 mLs (2.5 mg total) by nebulization every 6 (six) hours as needed for wheezing or shortness of breath.   [name] Kitchen albuterol (VENTOLIN HFA) 108 (90 Base) MCG/ACT inhaler Inhale 2 puffs into the lungs every 4 (four) hours as needed for wheezing or shortness of breath.   . allopurinol (ZYLOPRIM) 100 MG tablet Take 2 tablets  (200 mg total) by mouth daily.   [name] Kitchen aspirin 81 MG chewable tablet Chew 81 mg by mouth daily.   [name] Kitchen atorvastatin (LIPITOR) 20 MG tablet Take 1 tablet (20 mg total) by mouth daily.   . blood glucose meter kit and supplies KIT Brand of choice; LON 99 months; check FSBS 3x a day; E11.65; disp one meter with 100 strips + 1 refill of strips   . Blood Glucose Monitoring Suppl (ONE TOUCH ULTRA 2) w/Device KIT Use to check blood glucose up to 3 x daily   . carvedilol (COREG) 25 MG tablet TAKE 2 TABLETS TWICE A DAY DAILY.   [name] Kitchen esomeprazole (NEXIUM) 40 MG capsule Take 1 capsule (40 mg total) by mouth daily before breakfast.   . Insulin Pen Needle (B-D UF III MINI PEN NEEDLES) 31G X 5 MM MISC 1 each by Does not apply route daily. Use once daily with Victoza.   . Lancets (ONETOUCH ULTRASOFT) lancets Check blood glucose up to 3 x daily   . lisinopril (ZESTRIL) 2.5 MG tablet Take 1 tablet (2.5 mg total) by mouth daily.   [name] Kitchen loratadine (CLARITIN) 10 MG tablet Take 1 tablet (10 mg total) by mouth daily.   . magnesium oxide (MAG-OX) 400 MG tablet Take 1 tablet by mouth daily.   . metolazone (ZAROXOLYN) 5 MG tablet Take 5 mg by mouth daily. as needed (for weight gain, as directed by your provider).   . ONE TOUCH ULTRA TEST test strip Check sugar 3 x daily   . simethicone (MYLICON) 518 MG chewable tablet Chew 125 mg by mouth  every 6 (six) hours as needed for flatulence.   . traMADol (ULTRAM) 50 MG tablet Take 1-2 tablets (50-100 mg total) by mouth every 12 (twelve) hours as needed.   . vitamin C (ASCORBIC ACID) 500 MG tablet Take 1,000 mg by mouth daily.    No facility-administered encounter medications on file as of 10/06/2018.     Goals Addressed            This Visit's Progress   . Medication Management       Current Barriers:  . Financial Barriers  . Lack of blood sugar or blood pressure results for the clinical team  Pharmacist Clinical Goal(s):  [name] Kitchen Over the next 30 days, patient will work with CM  Pharmacist to address needs related to medication therapy optimization  Interventions: . Counsel on importance of continued blood sugar control and monitoring o Reports recent CBGs ranging: 100-130s mg/dL - Denies any low blood sugars o Reports taking: Trulicity 2.06 mg weekly on Fridays and Basaglar 14 units once daily. - Remind patient to follow instructions for Basaglar dose from PCP:  If CBG consistently running <150 he can gradually decrease Basaglar down by 2 units every week as tolerated. o Encourage patient to focus on eating regular and balanced meals - Reports that he has often been eating only lunch, skipping breakfast and supper o Review with patient how to manage low blood sugars . Counsel on importance of blood pressure monitoring o Encourage patient to start checking and recording his blood pressure regularly  . Denies any episodes of dizziness since his bumetinide dose was decreased by his Cardiologist. o Reports that he has continued to take the bumetanide dose as directed by Cardiologist (2 mg QAM; 1 mg Qafternoon) rather than as instructed by Nephrologist (1 mg twice daily)  Patient Self Care Activities:  . Self administers medications . Attends all scheduled provider appointments o Next appointment with PCP: 9/15 . Calls pharmacy for medication refills . Calls provider office for new concerns or questions  . Patient to check daily blood sugars, including fasting blood sugars, and keep log   Please see past updates related to this goal by clicking on the "Past Updates" button in the selected goal         Plan  Telephone follow up appointment with care management team member scheduled for: 10/22 at 2 pm  [name], PharmD, [place] [phone]

## 2018-10-06 NOTE — Patient Instructions (Signed)
Thank you allowing the Chronic Care Management Team to be a part of your care! It was a pleasure speaking with you today!     CCM (Chronic Care Management) Team    Janci Minor RN, BSN Nurse Care Coordinator  [phone]   [name] PharmD  Clinical Pharmacist  [phone]   [name] LCSW Clinical Social Worker [phone]  Visit Information  Goals Addressed            This Visit's Progress   . Medication Management       Current Barriers:  . Financial Barriers  . Lack of blood sugar or blood pressure results for the clinical team  Pharmacist Clinical Goal(s):  [name] Kitchen Over the next 30 days, patient will work with CM Pharmacist to address needs related to medication therapy optimization  Interventions: . Counsel on importance of continued blood sugar control and monitoring o Reports recent CBGs ranging: 100-130s mg/dL - Denies any low blood sugars o Reports taking: Trulicity [id number] mg weekly on Fridays and Basaglar 14 units once daily. - Remind patient to follow instructions for Basaglar dose from PCP:  If CBG consistently running <150 he can gradually decrease Basaglar down by 2 units every week as tolerated. o Encourage patient to focus on eating regular and balanced meals - Reports that he has often been eating only lunch, skipping breakfast and supper o Review with patient how to manage low blood sugars . Counsel on importance of blood pressure monitoring o Encourage patient to start checking and recording his blood pressure regularly  . Denies any episodes of dizziness since his bumetinide dose was decreased by his Cardiologist. o Reports that he has continued to take the bumetanide dose as directed by Cardiologist (2 mg QAM; 1 mg Qafternoon) rather than as instructed by Nephrologist (1 mg twice daily)  Patient Self Care Activities:  . Self administers medications . Attends all scheduled provider appointments o Next appointment with PCP: 9/15 . Calls  pharmacy for medication refills . Calls provider office for new concerns or questions  . Patient to check daily blood sugars, including fasting blood sugars, and keep log   Please see past updates related to this goal by clicking on the "Past Updates" button in the selected goal         The patient verbalized understanding of instructions provided today and declined a print copy of patient instruction materials.   Telephone follow up appointment with care management team member scheduled for: 10/22 at 2 pm  [name], PharmD, [place] [phone]

## 2018-10-10 ENCOUNTER — Telehealth: Payer: Self-pay

## 2018-10-11 ENCOUNTER — Encounter: Payer: PPO | Admitting: Family Medicine

## 2018-10-12 ENCOUNTER — Encounter: Payer: Self-pay | Admitting: Family Medicine

## 2018-10-17 ENCOUNTER — Encounter: Payer: Self-pay | Admitting: Family Medicine

## 2018-10-17 DIAGNOSIS — E113299 Type 2 diabetes mellitus with mild nonproliferative diabetic retinopathy without macular edema, unspecified eye: Secondary | ICD-10-CM | POA: Insufficient documentation

## 2018-10-20 ENCOUNTER — Telehealth: Payer: Self-pay

## 2018-10-22 ENCOUNTER — Other Ambulatory Visit: Payer: Self-pay | Admitting: Family Medicine

## 2018-10-22 DIAGNOSIS — I10 Essential (primary) hypertension: Secondary | ICD-10-CM

## 2018-10-25 ENCOUNTER — Other Ambulatory Visit: Payer: Self-pay | Admitting: Family Medicine

## 2018-10-25 DIAGNOSIS — I10 Essential (primary) hypertension: Secondary | ICD-10-CM

## 2018-10-27 ENCOUNTER — Other Ambulatory Visit: Payer: Self-pay | Admitting: Family Medicine

## 2018-10-27 ENCOUNTER — Telehealth: Payer: Self-pay

## 2018-10-27 NOTE — Telephone Encounter (Signed)
Not our pt

## 2018-10-27 NOTE — Telephone Encounter (Signed)
Requested medication (s) are due for refill today: yes  Requested medication (s) are on the active medication list: yes   Future visit scheduled:no  Notes to clinic:  Refill cannot be delegated   Requested Prescriptions  Pending Prescriptions Disp Refills   Vitamin D, Ergocalciferol, (DRISDOL) 1.25 MG (50000 UT) CAPS capsule [Pharmacy Med Name: VITAMIN D2 1.25MG (50,000 UNIT)] 1 capsule 0    Sig: Take 1 capsule (50,000 Units total) by mouth every 30 (thirty) days.     Endocrinology:  Vitamins - Vitamin D Supplementation Failed - 10/27/2018  3:14 PM      Failed - 50,000 IU strengths are not delegated      Failed - Phosphate in normal range and within 360 days    No results found for: PHOS       Failed - Vitamin D in normal range and within 360 days    Vit D, 25-Hydroxy  Date Value Ref Range Status  08/06/2015 30 30 - 100 ng/mL Final    Comment:    Vitamin D Status           25-OH Vitamin D        Deficiency                <20 ng/mL        Insufficiency         20 - 29 ng/mL        Optimal             > or = 30 ng/mL   For 25-OH Vitamin D testing on patients on D2-supplementation and patients for whom quantitation of D2 and D3 fractions is required, the QuestAssureD 25-OH VIT D, (D2,D3), LC/MS/MS is recommended: order code [id number] (patients > 2 yrs).          Failed - Valid encounter within last 12 months    Recent Outpatient Visits          3 years ago Insomnia   [place], [name], MD   3 years ago Uncontrolled type 2 diabetes mellitus with proteinuria or albuminuria   [place], [name], MD   4 years ago Uncontrolled type 2 diabetes mellitus with proteinuria or albuminuria   [facility] [name], [name], MD             Passed - Ca in normal range and within 360 days    Calcium  Date Value Ref Range Status  10/05/2018 8.6 8.6 - 10.3 mg/dL Final

## 2018-10-28 ENCOUNTER — Other Ambulatory Visit: Payer: Self-pay | Admitting: Family Medicine

## 2018-10-28 DIAGNOSIS — M1A39X0 Chronic gout due to renal impairment, multiple sites, without tophus (tophi): Secondary | ICD-10-CM

## 2018-10-28 NOTE — Telephone Encounter (Signed)
Patient will need labs and to discuss this with his primary provider.  Please discuss at next appointment or schedule an appointment with Dr. Raliegh Ip.

## 2018-10-31 ENCOUNTER — Other Ambulatory Visit: Payer: Self-pay | Admitting: Family Medicine

## 2018-10-31 DIAGNOSIS — M1A39X0 Chronic gout due to renal impairment, multiple sites, without tophus (tophi): Secondary | ICD-10-CM

## 2018-10-31 MED ORDER — ALLOPURINOL 100 MG PO TABS: 200.0000 mg | ORAL_TABLET | Freq: Every day | ORAL | 1 refills | Status: DC

## 2018-11-03 ENCOUNTER — Ambulatory Visit: Payer: Self-pay | Admitting: *Deleted

## 2018-11-03 ENCOUNTER — Telehealth: Payer: Self-pay

## 2018-11-03 DIAGNOSIS — E1122 Type 2 diabetes mellitus with diabetic chronic kidney disease: Secondary | ICD-10-CM

## 2018-11-03 NOTE — Chronic Care Management (AMB) (Signed)
  Chronic Care Management   Outreach Note  11/03/2018 Name: [name] MRN: [id number] DOB: January 08, 1966  Referred by: [name], DO Reason for referral : Chronic Care Management (Unsuccessful outreach )   An unsuccessful telephone outreach was attempted today. The patient was referred to the case management team by for assistance with care management and care coordination.   Follow Up Plan: A HIPPA compliant phone message was left for the patient providing contact information and requesting a return call.  The care management team will reach out to the patient again over the next 30 days.   Merlene Morse Jameis Newsham RN, BSN Nurse Case Pharmacist, [facility]  [phone]) Business Mobile

## 2018-11-15 ENCOUNTER — Ambulatory Visit: Payer: Self-pay | Admitting: Pharmacist

## 2018-11-15 DIAGNOSIS — I10 Essential (primary) hypertension: Secondary | ICD-10-CM

## 2018-11-15 DIAGNOSIS — E1122 Type 2 diabetes mellitus with diabetic chronic kidney disease: Secondary | ICD-10-CM

## 2018-11-15 DIAGNOSIS — N184 Chronic kidney disease, stage 4 (severe): Secondary | ICD-10-CM

## 2018-11-15 NOTE — Chronic Care Management (AMB) (Signed)
Chronic Care Management   Follow Up Note   11/15/2018 Name: [name] MRN: [id number] DOB: Dec 26, 1965  Referred by: [name], DO Reason for referral : Chronic Care Management (Patient Phone Call)   [name] is a 53 y.o. year old male who is a primary care patient of [name], DO. The CCM team was consulted for assistance with chronic disease management and care coordination needs.  [name] has a past medical history including but not limited to Type 2 diabetes, hypertension, CKD, chronic systolic heart failure, gout, seasonal allergies and GERD.  Receive voicemail message from [name] requesting a call back.  I reached out to [name] by phone today.   Review of patient status, including review of consultants reports, relevant laboratory and other test results, and collaboration with appropriate care team members and the patient's provider was performed as part of comprehensive patient evaluation and provision of chronic care management services.    Outpatient Encounter Medications as of 11/15/2018  Medication Sig Note  . Dulaglutide (TRULICITY) 9.47 ML/4.6TK SOPN Inject 0.75 mg into the skin once a week. 07/20/2018: Taking on Fridays  . Insulin Glargine (BASAGLAR KWIKPEN) 100 UNIT/ML SOPN Inject 15 Units into the skin Darren. Submitted to [employer] for assistance 10/06/2018: Reports taking 14 units each morning  . albuterol (PROVENTIL) (2.5 MG/3ML) 0.083% nebulizer solution Take 3 mLs (2.5 mg total) by nebulization every 6 (six) hours as needed for wheezing or shortness of breath.   [name] Kitchen albuterol (VENTOLIN HFA) 108 (90 Base) MCG/ACT inhaler Inhale 2 puffs into the lungs every 4 (four) hours as needed for wheezing or shortness of breath.   . allopurinol (ZYLOPRIM) 100 MG tablet Take 2 tablets (200 mg total) by mouth Darren.   [name] Kitchen aspirin 81 MG chewable tablet Chew 81 mg by mouth Darren.   [name] Kitchen atorvastatin (LIPITOR) 20 MG tablet Take 1  tablet (20 mg total) by mouth Darren.   . blood glucose meter kit and supplies K[employer] Brand of choice; LON 99 months; check FSBS 3x a day; E11.65; disp one meter with 100 strips + 1 refill of strips   . Blood Glucose Monitoring Suppl (ONE TOUCH ULTRA 2) w/Device KIT Use to check blood glucose up to 3 x Darren   . bumetanide (BUMEX) 1 MG tablet Take 1 mg by mouth 2 (two) times Darren. Per Dr. Juleen China 10/06/2018: Taking 2 mg (2 tablets) each morning and 1 mg (1 tablet) each afternoon (as last prescribed by Cardiology)  . carvedilol (COREG) 25 MG tablet TAKE 2 TABLETS TWICE A DAY Darren.   [name] Kitchen esomeprazole (NEXIUM) 40 MG capsule Take 1 capsule (40 mg total) by mouth Darren before breakfast.   . Insulin Pen Needle (B-D UF III MINI PEN NEEDLES) 31G X 5 MM MISC 1 each by Does not apply route Darren. Use once Darren with Victoza.   . Lancets (ONETOUCH ULTRASOFT) lancets Check blood glucose up to 3 x Darren   . lisinopril (ZESTRIL) 2.5 MG tablet Take 1 tablet (2.5 mg total) by mouth Darren.   [name] Kitchen loratadine (CLARITIN) 10 MG tablet Take 1 tablet (10 mg total) by mouth Darren.   . magnesium oxide (MAG-OX) 400 MG tablet Take 1 tablet by mouth Darren.   . metolazone (ZAROXOLYN) 5 MG tablet Take 5 mg by mouth Darren. as needed (for weight gain, as directed by your provider).   . ONE TOUCH ULTRA TEST test strip Check sugar 3 x Darren   . simethicone (  MYLICON) 110 MG chewable tablet Chew 125 mg by mouth every 6 (six) hours as needed for flatulence.   . traMADol (ULTRAM) 50 MG tablet Take 1-2 tablets (50-100 mg total) by mouth every 12 (twelve) hours as needed.   . vitamin C (ASCORBIC ACID) 500 MG tablet Take 1,000 mg by mouth Darren.    No facility-administered encounter medications on file as of 11/15/2018.     Goals Addressed            This Visit's Progress   . PharmD - Medication Management       Current Barriers:  . Financial Barriers  . Lack of blood sugar or blood pressure results for the clinical team  Pharmacist  Clinical Goal(s):  [name] Kitchen Over the next 30 days, patient will work with CM Pharmacist to address needs related to medication therapy optimization  Interventions: . Counsel on importance of continued blood sugar control and monitoring o Reports recent CBGs ranging: 90s-112 mg/dL - Denies any low blood sugars o Reports taking: Trulicity 2.11 mg weekly on Fridays and Basaglar 14 units once Darren. o Counsel on factors that affect blood sugar including stress, exercise and diet o Encourage patient to focus on eating regular and balanced meals . Counsel on importance of blood pressure monitoring o Note patient has home blood pressure monitor o Encourage patient to start checking and recording his blood pressure regularly  . Denies any recent episodes of dizziness  . Encourage patient to call to reschedule canceled PCP visit and to receive flu vaccine . Advise patient to review current supply of both Trulicity and Basaglar to confirm that he has enough medication to last through the end of the calendar year and, if not, to order refills from assistance program.  Patient Self Care Activities:  . Self administers medications . Attends all scheduled provider appointments o Next appointment with Nephrology: 11/2 . Calls pharmacy for medication refills . Calls provider office for new concerns or questions  . Patient to check Darren blood sugars, including fasting blood sugars, and keep log   Please see past updates related to this goal by clicking on the "Past Updates" button in the selected goal         Plan  The care management team will reach out to the patient again over the next 30 days.   [name], PharmD, [place] [employer] [phone]

## 2018-11-15 NOTE — Patient Instructions (Signed)
Thank you allowing the Chronic Care Management Team to be a part of your care! It was a pleasure speaking with you today!     CCM (Chronic Care Management) Team    Janci Minor RN, BSN Nurse Care Coordinator  [phone]   [name] PharmD  Clinical Pharmacist  [phone]   [name] LCSW Clinical Social Worker [phone]  Visit Information  Goals Addressed            This Visit's Progress   . PharmD - Medication Management       Current Barriers:  . Financial Barriers  . Lack of blood sugar or blood pressure results for the clinical team  Pharmacist Clinical Goal(s):  [name] Kitchen Over the next 30 days, patient will work with CM Pharmacist to address needs related to medication therapy optimization  Interventions: . Counsel on importance of continued blood sugar control and monitoring o Reports recent CBGs ranging: 90s-112 mg/dL - Denies any low blood sugars o Reports taking: Trulicity [id number] mg weekly on Fridays and Basaglar 14 units once daily. o Counsel on factors that affect blood sugar including stress, exercise and diet o Encourage patient to focus on eating regular and balanced meals . Counsel on importance of blood pressure monitoring o Note patient has home blood pressure monitor o Encourage patient to start checking and recording his blood pressure regularly  . Denies any recent episodes of dizziness  . Encourage patient to call to reschedule canceled PCP visit and to receive flu vaccine . Advise patient to review current supply of both Trulicity and Basaglar to confirm that he has enough medication to last through the end of the calendar year and, if not, to order refills from assistance program.  Patient Self Care Activities:  . Self administers medications . Attends all scheduled provider appointments o Next appointment with Nephrology: 11/2 o Next appointment with Cardiology: 1/4 . Calls pharmacy for medication refills . Calls provider office  for new concerns or questions  . Patient to check daily blood sugars, including fasting blood sugars, and keep log   Please see past updates related to this goal by clicking on the "Past Updates" button in the selected goal         The patient verbalized understanding of instructions provided today and declined a print copy of patient instruction materials.   The care management team will reach out to the patient again over the next 30 days.   [name], PharmD, [name] [employer] [phone]

## 2018-11-17 ENCOUNTER — Telehealth: Payer: Self-pay

## 2018-11-23 ENCOUNTER — Ambulatory Visit: Payer: Self-pay | Admitting: Pharmacist

## 2018-11-23 DIAGNOSIS — E1122 Type 2 diabetes mellitus with diabetic chronic kidney disease: Secondary | ICD-10-CM

## 2018-11-23 NOTE — Chronic Care Management (AMB) (Signed)
Chronic Care Management   Follow Up Note   11/23/2018 Name: [name] MRN: [id number] DOB: Aug 12, 1965  Referred by: [name], DO Reason for referral : Chronic Care Management (Patient Phone Call)   [name] is a 53 y.o. year old male who is a primary care patient of [name], DO. The CCM team was consulted for assistance with chronic disease management and care coordination needs.  [name] has a past medical history including but not limited to Type 2 diabetes, hypertension, CKD, chronic systolic heart failure, gout, seasonal allergies and GERD.  I reached out to [name] by phone today.   Review of patient status, including review of consultants reports, relevant laboratory and other test results, and collaboration with appropriate care team members and the patient's provider was performed as part of comprehensive patient evaluation and provision of chronic care management services.    Outpatient Encounter Medications as of 11/23/2018  Medication Sig Note  . albuterol (PROVENTIL) (2.5 MG/3ML) 0.083% nebulizer solution Take 3 mLs (2.5 mg total) by nebulization every 6 (six) hours as needed for wheezing or shortness of breath.   [name] Kitchen albuterol (VENTOLIN HFA) 108 (90 Base) MCG/ACT inhaler Inhale 2 puffs into the lungs every 4 (four) hours as needed for wheezing or shortness of breath.   . allopurinol (ZYLOPRIM) 100 MG tablet Take 2 tablets (200 mg total) by mouth daily.   [name] Kitchen aspirin 81 MG chewable tablet Chew 81 mg by mouth daily.   [name] Kitchen atorvastatin (LIPITOR) 20 MG tablet Take 1 tablet (20 mg total) by mouth daily.   . blood glucose meter kit and supplies KIT Brand of choice; LON 99 months; check FSBS 3x a day; E11.65; disp one meter with 100 strips + 1 refill of strips   . Blood Glucose Monitoring Suppl (ONE TOUCH ULTRA 2) w/Device KIT Use to check blood glucose up to 3 x daily   . bumetanide (BUMEX) 1 MG tablet Take 1 mg by mouth 2  (two) times daily. Per Dr. Juleen China 10/06/2018: Taking 2 mg (2 tablets) each morning and 1 mg (1 tablet) each afternoon (as last prescribed by Cardiology)  . carvedilol (COREG) 25 MG tablet TAKE 2 TABLETS TWICE A DAY DAILY.   . Dulaglutide (TRULICITY) 6.72 CN/4.7SJ SOPN Inject 0.75 mg into the skin once a week. 07/20/2018: Taking on Fridays  . esomeprazole (NEXIUM) 40 MG capsule Take 1 capsule (40 mg total) by mouth daily before breakfast.   . Insulin Glargine (BASAGLAR KWIKPEN) 100 UNIT/ML SOPN Inject 15 Units into the skin daily. Submitted to [employer] for assistance 10/06/2018: Reports taking 14 units each morning  . Insulin Pen Needle (B-D UF III MINI PEN NEEDLES) 31G X 5 MM MISC 1 each by Does not apply route daily. Use once daily with Victoza.   . Lancets (ONETOUCH ULTRASOFT) lancets Check blood glucose up to 3 x daily   . lisinopril (ZESTRIL) 2.5 MG tablet Take 1 tablet (2.5 mg total) by mouth daily.   [name] Kitchen loratadine (CLARITIN) 10 MG tablet Take 1 tablet (10 mg total) by mouth daily.   . magnesium oxide (MAG-OX) 400 MG tablet Take 1 tablet by mouth daily.   . metolazone (ZAROXOLYN) 5 MG tablet Take 5 mg by mouth daily. as needed (for weight gain, as directed by your provider).   . ONE TOUCH ULTRA TEST test strip Check sugar 3 x daily   . simethicone (MYLICON) 628 MG chewable tablet Chew 125 mg by mouth every  6 (six) hours as needed for flatulence.   . traMADol (ULTRAM) 50 MG tablet Take 1-2 tablets (50-100 mg total) by mouth every 12 (twelve) hours as needed.   . vitamin C (ASCORBIC ACID) 500 MG tablet Take 1,000 mg by mouth daily.    No facility-administered encounter medications on file as of 11/23/2018.     Goals Addressed            This Visit's Progress   . PharmD - Medication Management       Current Barriers:  . Financial Barriers  . Lack of blood sugar or blood pressure results for the clinical team  Pharmacist Clinical Goal(s):  [name] Kitchen Over the next 30 days, patient will work  with CM Pharmacist to address needs related to medication therapy optimization  Interventions: . Receive message from Practice Manager Dione Booze. [name] has not picked up his patient assistance program supplies of Trulicity and Basaglar despite multiple attempts by office to request patient pick up . Outreach call to [name]. Advise patient of need to pick up patient assistance program medications. Remind patient that medications from patient assistance program can be shipped directly to his home, rather than office, if unable to pick up from office in timely manner. o [name] verbalizes understanding and states that he will come to pick up the medications tomorrow (10/28) morning - Follow up with Practice Manager . Perform chart review: confirm patient has rescheduled PCP visit  Patient Self Care Activities:  . Self administers medications . Attends all scheduled provider appointments o Next appointment with Nephrology: 11/2 o Next appointment with PCP: 11/24 o Next appointment with Cardiology: 1/4 . Calls pharmacy for medication refills . Calls provider office for new concerns or questions  . Patient to check daily blood sugars, including fasting blood sugars, and keep log   Please see past updates related to this goal by clicking on the "Past Updates" button in the selected goal         Plan  Telephone follow up appointment with care management team member scheduled for: 11/16 at 1 pm  [name], PharmD, [place] [phone]

## 2018-11-23 NOTE — Patient Instructions (Addendum)
Thank you allowing the Chronic Care Management Team to be a part of your care! It was a pleasure speaking with you today!     CCM (Chronic Care Management) Team    Janci Minor RN, BSN Nurse Care Coordinator  [phone]   [name] PharmD  Clinical Pharmacist  [phone]   [name] LCSW Clinical Social Worker [phone]  Visit Information  Goals Addressed            This Visit's Progress   . PharmD - Medication Management       Current Barriers:  . Financial Barriers  . Lack of blood sugar or blood pressure results for the clinical team  Pharmacist Clinical Goal(s):  [name] Kitchen Over the next 30 days, patient will work with CM Pharmacist to address needs related to medication therapy optimization  Interventions: . Receive message from Practice Manager Dione Booze- Mr. Puzon has not picked up his patient assistance program supplies of Trulicity and Basaglar despite attempts by office to request patient pick up . Outreach call to Mr. [place]. Advise patient of need to pick up patient assistance program medications.  . Perform chart review: confirm patient has rescheduled PCP visit  Patient Self Care Activities:  . Self administers medications . Attends all scheduled provider appointments o Next appointment with Nephrology: 11/2 o Next appointment with PCP: 11/24 o Next appointment with Cardiology: 1/4 . Calls pharmacy for medication refills . Calls provider office for new concerns or questions  . Patient to check daily blood sugars, including fasting blood sugars, and keep log   Please see past updates related to this goal by clicking on the "Past Updates" button in the selected goal         The patient verbalized understanding of instructions provided today and declined a print copy of patient instruction materials.   Telephone follow up appointment with care management team member scheduled for: 11/16 at 1 pm  [name], PharmD,  [place] [phone]

## 2018-11-28 DIAGNOSIS — N2581 Secondary hyperparathyroidism of renal origin: Secondary | ICD-10-CM | POA: Insufficient documentation

## 2018-11-29 ENCOUNTER — Ambulatory Visit: Payer: PPO | Admitting: Family Medicine

## 2018-11-30 ENCOUNTER — Telehealth: Payer: Self-pay | Admitting: Family Medicine

## 2018-11-30 NOTE — Telephone Encounter (Signed)
I left a message asking the patient to call and schedule AWV-I with Tiffany. VDM (DD) °

## 2018-12-01 ENCOUNTER — Telehealth: Payer: Self-pay

## 2018-12-08 ENCOUNTER — Telehealth: Payer: Self-pay

## 2018-12-08 ENCOUNTER — Other Ambulatory Visit: Payer: Self-pay | Admitting: Family Medicine

## 2018-12-08 DIAGNOSIS — K219 Gastro-esophageal reflux disease without esophagitis: Secondary | ICD-10-CM

## 2018-12-12 ENCOUNTER — Telehealth: Payer: Self-pay

## 2018-12-12 ENCOUNTER — Ambulatory Visit: Payer: Self-pay | Admitting: Pharmacist

## 2018-12-12 DIAGNOSIS — I5022 Chronic systolic (congestive) heart failure: Secondary | ICD-10-CM

## 2018-12-12 NOTE — Chronic Care Management (AMB) (Signed)
Chronic Care Management   Follow Up Note   12/12/2018 Name: [name] MRN: [id number] DOB: 02-05-1965  Referred by: [name], DO Reason for referral : Chronic Care Management (Patient Phone Call) and [place] is a 53 y.o. year old male who is a primary care patient of [name], DO. The CCM team was consulted for assistance with chronic disease management and care coordination needs.    Was unable to reach patient via telephone today and have left HIPAA compliant voicemail asking patient to return my call.  Receive voicemail message from patient and again try to call patient back, but unable to reach him. Leave second HIPAA compliant voicemail.  Review of patient status, including review of consultants reports, relevant laboratory and other test results, and collaboration with appropriate care team members and the patient's provider was performed as part of comprehensive patient evaluation and provision of chronic care management services.     Outpatient Encounter Medications as of 12/12/2018  Medication Sig Note  . albuterol (PROVENTIL) (2.5 MG/3ML) 0.083% nebulizer solution Take 3 mLs (2.5 mg total) by nebulization every 6 (six) hours as needed for wheezing or shortness of breath.   [name] Kitchen albuterol (VENTOLIN HFA) 108 (90 Base) MCG/ACT inhaler Inhale 2 puffs into the lungs every 4 (four) hours as needed for wheezing or shortness of breath.   . allopurinol (ZYLOPRIM) 100 MG tablet Take 2 tablets (200 mg total) by mouth daily.   [name] Kitchen aspirin 81 MG chewable tablet Chew 81 mg by mouth daily.   [name] Kitchen atorvastatin (LIPITOR) 20 MG tablet Take 1 tablet (20 mg total) by mouth daily.   . blood glucose meter kit and supplies KIT Brand of choice; LON 99 months; check FSBS 3x a day; E11.65; disp one meter with 100 strips + 1 refill of strips   . Blood Glucose Monitoring Suppl (ONE TOUCH ULTRA 2) w/Device KIT Use to check blood glucose up to 3 x  daily   . bumetanide (BUMEX) 1 MG tablet Take 1 mg by mouth 2 (two) times daily. Per Dr. Juleen China 10/06/2018: Taking 2 mg (2 tablets) each morning and 1 mg (1 tablet) each afternoon (as last prescribed by Cardiology)  . carvedilol (COREG) 25 MG tablet TAKE 2 TABLETS TWICE A DAY DAILY.   . Dulaglutide (TRULICITY) 6.30 ZS/0.1UX SOPN Inject 0.75 mg into the skin once a week. 07/20/2018: Taking on Fridays  . esomeprazole (NEXIUM) 40 MG capsule Take 1 capsule (40 mg total) by mouth daily before breakfast.   . Insulin Glargine (BASAGLAR KWIKPEN) 100 UNIT/ML SOPN Inject 15 Units into the skin daily. Submitted to [employer] for assistance 10/06/2018: Reports taking 14 units each morning  . Insulin Pen Needle (B-D UF III MINI PEN NEEDLES) 31G X 5 MM MISC 1 each by Does not apply route daily. Use once daily with Victoza.   . Lancets (ONETOUCH ULTRASOFT) lancets Check blood glucose up to 3 x daily   . lisinopril (ZESTRIL) 2.5 MG tablet Take 1 tablet (2.5 mg total) by mouth daily.   [name] Kitchen loratadine (CLARITIN) 10 MG tablet Take 1 tablet (10 mg total) by mouth daily.   . magnesium oxide (MAG-OX) 400 MG tablet Take 1 tablet by mouth daily.   . metolazone (ZAROXOLYN) 5 MG tablet Take 5 mg by mouth daily. as needed (for weight gain, as directed by your provider).   [name] Kitchen omeprazole (PRILOSEC) 40 MG capsule Take 1 capsule (40 mg total) by mouth daily.   [name] Kitchen  ONE TOUCH ULTRA TEST test strip Check sugar 3 x daily   . simethicone (MYLICON) 212 MG chewable tablet Chew 125 mg by mouth every 6 (six) hours as needed for flatulence.   . traMADol (ULTRAM) 50 MG tablet Take 1-2 tablets (50-100 mg total) by mouth every 12 (twelve) hours as needed.   . vitamin C (ASCORBIC ACID) 500 MG tablet Take 1,000 mg by mouth daily.    No facility-administered encounter medications on file as of 12/12/2018.     Goals Addressed            This Visit's Progress   . PharmD - Medication Management       Current Barriers:  . Financial Barriers   . Lack of blood sugar or blood pressure results for the clinical team  Pharmacist Clinical Goal(s):  [name] Kitchen Over the next 30 days, patient will work with CM Pharmacist to address needs related to medication therapy optimization  Interventions: . Outreach to patient as scheduled. Unable to reach . Perform chart review o Note patient called office today with complaint of SOB and fluid retention. Note patient declined virtual visit with PCP today.  o Patient advised to call Cardiology or seek care more urgently. o Note patient scheduled for visit with Nephrology tomorrrow, 11/17 . Receive voicemail message from patient requesting call back. Outreach to patient again, but unable to reach  Patient Self Care Activities:  . Self administers medications . Attends all scheduled provider appointments o Next appointment with Nephrology: 11/17 o Next appointment with PCP: 11/24 o Next appointment with Cardiology: 1/4 . Calls pharmacy for medication refills . Calls provider office for new concerns or questions  . Patient to check daily blood sugars, including fasting blood sugars, and keep log   Please see past updates related to this goal by clicking on the "Past Updates" button in the selected goal        Plan   The care management team will reach out to the patient again over the next 30 days.   [name], PharmD, [place] [employer] [phone]

## 2018-12-12 NOTE — Telephone Encounter (Signed)
See note from [place] patient advised to call cardiology or seek care more urgently, do not agree with his request for offering oxygen only in this situation.  [name], [facility] 12/12/2018, 10:15 AM

## 2018-12-12 NOTE — Telephone Encounter (Addendum)
Patient states he is SOB due to seasonal allergies wanted to come in the office and use oxygen from the office without having appointment since it was provided during his office visit, explain the importance and danger of overusing oxygen and when to use oxygen, advised him about his medication for Albuterol nebulizer solution and inhaler, he states he is retaining fluid as well, based on all his symptoms I offered him virtual visit with Dr. Raliegh Ip but he refused and also told him when to seek urgent care. Advised patient as per Dr. Raliegh Ip to call cardiology as well, patient states," I know if I get fluid in my lungs, I am retaining fluid but not in my lungs, I just wanted to find out if I can use your oxygen." he refused to call cardiology or schedule virtual appointment.

## 2018-12-20 ENCOUNTER — Ambulatory Visit: Payer: PPO | Admitting: Family Medicine

## 2018-12-25 ENCOUNTER — Other Ambulatory Visit: Payer: Self-pay | Admitting: Family Medicine

## 2018-12-25 DIAGNOSIS — I10 Essential (primary) hypertension: Secondary | ICD-10-CM

## 2018-12-26 ENCOUNTER — Telehealth: Payer: PPO

## 2018-12-27 ENCOUNTER — Other Ambulatory Visit: Payer: Self-pay

## 2018-12-27 DIAGNOSIS — G894 Chronic pain syndrome: Secondary | ICD-10-CM

## 2018-12-27 DIAGNOSIS — M159 Polyosteoarthritis, unspecified: Secondary | ICD-10-CM

## 2018-12-27 MED ORDER — TRAMADOL HCL 50 MG PO TABS: 50.0000 mg | ORAL_TABLET | Freq: Two times a day (BID) | ORAL | 2 refills | Status: DC | PRN

## 2019-01-02 ENCOUNTER — Telehealth: Payer: Self-pay

## 2019-01-02 ENCOUNTER — Encounter: Payer: Self-pay | Admitting: Family Medicine

## 2019-01-02 ENCOUNTER — Ambulatory Visit (INDEPENDENT_AMBULATORY_CARE_PROVIDER_SITE_OTHER): Payer: PPO | Admitting: Family Medicine

## 2019-01-02 ENCOUNTER — Ambulatory Visit: Payer: Self-pay | Admitting: Pharmacist

## 2019-01-02 ENCOUNTER — Other Ambulatory Visit: Payer: Self-pay

## 2019-01-02 DIAGNOSIS — E785 Hyperlipidemia, unspecified: Secondary | ICD-10-CM

## 2019-01-02 DIAGNOSIS — E113299 Type 2 diabetes mellitus with mild nonproliferative diabetic retinopathy without macular edema, unspecified eye: Secondary | ICD-10-CM | POA: Diagnosis not present

## 2019-01-02 DIAGNOSIS — N184 Chronic kidney disease, stage 4 (severe): Secondary | ICD-10-CM

## 2019-01-02 DIAGNOSIS — I129 Hypertensive chronic kidney disease with stage 1 through stage 4 chronic kidney disease, or unspecified chronic kidney disease: Secondary | ICD-10-CM | POA: Diagnosis not present

## 2019-01-02 DIAGNOSIS — I5022 Chronic systolic (congestive) heart failure: Secondary | ICD-10-CM

## 2019-01-02 DIAGNOSIS — J302 Other seasonal allergic rhinitis: Secondary | ICD-10-CM | POA: Diagnosis not present

## 2019-01-02 DIAGNOSIS — K219 Gastro-esophageal reflux disease without esophagitis: Secondary | ICD-10-CM | POA: Diagnosis not present

## 2019-01-02 DIAGNOSIS — E1122 Type 2 diabetes mellitus with diabetic chronic kidney disease: Secondary | ICD-10-CM

## 2019-01-02 DIAGNOSIS — Z794 Long term (current) use of insulin: Secondary | ICD-10-CM | POA: Diagnosis not present

## 2019-01-02 DIAGNOSIS — E1169 Type 2 diabetes mellitus with other specified complication: Secondary | ICD-10-CM

## 2019-01-02 DIAGNOSIS — I10 Essential (primary) hypertension: Secondary | ICD-10-CM

## 2019-01-02 MED ORDER — CLARITIN 10 MG PO TABS: 10.0000 mg | ORAL_TABLET | Freq: Every day | ORAL | 3 refills | Status: DC

## 2019-01-02 MED ORDER — NEXIUM 40 MG PO CPDR: 40.0000 mg | DELAYED_RELEASE_CAPSULE | Freq: Every day | ORAL | 3 refills | Status: DC

## 2019-01-02 MED ORDER — CARVEDILOL 25 MG PO TABS: 25.0000 mg | ORAL_TABLET | Freq: Two times a day (BID) | ORAL | 1 refills | Status: DC

## 2019-01-02 NOTE — Progress Notes (Signed)
Virtual Visit via Telephone The purpose of this virtual visit is to provide medical care while limiting exposure to the novel coronavirus (COVID19) for both patient and office staff.  Consent was obtained for phone visit:  Yes.   Answered questions that patient had about telehealth interaction:  Yes.   I discussed the limitations, risks, security and privacy concerns of performing an evaluation and management service by telephone. I also discussed with the patient that there may be a patient responsible charge related to this service. The patient expressed understanding and agreed to proceed.  Patient Location: Home Provider Location: [facility])  ---------------------------------------------------------------------- Chief Complaint  Patient presents with  . Diabetes    S: Reviewed CMA documentation. I have called patient and gathered additional HPI as follows:  CHRONIC DM, Type 2 w/ complication Reports no concerns, some fluctuation of sugar at times if eats higher carbs. He did pick up medicine recently but we received more. Due for A1c,did not come by for blood draw CBGs: Avg 120-160, Low >100, High < 200. Meds: Trulicity 9.48NI weekly inj, Basaglar 20 units - on financial assistance through manufacturer Reports good compliance. Tolerating well w/o side-effects Currently on ACEi Lifestyle: - Diet (improving) - Exercise (limited) Denies hypoglycemia, polyuria, visual changes, numbness or tingling.   CHRONIC HTN w/ CKD-IV / 2ndary Renal hyperparathyroidism Chronic Systolic CHF (severe non ischemic cardiomyopathy) Reports normal BP readings. No new concerns. Followed by [facility] nephrology. Followed by [facility] Cardiology Current Meds - Bumex 41m, Carvedilol 279mBID, Lisinopril 2.61m19maily, Metolazone 61mg6mReports good compliance, took meds today. Tolerating well, w/o complaints. Denies CP, dyspnea, HA, edema, dizziness /  lightheadedness   Denies any high risk travel to areas of current concern for COVID19. Denies any known or suspected exposure to person with or possibly with COVID19.  Denies any fevers, chills, sweats, body ache, cough, shortness of breath, sinus pain or pressure, headache, abdominal pain, diarrhea  Past Medical History:  Diagnosis Date  . Allergy   . Asthma   . Gout   . Hypertension   . Morbid obesity (HCC)[place] Social History   Tobacco Use  . Smoking status: Never Smoker  . Smokeless tobacco: Never Used  Substance Use Topics  . Alcohol use: Yes    Comment: a glass of wine and a couple beers a day  . Drug use: No    Current Outpatient Medications:  .  albuterol (PROVENTIL) (2.5 MG/3ML) 0.083% nebulizer solution, Take 3 mLs (2.5 mg total) by nebulization every 6 (six) hours as needed for wheezing or shortness of breath., Disp: 150 mL, Rfl: 1 .  albuterol (VENTOLIN HFA) 108 (90 Base) MCG/ACT inhaler, Inhale 2 puffs into the lungs every 4 (four) hours as needed for wheezing or shortness of breath., Disp: 18 g, Rfl: 2 .  allopurinol (ZYLOPRIM) 100 MG tablet, Take 2 tablets (200 mg total) by mouth daily., Disp: 180 tablet, Rfl: 1 .  aspirin 81 MG chewable tablet, Chew 81 mg by mouth daily., Disp: , Rfl:  .  atorvastatin (LIPITOR) 20 MG tablet, Take 1 tablet (20 mg total) by mouth daily., Disp: 90 tablet, Rfl: 3 .  blood glucose meter kit and supplies KIT, Brand of choice; LON 99 months; check FSBS 3x a day; E11.65; disp one meter with 100 strips + 1 refill of strips, Disp: 1 each, Rfl: 0 .  Blood Glucose Monitoring Suppl (ONE TOUCH ULTRA 2) w/Device KIT, Use to check blood glucose  up to 3 x daily, Disp: 1 each, Rfl: 0 .  bumetanide (BUMEX) 1 MG tablet, Take 1 mg by mouth 2 (two) times daily. Per Dr. Juleen China, Disp: , Rfl:  .  carvedilol (COREG) 25 MG tablet, Take 1 tablet (25 mg total) by mouth 2 (two) times daily with a meal., Disp: 180 tablet, Rfl: 1 .  Dulaglutide (TRULICITY) 3.81  WE/9.9BZ SOPN, Inject 0.75 mg into the skin once a week., Disp: , Rfl:  .  Insulin Glargine (BASAGLAR KWIKPEN) 100 UNIT/ML SOPN, Inject 15 Units into the skin daily. Submitted to [employer] for assistance, Disp: , Rfl:  .  Insulin Pen Needle (B-D UF III MINI PEN NEEDLES) 31G X 5 MM MISC, 1 each by Does not apply route daily. Use once daily with Victoza., Disp: 100 each, Rfl: 12 .  Lancets (ONETOUCH ULTRASOFT) lancets, Check blood glucose up to 3 x daily, Disp: 300 each, Rfl: 3 .  lisinopril (ZESTRIL) 2.5 MG tablet, Take 1 tablet (2.5 mg total) by mouth daily., Disp: 90 tablet, Rfl: 3 .  magnesium oxide (MAG-OX) 400 MG tablet, Take 1 tablet by mouth daily., Disp: , Rfl:  .  metolazone (ZAROXOLYN) 5 MG tablet, Take 5 mg by mouth daily. as needed (for weight gain, as directed by your provider)., Disp: , Rfl:  .  ONE TOUCH ULTRA TEST test strip, Check sugar 3 x daily, Disp: 300 each, Rfl: 3 .  traMADol (ULTRAM) 50 MG tablet, Take 1-2 tablets (50-100 mg total) by mouth every 12 (twelve) hours as needed., Disp: 30 tablet, Rfl: 2 .  vitamin C (ASCORBIC ACID) 500 MG tablet, Take 1,000 mg by mouth daily., Disp: , Rfl:  .  CLARITIN 10 MG tablet, Take 1 tablet (10 mg total) by mouth daily. One po daily x 5 days, Disp: 90 tablet, Rfl: 3 .  NEXIUM 40 MG capsule, Take 1 capsule (40 mg total) by mouth daily before breakfast., Disp: 90 capsule, Rfl: 3 .  simethicone (MYLICON) 169 MG chewable tablet, Chew 125 mg by mouth every 6 (six) hours as needed for flatulence., Disp: , Rfl:   Depression screen [facility] 2/9 01/02/2019 06/08/2018 03/08/2018  Decreased Interest 0 0 0  Down, Depressed, Hopeless 0 0 0  PHQ - 2 Score 0 0 0    No flowsheet data found.  -------------------------------------------------------------------------- O: No physical exam performed due to remote telephone encounter.  Lab results reviewed.  Recent Results (from the past 2160 hour(s))  Uric acid     Status: None   Collection Time: 10/05/18   7:55 AM  Result Value Ref Range   Uric Acid, Serum 4.5 4.0 - 8.0 mg/dL    Comment: Therapeutic target for gout patients: <6.0 mg/dL .   PSA     Status: None   Collection Time: 10/05/18  7:55 AM  Result Value Ref Range   PSA 0.1 < OR = 4.0 ng/mL    Comment: The total PSA value from this assay system is  standardized against the WHO standard. The test  result will be approximately 20% lower when compared  to the equimolar-standardized total PSA (Beckman  Coulter). Comparison of serial PSA results should be  interpreted with this fact in mind. . This test was performed using the Siemens  chemiluminescent method. Values obtained from  different assay methods cannot be used interchangeably. PSA levels, regardless of value, should not be interpreted as absolute evidence of the presence or absence of disease.   Lipid panel  Status: Abnormal   Collection Time: 10/05/18  7:55 AM  Result Value Ref Range   Cholesterol 89 <200 mg/dL   HDL 50 > OR = 40 mg/dL   Triglycerides 150 (H) <150 mg/dL   LDL Cholesterol (Calc) 16 mg/dL (calc)    Comment: Reference range: <100 . Desirable range <100 mg/dL for primary prevention;   <70 mg/dL for patients with CHD or diabetic patients  with > or = 2 CHD risk factors. [name] Kitchen LDL-C is now calculated using the Martin-Hopkins  calculation, which is a validated novel method providing  better accuracy than the Friedewald equation in the  estimation of LDL-C.  Cresenciano Genre et al. Annamaria Helling. 4098;119(14): 2061-2068  (http://education.Quest[employer].com/faq/FAQ164)    Total CHOL/HDL Ratio 1.8 <5.0 (calc)   Non-HDL Cholesterol (Calc) 39 <130 mg/dL (calc)    Comment: For patients with diabetes plus 1 major ASCVD risk  factor, treating to a non-HDL-C goal of <100 mg/dL  (LDL-C of <70 mg/dL) is considered a therapeutic  option.   COMPLETE METABOLIC PANEL WITH GFR     Status: Abnormal   Collection Time: 10/05/18  7:55 AM  Result Value Ref Range   Glucose, Bld  93 65 - 99 mg/dL    Comment: .            Fasting reference interval .    BUN 26 (H) 7 - 25 mg/dL   Creat 1.83 (H) 0.70 - 1.33 mg/dL    Comment: For patients >65 years of age, the reference limit for Creatinine is approximately 13% higher for people identified as African-American. .    GFR, Est Non African American 42 (L) > OR = 60 mL/min/1.78m   GFR, Est African American 48 (L) > OR = 60 mL/min/1.751m  BUN/Creatinine Ratio 14 6 - 22 (calc)   Sodium 137 135 - 146 mmol/L   Potassium 4.2 3.5 - 5.3 mmol/L   Chloride 99 98 - 110 mmol/L   CO2 32 20 - 32 mmol/L   Calcium 8.6 8.6 - 10.3 mg/dL   Total Protein 6.3 6.1 - 8.1 g/dL   Albumin 3.3 (L) 3.6 - 5.1 g/dL   Globulin 3.0 1.9 - 3.7 g/dL (calc)   AG Ratio 1.1 1.0 - 2.5 (calc)   Total Bilirubin 0.6 0.2 - 1.2 mg/dL   Alkaline phosphatase (APISO) 89 35 - 144 U/L   AST 13 10 - 35 U/L   ALT 9 9 - 46 U/L  CBC with Differential/Platelet     Status: Abnormal   Collection Time: 10/05/18  7:55 AM  Result Value Ref Range   WBC 6.1 3.8 - 10.8 Thousand/uL   RBC 4.36 4.20 - 5.80 Million/uL   Hemoglobin 14.1 13.2 - 17.1 g/dL   HCT 42.9 38.5 - 50.0 %   MCV 98.4 80.0 - 100.0 fL   MCH 32.3 27.0 - 33.0 pg   MCHC 32.9 32.0 - 36.0 g/dL   RDW 15.5 (H) 11.0 - 15.0 %   Platelets 146 140 - 400 Thousand/uL   MPV 9.5 7.5 - 12.5 fL   Neutro Abs 3,947 1,500 - 7,800 cells/uL   Lymphs Abs 1,580 850 - 3,900 cells/uL   Absolute Monocytes 390 200 - 950 cells/uL   Eosinophils Absolute 140 15 - 500 cells/uL   Basophils Absolute 43 0 - 200 cells/uL   Neutrophils Relative % 64.7 %   Total Lymphocyte 25.9 %   Monocytes Relative 6.4 %   Eosinophils Relative 2.3 %   Basophils Relative 0.7 %  Hemoglobin A1c     Status: Abnormal   Collection Time: 10/05/18  7:55 AM  Result Value Ref Range   Hgb A1c MFr Bld 6.1 (H) <5.7 % of total Hgb    Comment: For someone without known diabetes, a hemoglobin  A1c value between 5.7% and 6.4% is consistent with prediabetes  and should be confirmed with a  follow-up test. . For someone with known diabetes, a value <7% indicates that their diabetes is well controlled. A1c targets should be individualized based on duration of diabetes, age, comorbid conditions, and other considerations. . This assay result is consistent with an increased risk of diabetes. . Currently, no consensus exists regarding use of hemoglobin A1c for diagnosis of diabetes for children. .    Mean Plasma Glucose 128 (calc)   eAG (mmol/L) 7.1 (calc)    -------------------------------------------------------------------------- A&P:  Problem List Items Addressed This Visit    Type 2 diabetes mellitus, controlled, with renal complications ([place]) - Primary    Last A1c 6.1 improved in 03/158 Complications - CKD-IV, DM retinopathy Also complicated by hypoglycemia episodes Failed Bydureon (unable to operate pen), Trulicity (adherence)  Plan:  1. Improved on Basaglar 10X daily, Trulicity 3.23FT weekly 2. Encourage improved lifestyle - low carb, low sugar diet, reduce portion size, continue improving regular exercise 3. Check CBG, bring log to next visit for review 4. Continue ASA, ACEi, Statin DM Eye 09/2018 completed  Advised him that he should continue to promptly pick up medicines sent to our office for the financial assistance program for his Diabetes.      Hyperlipidemia associated with type 2 diabetes mellitus (HCC)    Controlled cholesterol on statin improving lifestyle Mild elevated TG  Plan: 1. Continue current meds - Atorvastatin 60m 2. Continue ASA 859mfor primary ASCVD risk reduction 3. Encourage improved lifestyle - low carb/cholesterol, reduce portion size, continue improving regular exercise      GERD (gastroesophageal reflux disease)    Refractory symptoms on omeprazole, has improved on generic nexium, requesting brand name Nexium has covered his GERD symptoms better      Relevant Medications   NEXIUM 40 MG  capsule   CKD (chronic kidney disease), stage IV ([place]   Stable CKD-IV Followed by [facility] Dr KoJuleen Chinaemains on allopurinol lower dosage for gout prophylaxis On low dose ACEi [facility] Continue improved hydration      Chronic systolic heart failure (HCC)    Stable, currently well compensated, chronic systolic CHF secondary to non-ischemic cardiomyopathy [facility] On med management low dose ACEi Lisinopril 2.36m29mCarvedilol, Bumex Has declined ICD in past      Relevant Medications   carvedilol (COREG) 25 MG tablet   Benign hypertension with CKD (chronic kidney disease) stage IV (HCC)    Chronic HTN, controlled Secondary complication CKD-IV Followed by [facility] Dr KolJuleen Chinaardilogy On ACEi low dose BB diuretics      Relevant Medications   carvedilol (COREG) 25 MG tablet   Background diabetic retinopathy ([place]  Last DM Eye exam 09/2018 Background retinopathy not requiring treatment Followed by Optometry       Other Visit Diagnoses    Benign hypertension       Relevant Medications   carvedilol (COREG) 25 MG tablet   Seasonal allergies       Relevant Medications   CLARITIN 10 MG tablet      Meds ordered this encounter  Medications  . DISCONTD: carvedilol (COREG) 25 MG  tablet    Sig: Take 1 tablet (25 mg total) by mouth 2 (two) times daily with a meal.    Dispense:  360 tablet    Refill:  1  . CLARITIN 10 MG tablet    Sig: Take 1 tablet (10 mg total) by mouth daily. One po daily x 5 days    Dispense:  90 tablet    Refill:  3    Change to brand name  . NEXIUM 40 MG capsule    Sig: Take 1 capsule (40 mg total) by mouth daily before breakfast.    Dispense:  90 capsule    Refill:  3  . carvedilol (COREG) 25 MG tablet    Sig: Take 1 tablet (25 mg total) by mouth 2 (two) times daily with a meal.    Dispense:  180 tablet    Refill:  1    90 day supply    Follow-up: - Return in 3 months for DM A1c  Patient verbalizes  understanding with the above medical recommendations including the limitation of remote medical advice.  Specific follow-up and call-back criteria were given for patient to follow-up or seek medical care more urgently if needed.   - Time spent in direct consultation with patient on phone: 11 minutes   [name], [facility] 01/02/2019, 9:55 AM

## 2019-01-02 NOTE — Chronic Care Management (AMB) (Signed)
  Chronic Care Management   Follow Up Note   01/02/2019 Name: [name] MRN: [id number] DOB: 1965-11-08  Referred by: [name], DO Reason for referral : Chronic Care Management (Patient Phone Call)   [name] is a 53 y.o. year old male who is a primary care patient of [name], DO. The CCM team was consulted for assistance with chronic disease management and care coordination needs.    Was unable to reach patient via telephone today and have left HIPAA compliant voicemail asking patient to return my call.  Note patient had virtual visit with PCP today.   Plan   The care management team will reach out to the patient again over the next 14 days.   [name], PharmD, [place] [employer] [phone]

## 2019-01-02 NOTE — Assessment & Plan Note (Signed)
Refractory symptoms on omeprazole, has improved on generic nexium, requesting brand name Nexium has covered his GERD symptoms better

## 2019-01-02 NOTE — Assessment & Plan Note (Signed)
Stable CKD-IV Followed by [facility] Dr Juleen China Remains on allopurinol lower dosage for gout prophylaxis On low dose ACEi On diuretics per [facility] CHF Continue improved hydration

## 2019-01-02 NOTE — Assessment & Plan Note (Addendum)
Last DM Eye exam 09/2018 Background retinopathy not requiring treatment Followed by Optometry

## 2019-01-02 NOTE — Assessment & Plan Note (Addendum)
Chronic HTN, controlled Secondary complication CKD-IV Followed by [facility] Dr Kolluru, Cardilogy On ACEi low dose BB diuretics 

## 2019-01-02 NOTE — Patient Instructions (Addendum)
Refills sent to pharmacy Please pick up medication from our office next chance you can - we will need the space in the refrigerator for other medicines.  Please schedule a Follow-up Appointment to: Return in about 3 months (around 04/02/2019) for DM A1c.  If you have any other questions or concerns, please feel free to call the office or send a message through [facility]. You may also schedule an earlier appointment if necessary.  Additionally, you may be receiving a survey about your experience at our office within a few days to 1 week by e-mail or mail. We value your feedback.  [name], DO [place]

## 2019-01-02 NOTE — Assessment & Plan Note (Signed)
Last A1c 6.1 improved in [id number] Complications - CKD-IV, DM retinopathy Also complicated by hypoglycemia episodes Failed Bydureon (unable to operate pen), Trulicity (adherence)  Plan:  1. Improved on Basaglar [id number] daily, Trulicity 0.75mg  weekly 2. Encourage improved lifestyle - low carb, low sugar diet, reduce portion size, continue improving regular exercise 3. Check CBG, bring log to next visit for review 4. Continue ASA, ACEi, Statin DM Eye 09/2018 completed  Advised him that he should continue to promptly pick up medicines sent to our office for the financial assistance program for his Diabetes.

## 2019-01-02 NOTE — Assessment & Plan Note (Signed)
Stable, currently well compensated, chronic systolic CHF secondary to non-ischemic cardiomyopathy [facility]ed [facility] On med management low dose ACEi Lisinopril 2.5mg, Carvedilol, Bumex Has declined ICD in past 

## 2019-01-02 NOTE — Assessment & Plan Note (Addendum)
Controlled cholesterol on statin improving lifestyle °Mild elevated TG ° °Plan: °1. Continue current meds - Atorvastatin 20mg °2. Continue ASA 81mg for primary ASCVD risk reduction °3. Encourage improved lifestyle - low carb/cholesterol, reduce portion size, continue improving regular exercise °

## 2019-01-09 ENCOUNTER — Other Ambulatory Visit: Payer: Self-pay | Admitting: *Deleted

## 2019-01-09 NOTE — Patient Outreach (Signed)
[facility]) Care Management  01/09/2019  [name] April 27, 1965 [id number]   Case reviewed, no patient outreach needed,  and case closed per Bary Castilla, Assistant Clinical Director at [place]  request.   [name]. Annia Friendly, BSN, [place] [facility] Telephonic CM Phone: [phone] Fax: [phone]

## 2019-01-10 ENCOUNTER — Ambulatory Visit (INDEPENDENT_AMBULATORY_CARE_PROVIDER_SITE_OTHER): Payer: PPO | Admitting: Pharmacist

## 2019-01-10 DIAGNOSIS — E1122 Type 2 diabetes mellitus with diabetic chronic kidney disease: Secondary | ICD-10-CM

## 2019-01-10 DIAGNOSIS — N184 Chronic kidney disease, stage 4 (severe): Secondary | ICD-10-CM

## 2019-01-10 DIAGNOSIS — Z794 Long term (current) use of insulin: Secondary | ICD-10-CM | POA: Diagnosis not present

## 2019-01-10 DIAGNOSIS — K219 Gastro-esophageal reflux disease without esophagitis: Secondary | ICD-10-CM

## 2019-01-10 NOTE — Chronic Care Management (AMB) (Signed)
Chronic Care Management   Follow Up Note   01/10/2019 Name: [name] MRN: [id number] DOB: 13-Aug-1965  Referred by: [name], DO Reason for referral : Chronic Care Management (Patient Phone Call)   KIET GEER is a 53 y.o. year old male who is a primary care patient of [name], DO. The CCM team was consulted for assistance with chronic disease management and care coordination needs.  Mr. Demore has a past medical history including but not limited to Type 2 diabetes, hypertension, CKD, chronic systolic heart failure, gout, seasonal allergies and GERD.  I reached out to [name] by phone today.   Review of patient status, including review of consultants reports, relevant laboratory and other test results, and collaboration with appropriate care team members and the patient's provider was performed as part of comprehensive patient evaluation and provision of chronic care management services.     Outpatient Encounter Medications as of 01/10/2019  Medication Sig Note  . CLARITIN 10 MG tablet Take 1 tablet (10 mg total) by mouth daily. One po daily x 5 days   . Dulaglutide (TRULICITY) 2.37 SE/8.3TD SOPN Inject 0.75 mg into the skin once a week. 07/20/2018: Taking on Fridays  . Insulin Glargine (BASAGLAR KWIKPEN) 100 UNIT/ML SOPN Inject 15 Units into the skin daily. Submitted to [employer] for assistance 10/06/2018: Reports taking 14 units each morning  . NEXIUM 40 MG capsule Take 1 capsule (40 mg total) by mouth daily before breakfast.   . albuterol (PROVENTIL) (2.5 MG/3ML) 0.083% nebulizer solution Take 3 mLs (2.5 mg total) by nebulization every 6 (six) hours as needed for wheezing or shortness of breath.   [name] Kitchen albuterol (VENTOLIN HFA) 108 (90 Base) MCG/ACT inhaler Inhale 2 puffs into the lungs every 4 (four) hours as needed for wheezing or shortness of breath.   . allopurinol (ZYLOPRIM) 100 MG tablet Take 2 tablets (200 mg total) by mouth  daily.   [name] Kitchen aspirin 81 MG chewable tablet Chew 81 mg by mouth daily.   [name] Kitchen atorvastatin (LIPITOR) 20 MG tablet Take 1 tablet (20 mg total) by mouth daily.   . blood glucose meter kit and supplies KIT Brand of choice; LON 99 months; check FSBS 3x a day; E11.65; disp one meter with 100 strips + 1 refill of strips   . Blood Glucose Monitoring Suppl (ONE TOUCH ULTRA 2) w/Device KIT Use to check blood glucose up to 3 x daily   . bumetanide (BUMEX) 1 MG tablet Take 1 mg by mouth 2 (two) times daily. Per Dr. Juleen China 10/06/2018: Taking 2 mg (2 tablets) each morning and 1 mg (1 tablet) each afternoon (as last prescribed by Cardiology)  . carvedilol (COREG) 25 MG tablet Take 1 tablet (25 mg total) by mouth 2 (two) times daily with a meal.   . Insulin Pen Needle (B-D UF III MINI PEN NEEDLES) 31G X 5 MM MISC 1 each by Does not apply route daily. Use once daily with Victoza.   . Lancets (ONETOUCH ULTRASOFT) lancets Check blood glucose up to 3 x daily   . lisinopril (ZESTRIL) 2.5 MG tablet Take 1 tablet (2.5 mg total) by mouth daily.   . magnesium oxide (MAG-OX) 400 MG tablet Take 1 tablet by mouth daily.   . metolazone (ZAROXOLYN) 5 MG tablet Take 5 mg by mouth daily. as needed (for weight gain, as directed by your provider).   . ONE TOUCH ULTRA TEST test strip Check sugar 3 x daily   .  simethicone (MYLICON) 441 MG chewable tablet Chew 125 mg by mouth every 6 (six) hours as needed for flatulence.   . traMADol (ULTRAM) 50 MG tablet Take 1-2 tablets (50-100 mg total) by mouth every 12 (twelve) hours as needed.   . vitamin C (ASCORBIC ACID) 500 MG tablet Take 1,000 mg by mouth daily.    No facility-administered encounter medications on file as of 01/10/2019.    Goals Addressed   Current Barriers:  . Financial Barriers  . Lack of blood sugar or blood pressure results for the clinical team  Pharmacist Clinical Goal(s):  [name] Kitchen Over the next 30 days, patient will work with CM Pharmacist to address needs related to  medication therapy optimization  Interventions: . Counsel on importance of continued blood sugar control and monitoring o Reports recent CBGs ranging: 100-150s   Denies any low blood sugars o Reports taking:   Trulicity 7.12 mg weekly  Basaglar 14 units once daily. . Will collaborate with [place] to assist patient with applying for patient assistance for Trulicity and Engineer, agricultural through [employer] for 2021 calendar year o Review with patient importance of picking up program medications in timely manner if requesting medications be shipped to office . Reports GERD symptoms improved with use of brand name Nexium 40 mg daily. Patient previously using generic form . Coordination of care - discuss importance of keeping upcoming appointments with Nephrology and Cardiology  Patient Self Care Activities:  . Self administers medications . Attends all scheduled provider appointments o Next appointment with Nephrology: 12/30 o Next appointment with Cardiology: 1/4 . Calls pharmacy for medication refills . Calls provider office for new concerns or questions  . Patient to check daily blood sugars, including fasting blood sugars, and keep log   Please see past updates related to this goal by clicking on the "Past Updates" button in the selected goal      Plan  The care management team will reach out to the patient again over the next 30 days.   [name], PharmD, [place] [employer] [phone]

## 2019-01-10 NOTE — Patient Instructions (Signed)
Thank you allowing the Chronic Care Management Team to be a part of your care! It was a pleasure speaking with you today!     CCM (Chronic Care Management) Team    Janci Minor RN, BSN Nurse Care Coordinator  [phone]   [name] PharmD  Clinical Pharmacist  ([place] LCSW Clinical Social Worker [phone]  Visit Information  Goals Addressed   Current Barriers:  . Financial Barriers  . Lack of blood sugar or blood pressure results for the clinical team  Pharmacist Clinical Goal(s):  [name] Kitchen Over the next 30 days, patient will work with CM Pharmacist to address needs related to medication therapy optimization  Interventions: . Counsel on importance of continued blood sugar control and monitoring o Reports recent CBGs ranging: 100-150s   Denies any low blood sugars o Reports taking:   Trulicity [id number] mg weekly  Basaglar 14 units once daily. . Will collaborate with [place] to assist patient with applying for patient assistance for Trulicity and Engineer, agricultural through [employer] for 2021 calendar year o Review with patient importance of picking up program medications in timely manner if requesting medications be shipped to office . Reports GERD symptoms improved with use of brand name Nexium 40 mg daily. Patient previously using generic form . Coordination of care - discuss importance of keeping upcoming appointments with Nephrology and Cardiology  Patient Self Care Activities:  . Self administers medications . Attends all scheduled provider appointments o Next appointment with Nephrology: 12/30 o Next appointment with Cardiology: 1/4 . Calls pharmacy for medication refills . Calls provider office for new concerns or questions  . Patient to check daily blood sugars, including fasting blood sugars, and keep log   Please see past updates related to this goal by clicking on the "Past Updates" button in the selected goal      The patient  verbalized understanding of instructions provided today and declined a print copy of patient instruction materials.   The care management team will reach out to the patient again over the next 30 days.   [name], PharmD, [place] [employer] [phone]

## 2019-01-12 ENCOUNTER — Other Ambulatory Visit: Payer: Self-pay | Admitting: Pharmacy Technician

## 2019-01-12 NOTE — Patient Outreach (Signed)
[facility]) Care Management  01/12/2019  DOCTOR COBAS 12-28-1965 [id number]                                        Medication Assistance Referral  Referral From: [place] RPh Dorthula Perfect    Medication/Company: Nancee Liter and Trulicity / [name] Patient application portion:  Mailed Provider application portion: Faxed  to Dr. [name] Provider address/fax verified via: Office website   This is for 2021 re enrollment for patient assistance    Follow up:  Will follow up with patient in 15-30 business days to confirm application(s) have been received.  Faria Casella P. Marley Charlot, [place] [facility] [phone]

## 2019-01-23 ENCOUNTER — Ambulatory Visit: Payer: Self-pay | Admitting: *Deleted

## 2019-01-23 ENCOUNTER — Telehealth: Payer: Self-pay

## 2019-01-23 NOTE — Chronic Care Management (AMB) (Signed)
  Chronic Care Management   Outreach Note  01/23/2019 Name: [name] MRN: [id number] DOB: [birth year]  Referred by: [name], DO Reason for referral : Chronic Care Management (Unsuccessful Outreach )   An unsuccessful telephone outreach was attempted today. The patient was referred to the case management team by for assistance with care management and care coordination.   Follow Up Plan: A HIPPA compliant phone message was left for the patient providing contact information and requesting a return call.  The care management team will reach out to the patient again over the next 60 days.   Merlene Morse Brad Mcgaughy RN, BSN Nurse Case Pharmacist, [facility] Care [facility]  [phone]) [employer]

## 2019-01-24 ENCOUNTER — Other Ambulatory Visit: Payer: Self-pay | Admitting: Family Medicine

## 2019-01-24 ENCOUNTER — Ambulatory Visit (INDEPENDENT_AMBULATORY_CARE_PROVIDER_SITE_OTHER): Payer: PPO

## 2019-01-24 VITALS — Ht 69.0 in | Wt 335.0 lb

## 2019-01-24 DIAGNOSIS — Z0000 Encounter for general adult medical examination without abnormal findings: Secondary | ICD-10-CM | POA: Diagnosis not present

## 2019-01-24 DIAGNOSIS — L299 Pruritus, unspecified: Secondary | ICD-10-CM

## 2019-01-24 MED ORDER — HYDROXYZINE PAMOATE 25 MG PO CAPS: 25.0000 mg | ORAL_CAPSULE | Freq: Three times a day (TID) | ORAL | 1 refills | Status: DC | PRN

## 2019-01-24 NOTE — Patient Instructions (Signed)
[name] , Thank you for taking time to come for your Medicare Wellness Visit. I appreciate your ongoing commitment to your health goals. Please review the following plan we discussed and let me know if I can assist you in the future.   Screening recommendations/referrals: Colonoscopy: cologuard ordered  Recommended yearly ophthalmology/optometry visit for glaucoma screening and checkup Recommended yearly dental visit for hygiene and checkup  Vaccinations: Influenza vaccine: due now  Pneumococcal vaccine: up to date Tdap vaccine: due now  Shingles vaccine: shingrix eligible     Advanced directives: Advance directive discussed with you today. Once this is complete please bring a copy in to our office so we can scan it into your chart.  Conditions/risks identified: diabetic   Next appointment: follow up in one year for your annual wellness visit   Preventive Care 40-64 Years, Male Preventive care refers to lifestyle choices and visits with your health care provider that can promote health and wellness. What does preventive care include?  A yearly physical exam. This is also called an annual well check.  Dental exams once or twice a year.  Routine eye exams. Ask your health care provider how often you should have your eyes checked.  Personal lifestyle choices, including:  Daily care of your teeth and gums.  Regular physical activity.  Eating a healthy diet.  Avoiding tobacco and drug use.  Limiting alcohol use.  Practicing safe sex.  Taking low-dose aspirin every day starting at age 64. What happens during an annual well check? The services and screenings done by your health care provider during your annual well check will depend on your age, overall health, lifestyle risk factors, and family history of disease. Counseling  Your health care provider may ask you questions about your:  Alcohol use.  Tobacco use.  Drug use.  Emotional well-being.  Home and  relationship well-being.  Sexual activity.  Eating habits.  Work and work Statistician. Screening  You may have the following tests or measurements:  Height, weight, and BMI.  Blood pressure.  Lipid and cholesterol levels. These may be checked every 5 years, or more frequently if you are over 47 years old.  Skin check.  Lung cancer screening. You may have this screening every year starting at age 46 if you have a 30-pack-year history of smoking and currently smoke or have quit within the past 15 years.  Fecal occult blood test (FOBT) of the stool. You may have this test every year starting at age 26.  Flexible sigmoidoscopy or colonoscopy. You may have a sigmoidoscopy every 5 years or a colonoscopy every 10 years starting at age 55.  Prostate cancer screening. Recommendations will vary depending on your family history and other risks.  Hepatitis C blood test.  Hepatitis B blood test.  Sexually transmitted disease (STD) testing.  Diabetes screening. This is done by checking your blood sugar (glucose) after you have not eaten for a while (fasting). You may have this done every 1-3 years. Discuss your test results, treatment options, and if necessary, the need for more tests with your health care provider. Vaccines  Your health care provider may recommend certain vaccines, such as:  Influenza vaccine. This is recommended every year.  Tetanus, diphtheria, and acellular pertussis (Tdap, Td) vaccine. You may need a Td booster every 10 years.  Zoster vaccine. You may need this after age 72.  Pneumococcal 13-valent conjugate (PCV13) vaccine. You may need this if you have certain conditions and have not been vaccinated.  Pneumococcal polysaccharide (PPSV23) vaccine. You may need one or two doses if you smoke cigarettes or if you have certain conditions. Talk to your health care provider about which screenings and vaccines you need and how often you need them. This information is  not intended to replace advice given to you by your health care provider. Make sure you discuss any questions you have with your health care provider. Document Released: 02/08/2015 Document Revised: 10/02/2015 Document Reviewed: 11/13/2014 Elsevier Interactive Patient Education  2017 [place] Prevention in the Home Falls can cause injuries. They can happen to people of all ages. There are many things you can do to make your home safe and to help prevent falls. What can I do on the outside of my home?  Regularly fix the edges of walkways and driveways and fix any cracks.  Remove anything that might make you trip as you walk through a door, such as a raised step or threshold.  Trim any bushes or trees on the path to your home.  Use bright outdoor lighting.  Clear any walking paths of anything that might make someone trip, such as rocks or tools.  Regularly check to see if handrails are loose or broken. Make sure that both sides of any steps have handrails.  Any raised decks and porches should have guardrails on the edges.  Have any leaves, snow, or ice cleared regularly.  Use sand or salt on walking paths during winter.  Clean up any spills in your garage right away. This includes oil or grease spills. What can I do in the bathroom?  Use night lights.  Install grab bars by the toilet and in the tub and shower. Do not use towel bars as grab bars.  Use non-skid mats or decals in the tub or shower.  If you need to sit down in the shower, use a plastic, non-slip stool.  Keep the floor dry. Clean up any water that spills on the floor as soon as it happens.  Remove soap buildup in the tub or shower regularly.  Attach bath mats securely with double-sided non-slip rug tape.  Do not have throw rugs and other things on the floor that can make you trip. What can I do in the bedroom?  Use night lights.  Make sure that you have a light by your bed that is easy to reach.   Do not use any sheets or blankets that are too big for your bed. They should not hang down onto the floor.  Have a firm chair that has side arms. You can use this for support while you get dressed.  Do not have throw rugs and other things on the floor that can make you trip. What can I do in the kitchen?  Clean up any spills right away.  Avoid walking on wet floors.  Keep items that you use a lot in easy-to-reach places.  If you need to reach something above you, use a strong step stool that has a grab bar.  Keep electrical cords out of the way.  Do not use floor polish or wax that makes floors slippery. If you must use wax, use non-skid floor wax.  Do not have throw rugs and other things on the floor that can make you trip. What can I do with my stairs?  Do not leave any items on the stairs.  Make sure that there are handrails on both sides of the stairs and use them. Fix handrails that  are broken or loose. Make sure that handrails are as long as the stairways.  Check any carpeting to make sure that it is firmly attached to the stairs. Fix any carpet that is loose or worn.  Avoid having throw rugs at the top or bottom of the stairs. If you do have throw rugs, attach them to the floor with carpet tape.  Make sure that you have a light switch at the top of the stairs and the bottom of the stairs. If you do not have them, ask someone to add them for you. What else can I do to help prevent falls?  Wear shoes that:  Do not have high heels.  Have rubber bottoms.  Are comfortable and fit you well.  Are closed at the toe. Do not wear sandals.  If you use a stepladder:  Make sure that it is fully opened. Do not climb a closed stepladder.  Make sure that both sides of the stepladder are locked into place.  Ask someone to hold it for you, if possible.  Clearly mark and make sure that you can see:  Any grab bars or handrails.  First and last steps.  Where the edge of  each step is.  Use tools that help you move around (mobility aids) if they are needed. These include:  Canes.  Walkers.  Scooters.  Crutches.  Turn on the lights when you go into a dark area. Replace any light bulbs as soon as they burn out.  Set up your furniture so you have a clear path. Avoid moving your furniture around.  If any of your floors are uneven, fix them.  If there are any pets around you, be aware of where they are.  Review your medicines with your doctor. Some medicines can make you feel dizzy. This can increase your chance of falling. Ask your doctor what other things that you can do to help prevent falls. This information is not intended to replace advice given to you by your health care provider. Make sure you discuss any questions you have with your health care provider. Document Released: 11/08/2008 Document Revised: 06/20/2015 Document Reviewed: 02/16/2014 Elsevier Interactive Patient Education  2017 [employer].

## 2019-01-24 NOTE — Progress Notes (Signed)
Subjective:   [name] is a 53 y.o. male who presents for an Initial Medicare Annual Wellness Visit.  Review of Systems   Cardiac Risk Factors include: advanced age (>41mn, >>43women);male gender;dyslipidemia;diabetes mellitus;hypertension;obesity (BMI >30kg/m2)     Objective:    Today's Vitals   01/24/19 1339  Weight: (!) 335 lb (152 kg)  Height: _0  (1.753 m)   Body mass index is 49.47 kg/m.  Advanced Directives 01/24/2019 03/11/2018 02/28/2018 01/05/2018 10/04/2016  Does Patient Have a Medical Advance Directive? _1   Would patient like information on creating a medical advance directive? - Yes (MAU/Ambulatory/Procedural Areas - Information given) No - Patient declined No - Patient declined -    Current Medications (verified) Outpatient Encounter Medications as of 01/24/2019  Medication Sig  . albuterol (PROVENTIL) (2.5 MG/3ML) 0.083% nebulizer solution Take 3 mLs (2.5 mg total) by nebulization every 6 (six) hours as needed for wheezing or shortness of breath.  . allopurinol (ZYLOPRIM) 100 MG tablet Take 2 tablets (200 mg total) by mouth daily.  .[name] Kitchenaspirin 81 MG chewable tablet Chew 81 mg by mouth daily.  .[name] Kitchenatorvastatin (LIPITOR) 20 MG tablet Take 1 tablet (20 mg total) by mouth daily.  . blood glucose meter kit and supplies KIT Brand of choice; LON 99 months; check FSBS 3x a day; E11.65; disp one meter with 100 strips + 1 refill of strips  . Blood Glucose Monitoring Suppl (ONE TOUCH ULTRA 2) w/Device KIT Use to check blood glucose up to 3 x daily  . bumetanide (BUMEX) 1 MG tablet Take 1 mg by mouth 2 (two) times daily. Per Dr. KJuleen China . carvedilol (COREG) 25 MG tablet Take 1 tablet (25 mg total) by mouth 2 (two) times daily with a meal.  . CLARITIN 10 MG tablet Take 1 tablet (10 mg total) by mouth daily. One po daily x 5 days  . Dulaglutide (TRULICITY) 04.19MQQ/2.2LNSOPN Inject 0.75 mg into the skin once a week.  . Insulin Glargine (BASAGLAR KWIKPEN) 100  UNIT/ML SOPN Inject 15 Units into the skin daily. Submitted to [employer] assistance  . Insulin Pen Needle (B-D UF III MINI PEN NEEDLES) 31G X 5 MM MISC 1 each by Does not apply route daily. Use once daily with Victoza.  . Lancets (ONETOUCH ULTRASOFT) lancets Check blood glucose up to 3 x daily  . lisinopril (ZESTRIL) 2.5 MG tablet Take 1 tablet (2.5 mg total) by mouth daily.  . magnesium oxide (MAG-OX) 400 MG tablet Take 1 tablet by mouth daily.  .[name] KitchenNEXIUM 40 MG capsule Take 1 capsule (40 mg total) by mouth daily before breakfast.  . ONE TOUCH ULTRA TEST test strip Check sugar 3 x daily  . simethicone (MYLICON) 1989MG chewable tablet Chew 125 mg by mouth every 6 (six) hours as needed for flatulence.  . traMADol (ULTRAM) 50 MG tablet Take 1-2 tablets (50-100 mg total) by mouth every 12 (twelve) hours as needed.  . vitamin C (ASCORBIC ACID) 500 MG tablet Take 1,000 mg by mouth daily.  .[name] Kitchenalbuterol (VENTOLIN HFA) 108 (90 Base) MCG/ACT inhaler Inhale 2 puffs into the lungs every 4 (four) hours as needed for wheezing or shortness of breath. (Patient not taking: Reported on 01/24/2019)  . metolazone (ZAROXOLYN) 5 MG tablet Take 5 mg by mouth daily. as needed (for weight gain, as directed by your provider).   No facility-administered encounter medications on file as of 01/24/2019.    Allergies (verified)  Patient has no known allergies.   History: Past Medical History:  Diagnosis Date  . Allergy   . Asthma   . Gout   . Hypertension   . Morbid obesity ([place])    History reviewed. No pertinent surgical history. Family History  Problem Relation Age of Onset  . Diabetes Mother   . Hypertension Mother   . Alcohol abuse Father   . Glaucoma Father   . Diabetes Father   . Hypertension Father   . Cancer Neg Hx   . COPD Neg Hx   . Heart disease Neg Hx   . Stroke Neg Hx    Social History   Socioeconomic History  . Marital status: Single    Spouse name: Not on file  . Number of children:  Not on file  . Years of education: Not on file  . Highest education level: Not on file  Occupational History  . Not on file  Tobacco Use  . Smoking status: Never Smoker  . Smokeless tobacco: Never Used  Substance and Sexual Activity  . Alcohol use: Yes    Comment: a glass of wine and a couple beers a day  . Drug use: No  . Sexual activity: Not on file  Other Topics Concern  . Not on file  Social History Narrative  . Not on file   Social Determinants of Health   Financial Resource Strain:   . Difficulty of Paying Living Expenses: Not on file  Food Insecurity:   . Worried About Charity fundraiser in the Last Year: Not on file  . Ran Out of Food in the Last Year: Not on file  Transportation Needs:   . Lack of Transportation (Medical): Not on file  . Lack of Transportation (Non-Medical): Not on file  Physical Activity:   . Days of Exercise per Week: Not on file  . Minutes of Exercise per Session: Not on file  Stress:   . Feeling of Stress : Not on file  Social Connections:   . Frequency of Communication with Friends and Family: Not on file  . Frequency of Social Gatherings with Friends and Family: Not on file  . Attends Religious Services: Not on file  . Active Member of Clubs or Organizations: Not on file  . Attends Archivist Meetings: Not on file  . Marital Status: Not on file   Tobacco Counseling Counseling given: Not Answered   Clinical Intake:  Pre-visit preparation completed: Yes  Pain : No/denies pain     Nutritional Status: BMI > 30  Obese Nutritional Risks: None Diabetes: Yes CBG done?: No Did pt. bring in CBG monitor from home?: No  How often do you need to have someone help you when you read instructions, pamphlets, or other written materials from your doctor or pharmacy?: 1 - Never  Nutrition Risk Assessment:  Has the patient had any N/V/D within the last 2 months?  No  Does the patient have any non-healing wounds?  No  Has the  patient had any unintentional weight loss or weight gain?  No   Diabetes:  Is the patient diabetic?  Yes  If diabetic, was a CBG obtained today?  No  Did the patient bring in their glucometer from home?  No  How often do you monitor your CBG's? 1-2 times a day    Financial Strains and Diabetes Management:  Already in contact with CCM team.   Diabetic Exams:  Diabetic Eye Exam: Completed 10/04/2018.  Diabetic Foot Exam: Pt has been advised about the importance in completing this exam.   Interpreter Needed?: No  Information entered by :: Jorma Tassinari,LPN  Activities of Daily Living In your present state of health, do you have any difficulty performing the following activities: 01/24/2019 02/28/2018  Hearing? N N  Comment no hearing aids -  Vision? N N  Comment [facility] -  Difficulty concentrating or making decisions? N N  Walking or climbing stairs? Y N  Dressing or bathing? N N  Doing errands, shopping? N N  Preparing Food and eating ? N -  Using the Toilet? N -  In the past six months, have you accidently leaked urine? N -  Do you have problems with loss of bowel control? N -  Managing your Medications? N -  Managing your Finances? N -  Housekeeping or managing your Housekeeping? N -  Some recent data might be hidden     Immunizations and Health Maintenance Immunization History  Administered Date(s) Administered  . Influenza, Seasonal, Injecte, Preservative Fre 11/30/2007  . Influenza,inj,Quad PF,6+ Mos 01/17/2015, 10/15/2016  . Influenza,inj,quad, With Preservative 11/11/2017  . Influenza-Unspecified 01/03/2014, 10/22/2015, 11/09/2017  . PPD Test 07/02/2015  . Pneumococcal Polysaccharide-23 01/17/2015  . Td 02/14/2003   Health Maintenance Due  Topic Date Due  . Fecal DNA (Cologuard)  11/16/2016  . FOOT EXAM  06/16/2018    Patient Care Team: [name], DO as PCP - General (Family Medicine) [place], [name], [facility] as  Pharmacist [name], LCSW as Social Worker (Licensed Clinical Social Worker) Minor, Dalbert Garnet, RN as Case Leisure centre manager, Luiz Ochoa, CPhT as [employer], corporate)  Indicate any recent [place] you may have received from other than [facility] providers in the past year (date may be approximate).    Assessment:   This is a routine wellness examination for Gee.  Hearing/Vision screen No exam data present  Dietary issues and exercise activities discussed: Current Exercise Habits: Home exercise routine, Type of exercise: walking, Time (Minutes): 30, Frequency (Times/Week): 7, Weekly Exercise (Minutes/Week): 210, Intensity: Mild, Exercise limited by: None identified  Goals    . "I want to know what resources are available to me" (pt-stated)     Current Barriers:  . Financial constraints . Limited social support . Limited access to food . ADL IADL limitations . Social Isolation . Limited access to caregiver . Lacks knowledge of community resource: financial assistance resources and personal care service resources that are available to him   Clinical Social Work Clinical Goal(s):  [name] Kitchen Over the next 90 days, patient will work with LCSW to address needs related to lack of support within the home . Over the next 90 days, client will follow up with financial assistance programs/resources* as directed by SW  Interventions: . Patient interviewed and appropriate assessments performed . Provided patient with information about crisis support and financial support resources within [facility] . Update-Patient reports receiving his first EBT card in the mail. Patient reports being eligible for $190.00 per month in food stamps. Patient reports that this will help him with managing his finances better.  . Discussed plans with patient for ongoing care management follow up and provided patient with direct contact information for care management  team . Advised patient to check email for community resources that were sent out on 05/26/2018. Patient confirmed receiving resources and education was provided again on these various resource options today on 6/18 . Assisted  patient/caregiver with obtaining information about health plan benefits  Patient Self Care Activities:  . Attends all scheduled provider appointments . Calls provider office for new concerns or questions  Please see past updates related to this goal by clicking on the "Past Updates" button in the selected goal      . I am trying to stay healthy (pt-stated)     Current Barriers:  . Chronic Disease Management support and education needs related to managing multiple chronic disease processes  Nurse Case Manager Clinical Goal(s):  [name] Kitchen Over the next 90 days, patient will work with [facility]  to address needs related to managing multiple chronic disease processes ie  DM2, COPD, HF.   Interventions:  . Advised patient to think about weight loss options . Collaborated with Grayland Ormond Pharm-D regarding patient's current medication regimen . Advised patient, providing education and rationale, to weigh daily and record, calling Cardiologist for weight gain of 3lbs overnight or 5 pounds in a week.  . Reviewed sodium and fluid restrictions set by cardiology.   Patient Self Care Activities:  . Currently UNABLE TO independently and effectively manage multiple chronic disease pocesses   Initial goal documentation     . PharmD - Medication Management     Current Barriers:  . Financial Barriers  . Lack of blood sugar or blood pressure results for the clinical team  Pharmacist Clinical Goal(s):  [name] Kitchen Over the next 30 days, patient will work with CM Pharmacist to address needs related to medication therapy optimization  Interventions: . Counsel on importance of continued blood sugar control and monitoring o Reports recent CBGs ranging: 100-150s   Denies any low blood sugars o Reports  taking:   Trulicity 7.42 mg weekly  Basaglar 14 units once daily. . Will collaborate with [place] to assist patient with applying for patient assistance for Trulicity and Engineer, agricultural through [employer] for 2021 calendar year o Review with patient importance of picking up program medications in timely manner if requesting medications be shipped to office . Reports GERD symptoms improved with use of brand name Nexium 40 mg daily. Patient previously using generic form . Coordination of care - discuss importance of keeping upcoming appointments with Nephrology and Cardiology  Patient Self Care Activities:  . Self administers medications . Attends all scheduled provider appointments o Next appointment with Nephrology: 12/30 o Next appointment with Cardiology: 1/4 . Calls pharmacy for medication refills . Calls provider office for new concerns or questions  . Patient to check daily blood sugars, including fasting blood sugars, and keep log   Please see past updates related to this goal by clicking on the "Past Updates" button in the selected goal        Depression Screen PHQ 2/9 Scores 01/24/2019 01/02/2019 06/08/2018 03/08/2018  PHQ - 2 Score 0 0 0 0    Fall Risk Fall Risk  01/24/2019 01/02/2019 06/08/2018 03/08/2018 12/02/2017  Falls in the past year? 0 0 0 0 0  Number falls in past yr: 0 0 - - -  Injury with Fall? 0 - - - -  Follow up - Falls evaluation completed Falls evaluation completed Falls evaluation completed -    FALL RISK PREVENTION PERTAINING TO THE HOME:  Any stairs in or around the home? No  If so, are there any without handrails? No   Home free of loose throw rugs in walkways, pet beds, electrical cords, etc? Yes  Adequate lighting in your home to reduce risk of falls? Yes  ASSISTIVE DEVICES UTILIZED TO PREVENT FALLS:  Life alert? No  Use of a cane, walker or w/c? No  Grab bars in the bathroom? No  Shower chair or bench in shower? No  Elevated toilet seat  or a handicapped toilet? No    TIMED UP AND GO:    Cognitive Function:        Screening Tests Health Maintenance  Topic Date Due  . Fecal DNA (Cologuard)  11/16/2016  . FOOT EXAM  06/16/2018  . INFLUENZA VACCINE  04/26/2019 (Originally 08/27/2018)  . TETANUS/TDAP  02/13/2023 (Originally 02/13/2013)  . HEMOGLOBIN A1C  04/04/2019  . OPHTHALMOLOGY EXAM  10/04/2019  . PNEUMOCOCCAL POLYSACCHARIDE VACCINE AGE 61-64 HIGH RISK  Completed  . HIV Screening  Completed    Qualifies for Shingles Vaccine? No  Zostavax completed n/a. Due for Shingrix. Education has been provided regarding the importance of this vaccine. Pt has been advised to call insurance company to determine out of pocket expense. Advised may also receive vaccine at local pharmacy or Health Dept. Verbalized acceptance and understanding.  Tdap: Discussed need for TD/TDAP vaccine, patient verbalized understanding that this is not covered as a preventative with there insurance and to call the office if he develops any new skin injuries, ie: cuts, scrapes, bug bites, or open wounds.  Flu Vaccine: declined   Pneumococcal Vaccine: up to date   Cancer Screenings:  Colorectal Screening: cologuard ordered   Lung Cancer Screening: (Low Dose CT Chest recommended if Age 67-80 years, 30 pack-year currently smoking OR have quit w/in 15years.) does not qualify.    Additional Screening:  Hepatitis C Screening: does not qualify  Vision Screening: Recommended annual ophthalmology exams for early detection of glaucoma and other disorders of the eye. Is the patient up to date with their annual eye exam?  Yes  Who is the provider or what is the name of the office in which the pt attends annual eye exams? [facility]   Dental Screening: Recommended annual dental exams for proper oral hygiene  Community Resource Referral:  CRR required this visit?  No        Plan:  I have personally reviewed and addressed the Medicare  Annual Wellness questionnaire and have noted the following in the patient's chart:  A. Medical and social history B. Use of alcohol, tobacco or illicit drugs  C. Current medications and supplements D. Functional ability and status E.  Nutritional status F.  Physical activity G. Advance directives H. List of other physicians I.  Hospitalizations, surgeries, and ER visits in previous 12 months J.  [place] such as hearing and vision if needed, cognitive and depression L. Referrals and appointments   In addition, I have reviewed and discussed with patient certain preventive protocols, quality metrics, and best practice recommendations. A written personalized care plan for preventive services as well as general preventive health recommendations were provided to patient.   Signed,    Bevelyn Ngo, LPN   [id number]  Nurse Health Advisor    Nurse Notes: patient requesting a rx for vitamin c to be covered through insurance.   Requesting for refill on simethicone.   Patient is requesting to try vistaril for his nerves/ itching. Patient states he is scratching so much and feels like it bruises from scratching so hard.

## 2019-01-30 DIAGNOSIS — I10 Essential (primary) hypertension: Secondary | ICD-10-CM | POA: Diagnosis not present

## 2019-01-30 DIAGNOSIS — E1129 Type 2 diabetes mellitus with other diabetic kidney complication: Secondary | ICD-10-CM | POA: Diagnosis not present

## 2019-01-30 DIAGNOSIS — I50813 Acute on chronic right heart failure: Secondary | ICD-10-CM | POA: Diagnosis not present

## 2019-01-30 DIAGNOSIS — E785 Hyperlipidemia, unspecified: Secondary | ICD-10-CM | POA: Diagnosis not present

## 2019-01-30 DIAGNOSIS — N183 Chronic kidney disease, stage 3 unspecified: Secondary | ICD-10-CM | POA: Diagnosis not present

## 2019-01-30 DIAGNOSIS — E1165 Type 2 diabetes mellitus with hyperglycemia: Secondary | ICD-10-CM | POA: Diagnosis not present

## 2019-01-30 DIAGNOSIS — Z720 Tobacco use: Secondary | ICD-10-CM | POA: Diagnosis not present

## 2019-01-30 DIAGNOSIS — I5022 Chronic systolic (congestive) heart failure: Secondary | ICD-10-CM | POA: Diagnosis not present

## 2019-02-06 ENCOUNTER — Other Ambulatory Visit: Payer: Self-pay | Admitting: Pharmacy Technician

## 2019-02-06 NOTE — Patient Outreach (Signed)
[facility]) Care Management  02/06/2019  [name] [birth year] [id number]    Successful call placed to patient regarding patient assistance application(s) for Basaglar and Trulicity with [employer] , HIPAA identifiers verified.   Patient informed he has received the applications in the mail. Patient denied have any questions or concerns about the application. Patient informed he would mail them back at his convenience but was unsure of when he may do that.   Follow up:  Will route note to embedded [facility] RPh [name] for case closure if document(s) have not been received in the next 15 business days.  [name], [place] Management [phone]

## 2019-02-09 ENCOUNTER — Telehealth: Payer: Self-pay

## 2019-02-09 DIAGNOSIS — I10 Essential (primary) hypertension: Secondary | ICD-10-CM

## 2019-02-09 MED ORDER — CARVEDILOL 25 MG PO TABS: 50.0000 mg | ORAL_TABLET | Freq: Two times a day (BID) | ORAL | 1 refills | Status: DC

## 2019-02-09 NOTE — Telephone Encounter (Signed)
As per patient he is taking carvedilol (COREG) 25 MG two tablet twice a day and ran out but as per his meds list, he supposed to take one tablet twice daily. Please suggest ?

## 2019-02-09 NOTE — Telephone Encounter (Signed)
Changed rx to Carvedilol 25mg  x2 = 50mg  twice a day.  His prior Cardiology note did say 50mg  twice a day.  He can continue 2 tabs twice a day  New rx sent  [name], [facility] 02/09/2019, 6:08 PM

## 2019-02-10 ENCOUNTER — Telehealth: Payer: Self-pay

## 2019-02-10 NOTE — Telephone Encounter (Signed)
Left detail message. 

## 2019-02-14 ENCOUNTER — Ambulatory Visit: Payer: Self-pay | Admitting: Pharmacist

## 2019-02-14 ENCOUNTER — Other Ambulatory Visit: Payer: Self-pay | Admitting: Pharmacy Technician

## 2019-02-14 DIAGNOSIS — E1122 Type 2 diabetes mellitus with diabetic chronic kidney disease: Secondary | ICD-10-CM

## 2019-02-14 NOTE — Chronic Care Management (AMB) (Signed)
Chronic Care Management   Follow Up Note   02/14/2019 Name: [name] MRN: [id number] DOB: 08-07-1965  Referred by: [name], DO Reason for referral : Chronic Care Management (Patient Phone Call) and [place] is a 54 y.o. year old male who is a primary care patient of [name], DO. The CCM team was consulted for assistance with chronic disease management and care coordination needs.    Coordination of care with [facility] CPhT regarding patient's medication assistance application.  Was unable to reach patient via telephone today and have left HIPAA compliant voicemail asking patient to return my call.    Review of patient status, including review of consultants reports, relevant laboratory and other test results, and collaboration with appropriate care team members and the patient's provider was performed as part of comprehensive patient evaluation and provision of chronic care management services.     Outpatient Encounter Medications as of 02/14/2019  Medication Sig Note  . albuterol (PROVENTIL) (2.5 MG/3ML) 0.083% nebulizer solution Take 3 mLs (2.5 mg total) by nebulization every 6 (six) hours as needed for wheezing or shortness of breath.   [name] Kitchen albuterol (VENTOLIN HFA) 108 (90 Base) MCG/ACT inhaler Inhale 2 puffs into the lungs every 4 (four) hours as needed for wheezing or shortness of breath. (Patient not taking: Reported on 01/24/2019)   . allopurinol (ZYLOPRIM) 100 MG tablet Take 2 tablets (200 mg total) by mouth daily.   [name] Kitchen aspirin 81 MG chewable tablet Chew 81 mg by mouth daily.   [name] Kitchen atorvastatin (LIPITOR) 20 MG tablet Take 1 tablet (20 mg total) by mouth daily.   . blood glucose meter kit and supplies KIT Brand of choice; LON 99 months; check FSBS 3x a day; E11.65; disp one meter with 100 strips + 1 refill of strips   . Blood Glucose Monitoring Suppl (ONE TOUCH ULTRA 2) w/Device KIT Use to check blood glucose up to 3 x  daily   . bumetanide (BUMEX) 1 MG tablet Take 1 mg by mouth 2 (two) times daily. Per Dr. Juleen China 10/06/2018: Taking 2 mg (2 tablets) each morning and 1 mg (1 tablet) each afternoon (as last prescribed by Cardiology)  . carvedilol (COREG) 25 MG tablet Take 2 tablets (50 mg total) by mouth 2 (two) times daily with a meal.   . CLARITIN 10 MG tablet Take 1 tablet (10 mg total) by mouth daily. One po daily x 5 days   . Dulaglutide (TRULICITY) 4.26 ST/4.1DQ SOPN Inject 0.75 mg into the skin once a week. 07/20/2018: Taking on Fridays  . hydrOXYzine (VISTARIL) 25 MG capsule Take 1 capsule (25 mg total) by mouth every 8 (eight) hours as needed for itching.   . Insulin Glargine (BASAGLAR KWIKPEN) 100 UNIT/ML SOPN Inject 15 Units into the skin daily. Submitted to [employer] for assistance 10/06/2018: Reports taking 14 units each morning  . Insulin Pen Needle (B-D UF III MINI PEN NEEDLES) 31G X 5 MM MISC 1 each by Does not apply route daily. Use once daily with Victoza.   . Lancets (ONETOUCH ULTRASOFT) lancets Check blood glucose up to 3 x daily   . lisinopril (ZESTRIL) 2.5 MG tablet Take 1 tablet (2.5 mg total) by mouth daily.   . magnesium oxide (MAG-OX) 400 MG tablet Take 1 tablet by mouth daily.   . metolazone (ZAROXOLYN) 5 MG tablet Take 5 mg by mouth daily. as needed (for weight gain, as directed by your provider).   [name] Kitchen  NEXIUM 40 MG capsule Take 1 capsule (40 mg total) by mouth daily before breakfast.   . ONE TOUCH ULTRA TEST test strip Check sugar 3 x daily   . simethicone (MYLICON) 161 MG chewable tablet Chew 125 mg by mouth every 6 (six) hours as needed for flatulence.   . traMADol (ULTRAM) 50 MG tablet Take 1-2 tablets (50-100 mg total) by mouth every 12 (twelve) hours as needed.   . vitamin C (ASCORBIC ACID) 500 MG tablet Take 1,000 mg by mouth daily.    No facility-administered encounter medications on file as of 02/14/2019.    Goals Addressed            This Visit's Progress   . PharmD -  Medication Management       Current Barriers:  . Financial Barriers  . Lack of blood sugar or blood pressure results for the clinical team  Pharmacist Clinical Goal(s):  [name] Kitchen Over the next 30 days, patient will work with CM Pharmacist to address needs related to medication therapy optimization  Interventions: . Receive coordination of care call from [facility]. L[facility] Garcia. Reported patient has misplaced [employer] application previously received via mail from [place] and has questions regarding logistics of receiving a new application. . Unable to reach patient via telephone today and left HIPAA compliant voicemail asking patient to return my call.  . Received voicemail back from patient and make second unsuccessful attempt to reach patient.  Patient Self Care Activities:  . Self administers medications . Calls pharmacy for medication refills . Calls provider office for new concerns or questions  . Patient to check daily blood sugars, including fasting blood sugars, and keep log   Please see past updates related to this goal by clicking on the "Past Updates" button in the selected goal         Plan  The care management team will reach out to the patient again over the next 7 days.   [name], PharmD, [place] [employer] [phone]

## 2019-02-14 NOTE — Patient Outreach (Signed)
[facility]) Care Management  02/14/2019  [name] 16-Jul-1965 [id number]   Successful outreach call placed to patient in regards to a voicemail he left for me at 8:30pm last night concerning his [place] manager for [employer] and Trulicity.  Spoke to patient, HIPAA identifiers verified.  Patient informed he has misplaced the application that he informed as having in our last phone call on 02/05/2018. Patient inquired if he could swing by the office and sign the form as none of his information has changed from last year and then I could fill in the remaining information. Informed patient that the office is limiting visitors due to the pandemic but that I would outreach embedded [place] and one of [place] would give him  a call back. Patient was agreeable to this plan.  Call placed to embedded [place] who informed she would give patient a call and be in touch with me through in basket messaging as to how to proceed.  [name] P. Lydell Moga, [place] Management [phone]

## 2019-02-15 ENCOUNTER — Ambulatory Visit (INDEPENDENT_AMBULATORY_CARE_PROVIDER_SITE_OTHER): Payer: PPO | Admitting: Pharmacist

## 2019-02-15 DIAGNOSIS — Z794 Long term (current) use of insulin: Secondary | ICD-10-CM | POA: Diagnosis not present

## 2019-02-15 DIAGNOSIS — E785 Hyperlipidemia, unspecified: Secondary | ICD-10-CM | POA: Diagnosis not present

## 2019-02-15 DIAGNOSIS — N184 Chronic kidney disease, stage 4 (severe): Secondary | ICD-10-CM | POA: Diagnosis not present

## 2019-02-15 DIAGNOSIS — E1169 Type 2 diabetes mellitus with other specified complication: Secondary | ICD-10-CM | POA: Diagnosis not present

## 2019-02-15 DIAGNOSIS — I10 Essential (primary) hypertension: Secondary | ICD-10-CM

## 2019-02-15 DIAGNOSIS — E1122 Type 2 diabetes mellitus with diabetic chronic kidney disease: Secondary | ICD-10-CM

## 2019-02-15 NOTE — Patient Instructions (Signed)
Thank you allowing the Chronic Care Management Team to be a part of your care! It was a pleasure speaking with you today!     CCM (Chronic Care Management) Team    Noreene Larsson RN, MSN, CCM Nurse Care Coordinator  [phone]   [name] PharmD  Clinical Pharmacist  [phone]   [name] LCSW Clinical Social Worker [phone]  Visit Information  Goals Addressed            This Visit's Progress   . PharmD - Medication Management       Current Barriers:  . Financial Barriers  . Lack of blood sugar or blood pressure results for the clinical team  Pharmacist Clinical Goal(s):  [name] Kitchen Over the next 30 days, patient will work with CM Pharmacist to address needs related to medication therapy optimization  Interventions: . Counsel on importance of continued blood sugar control and monitoring ? Reports recent CBGs ranging: 90-128  ? Denies any s/s of low blood sugars ? Reports taking:  ? Trulicity [id number] mg weekly ? Basaglar 14 units once daily. [name] patient to monitor home BP/HR ? Patient has home upper arm monitor ? Mr. Miyahira denies monitoring at home . Coordination of care: Follow up with patient regarding missed nephrology appointment.  ? States he just realized that he missed this appointment and is going to call office now about rescheduling . Will continue to collaborate with [place] to assist patient with applying for patient assistance for Trulicity and Engineer, agricultural through [place] for 2021 calendar year ? Mr. Tomaso states that he is not sure if he mailed back or misplaced this application that he received from [place] ? Offer to request [facility] CPhT re-send application to patient. Patient agrees. ? Collaborate with [facility] CPhT  Patient Self Care Activities:  . Self administers medications . Calls pharmacy for medication refills . Calls provider office for new concerns or questions  . Patient to check daily blood sugars, including fasting  blood sugars, and keep log   Please see past updates related to this goal by clicking on the "Past Updates" button in the selected goal         The patient verbalized understanding of instructions provided today and declined a print copy of patient instruction materials.   The care management team will reach out to the patient again over the next 30 days.   [name], PharmD, [place] [employer] [phone]

## 2019-02-15 NOTE — Chronic Care Management (AMB) (Signed)
Chronic Care Management   Follow Up Note   02/15/2019 Name: [name] MRN: [id number] DOB: 02/09/1965  Referred by: [name] Reason for referral : Chronic Care Management (Patient Phone Call)   [name] is a 54 y.o. year old male who is a primary care patient of [name]. The CCM team was consulted for assistance with chronic disease management and care coordination needs.  [name] has a past medical history including but not limited to Type 2 diabetes, hypertension, CKD, chronic systolic heart failure, gout, seasonal allergies and GERD.  I reached out to [name] by phone today.   Review of patient status, including review of consultants reports, relevant laboratory and other test results, and collaboration with appropriate care team members and the patient's provider was performed as part of comprehensive patient evaluation and provision of chronic care management services.      Outpatient Encounter Medications as of 02/15/2019  Medication Sig Note  . atorvastatin (LIPITOR) 20 MG tablet Take 1 tablet (20 mg total) by mouth daily.   . bumetanide (BUMEX) 1 MG tablet Take 1 mg by mouth 2 (two) times daily. Per Dr. Juleen China 10/06/2018: Taking 2 mg (2 tablets) each morning and 1 mg (1 tablet) each afternoon (as last prescribed by Cardiology)  . carvedilol (COREG) 25 MG tablet Take 2 tablets (50 mg total) by mouth 2 (two) times daily with a meal.   . Dulaglutide ([name]) 5.28 UX/3.2GM SOPN Inject 0.75 mg into the skin once a week. 07/20/2018: Taking on Fridays  . Insulin Glargine (BASAGLAR KWIKPEN) 100 UNIT/ML SOPN Inject 15 Units into the skin daily. Submitted to [employer] for assistance 10/06/2018: Reports taking 14 units each morning  . lisinopril (ZESTRIL) 2.5 MG tablet Take 1 tablet (2.5 mg total) by mouth daily.   . magnesium oxide (MAG-OX) 400 MG tablet Take 1 tablet by mouth daily.   [name] Kitchen albuterol (PROVENTIL) (2.5  MG/3ML) 0.083% nebulizer solution Take 3 mLs (2.5 mg total) by nebulization every 6 (six) hours as needed for wheezing or shortness of breath.   [name] Kitchen albuterol (VENTOLIN HFA) 108 (90 Base) MCG/ACT inhaler Inhale 2 puffs into the lungs every 4 (four) hours as needed for wheezing or shortness of breath. (Patient not taking: Reported on 01/24/2019)   . allopurinol (ZYLOPRIM) 100 MG tablet Take 2 tablets (200 mg total) by mouth daily.   [name] Kitchen aspirin 81 MG chewable tablet Chew 81 mg by mouth daily.   . blood glucose meter kit and supplies KIT Brand of choice; LON 99 months; check FSBS 3x a day; E11.65; disp one meter with 100 strips + 1 refill of strips   . Blood Glucose Monitoring Suppl (ONE TOUCH ULTRA 2) w/Device KIT Use to check blood glucose up to 3 x daily   . CLARITIN 10 MG tablet Take 1 tablet (10 mg total) by mouth daily. One po daily x 5 days   . hydrOXYzine (VISTARIL) 25 MG capsule Take 1 capsule (25 mg total) by mouth every 8 (eight) hours as needed for itching.   . Insulin Pen Needle (B-D UF III MINI PEN NEEDLES) 31G X 5 MM MISC 1 each by Does not apply route daily. Use once daily with Victoza.   . Lancets (ONETOUCH ULTRASOFT) lancets Check blood glucose up to 3 x daily   . metolazone (ZAROXOLYN) 5 MG tablet Take 5 mg by mouth daily. as needed (for weight gain, as directed by your provider).   [name] Kitchen  NEXIUM 40 MG capsule Take 1 capsule (40 mg total) by mouth daily before breakfast.   . ONE TOUCH ULTRA TEST test strip Check sugar 3 x daily   . simethicone (MYLICON) 419 MG chewable tablet Chew 125 mg by mouth every 6 (six) hours as needed for flatulence.   . traMADol (ULTRAM) 50 MG tablet Take 1-2 tablets (50-100 mg total) by mouth every 12 (twelve) hours as needed.   . vitamin C (ASCORBIC ACID) 500 MG tablet Take 1,000 mg by mouth daily.    No facility-administered encounter medications on file as of 02/15/2019.    Goals Addressed            This Visit's Progress   . PharmD - Medication  Management       Current Barriers:  . Financial Barriers  . Lack of blood sugar or blood pressure results for the clinical team  Pharmacist Clinical Goal(s):  [name] Kitchen Over the next 30 days, patient will work with CM Pharmacist to address needs related to medication therapy optimization  Interventions: . Counsel on importance of continued blood sugar control and monitoring ? Reports recent CBGs ranging: 90-128  ? Denies any s/s of low blood sugars ? Reports taking:  ? [name] 6.22 mg weekly ? Basaglar 14 units once daily. [name] patient to monitor home BP/HR ? Patient has home upper arm monitor ? [name] denies monitoring at home . Coordination of care: Follow up with patient regarding missed nephrology appointment.  ? States he just realized that he missed this appointment and is going to call office now about rescheduling . Will continue to collaborate with [name] to assist patient with applying for patient assistance for [name] and Engineer, agricultural through [name] for 2021 calendar year ? [name] states that he is not sure if he mailed back or misplaced this application that he received from [place] ? Offer to request [name] re-send application to patient. Patient agrees. ? Collaborate with [name]  Patient Self Care Activities:  . Self administers medications . Calls pharmacy for medication refills . Calls provider office for new concerns or questions  . Patient to check daily blood sugars, including fasting blood sugars, and keep log   Please see past updates related to this goal by clicking on the "Past Updates" button in the selected goal         Plan  The care management team will reach out to the patient again over the next 30 days.   [name], PharmD, [place] [name] [phone]

## 2019-02-16 ENCOUNTER — Other Ambulatory Visit: Payer: Self-pay | Admitting: Pharmacy Technician

## 2019-02-16 NOTE — Patient Outreach (Signed)
[name]  02/16/2019  [name] 22-Nov-1965 [id number]   Received patient assistance referral from embedded [place] for patient's renewal application with [name] for [employer] and Trulicity.  Upon embedded pharmacist's phone call with patient it was determined that the patient may have misplaced his application. It was requested that another application be mailed to him. Prepared application and placed in mail today.  Will follow up with patient in 5-10 business days to confirm he has received the 2nd mailed application.  [name], [place] Management [phone]

## 2019-02-21 ENCOUNTER — Other Ambulatory Visit: Payer: Self-pay | Admitting: Pharmacy Technician

## 2019-02-21 NOTE — Patient Outreach (Signed)
[facility]) Care Management  02/21/2019  [name] 1965/08/08 [id number]     Incoming call from patient regarding patient assistance application(s) for Basaglar and Trulicity with [employer] , HIPAA identifiers verified.   Patient called to inform he received the application. He inquired if he needed to do anything other than to sign the application and then he inquired if he could bring it to the office or mail it.  Informed patient to sign where indicated and to look over the application for accuracy. Informed patient to mail back application with the envelope that was enclosed with the application. Patient verbalized understanding.  Follow up:  Will route note to embedded [facility] RPh [name] for case closure if document(s) have not been received in the next 15 business days.  Cristian Davitt P. Kazia Grisanti, [place] Management [phone]

## 2019-02-27 ENCOUNTER — Telehealth: Payer: Self-pay

## 2019-02-27 ENCOUNTER — Telehealth: Payer: Self-pay | Admitting: Family Medicine

## 2019-02-27 NOTE — Telephone Encounter (Signed)
[employer]exium approved 01/2019 till 12/2019.

## 2019-02-28 ENCOUNTER — Other Ambulatory Visit: Payer: Self-pay | Admitting: Pharmacy Technician

## 2019-02-28 NOTE — Patient Outreach (Signed)
[facility] [facility]) Care Management  02/28/2019  [name] 21-Apr-1965 [id number]  Received both patient and provider portion(s) of patient assistance application(s) for Trulicity and [employer]. Faxed completed application and required documents into [employer].  Will follow up with company(ies) in 7-10 business days to check status of application(s).  [name] P. [name], [place] Management [phone]

## 2019-03-06 DIAGNOSIS — I129 Hypertensive chronic kidney disease with stage 1 through stage 4 chronic kidney disease, or unspecified chronic kidney disease: Secondary | ICD-10-CM | POA: Diagnosis not present

## 2019-03-06 DIAGNOSIS — E875 Hyperkalemia: Secondary | ICD-10-CM | POA: Diagnosis not present

## 2019-03-06 DIAGNOSIS — R809 Proteinuria, unspecified: Secondary | ICD-10-CM | POA: Diagnosis not present

## 2019-03-06 DIAGNOSIS — E1122 Type 2 diabetes mellitus with diabetic chronic kidney disease: Secondary | ICD-10-CM | POA: Diagnosis not present

## 2019-03-06 DIAGNOSIS — N184 Chronic kidney disease, stage 4 (severe): Secondary | ICD-10-CM | POA: Diagnosis not present

## 2019-03-06 DIAGNOSIS — N2581 Secondary hyperparathyroidism of renal origin: Secondary | ICD-10-CM | POA: Diagnosis not present

## 2019-03-10 ENCOUNTER — Other Ambulatory Visit: Payer: Self-pay | Admitting: Pharmacy Technician

## 2019-03-10 NOTE — Patient Outreach (Signed)
[place] [facility]) Care Management  03/10/2019  [name] LANSING May 11, 1965 [id number]   Care coordination call placed to [place] in regards to patient's application for Trulicity and Basaglar.  Spoke to [place] who informed the application was received but was in the processing phase. Unfortunately she was unable to give me a timeline of when to call back.  Will follow up with [employer] in 5-7 business days.  Miklos Bidinger P. Antron Seth, [place] Management [phone]

## 2019-03-14 ENCOUNTER — Other Ambulatory Visit: Payer: Self-pay | Admitting: Pharmacy Technician

## 2019-03-14 NOTE — Patient Outreach (Signed)
[facility] [facility]) Care Management  03/14/2019  Torreon Musick Par[place] [birth year] [id number]    ADDENDUM   Unsuccessful call placed to patient regarding patient assistance application(s) for Basaglar and Trulicity with [employer] , HIPAA compliant voicemail left.   Was calling to inform patient that he was approved for the program and to provide him the number to [facility] if he is in need of refills.  Will route note to embedded [place] to update patient and provide him with phone number to [facility] which is [phone] if he is in need of refills of the above named medications. Follow up:  Will route note to embedded [place] for case closure  Kezia Benevides P. Brody Kump, [place] Management [phone]

## 2019-03-14 NOTE — Patient Outreach (Signed)
[place] [facility]) Care Management  03/14/2019  [name] [birth year] [id number]  Care coordination call placed to [place] cares in regards to patient's application for [place] and Trulicity.  Spoke to [facility] who informed patient was APPROVED 03/12/2019-01/26/2020 for 4 months supply of each medication. [name] informed to have patient call in to schedule delivery of his medications if they are needed as a request was made to pharmacy but he has no delivery information.  Will outreach patient with this information.  [name], [place] Management [phone]

## 2019-03-17 ENCOUNTER — Telehealth: Payer: Self-pay

## 2019-03-17 NOTE — Telephone Encounter (Signed)
The pt was notified that his Trulicity (4 boxes) and Engineer, agricultural (2 boxes) medication are here and needs to be picked up in the next 48-72 hours. He said he will pick up the medications on Monday.

## 2019-03-23 ENCOUNTER — Ambulatory Visit (INDEPENDENT_AMBULATORY_CARE_PROVIDER_SITE_OTHER): Payer: PPO | Admitting: Pharmacist

## 2019-03-23 DIAGNOSIS — N184 Chronic kidney disease, stage 4 (severe): Secondary | ICD-10-CM

## 2019-03-23 DIAGNOSIS — Z794 Long term (current) use of insulin: Secondary | ICD-10-CM

## 2019-03-23 DIAGNOSIS — E1122 Type 2 diabetes mellitus with diabetic chronic kidney disease: Secondary | ICD-10-CM

## 2019-03-23 DIAGNOSIS — I10 Essential (primary) hypertension: Secondary | ICD-10-CM

## 2019-03-23 NOTE — Chronic Care Management (AMB) (Signed)
Chronic Care Management   Follow Up Note   03/23/2019 Name: [name] MRN: [id number] DOB: 06-01-1965  Referred by: Darren Hauser, DO Reason for referral : Chronic Care Management (Patient Phone Call)   [name] is a 54 y.o. year old male who is a primary care patient of Darren Hauser, DO. The CCM team was consulted for assistance with chronic disease management and care coordination needs.  [name] has a past medical history including but not limited to Type 2 diabetes, hypertension, CKD, chronic systolic heart failure, gout, seasonal allergies and GERD.  I reached out to [name] by phone today.   Review of patient status, including review of consultants reports, relevant laboratory and other test results, and collaboration with appropriate care team members and the patient's provider was performed as part of comprehensive patient evaluation and provision of chronic care management services.     Outpatient Encounter Medications as of 03/23/2019  Medication Sig Note  . calcitRIOL (ROCALTROL) 0.5 MCG capsule Take 1 capsule by mouth daily.   . Dulaglutide (TRULICITY) 1.95 KD/3.2IZ SOPN Inject 0.75 mg into the skin once a week. 07/20/2018: Taking on Fridays  . Insulin Glargine (BASAGLAR KWIKPEN) 100 UNIT/ML SOPN Inject 15 Units into the skin daily. Submitted to [employer] for assistance 10/06/2018: Reports taking 14 units each morning  . albuterol (PROVENTIL) (2.5 MG/3ML) 0.083% nebulizer solution Take 3 mLs (2.5 mg total) by nebulization every 6 (six) hours as needed for wheezing or shortness of breath.   [name] Kitchen albuterol (VENTOLIN HFA) 108 (90 Base) MCG/ACT inhaler Inhale 2 puffs into the lungs every 4 (four) hours as needed for wheezing or shortness of breath. (Patient not taking: Reported on 01/24/2019)   . allopurinol (ZYLOPRIM) 100 MG tablet Take 2 tablets (200 mg total) by mouth daily.   [name] Kitchen aspirin 81 MG chewable tablet Chew 81 mg by mouth  daily.   [name] Kitchen atorvastatin (LIPITOR) 20 MG tablet Take 1 tablet (20 mg total) by mouth daily.   . blood glucose meter kit and supplies KIT Brand of choice; LON 99 months; check FSBS 3x a day; E11.65; disp one meter with 100 strips + 1 refill of strips   . Blood Glucose Monitoring Suppl (ONE TOUCH ULTRA 2) w/Device KIT Use to check blood glucose up to 3 x daily   . bumetanide (BUMEX) 1 MG tablet Take 1 mg by mouth 2 (two) times daily. Per Dr. Juleen China 10/06/2018: Taking 2 mg (2 tablets) each morning and 1 mg (1 tablet) each afternoon (as last prescribed by Cardiology)  . carvedilol (COREG) 25 MG tablet Take 2 tablets (50 mg total) by mouth 2 (two) times daily with a meal.   . CLARITIN 10 MG tablet Take 1 tablet (10 mg total) by mouth daily. One po daily x 5 days   . hydrOXYzine (VISTARIL) 25 MG capsule Take 1 capsule (25 mg total) by mouth every 8 (eight) hours as needed for itching.   . Insulin Pen Needle (B-D UF III MINI PEN NEEDLES) 31G X 5 MM MISC 1 each by Does not apply route daily. Use once daily with Victoza.   . Lancets (ONETOUCH ULTRASOFT) lancets Check blood glucose up to 3 x daily   . lisinopril (ZESTRIL) 2.5 MG tablet Take 1 tablet (2.5 mg total) by mouth daily.   . metolazone (ZAROXOLYN) 5 MG tablet Take 5 mg by mouth daily. as needed (for weight gain, as directed by your provider).   [name] Kitchen NEXIUM 40  MG capsule Take 1 capsule (40 mg total) by mouth daily before breakfast.   . ONE TOUCH ULTRA TEST test strip Check sugar 3 x daily   . simethicone (MYLICON) 324 MG chewable tablet Chew 125 mg by mouth every 6 (six) hours as needed for flatulence.   . traMADol (ULTRAM) 50 MG tablet Take 1-2 tablets (50-100 mg total) by mouth every 12 (twelve) hours as needed.   . vitamin C (ASCORBIC ACID) 500 MG tablet Take 1,000 mg by mouth daily.    No facility-administered encounter medications on file as of 03/23/2019.    Goals Addressed            This Visit's Progress   . PharmD - Medication Management        CARE PLAN ENTRY (see longitudinal plan of care for additional care plan information)  Current Barriers:  . Financial Barriers  . Lack of blood sugar or blood pressure results for the clinical team  Pharmacist Clinical Goal(s):  [name] Kitchen Over the next 30 days, patient will work with CM Pharmacist to address needs related to medication therapy optimization  Interventions: . Received coordination of care message from [facility] CPhT [name] -patient was approved to receive Basaglar and Trulicity from [employer] patient assistance program for 2021 calendar year.  ? Perform chart review. Note patient contacted by office on 2/19 regarding picking up medications from patient assistance program.  ? [name] denies having picked up these medications as planned.  . Again remind patient that medications from [place] patient assistance program will need to be shipped directly to his home, rather than office, if unable to pick up from office in timely manner. ? [name] understanding and states that he will pick up medications today or tomorrow. [name] Kitchen Counsel patient on process for ordering refills through [place] patient assistance program . Counsel on importance of continued blood sugar control and monitoring ? Reports recent CBGs ranging: 120s-140s ? Reports taking:  ? Trulicity 4.01 mg weekly ? Basaglar 14 units once daily. [name] patient to monitor home BP/HR ? [name] denies monitoring at home ? Note CM Pharmacist has repeatedly encouraged patient to check BP as recommended by providers and upper arm BP monitor provided. However, patient declines to monitor BP at home. [name] Kitchen Counsel patient on importance of medication adherence ? Patient reports his Nephrologist increased his dose of calcitriol following lab work from last visit on 2/8. However, denies having yet picked up updated prescription from pharmacy.  ? Confirm via shared record in chart calcitriol 0.5 mcg once daily listed on  patient's latest medication list from Nephrology ? Patient states that he will pick up new prescription from pharmacy to take as directed. . Will collaborate with CCM Nurse Case Manager  Patient Self Care Activities:  . Self administers medications . Calls pharmacy for medication refills . Calls provider office for new concerns or questions  . Patient to check daily blood sugars, including fasting blood sugars, and keep log . Patient to monitor home BP  Please see past updates related to this goal by clicking on the "Past Updates" button in the selected goal         Plan  The care management team will reach out to the patient again in March.  [name], PharmD, [place] [employer] [phone]

## 2019-03-23 NOTE — Patient Instructions (Signed)
Thank you allowing the Chronic Care Management Team to be a part of your care! It was a pleasure speaking with you today!     CCM (Chronic Care Management) Team    Noreene Larsson RN, MSN, CCM Nurse Care Coordinator  [phone]   [name] PharmD  Clinical Pharmacist  [phone]   [name] LCSW Clinical Social Worker [phone]  Visit Information  Goals Addressed            This Visit's Progress   . PharmD - Medication Management       CARE PLAN ENTRY (see longitudinal plan of care for additional care plan information)  Current Barriers:  . Financial Barriers  . Lack of blood sugar or blood pressure results for the clinical team  Pharmacist Clinical Goal(s):  [name] Kitchen Over the next 30 days, patient will work with CM Pharmacist to address needs related to medication therapy optimization  Interventions: . Received coordination of care message from [facility] CPhT [name] -patient was approved to receive Basaglar and Trulicity from [employer] patient assistance program for 2021 calendar year.   . Again remind patient that medications from [place] patient assistance program will need to be shipped directly to his home, rather than office, if unable to pick up from office in timely manner. ? Mr. keagan currier understanding and states that he will pick up medications today or tomorrow. [name] Kitchen Counsel patient on process for ordering refills through [place] patient assistance program . Counsel on importance of continued blood sugar control and monitoring ? Reports recent CBGs ranging: 120s-140s ? Reports taking:  ? Trulicity [id number] mg weekly ? Basaglar 14 units once daily. [name] patient to monitor home BP/HR ? Mr. Tyrie denies monitoring at home ? Note CM Pharmacist has repeatedly encouraged patient to check BP as recommended by providers and upper arm BP monitor provided. However, patient declines to monitor BP at home. [name] Kitchen Counsel patient on importance of medication  adherence ? Patient reports his Nephrologist increased his dose of calcitriol following lab work from last visit on 2/8. However, denies having yet picked up updated prescription from pharmacy.  ? Patient states that he will pick up new prescription from pharmacy to take as directed. . Will collaborate with CCM Nurse Case Manager  Patient Self Care Activities:  . Self administers medications . Calls pharmacy for medication refills . Calls provider office for new concerns or questions  . Patient to check daily blood sugars, including fasting blood sugars, and keep log . Patient to monitor home BP  Please see past updates related to this goal by clicking on the "Past Updates" button in the selected goal         Patient verbalizes understanding of instructions provided today.   The care management team will reach out to the patient again in March  [name], PharmD, [place] [phone]

## 2019-03-27 ENCOUNTER — Telehealth: Payer: Self-pay

## 2019-04-13 ENCOUNTER — Telehealth: Payer: Self-pay

## 2019-04-13 ENCOUNTER — Ambulatory Visit: Payer: Self-pay | Admitting: General Practice

## 2019-04-13 NOTE — Chronic Care Management (AMB) (Signed)
  Chronic Care Management   Outreach Note  04/13/2019 Name: [name] MRN: [id number] DOB: [birth year]  Referred by: [name], DO Reason for referral : Chronic Care Management (Chronic disease management and care coordination needs-attempt)   An unsuccessful telephone outreach was attempted today. The patient was referred to the case management team for assistance with care management and care coordination.   Follow Up Plan: A HIPPA compliant phone message was left for the patient providing contact information and requesting a return call.    Noreene Larsson RN, MSN, [place] Mobile: [phone]

## 2019-04-14 ENCOUNTER — Telehealth: Payer: Self-pay | Admitting: Family Medicine

## 2019-04-14 DIAGNOSIS — K219 Gastro-esophageal reflux disease without esophagitis: Secondary | ICD-10-CM

## 2019-04-14 NOTE — Telephone Encounter (Signed)
As per patient esomeprazole has not improved his Sxs for Acid reflux he had bought some Nexium form OTC from [place], he needs medication with 100 mg please suggest ?

## 2019-04-14 NOTE — Telephone Encounter (Signed)
For nexium, we can increase to 40mg  TWICE a day if he prefers.  But there is no higher dose for this type of acid blocker medication.  Let me know if he wants 40mg  twice a day new rx.  [name], [facility] 04/14/2019, 5:12 PM

## 2019-04-17 MED ORDER — NEXIUM 40 MG PO CPDR: 40.0000 mg | DELAYED_RELEASE_CAPSULE | Freq: Two times a day (BID) | ORAL | 1 refills | Status: DC

## 2019-04-17 NOTE — Telephone Encounter (Signed)
Sent new rx Nexium 40 BID, brand name, to [place] 04/17/2019, 10:12 AM

## 2019-04-17 NOTE — Telephone Encounter (Signed)
Patient wants Rx for Nexium 40 mg twice daily.

## 2019-04-17 NOTE — Telephone Encounter (Signed)
Left message

## 2019-04-21 ENCOUNTER — Other Ambulatory Visit: Payer: Self-pay | Admitting: Family Medicine

## 2019-04-21 DIAGNOSIS — G894 Chronic pain syndrome: Secondary | ICD-10-CM

## 2019-04-21 DIAGNOSIS — M159 Polyosteoarthritis, unspecified: Secondary | ICD-10-CM

## 2019-04-21 MED ORDER — TRAMADOL HCL 50 MG PO TABS: 50.0000 mg | ORAL_TABLET | Freq: Two times a day (BID) | ORAL | 2 refills | Status: DC | PRN

## 2019-04-24 ENCOUNTER — Telehealth: Payer: Self-pay

## 2019-04-26 ENCOUNTER — Ambulatory Visit (INDEPENDENT_AMBULATORY_CARE_PROVIDER_SITE_OTHER): Payer: PPO | Admitting: Pharmacist

## 2019-04-26 DIAGNOSIS — Z794 Long term (current) use of insulin: Secondary | ICD-10-CM

## 2019-04-26 DIAGNOSIS — K219 Gastro-esophageal reflux disease without esophagitis: Secondary | ICD-10-CM

## 2019-04-26 DIAGNOSIS — E1122 Type 2 diabetes mellitus with diabetic chronic kidney disease: Secondary | ICD-10-CM | POA: Diagnosis not present

## 2019-04-26 DIAGNOSIS — I10 Essential (primary) hypertension: Secondary | ICD-10-CM | POA: Diagnosis not present

## 2019-04-26 DIAGNOSIS — N184 Chronic kidney disease, stage 4 (severe): Secondary | ICD-10-CM

## 2019-04-26 NOTE — Chronic Care Management (AMB) (Signed)
Chronic Care Management   Follow Up Note   04/26/2019 Name: Chalmers W Regnier MRN: [id number] DOB: 09/03/1965  Referred by: [name], Alexander J, DO Reason for referral : Chronic Care Management (Patient Phone Call)   An W Truax is a 54 y.o. year old male who is a primary care patient of [name], Alexander J, DO. The CCM team was consulted for assistance with chronic disease management and care coordination needs.  Mr. Handler has a past medical history including but not limited to Type 2 diabetes, hypertension, CKD, chronic systolic heart failure, gout, seasonal allergies and GERD.  I reached out to Pete W Kudo by phone today.   Review of patient status, including review of consultants reports, relevant laboratory and other test results, and collaboration with appropriate care team members and the patient's provider was performed as part of comprehensive patient evaluation and provision of chronic care management services.     Outpatient Encounter Medications as of 04/26/2019  Medication Sig Note  . CLARITIN 10 MG tablet Take 1 tablet (10 mg total) by mouth daily. One po daily x 5 days   . Dulaglutide (TRULICITY) 0.75 MG/0.5ML SOPN Inject 0.75 mg into the skin once a week. 07/20/2018: Taking on Fridays  . Insulin Glargine (BASAGLAR KWIKPEN) 100 UNIT/ML SOPN Inject 15 Units into the skin daily. Submitted to [facility] for assistance 10/06/2018: Reports taking 14 units each morning  . NEXIUM 40 MG capsule Take 1 capsule (40 mg total) by mouth 2 (two) times daily before a meal.   . albuterol (PROVENTIL) (2.5 MG/3ML) 0.083% nebulizer solution Take 3 mLs (2.5 mg total) by nebulization every 6 (six) hours as needed for wheezing or shortness of breath.   . albuterol (VENTOLIN HFA) 108 (90 Base) MCG/ACT inhaler Inhale 2 puffs into the lungs every 4 (four) hours as needed for wheezing or shortness of breath. (Patient not taking: Reported on 01/24/2019)   . allopurinol (ZYLOPRIM)  100 MG tablet Take 2 tablets (200 mg total) by mouth daily.   . aspirin 81 MG chewable tablet Chew 81 mg by mouth daily.   . atorvastatin (LIPITOR) 20 MG tablet Take 1 tablet (20 mg total) by mouth daily.   . blood glucose meter kit and supplies KIT Brand of choice; LON 99 months; check FSBS 3x a day; E11.65; disp one meter with 100 strips + 1 refill of strips   . Blood Glucose Monitoring Suppl (ONE TOUCH ULTRA 2) w/Device KIT Use to check blood glucose up to 3 x daily   . bumetanide (BUMEX) 1 MG tablet Take 1 mg by mouth 2 (two) times daily. Per Dr. Kolluru 10/06/2018: Taking 2 mg (2 tablets) each morning and 1 mg (1 tablet) each afternoon (as last prescribed by Cardiology)  . carvedilol (COREG) 25 MG tablet Take 2 tablets (50 mg total) by mouth 2 (two) times daily with a meal.   . hydrOXYzine (VISTARIL) 25 MG capsule Take 1 capsule (25 mg total) by mouth every 8 (eight) hours as needed for itching.   . Insulin Pen Needle (B-D UF III MINI PEN NEEDLES) 31G X 5 MM MISC 1 each by Does not apply route daily. Use once daily with Victoza.   . Lancets (ONETOUCH ULTRASOFT) lancets Check blood glucose up to 3 x daily   . lisinopril (ZESTRIL) 2.5 MG tablet Take 1 tablet (2.5 mg total) by mouth daily.   . metolazone (ZAROXOLYN) 5 MG tablet Take 5 mg by mouth daily. as needed (for weight gain, as directed   by your provider).   . ONE TOUCH ULTRA TEST test strip Check sugar 3 x daily   . simethicone (MYLICON) 161 MG chewable tablet Chew 125 mg by mouth every 6 (six) hours as needed for flatulence.   . traMADol (ULTRAM) 50 MG tablet Take 1-2 tablets (50-100 mg total) by mouth every 12 (twelve) hours as needed.   . vitamin C (ASCORBIC ACID) 500 MG tablet Take 1,000 mg by mouth daily.    No facility-administered encounter medications on file as of 04/26/2019.    Goals Addressed            This Visit's Progress   . PharmD - Medication Management       CARE PLAN ENTRY (see longitudinal plan of care for  additional care plan information)  Current Barriers:  . Financial Barriers  . Lack of blood sugar or blood pressure results for the clinical team  Pharmacist Clinical Goal(s):  [name] Kitchen Over the next 30 days, patient will work with CM Pharmacist to address needs related to medication therapy optimization  Interventions: . Counsel on importance of continued blood sugar control and monitoring ? Reports recent CBGs ranging: 80s-120s ? Reports taking:  ? Trulicity 0.96 mg weekly ? Basaglar 14 units once daily. ? Denies any s/s low blood sugar ? Advise patient to follow instruction from PCP (from 6/29 note) "if CBG running <150 he can gradually decrease Basaglar down by 2 units every week as tolerated" . F/u with patient regarding acid control ? Reports improvement in acid control with increase in Nexium dose to 40 mg twice daily ? Counsel patient on strategies for managing acid reflux symptoms ? Offer to send patient written education, but patient declines . Enourage patient to monitor home BP/HR ? Mr. Polak denies monitoring at home ? Note CM Pharmacist has repeatedly encouraged patient to check BP as recommended by providers and upper arm BP monitor provided. However, patient declines to monitor BP at home.  Patient Self Care Activities:  . Self administers medications . Calls pharmacy for medication refills . Calls provider office for new concerns or questions  . Patient to check daily blood sugars, including fasting blood sugars, and keep log . Patient UNABLE to monitor home BP  Please see past updates related to this goal by clicking on the "Past Updates" button in the selected goal         Plan  The care management team will reach out to the patient again over the next 60 days.   [name], PharmD, [place] [employer] [phone]

## 2019-04-26 NOTE — Patient Instructions (Signed)
Thank you allowing the Chronic Care Management Team to be a part of your care! It was a pleasure speaking with you today!     CCM (Chronic Care Management) Team    Noreene Larsson RN, MSN, CCM Nurse Care Coordinator  [phone]   [name] PharmD  Clinical Pharmacist  [phone]   [name] LCSW Clinical Social Worker [phone]  Visit Information  Goals Addressed            This Visit's Progress   . PharmD - Medication Management       CARE PLAN ENTRY (see longitudinal plan of care for additional care plan information)  Current Barriers:  . Financial Barriers  . Lack of blood sugar or blood pressure results for the clinical team  Pharmacist Clinical Goal(s):  [name] Kitchen Over the next 30 days, patient will work with CM Pharmacist to address needs related to medication therapy optimization  Interventions: . Counsel on importance of continued blood sugar control and monitoring ? Reports recent CBGs ranging: 80s-120s ? Reports taking:  ? Trulicity 7.04 mg weekly ? Basaglar 14 units once daily. ? Denies any s/s low blood sugar ? Advise patient to follow instruction from PCP (from 6/29 note) "if CBG running <150 he can gradually decrease Basaglar down by 2 units every week as tolerated" . F/u with patient regarding acid control ? Reports improvement in acid control with increase in Nexium dose to 40 mg twice daily ? Counsel patient on strategies for managing acid reflux symptoms ? Offer to send patient written education, but patient declines . Enourage patient to monitor home BP/HR ? Mr. Tuckerman denies monitoring at home ? Note CM Pharmacist has repeatedly encouraged patient to check BP as recommended by providers and upper arm BP monitor provided. However, patient declines to monitor BP at home.  Patient Self Care Activities:  . Self administers medications . Calls pharmacy for medication refills . Calls provider office for new concerns or questions  . Patient  to check daily blood sugars, including fasting blood sugars, and keep log . Patient UNABLE to monitor home BP  Please see past updates related to this goal by clicking on the "Past Updates" button in the selected goal         Patient verbalizes understanding of instructions provided today.   The care management team will reach out to the patient again over the next 60 days.   [name], PharmD, [place] [employer] [phone]

## 2019-05-18 ENCOUNTER — Ambulatory Visit (INDEPENDENT_AMBULATORY_CARE_PROVIDER_SITE_OTHER): Payer: PPO | Admitting: General Practice

## 2019-05-18 ENCOUNTER — Telehealth: Payer: Self-pay | Admitting: General Practice

## 2019-05-18 DIAGNOSIS — E1169 Type 2 diabetes mellitus with other specified complication: Secondary | ICD-10-CM

## 2019-05-18 DIAGNOSIS — E1122 Type 2 diabetes mellitus with diabetic chronic kidney disease: Secondary | ICD-10-CM | POA: Diagnosis not present

## 2019-05-18 DIAGNOSIS — Z794 Long term (current) use of insulin: Secondary | ICD-10-CM

## 2019-05-18 DIAGNOSIS — I5022 Chronic systolic (congestive) heart failure: Secondary | ICD-10-CM

## 2019-05-18 DIAGNOSIS — E785 Hyperlipidemia, unspecified: Secondary | ICD-10-CM | POA: Diagnosis not present

## 2019-05-18 DIAGNOSIS — N184 Chronic kidney disease, stage 4 (severe): Secondary | ICD-10-CM | POA: Diagnosis not present

## 2019-05-18 DIAGNOSIS — I129 Hypertensive chronic kidney disease with stage 1 through stage 4 chronic kidney disease, or unspecified chronic kidney disease: Secondary | ICD-10-CM

## 2019-05-18 NOTE — Patient Instructions (Signed)
Visit Information  Goals Addressed            This Visit's Progress   . COMPLETED: I am trying to stay healthy (pt-stated)       Current Barriers:  . Chronic Disease Management support and education needs related to managing multiple chronic disease processes  Nurse Case Manager Clinical Goal(s):  [name] Kitchen Over the next 90 days, patient will work with [facility]  to address needs related to managing multiple chronic disease processes ie  DM2, COPD, HF.   Interventions:  . Advised patient to think about weight loss options . Collaborated with Grayland Ormond Pharm-D regarding patient's current medication regimen . Advised patient, providing education and rationale, to weigh daily and record, calling Cardiologist for weight gain of 3lbs overnight or 5 pounds in a week.  . Reviewed sodium and fluid restrictions set by cardiology.   Patient Self Care Activities:  . Currently UNABLE TO independently and effectively manage multiple chronic disease pocesses   Initial goal documentation     . RNCM: "I don't take my blood pressure it is always good" (pt-stated)       CARE PLAN ENTRY (see longtitudinal plan of care for additional care plan information)  Current Barriers:  . Chronic Disease Management support, education, and care coordination needs related to CHF, HTN, HLD, DMII, and CKD Stage 4  Clinical Goal(s) related to CHF, HTN, HLD, DMII, and CKD Stage 4 :  Over the next 120 days, patient will:  . Work with the care management team to address educational, disease management, and care coordination needs  . Begin or continue self health monitoring activities as directed today Measure and record cbg (blood glucose) 1 times daily, Measure and record blood pressure 1 times per week, Measure and record weight daily, and adhere to a heart healthy ADA diet . Call provider office for new or worsened signs and symptoms Blood glucose findings outside established parameters, Blood pressure findings outside  established parameters, Weight outside established parameters, Chest pain, Shortness of breath, and New or worsened symptom related to chronic health conditions . Call care management team with questions or concerns . Verbalize basic understanding of patient centered plan of care established today  Interventions related to CHF, HTN, HLD, DMII, and CKD Stage 4 :  . Evaluation of current treatment plans and patient's adherence to plan as established by provider . Assessed patient understanding of disease states.  The patient verbalized understanding of his chronic conditions but does not check his blood pressures at home and takes his blood sugars every other day.  Last reported blood sugars 107 and 121. Education on checking blood sugars daily and checking his blood pressure at least one time a week and record bringing record to the MD visits with him.   . Evaluation of daily weights. The patient states his current weight is 335.  Endorses weighing daily.  The patient states that sometimes he can have a 10 pound shift in his weight. Education on calling provider for weight changes +/- 3 pounds in one day or +/- 5 pounds in one week. The patient verbalized his weight fluctuates based on how much water he drinks.  . Assessed patient's education and care coordination needs.  The patient verbalized he would like to know more about healthy food options and getting fresh produce like avocados and tomatoes.  Will do a care guide referral for resources in [facility] that would help the patient get fresh foods. The patient also expressed the need for  help with utilities and housing expenses.  . Provided disease specific education to patient: Information/education on heart healthy and diabetes by [employer] functionality.  Nash Dimmer with appropriate clinical care team members regarding patient needs.  The patient is involved with Pharm D and LCSW on the team.   Patient Self Care Activities related to CHF,  HTN, HLD, DMII, and CKD Stage 4 :  . Patient is unable to independently self-manage chronic health conditions  Initial goal documentation        Patient verbalizes understanding of instructions provided today.   The care management team will reach out to the patient again over the next 60 days.   Noreene Larsson RN, MSN, [facility] Mobile: [phone]    DASH Eating Plan DASH stands for "Dietary Approaches to Stop Hypertension." The DASH eating plan is a healthy eating plan that has been shown to reduce high blood pressure (hypertension). It may also reduce your risk for type 2 diabetes, heart disease, and stroke. The DASH eating plan may also help with weight loss. What are tips for following this plan?  General guidelines  Avoid eating more than 2,300 mg (milligrams) of salt (sodium) a day. If you have hypertension, you may need to reduce your sodium intake to 1,500 mg a day.  Limit alcohol intake to no more than 1 drink a day for nonpregnant women and 2 drinks a day for men. One drink equals 12 oz of beer, 5 oz of wine, or 1 oz of hard liquor.  Work with your health care provider to maintain a healthy body weight or to lose weight. Ask what an ideal weight is for you.  Get at least 30 minutes of exercise that causes your heart to beat faster (aerobic exercise) most days of the week. Activities may include walking, swimming, or biking.  Work with your health care provider or diet and nutrition specialist (dietitian) to adjust your eating plan to your individual calorie needs. Reading food labels   Check food labels for the amount of sodium per serving. Choose foods with less than 5 percent of the Daily Value of sodium. Generally, foods with less than 300 mg of sodium per serving fit into this eating plan.  To find whole grains, look for the word "whole" as the first word in the ingredient  list. Shopping  Buy products labeled as "low-sodium" or "no salt added."  Buy fresh foods. Avoid canned foods and premade or frozen meals. Cooking  Avoid adding salt when cooking. Use salt-free seasonings or herbs instead of table salt or sea salt. Check with your health care provider or pharmacist before using salt substitutes.  Do not fry foods. Cook foods using healthy methods such as baking, boiling, grilling, and broiling instead.  Cook with heart-healthy oils, such as olive, canola, soybean, or sunflower oil. Meal planning  Eat a balanced diet that includes: ? 5 or more servings of fruits and vegetables each day. At each meal, try to fill half of your plate with fruits and vegetables. ? Up to 6-8 servings of whole grains each day. ? Less than 6 oz of lean meat, poultry, or fish each day. A 3-oz serving of meat is about the same size as a deck of cards. One egg equals 1 oz. ? 2 servings of low-fat dairy each day. ? A serving of nuts, seeds, or beans 5 times each week. ? Heart-healthy fats. Healthy fats called Omega-3  fatty acids are found in foods such as flaxseeds and coldwater fish, like sardines, salmon, and mackerel.  Limit how much you eat of the following: ? Canned or prepackaged foods. ? Food that is high in trans fat, such as fried foods. ? Food that is high in saturated fat, such as fatty meat. ? Sweets, desserts, sugary drinks, and other foods with added sugar. ? Full-fat dairy products.  Do not salt foods before eating.  Try to eat at least 2 vegetarian meals each week.  Eat more home-cooked food and less restaurant, buffet, and fast food.  When eating at a restaurant, ask that your food be prepared with less salt or no salt, if possible. What foods are recommended? The items listed may not be a complete list. Talk with your dietitian about what dietary choices are best for you. Grains Whole-grain or whole-wheat bread. Whole-grain or whole-wheat pasta. Brown  rice. [place] Morrow. Bulgur. Whole-grain and low-sodium cereals. Pita bread. Low-fat, low-sodium crackers. Whole-wheat flour tortillas. Vegetables Fresh or frozen vegetables (raw, steamed, roasted, or grilled). Low-sodium or reduced-sodium tomato and vegetable juice. Low-sodium or reduced-sodium tomato sauce and tomato paste. Low-sodium or reduced-sodium canned vegetables. Fruits All fresh, dried, or frozen fruit. Canned fruit in natural juice (without added sugar). Meat and other protein foods Skinless chicken or [place]. Ground chicken or [place]. Pork with fat trimmed off. Fish and seafood. Egg whites. Dried beans, peas, or lentils. Unsalted nuts, nut butters, and seeds. Unsalted canned beans. Lean cuts of beef with fat trimmed off. Low-sodium, lean deli meat. Dairy Low-fat (1%) or fat-free (skim) milk. Fat-free, low-fat, or reduced-fat cheeses. Nonfat, low-sodium ricotta or cottage cheese. Low-fat or nonfat yogurt. Low-fat, low-sodium cheese. Fats and oils Soft margarine without trans fats. Vegetable oil. Low-fat, reduced-fat, or light mayonnaise and salad dressings (reduced-sodium). Canola, safflower, olive, soybean, and sunflower oils. Avocado. Seasoning and other foods Herbs. Spices. Seasoning mixes without salt. Unsalted popcorn and pretzels. Fat-free sweets. What foods are not recommended? The items listed may not be a complete list. Talk with your dietitian about what dietary choices are best for you. Grains Baked goods made with fat, such as croissants, muffins, or some breads. Dry pasta or rice meal packs. Vegetables Creamed or fried vegetables. Vegetables in a cheese sauce. Regular canned vegetables (not low-sodium or reduced-sodium). Regular canned tomato sauce and paste (not low-sodium or reduced-sodium). Regular tomato and vegetable juice (not low-sodium or reduced-sodium). Angie Fava. Olives. Fruits Canned fruit in a light or heavy syrup. Fried fruit. Fruit in cream or butter  sauce. Meat and other protein foods Fatty cuts of meat. Ribs. Fried meat. Berniece Salines. Sausage. Bologna and other processed lunch meats. Salami. Fatback. Hotdogs. Bratwurst. Salted nuts and seeds. Canned beans with added salt. Canned or smoked fish. Whole eggs or egg yolks. Chicken or [place] with skin. Dairy Whole or 2% milk, cream, and half-and-half. Whole or full-fat cream cheese. Whole-fat or sweetened yogurt. Full-fat cheese. Nondairy creamers. Whipped toppings. Processed cheese and cheese spreads. Fats and oils Butter. Stick margarine. Lard. Shortening. Ghee. Bacon fat. Tropical oils, such as coconut, palm kernel, or palm oil. Seasoning and other foods Salted popcorn and pretzels. Onion salt, garlic salt, seasoned salt, table salt, and sea salt. Worcestershire sauce. Tartar sauce. Barbecue sauce. Teriyaki sauce. Soy sauce, including reduced-sodium. Steak sauce. Canned and packaged gravies. Fish sauce. Oyster sauce. Cocktail sauce. Horseradish that you find on the shelf. Ketchup. Mustard. Meat flavorings and tenderizers. Bouillon cubes. Hot sauce and Tabasco sauce. Premade or packaged marinades. Premade or  packaged taco seasonings. Relishes. Regular salad dressings. Where to find more information:  National Heart, Lung, and [place]: https://wilson-eaton.com/  American Heart Association: www.heart.org Summary  The DASH eating plan is a healthy eating plan that has been shown to reduce high blood pressure (hypertension). It may also reduce your risk for type 2 diabetes, heart disease, and stroke.  With the DASH eating plan, you should limit salt (sodium) intake to 2,300 mg a day. If you have hypertension, you may need to reduce your sodium intake to 1,500 mg a day.  When on the DASH eating plan, aim to eat more fresh fruits and vegetables, whole grains, lean proteins, low-fat dairy, and heart-healthy fats.  Work with your health care provider or diet and nutrition specialist (dietitian) to adjust  your eating plan to your individual calorie needs. This information is not intended to replace advice given to you by your health care provider. Make sure you discuss any questions you have with your health care provider. Document Revised: 12/25/2016 Document Reviewed: 01/06/2016 Elsevier Patient Education  2020 [place].    Blood Glucose Monitoring, Adult Monitoring your blood sugar (glucose) is an important part of managing your diabetes (diabetes mellitus). Blood glucose monitoring involves checking your blood glucose as often as directed and keeping a record (log) of your results over time. Checking your blood glucose regularly and keeping a blood glucose log can:  Help you and your health care provider adjust your diabetes management plan as needed, including your medicines or insulin.  Help you understand how food, exercise, illnesses, and medicines affect your blood glucose.  Let you know what your blood glucose is at any time. You can quickly find out if you have low blood glucose (hypoglycemia) or high blood glucose (hyperglycemia). Your health care provider will set individualized treatment goals for you. Your goals will be based on your age, other medical conditions you have, and how you respond to diabetes treatment. Generally, the goal of treatment is to maintain the following blood glucose levels:  Before meals (preprandial): 80-130 mg/dL (4.4-7.2 mmol/L).  After meals (postprandial): below 180 mg/dL (10 mmol/L).  A1c level: less than 7%. Supplies needed:  Blood glucose meter.  Test strips for your meter. Each meter has its own strips. You must use the strips that came with your meter.  A needle to prick your finger (lancet). Do not use a lancet more than one time.  A device that holds the lancet (lancing device).  A journal or log book to write down your results. How to check your blood glucose  1. Wash your hands with soap and water. 2. Prick the side of your  finger (not the tip) with the lancet. Use a different finger each time. 3. Gently rub the finger until a small drop of blood appears. 4. Follow instructions that come with your meter for inserting the test strip, applying blood to the strip, and using your blood glucose meter. 5. Write down your result and any notes. Some meters allow you to use areas of your body other than your finger (alternative sites) to test your blood. The most common alternative sites are:  Forearm.  Thigh.  Palm of the hand. If you think you may have hypoglycemia, or if you have a history of not knowing when your blood glucose is getting low (hypoglycemia unawareness), do not use alternative sites. Use your finger instead. Alternative sites may not be as accurate as the fingers, because blood flow is slower in these  areas. This means that the result you get may be delayed, and it may be different from the result that you would get from your finger. Follow these instructions at home: Blood glucose log   Every time you check your blood glucose, write down your result. Also write down any notes about things that may be affecting your blood glucose, such as your diet and exercise for the day. This information can help you and your health care provider: ? Look for patterns in your blood glucose over time. ? Adjust your diabetes management plan as needed.  Check if your meter allows you to download your records to a computer. Most glucose meters store a record of glucose readings in the meter. If you have type 1 diabetes:  Check your blood glucose 2 or more times a day.  Also check your blood glucose: ? Before every insulin injection. ? Before and after exercise. ? Before meals. ? 2 hours after a meal. ? Occasionally between 2:00 a.m. and 3:00 a.m., as directed. ? Before potentially dangerous tasks, like driving or using heavy machinery. ? At bedtime.  You may need to check your blood glucose more often, up to  6-10 times a day, if you: ? Use an insulin pump. ? Need multiple daily injections (MDI). ? Have diabetes that is not well-controlled. ? Are ill. ? Have a history of severe hypoglycemia. ? Have hypoglycemia unawareness. If you have type 2 diabetes:  If you take insulin or other diabetes medicines, check your blood glucose 2 or more times a day.  If you are on intensive insulin therapy, check your blood glucose 4 or more times a day. Occasionally, you may also need to check between 2:00 a.m. and 3:00 a.m., as directed.  Also check your blood glucose: ? Before and after exercise. ? Before potentially dangerous tasks, like driving or using heavy machinery.  You may need to check your blood glucose more often if: ? Your medicine is being adjusted. ? Your diabetes is not well-controlled. ? You are ill. General tips  Always keep your supplies with you.  If you have questions or need help, all blood glucose meters have a 24-hour "hotline" phone number that you can call. You may also contact your health care provider.  After you use a few boxes of test strips, adjust (calibrate) your blood glucose meter by following instructions that came with your meter. Contact a health care provider if:  Your blood glucose is at or above 240 mg/dL (13.3 mmol/L) for 2 days in a row.  You have been sick or have had a fever for 2 days or longer, and you are not getting better.  You have any of the following problems for more than 6 hours: ? You cannot eat or drink. ? You have nausea or vomiting. ? You have diarrhea. Get help right away if:  Your blood glucose is lower than 54 mg/dL (3 mmol/L).  You become confused or you have trouble thinking clearly.  You have difficulty breathing.  You have moderate or large ketone levels in your urine. Summary  Monitoring your blood sugar (glucose) is an important part of managing your diabetes (diabetes mellitus).  Blood glucose monitoring involves  checking your blood glucose as often as directed and keeping a record (log) of your results over time.  Your health care provider will set individualized treatment goals for you. Your goals will be based on your age, other medical conditions you have, and how you respond to  diabetes treatment.  Every time you check your blood glucose, write down your result. Also write down any notes about things that may be affecting your blood glucose, such as your diet and exercise for the day. This information is not intended to replace advice given to you by your health care provider. Make sure you discuss any questions you have with your health care provider. Document Revised: 11/05/2017 Document Reviewed: 06/24/2015 Elsevier Patient Education  [place].  Blood Pressure Record Sheet To take your blood pressure, you will need a blood pressure machine. You can buy a blood pressure machine (blood pressure monitor) at your clinic, drug store, or online. When choosing one, consider:  An automatic monitor that has an arm cuff.  A cuff that wraps snugly around your upper arm. You should be able to fit only one finger between your arm and the cuff.  A device that stores blood pressure reading results.  Do not choose a monitor that measures your blood pressure from your wrist or finger. Follow your health care provider's instructions for how to take your blood pressure. To use this form:  Get one reading in the morning (a.m.) before you take any medicines.  Get one reading in the evening (p.m.) before supper.  Take at least 2 readings with each blood pressure check. This makes sure the results are correct. Wait 1-2 minutes between measurements.  Write down the results in the spaces on this form.  Repeat this once a week, or as told by your health care provider.  Make a follow-up appointment with your health care provider to discuss the results. Blood pressure log Date:  _______________________  a.m. _____________________(1st reading) _____________________(2nd reading)  p.m. _____________________(1st reading) _____________________(2nd reading) Date: _______________________  a.m. _____________________(1st reading) _____________________(2nd reading)  p.m. _____________________(1st reading) _____________________(2nd reading) Date: _______________________  a.m. _____________________(1st reading) _____________________(2nd reading)  p.m. _____________________(1st reading) _____________________(2nd reading) Date: _______________________  a.m. _____________________(1st reading) _____________________(2nd reading)  p.m. _____________________(1st reading) _____________________(2nd reading) Date: _______________________  a.m. _____________________(1st reading) _____________________(2nd reading)  p.m. _____________________(1st reading) _____________________(2nd reading) This information is not intended to replace advice given to you by your health care provider. Make sure you discuss any questions you have with your health care provider. Document Revised: 03/12/2017 Document Reviewed: 01/12/2017 Elsevier Patient Education  [place] DASH stands for "Dietary Approaches to Stop Hypertension." The DASH eating plan is a healthy eating plan that has been shown to reduce high blood pressure (hypertension). It may also reduce your risk for type 2 diabetes, heart disease, and stroke. The DASH eating plan may also help with weight loss. What are tips for following this plan?  General guidelines  Avoid eating more than 2,300 mg (milligrams) of salt (sodium) a day. If you have hypertension, you may need to reduce your sodium intake to 1,500 mg a day.  Limit alcohol intake to no more than 1 drink a day for nonpregnant women and 2 drinks a day for men. One drink equals 12 oz of beer, 5 oz of wine, or 1 oz of hard liquor.  Work with your  health care provider to maintain a healthy body weight or to lose weight. Ask what an ideal weight is for you.  Get at least 30 minutes of exercise that causes your heart to beat faster (aerobic exercise) most days of the week. Activities may include walking, swimming, or biking.  Work with your health care provider or diet and nutrition specialist (dietitian) to adjust your eating  plan to your individual calorie needs. Reading food labels   Check food labels for the amount of sodium per serving. Choose foods with less than 5 percent of the Daily Value of sodium. Generally, foods with less than 300 mg of sodium per serving fit into this eating plan.  To find whole grains, look for the word "whole" as the first word in the ingredient list. Shopping  Buy products labeled as "low-sodium" or "no salt added."  Buy fresh foods. Avoid canned foods and premade or frozen meals. Cooking  Avoid adding salt when cooking. Use salt-free seasonings or herbs instead of table salt or sea salt. Check with your health care provider or pharmacist before using salt substitutes.  Do not fry foods. Cook foods using healthy methods such as baking, boiling, grilling, and broiling instead.  Cook with heart-healthy oils, such as olive, canola, soybean, or sunflower oil. Meal planning  Eat a balanced diet that includes: ? 5 or more servings of fruits and vegetables each day. At each meal, try to fill half of your plate with fruits and vegetables. ? Up to 6-8 servings of whole grains each day. ? Less than 6 oz of lean meat, poultry, or fish each day. A 3-oz serving of meat is about the same size as a deck of cards. One egg equals 1 oz. ? 2 servings of low-fat dairy each day. ? A serving of nuts, seeds, or beans 5 times each week. ? Heart-healthy fats. Healthy fats called Omega-3 fatty acids are found in foods such as flaxseeds and coldwater fish, like sardines, salmon, and mackerel.  Limit how much you eat of  the following: ? Canned or prepackaged foods. ? Food that is high in trans fat, such as fried foods. ? Food that is high in saturated fat, such as fatty meat. ? Sweets, desserts, sugary drinks, and other foods with added sugar. ? Full-fat dairy products.  Do not salt foods before eating.  Try to eat at least 2 vegetarian meals each week.  Eat more home-cooked food and less restaurant, buffet, and fast food.  When eating at a restaurant, ask that your food be prepared with less salt or no salt, if possible. What foods are recommended? The items listed may not be a complete list. Talk with your dietitian about what dietary choices are best for you. Grains Whole-grain or whole-wheat bread. Whole-grain or whole-wheat pasta. Brown rice. [place]row. Bulgur. Whole-grain and low-sodium cereals. Pita bread. Low-fat, low-sodium crackers. Whole-wheat flour tortillas. Vegetables Fresh or frozen vegetables (raw, steamed, roasted, or grilled). Low-sodium or reduced-sodium tomato and vegetable juice. Low-sodium or reduced-sodium tomato sauce and tomato paste. Low-sodium or reduced-sodium canned vegetables. Fruits All fresh, dried, or frozen fruit. Canned fruit in natural juice (without added sugar). Meat and other protein foods Skinless chicken or [place]. Ground chicken or [place]. Pork with fat trimmed off. Fish and seafood. Egg whites. Dried beans, peas, or lentils. Unsalted nuts, nut butters, and seeds. Unsalted canned beans. Lean cuts of beef with fat trimmed off. Low-sodium, lean deli meat. Dairy Low-fat (1%) or fat-free (skim) milk. Fat-free, low-fat, or reduced-fat cheeses. Nonfat, low-sodium ricotta or cottage cheese. Low-fat or nonfat yogurt. Low-fat, low-sodium cheese. Fats and oils Soft margarine without trans fats. Vegetable oil. Low-fat, reduced-fat, or light mayonnaise and salad dressings (reduced-sodium). Canola, safflower, olive, soybean, and sunflower oils. Avocado. Seasoning and  other foods Herbs. Spices. Seasoning mixes without salt. Unsalted popcorn and pretzels. Fat-free sweets. What foods are not recommended? The items listed  may not be a complete list. Talk with your dietitian about what dietary choices are best for you. Grains Baked goods made with fat, such as croissants, muffins, or some breads. Dry pasta or rice meal packs. Vegetables Creamed or fried vegetables. Vegetables in a cheese sauce. Regular canned vegetables (not low-sodium or reduced-sodium). Regular canned tomato sauce and paste (not low-sodium or reduced-sodium). Regular tomato and vegetable juice (not low-sodium or reduced-sodium). Angie Fava. Olives. Fruits Canned fruit in a light or heavy syrup. Fried fruit. Fruit in cream or butter sauce. Meat and other protein foods Fatty cuts of meat. Ribs. Fried meat. [place] Salines. Sausage. Bologna and other processed lunch meats. Salami. Fatback. Hotdogs. Bratwurst. Salted nuts and seeds. Canned beans with added salt. Canned or smoked fish. Whole eggs or egg yolks. Chicken or [place] with skin. Dairy Whole or 2% milk, cream, and half-and-half. Whole or full-fat cream cheese. Whole-fat or sweetened yogurt. Full-fat cheese. Nondairy creamers. Whipped toppings. Processed cheese and cheese spreads. Fats and oils Butter. Stick margarine. Lard. Shortening. Ghee. Bacon fat. Tropical oils, such as coconut, palm kernel, or palm oil. Seasoning and other foods Salted popcorn and pretzels. Onion salt, garlic salt, seasoned salt, table salt, and sea salt. Worcestershire sauce. Tartar sauce. Barbecue sauce. Teriyaki sauce. Soy sauce, including reduced-sodium. Steak sauce. Canned and packaged gravies. Fish sauce. Oyster sauce. Cocktail sauce. Horseradish that you find on the shelf. Ketchup. Mustard. Meat flavorings and tenderizers. Bouillon cubes. Hot sauce and Tabasco sauce. Premade or packaged marinades. Premade or packaged taco seasonings. Relishes. Regular salad dressings. Where to  find more information:  National Heart, Lung, and [place]: https://wilson-eaton.com/  American Heart Association: www.heart.org Summary  The DASH eating plan is a healthy eating plan that has been shown to reduce high blood pressure (hypertension). It may also reduce your risk for type 2 diabetes, heart disease, and stroke.  With the DASH eating plan, you should limit salt (sodium) intake to 2,300 mg a day. If you have hypertension, you may need to reduce your sodium intake to 1,500 mg a day.  When on the DASH eating plan, aim to eat more fresh fruits and vegetables, whole grains, lean proteins, low-fat dairy, and heart-healthy fats.  Work with your health care provider or diet and nutrition specialist (dietitian) to adjust your eating plan to your individual calorie needs. This information is not intended to replace advice given to you by your health care provider. Make sure you discuss any questions you have with your health care provider. Document Revised: 12/25/2016 Document Reviewed: 01/06/2016 Elsevier Patient Education  2020 [place] for Diabetes Mellitus, Adult  Carbohydrate counting is a method of keeping track of how many carbohydrates you eat. Eating carbohydrates naturally increases the amount of sugar (glucose) in the blood. Counting how many carbohydrates you eat helps keep your blood glucose within normal limits, which helps you manage your diabetes (diabetes mellitus). It is important to know how many carbohydrates you can safely have in each meal. This is different for every person. A diet and nutrition specialist (registered dietitian) can help you make a meal plan and calculate how many carbohydrates you should have at each meal and snack. Carbohydrates are found in the following foods:  Grains, such as breads and cereals.  Dried beans and soy products.  Starchy vegetables, such as potatoes, peas, and corn.  Fruit and fruit juices.  Milk  and yogurt.  Sweets and snack foods, such as cake, cookies, candy, chips, and soft drinks. How do  I count carbohydrates? There are two ways to count carbohydrates in food. You can use either of the methods or a combination of both. Reading "Nutrition Facts" on packaged food The "Nutrition Facts" list is included on the labels of almost all packaged foods and beverages in the [place] It includes:  The serving size.  Information about nutrients in each serving, including the grams (g) of carbohydrate per serving. To use the "Nutrition Facts":  Decide how many servings you will have.  Multiply the number of servings by the number of carbohydrates per serving.  The resulting number is the total amount of carbohydrates that you will be having. Learning standard serving sizes of other foods When you eat carbohydrate foods that are not packaged or do not include "Nutrition Facts" on the label, you need to measure the servings in order to count the amount of carbohydrates:  Measure the foods that you will eat with a food scale or measuring cup, if needed.  Decide how many standard-size servings you will eat.  Multiply the number of servings by 15. Most carbohydrate-rich foods have about 15 g of carbohydrates per serving. ? For example, if you eat 8 oz (170 g) of strawberries, you will have eaten 2 servings and 30 g of carbohydrates (2 servings x 15 g = 30 g).  For foods that have more than one food mixed, such as soups and casseroles, you must count the carbohydrates in each food that is included. The following list contains standard serving sizes of common carbohydrate-rich foods. Each of these servings has about 15 g of carbohydrates:   hamburger bun or  English muffin.   oz (15 mL) syrup.   oz (14 g) jelly.  1 slice of bread.  1 six-inch tortilla.  3 oz (85 g) cooked rice or pasta.  4 oz (113 g) cooked dried beans.  4 oz (113 g) starchy vegetable, such as peas, corn, or  potatoes.  4 oz (113 g) hot cereal.  4 oz (113 g) mashed potatoes or  of a large baked potato.  4 oz (113 g) canned or frozen fruit.  4 oz (120 mL) fruit juice.  4-6 crackers.  6 chicken nuggets.  6 oz (170 g) unsweetened dry cereal.  6 oz (170 g) plain fat-free yogurt or yogurt sweetened with artificial sweeteners.  8 oz (240 mL) milk.  8 oz (170 g) fresh fruit or one small piece of fruit.  24 oz (680 g) popped popcorn. Example of carbohydrate counting Sample meal  3 oz (85 g) chicken breast.  6 oz (170 g) brown rice.  4 oz (113 g) corn.  8 oz (240 mL) milk.  8 oz (170 g) strawberries with sugar-free whipped topping. Carbohydrate calculation 1. Identify the foods that contain carbohydrates: ? Rice. ? Corn. ? Milk. ? Strawberries. 2. Calculate how many servings you have of each food: ? 2 servings rice. ? 1 serving corn. ? 1 serving milk. ? 1 serving strawberries. 3. Multiply each number of servings by 15 g: ? 2 servings rice x 15 g = 30 g. ? 1 serving corn x 15 g = 15 g. ? 1 serving milk x 15 g = 15 g. ? 1 serving strawberries x 15 g = 15 g. 4. Add together all of the amounts to find the total grams of carbohydrates eaten: ? 30 g + 15 g + 15 g + 15 g = 75 g of carbohydrates total. Summary  Carbohydrate counting is a method of  keeping track of how many carbohydrates you eat.  Eating carbohydrates naturally increases the amount of sugar (glucose) in the blood.  Counting how many carbohydrates you eat helps keep your blood glucose within normal limits, which helps you manage your diabetes.  A diet and nutrition specialist (registered dietitian) can help you make a meal plan and calculate how many carbohydrates you should have at each meal and snack. This information is not intended to replace advice given to you by your health care provider. Make sure you discuss any questions you have with your health care provider. Document Revised: 08/06/2016 Document  Reviewed: 06/26/2015 Elsevier Patient Education  2020 [place].  Heart Failure Eating Plan Heart failure, also called congestive heart failure, occurs when your heart does not pump blood well enough to meet your body's needs for oxygen-rich blood. Heart failure is a long-term (chronic) condition. Living with heart failure can be challenging. However, following your health care provider's instructions about a healthy lifestyle and working with a diet and nutrition specialist (dietitian) to choose the right foods may help to improve your symptoms. What are tips for following this plan? Reading food labels  Check food labels for the amount of sodium per serving. Choose foods that have less than 140 mg (milligrams) of sodium in each serving.  Check food labels for the number of calories per serving. This is important if you need to limit your daily calorie intake to lose weight.  Check food labels for the serving size. If you eat more than one serving, you will be eating more sodium and calories than what is listed on the label.  Look for foods that are labeled as "sodium-free," "very low sodium," or "low sodium." ? Foods labeled as "reduced sodium" or "lightly salted" may still have more sodium than what is recommended for you. Cooking  Avoid adding salt when cooking. Ask your health care provider or dietitian before using salt substitutes.  Season food with salt-free seasonings, spices, or herbs. Check the label of seasoning mixes to make sure they do not contain salt.  Cook with heart-healthy oils, such as olive, canola, soybean, or sunflower oil.  Do not fry foods. Cook foods using low-fat methods, such as baking, boiling, grilling, and broiling.  Limit unhealthy fats when cooking by: ? Removing the skin from poultry, such as chicken. ? Removing all visible fats from meats. ? Skimming the fat off from stews, soups, and gravies before serving them. Meal planning   Limit your  intake of: ? Processed, canned, or pre-packaged foods. ? Foods that are high in trans fat, such as fried foods. ? Sweets, desserts, sugary drinks, and other foods with added sugar. ? Full-fat dairy products, such as whole milk.  Eat a balanced diet that includes: ? 4-5 servings of fruit each day and 4-5 servings of vegetables each day. At each meal, try to fill half of your plate with fruits and vegetables. ? Up to 6-8 servings of whole grains each day. ? Up to 2 servings of lean meat, poultry, or fish each day. One serving of meat is equal to 3 oz. This is about the same size as a deck of cards. ? 2 servings of low-fat dairy each day. ? Heart-healthy fats. Healthy fats called omega-3 fatty acids are found in foods such as flaxseed and cold-water fish like sardines, salmon, and mackerel.  Aim to eat 25-35 g (grams) of fiber a day. Foods that are high in fiber include apples, broccoli, carrots, beans, peas,  and whole grains.  Do not add salt or condiments that contain salt (such as soy sauce) to foods before eating.  When eating at a restaurant, ask that your food be prepared with less salt or no salt, if possible.  Try to eat 2 or more vegetarian meals each week.  Eat more home-cooked food and eat less restaurant, buffet, and fast food. General information  Do not eat more than 2,300 mg of salt (sodium) a day. The amount of sodium that is recommended for you may be lower, depending on your condition.  Maintain a healthy body weight as directed. Ask your health care provider what a healthy weight is for you. ? Check your weight every day. ? Work with your health care provider and dietitian to make a plan that is right for you to lose weight or maintain your current weight.  Limit how much fluid you drink. Ask your health care provider or dietitian how much fluid you can have each day.  Limit or avoid alcohol as told by your health care provider or dietitian. Recommended foods The  items listed may not be a complete list. Talk with your dietitian about what dietary choices are best for you. Fruits All fresh, frozen, and canned fruits. Dried fruits, such as raisins, prunes, and cranberries. Vegetables All fresh vegetables. Vegetables that are frozen without sauce or added salt. Low-sodium or sodium-free canned vegetables. Grains Bread with less than 80 mg of sodium per slice. Whole-wheat pasta, quinoa, and brown rice. Oats and oatmeal. Barley. [place]. Grits and cream of wheat. Whole-grain and whole-wheat cold cereal. Meats and other protein foods Lean cuts of meat. Skinless chicken and [place]. Fish with high omega-3 fatty acids, such as salmon, sardines, and other cold-water fishes. Eggs. Dried beans, peas, and edamame. Unsalted nuts and nut butters. Dairy Low-fat or nonfat (skim) milk and dried milk. Rice milk, soy milk, and almond milk. Low-fat or nonfat yogurt. Small amounts of reduced-sodium block cheese. Low-sodium cottage cheese. Fats and oils Olive, canola, soybean, flaxseed, or sunflower oil. Avocado. Sweets and desserts Apple sauce. Granola bars. Sugar-free pudding and gelatin. Frozen fruit bars. Seasoning and other foods Fresh and dried herbs. Lemon or lime juice. Vinegar. Low-sodium ketchup. Salt-free marinades, salad dressings, sauces, and seasonings. The items listed above may not be a complete list of foods and beverages you can eat. Contact a dietitian for more information. Foods to avoid The items listed may not be a complete list. Talk with your dietitian about what dietary choices are best for you. Fruits Fruits that are dried with sodium-containing preservatives. Vegetables Canned vegetables. Frozen vegetables with sauce or seasonings. Creamed vegetables. [place] fries. Onion rings. Pickled vegetables and sauerkraut. Grains Bread with more than 80 mg of sodium per slice. Hot or cold cereal with more than 140 mg sodium per serving. Salted pretzels and  crackers. Pre-packaged breadcrumbs. Bagels, croissants, and biscuits. Meats and other protein foods Ribs and chicken wings. Bacon, ham, pepperoni, bologna, salami, and packaged luncheon meats. Hot dogs, bratwurst, and sausage. Canned meat. Smoked meat and fish. Salted nuts and seeds. Dairy Whole milk, half-and-half, and cream. Buttermilk. Processed cheese, cheese spreads, and cheese curds. Regular cottage cheese. Feta cheese. Shredded cheese. String cheese. Fats and oils Butter, lard, shortening, ghee, and bacon fat. Canned and packaged gravies. Seasoning and other foods Onion salt, garlic salt, table salt, and sea salt. Marinades. Regular salad dressings. Relishes, pickles, and olives. Meat flavorings and tenderizers, and bouillon cubes. Horseradish, ketchup, and mustard. Worcestershire sauce. Teriyaki  sauce, soy sauce (including reduced sodium). Hot sauce and Tabasco sauce. Steak sauce, fish sauce, oyster sauce, and cocktail sauce. Taco seasonings. Barbecue sauce. Tartar sauce. The items listed above may not be a complete list of foods and beverages you should avoid. Contact a dietitian for more information. Summary  A heart failure eating plan includes changes that limit your intake of sodium and unhealthy fat, and it may help you lose weight or maintain a healthy weight. Your health care provider may also recommend limiting how much fluid you drink.  Most people with heart failure should eat no more than 2,300 mg of salt (sodium) a day. The amount of sodium that is recommended for you may be lower, depending on your condition.  Contact your health care provider or dietitian before making any major changes to your diet. This information is not intended to replace advice given to you by your health care provider. Make sure you discuss any questions you have with your health care provider. Document Revised: 03/10/2018 Document Reviewed: 05/29/2016 Elsevier Patient Education  [place].

## 2019-05-18 NOTE — Chronic Care Management (AMB) (Signed)
Chronic Care Management   Follow Up Note   05/18/2019 Name: [name] MRN: [id number] DOB: 1965/04/08  Referred by: [name], DO Reason for referral : Chronic Care Management (Initial Outreach: Chronic Disease Management:and Care Coordination Needs)   [name] is a 54 y.o. year old male who is a primary care patient of [name], DO. The CCM team was consulted for assistance with chronic disease management and care coordination needs.    Review of patient status, including review of consultants reports, relevant laboratory and other test results, and collaboration with appropriate care team members and the patient's provider was performed as part of comprehensive patient evaluation and provision of chronic care management services.    SDOH (Social Determinants of Health) assessments performed: Yes See Care Plan activities for detailed interventions related to [facility])     Outpatient Encounter Medications as of 05/18/2019  Medication Sig Note  . albuterol (PROVENTIL) (2.5 MG/3ML) 0.083% nebulizer solution Take 3 mLs (2.5 mg total) by nebulization every 6 (six) hours as needed for wheezing or shortness of breath.   [name] Kitchen albuterol (VENTOLIN HFA) 108 (90 Base) MCG/ACT inhaler Inhale 2 puffs into the lungs every 4 (four) hours as needed for wheezing or shortness of breath. (Patient not taking: Reported on 01/24/2019)   . allopurinol (ZYLOPRIM) 100 MG tablet Take 2 tablets (200 mg total) by mouth daily.   [name] Kitchen aspirin 81 MG chewable tablet Chew 81 mg by mouth daily.   [name] Kitchen atorvastatin (LIPITOR) 20 MG tablet Take 1 tablet (20 mg total) by mouth daily.   . blood glucose meter kit and supplies KIT Brand of choice; LON 99 months; check FSBS 3x a day; E11.65; disp one meter with 100 strips + 1 refill of strips   . Blood Glucose Monitoring Suppl (ONE TOUCH ULTRA 2) w/Device KIT Use to check blood glucose up to 3 x daily   . bumetanide (BUMEX) 1 MG tablet Take 1 mg  by mouth 2 (two) times daily. Per Dr. Juleen China 10/06/2018: Taking 2 mg (2 tablets) each morning and 1 mg (1 tablet) each afternoon (as last prescribed by Cardiology)  . carvedilol (COREG) 25 MG tablet Take 2 tablets (50 mg total) by mouth 2 (two) times daily with a meal.   . CLARITIN 10 MG tablet Take 1 tablet (10 mg total) by mouth daily. One po daily x 5 days   . Dulaglutide (TRULICITY) 4.23 TR/3.2YE SOPN Inject 0.75 mg into the skin once a week. 07/20/2018: Taking on Fridays  . hydrOXYzine (VISTARIL) 25 MG capsule Take 1 capsule (25 mg total) by mouth every 8 (eight) hours as needed for itching.   . Insulin Glargine (BASAGLAR KWIKPEN) 100 UNIT/ML SOPN Inject 15 Units into the skin daily. Submitted to [employer] for assistance 10/06/2018: Reports taking 14 units each morning  . Insulin Pen Needle (B-D UF III MINI PEN NEEDLES) 31G X 5 MM MISC 1 each by Does not apply route daily. Use once daily with Victoza.   . Lancets (ONETOUCH ULTRASOFT) lancets Check blood glucose up to 3 x daily   . lisinopril (ZESTRIL) 2.5 MG tablet Take 1 tablet (2.5 mg total) by mouth daily.   . metolazone (ZAROXOLYN) 5 MG tablet Take 5 mg by mouth daily. as needed (for weight gain, as directed by your provider).   [name] Kitchen NEXIUM 40 MG capsule Take 1 capsule (40 mg total) by mouth 2 (two) times daily before a meal.   . ONE TOUCH ULTRA TEST test  strip Check sugar 3 x daily   . simethicone (MYLICON) 175 MG chewable tablet Chew 125 mg by mouth every 6 (six) hours as needed for flatulence.   . traMADol (ULTRAM) 50 MG tablet Take 1-2 tablets (50-100 mg total) by mouth every 12 (twelve) hours as needed.   . vitamin C (ASCORBIC ACID) 500 MG tablet Take 1,000 mg by mouth daily.    No facility-administered encounter medications on file as of 05/18/2019.     Objective:  BP Readings from Last 3 Encounters:  03/08/18 107/76  03/05/18 134/87  12/02/17 136/65   Lab Results  Component Value Date   HGBA1C 6.1 (H) 10/05/2018    Goals  Addressed            This Visit's Progress   . COMPLETED: I am trying to stay healthy (pt-stated)       Current Barriers:  . Chronic Disease Management support and education needs related to managing multiple chronic disease processes  Nurse Case Manager Clinical Goal(s):  [name] Kitchen Over the next 90 days, patient will work with [facility]  to address needs related to managing multiple chronic disease processes ie  DM2, COPD, HF.   Interventions:  . Advised patient to think about weight loss options . Collaborated with Grayland Ormond Pharm-D regarding patient's current medication regimen . Advised patient, providing education and rationale, to weigh daily and record, calling Cardiologist for weight gain of 3lbs overnight or 5 pounds in a week.  . Reviewed sodium and fluid restrictions set by cardiology.   Patient Self Care Activities:  . Currently UNABLE TO independently and effectively manage multiple chronic disease pocesses   Initial goal documentation     . RNCM: "I don't take my blood pressure it is always good" (pt-stated)       CARE PLAN ENTRY (see longtitudinal plan of care for additional care plan information)  Current Barriers:  . Chronic Disease Management support, education, and care coordination needs related to CHF, HTN, HLD, DMII, and CKD Stage 4  Clinical Goal(s) related to CHF, HTN, HLD, DMII, and CKD Stage 4 :  Over the next 120 days, patient will:  . Work with the care management team to address educational, disease management, and care coordination needs  . Begin or continue self health monitoring activities as directed today Measure and record cbg (blood glucose) 1 times daily, Measure and record blood pressure 1 times per week, Measure and record weight daily, and adhere to a heart healthy ADA diet . Call provider office for new or worsened signs and symptoms Blood glucose findings outside established parameters, Blood pressure findings outside established parameters, Weight  outside established parameters, Chest pain, Shortness of breath, and New or worsened symptom related to chronic health conditions . Call care management team with questions or concerns . Verbalize basic understanding of patient centered plan of care established today  Interventions related to CHF, HTN, HLD, DMII, and CKD Stage 4 :  . Evaluation of current treatment plans and patient's adherence to plan as established by provider . Assessed patient understanding of disease states.  The patient verbalized understanding of his chronic conditions but does not check his blood pressures at home and takes his blood sugars every other day.  Last reported blood sugars 107 and 121. Education on checking blood sugars daily and checking his blood pressure at least one time a week and record bringing record to the MD visits with him.   . Evaluation of daily weights. The patient states his current  weight is 335.  Endorses weighing daily.  The patient states that sometimes he can have a 10 pound shift in his weight. Education on calling provider for weight changes +/- 3 pounds in one day or +/- 5 pounds in one week. The patient verbalized his weight fluctuates based on how much water he drinks.  . Assessed patient's education and care coordination needs.  The patient verbalized he would like to know more about healthy food options and getting fresh produce like avocados and tomatoes.  Will do a care guide referral for resources in [facility] that would help the patient get fresh foods. The patient also expressed the need for help with utilities and housing expenses.  . Provided disease specific education to patient: Information/education on heart healthy and diabetes by [employer] functionality.  Nash Dimmer with appropriate clinical care team members regarding patient needs.  The patient is involved with Pharm D and LCSW on the team.   Patient Self Care Activities related to CHF, HTN, HLD, DMII, and CKD Stage 4 :   . Patient is unable to independently self-manage chronic health conditions  Initial goal documentation         Plan:   The care management team will reach out to the patient again over the next 60 days.    Noreene Larsson RN, MSN, [place] Mobile: [phone]

## 2019-05-30 ENCOUNTER — Telehealth: Payer: Self-pay | Admitting: Family Medicine

## 2019-05-30 NOTE — Telephone Encounter (Signed)
  Community Resource Referral   [place] 05/30/2019  Name: [name]   MRN: [id number]   DOB: 1965-10-06   AGE: 54 y.o.   GENDER: male   PCP [name], DO.   Called pt regarding [employer] Referral for food and housing. Pt stated that he receives $16 in food stamps and $200 in EBT has tried for several years to apply for Medicaid and was turned down b/c he made about $20 too much. Mr. Isenberg asked that I email him list of food banks in [facility] that he can review. Discussed [employer] food pant[employer] and [employer] as two possible options for additional fresh fruits and vegetables. His daughter will be 17 this June and he stated that he will need to look for more affordable housing when she turns 10. Discussed [employer] and getting on their waitlist, he would prefer to stay where he is now if he could get their help paying his rent (he said he knew of another person who had [facility] pay their housing for a house rental). Pt asked that I email him the resources I have for affordable housing options for [place] as well as contact information for [employer].  Care Guide to email food bank information and housing options for pt, Closing referral pending any other needs of patient.     [place] . [email or web address]  [phone]

## 2019-05-30 NOTE — Telephone Encounter (Signed)
Email to pt  From: Jill Alexanders [facility])  Sent: Tuesday, May 30, 2019 11:46 AM To: [email or web address] Subject: SECURE: [place] Morning Mr. Kleinman, Please see attached list of food banks and the meal calendar through [employer].  I have also included a list of income based housing choices for [facility]. As we discussed on the phone this morning, the [employer] has a wait list. Please call them to discuss options for you staying at your [employer] address after your daughter turns 35 next year and determine your eligibility for their services. [place] [phone] [address] [place], [place] [address] [email or web address]  Please let me know if you need anything further,  [place] . [email or web address]  [phone]

## 2019-06-02 ENCOUNTER — Telehealth: Payer: Self-pay

## 2019-06-02 NOTE — Telephone Encounter (Signed)
The pt was notified that his Trulicity came in from patient assistance program. He was also notified that he got 72 hrs to pick up the medications.

## 2019-06-05 DIAGNOSIS — R809 Proteinuria, unspecified: Secondary | ICD-10-CM | POA: Diagnosis not present

## 2019-06-05 DIAGNOSIS — I129 Hypertensive chronic kidney disease with stage 1 through stage 4 chronic kidney disease, or unspecified chronic kidney disease: Secondary | ICD-10-CM | POA: Diagnosis not present

## 2019-06-05 DIAGNOSIS — E1122 Type 2 diabetes mellitus with diabetic chronic kidney disease: Secondary | ICD-10-CM | POA: Diagnosis not present

## 2019-06-05 DIAGNOSIS — N2581 Secondary hyperparathyroidism of renal origin: Secondary | ICD-10-CM | POA: Diagnosis not present

## 2019-06-05 DIAGNOSIS — N184 Chronic kidney disease, stage 4 (severe): Secondary | ICD-10-CM | POA: Diagnosis not present

## 2019-06-05 DIAGNOSIS — E875 Hyperkalemia: Secondary | ICD-10-CM | POA: Diagnosis not present

## 2019-06-06 ENCOUNTER — Other Ambulatory Visit: Payer: Self-pay

## 2019-06-06 ENCOUNTER — Encounter: Payer: Self-pay | Admitting: Family Medicine

## 2019-06-06 ENCOUNTER — Other Ambulatory Visit: Payer: Self-pay | Admitting: Family Medicine

## 2019-06-06 ENCOUNTER — Ambulatory Visit (INDEPENDENT_AMBULATORY_CARE_PROVIDER_SITE_OTHER): Payer: PPO | Admitting: Family Medicine

## 2019-06-06 DIAGNOSIS — J0110 Acute frontal sinusitis, unspecified: Secondary | ICD-10-CM | POA: Diagnosis not present

## 2019-06-06 DIAGNOSIS — M1A39X0 Chronic gout due to renal impairment, multiple sites, without tophus (tophi): Secondary | ICD-10-CM

## 2019-06-06 DIAGNOSIS — J3089 Other allergic rhinitis: Secondary | ICD-10-CM

## 2019-06-06 MED ORDER — AZITHROMYCIN 250 MG PO TABS: ORAL_TABLET | ORAL | 0 refills | Status: DC

## 2019-06-06 MED ORDER — ALLOPURINOL 100 MG PO TABS: 200.0000 mg | ORAL_TABLET | Freq: Every day | ORAL | 1 refills | Status: DC

## 2019-06-06 NOTE — Patient Instructions (Addendum)
Start Zpak for sinuses Continue allergy meds as needed  Please schedule a Follow-up Appointment to: Return in about 1 week (around 06/13/2019) for DM A1c.  If you have any other questions or concerns, please feel free to call the office or send a message through [place]. You may also schedule an earlier appointment if necessary.  Additionally, you may be receiving a survey about your experience at our office within a few days to 1 week by e-mail or mail. We value your feedback.  [name], DO [place]

## 2019-06-06 NOTE — Progress Notes (Signed)
Virtual Visit via Telephone The purpose of this virtual visit is to provide medical care while limiting exposure to the novel coronavirus (COVID19) for both patient and office staff.  Consent was obtained for phone visit:  Yes.   Answered questions that patient had about telehealth interaction:  Yes.   I discussed the limitations, risks, security and privacy concerns of performing an evaluation and management service by telephone. I also discussed with the patient that there may be a patient responsible charge related to this service. The patient expressed understanding and agreed to proceed.  Patient Location: Home Provider Location: [facility])  ---------------------------------------------------------------------- Chief Complaint  Patient presents with  . Sinusitis    onset 2 weeks, nasal drips denies cough, fever or chills     S: Reviewed CMA documentation. I have called patient and gathered additional HPI as follows:  Sinusitis, acute  Reports that symptoms started over 2 weeks ago, with sinus congestion pressure pain drainage. Also has history of allergic rhinosinustis. Previously seen by ENT in past. Tried OTC Mucinex, anti  histamine, sinus treatments, mixed results, this time did not resolve, in past he has required azithromycin z-pak  Additional updates:  Chronic Neuropathy On Tramadol regularly for arthritis and neuropathy pain. Not due yet for refill  Admits sinus pain and pressure Denies any fevers, chills, sweats, body ache, cough, shortness of breath, headache, abdominal pain, diarrhea  Past Medical History:  Diagnosis Date  . Allergy   . Asthma   . Gout   . Hypertension   . Morbid obesity ([place])    Social History   Tobacco Use  . Smoking status: Never Smoker  . Smokeless tobacco: Never Used  Substance Use Topics  . Alcohol use: Yes    Comment: a glass of wine and a couple beers a day  . Drug use: No    Current Outpatient  Medications:  .  albuterol (PROVENTIL) (2.5 MG/3ML) 0.083% nebulizer solution, Take 3 mLs (2.5 mg total) by nebulization every 6 (six) hours as needed for wheezing or shortness of breath., Disp: 150 mL, Rfl: 1 .  albuterol (VENTOLIN HFA) 108 (90 Base) MCG/ACT inhaler, Inhale 2 puffs into the lungs every 4 (four) hours as needed for wheezing or shortness of breath., Disp: 18 g, Rfl: 2 .  aspirin 81 MG chewable tablet, Chew 81 mg by mouth daily., Disp: , Rfl:  .  atorvastatin (LIPITOR) 20 MG tablet, Take 1 tablet (20 mg total) by mouth daily., Disp: 90 tablet, Rfl: 3 .  blood glucose meter kit and supplies KIT, Brand of choice; LON 99 months; check FSBS 3x a day; E11.65; disp one meter with 100 strips + 1 refill of strips, Disp: 1 each, Rfl: 0 .  Blood Glucose Monitoring Suppl (ONE TOUCH ULTRA 2) w/Device KIT, Use to check blood glucose up to 3 x daily, Disp: 1 each, Rfl: 0 .  bumetanide (BUMEX) 1 MG tablet, Take 1 mg by mouth 2 (two) times daily. Per Dr. Juleen China, Disp: , Rfl:  .  carvedilol (COREG) 25 MG tablet, Take 2 tablets (50 mg total) by mouth 2 (two) times daily with a meal., Disp: 360 tablet, Rfl: 1 .  CLARITIN 10 MG tablet, Take 1 tablet (10 mg total) by mouth daily. One po daily x 5 days, Disp: 90 tablet, Rfl: 3 .  Dulaglutide (TRULICITY) 4.16 LA/4.5XM SOPN, Inject 0.75 mg into the skin once a week., Disp: , Rfl:  .  hydrOXYzine (VISTARIL) 25 MG capsule, Take 1  capsule (25 mg total) by mouth every 8 (eight) hours as needed for itching., Disp: 60 capsule, Rfl: 1 .  Insulin Glargine (BASAGLAR KWIKPEN) 100 UNIT/ML SOPN, Inject 15 Units into the skin daily. Submitted to [employer] for assistance, Disp: , Rfl:  .  Insulin Pen Needle (B-D UF III MINI PEN NEEDLES) 31G X 5 MM MISC, 1 each by Does not apply route daily. Use once daily with Victoza., Disp: 100 each, Rfl: 12 .  Lancets (ONETOUCH ULTRASOFT) lancets, Check blood glucose up to 3 x daily, Disp: 300 each, Rfl: 3 .  lisinopril (ZESTRIL) 2.5  MG tablet, Take 1 tablet (2.5 mg total) by mouth daily., Disp: 90 tablet, Rfl: 3 .  metolazone (ZAROXOLYN) 5 MG tablet, Take 5 mg by mouth daily. as needed (for weight gain, as directed by your provider)., Disp: , Rfl:  .  NEXIUM 40 MG capsule, Take 1 capsule (40 mg total) by mouth 2 (two) times daily before a meal., Disp: 180 capsule, Rfl: 1 .  ONE TOUCH ULTRA TEST test strip, Check sugar 3 x daily, Disp: 300 each, Rfl: 3 .  simethicone (MYLICON) 570 MG chewable tablet, Chew 125 mg by mouth every 6 (six) hours as needed for flatulence., Disp: , Rfl:  .  traMADol (ULTRAM) 50 MG tablet, Take 1-2 tablets (50-100 mg total) by mouth every 12 (twelve) hours as needed., Disp: 30 tablet, Rfl: 2 .  vitamin C (ASCORBIC ACID) 500 MG tablet, Take 1,000 mg by mouth daily., Disp: , Rfl:  .  allopurinol (ZYLOPRIM) 100 MG tablet, Take 2 tablets (200 mg total) by mouth daily., Disp: 180 tablet, Rfl: 1 .  azithromycin (ZITHROMAX Z-PAK) 250 MG tablet, Take 2 tabs (590m total) on Day 1. Take 1 tab (2585m daily for next 4 days., Disp: 6 tablet, Rfl: 0  [facility] 05/18/2019 01/24/2019 01/02/2019  Decreased Interest 0 0 0  Down, Depressed, Hopeless 0 0 0  PHQ - 2 Score 0 0 0    No flowsheet data found.  -------------------------------------------------------------------------- O: No physical exam performed due to remote telephone encounter.  Lab results reviewed.  No results found for this or any previous visit (from the past 2160 hour(s)).  -------------------------------------------------------------------------- A&P:  Problem List Items Addressed This Visit    Allergic rhinitis    Other Visit Diagnoses    Acute non-recurrent frontal sinusitis    -  Primary   Relevant Medications   azithromycin (ZITHROMAX Z-PAK) 250 MG tablet     Consistent with acute frontal sinusitis, likely initially viral URI vs allergic rhinitis component with worsening concern for bacterial infection.    Plan: 1. Start Azithromycin Z pak (antibiotic) 2 tabs day 1, then 1 tab x 4 days, complete entire course even if improved 2. Continue OTC sinus regimen and allergy therapy 3. Supportive care with nasal saline OTC, hydration 4. Return criteria reviewed   Meds ordered this encounter  Medications  . azithromycin (ZITHROMAX Z-PAK) 250 MG tablet    Sig: Take 2 tabs (50064motal) on Day 1. Take 1 tab (250m71maily for next 4 days.    Dispense:  6 tablet    Refill:  0    Follow-up: - Return in 1 week for Diabetic follow-up A1c Foot exam  Patient verbalizes understanding with the above medical recommendations including the limitation of remote medical advice.  Specific follow-up and call-back criteria were given for patient to follow-up or seek medical care more urgently if needed.   - Time spent  in direct consultation with patient on phone: 9 minutes   [name], [facility] 06/06/2019, 3:55 PM

## 2019-06-08 ENCOUNTER — Telehealth: Payer: Self-pay

## 2019-06-13 ENCOUNTER — Ambulatory Visit: Payer: PPO | Admitting: Family Medicine

## 2019-06-23 ENCOUNTER — Ambulatory Visit (INDEPENDENT_AMBULATORY_CARE_PROVIDER_SITE_OTHER): Payer: PPO | Admitting: Pharmacist

## 2019-06-23 ENCOUNTER — Other Ambulatory Visit: Payer: Self-pay | Admitting: Family Medicine

## 2019-06-23 DIAGNOSIS — J4520 Mild intermittent asthma, uncomplicated: Secondary | ICD-10-CM

## 2019-06-23 DIAGNOSIS — K219 Gastro-esophageal reflux disease without esophagitis: Secondary | ICD-10-CM

## 2019-06-23 DIAGNOSIS — Z794 Long term (current) use of insulin: Secondary | ICD-10-CM

## 2019-06-23 DIAGNOSIS — N184 Chronic kidney disease, stage 4 (severe): Secondary | ICD-10-CM | POA: Diagnosis not present

## 2019-06-23 DIAGNOSIS — E1122 Type 2 diabetes mellitus with diabetic chronic kidney disease: Secondary | ICD-10-CM | POA: Diagnosis not present

## 2019-06-23 DIAGNOSIS — I10 Essential (primary) hypertension: Secondary | ICD-10-CM

## 2019-06-23 MED ORDER — ALBUTEROL SULFATE HFA 108 (90 BASE) MCG/ACT IN AERS: 2.0000 | INHALATION_SPRAY | RESPIRATORY_TRACT | 3 refills | Status: DC | PRN

## 2019-06-23 NOTE — Chronic Care Management (AMB) (Signed)
Chronic Care Management   Follow Up Note   06/23/2019 Name: [name] MRN: [id number] DOB: [birth year]  Referred by: [name], DO Reason for referral : Chronic Care Management (Patient Phone Call)   [name] is a 54 y.o. year old male who is a primary care patient of [name], DO. The CCM team was consulted for assistance with chronic disease management and care coordination needs.  [name] has a past medical history including but not limited to Type 2 diabetes, hypertension, CKD, chronic systolic heart failure, gout, seasonal allergies and GERD.  I reached out to [name] by phone today.   Review of patient status, including review of consultants reports, relevant laboratory and other test results, and collaboration with appropriate care team members and the patient's provider was performed as part of comprehensive patient evaluation and provision of chronic care management services.     Outpatient Encounter Medications as of 06/23/2019  Medication Sig Note  . albuterol (PROVENTIL) (2.5 MG/3ML) 0.083% nebulizer solution Take 3 mLs (2.5 mg total) by nebulization every 6 (six) hours as needed for wheezing or shortness of breath.   . allopurinol (ZYLOPRIM) 100 MG tablet Take 2 tablets (200 mg total) by mouth daily.   [name] Kitchen aspirin 81 MG chewable tablet Chew 81 mg by mouth daily.   [name] Kitchen atorvastatin (LIPITOR) 20 MG tablet Take 1 tablet (20 mg total) by mouth daily.   . bumetanide (BUMEX) 1 MG tablet Take 1 mg by mouth 2 (two) times daily. Per Dr. Juleen China 10/06/2018: Taking 2 mg (2 tablets) each morning and 1 mg (1 tablet) each afternoon (as last prescribed by Cardiology)  . carvedilol (COREG) 25 MG tablet Take 2 tablets (50 mg total) by mouth 2 (two) times daily with a meal.   . CLARITIN 10 MG tablet Take 1 tablet (10 mg total) by mouth daily. One po daily x 5 days   . Dulaglutide (TRULICITY) 2.63 FH/5.4TG SOPN Inject 0.75 mg into the skin  once a week. 07/20/2018: Taking on Fridays  . Insulin Glargine (BASAGLAR KWIKPEN) 100 UNIT/ML SOPN Inject 15 Units into the skin daily. Submitted to [employer] for assistance 10/06/2018: Reports taking 14 units each morning  . lisinopril (ZESTRIL) 2.5 MG tablet Take 1 tablet (2.5 mg total) by mouth daily.   . metolazone (ZAROXOLYN) 5 MG tablet Take 5 mg by mouth daily. as needed (for weight gain, as directed by your provider).   [name] Kitchen NEXIUM 40 MG capsule Take 1 capsule (40 mg total) by mouth 2 (two) times daily before a meal.   . simethicone (MYLICON) 256 MG chewable tablet Chew 125 mg by mouth every 6 (six) hours as needed for flatulence.   . traMADol (ULTRAM) 50 MG tablet Take 1-2 tablets (50-100 mg total) by mouth every 12 (twelve) hours as needed.   . vitamin C (ASCORBIC ACID) 500 MG tablet Take 1,000 mg by mouth daily.   [name] Kitchen albuterol (VENTOLIN HFA) 108 (90 Base) MCG/ACT inhaler Inhale 2 puffs into the lungs every 4 (four) hours as needed for wheezing or shortness of breath. (Patient not taking: Reported on 06/23/2019)   . blood glucose meter kit and supplies KIT Brand of choice; LON 99 months; check FSBS 3x a day; E11.65; disp one meter with 100 strips + 1 refill of strips   . Blood Glucose Monitoring Suppl (ONE TOUCH ULTRA 2) w/Device KIT Use to check blood glucose up to 3 x daily   . calcitRIOL (ROCALTROL) 0.5  MCG capsule Take 0.5 mcg by mouth daily.   . hydrOXYzine (VISTARIL) 25 MG capsule Take 1 capsule (25 mg total) by mouth every 8 (eight) hours as needed for itching. (Patient not taking: Reported on 06/23/2019)   . Insulin Pen Needle (B-D UF III MINI PEN NEEDLES) 31G X 5 MM MISC 1 each by Does not apply route daily. Use once daily with Victoza.   . Lancets (ONETOUCH ULTRASOFT) lancets Check blood glucose up to 3 x daily   . ONE TOUCH ULTRA TEST test strip Check sugar 3 x daily   . [DISCONTINUED] azithromycin (ZITHROMAX Z-PAK) 250 MG tablet Take 2 tabs ('500mg'$  total) on Day 1. Take 1 tab ('250mg'$ )  daily for next 4 days. (Patient not taking: Reported on 06/23/2019)    No facility-administered encounter medications on file as of 06/23/2019.    Goals Addressed            This Visit's Progress   . PharmD - Medication Management       CARE PLAN ENTRY (see longitudinal plan of care for additional care plan information)  Current Barriers:  . Financial Barriers  . Lack of blood sugar or blood pressure results for the clinical team  Pharmacist Clinical Goal(s):  [name] Kitchen Over the next 30 days, patient will work with CM Pharmacist to address needs related to medication therapy optimization  Interventions: . Perform chart review . Follow up with patient regarding sinusitis ? Reports resolved, completed Z-Pak as prescribed by PCP on 5/11 . Comprehensive medication review performed; medication and allergy lists updated in electronic medical record ? Identify patient needing new Rx: albuterol HFA inhaler. Previous Rx expired.  ? Reports had not obtained refill previously due to copayment. Review health plan formulary and advise patient of tier 2 copayment for generic albuterol HFA inhaler.  ? Caution regarding risk of sedation/dizziness with tramadol and hydroxyzine ? Denies dizziness or sedation with tramadol ? Denies taking hydroxyzine . Counsel on importance of continued blood sugar control and monitoring ? Reports recent CBGs ranging: 90s-120s ? Reports taking:  ? Trulicity 2.44 mg weekly ? Basaglar 14 units once daily. ? Denies any s/s low blood sugar . F/u with patient regarding acid control ? Reports taking Nexium 40 mg once to twice daily ? Counsel patient on strategies for managing acid reflux symptoms . Again enourage patient to monitor home BP/HR ? Mr. Gift denies monitoring at home. Denies need as no recent symptoms such as dizziness ? Note CM Pharmacist and RN have repeatedly encouraged patient to check BP as recommended by providers and upper arm BP monitor provided.  However, patient declines to monitor BP at home. . Coordination of care ? Encourage patient to continue to schedule follow up appointment with Cardiology ? Patient confirms planning to attend next appointment with PCP on 6/1 . Counsel on COVID-19 prevention and vaccination. [name] on importance of medication adherence. Encourage patient to use weekly pillbox . Reports following fluid and salt restrictions as directed by Nephrologist . Will collaborate with PCP to request new albuterol HFA Rx for patient and regarding patient's diabetes medication management  Patient Self Care Activities:  . Self administers medications . Calls pharmacy for medication refills . Calls provider office for new concerns or questions  . Patient to check daily blood sugars, including fasting blood sugars, and keep log . Patient UNABLE to monitor home BP  Please see past updates related to this goal by clicking on the "Past Updates" button in the selected goal  Plan  The care management team will reach out to the patient again over the next 30 days.   [name], PharmD, [place] [employer] [phone]

## 2019-06-23 NOTE — Patient Instructions (Signed)
Thank you allowing the Chronic Care Management Team to be a part of your care! It was a pleasure speaking with you today!     CCM (Chronic Care Management) Team    Noreene Larsson RN, MSN, CCM Nurse Care Coordinator  [phone]   [name] PharmD  Clinical Pharmacist  [phone]   [name] LCSW Clinical Social Worker [phone]  Visit Information  Goals Addressed            This Visit's Progress   . PharmD - Medication Management       CARE PLAN ENTRY (see longitudinal plan of care for additional care plan information)  Current Barriers:  . Financial Barriers  . Lack of blood sugar or blood pressure results for the clinical team  Pharmacist Clinical Goal(s):  [name] Kitchen Over the next 30 days, patient will work with CM Pharmacist to address needs related to medication therapy optimization  Interventions: . Perform chart review . Follow up with patient regarding sinusitis ? Reports resolved, completed Z-Pak as prescribed by PCP on 5/11 . Comprehensive medication review performed; medication and allergy lists updated in electronic medical record ? Identify patient needing new Rx: albuterol HFA inhaler. Previous Rx expired.  ? Reports had not obtained refill previously due to copayment. Review health plan formulary and advise patient of tier 2 copayment for generic albuterol HFA inhaler.  ? Caution regarding risk of sedation/dizziness with tramadol and hydroxyzine ? Denies dizziness or sedation with tramadol ? Denies taking hydroxyzine . Counsel on importance of continued blood sugar control and monitoring ? Reports recent CBGs ranging: 90s-120s ? Reports taking:  ? Trulicity 2.26 mg weekly ? Basaglar 14 units once daily. ? Denies any s/s low blood sugar . F/u with patient regarding acid control ? Reports taking Nexium 40 mg once to twice daily ? Counsel patient on strategies for managing acid reflux symptoms . Again enourage patient to monitor home  BP/HR ? Mr. Kelm denies monitoring at home. Denies need as no recent symptoms such as dizziness ? Note CM Pharmacist and RN have repeatedly encouraged patient to check BP as recommended by providers and upper arm BP monitor provided. However, patient declines to monitor BP at home. . Coordination of care ? Encourage patient to continue to schedule follow up appointment with Cardiology ? Patient confirms planning to attend next appointment with PCP on 6/1 . Counsel on COVID-19 prevention and vaccination. [name] on importance of medication adherence. Encourage patient to use weekly pillbox . Reports following fluid and salt restrictions as directed by Nephrologist . Will collaborate with PCP to request new albuterol HFA Rx for patient and regarding patient's diabetes medication management  Patient Self Care Activities:  . Self administers medications . Calls pharmacy for medication refills . Calls provider office for new concerns or questions  . Patient to check daily blood sugars, including fasting blood sugars, and keep log . Patient UNABLE to monitor home BP  Please see past updates related to this goal by clicking on the "Past Updates" button in the selected goal         Patient verbalizes understanding of instructions provided today.   The care management team will reach out to the patient again over the next 30 days.   [name], PharmD, [place] [employer] [phone]

## 2019-06-27 ENCOUNTER — Encounter: Payer: Self-pay | Admitting: Family Medicine

## 2019-06-27 ENCOUNTER — Other Ambulatory Visit: Payer: Self-pay | Admitting: Family Medicine

## 2019-06-27 ENCOUNTER — Other Ambulatory Visit: Payer: Self-pay

## 2019-06-27 ENCOUNTER — Ambulatory Visit (INDEPENDENT_AMBULATORY_CARE_PROVIDER_SITE_OTHER): Payer: PPO | Admitting: Family Medicine

## 2019-06-27 VITALS — BP 132/84 | HR 83 | Temp 97.1°F | Resp 16 | Ht 71.0 in | Wt 369.6 lb

## 2019-06-27 DIAGNOSIS — I10 Essential (primary) hypertension: Secondary | ICD-10-CM | POA: Diagnosis not present

## 2019-06-27 DIAGNOSIS — E1169 Type 2 diabetes mellitus with other specified complication: Secondary | ICD-10-CM | POA: Diagnosis not present

## 2019-06-27 DIAGNOSIS — Z6841 Body Mass Index (BMI) 40.0 and over, adult: Secondary | ICD-10-CM

## 2019-06-27 DIAGNOSIS — I129 Hypertensive chronic kidney disease with stage 1 through stage 4 chronic kidney disease, or unspecified chronic kidney disease: Secondary | ICD-10-CM

## 2019-06-27 DIAGNOSIS — Z794 Long term (current) use of insulin: Secondary | ICD-10-CM

## 2019-06-27 DIAGNOSIS — I5022 Chronic systolic (congestive) heart failure: Secondary | ICD-10-CM | POA: Diagnosis not present

## 2019-06-27 DIAGNOSIS — E1122 Type 2 diabetes mellitus with diabetic chronic kidney disease: Secondary | ICD-10-CM | POA: Diagnosis not present

## 2019-06-27 DIAGNOSIS — N2581 Secondary hyperparathyroidism of renal origin: Secondary | ICD-10-CM

## 2019-06-27 DIAGNOSIS — E785 Hyperlipidemia, unspecified: Secondary | ICD-10-CM | POA: Diagnosis not present

## 2019-06-27 DIAGNOSIS — Z1211 Encounter for screening for malignant neoplasm of colon: Secondary | ICD-10-CM

## 2019-06-27 DIAGNOSIS — N184 Chronic kidney disease, stage 4 (severe): Secondary | ICD-10-CM

## 2019-06-27 DIAGNOSIS — M1A39X0 Chronic gout due to renal impairment, multiple sites, without tophus (tophi): Secondary | ICD-10-CM

## 2019-06-27 DIAGNOSIS — Z0000 Encounter for general adult medical examination without abnormal findings: Secondary | ICD-10-CM

## 2019-06-27 DIAGNOSIS — Z125 Encounter for screening for malignant neoplasm of prostate: Secondary | ICD-10-CM

## 2019-06-27 LAB — POCT GLYCOSYLATED HEMOGLOBIN (HGB A1C): Hemoglobin A1C: 6.4 % — AB (ref 4.0–5.6)

## 2019-06-27 MED ORDER — CARVEDILOL 25 MG PO TABS: 50.0000 mg | ORAL_TABLET | Freq: Two times a day (BID) | ORAL | 1 refills | Status: DC

## 2019-06-27 MED ORDER — ONETOUCH ULTRA 2 W/DEVICE KIT: PACK | 0 refills | Status: DC

## 2019-06-27 NOTE — Assessment & Plan Note (Signed)
A1c at 6.4, overall well controlled now on GLP1. Last time 6.1, overall continues dramatic improvement Complications - CKD-IV, DM retinopathy Also complicated by hypoglycemia episodes Failed Bydureon (unable to operate pen)  Plan:  1. For now continue Trulicity 0.75mg  weekly injection - just received 4 month supply, will coordinate with [name] [facility] today CCM pharmacy to discuss timing of new rx inc dose 1.5mg  dose as planned, likely within 3-4 months. - REDUCE Basaglar from 14u down to 10u now, and can continue to gradually decrease in future once on higher dose Trulicity 2. Encourage improved lifestyle - low carb, low sugar diet, reduce portion size, continue improving regular exercise 3. Check CBG, bring log to next visit for review 4. Continue Fitzgerald, ACEi, Statin

## 2019-06-27 NOTE — Assessment & Plan Note (Signed)
Controlled cholesterol on statin improving lifestyle Mild elevated TG  Plan: 1. Continue current meds - Atorvastatin 20mg  2. Continue ASA 81mg  for primary ASCVD risk reduction 3. Encourage improved lifestyle - low carb/cholesterol, reduce portion size, continue improving regular exercise

## 2019-06-27 NOTE — Patient Instructions (Addendum)
Thank you for coming to the office today.  Recent Labs    10/05/18 0755 06/27/19 0830  HGBA1C 6.1* 6.4*    May reduce Basaglar down to 10 units for now, use your current 4 month supply, and we will order HIGHER dose at next fill. Up to 1.5 dose and then eventually can discontinue the basaglar  Colon Cancer Screening: - For all adults age 53+ routine colon cancer screening is highly recommended.     - Recent guidelines from American [employer] Society recommend starting age of 45 - Early detection of colon cancer is important, because often there are no warning signs or symptoms, also if found early usually it can be cured. Late stage is hard to treat.  - If you are not interested in Colonoscopy screening (if done and normal you could be cleared for 5 to 10 years until next due), then Cologuard is an excellent alternative for screening test for Colon Cancer. It is highly sensitive for detecting DNA of colon cancer from even the earliest stages. Also, there is NO bowel prep required. - If Cologuard is NEGATIVE, then it is good for 3 years before next due - If Cologuard is POSITIVE, then it is strongly advised to get a Colonoscopy, which allows the GI doctor to locate the source of the cancer or polyp (even very early stage) and treat it by removing it. -------------------------  ORDERED A NEW KIT TODAY  Follow instructions to collect sample, you may call the company for any help or questions, 24/7 telephone support at [phone].  DUE for FASTING BLOOD WORK (no food or drink after midnight before the lab appointment, only water or coffee without cream/sugar on the morning of)  SCHEDULE "Lab Only" visit in the morning at the clinic for lab draw in 4 MONTHS   - Make sure Lab Only appointment is at about 1 week before your next appointment, so that results will be available  For Lab Results, once available within 2-3 days of blood draw, you can can log in to MyChart online to view your  results and a brief explanation. Also, we can discuss results at next follow-up visit.   Please schedule a Follow-up Appointment to: Return in about 4 months (around 10/27/2019) for Annual Physical.  If you have any other questions or concerns, please feel free to call the office or send a message through MyChart. You may also schedule an earlier appointment if necessary.  Additionally, you may be receiving a survey about your experience at our office within a few days to 1 week by e-mail or mail. We value your feedback.   , DO [facility], [facility] 

## 2019-06-27 NOTE — Progress Notes (Signed)
Subjective:    Patient ID: [name], male    DOB: 1965-02-28, 54 y.o.   MRN: [id number]  [name] is a 54 y.o. male presenting on 06/27/2019 for Diabetes   HPI   CHRONIC DM, Type 2w/ complication CKD-IV Morbid Obesity BMI >55 Secondary renal hyperparathyroidism Hyperlipidemia Chronic Systolic CHF Followed by Dr Juleen China [facility] Nephrology, [facility] Cardiology Update since last visit reduced  basaglar to 14u He just received 4 month supply of Trulicity now here from office today for dose 0.75 Due for A1c today CBGs: Avg150, Low>100, High< 200 on average. Meds:Trulicity 6.71IW weekly inj, Basaglar 14u units - on financial assistance through manufacturer Reports good compliance. Tolerating well w/o side-effects Currently on ACEi Needs refill on his Carvedilol taking 96m x 2 = 565mBID. Lifestyle: - Diet (improving) - Exercise (limited) Denies hypoglycemia, polyuria, visual changes, numbness or tingling.  Health Maintenance: He had cologuard test ordered >1 year ago, says the cologuard kit is expired, needs new order.  [facility] 06/27/2019 05/18/2019 01/24/2019  Decreased Interest 0 0 0  Down, Depressed, Hopeless 0 0 0  PHQ - 2 Score 0 0 0    Social History   Tobacco Use  . Smoking status: Never Smoker  . Smokeless tobacco: Never Used  Substance Use Topics  . Alcohol use: Yes    Comment: a glass of wine and a couple beers a day  . Drug use: No    Review of Systems Per HPI unless specifically indicated above     Objective:    BP 132/84   Pulse 83   Temp (!) 97.1 F (36.2 C) (Temporal)   Resp 16   Ht '5\' 11"'  (1.803 m)   Wt (!) 369 lb 9.6 oz (167.6 kg)   SpO2 92%   BMI 51.55 kg/m   Wt Readings from Last 3 Encounters:  06/27/19 (!) 369 lb 9.6 oz (167.6 kg)  01/24/19 (!) 335 lb (152 kg)  03/08/18 (!) 383 lb 8 oz (174 kg)    Physical Exam Vitals and nursing note reviewed.  Constitutional:      General: He is not in acute  distress.    Appearance: He is well-developed. He is obese. He is not diaphoretic.     Comments: Well-appearing, comfortable, cooperative  HENT:     Head: Normocephalic and atraumatic.  Eyes:     General:        Right eye: No discharge.        Left eye: No discharge.     Conjunctiva/sclera: Conjunctivae normal.     Pupils: Pupils are equal, round, and reactive to light.  Neck:     Thyroid: No thyromegaly.  Cardiovascular:     Rate and Rhythm: Normal rate and regular rhythm.     Heart sounds: Normal heart sounds. No murmur.  Pulmonary:     Effort: Pulmonary effort is normal. No respiratory distress.     Breath sounds: Normal breath sounds. No wheezing or rales.  Abdominal:     General: Bowel sounds are normal. There is no distension.     Palpations: Abdomen is soft. There is no mass.     Tenderness: There is no abdominal tenderness.  Musculoskeletal:        General: No tenderness. Normal range of motion.     Cervical back: Normal range of motion and neck supple.     Right lower leg: Edema (+1 pitting) present.     Left lower leg: Edema (+  1 pitting, has compression on) present.     Comments: Upper / Lower Extremities: - Normal muscle tone, strength bilateral upper extremities 5/5, lower extremities 5/5  Lymphadenopathy:     Cervical: No cervical adenopathy.  Skin:    General: Skin is warm and dry.     Findings: No erythema or rash.  Neurological:     Mental Status: He is alert and oriented to person, place, and time.     Comments: Distal sensation intact to light touch all extremities  Psychiatric:        Behavior: Behavior normal.     Comments: Well groomed, good eye contact, normal speech and thoughts      Diabetic Foot Exam - Simple   Simple Foot Form Diabetic Foot exam was performed with the following findings: Yes 06/27/2019  8:26 AM  Visual Inspection See comments: Yes Sensation Testing Intact to touch and monofilament testing bilaterally: Yes Pulse  Check Posterior Tibialis and Dorsalis pulse intact bilaterally: Yes Comments Early callus formation, dry skin. No ulceration.      Results for orders placed or performed in visit on 06/27/19  POCT HgB A1C  Result Value Ref Range   Hemoglobin A1C 6.4 (A) 4.0 - 5.6 %      Assessment & Plan:   Problem List Items Addressed This Visit    Type 2 diabetes mellitus, controlled, with renal complications ([place]) - Primary    A1c at 6.4, overall well controlled now on GLP1. Last time 6.1, overall continues dramatic improvement Complications - CKD-IV, DM retinopathy Also complicated by hypoglycemia episodes Failed Bydureon (unable to operate pen)  Plan:  1. For now continue Trulicity 6.80SU weekly injection - just received 4 month supply, will coordinate with [name] [facility] today CCM pharmacy to discuss timing of new rx inc dose 1.47m dose as planned, likely within 3-4 months. - REDUCE Basaglar from 14u down to 10u now, and can continue to gradually decrease in future once on higher dose Trulicity 2. Encourage improved lifestyle - low carb, low sugar diet, reduce portion size, continue improving regular exercise 3. Check CBG, bring log to next visit for review 4. Continue ASA, ACEi, Statin      Relevant Medications   Blood Glucose Monitoring Suppl (ONE TOUCH ULTRA 2) w/Device KIT   Other Relevant Orders   POCT HgB A1C (Completed)   [place]    Followed by nephrology Secondary to [place] with BMI of 50.0-59.9, adult (HCC)    Weight up to 369 lbs Encourage lifestyle diet exercise GLP1 therapy      Hyperlipidemia associated with type 2 diabetes mellitus ([place]    Controlled cholesterol on statin improving lifestyle Mild elevated TG  Plan: 1. Continue current meds - Atorvastatin 248m2. Continue ASA 8173mor primary ASCVD risk reduction 3. Encourage improved lifestyle - low carb/cholesterol, reduce portion size, continue  improving regular exercise      CKD (chronic kidney disease), stage IV (HCC)    Stable CKD-IV Followed by [facility] Dr KolJuleen Chinamains on allopurinol lower dosage for gout prophylaxis On low dose ACEi [facility] Continue improved hydration      Chronic systolic heart failure (HCC)    Stable, euvolemic at baseline on compression stockings Currently well compensated, chronic systolic CHF secondary to non-ischemic cardiomyopathy [facility] On med management low dose ACEi Lisinopril 2.5mg64marvedilol, Bumex, metazalone Has declined ICD in past  Relevant Medications   metolazone (ZAROXOLYN) 5 MG tablet   carvedilol (COREG) 25 MG tablet   Benign hypertension with CKD (chronic kidney disease) stage IV (HCC)    Chronic HTN, controlled Secondary complication CKD-IV Followed by [facility] Dr Juleen China, [facility] On ACEi low dose BB diuretics - refill Carvedilol      Relevant Medications   metolazone (ZAROXOLYN) 5 MG tablet   carvedilol (COREG) 25 MG tablet    Other Visit Diagnoses    Benign hypertension       Relevant Medications   metolazone (ZAROXOLYN) 5 MG tablet   carvedilol (COREG) 25 MG tablet   Screening for colon cancer       Relevant Orders   Cologuard      Re ordered Cologuard, his last kit expired  Message Grayland Ormond, he just picked up 0.75 dose x 4 months, we can re order for next apt at higher dose 1.80m in future when ready. Will need updated through is PAP application  Meds ordered this encounter  Medications  . Blood Glucose Monitoring Suppl (ONE TOUCH ULTRA 2) w/Device KIT    Sig: Use to check blood glucose up to 3 x daily    Dispense:  1 kit    Refill:  0    OneTouch Ultra 2  . carvedilol (COREG) 25 MG tablet    Sig: Take 2 tablets (50 mg total) by mouth 2 (two) times daily with a meal.    Dispense:  360 tablet    Refill:  1    90 day supply      Follow up plan: Return in about 4 months (around 10/27/2019) for  Annual Physical.  Future labs ordered for 11/06/19   ANobie Putnam [employer] 06/27/2019, 8:17 AM

## 2019-06-27 NOTE — Assessment & Plan Note (Addendum)
Stable, euvolemic at baseline on compression stockings Currently well compensated, chronic systolic CHF secondary to non-ischemic cardiomyopathy Followed by [facility] Cardiology CHF clinic On med management low dose ACEi Lisinopril 2.5mg , Carvedilol, Bumex, metazalone Has declined ICD in past

## 2019-06-27 NOTE — Assessment & Plan Note (Signed)
Followed by nephrology Secondary to CKDIV

## 2019-06-27 NOTE — Assessment & Plan Note (Signed)
Stable CKD-IV Followed by [facility] Dr Juleen China Remains on allopurinol lower dosage for gout prophylaxis On low dose ACEi On diuretics per [facility] CHF Continue improved hydration

## 2019-06-27 NOTE — Assessment & Plan Note (Signed)
Weight up to 369 lbs Encourage lifestyle diet exercise GLP1 therapy

## 2019-06-27 NOTE — Assessment & Plan Note (Signed)
Chronic HTN, controlled Secondary complication CKD-IV Followed by [facility] Dr Juleen China, Cardilogy On ACEi low dose BB diuretics - refill Carvedilol

## 2019-07-11 ENCOUNTER — Ambulatory Visit: Payer: Self-pay

## 2019-07-11 NOTE — Chronic Care Management (AMB) (Signed)
  Care Management   Follow Up Note   07/11/2019 Name: [name] MRN: [id number] DOB: June 28, 1965  Referred by: [name], DO Reason for referral : Care Coordination   [name] is a 54 y.o. year old male who is a primary care patient of [name], DO. The care management team was consulted for assistance with care management and care coordination needs.    Review of patient status, including review of consultants reports, relevant laboratory and other test results, and collaboration with appropriate care team members and the patient's provider was performed as part of comprehensive patient evaluation and provision of chronic care management services.    LCSW completed CCM outreach attempt today but was unable to reach patient successfully. A HIPPA compliant voice message was left encouraging patient to return call once available. LCSW rescheduled CCM SW appointment as well.  A HIPPA compliant phone message was left for the patient providing contact information and requesting a return call.   [name], BSW, MSW, [email or web address] Phone: [phone]

## 2019-07-20 ENCOUNTER — Other Ambulatory Visit: Payer: Self-pay | Admitting: Family Medicine

## 2019-07-20 DIAGNOSIS — K219 Gastro-esophageal reflux disease without esophagitis: Secondary | ICD-10-CM

## 2019-07-24 ENCOUNTER — Telehealth: Payer: Self-pay

## 2019-07-24 ENCOUNTER — Ambulatory Visit: Payer: Self-pay | Admitting: General Practice

## 2019-07-24 NOTE — Chronic Care Management (AMB) (Signed)
  Chronic Care Management   Outreach Note  07/24/2019 Name: [name] MRN: [id number] DOB: 10/25/1965  Referred by: [name], DO Reason for referral : Chronic Care Management (Follow up: RNCM- Chronic Disease Management and Care Coordiination Needs)   An unsuccessful telephone outreach was attempted today. The patient was referred to the case management team for assistance with care management and care coordination.   Follow Up Plan: A HIPPA compliant phone message was left for the patient providing contact information and requesting a return call.   Noreene Larsson RN, MSN, [place] Mobile: [phone]

## 2019-07-28 ENCOUNTER — Ambulatory Visit: Payer: Self-pay | Admitting: Pharmacist

## 2019-07-28 ENCOUNTER — Other Ambulatory Visit: Payer: Self-pay | Admitting: Family Medicine

## 2019-07-28 ENCOUNTER — Telehealth: Payer: Self-pay | Admitting: Family Medicine

## 2019-07-28 DIAGNOSIS — K219 Gastro-esophageal reflux disease without esophagitis: Secondary | ICD-10-CM

## 2019-07-28 DIAGNOSIS — G894 Chronic pain syndrome: Secondary | ICD-10-CM

## 2019-07-28 DIAGNOSIS — M159 Polyosteoarthritis, unspecified: Secondary | ICD-10-CM

## 2019-07-28 MED ORDER — ESOMEPRAZOLE MAGNESIUM 40 MG PO CPDR: 40.0000 mg | DELAYED_RELEASE_CAPSULE | Freq: Every day | ORAL | 1 refills | Status: DC

## 2019-07-28 NOTE — Telephone Encounter (Signed)
Patient requesting new prescription for Esomeprazole. Spoke with Arby Barrette at [facility]. Patient came in to pharmacy today and stated he could not afford a Nexium 40 mg prescription because the cost was $180. Patient requesting a prescription for the generic form Esomeprazole. Verified with pharmacy that the cost for Esomeprazole #180 would be $30.

## 2019-07-28 NOTE — Telephone Encounter (Signed)
Patient states he requested with pharmacy a few days  traMADol (ULTRAM) 50 MG tablet and would like Rx sent in today

## 2019-07-28 NOTE — Telephone Encounter (Signed)
Patient requesting esomeprazole, informed patient please allow 48 to 72 hour turn around time, patient states he contacted the pharmacy 3 days ago and no response and would like request sent in today   [place], [place] Phone:  [phone]  Fax:  [phone]

## 2019-07-28 NOTE — Chronic Care Management (AMB) (Signed)
  Chronic Care Management   Follow Up Note   07/28/2019 Name: [name] MRN: [id number] DOB: January 18, 1966  Referred by: [name], DO Reason for referral : Chronic Care Management (Patient Phone Call)   [name] is a 54 y.o. year old male who is a primary care patient of [name], DO. The CCM team was consulted for assistance with chronic disease management and care coordination needs.    Receive a voicemail from [name] requesting a call back.  Was unable to reach patient via telephone today and have left HIPAA compliant voicemail asking patient to return my call.   Plan  The care management team will reach out to the patient again over the next 30 days.   [name], PharmD, [place] [employer] [phone]

## 2019-07-28 NOTE — Telephone Encounter (Signed)
Sent rx to [facility] esomeprazole 40mg  daily before breakfast generic 90 pills  [name], DO [facility] 07/28/2019, 4:37 PM

## 2019-08-03 ENCOUNTER — Other Ambulatory Visit: Payer: Self-pay | Admitting: Family Medicine

## 2019-08-03 DIAGNOSIS — E1169 Type 2 diabetes mellitus with other specified complication: Secondary | ICD-10-CM

## 2019-08-03 DIAGNOSIS — E785 Hyperlipidemia, unspecified: Secondary | ICD-10-CM

## 2019-08-03 NOTE — Telephone Encounter (Signed)
Requested Prescriptions  Pending Prescriptions Disp Refills   atorvastatin (LIPITOR) 20 MG tablet [Pharmacy Med Name: ATORVASTATIN 20 MG TABLET] 90 tablet 0    Sig: Take 1 tablet (20 mg total) by mouth daily.     Cardiovascular:  Antilipid - Statins Failed - 08/03/2019  2:23 PM      Failed - Triglycerides in normal range and within 360 days    Triglycerides  Date Value Ref Range Status  10/05/2018 150 (H) <150 mg/dL Final   Triglycerides Piccolo,Waived  Date Value Ref Range Status  08/07/2014 159 (H) <150 mg/dL Final    Comment:                            Normal                   <150                         Borderline High     150 - 199                         High                200 - 499                         Very High                >499          Passed - Total Cholesterol in normal range and within 360 days    Cholesterol, Total  Date Value Ref Range Status  01/17/2015 105 100 - 199 mg/dL Final   Cholesterol  Date Value Ref Range Status  10/05/2018 89 <200 mg/dL Final   Cholesterol Piccolo, Waived  Date Value Ref Range Status  08/07/2014 110 <200 mg/dL Final    Comment:                            Desirable                <200                         Borderline High      200- 239                         High                     >239          Passed - LDL in normal range and within 360 days    LDL Cholesterol (Calc)  Date Value Ref Range Status  10/05/2018 16 mg/dL (calc) Final    Comment:    Reference range: <100 . Desirable range <100 mg/dL for primary prevention;   <70 mg/dL for patients with CHD or diabetic patients  with > or = 2 CHD risk factors. [name] Kitchen LDL-C is now calculated using the Martin-Hopkins  calculation, which is a validated novel method providing  better accuracy than the Friedewald equation in the  estimation of LDL-C.  Cresenciano Genre et al. Annamaria Helling. 7902;409(73): 2061-2068  (http://education.[employer].com/faq/FAQ164)          Passed -  HDL in normal range and  within 360 days    HDL  Date Value Ref Range Status  10/05/2018 50 > OR = 40 mg/dL Final  01/17/2015 48 >39 mg/dL Final         Passed - Patient is not pregnant      Passed - Valid encounter within last 12 months    Recent Outpatient Visits          1 month ago Controlled type 2 diabetes mellitus with stage 4 chronic kidney disease, with long-term current use of insulin ([place])   [facility] [place], [name], DO   1 month ago Acute non-recurrent frontal sinusitis   [place], DO   7 months ago Controlled type 2 diabetes mellitus with stage 4 chronic kidney disease, with long-term current use of insulin ([place])   [facility] [place], [name], DO   1 year ago Benign hypertension with CKD (chronic kidney disease) stage IV [facility])   [facility] [name], DO   1 year ago Acute renal failure superimposed on stage 4 chronic kidney disease, unspecified acute renal failure type [facility])   [facility] [place], [name], DO      Future Appointments            In 3 months [place], [name], DO [facility], [facility]

## 2019-08-07 ENCOUNTER — Ambulatory Visit: Payer: Self-pay | Admitting: Pharmacist

## 2019-08-07 ENCOUNTER — Telehealth: Payer: Self-pay

## 2019-08-07 NOTE — Chronic Care Management (AMB) (Signed)
°  Chronic Care Management   Follow Up Note   08/07/2019 Name: [name] MRN: [id number] DOB: 03/29/1965  Referred by: [name], DO Reason for referral : Chronic Care Management (Patient Phone Call)   [name] is a 54 y.o. year old male who is a primary care patient of [name], DO. The CCM team was consulted for assistance with chronic disease management and care coordination needs.    Was unable to reach patient via telephone today and have left HIPAA compliant voicemail asking patient to return my call.    Plan  The care management team will reach out to the patient again over the next 30 days.   [name], PharmD, [place] [employer] [phone]

## 2019-08-18 ENCOUNTER — Other Ambulatory Visit: Payer: Self-pay | Admitting: Family Medicine

## 2019-08-18 DIAGNOSIS — I129 Hypertensive chronic kidney disease with stage 1 through stage 4 chronic kidney disease, or unspecified chronic kidney disease: Secondary | ICD-10-CM

## 2019-08-18 DIAGNOSIS — N184 Chronic kidney disease, stage 4 (severe): Secondary | ICD-10-CM

## 2019-08-18 NOTE — Telephone Encounter (Signed)
Requested medication (s) are due for refill today: yes  Requested medication (s) are on the active medication list:yes  Last refill: 06/08/18 #90  3 refills  Future visit scheduled: yes  Notes to clinic:  Labs due Last Cr and K 10/05/18    Requested Prescriptions  Pending Prescriptions Disp Refills   lisinopril (ZESTRIL) 2.5 MG tablet [Pharmacy Med Name: LISINOPRIL 2.5 MG TABLET] 90 tablet 0    Sig: Take 1 tablet (2.5 mg total) by mouth daily.      Cardiovascular:  ACE Inhibitors Failed - 08/18/2019  2:33 PM      Failed - Cr in normal range and within 180 days    Creat  Date Value Ref Range Status  10/05/2018 1.83 (H) 0.70 - 1.33 mg/dL Final    Comment:    For patients >28 years of age, the reference limit for Creatinine is approximately 13% higher for people identified as African-American. .    Creatinine, POC  Date Value Ref Range Status  05/07/2015 0 mg/dL Final          Failed - K in normal range and within 180 days    Potassium  Date Value Ref Range Status  10/05/2018 4.2 3.5 - 5.3 mmol/L Final          Passed - Patient is not pregnant      Passed - Last BP in normal range    BP Readings from Last 1 Encounters:  06/27/19 132/84          Passed - Valid encounter within last 6 months    Recent Outpatient Visits           1 month ago Controlled type 2 diabetes mellitus with stage 4 chronic kidney disease, with long-term current use of insulin ([place])   [facility] [place], [name], DO   2 months ago Acute non-recurrent frontal sinusitis   [place], DO   7 months ago Controlled type 2 diabetes mellitus with stage 4 chronic kidney disease, with long-term current use of insulin ([place])   [facility] [place], [name], DO   1 year ago Benign hypertension with CKD (chronic kidney disease) stage IV [facility])   [facility] [name], DO   1 year ago  Acute renal failure superimposed on stage 4 chronic kidney disease, unspecified acute renal failure type [facility])   [facility] [place], [name], DO       Future Appointments             In 2 months [place], [name], DO [facility], [facility]

## 2019-08-21 ENCOUNTER — Telehealth: Payer: Self-pay

## 2019-08-21 ENCOUNTER — Telehealth: Payer: Self-pay | Admitting: General Practice

## 2019-08-21 NOTE — Telephone Encounter (Signed)
  Chronic Care Management   Outreach Note  08/21/2019 Name: [name] MRN: [id number] DOB: [birth year]  Referred by: [name], DO Reason for referral : Appointment (2nd attempt RNCM Chronic Disease Managment and Care Coordination Needs )   A second unsuccessful telephone outreach was attempted today. The patient was referred to the case management team for assistance with care management and care coordination.   Follow Up Plan: A HIPPA compliant phone message was left for the patient providing contact information and requesting a return call.   Noreene Larsson RN, MSN, [place] Mobile: [phone]

## 2019-08-22 ENCOUNTER — Telehealth: Payer: Self-pay

## 2019-08-22 NOTE — Chronic Care Management (AMB) (Signed)
  Care Management   Note  08/22/2019 Name: [name] MRN: [id number] DOB: [birth year]  [name] is a 54 y.o. year old male who is a primary care patient of [name], DO and is actively engaged with the care management team. I reached out to [name] by phone today to assist with re-scheduling a follow up visit with the RN Case Manager  Follow up plan: Unsuccessful telephone outreach attempt made. A HIPPA compliant phone message was left for the patient providing contact information and requesting a return call.  The care management team will reach out to the patient again over the next 7 days.  If patient returns call to provider office, please advise to call [place]  at [place], [place], [place], [place] [address] Direct Dial: [phone] [email or web address]: [email or web address]

## 2019-08-22 NOTE — Chronic Care Management (AMB) (Signed)
  Care Management   Note  08/22/2019 Name: [name] MRN: [id number] DOB: Dec 11, 1965  [name] is a 54 y.o. year old male who is a primary care patient of [name], DO and is actively engaged with the care management team. I reached out to [name] by phone today to assist with re-scheduling a follow up visit with the RN Case Manager  Follow up plan: Telephone appointment with care management team member scheduled for:09/18/2019  Noreene Larsson, [place], [place], [place] [address] Direct Dial: [phone] [email or web address] Website: [email or web address]

## 2019-08-24 ENCOUNTER — Telehealth: Payer: Self-pay

## 2019-08-24 NOTE — Telephone Encounter (Signed)
Attempted to contact the patient to notify him that his medication is available for pick up.

## 2019-08-25 NOTE — Telephone Encounter (Signed)
Left vm. for pt to call office for a message.

## 2019-08-30 ENCOUNTER — Telehealth: Payer: Self-pay

## 2019-09-04 ENCOUNTER — Telehealth: Payer: Self-pay

## 2019-09-04 ENCOUNTER — Telehealth: Payer: Self-pay | Admitting: Pharmacist

## 2019-09-04 NOTE — Telephone Encounter (Signed)
  Chronic Care Management   Outreach Note  09/04/2019 Name: ELIASAR HLAVATY MRN: [id number] DOB: Sep 22, 1965  Referred by: [name], DO Reason for referral : No chief complaint on file.   Was unable to reach patient via telephone today and have left HIPAA compliant voicemail asking patient to return my call.    Follow Up Plan: Will collaborate with Care Guide to outreach to schedule follow up with me  [name], PharmD, [place] Management [phone]

## 2019-09-05 ENCOUNTER — Telehealth: Payer: Self-pay

## 2019-09-05 NOTE — Chronic Care Management (AMB) (Signed)
  Care Management   Note  09/05/2019 Name: SHEDRIC FREDERICKS MRN: [id number] DOB: May 19, 1965  Okey Regal Diveley is a 54 y.o. year old male who is a primary care patient of [name], DO and is actively engaged with the care management team. I reached out to [name] by phone today to assist with re-scheduling a follow up visit with the Pharmacist  Follow up plan: Unsuccessful telephone outreach attempt made. A HIPPA compliant phone message was left for the patient providing contact information and requesting a return call.  The care management team will reach out to the patient again over the next 7 days.  If patient returns call to provider office, please advise to call [place]  at [place], [place], [place], [place] [address] Direct Dial: [phone] [email or web address]

## 2019-09-06 ENCOUNTER — Ambulatory Visit (INDEPENDENT_AMBULATORY_CARE_PROVIDER_SITE_OTHER): Payer: PPO | Admitting: Pharmacist

## 2019-09-06 DIAGNOSIS — E1122 Type 2 diabetes mellitus with diabetic chronic kidney disease: Secondary | ICD-10-CM

## 2019-09-06 DIAGNOSIS — N184 Chronic kidney disease, stage 4 (severe): Secondary | ICD-10-CM

## 2019-09-06 NOTE — Patient Instructions (Addendum)
Thank you allowing the Chronic Care Management Team to be a part of your care! It was a pleasure speaking with you today!     CCM (Chronic Care Management) Team    Noreene Larsson RN, MSN, CCM Nurse Care Coordinator  [phone]   [name] PharmD  Clinical Pharmacist  [phone]   [name] LCSW Clinical Social Worker [phone]  Visit Information  Goals Addressed            This Visit's Progress   . PharmD - Medication Management       CARE PLAN ENTRY (see longitudinal plan of care for additional care plan information)  Current Barriers:  . Financial Barriers  . Lack of blood sugar or blood pressure results for the clinical team  Pharmacist Clinical Goal(s):  [name] Kitchen Over the next 30 days, patient will work with CM Pharmacist to address needs related to medication therapy optimization  Interventions: . Collaborated with PCP. Patient seen by provider on 6/1.  ? Patient advised to continue Trulicity 0.75mg  weekly (had just picked up supply of medication) and to REDUCE Basaglar dose to 10 units daily ? With future plan (once current supply of Trulicity 1.63 mg used up) to increase Trulicity dose to 1.5 mg weekly and gradually reduce dose of Basaglar at that time  . Counsel patient on importance of continued blood sugar control and monitoring ? Reports recent CBGs ranging: 120s-130s ? Reports taking:  ? Trulicity 8.45 mg weekly ? Basaglar 10 units once daily. ? Patient reports having at least 1 month supply of Trulicity remaining at home ? From review of chart, note patient also has medication from assistance program waiting in office - per Nisha, suppiles of both Trulicity 0.75mg  and Engineer, agricultural received ? Patient states enrolled in automatic refill ? Counsel patient to pick up assistance program medications from office ? Encourage patient to contact [place] assistance program to have Trulicity removed from automatic refill as plan is for dose change . Again  enourage patient to monitor home BP/HR ? Mr. Fjeld denies monitoring at home. Denies need as no recent symptoms such as dizziness ? Note CM Pharmacist and RN have repeatedly encouraged patient to check BP as recommended by providers and upper arm BP monitor provided. However, patient declines to monitor BP at home. . Coordination of care ? Again encourage patient to continue to schedule follow up appointment with Cardiology . Counsel on importance of medication adherence.  [name] with PCP. Plan for patient to continue current dose of Trulicity (3.64 mg weekly) until current supply used up.  Patient Self Care Activities:  . Self administers medications . Calls pharmacy for medication refills . Calls provider office for new concerns or questions  . Patient to check daily blood sugars, including fasting blood sugars, and keep log . Patient UNABLE to monitor home BP  Please see past updates related to this goal by clicking on the "Past Updates" button in the selected goal         Patient verbalizes understanding of instructions provided today.   Telephone follow up appointment with care management team member scheduled for: 10/6 at 10 am  [name], PharmD, [place] [phone]

## 2019-09-06 NOTE — Chronic Care Management (AMB) (Addendum)
Chronic Care Management   Follow Up Note   09/06/2019 Name: ESA RADEN MRN: [id number] DOB: January 25, 1966  Referred by: [name], DO Reason for referral : Chronic Care Management (Patient Phone Call)   [name] is a 54 y.o. year old male who is a primary care patient of [name], DO. The CCM team was consulted for assistance with chronic disease management and care coordination needs.    Receive a voicemail from patient requesting a call back.  I reached out to [name] by phone today.   Review of patient status, including review of consultants reports, relevant laboratory and other test results, and collaboration with appropriate care team members and the patient's provider was performed as part of comprehensive patient evaluation and provision of chronic care management services.     Outpatient Encounter Medications as of 09/06/2019  Medication Sig Note  . Dulaglutide (TRULICITY) 5.95 GL/8.7FI SOPN Inject 0.75 mg into the skin once a week. 07/20/2018: Taking on Fridays  . Insulin Glargine (BASAGLAR KWIKPEN) 100 UNIT/ML SOPN Inject 10 Units into the skin daily. Submitted to [employer] for assistance 09/06/2019: Reports taking 10 units each morning  . albuterol (PROVENTIL) (2.5 MG/3ML) 0.083% nebulizer solution Take 3 mLs (2.5 mg total) by nebulization every 6 (six) hours as needed for wheezing or shortness of breath.   [name] Kitchen albuterol (VENTOLIN HFA) 108 (90 Base) MCG/ACT inhaler Inhale 2 puffs into the lungs every 4 (four) hours as needed for wheezing or shortness of breath.   . allopurinol (ZYLOPRIM) 100 MG tablet Take 2 tablets (200 mg total) by mouth daily.   [name] Kitchen aspirin 81 MG chewable tablet Chew 81 mg by mouth daily.   [name] Kitchen atorvastatin (LIPITOR) 20 MG tablet Take 1 tablet (20 mg total) by mouth daily.   . blood glucose meter kit and supplies KIT Brand of choice; LON 99 months; check FSBS 3x a day; E11.65; disp one meter with 100 strips + 1  refill of strips   . Blood Glucose Monitoring Suppl (ONE TOUCH ULTRA 2) w/Device KIT Use to check blood glucose up to 3 x daily   . bumetanide (BUMEX) 1 MG tablet Take 1 mg by mouth 2 (two) times daily. Per Dr. Juleen China 10/06/2018: Taking 2 mg (2 tablets) each morning and 1 mg (1 tablet) each afternoon (as last prescribed by Cardiology)  . calcitRIOL (ROCALTROL) 0.5 MCG capsule Take 0.5 mcg by mouth daily.   . carvedilol (COREG) 25 MG tablet Take 2 tablets (50 mg total) by mouth 2 (two) times daily with a meal.   . CLARITIN 10 MG tablet Take 1 tablet (10 mg total) by mouth daily. One po daily x 5 days   . esomeprazole (NEXIUM) 40 MG capsule Take 1 capsule (40 mg total) by mouth daily before breakfast.   . hydrOXYzine (VISTARIL) 25 MG capsule Take 1 capsule (25 mg total) by mouth every 8 (eight) hours as needed for itching.   . Insulin Pen Needle (B-D UF III MINI PEN NEEDLES) 31G X 5 MM MISC 1 each by Does not apply route daily. Use once daily with Victoza.   . Lancets (ONETOUCH ULTRASOFT) lancets Check blood glucose up to 3 x daily   . lisinopril (ZESTRIL) 2.5 MG tablet Take 1 tablet (2.5 mg total) by mouth daily.   . metolazone (ZAROXOLYN) 5 MG tablet Take by mouth.   . ONE TOUCH ULTRA TEST test strip Check sugar 3 x daily   . simethicone (MYLICON) 433  MG chewable tablet Chew 125 mg by mouth every 6 (six) hours as needed for flatulence.   . traMADol (ULTRAM) 50 MG tablet Take 1-2 tablets (50-100 mg total) by mouth every 12 (twelve) hours as needed.   . vitamin C (ASCORBIC ACID) 500 MG tablet Take 1,000 mg by mouth daily.    No facility-administered encounter medications on file as of 09/06/2019.    Goals Addressed            This Visit's Progress   . PharmD - Medication Management       CARE PLAN ENTRY (see longitudinal plan of care for additional care plan information)  Current Barriers:  . Financial Barriers  . Lack of blood sugar or blood pressure results for the clinical  team  Pharmacist Clinical Goal(s):  [name] Kitchen Over the next 30 days, patient will work with CM Pharmacist to address needs related to medication therapy optimization  Interventions: . Collaborated with PCP. Patient seen by provider on 6/1.  ? Patient advised to continue Trulicity 4.03KV weekly (had just picked up supply of medication) and to REDUCE Basaglar dose to 10 units daily ? With future plan (once current supply of Trulicity 4.25 mg used up) to increase Trulicity dose to 1.5 mg weekly and gradually reduce dose of Basaglar at that time  . Counsel patient on importance of continued blood sugar control and monitoring ? Reports recent CBGs ranging: 120s-130s ? Reports taking:  ? Trulicity 9.56 mg weekly ? Basaglar 10 units once daily. ? Patient reports having at least 1 month supply of Trulicity remaining at home ? From review of chart, note patient also has medication from assistance program waiting in office - per Nisha, suppiles of both Trulicity 3.87FI and Basaglar received ? Patient states enrolled in automatic refill ? Counsel patient to pick up assistance program medications from office ? Encourage patient to contact [place] assistance program to have Trulicity removed from automatic refill as plan is for dose change . Again enourage patient to monitor home BP/HR ? Mr. Tamer denies monitoring at home. Denies need as no recent symptoms such as dizziness ? Note CM Pharmacist and RN have repeatedly encouraged patient to check BP as recommended by providers and upper arm BP monitor provided. However, patient declines to monitor BP at home. . Coordination of care ? Again encourage patient to continue to schedule follow up appointment with Cardiology . Counsel on importance of medication adherence.  [name] with PCP. Plan for patient to continue current dose of Trulicity (4.33 mg weekly) until current supply used up.  Patient Self Care Activities:  . Self administers  medications . Calls pharmacy for medication refills . Calls provider office for new concerns or questions  . Patient to check daily blood sugars, including fasting blood sugars, and keep log . Patient UNABLE to monitor home BP  Please see past updates related to this goal by clicking on the "Past Updates" button in the selected goal         Plan  Telephone follow up appointment with care management team member scheduled for: 10/6 at 10 am  [name], PharmD, [place] [phone]

## 2019-09-11 NOTE — Telephone Encounter (Signed)
You have r/s appt for 11/01/2019

## 2019-09-12 NOTE — Chronic Care Management (AMB) (Signed)
  Care Management   Note  09/12/2019 Name: [name] MRN: [id number] DOB: 1965/12/11  [name] is a 54 y.o. year old male who is a primary care patient of [name], DO and is actively engaged with the care management team. I reached out to [name] by phone today to assist with re-scheduling a follow up visit with the Pharmacist  Follow up plan: Telephone appointment with care management team member scheduled for:11/01/2019  Noreene Larsson, [place], [employer]  [place], [place] [address] Direct Dial: [phone] [email or web address] Website: [email or web address]

## 2019-09-14 ENCOUNTER — Ambulatory Visit: Payer: Self-pay | Admitting: Licensed Clinical Social Worker

## 2019-09-14 DIAGNOSIS — N184 Chronic kidney disease, stage 4 (severe): Secondary | ICD-10-CM

## 2019-09-14 DIAGNOSIS — I129 Hypertensive chronic kidney disease with stage 1 through stage 4 chronic kidney disease, or unspecified chronic kidney disease: Secondary | ICD-10-CM

## 2019-09-14 DIAGNOSIS — E1122 Type 2 diabetes mellitus with diabetic chronic kidney disease: Secondary | ICD-10-CM

## 2019-09-14 DIAGNOSIS — E785 Hyperlipidemia, unspecified: Secondary | ICD-10-CM

## 2019-09-14 DIAGNOSIS — E1169 Type 2 diabetes mellitus with other specified complication: Secondary | ICD-10-CM

## 2019-09-14 DIAGNOSIS — Z794 Long term (current) use of insulin: Secondary | ICD-10-CM | POA: Diagnosis not present

## 2019-09-14 NOTE — Chronic Care Management (AMB) (Signed)
Chronic Care Management    Clinical Social Work Follow Up Note  09/14/2019 Name: [name] MRN: [id number] DOB: August 07, 1965  [name] is a 54 y.o. year old male who is a primary care patient of [name], DO. The CCM team was consulted for assistance with [employer] .   Review of patient status, including review of consultants reports, other relevant assessments, and collaboration with appropriate care team members and the patient's provider was performed as part of comprehensive patient evaluation and provision of chronic care management services.    SDOH (Social Determinants of Health) assessments performed: Yes    Outpatient Encounter Medications as of 09/14/2019  Medication Sig Note  . albuterol (PROVENTIL) (2.5 MG/3ML) 0.083% nebulizer solution Take 3 mLs (2.5 mg total) by nebulization every 6 (six) hours as needed for wheezing or shortness of breath.   [name] Kitchen albuterol (VENTOLIN HFA) 108 (90 Base) MCG/ACT inhaler Inhale 2 puffs into the lungs every 4 (four) hours as needed for wheezing or shortness of breath.   . allopurinol (ZYLOPRIM) 100 MG tablet Take 2 tablets (200 mg total) by mouth daily.   [name] Kitchen aspirin 81 MG chewable tablet Chew 81 mg by mouth daily.   [name] Kitchen atorvastatin (LIPITOR) 20 MG tablet Take 1 tablet (20 mg total) by mouth daily.   . blood glucose meter kit and supplies KIT Brand of choice; LON 99 months; check FSBS 3x a day; E11.65; disp one meter with 100 strips + 1 refill of strips   . Blood Glucose Monitoring Suppl (ONE TOUCH ULTRA 2) w/Device KIT Use to check blood glucose up to 3 x daily   . bumetanide (BUMEX) 1 MG tablet Take 1 mg by mouth 2 (two) times daily. Per Dr. Juleen China 10/06/2018: Taking 2 mg (2 tablets) each morning and 1 mg (1 tablet) each afternoon (as last prescribed by Cardiology)  . calcitRIOL (ROCALTROL) 0.5 MCG capsule Take 0.5 mcg by mouth daily.   . carvedilol (COREG) 25 MG tablet Take 2 tablets (50 mg total) by mouth 2  (two) times daily with a meal.   . CLARITIN 10 MG tablet Take 1 tablet (10 mg total) by mouth daily. One po daily x 5 days   . Dulaglutide (TRULICITY) 3.66 YQ/0.3KV SOPN Inject 0.75 mg into the skin once a week. 07/20/2018: Taking on Fridays  . esomeprazole (NEXIUM) 40 MG capsule Take 1 capsule (40 mg total) by mouth daily before breakfast.   . hydrOXYzine (VISTARIL) 25 MG capsule Take 1 capsule (25 mg total) by mouth every 8 (eight) hours as needed for itching.   . Insulin Glargine (BASAGLAR KWIKPEN) 100 UNIT/ML SOPN Inject 10 Units into the skin daily. Submitted to [employer] for assistance 09/06/2019: Reports taking 10 units each morning  . Insulin Pen Needle (B-D UF III MINI PEN NEEDLES) 31G X 5 MM MISC 1 each by Does not apply route daily. Use once daily with Victoza.   . Lancets (ONETOUCH ULTRASOFT) lancets Check blood glucose up to 3 x daily   . lisinopril (ZESTRIL) 2.5 MG tablet Take 1 tablet (2.5 mg total) by mouth daily.   . metolazone (ZAROXOLYN) 5 MG tablet Take by mouth.   . ONE TOUCH ULTRA TEST test strip Check sugar 3 x daily   . simethicone (MYLICON) 425 MG chewable tablet Chew 125 mg by mouth every 6 (six) hours as needed for flatulence.   . traMADol (ULTRAM) 50 MG tablet Take 1-2 tablets (50-100 mg total) by mouth every 12 (twelve)  hours as needed.   . vitamin C (ASCORBIC ACID) 500 MG tablet Take 1,000 mg by mouth daily.    No facility-administered encounter medications on file as of 09/14/2019.     Goals Addressed    .  COMPLETED: "I want to know what resources are available to me" (pt-stated)        Current Barriers:  . Financial constraints . Limited social support . Limited access to food . ADL IADL limitations . Social Isolation . Limited access to caregiver . Lacks knowledge of community resource: financial assistance resources and personal care service resources that are available to him   Clinical Social Work Clinical Goal(s):  [name] Kitchen Over the next 90 days, patient  will work with LCSW to address needs related to lack of support within the home . Over the next 90 days, client will follow up with financial assistance programs/resources* as directed by SW  Interventions: . Patient interviewed and appropriate assessments performed . Provided patient with information about crisis support and financial support resources within [facility] . Update-Patient reports receiving his first EBT card in the mail. Patient reports being eligible for $190.00 per month in food stamps. Patient reports that this will help him with managing his finances AND his physical health better. Patient was appreciative of resource support and connection.  . Discussed plans with patient for ongoing care management follow up and provided patient with direct contact information for care management team . Advised patient to check email for community resources that were sent out on 05/26/2018. Patient confirmed receiving resources and education was provided again on these various resource options today on 09/14/19. Patient will keep these resources to revert back.  . Assisted patient/caregiver with obtaining information about health plan benefits . Patient declines having any further social work needs at this time and is agreeable to contact CCM LCSW if this changes. CCM team updated.   Patient Self Care Activities:  . Attends all scheduled provider appointments . Calls provider office for new concerns or questions  Please see past updates related to this goal by clicking on the "Past Updates" button in the selected goal       Follow Up Plan: Embedded care coordination team will continue to follow patient progress and assist with care coordination needs post discharge.   [name], BSW, MSW, [email or web address] Phone: [phone]

## 2019-09-18 ENCOUNTER — Telehealth: Payer: Self-pay | Admitting: General Practice

## 2019-09-18 ENCOUNTER — Telehealth: Payer: PPO

## 2019-09-18 NOTE — Telephone Encounter (Signed)
°  Chronic Care Management   Outreach Note  09/18/2019 Name: [name] MRN: [id number] DOB: 09-04-1965  Referred by: [name], DO Reason for referral : Chronic Care Management (RNCM follow up call for Chronic Disease Managment and Care coordination needs-2nd attempt)   A second unsuccessful telephone outreach was attempted today. The patient was referred to the case management team for assistance with care management and care coordination.   Follow Up Plan: A HIPPA compliant phone message was left for the patient providing contact information and requesting a return call.   Noreene Larsson RN, MSN, [place] Mobile: [phone]

## 2019-09-27 ENCOUNTER — Telehealth: Payer: Self-pay

## 2019-09-27 NOTE — Telephone Encounter (Signed)
Pt has been r/s for 09/272021 °

## 2019-09-27 NOTE — Chronic Care Management (AMB) (Signed)
  Care Management   Note  09/27/2019 Name: [name] MRN: [id number] DOB: Sep 21, 1965  [name] is a 54 y.o. year old male who is a primary care patient of [name], DO and is actively engaged with the care management team. I reached out to [name] by phone today to assist with re-scheduling a follow up visit with the RN Case Manager  Follow up plan: Unsuccessful telephone outreach attempt made. A HIPPA compliant phone message was left for the patient providing contact information and requesting a return call.  The care management team will reach out to the patient again over the next 7 days.  If patient returns call to provider office, please advise to call [place]  at [place], [place], [place], [place] [address] Direct Dial: [phone] [email or web address] Website: [email or web address]

## 2019-09-27 NOTE — Chronic Care Management (AMB) (Signed)
  Care Management   Note  09/27/2019 Name: [name] MRN: [id number] DOB: 1966/01/24  Okey Regal Krol is a 54 y.o. year old male who is a primary care patient of [name], DO and is actively engaged with the care management team. I reached out to [name] by phone today to assist with re-scheduling a follow up visit with the RN Case Manager  Follow up plan: Telephone appointment with care management team member scheduled for:10/23/2019  Noreene Larsson, [place], [place], [place] [address] Direct Dial: [phone] [email or web address] Website: [email or web address]

## 2019-10-04 DIAGNOSIS — R809 Proteinuria, unspecified: Secondary | ICD-10-CM | POA: Diagnosis not present

## 2019-10-04 DIAGNOSIS — E875 Hyperkalemia: Secondary | ICD-10-CM | POA: Diagnosis not present

## 2019-10-04 DIAGNOSIS — I129 Hypertensive chronic kidney disease with stage 1 through stage 4 chronic kidney disease, or unspecified chronic kidney disease: Secondary | ICD-10-CM | POA: Diagnosis not present

## 2019-10-04 DIAGNOSIS — E1122 Type 2 diabetes mellitus with diabetic chronic kidney disease: Secondary | ICD-10-CM | POA: Diagnosis not present

## 2019-10-04 DIAGNOSIS — N2581 Secondary hyperparathyroidism of renal origin: Secondary | ICD-10-CM | POA: Diagnosis not present

## 2019-10-04 DIAGNOSIS — N184 Chronic kidney disease, stage 4 (severe): Secondary | ICD-10-CM | POA: Diagnosis not present

## 2019-10-23 ENCOUNTER — Telehealth: Payer: PPO

## 2019-10-23 ENCOUNTER — Telehealth: Payer: Self-pay | Admitting: General Practice

## 2019-10-23 NOTE — Telephone Encounter (Cosign Needed)
  Chronic Care Management   Outreach Note  10/23/2019 Name: [name] MRN: [id number] DOB: 1965-06-10  Referred by: [name], DO Reason for referral : Chronic Care Management (RNCM Follow up/3rd attempt for chronic disease managment and Care Coordination Needs)   Third unsuccessful telephone outreach was attempted today. The patient was referred to the case management team for assistance with care management and care coordination. The patient's primary care provider has been notified of our unsuccessful attempts to make or maintain contact with the patient. The care management team is pleased to engage with this patient at any time in the future should he/she be interested in assistance from the care management team.   Follow Up Plan: We have been unable to make contact with the patient for follow up. The care management team is available to follow up with the patient after provider conversation with the patient regarding recommendation for care management engagement and subsequent re-referral to the care management team.   Noreene Larsson RN, MSN, [facility] Mobile: [phone]

## 2019-11-01 ENCOUNTER — Telehealth: Payer: Self-pay

## 2019-11-01 ENCOUNTER — Telehealth: Payer: Self-pay | Admitting: Pharmacist

## 2019-11-01 NOTE — Progress Notes (Signed)
  Chronic Care Management   Outreach Note  11/01/2019 Name: [name] MRN: [id number] DOB: 05/09/1965  Referred by: [name], DO Reason for referral : No chief complaint on file.  Was unable to reach patient via telephone today and have left HIPAA compliant voicemail asking patient to return my call.    Follow Up Plan: Will collaborate with Care Guide to outreach to schedule follow up with me  [name], PharmD, [place] [facility] [phone]

## 2019-11-03 ENCOUNTER — Other Ambulatory Visit: Payer: Self-pay | Admitting: *Deleted

## 2019-11-03 DIAGNOSIS — Z125 Encounter for screening for malignant neoplasm of prostate: Secondary | ICD-10-CM

## 2019-11-03 DIAGNOSIS — E1169 Type 2 diabetes mellitus with other specified complication: Secondary | ICD-10-CM

## 2019-11-03 DIAGNOSIS — Z0000 Encounter for general adult medical examination without abnormal findings: Secondary | ICD-10-CM

## 2019-11-03 DIAGNOSIS — M1A39X0 Chronic gout due to renal impairment, multiple sites, without tophus (tophi): Secondary | ICD-10-CM

## 2019-11-03 DIAGNOSIS — E1122 Type 2 diabetes mellitus with diabetic chronic kidney disease: Secondary | ICD-10-CM

## 2019-11-03 DIAGNOSIS — I5022 Chronic systolic (congestive) heart failure: Secondary | ICD-10-CM

## 2019-11-03 DIAGNOSIS — N184 Chronic kidney disease, stage 4 (severe): Secondary | ICD-10-CM

## 2019-11-03 DIAGNOSIS — Z6841 Body Mass Index (BMI) 40.0 and over, adult: Secondary | ICD-10-CM

## 2019-11-03 DIAGNOSIS — I129 Hypertensive chronic kidney disease with stage 1 through stage 4 chronic kidney disease, or unspecified chronic kidney disease: Secondary | ICD-10-CM

## 2019-11-06 ENCOUNTER — Other Ambulatory Visit: Payer: PPO

## 2019-11-07 ENCOUNTER — Other Ambulatory Visit: Payer: PPO

## 2019-11-08 ENCOUNTER — Encounter: Payer: PPO | Admitting: Family Medicine

## 2019-11-13 ENCOUNTER — Other Ambulatory Visit: Payer: Self-pay | Admitting: Family Medicine

## 2019-11-13 DIAGNOSIS — M159 Polyosteoarthritis, unspecified: Secondary | ICD-10-CM

## 2019-11-13 DIAGNOSIS — I10 Essential (primary) hypertension: Secondary | ICD-10-CM | POA: Diagnosis not present

## 2019-11-13 DIAGNOSIS — E1165 Type 2 diabetes mellitus with hyperglycemia: Secondary | ICD-10-CM | POA: Diagnosis not present

## 2019-11-13 DIAGNOSIS — I50813 Acute on chronic right heart failure: Secondary | ICD-10-CM | POA: Diagnosis not present

## 2019-11-13 DIAGNOSIS — G894 Chronic pain syndrome: Secondary | ICD-10-CM

## 2019-11-13 DIAGNOSIS — N183 Chronic kidney disease, stage 3 unspecified: Secondary | ICD-10-CM | POA: Diagnosis not present

## 2019-11-13 DIAGNOSIS — E1129 Type 2 diabetes mellitus with other diabetic kidney complication: Secondary | ICD-10-CM | POA: Diagnosis not present

## 2019-11-13 NOTE — Telephone Encounter (Signed)
Requested medication (s) are due for refill today - yes  Requested medication (s) are on the active medication list -yes  Future visit scheduled -yes  Last refill: 10/11/19  Notes to clinic: Request for non delegated Rx  Requested Prescriptions  Pending Prescriptions Disp Refills   traMADol (ULTRAM) 50 MG tablet [Pharmacy Med Name: TRAMADOL HCL 50 MG TABLET] 30 tablet 0    Sig: Take 1-2 tablets (50-100 mg total) by mouth every 12 (twelve) hours as needed.      Not Delegated - Analgesics:  Opioid Agonists Failed - 11/13/2019  3:28 PM      Failed - This refill cannot be delegated      Failed - Urine Drug Screen completed in last 360 days.      Passed - Valid encounter within last 6 months    Recent Outpatient Visits           4 months ago Controlled type 2 diabetes mellitus with stage 4 chronic kidney disease, with long-term current use of insulin ([place])   [facility] [place], [name], DO   5 months ago Acute non-recurrent frontal sinusitis   [facility] [place], [name], DO   10 months ago Controlled type 2 diabetes mellitus with stage 4 chronic kidney disease, with long-term current use of insulin ([place])   [facility], [name], DO   1 year ago Benign hypertension with CKD (chronic kidney disease) stage IV [facility])   [facility] [name], DO   1 year ago Acute renal failure superimposed on stage 4 chronic kidney disease, unspecified acute renal failure type [facility])   [place], DO       Future Appointments             In 1 month [place], [name], DO [facility]                Requested Prescriptions  Pending Prescriptions Disp Refills   traMADol (ULTRAM) 50 MG tablet [Pharmacy Med Name: TRAMADOL HCL 50 MG TABLET] 30 tablet 0    Sig: Take 1-2 tablets (50-100 mg total) by mouth every 12  (twelve) hours as needed.      Not Delegated - Analgesics:  Opioid Agonists Failed - 11/13/2019  3:28 PM      Failed - This refill cannot be delegated      Failed - Urine Drug Screen completed in last 360 days.      Passed - Valid encounter within last 6 months    Recent Outpatient Visits           4 months ago Controlled type 2 diabetes mellitus with stage 4 chronic kidney disease, with long-term current use of insulin ([place])   [facility] [place], [name], DO   5 months ago Acute non-recurrent frontal sinusitis   [facility] [name], DO   10 months ago Controlled type 2 diabetes mellitus with stage 4 chronic kidney disease, with long-term current use of insulin ([place])   [facility] [name], DO   1 year ago Benign hypertension with CKD (chronic kidney disease) stage IV [facility])   [facility] [name], DO   1 year ago Acute renal failure superimposed on stage 4 chronic kidney disease, unspecified acute renal failure type [facility])   [facility], [name], DO  Future Appointments             In 1 month [name], [name], DO [facility], [facility]

## 2019-11-15 ENCOUNTER — Telehealth: Payer: Self-pay

## 2019-11-15 NOTE — Chronic Care Management (AMB) (Signed)
  Care Management   Note  11/15/2019 Name: [name] MRN: [id number] DOB: 09-Dec-1965  [name] is a 54 y.o. year old male who is a primary care patient of [name], DO and is actively engaged with the care management team. I reached out to [name] by phone today to assist with re-scheduling a follow up visit with the Pharmacist  Follow up plan: Telephone appointment with care management team member scheduled for:12/06/2019  Noreene Larsson, [place], [place], [place] [address] Direct Dial: [phone] [email or web address] Website: [email or web address]

## 2019-11-15 NOTE — Chronic Care Management (AMB) (Signed)
  Care Management   Note  11/15/2019 Name: KALON ERHARDT MRN: [id number] DOB: [birth year]  Okey Regal Amrhein is a 54 y.o. year old male who is a primary care patient of [name], DO and is actively engaged with the care management team. I reached out to [name] by phone today to assist with re-scheduling a follow up visit with the Pharmacist  Follow up plan: Unsuccessful telephone outreach attempt made. A HIPAA compliant phone message was left for the patient providing contact information and requesting a return call.  The care management team will reach out to the patient again over the next 7 days.  If patient returns call to provider office, please advise to call [place]  at [place], [place], [place], [place] [address] Direct Dial: [phone] [email or web address] Website: [email or web address]

## 2019-11-15 NOTE — Telephone Encounter (Signed)
Pt has been r/s  

## 2019-11-21 ENCOUNTER — Other Ambulatory Visit: Payer: Self-pay | Admitting: Family Medicine

## 2019-11-21 DIAGNOSIS — I129 Hypertensive chronic kidney disease with stage 1 through stage 4 chronic kidney disease, or unspecified chronic kidney disease: Secondary | ICD-10-CM

## 2019-11-21 NOTE — Telephone Encounter (Signed)
Requested Prescriptions  Pending Prescriptions Disp Refills   lisinopril (ZESTRIL) 2.5 MG tablet [Pharmacy Med Name: LISINOPRIL 2.5 MG TABLET] 90 tablet 0    Sig: Take 1 tablet (2.5 mg total) by mouth daily.     Cardiovascular:  ACE Inhibitors Failed - 11/21/2019  8:14 AM      Failed - Cr in normal range and within 180 days    Creat  Date Value Ref Range Status  10/05/2018 1.83 (H) 0.70 - 1.33 mg/dL Final    Comment:    For patients >53 years of age, the reference limit for Creatinine is approximately 13% higher for people identified as African-American. .    Creatinine, POC  Date Value Ref Range Status  05/07/2015 0 mg/dL Final         Failed - K in normal range and within 180 days    Potassium  Date Value Ref Range Status  10/05/2018 4.2 3.5 - 5.3 mmol/L Final         Passed - Patient is not pregnant      Passed - Last BP in normal range    BP Readings from Last 1 Encounters:  06/27/19 132/84         Passed - Valid encounter within last 6 months    Recent Outpatient Visits          4 months ago Controlled type 2 diabetes mellitus with stage 4 chronic kidney disease, with long-term current use of insulin ([place])   [place], [name], DO   5 months ago Acute non-recurrent frontal sinusitis   [place], DO   10 months ago Controlled type 2 diabetes mellitus with stage 4 chronic kidney disease, with long-term current use of insulin ([place])   [facility] [place], [name], DO   1 year ago Benign hypertension with CKD (chronic kidney disease) stage IV [facility] [place])   [name], DO   1 year ago Acute renal failure superimposed on stage 4 chronic kidney disease, unspecified acute renal failure type [facility])   [place], [name], DO      Future Appointments            In 1 month [place], [name], DO  [facility], [facility]

## 2019-11-28 ENCOUNTER — Other Ambulatory Visit: Payer: Self-pay | Admitting: Family Medicine

## 2019-11-28 DIAGNOSIS — E1169 Type 2 diabetes mellitus with other specified complication: Secondary | ICD-10-CM

## 2019-11-28 DIAGNOSIS — E785 Hyperlipidemia, unspecified: Secondary | ICD-10-CM

## 2019-11-28 NOTE — Telephone Encounter (Signed)
Requested medication (s) are due for refill today: yes  Requested medication (s) are on the active medication list: yes  Last refill:  08/03/19  #90  1 refill  Future visit scheduled:yes  Notes to clinic: Labs are from 10/05/18    Requested Prescriptions  Pending Prescriptions Disp Refills   atorvastatin (LIPITOR) 20 MG tablet [Pharmacy Med Name: ATORVASTATIN 20 MG TABLET] 30 tablet 0    Sig: Take 1 tablet (20 mg total) by mouth daily.      Cardiovascular:  Antilipid - Statins Failed - 11/28/2019  4:12 PM      Failed - Total Cholesterol in normal range and within 360 days    Cholesterol, Total  Date Value Ref Range Status  01/17/2015 105 100 - 199 mg/dL Final   Cholesterol  Date Value Ref Range Status  10/05/2018 89 <200 mg/dL Final   Cholesterol Piccolo, Waived  Date Value Ref Range Status  08/07/2014 110 <200 mg/dL Final    Comment:                            Desirable                <200                         Borderline High      200- 239                         High                     >239           Failed - LDL in normal range and within 360 days    LDL Cholesterol (Calc)  Date Value Ref Range Status  10/05/2018 16 mg/dL (calc) Final    Comment:    Reference range: <100 . Desirable range <100 mg/dL for primary prevention;   <70 mg/dL for patients with CHD or diabetic patients  with > or = 2 CHD risk factors. [name] Kitchen LDL-C is now calculated using the Martin-Hopkins  calculation, which is a validated novel method providing  better accuracy than the Friedewald equation in the  estimation of LDL-C.  Cresenciano Genre et al. Annamaria Helling. 4081;448(18): 2061-2068  (http://education.[employer].com/faq/FAQ164)           Failed - HDL in normal range and within 360 days    HDL  Date Value Ref Range Status  10/05/2018 50 > OR = 40 mg/dL Final  01/17/2015 48 >39 mg/dL Final          Failed - Triglycerides in normal range and within 360 days    Triglycerides  Date Value  Ref Range Status  10/05/2018 150 (H) <150 mg/dL Final   Triglycerides Piccolo,Waived  Date Value Ref Range Status  08/07/2014 159 (H) <150 mg/dL Final    Comment:                            Normal                   <150                         Borderline High     150 - 199  High                200 - 499                         Very High                >499           Passed - Patient is not pregnant      Passed - Valid encounter within last 12 months    Recent Outpatient Visits           5 months ago Controlled type 2 diabetes mellitus with stage 4 chronic kidney disease, with long-term current use of insulin ([place])   [facility] [place], [name], DO   5 months ago Acute non-recurrent frontal sinusitis   [facility] [name], DO   11 months ago Controlled type 2 diabetes mellitus with stage 4 chronic kidney disease, with long-term current use of insulin ([place])   [facility] [name], DO   1 year ago Benign hypertension with CKD (chronic kidney disease) stage IV [facility])   [facility] [name], DO   1 year ago Acute renal failure superimposed on stage 4 chronic kidney disease, unspecified acute renal failure type [facility])   [facility] [name], DO       Future Appointments             In 1 month [place], [name], DO [facility], [facility]

## 2019-12-06 ENCOUNTER — Ambulatory Visit: Payer: PPO | Admitting: Pharmacist

## 2019-12-06 DIAGNOSIS — N184 Chronic kidney disease, stage 4 (severe): Secondary | ICD-10-CM

## 2019-12-06 DIAGNOSIS — E1169 Type 2 diabetes mellitus with other specified complication: Secondary | ICD-10-CM

## 2019-12-06 DIAGNOSIS — E1122 Type 2 diabetes mellitus with diabetic chronic kidney disease: Secondary | ICD-10-CM

## 2019-12-06 NOTE — Chronic Care Management (AMB) (Signed)
Chronic Care Management   Follow Up Note   12/06/2019 Name: [name] MRN: [id number] DOB: Sep 28, 1965  Referred by: [name], DO Reason for referral : Chronic Care Management (Patient Phone Call)   [name] is a 54 y.o. year old male who is a primary care patient of [name], DO. The CCM team was consulted for assistance with chronic disease management and care coordination needs.    I reached out to [name] by phone today.   Review of patient status, including review of consultants reports, relevant laboratory and other test results, and collaboration with appropriate care team members and the patient's provider was performed as part of comprehensive patient evaluation and provision of chronic care management services.    SDOH (Social Determinants of Health) assessments performed: No See Care Plan activities for detailed interventions related to [facility])     Outpatient Encounter Medications as of 12/06/2019  Medication Sig Note  . atorvastatin (LIPITOR) 20 MG tablet Take 1 tablet (20 mg total) by mouth daily.   . Dulaglutide (TRULICITY) 5.10 CH/8.5ID SOPN Inject 0.75 mg into the skin once a week. 07/20/2018: Taking on Fridays  . Insulin Glargine (BASAGLAR KWIKPEN) 100 UNIT/ML SOPN Inject 10 Units into the skin daily. Submitted to [employer] for assistance 12/06/2019: Reports taking 14 units each morning  . albuterol (PROVENTIL) (2.5 MG/3ML) 0.083% nebulizer solution Take 3 mLs (2.5 mg total) by nebulization every 6 (six) hours as needed for wheezing or shortness of breath.   [name] Kitchen albuterol (VENTOLIN HFA) 108 (90 Base) MCG/ACT inhaler Inhale 2 puffs into the lungs every 4 (four) hours as needed for wheezing or shortness of breath.   . allopurinol (ZYLOPRIM) 100 MG tablet Take 2 tablets (200 mg total) by mouth daily.   [name] Kitchen aspirin 81 MG chewable tablet Chew 81 mg by mouth daily.   . blood glucose meter kit and supplies K[employer] Brand of  choice; LON 99 months; check FSBS 3x a day; E11.65; disp one meter with 100 strips + 1 refill of strips   . Blood Glucose Monitoring Suppl (ONE TOUCH ULTRA 2) w/Device KIT Use to check blood glucose up to 3 x daily   . bumetanide (BUMEX) 1 MG tablet Take 1 mg by mouth 2 (two) times daily. Per Dr. Juleen China 10/06/2018: Taking 2 mg (2 tablets) each morning and 1 mg (1 tablet) each afternoon (as last prescribed by Cardiology)  . calcitRIOL (ROCALTROL) 0.5 MCG capsule Take 0.5 mcg by mouth daily.   . carvedilol (COREG) 25 MG tablet Take 2 tablets (50 mg total) by mouth 2 (two) times daily with a meal.   . CLARITIN 10 MG tablet Take 1 tablet (10 mg total) by mouth daily. One po daily x 5 days   . esomeprazole (NEXIUM) 40 MG capsule Take 1 capsule (40 mg total) by mouth daily before breakfast.   . hydrOXYzine (VISTARIL) 25 MG capsule Take 1 capsule (25 mg total) by mouth every 8 (eight) hours as needed for itching.   . Insulin Pen Needle (B-D UF III MINI PEN NEEDLES) 31G X 5 MM MISC 1 each by Does not apply route daily. Use once daily with Victoza.   . Lancets (ONETOUCH ULTRASOFT) lancets Check blood glucose up to 3 x daily   . lisinopril (ZESTRIL) 2.5 MG tablet Take 1 tablet (2.5 mg total) by mouth daily.   . metolazone (ZAROXOLYN) 5 MG tablet Take by mouth.   . ONE TOUCH ULTRA TEST test strip  Check sugar 3 x daily   . simethicone (MYLICON) 413 MG chewable tablet Chew 125 mg by mouth every 6 (six) hours as needed for flatulence.   . traMADol (ULTRAM) 50 MG tablet Take 1-2 tablets (50-100 mg total) by mouth every 12 (twelve) hours as needed.   . vitamin C (ASCORBIC ACID) 500 MG tablet Take 1,000 mg by mouth daily.    No facility-administered encounter medications on file as of 12/06/2019.    Goals Addressed            This Visit's Progress   . PharmD - Medication Management       CARE PLAN ENTRY (see longitudinal plan of care for additional care plan information)  Current Barriers:  . Financial  Barriers  . Lack of blood sugar or blood pressure results for the clinical team . Unable to keep appointments with CCM team  Pharmacist Clinical Goal(s):  [name] Kitchen Over the next 30 days, patient will work with CM Pharmacist to address needs related to medication therapy optimization  Interventions: . Collaborated with PCP following visit with provider on 6/1.  ? Patient advised to continue Trulicity 2.44WN weekly (had just picked up supply of medication) and to REDUCE Basaglar dose to 10 units daily ? With future plan (once current supply of Trulicity 0.27 mg used up) to increase Trulicity dose to 1.5 mg weekly and gradually reduce dose of Basaglar at that time  . Today discuss importance of continued blood sugar control and monitoring ? Latest A1C: Lab Results  Component Value Date   HGBA1C 6.4 (A) 06/27/2019   ? Reports taking:  ? Trulicity 2.53 mg weekly ? Basaglar 14 units once daily. ? Patient unable to provide specific readings, but recalls recent CBGs ranging: 106-128 ? Denies low blood sugars ? Reports having ~2 month supply of Trulicity remaining at home ? Patient expresses interest in Trulicity dose increase at this time - let patient know that I will collaborate about this with PCP . Unable to review medications today as patient is not currently home . Assess adherence to atorvasatin 20 mg daily (as identified by health plan).  ? Patient denies missed doses of atorvastatin ? From review of dispensing history in chart, latest refills: ? 9/2 (30 day supply) ? 10/2 (30 day supply) ? 11/4 (30 day supply) ? Encourage patient to request 90 day supply of atorvastatin from pharmacy, as would aid with both adherence and cost . Will collaborate with PCP regarding patient's diabetes medication management . Will follow up with patient's health plan, HTA, for assistance with patient assistance program re-enrollment for 2022 calendar year following collaboration with PCP.  Patient Self Care  Activities:  . Self administers medications . Calls pharmacy for medication refills . Calls provider office for new concerns or questions  . Patient to check daily blood sugars, including fasting blood sugars, and keep log . Patient UNABLE to monitor home BP  Please see past updates related to this goal by clicking on the "Past Updates" button in the selected goal         Plan  The care management team will reach out to the patient again over the next 7 days.   [name], PharmD, [place] [employer] [phone]

## 2019-12-08 ENCOUNTER — Ambulatory Visit: Payer: Self-pay | Admitting: Pharmacist

## 2019-12-08 DIAGNOSIS — N184 Chronic kidney disease, stage 4 (severe): Secondary | ICD-10-CM

## 2019-12-08 DIAGNOSIS — E1122 Type 2 diabetes mellitus with diabetic chronic kidney disease: Secondary | ICD-10-CM

## 2019-12-08 NOTE — Patient Instructions (Signed)
Thank you allowing the Chronic Care Management Team to be a part of your care! It was a pleasure speaking with you today!     CCM (Chronic Care Management) Team    Noreene Larsson RN, MSN, CCM Nurse Care Coordinator  [phone]   [name] PharmD  Clinical Pharmacist  [phone]   [name] LCSW Clinical Social Worker [phone]  Visit Information  Goals Addressed            This Visit's Progress    PharmD - Medication Management       CARE PLAN ENTRY (see longitudinal plan of care for additional care plan information)  Current Barriers:   Financial Barriers   Lack of blood sugar or blood pressure results for the clinical team  Unable to keep appointments with CCM team  Pharmacist Clinical Goal(s):   Over the next 30 days, patient will work with CM Pharmacist to address needs related to medication therapy optimization  Interventions:  Collaborated with PCP following visit with provider on 6/1.  ? Patient advised to continue Trulicity 0.75mg  weekly (had just picked up supply of medication) and to REDUCE Basaglar dose to 10 units daily ? With future plan (once current supply of Trulicity 7.62 mg used up) to increase Trulicity dose to 1.5 mg weekly and gradually reduce dose of Basaglar at that time   Today discuss importance of continued blood sugar control and monitoring ? Latest A1C: Lab Results  Component Value Date   HGBA1C 6.4 (A) 06/27/2019   ? Reports taking:  ? Trulicity 2.63 mg weekly ? Basaglar 14 units once daily. ? Patient unable to provide specific readings, but recalls recent CBGs ranging: 106-128 ? Denies low blood sugars ? Reports having ~2 month supply of Trulicity remaining at home ? Patient expresses interest in Trulicity dose increase at this time - let patient know that I will collaborate about this with PCP  Unable to review medications today as patient is not currently home  Assess adherence to atorvasatin 20 mg daily  (as identified by health plan).  ? Patient denies missed doses of atorvastatin ? From review of dispensing history in chart, latest refills: ? 9/2 (30 day supply) ? 10/2 (30 day supply) ? 11/4 (30 day supply) ? Encourage patient to request 90 day supply of atorvastatin from pharmacy, as would aid with both adherence and cost  Will collaborate with PCP regarding patient's diabetes medication management  Will follow up with patient's health plan, HTA, for assistance with patient assistance program re-enrollment for 2022 calendar year following collaboration with PCP.  Patient Self Care Activities:   Self administers medications  Calls pharmacy for medication refills  Calls provider office for new concerns or questions   Patient to check daily blood sugars, including fasting blood sugars, and keep log  Patient UNABLE to monitor home BP  Please see past updates related to this goal by clicking on the "Past Updates" button in the selected goal        The patient verbalized understanding of instructions, educational materials, and care plan provided today   The care management team will reach out to the patient again over the next 7 days.   [name], PharmD, [place] [employer] [phone]

## 2019-12-08 NOTE — Chronic Care Management (AMB) (Signed)
Chronic Care Management   Follow Up Note   12/08/2019 Name: [name] MRN: [id number] DOB: 06/06/1965  Referred by: [name], DO Reason for referral : Chronic Care Management (Patient Phone Call)   [name] is a 54 y.o. year old male who is a primary care patient of [name], DO. The CCM team was consulted for assistance with chronic disease management and care coordination needs.    I reached out to [name] by phone today.   Review of patient status, including review of consultants reports, relevant laboratory and other test results, and collaboration with appropriate care team members and the patient's provider was performed as part of comprehensive patient evaluation and provision of chronic care management services.    SDOH (Social Determinants of Health) assessments performed: No See Care Plan activities for detailed interventions related to [facility])     Outpatient Encounter Medications as of 12/08/2019  Medication Sig Note  . Dulaglutide (TRULICITY) 7.65 YY/5.0PT SOPN Inject 0.75 mg into the skin once a week. 07/20/2018: Taking on Fridays  . Insulin Glargine (BASAGLAR KWIKPEN) 100 UNIT/ML SOPN Inject 10 Units into the skin daily. Submitted to [employer] for assistance 12/06/2019: Reports taking 14 units each morning  . albuterol (PROVENTIL) (2.5 MG/3ML) 0.083% nebulizer solution Take 3 mLs (2.5 mg total) by nebulization every 6 (six) hours as needed for wheezing or shortness of breath.   [name] Kitchen albuterol (VENTOLIN HFA) 108 (90 Base) MCG/ACT inhaler Inhale 2 puffs into the lungs every 4 (four) hours as needed for wheezing or shortness of breath.   . allopurinol (ZYLOPRIM) 100 MG tablet Take 2 tablets (200 mg total) by mouth daily.   [name] Kitchen aspirin 81 MG chewable tablet Chew 81 mg by mouth daily.   [name] Kitchen atorvastatin (LIPITOR) 20 MG tablet Take 1 tablet (20 mg total) by mouth daily.   . blood glucose meter kit and supplies KIT Brand of  choice; LON 99 months; check FSBS 3x a day; E11.65; disp one meter with 100 strips + 1 refill of strips   . Blood Glucose Monitoring Suppl (ONE TOUCH ULTRA 2) w/Device KIT Use to check blood glucose up to 3 x daily   . bumetanide (BUMEX) 1 MG tablet Take 1 mg by mouth 2 (two) times daily. Per Dr. Juleen China 10/06/2018: Taking 2 mg (2 tablets) each morning and 1 mg (1 tablet) each afternoon (as last prescribed by Cardiology)  . calcitRIOL (ROCALTROL) 0.5 MCG capsule Take 0.5 mcg by mouth daily.   . carvedilol (COREG) 25 MG tablet Take 2 tablets (50 mg total) by mouth 2 (two) times daily with a meal.   . CLARITIN 10 MG tablet Take 1 tablet (10 mg total) by mouth daily. One po daily x 5 days   . esomeprazole (NEXIUM) 40 MG capsule Take 1 capsule (40 mg total) by mouth daily before breakfast.   . hydrOXYzine (VISTARIL) 25 MG capsule Take 1 capsule (25 mg total) by mouth every 8 (eight) hours as needed for itching.   . Insulin Pen Needle (B-D UF III MINI PEN NEEDLES) 31G X 5 MM MISC 1 each by Does not apply route daily. Use once daily with Victoza.   . Lancets (ONETOUCH ULTRASOFT) lancets Check blood glucose up to 3 x daily   . lisinopril (ZESTRIL) 2.5 MG tablet Take 1 tablet (2.5 mg total) by mouth daily.   . metolazone (ZAROXOLYN) 5 MG tablet Take by mouth.   . ONE TOUCH ULTRA TEST test strip  Check sugar 3 x daily   . simethicone (MYLICON) 268 MG chewable tablet Chew 125 mg by mouth every 6 (six) hours as needed for flatulence.   . traMADol (ULTRAM) 50 MG tablet Take 1-2 tablets (50-100 mg total) by mouth every 12 (twelve) hours as needed.   . vitamin C (ASCORBIC ACID) 500 MG tablet Take 1,000 mg by mouth daily.    No facility-administered encounter medications on file as of 12/08/2019.    Goals Addressed            This Visit's Progress   . PharmD - Medication Management       CARE PLAN ENTRY (see longitudinal plan of care for additional care plan information)  Current Barriers:  . Financial  Barriers  . Lack of blood sugar or blood pressure results for the clinical team . Unable to keep appointments with CCM team  Pharmacist Clinical Goal(s):  [name] Kitchen Over the next 30 days, patient will work with CM Pharmacist to address needs related to medication therapy optimization  Interventions: . Collaborated with PCP regarding patient diabetes medication management and patient's interest in increasing Trulicity dose to 1.5 mg weekly (per conversation on 11/10) . Follow up with patient regarding diabetes medication management ? Today patient states that he has changed his mind and would like to continue same doses of Trulicity and Basaglar that he is currently taking (Trulicity 3.41 mg weekly and Basaglar 14 units daily) for now as reports he likes how his blood sugars have been running ? Reports having plenty of both Trulicity and Basaglar remaining from assistance program ? Address patient's questions about Trulicity administration and counsel on administration technique . Schedule appointment with patient to review medications . Will collaborate with PCP  ? Note next appointment with PCP scheduled for 12/9 . Will follow up with patient's health plan, HTA, for assistance with patient assistance program re-enrollment for 2022 calendar year. Also advise patient that he can contact H[employer] directly for this assistance by calling number for "Healthcare Concierge" on the back of his card.   Patient Self Care Activities:  . Self administers medications . Calls pharmacy for medication refills . Calls provider office for new concerns or questions  . Patient to check daily blood sugars, including fasting blood sugars, and keep log . Patient UNABLE to monitor home BP  Please see past updates related to this goal by clicking on the "Past Updates" button in the selected goal         Plan  Telephone follow up appointment with care management team member scheduled for: 12/20 at 9:45 am  [name], PharmD, [place] [phone]

## 2019-12-08 NOTE — Patient Instructions (Addendum)
Thank you allowing the Chronic Care Management Team to be a part of your care! It was a pleasure speaking with you today!     CCM (Chronic Care Management) Team    Noreene Larsson RN, MSN, CCM Nurse Care Coordinator  [phone]   [name] PharmD  Clinical Pharmacist  [phone]   [name] LCSW Clinical Social Worker [phone]  Visit Information  Goals Addressed            This Visit's Progress   . PharmD - Medication Management       CARE PLAN ENTRY (see longitudinal plan of care for additional care plan information)  Current Barriers:  . Financial Barriers  . Lack of blood sugar or blood pressure results for the clinical team . Unable to keep appointments with CCM team  Pharmacist Clinical Goal(s):  [name] Kitchen Over the next 30 days, patient will work with CM Pharmacist to address needs related to medication therapy optimization  Interventions: . Collaborated with PCP regarding patient diabetes medication management and patient's interest in increasing Trulicity dose to 1.5 mg weekly (per conversation on 11/10) . Follow up with patient regarding diabetes medication management ? Today patient states that he has changed his mind and would like to continue same doses of Trulicity and Basaglar that he is currently taking (Trulicity 9.21 mg weekly and Basaglar 14 units daily) for now as reports he likes how his blood sugars have been running ? Reports having plenty of both Trulicity and Basaglar remaining from assistance program ? Address patient's questions about Trulicity administration and counsel on administration technique . Schedule appointment with patient to review medications . Will collaborate with PCP  ? Note next appointment with PCP scheduled for 12/9 . Will follow up with patient's health plan, HTA, for assistance with patient assistance program re-enrollment for 2022 calendar year. Also advise patient that he can contact HTA directly for this assistance  by calling number for "Healthcare Concierge" on the back of his card.   Patient Self Care Activities:  . Self administers medications . Calls pharmacy for medication refills . Calls provider office for new concerns or questions  . Patient to check daily blood sugars, including fasting blood sugars, and keep log . Patient UNABLE to monitor home BP  Please see past updates related to this goal by clicking on the "Past Updates" button in the selected goal         The patient verbalized understanding of instructions, educational materials, and care plan provided today  Telephone follow up appointment with care management team member scheduled for: 12/20 at 9:45 am  [name], PharmD, [place] [phone]

## 2019-12-20 ENCOUNTER — Telehealth: Payer: Self-pay

## 2019-12-20 ENCOUNTER — Other Ambulatory Visit: Payer: Self-pay | Admitting: Family Medicine

## 2019-12-20 NOTE — Telephone Encounter (Signed)
unable to reach patient please triage with provider's recommendation.

## 2019-12-20 NOTE — Telephone Encounter (Signed)
Patient given information below from Dr. Parks Ranger, patient verbalized understanding.

## 2019-12-20 NOTE — Telephone Encounter (Signed)
Copied from [place] [phone]. Topic: General - Other >> Dec 20, 2019  9:13 AM Hinda Lenis D wrote: PT need to speak with a nurse about this meds / bumetanide (BUMEX) 1 MG tablet [[id number]] / please advise >> Dec 20, 2019 12:10 PM [name] wrote: Patient stated he is returning a call to the nurse.  He called back during lunch.  Please call patient back at [phone]

## 2019-12-20 NOTE — Telephone Encounter (Signed)
As per patient Metolazone was making his kidney shrink per Dr Juleen China so he wants to take [place] 6 tab per day for fluid retention --three in the morning and three in the evening which was not Rx by Dr Raliegh Ip.

## 2019-12-20 NOTE — Telephone Encounter (Signed)
Reviewed chart.  Yes based on complexity and severity of his Congestive Heart Failure, it is not something that I am primarily treating.  He is followed by [facility]  [place]  [address]  [place], [place] [address]  [phone]   Last apt was 11/13/19.  They recommended Bumex 2mg  twice a day.  It looks like they may still have him on Metolazone 5mg  one tablet ONLY AS NEEDED for swelling. But this should not be a regular medication.  Right. He will need to discuss this with his Cardiologist to determine any dose change or new rx for these fluid pills, as these are not something that I would prescribe.  [name], [facility] 12/20/2019, 2:38 PM

## 2019-12-28 ENCOUNTER — Other Ambulatory Visit: Payer: PPO

## 2019-12-28 ENCOUNTER — Other Ambulatory Visit: Payer: Self-pay | Admitting: Family Medicine

## 2019-12-28 DIAGNOSIS — M1A39X0 Chronic gout due to renal impairment, multiple sites, without tophus (tophi): Secondary | ICD-10-CM

## 2020-01-04 ENCOUNTER — Ambulatory Visit (INDEPENDENT_AMBULATORY_CARE_PROVIDER_SITE_OTHER): Payer: PPO | Admitting: Family Medicine

## 2020-01-04 ENCOUNTER — Other Ambulatory Visit: Payer: Self-pay

## 2020-01-04 ENCOUNTER — Encounter: Payer: Self-pay | Admitting: Family Medicine

## 2020-01-04 VITALS — BP 155/97 | HR 88 | Ht 71.0 in | Wt 351.0 lb

## 2020-01-04 DIAGNOSIS — Z794 Long term (current) use of insulin: Secondary | ICD-10-CM | POA: Diagnosis not present

## 2020-01-04 DIAGNOSIS — E1122 Type 2 diabetes mellitus with diabetic chronic kidney disease: Secondary | ICD-10-CM

## 2020-01-04 DIAGNOSIS — I5022 Chronic systolic (congestive) heart failure: Secondary | ICD-10-CM

## 2020-01-04 DIAGNOSIS — J302 Other seasonal allergic rhinitis: Secondary | ICD-10-CM

## 2020-01-04 DIAGNOSIS — E1169 Type 2 diabetes mellitus with other specified complication: Secondary | ICD-10-CM

## 2020-01-04 DIAGNOSIS — Z0000 Encounter for general adult medical examination without abnormal findings: Secondary | ICD-10-CM | POA: Diagnosis not present

## 2020-01-04 DIAGNOSIS — I129 Hypertensive chronic kidney disease with stage 1 through stage 4 chronic kidney disease, or unspecified chronic kidney disease: Secondary | ICD-10-CM | POA: Diagnosis not present

## 2020-01-04 DIAGNOSIS — E785 Hyperlipidemia, unspecified: Secondary | ICD-10-CM

## 2020-01-04 DIAGNOSIS — I420 Dilated cardiomyopathy: Secondary | ICD-10-CM

## 2020-01-04 DIAGNOSIS — M1A39X0 Chronic gout due to renal impairment, multiple sites, without tophus (tophi): Secondary | ICD-10-CM

## 2020-01-04 DIAGNOSIS — Z6841 Body Mass Index (BMI) 40.0 and over, adult: Secondary | ICD-10-CM

## 2020-01-04 DIAGNOSIS — N184 Chronic kidney disease, stage 4 (severe): Secondary | ICD-10-CM

## 2020-01-04 DIAGNOSIS — E113299 Type 2 diabetes mellitus with mild nonproliferative diabetic retinopathy without macular edema, unspecified eye: Secondary | ICD-10-CM | POA: Diagnosis not present

## 2020-01-04 DIAGNOSIS — Z125 Encounter for screening for malignant neoplasm of prostate: Secondary | ICD-10-CM | POA: Diagnosis not present

## 2020-01-04 MED ORDER — LORATADINE 10 MG PO TABS: 10.0000 mg | ORAL_TABLET | Freq: Every day | ORAL | 3 refills | Status: DC

## 2020-01-04 NOTE — Assessment & Plan Note (Signed)
Chronic underlying etiology for CHF See A&P For Systolic CHF

## 2020-01-04 NOTE — Patient Instructions (Addendum)
Thank you for coming to the office today.  Keep up the good work!  Notify if need refills.  Will get copy of eye exam.  -------------------------   Follow instructions to collect sample, you may call the company for any help or questions, 24/7 telephone support at [phone].   Please schedule a Follow-up Appointment to: Return in about 6 months (around 07/04/2020) for 6 month follow-up DM A1c, HTN.  If you have any other questions or concerns, please feel free to call the office or send a message through [place]. You may also schedule an earlier appointment if necessary.  Additionally, you may be receiving a survey about your experience at our office within a few days to 1 week by e-mail or mail. We value your feedback.  [name], DO [place]

## 2020-01-04 NOTE — Assessment & Plan Note (Signed)
Stable CKD-IV Followed by [facility] Dr Juleen China Remains on allopurinol lower dosage for gout prophylaxis On low dose ACEi On diuretics per [facility] CHF Continue improved hydration

## 2020-01-04 NOTE — Assessment & Plan Note (Signed)
Stable, euvolemic at baseline on compression stockings Currently well compensated, chronic systolic CHF secondary to non-ischemic cardiomyopathy Followed by [facility] Cardiology CHF clinic On med management low dose ACEi Lisinopril 2.5mg , Carvedilol, Bumex Has declined ICD in past

## 2020-01-04 NOTE — Assessment & Plan Note (Signed)
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 For now continue Trulicity 0.75mg  weekly injection as patient declines to take dose increase at 1.5mg  dose - offered today with goal to further reduce insulin basaglar - given Basaglar supply from PAP program, 2 boxes, 10u daily 2. Encourage improved lifestyle - low carb, low sugar diet, reduce portion size, continue improving regular exercise 3. Check CBG, bring log to next visit for review 4. Continue ASA, ACEi, Statin

## 2020-01-04 NOTE — Progress Notes (Signed)
Subjective:    Patient ID: [name], male    DOB: 1965-02-05, 54 y.o.   MRN: [id number]  [name] is a 54 y.o. male presenting on 01/04/2020 for Annual Exam (Needs to get his medication from the fridge before he leaves his visit today.)   HPI   CHRONIC DM, Type 2w/ complication CKD-IV Morbid Obesity BMI >55 Secondary renal hyperparathyroidism Hyperlipidemia Chronic Systolic CHF Followed by Dr Juleen China [facility]ph[facility], [facility] Cardiology Due to pick up Sarepta from our office from [facility] CBGs: no readings today Meds:Trulicity 2.80KL weekly inj (he declines dose increase),Basaglar 10u units - on financial assistance through manufacturer Reports good compliance. Tolerating well w/o side-effects Currently on ACEi Lifestyle: - Diet (improving) - Exercise (limited) Last DM Eye at [facility] - will request copy of report. Denies hypoglycemia, polyuria, visual changes, numbness or tingling   Health Maintenance: Cologuard has kit at home, will do when ready.  Depression screen [facility] 2/9 01/04/2020 06/27/2019 05/18/2019  Decreased Interest 0 0 0  Down, Depressed, Hopeless 0 0 0  PHQ - 2 Score 0 0 0  Altered sleeping 0 - -  Tired, decreased energy 0 - -  Change in appetite 0 - -  Feeling bad or failure about yourself  0 - -  Trouble concentrating 0 - -  Moving slowly or fidgety/restless 0 - -  Suicidal thoughts 0 - -  PHQ-9 Score 0 - -  Difficult doing work/chores Not difficult at all - -    Social History   Tobacco Use  . Smoking status: Never Smoker  . Smokeless tobacco: Never Used  Substance Use Topics  . Alcohol use: Yes    Comment: a glass of wine and a couple beers a day  . Drug use: No    Review of Systems Per HPI unless specifically indicated above     Objective:    BP (!) 155/97   Pulse 88   Ht '5\' 11"'  (1.803 m)   Wt (!) 351 lb (159.2 kg)   SpO2 96%   BMI 48.95 kg/m   Wt Readings from Last 3 Encounters:  01/04/20 (!) 351 lb (159.2 kg)   06/27/19 (!) 369 lb 9.6 oz (167.6 kg)  01/24/19 (!) 335 lb (152 kg)    Physical Exam Vitals and nursing note reviewed.  Constitutional:      General: He is not in acute distress.    Appearance: He is well-developed and well-nourished. He is obese. He is not diaphoretic.     Comments: Well-appearing, comfortable, cooperative  HENT:     Head: Normocephalic and atraumatic.     Mouth/Throat:     Mouth: Oropharynx is clear and moist.  Eyes:     General:        Right eye: No discharge.        Left eye: No discharge.     Conjunctiva/sclera: Conjunctivae normal.  Neck:     Thyroid: No thyromegaly.  Cardiovascular:     Rate and Rhythm: Normal rate and regular rhythm.     Pulses: Intact distal pulses.     Heart sounds: Normal heart sounds. No murmur heard.   Pulmonary:     Effort: Pulmonary effort is normal. No respiratory distress.     Breath sounds: Normal breath sounds. No wheezing or rales.  Musculoskeletal:        General: No edema. Normal range of motion.     Cervical back: Normal range of motion and neck supple.  Lymphadenopathy:  Cervical: No cervical adenopathy.  Skin:    General: Skin is warm and dry.     Findings: No erythema or rash.  Neurological:     Mental Status: He is alert and oriented to person, place, and time.  Psychiatric:        Mood and Affect: Mood and affect normal.        Behavior: Behavior normal.     Comments: Well groomed, good eye contact, normal speech and thoughts      Recent Labs    06/27/19 0830  HGBA1C 6.4*    Results for orders placed or performed in visit on 06/27/19  POCT HgB A1C  Result Value Ref Range   Hemoglobin A1C 6.4 (A) 4.0 - 5.6 %      Assessment & Plan:   Problem List Items Addressed This Visit    Type 2 diabetes mellitus, controlled, with renal complications (HCC)    Last A1c controlled On GLP1 Complications - CKD-IV, DM retinopathy Also complicated by hypoglycemia episodes Failed Bydureon (unable to  operate pen)  Plan:  1. For now continue Trulicity 8.11XB weekly injection as patient declines to take dose increase at 1.86m dose - offered today with goal to further reduce insulin basaglar - given Basaglar supply from [facility] program, 2 boxes, 10u daily 2. Encourage improved lifestyle - low carb, low sugar diet, reduce portion size, continue improving regular exercise 3. Check CBG, bring log to next visit for review 4. Continue ASA, ACEi, Statin      Morbid obesity with BMI of 45.0-49.9, adult (HCC)   Hyperlipidemia associated with type 2 diabetes mellitus (HCC)   Gout   CKD (chronic kidney disease), stage IV (HCC)    Stable CKD-IV Followed by [facility] Dr KJuleen ChinaRemains on allopurinol lower dosage for gout prophylaxis On low dose ACEi [facility] Continue improved hydration      Chronic systolic heart failure (HCC)    Stable, euvolemic at baseline on compression stockings Currently well compensated, chronic systolic CHF secondary to non-ischemic cardiomyopathy [facility] On med management low dose ACEi Lisinopril 2.572m Carvedilol, Bumex Has declined ICD in past      Cardiomyopathy, dilated, nonischemic (HCPerry   Chronic underlying etiology for CHF See A&P For Systolic CHF      Benign hypertension with CKD (chronic kidney disease) stage IV (HCC)    Chronic HTN, controlled Secondary complication CKD-IV Followed by [facility] Dr KoJuleen ChinaCardilogy On ACEi low dose BB diuretics      Background diabetic retinopathy ([facility]   Last DM Eye exam 09/2018 - needs update in 2021 Background retinopathy not requiring treatment Followed by Optometry       Other Visit Diagnoses    Annual physical exam    -  Primary   Seasonal allergies       Relevant Medications   loratadine (CLARITIN) 10 MG tablet      Updated Health Maintenance information Reviewed recent lab results with patient Encouraged improvement to lifestyle with diet and exercise - Goal  of weight loss   Meds ordered this encounter  Medications  . loratadine (CLARITIN) 10 MG tablet    Sig: Take 1 tablet (10 mg total) by mouth daily.    Dispense:  90 tablet    Refill:  3      Follow up plan: Return in about 6 months (around 07/04/2020) for 6 month follow-up DM A1c, HTN.  Labs ordered today for physical  AlSheppard Coil  Parks Ranger, [facility] 01/04/2020, 9:22 AM

## 2020-01-04 NOTE — Assessment & Plan Note (Signed)
Chronic HTN, controlled Secondary complication CKD-IV Followed by [facility] Dr Juleen China, Cardilogy On ACEi low dose BB diuretics

## 2020-01-04 NOTE — Assessment & Plan Note (Signed)
Last DM Eye exam 09/2018 - needs update in 2021 Background retinopathy not requiring treatment Followed by Optometry

## 2020-01-05 LAB — TSH: TSH: 0.88 mIU/L (ref 0.40–4.50)

## 2020-01-05 LAB — HEMOGLOBIN A1C
Hgb A1c MFr Bld: 6 % of total Hgb — ABNORMAL HIGH (ref <5.7)
Mean Plasma Glucose: 126 mg/dL
eAG (mmol/L): 7 mmol/L

## 2020-01-05 LAB — LIPID PANEL
Cholesterol: 121 mg/dL (ref <200)
HDL: 61 mg/dL (ref >=40)
LDL Cholesterol (Calc): 34 mg/dL (calc)
Non-HDL Cholesterol (Calc): 60 mg/dL (calc) (ref <130)
Total CHOL/HDL Ratio: 2 (calc) (ref <5.0)
Triglycerides: 187 mg/dL — ABNORMAL HIGH (ref <150)

## 2020-01-05 LAB — PSA: PSA: 0.25 ng/mL (ref <=4.0)

## 2020-01-05 LAB — URIC ACID: Uric Acid, Serum: 5.3 mg/dL (ref 4.0–8.0)

## 2020-01-09 ENCOUNTER — Telehealth: Payer: Self-pay

## 2020-01-09 NOTE — Telephone Encounter (Signed)
Unable to reach the patient no VM set to let him know that we received it but once it has been resulted by provider will call him back.

## 2020-01-09 NOTE — Telephone Encounter (Signed)
Copied from [place] [id number]. Topic: General - Other >> Jan 09, 2020  2:54 PM Yvette Rack wrote: Reason for CRM: Pt requests that a nurse call him back to go over his most recent lab results.

## 2020-01-09 NOTE — Telephone Encounter (Signed)
Results are on mychart and in result note now.  [name], DO [facility] 01/09/2020, 6:18 PM

## 2020-01-10 NOTE — Telephone Encounter (Signed)
Result was notified to the patient by [facility].

## 2020-01-15 ENCOUNTER — Ambulatory Visit: Payer: PPO | Admitting: Pharmacist

## 2020-01-15 DIAGNOSIS — I129 Hypertensive chronic kidney disease with stage 1 through stage 4 chronic kidney disease, or unspecified chronic kidney disease: Secondary | ICD-10-CM

## 2020-01-15 DIAGNOSIS — N184 Chronic kidney disease, stage 4 (severe): Secondary | ICD-10-CM

## 2020-01-15 DIAGNOSIS — E1122 Type 2 diabetes mellitus with diabetic chronic kidney disease: Secondary | ICD-10-CM

## 2020-01-15 NOTE — Patient Instructions (Signed)
Thank you allowing the Chronic Care Management Team to be a part of your care! It was a pleasure speaking with you today!     CCM (Chronic Care Management) Team    Noreene Larsson RN, MSN, CCM Nurse Care Coordinator  [phone]   [name] PharmD  Clinical Pharmacist  [phone]   [name] LCSW Clinical Social Worker [phone]  Visit Information  Goals Addressed            This Visit's Progress   . PharmD - Medication Management       CARE PLAN ENTRY (see longitudinal plan of care for additional care plan information)  Current Barriers:  . Financial Barriers  . Lack of blood sugar or blood pressure results for the clinical team . Unable to keep appointments with CCM team  Pharmacist Clinical Goal(s):  [name] Kitchen Over the next 30 days, patient will work with CM Pharmacist to address needs related to medication therapy optimization  Interventions: . Perform chart review. Note patient seen by PCP on 12/9 for annual physical . Unable to review medications today. Patient is not currently home  Type 2 Diabetes . Controlled; taking Trulicity 0.25 mg weekly and Basaglar 10 units daily . Reports recent home CBGs ranging 100-120s  . Denies s/s low blood sugar . Reports having trouble with glucometer at home. Unable to confirm which glucometer he has been trying to use (not currently home). ? Note PCP sent Rx for One Touch Ultra 2 kit to pharmacy on 06/27/2019. Patient unable to confirm whether he picked up this meter ? Discuss importance of having working meter and monitoring home blood sugar ? Patient states that he will take current blood sugar meter(s) and supplies into local pharmacy for assistance and follow up with office if in need of additional Rx or support   Medication Assistance . Patient unable to receive assistance from HTA with patient assistance program re-enrollment for 2022 calendar year.  . Will collaborate with [facility] CPhT [name] for aid to patient  with applying for re-enrollment in [place] patient assistance program for Trulicity and Basaglar  Hypertension . Unable to review medications today; patient not currently home  . Again discuss with patient importance of/rational for monitoring home BP . Note patient has been provided with upper arm BP monitor, but denies monitoring . Patient followed by [facility] Dr Juleen China and [facility] Cardiology o Next appointment with Nephrology scheduled for 02/06/2020  Patient Self Care Activities:  . Self administers medications . Calls pharmacy for medication refills . Calls provider office for new concerns or questions  . Patient to check daily blood sugars, including fasting blood sugars, and keep log . Patient UNABLE to monitor home BP  Please see past updates related to this goal by clicking on the "Past Updates" button in the selected goal         The patient verbalized understanding of instructions, educational materials, and care plan provided today and declined offer to receive copy of patient instructions, educational materials, and care plan.   Telephone follow up appointment with care management team member scheduled for: 02/21/2020 at 9:30 am  [name], PharmD, [place] [phone]

## 2020-01-15 NOTE — Chronic Care Management (AMB) (Signed)
Chronic Care Management   Follow Up Note   01/15/2020 Name: [name] MRN: [id number] DOB: Jun 14, 1965  Referred by: [name], DO Reason for referral : Chronic Care Management (Patient Phone Call)   [name] is a 54 y.o. year old male who is a primary care patient of [name], DO. The CCM team was consulted for assistance with chronic disease management and care coordination needs.    I reached out to [name] by phone today.   Review of patient status, including review of consultants reports, relevant laboratory and other test results, and collaboration with appropriate care team members and the patient's provider was performed as part of comprehensive patient evaluation and provision of chronic care management services.    SDOH (Social Determinants of Health) assessments performed: No See Care Plan activities for detailed interventions related to SDOH)    Lab Results  Component Value Date   HGBA1C 6.0 (H) 01/04/2020   BP Readings from Last 3 Encounters:  01/04/20 (!) 155/97  06/27/19 132/84  03/08/18 107/76    Outpatient Encounter Medications as of 01/15/2020  Medication Sig Note  . Dulaglutide (TRULICITY) 7.57 VJ/2.8AS SOPN Inject 0.75 mg into the skin once a week. 07/20/2018: Taking on Fridays  . albuterol (PROVENTIL) (2.5 MG/3ML) 0.083% nebulizer solution Take 3 mLs (2.5 mg total) by nebulization every 6 (six) hours as needed for wheezing or shortness of breath.   [name] Kitchen albuterol (VENTOLIN HFA) 108 (90 Base) MCG/ACT inhaler Inhale 2 puffs into the lungs every 4 (four) hours as needed for wheezing or shortness of breath.   . allopurinol (ZYLOPRIM) 100 MG tablet Take 2 tablets (200 mg total) by mouth daily.   [name] Kitchen aspirin 81 MG chewable tablet Chew 81 mg by mouth daily.   [name] Kitchen atorvastatin (LIPITOR) 20 MG tablet Take 1 tablet (20 mg total) by mouth daily.   . blood glucose meter kit and supplies KIT Brand of choice; LON 99 months;  check FSBS 3x a day; E11.65; disp one meter with 100 strips + 1 refill of strips   . Blood Glucose Monitoring Suppl (ONE TOUCH ULTRA 2) w/Device KIT Use to check blood glucose up to 3 x daily   . bumetanide (BUMEX) 1 MG tablet Take 1 mg by mouth 2 (two) times daily. Per Dr. Juleen China 10/06/2018: Taking 2 mg (2 tablets) each morning and 1 mg (1 tablet) each afternoon (as last prescribed by Cardiology)  . calcitRIOL (ROCALTROL) 0.5 MCG capsule Take 0.5 mcg by mouth daily.   . carvedilol (COREG) 25 MG tablet Take 2 tablets (50 mg total) by mouth 2 (two) times daily with a meal.   . esomeprazole (NEXIUM) 40 MG capsule Take 1 capsule (40 mg total) by mouth daily before breakfast.   . hydrOXYzine (VISTARIL) 25 MG capsule Take 1 capsule (25 mg total) by mouth every 8 (eight) hours as needed for itching.   . Insulin Glargine (BASAGLAR KWIKPEN) 100 UNIT/ML SOPN Inject 10 Units into the skin daily. Submitted to [employer] for assistance   . Insulin Pen Needle (B-D UF III MINI PEN NEEDLES) 31G X 5 MM MISC 1 each by Does not apply route daily. Use once daily with Victoza.   . Lancets (ONETOUCH ULTRASOFT) lancets Check blood glucose up to 3 x daily   . lisinopril (ZESTRIL) 2.5 MG tablet Take 1 tablet (2.5 mg total) by mouth daily.   [name] Kitchen loratadine (CLARITIN) 10 MG tablet Take 1 tablet (10 mg total) by mouth  daily.   . ONE TOUCH ULTRA TEST test strip Check sugar 3 x daily   . simethicone (MYLICON) 841 MG chewable tablet Chew 125 mg by mouth every 6 (six) hours as needed for flatulence.   . traMADol (ULTRAM) 50 MG tablet Take 1-2 tablets (50-100 mg total) by mouth every 12 (twelve) hours as needed.   . vitamin C (ASCORBIC ACID) 500 MG tablet Take 1,000 mg by mouth daily.    No facility-administered encounter medications on file as of 01/15/2020.    Goals Addressed            This Visit's Progress   . PharmD - Medication Management       CARE PLAN ENTRY (see longitudinal plan of care for additional care plan  information)  Current Barriers:  . Financial Barriers  . Lack of blood sugar or blood pressure results for the clinical team . Unable to keep appointments with CCM team  Pharmacist Clinical Goal(s):  [name] Kitchen Over the next 30 days, patient will work with CM Pharmacist to address needs related to medication therapy optimization  Interventions: . Perform chart review. Note patient seen by PCP on 12/9 for annual physical . Unable to review medications today. Patient is not currently home  Type 2 Diabetes . Controlled; taking Trulicity 3.24 mg weekly and Basaglar 10 units daily . Reports recent home CBGs ranging 100-120s  . Denies s/s low blood sugar . Reports having trouble with glucometer at home. Unable to confirm which glucometer he has been trying to use (not currently home). ? Note PCP sent Rx for One Touch Ultra 2 kit to pharmacy on 06/27/2019. Patient unable to confirm whether he picked up this meter ? Discuss importance of having working meter and monitoring home blood sugar ? Patient states that he will take current blood sugar meter(s) and supplies into local pharmacy for assistance and follow up with office if in need of additional Rx or support   Medication Assistance . Patient unable to receive assistance from HTA with patient assistance program re-enrollment for 2022 calendar year.  . Will collaborate with [facility] CPhT [name] for aid to patient with applying for re-enrollment in [place] patient assistance program for Trulicity and Basaglar  Hypertension . Unable to review medications today; patient not currently home  . Again discuss with patient importance of/rational for monitoring home BP . Note patient has been provided with upper arm BP monitor, but denies monitoring . Patient followed by [facility] Dr Juleen China and [facility] Cardiology o Next appointment with Nephrology scheduled for 02/06/2020  Patient Self Care Activities:  . Self administers medications . Calls pharmacy for medication  refills . Calls provider office for new concerns or questions  . Patient to check daily blood sugars, including fasting blood sugars, and keep log . Patient UNABLE to monitor home BP  Please see past updates related to this goal by clicking on the "Past Updates" button in the selected goal         Plan  Telephone follow up appointment with care management team member scheduled for: 02/21/2020 at 9:30 am  [name], PharmD, [place] [phone]

## 2020-02-09 ENCOUNTER — Telehealth: Payer: Self-pay | Admitting: Family Medicine

## 2020-02-09 ENCOUNTER — Encounter: Payer: Self-pay | Admitting: Family Medicine

## 2020-02-09 ENCOUNTER — Ambulatory Visit (INDEPENDENT_AMBULATORY_CARE_PROVIDER_SITE_OTHER): Payer: PPO | Admitting: Family Medicine

## 2020-02-09 DIAGNOSIS — J0100 Acute maxillary sinusitis, unspecified: Secondary | ICD-10-CM | POA: Diagnosis not present

## 2020-02-09 MED ORDER — AZITHROMYCIN 250 MG PO TABS: ORAL_TABLET | ORAL | 0 refills | Status: DC

## 2020-02-09 NOTE — Telephone Encounter (Signed)
I called patient to make him a virutal appt for today.  His mailbox was full and was unable to contact patient.

## 2020-02-09 NOTE — Patient Instructions (Addendum)
1. Start Azithromycin Z pak (antibiotic) 2 tabs day 1, then 1 tab x 4 days, complete entire course even if improved 2. Start nasal steroid Flonase 2 sprays in each nostril daily for 4-6 weeks, may repeat course seasonally or as needed  Return criteria reviewed  may use topical Voltaren (diclofenac generic) OTC for joint pain instead of Aspercreme.  Please schedule a Follow-up Appointment to: Return if symptoms worsen or fail to improve.  If you have any other questions or concerns, please feel free to call the office or send a message through [place]. You may also schedule an earlier appointment if necessary.  Additionally, you may be receiving a survey about your experience at our office within a few days to 1 week by e-mail or mail. We value your feedback.  [name], DO [place]

## 2020-02-09 NOTE — Telephone Encounter (Signed)
Pt has been schedule for virtual appt today

## 2020-02-09 NOTE — Progress Notes (Signed)
Virtual Visit via Telephone The purpose of this virtual visit is to provide medical care while limiting exposure to the novel coronavirus (COVID19) for both patient and office staff.  Consent was obtained for phone visit:  Yes.   Answered questions that patient had about telehealth interaction:  Yes.   I discussed the limitations, risks, security and privacy concerns of performing an evaluation and management service by telephone. I also discussed with the patient that there may be a patient responsible charge related to this service. The patient expressed understanding and agreed to proceed.  Patient Location: Home Provider Location: [facility] (Office)  Participants in virtual visit: - Patient: [name] - CMA: Orinda Kenner, [place] - Provider: Dr Parks Ranger  ---------------------------------------------------------------------- Chief Complaint  Patient presents with  . sinus drainage  . Sore Throat    S: Reviewed CMA documentation. I have called patient and gathered additional HPI as follows:  Acute Sinusitis Reports that symptoms started >2 weeks ago, worsening sinus pain pressure congestion drainage, sore throat. - Tried OTC medications and Flonase - Not tested for COVID Not vaccinated Checks temperature, no fevers.  Additionally asks about other topical cream for joint pain  Denies any high risk travel to areas of current concern for COVID19. Denies any known or suspected exposure to person with or possibly with COVID19.  Denies any fevers, chills, sweats, body ache, cough, shortness of breath, headache, abdominal pain, diarrhea  Past Medical History:  Diagnosis Date  . Allergy   . Asthma   . Gout   . Hypertension   . Morbid obesity ([place])    Social History   Tobacco Use  . Smoking status: Never Smoker  . Smokeless tobacco: Never Used  Substance Use Topics  . Alcohol use: Yes    Comment: a glass of wine and a couple beers a day  .  Drug use: No    Current Outpatient Medications:  .  albuterol (PROVENTIL) (2.5 MG/3ML) 0.083% nebulizer solution, Take 3 mLs (2.5 mg total) by nebulization every 6 (six) hours as needed for wheezing or shortness of breath., Disp: 150 mL, Rfl: 1 .  albuterol (VENTOLIN HFA) 108 (90 Base) MCG/ACT inhaler, Inhale 2 puffs into the lungs every 4 (four) hours as needed for wheezing or shortness of breath., Disp: 18 g, Rfl: 3 .  allopurinol (ZYLOPRIM) 100 MG tablet, Take 2 tablets (200 mg total) by mouth daily., Disp: 180 tablet, Rfl: 0 .  aspirin 81 MG chewable tablet, Chew 81 mg by mouth daily., Disp: , Rfl:  .  atorvastatin (LIPITOR) 20 MG tablet, Take 1 tablet (20 mg total) by mouth daily., Disp: 90 tablet, Rfl: 0 .  azithromycin (ZITHROMAX Z-PAK) 250 MG tablet, Take 2 tabs (566m total) on Day 1. Take 1 tab (2580m daily for next 4 days., Disp: 6 tablet, Rfl: 0 .  blood glucose meter kit and supplies KIT, Brand of choice; LON 99 months; check FSBS 3x a day; E11.65; disp one meter with 100 strips + 1 refill of strips, Disp: 1 each, Rfl: 0 .  Blood Glucose Monitoring Suppl (ONE TOUCH ULTRA 2) w/Device KIT, Use to check blood glucose up to 3 x daily, Disp: 1 kit, Rfl: 0 .  bumetanide (BUMEX) 1 MG tablet, Take 1 mg by mouth 2 (two) times daily. Per Dr. KoJuleen ChinaDisp: , Rfl:  .  calcitRIOL (ROCALTROL) 0.5 MCG capsule, Take 0.5 mcg by mouth daily., Disp: , Rfl:  .  carvedilol (COREG) 25 MG tablet, Take  2 tablets (50 mg total) by mouth 2 (two) times daily with a meal., Disp: 360 tablet, Rfl: 1 .  Dulaglutide (TRULICITY) 2.63 FH/5.4TG SOPN, Inject 0.75 mg into the skin once a week., Disp: , Rfl:  .  esomeprazole (NEXIUM) 40 MG capsule, Take 1 capsule (40 mg total) by mouth daily before breakfast., Disp: 90 capsule, Rfl: 1 .  hydrOXYzine (VISTARIL) 25 MG capsule, Take 1 capsule (25 mg total) by mouth every 8 (eight) hours as needed for itching., Disp: 60 capsule, Rfl: 1 .  Insulin Glargine (BASAGLAR KWIKPEN)  100 UNIT/ML SOPN, Inject 10 Units into the skin daily. Submitted to [employer] for assistance, Disp: , Rfl:  .  Insulin Pen Needle (B-D UF III MINI PEN NEEDLES) 31G X 5 MM MISC, 1 each by Does not apply route daily. Use once daily with Victoza., Disp: 100 each, Rfl: 12 .  Lancets (ONETOUCH ULTRASOFT) lancets, Check blood glucose up to 3 x daily, Disp: 300 each, Rfl: 3 .  lisinopril (ZESTRIL) 2.5 MG tablet, Take 1 tablet (2.5 mg total) by mouth daily., Disp: 90 tablet, Rfl: 0 .  loratadine (CLARITIN) 10 MG tablet, Take 1 tablet (10 mg total) by mouth daily., Disp: 90 tablet, Rfl: 3 .  ONE TOUCH ULTRA TEST test strip, Check sugar 3 x daily, Disp: 300 each, Rfl: 3 .  simethicone (MYLICON) 256 MG chewable tablet, Chew 125 mg by mouth every 6 (six) hours as needed for flatulence., Disp: , Rfl:  .  traMADol (ULTRAM) 50 MG tablet, Take 1-2 tablets (50-100 mg total) by mouth every 12 (twelve) hours as needed., Disp: 30 tablet, Rfl: 2 .  vitamin C (ASCORBIC ACID) 500 MG tablet, Take 1,000 mg by mouth daily., Disp: , Rfl:   Depression screen [facility] 2/9 01/04/2020 06/27/2019 05/18/2019  Decreased Interest 0 0 0  Down, Depressed, Hopeless 0 0 0  PHQ - 2 Score 0 0 0  Altered sleeping 0 - -  Tired, decreased energy 0 - -  Change in appetite 0 - -  Feeling bad or failure about yourself  0 - -  Trouble concentrating 0 - -  Moving slowly or fidgety/restless 0 - -  Suicidal thoughts 0 - -  PHQ-9 Score 0 - -  Difficult doing work/chores Not difficult at all - -    GAD 7 : Generalized Anxiety Score 01/04/2020  Nervous, Anxious, on Edge 0  Control/stop worrying 0  Worry too much - different things 0  Trouble relaxing 0  Restless 0  Easily annoyed or irritable 0  Afraid - awful might happen 0  Total GAD 7 Score 0  Anxiety Difficulty Not difficult at all    -------------------------------------------------------------------------- O: No physical exam performed due to remote telephone encounter.  Lab  results reviewed.  Recent Results (from the past 2160 hour(s))  Uric acid     Status: None   Collection Time: 01/04/20  9:35 AM  Result Value Ref Range   Uric Acid, Serum 5.3 4.0 - 8.0 mg/dL    Comment: Therapeutic target for gout patients: <6.0 mg/dL .   TSH     Status: None   Collection Time: 01/04/20  9:35 AM  Result Value Ref Range   TSH 0.88 0.40 - 4.50 mIU/L  PSA     Status: None   Collection Time: 01/04/20  9:35 AM  Result Value Ref Range   PSA 0.25 < OR = 4.0 ng/mL    Comment: The total PSA value from this assay system  is  standardized against the WHO standard. The test  result will be approximately 20% lower when compared  to the equimolar-standardized total PSA (Beckman  Coulter). Comparison of serial PSA results should be  interpreted with this fact in mind. . This test was performed using the Siemens  chemiluminescent method. Values obtained from  different assay methods cannot be used interchangeably. PSA levels, regardless of value, should not be interpreted as absolute evidence of the presence or absence of disease.   Lipid panel     Status: Abnormal   Collection Time: 01/04/20  9:35 AM  Result Value Ref Range   Cholesterol 121 <200 mg/dL   HDL 61 > OR = 40 mg/dL   Triglycerides 187 (H) <150 mg/dL   LDL Cholesterol (Calc) 34 mg/dL (calc)    Comment: Reference range: <100 . Desirable range <100 mg/dL for primary prevention;   <70 mg/dL for patients with CHD or diabetic patients  with > or = 2 CHD risk factors. [name] Kitchen LDL-C is now calculated using the Martin-Hopkins  calculation, which is a validated novel method providing  better accuracy than the Friedewald equation in the  estimation of LDL-C.  Cresenciano Genre et al. Annamaria Helling. 8242;353(61): 2061-2068  (http://education.[employer].com/faq/FAQ164)    Total CHOL/HDL Ratio 2.0 <5.0 (calc)   Non-HDL Cholesterol (Calc) 60 <130 mg/dL (calc)    Comment: For patients with diabetes plus 1 major ASCVD risk  factor,  treating to a non-HDL-C goal of <100 mg/dL  (LDL-C of <70 mg/dL) is considered a therapeutic  option.   Hemoglobin A1c     Status: Abnormal   Collection Time: 01/04/20  9:35 AM  Result Value Ref Range   Hgb A1c MFr Bld 6.0 (H) <5.7 % of total Hgb    Comment: For someone without known diabetes, a hemoglobin  A1c value between 5.7% and 6.4% is consistent with prediabetes and should be confirmed with a  follow-up test. . For someone with known diabetes, a value <7% indicates that their diabetes is well controlled. A1c targets should be individualized based on duration of diabetes, age, comorbid conditions, and other considerations. . This assay result is consistent with an increased risk of diabetes. . Currently, no consensus exists regarding use of hemoglobin A1c for diagnosis of diabetes for children. .    Mean Plasma Glucose 126 mg/dL   eAG (mmol/L) 7.0 mmol/L    -------------------------------------------------------------------------- A&P:  Problem List Items Addressed This Visit   None   Visit Diagnoses    Acute non-recurrent maxillary sinusitis    -  Primary   Relevant Medications   azithromycin (ZITHROMAX Z-PAK) 250 MG tablet     Consistent with acute maxillary rhinosinusitis, likely initially viral URI allergic rhinitis component with worsening concern for bacterial infection. > 2 week 2nd sickening now. Previously improved w/ Zpak  Unvaccinated to covid. No exposure. No recent testing. Afebrile. He declines test.  Plan: 1. Start Azithromycin Z pak (antibiotic) 2 tabs day 1, then 1 tab x 4 days, complete entire course even if improved 2. Start nasal steroid Flonase 2 sprays in each nostril daily for 4-6 weeks, may repeat course seasonally or as needed  Return criteria reviewed  #He may use topical Voltaren OTC for joint pain instead of Aspercreme.   Meds ordered this encounter  Medications  . azithromycin (ZITHROMAX Z-PAK) 250 MG tablet    Sig: Take 2  tabs (546m total) on Day 1. Take 1 tab (2564m daily for next 4 days.    Dispense:  6  tablet    Refill:  0    Follow-up: - Return as needed  Patient verbalizes understanding with the above medical recommendations including the limitation of remote medical advice.  Specific follow-up and call-back criteria were given for patient to follow-up or seek medical care more urgently if needed.   - Time spent in direct consultation with patient on phone: 7 minutes   [name], [facility] 02/09/2020, 3:19 PM

## 2020-02-09 NOTE — Telephone Encounter (Signed)
Pt called asking for Provider to send in a Z pack to the pharmacy for a sinus infection/ pt stated this is done every year / please advise Pt asked for it to be sent to  [place], [place] Phone:  [phone]  Fax:  [phone]

## 2020-02-13 ENCOUNTER — Ambulatory Visit: Payer: PPO

## 2020-02-13 ENCOUNTER — Telehealth: Payer: Self-pay

## 2020-02-13 NOTE — Telephone Encounter (Signed)
This nurse called patient 3 times at 0855, 0900, and 0910 for scheduled AWV. The voicemail was not accepting messages.

## 2020-02-13 NOTE — Telephone Encounter (Signed)
Pt returning call. Please advise. °

## 2020-02-19 ENCOUNTER — Other Ambulatory Visit: Payer: Self-pay | Admitting: Family Medicine

## 2020-02-19 DIAGNOSIS — N184 Chronic kidney disease, stage 4 (severe): Secondary | ICD-10-CM

## 2020-02-19 DIAGNOSIS — I129 Hypertensive chronic kidney disease with stage 1 through stage 4 chronic kidney disease, or unspecified chronic kidney disease: Secondary | ICD-10-CM

## 2020-02-21 ENCOUNTER — Telehealth: Payer: Self-pay

## 2020-02-21 ENCOUNTER — Telehealth: Payer: Self-pay | Admitting: Pharmacist

## 2020-02-21 ENCOUNTER — Other Ambulatory Visit: Payer: Self-pay | Admitting: Family Medicine

## 2020-02-21 DIAGNOSIS — R0602 Shortness of breath: Secondary | ICD-10-CM

## 2020-02-21 DIAGNOSIS — E875 Hyperkalemia: Secondary | ICD-10-CM | POA: Diagnosis not present

## 2020-02-21 DIAGNOSIS — E1122 Type 2 diabetes mellitus with diabetic chronic kidney disease: Secondary | ICD-10-CM | POA: Diagnosis not present

## 2020-02-21 DIAGNOSIS — N184 Chronic kidney disease, stage 4 (severe): Secondary | ICD-10-CM | POA: Diagnosis not present

## 2020-02-21 DIAGNOSIS — R809 Proteinuria, unspecified: Secondary | ICD-10-CM | POA: Diagnosis not present

## 2020-02-21 DIAGNOSIS — N2581 Secondary hyperparathyroidism of renal origin: Secondary | ICD-10-CM | POA: Diagnosis not present

## 2020-02-21 DIAGNOSIS — I129 Hypertensive chronic kidney disease with stage 1 through stage 4 chronic kidney disease, or unspecified chronic kidney disease: Secondary | ICD-10-CM | POA: Diagnosis not present

## 2020-02-21 NOTE — Telephone Encounter (Signed)
Requested medication (s) are due for refill today: expired medication  Requested medication (s) are on the active medication list: yes   Last refill:  03/11/2018 #173m 1 refill  Future visit scheduled: yes   Notes to clinic:  expired medication     Requested Prescriptions  Pending Prescriptions Disp Refills   albuterol (PROVENTIL) (2.5 MG/3ML) 0.083% nebulizer solution [Pharmacy Med Name: ALBUTEROL SUL 2.5 MG/3 ML SOLN] 150 mL 0    Sig: Take 3 mLs (2.5 mg total) by nebulization every 6 (six) hours as needed for wheezing or shortness of breath.      Pulmonology:  Beta Agonists Failed - 02/21/2020  8:49 AM      Failed - One inhaler should last at least one month. If the patient is requesting refills earlier, contact the patient to check for uncontrolled symptoms.      Passed - Valid encounter within last 12 months    Recent Outpatient Visits           1 week ago Acute non-recurrent maxillary sinusitis   [place]   1 month ago [facility]   7 months ago Controlled type 2 diabetes mellitus with stage 4 chronic kidney disease, with long-term current use of insulin ([facility]   [facility]   8 months ago Acute non-recurrent frontal sinusitis   [facility]   1 year ago Controlled type 2 diabetes mellitus with stage 4 chronic kidney disease, with long-term current use of insulin ([place]   [facility]       Future Appointments             In 4 months [place] [facility] [place]  In 12 months  [facility] [place]

## 2020-02-21 NOTE — Telephone Encounter (Signed)
  Chronic Care Management   Outreach Note  02/21/2020 Name: [name] MRN: [id number] DOB: 1965-09-02  Referred by: [name], DO Reason for referral : No chief complaint on file.  Was unable to reach patient via telephone today and have left HIPAA compliant voicemail asking patient to return my call.   Follow Up Plan: Will collaborate with Care Guide to outreach to schedule follow up with me  [name], PharmD, [place] [facility] [phone]

## 2020-02-26 ENCOUNTER — Telehealth: Payer: Self-pay

## 2020-02-26 NOTE — Chronic Care Management (AMB) (Signed)
  Care Management   Note  02/26/2020 Name: [name] MRN: [id number] DOB: May 14, 1965  [name] is a 55 y.o. year old male who is a primary care patient of [name], DO and is actively engaged with the care management team. I reached out to [name] by phone today to assist with re-scheduling a follow up visit with the Pharmacist  Follow up plan: Unsuccessful telephone outreach attempt made. A HIPAA compliant phone message was left for the patient providing contact information and requesting a return call.  The care management team will reach out to the patient again over the next 7 days.  If patient returns call to provider office, please advise to call [place]  at [place], [place], [place], [place] [address] Direct Dial: [phone] Mats [email or web address]: [email or web address]

## 2020-02-26 NOTE — Chronic Care Management (AMB) (Signed)
  Care Management   Note  02/26/2020 Name: [name] MRN: [id number] DOB: 31-Mar-1965  [name] is a 55 y.o. year old male who is a primary care patient of [name], DO and is actively engaged with the care management team. I reached out to [name] by phone today to assist with re-scheduling a follow up visit with the Pharmacist  Follow up plan: Telephone appointment with care management team member scheduled for:03/06/2020  Noreene Larsson, [place], [place], [place] [address] Direct Dial: [phone] [email or web address]: [email or web address]

## 2020-02-27 ENCOUNTER — Other Ambulatory Visit: Payer: Self-pay | Admitting: Family Medicine

## 2020-02-27 DIAGNOSIS — N2581 Secondary hyperparathyroidism of renal origin: Secondary | ICD-10-CM | POA: Diagnosis not present

## 2020-02-27 DIAGNOSIS — E1122 Type 2 diabetes mellitus with diabetic chronic kidney disease: Secondary | ICD-10-CM | POA: Diagnosis not present

## 2020-02-27 DIAGNOSIS — E875 Hyperkalemia: Secondary | ICD-10-CM | POA: Diagnosis not present

## 2020-02-27 DIAGNOSIS — M8949 Other hypertrophic osteoarthropathy, multiple sites: Secondary | ICD-10-CM

## 2020-02-27 DIAGNOSIS — R809 Proteinuria, unspecified: Secondary | ICD-10-CM | POA: Diagnosis not present

## 2020-02-27 DIAGNOSIS — G894 Chronic pain syndrome: Secondary | ICD-10-CM

## 2020-02-27 DIAGNOSIS — M159 Polyosteoarthritis, unspecified: Secondary | ICD-10-CM

## 2020-02-27 DIAGNOSIS — I129 Hypertensive chronic kidney disease with stage 1 through stage 4 chronic kidney disease, or unspecified chronic kidney disease: Secondary | ICD-10-CM | POA: Diagnosis not present

## 2020-02-27 DIAGNOSIS — N184 Chronic kidney disease, stage 4 (severe): Secondary | ICD-10-CM | POA: Diagnosis not present

## 2020-02-27 NOTE — Telephone Encounter (Signed)
Requested medication (s) are due for refill today: no  Requested medication (s) are on the active medication list: yes  Last refill:  01/23/2020  Future visit scheduled:yes   Notes to clinic:  this refill cannot be delegated   Requested Prescriptions  Pending Prescriptions Disp Refills   traMADol (ULTRAM) 50 MG tablet [Pharmacy Med Name: TRAMADOL HCL 50 MG TABLET] 30 tablet 0    Sig: Take 1-2 tablets (50-100 mg total) by mouth every 12 (twelve) hours as needed.      Not Delegated - Analgesics:  Opioid Agonists Failed - 02/27/2020  3:00 PM      Failed - This refill cannot be delegated      Failed - Urine Drug Screen completed in last 360 days      Passed - Valid encounter within last 6 months    Recent Outpatient Visits           2 weeks ago Acute non-recurrent maxillary sinusitis   [place], DO   1 month ago Annual physical exam   [facility] [name], DO   8 months ago Controlled type 2 diabetes mellitus with stage 4 chronic kidney disease, with long-term current use of insulin [facility])   [facility] [place], [name], DO   8 months ago Acute non-recurrent frontal sinusitis   [facility] [place], [name], DO   1 year ago Controlled type 2 diabetes mellitus with stage 4 chronic kidney disease, with long-term current use of insulin ([place])   [facility] [place], [name], DO       Future Appointments             In 4 months [place], [name], DO [facility], [place]   In 11 months  [facility]

## 2020-02-29 ENCOUNTER — Telehealth: Payer: Self-pay

## 2020-02-29 ENCOUNTER — Other Ambulatory Visit: Payer: Self-pay

## 2020-02-29 DIAGNOSIS — M8949 Other hypertrophic osteoarthropathy, multiple sites: Secondary | ICD-10-CM

## 2020-02-29 DIAGNOSIS — G894 Chronic pain syndrome: Secondary | ICD-10-CM

## 2020-02-29 DIAGNOSIS — M159 Polyosteoarthritis, unspecified: Secondary | ICD-10-CM

## 2020-02-29 NOTE — Telephone Encounter (Signed)
Copied from [place] [id number]. Topic: General - Other >> Feb 29, 2020  1:51 PM [name] wrote: Reason for CRM: Patient would like the nurse to call him regarding his medication.  He stated it was refused and the pharmacy would not fill it and he does not know why.  Please call to discuss at [phone]

## 2020-02-29 NOTE — Telephone Encounter (Signed)
Pt states he takes this medication for neuropathy.  Pt states he cannot take tylenol, so Dr Raliegh Ip prescribes the tramadol  . Pt states he has multiple joint pain. Pt states everyday he is in pain. Pt states Dr Raliegh Ip always prescribes for him when he calls. Pt states he hurts all over, ever day is different. Pt states he is completely out as well.

## 2020-02-29 NOTE — Telephone Encounter (Signed)
I attempted to contact the patient, no answer. LMOM to return my call.

## 2020-02-29 NOTE — Telephone Encounter (Signed)
The pt called with concerns with why his Tramadol was denied. I explained to him Dr. Raliegh Ip was out of the office until 03/06/20. He verbalize understanding, no questions or concerns.

## 2020-03-06 ENCOUNTER — Other Ambulatory Visit: Payer: Self-pay | Admitting: Family Medicine

## 2020-03-06 ENCOUNTER — Ambulatory Visit (INDEPENDENT_AMBULATORY_CARE_PROVIDER_SITE_OTHER): Payer: PPO | Admitting: Pharmacist

## 2020-03-06 DIAGNOSIS — E1122 Type 2 diabetes mellitus with diabetic chronic kidney disease: Secondary | ICD-10-CM

## 2020-03-06 DIAGNOSIS — M159 Polyosteoarthritis, unspecified: Secondary | ICD-10-CM

## 2020-03-06 DIAGNOSIS — N184 Chronic kidney disease, stage 4 (severe): Secondary | ICD-10-CM

## 2020-03-06 DIAGNOSIS — G894 Chronic pain syndrome: Secondary | ICD-10-CM

## 2020-03-06 DIAGNOSIS — I129 Hypertensive chronic kidney disease with stage 1 through stage 4 chronic kidney disease, or unspecified chronic kidney disease: Secondary | ICD-10-CM

## 2020-03-06 DIAGNOSIS — M8949 Other hypertrophic osteoarthropathy, multiple sites: Secondary | ICD-10-CM

## 2020-03-06 NOTE — Addendum Note (Signed)
Addended by: Wilson Singer on: 03/06/2020 08:43 AM   Modules accepted: Orders

## 2020-03-06 NOTE — Telephone Encounter (Signed)
Requested medication (s) are due for refill today: no  Requested medication (s) are on the active medication list: yes  Last refill:  01/23/2020  Future visit scheduled:yes  Notes to clinic:  this refill cannot be delegated    Requested Prescriptions  Pending Prescriptions Disp Refills   traMADol (ULTRAM) 50 MG tablet [Pharmacy Med Name: TRAMADOL HCL 50 MG TABLET] 30 tablet 0    Sig: Take 1-2 tablets (50-100 mg total) by mouth every 12 (twelve) hours as needed.      Not Delegated - Analgesics:  Opioid Agonists Failed - 03/06/2020  8:40 AM      Failed - This refill cannot be delegated      Failed - Urine Drug Screen completed in last 360 days      Passed - Valid encounter within last 6 months    Recent Outpatient Visits           3 weeks ago Acute non-recurrent maxillary sinusitis   [place], DO   2 months ago Annual physical exam   [facility] [name], DO   8 months ago Controlled type 2 diabetes mellitus with stage 4 chronic kidney disease, with long-term current use of insulin [facility])   [facility] [name], DO   9 months ago Acute non-recurrent frontal sinusitis   [facility] [place], [name], DO   1 year ago Controlled type 2 diabetes mellitus with stage 4 chronic kidney disease, with long-term current use of insulin ([place])   [facility] [place], [name], DO       Future Appointments             In 4 months Parks Ranger, [name], DO [facility], [place]   In 11 months  [facility], [facility]

## 2020-03-06 NOTE — Addendum Note (Signed)
Addended by: [name] on: 03/06/2020 10:53 AM   Modules accepted: Orders

## 2020-03-06 NOTE — Patient Instructions (Signed)
Visit Information  PATIENT GOALS: Goals Addressed   Please be sure to have your medication bottles, blood sugar log and blood pressure log with you for our next appointment.  Thank you!     The patient verbalized understanding of instructions, educational materials, and care plan provided today and declined offer to receive copy of patient instructions, educational materials, and care plan.   Telephone follow up appointment with care management team member scheduled for: 04/03/2020 at 8:30 AM  [name], PharmD, [place] [phone]

## 2020-03-06 NOTE — Chronic Care Management (AMB) (Signed)
Chronic Care Management Pharmacy Note  03/06/2020 Name:  SEVIN LANGENBACH MRN:  [id number] DOB:  [birth year]  Subjective: [name] is an 55 y.o. year old male who is a primary patient of [name], DO.  The CCM team was consulted for assistance with disease management and care coordination needs.    Engaged with patient by telephone for follow up visit in response to provider referral for pharmacy case management and/or care coordination services.   Consent to Services:  The patient was given information about Chronic Care Management services, agreed to services, and gave verbal consent prior to initiation of services.  Please see initial visit note for detailed documentation.   Objective:  Lab Results  Component Value Date   CREATININE 1.83 (H) 10/05/2018   CREATININE 2.21 (H) 03/05/2018   CREATININE 3.07 (H) 03/04/2018    Lab Results  Component Value Date   HGBA1C 6.0 (H) 01/04/2020       Component Value Date/Time   CHOL 121 01/04/2020 0935   CHOL 105 01/17/2015 1120   CHOL 110 08/07/2014 0848   TRIG 187 (H) 01/04/2020 0935   TRIG 159 (H) 08/07/2014 0848   HDL 61 01/04/2020 0935   HDL 48 01/17/2015 1120   CHOLHDL 2.0 01/04/2020 0935   VLDL 11 08/06/2015 0937   VLDL 32 (H) 08/07/2014 0848   LDLCALC 34 01/04/2020 0935    BP Readings from Last 3 Encounters:  01/04/20 (!) 155/97  06/27/19 132/84  03/08/18 107/76    Assessment: Review of patient past medical history, allergies, medications, health status, including review of consultants reports, laboratory and other test data, was performed as part of comprehensive evaluation and provision of chronic care management services.   SDOH:  (Social Determinants of Health) assessments and interventions performed: none   CCM Care Plan  Allergies  Allergen Reactions  . Talc     Medications Reviewed Today    Reviewed by [name], DO (Physician) on 02/09/20 at [place] [facility]  Status: <None>  Medication Order Taking? Sig Documenting Provider Last Dose Status Informant  albuterol (PROVENTIL) (2.5 MG/3ML) 0.083% nebulizer solution [id number] Yes Take 3 mLs (2.5 mg total) by nebulization every 6 (six) hours as needed for wheezing or shortness of breath. [name], DO Taking Active   albuterol (VENTOLIN HFA) 108 (90 Base) MCG/ACT inhaler [id number] Yes Inhale 2 puffs into the lungs every 4 (four) hours as needed for wheezing or shortness of breath. [name], DO Taking Active   allopurinol (ZYLOPRIM) 100 MG tablet [id number] Yes Take 2 tablets (200 mg total) by mouth daily. [name], DO Taking Active   aspirin 81 MG chewable tablet [id number] Yes Chew 81 mg by mouth daily. [provider] Taking Active Self  atorvastatin (LIPITOR) 20 MG tablet [id number] Yes Take 1 tablet (20 mg total) by mouth daily. [name], DO Taking Active   blood glucose meter kit and supplies KIT [id number] Yes Brand of choice; LON 99 months; check FSBS 3x a day; E11.65; disp one meter with 100 strips + 1 refill of strips [name], [name], MD Taking Active Self  Blood Glucose Monitoring Suppl (ONE TOUCH ULTRA 2) w/Device KIT [id number] Yes Use to check blood glucose up to 3 x daily [name], DO Taking Active   bumetanide (BUMEX) 1 MG tablet [id number] Yes Take 1 mg by mouth 2 (two) times daily. Per Dr. Juleen China [provider] Taking Active Self  Med Note (Donna Snooks A   Thu Oct 06, 2018  3:14 PM) Taking 2 mg (2 tablets) each morning and 1 mg (1 tablet) each afternoon (as last prescribed by Cardiology)  calcitRIOL (ROCALTROL) 0.5 MCG capsule [id number] Yes Take 0.5 mcg by mouth daily. [provider] Taking Active   carvedilol (COREG) 25 MG tablet [id number] Yes Take 2 tablets (50 mg total) by mouth 2 (two) times daily with a meal. [name], [name], DO Taking Active   Dulaglutide  (TRULICITY) 1.09 NA/3.5TD SOPN [id number] Yes Inject 0.75 mg into the skin once a week. [provider] Taking Active            Med Note [name], Konni Kesinger A   Wed Jul 20, 2018  5:42 PM) Taking on Fridays  esomeprazole (NEXIUM) 40 MG capsule [id number] Yes Take 1 capsule (40 mg total) by mouth daily before breakfast. [name], DO Taking Active   hydrOXYzine (VISTARIL) 25 MG capsule [id number] Yes Take 1 capsule (25 mg total) by mouth every 8 (eight) hours as needed for itching. [name], DO Taking Active   Insulin Glargine [facility]) 100 UNIT/ML SOPN [id number] Yes Inject 10 Units into the skin daily. Submitted to [employer] for assistance [name], DO Taking Active Self           Med Note [name], Viana Sleep A   Mon Jan 15, 2020 10:34 AM)    Insulin Pen Needle (B-D UF III MINI PEN NEEDLES) 31G X 5 MM MISC [id number] Yes 1 each by Does not apply route daily. Use once daily with Victoza. [name], DO Taking Active Self  Lancets [facility] ULTRASOFT) lancets [id number] Yes Check blood glucose up to 3 x daily [name], DO Taking Active   lisinopril (ZESTRIL) 2.5 MG tablet [id number] Yes Take 1 tablet (2.5 mg total) by mouth daily. [name], DO Taking Active   loratadine (CLARITIN) 10 MG tablet [id number] Yes Take 1 tablet (10 mg total) by mouth daily. [name], DO Taking Active   ONE TOUCH ULTRA TEST test strip [id number] Yes Check sugar 3 x daily [name], DO Taking Active   simethicone (MYLICON) 242 MG chewable tablet [id number] Yes Chew 125 mg by mouth every 6 (six) hours as needed for flatulence. [provider] Taking Active   traMADol (ULTRAM) 50 MG tablet [id number] Yes Take 1-2 tablets (50-100 mg total) by mouth every 12 (twelve) hours as needed. [name], DO Taking Active   vitamin C (ASCORBIC ACID) 500 MG tablet [id number] Yes Take 1,000  mg by mouth daily. [provider] Taking Active Self           Med Note [name], [name]   Tue Apr 09, 2015  8:28 AM)            Patient Active Problem List   Diagnosis Date Noted  . Background diabetic retinopathy ([place]) 10/17/2018  . Chronic pain syndrome 01/21/2017  . Chronic restrictive lung disease 02/19/2016  . Primary osteoarthritis involving multiple joints 01/24/2016  . Asthma 09/03/2015  . Elevated transaminase level 06/04/2015  . Excessive daytime sleepiness 05/07/2015  . GERD (gastroesophageal reflux disease) 05/07/2015  . Noncompliance 04/09/2015  . Allergic rhinitis 04/09/2015  . Hyperlipidemia associated with type 2 diabetes mellitus ([place]) 02/15/2015  . Vitamin D deficiency 01/17/2015  . Elevated CO2 level 01/17/2015  . Tobacco abuse, episodic 01/17/2015  . Insomnia 01/17/2015  . Current tobacco use 01/11/2015  . Benign  hypertension with CKD (chronic kidney disease) stage IV ([place]) 07/10/2014  . Proteinuria 07/10/2014  . Type 2 diabetes mellitus, controlled, with renal complications ([place]) 16/10/9602  . CKD (chronic kidney disease), stage IV ([place]) 07/10/2014  . Secondary renal hyperparathyroidism ([place]) 07/10/2014  . Cardiomyopathy, dilated, nonischemic ([place])   . Morbid obesity with BMI of 45.0-49.9, adult ([place])   . Gout   . Chronic systolic heart failure ([place]) 04/29/2011    Conditions to be addressed/monitored: HTN, HLD and DMII  Care Plan : PharmD - Med Assistance  Updates made by [name], RPH since 03/06/2020 12:00 AM    Problem: Disease Progression     Long-Range Goal: Disease Progression Prevented or Minimized   Start Date: 03/06/2020  Expected End Date: 06/04/2020  This Visit's Progress: On track  Priority: High  Note:   Current Barriers:  . Financial Barriers  . Lack of blood sugar or blood pressure results for the clinical team . Unable to keep appointments with CCM team  Pharmacist Clinical Goal(s):  [name] Kitchen Over the next 90 days,  patient will achieve adherence to monitoring guidelines and medication adherence to achieve therapeutic efficacy through collaboration with PharmD and provider.   Interventions: . 1:1 collaboration with [name], DO regarding development and update of comprehensive plan of care as evidenced by provider attestation and co-signature . Inter-disciplinary care team collaboration (see longitudinal plan of care) . Perform chart review. Note patient seen by Nephrologist on 2/1. Patient advised by provider to: o Schedule Kidney Education Class o Increase calcitriol to 68m daily . Today patient reports scheduled Kidney Education class and taking calcitriol as directed by Nephrologist . Unable to complete medication review today as patient not currently home; schedule time with patient.  Type 2 Diabetes . Controlled; taking Trulicity 05.40mg weekly and Basaglar 10 units daily . Reports recent home CBGs ranging 80s-120s  . Denies s/s low blood sugar . Reports again having trouble with glucometer at home. Unable to confirm which glucometer he has been trying to use (not currently home). o Have discussed importance of having working meter and monitoring home blood sugar o Patient states that he will take current blood sugar meter(s) and supplies into local pharmacy for assistance and follow up with office if in need of additional Rx or support   Medication Assistance . Per notes from [facility] patient and provider portion of [name] faxed to program on 2/1 . Will continue to collaborate with [place] for aid to patient with applying for re[place]en[place]ment in [place] for Trulicity and Basaglar  Hypertension . Unable to review medications today; patient not currently home  . Again discuss with patient importance of/rational for monitoring home BP . Note patient has been provided with upper arm BP monitor, but denies  monitoring . [facility]  Patient Goals/Self-Care Activities . Over the next 90 days, patient will:  - check glucose, document, and provide at future appointments - collaborate with provider on medication access solutions  Follow Up Plan: Telephone follow up appointment with care management team member scheduled for: 04/03/2020 at 8:30 AM      Medication Assistance: Application for [employer]  medication assistance program. in process.  See plan of care for additional detail.  Follow Up:  Patient agrees to Care Plan and Follow-up.  EHarlow Asa PharmD, [phone]

## 2020-03-06 NOTE — Telephone Encounter (Signed)
Pt has called in wanting to speak to Dr Raliegh Ip,  nurse, knows Dr Raliegh Ip is back today and wants to remind that he needs his Tramadol. I told him I would relay the message and reminded him of his Med Management appt today.

## 2020-03-12 ENCOUNTER — Telehealth: Payer: Self-pay | Admitting: Family Medicine

## 2020-03-12 NOTE — Telephone Encounter (Signed)
Attempted to reach Patient to notify him his medications are ready to be picked up.

## 2020-03-20 ENCOUNTER — Telehealth: Payer: Self-pay

## 2020-03-20 ENCOUNTER — Ambulatory Visit: Payer: Self-pay | Admitting: Pharmacist

## 2020-03-20 ENCOUNTER — Telehealth: Payer: Self-pay | Admitting: *Deleted

## 2020-03-20 DIAGNOSIS — E1122 Type 2 diabetes mellitus with diabetic chronic kidney disease: Secondary | ICD-10-CM | POA: Diagnosis not present

## 2020-03-20 DIAGNOSIS — I129 Hypertensive chronic kidney disease with stage 1 through stage 4 chronic kidney disease, or unspecified chronic kidney disease: Secondary | ICD-10-CM | POA: Diagnosis not present

## 2020-03-20 DIAGNOSIS — N184 Chronic kidney disease, stage 4 (severe): Secondary | ICD-10-CM | POA: Diagnosis not present

## 2020-03-20 DIAGNOSIS — Z794 Long term (current) use of insulin: Secondary | ICD-10-CM | POA: Diagnosis not present

## 2020-03-20 NOTE — Chronic Care Management (AMB) (Signed)
Chronic Care Management Pharmacy Note  03/20/2020 Name:  [name] MRN:  [id number] DOB:  12-16-1965  Subjective: [name] is an 55 y.o. year old male who is a primary patient of [name], DO.  The CCM team was consulted for assistance with disease management and care coordination needs.    Engaged with patient by telephone for follow up visit in response to provider referral for pharmacy case management and/or care coordination services.   Consent to Services:  The patient was given information about Chronic Care Management services, agreed to services, and gave verbal consent prior to initiation of services.  Please see initial visit note for detailed documentation.    Assessment: Review of patient past medical history, allergies, medications, health status, including review of consultants reports, laboratory and other test data, was performed as part of comprehensive evaluation and provision of chronic care management services.   SDOH:  (Social Determinants of Health) assessments and interventions performed: none   CCM Care Plan  Allergies  Allergen Reactions  . Talc     Medications Reviewed Today    Reviewed by [name], DO (Physician) on 02/09/20 at [place] List Status: <None>  Medication Order Taking? Sig Documenting Provider Last Dose Status Informant  albuterol (PROVENTIL) (2.5 MG/3ML) 0.083% nebulizer solution [id number] Yes Take 3 mLs (2.5 mg total) by nebulization every 6 (six) hours as needed for wheezing or shortness of breath. [name], DO Taking Active   albuterol (VENTOLIN HFA) 108 (90 Base) MCG/ACT inhaler [id number] Yes Inhale 2 puffs into the lungs every 4 (four) hours as needed for wheezing or shortness of breath. [name], DO Taking Active   allopurinol (ZYLOPRIM) 100 MG tablet [id number] Yes Take 2 tablets (200 mg total) by mouth daily. [name], DO Taking Active    aspirin 81 MG chewable tablet [id number] Yes Chew 81 mg by mouth daily. [provider] Taking Active Self  atorvastatin (LIPITOR) 20 MG tablet [id number] Yes Take 1 tablet (20 mg total) by mouth daily. [name], DO Taking Active   blood glucose meter kit and supplies KIT [id number] Yes Brand of choice; LON 99 months; check FSBS 3x a day; E11.65; disp one meter with 100 strips + 1 refill of strips [name], [name], MD Taking Active Self  Blood Glucose Monitoring Suppl (ONE TOUCH ULTRA 2) w/Device KIT [id number] Yes Use to check blood glucose up to 3 x daily [name], DO Taking Active   bumetanide (BUMEX) 1 MG tablet [id number] Yes Take 1 mg by mouth 2 (two) times daily. Per Dr. Juleen China [provider] Taking Active Self           Med Note [facility], [facility] A   Thu Oct 06, 2018  3:14 PM) Taking 2 mg (2 tablets) each morning and 1 mg (1 tablet) each afternoon (as last prescribed by Cardiology)  calcitRIOL (ROCALTROL) 0.5 MCG capsule [id number] Yes Take 0.5 mcg by mouth daily. [provider] Taking Active   carvedilol (COREG) 25 MG tablet [id number] Yes Take 2 tablets (50 mg total) by mouth 2 (two) times daily with a meal. [name], [name], DO Taking Active   Dulaglutide (TRULICITY) 1.75 ZW/2.5EN SOPN [id number] Yes Inject 0.75 mg into the skin once a week. [provider] Taking Active            Med Note [name], [facility] A   Wed Jul 20, 2018  5:42 PM) Taking on Fridays  esomeprazole (NEXIUM) 40 MG capsule [id number] Yes Take 1 capsule (40 mg total) by mouth daily before breakfast. [name], DO Taking Active   hydrOXYzine (VISTARIL) 25 MG capsule [id number] Yes Take 1 capsule (25 mg total) by mouth every 8 (eight) hours as needed for itching. [name], DO Taking Active   Insulin Glargine [facility]) 100 UNIT/ML SOPN [id number] Yes Inject 10 Units into the skin daily. Submitted to [employer] for  assistance [name], DO Taking Active Self           Med Note [name], Ameyah Bangura A   Mon Jan 15, 2020 10:34 AM)    Insulin Pen Needle (B-D UF III MINI PEN NEEDLES) 31G X 5 MM MISC [id number] Yes 1 each by Does not apply route daily. Use once daily with Victoza. [name], DO Taking Active Self  Lancets [facility] ULTRASOFT) lancets [id number] Yes Check blood glucose up to 3 x daily [name], DO Taking Active   lisinopril (ZESTRIL) 2.5 MG tablet [id number] Yes Take 1 tablet (2.5 mg total) by mouth daily. [name], DO Taking Active   loratadine (CLARITIN) 10 MG tablet [id number] Yes Take 1 tablet (10 mg total) by mouth daily. [name], DO Taking Active   ONE TOUCH ULTRA TEST test strip [id number] Yes Check sugar 3 x daily [name], DO Taking Active   simethicone (MYLICON) 761 MG chewable tablet [id number] Yes Chew 125 mg by mouth every 6 (six) hours as needed for flatulence. [provider] Taking Active   traMADol (ULTRAM) 50 MG tablet [id number] Yes Take 1-2 tablets (50-100 mg total) by mouth every 12 (twelve) hours as needed. [name], DO Taking Active   vitamin C (ASCORBIC ACID) 500 MG tablet [id number] Yes Take 1,000 mg by mouth daily. [provider] Taking Active Self           Med Note [name], [name]   Tue Apr 09, 2015  8:28 AM)            Patient Active Problem List   Diagnosis Date Noted  . Background diabetic retinopathy ([place]) 10/17/2018  . Chronic pain syndrome 01/21/2017  . Chronic restrictive lung disease 02/19/2016  . Primary osteoarthritis involving multiple joints 01/24/2016  . Asthma 09/03/2015  . Elevated transaminase level 06/04/2015  . Excessive daytime sleepiness 05/07/2015  . GERD (gastroesophageal reflux disease) 05/07/2015  . Noncompliance 04/09/2015  . Allergic rhinitis 04/09/2015  . Hyperlipidemia associated with type 2 diabetes mellitus ([place])  02/15/2015  . Vitamin D deficiency 01/17/2015  . Elevated CO2 level 01/17/2015  . Tobacco abuse, episodic 01/17/2015  . Insomnia 01/17/2015  . Current tobacco use 01/11/2015  . Benign hypertension with CKD (chronic kidney disease) stage IV ([place]) 07/10/2014  . Proteinuria 07/10/2014  . Type 2 diabetes mellitus, controlled, with renal complications ([place]) 67/34/1937  . CKD (chronic kidney disease), stage IV ([place]) 07/10/2014  . Secondary renal hyperparathyroidism ([place]) 07/10/2014  . Cardiomyopathy, dilated, nonischemic ([place])   . Morbid obesity with BMI of 45.0-49.9, adult ([place])   . Gout   . Chronic systolic heart failure ([place]) 04/29/2011    Conditions to be addressed/monitored: HTN, HLD and DMII  Care Plan : PharmD - Med Assistance  Updates made by [name], [place] since 03/20/2020 12:00 AM    Problem: Disease Progression     Long-Range Goal: Disease Progression Prevented or Minimized   Start Date: 03/06/2020  Expected End Date: 06/04/2020  Recent Progress:  On track  Priority: High  Note:   Current Barriers:  . Financial Barriers  o Patient APPROVED to receive Trulicity and Engineer, agricultural through [employer] patient assistance program through 01/25/2021 . Lack of blood sugar or blood pressure results for the clinical team . Unable to keep appointments with CCM team  Pharmacist Clinical Goal(s):  [name] Kitchen Over the next 90 days, patient will achieve adherence to monitoring guidelines and medication adherence to achieve therapeutic efficacy through collaboration with PharmD and provider.   Interventions: . 1:1 collaboration with [name], DO regarding development and update of comprehensive plan of care as evidenced by provider attestation and co-signature . Inter-disciplinary care team collaboration (see longitudinal plan of care) . Receive coordination of care call from [place] requesting follow up with patient as he has a question about his bumetanide Rx . Follow  up with patient who reports that he has been taking bumetanide 1 mg - 2 tablets (2 mg) by mouth three times daily as he discussed with Dr. Juleen China at his last visit and reports this new dose is working perfectly for him, but is concerned that he will runout without a new Rx reflecting this dosing o From review of chart, find per Office Visit note from Dr. Juleen China on 2/1, provider ordered prescription for patient to take: "bumetanide (BUMEX) 1 MG tablet - Take 2 tablets (2 mg total) by mouth in the morning and 2 tablets (2 mg total) at noon and 2 tablets (2 mg total) in the evening." o From review of dispensing record in chart, note this Rx filled by patient's pharmacy on 2/1.  [name] Kitchen Patient unable to review current bottle at this time as reports he is not currently home.  . Encourage patient to review latest bottle when he returns home and follow up with pharmacy if has not yet picked up this latest Rx. . Counsel patient to follow up with Nephrologist if needed for further questions/concerns regarding bumetanide prescription.  Medication Assistance . Receive message from [place] letting me know patient approved to receive Trulicity and Engineer, agricultural through [place] patient assistance program through 01/25/2021 . Patient reports he picked up medications received from patient assistance program from PCP office today.  Patient Goals/Self-Care Activities . Over the next 90 days, patient will:  - check glucose, document, and provide at future appointments - collaborate with provider on medication access solutions  Follow Up Plan: Will collaborate with Care Guide for assistance with rescheduling appointment with patient to complete medication review      Follow Up:  Patient agrees to Care Plan and Follow-up.  Plan: Will collaborate with Care Guide for assistance with rescheduling appointment with patient to complete medication review.  [name], PharmD, [place] [phone]

## 2020-03-20 NOTE — Patient Instructions (Signed)
Visit Information  PATIENT GOALS: Goals Addressed            This Visit's Progress   . Pharmacy Goals       Our goal A1c is less than 7%. This corresponds with fasting sugars less than 130 and 2 hour after meal sugars less than 180. Please check your blood sugar and keep a log of the results  Our goal bad cholesterol, or LDL, is less than 70 . This is why it is important to continue taking your atorvastatin.  Please check your home blood pressure, keep a log of the results and bring this with you to your medical appointments.  Feel free to call me with any questions or concerns. I look forward to our next call!  [name], PharmD, [place] [phone]       The patient verbalized understanding of instructions, educational materials, and care plan provided today and declined offer to receive copy of patient instructions, educational materials, and care plan.   Will collaborate with Care Guide for assistance with rescheduling appointment with patient to complete medication review.  [name], PharmD, [place] [phone]

## 2020-03-20 NOTE — Chronic Care Management (AMB) (Unsigned)
  Care Management   Note  03/20/2020 Name: [name] MRN: [id number] DOB: 1965/09/28  [name] is a 55 y.o. year old male who is a primary care patient of [name], DO and is actively engaged with the care management team. I reached out to [name] by phone today to assist with re-scheduling a follow up visit with the Pharmacist  Follow up plan: Unsuccessful telephone outreach attempt made. The care management team will reach out to the patient again over the next 7 days. If patient returns call to provider office, please advise to call [place] Lysle Morales at [facility]

## 2020-03-20 NOTE — Telephone Encounter (Signed)
Copied from [place] [id number]. Topic: General - Other >> Mar 20, 2020  2:50 PM Leward Quan A wrote: Reason for CRM: Patient returned a call to office not sure who called him or why. Please advise Ph# [phone]

## 2020-03-22 NOTE — Chronic Care Management (AMB) (Signed)
°  Care Management   Note  03/22/2020 Name: [name] MRN: [id number] DOB: 1966-01-14  Okey Regal Mowad is a 55 y.o. year old male who is a primary care patient of [name], DO and is actively engaged with the care management team. I reached out to [name] by phone today to assist with re-scheduling a follow up visit with the Pharmacist  Follow up plan: Telephone appointment with care management team member scheduled for: 03/27/2020  [place] [facility]

## 2020-03-25 ENCOUNTER — Other Ambulatory Visit: Payer: Self-pay | Admitting: Family Medicine

## 2020-03-25 DIAGNOSIS — M1A39X0 Chronic gout due to renal impairment, multiple sites, without tophus (tophi): Secondary | ICD-10-CM

## 2020-03-27 ENCOUNTER — Ambulatory Visit (INDEPENDENT_AMBULATORY_CARE_PROVIDER_SITE_OTHER): Payer: PPO | Admitting: Pharmacist

## 2020-03-27 ENCOUNTER — Other Ambulatory Visit: Payer: Self-pay | Admitting: Family Medicine

## 2020-03-27 DIAGNOSIS — J4520 Mild intermittent asthma, uncomplicated: Secondary | ICD-10-CM

## 2020-03-27 DIAGNOSIS — N184 Chronic kidney disease, stage 4 (severe): Secondary | ICD-10-CM

## 2020-03-27 DIAGNOSIS — E785 Hyperlipidemia, unspecified: Secondary | ICD-10-CM

## 2020-03-27 DIAGNOSIS — E1169 Type 2 diabetes mellitus with other specified complication: Secondary | ICD-10-CM

## 2020-03-27 DIAGNOSIS — I129 Hypertensive chronic kidney disease with stage 1 through stage 4 chronic kidney disease, or unspecified chronic kidney disease: Secondary | ICD-10-CM

## 2020-03-27 DIAGNOSIS — E1122 Type 2 diabetes mellitus with diabetic chronic kidney disease: Secondary | ICD-10-CM

## 2020-03-27 MED ORDER — ATORVASTATIN CALCIUM 20 MG PO TABS: 20.0000 mg | ORAL_TABLET | Freq: Every day | ORAL | 1 refills | Status: DC

## 2020-03-27 MED ORDER — ALBUTEROL SULFATE HFA 108 (90 BASE) MCG/ACT IN AERS: 2.0000 | INHALATION_SPRAY | RESPIRATORY_TRACT | 3 refills | Status: DC | PRN

## 2020-03-27 NOTE — Chronic Care Management (AMB) (Signed)
Chronic Care Management Pharmacy Note  03/27/2020 Name:  DAYLIN GRUSZKA MRN:  [id number] DOB:  03-03-1965  Subjective: SCOTT VANDERVEER is an 55 y.o. year old male who is a primary patient of [name], DO.  The CCM team was consulted for assistance with disease management and care coordination needs.    Engaged with patient by telephone for follow up visit in response to provider referral for pharmacy case management and/or care coordination services.   Consent to Services:  The patient was given information about Chronic Care Management services, agreed to services, and gave verbal consent prior to initiation of services.  Please see initial visit note for detailed documentation.   Objective:  Lab Results  Component Value Date   CREATININE 1.83 (H) 10/05/2018   CREATININE 2.21 (H) 03/05/2018   CREATININE 3.07 (H) 03/04/2018    Lab Results  Component Value Date   HGBA1C 6.0 (H) 01/04/2020       Component Value Date/Time   CHOL 121 01/04/2020 0935   CHOL 105 01/17/2015 1120   CHOL 110 08/07/2014 0848   TRIG 187 (H) 01/04/2020 0935   TRIG 159 (H) 08/07/2014 0848   HDL 61 01/04/2020 0935   HDL 48 01/17/2015 1120   CHOLHDL 2.0 01/04/2020 0935   VLDL 11 08/06/2015 0937   VLDL 32 (H) 08/07/2014 0848   LDLCALC 34 01/04/2020 0935    BP Readings from Last 3 Encounters:  01/04/20 (!) 155/97  06/27/19 132/84  03/08/18 107/76    Assessment: Review of patient past medical history, allergies, medications, health status, including review of consultants reports, laboratory and other test data, was performed as part of comprehensive evaluation and provision of chronic care management services.   SDOH:  (Social Determinants of Health) assessments and interventions performed: none   CCM Care Plan  Allergies  Allergen Reactions  . Talc     Medications Reviewed Today    Reviewed by [name], [facility] (Pharmacist) on 03/27/20 at 1446  Med List Status:  <None>  Medication Order Taking? Sig Documenting Provider Last Dose Status Informant  albuterol (PROVENTIL) (2.5 MG/3ML) 0.083% nebulizer solution [id number] Yes Take 3 mLs (2.5 mg total) by nebulization every 6 (six) hours as needed for wheezing or shortness of breath. [name], DO Taking Active   albuterol (VENTOLIN HFA) 108 (90 Base) MCG/ACT inhaler [id number] No Inhale 2 puffs into the lungs every 4 (four) hours as needed for wheezing or shortness of breath.  Patient not taking: Reported on 03/27/2020   [name], DO Not Taking Active   allopurinol (ZYLOPRIM) 100 MG tablet [id number] Yes Take 2 tablets (200 mg total) by mouth daily. [name], DO Taking Active   aspirin 81 MG chewable tablet [id number] Yes Chew 81 mg by mouth daily. [provider] Taking Active Self  atorvastatin (LIPITOR) 20 MG tablet [id number] No Take 1 tablet (20 mg total) by mouth daily.  Patient not taking: Reported on 03/27/2020   [name], DO Not Taking Active   blood glucose meter kit and supplies KIT [id number]  Brand of choice; LON 99 months; check FSBS 3x a day; E11.65; disp one meter with 100 strips + 1 refill of strips [name], [name], MD  Active Self  Blood Glucose Monitoring Suppl (ONE TOUCH ULTRA 2) w/Device KIT [id number]  Use to check blood glucose up to 3 x daily [name], DO  Active   bumetanide (BUMEX) 1 MG tablet [id number] Yes Take  by mouth. Take 2 tablets (2 mg total) by mouth in the morning and 2 tablets (2 mg total) at noon and 2 tablets (2 mg total) in the evening. [provider] Taking Active   calcitRIOL (ROCALTROL) 0.5 MCG capsule [id number] Yes Take by mouth. Take 2 capsules (1 mcg total) by mouth once daily [provider] Taking Active   carvedilol (COREG) 25 MG tablet [id number] Yes Take 2 tablets (50 mg total) by mouth 2 (two) times daily with a meal. [name], [name], DO Taking Active    Dulaglutide (TRULICITY) 5.42 HC/6.2BJ SOPN [id number] Yes Inject 0.75 mg into the skin once a week. [provider] Taking Active            Med Note [name], Verginia Toohey A   Wed Jul 20, 2018  5:42 PM) Taking on Fridays  esomeprazole (NEXIUM) 40 MG capsule [id number] Yes Take 1 capsule (40 mg total) by mouth daily before breakfast. [name], DO Taking Active   hydrOXYzine (VISTARIL) 25 MG capsule [id number] No Take 1 capsule (25 mg total) by mouth every 8 (eight) hours as needed for itching.  Patient not taking: Reported on 03/27/2020   [name], DO Not Taking Active   Insulin Glargine [facility]) 100 UNIT/ML SOPN [id number] Yes Inject 10 Units into the skin daily. Submitted to [employer] for assistance [name], DO Taking Active Self           Med Note [name], Annasophia Crocker A   Mon Jan 15, 2020 10:34 AM)    Insulin Pen Needle (B-D UF III MINI PEN NEEDLES) 31G X 5 MM MISC [id number]  1 each by Does not apply route daily. Use once daily with Victoza. [name], DO  Active Self  Lancets [facility] ULTRASOFT) lancets [id number]  Check blood glucose up to 3 x daily [name], DO  Active   lisinopril (ZESTRIL) 2.5 MG tablet [id number] Yes Take 1 tablet (2.5 mg total) by mouth daily. [name], DO Taking Active   loratadine (CLARITIN) 10 MG tablet [id number] Yes Take 1 tablet (10 mg total) by mouth daily. [name], DO Taking Active   magnesium oxide (MAG-OX) 400 (241.3 Mg) MG tablet [id number] Yes Take 1 tablet by mouth daily. [provider] Taking Active   ONE TOUCH ULTRA TEST test strip [id number]  Check sugar 3 x daily [name], DO  Active   simethicone (MYLICON) 093 MG chewable tablet [id number] No Chew 125 mg by mouth every 6 (six) hours as needed for flatulence.  Patient not taking: Reported on 03/27/2020   [provider] Not Taking Active   traMADol  (ULTRAM) 50 MG tablet [id number] Yes Take 1-2 tablets (50-100 mg total) by mouth every 12 (twelve) hours as needed. [name], DO Taking Active   vitamin C (ASCORBIC ACID) 500 MG tablet [id number] Yes Take 1,000 mg by mouth daily. [provider] Taking Active Self           Med Note [name], [name]   Tue Apr 09, 2015  8:28 AM)            Patient Active Problem List   Diagnosis Date Noted  . Background diabetic retinopathy ([place]) 10/17/2018  . Chronic pain syndrome 01/21/2017  . Chronic restrictive lung disease 02/19/2016  . Primary osteoarthritis involving multiple joints 01/24/2016  . Asthma 09/03/2015  . Elevated transaminase level 06/04/2015  . Excessive daytime sleepiness 05/07/2015  . GERD (gastroesophageal reflux  disease) 05/07/2015  . Noncompliance 04/09/2015  . Allergic rhinitis 04/09/2015  . Hyperlipidemia associated with type 2 diabetes mellitus ([place]) 02/15/2015  . Vitamin D deficiency 01/17/2015  . Elevated CO2 level 01/17/2015  . Tobacco abuse, episodic 01/17/2015  . Insomnia 01/17/2015  . Current tobacco use 01/11/2015  . Benign hypertension with CKD (chronic kidney disease) stage IV ([place]) 07/10/2014  . Proteinuria 07/10/2014  . Type 2 diabetes mellitus, controlled, with renal complications ([place]) 94/85/4627  . CKD (chronic kidney disease), stage IV ([place]) 07/10/2014  . Secondary renal hyperparathyroidism ([place]) 07/10/2014  . Cardiomyopathy, dilated, nonischemic ([place])   . Morbid obesity with BMI of 45.0-49.9, adult ([place])   . Gout   . Chronic systolic heart failure ([place]) 04/29/2011    Conditions to be addressed/monitored: CHF, HTN, HLD, DMII, asthma and CKD  Care Plan : PharmD - Med Assistance  Updates made by [name], RPH since 03/27/2020 12:00 AM    Problem: Disease Progression     Long-Range Goal: Disease Progression Prevented or Minimized Completed 03/27/2020  Start Date: 03/06/2020  Expected End Date: 06/04/2020  Recent  Progress: On track  Priority: High  Note:   Current Barriers:  . Financial Barriers  o Patient APPROVED to receive Trulicity and Engineer, agricultural through [employer] patient assistance program through 01/25/2021 . Lack of blood sugar or blood pressure results for the clinical team . Unable to keep appointments with CCM team  Pharmacist Clinical Goal(s):  [name] Kitchen Over the next 90 days, patient will achieve adherence to monitoring guidelines and medication adherence to achieve therapeutic efficacy through collaboration with PharmD and provider.   Interventions: . 1:1 collaboration with [name], DO regarding development and update of comprehensive plan of care as evidenced by provider attestation and co-signature . Inter-disciplinary care team collaboration (see longitudinal plan of care) . Comprehensive medication review performed; medication list updated in electronic medical record o Identify patient has run out of atorvastatin - Note from review of chart, last Rx for atorvastatin 20 mg sent to pharmacy on 11/4 for 90 day supply, 0 refills - Note patient seen for annual physical exam with PCP on 12/9 - Will collaborate with PCP to request new atorvastatin Rx be sent to pharmacy o Reports uses albuterol nebulizer solution as needed for wheezing/shortness of breath at home, but reports has not refilled albuterol inhaler as recalled copayment to be expensive. o Review formulary for patient's health plan with patient from [employer] website. Note generic albuterol currently covered as tier 2 option o Encourage patient to pick up refill of albuterol inhaler to have to take with him when not home for wheezing or shortness of breath  Type 2 Diabetes . Controlled; taking Trulicity 0.35 mg weekly and Basaglar 10 units daily . Denies s/s low blood sugar . Reports still having trouble with glucometer at home. ? Have discussed importance of having working meter and monitoring home blood  sugar ? Again states he will take current blood sugar meter(s) and supplies into local pharmacy for assistance and follow up with office if in need of additional Rx or support    Hypertension  Reports taking:  Bumetanide 1 mg - 2 tablets (2 mg total) in the morning and 2 tablets (2 mg total) at noon and 2 tablets (2 mg total) in the evening  Carvedilol 25 mg - 2 tablets (50 mg) twice daily  Lisinopril 2.5 mg - 1 tablet daily  Have discussed with patient importance of/rational for monitoring home BP, but patient declines  to self-monitor at home  Note patient has been provided with upper arm BP monitor, but denies monitoring  Patient followed by [facility] Dr Juleen China and [facility]  Patient Goals/Self-Care Activities . Over the next 90 days, patient will:  - check glucose, document, and provide at future appointments - collaborate with provider on medication access solutions  Follow Up Plan:   1) Patient denies further medication questions/concerns at this time 2) Patient confirms having contact information for CCM Pharmacist and states will call as needed for medication questions/concerns 3) CM Pharmacist will reach out to patient again in December 2022 to follow up regarding medication assistance re-enrollment      Follow Up:  Patient agrees to Care Plan and Follow-up.  [name], PharmD, [place] [phone]

## 2020-03-27 NOTE — Patient Instructions (Signed)
Visit Information  PATIENT GOALS: Goals Addressed            This Visit's Progress   . Pharmacy Goals       Our goal A1c is less than 7%. This corresponds with fasting sugars less than 130 and 2 hour after meal sugars less than 180. Please check your blood sugar and keep a log of the results  Our goal bad cholesterol, or LDL, is less than 70 . This is why it is important to continue taking your atorvastatin.  Please check your home blood pressure, keep a log of the results and bring this with you to your medical appointments.  Feel free to call me with any questions or concerns. I look forward to our next call!   [name], PharmD, [place] [phone]       The patient verbalized understanding of instructions, educational materials, and care plan provided today and declined offer to receive copy of patient instructions, educational materials, and care plan.    1) Patient denies further medication questions/concerns at this time 2) Patient confirms having contact information for CCM Pharmacist and states will call as needed for medication questions/concerns 3) CM Pharmacist will reach out to patient again in December 2022 to follow up regarding medication assistance re-enrollment  [name], PharmD, [place] [phone]

## 2020-04-03 ENCOUNTER — Telehealth: Payer: Self-pay

## 2020-04-03 ENCOUNTER — Other Ambulatory Visit: Payer: Self-pay | Admitting: Family Medicine

## 2020-04-03 ENCOUNTER — Ambulatory Visit: Payer: Self-pay | Admitting: Pharmacist

## 2020-04-03 DIAGNOSIS — E1122 Type 2 diabetes mellitus with diabetic chronic kidney disease: Secondary | ICD-10-CM

## 2020-04-03 MED ORDER — ONETOUCH ULTRA 2 W/DEVICE KIT
PACK | 0 refills | Status: DC
Start: 2020-04-03 — End: 2022-03-19

## 2020-04-03 MED ORDER — ONETOUCH ULTRA VI STRP: ORAL_STRIP | 3 refills | Status: DC

## 2020-04-03 MED ORDER — ONETOUCH DELICA LANCETS 33G MISC: 3 refills | Status: DC

## 2020-04-03 NOTE — Chronic Care Management (AMB) (Signed)
Chronic Care Management Pharmacy Note  04/03/2020 Name:  [name] MRN:  [id number] DOB:  1965-06-18  Subjective: [name] is an 55 y.o. year old male who is a primary patient of [name], DO.  The CCM team was consulted for assistance with disease management and care coordination needs.    Receive a voicemail from patient requesting a new glucometer and testing supplies be sent into his local pharmacy, [facility].  Collaborate with PCP  Was unable to reach patient via telephone today and have left HIPAA compliant voicemail asking patient to return my call.   Consent to Services:  The patient was given information about Chronic Care Management services, agreed to services, and gave verbal consent prior to initiation of services.  Please see initial visit note for detailed documentation.   Patient Care Team: [name], DO as PCP - General (Family Medicine) [name], [name], [facility] as Pharmacist [name], LCSW as Social Worker (Licensed Clinical Social Worker) [name], [name], RN as Case Manager (General Practice)  [name]  Allergies  Allergen Reactions  . Talc     Medications     Reviewed by [name], [place] (Pharmacist) on 03/27/20 at 1446  Med List Status: <None>  Medication Order Taking? Sig Documenting Provider Last Dose Status Informant  albuterol (PROVENTIL) (2.5 MG/3ML) 0.083% nebulizer solution [id number] Yes Take 3 mLs (2.5 mg total) by nebulization every 6 (six) hours as needed for wheezing or shortness of breath. [name], DO Taking Active   albuterol (VENTOLIN HFA) 108 (90 Base) MCG/ACT inhaler [id number] No Inhale 2 puffs into the lungs every 4 (four) hours as needed for wheezing or shortness of breath.  Patient not taking: Reported on 03/27/2020   [name], DO Not Taking Active   allopurinol (ZYLOPRIM) 100 MG tablet [id number] Yes Take 2 tablets (200 mg total) by  mouth daily. [name], DO Taking Active   aspirin 81 MG chewable tablet [id number] Yes Chew 81 mg by mouth daily. [provider] Taking Active Self  atorvastatin (LIPITOR) 20 MG tablet [id number] No Take 1 tablet (20 mg total) by mouth daily.  Patient not taking: Reported on 03/27/2020   [name], DO Not Taking Active   blood glucose meter kit and supplies KIT [id number]  Brand of choice; LON 99 months; check FSBS 3x a day; E11.65; disp one meter with 100 strips + 1 refill of strips [name], [name], MD  Active Self  Blood Glucose Monitoring Suppl (ONE TOUCH ULTRA 2) w/Device KIT [id number]  Use to check blood glucose up to 3 x daily [name], DO  Active   bumetanide (BUMEX) 1 MG tablet [id number] Yes Take by mouth. Take 2 tablets (2 mg total) by mouth in the morning and 2 tablets (2 mg total) at noon and 2 tablets (2 mg total) in the evening. [provider] Taking Active   calcitRIOL (ROCALTROL) 0.5 MCG capsule [id number] Yes Take by mouth. Take 2 capsules (1 mcg total) by mouth once daily [provider] Taking Active   carvedilol (COREG) 25 MG tablet [id number] Yes Take 2 tablets (50 mg total) by mouth 2 (two) times daily with a meal. [name], [name], DO Taking Active   Dulaglutide (TRULICITY) 5.99 HF/4.1SE SOPN [id number] Yes Inject 0.75 mg into the skin once a week. [provider] Taking Active            Med  Note [facility], Shacola Schussler A   Wed Jul 20, 2018  5:42 PM) Taking on Fridays  esomeprazole (NEXIUM) 40 MG capsule [id number] Yes Take 1 capsule (40 mg total) by mouth daily before breakfast. [name], DO Taking Active   hydrOXYzine (VISTARIL) 25 MG capsule [id number] No Take 1 capsule (25 mg total) by mouth every 8 (eight) hours as needed for itching.  Patient not taking: Reported on 03/27/2020   [name], DO Not Taking Active   Insulin Glargine [facility]) 100 UNIT/ML SOPN  [id number] Yes Inject 10 Units into the skin daily. Submitted to [employer] for assistance [name], DO Taking Active Self           Med Note [name], [name] A   Mon Jan 15, 2020 10:34 AM)    Insulin Pen Needle (B-D UF III MINI PEN NEEDLES) 31G X 5 MM MISC [id number]  1 each by Does not apply route daily. Use once daily with Victoza. [name], DO  Active Self  Lancets [facility] ULTRASOFT) lancets [id number]  Check blood glucose up to 3 x daily [name], DO  Active   lisinopril (ZESTRIL) 2.5 MG tablet [id number] Yes Take 1 tablet (2.5 mg total) by mouth daily. [name], DO Taking Active   loratadine (CLARITIN) 10 MG tablet [id number] Yes Take 1 tablet (10 mg total) by mouth daily. [name], DO Taking Active   magnesium oxide (MAG-OX) 400 (241.3 Mg) MG tablet [id number] Yes Take 1 tablet by mouth daily. [provider] Taking Active   ONE TOUCH ULTRA TEST test strip [id number]  Check sugar 3 x daily [name], DO  Active   simethicone (MYLICON) 155 MG chewable tablet [id number] No Chew 125 mg by mouth every 6 (six) hours as needed for flatulence.  Patient not taking: Reported on 03/27/2020   [provider] Not Taking Active   traMADol (ULTRAM) 50 MG tablet [id number] Yes Take 1-2 tablets (50-100 mg total) by mouth every 12 (twelve) hours as needed. [name], DO Taking Active   vitamin C (ASCORBIC ACID) 500 MG tablet [id number] Yes Take 1,000 mg by mouth daily. [provider] Taking Active Self           Med Note [name], [name]   Tue Apr 09, 2015  8:28 AM)            Patient Active Problem List   Diagnosis Date Noted  . Background diabetic retinopathy ([place]) 10/17/2018  . Chronic pain syndrome 01/21/2017  . Chronic restrictive lung disease 02/19/2016  . Primary osteoarthritis involving multiple joints 01/24/2016  . Asthma 09/03/2015  . Elevated transaminase  level 06/04/2015  . Excessive daytime sleepiness 05/07/2015  . GERD (gastroesophageal reflux disease) 05/07/2015  . Noncompliance 04/09/2015  . Allergic rhinitis 04/09/2015  . Hyperlipidemia associated with type 2 diabetes mellitus ([place]) 02/15/2015  . Vitamin D deficiency 01/17/2015  . Elevated CO2 level 01/17/2015  . Tobacco abuse, episodic 01/17/2015  . Insomnia 01/17/2015  . Current tobacco use 01/11/2015  . Benign hypertension with CKD (chronic kidney disease) stage IV ([place]) 07/10/2014  . Proteinuria 07/10/2014  . Type 2 diabetes mellitus, controlled, with renal complications ([place]) 17/35/6701  . CKD (chronic kidney disease), stage IV ([place]) 07/10/2014  . Secondary renal hyperparathyroidism ([place]) 07/10/2014  . Cardiomyopathy, dilated, nonischemic ([place])   . Morbid obesity with BMI of 45.0-49.9, adult ([place])   . Gout   . Chronic systolic heart failure ([place]) 04/29/2011  Immunization History  Administered Date(s) Administered  . Influenza, Seasonal, Injecte, Preservative Fre 11/30/2007  . Influenza,inj,Quad PF,6+ Mos 01/17/2015, 10/15/2016  . Influenza,inj,quad, With Preservative 11/11/2017  . Influenza-Unspecified 01/03/2014, 10/22/2015, 11/09/2017  . PPD Test 07/02/2015  . Pneumococcal Polysaccharide-23 01/17/2015  . Td 02/14/2003    Conditions to be addressed/monitored: DMII  Care Plan : PharmD - Med Assistance  Updates made by [name], RPH since 04/03/2020 12:00 AM    Problem: Disease Progression     Long-Range Goal: Disease Progression Prevented or Minimized   Start Date: 04/03/2020  Expected End Date: 07/02/2020  This Visit's Progress: On track  Priority: High  Note:   Current Barriers:  . Financial Barriers  ? Patient APPROVED to receive Trulicity and Engineer, agricultural through [employer] patient assistance program through 01/25/2021 . Lack of blood sugar or blood pressure results for the clinical team . Unable to keep appointments with CCM team   Pharmacist Clinical  Goal(s):   Over the next 90 days, patient will achieve adherence to monitoring guidelines and medication adherence to achieve therapeutic efficacy through collaboration with PharmD and provider.    Interventions: . 1:1 collaboration with [name], DO regarding development and update of comprehensive plan of care as evidenced by provider attestation and co-signature . Inter-disciplinary care team collaboration (see longitudinal plan of care) . Receive a voicemail from patient requesting a new glucometer and testing supplies be sent into his local pharmacy, [facility]  o Note patient previously prescribed One Touch Ultra 2 meter and per formulary from [employer], One Touch products are a preferred option under patient's plan. [name] with PCP to request provider sent new Rx for One Touch Ultra 2 kit and testing supplies into pharmacy for patient. . Unable to reach patient to follow up. Leave patient voicemail message providing call back information for CM Pharmacist and clinic.  Patient Goals/Self-Care Activities  Over the next 90 days, patient will:  - check glucose, document, and provide at future appointments - collaborate with provider on medication access solutions   Follow Up Plan:    1) CM Pharmacist will reach out to patient again in December 2022 to follow up regarding medication assistance re-enrollment as previously planned    Patient's preferred pharmacy is:  [facility], [place] [place] [address] Phone: [phone] Fax: [phone]  [name], PharmD, [facility] [phone]

## 2020-04-10 ENCOUNTER — Ambulatory Visit: Payer: Self-pay | Admitting: Pharmacist

## 2020-04-10 DIAGNOSIS — Z794 Long term (current) use of insulin: Secondary | ICD-10-CM

## 2020-04-10 DIAGNOSIS — N184 Chronic kidney disease, stage 4 (severe): Secondary | ICD-10-CM

## 2020-04-10 DIAGNOSIS — E1122 Type 2 diabetes mellitus with diabetic chronic kidney disease: Secondary | ICD-10-CM

## 2020-04-10 DIAGNOSIS — I129 Hypertensive chronic kidney disease with stage 1 through stage 4 chronic kidney disease, or unspecified chronic kidney disease: Secondary | ICD-10-CM | POA: Diagnosis not present

## 2020-04-10 NOTE — Chronic Care Management (AMB) (Signed)
Chronic Care Management Pharmacy Note  04/10/2020 Name:  [name] MRN:  [id number] DOB:  April 26, 1965  Subjective: [name] is an 55 y.o. year old male who is a primary patient of [name], DO.  The CCM team was consulted for assistance with disease management and care coordination needs.    Receive call from [facility] CPhT [name] today requesting CM Pharmacist outreach to patient.  Engaged with patient by telephone for follow up visit in response to provider referral for pharmacy case management and/or care coordination services.   Consent to Services:  The patient was given information about Chronic Care Management services, agreed to services, and gave verbal consent prior to initiation of services.  Please see initial visit note for detailed documentation.   Patient Care Team: [name], DO as PCP - General (Family Medicine) [place], [name], [facility] as Pharmacist [name], LCSW as Social Worker (Licensed Clinical Social Worker) Vanita Ingles, RN as Case Manager [facility])  Hospital visits: None in previous 6 months  Objective:  Lab Results  Component Value Date   CREATININE 1.83 (H) 10/05/2018   CREATININE 2.21 (H) 03/05/2018   CREATININE 3.07 (H) 03/04/2018    Lab Results  Component Value Date   HGBA1C 6.0 (H) 01/04/2020   Last diabetic Eye exam:  Lab Results  Component Value Date/Time   HMDIABEYEEXA Retinopathy (A) 10/04/2018 12:00 AM     Social History   Tobacco Use  Smoking Status Never Smoker  Smokeless Tobacco Never Used   BP Readings from Last 3 Encounters:  01/04/20 (!) 155/97  06/27/19 132/84  03/08/18 107/76   Pulse Readings from Last 3 Encounters:  01/04/20 88  06/27/19 83  03/08/18 84   Wt Readings from Last 3 Encounters:  01/04/20 (!) 351 lb (159.2 kg)  06/27/19 (!) 369 lb 9.6 oz (167.6 kg)  01/24/19 (!) 335 lb (152 kg)    Assessment: Review of patient past medical  history, allergies, medications, health status, including review of consultants reports, laboratory and other test data, was performed as part of comprehensive evaluation and provision of chronic care management services.   SDOH:  (Social Determinants of Health) assessments and interventions performed: none   CCM Care Plan  Allergies  Allergen Reactions  . [place]     Medications Reviewed Today    Reviewed by [name], [facility] (Pharmacist) on 03/27/20 at 1446  Med List Status: <None>  Medication Order Taking? Sig Documenting Provider Last Dose Status Informant  albuterol (PROVENTIL) (2.5 MG/3ML) 0.083% nebulizer solution [id number] Yes Take 3 mLs (2.5 mg total) by nebulization every 6 (six) hours as needed for wheezing or shortness of breath. [name], DO Taking Active   albuterol (VENTOLIN HFA) 108 (90 Base) MCG/ACT inhaler [id number] No Inhale 2 puffs into the lungs every 4 (four) hours as needed for wheezing or shortness of breath.  Patient not taking: Reported on 03/27/2020   [name], DO Not Taking Active   allopurinol (ZYLOPRIM) 100 MG tablet [id number] Yes Take 2 tablets (200 mg total) by mouth daily. [name], DO Taking Active   aspirin 81 MG chewable tablet [id number] Yes Chew 81 mg by mouth daily. [provider] Taking Active Self  atorvastatin (LIPITOR) 20 MG tablet [id number] No Take 1 tablet (20 mg total) by mouth daily.  Patient not taking: Reported on 03/27/2020   [name], DO Not Taking Active   blood glucose meter kit  and supplies KIT [id number]  Brand of choice; LON 99 months; check FSBS 3x a day; E11.65; disp one meter with 100 strips + 1 refill of strips [name], [name], MD  Active Self  Blood Glucose Monitoring Suppl (ONE TOUCH ULTRA 2) w/Device KIT [id number]  Use to check blood glucose up to 3 x daily [name], [name], DO  Active   bumetanide (BUMEX) 1 MG tablet [id number] Yes Take by mouth.  Take 2 tablets (2 mg total) by mouth in the morning and 2 tablets (2 mg total) at noon and 2 tablets (2 mg total) in the evening. [provider] Taking Active   calcitRIOL (ROCALTROL) 0.5 MCG capsule [id number] Yes Take by mouth. Take 2 capsules (1 mcg total) by mouth once daily [provider] Taking Active   carvedilol (COREG) 25 MG tablet [id number] Yes Take 2 tablets (50 mg total) by mouth 2 (two) times daily with a meal. [name], [name], DO Taking Active   Dulaglutide (TRULICITY) 2.44 WN/0.2VO SOPN [id number] Yes Inject 0.75 mg into the skin once a week. [provider] Taking Active            Med Note [name], ELISABETH A   Wed Jul 20, 2018  5:42 PM) Taking on Fridays  esomeprazole (NEXIUM) 40 MG capsule [id number] Yes Take 1 capsule (40 mg total) by mouth daily before breakfast. [name], DO Taking Active   hydrOXYzine (VISTARIL) 25 MG capsule [id number] No Take 1 capsule (25 mg total) by mouth every 8 (eight) hours as needed for itching.  Patient not taking: Reported on 03/27/2020   [name], DO Not Taking Active   Insulin Glargine [facility]) 100 UNIT/ML SOPN [id number] Yes Inject 10 Units into the skin daily. Submitted to [employer] for assistance [name], DO Taking Active Self           Med Note [name], ELISABETH A   Mon Jan 15, 2020 10:34 AM)    Insulin Pen Needle (B-D UF III MINI PEN NEEDLES) 31G X 5 MM MISC [id number]  1 each by Does not apply route daily. Use once daily with Victoza. [name], DO  Active Self  Lancets [facility] ULTRASOFT) lancets [id number]  Check blood glucose up to 3 x daily [name], DO  Active   lisinopril (ZESTRIL) 2.5 MG tablet [id number] Yes Take 1 tablet (2.5 mg total) by mouth daily. [name], DO Taking Active   loratadine (CLARITIN) 10 MG tablet [id number] Yes Take 1 tablet (10 mg total) by mouth daily. [name],  DO Taking Active   magnesium oxide (MAG-OX) 400 (241.3 Mg) MG tablet [id number] Yes Take 1 tablet by mouth daily. [provider] Taking Active   ONE TOUCH ULTRA TEST test strip [id number]  Check sugar 3 x daily [name], DO  Active   simethicone (MYLICON) 270 MG chewable tablet [id number] No Chew 125 mg by mouth every 6 (six) hours as needed for flatulence.  Patient not taking: Reported on 03/27/2020   [provider] Not Taking Active   traMADol (ULTRAM) 50 MG tablet [id number] Yes Take 1-2 tablets (50-100 mg total) by mouth every 12 (twelve) hours as needed. [name], DO Taking Active   vitamin C (ASCORBIC ACID) 500 MG tablet [id number] Yes Take 1,000 mg by mouth daily. [provider] Taking Active Self           Med Note ([name], [place] Apr 09, 2015  8:28 AM)            Patient Active Problem List   Diagnosis Date Noted  . Background diabetic retinopathy ([place]) 10/17/2018  . Chronic pain syndrome 01/21/2017  . Chronic restrictive lung disease 02/19/2016  . Primary osteoarthritis involving multiple joints 01/24/2016  . Asthma 09/03/2015  . Elevated transaminase level 06/04/2015  . Excessive daytime sleepiness 05/07/2015  . GERD (gastroesophageal reflux disease) 05/07/2015  . Noncompliance 04/09/2015  . Allergic rhinitis 04/09/2015  . Hyperlipidemia associated with type 2 diabetes mellitus ([place]) 02/15/2015  . Vitamin D deficiency 01/17/2015  . Elevated CO2 level 01/17/2015  . Tobacco abuse, episodic 01/17/2015  . Insomnia 01/17/2015  . Current tobacco use 01/11/2015  . Benign hypertension with CKD (chronic kidney disease) stage IV ([place]) 07/10/2014  . Proteinuria 07/10/2014  . Type 2 diabetes mellitus, controlled, with renal complications ([place]) 42/59/5638  . CKD (chronic kidney disease), stage IV ([place]) 07/10/2014  . Secondary renal hyperparathyroidism ([place]) 07/10/2014  . Cardiomyopathy, dilated, nonischemic ([place])   .  Morbid obesity with BMI of 45.0-49.9, adult ([place])   . Gout   . Chronic systolic heart failure ([place]) 04/29/2011    Immunization History  Administered Date(s) Administered  . Influenza, Seasonal, Injecte, Preservative Fre 11/30/2007  . Influenza,inj,Quad PF,6+ Mos 01/17/2015, 10/15/2016  . Influenza,inj,quad, With Preservative 11/11/2017  . Influenza-Unspecified 01/03/2014, 10/22/2015, 11/09/2017  . PPD Test 07/02/2015  . Pneumococcal Polysaccharide-23 01/17/2015  . Td 02/14/2003    Conditions to be addressed/monitored: HTN, HLD and DMII  Care Plan : PharmD - Med Assistance  Updates made by [name], [place] since 04/10/2020 12:00 AM    Problem: Disease Progression     Long-Range Goal: Disease Progression Prevented or Minimized   Start Date: 04/03/2020  Expected End Date: 07/02/2020  Recent Progress: On track  Priority: High  Note:   Current Barriers:  . Financial Barriers  ? Patient APPROVED to receive Trulicity and Engineer, agricultural through [employer] patient assistance program through 01/25/2021 . Lack of blood sugar or blood pressure results for the clinical team . Unable to keep appointments with CCM team   Pharmacist Clinical Goal(s):   Over the next 90 days, patient will achieve adherence to monitoring guidelines and medication adherence to achieve therapeutic efficacy through collaboration with PharmD and provider.    Interventions: . 1:1 collaboration with [name], DO regarding development and update of comprehensive plan of care as evidenced by provider attestation and co-signature . Inter-disciplinary care team collaboration (see longitudinal plan of care) . Receive call from [facility] CPhT [name] today requesting CM Pharmacist outreach to patient regarding questions about recent episode of shaking/sweating . Follow up with patient today who reports he recently had an episode of shaking/sweating late one morning after not having eaten breakfast that morning or  supper the night prior. o Patient denies having checked his blood sugar with the episode or since  Patient reporting still has not picked up new blood sugar monitor. CM Pharmacist has repeatedly advised patient on importance of having working meter and monitoring home blood sugar and collaborated with PCP on 3/9, who then sent in Rx for new monitor and supplies to pharmacy   Patient states that he will pick up new meter and supplies and resume monitoring of blood sugar  Again counsel patient on s/s of low blood sugars, how to manage lows and to contact office if is having low readings  Disucss importance of eating regular well-balanced meals throughout the day,  avoiding skipping meals  Patient reports will also pick up a new supply of glucose tablets from pharmacy o Patient denies having checked his blood pressure with the episode or since  Note CM Pharmacist and RN have repeatedly encouraged patient to check BP as recommended by providers and upper arm BP monitor provided. However, patient has declined to monitor BP at home.  Again encourage/discuss rational for patient monitoring home BP/HR and keep log of results  Again advise patient to follow directions from Nephrologist and Cardiologist for monitoring of daily weights and fluid restriction and to follow up with Nephrologist and Cardiologist for values outside of established parameters o Advise patient to call his Cardiologist for episodes of dizziness o Patient recalls also needing to follow guidance from Cardiologist regarding avoiding getting overheated . Will collaborate with PCP  Patient Goals/Self-Care Activities  Over the next 90 days, patient will:  - check glucose, document, and provide at future appointments - collaborate with provider on medication access solutions   Follow Up Plan:    1) CM Pharmacist will outreach to patient again by telephone within the next 14 days.     Medication Assistance: Engineer, agricultural and  Trulicityobtained through [employer] medication assistance program.  Enrollment ends 01/25/2021  Patient's preferred pharmacy is:  [facility] [place], [place] [address] [place] [address] Phone: [phone] Fax: [phone]   Follow Up:  Patient agrees to Care Plan and Follow-up.  Plan: The care management team will reach out to the patient again over the next 14 days.  [name], PharmD, [place] [phone]

## 2020-04-10 NOTE — Patient Instructions (Signed)
Visit Information  PATIENT GOALS: Goals Addressed            This Visit's Progress   . Pharmacy Goals       Our goal A1c is less than 7%. This corresponds with fasting sugars less than 130 and 2 hour after meal sugars less than 180. Please check your blood sugar and keep a log of the results  Our goal bad cholesterol, or LDL, is less than 70 . This is why it is important to continue taking your atorvastatin.  Please check your home blood pressure, keep a log of the results and bring this with you to your medical appointments.  Feel free to call me with any questions or concerns. I look forward to our next call!  [name], PharmD, [place] [phone]       The patient verbalized understanding of instructions, educational materials, and care plan provided today and declined offer to receive copy of patient instructions, educational materials, and care plan.   The care management team will reach out to the patient again over the next 14 days.   [name], PharmD, [place] [phone]

## 2020-04-15 ENCOUNTER — Other Ambulatory Visit: Payer: Self-pay | Admitting: Family Medicine

## 2020-04-15 DIAGNOSIS — I10 Essential (primary) hypertension: Secondary | ICD-10-CM

## 2020-04-15 NOTE — Telephone Encounter (Signed)
   Notes to clinic:  review for refill Script has expired    Requested Prescriptions  Pending Prescriptions Disp Refills   carvedilol (COREG) 25 MG tablet [Pharmacy Med Name: CARVEDILOL 25 MG TABLET] 360 tablet 0    Sig: Take 2 tablets (50 mg total) by mouth 2 (two) times daily with a meal.      Cardiovascular:  Beta Blockers Failed - 04/15/2020  9:22 AM      Failed - Last BP in normal range    BP Readings from Last 1 Encounters:  01/04/20 (!) 155/97          Passed - Last Heart Rate in normal range    Pulse Readings from Last 1 Encounters:  01/04/20 88          Passed - Valid encounter within last 6 months    Recent Outpatient Visits           2 months ago Acute non-recurrent maxillary sinusitis   [place], DO   3 months ago Annual physical exam   [facility] [name], DO   9 months ago Controlled type 2 diabetes mellitus with stage 4 chronic kidney disease, with long-term current use of insulin [facility])   [facility] [place], [name], DO   10 months ago Acute non-recurrent frontal sinusitis   [place], DO   1 year ago Controlled type 2 diabetes mellitus with stage 4 chronic kidney disease, with long-term current use of insulin ([place])   [facility] [place], [name], DO       Future Appointments             In 2 months [place], [name], DO [facility] [place]   In 10 months  [facility], [facility]

## 2020-04-16 NOTE — Telephone Encounter (Signed)
Pt called about refill status for carvedilol (COREG) 25 MG tablet / Pt stated pharmacy gave him enough to last him until today and he will be out again tomorrow / please advise

## 2020-04-16 NOTE — Telephone Encounter (Signed)
Please notify patient that rx refill for Carvedilol was sent to [facility] now.  [name], [facility] 04/16/2020, 11:47 AM

## 2020-04-19 ENCOUNTER — Ambulatory Visit: Payer: Self-pay | Admitting: Pharmacist

## 2020-04-19 DIAGNOSIS — N184 Chronic kidney disease, stage 4 (severe): Secondary | ICD-10-CM

## 2020-04-19 DIAGNOSIS — E1122 Type 2 diabetes mellitus with diabetic chronic kidney disease: Secondary | ICD-10-CM

## 2020-04-19 NOTE — Patient Instructions (Signed)
Visit Information  PATIENT GOALS: Goals Addressed            This Visit's Progress   . Pharmacy Goals       Our goal A1c is less than 7%. This corresponds with fasting sugars less than 130 and 2 hour after meal sugars less than 180. Please check your blood sugar and keep a log of the results  Our goal bad cholesterol, or LDL, is less than 70 . This is why it is important to continue taking your atorvastatin.  Please check your home blood pressure, keep a log of the results and bring this with you to your medical appointments.  Feel free to call me with any questions or concerns. I look forward to our next call!   [name], PharmD, [place] [phone]       The patient verbalized understanding of instructions, educational materials, and care plan provided today and declined offer to receive copy of patient instructions, educational materials, and care plan.   Telephone follow up appointment with care management team member scheduled for: 12/30/2020 at 9:30 AM  [name], PharmD, [place], [place] [phone]

## 2020-04-19 NOTE — Chronic Care Management (AMB) (Signed)
Chronic Care Management Pharmacy Note  04/19/2020 Name:  [name] MRN:  [id number] DOB:  [birth year]  Subjective: [name] is an 55 y.o. year old male who is a primary patient of [name], DO.  The CCM team was consulted for assistance with disease management and care coordination needs.    Engaged with patient by telephone for follow up visit in response to provider referral for pharmacy case management and/or care coordination services.   Consent to Services:  The patient was given information about Chronic Care Management services, agreed to services, and gave verbal consent prior to initiation of services.  Please see initial visit note for detailed documentation.   Patient Care Team: [name], DO as PCP - General (Family Medicine) [place], [name], [place] as Pharmacist [name], LCSW as Social Worker (Licensed Clinical Social Worker) [name], [name], RN as Case Manager (General Practice)  Recent office visits: None  Recent consult visits: None  Hospital visits: None in previous 6 months  Objective:  Lab Results  Component Value Date   CREATININE 1.83 (H) 10/05/2018   CREATININE 2.21 (H) 03/05/2018   CREATININE 3.07 (H) 03/04/2018    Lab Results  Component Value Date   HGBA1C 6.0 (H) 01/04/2020   Last diabetic Eye exam:  Lab Results  Component Value Date/Time   HMDIABEYEEXA Retinopathy (A) 10/04/2018 12:00 AM        Component Value Date/Time   CHOL 121 01/04/2020 0935   CHOL 105 01/17/2015 1120   CHOL 110 08/07/2014 0848   TRIG 187 (H) 01/04/2020 0935   TRIG 159 (H) 08/07/2014 0848   HDL 61 01/04/2020 0935   HDL 48 01/17/2015 1120   CHOLHDL 2.0 01/04/2020 0935   VLDL 11 08/06/2015 0937   VLDL 32 (H) 08/07/2014 0848   LDLCALC 34 01/04/2020 0935    Hepatic Function Latest Ref Rng & Units 10/05/2018 02/28/2018 10/13/2017  Total Protein 6.1 - 8.1 g/dL 6.3 7.1 6.9  Albumin 3.5 - 5.0 g/dL - 2.5(L) -   AST 10 - 35 U/L _0 ALT 9 - 46 U/L _1 Alk Phosphatase 38 - 126 U/L - 92 -  Total Bilirubin 0.2 - 1.2 mg/dL 0.6 1.3(H) 0.7  Bilirubin, Direct <=0.2 mg/dL - - -    Social History   Tobacco Use  Smoking Status Never Smoker  Smokeless Tobacco Never Used   BP Readings from Last 3 Encounters:  01/04/20 (!) 155/97  06/27/19 132/84  03/08/18 107/76   Pulse Readings from Last 3 Encounters:  01/04/20 88  06/27/19 83  03/08/18 84   Wt Readings from Last 3 Encounters:  01/04/20 (!) 351 lb (159.2 kg)  06/27/19 (!) 369 lb 9.6 oz (167.6 kg)  01/24/19 (!) 335 lb (152 kg)    Assessment: Review of patient past medical history, allergies, medications, health status, including review of consultants reports, laboratory and other test data, was performed as part of comprehensive evaluation and provision of chronic care management services.   SDOH:  (Social Determinants of Health) assessments and interventions performed: none   CCM Care Plan  Allergies  Allergen Reactions  . Talc     Medications Reviewed Today    Reviewed by [name], [facility] (Pharmacist) on 03/27/20 at 1446  Med List Status: <None>  Medication Order Taking? Sig Documenting Provider Last Dose Status Informant  albuterol (PROVENTIL) (2.5 MG/3ML) 0.083% nebulizer solution [id number] Yes Take 3 mLs (2.5 mg  total) by nebulization every 6 (six) hours as needed for wheezing or shortness of breath. [name], DO Taking Active   albuterol (VENTOLIN HFA) 108 (90 Base) MCG/ACT inhaler [id number] No Inhale 2 puffs into the lungs every 4 (four) hours as needed for wheezing or shortness of breath.  Patient not taking: Reported on 03/27/2020   [name], DO Not Taking Active   allopurinol (ZYLOPRIM) 100 MG tablet [id number] Yes Take 2 tablets (200 mg total) by mouth daily. [name], DO Taking Active   aspirin 81 MG chewable tablet [id number] Yes Chew 81 mg by mouth daily.  [provider] Taking Active Self  atorvastatin (LIPITOR) 20 MG tablet [id number] No Take 1 tablet (20 mg total) by mouth daily.  Patient not taking: Reported on 03/27/2020   [name], DO Not Taking Active   blood glucose meter kit and supplies KIT [id number]  Brand of choice; LON 99 months; check FSBS 3x a day; E11.65; disp one meter with 100 strips + 1 refill of strips [name], [name], MD  Active Self  Blood Glucose Monitoring Suppl (ONE TOUCH ULTRA 2) w/Device KIT [id number]  Use to check blood glucose up to 3 x daily [name], DO  Active   bumetanide (BUMEX) 1 MG tablet [id number] Yes Take by mouth. Take 2 tablets (2 mg total) by mouth in the morning and 2 tablets (2 mg total) at noon and 2 tablets (2 mg total) in the evening. [provider] Taking Active   calcitRIOL (ROCALTROL) 0.5 MCG capsule [id number] Yes Take by mouth. Take 2 capsules (1 mcg total) by mouth once daily [provider] Taking Active   carvedilol (COREG) 25 MG tablet [id number] Yes Take 2 tablets (50 mg total) by mouth 2 (two) times daily with a meal. [name], [name], DO Taking Active   Dulaglutide (TRULICITY) 8.41 YS/0.6TK SOPN [id number] Yes Inject 0.75 mg into the skin once a week. [provider] Taking Active            Med Note [name], ELISABETH A   Wed Jul 20, 2018  5:42 PM) Taking on Fridays  esomeprazole (NEXIUM) 40 MG capsule [id number] Yes Take 1 capsule (40 mg total) by mouth daily before breakfast. [name], DO Taking Active   hydrOXYzine (VISTARIL) 25 MG capsule [id number] No Take 1 capsule (25 mg total) by mouth every 8 (eight) hours as needed for itching.  Patient not taking: Reported on 03/27/2020   [name], DO Not Taking Active   Insulin Glargine [facility]) 100 UNIT/ML SOPN [id number] Yes Inject 10 Units into the skin daily. Submitted to [employer] for assistance [name], DO Taking  Active Self           Med Note [name], ELISABETH A   Mon Jan 15, 2020 10:34 AM)    Insulin Pen Needle (B-D UF III MINI PEN NEEDLES) 31G X 5 MM MISC [id number]  1 each by Does not apply route daily. Use once daily with Victoza. [name], DO  Active Self  Lancets [facility] ULTRASOFT) lancets [id number]  Check blood glucose up to 3 x daily [name], DO  Active   lisinopril (ZESTRIL) 2.5 MG tablet [id number] Yes Take 1 tablet (2.5 mg total) by mouth daily. [name], DO Taking Active   loratadine (CLARITIN) 10 MG tablet [id number] Yes Take 1 tablet (10 mg total) by mouth daily. [name], DO Taking Active  magnesium oxide (MAG-OX) 400 (241.3 Mg) MG tablet [id number] Yes Take 1 tablet by mouth daily. [provider] Taking Active   ONE TOUCH ULTRA TEST test strip [id number]  Check sugar 3 x daily [name], DO  Active   simethicone (MYLICON) 563 MG chewable tablet [id number] No Chew 125 mg by mouth every 6 (six) hours as needed for flatulence.  Patient not taking: Reported on 03/27/2020   [provider] Not Taking Active   traMADol (ULTRAM) 50 MG tablet [id number] Yes Take 1-2 tablets (50-100 mg total) by mouth every 12 (twelve) hours as needed. [name], DO Taking Active   vitamin C (ASCORBIC ACID) 500 MG tablet [id number] Yes Take 1,000 mg by mouth daily. [provider] Taking Active Self           Med Note [name], [name]   Tue Apr 09, 2015  8:28 AM)            Patient Active Problem List   Diagnosis Date Noted  . Background diabetic retinopathy ([place]) 10/17/2018  . Chronic pain syndrome 01/21/2017  . Chronic restrictive lung disease 02/19/2016  . Primary osteoarthritis involving multiple joints 01/24/2016  . Asthma 09/03/2015  . Elevated transaminase level 06/04/2015  . Excessive daytime sleepiness 05/07/2015  . GERD (gastroesophageal reflux disease) 05/07/2015  .  Noncompliance 04/09/2015  . Allergic rhinitis 04/09/2015  . Hyperlipidemia associated with type 2 diabetes mellitus ([place]) 02/15/2015  . Vitamin D deficiency 01/17/2015  . Elevated CO2 level 01/17/2015  . Tobacco abuse, episodic 01/17/2015  . Insomnia 01/17/2015  . Current tobacco use 01/11/2015  . Benign hypertension with CKD (chronic kidney disease) stage IV ([place]) 07/10/2014  . Proteinuria 07/10/2014  . Type 2 diabetes mellitus, controlled, with renal complications ([place]) 32/35/5732  . CKD (chronic kidney disease), stage IV ([place]) 07/10/2014  . Secondary renal hyperparathyroidism ([place]) 07/10/2014  . Cardiomyopathy, dilated, nonischemic ([place])   . Morbid obesity with BMI of 45.0-49.9, adult ([place])   . Gout   . Chronic systolic heart failure ([place]) 04/29/2011    Immunization History  Administered Date(s) Administered  . Influenza, Seasonal, Injecte, Preservative Fre 11/30/2007  . Influenza,inj,Quad PF,6+ Mos 01/17/2015, 10/15/2016  . Influenza,inj,quad, With Preservative 11/11/2017  . Influenza-Unspecified 01/03/2014, 10/22/2015, 11/09/2017  . PPD Test 07/02/2015  . Pneumococcal Polysaccharide-23 01/17/2015  . Td 02/14/2003    Conditions to be addressed/monitored: HTN, HLD and DMII  Care Plan : PharmD - Med Assistance  Updates made by [name], RPH since 04/19/2020 12:00 AM    Problem: Disease Progression     Long-Range Goal: Disease Progression Prevented or Minimized   Start Date: 04/03/2020  Expected End Date: 07/02/2020  Recent Progress: On track  Priority: High  Note:   Current Barriers:  . Financial Barriers  ? Patient APPROVED to receive Trulicity and Engineer, agricultural through [employer] patient assistance program through 01/25/2021 . Lack of blood sugar or blood pressure results for the clinical team   Pharmacist Clinical Goal(s):   Over the next 90 days, patient will achieve adherence to monitoring guidelines and medication adherence to achieve therapeutic efficacy  through collaboration with PharmD and provider.    Interventions: . 1:1 collaboration with [name], DO regarding development and update of comprehensive plan of care as evidenced by provider attestation and co-signature . Inter-disciplinary care team collaboration (see longitudinal plan of care) . Follow up with patient today.  o Patient denies having had any further episodes of shaking/sweating.  o Note on 3/16,  patient reported an episode of shaking/sweating late one morning after not having eaten breakfast that morning or supper the night prior (see note in chart for further details) . Again advise patient to follow directions from Nephrologist and Cardiologist for monitoring of daily weights and fluids and to follow up with Nephrologist and Cardiologist for values outside of established parameters . Again advise patient to contact PCP and/or Cardiologist/Nephrologist for any new or worsening symptoms  Type 2 DM: . Controlled; current treatment: o Trulicity 6.58 mg weekly o Basaglar 10 units daily . Denies monitoring home blood sugar recently o Patient confirms has now picked up new glucometer from pharmacy, but denies having setup meter o Denies needing assistance with setup o Again counsel on importance of having working meter and monitoring home blood sugar o Patient states will setup meter and resume checking home blood sugar again today . Have counseled on s/s of low blood sugars, how to manage lows and to contact office if is having low readings . Have discussed importance of eating regular well-balanced meals throughout the day, avoiding skipping meals   Patient Goals/Self-Care Activities  Over the next 90 days, patient will:  - check glucose, document, and provide at future appointments - collaborate with provider on medication access solutions   Follow Up Plan:    1) CM Pharmacist will outreach to patient by telephone in December as scheduled      Medication Assistance: Trulicity and Engineer, agricultural obtained through [employer] medication assistance program.  Enrollment ends 01/25/2021  Patient's preferred pharmacy is:  [place], [place] - [address] [place] [address] Phone: [phone] Fax: [phone]   Follow Up:  Patient agrees to Care Plan and Follow-up.  Plan:  1)Telephone follow up appointment with care management team member scheduled for: 12/30/2020 at 9:30 AM 2) The patient has been provided with contact information for the care management team and has been advised to call with any health related questions or concerns.   [name], PharmD, [place], CPP Clinical Pharmacist [facility] [phone]

## 2020-04-30 ENCOUNTER — Telehealth: Payer: Self-pay

## 2020-04-30 DIAGNOSIS — J3089 Other allergic rhinitis: Secondary | ICD-10-CM

## 2020-04-30 MED ORDER — MONTELUKAST SODIUM 10 MG PO TABS: 10.0000 mg | ORAL_TABLET | Freq: Every day | ORAL | 1 refills | Status: DC

## 2020-04-30 NOTE — Telephone Encounter (Signed)
Copied from [place] [phone]. Topic: General - Inquiry >> Apr 29, 2020  4:19 PM Greggory Keen D wrote: Reason for CRM: Pt called saying he is having seasonal allergies and wants to know if Dr. Raliegh Ip can send him something to the pharmacy.  He ask a nurse to call him back.  CB#  [phone]

## 2020-04-30 NOTE — Telephone Encounter (Signed)
Sent rx Montelukast (generic of Singulair) '10mg'$  nightly to [place] 04/30/2020, 12:30 PM

## 2020-05-29 ENCOUNTER — Telehealth: Payer: Self-pay

## 2020-05-29 NOTE — Telephone Encounter (Signed)
I attempted to contact the patient to notify him that his medications are her and available for pick up. No answer. I left a message on the patient vm.

## 2020-05-31 ENCOUNTER — Other Ambulatory Visit: Payer: Self-pay | Admitting: Family Medicine

## 2020-05-31 DIAGNOSIS — N184 Chronic kidney disease, stage 4 (severe): Secondary | ICD-10-CM

## 2020-05-31 DIAGNOSIS — I129 Hypertensive chronic kidney disease with stage 1 through stage 4 chronic kidney disease, or unspecified chronic kidney disease: Secondary | ICD-10-CM

## 2020-05-31 MED ORDER — LISINOPRIL 2.5 MG PO TABS: 2.5000 mg | ORAL_TABLET | Freq: Every day | ORAL | 1 refills | Status: DC

## 2020-06-12 ENCOUNTER — Telehealth: Payer: Self-pay | Admitting: Family Medicine

## 2020-06-12 NOTE — Telephone Encounter (Signed)
I attempted to call the pt with recommendations from Dr. Raliegh Ip but I had to leave a message asking that he call back to schedule an appt for either tomorrow of Friday

## 2020-06-12 NOTE — Telephone Encounter (Signed)
He will need a visit to discuss further and referral to Neurology or Pain Management.  [name], [facility] 06/12/2020, 11:38 AM

## 2020-06-12 NOTE — Telephone Encounter (Signed)
Patient called to request a stronger medication for his neuropathy.  He said he is in severe pain.  Please advise.

## 2020-06-13 ENCOUNTER — Ambulatory Visit: Payer: PPO | Admitting: Family Medicine

## 2020-06-17 ENCOUNTER — Other Ambulatory Visit: Payer: Self-pay | Admitting: Family Medicine

## 2020-06-17 DIAGNOSIS — M1A39X0 Chronic gout due to renal impairment, multiple sites, without tophus (tophi): Secondary | ICD-10-CM

## 2020-07-08 ENCOUNTER — Ambulatory Visit: Payer: PPO | Admitting: Family Medicine

## 2020-07-15 ENCOUNTER — Ambulatory Visit: Payer: Self-pay

## 2020-07-15 ENCOUNTER — Other Ambulatory Visit: Payer: Self-pay | Admitting: Family Medicine

## 2020-07-15 ENCOUNTER — Ambulatory Visit: Payer: PPO | Admitting: Family Medicine

## 2020-07-15 DIAGNOSIS — R0982 Postnasal drip: Secondary | ICD-10-CM

## 2020-07-15 DIAGNOSIS — K219 Gastro-esophageal reflux disease without esophagitis: Secondary | ICD-10-CM

## 2020-07-15 MED ORDER — ESOMEPRAZOLE MAGNESIUM 40 MG PO CPDR: 40.0000 mg | DELAYED_RELEASE_CAPSULE | Freq: Two times a day (BID) | ORAL | 1 refills | Status: DC

## 2020-07-15 MED ORDER — FLUTICASONE PROPIONATE 50 MCG/ACT NA SUSP: 2.0000 | Freq: Every day | NASAL | 3 refills | Status: DC

## 2020-07-15 NOTE — Telephone Encounter (Signed)
Please notify patient  For GERD - He is on Esomeprazole '40mg'$ , for now I would double up and take 1 capsule TWICE a day. New rx will be sent to pharmacy.  For mucous, I would recommend Flonase nasal steroid to help reduce drainage and drip into throat. I will send rx to [place] 07/15/2020, 4:10 PM

## 2020-07-15 NOTE — Telephone Encounter (Signed)
Still having acid in throat  Mucous gets hung in throat all the time- nees acid gas   Reason for Disposition  [1] Patient says chest pain feels exactly the same as previously diagnosed "heartburn" AND [2] describes burning in chest AND [3] accompanying sour taste in mouth  Answer Assessment - Initial Assessment Questions 1. LOCATION: "Where does it hurt?"       Burning from acid in throat  2. RADIATION: "Does the pain go anywhere else?" (e.g., into neck, jaw, arms, back)     no 3. ONSET: "When did the chest pain begin?" (Minutes, hours or days)      Pt stated the acid reflux and burn happen all the time- excess mucus in throat 4. PATTERN "Does the pain come and go, or has it been constant since it started?"  "Does it get worse with exertion?"      'Constant" 6. SEVERITY: "How bad is the pain?"  (e.g., Scale 1-10; mild, moderate, or severe)    - MILD (1-3): doesn't interfere with normal activities     - MODERATE (4-7): interferes with normal activities or awakens from sleep    - SEVERE (8-10): excruciating pain, unable to do any normal activities       moderate 7. CARDIAC RISK FACTORS: "Do you have any history of heart problems or risk factors for heart disease?" (e.g., angina, prior heart attack; diabetes, high blood pressure, high cholesterol, smoker, or strong family history of heart disease)     Heart failure,HTN, diabetes 8. PULMONARY RISK FACTORS: "Do you have any history of lung disease?"  (e.g., blood clots in lung, asthma, emphysema, birth control pills)     Asthma, chronic restrictive lung disease 9. CAUSE: "What do you think is causing the chest pain?"     GERD 10. OTHER SYMPTOMS: "Do you have any other symptoms?" (e.g., dizziness, nausea, vomiting, sweating, fever, difficulty breathing, cough)  Protocols used: Chest Pain-A-AH

## 2020-07-15 NOTE — Telephone Encounter (Signed)
Was sent to voicemail so I left a detailed message about meds being sent to the pharmacy and I reminded him of his upcoming appt. He should not need to speak with [place] further until then.

## 2020-07-15 NOTE — Telephone Encounter (Signed)
Pt c/o GERD with acid in his throat. Pt stated he is also having gas. Pt stated he wants the doctor to order him something stronger that he is taking. No referred pain.  Pt c/o excess mucous than comes up from his stomach. Pt stated it makes him feel like he cant breathe because there is so much mucous.   This is a preexisting problem.  Pt stated he was unable to come into office today, but he would be appreciative of a sooner appointment. No earlier appointments with Webb Silversmith NP.  Note routed to office = please call any prescription to [facility].

## 2020-07-19 ENCOUNTER — Other Ambulatory Visit: Payer: Self-pay

## 2020-07-19 ENCOUNTER — Ambulatory Visit (INDEPENDENT_AMBULATORY_CARE_PROVIDER_SITE_OTHER): Payer: PPO | Admitting: Family Medicine

## 2020-07-19 VITALS — BP 140/86 | HR 78 | Ht 71.0 in | Wt 396.0 lb

## 2020-07-19 DIAGNOSIS — J984 Other disorders of lung: Secondary | ICD-10-CM | POA: Diagnosis not present

## 2020-07-19 DIAGNOSIS — I129 Hypertensive chronic kidney disease with stage 1 through stage 4 chronic kidney disease, or unspecified chronic kidney disease: Secondary | ICD-10-CM

## 2020-07-19 DIAGNOSIS — M62830 Muscle spasm of back: Secondary | ICD-10-CM

## 2020-07-19 DIAGNOSIS — E1122 Type 2 diabetes mellitus with diabetic chronic kidney disease: Secondary | ICD-10-CM

## 2020-07-19 DIAGNOSIS — N184 Chronic kidney disease, stage 4 (severe): Secondary | ICD-10-CM | POA: Diagnosis not present

## 2020-07-19 DIAGNOSIS — Z794 Long term (current) use of insulin: Secondary | ICD-10-CM

## 2020-07-19 DIAGNOSIS — Z6841 Body Mass Index (BMI) 40.0 and over, adult: Secondary | ICD-10-CM | POA: Diagnosis not present

## 2020-07-19 DIAGNOSIS — J4520 Mild intermittent asthma, uncomplicated: Secondary | ICD-10-CM | POA: Diagnosis not present

## 2020-07-19 DIAGNOSIS — K219 Gastro-esophageal reflux disease without esophagitis: Secondary | ICD-10-CM | POA: Diagnosis not present

## 2020-07-19 DIAGNOSIS — Z1211 Encounter for screening for malignant neoplasm of colon: Secondary | ICD-10-CM | POA: Diagnosis not present

## 2020-07-19 LAB — POCT GLYCOSYLATED HEMOGLOBIN (HGB A1C): Hemoglobin A1C: 7 % — AB (ref 4.0–5.6)

## 2020-07-19 MED ORDER — DEXLANSOPRAZOLE 60 MG PO CPDR
60.0000 mg | DELAYED_RELEASE_CAPSULE | Freq: Every day | ORAL | 2 refills | Status: DC
Start: 2020-07-19 — End: 2020-10-30

## 2020-07-19 MED ORDER — ALBUTEROL SULFATE HFA 108 (90 BASE) MCG/ACT IN AERS
2.0000 | INHALATION_SPRAY | RESPIRATORY_TRACT | 3 refills | Status: DC | PRN
Start: 2020-07-19 — End: 2022-09-09

## 2020-07-19 MED ORDER — CYCLOBENZAPRINE HCL 10 MG PO TABS: 10.0000 mg | ORAL_TABLET | Freq: Three times a day (TID) | ORAL | 2 refills | Status: DC | PRN

## 2020-07-19 NOTE — Progress Notes (Signed)
Subjective:    Patient ID: [name], male    DOB: November 21, 1965, 55 y.o.   MRN: [id number]  [name] is a 55 y.o. male presenting on 07/19/2020 for Diabetes, Hypertension, and Back Pain   HPI   CHRONIC DM, Type 2 w/ complication Peripheral Neuropathy CKD-IV Morbid Obesity BMI [id number] Chronic Systolic CHF Pruritus Followed by Dr [name] [facility], [facility] Due A1c today, prior 6 range Unable to return to Dr [name] due to missed apt he plans to re-schedule if possible. CBGs: no readings today Meds: Trulicity 0.'75mg'$  weekly inj (he declines dose increase), Basaglar 10u units - on financial assistance through manufacturer Reports good compliance. Tolerating well w/o side-effects Currently on ACEi He asks about dosing on his fluid medication for CHF - he takes Bumex and asks if they can increase dose. He feels better when urinates and gets excess fluid off Lifestyle: - Diet (improving) - Exercise (limited) [facility]  Denies hypoglycemia, polyuria, visual changes, numbness or tingling  GERD He describes that acid reflux and upper airway and throat is impacting him and causing difficulty with his breathing. If eating and drinking causes it to be worse. Gas, burping. Takes a lot of OTC rolaids, gas-x and other meds Taking Esomeprazole '40mg'$  BID, he has tried Omeprazole and other PPI as well  Additionally History of restrictive lung disease / morbid obesity / possible asthma He has had oxygen in past, he asks about this if needs again He is on Albuterol PRN with relief Worse is upper airway symptoms He would like to pursue pulmonology, prior referral in years past but this was declined.  Back Muscle Spasms Chronic pain On Tramadol PRN He asks about muscle spasms.  Allergies Sinuses Takes Claritin   Depression screen [facility] 2/9 07/19/2020 01/04/2020 06/27/2019  Decreased Interest 0 0 0  Down, Depressed, Hopeless 0 0 0  PHQ - 2 Score 0 0 0   Altered sleeping 0 0 -  Tired, decreased energy 1 0 -  Change in appetite 0 0 -  Feeling bad or failure about yourself  0 0 -  Trouble concentrating 0 0 -  Moving slowly or fidgety/restless 0 0 -  Suicidal thoughts 0 0 -  PHQ-9 Score 1 0 -  Difficult doing work/chores Not difficult at all Not difficult at all -  Some recent data might be hidden    Social History   Tobacco Use   Smoking status: Never   Smokeless tobacco: Never  Substance Use Topics   Alcohol use: Yes    Comment: a glass of wine and a couple beers a day   Drug use: No    Review of Systems Per HPI unless specifically indicated above     Objective:    BP 140/86   Pulse 78   Ht '5\' 11"'$  (1.803 m)   Wt (!) 396 lb (179.6 kg)   SpO2 90%   BMI 55.23 kg/m   Wt Readings from Last 3 Encounters:  07/19/20 (!) 396 lb (179.6 kg)  01/04/20 (!) 351 lb (159.2 kg)  06/27/19 (!) 369 lb 9.6 oz (167.6 kg)    Physical Exam Vitals and nursing note reviewed.  Constitutional:      General: He is not in acute distress.    Appearance: He is well-developed. He is obese. He is not diaphoretic.     Comments: Well-appearing, comfortable, cooperative  HENT:     Head: Normocephalic and atraumatic.  Eyes:  General:        Right eye: No discharge.        Left eye: No discharge.     Conjunctiva/sclera: Conjunctivae normal.  Neck:     Thyroid: No thyromegaly.  Cardiovascular:     Rate and Rhythm: Normal rate and regular rhythm.     Pulses: Normal pulses.     Heart sounds: Normal heart sounds. No murmur heard. Pulmonary:     Effort: Pulmonary effort is normal. No respiratory distress.     Breath sounds: Normal breath sounds. No wheezing or rales.  Musculoskeletal:        General: Normal range of motion.     Cervical back: Normal range of motion and neck supple.     Right lower leg: Edema present.     Left lower leg: Edema present.  Lymphadenopathy:     Cervical: No cervical adenopathy.  Skin:    General: Skin is  warm and dry.     Findings: No erythema or rash.  Neurological:     Mental Status: He is alert and oriented to person, place, and time. Mental status is at baseline.  Psychiatric:        Behavior: Behavior normal.     Comments: Well groomed, good eye contact, normal speech and thoughts   Results for orders placed or performed in visit on 07/19/20  POCT HgB A1C  Result Value Ref Range   Hemoglobin A1C 7.0 (A) 4.0 - 5.6 %   Recent Labs    01/04/20 0935 07/19/20 1055  HGBA1C 6.0* 7.0*    Diabetic Foot Exam - Simple   Simple Foot Form Diabetic Foot exam was performed with the following findings: Yes 07/19/2020 11:19 AM  Visual Inspection No deformities, no ulcerations, no other skin breakdown bilaterally: Yes Sensation Testing Intact to touch and monofilament testing bilaterally: Yes Pulse Check Posterior Tibialis and Dorsalis pulse intact bilaterally: Yes Comments Callus formation, dry skin. No ulceration        Assessment & Plan:   Problem List Items Addressed This Visit     Type 2 diabetes mellitus, controlled, with renal complications ([place]) - Primary   Relevant Orders   POCT HgB A1C (Completed)   Morbid obesity with BMI of 45.0-49.9, adult (HCC)   GERD (gastroesophageal reflux disease)   Relevant Medications   dexlansoprazole (DEXILANT) 60 MG capsule   Chronic restrictive lung disease   Relevant Orders   Ambulatory referral to Pulmonology   Benign hypertension with CKD (chronic kidney disease) stage IV (HCC)   Asthma   Relevant Medications   albuterol (VENTOLIN HFA) 108 (90 Base) MCG/ACT inhaler   Other Visit Diagnoses     Screening for colon cancer       Relevant Orders   Cologuard   Muscle spasm of back       Relevant Medications   cyclobenzaprine (FLEXERIL) 10 MG tablet       referral to Pulmonology, history of OSA, chronic dyspnea, restrictive airway disease, morbid obesity, he has difficulty with upper airway symptoms with GERD and other factors,  has history of reactive airway and asthma, history required oxygen temporarily in past after hospital now off oxygen but he still has difficulty breathing inquiring further diagnostic evaluation for his lung function  GERD Uncontrolled causing upper airway symptoms Will likely need PA / step therapy for Dexilant '60mg'$  dose once daily, goal to use high dose and titrate down if indicated. He has failed Omeprazole '40mg'$  up to twice a day, Esomeprazole  $'40mg'Z$  BID, Pantoprazole, and other antacids OTC  Back Spasms Trial rx Flexeril 5-'10mg'$  TID PRN. Okay to take with reduced renal function  CKD-IV Chronic problem He is due for follow up labs on kidney function, he request to return to Nephrology Will reach out to Dr [name] to see if their office can re-schedule him or if he would be able to transfer to one of his colleagues, patient admits he has had difficulty with keeping appointments.  Ordered Cologuard Prior order has expired.  T2DM Controlled A1c 7.0 today No change on therapy at this time.  CHF Overall fairly stable from previous, however improved on increased diuresis I advised him that I would not be able to change his diuretic regimen, this needs to be done through his Cardiology HF team or possibly Nephrology.   Orders Placed This Encounter  Procedures   Cologuard   Ambulatory referral to Pulmonology    Referral Priority:   Routine    Referral Type:   Consultation    Referral Reason:   Specialty Services Required    Requested Specialty:   Pulmonary Disease    Number of Visits Requested:   1   POCT HgB A1C     Meds ordered this encounter  Medications   dexlansoprazole (DEXILANT) 60 MG capsule    Sig: Take 1 capsule (60 mg total) by mouth daily.    Dispense:  30 capsule    Refill:  2   albuterol (VENTOLIN HFA) 108 (90 Base) MCG/ACT inhaler    Sig: Inhale 2 puffs into the lungs every 4 (four) hours as needed for wheezing or shortness of breath.    Dispense:  18 g     Refill:  3   cyclobenzaprine (FLEXERIL) 10 MG tablet    Sig: Take 1 tablet (10 mg total) by mouth 3 (three) times daily as needed for muscle spasms.    Dispense:  90 tablet    Refill:  2     Follow up plan: Return in about 3 months (around 10/19/2020).  [name], [facility] 07/19/2020, 10:42 AM

## 2020-07-19 NOTE — Patient Instructions (Addendum)
Thank you for coming to the office today.  Stop Esomeprazole, switch to Dexilant '60mg'$  dose once daily.  We will reach out to Dr Juleen China to see if he can schedule you again or one of his other partners.   [facility] [address] [place], [place] Phone: [phone]    Please schedule a Follow-up Appointment to: Return in about 3 months (around 10/19/2020).  If you have any other questions or concerns, please feel free to call the office or send a message through [place]. You may also schedule an earlier appointment if necessary.  Additionally, you may be receiving a survey about your experience at our office within a few days to 1 week by e-mail or mail. We value your feedback.  [name], DO [place]

## 2020-07-24 DIAGNOSIS — E875 Hyperkalemia: Secondary | ICD-10-CM | POA: Diagnosis not present

## 2020-07-24 DIAGNOSIS — N184 Chronic kidney disease, stage 4 (severe): Secondary | ICD-10-CM | POA: Diagnosis not present

## 2020-07-24 DIAGNOSIS — R809 Proteinuria, unspecified: Secondary | ICD-10-CM | POA: Diagnosis not present

## 2020-07-24 DIAGNOSIS — I129 Hypertensive chronic kidney disease with stage 1 through stage 4 chronic kidney disease, or unspecified chronic kidney disease: Secondary | ICD-10-CM | POA: Diagnosis not present

## 2020-07-24 DIAGNOSIS — N2581 Secondary hyperparathyroidism of renal origin: Secondary | ICD-10-CM | POA: Diagnosis not present

## 2020-07-24 DIAGNOSIS — E1122 Type 2 diabetes mellitus with diabetic chronic kidney disease: Secondary | ICD-10-CM | POA: Diagnosis not present

## 2020-08-05 ENCOUNTER — Other Ambulatory Visit: Payer: Self-pay | Admitting: Family Medicine

## 2020-08-05 DIAGNOSIS — M8949 Other hypertrophic osteoarthropathy, multiple sites: Secondary | ICD-10-CM

## 2020-08-05 DIAGNOSIS — M159 Polyosteoarthritis, unspecified: Secondary | ICD-10-CM

## 2020-08-05 DIAGNOSIS — G894 Chronic pain syndrome: Secondary | ICD-10-CM

## 2020-08-05 NOTE — Telephone Encounter (Signed)
Please review for refill. Refill not delegated per protocol. Last refill: 03/06/20 #30 with 3 refills Next OV 10/23/20

## 2020-08-05 NOTE — Telephone Encounter (Signed)
Requested medication (s) are due for refill today: Yes  Requested medication (s) are on the active medication list: Yes  Last refill:  03/06/20  Future visit scheduled: Yes  Notes to clinic:  See request.    Requested Prescriptions  Pending Prescriptions Disp Refills   traMADol (ULTRAM) 50 MG tablet [Pharmacy Med Name: TRAMADOL HCL 50 MG TABLET] 30 tablet 0    Sig: Take 1-2 tablets (50-100 mg total) by mouth every 12 (twelve) hours as needed.      Not Delegated - Analgesics:  Opioid Agonists Failed - 08/05/2020  8:39 AM      Failed - This refill cannot be delegated      Failed - Urine Drug Screen completed in last 360 days      Passed - Valid encounter within last 6 months    Recent Outpatient Visits           2 weeks ago Controlled type 2 diabetes mellitus with stage 4 chronic kidney disease, with long-term current use of insulin ([place])   [place], [name], DO   5 months ago Acute non-recurrent maxillary sinusitis   [place], DO   7 months ago Annual physical exam   [facility] [name], DO   1 year ago Controlled type 2 diabetes mellitus with stage 4 chronic kidney disease, with long-term current use of insulin [facility])   [facility] [name], DO   1 year ago Acute non-recurrent frontal sinusitis   [facility] [place], [name], DO       Future Appointments             In 2 months [place], [name], [facility], [place]   In 6 months  [facility], [facility]

## 2020-08-05 NOTE — Telephone Encounter (Signed)
Medication Refill - Medication: traMADol (ULTRAM) 50 MG tablet  Has the patient contacted their pharmacy? Yes.     (Agent: If yes, when and what did the pharmacy advise?) Contact PCP because of no refill   Preferred Pharmacy (with phone number or street name):  [place], [place] Phone:  [phone]  Fax:  [phone]      Agent: Please be advised that RX refills may take up to 3 business days. We ask that you follow-up with your pharmacy.

## 2020-08-07 ENCOUNTER — Institutional Professional Consult (permissible substitution): Payer: PPO | Admitting: Pulmonary Disease

## 2020-08-09 ENCOUNTER — Ambulatory Visit (INDEPENDENT_AMBULATORY_CARE_PROVIDER_SITE_OTHER): Payer: PPO | Admitting: Pharmacist

## 2020-08-09 DIAGNOSIS — Z794 Long term (current) use of insulin: Secondary | ICD-10-CM

## 2020-08-09 DIAGNOSIS — N184 Chronic kidney disease, stage 4 (severe): Secondary | ICD-10-CM | POA: Diagnosis not present

## 2020-08-09 DIAGNOSIS — E1122 Type 2 diabetes mellitus with diabetic chronic kidney disease: Secondary | ICD-10-CM

## 2020-08-09 NOTE — Chronic Care Management (AMB) (Signed)
Chronic Care Management Pharmacy Note  08/09/2020 Name:  [name] MRN:  [id number] DOB:  [birth year]   Subjective: [name] is an 55 y.o. year old male who is a primary patient of [name], DO.  The CCM team was consulted for assistance with disease management and care coordination needs.    Receive a call from patient requesting a call back regarding medication assistance.  Engaged with patient by telephone for follow up visit in response to provider referral for pharmacy case management and/or care coordination services.   Place call to [facility] Patient Assistance Program (PAP) to follow up regarding medication refills for patient.  Consent to Services:  The patient was given information about Chronic Care Management services, agreed to services, and gave verbal consent prior to initiation of services.  Please see initial visit note for detailed documentation.   Patient Care Team: [name], DO as PCP - General (Family Medicine) [place], [name], RPH-CPP as Pharmacist [name], LCSW as Social Worker (Licensed Clinical Social Worker) Vanita Ingles, RN as Case Manager (General Practice)  Recent office visits: Office Visit with PCP on 6/24 for follow up of diabetes, hypertension and back pain  Recent consult visits: Office Visit with [place] on 6/29  Hospital visits: None in previous 6 months  Objective:  Lab Results  Component Value Date   CREATININE 1.83 (H) 10/05/2018   CREATININE 2.21 (H) 03/05/2018   CREATININE 3.07 (H) 03/04/2018    Lab Results  Component Value Date   HGBA1C 7.0 (A) 07/19/2020   Last diabetic Eye exam:  Lab Results  Component Value Date/Time   HMDIABEYEEXA Retinopathy (A) 10/04/2018 12:00 AM    Last diabetic Foot exam: No results found for: HMDIABFOOTEX   Social History   Tobacco Use  Smoking Status Never  Smokeless Tobacco Never   BP Readings from  Last 3 Encounters:  07/19/20 140/86  01/04/20 (!) 155/97  06/27/19 132/84   Pulse Readings from Last 3 Encounters:  07/19/20 78  01/04/20 88  06/27/19 83   Wt Readings from Last 3 Encounters:  07/19/20 (!) 396 lb (179.6 kg)  01/04/20 (!) 351 lb (159.2 kg)  06/27/19 (!) 369 lb 9.6 oz (167.6 kg)    Assessment: Review of patient past medical history, allergies, medications, health status, including review of consultants reports, laboratory and other test data, was performed as part of comprehensive evaluation and provision of chronic care management services.   SDOH:  (Social Determinants of Health) assessments and interventions performed: none   CCM Care Plan  Allergies  Allergen Reactions   Talc     Medications Reviewed Today     Reviewed by [name], DO (Physician) on 07/19/20 at 1042  Med List Status: <None>   Medication Order Taking? Sig Documenting Provider Last Dose Status Informant  albuterol (PROVENTIL) (2.5 MG/3ML) 0.083% nebulizer solution [id number] Yes Take 3 mLs (2.5 mg total) by nebulization every 6 (six) hours as needed for wheezing or shortness of breath. [name], DO Taking Active   albuterol (VENTOLIN HFA) 108 (90 Base) MCG/ACT inhaler [id number] Yes Inhale 2 puffs into the lungs every 4 (four) hours as needed for wheezing or shortness of breath. [name], DO Taking Active   allopurinol (ZYLOPRIM) 100 MG tablet [id number] Yes Take 2 tablets (200 mg total) by mouth daily. [name], DO Taking Active   aspirin 81 MG chewable tablet [id number] Yes Chew 81 mg  by mouth daily. [provider] Taking Active Self  atorvastatin (LIPITOR) 20 MG tablet [id number] Yes Take 1 tablet (20 mg total) by mouth daily. [name], DO Taking Active   blood glucose meter kit and supplies KIT [id number] Yes Brand of choice; LON 99 months; check FSBS 3x a day; E11.65; disp one meter with 100 strips + 1  refill of strips [name], [name], MD Taking Active Self  Blood Glucose Monitoring Suppl (ONE TOUCH ULTRA 2) w/Device KIT [id number] Yes Use to check blood glucose up to 3 x daily [name], DO Taking Active   bumetanide (BUMEX) 1 MG tablet [id number] Yes Take by mouth. Take 2 tablets (2 mg total) by mouth in the morning and 2 tablets (2 mg total) at noon and 2 tablets (2 mg total) in the evening. [provider] Taking Active   calcitRIOL (ROCALTROL) 0.5 MCG capsule [id number] Yes Take by mouth. Take 2 capsules (1 mcg total) by mouth once daily [provider] Taking Active   carvedilol (COREG) 25 MG tablet [id number] Yes Take 2 tablets (50 mg total) by mouth 2 (two) times daily with a meal. [name], [name], DO Taking Active   Dulaglutide (TRULICITY) 3.15 VV/6.1YW SOPN [id number] Yes Inject 0.75 mg into the skin once a week. [provider] Taking Active            Med Note [name], Anagha Loseke A   Wed Jul 20, 2018  5:42 PM) Taking on Fridays  esomeprazole (NEXIUM) 40 MG capsule [id number] Yes Take 1 capsule (40 mg total) by mouth 2 (two) times daily before a meal. [place], [name], DO Taking Active   fluticasone (FLONASE) 50 MCG/ACT nasal spray [id number] Yes Place 2 sprays into both nostrils daily. Use for 4-6 weeks then stop and use seasonally or as needed. [name], DO Taking Active   hydrOXYzine (VISTARIL) 25 MG capsule [id number] Yes Take 1 capsule (25 mg total) by mouth every 8 (eight) hours as needed for itching. [name], DO Taking Active   Insulin Glargine [place]) 100 UNIT/ML SOPN [id number] Yes Inject 10 Units into the skin daily. Submitted to [employer] for assistance [name], DO Taking Active Self           Med Note [name], Aleksia Freiman A   Mon Jan 15, 2020 10:34 AM)    Insulin Pen Needle (B-D UF III MINI PEN NEEDLES) 31G X 5 MM MISC [id number] Yes 1 each by Does not apply route daily.  Use once daily with Victoza. [name], DO Taking Active Self  lisinopril (ZESTRIL) 2.5 MG tablet [id number] Yes Take 1 tablet (2.5 mg total) by mouth daily. [name], DO Taking Active   loratadine (CLARITIN) 10 MG tablet [id number] Yes Take 1 tablet (10 mg total) by mouth daily. [name], DO Taking Active   magnesium oxide (MAG-OX) 400 (241.3 Mg) MG tablet [id number] Yes Take 1 tablet by mouth daily. [provider] Taking Active   montelukast (SINGULAIR) 10 MG tablet [id number] Yes Take 1 tablet (10 mg total) by mouth at bedtime. [name], DO Taking Active   OneTouch Delica Lancets 99I MISC [id number] Yes Use to check blood sugar 3 x daily [name], DO Taking Active   [facility] ULTRA test strip [id number] Yes Check blood sugar 3 x daily [name], DO Taking Active   simethicone (MYLICON) 902 MG chewable tablet [id number] Yes Chew 125 mg by mouth every 6 (  six) hours as needed for flatulence. [provider] Taking Active   traMADol (ULTRAM) 50 MG tablet [id number] Yes Take 1-2 tablets (50-100 mg total) by mouth every 12 (twelve) hours as needed. [name], DO Taking Active   vitamin C (ASCORBIC ACID) 500 MG tablet [id number] Yes Take 1,000 mg by mouth daily. [provider] Taking Active Self           Med Note [name], [name]   Tue Apr 09, 2015  8:28 AM)              Patient Active Problem List   Diagnosis Date Noted   Background diabetic retinopathy ([place]) 10/17/2018   Chronic pain syndrome 01/21/2017   Chronic restrictive lung disease 02/19/2016   Primary osteoarthritis involving multiple joints 01/24/2016   Asthma 09/03/2015   Elevated transaminase level 06/04/2015   Excessive daytime sleepiness 05/07/2015   GERD (gastroesophageal reflux disease) 05/07/2015   Noncompliance 04/09/2015   Allergic rhinitis 04/09/2015   Hyperlipidemia associated with type 2  diabetes mellitus ([place]) 02/15/2015   Vitamin D deficiency 01/17/2015   Elevated CO2 level 01/17/2015   Tobacco abuse, episodic 01/17/2015   Insomnia 01/17/2015   Current tobacco use 01/11/2015   Benign hypertension with CKD (chronic kidney disease) stage IV ([place]) 07/10/2014   Proteinuria 07/10/2014   Type 2 diabetes mellitus, controlled, with renal complications ([place]) 91/69/4503   CKD (chronic kidney disease), stage IV ([place]) 07/10/2014   Secondary renal hyperparathyroidism ([place]) 07/10/2014   Cardiomyopathy, dilated, nonischemic ([place])    Morbid obesity with BMI of 45.0-49.9, adult ([place])    Gout    Chronic systolic heart failure ([place]) 04/29/2011    Immunization History  Administered Date(s) Administered   Influenza, Seasonal, Injecte, Preservative Fre 11/30/2007   Influenza,inj,Quad PF,6+ Mos 01/17/2015, 10/15/2016   Influenza,inj,quad, With Preservative 11/11/2017   Influenza-Unspecified 01/03/2014, 10/22/2015, 11/09/2017   PPD Test 07/02/2015   Pneumococcal Polysaccharide-23 01/17/2015   Td 02/14/2003    Conditions to be addressed/monitored: HTN, HLD, and DMII  Care Plan : PharmD - Med Assistance  Updates made by [name], RPH-CPP since 08/09/2020 12:00 AM     Problem: Disease Progression      Long-Range Goal: Disease Progression Prevented or Minimized   Start Date: 04/03/2020  Expected End Date: 07/02/2020  Recent Progress: On track  Priority: High  Note:   Current Barriers:  Financial Barriers  Patient APPROVED to receive Trulicity and Engineer, agricultural through [employer] patient assistance program through 01/25/2021 Lack of blood sugar or blood pressure results for the clinical team   Pharmacist Clinical Goal(s):  Over the next 90 days, patient will achieve adherence to monitoring guidelines and medication adherence to achieve therapeutic efficacy through collaboration with PharmD and provider.    Interventions: 1:1 collaboration with [name], DO  regarding development and update of comprehensive plan of care as evidenced by provider attestation and co-signature Inter-disciplinary care team collaboration (see longitudinal plan of care) Receive voicemail from patient requesting call back regarding medication assistance. Return call to patient Perform chart review Patient seen for Office Visit with PCP on 6/24 for follow up of diabetes, hypertension and back pain. Provider advised: Referral to Pulmonology placed To stop Esomeprazole, switch to Dexilant 71m dose once daily. Reschedule with Nephrologist, Dr. KJuleen ChinaOffice [facility]. Per notes: Provider noted patient to have anasarca Provider discussed dialysis with patient Patient was 40 lbs over his previous clinic weight Provider noted patient had not been  taking metolazone as needed as directed Patient advised to start taking metolazone and continue bumetanide Today patient reports has been following direction from Nephrologist, Dr. Juleen China and taking bumetanide and metolazone following directions from provider Reports is monitoring his weight at home as directed and has noticed improvement/fluid loss Again advise patient to follow directions from Nephrologist and Cardiologist, including taking medications as directed, monitoring daily weights and fluid/salt recommendations and to follow up with Nephrologist and Cardiologist for values outside of established parameters or new/worsening symptoms Reports has been reducing salt/sodium in his diet Confirms planning to attend upcoming follow up appointment with Dr. Juleen China on 7/25 Patient denies monitoring home blood pressure Note CM Pharmacist hs repeatedly encouraged patient to check BP as recommended by providers and upper arm BP monitor provided. However, patient has declined to monitor BP at home. Again encourage/discuss rational for patient monitoring home BP/HR and keep log of  results Confirms rescheduled and plans to attend upcoming appointment with Pulmonology on 7/28 Patient denies having picked up/started Dexilant 60 mg daily as directed by PCP, still taking esomprazole 40 mg BID Note Dexilant is a tier 3 option through patient's plan Patient states that he will follow up with pharmacy to have Rx for Dexilant filled and will follow up with CM Pharmacist/office for further questions/needs Unable to complete medication review today as patient is not home  Type 2 DM Current treatment: Trulicity 4.85 mg weekly Basaglar 10 units daily Reports recent home blood sugar readings ranging: 115-128 However, reports glucometer stopped working again. Reports has a couple of meters and will bring to pharmacy to determine what supplies he needs when picks up medication refill Encourage patient to follow up with office if in need of additional Rx or support Again counsel on importance of having working meter and monitoring home blood sugar Denies recent hypoglycemia Have counseled on s/s of low blood sugars, how to manage lows and to contact office if is having low readings Have discussed importance of eating regular well-balanced meals throughout the day, avoiding skipping meals  Medication Assistance: Patient reports called [employer] Patient Assistance Program (PAP) and was advised that [place] would be refilled, but that Trulicity Rx was too soon to refill. Reports has 3 pens of Trulicity remaining Place call to [employer] PAP. Speak with representative Shelita who advises: Patient's Basaglar Rx refill will be shipped to provider office for patient, to arrive on 4/62 Patient's Trulicity Rx refill was last filled on 5/3 for #16 pens (#112 day supply). Next refill to be sent on the week of 7/30 Follow up with patient again, who states that he will check his fridge to see if he has another box of Trulicity  Patient Goals/Self-Care Activities Over the next 90 days, patient will:  -  check glucose, document, and provide at future appointments - collaborate with provider on medication access solutions - attend medical appointments as scheduled:  Next appointment with Nephrology on 7/25 Next appointment with Pulmonology on 7/28 Next appointment with Cardiology on 8/8   Follow Up Plan:    1) CM Pharmacist will outreach to patient by telephone in December as scheduled     Medication Assistance: Patient APPROVED to receive Trulicity and Engineer, agricultural through [place] patient assistance program through 01/25/2021  Patient's preferred pharmacy is:  [place], [place] - [address] [place] [address] Phone: [phone] Fax: [phone]   Follow Up:  Patient agrees to Care Plan and Follow-up.  Grayland Ormond  [name], PharmD, [place], [place] [phone]

## 2020-08-09 NOTE — Patient Instructions (Signed)
Visit Information  PATIENT GOALS:  Goals Addressed             This Visit's Progress    Pharmacy Goals       Our goal A1c is less than 7%. This corresponds with fasting sugars less than 130 and 2 hour after meal sugars less than 180. Please check your blood sugar and keep a log of the results  Our goal bad cholesterol, or LDL, is less than 70 . This is why it is important to continue taking your atorvastatin.  Please check your daily weights, as well as follow fluid and salt instructions, as directed by your kidney doctor. Please keep a record of daily weights and bring this with you to your medical appointments  Please check your home blood pressure, keep a log of the results and bring this with you to your medical appointments.  Feel free to call me with any questions or concerns. I look forward to our next call!  [name], PharmD, [place] [phone]        The patient verbalized understanding of instructions, educational materials, and care plan provided today and declined offer to receive copy of patient instructions, educational materials, and care plan.   CM Pharmacist will outreach to patient by telephone in December as scheduled

## 2020-08-19 DIAGNOSIS — N184 Chronic kidney disease, stage 4 (severe): Secondary | ICD-10-CM | POA: Diagnosis not present

## 2020-08-19 DIAGNOSIS — R809 Proteinuria, unspecified: Secondary | ICD-10-CM | POA: Diagnosis not present

## 2020-08-19 DIAGNOSIS — I509 Heart failure, unspecified: Secondary | ICD-10-CM | POA: Diagnosis not present

## 2020-08-19 DIAGNOSIS — I129 Hypertensive chronic kidney disease with stage 1 through stage 4 chronic kidney disease, or unspecified chronic kidney disease: Secondary | ICD-10-CM | POA: Diagnosis not present

## 2020-08-19 DIAGNOSIS — N2581 Secondary hyperparathyroidism of renal origin: Secondary | ICD-10-CM | POA: Diagnosis not present

## 2020-08-19 DIAGNOSIS — E875 Hyperkalemia: Secondary | ICD-10-CM | POA: Diagnosis not present

## 2020-08-19 DIAGNOSIS — E1122 Type 2 diabetes mellitus with diabetic chronic kidney disease: Secondary | ICD-10-CM | POA: Diagnosis not present

## 2020-08-22 ENCOUNTER — Institutional Professional Consult (permissible substitution): Payer: PPO | Admitting: Internal Medicine

## 2020-08-22 NOTE — Progress Notes (Deleted)
[name], male    DOB: 23-Oct-1965,    MRN: [id number]   Brief patient profile:  59 yobm *** referred to pulmonary clinic 08/22/2020 for doe     History of Present Illness  08/22/2020  Pulmonary/ 1st office eval/ [name] / [employer]  No chief complaint on file.    Dyspnea:  *** Cough: *** Sleep: *** SABA use:   Past Medical History:  Diagnosis Date   Allergy    Asthma    Gout    Hypertension    Morbid obesity ([place])     Outpatient Medications Prior to Visit  Medication Sig Dispense Refill   albuterol (PROVENTIL) (2.5 MG/3ML) 0.083% nebulizer solution Take 3 mLs (2.5 mg total) by nebulization every 6 (six) hours as needed for wheezing or shortness of breath. 150 mL 3   albuterol (VENTOLIN HFA) 108 (90 Base) MCG/ACT inhaler Inhale 2 puffs into the lungs every 4 (four) hours as needed for wheezing or shortness of breath. 18 g 3   allopurinol (ZYLOPRIM) 100 MG tablet Take 2 tablets (200 mg total) by mouth daily. 180 tablet 1   aspirin 81 MG chewable tablet Chew 81 mg by mouth daily.     atorvastatin (LIPITOR) 20 MG tablet Take 1 tablet (20 mg total) by mouth daily. 90 tablet 1   blood glucose meter kit and supplies KIT Brand of choice; LON 99 months; check FSBS 3x a day; E11.65; disp one meter with 100 strips + 1 refill of strips 1 each 0   Blood Glucose Monitoring Suppl (ONE TOUCH ULTRA 2) w/Device KIT Use to check blood glucose up to 3 x daily 1 kit 0   bumetanide (BUMEX) 1 MG tablet Take by mouth. Take 2 tablets (2 mg total) by mouth in the morning and 2 tablets (2 mg total) at noon and 2 tablets (2 mg total) in the evening.     calcitRIOL (ROCALTROL) 0.5 MCG capsule Take by mouth. Take 2 capsules (1 mcg total) by mouth once daily     carvedilol (COREG) 25 MG tablet Take 2 tablets (50 mg total) by mouth 2 (two) times daily with a meal. 360 tablet 3   cyclobenzaprine (FLEXERIL) 10 MG tablet Take 1 tablet (10 mg total) by mouth 3 (three) times daily as needed for  muscle spasms. 90 tablet 2   dexlansoprazole (DEXILANT) 60 MG capsule Take 1 capsule (60 mg total) by mouth daily. 30 capsule 2   Dulaglutide (TRULICITY) 7.03 JK/0.9FG SOPN Inject 0.75 mg into the skin once a week.     fluticasone (FLONASE) 50 MCG/ACT nasal spray Place 2 sprays into both nostrils daily. Use for 4-6 weeks then stop and use seasonally or as needed. 16 g 3   hydrOXYzine (VISTARIL) 25 MG capsule Take 1 capsule (25 mg total) by mouth every 8 (eight) hours as needed for itching. 60 capsule 1   Insulin Glargine (BASAGLAR KWIKPEN) 100 UNIT/ML SOPN Inject 10 Units into the skin daily. Submitted to [employer] for assistance     Insulin Pen Needle (B-D UF III MINI PEN NEEDLES) 31G X 5 MM MISC 1 each by Does not apply route daily. Use once daily with Victoza. 100 each 12   lisinopril (ZESTRIL) 2.5 MG tablet Take 1 tablet (2.5 mg total) by mouth daily. 90 tablet 1   loratadine (CLARITIN) 10 MG tablet Take 1 tablet (10 mg total) by mouth daily. 90 tablet 3   magnesium oxide (MAG-OX) 400 (241.3 Mg) MG tablet  Take 1 tablet by mouth daily.     metolazone (ZAROXOLYN) 5 MG tablet Take 5 mg by mouth daily.     montelukast (SINGULAIR) 10 MG tablet Take 1 tablet (10 mg total) by mouth at bedtime. 90 tablet 1   OneTouch Delica Lancets 97D MISC Use to check blood sugar 3 x daily 300 each 3   ONETOUCH ULTRA test strip Check blood sugar 3 x daily 300 each 3   simethicone (MYLICON) 532 MG chewable tablet Chew 125 mg by mouth every 6 (six) hours as needed for flatulence.     traMADol (ULTRAM) 50 MG tablet Take 1-2 tablets (50-100 mg total) by mouth every 12 (twelve) hours as needed. 30 tablet 2   vitamin C (ASCORBIC ACID) 500 MG tablet Take 1,000 mg by mouth daily.     No facility-administered medications prior to visit.     Objective:     There were no vitals taken for this visit.         Assessment   No problem-specific Assessment & Plan notes found for this encounter.     Christinia Gully,  MD 08/22/2020

## 2020-08-29 ENCOUNTER — Other Ambulatory Visit: Payer: Self-pay | Admitting: Family Medicine

## 2020-08-29 DIAGNOSIS — M159 Polyosteoarthritis, unspecified: Secondary | ICD-10-CM

## 2020-08-29 DIAGNOSIS — M8949 Other hypertrophic osteoarthropathy, multiple sites: Secondary | ICD-10-CM

## 2020-08-29 DIAGNOSIS — G894 Chronic pain syndrome: Secondary | ICD-10-CM

## 2020-08-29 NOTE — Telephone Encounter (Signed)
Requested medication (s) are due for refill today: Yes  Requested medication (s) are on the active medication list: Yes  Last refill:  08/05/20  Future visit scheduled: Yes  Notes to clinic:  See request.    Requested Prescriptions  Pending Prescriptions Disp Refills   traMADol (ULTRAM) 50 MG tablet [Pharmacy Med Name: TRAMADOL HCL 50 MG TABLET] 30 tablet 0    Sig: Take 1-2 tablets (50-100 mg total) by mouth every 12 (twelve) hours as needed.      Not Delegated - Analgesics:  Opioid Agonists Failed - 08/29/2020 10:48 AM      Failed - This refill cannot be delegated      Failed - Urine Drug Screen completed in last 360 days      Passed - Valid encounter within last 6 months    Recent Outpatient Visits           1 month ago Controlled type 2 diabetes mellitus with stage 4 chronic kidney disease, with long-term current use of insulin ([place])   [facility] [place], [name], DO   6 months ago Acute non-recurrent maxillary sinusitis   [place], DO   7 months ago Annual physical exam   [facility] [name], DO   1 year ago Controlled type 2 diabetes mellitus with stage 4 chronic kidney disease, with long-term current use of insulin [facility])   [facility] [name], DO   1 year ago Acute non-recurrent frontal sinusitis   [facility] [place], [name], DO       Future Appointments             In 1 month [place], [facility], [place]   In 5 months  [facility], [facility]

## 2020-08-30 NOTE — Telephone Encounter (Signed)
Patient checking on the status of traMADol (ULTRAM) 50 MG tablet refill request. Patient would like to know if PCP can send over Rx today.    [place], [place] - [place] Phone:  [phone]  Fax:  [phone]

## 2020-10-22 ENCOUNTER — Other Ambulatory Visit: Payer: Self-pay | Admitting: Family Medicine

## 2020-10-22 DIAGNOSIS — E1169 Type 2 diabetes mellitus with other specified complication: Secondary | ICD-10-CM

## 2020-10-23 ENCOUNTER — Ambulatory Visit: Payer: PPO | Admitting: Family Medicine

## 2020-10-25 ENCOUNTER — Telehealth: Payer: Self-pay

## 2020-10-25 NOTE — Telephone Encounter (Signed)
The pt was notified that he can get his refills on his medication when he comes to his appt on 10/30/20.

## 2020-10-25 NOTE — Telephone Encounter (Signed)
Copied from [place] [id number]. Topic: General - Other >> Oct 23, 2020  8:35 AM Tessa Lerner A wrote: Reason for CRM: The patient would like to be contacted by a member of clinical staff when possible  The patient was unable to make their appt today 10/23/20 at 8:20 AM   The patient is concerned with consistently taking their tramadol   Please contact further when possible

## 2020-10-30 ENCOUNTER — Other Ambulatory Visit: Payer: Self-pay

## 2020-10-30 ENCOUNTER — Encounter: Payer: Self-pay | Admitting: Family Medicine

## 2020-10-30 ENCOUNTER — Other Ambulatory Visit: Payer: Self-pay | Admitting: Family Medicine

## 2020-10-30 ENCOUNTER — Ambulatory Visit (INDEPENDENT_AMBULATORY_CARE_PROVIDER_SITE_OTHER): Payer: PPO | Admitting: Family Medicine

## 2020-10-30 VITALS — BP 111/72 | HR 80 | Ht 71.0 in | Wt 335.5 lb

## 2020-10-30 DIAGNOSIS — Z794 Long term (current) use of insulin: Secondary | ICD-10-CM

## 2020-10-30 DIAGNOSIS — I420 Dilated cardiomyopathy: Secondary | ICD-10-CM | POA: Diagnosis not present

## 2020-10-30 DIAGNOSIS — G894 Chronic pain syndrome: Secondary | ICD-10-CM

## 2020-10-30 DIAGNOSIS — J3089 Other allergic rhinitis: Secondary | ICD-10-CM

## 2020-10-30 DIAGNOSIS — M150 Primary generalized (osteo)arthritis: Secondary | ICD-10-CM

## 2020-10-30 DIAGNOSIS — I5022 Chronic systolic (congestive) heart failure: Secondary | ICD-10-CM | POA: Diagnosis not present

## 2020-10-30 DIAGNOSIS — I129 Hypertensive chronic kidney disease with stage 1 through stage 4 chronic kidney disease, or unspecified chronic kidney disease: Secondary | ICD-10-CM | POA: Diagnosis not present

## 2020-10-30 DIAGNOSIS — J0110 Acute frontal sinusitis, unspecified: Secondary | ICD-10-CM

## 2020-10-30 DIAGNOSIS — M1A39X0 Chronic gout due to renal impairment, multiple sites, without tophus (tophi): Secondary | ICD-10-CM

## 2020-10-30 DIAGNOSIS — E1169 Type 2 diabetes mellitus with other specified complication: Secondary | ICD-10-CM

## 2020-10-30 DIAGNOSIS — N184 Chronic kidney disease, stage 4 (severe): Secondary | ICD-10-CM | POA: Diagnosis not present

## 2020-10-30 DIAGNOSIS — E1122 Type 2 diabetes mellitus with diabetic chronic kidney disease: Secondary | ICD-10-CM | POA: Diagnosis not present

## 2020-10-30 DIAGNOSIS — M159 Polyosteoarthritis, unspecified: Secondary | ICD-10-CM

## 2020-10-30 MED ORDER — ATORVASTATIN CALCIUM 20 MG PO TABS: 20.0000 mg | ORAL_TABLET | Freq: Every day | ORAL | 3 refills | Status: DC

## 2020-10-30 MED ORDER — ALLOPURINOL 100 MG PO TABS: 200.0000 mg | ORAL_TABLET | Freq: Every day | ORAL | 3 refills | Status: DC

## 2020-10-30 MED ORDER — TRAMADOL HCL 50 MG PO TABS: 50.0000 mg | ORAL_TABLET | Freq: Two times a day (BID) | ORAL | 2 refills | Status: DC | PRN

## 2020-10-30 MED ORDER — AZITHROMYCIN 250 MG PO TABS: ORAL_TABLET | ORAL | 0 refills | Status: DC

## 2020-10-30 MED ORDER — MONTELUKAST SODIUM 10 MG PO TABS: 10.0000 mg | ORAL_TABLET | Freq: Every day | ORAL | 3 refills | Status: DC

## 2020-10-30 MED ORDER — IPRATROPIUM BROMIDE 0.06 % NA SOLN: 2.0000 | Freq: Four times a day (QID) | NASAL | 3 refills | Status: DC

## 2020-10-30 MED ORDER — LISINOPRIL 2.5 MG PO TABS: 2.5000 mg | ORAL_TABLET | Freq: Every day | ORAL | 3 refills | Status: DC

## 2020-10-30 NOTE — Assessment & Plan Note (Signed)
Stable, euvolemic at baseline on compression stockings Currently well compensated, chronic systolic CHF secondary to non-ischemic cardiomyopathy Followed by Cardiology On med management low dose ACEi Lisinopril 2.'5mg'$ , Carvedilol, Bumex Has declined ICD in past

## 2020-10-30 NOTE — Assessment & Plan Note (Signed)
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 For now continue Trulicity 0.'75mg'$  weekly injection - given Basaglar supply from PAP program, 2 boxes, 10u daily 2. Encourage improved lifestyle - low carb, low sugar diet, reduce portion size, continue improving regular exercise 3. Check CBG, bring log to next visit for review 4. Continue ASA, ACEi, Statin

## 2020-10-30 NOTE — Patient Instructions (Addendum)
Thank you for coming to the office today.  Start Azithromycin Z pak (antibiotic) 2 tabs day 1, then 1 tab x 4 days, complete entire course even if improved  Start Atrovent nasal spray decongestant 2 sprays in each nostril up to 4 times daily for 7 days   Please schedule a Follow-up Appointment to: Return in about 4 months (around 03/02/2021) for 4 month follow-up DM A1c.  If you have any other questions or concerns, please feel free to call the office or send a message through [place]. You may also schedule an earlier appointment if necessary.  Additionally, you may be receiving a survey about your experience at our office within a few days to 1 week by e-mail or mail. We value your feedback.  [name], DO [place]

## 2020-10-30 NOTE — Assessment & Plan Note (Signed)
Stable chronic joint pain OA/DJD Contraindicated NSAID in CKD IV PDMP reviewed Renew Tramadol

## 2020-10-30 NOTE — Progress Notes (Signed)
Subjective:    Patient ID: [name], male    DOB: 1965/10/13, 55 y.o.   MRN: [id number]  [name] is a 55 y.o. male presenting on 10/30/2020 for Diabetes   HPI  Sinusitis Allergies Reports symptoms triggered by environmental allergies and also talc powder has triggered some sinus symptoms. He has used Zpak in past for sinus symptoms He has used decongestants PRN in past.  Type 2 Diabetes HTN CKD IV Chronic Cardiomyopathy dilated He is following with Dr Juleen China [facility], he has lost 60+ lbs of water weight on diuretic, with improvement overall. Last A1c 7.0 (07/19/20) He is taking Tramadol PRN for pain, since cannot take NSAID Followed by Cardiology, on diuretic therapy  Health Maintenance: Future Flu Shot and COVID booster when ready.  Depression screen [facility] 2/9 07/19/2020 01/04/2020 06/27/2019  Decreased Interest 0 0 0  Down, Depressed, Hopeless 0 0 0  PHQ - 2 Score 0 0 0  Altered sleeping 0 0 -  Tired, decreased energy 1 0 -  Change in appetite 0 0 -  Feeling bad or failure about yourself  0 0 -  Trouble concentrating 0 0 -  Moving slowly or fidgety/restless 0 0 -  Suicidal thoughts 0 0 -  PHQ-9 Score 1 0 -  Difficult doing work/chores Not difficult at all Not difficult at all -  Some recent data might be hidden    Social History   Tobacco Use   Smoking status: Never   Smokeless tobacco: Never  Substance Use Topics   Alcohol use: Yes    Comment: a glass of wine and a couple beers a day   Drug use: No    Review of Systems Per HPI unless specifically indicated above     Objective:    BP 111/72   Pulse 80   Ht '5\' 11"'$  (1.803 m)   Wt (!) 335 lb 8 oz (152.2 kg)   SpO2 95%   BMI 46.79 kg/m   Wt Readings from Last 3 Encounters:  10/30/20 (!) 335 lb 8 oz (152.2 kg)  07/19/20 (!) 396 lb (179.6 kg)  01/04/20 (!) 351 lb (159.2 kg)    Physical Exam Vitals and nursing note reviewed.  Constitutional:      General: He is not in acute distress.     Appearance: He is well-developed. He is obese. He is not diaphoretic.     Comments: Well-appearing, comfortable, cooperative  HENT:     Head: Normocephalic and atraumatic.  Eyes:     General:        Right eye: No discharge.        Left eye: No discharge.     Conjunctiva/sclera: Conjunctivae normal.  Neck:     Thyroid: No thyromegaly.  Cardiovascular:     Rate and Rhythm: Normal rate and regular rhythm.     Pulses: Normal pulses.     Heart sounds: Normal heart sounds. No murmur heard. Pulmonary:     Effort: Pulmonary effort is normal. No respiratory distress.     Breath sounds: Normal breath sounds. No wheezing or rales.  Musculoskeletal:        General: Normal range of motion.     Cervical back: Normal range of motion and neck supple.     Right lower leg: No edema.     Left lower leg: No edema.  Lymphadenopathy:     Cervical: No cervical adenopathy.  Skin:    General: Skin is warm and dry.  Findings: No erythema or rash.  Neurological:     Mental Status: He is alert and oriented to person, place, and time. Mental status is at baseline.  Psychiatric:        Behavior: Behavior normal.     Comments: Well groomed, good eye contact, normal speech and thoughts     Results for orders placed or performed in visit on 07/19/20  POCT HgB A1C  Result Value Ref Range   Hemoglobin A1C 7.0 (A) 4.0 - 5.6 %      Assessment & Plan:   Problem List Items Addressed This Visit     Type 2 diabetes mellitus, controlled, with renal complications (HCC)    Last A1c controlled On GLP1 Complications - CKD-IV, DM retinopathy Also complicated by hypoglycemia episodes Failed Bydureon (unable to operate pen)  Plan:  1. For now continue Trulicity 0.'75mg'$  weekly injection - given Basaglar supply from PAP program, 2 boxes, 10u daily 2. Encourage improved lifestyle - low carb, low sugar diet, reduce portion size, continue improving regular exercise 3. Check CBG, bring log to next visit for  review 4. Continue ASA, ACEi, Statin      CKD (chronic kidney disease), stage IV (HCC)    Stable CKD-IV Followed by [facility] Dr Juleen China Remains on allopurinol lower dosage for gout prophylaxis On low dose ACEi On diuretics per [facility] CHF Continue improved hydration      Chronic systolic heart failure (HCC)    Stable, euvolemic at baseline on compression stockings Currently well compensated, chronic systolic CHF secondary to non-ischemic cardiomyopathy Followed by Cardiology On med management low dose ACEi Lisinopril 2.'5mg'$ , Carvedilol, Bumex Has declined ICD in past      Chronic pain syndrome    Stable chronic joint pain OA/DJD Contraindicated NSAID in CKD IV PDMP reviewed Renew Tramadol      Cardiomyopathy, dilated, nonischemic (HCC)    Chronic underlying etiology for CHF See A&P For Systolic CHF      Benign hypertension with CKD (chronic kidney disease) stage IV (HCC)    Chronic HTN, controlled Secondary complication CKD-IV Followed by [facility] Dr Juleen China, Cardilogy On ACEi low dose BB diuretics      Other Visit Diagnoses     Acute non-recurrent frontal sinusitis    -  Primary   Relevant Medications   azithromycin (ZITHROMAX Z-PAK) 250 MG tablet   ipratropium (ATROVENT) 0.06 % nasal spray       Acute sinusitis Maxillary sinus pain pressure, 1 month Trial on Zpak course and Atrovent nasal  Meds ordered this encounter  Medications   azithromycin (ZITHROMAX Z-PAK) 250 MG tablet    Sig: Take 2 tabs ('500mg'$  total) on Day 1. Take 1 tab ('250mg'$ ) daily for next 4 days.    Dispense:  6 tablet    Refill:  0   ipratropium (ATROVENT) 0.06 % nasal spray    Sig: Place 2 sprays into both nostrils 4 (four) times daily. As needed for 1-2 weeks.    Dispense:  15 mL    Refill:  3     Follow up plan: Return in about 4 months (around 03/02/2021) for 4 month follow-up DM A1c.   [name], [facility] 10/30/2020, 9:17  AM

## 2020-10-30 NOTE — Assessment & Plan Note (Signed)
Chronic underlying etiology for CHF See A&P For Systolic CHF

## 2020-10-30 NOTE — Assessment & Plan Note (Signed)
Stable CKD-IV Followed by [facility] Dr Juleen China Remains on allopurinol lower dosage for gout prophylaxis On low dose ACEi On diuretics per [facility] CHF Continue improved hydration

## 2020-10-30 NOTE — Assessment & Plan Note (Signed)
Chronic HTN, controlled Secondary complication CKD-IV Followed by [facility] Dr Juleen China, Cardilogy On ACEi low dose BB diuretics

## 2020-11-13 DIAGNOSIS — N184 Chronic kidney disease, stage 4 (severe): Secondary | ICD-10-CM | POA: Diagnosis not present

## 2020-11-13 DIAGNOSIS — I509 Heart failure, unspecified: Secondary | ICD-10-CM | POA: Diagnosis not present

## 2020-11-13 DIAGNOSIS — N2581 Secondary hyperparathyroidism of renal origin: Secondary | ICD-10-CM | POA: Diagnosis not present

## 2020-11-13 DIAGNOSIS — I129 Hypertensive chronic kidney disease with stage 1 through stage 4 chronic kidney disease, or unspecified chronic kidney disease: Secondary | ICD-10-CM | POA: Diagnosis not present

## 2020-11-13 DIAGNOSIS — E1122 Type 2 diabetes mellitus with diabetic chronic kidney disease: Secondary | ICD-10-CM | POA: Diagnosis not present

## 2020-11-13 DIAGNOSIS — R809 Proteinuria, unspecified: Secondary | ICD-10-CM | POA: Diagnosis not present

## 2020-11-13 DIAGNOSIS — E875 Hyperkalemia: Secondary | ICD-10-CM | POA: Diagnosis not present

## 2020-12-23 DIAGNOSIS — E785 Hyperlipidemia, unspecified: Secondary | ICD-10-CM | POA: Diagnosis not present

## 2020-12-23 DIAGNOSIS — I50813 Acute on chronic right heart failure: Secondary | ICD-10-CM | POA: Diagnosis not present

## 2020-12-23 DIAGNOSIS — E0800 Diabetes mellitus due to underlying condition with hyperosmolarity without nonketotic hyperglycemic-hyperosmolar coma (NKHHC): Secondary | ICD-10-CM | POA: Diagnosis not present

## 2020-12-23 DIAGNOSIS — N184 Chronic kidney disease, stage 4 (severe): Secondary | ICD-10-CM | POA: Diagnosis not present

## 2020-12-23 DIAGNOSIS — Z6841 Body Mass Index (BMI) 40.0 and over, adult: Secondary | ICD-10-CM | POA: Diagnosis not present

## 2020-12-23 DIAGNOSIS — I5022 Chronic systolic (congestive) heart failure: Secondary | ICD-10-CM | POA: Diagnosis not present

## 2020-12-23 DIAGNOSIS — I10 Essential (primary) hypertension: Secondary | ICD-10-CM | POA: Diagnosis not present

## 2020-12-30 ENCOUNTER — Ambulatory Visit (INDEPENDENT_AMBULATORY_CARE_PROVIDER_SITE_OTHER): Payer: PPO | Admitting: Pharmacist

## 2020-12-30 DIAGNOSIS — N184 Chronic kidney disease, stage 4 (severe): Secondary | ICD-10-CM

## 2020-12-30 NOTE — Chronic Care Management (AMB) (Signed)
Chronic Care Management CCM Pharmacy Note  12/30/2020 Name:  [name] MRN:  [id number] DOB:  11/10/1965   Subjective: [name] is an 55 y.o. year old male who is a primary patient of [name], DO.  The CCM team was consulted for assistance with disease management and care coordination needs.    Engaged with patient by telephone for follow up visit for pharmacy case management and/or care coordination services.   Objective:  Medications Reviewed Today     Reviewed by Rennis Petty, RPH-CPP (Pharmacist) on 12/30/20 at 0955  Med List Status: <None>   Medication Order Taking? Sig Documenting Provider Last Dose Status Informant  albuterol (PROVENTIL) (2.5 MG/3ML) 0.083% nebulizer solution [id number]  Take 3 mLs (2.5 mg total) by nebulization every 6 (six) hours as needed for wheezing or shortness of breath. [name], [name], DO  Active   albuterol (VENTOLIN HFA) 108 (90 Base) MCG/ACT inhaler [id number]  Inhale 2 puffs into the lungs every 4 (four) hours as needed for wheezing or shortness of breath. [name], DO  Active   allopurinol (ZYLOPRIM) 100 MG tablet [id number]  Take 2 tablets (200 mg total) by mouth daily. [name], DO  Active   aspirin 81 MG chewable tablet [id number]  Chew 81 mg by mouth daily. [provider]  Active Self  atorvastatin (LIPITOR) 20 MG tablet [id number]  Take 1 tablet (20 mg total) by mouth daily. [name], DO  Active   blood glucose meter kit and supplies KIT [id number]  Brand of choice; LON 99 months; check FSBS 3x a day; E11.65; disp one meter with 100 strips + 1 refill of strips [name], [name], MD  Active Self  Blood Glucose Monitoring Suppl (ONE TOUCH ULTRA 2) w/Device KIT [id number]  Use to check blood glucose up to 3 x daily [name], DO  Active   bumetanide (BUMEX) 1 MG tablet [id number] Yes Take by mouth. Take 2 tablets (2 mg total) by mouth in  the morning and 2 tablets (2 mg total) at noon and 2 tablets (2 mg total) in the evening. [provider] Taking Active   calcitRIOL (ROCALTROL) 0.5 MCG capsule [id number]  Take by mouth. Take 2 capsules (1 mcg total) by mouth once daily [provider]  Active   carvedilol (COREG) 25 MG tablet [id number] Yes Take 2 tablets (50 mg total) by mouth 2 (two) times daily with a meal. [name], [name], DO Taking Active   cyclobenzaprine (FLEXERIL) 10 MG tablet [id number]  Take 1 tablet (10 mg total) by mouth 3 (three) times daily as needed for muscle spasms. [name], [name], DO  Active   Dulaglutide (TRULICITY) 6.22 QJ/3.3LK Bonney Aid [id number] Yes Inject 0.75 mg into the skin once a week. [provider] Taking Active            Med Note [name], Noell Lorensen A   Wed Jul 20, 2018  5:42 PM) Taking on Fridays  fluticasone (FLONASE) 50 MCG/ACT nasal spray [id number]  Place 2 sprays into both nostrils daily. Use for 4-6 weeks then stop and use seasonally or as needed. [name], [name], DO  Active   hydrOXYzine (VISTARIL) 25 MG capsule [id number]  Take 1 capsule (25 mg total) by mouth every 8 (eight) hours as needed for itching. [name], DO  Active   Insulin Glargine [facility] KWIKPEN) 100 UNIT/ML SOPN [id number] Yes Inject 10 Units into the skin daily. Submitted to [employer]  for assistance [name], DO Taking Active Self           Med Note [name], Wayman Hoard A   Mon Jan 15, 2020 10:34 AM)    Insulin Pen Needle (B-D UF III MINI PEN NEEDLES) 31G X 5 MM MISC [id number]  1 each by Does not apply route daily. Use once daily with Victoza. [name], DO  Active Self  ipratropium (ATROVENT) 0.06 % nasal spray [id number]  Place 2 sprays into both nostrils 4 (four) times daily. As needed for 1-2 weeks. [name], [name], DO  Active   lisinopril (ZESTRIL) 2.5 MG tablet [id number] No Take 1 tablet (2.5 mg total) by mouth daily.   Patient not taking: Reported on 12/30/2020   [name], DO Not Taking Active   loratadine (CLARITIN) 10 MG tablet [id number]  Take 1 tablet (10 mg total) by mouth daily.  Patient taking differently: Take 10 mg by mouth every other day.   [name], DO  Active   magnesium oxide (MAG-OX) 400 (241.3 Mg) MG tablet [id number]  Take 1 tablet by mouth daily. [provider]  Active   metolazone (ZAROXOLYN) 5 MG tablet [id number] No Take 5 mg by mouth daily.  Patient not taking: Reported on 12/30/2020   [provider] Not Taking Active   montelukast (SINGULAIR) 10 MG tablet [id number] No Take 1 tablet (10 mg total) by mouth at bedtime.  Patient not taking: Reported on 12/30/2020   [name], DO Not Taking Active   OneTouch Delica Lancets [address] [place] [id number]  Use to check blood sugar 3 x daily [name], DO  Active   [facility] ULTRA test strip [id number]  Check blood sugar 3 x daily [name], DO  Active   simethicone (MYLICON) 121 MG chewable tablet [id number]  Chew 125 mg by mouth every 6 (six) hours as needed for flatulence. [provider]  Active   traMADol (ULTRAM) 50 MG tablet [id number]  Take 1-2 tablets (50-100 mg total) by mouth every 12 (twelve) hours as needed. [name], DO  Active   vitamin C (ASCORBIC ACID) 500 MG tablet [id number]  Take 1,000 mg by mouth daily. [provider]  Active Self           Med Note [name], [name]   Tue Apr 09, 2015  8:28 AM)              Pertinent Labs:  Lab Results  Component Value Date   HGBA1C 7.0 (A) 07/19/2020   Lab Results  Component Value Date   CHOL 121 01/04/2020   HDL 61 01/04/2020   LDLCALC 34 01/04/2020   TRIG 187 (H) 01/04/2020   CHOLHDL 2.0 01/04/2020   Lab Results  Component Value Date   CREATININE 1.83 (H) 10/05/2018   BUN 26 (H) 10/05/2018   NA 137 10/05/2018   K 4.2 10/05/2018   CL 99 10/05/2018   CO2 32  10/05/2018  Per shared record from [facility], Creatinine: 4.99; BUN: 82 on 11/13/2020  SDOH:  (Social Determinants of Health) assessments and interventions performed:    [place]  Review of patient past medical history, allergies, medications, health status, including review of consultants reports, laboratory and other test data, was performed as part of comprehensive evaluation and provision of chronic care management services.   Care Plan : PharmD - Med Assistance  Updates made by Rennis Petty, RPH-CPP since 12/30/2020 12:00 AM  Problem: Disease Progression      Long-Range Goal: Disease Progression Prevented or Minimized   Start Date: 04/03/2020  Expected End Date: 07/02/2020  This Visit's Progress: On track  Recent Progress: On track  Priority: High  Note:   Current Barriers:  Financial Barriers  Patient APPROVED to receive Trulicity and Engineer, agricultural through [employer] patient assistance program through 01/25/2021 Lack of blood sugar or blood pressure results for the clinical team   Pharmacist Clinical Goal(s):  Over the next 90 days, patient will achieve adherence to monitoring guidelines and medication adherence to achieve therapeutic efficacy through collaboration with PharmD and provider.    Interventions: 1:1 collaboration with [name], DO regarding development and update of comprehensive plan of care as evidenced by provider attestation and co-signature Inter-disciplinary care team collaboration (see longitudinal plan of care) Perform chart review Office Visit with [facility] on 11/28 Office Visit with [place] ([place] on 10/19 Creatinine: 4.99 mg/dL; eGFR: 13 on 10/19 Per telephone note from [place] on 10/24, patient called regarding labs and to return for repeat blood work after stopping metolazone for a few days Today patient confirms has held lisinopril and metolazone doses as directed by providers, but denies  having returned to [place] for repeat blood work. Patient does not recall receiving message about returning for lab work. However, patient has upcoming appointment with [place] on 12/15.  Contacts [place] office today and lets CM Pharmacist know that he is scheduled to have labs drawn this week, prior to upcoming appointment on 12/15 Patient denies monitoring home blood pressure Note clinical team has repeatedly encouraged patient to check BP as recommended by providers and upper arm BP monitor provided. However, patient has declined to monitor BP at home. Again encourage/discuss rational for patient monitoring home BP/HR and keep log of results Unable to complete medication review today as patient is not home Discuss medication renal dose adjustment, including: Counsel patient on taking loratadine 10 mg no more than every other day for his allergy symptoms Advise patient to discuss allopurinol dose with Nephrologist at upcoming appointment  Type 2 DM Current treatment: Trulicity 1.61 mg weekly Basaglar 10 units daily Reports recent home blood sugar readings ranging: 90s-110s Denies recent hypoglycemia Have counseled on s/s of low blood sugars, how to manage lows and to contact office if is having low readings Have discussed importance of eating regular well-balanced meals throughout the day, avoiding skipping meals  Medication Assistance: Reports has sufficient supply of Basaglar and Trulicity to last through end of current calendar year Will follow up with patient regarding re-enrollment in [place] Patient Assistance Program (PAP) for 2023 calendar year during our next telephone appointment  Patient Goals/Self-Care Activities Over the next 90 days, patient will:  - check glucose, document, and provide at future appointments - collaborate with provider on medication access solutions - attend medical appointments as scheduled:  Next appointment with Nephrology on 12/15   Follow Up Plan:    1) CM  Pharmacist will outreach to patient by telephone on 01/08/2021 at 8:30 AM      Wallace Cullens, PharmD, Para [place], [place] [phone]

## 2020-12-30 NOTE — Patient Instructions (Addendum)
Visit Information  Thank you for taking time to visit with me today. Please don't hesitate to contact me if I can be of assistance to you before our next scheduled telephone appointment.  Following are the goals we discussed today:  Our goal A1c is less than 7%. This corresponds with fasting sugars less than 130 and 2 hour after meal sugars less than 180. Please check your blood sugar and keep a log of the results  Our goal bad cholesterol, or LDL, is less than 70 . This is why it is important to continue taking your atorvastatin.  Please check your daily weights, as well as follow fluid and salt instructions, as directed by your kidney doctor. Please keep a record of daily weights and bring this with you to your medical appointments  Please check your home blood pressure, keep a log of the results and bring this with you to your medical appointments.  Feel free to call me with any questions or concerns. I look forward to our next call!  Our next appointment is by telephone on 01/08/2021 at 8:30 AM  Please call the care guide team at [phone] if you need to cancel or reschedule your appointment.    The patient verbalized understanding of instructions, educational materials, and care plan provided today and declined offer to receive copy of patient instructions, educational materials, and care plan.   Wallace Cullens, PharmD, Para [place], CPP Clinical Pharmacist [facility] [phone]

## 2021-01-02 DIAGNOSIS — I509 Heart failure, unspecified: Secondary | ICD-10-CM | POA: Diagnosis not present

## 2021-01-02 DIAGNOSIS — E1122 Type 2 diabetes mellitus with diabetic chronic kidney disease: Secondary | ICD-10-CM | POA: Diagnosis not present

## 2021-01-02 DIAGNOSIS — E875 Hyperkalemia: Secondary | ICD-10-CM | POA: Diagnosis not present

## 2021-01-02 DIAGNOSIS — N184 Chronic kidney disease, stage 4 (severe): Secondary | ICD-10-CM | POA: Diagnosis not present

## 2021-01-02 DIAGNOSIS — I129 Hypertensive chronic kidney disease with stage 1 through stage 4 chronic kidney disease, or unspecified chronic kidney disease: Secondary | ICD-10-CM | POA: Diagnosis not present

## 2021-01-08 ENCOUNTER — Ambulatory Visit: Payer: PPO | Admitting: Pharmacist

## 2021-01-08 ENCOUNTER — Telehealth: Payer: Self-pay | Admitting: Pharmacy Technician

## 2021-01-08 DIAGNOSIS — N184 Chronic kidney disease, stage 4 (severe): Secondary | ICD-10-CM

## 2021-01-08 DIAGNOSIS — Z596 Low income: Secondary | ICD-10-CM

## 2021-01-08 DIAGNOSIS — I129 Hypertensive chronic kidney disease with stage 1 through stage 4 chronic kidney disease, or unspecified chronic kidney disease: Secondary | ICD-10-CM

## 2021-01-08 DIAGNOSIS — E1122 Type 2 diabetes mellitus with diabetic chronic kidney disease: Secondary | ICD-10-CM

## 2021-01-08 NOTE — Progress Notes (Signed)
[facility] [place])                                            [place] Team    01/08/2021  NAGI FURIO 1965-10-19 [id number]                                      Medication Assistance Referral  Referral From: [place]: Nancee Liter / Lilly Patient application portion:  Mailed Provider application portion: Faxed  to Dr. Parks Ranger Provider address/fax verified via: Office website  Medication/Company: Danelle Berry / [name] Patient application portion:  Mailed Provider application portion: Faxed  to Dr. Parks Ranger Provider address/fax verified via: Office website    Lolamae Voisin P. Miche Loughridge, [place]  [phone]

## 2021-01-08 NOTE — Chronic Care Management (AMB) (Signed)
Chronic Care Management CCM Pharmacy Note  01/08/2021 Name:  KWEKU STANKEY MRN:  [id number] DOB:  [birth year]   Subjective: [name] is an 55 y.o. year old male who is a primary patient of [name], DO.  The CCM team was consulted for assistance with disease management and care coordination needs.    Engaged with patient by telephone for follow up visit for pharmacy case management and/or care coordination services.   Objective:  Medications Reviewed Today     Reviewed by Rennis Petty, RPH-CPP (Pharmacist) on 12/30/20 at 0955  Med List Status: <None>   Medication Order Taking? Sig Documenting Provider Last Dose Status Informant  albuterol (PROVENTIL) (2.5 MG/3ML) 0.083% nebulizer solution [id number]  Take 3 mLs (2.5 mg total) by nebulization every 6 (six) hours as needed for wheezing or shortness of breath. [name], [name], DO  Active   albuterol (VENTOLIN HFA) 108 (90 Base) MCG/ACT inhaler [id number]  Inhale 2 puffs into the lungs every 4 (four) hours as needed for wheezing or shortness of breath. [name], DO  Active   allopurinol (ZYLOPRIM) 100 MG tablet [id number]  Take 2 tablets (200 mg total) by mouth daily. [name], DO  Active   aspirin 81 MG chewable tablet [id number]  Chew 81 mg by mouth daily. [provider]  Active Self  atorvastatin (LIPITOR) 20 MG tablet [id number]  Take 1 tablet (20 mg total) by mouth daily. [name], DO  Active   blood glucose meter kit and supplies KIT [id number]  Brand of choice; LON 99 months; check FSBS 3x a day; E11.65; disp one meter with 100 strips + 1 refill of strips [name], [name], MD  Active Self  Blood Glucose Monitoring Suppl (ONE TOUCH ULTRA 2) w/Device KIT [id number]  Use to check blood glucose up to 3 x daily [name], DO  Active   bumetanide (BUMEX) 1 MG tablet [id number] Yes Take by mouth. Take 2 tablets (2 mg total) by mouth in  the morning and 2 tablets (2 mg total) at noon and 2 tablets (2 mg total) in the evening. [provider] Taking Active   calcitRIOL (ROCALTROL) 0.5 MCG capsule [id number]  Take by mouth. Take 2 capsules (1 mcg total) by mouth once daily [provider]  Active   carvedilol (COREG) 25 MG tablet [id number] Yes Take 2 tablets (50 mg total) by mouth 2 (two) times daily with a meal. [name], [name], DO Taking Active   cyclobenzaprine (FLEXERIL) 10 MG tablet [id number]  Take 1 tablet (10 mg total) by mouth 3 (three) times daily as needed for muscle spasms. [name], [name], DO  Active   Dulaglutide (TRULICITY) 4.19 FX/9.0WI [facility] [facility] [id number] Yes Inject 0.75 mg into the skin once a week. [provider] Taking Active            Med Note [name], Jossalin Chervenak A   Wed Jul 20, 2018  5:42 PM) Taking on Fridays  fluticasone (FLONASE) 50 MCG/ACT nasal spray [id number]  Place 2 sprays into both nostrils daily. Use for 4-6 weeks then stop and use seasonally or as needed. [name], [name], DO  Active   hydrOXYzine (VISTARIL) 25 MG capsule [id number]  Take 1 capsule (25 mg total) by mouth every 8 (eight) hours as needed for itching. [name], DO  Active   Insulin Glargine [facility] KWIKPEN) 100 UNIT/ML SOPN [id number] Yes Inject 10 Units into the skin daily. Submitted to [employer]  for assistance [name], DO Taking Active Self           Med Note [name], Payal Stanforth A   Mon Jan 15, 2020 10:34 AM)    Insulin Pen Needle (B-D UF III MINI PEN NEEDLES) 31G X 5 MM MISC [id number]  1 each by Does not apply route daily. Use once daily with Victoza. [name], DO  Active Self  ipratropium (ATROVENT) 0.06 % nasal spray [id number]  Place 2 sprays into both nostrils 4 (four) times daily. As needed for 1-2 weeks. [name], [name], DO  Active   lisinopril (ZESTRIL) 2.5 MG tablet [id number] No Take 1 tablet (2.5 mg total) by mouth daily.   Patient not taking: Reported on 12/30/2020   [name], DO Not Taking Active   loratadine (CLARITIN) 10 MG tablet [id number]  Take 1 tablet (10 mg total) by mouth daily.  Patient taking differently: Take 10 mg by mouth every other day.   [name], DO  Active   magnesium oxide (MAG-OX) 400 (241.3 Mg) MG tablet [id number]  Take 1 tablet by mouth daily. [provider]  Active   metolazone (ZAROXOLYN) 5 MG tablet [id number] No Take 5 mg by mouth daily.  Patient not taking: Reported on 12/30/2020   [provider] Not Taking Active   montelukast (SINGULAIR) 10 MG tablet [id number] No Take 1 tablet (10 mg total) by mouth at bedtime.  Patient not taking: Reported on 12/30/2020   [name], DO Not Taking Active   OneTouch Delica Lancets [address] [place] [id number]  Use to check blood sugar 3 x daily [name], DO  Active   [facility] ULTRA test strip [id number]  Check blood sugar 3 x daily [name], DO  Active   simethicone (MYLICON) 732 MG chewable tablet [id number]  Chew 125 mg by mouth every 6 (six) hours as needed for flatulence. [provider]  Active   traMADol (ULTRAM) 50 MG tablet [id number]  Take 1-2 tablets (50-100 mg total) by mouth every 12 (twelve) hours as needed. [name], DO  Active   vitamin C (ASCORBIC ACID) 500 MG tablet [id number]  Take 1,000 mg by mouth daily. [provider]  Active Self           Med Note [name], [name]   Tue Apr 09, 2015  8:28 AM)              Pertinent Labs:  Lab Results  Component Value Date   HGBA1C 7.0 (A) 07/19/2020   Lab Results  Component Value Date   CHOL 121 01/04/2020   HDL 61 01/04/2020   LDLCALC 34 01/04/2020   TRIG 187 (H) 01/04/2020   CHOLHDL 2.0 01/04/2020    SDOH:  (Social Determinants of Health) assessments and interventions performed:    [place]  Review of patient past medical history, allergies,  medications, health status, including review of consultants reports, laboratory and other test data, was performed as part of comprehensive evaluation and provision of chronic care management services.   Care Plan : PharmD - Med Assistance  Updates made by Rennis Petty, RPH-CPP since 01/08/2021 12:00 AM     Problem: Disease Progression      Long-Range Goal: Disease Progression Prevented or Minimized   Start Date: 04/03/2020  Expected End Date: 07/02/2020  Recent Progress: On track  Priority: High  Note:   Current Barriers:  Financial Barriers  Patient APPROVED to receive Trulicity and  Basaglar through [employer] patient assistance program through 01/25/2021 Lack of blood sugar or blood pressure results for the clinical team   Pharmacist Clinical Goal(s):  Over the next 90 days, patient will achieve adherence to monitoring guidelines and medication adherence to achieve therapeutic efficacy through collaboration with PharmD and provider.    Interventions: 1:1 collaboration with [name], DO regarding development and update of comprehensive plan of care as evidenced by provider attestation and co-signature Inter-disciplinary care team collaboration (see longitudinal plan of care) Perform chart review Office Visit with [facility] on 11/28 Office Visit with [place] ([place]) on 10/19 Creatinine: 4.99 mg/dL; eGFR: 13 on 10/19 Per telephone note from [place] on 10/24, patient called regarding labs and to return for repeat blood work after stopping metolazone for a few days Patient returned to [place] on 12/8 for lab work Creatinine: 3.58 mg/dL; eGFR 19 Confirms continuing to hold lisinopril and metolazone doses as directed by providers and plans to attend appointment with [place] on 12/15.  Patient denies monitoring home blood pressure Note CM Pharmacist hs repeatedly encouraged patient to check BP as recommended by providers and upper arm BP monitor  provided. However, patient has declined to monitor BP at home. Again encourage/discuss rational for patient monitoring home BP/HR and keep log of results Unable to complete medication review today as patient is not home Discussed medication renal dose adjustment, including: Have counseled patient on taking loratadine 10 mg no more than every other day for his allergy symptoms Advise patient to discuss allopurinol dose with Nephrologist at upcoming appointment  Type 2 DM Current treatment: Trulicity 8.24 mg weekly Basaglar 10 units daily Reports recent home blood sugar readings ranging: 90s-110s Denies recent hypoglycemia Have counseled on s/s of low blood sugars, how to manage lows and to contact office if is having low readings Have discussed importance of eating regular well-balanced meals throughout the day, avoiding skipping meals  Medication Assistance: Will collaborate with [place] Simcox for aid to patient with re-enrollment in [place] Patient Assistance Program (PAP) for [place] and Trulicity for 2353 calendar year   Patient Goals/Self-Care Activities Over the next 90 days, patient will:  - check glucose, document, and provide at future appointments - collaborate with provider on medication access solutions - attend medical appointments as scheduled:  Next appointment with Nephrology on 12/15   Follow Up Plan:    1) CM Pharmacist will outreach to patient by telephone on 02/10/2021 at 8:30 AM      Wallace Cullens, PharmD, [place], [place] [phone]

## 2021-01-08 NOTE — Patient Instructions (Signed)
Visit Information  Thank you for taking time to visit with me today. Please don't hesitate to contact me if I can be of assistance to you before our next scheduled telephone appointment.  Following are the goals we discussed today:   Goals Addressed             This Visit's Progress    Pharmacy Goals   Not on track    Our goal A1c is less than 7%. This corresponds with fasting sugars less than 130 and 2 hour after meal sugars less than 180. Please check your blood sugar and keep a log of the results  Our goal bad cholesterol, or LDL, is less than 70 . This is why it is important to continue taking your atorvastatin.  Please check your daily weights, as well as follow fluid and salt instructions, as directed by your kidney doctor. Please keep a record of daily weights and bring this with you to your medical appointments  Please check your home blood pressure, keep a log of the results and bring this with you to your medical appointments.  Feel free to call me with any questions or concerns. I look forward to our next call!  Wallace Cullens, PharmD, [place] [phone]         Our next appointment is by telephone on 02/10/2021 at 8:30 AM  Please call the care guide team at [phone] if you need to cancel or reschedule your appointment.    The patient verbalized understanding of instructions, educational materials, and care plan provided today and declined offer to receive copy of patient instructions, educational materials, and care plan.

## 2021-01-09 DIAGNOSIS — N2581 Secondary hyperparathyroidism of renal origin: Secondary | ICD-10-CM | POA: Diagnosis not present

## 2021-01-09 DIAGNOSIS — N184 Chronic kidney disease, stage 4 (severe): Secondary | ICD-10-CM | POA: Diagnosis not present

## 2021-01-09 DIAGNOSIS — I129 Hypertensive chronic kidney disease with stage 1 through stage 4 chronic kidney disease, or unspecified chronic kidney disease: Secondary | ICD-10-CM | POA: Diagnosis not present

## 2021-01-09 DIAGNOSIS — E1122 Type 2 diabetes mellitus with diabetic chronic kidney disease: Secondary | ICD-10-CM | POA: Diagnosis not present

## 2021-01-09 DIAGNOSIS — I509 Heart failure, unspecified: Secondary | ICD-10-CM | POA: Diagnosis not present

## 2021-01-09 DIAGNOSIS — R809 Proteinuria, unspecified: Secondary | ICD-10-CM | POA: Diagnosis not present

## 2021-01-09 DIAGNOSIS — E875 Hyperkalemia: Secondary | ICD-10-CM | POA: Diagnosis not present

## 2021-01-15 ENCOUNTER — Other Ambulatory Visit: Payer: Self-pay | Admitting: Family Medicine

## 2021-01-15 DIAGNOSIS — K219 Gastro-esophageal reflux disease without esophagitis: Secondary | ICD-10-CM

## 2021-01-15 NOTE — Telephone Encounter (Signed)
D/C 07/19/20.

## 2021-01-16 ENCOUNTER — Other Ambulatory Visit: Payer: Self-pay

## 2021-01-16 DIAGNOSIS — K219 Gastro-esophageal reflux disease without esophagitis: Secondary | ICD-10-CM

## 2021-01-16 MED ORDER — ESOMEPRAZOLE MAGNESIUM 40 MG PO CPDR: 40.0000 mg | DELAYED_RELEASE_CAPSULE | Freq: Two times a day (BID) | ORAL | 1 refills | Status: DC

## 2021-01-25 DIAGNOSIS — Z794 Long term (current) use of insulin: Secondary | ICD-10-CM

## 2021-01-25 DIAGNOSIS — E1122 Type 2 diabetes mellitus with diabetic chronic kidney disease: Secondary | ICD-10-CM

## 2021-01-25 DIAGNOSIS — N184 Chronic kidney disease, stage 4 (severe): Secondary | ICD-10-CM

## 2021-01-29 ENCOUNTER — Telehealth: Payer: Self-pay | Admitting: Pharmacy Technician

## 2021-01-29 DIAGNOSIS — Z596 Low income: Secondary | ICD-10-CM

## 2021-01-29 NOTE — Progress Notes (Signed)
[facility])                                            [place] Team    01/29/2021  [name] 1965/06/07 [id number]  Received patient and provider portion(s) of patient assistance application(s) for Basaglar and Trulicity. Faxed completed application and required documents into [employer].   [name] P. [place], [place]  [phone]

## 2021-02-10 ENCOUNTER — Ambulatory Visit (INDEPENDENT_AMBULATORY_CARE_PROVIDER_SITE_OTHER): Payer: PPO | Admitting: Pharmacist

## 2021-02-10 DIAGNOSIS — E1122 Type 2 diabetes mellitus with diabetic chronic kidney disease: Secondary | ICD-10-CM

## 2021-02-10 DIAGNOSIS — N184 Chronic kidney disease, stage 4 (severe): Secondary | ICD-10-CM

## 2021-02-10 NOTE — Patient Instructions (Signed)
Visit Information  Thank you for taking time to visit with me today. Please don't hesitate to contact me if I can be of assistance to you before our next scheduled telephone appointment.  Following are the goals we discussed today:   Goals Addressed             This Visit's Progress    Pharmacy Goals       Our goal A1c is less than 7%. This corresponds with fasting sugars less than 130 and 2 hour after meal sugars less than 180. Please check your blood sugar and keep a log of the results  Our goal bad cholesterol, or LDL, is less than 70 . This is why it is important to continue taking your atorvastatin.  Please check your daily weights, as well as follow fluid and salt instructions, as directed by your kidney doctor. Please keep a record of daily weights and bring this with you to your medical appointments  Please check your home blood pressure, keep a log of the results and bring this with you to your medical appointments.  Feel free to call me with any questions or concerns. I look forward to our next call!   Wallace Cullens, PharmD, [place] [phone]         Our next appointment is by telephone next month  Please call the care guide team at [phone] if you need to cancel or reschedule your appointment.    The patient verbalized understanding of instructions, educational materials, and care plan provided today and declined offer to receive copy of patient instructions, educational materials, and care plan.

## 2021-02-10 NOTE — Chronic Care Management (AMB) (Signed)
Chronic Care Management CCM Pharmacy Note  02/10/2021 Name:  [name] MRN:  [id number] DOB:  [birth year]   Subjective: [name] is an 56 y.o. year old male who is a primary patient of [name], DO.  The CCM team was consulted for assistance with disease management and care coordination needs.    Engaged with patient by telephone for follow up visit for pharmacy case management and/or care coordination services.   Objective:  Medications Reviewed Today     Reviewed by Rennis Petty, RPH-CPP (Pharmacist) on 02/10/21 at 0850  Med List Status: <None>   Medication Order Taking? Sig Documenting Provider Last Dose Status Informant  albuterol (PROVENTIL) (2.5 MG/3ML) 0.083% nebulizer solution [id number] No Take 3 mLs (2.5 mg total) by nebulization every 6 (six) hours as needed for wheezing or shortness of breath.  Patient not taking: Reported on 02/10/2021   [name], DO Not Taking Active   albuterol (VENTOLIN HFA) 108 (90 Base) MCG/ACT inhaler [id number] Yes Inhale 2 puffs into the lungs every 4 (four) hours as needed for wheezing or shortness of breath. [name], DO Taking Active   allopurinol (ZYLOPRIM) 100 MG tablet [id number] Yes Take 2 tablets (200 mg total) by mouth daily. [name], DO Taking Active   aspirin 81 MG chewable tablet [id number] Yes Chew 81 mg by mouth daily. [provider] Taking Active Self  atorvastatin (LIPITOR) 20 MG tablet [id number] Yes Take 1 tablet (20 mg total) by mouth daily. [name], DO Taking Active   blood glucose meter kit and supplies KIT [id number]  Brand of choice; LON 99 months; check FSBS 3x a day; E11.65; disp one meter with 100 strips + 1 refill of strips [name], [name], MD  Active Self  Blood Glucose Monitoring Suppl (ONE TOUCH ULTRA 2) w/Device KIT [id number]  Use to check blood glucose up to 3 x daily [name], DO  Active    bumetanide (BUMEX) 1 MG tablet [id number] Yes Take by mouth. Take 2 tablets (2 mg total) by mouth in the morning and 2 tablets (2 mg total) at noon and 2 tablets (2 mg total) in the evening. [provider] Taking Active   calcitRIOL (ROCALTROL) 0.5 MCG capsule [id number] Yes Take by mouth. Take 2 capsules (1 mcg total) by mouth once daily [provider] Taking Active   carvedilol (COREG) 25 MG tablet [id number] Yes Take 2 tablets (50 mg total) by mouth 2 (two) times daily with a meal. [name], [name], DO Taking Active   cyclobenzaprine (FLEXERIL) 10 MG tablet [id number] No Take 1 tablet (10 mg total) by mouth 3 (three) times daily as needed for muscle spasms.  Patient not taking: Reported on 02/10/2021   [name], DO Not Taking Active   Dulaglutide (TRULICITY) 1.65 VV/7.4MO [facility] [id number] Yes Inject 0.75 mg into the skin once a week. [provider] Taking Active            Med Note [name], Charlies Rayburn A   Wed Jul 20, 2018  5:42 PM) Taking on Fridays  esomeprazole (NEXIUM) 40 MG capsule [id number] Yes Take 1 capsule (40 mg total) by mouth 2 (two) times daily before a meal. [place], [name], DO Taking Active   fluticasone (FLONASE) 50 MCG/ACT nasal spray [id number] Yes Place 2 sprays into both nostrils daily. Use for 4-6 weeks then stop and use seasonally or as needed. [name], DO Taking Active   Insulin  Glargine [facility] KWIKPEN) 100 UNIT/ML SOPN [id number] Yes Inject 10 Units into the skin daily. Submitted to [employer] for assistance [name], DO Taking Active Self           Med Note [name], Ancel Easler A   Mon Jan 15, 2020 10:34 AM)    Insulin Pen Needle (B-D UF III MINI PEN NEEDLES) 31G X 5 MM MISC [id number]  1 each by Does not apply route daily. Use once daily with Victoza. [name], DO  Active Self  loratadine (CLARITIN) 10 MG tablet [id number] Yes Take 1 tablet (10 mg total) by mouth daily.   Patient taking differently: Take 10 mg by mouth every other day. As needed   [name], DO Taking Active   metolazone (ZAROXOLYN) 5 MG tablet [id number] Yes Take by mouth. Take 1 tablet (5 mg total) by mouth 1 (one) time each day if needed (for weight gain) [provider] Taking Active   montelukast (SINGULAIR) 10 MG tablet [id number] No Take 1 tablet (10 mg total) by mouth at bedtime.  Patient not taking: Reported on 12/30/2020   [name], DO Not Taking Active   OneTouch Delica Lancets [address] [place] [id number]  Use to check blood sugar 3 x daily [name], DO  Active   [facility] ULTRA test strip [id number]  Check blood sugar 3 x daily [name], DO  Active   simethicone (MYLICON) 086 MG chewable tablet [id number] Yes Chew 125 mg by mouth every 6 (six) hours as needed for flatulence. [provider] Taking Active   traMADol (ULTRAM) 50 MG tablet [id number] No Take 1-2 tablets (50-100 mg total) by mouth every 12 (twelve) hours as needed.  Patient not taking: Reported on 02/10/2021   [name], DO Not Taking Active   vitamin C (ASCORBIC ACID) 500 MG tablet [id number] Yes Take 1,000 mg by mouth daily. [provider] Taking Active Self           Med Note [name], [name]   Tue Apr 09, 2015  8:28 AM)              Pertinent Labs:  Lab Results  Component Value Date   HGBA1C 7.0 (A) 07/19/2020   Lab Results  Component Value Date   CHOL 121 01/04/2020   HDL 61 01/04/2020   LDLCALC 34 01/04/2020   TRIG 187 (H) 01/04/2020   CHOLHDL 2.0 01/04/2020   Lab Results  Component Value Date   CREATININE 1.83 (H) 10/05/2018   BUN 26 (H) 10/05/2018   NA 137 10/05/2018   K 4.2 10/05/2018   CL 99 10/05/2018   CO2 32 10/05/2018    SDOH:  (Social Determinants of Health) assessments and interventions performed:    [place]  Review of patient past medical history, allergies, medications, health  status, including review of consultants reports, laboratory and other test data, was performed as part of comprehensive evaluation and provision of chronic care management services.   Care Plan : PharmD - Med Assistance  Updates made by Rennis Petty, RPH-CPP since 02/10/2021 12:00 AM     Problem: Disease Progression      Long-Range Goal: Disease Progression Prevented or Minimized   Start Date: 04/03/2020  Expected End Date: 07/02/2020  Recent Progress: On track  Priority: High  Note:   Current Barriers:  Financial Barriers  Patient APPROVED to receive Trulicity and Engineer, agricultural through [employer] patient assistance program through 01/25/2021 Lack of blood sugar  or blood pressure results for the clinical team   Pharmacist Clinical Goal(s):  Over the next 90 days, patient will achieve adherence to monitoring guidelines and medication adherence to achieve therapeutic efficacy through collaboration with PharmD and provider.    Interventions: 1:1 collaboration with [name], DO regarding development and update of comprehensive plan of care as evidenced by provider attestation and co-signature Inter-disciplinary care team collaboration (see longitudinal plan of care) Perform chart review Office Visit with [place] on 12/15 Comprehensive medication review performed; medication list updated in electronic medical record Counsel patient to take medications as directed  Type 2 DM Current treatment: Trulicity 4.31 mg weekly Basaglar 10 units daily Reports recent home blood sugar readings ranging: 100-115 Denies recent hypoglycemia Have counseled on s/s of low blood sugars, how to manage lows and to contact office if is having low readings Have discussed importance of eating regular well-balanced meals throughout the day, avoiding skipping meals  Medication Assistance: Collaborating with [place] Simcox for aid to patient with re-enrollment in [place]  Patient Assistance Program (PAP) for [employer] and Trulicity for 4276 calendar year  From review of chart, note [facility] CPhT Sharee [place] Simcox faxed patient assistance application for [employer] and Trulicity to [place] on 7/0/1100 Patient reports currently has > 1 month supply of both Basaglar and Trulicity remaining  Patient Goals/Self-Care Activities Over the next 90 days, patient will:  - check glucose, document, and provide at future appointments - collaborate with provider on medication access solutions - attend medical appointments as scheduled   Follow Up Plan:    1) CM Pharmacist will outreach to patient by telephone within the next month      Wallace Cullens, PharmD, [place], [facility] [place] [phone]

## 2021-02-18 ENCOUNTER — Ambulatory Visit: Payer: PPO | Admitting: Family Medicine

## 2021-02-18 ENCOUNTER — Ambulatory Visit: Payer: PPO

## 2021-02-25 DIAGNOSIS — Z794 Long term (current) use of insulin: Secondary | ICD-10-CM

## 2021-02-25 DIAGNOSIS — N184 Chronic kidney disease, stage 4 (severe): Secondary | ICD-10-CM

## 2021-02-25 DIAGNOSIS — E1122 Type 2 diabetes mellitus with diabetic chronic kidney disease: Secondary | ICD-10-CM

## 2021-02-26 ENCOUNTER — Telehealth: Payer: Self-pay | Admitting: Pharmacy Technician

## 2021-02-26 DIAGNOSIS — Z596 Low income: Secondary | ICD-10-CM

## 2021-02-26 NOTE — Progress Notes (Signed)
[facility])                                            [place] Team    02/26/2021  [place] Jan 23, 1966 [id number]  Care coordination call placed to [place] in regard to Trulicity and Cand[place]aria Arenas application.  Spoke to [place] who informs patient is APPROVED 02/21/21-01/25/22. She informs medication will ship based on last fill date in 2022 and going forward with delivery to the patient's home.  Niambi Smoak P. Amarachukwu [place], [place]  [phone]

## 2021-03-03 ENCOUNTER — Other Ambulatory Visit: Payer: Self-pay | Admitting: Family Medicine

## 2021-03-03 ENCOUNTER — Ambulatory Visit (INDEPENDENT_AMBULATORY_CARE_PROVIDER_SITE_OTHER): Payer: PPO | Admitting: Family Medicine

## 2021-03-03 ENCOUNTER — Other Ambulatory Visit: Payer: Self-pay

## 2021-03-03 ENCOUNTER — Encounter: Payer: Self-pay | Admitting: Family Medicine

## 2021-03-03 VITALS — BP 141/88 | HR 80 | Ht 71.0 in | Wt 342.0 lb

## 2021-03-03 DIAGNOSIS — Z794 Long term (current) use of insulin: Secondary | ICD-10-CM | POA: Diagnosis not present

## 2021-03-03 DIAGNOSIS — G894 Chronic pain syndrome: Secondary | ICD-10-CM | POA: Diagnosis not present

## 2021-03-03 DIAGNOSIS — I129 Hypertensive chronic kidney disease with stage 1 through stage 4 chronic kidney disease, or unspecified chronic kidney disease: Secondary | ICD-10-CM

## 2021-03-03 DIAGNOSIS — Z6841 Body Mass Index (BMI) 40.0 and over, adult: Secondary | ICD-10-CM | POA: Diagnosis not present

## 2021-03-03 DIAGNOSIS — M159 Polyosteoarthritis, unspecified: Secondary | ICD-10-CM

## 2021-03-03 DIAGNOSIS — E1122 Type 2 diabetes mellitus with diabetic chronic kidney disease: Secondary | ICD-10-CM

## 2021-03-03 DIAGNOSIS — Z1211 Encounter for screening for malignant neoplasm of colon: Secondary | ICD-10-CM

## 2021-03-03 DIAGNOSIS — E1169 Type 2 diabetes mellitus with other specified complication: Secondary | ICD-10-CM

## 2021-03-03 DIAGNOSIS — Z0000 Encounter for general adult medical examination without abnormal findings: Secondary | ICD-10-CM

## 2021-03-03 DIAGNOSIS — N184 Chronic kidney disease, stage 4 (severe): Secondary | ICD-10-CM | POA: Diagnosis not present

## 2021-03-03 DIAGNOSIS — M1A39X0 Chronic gout due to renal impairment, multiple sites, without tophus (tophi): Secondary | ICD-10-CM

## 2021-03-03 DIAGNOSIS — R351 Nocturia: Secondary | ICD-10-CM

## 2021-03-03 LAB — POCT GLYCOSYLATED HEMOGLOBIN (HGB A1C): Hemoglobin A1C: 5.2 % (ref 4.0–5.6)

## 2021-03-03 MED ORDER — TRAMADOL HCL 50 MG PO TABS: 50.0000 mg | ORAL_TABLET | Freq: Two times a day (BID) | ORAL | 2 refills | Status: DC | PRN

## 2021-03-03 NOTE — Progress Notes (Signed)
Subjective:    Patient ID: [name], male    DOB: 12/26/1965, 56 y.o.   MRN: [id number]  [name] is a 56 y.o. male presenting on 03/03/2021 for Diabetes   HPI  Type 2 Diabetes HTN CKD IV Morbid Obesity BMI >47 Chronic Cardiomyopathy dilated He is following with Dr [name] [name], he has lost 60+ lbs of water weight on diuretic, with improvement overall. Last A1c 7.0 (07/19/20) Due for reading today On Trulicity 0.75mg  weekly through PAP On [name] 10 u daily Followed by Cardiology, on diuretic therapy Has [facility] apt coming up  Chronic Joint Pain / Arthritis Gout He is on Tramadol PRn for pain cannot take NSAID No recent gout flare On Allopurinol  Erection Dysfunction Asking about low testosterone Has tried sildenafil cialis before without success  Depression screen [name] 2/9 03/03/2021 07/19/2020 01/04/2020  Decreased Interest 0 0 0  Down, Depressed, Hopeless 0 0 0  PHQ - 2 Score 0 0 0  Altered sleeping 0 0 0  Tired, decreased energy 0 1 0  Change in appetite 0 0 0  Feeling bad or failure about yourself  0 0 0  Trouble concentrating 0 0 0  Moving slowly or fidgety/restless 0 0 0  Suicidal thoughts 0 0 0  PHQ-9 Score 0 1 0  Difficult doing work/chores Not difficult at all Not difficult at all Not difficult at all  Some recent data might be hidden    Social History   Tobacco Use   Smoking status: Never   Smokeless tobacco: Never  Substance Use Topics   Alcohol use: Yes    Comment: a glass of wine and a couple beers a day   Drug use: No    Review of Systems Per HPI unless specifically indicated above     Objective:    BP (!) 141/88    Pulse 80    Ht 5\' 11"  (1.803 m)    Wt (!) 342 lb (155.1 kg)    SpO2 95%    BMI 47.70 kg/m   Wt Readings from Last 3 Encounters:  03/03/21 (!) 342 lb (155.1 kg)  10/30/20 (!) 335 lb 8 oz (152.2 kg)  07/19/20 (!) 396 lb (179.6 kg)    Physical Exam Vitals and nursing note reviewed.  Constitutional:       General: He is not in acute distress.    Appearance: He is well-developed. He is obese. He is not diaphoretic.     Comments: Well-appearing, comfortable, cooperative  HENT:     Head: Normocephalic and atraumatic.  Eyes:     General:        Right eye: No discharge.        Left eye: No discharge.     Conjunctiva/sclera: Conjunctivae normal.  Neck:     Thyroid: No thyromegaly.  Cardiovascular:     Rate and Rhythm: Normal rate and regular rhythm.     Pulses: Normal pulses.     Heart sounds: Normal heart sounds. No murmur heard. Pulmonary:     Effort: Pulmonary effort is normal. No respiratory distress.     Breath sounds: Normal breath sounds. No wheezing or rales.  Musculoskeletal:        General: Normal range of motion.     Cervical back: Normal range of motion and neck supple.  Lymphadenopathy:     Cervical: No cervical adenopathy.  Skin:    General: Skin is warm and dry.     Findings: No erythema  or rash.  Neurological:     Mental Status: He is alert and oriented to person, place, and time. Mental status is at baseline.  Psychiatric:        Behavior: Behavior normal.     Comments: Well groomed, good eye contact, normal speech and thoughts   Results for orders placed or performed in visit on 03/03/21  POCT HgB A1C  Result Value Ref Range   Hemoglobin A1C 5.2 4.0 - 5.6 %      Assessment & Plan:   Problem List Items Addressed This Visit     Type 2 diabetes mellitus, controlled, with renal complications ([name]) - Primary    Last A1c controlled On GLP1 Complications - CKD-IV, DM retinopathy Also complicated by hypoglycemia episodes Failed Bydureon (unable to operate pen)  Plan:  1. Continue Trulicity 0.75mg  future goal to inc dose GLP1 and lower insulin, on [name] 10 u  2. Encourage improved lifestyle - low carb, low sugar diet, reduce portion size, continue improving regular exercise 3. Check CBG, bring log to next visit for review 4. Continue ASA, ACEi, Statin       Relevant Orders   POCT HgB A1C (Completed)   Primary osteoarthritis involving multiple joints    Stable chronic OA/DJD joint pain, worse with morbid obesity Contraindicated NSAIDs to CKD-IV PDMP reviewed  Plan: Continues on Tramadol PRN      Relevant Medications   traMADol (ULTRAM) 50 MG tablet   Morbid obesity with BMI of 45.0-49.9, adult (HCC)   CKD (chronic kidney disease), stage IV (HCC)    Stable CKD-IV Followed by [name] Dr [name] Remains on allopurinol lower dosage for gout prophylaxis On low dose ACEi On diuretics per [name] CHF Continue improved hydration      Chronic pain syndrome   Relevant Medications   traMADol (ULTRAM) 50 MG tablet   Benign hypertension with CKD (chronic kidney disease) stage IV (HCC)    Chronic HTN, controlled Secondary complication CKD-IV Followed by [name] Dr [name], [facility] On ACEi low dose BB diuretics      Other Visit Diagnoses     Screening for colon cancer       Relevant Orders   Cologuard       Orders Placed This Encounter  Procedures   Cologuard   POCT HgB A1C     Meds ordered this encounter  Medications   traMADol (ULTRAM) 50 MG tablet    Sig: Take 1-2 tablets (50-100 mg total) by mouth every 12 (twelve) hours as needed.    Dispense:  45 tablet    Refill:  2    Add refills      Follow up plan: Return in about 3 months (around 05/31/2021) for 3 month fasting lab only then 1 week later Annual Physical.  Future labs ordered for 05/2021   [name], [facility] 03/03/2021, 8:13 AM

## 2021-03-03 NOTE — Assessment & Plan Note (Signed)
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 Continue Trulicity 0.75mg  future goal to inc dose GLP1 and lower insulin, on Basaglar 10 u  2. Encourage improved lifestyle - low carb, low sugar diet, reduce portion size, continue improving regular exercise 3. Check CBG, bring log to next visit for review 4. Continue ASA, ACEi, Statin

## 2021-03-03 NOTE — Assessment & Plan Note (Signed)
Stable chronic OA/DJD joint pain, worse with morbid obesity Contraindicated NSAIDs to CKD-IV PDMP reviewed  Plan: Continues on Tramadol PRN

## 2021-03-03 NOTE — Assessment & Plan Note (Signed)
Chronic HTN, controlled Secondary complication CKD-IV Followed by [facility] Dr Juleen China, Cardilogy On ACEi low dose BB diuretics

## 2021-03-03 NOTE — Patient Instructions (Addendum)
Thank you for coming to the office today.  Recent Labs    07/19/20 1055 03/03/21 0816  HGBA1C 7.0* 5.2    Refilled Tramadol today  Reminder to schedule to your eye apt at [address] send [place] the info on the compression stockings so we can order.  Ordered the Cologuard (home kit) test for colon cancer screening. Stay tuned for further updates.  It will be shipped to you directly. If not received in 2-4 weeks, call us or the company.   If you send it back and no results are received in 2-4 weeks, call us or the company as well!   Colon Cancer Screening: - For all adults age 73+ routine colon cancer screening is highly recommended.     - Recent guidelines from [place] recommend starting age of 48 - Early detection of colon cancer is important, because often there are no warning signs or symptoms, also if found early usually it can be cured. Late stage is hard to treat.   - If Cologuard is NEGATIVE, then it is good for 3 years before next due - If Cologuard is POSITIVE, then it is strongly advised to get a Colonoscopy, which allows the GI doctor to locate the source of the cancer or polyp (even very early stage) and treat it by removing it. ------------------------- Follow instructions to collect sample, you may call the company for any help or questions, 24/7 telephone support at [phone].   Please schedule a Follow-up Appointment to: Return in about 3 months (around 05/31/2021) for 3 month fasting lab only then 1 week later Annual Physical.  If you have any other questions or concerns, please feel free to call the office or send a message through [place]. You may also schedule an earlier appointment if necessary.  Additionally, you may be receiving a survey about your experience at our office within a few days to 1 week by e-mail or mail. We value your feedback.  [name], DO [facility]

## 2021-03-03 NOTE — Assessment & Plan Note (Signed)
Stable CKD-IV Followed by [facility] Dr Juleen China Remains on allopurinol lower dosage for gout prophylaxis On low dose ACEi On diuretics per [facility] CHF Continue improved hydration

## 2021-03-12 ENCOUNTER — Ambulatory Visit (INDEPENDENT_AMBULATORY_CARE_PROVIDER_SITE_OTHER): Payer: PPO | Admitting: Pharmacist

## 2021-03-12 DIAGNOSIS — E1122 Type 2 diabetes mellitus with diabetic chronic kidney disease: Secondary | ICD-10-CM

## 2021-03-12 NOTE — Patient Instructions (Signed)
Visit Information  Thank you for taking time to visit with me today. Please don't hesitate to contact me if I can be of assistance to you before our next scheduled telephone appointment.  Following are the goals we discussed today:   Goals Addressed             This Visit's Progress    Pharmacy Goals       Our goal A1c is less than 7%. This corresponds with fasting sugars less than 130 and 2 hour after meal sugars less than 180. Please check your blood sugar and keep a log of the results  Our goal bad cholesterol, or LDL, is less than 70 . This is why it is important to continue taking your atorvastatin.  Please check your daily weights, as well as follow fluid and salt instructions, as directed by your kidney doctor. Please keep a record of daily weights and bring this with you to your medical appointments  Please check your home blood pressure, keep a log of the results and bring this with you to your medical appointments.  Feel free to call me with any questions or concerns. I look forward to our next call!    Wallace Cullens, PharmD, [facility] [phone]         Our next appointment is by telephone on 07/07/2021 at 8:30 am  Please call the care guide team at [phone] if you need to cancel or reschedule your appointment.    Patient verbalizes understanding of instructions and care plan provided today and agrees to view in [place]. Active MyChart status confirmed with patient.

## 2021-03-12 NOTE — Chronic Care Management (AMB) (Signed)
Chronic Care Management CCM Pharmacy Note  03/12/2021 Name:  [name] MRN:  [id number] DOB:  [birth year]   Subjective: [name] is an 56 y.o. year old male who is a primary patient of [name], DO.  The CCM team was consulted for assistance with disease management and care coordination needs.    Engaged with patient by telephone for follow up visit for pharmacy case management and/or care coordination services.   Objective:  Medications    Reviewed by [name], DO (Physician) on 03/03/21 at [place] List Status: <None>   Medication Order Taking? Sig Documenting Provider Last Dose Status Informant  albuterol (PROVENTIL) (2.5 MG/3ML) 0.083% nebulizer solution [id number] No Take 3 mLs (2.5 mg total) by nebulization every 6 (six) hours as needed for wheezing or shortness of breath.  Patient not taking: Reported on 02/10/2021   [name], DO Not Taking Active   albuterol (VENTOLIN HFA) 108 (90 Base) MCG/ACT inhaler [id number] Yes Inhale 2 puffs into the lungs every 4 (four) hours as needed for wheezing or shortness of breath. [name], DO Taking Active   allopurinol (ZYLOPRIM) 100 MG tablet [id number] Yes Take 2 tablets (200 mg total) by mouth daily. [name], DO Taking Active   aspirin 81 MG chewable tablet [id number] Yes Chew 81 mg by mouth daily. [provider] Taking Active Self  atorvastatin (LIPITOR) 20 MG tablet [id number] Yes Take 1 tablet (20 mg total) by mouth daily. [name], DO Taking Active   blood glucose meter kit and supplies KIT [id number] Yes Brand of choice; LON 99 months; check FSBS 3x a day; E11.65; disp one meter with 100 strips + 1 refill of strips [name], [name], MD Taking Active Self  Blood Glucose Monitoring Suppl (ONE TOUCH ULTRA 2) w/Device KIT [id number] Yes Use to check blood glucose up to 3 x daily [name], DO Taking Active    bumetanide (BUMEX) 1 MG tablet [id number]  Take by mouth. Take 2 tablets (2 mg total) by mouth in the morning and 2 tablets (2 mg total) at noon and 2 tablets (2 mg total) in the evening. [provider]  Expired 02/26/21 2359   carvedilol (COREG) 25 MG tablet [id number] Yes Take 2 tablets (50 mg total) by mouth 2 (two) times daily with a meal. Parks Ranger, [name], DO Taking Active   cyclobenzaprine (FLEXERIL) 10 MG tablet [id number] No Take 1 tablet (10 mg total) by mouth 3 (three) times daily as needed for muscle spasms.  Patient not taking: Reported on 02/10/2021   [name], DO Not Taking Active   Dulaglutide (TRULICITY) 7.61 YJ/0.9KH [facility] [id number] Yes Inject 0.75 mg into the skin once a week. [provider] Taking Active            Med Note [name], Calla Wedekind A   Wed Jul 20, 2018  5:42 PM) Taking on Fridays  esomeprazole (NEXIUM) 40 MG capsule [id number] Yes Take 1 capsule (40 mg total) by mouth 2 (two) times daily before a meal. Parks Ranger, [name], DO Taking Active   fluticasone (FLONASE) 50 MCG/ACT nasal spray [id number] Yes Place 2 sprays into both nostrils daily. Use for 4-6 weeks then stop and use seasonally or as needed. [name], DO Taking Active   Insulin Glargine [facility]) 100 UNIT/ML SOPN [id number] Yes Inject 10 Units into the skin daily. Submitted to [employer] for assistance [name], DO Taking Active Self  Med Note [facility], [place] Pacella A   Mon Jan 15, 2020 10:34 AM)    Insulin Pen Needle (B-D UF III MINI PEN NEEDLES) 31G X 5 MM MISC [id number] Yes 1 each by Does not apply route daily. Use once daily with Victoza. [name], DO Taking Active Self  loratadine (CLARITIN) 10 MG tablet [id number] Yes Take 1 tablet (10 mg total) by mouth daily.  Patient taking differently: Take 10 mg by mouth every Darren day. As needed   [name], DO Taking Active   metolazone  (ZAROXOLYN) 5 MG tablet [id number] Yes Take by mouth. Take 1 tablet (5 mg total) by mouth 1 (one) time each day if needed (for weight gain) [provider] Taking Active   montelukast (SINGULAIR) 10 MG tablet [id number] No Take 1 tablet (10 mg total) by mouth at bedtime.  Patient not taking: Reported on 12/30/2020   [name], DO Not Taking Active   OneTouch Delica Lancets 72C [place] [id number] Yes Use to check blood sugar 3 x daily [name], DO Taking Active   [facility] ULTRA test strip [id number] Yes Check blood sugar 3 x daily [name], DO Taking Active   simethicone (MYLICON) 650 MG chewable tablet [id number] Yes Chew 125 mg by mouth every 6 (six) hours as needed for flatulence. [provider] Taking Active   traMADol (ULTRAM) 50 MG tablet [id number] No Take 1-2 tablets (50-100 mg total) by mouth every 12 (twelve) hours as needed.  Patient not taking: Reported on 02/10/2021   [name], DO Not Taking Active   vitamin C (ASCORBIC ACID) 500 MG tablet [id number] Yes Take 1,000 mg by mouth daily. [provider] Taking Active Self           Med Note [name], [name]   Tue Apr 09, 2015  8:28 AM)              Pertinent Labs:  Lab Results  Component Value Date   HGBA1C 5.2 03/03/2021   Lab Results  Component Value Date   CHOL 121 01/04/2020   HDL 61 01/04/2020   LDLCALC 34 01/04/2020   TRIG 187 (H) 01/04/2020   CHOLHDL 2.0 01/04/2020   Lab Results  Component Value Date   CREATININE 1.83 (H) 10/05/2018   BUN 26 (H) 10/05/2018   NA 137 10/05/2018   K 4.2 10/05/2018   CL 99 10/05/2018   CO2 32 10/05/2018    SDOH:  (Social Determinants of Health) assessments and interventions performed:    [place]  Review of patient past medical history, allergies, medications, health status, including review of consultants reports, laboratory and Darren test data, was performed as part of comprehensive  evaluation and provision of chronic care management services.   Care Plan : PharmD - Med Assistance  Updates made by Rennis Petty, RPH-CPP since 03/12/2021 12:00 AM     Problem: Disease Progression      Long-Range Goal: Disease Progression Prevented or Minimized   Start Date: 04/03/2020  Expected End Date: 07/02/2020  Recent Progress: On track  Priority: High  Note:   Current Barriers:  Financial Barriers  Patient APPROVED to receive Trulicity and Engineer, agricultural through [employer] patient assistance program through 01/25/2022 Lack of blood sugar or blood pressure results for the clinical team Note CM Pharmacist has repeatedly encouraged patient to check BP as recommended by providers and upper arm BP monitor provided. However, patient has declined to monitor BP at home  Pharmacist Clinical Goal(s):  Over the next 90 days, patient will achieve adherence to monitoring guidelines and medication adherence to achieve therapeutic efficacy through collaboration with PharmD and provider.    Interventions: 1:1 collaboration with [name], DO regarding development and update of comprehensive plan of care as evidenced by provider attestation and co-signature Inter-disciplinary care team collaboration (see longitudinal plan of care) Perform chart review. Patient seen for Office Visit with PCP on 2/6 for T2DM follow up. Provider advised: Schedule annual eye apt at [place] [address] send [place] the info on the compression stockings so we can order. Ordered the Cologuard (home kit) test for colon cancer screening  Medication Assistance: Collaborating with Hawthorne Simcox for aid to patient with re-enrollment in [place] Patient Assistance Program (PAP) for [employer] and Trulicity for 5681 calendar year  Received message from [place] patient [place] 02/21/21-01/25/22 for renewal in patient assistance program for Trulicity and Engineer, agricultural from [place] and medication to be  shipped based on last fill date in 2022 and going forward with delivery to the patient's home Today patient reports recently received a supply of Trulicity from program shipped to his home   Type 2 DM Current treatment: Trulicity 2.75 mg weekly Basaglar 10 units daily Denies signs/symptoms of hypoglycemia Counsel on s/s of low blood sugars, importance of monitoring, how to manage lows and to contact office if is having low readings Patient reports has glucose tablets at home Have discussed importance of eating regular well-balanced meals throughout the day, avoiding skipping meals Eye exam: Encourage patient to follow up to schedule annual eye exam   Patient Goals/Self-Care Activities Over the next 90 days, patient will:  - check glucose, document, and provide at future appointments - collaborate with provider on medication access solutions - attend medical appointments as scheduled       Plan: Telephone follow up appointment with care management team member scheduled for:  07/07/2021 at 8:30 am  Wallace Cullens, PharmD, [place], [place] [phone]

## 2021-03-14 ENCOUNTER — Ambulatory Visit: Payer: PPO

## 2021-03-25 DIAGNOSIS — N184 Chronic kidney disease, stage 4 (severe): Secondary | ICD-10-CM

## 2021-03-25 DIAGNOSIS — E1122 Type 2 diabetes mellitus with diabetic chronic kidney disease: Secondary | ICD-10-CM

## 2021-03-25 DIAGNOSIS — Z794 Long term (current) use of insulin: Secondary | ICD-10-CM

## 2021-03-26 ENCOUNTER — Other Ambulatory Visit: Payer: Self-pay | Admitting: Family Medicine

## 2021-03-26 DIAGNOSIS — K219 Gastro-esophageal reflux disease without esophagitis: Secondary | ICD-10-CM

## 2021-03-26 NOTE — Telephone Encounter (Signed)
Requested medication (s) are due for refill today: yes ? ?Requested medication (s) are on the active medication list: yes   ? ?Last refill: 01/16/21  #180  1 refill ? ?Future visit scheduled yes 06/02/21 ? ?Notes to clinic:Failed due to labs, please review. Thank you. ? ?Requested Prescriptions  ?Pending Prescriptions Disp Refills  ? esomeprazole (NEXIUM) 40 MG capsule [Pharmacy Med Name: ESOMEPRAZOLE MAG DR 40 MG CAP] 180 capsule 0  ?  Sig: Take 1 capsule (40 mg total) by mouth 2 (two) times daily before a meal.  ?  ? Gastroenterology: Proton Pump Inhibitors 2 Failed - 03/26/2021  2:49 PM  ?  ?  Failed - ALT in normal range and within 360 days  ?  ALT  ?Date Value Ref Range Status  ?10/05/2018 9 9 - 46 U/L Final  ?  ?  ?  ?  Failed - AST in normal range and within 360 days  ?  AST  ?Date Value Ref Range Status  ?10/05/2018 13 10 - 35 U/L Final  ?  ?  ?  ?  Passed - Valid encounter within last 12 months  ?  Recent Outpatient Visits   ? ?      ? 3 weeks ago Controlled type 2 diabetes mellitus with stage 4 chronic kidney disease, with long-term current use of insulin ([place])  ? [place], DO  ? 4 months ago Acute non-recurrent frontal sinusitis  ? [place], DO  ? 8 months ago Controlled type 2 diabetes mellitus with stage 4 chronic kidney disease, with long-term current use of insulin ([place])  ? [place], DO  ? 1 year ago Acute non-recurrent maxillary sinusitis  ? [place], DO  ? 1 year ago Annual physical exam  ? [place], DO  ? ?  ?  ?Future Appointments   ? ?        ? In 2 months Parks Ranger, [name], DO [facility], [place]  ? ?  ? ?  ?  ?  ? ? ? ? ?

## 2021-04-08 ENCOUNTER — Other Ambulatory Visit: Payer: Self-pay | Admitting: Family Medicine

## 2021-04-08 DIAGNOSIS — I10 Essential (primary) hypertension: Secondary | ICD-10-CM

## 2021-04-08 NOTE — Telephone Encounter (Signed)
Requested medications are due for refill today.  yes ? ?Requested medications are on the active medications list.  yes ? ?Last refill. 04/16/2020 #360 3 refills ? ?Future visit scheduled.   yes ? ?Notes to clinic.  Failed protocol d/t expired labs. ? ? ? ?Requested Prescriptions  ?Pending Prescriptions Disp Refills  ? carvedilol (COREG) 25 MG tablet [Pharmacy Med Name: CARVEDILOL 25 MG TABLET] 360 tablet 0  ?  Sig: Take 2 tablets (50 mg total) by mouth 2 (two) times daily with a meal.  ?  ? Cardiovascular: Beta Blockers 3 Failed - 04/08/2021  4:21 PM  ?  ?  Failed - Cr in normal range and within 360 days  ?  Creat  ?Date Value Ref Range Status  ?10/05/2018 1.83 (H) 0.70 - 1.33 mg/dL Final  ?  Comment:  ?  For patients >34 years of age, the reference limit ?for Creatinine is approximately 13% higher for people ?identified as African-American. ?. ?  ? ?Creatinine, POC  ?Date Value Ref Range Status  ?05/07/2015 0 mg/dL Final  ?  ?  ?  ?  Failed - AST in normal range and within 360 days  ?  AST  ?Date Value Ref Range Status  ?10/05/2018 13 10 - 35 U/L Final  ?  ?  ?  ?  Failed - ALT in normal range and within 360 days  ?  ALT  ?Date Value Ref Range Status  ?10/05/2018 9 9 - 46 U/L Final  ?  ?  ?  ?  Failed - Last BP in normal range  ?  BP Readings from Last 1 Encounters:  ?03/03/21 (!) 141/88  ?  ?  ?  ?  Passed - Last Heart Rate in normal range  ?  Pulse Readings from Last 1 Encounters:  ?03/03/21 80  ?  ?  ?  ?  Passed - Valid encounter within last 6 months  ?  Recent Outpatient Visits   ? ?      ? 1 month ago Controlled type 2 diabetes mellitus with stage 4 chronic kidney disease, with long-term current use of insulin ([place])  ? [place], DO  ? 5 months ago Acute non-recurrent frontal sinusitis  ? [place], DO  ? 8 months ago Controlled type 2 diabetes mellitus with stage 4 chronic kidney disease, with long-term current use of insulin  ([place])  ? [place], DO  ? 1 year ago Acute non-recurrent maxillary sinusitis  ? [place], DO  ? 1 year ago Annual physical exam  ? [place], DO  ? ?  ?  ?Future Appointments   ? ?        ? In 1 month [name], [name], DO [facility], [place]  ? ?  ? ?  ?  ?  ?  ?

## 2021-04-28 DIAGNOSIS — N184 Chronic kidney disease, stage 4 (severe): Secondary | ICD-10-CM | POA: Diagnosis not present

## 2021-04-28 DIAGNOSIS — E1122 Type 2 diabetes mellitus with diabetic chronic kidney disease: Secondary | ICD-10-CM | POA: Diagnosis not present

## 2021-04-28 DIAGNOSIS — N2581 Secondary hyperparathyroidism of renal origin: Secondary | ICD-10-CM | POA: Diagnosis not present

## 2021-04-28 DIAGNOSIS — R809 Proteinuria, unspecified: Secondary | ICD-10-CM | POA: Diagnosis not present

## 2021-05-05 ENCOUNTER — Telehealth: Payer: Self-pay

## 2021-05-05 NOTE — Telephone Encounter (Signed)
Left message for patient to call back and schedule the Medicare Annual Wellness Visit (AWV) virtually, telephone or face to face. ?  ?[name], [place] ?[phone]  ?

## 2021-05-26 ENCOUNTER — Other Ambulatory Visit: Payer: PPO

## 2021-05-26 DIAGNOSIS — Z0000 Encounter for general adult medical examination without abnormal findings: Secondary | ICD-10-CM

## 2021-05-26 DIAGNOSIS — E1169 Type 2 diabetes mellitus with other specified complication: Secondary | ICD-10-CM

## 2021-05-26 DIAGNOSIS — M159 Polyosteoarthritis, unspecified: Secondary | ICD-10-CM

## 2021-05-26 DIAGNOSIS — R351 Nocturia: Secondary | ICD-10-CM

## 2021-05-26 DIAGNOSIS — N184 Chronic kidney disease, stage 4 (severe): Secondary | ICD-10-CM

## 2021-05-26 DIAGNOSIS — M1A39X0 Chronic gout due to renal impairment, multiple sites, without tophus (tophi): Secondary | ICD-10-CM

## 2021-05-26 DIAGNOSIS — E1122 Type 2 diabetes mellitus with diabetic chronic kidney disease: Secondary | ICD-10-CM

## 2021-05-26 DIAGNOSIS — I129 Hypertensive chronic kidney disease with stage 1 through stage 4 chronic kidney disease, or unspecified chronic kidney disease: Secondary | ICD-10-CM

## 2021-05-29 DIAGNOSIS — Z794 Long term (current) use of insulin: Secondary | ICD-10-CM | POA: Diagnosis not present

## 2021-05-29 DIAGNOSIS — I129 Hypertensive chronic kidney disease with stage 1 through stage 4 chronic kidney disease, or unspecified chronic kidney disease: Secondary | ICD-10-CM | POA: Diagnosis not present

## 2021-05-29 DIAGNOSIS — M1A39X0 Chronic gout due to renal impairment, multiple sites, without tophus (tophi): Secondary | ICD-10-CM | POA: Diagnosis not present

## 2021-05-29 DIAGNOSIS — Z0000 Encounter for general adult medical examination without abnormal findings: Secondary | ICD-10-CM | POA: Diagnosis not present

## 2021-05-29 DIAGNOSIS — R351 Nocturia: Secondary | ICD-10-CM | POA: Diagnosis not present

## 2021-05-29 DIAGNOSIS — M159 Polyosteoarthritis, unspecified: Secondary | ICD-10-CM | POA: Diagnosis not present

## 2021-05-29 DIAGNOSIS — E1169 Type 2 diabetes mellitus with other specified complication: Secondary | ICD-10-CM | POA: Diagnosis not present

## 2021-05-29 DIAGNOSIS — N184 Chronic kidney disease, stage 4 (severe): Secondary | ICD-10-CM | POA: Diagnosis not present

## 2021-05-29 DIAGNOSIS — E1122 Type 2 diabetes mellitus with diabetic chronic kidney disease: Secondary | ICD-10-CM | POA: Diagnosis not present

## 2021-05-29 DIAGNOSIS — E785 Hyperlipidemia, unspecified: Secondary | ICD-10-CM | POA: Diagnosis not present

## 2021-05-29 LAB — HM DIABETES EYE EXAM

## 2021-05-30 ENCOUNTER — Other Ambulatory Visit: Payer: PPO

## 2021-05-30 LAB — CBC WITH DIFFERENTIAL/PLATELET
Absolute Monocytes: 449 cells/uL (ref 200–950)
Basophils Absolute: 48 cells/uL (ref 0–200)
Basophils Relative: 0.7 %
Eosinophils Absolute: 186 cells/uL (ref 15–500)
Eosinophils Relative: 2.7 %
HCT: 41.2 % (ref 38.5–50.0)
Hemoglobin: 14.3 g/dL (ref 13.2–17.1)
Lymphs Abs: 1649 cells/uL (ref 850–3900)
MCH: 35.8 pg — ABNORMAL HIGH (ref 27.0–33.0)
MCHC: 34.7 g/dL (ref 32.0–36.0)
MCV: 103.3 fL — ABNORMAL HIGH (ref 80.0–100.0)
MPV: 9.6 fL (ref 7.5–12.5)
Monocytes Relative: 6.5 %
Neutro Abs: 4568 cells/uL (ref 1500–7800)
Neutrophils Relative %: 66.2 %
Platelets: 195 10*3/uL (ref 140–400)
RBC: 3.99 10*6/uL — ABNORMAL LOW (ref 4.20–5.80)
RDW: 14.9 % (ref 11.0–15.0)
Total Lymphocyte: 23.9 %
WBC: 6.9 10*3/uL (ref 3.8–10.8)

## 2021-05-30 LAB — COMPLETE METABOLIC PANEL WITH GFR
AG Ratio: 0.9 (calc) — ABNORMAL LOW (ref 1.0–2.5)
ALT: 11 U/L (ref 9–46)
AST: 15 U/L (ref 10–35)
Albumin: 3.3 g/dL — ABNORMAL LOW (ref 3.6–5.1)
Alkaline phosphatase (APISO): 140 U/L (ref 35–144)
BUN/Creatinine Ratio: 19 (calc) (ref 6–22)
BUN: 40 mg/dL — ABNORMAL HIGH (ref 7–25)
CO2: 34 mmol/L — ABNORMAL HIGH (ref 20–32)
Calcium: 8.8 mg/dL (ref 8.6–10.3)
Chloride: 101 mmol/L (ref 98–110)
Creat: 2.1 mg/dL — ABNORMAL HIGH (ref 0.70–1.30)
Globulin: 3.5 g/dL (calc) (ref 1.9–3.7)
Glucose, Bld: 107 mg/dL — ABNORMAL HIGH (ref 65–99)
Potassium: 3.8 mmol/L (ref 3.5–5.3)
Sodium: 143 mmol/L (ref 135–146)
Total Bilirubin: 0.6 mg/dL (ref 0.2–1.2)
Total Protein: 6.8 g/dL (ref 6.1–8.1)
eGFR: 37 mL/min/{1.73_m2} — ABNORMAL LOW (ref >=60)

## 2021-05-30 LAB — TSH: TSH: 0.96 mIU/L (ref 0.40–4.50)

## 2021-05-30 LAB — LIPID PANEL
Cholesterol: 110 mg/dL (ref <200)
HDL: 66 mg/dL (ref >=40)
LDL Cholesterol (Calc): 29 mg/dL (calc)
Non-HDL Cholesterol (Calc): 44 mg/dL (calc) (ref <130)
Total CHOL/HDL Ratio: 1.7 (calc) (ref <5.0)
Triglycerides: 73 mg/dL (ref <150)

## 2021-05-30 LAB — PSA: PSA: 0.12 ng/mL (ref <=4.00)

## 2021-05-30 LAB — URIC ACID: Uric Acid, Serum: 6.3 mg/dL (ref 4.0–8.0)

## 2021-05-30 LAB — HEMOGLOBIN A1C
Hgb A1c MFr Bld: 5.1 % of total Hgb (ref <5.7)
Mean Plasma Glucose: 100 mg/dL
eAG (mmol/L): 5.5 mmol/L

## 2021-06-02 ENCOUNTER — Encounter: Payer: PPO | Admitting: Family Medicine

## 2021-06-04 ENCOUNTER — Encounter: Payer: PPO | Admitting: Family Medicine

## 2021-06-18 ENCOUNTER — Encounter: Payer: Self-pay | Admitting: Family Medicine

## 2021-06-18 ENCOUNTER — Other Ambulatory Visit: Payer: Self-pay | Admitting: Family Medicine

## 2021-06-18 ENCOUNTER — Ambulatory Visit (INDEPENDENT_AMBULATORY_CARE_PROVIDER_SITE_OTHER): Payer: PPO | Admitting: Family Medicine

## 2021-06-18 VITALS — BP 138/84 | HR 94 | Ht 71.0 in | Wt 339.0 lb

## 2021-06-18 DIAGNOSIS — N184 Chronic kidney disease, stage 4 (severe): Secondary | ICD-10-CM | POA: Diagnosis not present

## 2021-06-18 DIAGNOSIS — I129 Hypertensive chronic kidney disease with stage 1 through stage 4 chronic kidney disease, or unspecified chronic kidney disease: Secondary | ICD-10-CM | POA: Diagnosis not present

## 2021-06-18 DIAGNOSIS — Z0000 Encounter for general adult medical examination without abnormal findings: Secondary | ICD-10-CM

## 2021-06-18 DIAGNOSIS — M159 Polyosteoarthritis, unspecified: Secondary | ICD-10-CM

## 2021-06-18 DIAGNOSIS — Z794 Long term (current) use of insulin: Secondary | ICD-10-CM | POA: Diagnosis not present

## 2021-06-18 DIAGNOSIS — M150 Primary generalized (osteo)arthritis: Secondary | ICD-10-CM

## 2021-06-18 DIAGNOSIS — E1122 Type 2 diabetes mellitus with diabetic chronic kidney disease: Secondary | ICD-10-CM

## 2021-06-18 DIAGNOSIS — Z6841 Body Mass Index (BMI) 40.0 and over, adult: Secondary | ICD-10-CM | POA: Diagnosis not present

## 2021-06-18 DIAGNOSIS — G894 Chronic pain syndrome: Secondary | ICD-10-CM

## 2021-06-18 MED ORDER — TRAMADOL HCL 50 MG PO TABS: 50.0000 mg | ORAL_TABLET | Freq: Two times a day (BID) | ORAL | 2 refills | Status: DC | PRN

## 2021-06-18 NOTE — Progress Notes (Signed)
Subjective:    Patient ID: [name], male    DOB: [birth year], 56 y.o.   MRN: [id number]  [name] is a 56 y.o. male presenting on 06/18/2021 for Annual Exam   HPI  Type 2 Diabetes HTN CKD IV Morbid Obesity BMI >47 Chronic Cardiomyopathy dilated He is following with Dr Juleen China [facility], he has lost 60+ lbs of water weight on diuretic, with improvement overall. Last creatinine 2.10, and eGFR 37 with improved. Last update A1c 5.1 On Carvedilol but not Lisinopril On Trulicity 5.99JT weekly through PAP On Basaglar 10 u daily Followed by Cardiology, on diuretic therapy DM Eye exam here in office, Mild DM Retinopathy on L side. He has improved plant based diet.   Chronic Joint Pain / Arthritis Gout He is on Tramadol PRn for pain cannot take NSAID No recent gout flare On Allopurinol   Erection Dysfunction Asking about low testosterone Has tried sildenafil cialis before without success  Left Eye Subconjunctival Hemorrhage From scratching eye.  Mixed CHF, R sided and systolic Currently followed by [facility] He is doing well on diuretics No significant increased fluid.  Health Maintenance:  Has Cologuard kit, but has not completed.  PSA 0.12 negative     06/18/2021    1:44 PM 03/03/2021    8:11 AM 07/19/2020   10:38 AM  Depression screen PHQ 2/9  Decreased Interest 0 0 0  Down, Depressed, Hopeless 0 0 0  PHQ - 2 Score 0 0 0  Altered sleeping 0 0 0  Tired, decreased energy 0 0 1  Change in appetite 0 0 0  Feeling bad or failure about yourself  0 0 0  Trouble concentrating 0 0 0  Moving slowly or fidgety/restless 0 0 0  Suicidal thoughts 0 0 0  PHQ-9 Score 0 0 1  Difficult doing work/chores Not difficult at all Not difficult at all Not difficult at all    Past Medical History:  Diagnosis Date   Allergy    Asthma    Gout    Hypertension    Morbid obesity ([place])    History reviewed. No pertinent surgical history. Social History    Socioeconomic History   Marital status: Single    Spouse name: Not on file   Number of children: Not on file   Years of education: Not on file   Highest education level: Not on file  Occupational History   Not on file  Tobacco Use   Smoking status: Never   Smokeless tobacco: Never  Substance and Sexual Activity   Alcohol use: Yes    Comment: a glass of wine and a couple beers a day   Drug use: No   Sexual activity: Not on file  Other Topics Concern   Not on file  Social History Narrative   Not on file   Social Determinants of Health   Financial Resource Strain: Not on file  Food Insecurity: Not on file  Transportation Needs: Not on file  Physical Activity: Not on file  Stress: Not on file  Social Connections: Not on file  Intimate Partner Violence: Not on file   Family History  Problem Relation Age of Onset   Diabetes Mother    Hypertension Mother    Alcohol abuse Father    Glaucoma Father    Diabetes Father    Hypertension Father    Cancer Neg Hx    COPD Neg Hx    Heart disease Neg Hx  Stroke Neg Hx    Current Outpatient Medications on File Prior to Visit  Medication Sig   albuterol (VENTOLIN HFA) 108 (90 Base) MCG/ACT inhaler Inhale 2 puffs into the lungs every 4 (four) hours as needed for wheezing or shortness of breath.   allopurinol (ZYLOPRIM) 100 MG tablet Take 2 tablets (200 mg total) by mouth daily.   aspirin 81 MG chewable tablet Chew 81 mg by mouth daily.   atorvastatin (LIPITOR) 20 MG tablet Take 1 tablet (20 mg total) by mouth daily.   blood glucose meter kit and supplies KIT Brand of choice; LON 99 months; check FSBS 3x a day; E11.65; disp one meter with 100 strips + 1 refill of strips   Blood Glucose Monitoring Suppl (ONE TOUCH ULTRA 2) w/Device KIT Use to check blood glucose up to 3 x daily   carvedilol (COREG) 25 MG tablet Take 2 tablets (50 mg total) by mouth 2 (two) times daily with a meal.   Dulaglutide (TRULICITY) 8.54 OE/7.0JJ SOPN  Inject 0.75 mg into the skin once a week.   esomeprazole (NEXIUM) 40 MG capsule Take 1 capsule (40 mg total) by mouth 2 (two) times daily before a meal.   fluticasone (FLONASE) 50 MCG/ACT nasal spray Place 2 sprays into both nostrils daily. Use for 4-6 weeks then stop and use seasonally or as needed.   Insulin Glargine (BASAGLAR KWIKPEN) 100 UNIT/ML SOPN Inject 10 Units into the skin daily. Submitted to [employer] for assistance   Insulin Pen Needle (B-D UF III MINI PEN NEEDLES) 31G X 5 MM MISC 1 each by Does not apply route daily. Use once daily with Victoza.   loratadine (CLARITIN) 10 MG tablet Take 1 tablet (10 mg total) by mouth daily. (Patient taking differently: Take 10 mg by mouth every other day. As needed)   metolazone (ZAROXOLYN) 5 MG tablet Take by mouth. Take 1 tablet (5 mg total) by mouth 1 (one) time each day if needed (for weight gain)   montelukast (SINGULAIR) 10 MG tablet Take 1 tablet (10 mg total) by mouth at bedtime.   OneTouch Delica Lancets 00X MISC Use to check blood sugar 3 x daily   ONETOUCH ULTRA test strip Check blood sugar 3 x daily   simethicone (MYLICON) 381 MG chewable tablet Chew 125 mg by mouth every 6 (six) hours as needed for flatulence.   vitamin C (ASCORBIC ACID) 500 MG tablet Take 1,000 mg by mouth daily.   albuterol (PROVENTIL) (2.5 MG/3ML) 0.083% nebulizer solution Take 3 mLs (2.5 mg total) by nebulization every 6 (six) hours as needed for wheezing or shortness of breath. (Patient not taking: Reported on 02/10/2021)   bumetanide (BUMEX) 1 MG tablet Take by mouth. Take 2 tablets (2 mg total) by mouth in the morning and 2 tablets (2 mg total) at noon and 2 tablets (2 mg total) in the evening.   No current facility-administered medications on file prior to visit.    Review of Systems  Constitutional:  Negative for activity change, appetite change, chills, diaphoresis, fatigue and fever.  HENT:  Negative for congestion and hearing loss.   Eyes:  Negative for  visual disturbance.  Respiratory:  Negative for cough, chest tightness, shortness of breath and wheezing.   Cardiovascular:  Negative for chest pain, palpitations and leg swelling.  Gastrointestinal:  Negative for abdominal pain, constipation, diarrhea, nausea and vomiting.  Genitourinary:  Negative for dysuria, frequency and hematuria.  Musculoskeletal:  Negative for arthralgias and neck pain.  Skin:  Negative for rash.  Neurological:  Negative for dizziness, weakness, light-headedness, numbness and headaches.  Hematological:  Negative for adenopathy.  Psychiatric/Behavioral:  Negative for behavioral problems, dysphoric mood and sleep disturbance.   Per HPI unless specifically indicated above     Objective:    BP 138/84 (BP Location: Left Arm, Cuff Size: Normal)   Pulse 94   Ht _0  (1.803 m)   Wt (!) 339 lb (153.8 kg)   SpO2 97%   BMI 47.28 kg/m   Wt Readings from Last 3 Encounters:  06/18/21 (!) 339 lb (153.8 kg)  03/03/21 (!) 342 lb (155.1 kg)  10/30/20 (!) 335 lb 8 oz (152.2 kg)    Physical Exam Vitals and nursing note reviewed.  Constitutional:      General: He is not in acute distress.    Appearance: He is well-developed. He is obese. He is not diaphoretic.     Comments: Well-appearing, comfortable, cooperative  HENT:     Head: Normocephalic and atraumatic.  Eyes:     General:        Right eye: No discharge.        Left eye: No discharge.     Conjunctiva/sclera: Conjunctivae normal.     Pupils: Pupils are equal, round, and reactive to light.  Neck:     Thyroid: No thyromegaly.  Cardiovascular:     Rate and Rhythm: Normal rate and regular rhythm.     Pulses: Normal pulses.     Heart sounds: Normal heart sounds. No murmur heard. Pulmonary:     Effort: Pulmonary effort is normal. No respiratory distress.     Breath sounds: Normal breath sounds. No wheezing or rales.  Abdominal:     General: Bowel sounds are normal. There is no distension.     Palpations:  Abdomen is soft. There is no mass.     Tenderness: There is no abdominal tenderness.  Musculoskeletal:        General: No tenderness. Normal range of motion.     Cervical back: Normal range of motion and neck supple.     Right lower leg: No edema.     Left lower leg: No edema.     Comments: Upper / Lower Extremities: - Normal muscle tone, strength bilateral upper extremities 5/5, lower extremities 5/5  Lymphadenopathy:     Cervical: No cervical adenopathy.  Skin:    General: Skin is warm and dry.     Findings: No erythema or rash.  Neurological:     Mental Status: He is alert and oriented to person, place, and time.     Comments: Distal sensation intact to light touch all extremities  Psychiatric:        Mood and Affect: Mood normal.        Behavior: Behavior normal.        Thought Content: Thought content normal.     Comments: Well groomed, good eye contact, normal speech and thoughts   Results for orders placed or performed in visit on 06/02/21  HM DIABETES EYE EXAM  Result Value Ref Range   HM Diabetic Eye Exam Retinopathy (A) No Retinopathy      Assessment & Plan:   Problem List Items Addressed This Visit     Type 2 diabetes mellitus, controlled, with renal complications ([place])   Primary osteoarthritis involving multiple joints   Morbid obesity with BMI of 45.0-49.9, adult ([place])   CKD (chronic kidney disease), stage IV ([place])   Benign hypertension with CKD (chronic  kidney disease) stage IV ([place])   Other Visit Diagnoses     Annual physical exam    -  Primary       Updated Health Maintenance information Reviewed recent lab results with patient Encouraged improvement to lifestyle with diet and exercise Goal of weight loss  Keep on Trulicity 4.60QN weekly  Caution with Insulin 10 units per day, if you run low sugars at all < 90 consistently or feel sick hypoglycemia, you should reduce dose or stop insulin for now.  Kidney function 37, improved.  Recommend  complete the Cologuard test.  No orders of the defined types were placed in this encounter.     Follow up plan: Return in about 5 months (around 11/18/2021) for 5 month follow-up DM A1c, updates.  [name], [facility] 06/18/2021, 8:32 AM

## 2021-06-18 NOTE — Patient Instructions (Addendum)
Thank you for coming to the office today.  Recent Labs    07/19/20 1055 03/03/21 0816 05/29/21 0923  HGBA1C 7.0* 5.2 5.1   Keep on Trulicity 0.75mg  weekly  Caution with Insulin 10 units per day, if you run low sugars at all < 90 consistently or feel sick hypoglycemia, you should reduce dose or stop insulin for now.  Kidney function 37, improved.  Recommend complete the Cologuard test.   Please schedule a Follow-up Appointment to: Return in about 5 months (around 11/18/2021) for 5 month follow-up DM A1c, updates.  If you have any other questions or concerns, please feel free to call the office or send a message through [place]. You may also schedule an earlier appointment if necessary.  Additionally, you may be receiving a survey about your experience at our office within a few days to 1 week by e-mail or mail. We value your feedback.  [name], DO [place]

## 2021-07-03 ENCOUNTER — Ambulatory Visit (INDEPENDENT_AMBULATORY_CARE_PROVIDER_SITE_OTHER): Payer: PPO

## 2021-07-03 DIAGNOSIS — Z0000 Encounter for general adult medical examination without abnormal findings: Secondary | ICD-10-CM | POA: Diagnosis not present

## 2021-07-03 NOTE — Progress Notes (Signed)
Subjective:    I connected with  [name] on 07/03/21 by a audio enabled telemedicine application and verified that I am speaking with the correct person using two identifiers.  Patient Location: Home  Provider Location: Office/Clinic  I discussed the limitations of evaluation and management by telemedicine. The patient expressed understanding and agreed to proceed.    [name] is a 56 y.o. male who presents for Medicare Annual/Subsequent preventive examination.  Review of Systems    Per HPI unless specifically indicated below         Objective:    Today's Vitals   07/03/21 1512  PainSc: 0-No pain   There is no height or weight on file to calculate BMI.     01/24/2019    1:48 PM 03/11/2018    2:47 PM 02/28/2018    9:05 AM 01/05/2018    6:37 PM 10/04/2016   10:28 AM  Advanced Directives  Does Patient Have a Medical Advance Directive? _0   Would patient like information on creating a medical advance directive?  Yes (MAU/Ambulatory/Procedural Areas - Information given) No - Patient declined No - Patient declined     Current Medications (verified) Outpatient Encounter Medications as of 07/03/2021  Medication Sig   albuterol (PROVENTIL) (2.5 MG/3ML) 0.083% nebulizer solution Take 3 mLs (2.5 mg total) by nebulization every 6 (six) hours as needed for wheezing or shortness of breath.   albuterol (VENTOLIN HFA) 108 (90 Base) MCG/ACT inhaler Inhale 2 puffs into the lungs every 4 (four) hours as needed for wheezing or shortness of breath.   allopurinol (ZYLOPRIM) 100 MG tablet Take 2 tablets (200 mg total) by mouth daily.   aspirin 81 MG chewable tablet Chew 81 mg by mouth daily.   atorvastatin (LIPITOR) 20 MG tablet Take 1 tablet (20 mg total) by mouth daily.   blood glucose meter kit and supplies KIT Brand of choice; LON 99 months; check FSBS 3x a day; E11.65; disp one meter with 100 strips + 1 refill of strips   Blood Glucose Monitoring Suppl (ONE TOUCH  ULTRA 2) w/Device KIT Use to check blood glucose up to 3 x daily   carvedilol (COREG) 25 MG tablet Take 2 tablets (50 mg total) by mouth 2 (two) times daily with a meal.   Dulaglutide (TRULICITY) 2.50 IB/7.0WU SOPN Inject 0.75 mg into the skin once a week.   esomeprazole (NEXIUM) 40 MG capsule Take 1 capsule (40 mg total) by mouth 2 (two) times daily before a meal.   fluticasone (FLONASE) 50 MCG/ACT nasal spray Place 2 sprays into both nostrils daily. Use for 4-6 weeks then stop and use seasonally or as needed.   Insulin Glargine (BASAGLAR KWIKPEN) 100 UNIT/ML SOPN Inject 10 Units into the skin daily. Submitted to [employer] for assistance   Insulin Pen Needle (B-D UF III MINI PEN NEEDLES) 31G X 5 MM MISC 1 each by Does not apply route daily. Use once daily with Victoza.   loratadine (CLARITIN) 10 MG tablet Take 1 tablet (10 mg total) by mouth daily. (Patient taking differently: Take 10 mg by mouth every other day. As needed)   metolazone (ZAROXOLYN) 5 MG tablet Take by mouth. Take 1 tablet (5 mg total) by mouth 1 (one) time each day if needed (for weight gain)   OneTouch Delica Lancets 88B MISC Use to check blood sugar 3 x daily   ONETOUCH ULTRA test strip Check blood sugar 3 x daily   simethicone (MYLICON)  125 MG chewable tablet Chew 125 mg by mouth every 6 (six) hours as needed for flatulence.   traMADol (ULTRAM) 50 MG tablet Take 1-2 tablets (50-100 mg total) by mouth every 12 (twelve) hours as needed.   vitamin C (ASCORBIC ACID) 500 MG tablet Take 1,000 mg by mouth daily.   bumetanide (BUMEX) 1 MG tablet Take by mouth. Take 2 tablets (2 mg total) by mouth in the morning and 2 tablets (2 mg total) at noon and 2 tablets (2 mg total) in the evening.   montelukast (SINGULAIR) 10 MG tablet Take 1 tablet (10 mg total) by mouth at bedtime. (Patient not taking: Reported on 07/03/2021)   potassium chloride (KLOR-CON) 10 MEQ tablet Take 10 mEq by mouth daily.   No facility-administered encounter  medications on file as of 07/03/2021.    Allergies (verified) Talc   History: Past Medical History:  Diagnosis Date   Allergy    Asthma    Gout    Hypertension    Morbid obesity ([place])    No past surgical history on file. Family History  Problem Relation Age of Onset   Diabetes Mother    Hypertension Mother    Alcohol abuse Father    Glaucoma Father    Diabetes Father    Hypertension Father    Cancer Neg Hx    COPD Neg Hx    Heart disease Neg Hx    Stroke Neg Hx    Social History   Socioeconomic History   Marital status: Single    Spouse name: Not on file   Number of children: Not on file   Years of education: Not on file   Highest education level: Not on file  Occupational History   Not on file  Tobacco Use   Smoking status: Never   Smokeless tobacco: Never  Substance and Sexual Activity   Alcohol use: Yes    Comment: a glass of wine and a couple beers a day   Drug use: No   Sexual activity: Not on file  Other Topics Concern   Not on file  Social History Narrative   Not on file   Social Determinants of Health   Financial Resource Strain: Not on file  Food Insecurity: Not on file  Transportation Needs: Not on file  Physical Activity: Not on file  Stress: Not on file  Social Connections: Not on file    Tobacco Counseling Counseling given: Not Answered   Clinical Intake:  Pre-visit preparation completed: No  Pain : No/denies pain Pain Score: 0-No pain     Nutritional Status: BMI > 30  Obese Nutritional Risks: None Diabetes: Yes CBG done?: No CBG resulted in Enter/ Edit results?: No Did pt. bring in CBG monitor from home?: No  How often do you need to have someone help you when you read instructions, pamphlets, or other written materials from your doctor or pharmacy?: 1 - Never  Diabetic?Nutrition Risk Assessment:  Has the patient had any N/V/D within the last 2 months?  No  Does the patient have any non-healing wounds?  No  Has the  patient had any unintentional weight loss or weight gain?  No   Diabetes:  Is the patient diabetic?  Yes  If diabetic, was a CBG obtained today?  No  Did the patient bring in their glucometer from home?  No  How often do you monitor your CBG's? Occasionally   Financial Strains and Diabetes Management:  Are you having any financial strains  with the device, your supplies or your medication? No .  Does the patient want to be seen by Chronic Care Management for management of their diabetes?  No  Would the patient like to be referred to a Nutritionist or for Diabetic Management?  No   Diabetic Exams:  Diabetic Eye Exam: Completed 05/29/2021 Diabetic Foot Exam: Completed 07/19/2020    Interpreter Needed?: No  Information entered by :: [name], DO   Activities of Daily Living    07/03/2021    3:30 PM  In your present state of health, do you have any difficulty performing the following activities:  Hearing? 0  Vision? 1  Difficulty concentrating or making decisions? 0  Walking or climbing stairs? 1  Dressing or bathing? 0  Doing errands, shopping? 0    Patient Care Team: [name], DO as PCP - General (Family Medicine) Curley Spice [name], RPH-CPP as Pharmacist [name], LCSW as Social Worker (Licensed Clinical Social Worker) Vanita Ingles, RN as Case Manager (General Practice)  Indicate any recent Medical Services you may have received from other than [facility] providers in the past year (date may be approximate). No hospitalizations in the past 12 months.     Assessment:   This is a routine wellness examination for [name].  Hearing/Vision screen No results found.  Dietary issues and exercise activities discussed:   Pt report that his diet consist of mostly fish and chicken. He has cut down on the amount of red meat that he consume, He reports his diet consist of a little to almost no red meat.    Goals Addressed             This  Visit's Progress    Eat Healthy        Depression Screen    06/18/2021    1:44 PM 03/03/2021    8:11 AM 07/19/2020   10:38 AM 01/04/2020    8:56 AM 06/27/2019    8:10 AM 05/18/2019    3:27 PM 01/24/2019    1:57 PM  PHQ 2/9 Scores  PHQ - 2 Score 0 0 0 0 0 0 0  PHQ- 9 Score 0 0 1 0       Fall Risk    07/03/2021    3:08 PM 06/18/2021    1:43 PM 03/03/2021    8:11 AM 07/19/2020   10:38 AM 01/04/2020    8:56 AM  [place] in the past year? 0 0 0 1 1  Number falls in past yr: 0 0 0 1 1  Injury with Fall? 0 0 0 0 0  Risk for fall due to : No Fall Risks History of fall(s) History of fall(s)  History of fall(s)  Follow up _0     FALL RISK PREVENTION PERTAINING TO THE HOME:  Any stairs in or around the home? No  If so, are there any without handrails? Yes  Home free of loose throw rugs in walkways, pet beds, electrical cords, etc? Yes  Adequate lighting in your home to reduce risk of falls? Yes   ASSISTIVE DEVICES UTILIZED TO PREVENT FALLS:  Life alert? No  Use of a cane, walker or w/c? No  Grab bars in the bathroom? No  Shower chair or bench in shower? No  Elevated toilet seat or a handicapped toilet? No   Cognitive Function:  07/03/2021    3:18 PM  6CIT Screen  What Year? 0 points  What month? 0 points  What time? 0 points  Count back from 20 0 points  Months in reverse 0 points  Repeat phrase 0 points  Total Score 0 points    Immunizations Immunization History  Administered Date(s) Administered   Influenza, Seasonal, Injecte, Preservative Fre 11/30/2007   Influenza,inj,Quad PF,6+ Mos 01/17/2015, 10/15/2016   Influenza,inj,quad, With Preservative 11/11/2017   Influenza-Unspecified 01/03/2014, 10/22/2015, 11/09/2017   PPD Test 07/02/2015   Pneumococcal Polysaccharide-23 01/17/2015   Td 02/14/2003    TDAP status: Due,  Education has been provided regarding the importance of this vaccine. Advised may receive this vaccine at local pharmacy or Health Dept. Aware to provide a copy of the vaccination record if obtained from local pharmacy or Health Dept. Verbalized acceptance and understanding.  Flu Vaccine status: Up to date  Pneumococcal vaccine status: Up to date  Covid-19 vaccine status: Declined, Education has been provided regarding the importance of this vaccine but patient still declined. Advised may receive this vaccine at local pharmacy or Health Dept.or vaccine clinic. Aware to provide a copy of the vaccination record if obtained from local pharmacy or Health Dept. Verbalized acceptance and understanding.  Qualifies for Shingles Vaccine? Yes   Zostavax completed No   Shingrix Completed?: No.    Education has been provided regarding the importance of this vaccine. Patient has been advised to call insurance company to determine out of pocket expense if they have not yet received this vaccine. Advised may also receive vaccine at local pharmacy or Health Dept. Verbalized acceptance and understanding.  Screening Tests Health Maintenance  Topic Date Due   COVID-19 Vaccine (1) Never done   Hepatitis C Screening  Never done   Fecal DNA (Cologuard)  Never done   Zoster Vaccines- Shingrix (1 of 2) Never done   TETANUS/TDAP  02/13/2023 (Originally 02/13/2013)   FOOT EXAM  07/19/2021   INFLUENZA VACCINE  08/26/2021   HEMOGLOBIN A1C  11/29/2021   OPHTHALMOLOGY EXAM  05/30/2022   HIV Screening  Completed   HPV VACCINES  Aged Out    Health Maintenance  Health Maintenance Due  Topic Date Due   COVID-19 Vaccine (1) Never done   Hepatitis C Screening  Never done   Fecal DNA (Cologuard)  Never done   Zoster Vaccines- Shingrix (1 of 2) Never done    Colon Cancer Screening: Order placed, the patient currently have the kit at the house.  Lung Cancer Screening: (Low Dose CT Chest recommended if Age 58-80  years, 30 pack-year currently smoking OR have quit w/in 15years.) does not qualify.   Lung Cancer Screening Referral: does not qualify   Additional Screening:  Hepatitis C Screening: does not qualify; Completed   Vision Screening: Recommended annual ophthalmology exams for early detection of glaucoma and other disorders of the eye. Is the patient up to date with their annual eye exam?  Yes  Who is the provider or what is the name of the office in which the patient attends annual eye exams? [facility]  If pt is not established with a provider, would they like to be referred to a provider to establish care? No .   Dental Screening: Recommended annual dental exams for proper oral hygiene  Community Resource Referral / Chronic Care Management: CRR required this visit?  No   CCM required this visit?  No      Plan:  I have personally reviewed and noted the following in the patient's chart:   Medical and social history Use of alcohol, tobacco or illicit drugs  Current medications and supplements including opioid prescriptions. Patient is not currently taking opioid prescriptions. Functional ability and status Nutritional status Physical activity Advanced directives List of other physicians Hospitalizations, surgeries, and ER visits in previous 12 months Vitals Screenings to include cognitive, depression, and falls Referrals and appointments  In addition, I have reviewed and discussed with patient certain preventive protocols, quality metrics, and best practice recommendations. A written personalized care plan for preventive services as well as general preventive health recommendations were provided to patient.     Wilson Singer, [place]   07/03/2021   Nurse Notes: Non-face to face visit. The patient confirmed he has the cologuard kit at his home and that he will complete the test and send it back in.

## 2021-07-07 ENCOUNTER — Ambulatory Visit (INDEPENDENT_AMBULATORY_CARE_PROVIDER_SITE_OTHER): Payer: PPO | Admitting: Pharmacist

## 2021-07-07 DIAGNOSIS — E1122 Type 2 diabetes mellitus with diabetic chronic kidney disease: Secondary | ICD-10-CM

## 2021-07-07 NOTE — Patient Instructions (Addendum)
Visit Information  Thank you for taking time to visit with me today. Please don't hesitate to contact me if I can be of assistance to you before our next scheduled telephone appointment.  Following are the goals we discussed today:   Goals Addressed             This Visit's Progress    Pharmacy Goals       Our goal A1c is less than 7%. This corresponds with fasting sugars less than 130 and 2 hour after meal sugars less than 180. Please check your blood sugar and keep a log of the results  Our goal bad cholesterol, or LDL, is less than 70 . This is why it is important to continue taking your atorvastatin.  Please check your daily weights, as well as follow fluid and salt instructions, as directed by your kidney doctor. Please keep a record of daily weights and bring this with you to your medical appointments  Please check your home blood pressure, keep a log of the results and bring this with you to your medical appointments.  Feel free to call me with any questions or concerns. I look forward to our next call!   Wallace Cullens, PharmD, [place] [phone]         Our next appointment is by telephone on 12/10/2021 at 8:30 AM  Please call the care guide team at [phone] if you need to cancel or reschedule your appointment.    The patient verbalized understanding of instructions, educational materials, and care plan provided today and DECLINED offer to receive copy of patient instructions, educational materials, and care plan.

## 2021-07-07 NOTE — Chronic Care Management (AMB) (Signed)
Chronic Care Management CCM Pharmacy Note  07/07/2021 Name:  [name] MRN:  [id number] DOB:  1965-05-10   Subjective: [name] is an 56 y.o. year old male who is a primary patient of [name], DO.  The CCM team was consulted for assistance with disease management and care coordination needs.    Engaged with patient by telephone for follow up visit for pharmacy case management and/or care coordination services.   Objective:  Medications Reviewed Today     Reviewed by Rennis Petty, RPH-CPP (Pharmacist) on 07/07/21 at [place] List Status: <None>   Medication Order Taking? Sig Documenting Provider Last Dose Status Informant  albuterol (PROVENTIL) (2.5 MG/3ML) 0.083% nebulizer solution [id number]  Take 3 mLs (2.5 mg total) by nebulization every 6 (six) hours as needed for wheezing or shortness of breath. [name], [name], DO  Active   albuterol (VENTOLIN HFA) 108 (90 Base) MCG/ACT inhaler [id number]  Inhale 2 puffs into the lungs every 4 (four) hours as needed for wheezing or shortness of breath. [name], DO  Active   allopurinol (ZYLOPRIM) 100 MG tablet [id number]  Take 2 tablets (200 mg total) by mouth daily. [name], DO  Active   aspirin 81 MG chewable tablet [id number]  Chew 81 mg by mouth daily. [provider]  Active Self  atorvastatin (LIPITOR) 20 MG tablet [id number]  Take 1 tablet (20 mg total) by mouth daily. [name], DO  Active   blood glucose meter kit and supplies KIT [id number]  Brand of choice; LON 99 months; check FSBS 3x a day; E11.65; disp one meter with 100 strips + 1 refill of strips [name], [name], MD  Active Self  Blood Glucose Monitoring Suppl (ONE TOUCH ULTRA 2) w/Device KIT [id number]  Use to check blood glucose up to 3 x daily [name], [name], DO  Active   bumetanide (BUMEX) 1 MG tablet [id number]  Take by mouth. Take 2 tablets (2 mg total) by mouth in the  morning and 2 tablets (2 mg total) at noon and 2 tablets (2 mg total) in the evening. [provider]  Expired 02/26/21 2359   calcitRIOL (ROCALTROL) 0.5 MCG capsule [id number] Yes Take 2 capsules (1 mcg total) by mouth 1 (one) time each day [provider]  Active   carvedilol (COREG) 25 MG tablet [id number]  Take 2 tablets (50 mg total) by mouth 2 (two) times daily with a meal. [name], [name], DO  Active   Dulaglutide (TRULICITY) 0.73 XT/0.6YI SOPN [id number] Yes Inject 0.75 mg into the skin once a week. [provider] Taking Active            Med Note [name], Oden Lindaman A   Wed Jul 20, 2018  5:42 PM) Taking on Fridays  esomeprazole (NEXIUM) 40 MG capsule [id number]  Take 1 capsule (40 mg total) by mouth 2 (two) times daily before a meal. Parks Ranger, [name], DO  Active   fluticasone (FLONASE) 50 MCG/ACT nasal spray [id number]  Place 2 sprays into both nostrils daily. Use for 4-6 weeks then stop and use seasonally or as needed. [name], DO  Active   Insulin Glargine [facility] KWIKPEN) 100 UNIT/ML SOPN [id number] Yes Inject 10 Units into the skin daily. Submitted to [employer] for assistance [name], DO Taking Active Self           Med Note Ventura Sellers Jan 15, 2020 10:34  AM)    Insulin Pen Needle (B-D UF III MINI PEN NEEDLES) 31G X 5 MM MISC [id number]  1 each by Does not apply route daily. Use once daily with Victoza. [name], DO  Active Self  loratadine (CLARITIN) 10 MG tablet [id number]  Take 1 tablet (10 mg total) by mouth daily.  Patient taking differently: Take 10 mg by mouth every other day. As needed   [name], DO  Active   metolazone (ZAROXOLYN) 5 MG tablet [id number] No Take 1 tablet (5 mg total) by mouth 1 (one) time each day if needed (for weight gain)  Patient not taking: Reported on 07/07/2021   [provider] Not Taking Active   montelukast (SINGULAIR) 10 MG  tablet [id number]  Take 1 tablet (10 mg total) by mouth at bedtime.  Patient not taking: Reported on 07/03/2021   [name], DO  Active   OneTouch Delica Lancets 19Q MISC [id number]  Use to check blood sugar 3 x daily [name], DO  Active   [facility] ULTRA test strip [id number]  Check blood sugar 3 x daily [name], DO  Active   potassium chloride (KLOR-CON) 10 MEQ tablet [id number] Yes Take 10 mEq by mouth daily. [provider] Taking Active   simethicone (MYLICON) 149 MG chewable tablet [id number]  Chew 125 mg by mouth every 6 (six) hours as needed for flatulence. [provider]  Active   traMADol (ULTRAM) 50 MG tablet [id number]  Take 1-2 tablets (50-100 mg total) by mouth every 12 (twelve) hours as needed. [name], DO  Active   vitamin C (ASCORBIC ACID) 500 MG tablet [id number]  Take 1,000 mg by mouth daily. [provider]  Active Self           Med Note [name], [name]   Tue Apr 09, 2015  8:28 AM)              Pertinent Labs:  Lab Results  Component Value Date   HGBA1C 5.1 05/29/2021   Lab Results  Component Value Date   CHOL 110 05/29/2021   HDL 66 05/29/2021   LDLCALC 29 05/29/2021   TRIG 73 05/29/2021   CHOLHDL 1.7 05/29/2021   Lab Results  Component Value Date   CREATININE 2.10 (H) 05/29/2021   BUN 40 (H) 05/29/2021   NA 143 05/29/2021   K 3.8 05/29/2021   CL 101 05/29/2021   CO2 34 (H) 05/29/2021    SDOH:  (Social Determinants of Health) assessments and interventions performed:    [place]  Review of patient past medical history, allergies, medications, health status, including review of consultants reports, laboratory and other test data, was performed as part of comprehensive evaluation and provision of chronic care management services.   Care Plan : PharmD - Med Assistance  Updates made by Rennis Petty, RPH-CPP since 07/07/2021 12:00 AM     Problem: Disease  Progression      Long-Range Goal: Disease Progression Prevented or Minimized   Start Date: 04/03/2020  Expected End Date: 07/02/2020  Recent Progress: On track  Priority: High  Note:   Current Barriers:  Financial Barriers  Patient APPROVED to receive Trulicity and Engineer, agricultural through [employer] patient assistance program through 01/25/2022 Lack of blood sugar or blood pressure results for the clinical team Note CM Pharmacist has repeatedly encouraged patient to check BP as recommended by providers and upper arm BP monitor provided. However, patient has declined to monitor  BP at home   Pharmacist Clinical Goal(s):  Over the next 90 days, patient will achieve adherence to monitoring guidelines and medication adherence to achieve therapeutic efficacy through collaboration with PharmD and provider.    Interventions: 1:1 collaboration with [name], DO regarding development and update of comprehensive plan of care as evidenced by provider attestation and co-signature Inter-disciplinary care team collaboration (see longitudinal plan of care) Perform chart review.  Patient seen for Office Visit with [facility] on 4/3 Per telephone encounter from [facility] on 4/13, patient was advised to start taking vitamin D OTC and potassium and to come in for a renal panel, two weeks after starting potassium" Office Visit with PCP for annual physical on 5/24. Provider advised patient: Keep on Trulicity 3.53GD weekly Caution with Insulin 10 units per day, if you run low sugars at all < 90 consistently or feel sick hypoglycemia, you should reduce dose or stop insulin for now Complete the Cologuard test Unable to perform medication review today as patient reports he is not currently home Today patient confirms taking potassium as directed by Nephrologist, but denies having started the OTC Vitamin D as directed on 4/13 Patient reports he will call [facility] to find out what strength of Vitamin D he was to start and then pick this up OTC and start taking   Type 2 DM Current treatment: Trulicity 9.24 mg weekly Basaglar 10 units daily Reports recent home blood sugar readings ranging 110s-120s Denies signs/symptoms of hypoglycemia Have counseled on s/s of low blood sugars, how to manage lows and to contact office if is having low readings Have discussed importance of eating regular well-balanced meals throughout the day, avoiding skipping meals   Patient Goals/Self-Care Activities Over the next 90 days, patient will:  - check glucose, document, and provide at future appointments - collaborate with provider on medication access solutions - attend medical appointments as scheduled       Plan: Telephone follow up appointment with care management team member scheduled for:  12/10/2021 at 8:30 AM  Wallace Cullens, PharmD, [place], [place] [phone]

## 2021-07-25 DIAGNOSIS — E1122 Type 2 diabetes mellitus with diabetic chronic kidney disease: Secondary | ICD-10-CM

## 2021-07-25 DIAGNOSIS — Z794 Long term (current) use of insulin: Secondary | ICD-10-CM

## 2021-07-25 DIAGNOSIS — N184 Chronic kidney disease, stage 4 (severe): Secondary | ICD-10-CM

## 2021-07-27 DIAGNOSIS — T25231A Burn of second degree of right toe(s) (nail), initial encounter: Secondary | ICD-10-CM | POA: Diagnosis not present

## 2021-10-07 ENCOUNTER — Other Ambulatory Visit: Payer: Self-pay | Admitting: Family Medicine

## 2021-10-07 DIAGNOSIS — K219 Gastro-esophageal reflux disease without esophagitis: Secondary | ICD-10-CM

## 2021-10-08 NOTE — Telephone Encounter (Signed)
Requested Prescriptions  Pending Prescriptions Disp Refills  . esomeprazole (NEXIUM) 40 MG capsule [Pharmacy Med Name: ESOMEPRAZOLE MAG DR 40 MG CAP] 180 capsule 0    Sig: Take 1 capsule (40 mg total) by mouth 2 (two) times daily before a meal.     Gastroenterology: Proton Pump Inhibitors 2 Passed - 10/07/2021  8:40 AM      Passed - ALT in normal range and within 360 days    ALT  Date Value Ref Range Status  05/29/2021 11 9 - 46 U/L Final         Passed - AST in normal range and within 360 days    AST  Date Value Ref Range Status  05/29/2021 15 10 - 35 U/L Final         Passed - Valid encounter within last 12 months    Recent Outpatient Visits          3 months ago Annual physical exam   [place], DO   7 months ago Controlled type 2 diabetes mellitus with stage 4 chronic kidney disease, with long-term current use of insulin ([place])   [facility] [name], DO   11 months ago Acute non-recurrent frontal sinusitis   [place], DO   1 year ago Controlled type 2 diabetes mellitus with stage 4 chronic kidney disease, with long-term current use of insulin [facility])   [place], DO   1 year ago Acute non-recurrent maxillary sinusitis   [place], DO      Future Appointments            In 1 month Parks Ranger, [name], [facility], [facility]

## 2021-10-27 ENCOUNTER — Telehealth: Payer: Self-pay | Admitting: Pharmacist

## 2021-10-27 NOTE — Chronic Care Management (AMB) (Signed)
  Chronic Care Management   Outreach Note  10/27/2021 Name: [name] MRN: [id number] DOB: [birth year]  Referred by: [name], DO Reason for referral : No chief complaint on file.   Receive a message from [place] advising that she received a message from patient requesting a call back. Note patient reached out to [employer] last week regarding a request for compression stockings and toilet seat topper and [facility] forwarded message to PCP.  Outreach to patient today by telephone. Patient reports that on Saturday night he had a fall on his buttocks. Reports he felt bruised, but denies other injury. Advise patient to follow up with office regarding concern about recent fall, but patient denies need to contact PCP about the fall.   Patient also reports that he has a rash near his genital area. Again, advise patient to follow up with office regarding this concern/for appointment with PCP. Patient confirms he has the phone number for office and states that he will contact office if feels that he needs to. Remind patient to follow up with office for future medical concerns/medical device needs.  Follow Up Plan: Will outreach to patient by telephone as previously scheduled on 12/10/2021 at 8:30 am  Wallace Cullens, PharmD, [place] [facility] [phone]

## 2021-10-29 ENCOUNTER — Ambulatory Visit: Payer: Self-pay | Admitting: *Deleted

## 2021-10-29 DIAGNOSIS — B356 Tinea cruris: Secondary | ICD-10-CM

## 2021-10-29 MED ORDER — FLUCONAZOLE 200 MG PO TABS: 200.0000 mg | ORAL_TABLET | ORAL | 0 refills | Status: DC

## 2021-10-29 NOTE — Telephone Encounter (Signed)
Patient notified

## 2021-10-29 NOTE — Telephone Encounter (Signed)
Voicemail left again.

## 2021-10-29 NOTE — Telephone Encounter (Signed)
Reason for Disposition  Localized rash present > 7 days  Answer Assessment - Initial Assessment Questions 1. APPEARANCE of RASH: "Describe the rash."      I have a rash in my groin.   I can't keep anything to stop rubbing it.   I have Neosporin.   I'm using.   The rash is better. I'm using pads to protect the rash that women use.    I need something to help it because the rubbing is irritated. I fell and hurt my back and that's bothering. 2. LOCATION: "Where is the rash located?"      Bottom of the head of my penis.  It's also on the side of my penis where.  3. NUMBER: "How many spots are there?"      Not asked 4. SIZE: "How big are the spots?" (Inches, centimeters or compare to size of a coin)      Not asked 5. ONSET: "When did the rash start?"      2 weeks.   It gets better and the rubbing irritates it and the rash comes. 6. ITCHING: "Does the rash itch?" If Yes, ask: "How bad is the itch?"  (Scale 0-10; or none, mild, moderate, severe)     Not asked 7. PAIN: "Does the rash hurt?" If Yes, ask: "How bad is the pain?"  (Scale 0-10; or none, mild, moderate, severe)    - NONE (0): no pain    - MILD (1-3): doesn't interfere with normal activities     - MODERATE (4-7): interferes with normal activities or awakens from sleep     - SEVERE (8-10): excruciating pain, unable to do any normal activities     bothering 8. OTHER SYMPTOMS: "Do you have any other symptoms?" (e.g., fever)     No 9. PREGNANCY: "Is there any chance you are pregnant?" "When was your last menstrual period?"     N/A  Protocols used: Rash or Redness - Localized-A-AH  Chief Complaint: Rash on side of penis not healing with Neosporin.   Refusing to make an appt.   "Just wants something called in".   I let him know he needed to be seen to know what kind of rash it is to know what to order.   He asked me why I could not get him something.   I let him know I could not prescribe medications that he would need to see the dr.   He  still did not want an appt. Symptoms: irritation on side of penis Frequency: for last couple of weeks Pertinent Negatives: Patient denies N/A Disposition: [] ED /[] Urgent Care (no appt availability in office) / [] Appointment(In office/virtual)/ []  [place] [facility]/ [] Home Care/ [] Refused Recommended Disposition /[]  Mobile Bus/ [x]  Follow-up with PCP Additional Notes: Pt requesting a call for Dr. Parks Ranger to call him in something for the rash.   Message sent to Dr. Parks Ranger.   Call came in while office closed for lunch.  Also mentioned he fell and hurt his back "but it is pretty much ok now"

## 2021-10-29 NOTE — Telephone Encounter (Signed)
I will go ahead and order Fluconazole 200mg  one pill weekly for 4 weeks, or 4 pills.  He has been treated for rash in the past may be similar.  Any further treatment or advice would require office visit  [name], [facility] 10/29/2021, 4:40 PM

## 2021-10-29 NOTE — Telephone Encounter (Signed)
Left voicemail for pt to return call.

## 2021-11-03 ENCOUNTER — Ambulatory Visit: Payer: PPO | Admitting: Family Medicine

## 2021-11-03 ENCOUNTER — Encounter: Payer: Self-pay | Admitting: Family Medicine

## 2021-11-04 ENCOUNTER — Ambulatory Visit: Payer: Self-pay | Admitting: *Deleted

## 2021-11-04 NOTE — Telephone Encounter (Signed)
  Chief Complaint: pt's mother, not on DPR, called to say he is confused, she thinks he must have hit his head. Symptoms: acting out of character Frequency: for 10 days Pertinent Negatives: I did not speak to pt, the mother called, I made it clear I could not give any information. I was unable to reach pt to discuss. Disposition: [] ED /[] Urgent Care (no appt availability in office) / [] Appointment(In office/virtual)/ []  [place] [facility]/ [] Home Care/ [] Refused Recommended Disposition /[] [place] Mobile Bus/ []  Follow-up with PCP Additional Notes: Pt did not come to appt yesterday, has appt in two weeks. The mother thinks he either hit his head or his glucose is low and he is acting out of character.Unable to reach pt.   Answer Assessment - Initial Assessment Questions 1. LEVEL OF CONSCIOUSNESS: "How is he (she, the patient) acting right now?" (e.g., alert-oriented, confused, lethargic, stuporous, comatose)     Confused, out of character, talking ugly to his mother which is unusual 2. ONSET: "When did the confusion start?"  (minutes, hours, days)     10/25/21 he fell the mother believe 3. PATTERN "Does this come and go, or has it been constant since it started?"  "Is it present now?"     constant 4. ALCOHOL or DRUGS: "Has he been drinking alcohol or taking any drugs?"      no 5. NARCOTIC MEDICINES: "Has he been receiving any narcotic medications?" (e.g., morphine, Vicodin)     no 6. CAUSE: "What do you think is causing the confusion?"      Question if fell or if blood sugar too low 7. OTHER SYMPTOMS: "Are there any other symptoms?" (e.g., difficulty breathing, headache, fever, weakness)     no  Protocols used: Confusion - Delirium-A-AH

## 2021-11-06 ENCOUNTER — Telehealth: Payer: Self-pay | Admitting: Family Medicine

## 2021-11-06 NOTE — Telephone Encounter (Signed)
I left a message for the patient to call back so I can get information about what medication he is referring to.

## 2021-11-06 NOTE — Telephone Encounter (Signed)
Pt  called requesting pain  cream did not give the name of the medication he was requesting   he fell did not say when/where  he  fell at

## 2021-11-10 ENCOUNTER — Encounter: Payer: Self-pay | Admitting: Family Medicine

## 2021-11-10 ENCOUNTER — Encounter: Payer: Self-pay | Admitting: Emergency Medicine

## 2021-11-10 ENCOUNTER — Other Ambulatory Visit: Payer: Self-pay

## 2021-11-10 ENCOUNTER — Emergency Department: Payer: PPO

## 2021-11-10 ENCOUNTER — Ambulatory Visit (INDEPENDENT_AMBULATORY_CARE_PROVIDER_SITE_OTHER): Payer: PPO | Admitting: Family Medicine

## 2021-11-10 ENCOUNTER — Inpatient Hospital Stay: Payer: PPO

## 2021-11-10 ENCOUNTER — Inpatient Hospital Stay
Admission: EM | Admit: 2021-11-10 | Discharge: 2021-11-14 | DRG: 683 | Discharge status: Against Medical Advice | Payer: PPO | Source: Ambulatory Visit | Attending: Internal Medicine | Admitting: Internal Medicine

## 2021-11-10 ENCOUNTER — Ambulatory Visit: Payer: Self-pay

## 2021-11-10 ENCOUNTER — Ambulatory Visit: Payer: Self-pay | Admitting: *Deleted

## 2021-11-10 VITALS — BP 143/92 | HR 79 | Ht 71.0 in | Wt 339.0 lb

## 2021-11-10 DIAGNOSIS — Z6841 Body Mass Index (BMI) 40.0 and over, adult: Secondary | ICD-10-CM | POA: Diagnosis not present

## 2021-11-10 DIAGNOSIS — N184 Chronic kidney disease, stage 4 (severe): Secondary | ICD-10-CM

## 2021-11-10 DIAGNOSIS — Z1152 Encounter for screening for COVID-19: Secondary | ICD-10-CM | POA: Diagnosis not present

## 2021-11-10 DIAGNOSIS — Z7982 Long term (current) use of aspirin: Secondary | ICD-10-CM

## 2021-11-10 DIAGNOSIS — M545 Low back pain, unspecified: Secondary | ICD-10-CM | POA: Diagnosis present

## 2021-11-10 DIAGNOSIS — N186 End stage renal disease: Secondary | ICD-10-CM | POA: Diagnosis not present

## 2021-11-10 DIAGNOSIS — E1122 Type 2 diabetes mellitus with diabetic chronic kidney disease: Secondary | ICD-10-CM | POA: Diagnosis present

## 2021-11-10 DIAGNOSIS — I110 Hypertensive heart disease with heart failure: Secondary | ICD-10-CM | POA: Diagnosis not present

## 2021-11-10 DIAGNOSIS — J939 Pneumothorax, unspecified: Secondary | ICD-10-CM | POA: Diagnosis not present

## 2021-11-10 DIAGNOSIS — Z5320 Procedure and treatment not carried out because of patient's decision for unspecified reasons: Secondary | ICD-10-CM | POA: Diagnosis not present

## 2021-11-10 DIAGNOSIS — Z794 Long term (current) use of insulin: Secondary | ICD-10-CM | POA: Diagnosis not present

## 2021-11-10 DIAGNOSIS — N179 Acute kidney failure, unspecified: Secondary | ICD-10-CM

## 2021-11-10 DIAGNOSIS — E878 Other disorders of electrolyte and fluid balance, not elsewhere classified: Secondary | ICD-10-CM | POA: Diagnosis present

## 2021-11-10 DIAGNOSIS — E871 Hypo-osmolality and hyponatremia: Secondary | ICD-10-CM

## 2021-11-10 DIAGNOSIS — I129 Hypertensive chronic kidney disease with stage 1 through stage 4 chronic kidney disease, or unspecified chronic kidney disease: Secondary | ICD-10-CM | POA: Diagnosis present

## 2021-11-10 DIAGNOSIS — L89311 Pressure ulcer of right buttock, stage 1: Secondary | ICD-10-CM | POA: Diagnosis not present

## 2021-11-10 DIAGNOSIS — J45909 Unspecified asthma, uncomplicated: Secondary | ICD-10-CM | POA: Diagnosis present

## 2021-11-10 DIAGNOSIS — Z833 Family history of diabetes mellitus: Secondary | ICD-10-CM

## 2021-11-10 DIAGNOSIS — E785 Hyperlipidemia, unspecified: Secondary | ICD-10-CM | POA: Diagnosis not present

## 2021-11-10 DIAGNOSIS — R0989 Other specified symptoms and signs involving the circulatory and respiratory systems: Secondary | ICD-10-CM | POA: Diagnosis not present

## 2021-11-10 DIAGNOSIS — I132 Hypertensive heart and chronic kidney disease with heart failure and with stage 5 chronic kidney disease, or end stage renal disease: Secondary | ICD-10-CM | POA: Diagnosis present

## 2021-11-10 DIAGNOSIS — L8990 Pressure ulcer of unspecified site, unspecified stage: Secondary | ICD-10-CM | POA: Insufficient documentation

## 2021-11-10 DIAGNOSIS — X58XXXA Exposure to other specified factors, initial encounter: Secondary | ICD-10-CM | POA: Diagnosis present

## 2021-11-10 DIAGNOSIS — Z811 Family history of alcohol abuse and dependence: Secondary | ICD-10-CM

## 2021-11-10 DIAGNOSIS — N2581 Secondary hyperparathyroidism of renal origin: Secondary | ICD-10-CM | POA: Diagnosis present

## 2021-11-10 DIAGNOSIS — E1169 Type 2 diabetes mellitus with other specified complication: Secondary | ICD-10-CM | POA: Diagnosis not present

## 2021-11-10 DIAGNOSIS — S40811A Abrasion of right upper arm, initial encounter: Secondary | ICD-10-CM | POA: Diagnosis present

## 2021-11-10 DIAGNOSIS — Z720 Tobacco use: Secondary | ICD-10-CM | POA: Diagnosis present

## 2021-11-10 DIAGNOSIS — E8770 Fluid overload, unspecified: Secondary | ICD-10-CM | POA: Diagnosis present

## 2021-11-10 DIAGNOSIS — N189 Chronic kidney disease, unspecified: Secondary | ICD-10-CM | POA: Diagnosis not present

## 2021-11-10 DIAGNOSIS — Z7985 Long-term (current) use of injectable non-insulin antidiabetic drugs: Secondary | ICD-10-CM

## 2021-11-10 DIAGNOSIS — I509 Heart failure, unspecified: Secondary | ICD-10-CM | POA: Diagnosis not present

## 2021-11-10 DIAGNOSIS — E860 Dehydration: Secondary | ICD-10-CM | POA: Diagnosis present

## 2021-11-10 DIAGNOSIS — R4182 Altered mental status, unspecified: Secondary | ICD-10-CM | POA: Diagnosis not present

## 2021-11-10 DIAGNOSIS — D631 Anemia in chronic kidney disease: Secondary | ICD-10-CM | POA: Diagnosis present

## 2021-11-10 DIAGNOSIS — R0602 Shortness of breath: Secondary | ICD-10-CM | POA: Diagnosis not present

## 2021-11-10 DIAGNOSIS — R809 Proteinuria, unspecified: Secondary | ICD-10-CM | POA: Diagnosis present

## 2021-11-10 DIAGNOSIS — R197 Diarrhea, unspecified: Secondary | ICD-10-CM | POA: Diagnosis not present

## 2021-11-10 DIAGNOSIS — N170 Acute kidney failure with tubular necrosis: Principal | ICD-10-CM | POA: Diagnosis present

## 2021-11-10 DIAGNOSIS — I10 Essential (primary) hypertension: Secondary | ICD-10-CM | POA: Diagnosis not present

## 2021-11-10 DIAGNOSIS — R799 Abnormal finding of blood chemistry, unspecified: Secondary | ICD-10-CM | POA: Diagnosis present

## 2021-11-10 DIAGNOSIS — K219 Gastro-esophageal reflux disease without esophagitis: Secondary | ICD-10-CM | POA: Diagnosis not present

## 2021-11-10 DIAGNOSIS — R195 Other fecal abnormalities: Secondary | ICD-10-CM | POA: Diagnosis present

## 2021-11-10 DIAGNOSIS — M109 Gout, unspecified: Secondary | ICD-10-CM | POA: Diagnosis present

## 2021-11-10 DIAGNOSIS — Z9181 History of falling: Secondary | ICD-10-CM

## 2021-11-10 DIAGNOSIS — I872 Venous insufficiency (chronic) (peripheral): Secondary | ICD-10-CM | POA: Diagnosis present

## 2021-11-10 DIAGNOSIS — I5020 Unspecified systolic (congestive) heart failure: Secondary | ICD-10-CM

## 2021-11-10 DIAGNOSIS — G894 Chronic pain syndrome: Secondary | ICD-10-CM | POA: Diagnosis present

## 2021-11-10 DIAGNOSIS — I959 Hypotension, unspecified: Secondary | ICD-10-CM | POA: Diagnosis not present

## 2021-11-10 DIAGNOSIS — Z79899 Other long term (current) drug therapy: Secondary | ICD-10-CM

## 2021-11-10 DIAGNOSIS — Z5189 Encounter for other specified aftercare: Secondary | ICD-10-CM

## 2021-11-10 DIAGNOSIS — R531 Weakness: Secondary | ICD-10-CM | POA: Diagnosis not present

## 2021-11-10 DIAGNOSIS — L89321 Pressure ulcer of left buttock, stage 1: Secondary | ICD-10-CM | POA: Diagnosis present

## 2021-11-10 DIAGNOSIS — I5022 Chronic systolic (congestive) heart failure: Secondary | ICD-10-CM | POA: Diagnosis present

## 2021-11-10 DIAGNOSIS — R7989 Other specified abnormal findings of blood chemistry: Secondary | ICD-10-CM | POA: Diagnosis present

## 2021-11-10 DIAGNOSIS — Z5329 Procedure and treatment not carried out because of patient's decision for other reasons: Secondary | ICD-10-CM | POA: Diagnosis not present

## 2021-11-10 DIAGNOSIS — E119 Type 2 diabetes mellitus without complications: Secondary | ICD-10-CM | POA: Diagnosis not present

## 2021-11-10 DIAGNOSIS — D696 Thrombocytopenia, unspecified: Secondary | ICD-10-CM

## 2021-11-10 DIAGNOSIS — E1129 Type 2 diabetes mellitus with other diabetic kidney complication: Secondary | ICD-10-CM | POA: Diagnosis present

## 2021-11-10 DIAGNOSIS — Z992 Dependence on renal dialysis: Secondary | ICD-10-CM | POA: Diagnosis not present

## 2021-11-10 DIAGNOSIS — M898X9 Other specified disorders of bone, unspecified site: Secondary | ICD-10-CM | POA: Diagnosis present

## 2021-11-10 DIAGNOSIS — I878 Other specified disorders of veins: Secondary | ICD-10-CM | POA: Diagnosis present

## 2021-11-10 DIAGNOSIS — J811 Chronic pulmonary edema: Secondary | ICD-10-CM | POA: Diagnosis not present

## 2021-11-10 DIAGNOSIS — Z4800 Encounter for change or removal of nonsurgical wound dressing: Secondary | ICD-10-CM

## 2021-11-10 DIAGNOSIS — S199XXA Unspecified injury of neck, initial encounter: Secondary | ICD-10-CM | POA: Diagnosis not present

## 2021-11-10 DIAGNOSIS — Z8249 Family history of ischemic heart disease and other diseases of the circulatory system: Secondary | ICD-10-CM

## 2021-11-10 DIAGNOSIS — Z83511 Family history of glaucoma: Secondary | ICD-10-CM

## 2021-11-10 LAB — CBC
HCT: 36.1 % — ABNORMAL LOW (ref 39.0–52.0)
HCT: 36.8 % — ABNORMAL LOW (ref 39.0–52.0)
Hemoglobin: 11.1 g/dL — ABNORMAL LOW (ref 13.0–17.0)
Hemoglobin: 10.9 g/dL — ABNORMAL LOW (ref 13.0–17.0)
MCH: 30.7 pg (ref 26.0–34.0)
MCH: 30.1 pg (ref 26.0–34.0)
MCHC: 30.7 g/dL (ref 30.0–36.0)
MCHC: 29.6 g/dL — ABNORMAL LOW (ref 30.0–36.0)
MCV: 99.7 fL (ref 80.0–100.0)
MCV: 101.7 fL — ABNORMAL HIGH (ref 80.0–100.0)
Platelets: 90 10*3/uL — ABNORMAL LOW (ref 150–400)
Platelets: 90 10*3/uL — ABNORMAL LOW (ref 150–400)
RBC: 3.62 MIL/uL — ABNORMAL LOW (ref 4.22–5.81)
RBC: 3.62 MIL/uL — ABNORMAL LOW (ref 4.22–5.81)
RDW: 23.9 % — ABNORMAL HIGH (ref 11.5–15.5)
RDW: 24.4 % — ABNORMAL HIGH (ref 11.5–15.5)
WBC: 4.4 10*3/uL (ref 4.0–10.5)
WBC: 4.4 10*3/uL (ref 4.0–10.5)
nRBC: 1.4 % — ABNORMAL HIGH (ref 0.0–0.2)
nRBC: 1.1 % — ABNORMAL HIGH (ref 0.0–0.2)

## 2021-11-10 LAB — CK: Total CK: 369 U/L (ref 49–397)

## 2021-11-10 LAB — ETHANOL: Alcohol, Ethyl (B): <10 mg/dL (ref <10)

## 2021-11-10 LAB — CBG MONITORING, ED: Glucose-Capillary: 114 mg/dL — ABNORMAL HIGH (ref 70–99)

## 2021-11-10 LAB — COMPREHENSIVE METABOLIC PANEL
ALT: 55 U/L — ABNORMAL HIGH (ref 0–44)
AST: 61 U/L — ABNORMAL HIGH (ref 15–41)
Albumin: 2.1 g/dL — ABNORMAL LOW (ref 3.5–5.0)
Alkaline Phosphatase: 468 U/L — ABNORMAL HIGH (ref 38–126)
Anion gap: 11 (ref 5–15)
BUN: 104 mg/dL — ABNORMAL HIGH (ref 6–20)
CO2: 29 mmol/L (ref 22–32)
Calcium: 7.9 mg/dL — ABNORMAL LOW (ref 8.9–10.3)
Chloride: 83 mmol/L — ABNORMAL LOW (ref 98–111)
Creatinine, Ser: 8.08 mg/dL — ABNORMAL HIGH (ref 0.61–1.24)
GFR, Estimated: 7 mL/min — ABNORMAL LOW (ref >60)
Glucose, Bld: 77 mg/dL (ref 70–99)
Potassium: 3.7 mmol/L (ref 3.5–5.1)
Sodium: 123 mmol/L — ABNORMAL LOW (ref 135–145)
Total Bilirubin: 3.4 mg/dL — ABNORMAL HIGH (ref 0.3–1.2)
Total Protein: 5.8 g/dL — ABNORMAL LOW (ref 6.5–8.1)

## 2021-11-10 LAB — LIPASE, BLOOD: Lipase: 97 U/L — ABNORMAL HIGH (ref 11–51)

## 2021-11-10 MED ORDER — ALBUTEROL SULFATE (2.5 MG/3ML) 0.083% IN NEBU: 2.5000 mg | INHALATION_SOLUTION | Freq: Four times a day (QID) | RESPIRATORY_TRACT | Status: DC | PRN

## 2021-11-10 MED ORDER — SODIUM CHLORIDE 0.9 % IV BOLUS
1000.0000 mL | Freq: Once | INTRAVENOUS | Status: AC
  Administered 2021-11-10: 1000 mL via INTRAVENOUS

## 2021-11-10 MED ORDER — CARVEDILOL 25 MG PO TABS: 50.0000 mg | ORAL_TABLET | Freq: Two times a day (BID) | ORAL | Status: DC

## 2021-11-10 MED ORDER — ACETAMINOPHEN 650 MG RE SUPP: 650.0000 mg | Freq: Four times a day (QID) | RECTAL | Status: DC | PRN

## 2021-11-10 MED ORDER — PANTOPRAZOLE SODIUM 40 MG PO TBEC
80.0000 mg | DELAYED_RELEASE_TABLET | Freq: Two times a day (BID) | ORAL | Status: DC
  Administered 2021-11-11 – 2021-11-14 (×7): 80 mg via ORAL
  Filled 2021-11-10 (×8): qty 2

## 2021-11-10 MED ORDER — CALCITRIOL 0.25 MCG PO CAPS: 0.5000 ug | ORAL_CAPSULE | Freq: Every day | ORAL | Status: DC

## 2021-11-10 MED ORDER — INSULIN ASPART 100 UNIT/ML IJ SOLN: 0.0000 [IU] | Freq: Every day | INTRAMUSCULAR | Status: DC

## 2021-11-10 MED ORDER — ATORVASTATIN CALCIUM 20 MG PO TABS
20.0000 mg | ORAL_TABLET | Freq: Every day | ORAL | Status: DC
  Administered 2021-11-11 – 2021-11-14 (×4): 20 mg via ORAL
  Filled 2021-11-10 (×4): qty 1

## 2021-11-10 MED ORDER — TRAMADOL HCL 50 MG PO TABS
50.0000 mg | ORAL_TABLET | Freq: Two times a day (BID) | ORAL | Status: AC | PRN
  Administered 2021-11-11 – 2021-11-12 (×2): 50 mg via ORAL
  Filled 2021-11-10 (×3): qty 1

## 2021-11-10 MED ORDER — SENNOSIDES-DOCUSATE SODIUM 8.6-50 MG PO TABS: 1.0000 | ORAL_TABLET | Freq: Every evening | ORAL | Status: DC | PRN

## 2021-11-10 MED ORDER — INSULIN ASPART 100 UNIT/ML IJ SOLN
0.0000 [IU] | Freq: Three times a day (TID) | INTRAMUSCULAR | Status: DC
  Administered 2021-11-11 – 2021-11-13 (×3): 1 [IU] via SUBCUTANEOUS
  Administered 2021-11-14: 2 [IU] via SUBCUTANEOUS
  Filled 2021-11-10 (×4): qty 1

## 2021-11-10 MED ORDER — ONDANSETRON HCL 4 MG/2ML IJ SOLN: 4.0000 mg | Freq: Four times a day (QID) | INTRAMUSCULAR | Status: DC | PRN

## 2021-11-10 MED ORDER — HEPARIN SODIUM (PORCINE) 5000 UNIT/ML IJ SOLN
5000.0000 [IU] | Freq: Three times a day (TID) | INTRAMUSCULAR | Status: DC
  Administered 2021-11-10 – 2021-11-14 (×11): 5000 [IU] via SUBCUTANEOUS
  Filled 2021-11-10 (×12): qty 1

## 2021-11-10 MED ORDER — ASPIRIN 81 MG PO CHEW
81.0000 mg | CHEWABLE_TABLET | Freq: Every day | ORAL | Status: DC
  Administered 2021-11-11 – 2021-11-14 (×4): 81 mg via ORAL
  Filled 2021-11-10 (×4): qty 1

## 2021-11-10 MED ORDER — NICOTINE 21 MG/24HR TD PT24: 21.0000 mg | MEDICATED_PATCH | Freq: Every day | TRANSDERMAL | Status: DC | PRN

## 2021-11-10 MED ORDER — ONDANSETRON HCL 4 MG PO TABS: 4.0000 mg | ORAL_TABLET | Freq: Four times a day (QID) | ORAL | Status: DC | PRN

## 2021-11-10 MED ORDER — ACETAMINOPHEN 325 MG PO TABS
650.0000 mg | ORAL_TABLET | Freq: Four times a day (QID) | ORAL | Status: DC | PRN
  Administered 2021-11-11: 650 mg via ORAL
  Filled 2021-11-10 (×2): qty 2

## 2021-11-10 MED ORDER — SODIUM CHLORIDE 0.9 % IV SOLN: INTRAVENOUS | Status: AC

## 2021-11-10 MED ORDER — MONTELUKAST SODIUM 10 MG PO TABS: 10.0000 mg | ORAL_TABLET | Freq: Every day | ORAL | Status: DC

## 2021-11-10 NOTE — ED Notes (Signed)
Pt states that he fell 6 days ago. Pt states that he was not seen after falling.  Pt very sleepy in triage, falling asleep between questions. Pt slurring words

## 2021-11-10 NOTE — ED Notes (Signed)
Walked into room. Patient is on the bedside commode. Patient states he already voided and we would have to wait for the urine sample.

## 2021-11-10 NOTE — Progress Notes (Signed)
Subjective:    Patient ID: [name], male    DOB: 1965-05-11, 56 y.o.   MRN: [id number]  [name] is a 56 y.o. male presenting on 11/10/2021 for Dehydration, Diabetes, and Fall  Patient presents for a same day appointment.  HPI  Diarrhea Fall 2 weeks ago, Right Arm Abrasion with wound drainage Chills, shaking GERD flare Poor PO  Dehydration CKD IV  Reports worsening symptoms 2 weeks with diarrhea loose stools, unable to keep solids or liquids down, he tried ensure and other foods saltine crackers but unable to keep down.  Last CBG 90 today, he skipped insulin dose due to concern too low Fall 2 weeks ago injury on R arm using bandaid some drainge on the wound and soreness Difficulty getting up when fell 2 weeks ago, concern he was down for period of time.  Denied fever but admits shaking chills feeling cold  Recent Labs    03/03/21 0816 05/29/21 0923  HGBA1C 5.2 5.1      06/18/2021    1:44 PM 03/03/2021    8:11 AM 07/19/2020   10:38 AM  Depression screen PHQ 2/9  Decreased Interest 0 0 0  Down, Depressed, Hopeless 0 0 0  PHQ - 2 Score 0 0 0  Altered sleeping 0 0 0  Tired, decreased energy 0 0 1  Change in appetite 0 0 0  Feeling bad or failure about yourself  0 0 0  Trouble concentrating 0 0 0  Moving slowly or fidgety/restless 0 0 0  Suicidal thoughts 0 0 0  PHQ-9 Score 0 0 1  Difficult doing work/chores Not difficult at all Not difficult at all Not difficult at all    Social History   Tobacco Use   Smoking status: Never   Smokeless tobacco: Never  Substance Use Topics   Alcohol use: Yes    Comment: a glass of wine and a couple beers a day   Drug use: No    Review of Systems Per HPI unless specifically indicated above     Objective:    BP (!) 143/92   Pulse 79   Ht 5\' 11"  (1.803 m)   Wt (!) 339 lb (153.8 kg)   SpO2 98%   BMI 47.28 kg/m   Wt Readings from Last 3 Encounters:  11/10/21 (!) 339 lb (153.8 kg)  06/18/21 (!) 339  lb (153.8 kg)  03/03/21 (!) 342 lb (155.1 kg)    Physical Exam Vitals and nursing note reviewed.  Constitutional:      General: He is not in acute distress.    Appearance: He is well-developed. He is obese. He is not diaphoretic.     Comments: Chronically ill appearing, obese, uncomfortable with general discomfort and lower and upper extremity  HENT:     Head: Normocephalic and atraumatic.  Eyes:     General:        Right eye: No discharge.        Left eye: No discharge.     Conjunctiva/sclera: Conjunctivae normal.  Neck:     Thyroid: No thyromegaly.  Cardiovascular:     Rate and Rhythm: Normal rate and regular rhythm.     Pulses: Normal pulses.     Heart sounds: Normal heart sounds. No murmur heard. Pulmonary:     Effort: Pulmonary effort is normal. No respiratory distress.     Breath sounds: Normal breath sounds. No wheezing or rales.  Musculoskeletal:        General:  Normal range of motion.     Cervical back: Normal range of motion and neck supple.  Lymphadenopathy:     Cervical: No cervical adenopathy.  Skin:    General: Skin is warm and dry.     Findings: Lesion (right upper extremity inner arm elbow region with 6 cm skin tear abrasion some granulation tissue oozing present today. no extending erythema.) present. No erythema or rash.  Neurological:     Mental Status: He is alert and oriented to person, place, and time. Mental status is at baseline.  Psychiatric:        Behavior: Behavior normal.     Comments: Well groomed, good eye contact, normal speech and thoughts      Results for orders placed or performed in visit on 06/02/21  HM DIABETES EYE EXAM  Result Value Ref Range   HM Diabetic Eye Exam Retinopathy (A) No Retinopathy      Assessment & Plan:   Problem List Items Addressed This Visit     GERD (gastroesophageal reflux disease)   Type 2 diabetes mellitus, controlled, with renal complications ([place])   Other Visit Diagnoses     Diarrhea, unspecified  type    -  Primary   Abrasion of right upper extremity, initial encounter           Worsening PO intake with diarrhea loose stools, poor PO intake CBG 90 today Shaking chills  Recent fall injury abrasion R upper arm, with some drainage 2 weeks ago, unresolved Topical wound care but still oozing, no obvious cellulitis but may be source of sepsis  Vitals within range, he called EMS earlier and evaluated his vital signs  Now worsening shaking chills, cannot tolerate PO, but he is afebrile in office on temp.  Concern with sending patient home with oral antibiotics, and he has failed Imodium and other anti diarrhea therapy.  Will likely need IV therapy - rehydration if dehydration, concern about CKD, also with his fall and difficulty getting off ground, would be concerned for CK Level Rhabdo as well, and will need immediate lab testing  He agreed to go to ED for further evaluation. He is unable to drive by personal vehicle, EMS was called to office to take him to [facility] ED and patient agrees with plan.   No orders of the defined types were placed in this encounter.     Follow up plan: Return if symptoms worsen or fail to improve.  [name], [facility] 11/10/2021, 11:49 AM

## 2021-11-10 NOTE — Assessment & Plan Note (Addendum)
Continue atorvastatin

## 2021-11-10 NOTE — ED Provider Notes (Incomplete)
[facility] Provider Note    Event Date/Time   First MD Initiated Contact with Patient 11/10/21 1940     (approximate)   History   Diarrhea and Wound Check   HPI  [name] is a 56 y.o. male who reports for the last week he has been having stools every time he eats anything.  He is a little weak.  Somewhat lightheaded.  He also has a cough.  He has a scratch on his arm that he is worried about.  His doctor sent him here to get checked to see if he needs antibiotics.      Physical Exam   Triage Vital Signs: ED Triage Vitals  Enc Vitals Group     BP 11/10/21 1354 119/77     Pulse Rate 11/10/21 1354 84     Resp 11/10/21 1354 16     Temp 11/10/21 1356 (!) 97.2 F (36.2 C)     Temp Source 11/10/21 1354 Oral     SpO2 11/10/21 1354 94 %     Weight --      Height --      Head Circumference --      Peak Flow --      Pain Score 11/10/21 1355 0     Pain Loc --      Pain Edu? --      Excl. in [place]? --     Most recent vital signs: Vitals:   11/10/21 1354 11/10/21 1356  BP: 119/77   Pulse: 84   Resp: 16   Temp:  (!) 97.2 F (36.2 C)  SpO2: 94%      General: Awake, no distress.  CV:  Good peripheral perfusion.  Heart regular rate and rhythm no audible murmur Resp:  Normal effort.  Lungs are clear Abd:  No distention.  Soft and nontender    ED Results / Procedures / Treatments   Labs (all labs ordered are listed, but only abnormal results are displayed) Labs Reviewed  LIPASE, BLOOD - Abnormal; Notable for the following components:      Result Value   Lipase 97 (*)    All other components within normal limits  COMPREHENSIVE METABOLIC PANEL - Abnormal; Notable for the following components:   Sodium 123 (*)    Chloride 83 (*)    BUN 104 (*)    Creatinine, Ser 8.08 (*)    Calcium 7.9 (*)    Total Protein 5.8 (*)    Albumin 2.1 (*)    AST 61 (*)    ALT 55 (*)    Alkaline Phosphatase 468 (*)    Total Bilirubin 3.4 (*)    GFR,  Estimated 7 (*)    All other components within normal limits  CBC - Abnormal; Notable for the following components:   RBC 3.62 (*)    Hemoglobin 11.1 (*)    HCT 36.1 (*)    RDW 23.9 (*)    Platelets 90 (*)    nRBC 1.4 (*)    All other components within normal limits  GASTROINTESTINAL PANEL BY PCR, STOOL (REPLACES STOOL CULTURE)  C DIFFICILE QUICK SCREEN W PCR REFLEX    ETHANOL  CK  URINALYSIS, ROUTINE W REFLEX MICROSCOPIC  URINE DRUG SCREEN, QUALITATIVE ([facility] ONLY)     EKG     RADIOLOGY Chest x-ray read by radiology reviewed by me shows a large heart with apparent CHF/pulmonary edema CT of the head and neck read by radiology reviewed  and interpreted by me shows only sinusitis.  PROCEDURES:  Critical Care performed:   Procedures   MEDICATIONS ORDERED IN ED: Medications  sodium chloride 0.9 % bolus 1,000 mL (has no administration in time range)     IMPRESSION / MDM / ASSESSMENT AND PLAN / ED COURSE  I reviewed the triage vital signs and the nursing notes.                              Differential diagnosis includes, but is not limited to, ***  Patient's presentation is most consistent with {EM COPA:27473}  {If the patient is on the monitor, remove the brackets and asterisks on the sentence below and remember to document it as a Procedure as well. Otherwise delete the sentence below:1} {**The patient is on the cardiac monitor to evaluate for evidence of arrhythmia and/or significant heart rate changes.**} {Remember to include, when applicable, any/all of the following data: independent review of imaging independent review of labs (comment specifically on pertinent positives and negatives) review of specific prior hospitalizations, PCP/specialist notes, etc. discuss meds given and prescribed document any discussion with consultants (including hospitalists) any clinical decision tools you used and why (PECARN, NEXUS, etc.) did you consider admitting the  patient? document social determinants of health affecting patient's care (homelessness, inability to follow up in a timely fashion, etc) document any pre-existing conditions increasing risk on current visit (e.g. diabetes and HTN increasing danger of high-risk chest pain/ACS) describes what meds you gave (especially parenteral) and why any other interventions?:1}     FINAL CLINICAL IMPRESSION(S) / ED DIAGNOSES   Final diagnoses:  None     Rx / DC Orders   ED Discharge Orders     None        Note:  This document was prepared using Dragon voice recognition software and may include unintentional dictation errors.

## 2021-11-10 NOTE — Assessment & Plan Note (Signed)
Stool studies negative.  Advance diet.

## 2021-11-10 NOTE — Assessment & Plan Note (Addendum)
Tramadol is not a good medication with acute kidney injury.  Careful with this medication.

## 2021-11-10 NOTE — Assessment & Plan Note (Signed)
PPI ?

## 2021-11-10 NOTE — ED Notes (Signed)
Pt on the phone with family member ordering food.  Pt states he hasnt eaten in 7 days and he becomes unable to do anything if he doesn't eat.  Pt advised not to eat until after he sees provider.  Pt refusing and states he needs to eat something.

## 2021-11-10 NOTE — Assessment & Plan Note (Signed)
-   PRN nicotine patch

## 2021-11-10 NOTE — Assessment & Plan Note (Addendum)
-   Patient has BMI of 47.29 with current height and weight in computer.

## 2021-11-10 NOTE — Assessment & Plan Note (Addendum)
With hyponatremia and hypochloremia - Ultrasound of the kidneys have been ordered - Presumed secondary to prerenal in setting of GI loss - Status post sodium chloride 1 L bolus - Sodium chloride 100 mL/h, 5 hours ordered - Check BMP in a.m.

## 2021-11-10 NOTE — Assessment & Plan Note (Addendum)
Watch closely with IV fluids.

## 2021-11-10 NOTE — Telephone Encounter (Signed)
Attempted to call FC at [facility], no answer. Teams messaged Rachell and Caryl Pina to give a "head up". Received message for Caryl Pina that they are already aware of pt situations.

## 2021-11-10 NOTE — Telephone Encounter (Signed)
  Chief Complaint: mother is calling- concerned about patient welfare- patient has multiple health concerns- weight gain, swelling, uncontrolled diabetes, and states he wants to kill himself Symptoms: see above Frequency: ongoing several days Pertinent Negatives: Patient denies   Disposition: [] ED /[] Urgent Care (no appt availability in office) / [] Appointment(In office/virtual)/ []  [facility]/ [] Home Care/ [] Refused Recommended Disposition /[] [place] [facility]/ []  Follow-up with PCP Additional Notes: Attempted to call patient- no answer- left message to call office. Call 911 for welfare check on patient.

## 2021-11-10 NOTE — ED Provider Triage Note (Signed)
Emergency Medicine Provider Triage Evaluation Note  [name] , a 56 y.o. male  was evaluated in triage.  Pt complains of diarrhea x 6 days and scrape on the right arm that has not started to heal.  Physical Exam  BP 119/77   Pulse 84   Resp 16   SpO2 94%  Gen:   Awake, no distress   Resp:  Normal effort  MSK:   Moves extremities without difficulty  Other:    Medical Decision Making  Medically screening exam initiated at 1:56 PM.  Appropriate orders placed.  [name] was informed that the remainder of the evaluation will be completed by another provider, this initial triage assessment does not replace that evaluation, and the importance of remaining in the ED until their evaluation is complete.  Note from PCP office reviewed. CT head, cervical spine, and additional labs added to protocols.   [name], [name] 11/10/21 1404

## 2021-11-10 NOTE — Assessment & Plan Note (Signed)
Holding Coreg

## 2021-11-10 NOTE — ED Provider Notes (Incomplete)
 [facility] Provider Note    Event Date/Time   First MD Initiated Contact with Patient 11/10/21 1940     (approximate)   History   Diarrhea and Wound Check   HPI  [name] is a 56 y.o. male who reports for the last week he has been having stools every time he eats anything.  He is a little weak.  Somewhat lightheaded.  He also has a cough.  He has a scratch on his arm that he is worried about.  His doctor sent him here to get checked to see if he needs antibiotics.      Physical Exam   Triage Vital Signs: ED Triage Vitals  Enc Vitals Group     BP 11/10/21 1354 119/77     Pulse Rate 11/10/21 1354 84     Resp 11/10/21 1354 16     Temp 11/10/21 1356 (!) 97.2 F (36.2 C)     Temp Source 11/10/21 1354 Oral     SpO2 11/10/21 1354 94 %     Weight --      Height --      Head Circumference --      Peak Flow --      Pain Score 11/10/21 1355 0     Pain Loc --      Pain Edu? --      Excl. in [place]? --     Most recent vital signs: Vitals:   11/10/21 1356 11/10/21 2301  BP:    Pulse:    Resp:    Temp: (!) 97.2 F (36.2 C) 97.6 F (36.4 C)  SpO2:       General: Awake, no distress.  CV:  Good peripheral perfusion.  Heart regular rate and rhythm no audible murmur Resp:  Normal effort.  Lungs scattered crackles bilaterally Abd:  No distention.  Soft and nontender Skin: Scratch on the undersurface of the right arm which shows superficial.  There is some discolored skin around it but there is no erythema or tenderness there is no firmness or edema.  It does not look like there is an infection currently in that area.   ED Results / Procedures / Treatments   Labs (all labs ordered are listed, but only abnormal results are displayed) Labs Reviewed  LIPASE, BLOOD - Abnormal; Notable for the following components:      Result Value   Lipase 97 (*)    All other components within normal limits  COMPREHENSIVE METABOLIC PANEL - Abnormal; Notable  for the following components:   Sodium 123 (*)    Chloride 83 (*)    BUN 104 (*)    Creatinine, Ser 8.08 (*)    Calcium 7.9 (*)    Total Protein 5.8 (*)    Albumin 2.1 (*)    AST 61 (*)    ALT 55 (*)    Alkaline Phosphatase 468 (*)    Total Bilirubin 3.4 (*)    GFR, Estimated 7 (*)    All other components within normal limits  CBC - Abnormal; Notable for the following components:   RBC 3.62 (*)    Hemoglobin 11.1 (*)    HCT 36.1 (*)    RDW 23.9 (*)    Platelets 90 (*)    nRBC 1.4 (*)    All other components within normal limits  URINALYSIS, ROUTINE W REFLEX MICROSCOPIC - Abnormal; Notable for the following components:   Color, Urine YELLOW (*)      APPearance CLEAR (*)    All other components within normal limits  BRAIN NATRIURETIC PEPTIDE - Abnormal; Notable for the following components:   B Natriuretic Peptide 1,223.0 (*)    All other components within normal limits  BASIC METABOLIC PANEL - Abnormal; Notable for the following components:   Sodium 125 (*)    Chloride 83 (*)    Glucose, Bld 102 (*)    BUN 101 (*)    Creatinine, Ser 8.22 (*)    Calcium 8.0 (*)    GFR, Estimated 7 (*)    All other components within normal limits  CBC - Abnormal; Notable for the following components:   RBC 3.62 (*)    Hemoglobin 10.9 (*)    HCT 36.8 (*)    MCV 101.7 (*)    MCHC 29.6 (*)    RDW 24.4 (*)    Platelets 90 (*)    nRBC 1.1 (*)    All other components within normal limits  AMMONIA - Abnormal; Notable for the following components:   Ammonia 52 (*)    All other components within normal limits  CBG MONITORING, ED - Abnormal; Notable for the following components:   Glucose-Capillary 114 (*)    All other components within normal limits  TROPONIN I (HIGH SENSITIVITY) - Abnormal; Notable for the following components:   Troponin I (High Sensitivity) 36 (*)    All other components within normal limits  SARS CORONAVIRUS 2 BY RT PCR  GASTROINTESTINAL PANEL BY PCR, STOOL (REPLACES  STOOL CULTURE)  C DIFFICILE QUICK SCREEN W PCR REFLEX    ETHANOL  URINE DRUG SCREEN, QUALITATIVE ([facility] ONLY)  CK  HIV ANTIBODY (ROUTINE TESTING W REFLEX)  TROPONIN I (HIGH SENSITIVITY)     EKG  Pending   RADIOLOGY Chest x-ray read by radiology reviewed by me shows a large heart with apparent CHF/pulmonary edema CT of the head and neck read by radiology reviewed and interpreted by me shows only sinusitis.  PROCEDURES:  Critical Care performed:   Procedures   MEDICATIONS ORDERED IN ED: Medications  acetaminophen (TYLENOL) tablet 650 mg (has no administration in time range)    Or  acetaminophen (TYLENOL) suppository 650 mg (has no administration in time range)  ondansetron (ZOFRAN) tablet 4 mg (has no administration in time range)    Or  ondansetron (ZOFRAN) injection 4 mg (has no administration in time range)  heparin injection 5,000 Units (5,000 Units Subcutaneous Given 11/10/21 2302)  senna-docusate (Senokot-S) tablet 1 tablet (has no administration in time range)  insulin aspart (novoLOG) injection 0-9 Units (has no administration in time range)  insulin aspart (novoLOG) injection 0-5 Units ( Subcutaneous Not Given 11/10/21 2342)  nicotine (NICODERM CQ - dosed in mg/24 hours) patch 21 mg (has no administration in time range)  aspirin chewable tablet 81 mg (has no administration in time range)  traMADol (ULTRAM) tablet 50 mg (has no administration in time range)  atorvastatin (LIPITOR) tablet 20 mg (has no administration in time range)  carvedilol (COREG) tablet 50 mg (has no administration in time range)  calcitRIOL (ROCALTROL) capsule 0.5 mcg (has no administration in time range)  pantoprazole (PROTONIX) EC tablet 80 mg (has no administration in time range)  albuterol (PROVENTIL) (2.5 MG/3ML) 0.083% nebulizer solution 2.5 mg (has no administration in time range)  montelukast (SINGULAIR) tablet 10 mg (has no administration in time range)  0.9 %  sodium chloride  infusion ( Intravenous New Bag/Given 11/10/21 2337)  sodium chloride 0.9 % bolus 1,000 mL (  0 mLs Intravenous Stopped 11/10/21 2255)     IMPRESSION / MDM / ASSESSMENT AND PLAN / ED COURSE  I reviewed the triage vital signs and the nursing notes. Patient with history of diarrhea who feels weak but further evaluation reveals hyponatremia AKI elevated LFTs and a chest x-ray with apparent CHF.  Stool specimen has been collected and will be in process has not returned yet.  We will admit the patient for further evaluation.  Differential diagnosis includes, but is not limited to, intra-abdominal process possibly obstructive with elevated alk phos.  Patient with AKI and hyponatremia probably from dehydration and the effects of diarrhea.  Chest x-ray however looks like CHF unsure why this is.  Patient has not had any chest pain no recent short of breath either.  He did have some crackles in his lungs on exam.  He will require further careful evaluation and treatment  Patient's presentation is most consistent with acute presentation with potential threat to life or bodily function.  The patient is on the cardiac monitor to evaluate for evidence of arrhythmia and/or significant heart rate changes.  None have been seen      FINAL CLINICAL IMPRESSION(S) / ED DIAGNOSES   Final diagnoses:  Diarrhea, unspecified type  Wound check, abscess  Systolic congestive heart failure, unspecified HF chronicity (HCC)  The wound check, abscess should actually just be wound check.   Rx / DC Orders   ED Discharge Orders     None        Note:  This document was prepared using Dragon voice recognition software and may include unintentional dictation errors.   ,  F, MD 11/11/21 0045    ,  F, MD 11/11/21 0045  

## 2021-11-10 NOTE — Assessment & Plan Note (Signed)
Holding long-acting insulin and using sliding scale only.

## 2021-11-10 NOTE — Telephone Encounter (Signed)
Message from Roslynn Amble sent at 11/10/2021  8:45 AM EDT  Summary: Abdominal cramping/ diarrhea   The patient called in stating he has had some abdominal discomfort and diarrhea for about a week and it is not getting better. He would like to talk to someone or have something called into his pharmacy to help. Please assist patient further.   [place], [place] - [place]  Phone: [phone]  Fax: [phone]          Chief Complaint: severe diarrhea Symptoms: watery diarrhea Frequency: 1 week Pertinent Negatives: Patient denies abd pain Disposition: [] ED /[] Urgent Care (no appt availability in office) / [x] Appointment(In office/virtual)/ []  [place] [facility] [place] [facility]/ [] Home Care/ [] Refused Recommended Disposition /[] [place] [facility]/ []  Follow-up with PCP Additional Notes: noted during call, a note written by another NT nurse Opal Sidles. Per note pt told his mother he wanted to kill himself. NT nurse called 911. During call EMS was interviewing pt. Made pt appt, but not sure if EMS will bring in to ED for evaluation. Overheard pt tell EMS he was not serious about killing himself. Asked pt if he wants to kill himself. Pt denied wanting to kill himself. Advised pt that his mother called out of concern for his wellbeing and that any complaints like that are taken very seriously. EMS still at residence when call ended.  Reason for Disposition  [1] SEVERE diarrhea (e.g., 7 or more times / day more than normal) AND [2] present > 24 hours (1 day)  Answer Assessment - Initial Assessment Questions 1. DIARRHEA SEVERITY: "How bad is the diarrhea?" "How many more stools have you had in the past 24 hours than normal?"    - NO DIARRHEA (SCALE 0)   - MILD (SCALE 1-3): Few loose or mushy BMs; increase of 1-3 stools over normal daily number of stools; mild increase in ostomy output.   -  MODERATE (SCALE 4-7): Increase of 4-6 stools daily over normal; moderate increase in ostomy  output.   -  SEVERE (SCALE 8-10; OR "WORST POSSIBLE"): Increase of 7 or more stools daily over normal; moderate increase in ostomy output; incontinence.     severe 2. ONSET: "When did the diarrhea begin?"      1 week  3. BM CONSISTENCY: "How loose or watery is the diarrhea?"      watery 4. VOMITING: "Are you also vomiting?" If Yes, ask: "How many times in the past 24 hours?"      no 5. ABDOMEN PAIN: "Are you having any abdomen pain?" If Yes, ask: "What does it feel like?" (e.g., crampy, dull, intermittent, constant)      none 6. ABDOMEN PAIN SEVERITY: If present, ask: "How bad is the pain?"  (e.g., Scale 1-10; mild, moderate, or severe)   - MILD (1-3): doesn't interfere with normal activities, abdomen soft and not tender to touch    - MODERATE (4-7): interferes with normal activities or awakens from sleep, abdomen tender to touch    - SEVERE (8-10): excruciating pain, doubled over, unable to do any normal activities       no 7. ORAL INTAKE: If vomiting, "Have you been able to drink liquids?" "How much liquids have you had in the past 24 hours?"     Yes-2 cups of water 8. HYDRATION: "Any signs of dehydration?" (e.g., dry mouth [not just dry lips], too weak to stand, dizziness, new weight loss) "When did you last urinate?"  No- 30 minutes 9. EXPOSURE: "Have you traveled to a foreign country recently?" "Have you been exposed to anyone with diarrhea?" "Could you have eaten any food that was spoiled?"     No- no-no 10. ANTIBIOTIC USE: "Are you taking antibiotics now or have you taken antibiotics in the past 2 months?"       no 11. OTHER SYMPTOMS: "Do you have any other symptoms?" (e.g., fever, blood in stool)       Chills comes and goes 12. PREGNANCY: "Is there any chance you are pregnant?" "When was your last menstrual period?"       N/a  Protocols used: [facility]

## 2021-11-10 NOTE — H&P (Signed)
History and Physical   [name] PJK:[id number] DOB: 1965/09/11 DOA: 11/10/2021  PCP: Darren Hauser, DO  Patient coming from: Home  I have personally briefly reviewed patient's old medical records in [place].  Chief Concern: Diarrhea, abnormal labs  HPI: Mr. [name] is a 56 year old male morbid obesity, hyperlipidemia, hypertension, insulin-dependent diabetes mellitus, GERD, who presents emergency department for chief concerns of abnormal labs.  Initial vitals in the emergency department showed temperature of 97.2, respiration rate of 16, heart rate of 84, blood pressure 119/77, SPO2 of 94% on room air.  Serum sodium is 123, potassium 3.7, chloride 83, bicarb 29, BUN of 104, serum creatinine of 8.08, nonfasting blood glucose 77, WBC 4.4, hemoglobin 11.1, platelets of 90.  CK 369.  ED treatment: Sodium chloride 1 L bolus -------------- At bedside patient was sleeping and snoring.  Difficult to arouse.  However I was able to arouse him.  He was able to tell me his full name, his age is 56, and the current calendar year 2023.  He knows he is in the hospital.  He states he is in the emergency department because he had abnormal kidney levels and diarrhea.  He reports the diarrhea has been ongoing for 2 weeks.  He denies current nausea and vomiting.  However he states that over the past couple weeks he has been having difficulty keeping food down.  He denies any syncope, loss of consciousness, chest pain, shortness of breath, abdominal pain, dysuria, hematuria.  He reports he is still urinating and he last urinated prior to coming to the ED.  ROS: Constitutional: no weight change, no fever ENT/Mouth: no sore throat, no rhinorrhea Eyes: no eye pain, no vision changes Cardiovascular: no chest pain, no dyspnea,  + edema, no palpitations Respiratory: no cough, no sputum, no wheezing Gastrointestinal: + nausea, no vomiting, + diarrhea, no  constipation Genitourinary: no urinary incontinence, no dysuria, no hematuria Musculoskeletal: no arthralgias, no myalgias Skin: no skin lesions, no pruritus, Neuro: + weakness, no loss of consciousness, no syncope Psych: no anxiety, no depression, + decrease appetite Heme/Lymph: no bruising, no bleeding  ED Course: Discussed with emergency medicine provider, patient requiring hospitalization for chief concerns of volume overload and acute kidney injury.  Assessment/Plan  Principal Problem:   AKI (acute kidney injury) ([place]) Active Problems:   Morbid obesity with BMI of 45.0-49.9, adult (HCC)   Benign hypertension with CKD (chronic kidney disease) stage IV (HCC)   Proteinuria   Type 2 diabetes mellitus, controlled, with renal complications (HCC)   Secondary renal hyperparathyroidism (HCC)   Tobacco abuse, episodic   Hyperlipidemia associated with type 2 diabetes mellitus (HCC)   Current tobacco use   GERD (gastroesophageal reflux disease)   Chronic pain syndrome   Diarrhea   Volume overload   Assessment and Plan:  * AKI (acute kidney injury) ([place]) With hyponatremia and hypochloremia - Ultrasound of the kidneys have been ordered - Presumed secondary to prerenal in setting of GI loss - Status post sodium chloride 1 L bolus - Sodium chloride 100 mL/h, 5 hours ordered - Check BMP in a.m.  Volume overload - Presumed secondary to acute kidney injury - Patient denies chest pain and shortness of breath - Treat AKI - Would recommend a.m. team to consider complete echo if appropriate in the a.m.  Diarrhea - Patient reports 2 episodes of brown/yellow stool, watery on day of admission - Increase diarrhea over the last few days - Check GI panel and  C. difficile - Low clinical suspicion for C. difficile colitis/infectious area - Query gastroenteritis - Status post sodium chloride 1 L bolus  Chronic pain syndrome - Checked PMD aware - Patient is on tramadol 50-100 mg every 12  hours as needed - I ordered tramadol 50 mg every 12 hours as needed for moderate pain given acute kidney injury  GERD (gastroesophageal reflux disease) - PPI  Current tobacco use - PRN nicotine patch  Hyperlipidemia associated with type 2 diabetes mellitus (HCC) - Atorvastatin 20 mg daily resumed  Type 2 diabetes mellitus, controlled, with renal complications (HCC) - I am holding home insulin glargine 10 units due to acute kidney injury - Insulin SSI with at bedtime coverage, renal dosing - AM team to resume home insulin when appropriate  Benign hypertension with CKD (chronic kidney disease) stage IV (HCC) - Carvedilol 50 mg p.o. twice daily resumed  Morbid obesity with BMI of 45.0-49.9, adult ([place]) - Patient has BMI of 47.28. This complicates overall care and prognosis.   Chart reviewed.   DVT prophylaxis: Heparin 5000 units subcutaneous every 8 hours Code Status: Full code Diet: Renal Family Communication: Attempted to call mother at the listed number [phone], I received an automated message stating that 'the number you are calling is not excepting your call' Disposition Plan: Pending clinical course Consults called:  Admission status: Inpatient, telemetry cardiac  Past Medical History:  Diagnosis Date   Allergy    Asthma    Gout    Hypertension    Morbid obesity ([place])    History reviewed. No pertinent surgical history.  Social History:  reports that he has never smoked. He has never used smokeless tobacco. He reports current alcohol use. He reports that he does not use drugs.  Allergies  Allergen Reactions   Talc    Family History  Problem Relation Age of Onset   Diabetes Mother    Hypertension Mother    Alcohol abuse Father    Glaucoma Father    Diabetes Father    Hypertension Father    Cancer Neg Hx    COPD Neg Hx    Heart disease Neg Hx    Stroke Neg Hx    Family history: Family history reviewed and not pertinent  Prior to Admission  medications   Medication Sig Start Date End Date Taking? Authorizing Provider  albuterol (PROVENTIL) (2.5 MG/3ML) 0.083% nebulizer solution Take 3 mLs (2.5 mg total) by nebulization every 6 (six) hours as needed for wheezing or shortness of breath. 02/21/20   [name], [name], DO  albuterol (VENTOLIN HFA) 108 (90 Base) MCG/ACT inhaler Inhale 2 puffs into the lungs every 4 (four) hours as needed for wheezing or shortness of breath. 07/19/20   [name], [name], DO  allopurinol (ZYLOPRIM) 100 MG tablet Take 2 tablets (200 mg total) by mouth daily. 10/30/20   [name], [name], DO  aspirin 81 MG chewable tablet Chew 81 mg by mouth daily. 02/15/15   [provider]  atorvastatin (LIPITOR) 20 MG tablet Take 1 tablet (20 mg total) by mouth daily. 10/30/20   [name], [name], DO  blood glucose meter kit and supplies KIT Brand of choice; LON 99 months; check FSBS 3x a day; E11.65; disp one meter with 100 strips + 1 refill of strips 01/17/15   [name], [name], MD  Blood Glucose Monitoring Suppl (ONE TOUCH ULTRA 2) w/Device KIT Use to check blood glucose up to 3 x daily 04/03/20   Darren Hauser, DO  bumetanide (BUMEX) 1 MG tablet Take by mouth. Take 2 tablets (2 mg total) by mouth in the morning and 2 tablets (2 mg total) at noon and 2 tablets (2 mg total) in the evening. 02/27/20 02/26/21  [provider]  calcitRIOL (ROCALTROL) 0.5 MCG capsule Take 2 capsules (1 mcg total) by mouth 1 (one) time each day    [provider]  carvedilol (COREG) 25 MG tablet Take 2 tablets (50 mg total) by mouth 2 (two) times daily with a meal. 04/09/21   [name], [name], DO  Dulaglutide (TRULICITY) 6.22 QJ/3.3LK SOPN Inject 0.75 mg into the skin once a week.    [provider]  esomeprazole (NEXIUM) 40 MG capsule Take 1 capsule (40 mg total) by mouth 2 (two) times daily before a meal. 10/08/21   [name], Alexander J, DO  fluticasone (FLONASE) 50 MCG/ACT  nasal spray Place 2 sprays into both nostrils daily. Use for 4-6 weeks then stop and use seasonally or as needed. 07/15/20   [name], [name], DO  Insulin Glargine (BASAGLAR KWIKPEN) 100 UNIT/ML SOPN Inject 10 Units into the skin daily. Submitted to [employer] for assistance 01/11/18   Darren Hauser, DO  Insulin Pen Needle (B-D UF III MINI PEN NEEDLES) 31G X 5 MM MISC 1 each by Does not apply route daily. Use once daily with Victoza. 03/11/16   [name], [name], DO  loratadine (CLARITIN) 10 MG tablet Take 1 tablet (10 mg total) by mouth daily. Patient taking differently: Take 10 mg by mouth every other day. As needed 01/04/20   Darren Hauser, DO  metolazone (ZAROXOLYN) 5 MG tablet Take 1 tablet (5 mg total) by mouth 1 (one) time each day if needed (for weight gain)    [provider]  montelukast (SINGULAIR) 10 MG tablet Take 1 tablet (10 mg total) by mouth at bedtime. 10/30/20   [name], [name], DO  OneTouch Delica Lancets 56Y MISC Use to check blood sugar 3 x daily 04/03/20   Parks Ranger, [name], DO  [facility] ULTRA test strip Check blood sugar 3 x daily 04/03/20   Darren Hauser, DO  potassium chloride (KLOR-CON) 10 MEQ tablet Take 10 mEq by mouth daily. 06/09/21   [provider]  simethicone (MYLICON) 563 MG chewable tablet Chew 125 mg by mouth every 6 (six) hours as needed for flatulence.    [provider]  traMADol (ULTRAM) 50 MG tablet Take 1-2 tablets (50-100 mg total) by mouth every 12 (twelve) hours as needed. 06/18/21   [name], [name], DO  vitamin C (ASCORBIC ACID) 500 MG tablet Take 1,000 mg by mouth daily. 04/29/11   [provider]   Physical Exam: Vitals:   11/10/21 1354 11/10/21 1356 11/10/21 2301  BP: 119/77    Pulse: 84    Resp: 16    Temp:  (!) 97.2 F (36.2 C) 97.6 F (36.4 C)  TempSrc: Oral Axillary Oral  SpO2: 94%     Constitutional: appears older than chronological age,  NAD, calm, comfortable Eyes: PERRL, lids and conjunctivae normal ENMT: Mucous membranes are moist. Posterior pharynx clear of any exudate or lesions. Age-appropriate dentition. Hearing appropriate Neck: normal, supple, no masses, no thyromegaly Respiratory: clear to auscultation bilaterally, no wheezing, no crackles. Normal respiratory effort. No accessory muscle use.  Cardiovascular: Regular rate and rhythm, no murmurs / rubs / gallops.  3+ bilateral lower extremity pitting edema. 2+ pedal pulses. No carotid bruits.  Abdomen: Morbidly obese abdomen, no tenderness,  no masses palpated, no hepatosplenomegaly. Bowel sounds positive.  Musculoskeletal: no clubbing / cyanosis. No joint deformity upper and lower extremities. Good ROM, no contractures, no atrophy. Normal muscle tone.  Skin: no rashes, lesions, ulcers. No induration Neurologic: Sensation intact. Strength 5/5 in all 4.  Psychiatric: Normal judgment and insight. Alert and oriented x 3. Normal mood.   EKG: Ordered  Chest x-ray on Admission: I personally reviewed and I agree with radiologist reading as below.  DG Chest Portable 1 View  Result Date: 11/10/2021 CLINICAL DATA:  Crackles EXAM: PORTABLE CHEST 1 VIEW COMPARISON:  03/02/2018 FINDINGS: Cardiomegaly. Pulmonary vascular congestion. Diffuse bilateral interstitial prominence. No large pleural fluid collection. No pneumothorax. IMPRESSION: Cardiomegaly with pulmonary vascular congestion and interstitial edema. Electronically Signed   By: Davina Poke D.O.   On: 11/10/2021 20:36   CT Head Wo Contrast  Result Date: 11/10/2021 CLINICAL DATA:  Altered mental status, neck trauma EXAM: CT HEAD WITHOUT CONTRAST CT CERVICAL SPINE WITHOUT CONTRAST TECHNIQUE: Multidetector CT imaging of the head and cervical spine was performed following the standard protocol without intravenous contrast. Multiplanar CT image reconstructions of the cervical spine were also generated. RADIATION DOSE  REDUCTION: This exam was performed according to the departmental dose-optimization program which includes automated exposure control, adjustment of the mA and/or kV according to patient size and/or use of iterative reconstruction technique. COMPARISON:  None Available. FINDINGS: CT HEAD FINDINGS Brain: No evidence of acute infarct, hemorrhage, mass, mass effect, or midline shift. No hydrocephalus or extra-axial fluid collection. Vascular: No hyperdense vessel. Skull: Normal. Negative for fracture or focal lesion. Sinuses/Orbits: Air-fluid level with bubbly fluid in the left maxillary sinus with mucosal thickening. The orbits are unremarkable. Other: The mastoid air cells are well aerated. CT CERVICAL SPINE FINDINGS Alignment: No listhesis. Mild reversal of the normal cervical lordosis. Skull base and vertebrae: No acute fracture. No primary bone lesion or focal pathologic process. Soft tissues and spinal canal: No prevertebral fluid or swelling. No visible canal hematoma. Disc levels: Mild degenerative changes. No high-grade spinal canal stenosis. Upper chest: Minimally included in the field of view. No acute finding. Other: None. IMPRESSION: 1. No acute intracranial process. 2. No acute fracture or traumatic listhesis in the cervical spine. 3. Air-fluid level with bubbly fluid in the left maxillary sinus, which can be seen in the setting of acute sinusitis. Electronically Signed   By: Merilyn Baba M.D.   On: 11/10/2021 15:53   CT Cervical Spine Wo Contrast  Result Date: 11/10/2021 CLINICAL DATA:  Altered mental status, neck trauma EXAM: CT HEAD WITHOUT CONTRAST CT CERVICAL SPINE WITHOUT CONTRAST TECHNIQUE: Multidetector CT imaging of the head and cervical spine was performed following the standard protocol without intravenous contrast. Multiplanar CT image reconstructions of the cervical spine were also generated. RADIATION DOSE REDUCTION: This exam was performed according to the departmental  dose-optimization program which includes automated exposure control, adjustment of the mA and/or kV according to patient size and/or use of iterative reconstruction technique. COMPARISON:  None Available. FINDINGS: CT HEAD FINDINGS Brain: No evidence of acute infarct, hemorrhage, mass, mass effect, or midline shift. No hydrocephalus or extra-axial fluid collection. Vascular: No hyperdense vessel. Skull: Normal. Negative for fracture or focal lesion. Sinuses/Orbits: Air-fluid level with bubbly fluid in the left maxillary sinus with mucosal thickening. The orbits are unremarkable. Other: The mastoid air cells are well aerated. CT CERVICAL SPINE FINDINGS Alignment: No listhesis. Mild reversal of the normal cervical lordosis. Skull base and vertebrae: No acute fracture. No primary bone  lesion or focal pathologic process. Soft tissues and spinal canal: No prevertebral fluid or swelling. No visible canal hematoma. Disc levels: Mild degenerative changes. No high-grade spinal canal stenosis. Upper chest: Minimally included in the field of view. No acute finding. Other: None. IMPRESSION: 1. No acute intracranial process. 2. No acute fracture or traumatic listhesis in the cervical spine. 3. Air-fluid level with bubbly fluid in the left maxillary sinus, which can be seen in the setting of acute sinusitis. Electronically Signed   By: Merilyn Baba M.D.   On: 11/10/2021 15:53    Labs on Admission: I have personally reviewed following labs  CBC: Recent Labs  Lab 11/10/21 1404  WBC 4.4  HGB 11.1*  HCT 36.1*  MCV 99.7  PLT 90*   Basic Metabolic Panel: Recent Labs  Lab 11/10/21 1404  NA 123*  K 3.7  CL 83*  CO2 29  GLUCOSE 77  BUN 104*  CREATININE 8.08*  CALCIUM 7.9*   GFR: Estimated Creatinine Clearance: 15.8 mL/min (A) (by C-G formula based on SCr of 8.08 mg/dL (H)).  Liver Function Tests: Recent Labs  Lab 11/10/21 1404  AST 61*  ALT 55*  ALKPHOS 468*  BILITOT 3.4*  PROT 5.8*  ALBUMIN 2.1*    Recent Labs  Lab 11/10/21 1404  LIPASE 97*   Cardiac Enzymes: Recent Labs  Lab 11/10/21 1404  CKTOTAL 369   Urine analysis:    Component Value Date/Time   COLORURINE AMBER (A) 02/28/2018 1910   APPEARANCEUR CLEAR (A) 02/28/2018 1910   LABSPEC 1.018 02/28/2018 1910   PHURINE 5.0 02/28/2018 1910   GLUCOSEU NEGATIVE 02/28/2018 1910   HGBUR NEGATIVE 02/28/2018 1910   BILIRUBINUR NEGATIVE 02/28/2018 1910   KETONESUR NEGATIVE 02/28/2018 1910   PROTEINUR NEGATIVE 02/28/2018 1910   NITRITE NEGATIVE 02/28/2018 1910   LEUKOCYTESUR NEGATIVE 02/28/2018 1910   Dr. Tobie Poet [facility]  If 7PM-7AM, please contact overnight-coverage provider If 7AM-7PM, please contact day coverage provider [email or web address]  11/10/2021, 11:14 PM

## 2021-11-10 NOTE — ED Triage Notes (Signed)
Pt to ED via [facility] from the doctors office for diarrhea and a sore on his right arm. Pt states that this has been going on for about a week. Pt states that he just wants to get checked out to make sure he is ok.

## 2021-11-10 NOTE — Patient Instructions (Addendum)
Thank you for coming to the office today.  EMS today to hospital Leave vehicle at our office They will do labs and further treatment  Please schedule a Follow-up Appointment to: Return if symptoms worsen or fail to improve.  If you have any other questions or concerns, please feel free to call the office or send a message through [place]. You may also schedule an earlier appointment if necessary.  Additionally, you may be receiving a survey about your experience at our office within a few days to 1 week by e-mail or mail. We value your feedback.  Darren Putnam, DO [place]

## 2021-11-10 NOTE — Hospital Course (Addendum)
Mr. Ranveer Wahlstrom is a 56 year old male morbid obesity, hyperlipidemia, hypertension, insulin-dependent diabetes mellitus, GERD, who presents emergency department for chief concerns of abnormal labs.  Initial vitals in the emergency department showed temperature of 97.2, respiration rate of 16, heart rate of 84, blood pressure 119/77, SPO2 of 94% on room air.  Serum sodium is 123, potassium 3.7, chloride 83, bicarb 29, BUN of 104, serum creatinine of 8.08, nonfasting blood glucose 77, WBC 4.4, hemoglobin 11.1, platelets of 90.  CK 369.  ED treatment: Sodium chloride 1 L bolus  Patient was given another fluid bolus and started on maintenance fluids at 40 cc/h.  Nephrology was consulted.  Repeat labs show a creatinine of 8.15 with a GFR of 7 and a sodium of 127, platelets of 96, hemoglobin of 10.7.

## 2021-11-10 NOTE — Telephone Encounter (Signed)
Reason for Disposition  [1] Depression symptoms (sadness, hopelessness, decreased energy) AND [2] unable to do any normal activities (e.g., self care, school, work; in comparison to baseline).  Answer Assessment - Initial Assessment Questions 1. MAIN CONCERN: "What happened that made you call today?"     Concerned about son's mental 2. RISK OF HARM - SUICIDAL IDEATION:  "Do you ever have thoughts of hurting or killing yourself?"  (e.g., yes, no, no but preoccupation with thoughts about death)   - WISH TO BE DEAD:  "Have you wished you were dead or wished you could go to sleep and not wake up?"   - INTENT:  "Have you had any thoughts of hurting or killing yourself?" (e.g., yes, no, N/A) If Yes, ask: "Are you having these thoughts about killing yourself right NOW?"   - PLAN: "Have you thought about how you might do this?" "Do you have a specific plan for how you would do this?" (e.g., gun, knife, overdose, no plan, N/A)   - ACCESS: If yes to PLAN, "Do you have access to drugs?" (e.g., pills, gun in house, knife in kitchen)     Plan is to take overdose of drugs- not sure if there are pills in the home 3. RISK OF HARM - SUICIDE ATTEMPT: "Have you tried to harm yourself recently?" If Yes, ask: When was this?"  "What type of harm was tried?"     no 4. RISK OF HARM - SUICIDAL BEHAVIOR: "Have you ever done anything, started to do anything, or prepared to do anything to end your life?" (e.g., collected pills, bought a gun, wrote a suicide note, cut yourself, started but changed your mind)     no 5. EVENTS AND STRESSORS: "Has there been any new stress or recent changes in your life?" (e.g., death of loved one, homelessness, negative event, relationship breakup, work)     Weight gain, increased alcohol 6. FUNCTIONAL IMPAIRMENT: "How have things been going for you overall? Have you had more difficulty than usual doing your normal daily activities?"  (e.g., better, same, worse; self-care, school, work,  interactions)     Not sure if diabetes is in control 7. SUPPORT: "Who is with you now?" "Who do you live with?" "Do you have family or friends who you can talk to?"      Daughter has been trying to stay with him 8. THERAPIST: "Do you have a counselor or therapist? What is their name?"       9. ALCOHOL USE OR SUBSTANCE USE (DRUG USE): "Do you drink alcohol or use any illegal drugs (or prescription drugs in ways other than prescribed)?"     Yes- alcohol 10. OTHER: "Do you have any other physical symptoms right now?" (e.g., fever)         11. PREGNANCY or POSTPARTUM: "Is there any chance you are pregnant?" "When was your last menstrual period?" "Were you recently pregnant?" "When did you give birth?"  Protocols used: Suicide Concerns-A-AH

## 2021-11-11 ENCOUNTER — Inpatient Hospital Stay
Admit: 2021-11-11 | Discharge: 2021-11-11 | Disposition: A | Discharge status: Home / Self Care | Payer: PPO | Attending: Internal Medicine | Admitting: Internal Medicine

## 2021-11-11 ENCOUNTER — Other Ambulatory Visit: Payer: Self-pay

## 2021-11-11 DIAGNOSIS — G894 Chronic pain syndrome: Secondary | ICD-10-CM

## 2021-11-11 DIAGNOSIS — Z720 Tobacco use: Secondary | ICD-10-CM

## 2021-11-11 DIAGNOSIS — E1122 Type 2 diabetes mellitus with diabetic chronic kidney disease: Secondary | ICD-10-CM

## 2021-11-11 DIAGNOSIS — I959 Hypotension, unspecified: Secondary | ICD-10-CM | POA: Diagnosis not present

## 2021-11-11 DIAGNOSIS — N179 Acute kidney failure, unspecified: Secondary | ICD-10-CM | POA: Diagnosis not present

## 2021-11-11 DIAGNOSIS — Z794 Long term (current) use of insulin: Secondary | ICD-10-CM

## 2021-11-11 DIAGNOSIS — R197 Diarrhea, unspecified: Secondary | ICD-10-CM

## 2021-11-11 DIAGNOSIS — E8770 Fluid overload, unspecified: Secondary | ICD-10-CM

## 2021-11-11 DIAGNOSIS — N184 Chronic kidney disease, stage 4 (severe): Secondary | ICD-10-CM

## 2021-11-11 DIAGNOSIS — E871 Hypo-osmolality and hyponatremia: Secondary | ICD-10-CM

## 2021-11-11 DIAGNOSIS — D696 Thrombocytopenia, unspecified: Secondary | ICD-10-CM

## 2021-11-11 DIAGNOSIS — K219 Gastro-esophageal reflux disease without esophagitis: Secondary | ICD-10-CM

## 2021-11-11 DIAGNOSIS — N189 Chronic kidney disease, unspecified: Secondary | ICD-10-CM

## 2021-11-11 LAB — URINALYSIS, ROUTINE W REFLEX MICROSCOPIC
Bilirubin Urine: NEGATIVE
Glucose, UA: NEGATIVE mg/dL
Hgb urine dipstick: NEGATIVE
Ketones, ur: NEGATIVE mg/dL
Leukocytes,Ua: NEGATIVE
Nitrite: NEGATIVE
Protein, ur: NEGATIVE mg/dL
Specific Gravity, Urine: 1.01 (ref 1.005–1.030)
pH: 5 (ref 5.0–8.0)

## 2021-11-11 LAB — URINE DRUG SCREEN, QUALITATIVE (ARMC ONLY)
Amphetamines, Ur Screen: NOT DETECTED
Barbiturates, Ur Screen: NOT DETECTED
Benzodiazepine, Ur Scrn: NOT DETECTED
Cannabinoid 50 Ng, Ur ~~LOC~~: NOT DETECTED
Cocaine Metabolite,Ur ~~LOC~~: NOT DETECTED
MDMA (Ecstasy)Ur Screen: NOT DETECTED
Methadone Scn, Ur: NOT DETECTED
Opiate, Ur Screen: NOT DETECTED
Phencyclidine (PCP) Ur S: NOT DETECTED
Tricyclic, Ur Screen: NOT DETECTED

## 2021-11-11 LAB — ECHOCARDIOGRAM COMPLETE
Area-P 1/2: 4.8 cm2
Height: 71 in
S' Lateral: 4.5 cm
Weight: 5425.08 oz

## 2021-11-11 LAB — GASTROINTESTINAL PANEL BY PCR, STOOL (REPLACES STOOL CULTURE)

## 2021-11-11 LAB — CBC
HCT: 36.2 % — ABNORMAL LOW (ref 39.0–52.0)
Hemoglobin: 10.7 g/dL — ABNORMAL LOW (ref 13.0–17.0)
MCH: 30.1 pg (ref 26.0–34.0)
MCHC: 29.6 g/dL — ABNORMAL LOW (ref 30.0–36.0)
MCV: 102 fL — ABNORMAL HIGH (ref 80.0–100.0)
Platelets: 96 10*3/uL — ABNORMAL LOW (ref 150–400)
RBC: 3.55 MIL/uL — ABNORMAL LOW (ref 4.22–5.81)
RDW: 24.5 % — ABNORMAL HIGH (ref 11.5–15.5)
WBC: 4.5 10*3/uL (ref 4.0–10.5)
nRBC: 1.1 % — ABNORMAL HIGH (ref 0.0–0.2)

## 2021-11-11 LAB — BASIC METABOLIC PANEL
Anion gap: 12 (ref 5–15)
Anion gap: 11 (ref 5–15)
BUN: 101 mg/dL — ABNORMAL HIGH (ref 6–20)
BUN: 112 mg/dL — ABNORMAL HIGH (ref 6–20)
CO2: 30 mmol/L (ref 22–32)
CO2: 29 mmol/L (ref 22–32)
Calcium: 8 mg/dL — ABNORMAL LOW (ref 8.9–10.3)
Calcium: 8.1 mg/dL — ABNORMAL LOW (ref 8.9–10.3)
Chloride: 83 mmol/L — ABNORMAL LOW (ref 98–111)
Chloride: 87 mmol/L — ABNORMAL LOW (ref 98–111)
Creatinine, Ser: 8.22 mg/dL — ABNORMAL HIGH (ref 0.61–1.24)
Creatinine, Ser: 8.15 mg/dL — ABNORMAL HIGH (ref 0.61–1.24)
GFR, Estimated: 7 mL/min — ABNORMAL LOW (ref >60)
GFR, Estimated: 7 mL/min — ABNORMAL LOW (ref >60)
Glucose, Bld: 102 mg/dL — ABNORMAL HIGH (ref 70–99)
Glucose, Bld: 146 mg/dL — ABNORMAL HIGH (ref 70–99)
Potassium: 3.7 mmol/L (ref 3.5–5.1)
Potassium: 3.7 mmol/L (ref 3.5–5.1)
Sodium: 125 mmol/L — ABNORMAL LOW (ref 135–145)
Sodium: 127 mmol/L — ABNORMAL LOW (ref 135–145)

## 2021-11-11 LAB — HIV ANTIBODY (ROUTINE TESTING W REFLEX): HIV Screen 4th Generation wRfx: NONREACTIVE

## 2021-11-11 LAB — SARS CORONAVIRUS 2 BY RT PCR: SARS Coronavirus 2 by RT PCR: NEGATIVE

## 2021-11-11 LAB — AMMONIA: Ammonia: 52 umol/L — ABNORMAL HIGH (ref 9–35)

## 2021-11-11 LAB — CBG MONITORING, ED
Glucose-Capillary: 145 mg/dL — ABNORMAL HIGH (ref 70–99)
Glucose-Capillary: 109 mg/dL — ABNORMAL HIGH (ref 70–99)
Glucose-Capillary: 98 mg/dL (ref 70–99)
Glucose-Capillary: 102 mg/dL — ABNORMAL HIGH (ref 70–99)

## 2021-11-11 LAB — BRAIN NATRIURETIC PEPTIDE: B Natriuretic Peptide: 1223 pg/mL — ABNORMAL HIGH (ref 0.0–100.0)

## 2021-11-11 LAB — C DIFFICILE QUICK SCREEN W PCR REFLEX
C Diff antigen: NEGATIVE
C Diff interpretation: NOT DETECTED
C Diff toxin: NEGATIVE

## 2021-11-11 LAB — TROPONIN I (HIGH SENSITIVITY)
Troponin I (High Sensitivity): 36 ng/L — ABNORMAL HIGH (ref <18)
Troponin I (High Sensitivity): 36 ng/L — ABNORMAL HIGH (ref <18)

## 2021-11-11 MED ORDER — LIDOCAINE 5 % EX PTCH
1.0000 | MEDICATED_PATCH | CUTANEOUS | Status: DC
  Administered 2021-11-11 – 2021-11-14 (×4): 1 via TRANSDERMAL
  Filled 2021-11-11 (×5): qty 1

## 2021-11-11 MED ORDER — SODIUM CHLORIDE 0.9 % IV SOLN: INTRAVENOUS | Status: AC

## 2021-11-11 MED ORDER — GABAPENTIN 600 MG PO TABS
300.0000 mg | ORAL_TABLET | Freq: Once | ORAL | Status: AC
  Administered 2021-11-11: 300 mg via ORAL
  Filled 2021-11-11: qty 1

## 2021-11-11 MED ORDER — SODIUM CHLORIDE 0.9 % IV BOLUS
500.0000 mL | Freq: Once | INTRAVENOUS | Status: AC
  Administered 2021-11-11: 500 mL via INTRAVENOUS

## 2021-11-11 NOTE — Assessment & Plan Note (Signed)
Hold antihypertensive medications currently

## 2021-11-11 NOTE — Progress Notes (Signed)
PT Cancellation Note  Patient Details Name: [name] MRN: [id number] DOB: 1965/12/28   Cancelled Treatment:    Reason Eval/Treat Not Completed: Fatigue/lethargy limiting ability to participate. Chart reviewed, RN consulted. Pt in recliner on entry, fast asleep, breathing robustly. Pt does not awaken to verbal greetings or left shoulder tappings. Author decided to let pt remain undisturbed and defer eval to next day. Waiting for response from RN regarding mobility details.  3:53 PM, 11/11/21 Etta Grandchild, PT, DPT Physical Therapist - [facility]  [phone] ([place])    Chaske Paskett C 11/11/2021, 3:52 PM

## 2021-11-11 NOTE — ED Notes (Signed)
Patient asleep, NAD 

## 2021-11-11 NOTE — ED Notes (Signed)
Patient's visitor at nurse's station requesting pretzels for patient. Visitor informed that pretzels were not available but RN could bring crackers to bedside. Visitor asked why patient was in the hospital. Visitor informed that patient is AOX4 and can tell her but RN will not give patient's private medical information as that is a HIPPA violation. RN brought crackers and peanut butter to bedside. Patient sitting upright in chair with multiple blankets over himself watching television with even, unlabored respirations. Patient asking what crackers were for. Patient informed no pretzels were available and crackers was all that ER had for snacks. Patient asked RN if someone could go to the store and get him cashews if he gave them fifty dollars. Patient informed that staff are here to treat medical emergencies and will not be going to the store to get him snacks. Patient asked for additional warm blanket. Warm blanket provided per request. Visitors at bedside.

## 2021-11-11 NOTE — ED Notes (Signed)
This RN answered patient's call light. Patient states he has been asking for a wash cloth. Patient also stated he wanted the bed moved closer to him "for the eighteenth time." Patient informed that it has not been "the eighteenth time" and this was the first time patient has asked RN. Patient educated on appropriate way to communicate with staff. Patient responded, "Someone must have pissed you off." Patient informed that RN is not angry but is setting proper boundaries and will not be spoken to in such manner. Patient admitted that he has fallen in the past three months so RN informed patient that bed will not be moved closer because if patient wishes to get up from chair to bed he must call for assistance as RN is concerned about patient's safety and does not want patient to fall and further injure himself. Patient verbalized understanding. Patient informed that he may be moved to holding area of the ER. Patient verbalized wishes to remain in main ER. Patient educated that the community needs emergency care access and that moving to a holding area allows a member of the community to receive emergency care. Patient agreeable to move. Resp even, unlabored on RA. Washcloths provided per request. XL gown placed over patient's back per request. Phone and call light in reach. No distress noted.

## 2021-11-11 NOTE — ED Notes (Signed)
Patient refused hospital bed, states last time the hospital bed almost "killed him." Requesting to keep hospital stretcher. Placed on stretcher and transferred to room 35. Report given to Cristie Hem, RN. Care transferred at this time.

## 2021-11-11 NOTE — Assessment & Plan Note (Signed)
Continue to monitor.  Check a hepatitis C level.

## 2021-11-11 NOTE — Assessment & Plan Note (Signed)
Likely from dehydration with not eating for 2 weeks and diarrhea.  Patient on gentle IV fluids.

## 2021-11-11 NOTE — Progress Notes (Signed)
       CROSS COVER NOTE  NAME: [name] MRN: [id number] DOB : 04-12-1965    Date of Service   11/11/2021   HPI/Events of Note   Medication request received for patient report of lower back pain that patient attributes to the ED recliner being uncomfortable.  Interventions   Assessment/Plan:  Lidocaine patch Low dose Gabapentin x1 Kpad      This document was prepared using Dragon voice recognition software and may include unintentional dictation errors.  Neomia Glass DNP, MBA, FNP-BC Nurse Practitioner [facility] Pager [phone]

## 2021-11-11 NOTE — Progress Notes (Signed)
*  PRELIMINARY RESULTS* Echocardiogram 2D Echocardiogram has been performed.  [name] 11/11/2021, 3:24 PM

## 2021-11-11 NOTE — Progress Notes (Signed)
[name]  MRN: [id number]  DOB/AGE: 1965/02/13 56 y.o.  Primary Care Physician:[name], [name], DO  Admit date: 11/10/2021  Chief Complaint:  Chief Complaint  Patient presents with   Diarrhea   Wound Check    S-Pt presented on  11/10/2021 with  Chief Complaint  Patient presents with   Diarrhea   Wound Check  . Patient is a 56 year old African-American male with a past medical history of morbid obesity, hyperlipidemia, hypertension, diabetes mellitus type 2, GERD who was asked to come to the ER with chief complaint of diarrhea.  History of present illness dates back to 2 weeks ago when patient started having diarrhea.  Patient also gives a history of nausea and vomiting. Patient also gives a history of fall. Patient went to see his primary care yesterday and was requested to come to the ER. Upon evaluation in the ER patient was found to have serum sodium 123, BUN of 104 and serum creatinine of 8.  Patient was admitted for further care.   Nephrology was consulted for acute kidney injury.Patient was seen today in the ER Patient is well-known to our practice as patient does see Dr. Candiss Norse now, in the past patient has been followed by Dr. Juleen China.  Patient gave a history that he was not eating or drinking much from past few days but was not feeling better Patient offers no complaint of change in taste No complaint of change in appetite No complaint of malaise I then discussed with the patient about his kidney related issues. -I discussed the patient about his low GFR and possible need for renal placement therapy. Patient informed me in clear terms that he does not wish for dialysis at any cost.  Patient then informed me that in the past he has had multiple episodes where his kidney function has gone down and usually recovered.  Patient was not willing to go for renal placement therapy .   Medications   aspirin  81 mg Oral Daily   atorvastatin  20 mg Oral Daily    heparin  5,000 Units Subcutaneous Q8H   insulin aspart  0-5 Units Subcutaneous QHS   insulin aspart  0-9 Units Subcutaneous TID WC   lidocaine  1 patch Transdermal Q24H   pantoprazole  80 mg Oral BID         FTD:DUKGU from the symptoms mentioned above,there are no other symptoms referable to all systems reviewed.  Physical Exam: Vital signs in last 24 hours: Temp:  [97.5 F (36.4 C)-98.3 F (36.8 C)] 97.7 F (36.5 C) (10/17 1614) Pulse Rate:  [76-81] 78 (10/17 1614) Resp:  [14-22] 19 (10/17 1614) BP: (92-128)/(69-99) 128/86 (10/17 1614) SpO2:  [95 %-97 %] 97 % (10/17 1614) Weight:  [153.8 kg] 153.8 kg (10/17 1119) Weight change:     Intake/Output from previous day: 10/16 0701 - 10/17 0700 In: 1000 [IV Piggyback:1000] Out: -  No intake/output data recorded.   Physical Exam:  General- pt is awake,alert, oriented to time place and person  HEENT-head is atraumatic normocephalic, sclera is muddy  Resp- No acute REsp distress, Decreased breath sounds at bases  CVS- S1S2 regular in rate and rhythm  GIT- BS+, soft, Non tender , Non distended, Morbidly obese  EXT- Chronic venous stasis changes,  No Cyanosis   Lab Results:  CBC  Recent Labs    11/10/21 2303 11/11/21 0859  WBC 4.4 4.5  HGB 10.9* 10.7*  HCT 36.8* 36.2*  PLT 90* 96*    BMET  Recent Labs    11/10/21 2303 11/11/21 0859  NA 125* 127*  K 3.7 3.7  CL 83* 87*  CO2 30 29  GLUCOSE 102* 146*  BUN 101* 112*  CREATININE 8.22* 8.15*  CALCIUM 8.0* 8.1*      Most recent Creatinine trend  Lab Results  Component Value Date   CREATININE 8.15 (H) 11/11/2021   CREATININE 8.22 (H) 11/10/2021   CREATININE 8.08 (H) 11/10/2021      MICRO   Recent Results (from the past 240 hour(s))  Gastrointestinal Panel by PCR , Stool     Status: None   Collection Time: 11/10/21 11:03 PM   Specimen: Stool  Result Value Ref Range Status   Campylobacter species NOT DETECTED NOT DETECTED Final    Plesimonas shigelloides NOT DETECTED NOT DETECTED Final   Salmonella species NOT DETECTED NOT DETECTED Final   Yersinia enterocolitica NOT DETECTED NOT DETECTED Final   Vibrio species NOT DETECTED NOT DETECTED Final   Vibrio cholerae NOT DETECTED NOT DETECTED Final   Enteroaggregative E coli (EAEC) NOT DETECTED NOT DETECTED Final   Enteropathogenic E coli (EPEC) NOT DETECTED NOT DETECTED Final   Enterotoxigenic E coli (ETEC) NOT DETECTED NOT DETECTED Final   Shiga like toxin producing E coli (STEC) NOT DETECTED NOT DETECTED Final   Shigella/Enteroinvasive E coli (EIEC) NOT DETECTED NOT DETECTED Final   Cryptosporidium NOT DETECTED NOT DETECTED Final   Cyclospora cayetanensis NOT DETECTED NOT DETECTED Final   Entamoeba histolytica NOT DETECTED NOT DETECTED Final   Giardia lamblia NOT DETECTED NOT DETECTED Final   Adenovirus F40/41 NOT DETECTED NOT DETECTED Final   Astrovirus NOT DETECTED NOT DETECTED Final   Norovirus GI/GII NOT DETECTED NOT DETECTED Final   Rotavirus A NOT DETECTED NOT DETECTED Final   Sapovirus (I, II, IV, and V) NOT DETECTED NOT DETECTED Final    Comment: Performed at [facility], [address] [place], [place] [address]  C Difficile Quick Screen w PCR reflex     Status: None   Collection Time: 11/10/21 11:03 PM   Specimen: Stool  Result Value Ref Range Status   C Diff antigen NEGATIVE NEGATIVE Final   C Diff toxin NEGATIVE NEGATIVE Final   C Diff interpretation No C. difficile detected.  Final    Comment: Performed at [facility], [address] [place], [place] [address]  SARS Coronavirus 2 by RT PCR (hospital order, performed in [facility] hospital lab) *cepheid single result test* Anterior Nasal Swab     Status: None   Collection Time: 11/10/21 11:03 PM   Specimen: Anterior Nasal Swab  Result Value Ref Range Status   SARS Coronavirus 2 by RT PCR NEGATIVE NEGATIVE Final    Comment: (NOTE) SARS-CoV-2 target nucleic acids are NOT  DETECTED.  The SARS-CoV-2 RNA is generally detectable in upper and lower respiratory specimens during the acute phase of infection. The lowest concentration of SARS-CoV-2 viral copies this assay can detect is 250 copies / mL. A negative result does not preclude SARS-CoV-2 infection and should not be used as the sole basis for treatment or other patient management decisions.  A negative result may occur with improper specimen collection / handling, submission of specimen other than nasopharyngeal swab, presence of viral mutation(s) within the areas targeted by this assay, and inadequate number of viral copies (<250 copies / mL). A negative result must be combined with clinical observations, patient history, and epidemiological information.  Fact Sheet for Patients:   https://www.patel.info/  Fact  Sheet for Healthcare Providers: https://hall.com/  This test is not yet approved or  cleared by the [place] and has been authorized for detection and/or diagnosis of SARS-CoV-2 by FDA under an Emergency Use Authorization (EUA).  This EUA will remain in effect (meaning this test can be used) for the duration of the COVID-19 declaration under Section 564(b)(1) of the Act, 21 U.S.C. section 360bbb-3(b)(1), unless the authorization is terminated or revoked sooner.  Performed at [facility], [address], [place], [place] [address]     Renal ultrasound done on November 11, 2021 was reviewed  Right Kidney:   Renal measurements: 10.9 x 6.0 x 5.2 cm = volume: 177 mL. Echogenicity within normal limits. No mass or hydronephrosis visualized.   Left Kidney:   Renal measurements: 10.4 x 7.0 x 6.1 cm = volume: 233 mL. Echogenicity within normal limits. No mass or hydronephrosis visualized.   Bladder:   Appears normal for degree of bladder distention.   Other:   Incidental note of sludge and stones within the gallbladder.    IMPRESSION: Unremarkable ultrasound of the kidneys.   Cholelithiasis.  Chest x-ray done on November 10, 2021 was reviewed FINDINGS: Cardiomegaly. Pulmonary vascular congestion. Diffuse bilateral interstitial prominence. No large pleural fluid collection. No pneumothorax.   IMPRESSION: Cardiomegaly with pulmonary vascular congestion and interstitial edema.   Impression:   1)Renal    Patient has acute kidney injury Patient has AKI secondary to ATN AKI with some component of prerenal issues as patient was having decreased oral intake Patient renal ultrasound does not show any  obstruction Patient has AKI on CKD Patient has baseline CKD stage IV Patient has CKD stage IV secondary diabetes mellitus Patient CKD has been marked with multiple episodes of AKI    Creatinine trend 2023 2.1--2.3 and now at 8.2 2022 3.0--5.0-AKI 2021 2.5--4.2-AKI 2020 2019 2.0 2018 2.7--3.7 2017 1.6--2.1 2016 1.3--1.6 3.0  2)HTN    Blood pressure is stable    3)Anemia of chronic disease     Latest Ref Rng & Units 11/11/2021    8:59 AM 11/10/2021   11:03 PM 11/10/2021    2:04 PM  CBC  WBC 4.0 - 10.5 K/uL 4.5  4.4  4.4   Hemoglobin 13.0 - 17.0 g/dL 10.7  10.9  11.1   Hematocrit 39.0 - 52.0 % 36.2  36.8  36.1   Platelets 150 - 400 K/uL 96  90  90        HGb at goal (9--11)   4) Secondary hyperparathyroidism -CKD Mineral-Bone Disorder    Lab Results  Component Value Date   CALCIUM 8.1 (L) 11/11/2021    Secondary Hyperparathyroidism  present . Patient has history of secondary hyperparathyroidism Patient intact PTH has been elevated going back to 2020  Phosphorus at goal.   5)Diabetes mellitus type 2 Being followed by the primary team    6) Electrolytes      Latest Ref Rng & Units 11/11/2021    8:59 AM 11/10/2021   11:03 PM 11/10/2021    2:04 PM  BMP  Glucose 70 - 99 mg/dL 146  102  77   BUN 6 - 20 mg/dL 112  101  104   Creatinine 0.61 - 1.24 mg/dL 8.15   8.22  8.08   Sodium 135 - 145 mmol/L 127  125  123   Potassium 3.5 - 5.1 mmol/L 3.7  3.7  3.7   Chloride 98 - 111 mmol/L 87  83  83  CO2 22 - 32 mmol/L 29  30  29    Calcium 8.9 - 10.3 mg/dL 8.1  8.0  7.9      Sodium Hyponatremia Patient has hyponatremia secondary to Some component of intravascular hypovolemia Patient sodium is improving slowly   Potassium Normokalemic    7)Acid base    Co2 at goal  8) Right-sided heart failure  Patient 2D echo done and 2020 had shown  Right Ventricle: The right ventricle has mildly reduced systolic function.  The cavity in severely enlarged in size. There is no increase in right  ventricular wall thickness. Right ventricular systolic pressure could not  be assessed.   Plan:   Agree with gentle hydration Patient BUN is more than 100, patient has history of multiple AKI's. Patient is not agreeable for renal replacement therapy at this time We will continue to follow .     Ferron Ishmael s Theador Hawthorne 11/11/2021, 6:12 PM

## 2021-11-11 NOTE — ED Notes (Deleted)
[facility]  CALLED  FOR  TRANSPORT  TO  [facility] 

## 2021-11-11 NOTE — ED Notes (Signed)
Pt given phone, and told to call his mother.

## 2021-11-11 NOTE — ED Notes (Signed)
Pt given phone to place food order.

## 2021-11-11 NOTE — ED Notes (Signed)
Patient mother is at bedside requesting an update. Text sent to Dr. Leslye Peer.

## 2021-11-11 NOTE — Progress Notes (Signed)
Progress Note   Patient: [name] NWG:[id number] DOB: 06/29/1965 DOA: 11/10/2021     1 DOS: the patient was seen and examined on 11/11/2021   Brief hospital course: [name] is a 56 year old male morbid obesity, hyperlipidemia, hypertension, insulin-dependent diabetes mellitus, GERD, who presents emergency department for chief concerns of abnormal labs.  Initial vitals in the emergency department showed temperature of 97.2, respiration rate of 16, heart rate of 84, blood pressure 119/77, SPO2 of 94% on room air.  Serum sodium is 123, potassium 3.7, chloride 83, bicarb 29, BUN of 104, serum creatinine of 8.08, nonfasting blood glucose 77, WBC 4.4, hemoglobin 11.1, platelets of 90.  CK 369.  ED treatment: Sodium chloride 1 L bolus  Patient was given another fluid bolus and started on maintenance fluids at 40 cc/h.  Nephrology was consulted.  Repeat labs show a creatinine of 8.15 with a GFR of 7 and a sodium of 127, platelets of 96, hemoglobin of 10.7.  Assessment and Plan: * Acute kidney injury superimposed on CKD ([place]) Acute kidney injury on CKD stage IIIb.  Baseline creatinine around 2.1 with a GFR of 37.  Most recent creatinine 8.15.  Patient declined dialysis.  He also stated that he is not resting in hospital to home.  I told him kidneys are quick to impairment slow to improve.  We gave him another fluid bolus and started gentle IV fluid hydration.  Thrombocytopenia ([place]) Continue to monitor.  Check a hepatitis C level.  Hyponatremia Likely from dehydration with not eating for 2 weeks and diarrhea.  Patient on gentle IV fluids.  Hypotension Hold antihypertensive medications currently  Volume overload Watch closely with IV fluids.  Diarrhea Stool studies negative.  Advance diet.  Chronic pain syndrome Tramadol is not a good medication with acute kidney injury.  Careful with this medication.  GERD (gastroesophageal reflux disease) - PPI  Current tobacco  use - PRN nicotine patch  Hyperlipidemia associated with type 2 diabetes mellitus (HCC) Continue atorvastatin  Type 2 diabetes mellitus, controlled, with renal complications (HCC) Holding long-acting insulin and using sliding scale only.  Benign hypertension with CKD (chronic kidney disease) stage IV (HCC) Holding Coreg  Morbid obesity with BMI of 45.0-49.9, adult ([place]) - Patient has BMI of 47.29 with current height and weight in computer.        Subjective: Patient feels better.  He did not eat or drink very much for the last 2 weeks.  Was able to eat today.  His abdomen is less distended.  Stool studies were negative.  Admitted with acute kidney injury on CKD.  Patient does not want to stay in the hospital too long.  He does not want dialysis.  Physical Exam: Vitals:   11/11/21 0542 11/11/21 0620 11/11/21 1119 11/11/21 1122  BP:  92/69 111/73 111/73  Pulse:  79  76  Resp:  14 16 15   Temp: 98.3 F (36.8 C)  (!) 97.5 F (36.4 C) 98 F (36.7 C)  TempSrc: Oral  Oral Oral  SpO2:  95% 96%   Weight:   (!) 153.8 kg   Height:   5\' 11"  (1.803 m)    Physical Exam HENT:     Head: Normocephalic.     Mouth/Throat:     Pharynx: No oropharyngeal exudate.  Eyes:     General: Lids are normal.     Conjunctiva/sclera: Conjunctivae normal.  Cardiovascular:     Rate and Rhythm: Normal rate and regular rhythm.  Heart sounds: Normal heart sounds, S1 normal and S2 normal.  Pulmonary:     Breath sounds: Examination of the right-lower field reveals decreased breath sounds and wheezing. Examination of the left-lower field reveals decreased breath sounds and wheezing. Decreased breath sounds and wheezing present. No rhonchi or rales.  Abdominal:     Palpations: Abdomen is soft.     Tenderness: There is no abdominal tenderness.  Musculoskeletal:     Right lower leg: Swelling present.     Left lower leg: Swelling present.  Skin:    General: Skin is warm.     Findings: No rash.   Neurological:     Mental Status: He is alert and oriented to person, place, and time.     Data Reviewed: Sodium 127, creatinine 8.15, hemoglobin 10.7, platelet count 96  Family Communication: Left message for patient's mother  Disposition: Status is: Inpatient Remains inpatient appropriate because: Treating acute kidney injury on CKD requiring IV fluids currently.  Planned Discharge Destination: Home    Time spent: 30 minutes  Author: Loletha Grayer, MD 11/11/2021 12:37 PM  For on call review [email or web address].

## 2021-11-12 DIAGNOSIS — N179 Acute kidney failure, unspecified: Secondary | ICD-10-CM | POA: Diagnosis present

## 2021-11-12 DIAGNOSIS — L8990 Pressure ulcer of unspecified site, unspecified stage: Secondary | ICD-10-CM | POA: Insufficient documentation

## 2021-11-12 DIAGNOSIS — N189 Chronic kidney disease, unspecified: Secondary | ICD-10-CM | POA: Diagnosis not present

## 2021-11-12 LAB — BASIC METABOLIC PANEL
Anion gap: 9 (ref 5–15)
BUN: 115 mg/dL — ABNORMAL HIGH (ref 6–20)
CO2: 30 mmol/L (ref 22–32)
Calcium: 8.1 mg/dL — ABNORMAL LOW (ref 8.9–10.3)
Chloride: 92 mmol/L — ABNORMAL LOW (ref 98–111)
Creatinine, Ser: 8.26 mg/dL — ABNORMAL HIGH (ref 0.61–1.24)
GFR, Estimated: 7 mL/min — ABNORMAL LOW (ref >60)
Glucose, Bld: 102 mg/dL — ABNORMAL HIGH (ref 70–99)
Potassium: 3.9 mmol/L (ref 3.5–5.1)
Sodium: 131 mmol/L — ABNORMAL LOW (ref 135–145)

## 2021-11-12 LAB — GLUCOSE, CAPILLARY
Glucose-Capillary: 90 mg/dL (ref 70–99)
Glucose-Capillary: 112 mg/dL — ABNORMAL HIGH (ref 70–99)

## 2021-11-12 LAB — CBC
HCT: 35.2 % — ABNORMAL LOW (ref 39.0–52.0)
Hemoglobin: 10.7 g/dL — ABNORMAL LOW (ref 13.0–17.0)
MCH: 31.5 pg (ref 26.0–34.0)
MCHC: 30.4 g/dL (ref 30.0–36.0)
MCV: 103.5 fL — ABNORMAL HIGH (ref 80.0–100.0)
Platelets: 102 10*3/uL — ABNORMAL LOW (ref 150–400)
RBC: 3.4 MIL/uL — ABNORMAL LOW (ref 4.22–5.81)
RDW: 24.1 % — ABNORMAL HIGH (ref 11.5–15.5)
WBC: 4.8 10*3/uL (ref 4.0–10.5)
nRBC: 1.7 % — ABNORMAL HIGH (ref 0.0–0.2)

## 2021-11-12 LAB — CBG MONITORING, ED
Glucose-Capillary: 115 mg/dL — ABNORMAL HIGH (ref 70–99)
Glucose-Capillary: 127 mg/dL — ABNORMAL HIGH (ref 70–99)

## 2021-11-12 MED ORDER — ZINC OXIDE 40 % EX OINT
TOPICAL_OINTMENT | Freq: Three times a day (TID) | CUTANEOUS | Status: DC
  Filled 2021-11-12: qty 113

## 2021-11-12 MED ORDER — METHOCARBAMOL 500 MG PO TABS
500.0000 mg | ORAL_TABLET | Freq: Three times a day (TID) | ORAL | Status: DC
  Administered 2021-11-12 – 2021-11-14 (×5): 500 mg via ORAL
  Filled 2021-11-12 (×7): qty 1

## 2021-11-12 MED ORDER — BUMETANIDE 1 MG PO TABS
4.0000 mg | ORAL_TABLET | Freq: Two times a day (BID) | ORAL | Status: DC
  Administered 2021-11-12 – 2021-11-13 (×3): 4 mg via ORAL
  Filled 2021-11-12 (×5): qty 4

## 2021-11-12 MED ORDER — NYSTATIN 100000 UNIT/GM EX POWD
Freq: Three times a day (TID) | CUTANEOUS | Status: DC
  Filled 2021-11-12 (×2): qty 15

## 2021-11-12 MED ORDER — SODIUM CHLORIDE 0.9 % IV SOLN: INTRAVENOUS | Status: DC

## 2021-11-12 MED ORDER — OXYCODONE HCL 5 MG PO TABS
5.0000 mg | ORAL_TABLET | Freq: Four times a day (QID) | ORAL | Status: DC | PRN
  Administered 2021-11-12: 5 mg via ORAL
  Filled 2021-11-12: qty 1

## 2021-11-12 NOTE — Progress Notes (Signed)
[name]  MRN: [id number]  DOB/AGE: May 13, 1965 56 y.o.  Primary Care Physician:[name], [name], DO  Admit date: 11/10/2021  Chief Complaint:  Chief Complaint  Patient presents with   Diarrhea   Wound Check    S-Pt presented on  11/10/2021 with  Chief Complaint  Patient presents with   Diarrhea   Wound Check  . Patient is a 56 year old African-American male with a past medical history of morbid obesity, hyperlipidemia, hypertension, diabetes mellitus type 2, GERD who was asked to come to the ER with chief complaint of diarrhea.  History of present illness dates back to 2 weeks ago when patient started having diarrhea.  Patient also gives a history of nausea and vomiting. Patient also gives a history of fall. Patient went to see his primary care yesterday and was requested to come to the ER. Upon evaluation in the ER patient was found to have serum sodium 123, BUN of 104 and serum creatinine of 8.  Patient was admitted for further care.   Nephrology was consulted for acute kidney injury.Patient was seen today in the ER Patient is well-known to our practice as patient does see Dr. Candiss Norse now, in the past patient has been followed by Dr. Juleen China.   Patient was seen in the ER. Patient offers no new specific physical complaints. I then again discussed with the patient about his kidney related issues. -I discussed the patient about his low GFR and possible need for renal placement therapy. Patient informed me in  Again clear terms that he does not wish for dialysis at any cost.   Patient then informed me that in the past he has had multiple episodes where his kidney function has gone down and usually recovered.  Patient was not willing to go for renal placement therapy .   Medications   aspirin  81 mg Oral Daily   atorvastatin  20 mg Oral Daily   heparin  5,000 Units Subcutaneous Q8H   insulin aspart  0-5 Units Subcutaneous QHS   insulin aspart  0-9 Units  Subcutaneous TID WC   lidocaine  1 patch Transdermal Q24H   pantoprazole  80 mg Oral BID         VFI:EPPIR from the symptoms mentioned above,there are no other symptoms referable to all systems reviewed.  Physical Exam: Vital signs in last 24 hours: Temp:  [97.4 F (36.3 C)-98 F (36.7 C)] 97.6 F (36.4 C) (10/18 0320) Pulse Rate:  [74-79] 79 (10/18 0320) Resp:  [15-20] 20 (10/18 0320) BP: (111-128)/(70-86) 119/75 (10/18 0320) SpO2:  [96 %-98 %] 98 % (10/18 0320) Weight:  [153.8 kg] 153.8 kg (10/17 1119) Weight change:     Intake/Output from previous day: No intake/output data recorded. No intake/output data recorded.   Physical Exam:  General- pt is awake,alert, oriented to time place and person  HEENT-head is atraumatic normocephalic, sclera is muddy  Resp- No acute REsp distress, Decreased breath sounds at bases  CVS- S1S2 regular in rate and rhythm  GIT- BS+, soft, Non tender , Non distended, Morbidly obese  EXT- Chronic venous stasis changes,  No Cyanosis   Lab Results:  CBC  Recent Labs    11/11/21 0859 11/12/21 0441  WBC 4.5 4.8  HGB 10.7* 10.7*  HCT 36.2* 35.2*  PLT 96* 102*    BMET  Recent Labs    11/11/21 0859 11/12/21 0441  NA 127* 131*  K 3.7 3.9  CL 87* 92*  CO2 29 30  GLUCOSE 146*  102*  BUN 112* 115*  CREATININE 8.15* 8.26*  CALCIUM 8.1* 8.1*      Most recent Creatinine trend  Lab Results  Component Value Date   CREATININE 8.26 (H) 11/12/2021   CREATININE 8.15 (H) 11/11/2021   CREATININE 8.22 (H) 11/10/2021      MICRO   Recent Results (from the past 240 hour(s))  Gastrointestinal Panel by PCR , Stool     Status: None   Collection Time: 11/10/21 11:03 PM   Specimen: Stool  Result Value Ref Range Status   Campylobacter species NOT DETECTED NOT DETECTED Final   Plesimonas shigelloides NOT DETECTED NOT DETECTED Final   Salmonella species NOT DETECTED NOT DETECTED Final   Yersinia enterocolitica NOT DETECTED  NOT DETECTED Final   Vibrio species NOT DETECTED NOT DETECTED Final   Vibrio cholerae NOT DETECTED NOT DETECTED Final   Enteroaggregative E coli (EAEC) NOT DETECTED NOT DETECTED Final   Enteropathogenic E coli (EPEC) NOT DETECTED NOT DETECTED Final   Enterotoxigenic E coli (ETEC) NOT DETECTED NOT DETECTED Final   Shiga like toxin producing E coli (STEC) NOT DETECTED NOT DETECTED Final   Shigella/Enteroinvasive E coli (EIEC) NOT DETECTED NOT DETECTED Final   Cryptosporidium NOT DETECTED NOT DETECTED Final   Cyclospora cayetanensis NOT DETECTED NOT DETECTED Final   Entamoeba histolytica NOT DETECTED NOT DETECTED Final   Giardia lamblia NOT DETECTED NOT DETECTED Final   Adenovirus F40/41 NOT DETECTED NOT DETECTED Final   Astrovirus NOT DETECTED NOT DETECTED Final   Norovirus GI/GII NOT DETECTED NOT DETECTED Final   Rotavirus A NOT DETECTED NOT DETECTED Final   Sapovirus (I, II, IV, and V) NOT DETECTED NOT DETECTED Final    Comment: Performed at [facility], [address] [place], [place] [address]  C Difficile Quick Screen w PCR reflex     Status: None   Collection Time: 11/10/21 11:03 PM   Specimen: Stool  Result Value Ref Range Status   C Diff antigen NEGATIVE NEGATIVE Final   C Diff toxin NEGATIVE NEGATIVE Final   C Diff interpretation No C. difficile detected.  Final    Comment: Performed at [facility], [address] [place], [place] [address]  SARS Coronavirus 2 by RT PCR (hospital order, performed in [facility] hospital lab) *cepheid single result test* Anterior Nasal Swab     Status: None   Collection Time: 11/10/21 11:03 PM   Specimen: Anterior Nasal Swab  Result Value Ref Range Status   SARS Coronavirus 2 by RT PCR NEGATIVE NEGATIVE Final    Comment: (NOTE) SARS-CoV-2 target nucleic acids are NOT DETECTED.  The SARS-CoV-2 RNA is generally detectable in upper and lower respiratory specimens during the acute phase of infection. The  lowest concentration of SARS-CoV-2 viral copies this assay can detect is 250 copies / mL. A negative result does not preclude SARS-CoV-2 infection and should not be used as the sole basis for treatment or other patient management decisions.  A negative result may occur with improper specimen collection / handling, submission of specimen other than nasopharyngeal swab, presence of viral mutation(s) within the areas targeted by this assay, and inadequate number of viral copies (<250 copies / mL). A negative result must be combined with clinical observations, patient history, and epidemiological information.  Fact Sheet for Patients:   https://www.patel.info/  Fact Sheet for Healthcare Providers: https://hall.com/  This test is not yet approved or  cleared by the [place] FDA and has been authorized for detection and/or diagnosis  of SARS-CoV-2 by FDA under an Emergency Use Authorization (EUA).  This EUA will remain in effect (meaning this test can be used) for the duration of the COVID-19 declaration under Section 564(b)(1) of the Act, 21 U.S.C. section 360bbb-3(b)(1), unless the authorization is terminated or revoked sooner.  Performed at [facility], [address], [place], [place] [address]     Renal ultrasound done on November 11, 2021 was reviewed  Right Kidney:   Renal measurements: 10.9 x 6.0 x 5.2 cm = volume: 177 mL. Echogenicity within normal limits. No mass or hydronephrosis visualized.   Left Kidney:   Renal measurements: 10.4 x 7.0 x 6.1 cm = volume: 233 mL. Echogenicity within normal limits. No mass or hydronephrosis visualized.   Bladder:   Appears normal for degree of bladder distention.   Other:   Incidental note of sludge and stones within the gallbladder.   IMPRESSION: Unremarkable ultrasound of the kidneys.   Cholelithiasis.  Chest x-ray done on November 10, 2021 was  reviewed FINDINGS: Cardiomegaly. Pulmonary vascular congestion. Diffuse bilateral interstitial prominence. No large pleural fluid collection. No pneumothorax.   IMPRESSION: Cardiomegaly with pulmonary vascular congestion and interstitial edema.   Impression:   1)Renal    Patient has acute kidney injury Patient has AKI secondary to ATN AKI with some component of prerenal issues as patient was having decreased oral intake Patient renal ultrasound does not show any  obstruction Patient has AKI on CKD Patient has baseline CKD stage IV Patient has CKD stage IV secondary diabetes mellitus Patient CKD has been marked with multiple episodes of AKI    Creatinine trend 2023 2.1--2.3 and now at 8.2 2022 3.0--5.0-AKI 2021 2.5--4.2-AKI 2020 2019 2.0 2018 2.7--3.7 2017 1.6--2.1 2016 1.3--1.6 3.0  2)HTN    Blood pressure is stable    3)Anemia of chronic disease     Latest Ref Rng & Units 11/12/2021    4:41 AM 11/11/2021    8:59 AM 11/10/2021   11:03 PM  CBC  WBC 4.0 - 10.5 K/uL 4.8  4.5  4.4   Hemoglobin 13.0 - 17.0 g/dL 10.7  10.7  10.9   Hematocrit 39.0 - 52.0 % 35.2  36.2  36.8   Platelets 150 - 400 K/uL 102  96  90        HGb at goal (9--11)   4) Secondary hyperparathyroidism -CKD Mineral-Bone Disorder    Lab Results  Component Value Date   CALCIUM 8.1 (L) 11/12/2021    Secondary Hyperparathyroidism  present . Patient has history of secondary hyperparathyroidism Patient intact PTH has been elevated going back to 2020  Phosphorus at goal.   5)Diabetes mellitus type 2 Being followed by the primary team    6) Electrolytes      Latest Ref Rng & Units 11/12/2021    4:41 AM 11/11/2021    8:59 AM 11/10/2021   11:03 PM  BMP  Glucose 70 - 99 mg/dL 102  146  102   BUN 6 - 20 mg/dL 115  112  101   Creatinine 0.61 - 1.24 mg/dL 8.26  8.15  8.22   Sodium 135 - 145 mmol/L 131  127  125   Potassium 3.5 - 5.1 mmol/L 3.9  3.7  3.7   Chloride 98 - 111  mmol/L 92  87  83   CO2 22 - 32 mmol/L 30  29  30    Calcium 8.9 - 10.3 mg/dL 8.1  8.1  8.0      Sodium  Hyponatremia Patient sodium is improving .   Potassium Normokalemic    7)Acid base    Co2 at goal  8) Right-sided heart failure  Patient 2D echo done and 2020 had shown  Right Ventricle: The right ventricle has mildly reduced systolic function.  The cavity in severely enlarged in size. There is no increase in right  ventricular wall thickness. Right ventricular systolic pressure could not  be assessed.   Plan:  Patient is in fluid overload With patient's weight, chronic venous stasis changes and a history of right-sided heart failure it is difficult to assess patient's volume. We will start patient on diuretics  Patient is not agreeable for renal replacement therapy at this time      Sheelah Ritacco s [facility] 11/12/2021, 7:59 AM

## 2021-11-12 NOTE — Evaluation (Signed)
Physical Therapy Evaluation Patient Details Name: [name] MRN: [id number] DOB: [birth year] Today's Date: 11/12/2021  History of Present Illness  [name] is a 9yoM who comes to [facility] on 10/16 c abdnomal lab values, noted CK: 369, hyponatremia, 2 weeks of diarrhea and annorrexia.  PMH: HLD, HTN, IDDM, GERD. Mother recently called PCP "concerned about patient welfare- patient has multiple health concerns- weight gain, swelling, uncontrolled diabetes, and states he wants to kill himself." Also of note recent fall on October 10th and difficulty getting off ground.  Clinical Impression  Pt admitted c above Dx. Pt shows functional limitations due to the deficits listed below (see "PT Problem List"). Patient agreeable to PT evaluation. PLOF and home setup obtained. Pt able to perform bed mobility with modA physical assistance, transfers to standing without device, then AMB c RW to hallway bathroom. These all appear to require either greater than typical effort or greater than typical equipment. Pt is not very forthcoming about any acute impairments in strength, mobility, balance, strikes me as someone who greatly values his independence in his identity, repeatedly champions his lack of need of assistance during questioning even when an off topic response. Patient's assessment reveals acute need for additional person for safety and/or physical assistance to complete their typical ADL. At baseline, the patient is able to perform ADL with modified independence. Patient will benefit from skilled PT intervention to maximize independence and safety in mobility required for basic ADL performance at discharge.         Recommendations for follow up therapy are one component of a multi-disciplinary discharge planning process, led by the attending physician.  Recommendations may be updated based on patient status, additional functional criteria and insurance authorization.  Follow Up Recommendations Home  health PT      Assistance Recommended at Discharge Set up Supervision/Assistance  Patient can return home with the following  Assist for transportation    Equipment Recommendations None recommended by PT  Recommendations for Other Services  OT consult    Functional Status Assessment Patient has had a recent decline in their functional status and demonstrates the ability to make significant improvements in function in a reasonable and predictable amount of time.     Precautions / Restrictions Precautions Precautions: Fall Restrictions Weight Bearing Restrictions: No      Mobility  Bed Mobility Overal bed mobility: Needs Assistance Bed Mobility: Supine to Sit     Supine to sit: Mod assist     General bed mobility comments: from a flat bed, cannot get up due to core weaness, but modA with hand held assist can pull self to EOB; no LOB at EOB, denies dizziness    Transfers Overall transfer level: Needs assistance Equipment used: None Transfers: Sit to/from Stand Sit to Stand: Modified independent (Device/Increase time)                Ambulation/Gait Ambulation/Gait assistance: Supervision Gait Distance (Feet): 50 Feet Assistive device: Rolling walker (2 wheels) Gait Pattern/deviations: Step-through pattern  Appears to easily been able to AMB farther, distance limited by urgency to get to BR. Reports to have been AMB to BR with NSG today and yesterday.      General Gait Details: AMB to BR due to urgency to void bowel  Stairs            Wheelchair Mobility    Modified Rankin (Stroke Patients Only)       Balance  Pertinent Vitals/Pain Pain Assessment Pain Assessment: 0-10 (15/10) Pain Location: buttocks Pain Intervention(s): Limited activity within patient's tolerance, Monitored during session    [name] expects to be discharged to:: Private residence Living  Arrangements: Children (works during day) Available Help at Discharge: Family Type of Home: Apartment Home Access: Level entry       Home Layout: One level Home Equipment: Rollator (4 wheels)      Prior Function Prior Level of Function : Independent/Modified Independent;Driving             Mobility Comments: retired       Journalist, newspaper        Extremity/Trunk Assessment                Communication      Cognition                                                General Comments      Exercises     Assessment/Plan    PT Assessment Patient needs continued PT services  PT Problem List Decreased strength;Decreased range of motion;Decreased activity tolerance;Decreased balance;Decreased mobility;Decreased coordination;Decreased safety awareness;Decreased knowledge of precautions       PT Treatment Interventions DME instruction;Balance training;Gait training;Stair training;Functional mobility training;Therapeutic activities;Therapeutic exercise;Patient/family education    PT Goals (Current goals can be found in the Care Plan section)  Acute Rehab PT Goals Patient Stated Goal: return to home PT Goal Formulation: With patient Time For Goal Achievement: 11/26/21 Potential to Achieve Goals: Good    Frequency Min 2X/week     Co-evaluation               AM-PAC PT "6 Clicks" Mobility  Outcome Measure Help needed turning from your back to your side while in a flat bed without using bedrails?: A Lot Help needed moving from lying on your back to sitting on the side of a flat bed without using bedrails?: A Lot Help needed moving to and from a bed to a chair (including a wheelchair)?: A Little Help needed standing up from a chair using your arms (e.g., wheelchair or bedside chair)?: A Little Help needed to walk in hospital room?: A Little Help needed climbing 3-5 steps with a railing? : A Lot 6 Click Score: 15    End of Session  Equipment Utilized During Treatment: Oxygen Activity Tolerance: Patient tolerated treatment well;No increased pain Patient left: with call bell/phone within reach;with nursing/sitter in room (in hallway bathroom on toilet, RN aware) Nurse Communication: Mobility status;Other (comment) (desaturation on room air) PT Visit Diagnosis: Other abnormalities of gait and mobility (R26.89);Unsteadiness on feet (R26.81);Difficulty in walking, not elsewhere classified (R26.2)    Time: 9371-6967 PT Time Calculation (min) (ACUTE ONLY): 22 min   Charges:   PT Evaluation $PT Eval Moderate Complexity: 1 Mod         10:27 AM, 11/12/21 Etta Grandchild, PT, DPT Physical Therapist - [facility]  [phone] ([place])   Georgean Spainhower C 11/12/2021, 10:23 AM

## 2021-11-12 NOTE — Progress Notes (Signed)
PROGRESS NOTE    [name]  QBH:[id number] DOB: Dec 25, 1965 DOA: 11/10/2021 PCP: [name], DO    Brief Narrative:  56 year old male morbid obesity, hyperlipidemia, hypertension, insulin-dependent diabetes mellitus, GERD, who presents emergency department for chief concerns of abnormal labs.  Patient has been on gentle intravenous fluids given volume overloaded state.  This does not result in an improvement in serum creatinine.  Discussed with nephrology on 10/18.  Patient is adamantly refusing renal replacement therapy.  Requesting his diuretics.  We will attempt to treat as cardiorenal syndrome and restart home diuretics.  Monitor volume status carefully.  Assessment & Plan:   Principal Problem:   Acute kidney injury superimposed on CKD [facility]) Active Problems:   Morbid obesity with BMI of 45.0-49.9, adult (HCC)   Benign hypertension with CKD (chronic kidney disease) stage IV (HCC)   Proteinuria   Type 2 diabetes mellitus, controlled, with renal complications (HCC)   Secondary renal hyperparathyroidism (HCC)   Tobacco abuse, episodic   Hyperlipidemia associated with type 2 diabetes mellitus (HCC)   Current tobacco use   GERD (gastroesophageal reflux disease)   Chronic pain syndrome   Diarrhea   Volume overload   Hypotension   Hyponatremia   Thrombocytopenia (HCC)   AKI (acute kidney injury) ([place])  * Acute kidney injury superimposed on CKD ([place]) Acute kidney injury on CKD stage IIIb.  Baseline creatinine around 2.1 with a GFR of 37.  Most recent creatinine 8.15.  Patient declined dialysis.  Also endorses poor sleep in the hospital.  Discussed with nephrology.  At this point patient has not responded to intravenous fluid resuscitation.  He is refusing renal replacement therapy.  We will attempt to optimize serum creatinine with use of diuretics.  Bumex 4 mg twice daily restarted.   Thrombocytopenia ([place]) Continue to monitor.  Unclear etiology.  Hepatitis  panel unrevealing.   Hyponatremia Secondary to dehydration and poor p.o. intake.  IV fluids discontinued.  Sodium back in reference range   Hypotension Hold antihypertensive medications currently   Volume overload Diuretics restarted 10/18   Diarrhea Stool studies negative.  Okay for heart healthy diet with fluid restriction   Chronic pain syndrome Tramadol is not a good medication with acute kidney injury.  Careful with this medication.   GERD (gastroesophageal reflux disease) - PPI   Current tobacco use - PRN nicotine patch   Hyperlipidemia associated with type 2 diabetes mellitus (HCC) Continue atorvastatin   Type 2 diabetes mellitus, controlled, with renal complications (HCC) Holding long-acting insulin and using sliding scale only.   Benign hypertension with CKD (chronic kidney disease) stage IV (HCC) Holding Coreg   Morbid obesity with BMI of 45.0-49.9, adult ([place]) - Patient has BMI of 47.29 with current height and weight in computer.  This complicates overall care and prognosis.  DVT prophylaxis: SQ heparin Code Status: Full Family Communication: None today Disposition Plan: Status is: Inpatient Remains inpatient appropriate because: Severe AKI on CKD   Level of care: Med-Surg  Consultants:  Nephrology  Procedures:  None  Antimicrobials: None   Subjective: Seen and examined.  Resting in bed.  Endorses poor sleep.  Endorses low back pain.  Request to get out of bed.  Request diuretics.  Request advancement of diet.  Objective: Vitals:   11/12/21 0822 11/12/21 1145 11/12/21 1148 11/12/21 1220  BP: 135/82  115/77   Pulse: 76 82 80   Resp: 20 20 19    Temp: 98.2 F (36.8 C)   97.8 F (  36.6 C)  TempSrc: Oral   Oral  SpO2: 97% (!) 84% 95%   Weight:      Height:       No intake or output data in the 24 hours ending 11/12/21 1349 Filed Weights   11/11/21 1119  Weight: (!) 153.8 kg    Examination:  General exam: Appears calm and comfortable   Respiratory system: Bibasilar crackles.  Normal work of breathing.  Room air Cardiovascular system: S1-S2, RRR, no murmurs, 2+ pitting edema BLE Gastrointestinal system: Obese, NT/ND, normal bowel sounds Central nervous system: Alert and oriented. No focal neurological deficits. Extremities: Symmetric 5 x 5 power. Skin: No rashes, lesions or ulcers Psychiatry: Judgement and insight appear normal. Mood & affect appropriate.     Data Reviewed: I have personally reviewed following labs and imaging studies  CBC: Recent Labs  Lab 11/10/21 1404 11/10/21 2303 11/11/21 0859 11/12/21 0441  WBC 4.4 4.4 4.5 4.8  HGB 11.1* 10.9* 10.7* 10.7*  HCT 36.1* 36.8* 36.2* 35.2*  MCV 99.7 101.7* 102.0* 103.5*  PLT 90* 90* 96* 585*   Basic Metabolic Panel: Recent Labs  Lab 11/10/21 1404 11/10/21 2303 11/11/21 0859 11/12/21 0441  NA 123* 125* 127* 131*  K 3.7 3.7 3.7 3.9  CL 83* 83* 87* 92*  CO2 29 30 29 30   GLUCOSE 77 102* 146* 102*  BUN 104* 101* 112* 115*  CREATININE 8.08* 8.22* 8.15* 8.26*  CALCIUM 7.9* 8.0* 8.1* 8.1*   GFR: Estimated Creatinine Clearance: 15.4 mL/min (A) (by C-G formula based on SCr of 8.26 mg/dL (H)). Liver Function Tests: Recent Labs  Lab 11/10/21 1404  AST 61*  ALT 55*  ALKPHOS 468*  BILITOT 3.4*  PROT 5.8*  ALBUMIN 2.1*   Recent Labs  Lab 11/10/21 1404  LIPASE 97*   Recent Labs  Lab 11/10/21 2303  AMMONIA 52*   Coagulation Profile: No results for input(s): "INR", "PROTIME" in the last 168 hours. Cardiac Enzymes: Recent Labs  Lab 11/10/21 1404  CKTOTAL 369   BNP (last 3 results) No results for input(s): "PROBNP" in the last 8760 hours. HbA1C: No results for input(s): "HGBA1C" in the last 72 hours. CBG: Recent Labs  Lab 11/11/21 1136 11/11/21 1610 11/11/21 2251 11/12/21 0759 11/12/21 1140  GLUCAP 109* 98 102* 115* 127*   Lipid Profile: No results for input(s): "CHOL", "HDL", "LDLCALC", "TRIG", "CHOLHDL", "LDLDIRECT" in the last  72 hours. Thyroid Function Tests: No results for input(s): "TSH", "T4TOTAL", "FREET4", "T3FREE", "THYROIDAB" in the last 72 hours. Anemia Panel: No results for input(s): "VITAMINB12", "FOLATE", "FERRITIN", "TIBC", "IRON", "RETICCTPCT" in the last 72 hours. Sepsis Labs: No results for input(s): "PROCALCITON", "LATICACIDVEN" in the last 168 hours.  Recent Results (from the past 240 hour(s))  Gastrointestinal Panel by PCR , Stool     Status: None   Collection Time: 11/10/21 11:03 PM   Specimen: Stool  Result Value Ref Range Status   Campylobacter species NOT DETECTED NOT DETECTED Final   Plesimonas shigelloides NOT DETECTED NOT DETECTED Final   Salmonella species NOT DETECTED NOT DETECTED Final   Yersinia enterocolitica NOT DETECTED NOT DETECTED Final   Vibrio species NOT DETECTED NOT DETECTED Final   Vibrio cholerae NOT DETECTED NOT DETECTED Final   Enteroaggregative E coli (EAEC) NOT DETECTED NOT DETECTED Final   Enteropathogenic E coli (EPEC) NOT DETECTED NOT DETECTED Final   Enterotoxigenic E coli (ETEC) NOT DETECTED NOT DETECTED Final   Shiga like toxin producing E coli (STEC) NOT DETECTED NOT DETECTED Final  Shigella/Enteroinvasive E coli (EIEC) NOT DETECTED NOT DETECTED Final   Cryptosporidium NOT DETECTED NOT DETECTED Final   Cyclospora cayetanensis NOT DETECTED NOT DETECTED Final   Entamoeba histolytica NOT DETECTED NOT DETECTED Final   Giardia lamblia NOT DETECTED NOT DETECTED Final   Adenovirus F40/41 NOT DETECTED NOT DETECTED Final   Astrovirus NOT DETECTED NOT DETECTED Final   Norovirus GI/GII NOT DETECTED NOT DETECTED Final   Rotavirus A NOT DETECTED NOT DETECTED Final   Sapovirus (I, II, IV, and V) NOT DETECTED NOT DETECTED Final    Comment: Performed at [facility], [address] [place], [place] [address]  C Difficile Quick Screen w PCR reflex     Status: None   Collection Time: 11/10/21 11:03 PM   Specimen: Stool  Result Value Ref Range Status   C  Diff antigen NEGATIVE NEGATIVE Final   C Diff toxin NEGATIVE NEGATIVE Final   C Diff interpretation No C. difficile detected.  Final    Comment: Performed at [facility], [address] [place], [place] [address]  SARS Coronavirus 2 by RT PCR (hospital order, performed in [facility] hospital lab) *cepheid single result test* Anterior Nasal Swab     Status: None   Collection Time: 11/10/21 11:03 PM   Specimen: Anterior Nasal Swab  Result Value Ref Range Status   SARS Coronavirus 2 by RT PCR NEGATIVE NEGATIVE Final    Comment: (NOTE) SARS-CoV-2 target nucleic acids are NOT DETECTED.  The SARS-CoV-2 RNA is generally detectable in upper and lower respiratory specimens during the acute phase of infection. The lowest concentration of SARS-CoV-2 viral copies this assay can detect is 250 copies / mL. A negative result does not preclude SARS-CoV-2 infection and should not be used as the sole basis for treatment or other patient management decisions.  A negative result may occur with improper specimen collection / handling, submission of specimen other than nasopharyngeal swab, presence of viral mutation(s) within the areas targeted by this assay, and inadequate number of viral copies (<250 copies / mL). A negative result must be combined with clinical observations, patient history, and epidemiological information.  Fact Sheet for Patients:   https://www.patel.info/  Fact Sheet for Healthcare Providers: https://hall.com/  This test is not yet approved or  cleared by the [place] FDA and has been authorized for detection and/or diagnosis of SARS-CoV-2 by FDA under an Emergency Use Authorization (EUA).  This EUA will remain in effect (meaning this test can be used) for the duration of the COVID-19 declaration under Section 564(b)(1) of the Act, 21 U.S.C. section 360bbb-3(b)(1), unless the authorization is terminated or revoked  sooner.  Performed at [facility] [place], [address] [place], [place] [address]          Radiology Studies: ECHOCARDIOGRAM COMPLETE  Result Date: 11/11/2021    ECHOCARDIOGRAM REPORT   Patient Name:   [name] Date of Exam: 11/11/2021 Medical Rec #:  [id number]        Height:       71.0 in Accession #:    [id number]       Weight:       339.1 lb Date of Birth:  Dec 09, 1965       BSA:          2.639 m Patient Age:    87 years         BP:           111/73 mmHg Patient Gender: M  HR:           76 bpm. Exam Location:  [facility] Procedure: 2D Echo, Limited Color Doppler and Cardiac Doppler Indications:     R06.00 Dyspnea  History:         Patient has prior history of Echocardiogram examinations, most                  recent 04/30/2018. Risk Factors:Hypertension.  Sonographer:     Charmayne Sheer Referring Phys:  [id number] AMY N COX Diagnosing Phys: Serafina Royals MD  Sonographer Comments: No apical window. Image acquisition challenging due to patient body habitus and Image acquisition challenging due to uncooperative patient. IMPRESSIONS  1. Left ventricular ejection fraction, by estimation, is 60 to 65%. The left ventricle has normal function. The left ventricle has no regional wall motion abnormalities. Left ventricular diastolic parameters were normal.  2. Right ventricular systolic function is normal. The right ventricular size is normal.  3. The mitral valve is normal in structure. Trivial mitral valve regurgitation.  4. The aortic valve is normal in structure. Aortic valve regurgitation is trivial. FINDINGS  Left Ventricle: Left ventricular ejection fraction, by estimation, is 60 to 65%. The left ventricle has normal function. The left ventricle has no regional wall motion abnormalities. The left ventricular internal cavity size was normal in size. There is  no left ventricular hypertrophy. Left ventricular diastolic parameters were normal. Right Ventricle: The right ventricular size is  normal. No increase in right ventricular wall thickness. Right ventricular systolic function is normal. Left Atrium: Left atrial size was normal in size. Right Atrium: Right atrial size was normal in size. Pericardium: There is no evidence of pericardial effusion. Mitral Valve: The mitral valve is normal in structure. Trivial mitral valve regurgitation. Tricuspid Valve: The tricuspid valve is normal in structure. Tricuspid valve regurgitation is trivial. Aortic Valve: The aortic valve is normal in structure. Aortic valve regurgitation is trivial. Pulmonic Valve: The pulmonic valve was normal in structure. Pulmonic valve regurgitation is trivial. Aorta: The aortic root and ascending aorta are structurally normal, with no evidence of dilitation. IAS/Shunts: No atrial level shunt detected by color flow Doppler.  LEFT VENTRICLE PLAX 2D LVIDd:         5.30 cm LVIDs:         4.50 cm LV PW:         1.80 cm LV IVS:        1.30 cm LVOT diam:     2.80 cm LVOT Area:     6.16 cm                        PULMONIC VALVE AORTA                 PV Vmax:          1.12 m/s Ao Root diam: 4.20 cm PV Vmean:         73.000 cm/s                       PV VTI:           0.182 m                       PV Peak grad:     5.0 mmHg                       PV Mean grad:  3.0 mmHg                       PR End Diast Vel: 6.15 msec  MITRAL VALVE MV Area (PHT): 4.80 cm    SHUNTS MV Decel Time: 158 msec    Systemic Diam: 2.80 cm MV E velocity: 66.00 cm/s MV A velocity: 70.70 cm/s MV E/A ratio:  0.93 Serafina Royals MD Electronically signed by Serafina Royals MD Signature Date/Time: 11/11/2021/5:05:52 PM    Final    US RENAL  Result Date: 11/11/2021 CLINICAL DATA:  Acute kidney injury EXAM: RENAL / URINARY TRACT ULTRASOUND COMPLETE COMPARISON:  Ultrasound 02/28/2018 FINDINGS: Right Kidney: Renal measurements: 10.9 x 6.0 x 5.2 cm = volume: 177 mL. Echogenicity within normal limits. No mass or hydronephrosis visualized. Left Kidney: Renal measurements:  10.4 x 7.0 x 6.1 cm = volume: 233 mL. Echogenicity within normal limits. No mass or hydronephrosis visualized. Bladder: Appears normal for degree of bladder distention. Other: Incidental note of sludge and stones within the gallbladder. IMPRESSION: Unremarkable ultrasound of the kidneys. Cholelithiasis. Electronically Signed   By: Placido Sou M.D.   On: 11/11/2021 00:21   DG Chest Portable 1 View  Result Date: 11/10/2021 CLINICAL DATA:  Crackles EXAM: PORTABLE CHEST 1 VIEW COMPARISON:  03/02/2018 FINDINGS: Cardiomegaly. Pulmonary vascular congestion. Diffuse bilateral interstitial prominence. No large pleural fluid collection. No pneumothorax. IMPRESSION: Cardiomegaly with pulmonary vascular congestion and interstitial edema. Electronically Signed   By: Davina Poke D.O.   On: 11/10/2021 20:36   CT Head Wo Contrast  Result Date: 11/10/2021 CLINICAL DATA:  Altered mental status, neck trauma EXAM: CT HEAD WITHOUT CONTRAST CT CERVICAL SPINE WITHOUT CONTRAST TECHNIQUE: Multidetector CT imaging of the head and cervical spine was performed following the standard protocol without intravenous contrast. Multiplanar CT image reconstructions of the cervical spine were also generated. RADIATION DOSE REDUCTION: This exam was performed according to the departmental dose-optimization program which includes automated exposure control, adjustment of the mA and/or kV according to patient size and/or use of iterative reconstruction technique. COMPARISON:  None Available. FINDINGS: CT HEAD FINDINGS Brain: No evidence of acute infarct, hemorrhage, mass, mass effect, or midline shift. No hydrocephalus or extra-axial fluid collection. Vascular: No hyperdense vessel. Skull: Normal. Negative for fracture or focal lesion. Sinuses/Orbits: Air-fluid level with bubbly fluid in the left maxillary sinus with mucosal thickening. The orbits are unremarkable. Other: The mastoid air cells are well aerated. CT CERVICAL SPINE FINDINGS  Alignment: No listhesis. Mild reversal of the normal cervical lordosis. Skull base and vertebrae: No acute fracture. No primary bone lesion or focal pathologic process. Soft tissues and spinal canal: No prevertebral fluid or swelling. No visible canal hematoma. Disc levels: Mild degenerative changes. No high-grade spinal canal stenosis. Upper chest: Minimally included in the field of view. No acute finding. Other: None. IMPRESSION: 1. No acute intracranial process. 2. No acute fracture or traumatic listhesis in the cervical spine. 3. Air-fluid level with bubbly fluid in the left maxillary sinus, which can be seen in the setting of acute sinusitis. Electronically Signed   By: Merilyn Baba M.D.   On: 11/10/2021 15:53   CT Cervical Spine Wo Contrast  Result Date: 11/10/2021 CLINICAL DATA:  Altered mental status, neck trauma EXAM: CT HEAD WITHOUT CONTRAST CT CERVICAL SPINE WITHOUT CONTRAST TECHNIQUE: Multidetector CT imaging of the head and cervical spine was performed following the standard protocol without intravenous contrast. Multiplanar CT image reconstructions of the cervical spine were also generated. RADIATION DOSE REDUCTION:  This exam was performed according to the departmental dose-optimization program which includes automated exposure control, adjustment of the mA and/or kV according to patient size and/or use of iterative reconstruction technique. COMPARISON:  None Available. FINDINGS: CT HEAD FINDINGS Brain: No evidence of acute infarct, hemorrhage, mass, mass effect, or midline shift. No hydrocephalus or extra-axial fluid collection. Vascular: No hyperdense vessel. Skull: Normal. Negative for fracture or focal lesion. Sinuses/Orbits: Air-fluid level with bubbly fluid in the left maxillary sinus with mucosal thickening. The orbits are unremarkable. Other: The mastoid air cells are well aerated. CT CERVICAL SPINE FINDINGS Alignment: No listhesis. Mild reversal of the normal cervical lordosis. Skull  base and vertebrae: No acute fracture. No primary bone lesion or focal pathologic process. Soft tissues and spinal canal: No prevertebral fluid or swelling. No visible canal hematoma. Disc levels: Mild degenerative changes. No high-grade spinal canal stenosis. Upper chest: Minimally included in the field of view. No acute finding. Other: None. IMPRESSION: 1. No acute intracranial process. 2. No acute fracture or traumatic listhesis in the cervical spine. 3. Air-fluid level with bubbly fluid in the left maxillary sinus, which can be seen in the setting of acute sinusitis. Electronically Signed   By: Merilyn Baba M.D.   On: 11/10/2021 15:53        Scheduled Meds:  aspirin  81 mg Oral Daily   atorvastatin  20 mg Oral Daily   bumetanide  4 mg Oral BID   heparin  5,000 Units Subcutaneous Q8H   insulin aspart  0-5 Units Subcutaneous QHS   insulin aspart  0-9 Units Subcutaneous TID WC   lidocaine  1 patch Transdermal Q24H   pantoprazole  80 mg Oral BID   Continuous Infusions:   LOS: 2 days     Sidney Ace, MD [facility]   If 7PM-7AM, please contact night-coverage  11/12/2021, 1:49 PM

## 2021-11-12 NOTE — Progress Notes (Signed)
Patient arrived from ER in stable condition.

## 2021-11-12 NOTE — Progress Notes (Signed)
Upon arrival Dream stated he was desiring companionship. He reports this hospital stay has "about done me in." He shared a life review. As chaplain validated experiences [place] appeared to gain joy. He shared he has a routine at home that he expects to continue and the support he receives from his daughter. He reports a strong faith as God has gotten him through being a single parent and his many days of working. Chaplain offered him prayer. He requested additional support. Chaplain will handoff to incoming chaplain for a visit on 10.19.    11/12/21 1800  Clinical Encounter Type  Visited With Patient  Visit Type Follow-up  Referral From Chaplain  Consult/Referral To Chaplain

## 2021-11-12 NOTE — Evaluation (Signed)
Occupational Therapy Evaluation Patient Details Name: [name] MRN: [id number] DOB: 11/02/1965 Today's Date: 11/12/2021   History of Present Illness [name] is a 64yoM who comes to [facility] on 10/16 c abdnomal lab values, noted CK: 369, hyponatremia, 2 weeks of diarrhea and annorrexia.  PMH: HLD, HTN, IDDM, GERD. Mother recently called PCP "concerned about patient welfare- patient has multiple health concerns- weight gain, swelling, uncontrolled diabetes, and states he wants to kill himself." Also of note recent fall on October 10th and difficulty getting off ground.   Clinical Impression   Mr [name] was seen for OT evaluation this date. Prior to hospital admission, pt was IND for mobility and ADLs. Pt lives with family in level entry apartment. Pt presents to acute OT demonstrating impaired ADL performance and functional mobility 2/2 decreased activity tolerance and functional strength/ROM/balance deficits. Pt is difficult to redirect towards tasks, intermittently tearful t/o session r/t pain - bottom noted to have sores and sacral patch applied (RN notified). Pt states that no one has helped him here "in 3 weeks" despite being admitted for 2 days only.   Pt currently requires MIN A exit bed. SBA + RW sit<>stand and side steps along bed - pt defers further mobility. Good standing tolerance with BUE support. Pt left sitting EOB, RN aware. Pt would benefit from skilled OT to address noted impairments and functional limitations (see below for any additional details). Upon hospital discharge, recommend HHOT to maximize pt safety and return to PLOF.    Recommendations for follow up therapy are one component of a multi-disciplinary discharge planning process, led by the attending physician.  Recommendations may be updated based on patient status, additional functional criteria and insurance authorization.   Follow Up Recommendations  Home health OT    Assistance Recommended at Discharge  Intermittent Supervision/Assistance  Patient can return home with the following A little help with walking and/or transfers;A lot of help with bathing/dressing/bathroom;Help with stairs or ramp for entrance    Functional Status Assessment  Patient has had a recent decline in their functional status and demonstrates the ability to make significant improvements in function in a reasonable and predictable amount of time.  Equipment Recommendations  BSC/3in1    Recommendations for Other Services       Precautions / Restrictions Precautions Precautions: Fall Restrictions Weight Bearing Restrictions: No      Mobility Bed Mobility Overal bed mobility: Needs Assistance Bed Mobility: Supine to Sit     Supine to sit: Min assist     General bed mobility comments: assist for trunk    Transfers Overall transfer level: Needs assistance Equipment used: Rolling walker (2 wheels) Transfers: Sit to/from Stand Sit to Stand: Supervision                  Balance Overall balance assessment: Needs assistance Sitting-balance support: No upper extremity supported, Feet supported Sitting balance-Leahy Scale: Normal     Standing balance support: Bilateral upper extremity supported Standing balance-Leahy Scale: Good                             ADL either performed or assessed with clinical judgement   ADL Overall ADL's : Needs assistance/impaired                                       General ADL Comments: SBA +  RW for simulated BSC t/f. Good standing tolerance with BUE support. Anticipate assist for LBD threading      Pertinent Vitals/Pain Pain Assessment Pain Assessment: 0-10 Pain Score: 10-Worst pain ever Pain Location: buttocks Pain Descriptors / Indicators: Aching, Discomfort, Dull, Grimacing, Crying Pain Intervention(s): Limited activity within patient's tolerance, Repositioned, Other (comment) (sacral pad applied)     Hand Dominance      Extremity/Trunk Assessment Upper Extremity Assessment Upper Extremity Assessment: Overall WFL for tasks assessed   Lower Extremity Assessment Lower Extremity Assessment: Generalized weakness       Communication Communication Communication: No difficulties   Cognition Arousal/Alertness: Awake/alert Behavior During Therapy: Agitated Overall Cognitive Status: No family/caregiver present to determine baseline cognitive functioning                                 General Comments: pt is hard to redirect to task and has poor insight into situation - pt states "no one has helped me in 3 weeks that i've been here" despite being admitted for 2 days and pt does not change statement when corrected.                Home Living Family/patient expects to be discharged to:: Private residence Living Arrangements: Children (works during the day) Available Help at Discharge: Family Type of Home: Apartment Home Access: Level entry     Home Layout: One level               [place]: Rollator (4 wheels)          Prior Functioning/Environment Prior Level of Function : Independent/Modified Independent;Driving                        OT Problem List: Decreased range of motion;Decreased activity tolerance;Decreased safety awareness;Decreased strength      OT Treatment/Interventions: Self-care/ADL training;Therapeutic exercise;Energy conservation;DME and/or AE instruction;Therapeutic activities;Patient/family education;Balance training    OT Goals(Current goals can be found in the care plan section) Acute Rehab OT Goals Patient Stated Goal: to go home OT Goal Formulation: With patient Time For Goal Achievement: 11/26/21 Potential to Achieve Goals: Good ADL Goals Pt Will Perform Grooming: with modified independence;standing Pt Will Perform Lower Body Dressing: with modified independence;sit to/from stand Pt Will Transfer to Toilet: with modified  independence;regular height toilet;ambulating  OT Frequency: Min 1X/week    Co-evaluation              AM-PAC OT "6 Clicks" Daily Activity     Outcome Measure Help from another person eating meals?: None Help from another person taking care of personal grooming?: A Little Help from another person toileting, which includes using toliet, bedpan, or urinal?: A Little Help from another person bathing (including washing, rinsing, drying)?: A Little Help from another person to put on and taking off regular upper body clothing?: None Help from another person to put on and taking off regular lower body clothing?: A Lot 6 Click Score: 19   End of Session Equipment Utilized During Treatment: Rolling walker (2 wheels);Oxygen Nurse Communication: Mobility status  Activity Tolerance: Patient tolerated treatment well Patient left: in bed;with call bell/phone within reach  OT Visit Diagnosis: Other abnormalities of gait and mobility (R26.89);Muscle weakness (generalized) (M62.81)                Time: 6967-8938 OT Time Calculation (min): 28 min Charges:  OT General Charges $OT Visit: 1  Visit OT Evaluation $OT Eval Moderate Complexity: 1 Mod OT Treatments $Self Care/Home Management : 8-22 mins  Dessie Coma, M.S. OTR/L  11/12/21, 2:21 PM  ascom [phone]

## 2021-11-12 NOTE — Progress Notes (Signed)
   11/12/21 1600  Clinical Encounter Type  Visited With Patient and family together  Visit Type Initial  Referral From Nurse  Consult/Referral To Chaplain   Chaplain responded to consult for prayer. Patient was being cared for by medical team. Chaplain services will follow up.

## 2021-11-12 NOTE — Progress Notes (Signed)
Patient presented with the following skin alterations:  Serous blister to right foot 2 x 2 x 0 cm Serous blister to right lateral leg/ankle 1 x 0.5 x 0 cm Skin Tear to Right lateral lower leg 0.5 x 0.5 x 0 cm  MASD to sacral area and area near rectum  Bruising to bilat buttocks and bilat arms  Nonblanchable redness to bilat buttocks

## 2021-11-12 NOTE — Progress Notes (Signed)
    Durable Medical Equipment  (From admission, onward)           Start     Ordered   11/12/21 1621  For home use only DME 3 n 1  Once        11/12/21 1620           Patient is not able to walk the distance required to go the bathroom, or he/she is unable to safely negotiate stairs required to access the bathroom.  A 3in1 BSC will alleviate this problem  Pricilla Riffle, [place] [phone]

## 2021-11-12 NOTE — Consult Note (Signed)
[facility] Nurse Consult Note: Reason for Consult:bilateral buttocks with stage 1 PI and moisture associated dermatitis. Right foot and ankle with blister Wound type: Pressure plus moisture, venous insufficiency Pressure Injury POA: Yes Measurement:Bedside RN to measure with next dressing change and document on Nursing Flow Sheet Wound bed:As noted above Drainage (amount, consistency, odor) None Periwound:intact Dressing procedure/placement/frequency:I have provided Nursing with guidance for turning and repositioning to minimize time in the supine position, provided a pressure redistribution chair cushion and heel boots and topical care guidance for the right foot blistered to include covering with xeroform gauze, topping with dry gauze, ABD pad and securing with Kerlix roll gauze/paper tape.  [facility] nursing team will not follow, but will remain available to this patient, the nursing and medical teams.  Please re-consult if needed.  Thank you for inviting [place] to participate in this patient's Plan of Care.  Maudie Flakes, MSN, RN, CNS, [place], Serita Grammes, [employer], [employer] phone:  [phone]

## 2021-11-12 NOTE — TOC Initial Note (Signed)
Transition of Care [facility]) - Initial/Assessment Note    Patient Details  Name: [name] MRN: [id number] Date of Birth: 1965/07/17  Transition of Care [facility]) CM/SW Contact:    Alberteen Sam, LCSW Phone Number: 11/12/2021, 4:17 PM  Clinical Narrative:                  CSW spoke with patient regarding [place] recommendations, he is agreeable to [facility] PT and OT. Referral given to [facility] with [facility] pending acceptance. Patient requested 3in1 to be delivered to his home, confirmed address in chart is correct. Referral given to [employer] to ship 3in1 to home. Patient continue to see Dr. Parks Ranger as PCP. No further dc needs identified at this time.     Barriers to Discharge: Continued Medical Work up   Patient Goals and CMS Choice Patient states their goals for this hospitalization and ongoing recovery are:: to go home CMS Medicare.gov Compare Post Acute Care list provided to:: Patient Choice offered to / list presented to : Patient  Expected Discharge Plan and Services         Living arrangements for the past 2 months: Single Family Home                 DME Arranged: 3-N-1 DME Agency: [facility] Date DME Agency Contacted: 11/12/21   Representative spoke with at DME Agency: [place]: PT, OT          Prior Living Arrangements/Services Living arrangements for the past 2 months: [place] Lives with:: Self                   Activities of Daily Living Home Assistive Devices/Equipment: None ADL Screening (condition at time of admission) Patient's cognitive ability adequate to safely complete daily activities?: Yes Is the patient deaf or have difficulty hearing?: No Does the patient have difficulty seeing, even when wearing glasses/contacts?: No Does the patient have difficulty concentrating, remembering, or making decisions?: No Patient able to express need for assistance with ADLs?: Yes Does the patient have difficulty dressing or bathing?:  No Independently performs ADLs?: Yes (appropriate for developmental age) Does the patient have difficulty walking or climbing stairs?: Yes Weakness of Legs: Both Weakness of Arms/Hands: None  Permission Sought/Granted                  Emotional Assessment       Orientation: : Oriented to Self, Oriented to Place, Oriented to  Time, Oriented to Situation Alcohol / Substance Use: Not Applicable Psych Involvement: No (comment)  Admission diagnosis:  AKI (acute kidney injury) ([place]) [N17.9] Wound check, abscess [Z51.89] Diarrhea, unspecified type [L97.6] Systolic congestive heart failure, unspecified HF chronicity ([place]) [I50.20] Patient Active Problem List   Diagnosis Date Noted   AKI (acute kidney injury) ([place]) 11/12/2021   Hypotension 11/11/2021   Hyponatremia 11/11/2021   Thrombocytopenia ([place]) 11/11/2021   Acute kidney injury superimposed on CKD ([place]) 11/10/2021   Diarrhea 11/10/2021   Volume overload 11/10/2021   Background diabetic retinopathy ([place]) 10/17/2018   Chronic pain syndrome 01/21/2017   Chronic restrictive lung disease 02/19/2016   Primary osteoarthritis involving multiple joints 01/24/2016   Asthma 09/03/2015   Elevated transaminase level 06/04/2015   Excessive daytime sleepiness 05/07/2015   GERD (gastroesophageal reflux disease) 05/07/2015   Noncompliance 04/09/2015   Allergic rhinitis 04/09/2015   Hyperlipidemia associated with type 2 diabetes mellitus ([place]) 02/15/2015   Vitamin D deficiency 01/17/2015   Elevated CO2 level 01/17/2015   Tobacco  abuse, episodic 01/17/2015   Insomnia 01/17/2015   Current tobacco use 01/11/2015   Benign hypertension with CKD (chronic kidney disease) stage IV ([place]) 07/10/2014   Proteinuria 07/10/2014   Type 2 diabetes mellitus, controlled, with renal complications ([place]) 16/94/5038   CKD (chronic kidney disease), stage IV ([place]) 07/10/2014   Secondary renal hyperparathyroidism ([place]) 07/10/2014   Cardiomyopathy, dilated,  nonischemic ([place])    Morbid obesity with BMI of 45.0-49.9, adult ([place])    Gout    Chronic systolic heart failure ([place]) 04/29/2011   PCP:  [name], DO Pharmacy:   [facility], [address]  [place] [address] Phone: [phone] Fax: [phone]     Social Determinants of Health (SDOH) Interventions    Readmission Risk Interventions     No data to display

## 2021-11-12 NOTE — Progress Notes (Signed)
Patient given pressure reduction boots per order. Patient refused, states he will "wear them sleeping"

## 2021-11-12 NOTE — ED Notes (Signed)
Patient was moved into a hospital bed at this time.

## 2021-11-13 ENCOUNTER — Inpatient Hospital Stay: Payer: PPO

## 2021-11-13 DIAGNOSIS — N189 Chronic kidney disease, unspecified: Secondary | ICD-10-CM | POA: Diagnosis not present

## 2021-11-13 DIAGNOSIS — N179 Acute kidney failure, unspecified: Secondary | ICD-10-CM | POA: Diagnosis not present

## 2021-11-13 LAB — GLUCOSE, CAPILLARY
Glucose-Capillary: 107 mg/dL — ABNORMAL HIGH (ref 70–99)
Glucose-Capillary: 118 mg/dL — ABNORMAL HIGH (ref 70–99)
Glucose-Capillary: 129 mg/dL — ABNORMAL HIGH (ref 70–99)
Glucose-Capillary: 157 mg/dL — ABNORMAL HIGH (ref 70–99)

## 2021-11-13 LAB — CBC
HCT: 37.1 % — ABNORMAL LOW (ref 39.0–52.0)
Hemoglobin: 10.9 g/dL — ABNORMAL LOW (ref 13.0–17.0)
MCH: 31.1 pg (ref 26.0–34.0)
MCHC: 29.4 g/dL — ABNORMAL LOW (ref 30.0–36.0)
MCV: 105.7 fL — ABNORMAL HIGH (ref 80.0–100.0)
Platelets: 100 10*3/uL — ABNORMAL LOW (ref 150–400)
RBC: 3.51 MIL/uL — ABNORMAL LOW (ref 4.22–5.81)
RDW: 24.8 % — ABNORMAL HIGH (ref 11.5–15.5)
WBC: 4.9 10*3/uL (ref 4.0–10.5)
nRBC: 1 % — ABNORMAL HIGH (ref 0.0–0.2)

## 2021-11-13 LAB — BASIC METABOLIC PANEL
Anion gap: 10 (ref 5–15)
BUN: 124 mg/dL — ABNORMAL HIGH (ref 6–20)
CO2: 29 mmol/L (ref 22–32)
Calcium: 8.5 mg/dL — ABNORMAL LOW (ref 8.9–10.3)
Chloride: 96 mmol/L — ABNORMAL LOW (ref 98–111)
Creatinine, Ser: 8.66 mg/dL — ABNORMAL HIGH (ref 0.61–1.24)
GFR, Estimated: 7 mL/min — ABNORMAL LOW (ref >60)
Glucose, Bld: 112 mg/dL — ABNORMAL HIGH (ref 70–99)
Potassium: 3.9 mmol/L (ref 3.5–5.1)
Sodium: 135 mmol/L (ref 135–145)

## 2021-11-13 LAB — HEPATITIS C ANTIBODY: HCV Ab: NONREACTIVE — AB

## 2021-11-13 MED ORDER — FUROSEMIDE 10 MG/ML IJ SOLN
3.0000 mg/h | INTRAVENOUS | Status: DC
  Administered 2021-11-13 (×2): 3 mg/h via INTRAVENOUS
  Filled 2021-11-13 (×2): qty 20

## 2021-11-13 NOTE — TOC Progression Note (Signed)
Transition of Care [facility]) - Progression Note    Patient Details  Name: [name] MRN: [id number] Date of Birth: 1965-02-07  Transition of Care [facility]) CM/SW [place], [place] Phone Number: 11/13/2021, 5:08 PM  Clinical Narrative:     SW spoke to the [place] [facility] and they do not have many wound care nurses. SW asked MD if the patient will need a wound care nurse. If patient has wound care needs he will need another home health agency or will need to attend a wound care clinic.    Barriers to Discharge: Continued Medical Work up  Expected Discharge Plan and Services         Living arrangements for the past 2 months: [place]                 DME Arranged: 3-N-1 DME Agency: [employer] Date DME Agency Contacted: 11/12/21   Representative spoke with at DME Agency: Suanne Marker HH Arranged: PT, OT           Social Determinants of Health ([place]) Interventions    Readmission Risk Interventions     No data to display

## 2021-11-13 NOTE — Plan of Care (Signed)
Pt exhibits anxiousness over HD plan. Pt taken to HD unit to see unit and speak to HD nurses. Pt has signed consent and is currently agreeable to have HD as planned.

## 2021-11-13 NOTE — Progress Notes (Signed)
Spoke with primary RN, Velna Hatchet regarding PIV consult. Informed RN that IVT will not be on campus until 1900 on 10/19. Recommended RN to reach out to fellow colleagues for any current access needs.

## 2021-11-13 NOTE — Progress Notes (Signed)
Mobility Specialist - Progress Note   11/13/21 1457  Mobility  Activity Dangled on edge of bed;Turned to right side  Level of Assistance Minimal assist, patient does 75% or more  Assistive Device None  Distance Ambulated (ft) 0 ft  Activity Response Tolerated well  $Mobility charge 1 Mobility   Pt supine in bed on RA upon arrival. Pt very agitated about bed alarm and wanting to go to restroom. Pt completes bed mobility MinA. Pt able to maintain sitting balance for 3~5 minutes. Pt's food tray arrives during session and pt rudely defers further mobility. Pt left EOB with needs in reach and bed alarm on.    Gretchen Short  Mobility Specialist  11/13/21 3:01 PM

## 2021-11-13 NOTE — Progress Notes (Signed)
HD consent signed and physical copy placed on chart

## 2021-11-13 NOTE — Progress Notes (Signed)
PROGRESS NOTE    [name]  ZOX:[id number] DOB: December 03, 1965 DOA: 11/10/2021 PCP: [name], DO    Brief Narrative:  56 year old male morbid obesity, hyperlipidemia, hypertension, insulin-dependent diabetes mellitus, GERD, who presents emergency department for chief concerns of abnormal labs.  Patient has been on gentle intravenous fluids given volume overloaded state.  This does not result in an improvement in serum creatinine.  Discussed with nephrology on 10/18.  Patient is adamantly refusing renal replacement therapy.  Requesting his diuretics.  We will attempt to treat as cardiorenal syndrome and restart home diuretics.  Monitor volume status carefully.  Assessment & Plan:   Principal Problem:   Acute kidney injury superimposed on CKD [facility]) Active Problems:   Morbid obesity with BMI of 45.0-49.9, adult (HCC)   Benign hypertension with CKD (chronic kidney disease) stage IV (HCC)   Proteinuria   Type 2 diabetes mellitus, controlled, with renal complications (HCC)   Secondary renal hyperparathyroidism (HCC)   Tobacco abuse, episodic   Hyperlipidemia associated with type 2 diabetes mellitus ([place])   Current tobacco use   GERD (gastroesophageal reflux disease)   Chronic pain syndrome   Diarrhea   Volume overload   Hypotension   Hyponatremia   Thrombocytopenia (HCC)   AKI (acute kidney injury) ([place])   Pressure injury of skin  * Acute kidney injury superimposed on CKD (HCC) Fluid overload Creatinine continues to deteriorate.  Patient remains volume overloaded.  Starting to exhibit signs of uremic encephalopathy.  Needs renal replacement therapy but patient is adamantly refusing at this time. Plan: Lasix GTT Daily renal function Nephrology follow-up Palliative care consultation   Thrombocytopenia ([place]) Continue to monitor.  Secondary to ESRD   Hyponatremia Secondary to dehydration and poor p.o. intake.  Now also volume overload is contributing    Hypotension Hold antihypertensive medications currently   Diarrhea Stool studies negative.  Diet fluid restriction however patient has been nonadherent   Chronic pain syndrome Tramadol is not a good medication with acute kidney injury.  Careful with this medication.  Monitor mental status carefully before administration of any sedative or narcotic medication   GERD (gastroesophageal reflux disease) - PPI   Current tobacco use - PRN nicotine patch   Hyperlipidemia associated with type 2 diabetes mellitus ([place]) Continue atorvastatin   Type 2 diabetes mellitus, controlled, with renal complications (HCC) Holding long-acting insulin and using sliding scale only.   Benign hypertension with CKD (chronic kidney disease) stage IV (HCC) Holding Coreg   Morbid obesity with BMI of 45.0-49.9, adult ([place]) - Patient has BMI of 47.29 with current height and weight in computer.  This complicates overall care and prognosis.  DVT prophylaxis: SQ heparin Code Status: Full Family Communication: Mother at bedside 10/19 Disposition Plan: Status is: Inpatient Remains inpatient appropriate because: Severe AKI on CKD   Level of care: Med-Surg  Consultants:  Nephrology Palliative care  Procedures:  None  Antimicrobials: None   Subjective: And examined.  More lethargic this morning.  Awakens to refuse dialysis.  At this point still has decision-making capability.  Objective: Vitals:   11/13/21 0032 11/13/21 0411 11/13/21 0841 11/13/21 1235  BP: 134/86 110/85 127/83 126/78  Pulse: 81 83 83 83  Resp: 18 16 20 18   Temp: (!) 97.4 F (36.3 C) 97.9 F (36.6 C) 98.1 F (36.7 C) 98 F (36.7 C)  TempSrc:      SpO2: 96% 100% 93% 97%  Weight:      Height:  Intake/Output Summary (Last 24 hours) at 11/13/2021 1244 Last data filed at 11/13/2021 0900 Gross per 24 hour  Intake 240 ml  Output --  Net 240 ml   Filed Weights   11/11/21 1119  Weight: (!) 153.8 kg     Examination:  General exam: Lethargic Respiratory system: Scattered crackles.  Scattered wheeze.  Normal work of breathing.  2 L Cardiovascular system: S1-S2, RRR, no murmurs, 3+ pitting edema BLE Gastrointestinal system: Obese, NT/ND, normal bowel sounds Central nervous system: Alert and oriented. No focal neurological deficits. Extremities: Symmetric 5 x 5 power. Skin: No rashes, lesions or ulcers Psychiatry: Judgement and insight appear normal. Mood & affect appropriate.     Data Reviewed: I have personally reviewed following labs and imaging studies  CBC: Recent Labs  Lab 11/10/21 1404 11/10/21 2303 11/11/21 0859 11/12/21 0441 11/13/21 0829  WBC 4.4 4.4 4.5 4.8 4.9  HGB 11.1* 10.9* 10.7* 10.7* 10.9*  HCT 36.1* 36.8* 36.2* 35.2* 37.1*  MCV 99.7 101.7* 102.0* 103.5* 105.7*  PLT 90* 90* 96* 102* 379*   Basic Metabolic Panel: Recent Labs  Lab 11/10/21 1404 11/10/21 2303 11/11/21 0859 11/12/21 0441 11/13/21 0829  NA 123* 125* 127* 131* 135  K 3.7 3.7 3.7 3.9 3.9  CL 83* 83* 87* 92* 96*  CO2 29 30 29 30 29   GLUCOSE 77 102* 146* 102* 112*  BUN 104* 101* 112* 115* 124*  CREATININE 8.08* 8.22* 8.15* 8.26* 8.66*  CALCIUM 7.9* 8.0* 8.1* 8.1* 8.5*   GFR: Estimated Creatinine Clearance: 14.7 mL/min (A) (by C-G formula based on SCr of 8.66 mg/dL (H)). Liver Function Tests: Recent Labs  Lab 11/10/21 1404  AST 61*  ALT 55*  ALKPHOS 468*  BILITOT 3.4*  PROT 5.8*  ALBUMIN 2.1*   Recent Labs  Lab 11/10/21 1404  LIPASE 97*   Recent Labs  Lab 11/10/21 2303  AMMONIA 52*   Coagulation Profile: No results for input(s): "INR", "PROTIME" in the last 168 hours. Cardiac Enzymes: Recent Labs  Lab 11/10/21 1404  CKTOTAL 369   BNP (last 3 results) No results for input(s): "PROBNP" in the last 8760 hours. HbA1C: No results for input(s): "HGBA1C" in the last 72 hours. CBG: Recent Labs  Lab 11/12/21 1140 11/12/21 1628 11/12/21 2126 11/13/21 0838  11/13/21 1232  GLUCAP 127* 90 112* 107* 118*   Lipid Profile: No results for input(s): "CHOL", "HDL", "LDLCALC", "TRIG", "CHOLHDL", "LDLDIRECT" in the last 72 hours. Thyroid Function Tests: No results for input(s): "TSH", "T4TOTAL", "FREET4", "T3FREE", "THYROIDAB" in the last 72 hours. Anemia Panel: No results for input(s): "VITAMINB12", "FOLATE", "FERRITIN", "TIBC", "IRON", "RETICCTPCT" in the last 72 hours. Sepsis Labs: No results for input(s): "PROCALCITON", "LATICACIDVEN" in the last 168 hours.  Recent Results (from the past 240 hour(s))  Gastrointestinal Panel by PCR , Stool     Status: None   Collection Time: 11/10/21 11:03 PM   Specimen: Stool  Result Value Ref Range Status   Campylobacter species NOT DETECTED NOT DETECTED Final   Plesimonas shigelloides NOT DETECTED NOT DETECTED Final   Salmonella species NOT DETECTED NOT DETECTED Final   Yersinia enterocolitica NOT DETECTED NOT DETECTED Final   Vibrio species NOT DETECTED NOT DETECTED Final   Vibrio cholerae NOT DETECTED NOT DETECTED Final   Enteroaggregative E coli (EAEC) NOT DETECTED NOT DETECTED Final   Enteropathogenic E coli (EPEC) NOT DETECTED NOT DETECTED Final   Enterotoxigenic E coli (ETEC) NOT DETECTED NOT DETECTED Final   Shiga like toxin producing E coli (STEC)  NOT DETECTED NOT DETECTED Final   Shigella/Enteroinvasive E coli (EIEC) NOT DETECTED NOT DETECTED Final   Cryptosporidium NOT DETECTED NOT DETECTED Final   Cyclospora cayetanensis NOT DETECTED NOT DETECTED Final   Entamoeba histolytica NOT DETECTED NOT DETECTED Final   Giardia lamblia NOT DETECTED NOT DETECTED Final   Adenovirus F40/41 NOT DETECTED NOT DETECTED Final   Astrovirus NOT DETECTED NOT DETECTED Final   Norovirus GI/GII NOT DETECTED NOT DETECTED Final   Rotavirus A NOT DETECTED NOT DETECTED Final   Sapovirus (I, II, IV, and V) NOT DETECTED NOT DETECTED Final    Comment: Performed at [facility], [address], [place],  [place] [address]  C Difficile Quick Screen w PCR reflex     Status: None   Collection Time: 11/10/21 11:03 PM   Specimen: Stool  Result Value Ref Range Status   C Diff antigen NEGATIVE NEGATIVE Final   C Diff toxin NEGATIVE NEGATIVE Final   C Diff interpretation No C. difficile detected.  Final    Comment: Performed at [facility], [address] [place], [place] [address]  SARS Coronavirus 2 by RT PCR (hospital order, performed in [facility] hospital lab) *cepheid single result test* Anterior Nasal Swab     Status: None   Collection Time: 11/10/21 11:03 PM   Specimen: Anterior Nasal Swab  Result Value Ref Range Status   SARS Coronavirus 2 by RT PCR NEGATIVE NEGATIVE Final    Comment: (NOTE) SARS-CoV-2 target nucleic acids are NOT DETECTED.  The SARS-CoV-2 RNA is generally detectable in upper and lower respiratory specimens during the acute phase of infection. The lowest concentration of SARS-CoV-2 viral copies this assay can detect is 250 copies / mL. A negative result does not preclude SARS-CoV-2 infection and should not be used as the sole basis for treatment or other patient management decisions.  A negative result may occur with improper specimen collection / handling, submission of specimen other than nasopharyngeal swab, presence of viral mutation(s) within the areas targeted by this assay, and inadequate number of viral copies (<250 copies / mL). A negative result must be combined with clinical observations, patient history, and epidemiological information.  Fact Sheet for Patients:   https://www.patel.info/  Fact Sheet for Healthcare Providers: https://hall.com/  This test is not yet approved or  cleared by the [place] FDA and has been authorized for detection and/or diagnosis of SARS-CoV-2 by FDA under an Emergency Use Authorization (EUA).  This EUA will remain in effect (meaning this test can be used) for the  duration of the COVID-19 declaration under Section 564(b)(1) of the Act, 21 U.S.C. section 360bbb-3(b)(1), unless the authorization is terminated or revoked sooner.  Performed at [facility], [address] [place], [place] [address]          Radiology Studies: [facility] Chest [place] 1 View  Result Date: 11/13/2021 CLINICAL DATA:  ATN.  History of asthma. EXAM: PORTABLE CHEST 1 VIEW COMPARISON:  Chest radiograph 11/10/2021 FINDINGS: No pleural effusion. Pneumothorax. Redemonstrated cardiomegaly with associated mild pulmonary edema which has slightly increased from prior exam. No displaced rib fractures. No focal airspace opacity. Visualized upper abdomen is unremarkable. IMPRESSION: Cardiomegaly with associated mild pulmonary edema, slightly increased from prior exam. No pleural effusion. Electronically Signed   By: Marin Roberts M.D.   On: 11/13/2021 11:27   ECHOCARDIOGRAM COMPLETE  Result Date: 11/11/2021    ECHOCARDIOGRAM REPORT   Patient Name:   [name] Date of Exam: 11/11/2021 Medical Rec #:  [id number]        Height:       71.0 in Accession #:    [id number]       Weight:       339.1 lb Date of Birth:  [birth year]       BSA:          2.639 m Patient Age:    71 years         BP:           111/73 mmHg Patient Gender: M                HR:           76 bpm. Exam Location:  [facility] Procedure: 2D Echo, Limited Color Doppler and Cardiac Doppler Indications:     R06.00 Dyspnea  History:         Patient has prior history of Echocardiogram examinations, most                  recent 04/30/2018. Risk Factors:Hypertension.  Sonographer:     Charmayne Sheer Referring Phys:  [id number] AMY N COX Diagnosing Phys: Serafina Royals MD  Sonographer Comments: No apical window. Image acquisition challenging due to patient body habitus and Image acquisition challenging due to uncooperative patient. IMPRESSIONS  1. Left ventricular ejection fraction, by estimation, is 60 to 65%. The left ventricle has normal  function. The left ventricle has no regional wall motion abnormalities. Left ventricular diastolic parameters were normal.  2. Right ventricular systolic function is normal. The right ventricular size is normal.  3. The mitral valve is normal in structure. Trivial mitral valve regurgitation.  4. The aortic valve is normal in structure. Aortic valve regurgitation is trivial. FINDINGS  Left Ventricle: Left ventricular ejection fraction, by estimation, is 60 to 65%. The left ventricle has normal function. The left ventricle has no regional wall motion abnormalities. The left ventricular internal cavity size was normal in size. There is  no left ventricular hypertrophy. Left ventricular diastolic parameters were normal. Right Ventricle: The right ventricular size is normal. No increase in right ventricular wall thickness. Right ventricular systolic function is normal. Left Atrium: Left atrial size was normal in size. Right Atrium: Right atrial size was normal in size. Pericardium: There is no evidence of pericardial effusion. Mitral Valve: The mitral valve is normal in structure. Trivial mitral valve regurgitation. Tricuspid Valve: The tricuspid valve is normal in structure. Tricuspid valve regurgitation is trivial. Aortic Valve: The aortic valve is normal in structure. Aortic valve regurgitation is trivial. Pulmonic Valve: The pulmonic valve was normal in structure. Pulmonic valve regurgitation is trivial. Aorta: The aortic root and ascending aorta are structurally normal, with no evidence of dilitation. IAS/Shunts: No atrial level shunt detected by color flow Doppler.  LEFT VENTRICLE PLAX 2D LVIDd:         5.30 cm LVIDs:         4.50 cm LV PW:         1.80 cm LV IVS:        1.30 cm LVOT diam:     2.80 cm LVOT Area:     6.16 cm                        PULMONIC VALVE AORTA                 PV Vmax:          1.12 m/s Ao Root diam:  4.20 cm PV Vmean:         73.000 cm/s                       PV VTI:           0.182 m                        PV Peak grad:     5.0 mmHg                       PV Mean grad:     3.0 mmHg                       PR End Diast Vel: 6.15 msec  MITRAL VALVE MV Area (PHT): 4.80 cm    SHUNTS MV Decel Time: 158 msec    Systemic Diam: 2.80 cm MV E velocity: 66.00 cm/s MV A velocity: 70.70 cm/s MV E/A ratio:  0.93 Serafina Royals MD Electronically signed by Serafina Royals MD Signature Date/Time: 11/11/2021/5:05:52 PM    Final         Scheduled Meds:  aspirin  81 mg Oral Daily   atorvastatin  20 mg Oral Daily   heparin  5,000 Units Subcutaneous Q8H   insulin aspart  0-5 Units Subcutaneous QHS   insulin aspart  0-9 Units Subcutaneous TID WC   lidocaine  1 patch Transdermal Q24H   liver oil-zinc oxide   Topical TID   methocarbamol  500 mg Oral TID   nystatin   Topical TID   pantoprazole  80 mg Oral BID   Continuous Infusions:  furosemide (LASIX) 200 mg in dextrose 5 % 100 mL (2 mg/mL) infusion       LOS: 3 days     Sidney Ace, MD [facility]   If 7PM-7AM, please contact night-coverage  11/13/2021, 12:44 PM

## 2021-11-13 NOTE — Progress Notes (Signed)
[name]  MRN: [id number]  DOB/AGE: December 27, 1965 56 y.o.  Primary Care Physician:[name], [name], DO  Admit date: 11/10/2021  Chief Complaint:  Chief Complaint  Patient presents with   Diarrhea   Wound Check    S-Pt presented on  11/10/2021 with  Chief Complaint  Patient presents with   Diarrhea   Wound Check  . Patient is a 56 year old African-American male with a past medical history of morbid obesity, hyperlipidemia, hypertension, diabetes mellitus type 2, GERD who was asked to come to the ER with chief complaint of diarrhea.  History of present illness dates back to 2 weeks ago when patient started having diarrhea.  Patient also gives a history of nausea and vomiting. Patient also gives a history of fall. Patient went to see his primary care yesterday and was requested to come to the ER. Upon evaluation in the ER patient was found to have serum sodium 123, BUN of 104 and serum creatinine of 8.  Patient was admitted for further care.   Nephrology was consulted for acute kidney injury.Patient was seen today in the ER Patient is well-known to our practice as patient does see Dr. Candiss Norse now, in the past patient has been followed by Dr. Juleen China.   Patient was seen in the ER. Patient offers no new specific physical complaints. I then again discussed with the patient about his kidney related issues. -I discussed the patient about his low GFR and possible need for renal placement therapy. Patient informed me in  Again clear terms that he does not wish for dialysis at any cost.   Patient then informed me that in the past he has had multiple episodes where his kidney function has gone down and usually recovered.  Patient was not willing to go for renal placement therapy .   Medications   aspirin  81 mg Oral Daily   atorvastatin  20 mg Oral Daily   bumetanide  4 mg Oral BID   heparin  5,000 Units Subcutaneous Q8H   insulin aspart  0-5 Units Subcutaneous QHS    insulin aspart  0-9 Units Subcutaneous TID WC   lidocaine  1 patch Transdermal Q24H   liver oil-zinc oxide   Topical TID   methocarbamol  500 mg Oral TID   nystatin   Topical TID   pantoprazole  80 mg Oral BID         UGQ:BVQXI from the symptoms mentioned above,there are no other symptoms referable to all systems reviewed.  Physical Exam: Vital signs in last 24 hours: Temp:  [97.4 F (36.3 C)-98.2 F (36.8 C)] 97.9 F (36.6 C) (10/19 0411) Pulse Rate:  [74-83] 83 (10/19 0411) Resp:  [16-20] 16 (10/19 0411) BP: (110-135)/(64-86) 110/85 (10/19 0411) SpO2:  [84 %-100 %] 100 % (10/19 0411) Weight change:  Last BM Date : 11/12/21  Intake/Output from previous day: No intake/output data recorded. No intake/output data recorded.   Physical Exam:  General- pt is awake,alert, oriented to time place and person  HEENT-head is atraumatic normocephalic, sclera is muddy  Resp- No acute REsp distress, Decreased breath sounds at bases  CVS- S1S2 regular in rate and rhythm  GIT- BS+, soft, Non tender , Non distended, Morbidly obese  EXT- Chronic venous stasis changes,  No Cyanosis          2 + edema   Lab Results:  CBC  Recent Labs    11/11/21 0859 11/12/21 0441  WBC 4.5 4.8  HGB 10.7* 10.7*  HCT  36.2* 35.2*  PLT 96* 102*    BMET  Recent Labs    11/11/21 0859 11/12/21 0441  NA 127* 131*  K 3.7 3.9  CL 87* 92*  CO2 29 30  GLUCOSE 146* 102*  BUN 112* 115*  CREATININE 8.15* 8.26*  CALCIUM 8.1* 8.1*      Most recent Creatinine trend  Lab Results  Component Value Date   CREATININE 8.26 (H) 11/12/2021   CREATININE 8.15 (H) 11/11/2021   CREATININE 8.22 (H) 11/10/2021      MICRO   Recent Results (from the past 240 hour(s))  Gastrointestinal Panel by PCR , Stool     Status: None   Collection Time: 11/10/21 11:03 PM   Specimen: Stool  Result Value Ref Range Status   Campylobacter species NOT DETECTED NOT DETECTED Final   Plesimonas shigelloides  NOT DETECTED NOT DETECTED Final   Salmonella species NOT DETECTED NOT DETECTED Final   Yersinia enterocolitica NOT DETECTED NOT DETECTED Final   Vibrio species NOT DETECTED NOT DETECTED Final   Vibrio cholerae NOT DETECTED NOT DETECTED Final   Enteroaggregative E coli (EAEC) NOT DETECTED NOT DETECTED Final   Enteropathogenic E coli (EPEC) NOT DETECTED NOT DETECTED Final   Enterotoxigenic E coli (ETEC) NOT DETECTED NOT DETECTED Final   Shiga like toxin producing E coli (STEC) NOT DETECTED NOT DETECTED Final   Shigella/Enteroinvasive E coli (EIEC) NOT DETECTED NOT DETECTED Final   Cryptosporidium NOT DETECTED NOT DETECTED Final   Cyclospora cayetanensis NOT DETECTED NOT DETECTED Final   Entamoeba histolytica NOT DETECTED NOT DETECTED Final   Giardia lamblia NOT DETECTED NOT DETECTED Final   Adenovirus F40/41 NOT DETECTED NOT DETECTED Final   Astrovirus NOT DETECTED NOT DETECTED Final   Norovirus GI/GII NOT DETECTED NOT DETECTED Final   Rotavirus A NOT DETECTED NOT DETECTED Final   Sapovirus (I, II, IV, and V) NOT DETECTED NOT DETECTED Final    Comment: Performed at [facility], [address] [place], [place] [address]  C Difficile Quick Screen w PCR reflex     Status: None   Collection Time: 11/10/21 11:03 PM   Specimen: Stool  Result Value Ref Range Status   C Diff antigen NEGATIVE NEGATIVE Final   C Diff toxin NEGATIVE NEGATIVE Final   C Diff interpretation No C. difficile detected.  Final    Comment: Performed at [facility], [address] [place], [place] [address]  SARS Coronavirus 2 by RT PCR (hospital order, performed in [facility] hospital lab) *cepheid single result test* Anterior Nasal Swab     Status: None   Collection Time: 11/10/21 11:03 PM   Specimen: Anterior Nasal Swab  Result Value Ref Range Status   SARS Coronavirus 2 by RT PCR NEGATIVE NEGATIVE Final    Comment: (NOTE) SARS-CoV-2 target nucleic acids are NOT DETECTED.  The SARS-CoV-2 RNA  is generally detectable in upper and lower respiratory specimens during the acute phase of infection. The lowest concentration of SARS-CoV-2 viral copies this assay can detect is 250 copies / mL. A negative result does not preclude SARS-CoV-2 infection and should not be used as the sole basis for treatment or other patient management decisions.  A negative result may occur with improper specimen collection / handling, submission of specimen other than nasopharyngeal swab, presence of viral mutation(s) within the areas targeted by this assay, and inadequate number of viral copies (<250 copies / mL). A negative result must be combined with clinical observations, patient history, and epidemiological  information.  Fact Sheet for Patients:   https://www.patel.info/  Fact Sheet for Healthcare Providers: https://hall.com/  This test is not yet approved or  cleared by the [place] FDA and has been authorized for detection and/or diagnosis of SARS-CoV-2 by FDA under an Emergency Use Authorization (EUA).  This EUA will remain in effect (meaning this test can be used) for the duration of the COVID-19 declaration under Section 564(b)(1) of the Act, 21 U.S.C. section 360bbb-3(b)(1), unless the authorization is terminated or revoked sooner.  Performed at [facility], [address], [place], [place] [address]     Renal ultrasound done on November 11, 2021 was reviewed  Right Kidney:   Renal measurements: 10.9 x 6.0 x 5.2 cm = volume: 177 mL. Echogenicity within normal limits. No mass or hydronephrosis visualized.   Left Kidney:   Renal measurements: 10.4 x 7.0 x 6.1 cm = volume: 233 mL. Echogenicity within normal limits. No mass or hydronephrosis visualized.   Bladder:   Appears normal for degree of bladder distention.   Other:   Incidental note of sludge and stones within the gallbladder.   IMPRESSION: Unremarkable  ultrasound of the kidneys.   Cholelithiasis.  Chest x-ray done on November 10, 2021 was reviewed FINDINGS: Cardiomegaly. Pulmonary vascular congestion. Diffuse bilateral interstitial prominence. No large pleural fluid collection. No pneumothorax.   IMPRESSION: Cardiomegaly with pulmonary vascular congestion and interstitial edema.   Impression:   1)Renal    Patient has acute kidney injury Patient has AKI secondary to ATN AKI with some component of prerenal issues as patient was having decreased oral intake Patient renal ultrasound does not show any  obstruction Patient has AKI on CKD Patient has baseline CKD stage IV Patient has CKD stage IV secondary diabetes mellitus Patient CKD has been marked with multiple episodes of AKI    Creatinine trend 2023 2.1--2.3 and now at 8.2 2022 3.0--5.0-AKI 2021 2.5--4.2-AKI 2020 2019 2.0 2018 2.7--3.7 2017 1.6--2.1 2016 1.3--1.6 3.0  2)HTN    Blood pressure is stable    3)Anemia of chronic disease     Latest Ref Rng & Units 11/12/2021    4:41 AM 11/11/2021    8:59 AM 11/10/2021   11:03 PM  CBC  WBC 4.0 - 10.5 K/uL 4.8  4.5  4.4   Hemoglobin 13.0 - 17.0 g/dL 10.7  10.7  10.9   Hematocrit 39.0 - 52.0 % 35.2  36.2  36.8   Platelets 150 - 400 K/uL 102  96  90        HGb at goal (9--11)   4) Secondary hyperparathyroidism -CKD Mineral-Bone Disorder    Lab Results  Component Value Date   CALCIUM 8.1 (L) 11/12/2021    Secondary Hyperparathyroidism  present . Patient has history of secondary hyperparathyroidism Patient intact PTH has been elevated going back to 2020  Phosphorus at goal.   5)Diabetes mellitus type 2 Being followed by the primary team    6) Electrolytes      Latest Ref Rng & Units 11/12/2021    4:41 AM 11/11/2021    8:59 AM 11/10/2021   11:03 PM  BMP  Glucose 70 - 99 mg/dL 102  146  102   BUN 6 - 20 mg/dL 115  112  101   Creatinine 0.61 - 1.24 mg/dL 8.26  8.15  8.22   Sodium 135  - 145 mmol/L 131  127  125   Potassium 3.5 - 5.1 mmol/L 3.9  3.7  3.7   Chloride  98 - 111 mmol/L 92  87  83   CO2 22 - 32 mmol/L 30  29  30    Calcium 8.9 - 10.3 mg/dL 8.1  8.1  8.0      Sodium Hyponatremia Patient sodium is improving .   Potassium Normokalemic    7)Acid base    Co2 at goal  8) Right-sided heart failure/Fluid overload  Patient 2D echo done and 2020 had shown  Right Ventricle: The right ventricle has mildly reduced systolic function.  The cavity in severely enlarged in size. There is no increase in right  ventricular wall thickness. Right ventricular systolic pressure could not  be assessed.   On examination patient has 2+ edema. During the part of day with parent back-and-forth regarding IV Lasix IV drip Lasix and hemodialysis.  Patient was very reluctant to change any of his medications. After extensive discussion patient was willing to undergo renal placement therapy  Plan:  Patient is in fluid overload With patient's weight, chronic venous stasis changes and a history of right-sided heart failure it is difficult to assess patient's volume. We will start patient on diuretics  Patient is not agreeable for renal replacement therapy at this time  Addendum As patient was not agreeable for dialysis I requested dialysis educator, my nurse practitioner to please help with educating the patient. Patient was seen again in the later part of the day.  Patient has now changed his mind and was willing to undergo dialysis. Patient informed me that he will try it. I then contacted vascular team about need for access. Patient is noted to have dialysis access placed tomorrow and patient will undergo his first treatment tomorrow      Jasey Cortez s Theador Hawthorne 11/13/2021, 8:03 AM

## 2021-11-13 NOTE — Significant Event (Signed)
Patient was upset and wished to leave AMA.  I went to speak with patient at bedside.  Lengthy discussion regarding severe renal dysfunction, substantial risks of leaving including death.  He is aware that his UOP has been inadequate despite aggressive diuresis.   At the end of our discussion patient has agreed to temporary HD.    Plan: Vascular consulted Plan for temp HD catheter placement tomorrow 10/20 Nephrology and RN made aware of plan Anticipate HD tomorrow after catheter is placed  Ralene Muskrat MD

## 2021-11-14 ENCOUNTER — Encounter
Admission: EM | Discharge status: Against Medical Advice | Payer: Self-pay | Source: Ambulatory Visit | Attending: Internal Medicine

## 2021-11-14 DIAGNOSIS — N189 Chronic kidney disease, unspecified: Secondary | ICD-10-CM | POA: Diagnosis not present

## 2021-11-14 DIAGNOSIS — N179 Acute kidney failure, unspecified: Secondary | ICD-10-CM | POA: Diagnosis not present

## 2021-11-14 HISTORY — PX: TEMPORARY DIALYSIS CATHETER: CATH118312

## 2021-11-14 LAB — BASIC METABOLIC PANEL
Anion gap: 10 (ref 5–15)
BUN: 128 mg/dL — ABNORMAL HIGH (ref 6–20)
CO2: 29 mmol/L (ref 22–32)
Calcium: 8.2 mg/dL — ABNORMAL LOW (ref 8.9–10.3)
Chloride: 96 mmol/L — ABNORMAL LOW (ref 98–111)
Creatinine, Ser: 8.37 mg/dL — ABNORMAL HIGH (ref 0.61–1.24)
GFR, Estimated: 7 mL/min — ABNORMAL LOW (ref >60)
Glucose, Bld: 118 mg/dL — ABNORMAL HIGH (ref 70–99)
Potassium: 3.7 mmol/L (ref 3.5–5.1)
Sodium: 135 mmol/L (ref 135–145)

## 2021-11-14 LAB — CBC
HCT: 33.7 % — ABNORMAL LOW (ref 39.0–52.0)
Hemoglobin: 9.9 g/dL — ABNORMAL LOW (ref 13.0–17.0)
MCH: 30.9 pg (ref 26.0–34.0)
MCHC: 29.4 g/dL — ABNORMAL LOW (ref 30.0–36.0)
MCV: 105.3 fL — ABNORMAL HIGH (ref 80.0–100.0)
Platelets: 103 10*3/uL — ABNORMAL LOW (ref 150–400)
RBC: 3.2 MIL/uL — ABNORMAL LOW (ref 4.22–5.81)
RDW: 24.3 % — ABNORMAL HIGH (ref 11.5–15.5)
WBC: 4.7 10*3/uL (ref 4.0–10.5)
nRBC: 1.1 % — ABNORMAL HIGH (ref 0.0–0.2)

## 2021-11-14 LAB — GLUCOSE, CAPILLARY
Glucose-Capillary: 102 mg/dL — ABNORMAL HIGH (ref 70–99)
Glucose-Capillary: 163 mg/dL — ABNORMAL HIGH (ref 70–99)
Glucose-Capillary: 141 mg/dL — ABNORMAL HIGH (ref 70–99)

## 2021-11-14 LAB — MAGNESIUM: Magnesium: 1.9 mg/dL (ref 1.7–2.4)

## 2021-11-14 SURGERY — TEMPORARY DIALYSIS CATHETER
Anesthesia: Moderate Sedation | Laterality: Right

## 2021-11-14 MED ORDER — HEPARIN SODIUM (PORCINE) 1000 UNIT/ML IJ SOLN
INTRAMUSCULAR | Status: AC
  Filled 2021-11-14: qty 10

## 2021-11-14 MED ORDER — HEPARIN SODIUM (PORCINE) 1000 UNIT/ML DIALYSIS: 1000.0000 [IU] | INTRAMUSCULAR | Status: DC | PRN

## 2021-11-14 MED ORDER — ANTICOAGULANT SODIUM CITRATE 4% (200MG/5ML) IV SOLN
5.0000 mL | Status: DC | PRN
  Filled 2021-11-14: qty 5

## 2021-11-14 MED ORDER — LIDOCAINE-PRILOCAINE 2.5-2.5 % EX CREA
1.0000 | TOPICAL_CREAM | CUTANEOUS | Status: DC | PRN
  Filled 2021-11-14: qty 5

## 2021-11-14 MED ORDER — LIDOCAINE HCL (PF) 1 % IJ SOLN
5.0000 mL | INTRAMUSCULAR | Status: DC | PRN
  Filled 2021-11-14: qty 5

## 2021-11-14 MED ORDER — CHLORHEXIDINE GLUCONATE CLOTH 2 % EX PADS: 6.0000 | MEDICATED_PAD | Freq: Every day | CUTANEOUS | Status: DC

## 2021-11-14 MED ORDER — PENTAFLUOROPROP-TETRAFLUOROETH EX AERO: 1.0000 | INHALATION_SPRAY | CUTANEOUS | Status: DC | PRN

## 2021-11-14 MED ORDER — ALTEPLASE 2 MG IJ SOLR: 2.0000 mg | Freq: Once | INTRAMUSCULAR | Status: DC | PRN

## 2021-11-14 MED ORDER — LIDOCAINE HCL (PF) 1 % IJ SOLN
INTRAMUSCULAR | Status: DC | PRN
  Administered 2021-11-14: 5 mL via INTRADERMAL

## 2021-11-14 SURGICAL SUPPLY — 4 items
COVER PROBE ULTRASOUND 5X96 (MISCELLANEOUS) IMPLANT
KIT DIALYSIS CATH TRI 30X13 (CATHETERS) IMPLANT
KIT MICROPUNCTURE NIT STIFF (SHEATH) IMPLANT
PACK ANGIOGRAPHY (CUSTOM PROCEDURE TRAY) ×1 IMPLANT

## 2021-11-14 NOTE — Progress Notes (Signed)
       CROSS COVER NOTE  NAME: [name] MRN: [id number] DOB : 02-23-1965    Date of Service   11/14/2021  HPI/Events of Note   Notified by nurse patient increased his own lasix drip to 4.5 mg/h from  mg/h. Per nurse last checked an hour before the change  Assessment and  Interventions   Assessment: VSS Plan: Lock IV pump or get sitter Check renal function/lytes - BMP and mag ordered        Kathlene Cote NP [facility]

## 2021-11-14 NOTE — Op Note (Signed)
  OPERATIVE NOTE   PROCEDURE: Ultrasound guidance for vascular access right femoral vein Placement of a 30 cm triple lumen dialysis catheter right femoral vein  PRE-OPERATIVE DIAGNOSIS: 1. Renal failure  POST-OPERATIVE DIAGNOSIS: Same  SURGEON: Leotis Pain, MD  ASSISTANT(S): None  ANESTHESIA: local  ESTIMATED BLOOD LOSS: Minimal   FINDING(S): 1.  None  SPECIMEN(S):  None  INDICATIONS:    Patient is a 56 y.o.male who presents with renal failure.  Risks and benefits were discussed, and informed consent was obtained..  DESCRIPTION: After obtaining full informed written consent, the patient was laid flat in the bed.  The right groin was sterilely prepped and draped in a sterile surgical field was created. The right femoral vein was visualized with ultrasound and found to be widely patent. It was then accessed under direct guidance without difficulty with a Seldinger needle and a permanent image was recorded. A J-wire was then placed. After skin nick and dilatation, a 30 cm triple lumen dialysis catheter was placed over the wire and the wire was removed. The lumens withdrew dark red nonpulsatile blood and flushed easily with sterile saline. The catheter was secured to the skin with 3 nylon sutures. Sterile dressing was placed.  COMPLICATIONS: None  CONDITION: Stable  Leotis Pain 11/14/2021 2:16 PM  This note was created with Dragon Medical transcription system. Any errors in dictation are purely unintentional.

## 2021-11-14 NOTE — Progress Notes (Signed)
Pt refused wound care to R foot and R ankle. Education attempted, Pt refused.

## 2021-11-14 NOTE — Progress Notes (Signed)
Pt completed 2 hour 1st HD treatment. Alert, no c/o, vss, report to primary RN. STart: 1515 End: 1729 No fluid removed 24L BVP 176kg bed post HD weight

## 2021-11-14 NOTE — Progress Notes (Signed)
Physical Therapy Treatment [name] Details Name: [name] MRN: [id number] DOB: [birth year] Today's Date: 11/14/2021   History of Present Illness [name] is a 31yoM who [facility] 10[facility] c abdnomal lab values, noted CK: 369, hyponatremia, 2 weeks of diarrhea and annorrexia.  PMH: HLD, HTN, IDDM, GERD. Mother recently called PCP "concerned about [name] welfare- [name] has multiple health concerns- weight gain, swelling, uncontrolled diabetes, and states he wants to kill himself." Also of note recent fall on October 10th and difficulty getting off ground.    PT Comments    Pt seated EOB on entry, family in room. Pt is remains agitated and impulsive, continues to verbalize strong desire to leave hospital due to low frustration tolerance regarding general unpleasantness of his stay here. Pt not verbalizing any clear acceptance of his current ailments being treated, not verbalizing realistically about his physical ability upon return to home. Much verbal back and forth with his mother who is concerned with pt leaving against recommendations for care. Pt agreeable to session, AMB twice c BRW. He stops halfway due to generalized fatigue/exertion, does not specify. Pt requires 3-4 minutes seated recovery between walking bouts. Pt reminded of where he is and asked to be respectful of other patients and not raise his voice while in the hallway. Pt at EOB at exit, all needs met. RN Estill Bamberg made aware of author's concerns that family may be further agitating [name].      Recommendations for follow up therapy are one component of a multi-disciplinary discharge planning process, led by the attending physician.  Recommendations may be updated based on [name] status, additional functional criteria and insurance authorization.  Follow Up Recommendations  Home health PT     Assistance Recommended at Discharge Set up Supervision/Assistance  [name] can return home with the following Assist  for transportation   Equipment Recommendations  None recommended by PT    Recommendations for Other Services       Precautions / Restrictions Precautions Precautions: Fall Restrictions Weight Bearing Restrictions: No     Mobility  Bed Mobility               General bed mobility comments: sitting EOB    Transfers Overall transfer level: Needs assistance Equipment used:  (BRW) Transfers: Sit to/from Stand Sit to Stand: From elevated surface           General transfer comment: supervision, pt elects to elevate EOB    Ambulation/Gait   Gait Distance (Feet): 50 Feet Assistive device:  (BRW) Gait Pattern/deviations: Step-through pattern       General Gait Details: pauses halfway due to 'tired' but does not iliterate when questioned further   Stairs             Wheelchair Mobility    Modified Rankin (Stroke Patients Only)       Balance Overall balance assessment: Modified Independent, History of Falls                                          Cognition Arousal/Alertness: Awake/alert Behavior During Therapy: Agitated, Impulsive, Anxious                                   General Comments: Pt has difficulty attending to task.constant arguing verbally with family in rom or internal stimuli  Exercises      General Comments        Pertinent Vitals/Pain Pain Assessment Pain Assessment: No/denies pain    Home Living                          Prior Function            PT Goals (current goals can now be found in the care plan section) Acute Rehab PT Goals [name] Stated Goal: return to home PT Goal Formulation: With [name] Time For Goal Achievement: 11/26/21 Potential to Achieve Goals: Good Progress towards PT goals: Progressing toward goals    Frequency    Min 2X/week      PT Plan Current plan remains appropriate    Co-evaluation              AM-PAC PT "6 Clicks"  Mobility   Outcome Measure  Help needed turning from your back to your side while in a flat bed without using bedrails?: A Lot Help needed moving from lying on your back to sitting on the side of a flat bed without using bedrails?: A Lot Help needed moving to and from a bed to a chair (including a wheelchair)?: A Little Help needed standing up from a chair using your arms (e.g., wheelchair or bedside chair)?: A Little Help needed to walk in hospital room?: A Little Help needed climbing 3-5 steps with a railing? : A Lot 6 Click Score: 15    End of Session   Activity Tolerance: [name] tolerated treatment well;No increased pain;[name] limited by fatigue [name] left: with call bell/phone within reach;with family/visitor present Nurse Communication: Mobility status;Other (comment) PT Visit Diagnosis: Other abnormalities of gait and mobility (R26.89);Unsteadiness on feet (R26.81);Difficulty in walking, not elsewhere classified (R26.2)     Time: 5834-6219 PT Time Calculation (min) (ACUTE ONLY): 23 min  Charges:  $Therapeutic Activity: 23-37 mins                    12:24 PM, 11/14/21 Etta Grandchild, PT, DPT Physical Therapist - [facility]  [phone] ([place])    Cook C 11/14/2021, 12:13 PM

## 2021-11-14 NOTE — Progress Notes (Signed)
Pt argumentative with staff at this time, states repeatedly that he is going to take his IV out. Educated pt that IV is being used to administer his lasix and the purpose of lasix to remove excess fluid. Pt remains with IV at this time; will continue to monitor.

## 2021-11-14 NOTE — Progress Notes (Signed)
Pt called 911 requesting a ride home so that he could get his PO Lasix. Charge Nurse and this Probation officer advised the patient that he is receiving IV Lasix and that if he leaves AMA he would not be able to receive his ordered treatments. The Pt agreed to stay the night. Patient currently in bed with his daughter bedside.

## 2021-11-14 NOTE — Progress Notes (Signed)
Occupational Therapy Treatment Patient Details Name: [name] MRN: [id number] DOB: 1965/05/02 Today's Date: 11/14/2021   History of present illness [name] is a 77yoM who comes [facility] on 10/16 c abdnomal lab values, noted CK: 369, hyponatremia, 2 weeks of diarrhea and annorrexia.  PMH: HLD, HTN, IDDM, GERD. Mother recently called PCP "concerned about patient welfare- patient has multiple health concerns- weight gain, swelling, uncontrolled diabetes, and states he wants to kill himself." Also of note recent fall on October 10th and difficulty getting off ground.   OT comments  [name] is somewhat anxious this AM, repeatedly saying that he wants to leave ASAP and that he doesn't feel he is benefiting from his hospital stay. Pt requires Mod A for bed mobility and sit<>stand with RW, demonstrates fair standing balance, with heavy reliance on RW to maintain UB weight. Pt performs grooming and eating in sitting, requiring frequent cueing to initiate and continue with grooming tasks. Pt able to [employer] containers and grooming items INDly. Provided educ re following PoC. Pt left sitting EOB, RN aware. Pt will benefit from [place] post DC.      Recommendations for follow up therapy are one component of a multi-disciplinary discharge planning process, led by the attending physician.  Recommendations may be updated based on patient status, additional functional criteria and insurance authorization.    Follow Up Recommendations  Home health OT    Assistance Recommended at Discharge Frequent or constant Supervision/Assistance  Patient can return home with the following  A little help with walking and/or transfers;A lot of help with bathing/dressing/bathroom;Help with stairs or ramp for entrance;Direct supervision/assist for medications management   Equipment Recommendations  None recommended by OT    Recommendations for Other Services      Precautions / Restrictions  Precautions Precautions: Fall Restrictions Weight Bearing Restrictions: No       Mobility Bed Mobility Overal bed mobility: Needs Assistance Bed Mobility: Supine to Sit     Supine to sit: Min assist     General bed mobility comments: Reports buttocks pain with bed mobility    Transfers Overall transfer level: Needs assistance Equipment used: Rolling walker (2 wheels) Transfers: Sit to/from Stand Sit to Stand: Mod assist                 Balance Overall balance assessment: Needs assistance Sitting-balance support: No upper extremity supported, Feet supported Sitting balance-Leahy Scale: Good Sitting balance - Comments: Pt falls asleep in sitting, with head resting on bedside table   Standing balance support: Bilateral upper extremity supported Standing balance-Leahy Scale: Fair                             ADL either performed or assessed with clinical judgement   ADL Overall ADL's : Needs assistance/impaired Eating/Feeding: Modified independent;Set up   Grooming: Oral care;Wash/dry face;Wash/dry hands;Set up;Bed level;Minimal assistance Grooming Details (indicate cue type and reason): frequent cueing to attend to task                   Toilet Transfer Details (indicate cue type and reason): Pt reports he got up [facility] multiple times during the night to use the restroom. Unable to verify this information.           General ADL Comments: Anticipate Mod A + RW for OOB mobility    Extremity/Trunk Assessment Upper Extremity Assessment Upper Extremity Assessment: Overall WFL for tasks assessed  Lower Extremity Assessment Lower Extremity Assessment: Generalized weakness        Vision       Perception     Praxis      Cognition Arousal/Alertness: Awake/alert Behavior During Therapy: Agitated Overall Cognitive Status: No family/caregiver present to determine baseline cognitive functioning                                  General Comments: Pt has difficulty attending to task, repeatedly states he is unhappy with his level of care and wants to [facility] hospital        Exercises Other Exercises Other Exercises: Educ re: PoC, importance of contining with care plan, falls prevention    Shoulder Instructions       General Comments      Pertinent Vitals/ Pain       Pain Assessment Pain Assessment: 0-10 Pain Score: 5  Pain Location: buttocks Pain Descriptors / Indicators: Aching, Tender Pain Intervention(s): Repositioned  Home Living                                          Prior Functioning/Environment              Frequency  Min 1X/week        Progress Toward Goals  OT Goals(current goals can now be found in the care plan section)  Progress towards OT goals: Progressing toward goals  Acute Rehab OT Goals OT Goal Formulation: With patient Time For Goal Achievement: 11/26/21 Potential to Achieve Goals: Good  Plan Discharge plan remains appropriate    Co-evaluation                 AM-PAC OT "6 Clicks" Daily Activity     Outcome Measure   Help from another person eating meals?: None Help from another person taking care of personal grooming?: A Little Help from another person toileting, which includes using toliet, bedpan, or urinal?: A Little Help from another person bathing (including washing, rinsing, drying)?: A Little Help from another person to put on and taking off regular upper body clothing?: None Help from another person to put on and taking off regular lower body clothing?: A Lot 6 Click Score: 19    End of Session Equipment Utilized During Treatment: Rolling walker (2 wheels)  OT Visit Diagnosis: Other abnormalities of gait and mobility (R26.89);Muscle weakness (generalized) (M62.81)   Activity Tolerance Patient tolerated treatment well   Patient Left in bed;with call bell/phone within reach   Nurse Communication          Time:  8185-6314 OT Time Calculation (min): 15 min  Charges: OT General Charges $OT Visit: 1 Visit OT Treatments $Self Care/Home Management : 8-22 mins Josiah Lobo, PhD, MS, OTR/L 11/14/21, 10:47 AM

## 2021-11-14 NOTE — Progress Notes (Signed)
Post hd rn assessment 

## 2021-11-14 NOTE — Progress Notes (Signed)
Report on patient from Helene Kelp, RN.

## 2021-11-14 NOTE — Progress Notes (Signed)
Pt increased his Lasix drip from 1.72ml/hr to 4.5 ml/hr at approximately 0330. This was discovered by this Probation officer during rounds at [phone]. Pt found not to be experiencing any acute distress, VS baseline. Reduced Lasix drip back to ordered rate of 1.33ml/hr and locked the IV pump. Notified the cross-cover NP and the Nursing [facility]. New orders for labs obtained. Pt very argumentative and non-compliant with care. Pt sitting in chair watching TV.

## 2021-11-14 NOTE — Progress Notes (Signed)
Pt requested wound care be performed to his foot. Wound care performed. Ted stockings placed on BLE.

## 2021-11-14 NOTE — Progress Notes (Signed)
Notified by bedside RN that patient had returned from HD and wished to leave the hospital tonight.    He is not appropriate for discharge at this time.  Multiple providers, including myself and nephrology, have discussed with patient and mother at length regarding the severity of his renal failure and the very substantial risks of clinical decompensation and death should he not choose to proceed with recommended treatment.  He has decision making capability, and will be allowed to leave AMA should he choose  RN will need to discontinue temporary dialysis catheter prior to patient leaving  Ralene Muskrat MD

## 2021-11-14 NOTE — Progress Notes (Signed)
Pt returned from HD and stated that he wished to leave. MD notified. Per MD pt is not in condition medically to d/c. Discussed with and educated pt that it was not recommended that he leave the hospital d/t renal failure and potential consequence of ending medical treatment at this time. Pt verbalizes understanding and stated he wished to leave AMA. Pt provided with AMA papers. HD cath and IV removed.

## 2021-11-14 NOTE — Progress Notes (Signed)
PROGRESS NOTE    [name]  JJO:[id number] DOB: [birth year] DOA: 11/10/2021 PCP: [name], DO    Brief Narrative:  56 year old male morbid obesity, hyperlipidemia, hypertension, insulin-dependent diabetes mellitus, GERD, who presents emergency department for chief concerns of abnormal labs.  Patient has been on gentle intravenous fluids given volume overloaded state.  This does not result in an improvement in serum creatinine.  Discussed with nephrology on 10/18.  Patient is adamantly refusing renal replacement therapy.  Requesting his diuretics.  We will attempt to treat as cardiorenal syndrome and restart home diuretics.  Monitor volume status carefully.  10/20: After much conversation the patient has agreed to placement of temporary dialysis catheter and initiation of HD.  Plan for vascular procedure today.  Plan for HD today and tomorrow.  Patient remains reluctant to adhere to medical therapy.  Apparently he dialed 911 twice during the course of the night.  Assessment & Plan:   Principal Problem:   Acute kidney injury superimposed on CKD [facility]) Active Problems:   Morbid obesity with BMI of 45.0-49.9, adult (HCC)   Benign hypertension with CKD (chronic kidney disease) stage IV (HCC)   Proteinuria   Type 2 diabetes mellitus, controlled, with renal complications (HCC)   Secondary renal hyperparathyroidism (HCC)   Tobacco abuse, episodic   Hyperlipidemia associated with type 2 diabetes mellitus ([place])   Current tobacco use   GERD (gastroesophageal reflux disease)   Chronic pain syndrome   Diarrhea   Volume overload   Hypotension   Hyponatremia   Thrombocytopenia (HCC)   AKI (acute kidney injury) ([place])   Pressure injury of skin  * Acute kidney injury superimposed on CKD (HCC) Fluid overload Creatinine continues to deteriorate.  Patient remains volume overloaded.  Starting to exhibit signs of uremic encephalopathy.  Agreeable to renal replacement  therapy Plan: Vascular consulted.  Plan for temporary dialysis catheter insertion today and initiation of HD.  Patient will undergo HD today and tomorrow.   Thrombocytopenia ([place]) Continue to monitor.  Secondary to ESRD   Hyponatremia Secondary to dehydration and poor p.o. intake.  Volume overload likely contributing factor  Hypotension Hold antihypertensive medications currently   Diarrhea Stool studies negative.  Diet fluid restriction however patient has been nonadherent   Chronic pain syndrome Tramadol is not a good medication with acute kidney injury.  Careful with this medication.  Monitor mental status carefully before administration of any sedative or narcotic medication   GERD (gastroesophageal reflux disease) - PPI   Current tobacco use - PRN nicotine patch   Hyperlipidemia associated with type 2 diabetes mellitus ([place]) Continue atorvastatin   Type 2 diabetes mellitus, controlled, with renal complications (HCC) Holding long-acting insulin and using sliding scale only.   Benign hypertension with CKD (chronic kidney disease) stage IV (HCC) Holding Coreg   Morbid obesity with BMI of 45.0-49.9, adult ([place]) - Patient has BMI of 47.29 with current height and weight in computer.  This complicates overall care and prognosis.  DVT prophylaxis: SQ heparin Code Status: Full Family Communication: Mother at bedside 10/19 Disposition Plan: Status is: Inpatient Remains inpatient appropriate because: Severe AKI on CKD   Level of care: Med-Surg  Consultants:  Nephrology Palliative care  Procedures:  None  Antimicrobials: None   Subjective: Seen and examined.  Continues to be upset and agitated  Objective: Vitals:   11/14/21 0422 11/14/21 0425 11/14/21 0812 11/14/21 1201  BP:  (!) 139/92 (!) 151/106 108/62  Pulse: 92 90 96 92  Resp:  19 18 18   Temp:  98.1 F (36.7 C) (!) 97.4 F (36.3 C) 98.5 F (36.9 C)  TempSrc:      SpO2: 96% 94% 93% 100%  Weight:       Height:        Intake/Output Summary (Last 24 hours) at 11/14/2021 1408 Last data filed at 11/14/2021 0900 Gross per 24 hour  Intake 1243.31 ml  Output 1 ml  Net 1242.31 ml   Filed Weights   11/11/21 1119  Weight: (!) 153.8 kg    Examination:  General exam: Upset, agitated Respiratory system: Bibasilar crackles.  Scattered wheeze.  No work of breathing.  2 L Cardiovascular system: S1-S2, RRR, no murmurs, 3+ pitting edema BLE Gastrointestinal system: Obese, NT/ND, normal bowel sounds Central nervous system: Alert and oriented. No focal neurological deficits. Extremities: Symmetric 5 x 5 power. Skin: No rashes, lesions or ulcers Psychiatry: Judgement and insight appear normal. Mood & affect appropriate.     Data Reviewed: I have personally reviewed following labs and imaging studies  CBC: Recent Labs  Lab 11/10/21 2303 11/11/21 0859 11/12/21 0441 11/13/21 0829 11/14/21 0440  WBC 4.4 4.5 4.8 4.9 4.7  HGB 10.9* 10.7* 10.7* 10.9* 9.9*  HCT 36.8* 36.2* 35.2* 37.1* 33.7*  MCV 101.7* 102.0* 103.5* 105.7* 105.3*  PLT 90* 96* 102* 100* 834*   Basic Metabolic Panel: Recent Labs  Lab 11/10/21 2303 11/11/21 0859 11/12/21 0441 11/13/21 0829 11/14/21 0440  NA 125* 127* 131* 135 135  K 3.7 3.7 3.9 3.9 3.7  CL 83* 87* 92* 96* 96*  CO2 30 29 30 29 29   GLUCOSE 102* 146* 102* 112* 118*  BUN 101* 112* 115* 124* 128*  CREATININE 8.22* 8.15* 8.26* 8.66* 8.37*  CALCIUM 8.0* 8.1* 8.1* 8.5* 8.2*  MG  --   --   --   --  1.9   GFR: Estimated Creatinine Clearance: 15.2 mL/min (A) (by C-G formula based on SCr of 8.37 mg/dL (H)). Liver Function Tests: Recent Labs  Lab 11/10/21 1404  AST 61*  ALT 55*  ALKPHOS 468*  BILITOT 3.4*  PROT 5.8*  ALBUMIN 2.1*   Recent Labs  Lab 11/10/21 1404  LIPASE 97*   Recent Labs  Lab 11/10/21 2303  AMMONIA 52*   Coagulation Profile: No results for input(s): "INR", "PROTIME" in the last 168 hours. Cardiac Enzymes: Recent Labs   Lab 11/10/21 1404  CKTOTAL 369   BNP (last 3 results) No results for input(s): "PROBNP" in the last 8760 hours. HbA1C: No results for input(s): "HGBA1C" in the last 72 hours. CBG: Recent Labs  Lab 11/13/21 1232 11/13/21 1647 11/13/21 2101 11/14/21 0737 11/14/21 1157  GLUCAP 118* 129* 157* 102* 163*   Lipid Profile: No results for input(s): "CHOL", "HDL", "LDLCALC", "TRIG", "CHOLHDL", "LDLDIRECT" in the last 72 hours. Thyroid Function Tests: No results for input(s): "TSH", "T4TOTAL", "FREET4", "T3FREE", "THYROIDAB" in the last 72 hours. Anemia Panel: No results for input(s): "VITAMINB12", "FOLATE", "FERRITIN", "TIBC", "IRON", "RETICCTPCT" in the last 72 hours. Sepsis Labs: No results for input(s): "PROCALCITON", "LATICACIDVEN" in the last 168 hours.  Recent Results (from the past 240 hour(s))  Gastrointestinal Panel by PCR , Stool     Status: None   Collection Time: 11/10/21 11:03 PM   Specimen: Stool  Result Value Ref Range Status   Campylobacter species NOT DETECTED NOT DETECTED Final   Plesimonas shigelloides NOT DETECTED NOT DETECTED Final   Salmonella species NOT DETECTED NOT DETECTED Final   Yersinia enterocolitica NOT DETECTED  NOT DETECTED Final   Vibrio species NOT DETECTED NOT DETECTED Final   Vibrio cholerae NOT DETECTED NOT DETECTED Final   Enteroaggregative E coli (EAEC) NOT DETECTED NOT DETECTED Final   Enteropathogenic E coli (EPEC) NOT DETECTED NOT DETECTED Final   Enterotoxigenic E coli (ETEC) NOT DETECTED NOT DETECTED Final   Shiga like toxin producing E coli (STEC) NOT DETECTED NOT DETECTED Final   Shigella/Enteroinvasive E coli (EIEC) NOT DETECTED NOT DETECTED Final   Cryptosporidium NOT DETECTED NOT DETECTED Final   Cyclospora cayetanensis NOT DETECTED NOT DETECTED Final   Entamoeba histolytica NOT DETECTED NOT DETECTED Final   Giardia lamblia NOT DETECTED NOT DETECTED Final   Adenovirus F40/41 NOT DETECTED NOT DETECTED Final   Astrovirus NOT  DETECTED NOT DETECTED Final   Norovirus GI/GII NOT DETECTED NOT DETECTED Final   Rotavirus A NOT DETECTED NOT DETECTED Final   Sapovirus (I, II, IV, and V) NOT DETECTED NOT DETECTED Final    Comment: Performed at [facility], [address] [place], [place] [address]  C Difficile Quick Screen w PCR reflex     Status: None   Collection Time: 11/10/21 11:03 PM   Specimen: Stool  Result Value Ref Range Status   C Diff antigen NEGATIVE NEGATIVE Final   C Diff toxin NEGATIVE NEGATIVE Final   C Diff interpretation No C. difficile detected.  Final    Comment: Performed at [facility], [address] [place], [place] [address]  SARS Coronavirus 2 by RT PCR (hospital order, performed in [facility] hospital lab) *cepheid single result test* Anterior Nasal Swab     Status: None   Collection Time: 11/10/21 11:03 PM   Specimen: Anterior Nasal Swab  Result Value Ref Range Status   SARS Coronavirus 2 by RT PCR NEGATIVE NEGATIVE Final    Comment: (NOTE) SARS-CoV-2 target nucleic acids are NOT DETECTED.  The SARS-CoV-2 RNA is generally detectable in upper and lower respiratory specimens during the acute phase of infection. The lowest concentration of SARS-CoV-2 viral copies this assay can detect is 250 copies / mL. A negative result does not preclude SARS-CoV-2 infection and should not be used as the sole basis for treatment or other patient management decisions.  A negative result may occur with improper specimen collection / handling, submission of specimen other than nasopharyngeal swab, presence of viral mutation(s) within the areas targeted by this assay, and inadequate number of viral copies (<250 copies / mL). A negative result must be combined with clinical observations, patient history, and epidemiological information.  Fact Sheet for Patients:   https://www.patel.info/  Fact Sheet for Healthcare  Providers: https://hall.com/  This test is not yet approved or  cleared by the [place] FDA and has been authorized for detection and/or diagnosis of SARS-CoV-2 by FDA under an Emergency Use Authorization (EUA).  This EUA will remain in effect (meaning this test can be used) for the duration of the COVID-19 declaration under Section 564(b)(1) of the Act, 21 U.S.C. section 360bbb-3(b)(1), unless the authorization is terminated or revoked sooner.  Performed at [facility], [address] [place], [place] [address]          Radiology Studies: [facility] Chest [place] 1 View  Result Date: 11/13/2021 CLINICAL DATA:  ATN.  History of asthma. EXAM: PORTABLE CHEST 1 VIEW COMPARISON:  Chest radiograph 11/10/2021 FINDINGS: No pleural effusion. Pneumothorax. Redemonstrated cardiomegaly with associated mild pulmonary edema which has slightly increased from prior exam. No displaced rib fractures. No focal airspace  opacity. Visualized upper abdomen is unremarkable. IMPRESSION: Cardiomegaly with associated mild pulmonary edema, slightly increased from prior exam. No pleural effusion. Electronically Signed   By: Marin Roberts M.D.   On: 11/13/2021 11:27        Scheduled Meds:  [MAR Hold] aspirin  81 mg Oral Daily   [MAR Hold] atorvastatin  20 mg Oral Daily   [MAR Hold] Chlorhexidine Gluconate Cloth  6 each Topical Q0600   [MAR Hold] heparin  5,000 Units Subcutaneous Q8H   [MAR Hold] insulin aspart  0-5 Units Subcutaneous QHS   [MAR Hold] insulin aspart  0-9 Units Subcutaneous TID WC   [MAR Hold] lidocaine  1 patch Transdermal Q24H   [MAR Hold] liver oil-zinc oxide   Topical TID   [MAR Hold] methocarbamol  500 mg Oral TID   [MAR Hold] nystatin   Topical TID   [MAR Hold] pantoprazole  80 mg Oral BID   Continuous Infusions:  furosemide (LASIX) 200 mg in dextrose 5 % 100 mL (2 mg/mL) infusion 3 mg/hr (11/13/21 2157)     LOS: 4 days     Sidney Ace,  MD [facility]   If 7PM-7AM, please contact night-coverage  11/14/2021, 2:08 PM

## 2021-11-14 NOTE — Consult Note (Signed)
[facility] Consult Note  MRN : [id number]  GARL SPEIGNER is a 56 y.o. (28-Aug-1965) male who presents with chief complaint of  Chief Complaint  Patient presents with   Diarrhea   Wound Check  .  History of Present Illness: Patient is admitted to the hospital with diarrhea, wounds and fluid overload and has developed acute renal failure.  The patient reports longstanding CKD.  The patient is critically ill. The nephrology service has decided to initiate dialysis at this time, and we are asked to place a temporary dialysis catheter for immediate dialysis use.    Current Facility-Administered Medications  Medication Dose Route Frequency Provider Last Rate Last Admin   [MAR Hold] acetaminophen (TYLENOL) tablet 650 mg  650 mg Oral Q6H PRN Cox, Amy N, DO   650 mg at 11/11/21 0333   Or   [MAR Hold] acetaminophen (TYLENOL) suppository 650 mg  650 mg Rectal Q6H PRN Cox, Amy N, DO       [MAR Hold] albuterol (PROVENTIL) (2.5 MG/3ML) 0.083% nebulizer solution 2.5 mg  2.5 mg Nebulization Q6H PRN Cox, Amy N, DO       [MAR Hold] aspirin chewable tablet 81 mg  81 mg Oral Daily Cox, Amy N, DO   81 mg at 11/14/21 1001   [MAR Hold] atorvastatin (LIPITOR) tablet 20 mg  20 mg Oral Daily Cox, Amy N, DO   20 mg at 11/14/21 1001   [MAR Hold] Chlorhexidine Gluconate Cloth 2 % PADS 6 each  6 each Topical Q0600 Breeze, Shantelle, NP       furosemide (LASIX) 200 mg in dextrose 5 % 100 mL (2 mg/mL) infusion  3 mg/hr Intravenous Continuous Colon Flattery, NP 1.5 mL/hr at 11/13/21 2157 3 mg/hr at 11/13/21 2157   [facility] Hold] heparin injection 5,000 Units  5,000 Units Subcutaneous Q8H Cox, Amy N, DO   5,000 Units at 11/14/21 1204   [MAR Hold] insulin aspart (novoLOG) injection 0-5 Units  0-5 Units Subcutaneous QHS Cox, Amy N, DO       [MAR Hold] insulin aspart (novoLOG) injection 0-9 Units  0-9 Units Subcutaneous TID WC Cox, Amy N, DO   2 Units at 11/14/21 1204   [MAR Hold]  lidocaine (LIDODERM) 5 % 1 patch  1 patch Transdermal Q24H Foust, Katy L, NP   1 patch at 11/14/21 0604   lidocaine (PF) (XYLOCAINE) 1 % injection    PRN Algernon Huxley, MD   5 mL at 11/14/21 1403   [MAR Hold] liver oil-zinc oxide (DESITIN) 40 % ointment   Topical TID Ralene Muskrat B, MD   Given at 11/14/21 1001   [MAR Hold] methocarbamol (ROBAXIN) tablet 500 mg  500 mg Oral TID Ralene Muskrat B, MD   500 mg at 11/14/21 1001   [MAR Hold] nicotine (NICODERM CQ - dosed in mg/24 hours) patch 21 mg  21 mg Transdermal Daily PRN Cox, Amy N, DO       [MAR Hold] nystatin (MYCOSTATIN/NYSTOP) topical powder   Topical TID Ralene Muskrat B, MD   Given at 11/14/21 1001   [MAR Hold] ondansetron (ZOFRAN) tablet 4 mg  4 mg Oral Q6H PRN Cox, Amy N, DO       Or   [MAR Hold] ondansetron (ZOFRAN) injection 4 mg  4 mg Intravenous Q6H PRN Cox, Amy N, DO       [MAR Hold] oxyCODONE (Oxy IR/ROXICODONE) immediate release tablet 5 mg  5 mg Oral Q6H PRN Priscella Mann, [employer]  B, MD   5 mg at 11/12/21 2016   [MAR Hold] pantoprazole (PROTONIX) EC tablet 80 mg  80 mg Oral BID Cox, Amy N, DO   80 mg at 11/14/21 1001   [MAR Hold] senna-docusate (Senokot-S) tablet 1 tablet  1 tablet Oral QHS PRN Cox, Amy N, DO        Past Medical History:  Diagnosis Date   Allergy    Asthma    Gout    Hypertension    Morbid obesity ([place])     History reviewed. No pertinent surgical history.  Social History   Tobacco Use   Smoking status: Never   Smokeless tobacco: Never  Substance Use Topics   Alcohol use: Yes    Comment: a glass of wine and a couple beers a day   Drug use: No     Family History  Problem Relation Age of Onset   Diabetes Mother    Hypertension Mother    Alcohol abuse Father    Glaucoma Father    Diabetes Father    Hypertension Father    Cancer Neg Hx    COPD Neg Hx    Heart disease Neg Hx    Stroke Neg Hx     Allergies  Allergen Reactions   Talc      REVIEW OF SYSTEMS (Negative unless  checked)  Constitutional: [] Weight loss  [] Fever  [] Chills Cardiac: [] Chest pain   [] Chest pressure   [] Palpitations   [] Shortness of breath when laying flat   [] Shortness of breath at rest   [x] Shortness of breath with exertion. Vascular:  [] Pain in legs with walking   [] Pain in legs at rest   [] Pain in legs when laying flat   [] Claudication   [] Pain in feet when walking  [] Pain in feet at rest  [] Pain in feet when laying flat   [] History of DVT   [] Phlebitis   [x] Swelling in legs   [] Varicose veins   [] Non-healing ulcers Pulmonary:   [] Uses home oxygen   [] Productive cough   [] Hemoptysis   [] Wheeze  [] COPD   [] Asthma Neurologic:  [] Dizziness  [] Blackouts   [] Seizures   [] History of stroke   [] History of TIA  [] Aphasia   [] Temporary blindness   [] Dysphagia   [] Weakness or numbness in arms   [] Weakness or numbness in legs Musculoskeletal:  [] Arthritis   [] Joint swelling   [x] Joint pain   [] Low back pain Hematologic:  [] Easy bruising  [] Easy bleeding   [] Hypercoagulable state   [] Anemic  [] Hepatitis Gastrointestinal:  [] Blood in stool   [] Vomiting blood  [] Gastroesophageal reflux/heartburn   [] Difficulty swallowing. Genitourinary:  [x] Chronic kidney disease   [] Difficult urination  [] Frequent urination  [] Burning with urination   [] Blood in urine Skin:  [] Rashes   [] Ulcers   [] Wounds Psychological:  [] History of anxiety   []  History of major depression.       Physical Examination  Vitals:   11/14/21 0422 11/14/21 0425 11/14/21 0812 11/14/21 1201  BP:  (!) 139/92 (!) 151/106 108/62  Pulse: 92 90 96 92  Resp:  19 18 18   Temp:  98.1 F (36.7 C) (!) 97.4 F (36.3 C) 98.5 F (36.9 C)  TempSrc:      SpO2: 96% 94% 93% 100%  Weight:      Height:       Body mass index is 47.29 kg/m. Gen: WD/WN. Morbidly obese Head: [place]/AT, No temporalis wasting Ear/Nose/Throat: Hearing grossly intact, nares w/o erythema or drainage Eyes: Sclera non-icteric,  conjunctiva clear Neck: Supple, no nuchal  rigidity.  No JVD.  Pulmonary:  Good air movement, clear to auscultation bilaterally.  Cardiac: RRR, no JVD  Gastrointestinal: soft, non-tender/non-distended. No guarding/reflex.  Musculoskeletal: M/S 5/5 throughout.  Extremities without ischemic changes.  No deformity or atrophy. Mild Edema in the lower extremities bilaterally Neurologic: A and O x 3, nonfocal Psychiatric: Difficult to assess due to the severity of patient's illness. Dermatologic: No rashes or ulcers noted.   Lymph : No Cervical, Axillary, or Inguinal lymphadenopathy.     CBC Lab Results  Component Value Date   WBC 4.7 11/14/2021   HGB 9.9 (L) 11/14/2021   HCT 33.7 (L) 11/14/2021   MCV 105.3 (H) 11/14/2021   PLT 103 (L) 11/14/2021    BMET    Component Value Date/Time   NA 135 11/14/2021 0440   NA 142 08/07/2014 0848   K 3.7 11/14/2021 0440   CL 96 (L) 11/14/2021 0440   CO2 29 11/14/2021 0440   GLUCOSE 118 (H) 11/14/2021 0440   BUN 128 (H) 11/14/2021 0440   BUN 23 08/07/2014 0848   CREATININE 8.37 (H) 11/14/2021 0440   CREATININE 2.10 (H) 05/29/2021 0923   CALCIUM 8.2 (L) 11/14/2021 0440   GFRNONAA 7 (L) 11/14/2021 0440   GFRNONAA 42 (L) 10/05/2018 0755   GFRAA 48 (L) 10/05/2018 0755   Estimated Creatinine Clearance: 15.2 mL/min (A) (by C-G formula based on SCr of 8.37 mg/dL (H)).  COAG Lab Results  Component Value Date   INR 1.20 02/28/2018   INR 1.02 10/04/2016    Radiology PERIPHERAL VASCULAR CATHETERIZATION  Result Date: 11/14/2021 See surgical note for result.  DG Chest Port 1 View  Result Date: 11/13/2021 CLINICAL DATA:  ATN.  History of asthma. EXAM: PORTABLE CHEST 1 VIEW COMPARISON:  Chest radiograph 11/10/2021 FINDINGS: No pleural effusion. Pneumothorax. Redemonstrated cardiomegaly with associated mild pulmonary edema which has slightly increased from prior exam. No displaced rib fractures. No focal airspace opacity. Visualized upper abdomen is unremarkable. IMPRESSION:  Cardiomegaly with associated mild pulmonary edema, slightly increased from prior exam. No pleural effusion. Electronically Signed   By: Marin Roberts M.D.   On: 11/13/2021 11:27   ECHOCARDIOGRAM COMPLETE  Result Date: 11/11/2021    ECHOCARDIOGRAM REPORT   Patient Name:   [name] Date of Exam: 11/11/2021 Medical Rec #:  [id number]        Height:       71.0 in Accession #:    [id number]       Weight:       339.1 lb Date of Birth:  July 28, 1965       BSA:          2.639 m Patient Age:    74 years         BP:           111/73 mmHg Patient Gender: M                HR:           76 bpm. Exam Location:  [facility] Procedure: 2D Echo, Limited Color Doppler and Cardiac Doppler Indications:     R06.00 Dyspnea  History:         Patient has prior history of Echocardiogram examinations, most                  recent 04/30/2018. Risk Factors:Hypertension.  Sonographer:     Charmayne Sheer Referring Phys:  [id number] AMY N COX Diagnosing Phys: Darnell Level  Nehemiah Massed MD  Sonographer Comments: No apical window. Image acquisition challenging due to patient body habitus and Image acquisition challenging due to uncooperative patient. IMPRESSIONS  1. Left ventricular ejection fraction, by estimation, is 60 to 65%. The left ventricle has normal function. The left ventricle has no regional wall motion abnormalities. Left ventricular diastolic parameters were normal.  2. Right ventricular systolic function is normal. The right ventricular size is normal.  3. The mitral valve is normal in structure. Trivial mitral valve regurgitation.  4. The aortic valve is normal in structure. Aortic valve regurgitation is trivial. FINDINGS  Left Ventricle: Left ventricular ejection fraction, by estimation, is 60 to 65%. The left ventricle has normal function. The left ventricle has no regional wall motion abnormalities. The left ventricular internal cavity size was normal in size. There is  no left ventricular hypertrophy. Left ventricular diastolic parameters were  normal. Right Ventricle: The right ventricular size is normal. No increase in right ventricular wall thickness. Right ventricular systolic function is normal. Left Atrium: Left atrial size was normal in size. Right Atrium: Right atrial size was normal in size. Pericardium: There is no evidence of pericardial effusion. Mitral Valve: The mitral valve is normal in structure. Trivial mitral valve regurgitation. Tricuspid Valve: The tricuspid valve is normal in structure. Tricuspid valve regurgitation is trivial. Aortic Valve: The aortic valve is normal in structure. Aortic valve regurgitation is trivial. Pulmonic Valve: The pulmonic valve was normal in structure. Pulmonic valve regurgitation is trivial. Aorta: The aortic root and ascending aorta are structurally normal, with no evidence of dilitation. IAS/Shunts: No atrial level shunt detected by color flow Doppler.  LEFT VENTRICLE PLAX 2D LVIDd:         5.30 cm LVIDs:         4.50 cm LV PW:         1.80 cm LV IVS:        1.30 cm LVOT diam:     2.80 cm LVOT Area:     6.16 cm                        PULMONIC VALVE AORTA                 PV Vmax:          1.12 m/s Ao Root diam: 4.20 cm PV Vmean:         73.000 cm/s                       PV VTI:           0.182 m                       PV Peak grad:     5.0 mmHg                       PV Mean grad:     3.0 mmHg                       PR End Diast Vel: 6.15 msec  MITRAL VALVE MV Area (PHT): 4.80 cm    SHUNTS MV Decel Time: 158 msec    Systemic Diam: 2.80 cm MV E velocity: 66.00 cm/s MV A velocity: 70.70 cm/s MV E/A ratio:  0.93 Serafina Royals MD Electronically signed by Serafina Royals MD Signature Date/Time: 11/11/2021/5:05:52 PM    Final  US RENAL  Result Date: 11/11/2021 CLINICAL DATA:  Acute kidney injury EXAM: RENAL / URINARY TRACT ULTRASOUND COMPLETE COMPARISON:  Ultrasound 02/28/2018 FINDINGS: Right Kidney: Renal measurements: 10.9 x 6.0 x 5.2 cm = volume: 177 mL. Echogenicity within normal limits. No mass or  hydronephrosis visualized. Left Kidney: Renal measurements: 10.4 x 7.0 x 6.1 cm = volume: 233 mL. Echogenicity within normal limits. No mass or hydronephrosis visualized. Bladder: Appears normal for degree of bladder distention. Other: Incidental note of sludge and stones within the gallbladder. IMPRESSION: Unremarkable ultrasound of the kidneys. Cholelithiasis. Electronically Signed   By: Placido Sou M.D.   On: 11/11/2021 00:21   DG Chest Portable 1 View  Result Date: 11/10/2021 CLINICAL DATA:  Crackles EXAM: PORTABLE CHEST 1 VIEW COMPARISON:  03/02/2018 FINDINGS: Cardiomegaly. Pulmonary vascular congestion. Diffuse bilateral interstitial prominence. No large pleural fluid collection. No pneumothorax. IMPRESSION: Cardiomegaly with pulmonary vascular congestion and interstitial edema. Electronically Signed   By: Davina Poke D.O.   On: 11/10/2021 20:36   CT Head Wo Contrast  Result Date: 11/10/2021 CLINICAL DATA:  Altered mental status, neck trauma EXAM: CT HEAD WITHOUT CONTRAST CT CERVICAL SPINE WITHOUT CONTRAST TECHNIQUE: Multidetector CT imaging of the head and cervical spine was performed following the standard protocol without intravenous contrast. Multiplanar CT image reconstructions of the cervical spine were also generated. RADIATION DOSE REDUCTION: This exam was performed according to the departmental dose-optimization program which includes automated exposure control, adjustment of the mA and/or kV according to patient size and/or use of iterative reconstruction technique. COMPARISON:  None Available. FINDINGS: CT HEAD FINDINGS Brain: No evidence of acute infarct, hemorrhage, mass, mass effect, or midline shift. No hydrocephalus or extra-axial fluid collection. Vascular: No hyperdense vessel. Skull: Normal. Negative for fracture or focal lesion. Sinuses/Orbits: Air-fluid level with bubbly fluid in the left maxillary sinus with mucosal thickening. The orbits are unremarkable. Other: The  mastoid air cells are well aerated. CT CERVICAL SPINE FINDINGS Alignment: No listhesis. Mild reversal of the normal cervical lordosis. Skull base and vertebrae: No acute fracture. No primary bone lesion or focal pathologic process. Soft tissues and spinal canal: No prevertebral fluid or swelling. No visible canal hematoma. Disc levels: Mild degenerative changes. No high-grade spinal canal stenosis. Upper chest: Minimally included in the field of view. No acute finding. Other: None. IMPRESSION: 1. No acute intracranial process. 2. No acute fracture or traumatic listhesis in the cervical spine. 3. Air-fluid level with bubbly fluid in the left maxillary sinus, which can be seen in the setting of acute sinusitis. Electronically Signed   By: Merilyn Baba M.D.   On: 11/10/2021 15:53   CT Cervical Spine Wo Contrast  Result Date: 11/10/2021 CLINICAL DATA:  Altered mental status, neck trauma EXAM: CT HEAD WITHOUT CONTRAST CT CERVICAL SPINE WITHOUT CONTRAST TECHNIQUE: Multidetector CT imaging of the head and cervical spine was performed following the standard protocol without intravenous contrast. Multiplanar CT image reconstructions of the cervical spine were also generated. RADIATION DOSE REDUCTION: This exam was performed according to the departmental dose-optimization program which includes automated exposure control, adjustment of the mA and/or kV according to patient size and/or use of iterative reconstruction technique. COMPARISON:  None Available. FINDINGS: CT HEAD FINDINGS Brain: No evidence of acute infarct, hemorrhage, mass, mass effect, or midline shift. No hydrocephalus or extra-axial fluid collection. Vascular: No hyperdense vessel. Skull: Normal. Negative for fracture or focal lesion. Sinuses/Orbits: Air-fluid level with bubbly fluid in the left maxillary sinus with mucosal thickening. The orbits are unremarkable. Other:  The mastoid air cells are well aerated. CT CERVICAL SPINE FINDINGS Alignment: No  listhesis. Mild reversal of the normal cervical lordosis. Skull base and vertebrae: No acute fracture. No primary bone lesion or focal pathologic process. Soft tissues and spinal canal: No prevertebral fluid or swelling. No visible canal hematoma. Disc levels: Mild degenerative changes. No high-grade spinal canal stenosis. Upper chest: Minimally included in the field of view. No acute finding. Other: None. IMPRESSION: 1. No acute intracranial process. 2. No acute fracture or traumatic listhesis in the cervical spine. 3. Air-fluid level with bubbly fluid in the left maxillary sinus, which can be seen in the setting of acute sinusitis. Electronically Signed   By: Merilyn Baba M.D.   On: 11/10/2021 15:53      Assessment/Plan 1. ARF    We will proceed with temporary dialysis catheter placement at this time.  Risks and benefits discussed with patient and/or family, and the catheter will be placed to allow immediate initiation of dialysis.  If the patient's renal function does not improve throughout the hospital course, we will be happy to place a tunneled dialysis catheter for long term use prior to discharge.     Leotis Pain, MD  11/14/2021 2:19 PM

## 2021-11-14 NOTE — Progress Notes (Addendum)
[name]  MRN: [id number]  DOB/AGE: 55-Oct-1967 55 y.o.  Primary Care Physician:[name], [name], DO  Admit date: 11/10/2021  Chief Complaint:  Chief Complaint  Patient presents with   Diarrhea   Wound Check    S-Pt presented on  11/10/2021 with  Chief Complaint  Patient presents with   Diarrhea   Wound Check  . Patient is a 56 year old African-American male with a past medical history of morbid obesity, hyperlipidemia, hypertension, diabetes mellitus type 2, GERD who was asked to come to the ER with chief complaint of diarrhea.  History of present illness dates back to 2 weeks ago when patient started having diarrhea.  Patient also gives a history of nausea and vomiting. Patient also gives a history of fall. Patient went to see his primary care yesterday and was requested to come to the ER. Upon evaluation in the ER patient was found to have serum sodium 123, BUN of 104 and serum creatinine of 8.  Patient was admitted for further care.   Nephrology was consulted for acute kidney injury.Patient was seen today in the ER Patient is well-known to our practice as patient does see Dr. Candiss Norse now, in the past patient has been followed by Dr. Juleen China.   Patient seen sitting on side of bed, completed breakfast tray at bedside. Appears agitated, states he is not dumb and "we" have made him worse since he's been here. Becomes argumentative when reviewing today's plan, states "if I go home, I can fix my own kidneys."  Per nursing, patient tampered with IV overnight and increased Furosemide drip rate.    Patient was seen today on second floor Patient was thereafter seen on dialysis Patient main concern was that he would try to dialysis that he will go home on Sunday   Medications   aspirin  81 mg Oral Daily   atorvastatin  20 mg Oral Daily   heparin  5,000 Units Subcutaneous Q8H   insulin aspart  0-5 Units Subcutaneous QHS   insulin aspart  0-9 Units Subcutaneous TID  WC   lidocaine  1 patch Transdermal Q24H   liver oil-zinc oxide   Topical TID   methocarbamol  500 mg Oral TID   nystatin   Topical TID   pantoprazole  80 mg Oral BID         [id number] from the symptoms mentioned above,there are no other symptoms referable to all systems reviewed.  Physical Exam: Vital signs in last 24 hours: Temp:  [97.4 F (36.3 C)-98.1 F (36.7 C)] 97.4 F (36.3 C) (10/20 0812) Pulse Rate:  [83-96] 96 (10/20 0812) Resp:  [17-19] 18 (10/20 0812) BP: (126-151)/(78-106) 151/106 (10/20 0812) SpO2:  [93 %-97 %] 93 % (10/20 0812) Weight change:  Last BM Date : 11/14/21  Intake/Output from previous day: 10/19 0701 - 10/20 0700 In: 1243.3 [P.O.:1240; I.V.:3.3] Out: 1 [Stool:1] Total I/O In: 240 [P.O.:240] Out: -    Physical Exam:  General- pt is awake,alert, oriented to time place and person  HEENT-head is atraumatic normocephalic, sclera is muddy  Resp- No acute REsp distress, Fine crackles  CVS- S1S2 regular in rate and rhythm  GIT- BS+, soft, Non tender , Non distended, Morbidly obese  EXT- Chronic venous stasis changes,  No Cyanosis          2 + edema   Addendum Access Right femoral temporary catheter in situ  Lab Results:  CBC  Recent Labs    11/13/21 0829 11/14/21 0440  WBC 4.9  4.7  HGB 10.9* 9.9*  HCT 37.1* 33.7*  PLT 100* 103*     BMET  Recent Labs    11/13/21 0829 11/14/21 0440  NA 135 135  K 3.9 3.7  CL 96* 96*  CO2 29 29  GLUCOSE 112* 118*  BUN 124* 128*  CREATININE 8.66* 8.37*  CALCIUM 8.5* 8.2*       Most recent Creatinine trend  Lab Results  Component Value Date   CREATININE 8.37 (H) 11/14/2021   CREATININE 8.66 (H) 11/13/2021   CREATININE 8.26 (H) 11/12/2021      MICRO   Recent Results (from the past 240 hour(s))  Gastrointestinal Panel by PCR , Stool     Status: None   Collection Time: 11/10/21 11:03 PM   Specimen: Stool  Result Value Ref Range Status   Campylobacter species NOT  DETECTED NOT DETECTED Final   Plesimonas shigelloides NOT DETECTED NOT DETECTED Final   Salmonella species NOT DETECTED NOT DETECTED Final   Yersinia enterocolitica NOT DETECTED NOT DETECTED Final   Vibrio species NOT DETECTED NOT DETECTED Final   Vibrio cholerae NOT DETECTED NOT DETECTED Final   Enteroaggregative E coli (EAEC) NOT DETECTED NOT DETECTED Final   Enteropathogenic E coli (EPEC) NOT DETECTED NOT DETECTED Final   Enterotoxigenic E coli (ETEC) NOT DETECTED NOT DETECTED Final   Shiga like toxin producing E coli (STEC) NOT DETECTED NOT DETECTED Final   Shigella/Enteroinvasive E coli (EIEC) NOT DETECTED NOT DETECTED Final   Cryptosporidium NOT DETECTED NOT DETECTED Final   Cyclospora cayetanensis NOT DETECTED NOT DETECTED Final   Entamoeba histolytica NOT DETECTED NOT DETECTED Final   Giardia lamblia NOT DETECTED NOT DETECTED Final   Adenovirus F40/41 NOT DETECTED NOT DETECTED Final   Astrovirus NOT DETECTED NOT DETECTED Final   Norovirus GI/GII NOT DETECTED NOT DETECTED Final   Rotavirus A NOT DETECTED NOT DETECTED Final   Sapovirus (I, II, IV, and V) NOT DETECTED NOT DETECTED Final    Comment: Performed at [facility], [address] [name], [place] [address]  C Difficile Quick Screen w PCR reflex     Status: None   Collection Time: 11/10/21 11:03 PM   Specimen: Stool  Result Value Ref Range Status   C Diff antigen NEGATIVE NEGATIVE Final   C Diff toxin NEGATIVE NEGATIVE Final   C Diff interpretation No C. difficile detected.  Final    Comment: Performed at [facility], [address] [place], [place] [address]  SARS Coronavirus 2 by RT PCR (hospital order, performed in [facility] hospital lab) *cepheid single result test* Anterior Nasal Swab     Status: None   Collection Time: 11/10/21 11:03 PM   Specimen: Anterior Nasal Swab  Result Value Ref Range Status   SARS Coronavirus 2 by RT PCR NEGATIVE NEGATIVE Final    Comment: (NOTE) SARS-CoV-2  target nucleic acids are NOT DETECTED.  The SARS-CoV-2 RNA is generally detectable in upper and lower respiratory specimens during the acute phase of infection. The lowest concentration of SARS-CoV-2 viral copies this assay can detect is 250 copies / mL. A negative result does not preclude SARS-CoV-2 infection and should not be used as the sole basis for treatment or other patient management decisions.  A negative result may occur with improper specimen collection / handling, submission of specimen other than nasopharyngeal swab, presence of viral mutation(s) within the areas targeted by this assay, and inadequate number of viral copies (<250 copies / mL). A negative result must  be combined with clinical observations, patient history, and epidemiological information.  Fact Sheet for Patients:   https://www.patel.info/  Fact Sheet for Healthcare Providers: https://hall.com/  This test is not yet approved or  cleared by the [place] FDA and has been authorized for detection and/or diagnosis of SARS-CoV-2 by FDA under an Emergency Use Authorization (EUA).  This EUA will remain in effect (meaning this test can be used) for the duration of the COVID-19 declaration under Section 564(b)(1) of the Act, 21 U.S.C. section 360bbb-3(b)(1), unless the authorization is terminated or revoked sooner.  Performed at [facility], [address] [place], [place] [address]     Renal ultrasound done on November 11, 2021 was reviewed  Right Kidney:   Renal measurements: 10.9 x 6.0 x 5.2 cm = volume: 177 mL. Echogenicity within normal limits. No mass or hydronephrosis visualized.   Left Kidney:   Renal measurements: 10.4 x 7.0 x 6.1 cm = volume: 233 mL. Echogenicity within normal limits. No mass or hydronephrosis visualized.   Bladder:   Appears normal for degree of bladder distention.   Other:   Incidental note of sludge and  stones within the gallbladder.   IMPRESSION: Unremarkable ultrasound of the kidneys.   Cholelithiasis.  Chest x-ray done on November 10, 2021 was reviewed FINDINGS: Cardiomegaly. Pulmonary vascular congestion. Diffuse bilateral interstitial prominence. No large pleural fluid collection. No pneumothorax.   IMPRESSION: Cardiomegaly with pulmonary vascular congestion and interstitial edema.   Impression:   1)Renal    Patient has acute kidney injury Patient has AKI secondary to ATN AKI with some component of prerenal issues as patient was having decreased oral intake Patient renal ultrasound does not show any  obstruction Patient has AKI on CKD Patient has baseline CKD stage IV Patient has CKD stage IV secondary diabetes mellitus Patient CKD has been marked with multiple episodes of AKI    Creatinine trend 2023 2.1--2.3 and now at 8.2 2022 3.0--5.0-AKI 2021 2.5--4.2-AKI 2020 2019 2.0 2018 2.7--3.7 2017 1.6--2.1 2016 1.3--1.6 3.0  Renal function remains decreased. Uremic symptoms present yesterday, drowsy and confusion. 2)HTN with chronic kidney disease  Blood pressure stable, 151/106  3)Anemia of chronic disease     Latest Ref Rng & Units 11/14/2021    4:40 AM 11/13/2021    8:29 AM 11/12/2021    4:41 AM  CBC  WBC 4.0 - 10.5 K/uL 4.7  4.9  4.8   Hemoglobin 13.0 - 17.0 g/dL 9.9  10.9  10.7   Hematocrit 39.0 - 52.0 % 33.7  37.1  35.2   Platelets 150 - 400 K/uL 103  100  102      Hgb within desired range   4) Secondary hyperparathyroidism -CKD Mineral-Bone Disorder    Lab Results  Component Value Date   CALCIUM 8.2 (L) 11/14/2021    Secondary Hyperparathyroidism  present . Patient has history of secondary hyperparathyroidism Patient intact PTH has been elevated going back to 2020  Will continue to monitor bone minerals   5)Diabetes mellitus type 2 with chronic kidney disease Being followed by the primary team  6) Electrolytes      Latest  Ref Rng & Units 11/14/2021    4:40 AM 11/13/2021    8:29 AM 11/12/2021    4:41 AM  BMP  Glucose 70 - 99 mg/dL 118  112  102   BUN 6 - 20 mg/dL 128  124  115   Creatinine 0.61 - 1.24 mg/dL 8.37  8.66  8.26  Sodium 135 - 145 mmol/L 135  135  131   Potassium 3.5 - 5.1 mmol/L 3.7  3.9  3.9   Chloride 98 - 111 mmol/L 96  96  92   CO2 22 - 32 mmol/L 29  29  30    Calcium 8.9 - 10.3 mg/dL 8.2  8.5  8.1      Sodium Hyponatremia Resolved   Potassium Normokalemic    7)Acid base  Co2 at goal  8) Right-sided heart failure/Fluid overload  Patient 2D echo done and 2020 had shown  Right Ventricle: The right ventricle has mildly reduced systolic function.  The cavity in severely enlarged in size. There is no increase in right  ventricular wall thickness. Right ventricular systolic pressure could not  be assessed.  Remains on furosemide drip 8mg /hr  Plan:   Patient agreeable to HD temp cath yesterday but voices concerns today. After discussing concerns, patient agreeable to proceed within his stipulations. Discussed with patient that dialysis would be performed today and tomorrow. Patient repeats that he would like to discharge this weekend. Also discussed with patient that he may require an outpatient clinic to continue dialysis, which would need to be arranged prior to discharge. He continues to demand discharge this weekend stating he can return to hospital for further dialysis. Patient informed that inpatient dialysis is not for routine treatments unless admitted. Will continue to follow. Renal navigator aware of patient and monitoring situation.   Remains on furosemide drip until dialysis initiation.    Flowella Breeze 11/14/2021, 11:50 AM   Patient was seen on dialysis unit Patient tolerating treatment well I saw and evaluated the patient with Colon Flattery, NP.  I personally formulated the plan of care.  I agree with the findings and plan as documented in the note except  as noted .

## 2021-11-15 LAB — HEPATITIS B SURFACE ANTIBODY, QUANTITATIVE: Hep B S AB Quant (Post): <3.1 m[IU]/mL — ABNORMAL LOW (ref >9.9)

## 2021-11-15 NOTE — Discharge Summary (Signed)
AMA Discharge Summary  56 year old male morbid obesity, hyperlipidemia, hypertension, insulin-dependent diabetes mellitus, GERD, who presents emergency department for chief concerns of abnormal labs.   Patient has been on gentle intravenous fluids given volume overloaded state.  This does not result in an improvement in serum creatinine.  Discussed with nephrology on 10/18.  Patient is adamantly refusing renal replacement therapy.  Requesting his diuretics.  We will attempt to treat as cardiorenal syndrome and restart home diuretics.  Monitor volume status carefully.   10/20: After much conversation the patient has agreed to placement of temporary dialysis catheter and initiation of HD.  Plan for vascular procedure today.  Plan for HD today and tomorrow.  Patient remains reluctant to adhere to medical therapy.  Apparently he dialed 911 twice during the course of the night.  10/20: Patient successfully completed hemodialysis.  Upon return to the floor he immediately told the bedside RN that he would like to leave.  Explained to the nurse that the patient was not appropriate for discharge at this time.  I explained this at length with patient earlier.  Myself and other providers have reiterated the fact that in order for him to be discharged safely with dialysis in place he would need a tunneled dialysis catheter as well as establishment of outpatient HD chair.  Despite significant warnings from the bedside RN and charge RN the patient elected to leave [facility].  Myself, nephrology, patient, patient's mother had had extensive conversations throughout the course of admission regarding the severity of his renal dysfunction, the need for initiation of hemodialysis, the potential consequences for untreated renal failure.  Despite warnings the patient elected to leave.  He did sign AMA paperwork.  Temporary dialysis catheter was removed by bedside RN prior to patient leaving.  The patient is very  high risk for clinical deterioration  Ralene Muskrat MD   No charge

## 2021-11-17 ENCOUNTER — Encounter: Payer: Self-pay | Admitting: Vascular Surgery

## 2021-11-17 LAB — HEPATITIS B E ANTIBODY: Hep B E Ab: NEGATIVE

## 2021-11-17 LAB — HEPATITIS B SURFACE ANTIGEN

## 2021-11-17 LAB — HEPATITIS B CORE ANTIBODY, TOTAL

## 2021-11-18 ENCOUNTER — Ambulatory Visit (INDEPENDENT_AMBULATORY_CARE_PROVIDER_SITE_OTHER): Payer: PPO | Admitting: Family Medicine

## 2021-11-18 ENCOUNTER — Encounter: Payer: Self-pay | Admitting: Family Medicine

## 2021-11-18 VITALS — BP 148/88 | HR 80 | Ht 71.0 in | Wt 388.0 lb

## 2021-11-18 DIAGNOSIS — I5022 Chronic systolic (congestive) heart failure: Secondary | ICD-10-CM

## 2021-11-18 DIAGNOSIS — N184 Chronic kidney disease, stage 4 (severe): Secondary | ICD-10-CM

## 2021-11-18 DIAGNOSIS — N179 Acute kidney failure, unspecified: Secondary | ICD-10-CM | POA: Diagnosis not present

## 2021-11-18 DIAGNOSIS — I129 Hypertensive chronic kidney disease with stage 1 through stage 4 chronic kidney disease, or unspecified chronic kidney disease: Secondary | ICD-10-CM

## 2021-11-18 NOTE — Patient Instructions (Addendum)
Thank you for coming to the office today.  Blood today for kidney and blood counts  Keep apt tomorrow for [facility]  Please schedule a Follow-up Appointment to: Return if symptoms worsen or fail to improve.  If you have any other questions or concerns, please feel free to call the office or send a message through [place]. You may also schedule an earlier appointment if necessary.  Additionally, you may be receiving a survey about your experience at our office within a few days to 1 week by e-mail or mail. We value your feedback.  [name], DO [place]

## 2021-11-18 NOTE — Progress Notes (Signed)
Subjective:    Patient ID: [name], male    DOB: April 20, 1965, 56 y.o.   MRN: [id number]  [name] is a 56 y.o. male presenting on 11/18/2021 for Diabetes and Hospitalization Follow-up   HPI  HOSPITAL FOLLOW-UP VISIT  Hospital/Location: [facility] Date of Admission: 11/10/21 Date of Discharge: 11/15/21 Transitions of care telephone call: Not completed  Reason for Admission: Dehydration Diarrhea / Renal Failure  - Hospital H&P and Discharge Summary have been reviewed - Patient presents today 3 days after recent hospitalization. Brief summary of recent course, patient had symptoms of diarrhea 2 weeks, office visit and worsening concern for renal failure, sent via EMS, dx with AoCKD with worsening kidney function renal failure, treated in hospital, had temporary hemodialysis access 1 treatment then he signed out AMA.  Here today 3 days later for follow-up   Has upcoming apt at [facility] for diuresis tomorrow.  Due for HFU labs  - Today reports overall has done well after discharge. Symptoms of dehydration have improved, but admits some volume elevation. Still elevated BP  Needs dressing change at hemodialysis catheter site   I have reviewed the discharge medication list, and have reconciled the current and discharge medications today.   Current Outpatient Medications:    albuterol (PROVENTIL) (2.5 MG/3ML) 0.083% nebulizer solution, Take 3 mLs (2.5 mg total) by nebulization every 6 (six) hours as needed for wheezing or shortness of breath., Disp: 150 mL, Rfl: 3   albuterol (VENTOLIN HFA) 108 (90 Base) MCG/ACT inhaler, Inhale 2 puffs into the lungs every 4 (four) hours as needed for wheezing or shortness of breath., Disp: 18 g, Rfl: 3   allopurinol (ZYLOPRIM) 100 MG tablet, Take 2 tablets (200 mg total) by mouth daily., Disp: 180 tablet, Rfl: 3   aspirin 81 MG chewable tablet, Chew 81 mg by mouth daily., Disp: , Rfl:    atorvastatin (LIPITOR) 20 MG tablet,  Take 1 tablet (20 mg total) by mouth daily., Disp: 90 tablet, Rfl: 3   blood glucose meter kit and supplies KIT, Brand of choice; LON 99 months; check FSBS 3x a day; E11.65; disp one meter with 100 strips + 1 refill of strips, Disp: 1 each, Rfl: 0   Blood Glucose Monitoring Suppl (ONE TOUCH ULTRA 2) w/Device KIT, Use to check blood glucose up to 3 x daily, Disp: 1 kit, Rfl: 0   carvedilol (COREG) 25 MG tablet, Take 2 tablets (50 mg total) by mouth 2 (two) times daily with a meal., Disp: 360 tablet, Rfl: 3   Dulaglutide (TRULICITY) 9.03 ES/9.2ZR SOPN, Inject 0.75 mg into the skin once a week., Disp: , Rfl:    esomeprazole (NEXIUM) 40 MG capsule, Take 1 capsule (40 mg total) by mouth 2 (two) times daily before a meal., Disp: 180 capsule, Rfl: 0   fluticasone (FLONASE) 50 MCG/ACT nasal spray, Place 2 sprays into both nostrils daily. Use for 4-6 weeks then stop and use seasonally or as needed., Disp: 16 g, Rfl: 3   Insulin Glargine (BASAGLAR KWIKPEN) 100 UNIT/ML SOPN, Inject 10 Units into the skin daily. Submitted to [employer] for assistance, Disp: , Rfl:    Insulin Pen Needle (B-D UF III MINI PEN NEEDLES) 31G X 5 MM MISC, 1 each by Does not apply route daily. Use once daily with Victoza., Disp: 100 each, Rfl: 12   loratadine (CLARITIN) 10 MG tablet, Take 1 tablet (10 mg total) by mouth daily. (Patient taking differently: Take 10 mg by mouth every  other day. As needed), Disp: 90 tablet, Rfl: 3   metolazone (ZAROXOLYN) 5 MG tablet, Take 1 tablet (5 mg total) by mouth 1 (one) time each day if needed (for weight gain), Disp: , Rfl:    montelukast (SINGULAIR) 10 MG tablet, Take 1 tablet (10 mg total) by mouth at bedtime., Disp: 90 tablet, Rfl: 3   OneTouch Delica Lancets 76B MISC, Use to check blood sugar 3 x daily, Disp: 300 each, Rfl: 3   ONETOUCH ULTRA test strip, Check blood sugar 3 x daily, Disp: 300 each, Rfl: 3   potassium chloride (KLOR-CON) 10 MEQ tablet, Take 10 mEq by mouth daily., Disp: , Rfl:     simethicone (MYLICON) 341 MG chewable tablet, Chew 125 mg by mouth every 6 (six) hours as needed for flatulence., Disp: , Rfl:    traMADol (ULTRAM) 50 MG tablet, Take 1-2 tablets (50-100 mg total) by mouth every 12 (twelve) hours as needed., Disp: 45 tablet, Rfl: 2   vitamin C (ASCORBIC ACID) 500 MG tablet, Take 1,000 mg by mouth daily., Disp: , Rfl:    bumetanide (BUMEX) 1 MG tablet, Take by mouth. Take 2 tablets (2 mg total) by mouth in the morning and 2 tablets (2 mg total) at noon and 2 tablets (2 mg total) in the evening., Disp: , Rfl:    calcitRIOL (ROCALTROL) 0.5 MCG capsule, Take 2 capsules (1 mcg total) by mouth 1 (one) time each day (Patient not taking: Reported on 11/11/2021), Disp: , Rfl:   ------------------------------------------------------------------------- Social History   Tobacco Use   Smoking status: Never   Smokeless tobacco: Never  Substance Use Topics   Alcohol use: Yes    Comment: a glass of wine and a couple beers a day   Drug use: No    Review of Systems Per HPI unless specifically indicated above     Objective:    BP (!) 159/87   Pulse 80   Ht 5' 11" (1.803 m)   Wt (!) 388 lb (176 kg)   SpO2 100%   BMI 54.12 kg/m   Wt Readings from Last 3 Encounters:  11/18/21 (!) 388 lb (176 kg)  11/14/21 (!) 388 lb 0.2 oz (176 kg)  11/10/21 (!) 339 lb (153.8 kg)    Physical Exam Vitals and nursing note reviewed.  Constitutional:      General: He is not in acute distress.    Appearance: He is well-developed. He is obese. He is not diaphoretic.     Comments: Chronically ill,some discomfort with groin bandage, cooperative  HENT:     Head: Normocephalic and atraumatic.  Eyes:     General:        Right eye: No discharge.        Left eye: No discharge.     Conjunctiva/sclera: Conjunctivae normal.  Neck:     Thyroid: No thyromegaly.  Cardiovascular:     Rate and Rhythm: Normal rate and regular rhythm.     Pulses: Normal pulses.     Heart sounds: Normal  heart sounds. No murmur heard. Pulmonary:     Effort: Pulmonary effort is normal. No respiratory distress.     Breath sounds: Normal breath sounds. No wheezing or rales.  Musculoskeletal:        General: Normal range of motion.     Cervical back: Normal range of motion and neck supple.  Lymphadenopathy:     Cervical: No cervical adenopathy.  Skin:    General: Skin is warm and dry.  Findings: No erythema or rash.     Comments: R groin under skin folds area of hemodialysis temporary catheter access, has some dried blood on surface scabs, no active bleeding, dressing to be changed to non adherent dressing with ointment and medical tape  Neurological:     Mental Status: He is alert and oriented to person, place, and time. Mental status is at baseline.     Comments: Sleepy  Psychiatric:        Behavior: Behavior normal.     Comments: Well groomed, good eye contact, normal speech and thoughts      Results for orders placed or performed during the hospital encounter of 11/10/21  Gastrointestinal Panel by PCR , Stool   Specimen: Stool  Result Value Ref Range   Campylobacter species NOT DETECTED NOT DETECTED   Plesimonas shigelloides NOT DETECTED NOT DETECTED   Salmonella species NOT DETECTED NOT DETECTED   Yersinia enterocolitica NOT DETECTED NOT DETECTED   Vibrio species NOT DETECTED NOT DETECTED   Vibrio cholerae NOT DETECTED NOT DETECTED   Enteroaggregative E coli (EAEC) NOT DETECTED NOT DETECTED   Enteropathogenic E coli (EPEC) NOT DETECTED NOT DETECTED   Enterotoxigenic E coli (ETEC) NOT DETECTED NOT DETECTED   Shiga like toxin producing E coli (STEC) NOT DETECTED NOT DETECTED   Shigella/Enteroinvasive E coli (EIEC) NOT DETECTED NOT DETECTED   Cryptosporidium NOT DETECTED NOT DETECTED   Cyclospora cayetanensis NOT DETECTED NOT DETECTED   Entamoeba histolytica NOT DETECTED NOT DETECTED   Giardia lamblia NOT DETECTED NOT DETECTED   Adenovirus F40/41 NOT DETECTED NOT DETECTED    Astrovirus NOT DETECTED NOT DETECTED   Norovirus GI/GII NOT DETECTED NOT DETECTED   Rotavirus A NOT DETECTED NOT DETECTED   Sapovirus (I, II, IV, and V) NOT DETECTED NOT DETECTED  C Difficile Quick Screen w PCR reflex   Specimen: Stool  Result Value Ref Range   C Diff antigen NEGATIVE NEGATIVE   C Diff toxin NEGATIVE NEGATIVE   C Diff interpretation No C. difficile detected.   SARS Coronavirus 2 by RT PCR (hospital order, performed in [facility] hospital lab) *cepheid single result test* Anterior Nasal Swab   Specimen: Anterior Nasal Swab  Result Value Ref Range   SARS Coronavirus 2 by RT PCR NEGATIVE NEGATIVE  Lipase, blood  Result Value Ref Range   Lipase 97 (H) 11 - 51 U/L  Comprehensive metabolic panel  Result Value Ref Range   Sodium 123 (L) 135 - 145 mmol/L   Potassium 3.7 3.5 - 5.1 mmol/L   Chloride 83 (L) 98 - 111 mmol/L   CO2 29 22 - 32 mmol/L   Glucose, Bld 77 70 - 99 mg/dL   BUN 104 (H) 6 - 20 mg/dL   Creatinine, Ser 8.08 (H) 0.61 - 1.24 mg/dL   Calcium 7.9 (L) 8.9 - 10.3 mg/dL   Total Protein 5.8 (L) 6.5 - 8.1 g/dL   Albumin 2.1 (L) 3.5 - 5.0 g/dL   AST 61 (H) 15 - 41 U/L   ALT 55 (H) 0 - 44 U/L   Alkaline Phosphatase 468 (H) 38 - 126 U/L   Total Bilirubin 3.4 (H) 0.3 - 1.2 mg/dL   GFR, Estimated 7 (L) >60 mL/min   Anion gap 11 5 - 15  CBC  Result Value Ref Range   WBC 4.4 4.0 - 10.5 K/uL   RBC 3.62 (L) 4.22 - 5.81 MIL/uL   Hemoglobin 11.1 (L) 13.0 - 17.0 g/dL   HCT 36.1 (L)  39.0 - 52.0 %   MCV 99.7 80.0 - 100.0 fL   MCH 30.7 26.0 - 34.0 pg   MCHC 30.7 30.0 - 36.0 g/dL   RDW 23.9 (H) 11.5 - 15.5 %   Platelets 90 (L) 150 - 400 K/uL   nRBC 1.4 (H) 0.0 - 0.2 %  Urinalysis, Routine w reflex microscopic Urine, Clean Catch  Result Value Ref Range   Color, Urine YELLOW (A) YELLOW   APPearance CLEAR (A) CLEAR   Specific Gravity, Urine 1.010 1.005 - 1.030   pH 5.0 5.0 - 8.0   Glucose, UA NEGATIVE NEGATIVE mg/dL   Hgb urine dipstick NEGATIVE NEGATIVE    Bilirubin Urine NEGATIVE NEGATIVE   Ketones, ur NEGATIVE NEGATIVE mg/dL   Protein, ur NEGATIVE NEGATIVE mg/dL   Nitrite NEGATIVE NEGATIVE   Leukocytes,Ua NEGATIVE NEGATIVE  Ethanol  Result Value Ref Range   Alcohol, Ethyl (B) <10 <10 mg/dL  Urine Drug Screen, Qualitative  Result Value Ref Range   Tricyclic, Ur Screen NONE DETECTED NONE DETECTED   Amphetamines, Ur Screen NONE DETECTED NONE DETECTED   MDMA (Ecstasy)Ur Screen NONE DETECTED NONE DETECTED   Cocaine Metabolite,Ur [place] NONE DETECTED NONE DETECTED   Opiate, Ur Screen NONE DETECTED NONE DETECTED   Phencyclidine (PCP) Ur S NONE DETECTED NONE DETECTED   Cannabinoid 50 Ng, Ur [place] NONE DETECTED NONE DETECTED   Barbiturates, Ur Screen NONE DETECTED NONE DETECTED   Benzodiazepine, Ur Scrn NONE DETECTED NONE DETECTED   Methadone Scn, Ur NONE DETECTED NONE DETECTED  CK  Result Value Ref Range   Total CK 369 49 - 397 U/L  Brain natriuretic peptide  Result Value Ref Range   B Natriuretic Peptide 1,223.0 (H) 0.0 - 100.0 pg/mL  HIV Antibody (routine testing w rflx)  Result Value Ref Range   HIV Screen 4th Generation wRfx Non Reactive Non Reactive  Basic metabolic panel  Result Value Ref Range   Sodium 125 (L) 135 - 145 mmol/L   Potassium 3.7 3.5 - 5.1 mmol/L   Chloride 83 (L) 98 - 111 mmol/L   CO2 30 22 - 32 mmol/L   Glucose, Bld 102 (H) 70 - 99 mg/dL   BUN 101 (H) 6 - 20 mg/dL   Creatinine, Ser 8.22 (H) 0.61 - 1.24 mg/dL   Calcium 8.0 (L) 8.9 - 10.3 mg/dL   GFR, Estimated 7 (L) >60 mL/min   Anion gap 12 5 - 15  CBC  Result Value Ref Range   WBC 4.4 4.0 - 10.5 K/uL   RBC 3.62 (L) 4.22 - 5.81 MIL/uL   Hemoglobin 10.9 (L) 13.0 - 17.0 g/dL   HCT 36.8 (L) 39.0 - 52.0 %   MCV 101.7 (H) 80.0 - 100.0 fL   MCH 30.1 26.0 - 34.0 pg   MCHC 29.6 (L) 30.0 - 36.0 g/dL   RDW 24.4 (H) 11.5 - 15.5 %   Platelets 90 (L) 150 - 400 K/uL   nRBC 1.1 (H) 0.0 - 0.2 %  Ammonia  Result Value Ref Range   Ammonia 52 (H) 9 - 35 umol/L  CBC   Result Value Ref Range   WBC 4.5 4.0 - 10.5 K/uL   RBC 3.55 (L) 4.22 - 5.81 MIL/uL   Hemoglobin 10.7 (L) 13.0 - 17.0 g/dL   HCT 36.2 (L) 39.0 - 52.0 %   MCV 102.0 (H) 80.0 - 100.0 fL   MCH 30.1 26.0 - 34.0 pg   MCHC 29.6 (L) 30.0 - 36.0 g/dL   RDW  24.5 (H) 11.5 - 15.5 %   Platelets 96 (L) 150 - 400 K/uL   nRBC 1.1 (H) 0.0 - 0.2 %  Basic metabolic panel  Result Value Ref Range   Sodium 127 (L) 135 - 145 mmol/L   Potassium 3.7 3.5 - 5.1 mmol/L   Chloride 87 (L) 98 - 111 mmol/L   CO2 29 22 - 32 mmol/L   Glucose, Bld 146 (H) 70 - 99 mg/dL   BUN 112 (H) 6 - 20 mg/dL   Creatinine, Ser 8.15 (H) 0.61 - 1.24 mg/dL   Calcium 8.1 (L) 8.9 - 10.3 mg/dL   GFR, Estimated 7 (L) >60 mL/min   Anion gap 11 5 - 15  Hepatitis C antibody  Result Value Ref Range   HCV Ab Non Reactive (A) Non Reactive  CBC  Result Value Ref Range   WBC 4.8 4.0 - 10.5 K/uL   RBC 3.40 (L) 4.22 - 5.81 MIL/uL   Hemoglobin 10.7 (L) 13.0 - 17.0 g/dL   HCT 35.2 (L) 39.0 - 52.0 %   MCV 103.5 (H) 80.0 - 100.0 fL   MCH 31.5 26.0 - 34.0 pg   MCHC 30.4 30.0 - 36.0 g/dL   RDW 24.1 (H) 11.5 - 15.5 %   Platelets 102 (L) 150 - 400 K/uL   nRBC 1.7 (H) 0.0 - 0.2 %  Basic metabolic panel  Result Value Ref Range   Sodium 131 (L) 135 - 145 mmol/L   Potassium 3.9 3.5 - 5.1 mmol/L   Chloride 92 (L) 98 - 111 mmol/L   CO2 30 22 - 32 mmol/L   Glucose, Bld 102 (H) 70 - 99 mg/dL   BUN 115 (H) 6 - 20 mg/dL   Creatinine, Ser 8.26 (H) 0.61 - 1.24 mg/dL   Calcium 8.1 (L) 8.9 - 10.3 mg/dL   GFR, Estimated 7 (L) >60 mL/min   Anion gap 9 5 - 15  Glucose, capillary  Result Value Ref Range   Glucose-Capillary 90 70 - 99 mg/dL  Glucose, capillary  Result Value Ref Range   Glucose-Capillary 112 (H) 70 - 99 mg/dL  Basic metabolic panel  Result Value Ref Range   Sodium 135 135 - 145 mmol/L   Potassium 3.9 3.5 - 5.1 mmol/L   Chloride 96 (L) 98 - 111 mmol/L   CO2 29 22 - 32 mmol/L   Glucose, Bld 112 (H) 70 - 99 mg/dL   BUN 124 (H) 6 -  20 mg/dL   Creatinine, Ser 8.66 (H) 0.61 - 1.24 mg/dL   Calcium 8.5 (L) 8.9 - 10.3 mg/dL   GFR, Estimated 7 (L) >60 mL/min   Anion gap 10 5 - 15  CBC  Result Value Ref Range   WBC 4.9 4.0 - 10.5 K/uL   RBC 3.51 (L) 4.22 - 5.81 MIL/uL   Hemoglobin 10.9 (L) 13.0 - 17.0 g/dL   HCT 37.1 (L) 39.0 - 52.0 %   MCV 105.7 (H) 80.0 - 100.0 fL   MCH 31.1 26.0 - 34.0 pg   MCHC 29.4 (L) 30.0 - 36.0 g/dL   RDW 24.8 (H) 11.5 - 15.5 %   Platelets 100 (L) 150 - 400 K/uL   nRBC 1.0 (H) 0.0 - 0.2 %  Glucose, capillary  Result Value Ref Range   Glucose-Capillary 107 (H) 70 - 99 mg/dL  Glucose, capillary  Result Value Ref Range   Glucose-Capillary 118 (H) 70 - 99 mg/dL  Hepatitis B surface antigen  Result Value Ref Range  Hepatitis B Surface Ag See Scanned report in [place] [facility] (A) NON REACTIVE  Hepatitis B surface antibody,quantitative  Result Value Ref Range   Hep B S AB Quant (Post) <3.1 (L) Immunity>9.9 mIU/mL  Hepatitis B e antibody  Result Value Ref Range   Hep B E Ab Negative Negative  Hepatitis B core antibody, total  Result Value Ref Range   Hep B Core Total Ab See Scanned report in [place] (A) NON REACTIVE  Glucose, capillary  Result Value Ref Range   Glucose-Capillary 129 (H) 70 - 99 mg/dL  Glucose, capillary  Result Value Ref Range   Glucose-Capillary 157 (H) 70 - 99 mg/dL  Basic metabolic panel  Result Value Ref Range   Sodium 135 135 - 145 mmol/L   Potassium 3.7 3.5 - 5.1 mmol/L   Chloride 96 (L) 98 - 111 mmol/L   CO2 29 22 - 32 mmol/L   Glucose, Bld 118 (H) 70 - 99 mg/dL   BUN 128 (H) 6 - 20 mg/dL   Creatinine, Ser 8.37 (H) 0.61 - 1.24 mg/dL   Calcium 8.2 (L) 8.9 - 10.3 mg/dL   GFR, Estimated 7 (L) >60 mL/min   Anion gap 10 5 - 15  Magnesium  Result Value Ref Range   Magnesium 1.9 1.7 - 2.4 mg/dL  CBC  Result Value Ref Range   WBC 4.7 4.0 - 10.5 K/uL   RBC 3.20 (L) 4.22 - 5.81 MIL/uL   Hemoglobin 9.9 (L) 13.0 - 17.0 g/dL   HCT 33.7 (L) 39.0 - 52.0  %   MCV 105.3 (H) 80.0 - 100.0 fL   MCH 30.9 26.0 - 34.0 pg   MCHC 29.4 (L) 30.0 - 36.0 g/dL   RDW 24.3 (H) 11.5 - 15.5 %   Platelets 103 (L) 150 - 400 K/uL   nRBC 1.1 (H) 0.0 - 0.2 %  Glucose, capillary  Result Value Ref Range   Glucose-Capillary 102 (H) 70 - 99 mg/dL  Glucose, capillary  Result Value Ref Range   Glucose-Capillary 163 (H) 70 - 99 mg/dL  Glucose, capillary  Result Value Ref Range   Glucose-Capillary 141 (H) 70 - 99 mg/dL  CBG monitoring, ED  Result Value Ref Range   Glucose-Capillary 114 (H) 70 - 99 mg/dL  CBG monitoring, ED  Result Value Ref Range   Glucose-Capillary 145 (H) 70 - 99 mg/dL  CBG monitoring, ED  Result Value Ref Range   Glucose-Capillary 109 (H) 70 - 99 mg/dL  CBG monitoring, ED  Result Value Ref Range   Glucose-Capillary 98 70 - 99 mg/dL  CBG monitoring, ED  Result Value Ref Range   Glucose-Capillary 102 (H) 70 - 99 mg/dL  CBG monitoring, ED  Result Value Ref Range   Glucose-Capillary 115 (H) 70 - 99 mg/dL   Comment 1 Notify RN   CBG monitoring, ED  Result Value Ref Range   Glucose-Capillary 127 (H) 70 - 99 mg/dL  ECHOCARDIOGRAM COMPLETE  Result Value Ref Range   Weight 5,425.08 oz   Height 71 in   BP 111/73 mmHg   S' Lateral 4.50 cm   Area-P 1/2 4.80 cm2  Troponin I (High Sensitivity)  Result Value Ref Range   Troponin I (High Sensitivity) 36 (H) <18 ng/L  Troponin I (High Sensitivity)  Result Value Ref Range   Troponin I (High Sensitivity) 36 (H) <18 ng/L      Assessment & Plan:   Problem List Items Addressed This Visit  Benign hypertension with CKD (chronic kidney disease) stage IV (HCC)   Relevant Orders   CBC with Differential/Platelet   COMPLETE METABOLIC PANEL WITH GFR   Chronic systolic heart failure ([place])   Other Visit Diagnoses     Acute renal failure superimposed on stage 4 chronic kidney disease, unspecified acute renal failure type ([place])    -  Primary   Relevant Orders   CBC with Differential/Platelet    COMPLETE METABOLIC PANEL WITH GFR       HFU HTN CKD IV  progression to renal failure Systolic CHF, cardiorenal Reviewed documentation from hospital, very concerning and discussion today with patient that I am very worried for his health if these issues are not addressed. The concern is for progression of kidney failure, and if he does not follow up closely or pursue nephrology management may warrant hemodialysis longer term if does not resolve  He agrees to labs today for repeat creatinine chemistry and CBC  He does have [facility] apt tomorrow for diuresis evaluation / management, they should be checking labs as well and following. In the past when his fluid has been treated that has improved his kidney function.  I understand his approach but at the same time, expressed my concern that he left AMA from hospital and am concerned he may need to return if progression of kidney dysfunction.  Temporary access site hemodialysis wound was re-dressed today.   No orders of the defined types were placed in this encounter.   Follow up plan: Return if symptoms worsen or fail to improve.   [name], [facility] 11/18/2021, 9:14 AM

## 2021-11-19 ENCOUNTER — Ambulatory Visit: Payer: Self-pay

## 2021-11-19 DIAGNOSIS — N185 Chronic kidney disease, stage 5: Secondary | ICD-10-CM | POA: Diagnosis not present

## 2021-11-19 DIAGNOSIS — R944 Abnormal results of kidney function studies: Secondary | ICD-10-CM | POA: Diagnosis not present

## 2021-11-19 DIAGNOSIS — Z6841 Body Mass Index (BMI) 40.0 and over, adult: Secondary | ICD-10-CM | POA: Diagnosis not present

## 2021-11-19 DIAGNOSIS — I5032 Chronic diastolic (congestive) heart failure: Secondary | ICD-10-CM | POA: Diagnosis not present

## 2021-11-19 LAB — CBC WITH DIFFERENTIAL/PLATELET
Absolute Monocytes: 298 cells/uL (ref 200–950)
Basophils Absolute: 50 cells/uL (ref 0–200)
Basophils Relative: 1.2 %
Eosinophils Absolute: 0 cells/uL — ABNORMAL LOW (ref 15–500)
Eosinophils Relative: 0 %
HCT: 29 % — ABNORMAL LOW (ref 38.5–50.0)
Hemoglobin: 9.3 g/dL — ABNORMAL LOW (ref 13.2–17.1)
Lymphs Abs: 1008 cells/uL (ref 850–3900)
MCH: 32.6 pg (ref 27.0–33.0)
MCHC: 32.1 g/dL (ref 32.0–36.0)
MCV: 101.8 fL — ABNORMAL HIGH (ref 80.0–100.0)
MPV: 10.5 fL (ref 7.5–12.5)
Monocytes Relative: 7.1 %
Neutro Abs: 2843 cells/uL (ref 1500–7800)
Neutrophils Relative %: 67.7 %
Platelets: 108 10*3/uL — ABNORMAL LOW (ref 140–400)
RBC: 2.85 10*6/uL — ABNORMAL LOW (ref 4.20–5.80)
RDW: 22.8 % — ABNORMAL HIGH (ref 11.0–15.0)
Total Lymphocyte: 24 %
WBC: 4.2 10*3/uL (ref 3.8–10.8)

## 2021-11-19 LAB — COMPLETE METABOLIC PANEL WITH GFR
AG Ratio: 0.8 (calc) — ABNORMAL LOW (ref 1.0–2.5)
ALT: 32 U/L (ref 9–46)
AST: 26 U/L (ref 10–35)
Albumin: 2.7 g/dL — ABNORMAL LOW (ref 3.6–5.1)
Alkaline phosphatase (APISO): 235 U/L — ABNORMAL HIGH (ref 35–144)
BUN/Creatinine Ratio: 15 (calc) (ref 6–22)
BUN: 100 mg/dL — ABNORMAL HIGH (ref 7–25)
CO2: 35 mmol/L — ABNORMAL HIGH (ref 20–32)
Calcium: 8.6 mg/dL (ref 8.6–10.3)
Chloride: 102 mmol/L (ref 98–110)
Creat: 6.47 mg/dL — ABNORMAL HIGH (ref 0.70–1.30)
Globulin: 3.3 g/dL (calc) (ref 1.9–3.7)
Glucose, Bld: 66 mg/dL (ref 65–99)
Potassium: 3.9 mmol/L (ref 3.5–5.3)
Sodium: 142 mmol/L (ref 135–146)
Total Bilirubin: 1.8 mg/dL — ABNORMAL HIGH (ref 0.2–1.2)
Total Protein: 6 g/dL — ABNORMAL LOW (ref 6.1–8.1)
eGFR: 9 mL/min/{1.73_m2} — ABNORMAL LOW (ref >=60)

## 2021-11-19 LAB — CBC MORPHOLOGY

## 2021-11-19 NOTE — Telephone Encounter (Signed)
Unfortunately the swelling is not easy to fix.  He already has [facility] Heart Failure specialist managing his fluid medication.  The most important thing he needs now is for kidney function to improve.  His swelling is mostly due to reduced kidneys, heart failure, and low protein in blood.  I cannot fix these.  He needs to see the Kidney specialist on 11/27/21, next week Thursday.  Visit Details  Encounter Start Encounter End  11/27/2021  1:40 PM 11/27/2021  2:00 PM   Providers  Murlean Iba, MD Nephrology NPI: [id number] [address] [place] [id number]   Phone: [phone]  [name], [facility] 11/19/2021, 12:18 PM

## 2021-11-19 NOTE — Telephone Encounter (Signed)
  Chief Complaint: bilateral lower leg swelling Symptoms: pt stated at one pint there was weeping now pt stated the swelling has went down and no further weeping, pain with walking Frequency: did not say  Pertinent Negatives: Patient denies difficulty breathing or hand swelling Disposition: [] ED /[] Urgent Care (no appt availability in office) / [] Appointment(In office/virtual)/ []  [place] [facility]/ [] Home Care/ [] Refused Recommended Disposition /[] [place] [facility]/ []  Follow-up with PCP Additional Notes: pt stated he was trying to reach the bariatric clinic then later in call she stated he was wanting to discuss with wound clininc. Very difficult to hear pt and advised pt cannot hear him well. Pt was frustrated NT did not see what doctor he was talking about or the name of the doctor. Pt advised NT that "the office staff know." NT made an appt with Dr. Parks Ranger but pt wanted NT to cancel since was just seen in office yesterday. Reason for Disposition  [1] MODERATE leg swelling (e.g., swelling extends up to knees) AND [2] new-onset or worsening  Answer Assessment - Initial Assessment Questions 1. ONSET: "When did the swelling start?" (e.g., minutes, hours, days)     After a fall 2. LOCATION: "What part of the leg is swollen?"  "Are both legs swollen or just one leg?"     Both to knee 3. SEVERITY: "How bad is the swelling?" (e.g., localized; mild, moderate, severe)   - Localized: Small area of swelling localized to one leg.   - MILD pedal edema: Swelling limited to foot and ankle, pitting edema < 1/4 inch (6 mm) deep, rest and elevation eliminate most or all swelling.   - MODERATE edema: Swelling of lower leg to knee, pitting edema > 1/4 inch (6 mm) deep, rest and elevation only partially reduce swelling.   - SEVERE edema: Swelling extends above knee, facial or hand swelling present.      moderate 4. REDNESS: "Does the swelling look red or infected?"     no 5. PAIN: "Is the  swelling painful to touch?" If Yes, ask: "How painful is it?"   (Scale 1-10; mild, moderate or severe)     *No Answer* 6. FEVER: "Do you have a fever?" If Yes, ask: "What is it, how was it measured, and when did it start?"      *No Answer* 7. CAUSE: "What do you think is causing the leg swelling?"     *No Answer* 8. MEDICAL HISTORY: "Do you have a history of blood clots (e.g., DVT), cancer, heart failure, kidney disease, or liver failure?"     *No Answer* 9. RECURRENT SYMPTOM: "Have you had leg swelling before?" If Yes, ask: "When was the last time?" "What happened that time?"     *No Answer* 10. OTHER SYMPTOMS: "Do you have any other symptoms?" (e.g., chest pain, difficulty breathing)       no 11. PREGNANCY: "Is there any chance you are pregnant?" "When was your last menstrual period?"       *No Answer*  Protocols used: Leg Swelling and Edema-A-AH

## 2021-11-20 ENCOUNTER — Ambulatory Visit: Payer: PPO | Admitting: Family Medicine

## 2021-11-20 ENCOUNTER — Emergency Department
Admission: EM | Admit: 2021-11-20 | Discharge: 2021-11-20 | Discharge status: Against Medical Advice | Payer: PPO | Attending: Emergency Medicine | Admitting: Emergency Medicine

## 2021-11-20 ENCOUNTER — Other Ambulatory Visit: Payer: Self-pay

## 2021-11-20 DIAGNOSIS — S8992XA Unspecified injury of left lower leg, initial encounter: Secondary | ICD-10-CM | POA: Diagnosis present

## 2021-11-20 DIAGNOSIS — S301XXA Contusion of abdominal wall, initial encounter: Secondary | ICD-10-CM | POA: Diagnosis not present

## 2021-11-20 DIAGNOSIS — R55 Syncope and collapse: Secondary | ICD-10-CM | POA: Diagnosis not present

## 2021-11-20 DIAGNOSIS — Y92410 Unspecified street and highway as the place of occurrence of the external cause: Secondary | ICD-10-CM | POA: Diagnosis not present

## 2021-11-20 DIAGNOSIS — S0081XA Abrasion of other part of head, initial encounter: Secondary | ICD-10-CM | POA: Diagnosis not present

## 2021-11-20 DIAGNOSIS — S81812A Laceration without foreign body, left lower leg, initial encounter: Secondary | ICD-10-CM | POA: Diagnosis not present

## 2021-11-20 DIAGNOSIS — Z5329 Procedure and treatment not carried out because of patient's decision for other reasons: Secondary | ICD-10-CM | POA: Insufficient documentation

## 2021-11-20 DIAGNOSIS — R58 Hemorrhage, not elsewhere classified: Secondary | ICD-10-CM | POA: Diagnosis not present

## 2021-11-20 LAB — COMPREHENSIVE METABOLIC PANEL
ALT: 30 U/L (ref 0–44)
AST: 41 U/L (ref 15–41)
Albumin: 2.2 g/dL — ABNORMAL LOW (ref 3.5–5.0)
Alkaline Phosphatase: 194 U/L — ABNORMAL HIGH (ref 38–126)
Anion gap: 6 (ref 5–15)
BUN: 96 mg/dL — ABNORMAL HIGH (ref 6–20)
CO2: 31 mmol/L (ref 22–32)
Calcium: 8.3 mg/dL — ABNORMAL LOW (ref 8.9–10.3)
Chloride: 107 mmol/L (ref 98–111)
Creatinine, Ser: 5.91 mg/dL — ABNORMAL HIGH (ref 0.61–1.24)
GFR, Estimated: 11 mL/min — ABNORMAL LOW (ref >60)
Glucose, Bld: 78 mg/dL (ref 70–99)
Potassium: 4 mmol/L (ref 3.5–5.1)
Sodium: 144 mmol/L (ref 135–145)
Total Bilirubin: 1.9 mg/dL — ABNORMAL HIGH (ref 0.3–1.2)
Total Protein: 5.8 g/dL — ABNORMAL LOW (ref 6.5–8.1)

## 2021-11-20 LAB — CBC
HCT: 22.8 % — ABNORMAL LOW (ref 39.0–52.0)
Hemoglobin: 6.6 g/dL — ABNORMAL LOW (ref 13.0–17.0)
MCH: 32.2 pg (ref 26.0–34.0)
MCHC: 28.9 g/dL — ABNORMAL LOW (ref 30.0–36.0)
MCV: 111.2 fL — ABNORMAL HIGH (ref 80.0–100.0)
Platelets: 106 10*3/uL — ABNORMAL LOW (ref 150–400)
RBC: 2.05 MIL/uL — ABNORMAL LOW (ref 4.22–5.81)
RDW: 24.6 % — ABNORMAL HIGH (ref 11.5–15.5)
WBC: 4.3 10*3/uL (ref 4.0–10.5)
nRBC: 0 % (ref 0.0–0.2)

## 2021-11-20 LAB — TROPONIN I (HIGH SENSITIVITY)
Troponin I (High Sensitivity): 26 ng/L — ABNORMAL HIGH (ref <18)
Troponin I (High Sensitivity): 26 ng/L — ABNORMAL HIGH (ref <18)

## 2021-11-20 MED ORDER — MIDAZOLAM HCL 2 MG/2ML IJ SOLN
INTRAMUSCULAR | Status: AC
  Filled 2021-11-20: qty 2

## 2021-11-20 MED ORDER — CEPHALEXIN 500 MG PO CAPS: 500.0000 mg | ORAL_CAPSULE | Freq: Three times a day (TID) | ORAL | 0 refills | Status: AC

## 2021-11-20 MED ORDER — FENTANYL CITRATE PF 50 MCG/ML IJ SOSY: 25.0000 ug | PREFILLED_SYRINGE | Freq: Once | INTRAMUSCULAR | Status: DC

## 2021-11-20 MED ORDER — FENTANYL CITRATE PF 50 MCG/ML IJ SOSY
50.0000 ug | PREFILLED_SYRINGE | Freq: Once | INTRAMUSCULAR | Status: AC
  Administered 2021-11-20: 50 ug via INTRAVENOUS

## 2021-11-20 MED ORDER — FERROUS GLUCONATE 324 (38 FE) MG PO TABS: 324.0000 mg | ORAL_TABLET | Freq: Every day | ORAL | 0 refills | Status: DC

## 2021-11-20 MED ORDER — FENTANYL CITRATE PF 50 MCG/ML IJ SOSY
PREFILLED_SYRINGE | INTRAMUSCULAR | Status: AC
  Filled 2021-11-20: qty 1

## 2021-11-20 MED ORDER — LIDOCAINE-EPINEPHRINE 2 %-1:100000 IJ SOLN
20.0000 mL | Freq: Once | INTRAMUSCULAR | Status: AC
  Administered 2021-11-20: 20 mL via INTRADERMAL

## 2021-11-20 MED ORDER — CEFAZOLIN SODIUM-DEXTROSE 1-4 GM/50ML-% IV SOLN
1.0000 g | Freq: Once | INTRAVENOUS | Status: AC
  Administered 2021-11-20: 1 g via INTRAVENOUS
  Filled 2021-11-20: qty 50

## 2021-11-20 MED ORDER — MIDAZOLAM HCL 2 MG/2ML IJ SOLN: 2.0000 mg | Freq: Once | INTRAMUSCULAR | Status: DC

## 2021-11-20 NOTE — ED Notes (Signed)
2+ pulses in L pedal

## 2021-11-20 NOTE — ED Notes (Signed)
Pt advised by RN that a typing screen is needed. Pt adamantly refused and said he "did not want to get HIV or Hepatitis". RN educated pt of blood screening process to protect pts who receive transfusion as well as the risks of not receiving blood resuscitation. Pt continued to refuse. MD notified.

## 2021-11-20 NOTE — Discharge Instructions (Addendum)
I think the best thing to do for you would be to stay in the hospital overnight and let us check you out for your syncope or passing out and also to give you some blood.  Your blood count is quite low and that does not reflect the blood that you lost from the laceration on your leg.  You probably lost another unit of blood from the leg.  If you decide not to stay you will have to leave against advice.  I will give you some antibiotics Keflex 1 pill 3 times a day to try to prevent infection in the leg wound.  Also I would recommend that you go to the pharmacy and get some iron pills and take 1 a day for the next couple months.  Be very careful little children usually like to take those iron pills because they look like candy but it is very easy for little children to overdose on them and they can die from that very easily so make sure you keep them away from everybody.  Return here or see your doctor for any signs of infection in the leg.  This would include increasing redness pain or swelling or fever or red streaks up the leg.  Try to keep the leg elevated is much as you can if you are not having to walk on it.  Do not drive until you get the syncope or passing out spells checked out thoroughly.  You do not want to pass out and hit a person and kill them.
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
## 2021-11-24 ENCOUNTER — Ambulatory Visit: Payer: Self-pay

## 2021-11-24 ENCOUNTER — Ambulatory Visit (INDEPENDENT_AMBULATORY_CARE_PROVIDER_SITE_OTHER): Payer: PPO | Admitting: Physician Assistant

## 2021-11-24 VITALS — BP 124/62 | HR 79 | Ht 71.0 in | Wt 364.0 lb

## 2021-11-24 DIAGNOSIS — S81812D Laceration without foreign body, left lower leg, subsequent encounter: Secondary | ICD-10-CM | POA: Diagnosis not present

## 2021-11-24 DIAGNOSIS — S81812A Laceration without foreign body, left lower leg, initial encounter: Secondary | ICD-10-CM | POA: Insufficient documentation
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
## 2021-11-24 NOTE — Progress Notes (Deleted)
Established Patient Office Visit  Name: [name]   MRN: [id number]    DOB: 12-09-1965   Date:11/24/2021  Today's Provider: Talitha Givens, MHS, PA-C Introduced myself to the patient as a PA-C and provided education on APPs in clinical practice.         Subjective  Chief Complaint  No chief complaint on file.   HPI   Patient Active Problem List   Diagnosis Date Noted   AKI (acute kidney injury) ([place]) 11/12/2021   Pressure injury of skin 11/12/2021   Hypotension 11/11/2021   Hyponatremia 11/11/2021   Thrombocytopenia ([place]) 11/11/2021   Acute kidney injury superimposed on CKD ([place]) 11/10/2021   Diarrhea 11/10/2021   Volume overload 11/10/2021   Background diabetic retinopathy ([place]) 10/17/2018   Chronic pain syndrome 01/21/2017   Chronic restrictive lung disease 02/19/2016   Primary osteoarthritis involving multiple joints 01/24/2016   Asthma 09/03/2015   Elevated transaminase level 06/04/2015   Excessive daytime sleepiness 05/07/2015   GERD (gastroesophageal reflux disease) 05/07/2015   Noncompliance 04/09/2015   Allergic rhinitis 04/09/2015   Hyperlipidemia associated with type 2 diabetes mellitus ([place]) 02/15/2015   Vitamin D deficiency 01/17/2015   Elevated CO2 level 01/17/2015   Tobacco abuse, episodic 01/17/2015   Insomnia 01/17/2015   Current tobacco use 01/11/2015   Benign hypertension with CKD (chronic kidney disease) stage IV ([place]) 07/10/2014   Proteinuria 07/10/2014   Type 2 diabetes mellitus, controlled, with renal complications ([place]) 27/74/1287   CKD (chronic kidney disease), stage IV ([place]) 07/10/2014   Secondary renal hyperparathyroidism ([place]) 07/10/2014   Cardiomyopathy, dilated, nonischemic ([place])    Morbid obesity with BMI of 45.0-49.9, adult ([place])    Gout    Chronic systolic heart failure ([place]) 04/29/2011    Past Surgical History:  Procedure Laterality Date   TEMPORARY DIALYSIS CATHETER Right 11/14/2021   Procedure: TEMPORARY  DIALYSIS CATHETER;  Surgeon: Algernon Huxley, MD;  Location: [facility];  Service: Cardiovascular;  Laterality: Right;    Family History  Problem Relation Age of Onset   Diabetes Mother    Hypertension Mother    Alcohol abuse Father    Glaucoma Father    Diabetes Father    Hypertension Father    Cancer Neg Hx    COPD Neg Hx    Heart disease Neg Hx    Stroke Neg Hx     Social History   Tobacco Use   Smoking status: Never   Smokeless tobacco: Never  Substance Use Topics   Alcohol use: Yes    Comment: a glass of wine and a couple beers a day     Current Outpatient Medications:    albuterol (PROVENTIL) (2.5 MG/3ML) 0.083% nebulizer solution, Take 3 mLs (2.5 mg total) by nebulization every 6 (six) hours as needed for wheezing or shortness of breath., Disp: 150 mL, Rfl: 3   albuterol (VENTOLIN HFA) 108 (90 Base) MCG/ACT inhaler, Inhale 2 puffs into the lungs every 4 (four) hours as needed for wheezing or shortness of breath., Disp: 18 g, Rfl: 3   allopurinol (ZYLOPRIM) 100 MG tablet, Take 2 tablets (200 mg total) by mouth daily., Disp: 180 tablet, Rfl: 3   atorvastatin (LIPITOR) 20 MG tablet, Take 1 tablet (20 mg total) by mouth daily., Disp: 90 tablet, Rfl: 3   blood glucose meter kit and supplies KIT, Brand of choice; LON 99 months; check FSBS 3x a day; E11.65; disp one meter with 100  strips + 1 refill of strips, Disp: 1 each, Rfl: 0   Blood Glucose Monitoring Suppl (ONE TOUCH ULTRA 2) w/Device KIT, Use to check blood glucose up to 3 x daily, Disp: 1 kit, Rfl: 0   bumetanide (BUMEX) 1 MG tablet, Take by mouth. Take 2 tablets (2 mg total) by mouth in the morning and 2 tablets (2 mg total) at noon and 2 tablets (2 mg total) in the evening., Disp: , Rfl:    calcitRIOL (ROCALTROL) 0.5 MCG capsule, Take 2 capsules (1 mcg total) by mouth 1 (one) time each day (Patient not taking: Reported on 11/11/2021), Disp: , Rfl:    carvedilol (COREG) 25 MG tablet, Take 2 tablets (50 mg total)  by mouth 2 (two) times daily with a meal., Disp: 360 tablet, Rfl: 3   cephALEXin (KEFLEX) 500 MG capsule, Take 1 capsule (500 mg total) by mouth 3 (three) times daily for 10 days., Disp: 30 capsule, Rfl: 0   Dulaglutide (TRULICITY) 8.82 CM/0.3KJ SOPN, Inject 0.75 mg into the skin once a week., Disp: , Rfl:    esomeprazole (NEXIUM) 40 MG capsule, Take 1 capsule (40 mg total) by mouth 2 (two) times daily before a meal., Disp: 180 capsule, Rfl: 0   ferrous gluconate (FERGON) 324 MG tablet, Take 1 tablet (324 mg total) by mouth daily with breakfast., Disp: 100 tablet, Rfl: 0   fluticasone (FLONASE) 50 MCG/ACT nasal spray, Place 2 sprays into both nostrils daily. Use for 4-6 weeks then stop and use seasonally or as needed., Disp: 16 g, Rfl: 3   Insulin Glargine (BASAGLAR KWIKPEN) 100 UNIT/ML SOPN, Inject 10 Units into the skin daily. Submitted to [employer] for assistance, Disp: , Rfl:    Insulin Pen Needle (B-D UF III MINI PEN NEEDLES) 31G X 5 MM MISC, 1 each by Does not apply route daily. Use once daily with Victoza., Disp: 100 each, Rfl: 12   metolazone (ZAROXOLYN) 5 MG tablet, Take 1 tablet (5 mg total) by mouth 1 (one) time each day if needed (for weight gain), Disp: , Rfl:    OneTouch Delica Lancets 17H MISC, Use to check blood sugar 3 x daily, Disp: 300 each, Rfl: 3   ONETOUCH ULTRA test strip, Check blood sugar 3 x daily, Disp: 300 each, Rfl: 3   potassium chloride (KLOR-CON) 10 MEQ tablet, Take 10 mEq by mouth daily., Disp: , Rfl:    simethicone (MYLICON) 150 MG chewable tablet, Chew 125 mg by mouth every 6 (six) hours as needed for flatulence., Disp: , Rfl:    traMADol (ULTRAM) 50 MG tablet, Take 1-2 tablets (50-100 mg total) by mouth every 12 (twelve) hours as needed., Disp: 45 tablet, Rfl: 2  Allergies  Allergen Reactions   Talc     I personally reviewed {Reviewed:[id number]} with the patient/caregiver today.   ROS    Objective  There were no vitals filed for this visit.  There is  no height or weight on file to calculate BMI.  Physical Exam   Recent Results (from the past 2160 hour(s))  Lipase, blood     Status: Abnormal   Collection Time: 11/10/21  2:04 PM  Result Value Ref Range   Lipase 97 (H) 11 - 51 U/L    Comment: Performed at [facility], [address] [place], [place] [address]  Comprehensive metabolic panel     Status: Abnormal   Collection Time: 11/10/21  2:04 PM  Result Value Ref Range   Sodium 123 (L) 135 -  145 mmol/L   Potassium 3.7 3.5 - 5.1 mmol/L   Chloride 83 (L) 98 - 111 mmol/L   CO2 29 22 - 32 mmol/L   Glucose, Bld 77 70 - 99 mg/dL    Comment: Glucose reference range applies only to samples taken after fasting for at least 8 hours.   BUN 104 (H) 6 - 20 mg/dL    Comment: RESULT CONFIRMED BY MANUAL DILUTION. QSD   Creatinine, Ser 8.08 (H) 0.61 - 1.24 mg/dL   Calcium 7.9 (L) 8.9 - 10.3 mg/dL   Total Protein 5.8 (L) 6.5 - 8.1 g/dL   Albumin 2.1 (L) 3.5 - 5.0 g/dL   AST 61 (H) 15 - 41 U/L   ALT 55 (H) 0 - 44 U/L   Alkaline Phosphatase 468 (H) 38 - 126 U/L   Total Bilirubin 3.4 (H) 0.3 - 1.2 mg/dL   GFR, Estimated 7 (L) >60 mL/min    Comment: (NOTE) Calculated using the CKD-EPI Creatinine Equation (2021)    Anion gap 11 5 - 15    Comment: Performed at [facility], [address] [place], [place] [address]  CBC     Status: Abnormal   Collection Time: 11/10/21  2:04 PM  Result Value Ref Range   WBC 4.4 4.0 - 10.5 K/uL   RBC 3.62 (L) 4.22 - 5.81 MIL/uL   Hemoglobin 11.1 (L) 13.0 - 17.0 g/dL   HCT 36.1 (L) 39.0 - 52.0 %   MCV 99.7 80.0 - 100.0 fL   MCH 30.7 26.0 - 34.0 pg   MCHC 30.7 30.0 - 36.0 g/dL   RDW 23.9 (H) 11.5 - 15.5 %   Platelets 90 (L) 150 - 400 K/uL    Comment: Immature Platelet Fraction may be clinically indicated, consider ordering this additional test [id number] REPEATED TO VERIFY    nRBC 1.4 (H) 0.0 - 0.2 %    Comment: Performed at [facility], [address] [place],  [place] [address]  Ethanol     Status: None   Collection Time: 11/10/21  2:04 PM  Result Value Ref Range   Alcohol, Ethyl (B) <10 <10 mg/dL    Comment: (NOTE) Lowest detectable limit for serum alcohol is 10 mg/dL.  For medical purposes only. Performed at [facility], [address] [place], [place] [address]   CK     Status: None   Collection Time: 11/10/21  2:04 PM  Result Value Ref Range   Total CK 369 49 - 397 U/L    Comment: Performed at [facility], [address] [place], [place] [address]  Urinalysis, Routine w reflex microscopic Urine, Clean Catch     Status: Abnormal   Collection Time: 11/10/21 11:03 PM  Result Value Ref Range   Color, Urine YELLOW (A) YELLOW   APPearance CLEAR (A) CLEAR   Specific Gravity, Urine 1.010 1.005 - 1.030   pH 5.0 5.0 - 8.0   Glucose, UA NEGATIVE NEGATIVE mg/dL   Hgb urine dipstick NEGATIVE NEGATIVE   Bilirubin Urine NEGATIVE NEGATIVE   Ketones, ur NEGATIVE NEGATIVE mg/dL   Protein, ur NEGATIVE NEGATIVE mg/dL   Nitrite NEGATIVE NEGATIVE   Leukocytes,Ua NEGATIVE NEGATIVE    Comment: Performed at [facility], [address] [place], [place] [address]  Urine Drug Screen, Qualitative     Status: None   Collection Time: 11/10/21 11:03 PM  Result Value Ref Range   Tricyclic, Ur Screen NONE DETECTED NONE DETECTED   Amphetamines, Ur Screen  NONE DETECTED NONE DETECTED   MDMA (Ecstasy)Ur Screen NONE DETECTED NONE DETECTED   Cocaine Metabolite,Ur [place] NONE DETECTED NONE DETECTED   Opiate, Ur Screen NONE DETECTED NONE DETECTED   Phencyclidine (PCP) Ur S NONE DETECTED NONE DETECTED   Cannabinoid 50 Ng, Ur [place] NONE DETECTED NONE DETECTED   Barbiturates, Ur Screen NONE DETECTED NONE DETECTED   Benzodiazepine, Ur Scrn NONE DETECTED NONE DETECTED   Methadone Scn, Ur NONE DETECTED NONE DETECTED    Comment: (NOTE) Tricyclics + metabolites, urine    Cutoff 1000 ng/mL Amphetamines + metabolites, urine  Cutoff 1000 ng/mL MDMA  (Ecstasy), urine              Cutoff 500 ng/mL Cocaine Metabolite, urine          Cutoff 300 ng/mL Opiate + metabolites, urine        Cutoff 300 ng/mL Phencyclidine (PCP), urine         Cutoff 25 ng/mL Cannabinoid, urine                 Cutoff 50 ng/mL Barbiturates + metabolites, urine  Cutoff 200 ng/mL Benzodiazepine, urine              Cutoff 200 ng/mL Methadone, urine                   Cutoff 300 ng/mL  The urine drug screen provides only a preliminary, unconfirmed analytical test result and should not be used for non-medical purposes. Clinical consideration and professional judgment should be applied to any positive drug screen result due to possible interfering substances. A more specific alternate chemical method must be used in order to obtain a confirmed analytical result. Gas chromatography / mass spectrometry (GC/MS) is the preferred confirm atory method. Performed at [facility], [address] [place], [place] [address]   Gastrointestinal Panel by PCR , Stool     Status: None   Collection Time: 11/10/21 11:03 PM   Specimen: Stool  Result Value Ref Range   Campylobacter species NOT DETECTED NOT DETECTED   Plesimonas shigelloides NOT DETECTED NOT DETECTED   Salmonella species NOT DETECTED NOT DETECTED   Yersinia enterocolitica NOT DETECTED NOT DETECTED   Vibrio species NOT DETECTED NOT DETECTED   Vibrio cholerae NOT DETECTED NOT DETECTED   Enteroaggregative E coli (EAEC) NOT DETECTED NOT DETECTED   Enteropathogenic E coli (EPEC) NOT DETECTED NOT DETECTED   Enterotoxigenic E coli (ETEC) NOT DETECTED NOT DETECTED   Shiga like toxin producing E coli (STEC) NOT DETECTED NOT DETECTED   Shigella/Enteroinvasive E coli (EIEC) NOT DETECTED NOT DETECTED   Cryptosporidium NOT DETECTED NOT DETECTED   Cyclospora cayetanensis NOT DETECTED NOT DETECTED   Entamoeba histolytica NOT DETECTED NOT DETECTED   Giardia lamblia NOT DETECTED NOT DETECTED   Adenovirus F40/41 NOT  DETECTED NOT DETECTED   Astrovirus NOT DETECTED NOT DETECTED   Norovirus GI/GII NOT DETECTED NOT DETECTED   Rotavirus A NOT DETECTED NOT DETECTED   Sapovirus (I, II, IV, and V) NOT DETECTED NOT DETECTED    Comment: Performed at [facility], [address] [place], [place] [address]  C Difficile Quick Screen w PCR reflex     Status: None   Collection Time: 11/10/21 11:03 PM   Specimen: Stool  Result Value Ref Range   C Diff antigen NEGATIVE NEGATIVE   C Diff toxin NEGATIVE NEGATIVE   C Diff interpretation No C. difficile detected.     Comment: Performed at [facility], [place], [place] [address]  SARS Coronavirus 2 by RT PCR (hospital order, performed in [facility] hospital lab) *cepheid single result test* Anterior Nasal Swab     Status: None   Collection Time: 11/10/21 11:03 PM   Specimen: Anterior Nasal Swab  Result Value Ref Range   SARS Coronavirus 2 by RT PCR NEGATIVE NEGATIVE    Comment: (NOTE) SARS-CoV-2 target nucleic acids are NOT DETECTED.  The SARS-CoV-2 RNA is generally detectable in upper and lower respiratory specimens during the acute phase of infection. The lowest concentration of SARS-CoV-2 viral copies this assay can detect is 250 copies / mL. A negative result does not preclude SARS-CoV-2 infection and should not be used as the sole basis for treatment or other patient management decisions.  A negative result may occur with improper specimen collection / handling, submission of specimen other than nasopharyngeal swab, presence of viral mutation(s) within the areas targeted by this assay, and inadequate number of viral copies (<250 copies / mL). A negative result must be combined with clinical observations, patient history, and epidemiological information.  Fact Sheet for Patients:   https://www.patel.info/  Fact Sheet for Healthcare Providers: https://hall.com/  This test is  not yet approved or  cleared by the [place] FDA and has been authorized for detection and/or diagnosis of SARS-CoV-2 by FDA under an Emergency Use Authorization (EUA).  This EUA will remain in effect (meaning this test can be used) for the duration of the COVID-19 declaration under Section 564(b)(1) of the Act, 21 U.S.C. section 360bbb-3(b)(1), unless the authorization is terminated or revoked sooner.  Performed at [facility], [address] [place], [place] [address]   Brain natriuretic peptide     Status: Abnormal   Collection Time: 11/10/21 11:03 PM  Result Value Ref Range   B Natriuretic Peptide 1,223.0 (H) 0.0 - 100.0 pg/mL    Comment: Performed at [facility], [place], [place] [address]  Troponin I (High Sensitivity)     Status: Abnormal   Collection Time: 11/10/21 11:03 PM  Result Value Ref Range   Troponin I (High Sensitivity) 36 (H) <18 ng/L    Comment: (NOTE) Elevated high sensitivity troponin I (hsTnI) values and significant  changes across serial measurements may suggest ACS but many other  chronic and acute conditions are known to elevate hsTnI results.  Refer to the "Links" section for chest pain algorithms and additional  guidance. Performed at [facility], [address] [place], [place] [address]   HIV Antibody (routine testing w rflx)     Status: None   Collection Time: 11/10/21 11:03 PM  Result Value Ref Range   HIV Screen 4th Generation wRfx Non Reactive Non Reactive    Comment: Performed at [facility], [place] [address], [place],  [address]  Basic metabolic panel     Status: Abnormal   Collection Time: 11/10/21 11:03 PM  Result Value Ref Range   Sodium 125 (L) 135 - 145 mmol/L   Potassium 3.7 3.5 - 5.1 mmol/L   Chloride 83 (L) 98 - 111 mmol/L   CO2 30 22 - 32 mmol/L   Glucose, Bld 102 (H) 70 - 99 mg/dL    Comment: Glucose reference range applies only to samples taken after fasting for  at least 8 hours.   BUN 101 (H) 6 - 20 mg/dL    Comment: RESULT CONFIRMED BY MANUAL DILUTION SKL   Creatinine, Ser 8.22 (H) 0.61 -  1.24 mg/dL   Calcium 8.0 (L) 8.9 - 10.3 mg/dL   GFR, Estimated 7 (L) >60 mL/min    Comment: (NOTE) Calculated using the CKD-EPI Creatinine Equation (2021)    Anion gap 12 5 - 15    Comment: Performed at [facility], [address] [place], [place] [address]  CBC     Status: Abnormal   Collection Time: 11/10/21 11:03 PM  Result Value Ref Range   WBC 4.4 4.0 - 10.5 K/uL   RBC 3.62 (L) 4.22 - 5.81 MIL/uL   Hemoglobin 10.9 (L) 13.0 - 17.0 g/dL   HCT 36.8 (L) 39.0 - 52.0 %   MCV 101.7 (H) 80.0 - 100.0 fL   MCH 30.1 26.0 - 34.0 pg   MCHC 29.6 (L) 30.0 - 36.0 g/dL   RDW 24.4 (H) 11.5 - 15.5 %   Platelets 90 (L) 150 - 400 K/uL    Comment: Immature Platelet Fraction may be clinically indicated, consider ordering this additional test [id number]    nRBC 1.1 (H) 0.0 - 0.2 %    Comment: Performed at [facility], [address] [place], [place] [address]  Ammonia     Status: Abnormal   Collection Time: 11/10/21 11:03 PM  Result Value Ref Range   Ammonia 52 (H) 9 - 35 umol/L    Comment: Performed at [facility], [address] [place], [place] [address]  CBG monitoring, ED     Status: Abnormal   Collection Time: 11/10/21 11:41 PM  Result Value Ref Range   Glucose-Capillary 114 (H) 70 - 99 mg/dL    Comment: Glucose reference range applies only to samples taken after fasting for at least 8 hours.  Troponin I (High Sensitivity)     Status: Abnormal   Collection Time: 11/11/21 12:26 AM  Result Value Ref Range   Troponin I (High Sensitivity) 36 (H) <18 ng/L    Comment: (NOTE) Elevated high sensitivity troponin I (hsTnI) values and significant  changes across serial measurements may suggest ACS but many other  chronic and acute conditions are known to elevate hsTnI results.  Refer to the "Links" section for chest pain  algorithms and additional  guidance. Performed at [facility], [address] [place], [place] [address]   CBG monitoring, ED     Status: Abnormal   Collection Time: 11/11/21  7:38 AM  Result Value Ref Range   Glucose-Capillary 145 (H) 70 - 99 mg/dL    Comment: Glucose reference range applies only to samples taken after fasting for at least 8 hours.  CBC     Status: Abnormal   Collection Time: 11/11/21  8:59 AM  Result Value Ref Range   WBC 4.5 4.0 - 10.5 K/uL   RBC 3.55 (L) 4.22 - 5.81 MIL/uL   Hemoglobin 10.7 (L) 13.0 - 17.0 g/dL   HCT 36.2 (L) 39.0 - 52.0 %   MCV 102.0 (H) 80.0 - 100.0 fL   MCH 30.1 26.0 - 34.0 pg   MCHC 29.6 (L) 30.0 - 36.0 g/dL   RDW 24.5 (H) 11.5 - 15.5 %   Platelets 96 (L) 150 - 400 K/uL    Comment: Immature Platelet Fraction may be clinically indicated, consider ordering this additional test [id number]    nRBC 1.1 (H) 0.0 - 0.2 %    Comment: Performed at [facility], [address], [place], [place] [address]  Basic metabolic panel     Status: Abnormal   Collection Time: 11/11/21  8:59 AM  Result Value Ref Range   Sodium 127 (L) 135 - 145 mmol/L   Potassium 3.7 3.5 - 5.1 mmol/L   Chloride 87 (L) 98 - 111 mmol/L   CO2 29 22 - 32 mmol/L   Glucose, Bld 146 (H) 70 - 99 mg/dL    Comment: Glucose reference range applies only to samples taken after fasting for at least 8 hours.   BUN 112 (H) 6 - 20 mg/dL    Comment: RESULT CONFIRMED BY MANUAL DILUTION/HNM   Creatinine, Ser 8.15 (H) 0.61 - 1.24 mg/dL   Calcium 8.1 (L) 8.9 - 10.3 mg/dL   GFR, Estimated 7 (L) >60 mL/min    Comment: (NOTE) Calculated using the CKD-EPI Creatinine Equation (2021)    Anion gap 11 5 - 15    Comment: Performed at [facility], [address] [place], [place] [address]  CBG monitoring, ED     Status: Abnormal   Collection Time: 11/11/21 11:36 AM  Result Value Ref Range   Glucose-Capillary 109 (H) 70 - 99 mg/dL    Comment: Glucose reference  range applies only to samples taken after fasting for at least 8 hours.  ECHOCARDIOGRAM COMPLETE     Status: None   Collection Time: 11/11/21  3:23 PM  Result Value Ref Range   Weight 5,425.08 oz   Height 71 in   BP 111/73 mmHg   S' Lateral 4.50 cm   Area-P 1/2 4.80 cm2  CBG monitoring, ED     Status: None   Collection Time: 11/11/21  4:10 PM  Result Value Ref Range   Glucose-Capillary 98 70 - 99 mg/dL    Comment: Glucose reference range applies only to samples taken after fasting for at least 8 hours.  CBG monitoring, ED     Status: Abnormal   Collection Time: 11/11/21 10:51 PM  Result Value Ref Range   Glucose-Capillary 102 (H) 70 - 99 mg/dL    Comment: Glucose reference range applies only to samples taken after fasting for at least 8 hours.  Hepatitis C antibody     Status: Abnormal   Collection Time: 11/12/21  4:41 AM  Result Value Ref Range   HCV Ab Non Reactive (A) Non Reactive    Comment: (NOTE) HCV antibody alone does not differentiate between previously resolved infection and active infection. Equivocal and Reactive HCV antibody results should be followed up with an HCV RNA test to support the diagnosis of active HCV infection. Performed At: [facility] [place], [place] [id number] Rush Farmer MD FM:[id number]   CBC     Status: Abnormal   Collection Time: 11/12/21  4:41 AM  Result Value Ref Range   WBC 4.8 4.0 - 10.5 K/uL   RBC 3.40 (L) 4.22 - 5.81 MIL/uL   Hemoglobin 10.7 (L) 13.0 - 17.0 g/dL   HCT 35.2 (L) 39.0 - 52.0 %   MCV 103.5 (H) 80.0 - 100.0 fL   MCH 31.5 26.0 - 34.0 pg   MCHC 30.4 30.0 - 36.0 g/dL   RDW 24.1 (H) 11.5 - 15.5 %   Platelets 102 (L) 150 - 400 K/uL   nRBC 1.7 (H) 0.0 - 0.2 %    Comment: Performed at [facility], [address], [place], [place] [address]  Basic metabolic panel     Status: Abnormal   Collection Time: 11/12/21  4:41 AM  Result Value Ref Range   Sodium 131 (L) 135 - 145 mmol/L  Potassium 3.9 3.5 - 5.1 mmol/L   Chloride 92 (L) 98 - 111 mmol/L   CO2 30 22 - 32 mmol/L   Glucose, Bld 102 (H) 70 - 99 mg/dL    Comment: Glucose reference range applies only to samples taken after fasting for at least 8 hours.   BUN 115 (H) 6 - 20 mg/dL    Comment: RESULT CONFIRMED BY MANUAL DILUTION RH   Creatinine, Ser 8.26 (H) 0.61 - 1.24 mg/dL   Calcium 8.1 (L) 8.9 - 10.3 mg/dL   GFR, Estimated 7 (L) >60 mL/min    Comment: (NOTE) Calculated using the CKD-EPI Creatinine Equation (2021)    Anion gap 9 5 - 15    Comment: Performed at [facility], [address] [place], [place] [address]  CBG monitoring, ED     Status: Abnormal   Collection Time: 11/12/21  7:59 AM  Result Value Ref Range   Glucose-Capillary 115 (H) 70 - 99 mg/dL    Comment: Glucose reference range applies only to samples taken after fasting for at least 8 hours.   Comment 1 Notify RN   CBG monitoring, ED     Status: Abnormal   Collection Time: 11/12/21 11:40 AM  Result Value Ref Range   Glucose-Capillary 127 (H) 70 - 99 mg/dL    Comment: Glucose reference range applies only to samples taken after fasting for at least 8 hours.  Glucose, capillary     Status: None   Collection Time: 11/12/21  4:28 PM  Result Value Ref Range   Glucose-Capillary 90 70 - 99 mg/dL    Comment: Glucose reference range applies only to samples taken after fasting for at least 8 hours.  Glucose, capillary     Status: Abnormal   Collection Time: 11/12/21  9:26 PM  Result Value Ref Range   Glucose-Capillary 112 (H) 70 - 99 mg/dL    Comment: Glucose reference range applies only to samples taken after fasting for at least 8 hours.  Basic metabolic panel     Status: Abnormal   Collection Time: 11/13/21  8:29 AM  Result Value Ref Range   Sodium 135 135 - 145 mmol/L   Potassium 3.9 3.5 - 5.1 mmol/L   Chloride 96 (L) 98 - 111 mmol/L   CO2 29 22 - 32 mmol/L   Glucose, Bld 112 (H) 70 - 99 mg/dL    Comment: Glucose reference range  applies only to samples taken after fasting for at least 8 hours.   BUN 124 (H) 6 - 20 mg/dL    Comment: RESULT CONFIRMED BY MANUAL DILUTION MW   Creatinine, Ser 8.66 (H) 0.61 - 1.24 mg/dL   Calcium 8.5 (L) 8.9 - 10.3 mg/dL   GFR, Estimated 7 (L) >60 mL/min    Comment: (NOTE) Calculated using the CKD-EPI Creatinine Equation (2021)    Anion gap 10 5 - 15    Comment: Performed at [facility], [address] [place], [place] [address]  CBC     Status: Abnormal   Collection Time: 11/13/21  8:29 AM  Result Value Ref Range   WBC 4.9 4.0 - 10.5 K/uL   RBC 3.51 (L) 4.22 - 5.81 MIL/uL   Hemoglobin 10.9 (L) 13.0 - 17.0 g/dL   HCT 37.1 (L) 39.0 - 52.0 %   MCV 105.7 (H) 80.0 - 100.0 fL   MCH 31.1 26.0 - 34.0 pg   MCHC 29.4 (L) 30.0 - 36.0 g/dL   RDW 24.8 (H) 11.5 -  15.5 %   Platelets 100 (L) 150 - 400 K/uL   nRBC 1.0 (H) 0.0 - 0.2 %    Comment: Performed at [facility], [address] [place], [place] [address]  Glucose, capillary     Status: Abnormal   Collection Time: 11/13/21  8:38 AM  Result Value Ref Range   Glucose-Capillary 107 (H) 70 - 99 mg/dL    Comment: Glucose reference range applies only to samples taken after fasting for at least 8 hours.  Glucose, capillary     Status: Abnormal   Collection Time: 11/13/21 12:32 PM  Result Value Ref Range   Glucose-Capillary 118 (H) 70 - 99 mg/dL    Comment: Glucose reference range applies only to samples taken after fasting for at least 8 hours.  Glucose, capillary     Status: Abnormal   Collection Time: 11/13/21  4:47 PM  Result Value Ref Range   Glucose-Capillary 129 (H) 70 - 99 mg/dL    Comment: Glucose reference range applies only to samples taken after fasting for at least 8 hours.  Hepatitis B surface antigen     Status: Abnormal   Collection Time: 11/13/21  5:20 PM  Result Value Ref Range   Hepatitis B Surface Ag See Scanned report in [place] (A) NON REACTIVE    Comment: Performed at [employer] Performed at [facility], [place] [address], [place],  [address]   Hepatitis B surface antibody,quantitative     Status: Abnormal   Collection Time: 11/13/21  5:20 PM  Result Value Ref Range   Hep B S AB Quant (Post) <3.1 (L) Immunity>9.9 mIU/mL    Comment: (NOTE)  Status of Immunity                     Anti-HBs Level  ------------------                     -------------- Inconsistent with Immunity                   0.0 - 9.9 Consistent with Immunity                          >9.9 Performed At: [facility] [place], [place] [id number] Rush Farmer MD [id number]   Hepatitis B e antibody     Status: None   Collection Time: 11/13/21  5:20 PM  Result Value Ref Range   Hep B E Ab Negative Negative    Comment: (NOTE) Performed At: [facility] [place], [place] [id number] Rush Farmer MD [id number]   Hepatitis B core antibody, total     Status: Abnormal   Collection Time: 11/13/21  5:20 PM  Result Value Ref Range   Hep B Core Total Ab See Scanned report in [place] (A) NON REACTIVE    Comment: Performed at [employer] Performed at [facility], [place] [address] [place], [place] [address]   Glucose, capillary     Status: Abnormal   Collection Time: 11/13/21  9:01 PM  Result Value Ref Range   Glucose-Capillary 157 (H) 70 - 99 mg/dL    Comment: Glucose reference range applies only to samples taken after fasting for at least 8 hours.  Basic metabolic panel     Status: Abnormal   Collection Time: 11/14/21  4:40 AM  Result Value Ref Range   Sodium  135 135 - 145 mmol/L   Potassium 3.7 3.5 - 5.1 mmol/L   Chloride 96 (L) 98 - 111 mmol/L   CO2 29 22 - 32 mmol/L   Glucose, Bld 118 (H) 70 - 99 mg/dL    Comment: Glucose reference range applies only to samples taken after fasting for at least 8 hours.   BUN 128 (H) 6 - 20 mg/dL    Comment: RESULT CONFIRMED BY MANUAL DILUTION RH   Creatinine,  Ser 8.37 (H) 0.61 - 1.24 mg/dL   Calcium 8.2 (L) 8.9 - 10.3 mg/dL   GFR, Estimated 7 (L) >60 mL/min    Comment: (NOTE) Calculated using the CKD-EPI Creatinine Equation (2021)    Anion gap 10 5 - 15    Comment: Performed at [facility], [address] [place], [place] [address]  Magnesium     Status: None   Collection Time: 11/14/21  4:40 AM  Result Value Ref Range   Magnesium 1.9 1.7 - 2.4 mg/dL    Comment: Performed at [facility], [address] [place], [place] [address]  CBC     Status: Abnormal   Collection Time: 11/14/21  4:40 AM  Result Value Ref Range   WBC 4.7 4.0 - 10.5 K/uL   RBC 3.20 (L) 4.22 - 5.81 MIL/uL   Hemoglobin 9.9 (L) 13.0 - 17.0 g/dL   HCT 33.7 (L) 39.0 - 52.0 %   MCV 105.3 (H) 80.0 - 100.0 fL   MCH 30.9 26.0 - 34.0 pg   MCHC 29.4 (L) 30.0 - 36.0 g/dL   RDW 24.3 (H) 11.5 - 15.5 %   Platelets 103 (L) 150 - 400 K/uL    Comment: Immature Platelet Fraction may be clinically indicated, consider ordering this additional test [id number]    nRBC 1.1 (H) 0.0 - 0.2 %    Comment: Performed at [facility], [address] [place], [place] [address]  Glucose, capillary     Status: Abnormal   Collection Time: 11/14/21  7:37 AM  Result Value Ref Range   Glucose-Capillary 102 (H) 70 - 99 mg/dL    Comment: Glucose reference range applies only to samples taken after fasting for at least 8 hours.  Glucose, capillary     Status: Abnormal   Collection Time: 11/14/21 11:57 AM  Result Value Ref Range   Glucose-Capillary 163 (H) 70 - 99 mg/dL    Comment: Glucose reference range applies only to samples taken after fasting for at least 8 hours.  Glucose, capillary     Status: Abnormal   Collection Time: 11/14/21  6:26 PM  Result Value Ref Range   Glucose-Capillary 141 (H) 70 - 99 mg/dL    Comment: Glucose reference range applies only to samples taken after fasting for at least 8 hours.  CBC with Differential/Platelet     Status: Abnormal    Collection Time: 11/18/21  9:51 AM  Result Value Ref Range   WBC 4.2 3.8 - 10.8 Thousand/uL   RBC 2.85 (L) 4.20 - 5.80 Million/uL   Hemoglobin 9.3 (L) 13.2 - 17.1 g/dL   HCT 29.0 (L) 38.5 - 50.0 %   MCV 101.8 (H) 80.0 - 100.0 fL   MCH 32.6 27.0 - 33.0 pg   MCHC 32.1 32.0 - 36.0 g/dL   RDW 22.8 (H) 11.0 - 15.0 %   Platelets 108 (L) 140 - 400 Thousand/uL   MPV 10.5 7.5 - 12.5 fL   Neutro Abs 2,843 1,500 - 7,800 cells/uL  Lymphs Abs 1,008 850 - 3,900 cells/uL   Absolute Monocytes 298 200 - 950 cells/uL   Eosinophils Absolute 0 (L) 15 - 500 cells/uL   Basophils Absolute 50 0 - 200 cells/uL   Neutrophils Relative % 67.7 %   Total Lymphocyte 24.0 %   Monocytes Relative 7.1 %   Eosinophils Relative 0.0 %   Basophils Relative 1.2 %  COMPLETE METABOLIC PANEL WITH GFR     Status: Abnormal   Collection Time: 11/18/21  9:51 AM  Result Value Ref Range   Glucose, Bld 66 65 - 99 mg/dL    Comment: .            Fasting reference interval .    BUN 100 (H) 7 - 25 mg/dL   Creat 6.47 (H) 0.70 - 1.30 mg/dL    Comment: Verified by repeat analysis. [name] Kitchen    eGFR 9 (L) > OR = 60 mL/min/1.40m   BUN/Creatinine Ratio 15 6 - 22 (calc)   Sodium 142 135 - 146 mmol/L   Potassium 3.9 3.5 - 5.3 mmol/L   Chloride 102 98 - 110 mmol/L   CO2 35 (H) 20 - 32 mmol/L   Calcium 8.6 8.6 - 10.3 mg/dL   Total Protein 6.0 (L) 6.1 - 8.1 g/dL   Albumin 2.7 (L) 3.6 - 5.1 g/dL   Globulin 3.3 1.9 - 3.7 g/dL (calc)   AG Ratio 0.8 (L) 1.0 - 2.5 (calc)   Total Bilirubin 1.8 (H) 0.2 - 1.2 mg/dL   Alkaline phosphatase (APISO) 235 (H) 35 - 144 U/L   AST 26 10 - 35 U/L   ALT 32 9 - 46 U/L  CBC MORPHOLOGY     Status: None   Collection Time: 11/18/21  9:51 AM  Result Value Ref Range   CBC MORPHOLOGY  NORMAL    Comment: Anisocytosis 2 + Macrocytosis 2 + Poikilocytosis 1 + Basophilic stippling 1 + Polychromasia 1 +   CBC     Status: Abnormal   Collection Time: 11/20/21  1:16 PM  Result Value Ref Range   WBC 4.3 4.0 -  10.5 K/uL   RBC 2.05 (L) 4.22 - 5.81 MIL/uL   Hemoglobin 6.6 (L) 13.0 - 17.0 g/dL   HCT 22.8 (L) 39.0 - 52.0 %   MCV 111.2 (H) 80.0 - 100.0 fL   MCH 32.2 26.0 - 34.0 pg   MCHC 28.9 (L) 30.0 - 36.0 g/dL   RDW 24.6 (H) 11.5 - 15.5 %   Platelets 106 (L) 150 - 400 K/uL   nRBC 0.0 0.0 - 0.2 %    Comment: [facility] [address] [place] [id number] Comprehensive metabolic panel     Status: Abnormal   Collection Time: 11/20/21  1:16 PM  Result Value Ref Range   Sodium 144 135 - 145 mmol/L   Potassium 4.0 3.5 - 5.1 mmol/L   Chloride 107 98 - 111 mmol/L   CO2 31 22 - 32 mmol/L   Glucose, Bld 78 70 - 99 mg/dL    Comment: Glucose reference range applies only to samples taken after fasting for at least 8 hours.   BUN 96 (H) 6 - 20 mg/dL   Creatinine, Ser 5.91 (H) 0.61 - 1.24 mg/dL   Calcium 8.3 (L) 8.9 - 10.3 mg/dL   Total Protein 5.8 (L) 6.5 - 8.1 g/dL   Albumin 2.2 (L) 3.5 - 5.0 g/dL   AST 41 15 - 41 U/L   ALT 30  0 - 44 U/L   Alkaline Phosphatase 194 (H) 38 - 126 U/L   Total Bilirubin 1.9 (H) 0.3 - 1.2 mg/dL   GFR, Estimated 11 (L) >60 mL/min    Comment: (NOTE) Calculated using the CKD-EPI Creatinine Equation (2021)    Anion gap 6 5 - 15    Comment: Performed at [facility], [address] [place], [place] [address]  Troponin I (High Sensitivity)     Status: Abnormal   Collection Time: 11/20/21  1:16 PM  Result Value Ref Range   Troponin I (High Sensitivity) 26 (H) <18 ng/L    Comment: (NOTE) Elevated high sensitivity troponin I (hsTnI) values and significant  changes across serial measurements may suggest ACS but many other  chronic and acute conditions are known to elevate hsTnI results.  Refer to the "Links" section for chest pain algorithms and additional  guidance. Performed at [facility], [place], [place] [address]   Troponin I (High Sensitivity)     Status: Abnormal   Collection Time: 11/20/21   3:13 PM  Result Value Ref Range   Troponin I (High Sensitivity) 26 (H) <18 ng/L    Comment: (NOTE) Elevated high sensitivity troponin I (hsTnI) values and significant  changes across serial measurements may suggest ACS but many other  chronic and acute conditions are known to elevate hsTnI results.  Refer to the "Links" section for chest pain algorithms and additional  guidance. Performed at [facility], [address] [place],  [address]      PHQ2/9:    06/18/2021    1:44 PM 03/03/2021    8:11 AM 07/19/2020   10:38 AM 01/04/2020    8:56 AM 06/27/2019    8:10 AM  Depression screen PHQ 2/9  Decreased Interest 0 0 0 0 0  Down, Depressed, Hopeless 0 0 0 0 0  PHQ - 2 Score 0 0 0 0 0  Altered sleeping 0 0 0 0   Tired, decreased energy 0 0 1 0   Change in appetite 0 0 0 0   Feeling bad or failure about yourself  0 0 0 0   Trouble concentrating 0 0 0 0   Moving slowly or fidgety/restless 0 0 0 0   Suicidal thoughts 0 0 0 0   PHQ-9 Score 0 0 1 0   Difficult doing work/chores Not difficult at all Not difficult at all Not difficult at all Not difficult at all       Fall Risk:    07/03/2021    3:08 PM 06/18/2021    1:43 PM 03/03/2021    8:11 AM 07/19/2020   10:38 AM 01/04/2020    8:56 AM  Fall Risk   Falls in the past year? 0 0 0 1 1  Number falls in past yr: 0 0 0 1 1  Injury with Fall? 0 0 0 0 0  Risk for fall due to : No Fall Risks History of fall(s) History of fall(s)  History of fall(s)  Follow up _0       Functional Status Survey:      Assessment & Plan

## 2021-11-26 ENCOUNTER — Other Ambulatory Visit: Payer: Self-pay | Admitting: Family Medicine

## 2021-11-26 NOTE — Telephone Encounter (Signed)
Requested medication (s) are due for refill today: For review  Requested medication (s) are on the active medication list: yes    Last refill: 07/08/2018  Amount not specified  Future visit scheduled yes 12/01/21  Notes to clinic:Historical Provider, please review. Thank you.  Requested Prescriptions  Pending Prescriptions Disp Refills   TRULICITY 8.11 SR/1.5XY SOPN [Pharmacy Med Name: Trulicity Subcutaneous Solution Pen-injector 0.75 MG/0.5ML] 8 mL 0    Sig: INJECT 0.75MG  (0.5ML) UNDER THE SKIN ONCE A WEEK     Endocrinology:  Diabetes - GLP-1 Receptor Agonists Failed - 11/26/2021  8:09 AM      Failed - HBA1C is between 0 and 7.9 and within 180 days    HB A1C (BAYER DCA - WAIVED)  Date Value Ref Range Status  08/07/2014 9.2 (H) <7.0 % Final    Comment:                                          Diabetic Adult            <7.0                                       Healthy Adult        4.3 - 5.7                                                           (DCCT/NGSP) American Diabetes Association's Summary of Glycemic Recommendations for Adults with Diabetes: Hemoglobin A1c <7.0%. More stringent glycemic goals (A1c <6.0%) may further reduce complications at the cost of increased risk of hypoglycemia.    Hgb A1c MFr Bld  Date Value Ref Range Status  05/29/2021 5.1 <5.7 % of total Hgb Final    Comment:    For the purpose of screening for the presence of diabetes: . <5.7%       Consistent with the absence of diabetes 5.7-6.4%    Consistent with increased risk for diabetes             (prediabetes) > or =6.5%  Consistent with diabetes . This assay result is consistent with a decreased risk of diabetes. . Currently, no consensus exists regarding use of hemoglobin A1c for diagnosis of diabetes in children. . According to American Diabetes Association (ADA) guidelines, hemoglobin A1c <7.0% represents optimal control in non-pregnant diabetic patients. Different metrics may apply to  specific patient populations.  Standards of Medical Care in Diabetes(ADA). Renella Cunas - Valid encounter within last 6 months    Recent Outpatient Visits           2 days ago Laceration of left lower extremity, subsequent encounter   [place], Dani Gobble, PA-C   1 week ago Acute renal failure superimposed on stage 4 chronic kidney disease, unspecified acute renal failure type [facility])   [place], DO   2 weeks ago Diarrhea, unspecified type   [place], DO   5 months ago Annual physical exam   [place]  [place], DO   8 months ago Controlled type 2 diabetes mellitus with stage 4 chronic kidney disease, with long-term current use of insulin ([place])   [place], DO       Future Appointments             In 5 days [place], [name], DO [facility]

## 2021-11-27 ENCOUNTER — Emergency Department
Admission: EM | Admit: 2021-11-27 | Discharge: 2021-11-27 | Disposition: A | Discharge status: Home / Self Care | Payer: PPO | Attending: Emergency Medicine | Admitting: Emergency Medicine

## 2021-11-27 ENCOUNTER — Other Ambulatory Visit: Payer: Self-pay

## 2021-11-27 DIAGNOSIS — R0902 Hypoxemia: Secondary | ICD-10-CM | POA: Diagnosis not present

## 2021-11-27 DIAGNOSIS — R410 Disorientation, unspecified: Secondary | ICD-10-CM | POA: Diagnosis present

## 2021-11-27 DIAGNOSIS — I509 Heart failure, unspecified: Secondary | ICD-10-CM | POA: Diagnosis not present

## 2021-11-27 DIAGNOSIS — E1122 Type 2 diabetes mellitus with diabetic chronic kidney disease: Secondary | ICD-10-CM | POA: Diagnosis not present

## 2021-11-27 DIAGNOSIS — E161 Other hypoglycemia: Secondary | ICD-10-CM | POA: Diagnosis not present

## 2021-11-27 DIAGNOSIS — Z794 Long term (current) use of insulin: Secondary | ICD-10-CM

## 2021-11-27 DIAGNOSIS — E11641 Type 2 diabetes mellitus with hypoglycemia with coma: Secondary | ICD-10-CM | POA: Diagnosis not present

## 2021-11-27 DIAGNOSIS — N185 Chronic kidney disease, stage 5: Secondary | ICD-10-CM

## 2021-11-27 DIAGNOSIS — E11649 Type 2 diabetes mellitus with hypoglycemia without coma: Secondary | ICD-10-CM | POA: Diagnosis not present

## 2021-11-27 DIAGNOSIS — I5022 Chronic systolic (congestive) heart failure: Secondary | ICD-10-CM | POA: Diagnosis not present

## 2021-11-27 DIAGNOSIS — I130 Hypertensive heart and chronic kidney disease with heart failure and stage 1 through stage 4 chronic kidney disease, or unspecified chronic kidney disease: Secondary | ICD-10-CM | POA: Diagnosis not present

## 2021-11-27 DIAGNOSIS — E162 Hypoglycemia, unspecified: Secondary | ICD-10-CM | POA: Diagnosis not present

## 2021-11-27 LAB — CBG MONITORING, ED
Glucose-Capillary: 87 mg/dL (ref 70–99)
Glucose-Capillary: 82 mg/dL (ref 70–99)

## 2021-11-27 NOTE — ED Triage Notes (Signed)
BIB [facility] from home for hypoglycemia. Pt BGL found to be 41 a t home. EMS started a line and administered D10.   Pt is CAO but having a hard time staying awake at this time for ER staff.

## 2021-11-27 NOTE — ED Provider Notes (Signed)
[facility] Provider Note    Event Date/Time   First MD Initiated Contact with Patient 11/27/21 1308     (approximate)   History   Chief Complaint: Hypoglycemia   HPI  [name] is a 56 y.o. male with a history of diabetes, CKD, GERD, heart failure, obesity who was brought to the ED today due to low blood sugar.  Patient was found at home, confused, had a blood sugar of 41 by EMS.  They gave some D10 IV, with blood sugar improving to 100 and ental status normalizing.  Patient reports that he had not eaten breakfast yet this morning, and when he felt like his blood sugar was getting low he tried drinking some orange juice but continued to feel sick.  He denies any pain or fever, no vomiting or diarrhea.  Is been taking all of his medications as usual including his insulin.  No extra insulin use.  He does report sometimes his blood sugar gets low but low in the morning before he eats and he had not eaten yet today.     Physical Exam   Triage Vital Signs: ED Triage Vitals [11/27/21 1307]  Enc Vitals Group     BP 118/79     Pulse Rate (!) 57     Resp 16     Temp 98 F (36.7 C)     Temp Source Oral     SpO2 95 %     Weight (!) 363 lb 15.7 oz (165.1 kg)     Height 5\' 11"  (1.803 m)     Head Circumference      Peak Flow      Pain Score 0     Pain Loc      Pain Edu?      Excl. in [place]?     Most recent vital signs: Vitals:   11/27/21 1307 11/27/21 1345  BP: 118/79 107/65  Pulse: (!) 57 71  Resp: 16 12  Temp: 98 F (36.7 C)   SpO2: 95% 100%    General: Awake, no distress.  CV:  Good peripheral perfusion.  Resp:  Normal effort.  Clear to auscultation bilaterally Abd:  No distention.  Soft nontender Other:  Moist oral mucosa.  No wounds.  Ambulatory.   ED Results / Procedures / Treatments   Labs (all labs ordered are listed, but only abnormal results are displayed) Labs Reviewed  BASIC METABOLIC PANEL  URINALYSIS, ROUTINE W REFLEX  MICROSCOPIC  CBG MONITORING, ED  CBG MONITORING, ED     EKG Interpreted by me Sinus rhythm rate of 68.  Right axis, left bundle branch block.  Poor R wave progression.  No acute ischemic changes.  Diffuse low voltage, likely due to body habitus from morbid obesity   RADIOLOGY    PROCEDURES:  Procedures   MEDICATIONS ORDERED IN ED: Medications - No data to display   IMPRESSION / MDM / [place] / ED COURSE  I reviewed the triage vital signs and the nursing notes.                              Differential diagnosis includes, but is not limited to, inadequate oral intake on insulin, AKI, anemia, electrolyte abnormality, UTI.  Doubt overdiuresis or intentional self-harm.  Not septic.  Doubt stroke.  Patient's presentation is most consistent with acute presentation with potential threat to life or bodily function.  Patient  brought to the ED with hypoglycemia causing altered mental status.  On arrival his mental status is normal and his blood sugar is normal.  Remained stable across multiple rechecks with a blood sugar of about 80.  Patient ambulatory, asymptomatic.  At 2:05 PM, patient reported that she wanted to be discharged immediately, felt fine and did not want to stay in the ED waiting for results or further evaluation.  I think this is reasonable and he can follow-up outpatient.  Return precautions discussed.      FINAL CLINICAL IMPRESSION(S) / ED DIAGNOSES   Final diagnoses:  Type 2 diabetes mellitus with hypoglycemia and coma, with long-term current use of insulin (HCC)  Chronic congestive heart failure, unspecified heart failure type ([place])  Morbid obesity (HCC)  Stage 5 chronic kidney disease not on chronic dialysis ([place])     Rx / DC Orders   ED Discharge Orders     None        Note:  This document was prepared using Dragon voice recognition software and may include unintentional dictation errors.   Carrie Mew, MD 11/27/21 1409

## 2021-11-27 NOTE — ED Notes (Signed)
Pt ambulated to toilet to urinate. Pt ate all of his [place] sandwich.

## 2021-11-27 NOTE — ED Notes (Signed)
BGL 88

## 2021-11-27 NOTE — ED Notes (Signed)
Pt given sandwich tray 

## 2021-11-27 NOTE — ED Notes (Signed)
Pt is very hard to arouse. Pt is arousable with loud verbal stimulation.

## 2021-11-27 NOTE — ED Notes (Signed)
Pt states he is ready to leave. Refused to sign DC. Pt IV removed and DC to waiting room.

## 2021-12-01 ENCOUNTER — Encounter: Payer: Self-pay | Admitting: Family Medicine

## 2021-12-01 ENCOUNTER — Ambulatory Visit (INDEPENDENT_AMBULATORY_CARE_PROVIDER_SITE_OTHER): Payer: PPO | Admitting: Family Medicine

## 2021-12-01 VITALS — BP 127/73 | HR 73 | Ht 71.0 in | Wt 366.0 lb

## 2021-12-01 DIAGNOSIS — Z6841 Body Mass Index (BMI) 40.0 and over, adult: Secondary | ICD-10-CM | POA: Diagnosis not present

## 2021-12-01 DIAGNOSIS — D649 Anemia, unspecified: Secondary | ICD-10-CM | POA: Diagnosis not present

## 2021-12-01 DIAGNOSIS — G894 Chronic pain syndrome: Secondary | ICD-10-CM

## 2021-12-01 DIAGNOSIS — E11649 Type 2 diabetes mellitus with hypoglycemia without coma: Secondary | ICD-10-CM | POA: Diagnosis not present

## 2021-12-01 DIAGNOSIS — N179 Acute kidney failure, unspecified: Secondary | ICD-10-CM

## 2021-12-01 DIAGNOSIS — I5022 Chronic systolic (congestive) heart failure: Secondary | ICD-10-CM | POA: Diagnosis not present

## 2021-12-01 DIAGNOSIS — Z794 Long term (current) use of insulin: Secondary | ICD-10-CM

## 2021-12-01 DIAGNOSIS — N184 Chronic kidney disease, stage 4 (severe): Secondary | ICD-10-CM

## 2021-12-01 DIAGNOSIS — I129 Hypertensive chronic kidney disease with stage 1 through stage 4 chronic kidney disease, or unspecified chronic kidney disease: Secondary | ICD-10-CM | POA: Diagnosis not present

## 2021-12-01 DIAGNOSIS — E1122 Type 2 diabetes mellitus with diabetic chronic kidney disease: Secondary | ICD-10-CM | POA: Diagnosis not present

## 2021-12-01 DIAGNOSIS — D631 Anemia in chronic kidney disease: Secondary | ICD-10-CM | POA: Diagnosis not present

## 2021-12-01 LAB — BASIC METABOLIC PANEL WITH GFR
BUN/Creatinine Ratio: 13 (calc) (ref 6–22)
BUN: 68 mg/dL — ABNORMAL HIGH (ref 7–25)
CO2: 28 mmol/L (ref 20–32)
Calcium: 8.2 mg/dL — ABNORMAL LOW (ref 8.6–10.3)
Chloride: 104 mmol/L (ref 98–110)
Creat: 5.05 mg/dL — ABNORMAL HIGH (ref 0.70–1.30)
Glucose, Bld: 80 mg/dL (ref 65–99)
Potassium: 4.6 mmol/L (ref 3.5–5.3)
Sodium: 139 mmol/L (ref 135–146)
eGFR: 13 mL/min/{1.73_m2} — ABNORMAL LOW (ref >=60)

## 2021-12-01 LAB — CBC WITH DIFFERENTIAL/PLATELET
Absolute Monocytes: 428 cells/uL (ref 200–950)
Basophils Absolute: 18 cells/uL (ref 0–200)
Basophils Relative: 0.4 %
Eosinophils Absolute: 81 cells/uL (ref 15–500)
Eosinophils Relative: 1.8 %
HCT: 24.3 % — ABNORMAL LOW (ref 38.5–50.0)
Hemoglobin: 7.7 g/dL — ABNORMAL LOW (ref 13.2–17.1)
Lymphs Abs: 1157 cells/uL (ref 850–3900)
MCH: 34.8 pg — ABNORMAL HIGH (ref 27.0–33.0)
MCHC: 31.7 g/dL — ABNORMAL LOW (ref 32.0–36.0)
MCV: 110 fL — ABNORMAL HIGH (ref 80.0–100.0)
MPV: 9.1 fL (ref 7.5–12.5)
Monocytes Relative: 9.5 %
Neutro Abs: 2817 cells/uL (ref 1500–7800)
Neutrophils Relative %: 62.6 %
Platelets: 213 10*3/uL (ref 140–400)
RBC: 2.21 10*6/uL — ABNORMAL LOW (ref 4.20–5.80)
RDW: 19.2 % — ABNORMAL HIGH (ref 11.0–15.0)
Total Lymphocyte: 25.7 %
WBC: 4.5 10*3/uL (ref 3.8–10.8)

## 2021-12-01 NOTE — Progress Notes (Signed)
Subjective:    Patient ID: [name], male    DOB: 04-03-1965, 56 y.o.   MRN: [id number]  [name] is a 56 y.o. male presenting on 12/01/2021 for Follow-up and Leg Injury   HPI  ED Follow up Left lower extremity large laceration Sutures remain in place, see ED note He plans to return to ED for complicated suture removal, had issue with bleeding requiring additional sutures  T2DM Hypoglycemia ED Followup 11/27/21 [facility] ED He said quickly developed low sugar, and he got disoriented, had not had breakfast yet, but took insulin. Taking Trulicity 8.54OE weekly Now takes Insulin Basaglar 10 units, he admits recurrence with hypoglycemia and feeling disoriented and dizzy on this.  Chronic Systolic Heart Failure Followed by [facility]  CKD IV Creatinine 5.91 (11/2021) prior 6 to 8 range eGFR last at 11 (11/2021) He has declined hemodialysis recently in hospital Has upcoming apt 11/9 with Dr Candiss [facility] Reduced  urine output but still will urinate and void several times  Anemia, AoCKD Symptomatic, fatigue On Ferrous gluconate daily with meal, and goal to take 2nd per day.  Generalized Pain Hx Gout Takes Tramadol AS NEEDED but has delayed reaction of feeling sleepy       11/24/2021   11:02 AM 06/18/2021    1:44 PM 03/03/2021    8:11 AM  Depression screen PHQ 2/9  Decreased Interest 0 0 0  Down, Depressed, Hopeless 0 0 0  PHQ - 2 Score 0 0 0  Altered sleeping 0 0 0  Tired, decreased energy 0 0 0  Change in appetite 0 0 0  Feeling bad or failure about yourself  0 0 0  Trouble concentrating 0 0 0  Moving slowly or fidgety/restless 0 0 0  Suicidal thoughts 0 0 0  PHQ-9 Score 0 0 0  Difficult doing work/chores Not difficult at all Not difficult at all Not difficult at all    Social History   Tobacco Use   Smoking status: Never   Smokeless tobacco: Never  Substance Use Topics   Alcohol use: Yes    Comment: a glass of wine and a couple beers a  day   Drug use: No    Review of Systems Per HPI unless specifically indicated above     Objective:    BP 127/73   Pulse 73   Ht 5' 11" (1.803 m)   Wt (!) 366 lb (166 kg)   SpO2 95%   BMI 51.05 kg/m   Wt Readings from Last 3 Encounters:  12/01/21 (!) 366 lb (166 kg)  11/27/21 (!) 363 lb 15.7 oz (165.1 kg)  11/24/21 (!) 364 lb (165.1 kg)    Physical Exam Vitals and nursing note reviewed.  Constitutional:      General: He is not in acute distress.    Appearance: He is well-developed. He is obese. He is not diaphoretic.     Comments: Well-appearing, comfortable, cooperative  HENT:     Head: Normocephalic and atraumatic.  Eyes:     General:        Right eye: No discharge.        Left eye: No discharge.     Conjunctiva/sclera: Conjunctivae normal.  Neck:     Thyroid: No thyromegaly.  Cardiovascular:     Rate and Rhythm: Normal rate and regular rhythm.     Pulses: Normal pulses.     Heart sounds: Normal heart sounds. No murmur heard. Pulmonary:  Effort: Pulmonary effort is normal. No respiratory distress.     Breath sounds: Normal breath sounds. No wheezing or rales.  Musculoskeletal:        General: Normal range of motion.     Cervical back: Normal range of motion and neck supple.     Right lower leg: Edema present.     Left lower leg: Edema present.  Lymphadenopathy:     Cervical: No cervical adenopathy.  Skin:    General: Skin is warm and dry.     Findings: Lesion (Left lower extremity anterior leg with large triangular laceration deeper wound repaired by suture, removed non adherent pad and wrap, appears to be healing, but complex sutures in place, issue with oozing bleeding) present. No erythema or rash.  Neurological:     Mental Status: He is alert and oriented to person, place, and time. Mental status is at baseline.  Psychiatric:        Behavior: Behavior normal.     Comments: Well groomed, good eye contact, normal speech and thoughts    Results for  orders placed or performed in visit on 12/01/21  CBC with Differential/Platelet  Result Value Ref Range   WBC 4.5 3.8 - 10.8 Thousand/uL   RBC 2.21 (L) 4.20 - 5.80 Million/uL   Hemoglobin 7.7 (L) 13.2 - 17.1 g/dL   HCT 24.3 (L) 38.5 - 50.0 %   MCV 110.0 (H) 80.0 - 100.0 fL   MCH 34.8 (H) 27.0 - 33.0 pg   MCHC 31.7 (L) 32.0 - 36.0 g/dL   RDW 19.2 (H) 11.0 - 15.0 %   Platelets 213 140 - 400 Thousand/uL   MPV 9.1 7.5 - 12.5 fL   Neutro Abs 2,817 1,500 - 7,800 cells/uL   Lymphs Abs 1,157 850 - 3,900 cells/uL   Absolute Monocytes 428 200 - 950 cells/uL   Eosinophils Absolute 81 15 - 500 cells/uL   Basophils Absolute 18 0 - 200 cells/uL   Neutrophils Relative % 62.6 %   Total Lymphocyte 25.7 %   Monocytes Relative 9.5 %   Eosinophils Relative 1.8 %   Basophils Relative 0.4 %  BASIC METABOLIC PANEL WITH GFR  Result Value Ref Range   Glucose, Bld 80 65 - 99 mg/dL   BUN 68 (H) 7 - 25 mg/dL   Creat 5.05 (H) 0.70 - 1.30 mg/dL   eGFR 13 (L) > OR = 60 mL/min/1.73m2   BUN/Creatinine Ratio 13 6 - 22 (calc)   Sodium 139 135 - 146 mmol/L   Potassium 4.6 3.5 - 5.3 mmol/L   Chloride 104 98 - 110 mmol/L   CO2 28 20 - 32 mmol/L   Calcium 8.2 (L) 8.6 - 10.3 mg/dL      Assessment & Plan:   Problem List Items Addressed This Visit     Benign hypertension with CKD (chronic kidney disease) stage IV (HCC) - Primary   Relevant Orders   CBC with Differential/Platelet (Completed)   BASIC METABOLIC PANEL WITH GFR (Completed)   Chronic pain syndrome   Chronic systolic heart failure (HCC)   Morbid obesity with BMI of 50.0-59.9, adult (HCC)   Type 2 diabetes mellitus, controlled, with renal complications (HCC)   Other Visit Diagnoses     Acute renal failure superimposed on stage 4 chronic kidney disease, unspecified acute renal failure type (HCC)       Relevant Orders   CBC with Differential/Platelet (Completed)   BASIC METABOLIC PANEL WITH GFR (Completed)   Hypoglycemia associated with diabetes  (  [place])       Anemia due to stage 4 chronic kidney disease ([place])       Relevant Orders   Ambulatory referral to Hematology / Oncology   Symptomatic anemia       Relevant Orders   Ambulatory referral to Hematology / Oncology       Symptomatic Anemia Likely with CKD advanced to CKD IV to V Hgb 6.6 1 week ago Due for repeat today STAT labs Show Hgb 7.7 improved but HCT 24.3, still very low He has symptoms of anemia Referral to Hematologist will warrant iron infusion  [place] [facility] (Hematology/Oncology) [address] [place], [place] [address] Phone: [phone]  -------------  AoCKD IV to V Previously declined hemodialysis Keep apt with Kidney specialist.  Keep on the iron supplement twice a day with meal.  Due to decline in kidney function Advised him to DC Basdaglar insulin completely with hypoglycemia events STOP taking Basaglar Insulin He may keep Trulicity 2.99BZ weekly at this time, A1c previously in 5 range  Sutures placed 10/26 - advised he should return to ED where they were placed for supervised removal of sutures to avoid bleeding complication -  can remove anytime this week or next week.   Orders Placed This Encounter  Procedures   CBC with Differential/Platelet   BASIC METABOLIC PANEL WITH GFR   Ambulatory referral to Hematology / Oncology    Referral Priority:   Emergency    Referral Type:   Consultation    Referral Reason:   Specialty Services Required    Requested Specialty:   Oncology    Number of Visits Requested:   1     No orders of the defined types were placed in this encounter.     Follow up plan: Return if symptoms worsen or fail to improve.   [name], DO [facility] 12/01/2021, 8:45 AM

## 2021-12-01 NOTE — Patient Instructions (Addendum)
Thank you for coming to the office today.  Referral to [place] [facility] (Hematology/Oncology) [address] [place], [place] [address] Phone: [phone]  -------------  Keep apt with Kidney specialist.  Keep on the iron supplement twice a day with meal.  STOP taking Basaglar Insulin.  Sutures placed 10/26 - they can remove anytime this week or next week.   Please schedule a Follow-up Appointment to: Return if symptoms worsen or fail to improve.  If you have any other questions or concerns, please feel free to call the office or send a message through [place]. You may also schedule an earlier appointment if necessary.  Additionally, you may be receiving a survey about your experience at our office within a few days to 1 week by e-mail or mail. We value your feedback.  [name], DO [place]

## 2021-12-04 DIAGNOSIS — N2581 Secondary hyperparathyroidism of renal origin: Secondary | ICD-10-CM | POA: Diagnosis not present

## 2021-12-04 DIAGNOSIS — R809 Proteinuria, unspecified: Secondary | ICD-10-CM | POA: Diagnosis not present

## 2021-12-04 DIAGNOSIS — N185 Chronic kidney disease, stage 5: Secondary | ICD-10-CM | POA: Diagnosis not present

## 2021-12-04 DIAGNOSIS — I10 Essential (primary) hypertension: Secondary | ICD-10-CM | POA: Diagnosis not present

## 2021-12-04 DIAGNOSIS — E1122 Type 2 diabetes mellitus with diabetic chronic kidney disease: Secondary | ICD-10-CM | POA: Diagnosis not present

## 2021-12-07 ENCOUNTER — Other Ambulatory Visit: Payer: Self-pay

## 2021-12-07 ENCOUNTER — Emergency Department
Admission: EM | Admit: 2021-12-07 | Discharge: 2021-12-07 | Disposition: A | Discharge status: Home / Self Care | Payer: PPO | Attending: Emergency Medicine | Admitting: Emergency Medicine

## 2021-12-07 DIAGNOSIS — X58XXXA Exposure to other specified factors, initial encounter: Secondary | ICD-10-CM | POA: Insufficient documentation

## 2021-12-07 DIAGNOSIS — I509 Heart failure, unspecified: Secondary | ICD-10-CM | POA: Insufficient documentation

## 2021-12-07 DIAGNOSIS — S8012XA Contusion of left lower leg, initial encounter: Secondary | ICD-10-CM | POA: Insufficient documentation

## 2021-12-07 DIAGNOSIS — S81802A Unspecified open wound, left lower leg, initial encounter: Secondary | ICD-10-CM | POA: Diagnosis not present

## 2021-12-07 DIAGNOSIS — S81812D Laceration without foreign body, left lower leg, subsequent encounter: Secondary | ICD-10-CM | POA: Diagnosis not present

## 2021-12-07 DIAGNOSIS — E119 Type 2 diabetes mellitus without complications: Secondary | ICD-10-CM | POA: Insufficient documentation

## 2021-12-07 DIAGNOSIS — T148XXA Other injury of unspecified body region, initial encounter: Secondary | ICD-10-CM

## 2021-12-07 DIAGNOSIS — S80922A Unspecified superficial injury of left lower leg, initial encounter: Secondary | ICD-10-CM | POA: Diagnosis present

## 2021-12-07 LAB — CBC WITH DIFFERENTIAL/PLATELET
Abs Immature Granulocytes: 0.01 10*3/uL (ref 0.00–0.07)
Basophils Absolute: 0 10*3/uL (ref 0.0–0.1)
Basophils Relative: 1 %
Eosinophils Absolute: 0.1 10*3/uL (ref 0.0–0.5)
Eosinophils Relative: 2 %
HCT: 30.6 % — ABNORMAL LOW (ref 39.0–52.0)
Hemoglobin: 9.1 g/dL — ABNORMAL LOW (ref 13.0–17.0)
Immature Granulocytes: 0 %
Lymphocytes Relative: 24 %
Lymphs Abs: 1.1 10*3/uL (ref 0.7–4.0)
MCH: 33.6 pg (ref 26.0–34.0)
MCHC: 29.7 g/dL — ABNORMAL LOW (ref 30.0–36.0)
MCV: 112.9 fL — ABNORMAL HIGH (ref 80.0–100.0)
Monocytes Absolute: 0.5 10*3/uL (ref 0.1–1.0)
Monocytes Relative: 10 %
Neutro Abs: 3 10*3/uL (ref 1.7–7.7)
Neutrophils Relative %: 63 %
Platelets: 196 10*3/uL (ref 150–400)
RBC: 2.71 MIL/uL — ABNORMAL LOW (ref 4.22–5.81)
RDW: 21.7 % — ABNORMAL HIGH (ref 11.5–15.5)
Smear Review: NORMAL
WBC: 4.7 10*3/uL (ref 4.0–10.5)
nRBC: 0.6 % — ABNORMAL HIGH (ref 0.0–0.2)

## 2021-12-07 LAB — BASIC METABOLIC PANEL
Anion gap: 4 — ABNORMAL LOW (ref 5–15)
BUN: 50 mg/dL — ABNORMAL HIGH (ref 6–20)
CO2: 26 mmol/L (ref 22–32)
Calcium: 8.8 mg/dL — ABNORMAL LOW (ref 8.9–10.3)
Chloride: 110 mmol/L (ref 98–111)
Creatinine, Ser: 4.44 mg/dL — ABNORMAL HIGH (ref 0.61–1.24)
GFR, Estimated: 15 mL/min — ABNORMAL LOW (ref >60)
Glucose, Bld: 84 mg/dL (ref 70–99)
Potassium: 4.3 mmol/L (ref 3.5–5.1)
Sodium: 140 mmol/L (ref 135–145)

## 2021-12-07 LAB — LACTIC ACID, PLASMA: Lactic Acid, Venous: 1 mmol/L (ref 0.5–1.9)

## 2021-12-07 LAB — TYPE AND SCREEN
ABO/RH(D): A POS
Antibody Screen: NEGATIVE

## 2021-12-07 MED ORDER — DEXTROSE 5 % IV SOLN
1125.0000 mg | Freq: Once | INTRAVENOUS | Status: AC
  Administered 2021-12-07: 1125 mg via INTRAVENOUS
  Filled 2021-12-07: qty 56.25

## 2021-12-07 MED ORDER — AMOXICILLIN-POT CLAVULANATE 500-125 MG PO TABS: 1.0000 | ORAL_TABLET | Freq: Two times a day (BID) | ORAL | 0 refills | Status: AC
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
## 2021-12-07 NOTE — Discharge Instructions (Signed)
Your wound is VERY high risk for worsening without appropriate care  Leave the dressing and packing in place for 48 hours then follow-up with either the ER or Dr. Hampton Abbot in clinic, for repeat exam.   You will likely need REPEAT wound care and check-ups to make sure this heals to prevent losing your leg  Take the NEW antibiotic as prescribed NOT the Keflex you had been prescribed

## 2021-12-07 NOTE — ED Notes (Signed)
Dr. Ellender Hose at bedside. Patient is agitated about staying for IV antibiotics.
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
## 2021-12-08 ENCOUNTER — Ambulatory Visit: Payer: PPO | Admitting: Family Medicine

## 2021-12-08 ENCOUNTER — Inpatient Hospital Stay: Payer: PPO | Admitting: Oncology

## 2021-12-08 ENCOUNTER — Inpatient Hospital Stay: Payer: PPO

## 2021-12-09 ENCOUNTER — Ambulatory Visit (INDEPENDENT_AMBULATORY_CARE_PROVIDER_SITE_OTHER): Payer: PPO | Admitting: Family Medicine

## 2021-12-09 ENCOUNTER — Encounter: Payer: Self-pay | Admitting: Family Medicine

## 2021-12-09 VITALS — BP 136/86 | HR 84 | Ht 71.0 in | Wt 337.0 lb

## 2021-12-09 DIAGNOSIS — E1122 Type 2 diabetes mellitus with diabetic chronic kidney disease: Secondary | ICD-10-CM

## 2021-12-09 DIAGNOSIS — D631 Anemia in chronic kidney disease: Secondary | ICD-10-CM

## 2021-12-09 DIAGNOSIS — N184 Chronic kidney disease, stage 4 (severe): Secondary | ICD-10-CM

## 2021-12-09 DIAGNOSIS — S81802A Unspecified open wound, left lower leg, initial encounter: Secondary | ICD-10-CM

## 2021-12-09 DIAGNOSIS — I5022 Chronic systolic (congestive) heart failure: Secondary | ICD-10-CM

## 2021-12-09 DIAGNOSIS — Z794 Long term (current) use of insulin: Secondary | ICD-10-CM

## 2021-12-09 DIAGNOSIS — I129 Hypertensive chronic kidney disease with stage 1 through stage 4 chronic kidney disease, or unspecified chronic kidney disease: Secondary | ICD-10-CM | POA: Diagnosis not present

## 2021-12-09 DIAGNOSIS — E11649 Type 2 diabetes mellitus with hypoglycemia without coma: Secondary | ICD-10-CM

## 2021-12-09 DIAGNOSIS — Z6841 Body Mass Index (BMI) 40.0 and over, adult: Secondary | ICD-10-CM | POA: Diagnosis not present
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
## 2021-12-09 NOTE — Patient Instructions (Addendum)
   Please schedule a Follow-up Appointment to: Return if symptoms worsen or fail to improve.  If you have any other questions or concerns, please feel free to call the office or send a message through MyChart. You may also schedule an earlier appointment if necessary.  Additionally, you may be receiving a survey about your experience at our office within a few days to 1 week by e-mail or mail. We value your feedback.  Meleny Tregoning, DO [facility], [facility] 

## 2021-12-10 ENCOUNTER — Inpatient Hospital Stay: Payer: PPO | Admitting: Surgery

## 2021-12-10 ENCOUNTER — Ambulatory Visit: Payer: PPO | Admitting: Pharmacist

## 2021-12-10 DIAGNOSIS — E1122 Type 2 diabetes mellitus with diabetic chronic kidney disease: Secondary | ICD-10-CM

## 2021-12-10 NOTE — Chronic Care Management (AMB) (Signed)
Care Management   Pharmacy Note  12/10/2021 Name: ZADEN SAKO MRN: [id number] DOB: 1965-09-07  Subjective: OMARE BILOTTA is a 56 y.o. year old male who is a primary care patient of [name], DO. The Care Management team was consulted for assistance with care management and care coordination needs.    Engaged with patient by telephone for follow up visit in response to provider referral for pharmacy case management and/or care coordination services.    SDOH (Social Determinants of Health) assessments and interventions performed:    Objective:  Lab Results  Component Value Date   CREATININE 4.44 (H) 12/07/2021   CREATININE 5.05 (H) 12/01/2021   CREATININE 5.91 (H) 11/20/2021    Lab Results  Component Value Date   HGBA1C 5.1 05/29/2021    BP Readings from Last 3 Encounters:  12/09/21 136/86  12/07/21 (!) 131/92  12/01/21 127/73    Care Plan  Allergies  Allergen Reactions   Talc     Medications Reviewed Today     Reviewed by Rennis Petty, RPH-CPP (Pharmacist) on 12/10/21 at 0844  Med List Status: <None>   Medication Order Taking? Sig Documenting Provider Last Dose Status Informant  albuterol (PROVENTIL) (2.5 MG/3ML) 0.083% nebulizer solution [id number]  Take 3 mLs (2.5 mg total) by nebulization every 6 (six) hours as needed for wheezing or shortness of breath. [name], [name], DO  Active   albuterol (VENTOLIN HFA) 108 (90 Base) MCG/ACT inhaler [id number]  Inhale 2 puffs into the lungs every 4 (four) hours as needed for wheezing or shortness of breath. [name], DO  Active   allopurinol (ZYLOPRIM) 100 MG tablet [id number]  Take 2 tablets (200 mg total) by mouth daily. [name], DO  Active   amoxicillin-clavulanate (AUGMENTIN) 500-125 MG tablet [id number] Yes Take 1 tablet by mouth in the morning and at bedtime for 7 days. Duffy Bruce, MD Taking Active   atorvastatin (LIPITOR) 20 MG tablet [id number]  Yes Take 1 tablet (20 mg total) by mouth daily. [name], DO Taking Active   blood glucose meter kit and supplies KIT [id number]  Brand of choice; LON 99 months; check FSBS 3x a day; E11.65; disp one meter with 100 strips + 1 refill of strips [name], [name], MD  Active Self  Blood Glucose Monitoring Suppl (ONE TOUCH ULTRA 2) w/Device KIT [id number]  Use to check blood glucose up to 3 x daily [name], DO  Active   bumetanide (BUMEX) 1 MG tablet [id number] Yes Take by mouth. Take 2 tablets (2 mg total) by mouth in the morning and 2 tablets (2 mg total) at noon and 2 tablets (2 mg total) in the evening. [provider] Taking Active   calcitRIOL (ROCALTROL) 0.5 MCG capsule [id number]  Take 2 capsules (1 mcg total) by mouth 1 (one) time each day  Patient not taking: Reported on 11/11/2021   [provider]  Active   carvedilol (COREG) 25 MG tablet [id number]  Take 2 tablets (50 mg total) by mouth 2 (two) times daily with a meal. Parks Ranger, [name], DO  Active   Dulaglutide (TRULICITY) 6.28 BT/5.1VO [place] [facility] [id number] Yes INJECT 0.75MG (0.5ML) UNDER THE SKIN ONCE A WEEK [name], [name], DO Taking Active   esomeprazole (NEXIUM) 40 MG capsule [id number]  Take 1 capsule (40 mg total) by mouth 2 (two) times daily before a meal. [name], DO  Active   ferrous gluconate (FERGON) 324 MG tablet [id number] Yes Take  1 tablet (324 mg total) by mouth daily with breakfast. Nena Polio, MD Taking Active   fluticasone [facility]) 50 MCG/ACT nasal spray [id number]  Place 2 sprays into both nostrils daily. Use for 4-6 weeks then stop and use seasonally or as needed. [name], [name], DO  Active   metolazone (ZAROXOLYN) 5 MG tablet [id number]  Take 1 tablet (5 mg total) by mouth 1 (one) time each day if needed (for weight gain) [provider]  Active   OneTouch Delica Lancets 66Q MISC [id number]  Use to check blood sugar 3 x daily  [name], DO  Active   [facility] ULTRA test strip [id number]  Check blood sugar 3 x daily [name], DO  Active   potassium chloride (KLOR-CON) 10 MEQ tablet [id number]  Take 10 mEq by mouth daily. [provider]  Active   simethicone (MYLICON) 944 MG chewable tablet [id number]  Chew 125 mg by mouth every 6 (six) hours as needed for flatulence. [provider]  Active   traMADol (ULTRAM) 50 MG tablet [id number]  Take 1-2 tablets (50-100 mg total) by mouth every 12 (twelve) hours as needed. [name], DO  Active              Care Plan : PharmD - Med Assistance  Updates made by Rennis Petty, RPH-CPP since 12/10/2021 12:00 AM     Problem: Disease Progression      Long-Range Goal: Disease Progression Prevented or Minimized   Start Date: 04/03/2020  Expected End Date: 07/02/2020  Recent Progress: On track  Priority: High  Note:   Current Barriers:  Financial Barriers  Patient APPROVED to receive Trulicity and Engineer, agricultural through [employer] patient assistance program through 01/25/2022 Lack of blood sugar or blood pressure results for the clinical team Note CM Pharmacist has repeatedly encouraged patient to check BP as recommended by providers and upper arm BP monitor provided. However, patient has declined to monitor BP at home   Pharmacist Clinical Goal(s):  Over the next 90 days, patient will achieve adherence to monitoring guidelines and medication adherence to achieve therapeutic efficacy through collaboration with PharmD and provider.    Interventions: 1:1 collaboration with [name], DO regarding development and update of comprehensive plan of care as evidenced by provider attestation and co-signature Inter-disciplinary care team collaboration (see longitudinal plan of care) Perform chart review.  Patient seen for Office Visit with PCP on 11/14.  Referral placed to [place] in [facility] ED on 11/12  related to hematoma/left lower leg wound. Provider advised patient: Referral placed to infectious disease Rx sent for amoxicillin-clavulanate (AUGMENTIN) 500-125 MG tablet  2 times daily for 7 days Office Visit with [place] on 12/04/2021 Referred to vascular for vein mapping and AV fistula placement Office Visit with PCP on 11/6. Provider advised patient:  Discontinue Basaglar insulin completely with hypoglycemia events Patient may keep Trulicity 0.17BL weekly at this time Today confirms taking Augmentin course as directed. Counsel patient on importance of completing course as directed Review and encourage patient to keep upcoming medical appointments  Type 2 DM: Current treatment: Trulicity 3.90 mg weekly Confirms no longer taking Basaglar insulin Denies monitoring home blood sugar Denies signs/symptoms of hypoglycemia since stopped Basaglar Have counseled on s/s of low blood sugars, how to manage lows and to contact office if is having low readings Discussed importance of eating regular well-balanced meals throughout the day, while controlling carbohydrate portions   Medication Assistance: Received message  from PCP on 11/6 and outreached to [place] patient assistance program on 11/7 to request program deactivate Basaglar Rx.  Representative advised latest Basaglar Rx already out of refills and put a note to not send request for further refills Today patient requesting assistance with renewal of patient assistance for Trulicity through [place] for 2024 calendar year Will collaborate with [facility] for aid to patient with re-enrollment   Patient Goals/Self-Care Activities Over the next 90 days, patient will:  - check glucose, document, and provide at future appointments - collaborate with provider on medication access solutions - attend medical appointments as scheduled       Plan: Telephone follow up appointment with care management team member scheduled  for:  02/09/2022 at 8:30 am  Wallace Cullens, PharmD, [place], [place] [phone]

## 2021-12-10 NOTE — Patient Instructions (Signed)
Goals Addressed             This Visit's Progress    Pharmacy Goals       Our goal A1c is less than 7%. This corresponds with fasting sugars less than 130 and 2 hour after meal sugars less than 180. Please check your blood sugar and keep a log of the results  Our goal bad cholesterol, or LDL, is less than 70 . This is why it is important to continue taking your atorvastatin.  Please check your daily weights, as well as follow fluid and salt instructions, as directed by your kidney doctor. Please keep a record of daily weights and bring this with you to your medical appointments  Please check your home blood pressure, keep a log of the results and bring this with you to your medical appointments.  Feel free to call me with any questions or concerns. I look forward to our next call!   Wallace Cullens, PharmD, [place] [phone]

## 2021-12-11 ENCOUNTER — Telehealth: Payer: Self-pay

## 2021-12-11 ENCOUNTER — Other Ambulatory Visit: Payer: Self-pay

## 2021-12-11 ENCOUNTER — Ambulatory Visit: Payer: Self-pay

## 2021-12-11 DIAGNOSIS — E1169 Type 2 diabetes mellitus with other specified complication: Secondary | ICD-10-CM

## 2021-12-11 MED ORDER — ATORVASTATIN CALCIUM 20 MG PO TABS: 20.0000 mg | ORAL_TABLET | Freq: Every day | ORAL | 3 refills | Status: DC
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
## 2021-12-11 NOTE — Telephone Encounter (Signed)
        Patient  visited [place] on 11/12    Telephone encounter attempt :  1st  A HIPAA compliant voice message was left requesting a return call.  Instructed patient to call back   [place], [place] [facility]  [phone] [address], [place], [place] [address] Phone: [phone] Email: [email or web address]

## 2021-12-11 NOTE — Telephone Encounter (Signed)
Requested Prescriptions  Pending Prescriptions Disp Refills   atorvastatin (LIPITOR) 20 MG tablet 90 tablet 3    Sig: Take 1 tablet (20 mg total) by mouth daily.     Cardiovascular:  Antilipid - Statins Failed - 12/11/2021 11:23 AM      Failed - Lipid Panel in normal range within the last 12 months    Cholesterol, Total  Date Value Ref Range Status  01/17/2015 105 100 - 199 mg/dL Final   Cholesterol  Date Value Ref Range Status  05/29/2021 110 <200 mg/dL Final   Cholesterol Piccolo, Waived  Date Value Ref Range Status  08/07/2014 110 <200 mg/dL Final    Comment:                            Desirable                <200                         Borderline High      200- 239                         High                     >239    LDL Cholesterol (Calc)  Date Value Ref Range Status  05/29/2021 29 mg/dL (calc) Final    Comment:    Reference range: <100 . Desirable range <100 mg/dL for primary prevention;   <70 mg/dL for patients with CHD or diabetic patients  with > or = 2 CHD risk factors. [name] Kitchen LDL-C is now calculated using the Martin-Hopkins  calculation, which is a validated novel method providing  better accuracy than the Friedewald equation in the  estimation of LDL-C.  Cresenciano Genre et al. Annamaria Helling. 5329;924(26): 2061-2068  (http://education.[employer].com/faq/FAQ164)    HDL  Date Value Ref Range Status  05/29/2021 66 > OR = 40 mg/dL Final  01/17/2015 48 >39 mg/dL Final   Triglycerides  Date Value Ref Range Status  05/29/2021 73 <150 mg/dL Final   Triglycerides Piccolo,Waived  Date Value Ref Range Status  08/07/2014 159 (H) <150 mg/dL Final    Comment:                            Normal                   <150                         Borderline High     150 - 199                         High                200 - 499                         Very High                >499          Passed - Patient is not pregnant      Passed - Valid encounter within last 12  months    Recent Outpatient Visits  Yesterday Controlled type 2 diabetes mellitus with stage 4 chronic kidney disease, with long-term current use of insulin ([place])   [facility] Delles, Grayland Ormond A, RPH-CPP   2 days ago Benign hypertension with CKD (chronic kidney disease) stage IV [facility])   [facility] [name], DO   1 week ago Benign hypertension with CKD (chronic kidney disease) stage IV [facility])   [facility] [name], DO   2 weeks ago Laceration of left lower extremity, subsequent encounter   [facility] Mecum, Dani Gobble, PA-C   3 weeks ago Acute renal failure superimposed on stage 4 chronic kidney disease, unspecified acute renal failure type [facility])   [place], DO       Future Appointments             Tomorrow [name], DO [facility], [facility]             potassium chloride (KLOR-CON) 10 MEQ tablet 90 tablet 0    Sig: Take 1 tablet (10 mEq total) by mouth daily. Take 10 mEq by mouth daily.     Endocrinology:  Minerals - Potassium Supplementation Failed - 12/11/2021 11:23 AM      Failed - Cr in normal range and within 360 days    Creat  Date Value Ref Range Status  12/01/2021 5.05 (H) 0.70 - 1.30 mg/dL Final   Creatinine, Ser  Date Value Ref Range Status  12/07/2021 4.44 (H) 0.61 - 1.24 mg/dL Final   Creatinine, POC  Date Value Ref Range Status  05/07/2015 0 mg/dL Final         Passed - K in normal range and within 360 days    Potassium  Date Value Ref Range Status  12/07/2021 4.3 3.5 - 5.1 mmol/L Final         Passed - Valid encounter within last 12 months    Recent Outpatient Visits           Yesterday Controlled type 2 diabetes mellitus with stage 4 chronic kidney disease, with long-term current use of insulin ([place])   [facility] Delles, Grayland Ormond A, RPH-CPP   2 days ago  Benign hypertension with CKD (chronic kidney disease) stage IV ([place])   [facility] [name], DO   1 week ago Benign hypertension with CKD (chronic kidney disease) stage IV [facility])   [facility] [name], DO   2 weeks ago Laceration of left lower extremity, subsequent encounter   [place], PA-C   3 weeks ago Acute renal failure superimposed on stage 4 chronic kidney disease, unspecified acute renal failure type [facility])   [facility], [name], DO       Future Appointments             Tomorrow [place] [name], DO [facility], [facility]

## 2021-12-11 NOTE — Telephone Encounter (Signed)
Requested medication (s) are due for refill today: yes  Requested medication (s) are on the active medication list: yes  Last refill:  07/03/21   Future visit scheduled: yes  Notes to clinic:  Unable to refill per protocol, last refill by another provider.      Requested Prescriptions  Pending Prescriptions Disp Refills   potassium chloride (KLOR-CON) 10 MEQ tablet 90 tablet 0    Sig: Take 1 tablet (10 mEq total) by mouth daily. Take 10 mEq by mouth daily.     Endocrinology:  Minerals - Potassium Supplementation Failed - 12/11/2021 11:23 AM      Failed - Cr in normal range and within 360 days    Creat  Date Value Ref Range Status  12/01/2021 5.05 (H) 0.70 - 1.30 mg/dL Final   Creatinine, Ser  Date Value Ref Range Status  12/07/2021 4.44 (H) 0.61 - 1.24 mg/dL Final   Creatinine, POC  Date Value Ref Range Status  05/07/2015 0 mg/dL Final         Passed - K in normal range and within 360 days    Potassium  Date Value Ref Range Status  12/07/2021 4.3 3.5 - 5.1 mmol/L Final         Passed - Valid encounter within last 12 months    Recent Outpatient Visits           Yesterday Controlled type 2 diabetes mellitus with stage 4 chronic kidney disease, with long-term current use of insulin ([place])   [facility] Delles, Grayland Ormond A, RPH-CPP   2 days ago Benign hypertension with CKD (chronic kidney disease) stage IV ([place])   [facility] [place], [name], DO   1 week ago Benign hypertension with CKD (chronic kidney disease) stage IV [facility])   [facility] [name], DO   2 weeks ago Laceration of left lower extremity, subsequent encounter   [facility] Mecum, Dani Gobble, PA-C   3 weeks ago Acute renal failure superimposed on stage 4 chronic kidney disease, unspecified acute renal failure type [facility])   [place], DO       Future Appointments              Tomorrow [name], DO [facility], [facility]            Signed Prescriptions Disp Refills   atorvastatin (LIPITOR) 20 MG tablet 90 tablet 3    Sig: Take 1 tablet (20 mg total) by mouth daily.     Cardiovascular:  Antilipid - Statins Failed - 12/11/2021 11:23 AM      Failed - Lipid Panel in normal range within the last 12 months    Cholesterol, Total  Date Value Ref Range Status  01/17/2015 105 100 - 199 mg/dL Final   Cholesterol  Date Value Ref Range Status  05/29/2021 110 <200 mg/dL Final   Cholesterol Piccolo, Waived  Date Value Ref Range Status  08/07/2014 110 <200 mg/dL Final    Comment:                            Desirable                <200                         Borderline High  200- 239                         High                     >239    LDL Cholesterol (Calc)  Date Value Ref Range Status  05/29/2021 29 mg/dL (calc) Final    Comment:    Reference range: <100 . Desirable range <100 mg/dL for primary prevention;   <70 mg/dL for patients with CHD or diabetic patients  with > or = 2 CHD risk factors. [name] Kitchen LDL-C is now calculated using the Martin-Hopkins  calculation, which is a validated novel method providing  better accuracy than the Friedewald equation in the  estimation of LDL-C.  Cresenciano Genre et al. Annamaria Helling. 3419;622(29): 2061-2068  (http://education.[employer].com/faq/FAQ164)    HDL  Date Value Ref Range Status  05/29/2021 66 > OR = 40 mg/dL Final  01/17/2015 48 >39 mg/dL Final   Triglycerides  Date Value Ref Range Status  05/29/2021 73 <150 mg/dL Final   Triglycerides Piccolo,Waived  Date Value Ref Range Status  08/07/2014 159 (H) <150 mg/dL Final    Comment:                            Normal                   <150                         Borderline High     150 - 199                         High                200 - 499                         Very High                >499          Passed - Patient is  not pregnant      Passed - Valid encounter within last 12 months    Recent Outpatient Visits           Yesterday Controlled type 2 diabetes mellitus with stage 4 chronic kidney disease, with long-term current use of insulin ([place])   [facility] Delles, Grayland Ormond A, RPH-CPP   2 days ago Benign hypertension with CKD (chronic kidney disease) stage IV ([place])   [facility] [name], DO   1 week ago Benign hypertension with CKD (chronic kidney disease) stage IV [facility])   [facility] [name], DO   2 weeks ago Laceration of left lower extremity, subsequent encounter   [place], PA-C   3 weeks ago Acute renal failure superimposed on stage 4 chronic kidney disease, unspecified acute renal failure type [facility])   [facility], [name], DO       Future Appointments             Tomorrow [place] [name], DO [facility], [facility]

## 2021-12-12 ENCOUNTER — Ambulatory Visit: Payer: PPO | Admitting: Family Medicine

## 2021-12-12 ENCOUNTER — Encounter: Payer: Self-pay | Admitting: Family Medicine

## 2021-12-12 VITALS — BP 142/83 | HR 74 | Ht 71.0 in | Wt 337.0 lb

## 2021-12-12 DIAGNOSIS — S81802A Unspecified open wound, left lower leg, initial encounter: Secondary | ICD-10-CM

## 2021-12-12 NOTE — Progress Notes (Signed)
Patient here for [facility] paperwork collection. He was referred to wound care last visit and already had dressing change. Patient not seen for medical evaluation today, apt should have arrived to pick up paper work today. He has been asked to return to ED for suture removal when ready or wound care office for further management of his wound.  [name], [facility] 12/12/2021, 10:40 AM

## 2021-12-13 ENCOUNTER — Other Ambulatory Visit: Payer: Self-pay | Admitting: Family Medicine

## 2021-12-13 DIAGNOSIS — M1A39X0 Chronic gout due to renal impairment, multiple sites, without tophus (tophi): Secondary | ICD-10-CM

## 2021-12-15 ENCOUNTER — Ambulatory Visit: Payer: Self-pay

## 2021-12-15 ENCOUNTER — Encounter: Payer: Self-pay | Admitting: Oncology

## 2021-12-15 ENCOUNTER — Inpatient Hospital Stay: Payer: PPO | Attending: Oncology | Admitting: Oncology

## 2021-12-15 ENCOUNTER — Inpatient Hospital Stay: Payer: PPO

## 2021-12-15 ENCOUNTER — Ambulatory Visit: Payer: Self-pay | Admitting: *Deleted

## 2021-12-15 VITALS — BP 128/92 | HR 81 | Temp 98.1°F | Resp 18

## 2021-12-15 DIAGNOSIS — D631 Anemia in chronic kidney disease: Secondary | ICD-10-CM | POA: Diagnosis not present

## 2021-12-15 DIAGNOSIS — D539 Nutritional anemia, unspecified: Secondary | ICD-10-CM | POA: Insufficient documentation

## 2021-12-15 DIAGNOSIS — M545 Low back pain, unspecified: Secondary | ICD-10-CM

## 2021-12-15 DIAGNOSIS — N184 Chronic kidney disease, stage 4 (severe): Secondary | ICD-10-CM | POA: Diagnosis not present

## 2021-12-15 DIAGNOSIS — Z7985 Long-term (current) use of injectable non-insulin antidiabetic drugs: Secondary | ICD-10-CM | POA: Diagnosis not present

## 2021-12-15 DIAGNOSIS — M549 Dorsalgia, unspecified: Secondary | ICD-10-CM | POA: Diagnosis not present

## 2021-12-15 DIAGNOSIS — Z992 Dependence on renal dialysis: Secondary | ICD-10-CM | POA: Diagnosis not present

## 2021-12-15 DIAGNOSIS — Z79899 Other long term (current) drug therapy: Secondary | ICD-10-CM | POA: Insufficient documentation

## 2021-12-15 LAB — CBC WITH DIFFERENTIAL/PLATELET
Abs Immature Granulocytes: 0.02 10*3/uL (ref 0.00–0.07)
Basophils Absolute: 0.1 10*3/uL (ref 0.0–0.1)
Basophils Relative: 2 %
Eosinophils Absolute: 0.2 10*3/uL (ref 0.0–0.5)
Eosinophils Relative: 6 %
HCT: 32.3 % — ABNORMAL LOW (ref 39.0–52.0)
Hemoglobin: 9.8 g/dL — ABNORMAL LOW (ref 13.0–17.0)
Immature Granulocytes: 1 %
Lymphocytes Relative: 31 %
Lymphs Abs: 1.1 10*3/uL (ref 0.7–4.0)
MCH: 33.6 pg (ref 26.0–34.0)
MCHC: 30.3 g/dL (ref 30.0–36.0)
MCV: 110.6 fL — ABNORMAL HIGH (ref 80.0–100.0)
Monocytes Absolute: 0.3 10*3/uL (ref 0.1–1.0)
Monocytes Relative: 10 %
Neutro Abs: 1.7 10*3/uL (ref 1.7–7.7)
Neutrophils Relative %: 50 %
Platelets: 170 10*3/uL (ref 150–400)
RBC: 2.92 MIL/uL — ABNORMAL LOW (ref 4.22–5.81)
RDW: 18.8 % — ABNORMAL HIGH (ref 11.5–15.5)
WBC: 3.4 10*3/uL — ABNORMAL LOW (ref 4.0–10.5)
nRBC: 0 % (ref 0.0–0.2)

## 2021-12-15 LAB — IRON AND TIBC
Iron: 107 ug/dL (ref 45–182)
Saturation Ratios: 38 % (ref 17.9–39.5)
TIBC: 283 ug/dL (ref 250–450)
UIBC: 176 ug/dL

## 2021-12-15 LAB — RETIC PANEL
Immature Retic Fract: 20.6 % — ABNORMAL HIGH (ref 2.3–15.9)
RBC.: 2.52 MIL/uL — ABNORMAL LOW (ref 4.22–5.81)
Retic Count, Absolute: 98 10*3/uL (ref 19.0–186.0)
Retic Ct Pct: 3.9 % — ABNORMAL HIGH (ref 0.4–3.1)
Reticulocyte Hemoglobin: 18.9 pg — ABNORMAL LOW (ref >27.9)

## 2021-12-15 LAB — FERRITIN: Ferritin: 43 ng/mL (ref 24–336)

## 2021-12-15 MED ORDER — VITAMIN B-12 1000 MCG PO TABS: 1000.0000 ug | ORAL_TABLET | Freq: Every day | ORAL | 1 refills | Status: DC

## 2021-12-15 NOTE — Assessment & Plan Note (Signed)
Anemia is likely due to chronic kidney disease.  About other etiologies for Check CBC, iron, TIBC ferritin, multiple myeloma panel. We discussed about IV Venofer treatment to further increase his iron level to help increase his hemoglobin level.I discussed about the potential risks including but not limited to allergic reactions/infusion reactions including anaphylactic reactions, phlebitis, high blood pressure, wheezing, SOB, skin rash, weight gain, leg swelling, headache, nausea and fatigue, etc. patient agrees with the plan if he needs to. Today's iron panel showed ferritin less than 200.  I will arrange patient to get IV Venofer weekly x3. We also discussed about future plan of erythropoietin replacement therapy if hemoglobin does not improve despite ferritin improvement to be above 200.  He agrees with the plan.

## 2021-12-15 NOTE — Telephone Encounter (Signed)
Requested Prescriptions  Pending Prescriptions Disp Refills   allopurinol (ZYLOPRIM) 100 MG tablet [Pharmacy Med Name: ALLOPURINOL 100 MG TABLET] 180 tablet 0    Sig: Take 2 tablets (200 mg total) by mouth daily.     Endocrinology:  Gout Agents - allopurinol Failed - 12/13/2021  9:30 AM      Failed - Cr in normal range and within 360 days    Creat  Date Value Ref Range Status  12/01/2021 5.05 (H) 0.70 - 1.30 mg/dL Final   Creatinine, Ser  Date Value Ref Range Status  12/07/2021 4.44 (H) 0.61 - 1.24 mg/dL Final   Creatinine, POC  Date Value Ref Range Status  05/07/2015 0 mg/dL Final         Failed - CBC within normal limits and completed in the last 12 months    WBC  Date Value Ref Range Status  12/07/2021 4.7 4.0 - 10.5 K/uL Final   RBC  Date Value Ref Range Status  12/07/2021 2.71 (L) 4.22 - 5.81 MIL/uL Final   Hemoglobin  Date Value Ref Range Status  12/07/2021 9.1 (L) 13.0 - 17.0 g/dL Final  01/17/2015 14.4 12.6 - 17.7 g/dL Final   HCT  Date Value Ref Range Status  12/07/2021 30.6 (L) 39.0 - 52.0 % Final   Hematocrit  Date Value Ref Range Status  01/17/2015 47.6 37.5 - 51.0 % Final   MCHC  Date Value Ref Range Status  12/07/2021 29.7 (L) 30.0 - 36.0 g/dL Final   [facility]  Date Value Ref Range Status  12/07/2021 33.6 26.0 - 34.0 pg Final   MCV  Date Value Ref Range Status  12/07/2021 112.9 (H) 80.0 - 100.0 fL Final  01/17/2015 91 79 - 97 fL Final   No results found for: "PLTCOUNTKUC", "LABPLAT", "POCPLA" RDW  Date Value Ref Range Status  12/07/2021 21.7 (H) 11.5 - 15.5 % Final  01/17/2015 15.7 (H) 12.3 - 15.4 % Final         Passed - Uric Acid in normal range and within 360 days    Uric Acid, Serum  Date Value Ref Range Status  05/29/2021 6.3 4.0 - 8.0 mg/dL Final    Comment:    Therapeutic target for gout patients: <6.0 mg/dL .    Uric Acid  Date Value Ref Range Status  01/17/2015 6.0 3.7 - 8.6 mg/dL Final    Comment:                Therapeutic target for gout patients: <6.0         Passed - Valid encounter within last 12 months    Recent Outpatient Visits           3 days ago Non-healing wound of left lower extremity   [place], DO   5 days ago Controlled type 2 diabetes mellitus with stage 4 chronic kidney disease, with long-term current use of insulin ([place])   [facility] Delles, Grayland Ormond A, RPH-CPP   6 days ago Benign hypertension with CKD (chronic kidney disease) stage IV [facility])   [facility] [name], DO   2 weeks ago Benign hypertension with CKD (chronic kidney disease) stage IV [facility])   [facility] [name], DO   3 weeks ago Laceration of left lower extremity, subsequent encounter   [facility] Mecum, Dani Gobble, [place]

## 2021-12-15 NOTE — Telephone Encounter (Signed)
  Chief Complaint: left hip and leg pain Symptoms: severe pain and cramping in leg- unable to walk- patient is calling from hospital Frequency: chronic- but worse Pertinent Negatives: Patient denies   Disposition: [x] ED /[] Urgent Care (no appt availability in office) / [] Appointment(In office/virtual)/ []  [facility]/ [] Home Care/ [] Refused Recommended Disposition /[]  Mobile Bus/ []  Follow-up with PCP Additional Notes: Advised patient he needs to let someone there know he is in pain- may need to be in ED for severe pain.

## 2021-12-15 NOTE — Telephone Encounter (Signed)
Pt has already been triaged for same issue today by another NT. Will close out NT encounter. Please refer to other NT encounter for information.   Summary: left leg discomfort   The patient is experiencing cramps in their left leg  The patient shares that they have experienced symptoms for roughly 1 month  The patient would like to speak with a member of clinical staff regarding their discomfort  Please contact further when possible

## 2021-12-15 NOTE — Assessment & Plan Note (Signed)
Today's lab shows persistent macrocytic anemia.  Likely due to chronic alcohol use.  Recommend patient to take empiric B12 supplementation.  Will check B12 and folate level at next visit.

## 2021-12-15 NOTE — Addendum Note (Signed)
Addended by: Earlie Server on: 12/15/2021 08:12 PM   Modules accepted: Orders
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
## 2021-12-15 NOTE — Progress Notes (Signed)
Pt here to establish care for Anemia. Pt in wheelchair, in pain and very uncomfortable. Pt put in cosult room and offered to help him into recliner or sofa and he refused. Pillow also offered for support and he refused. He states "I'm in so much pain I don't even know why I'm here."

## 2021-12-15 NOTE — Telephone Encounter (Signed)
Noted. Dr. Raliegh Ip is aware.

## 2021-12-15 NOTE — Telephone Encounter (Signed)
Reason for Disposition  Unable to walk  Answer Assessment - Initial Assessment Questions 1. ONSET: "When did the pain start?"      Chronic leg pain 2. LOCATION: "Where is the pain located?"      Left hip,leg 3. PAIN: "How bad is the pain?"    (Scale 1-10; or mild, moderate, severe)   -  MILD (1-3): doesn't interfere with normal activities    -  MODERATE (4-7): interferes with normal activities (e.g., work or school) or awakens from sleep, limping    -  SEVERE (8-10): excruciating pain, unable to do any normal activities, unable to walk     severe 4. WORK OR EXERCISE: "Has there been any recent work or exercise that involved this part of the body?"       Constant cramping 5. CAUSE: "What do you think is causing the leg pain?"     Unsure- sitting chairs 6. OTHER SYMPTOMS: "Do you have any other symptoms?" (e.g., chest pain, back pain, breathing difficulty, swelling, rash, fever, numbness, weakness)     Back pain 7. PREGNANCY: "Is there any chance you are pregnant?" "When was your last menstrual period?"     na  Protocols used: Leg Pain-A-AH

## 2021-12-15 NOTE — Assessment & Plan Note (Addendum)
Patient is a poor historian. Due to the pain, he is not cooperative in answering questions.  Etiology of the back pain is unknown.  Today we spent most of the time discussing about anemia work-up and possible treatments.  Encourage patient to follow-up with primary care provider for management of his multiple other medical conditions.

## 2021-12-16 ENCOUNTER — Telehealth: Payer: Self-pay

## 2021-12-16 LAB — KAPPA/LAMBDA LIGHT CHAINS
Kappa free light chain: 122.6 mg/L — ABNORMAL HIGH (ref 3.3–19.4)
Kappa, lambda light chain ratio: 1.13 (ref 0.26–1.65)
Lambda free light chains: 108.5 mg/L — ABNORMAL HIGH (ref 5.7–26.3)

## 2021-12-16 NOTE — Telephone Encounter (Signed)
-----   Message from Earlie Server, MD sent at 12/15/2021  8:04 PM EST ----- Please let patient know that his iron level is not good enough and I recommend patient to get IV Venofer weekly x4 to increase iron level which may help with his hemoglobin level.  Please arrange. Also recommend patient to start taking empiric vitamin B12 supplementation.  A prescription has been sent to his pharmacy. Follow-up in 2 months, labs prior to MD +/- Venofer +/- B12 injection.-Labs ordered.  Thank you

## 2021-12-16 NOTE — Telephone Encounter (Signed)
Called pt multiple times and no answer. Left detailed message to let him know results and MD recommendations. Advised to call back with any questions. Will also sent Mychart message.   Please schedule:  Venofer weekly x4 (first venofer is NEW) Labs in 2 months MD/ Venofer/ B12 inj 1-2 days AFTER labs

## 2021-12-22 ENCOUNTER — Other Ambulatory Visit: Payer: Self-pay | Admitting: Family Medicine

## 2021-12-22 ENCOUNTER — Encounter: Payer: Self-pay | Admitting: Surgery

## 2021-12-22 ENCOUNTER — Other Ambulatory Visit: Payer: Self-pay

## 2021-12-22 ENCOUNTER — Ambulatory Visit (INDEPENDENT_AMBULATORY_CARE_PROVIDER_SITE_OTHER): Payer: PPO | Admitting: Surgery

## 2021-12-22 VITALS — BP 118/80 | HR 85 | Temp 97.8°F | Wt 330.0 lb

## 2021-12-22 DIAGNOSIS — S81812A Laceration without foreign body, left lower leg, initial encounter: Secondary | ICD-10-CM

## 2021-12-22 DIAGNOSIS — M159 Polyosteoarthritis, unspecified: Secondary | ICD-10-CM

## 2021-12-22 DIAGNOSIS — S81812D Laceration without foreign body, left lower leg, subsequent encounter: Secondary | ICD-10-CM

## 2021-12-22 DIAGNOSIS — G894 Chronic pain syndrome: Secondary | ICD-10-CM

## 2021-12-22 LAB — MULTIPLE MYELOMA PANEL, SERUM
Albumin SerPl Elph-Mcnc: 2.9 g/dL (ref 2.9–4.4)
Albumin/Glob SerPl: 0.8 (ref 0.7–1.7)
Alpha 1: 0.3 g/dL (ref 0.0–0.4)
Alpha2 Glob SerPl Elph-Mcnc: 0.5 g/dL (ref 0.4–1.0)
B-Globulin SerPl Elph-Mcnc: 0.9 g/dL (ref 0.7–1.3)
Gamma Glob SerPl Elph-Mcnc: 1.9 g/dL — ABNORMAL HIGH (ref 0.4–1.8)
Globulin, Total: 3.7 g/dL (ref 2.2–3.9)
IgA: 624 mg/dL — ABNORMAL HIGH (ref 90–386)
IgG (Immunoglobin G), Serum: 2021 mg/dL — ABNORMAL HIGH (ref 603–1613)
IgM (Immunoglobulin M), Srm: 47 mg/dL (ref 20–172)
Total Protein ELP: 6.6 g/dL (ref 6.0–8.5)
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
## 2021-12-22 NOTE — Patient Instructions (Signed)
Follow up with Wound care clinic 01/01/2022.   You will need to wash your leg with soap daily. Apply a new dressing daily.

## 2021-12-23 NOTE — Telephone Encounter (Signed)
Requested medications are due for refill today.  unsure  Requested medications are on the active medications list.  yes  Last refill. 06/18/2021 #45 2 rf  Future visit scheduled.   no  Notes to clinic.  Refill not delegated..    Requested Prescriptions  Pending Prescriptions Disp Refills   traMADol (ULTRAM) 50 MG tablet [Pharmacy Med Name: TRAMADOL HCL 50 MG TABLET] 45 tablet 0    Sig: Take 1-2 tablets (50-100 mg total) by mouth every 12 (twelve) hours as needed.     Not Delegated - Analgesics:  Opioid Agonists Failed - 12/22/2021  2:41 PM      Failed - This refill cannot be delegated      Passed - Urine Drug Screen completed in last 360 days      Passed - Valid encounter within last 3 months    Recent Outpatient Visits           1 week ago Non-healing wound of left lower extremity   [place], DO   1 week ago Controlled type 2 diabetes mellitus with stage 4 chronic kidney disease, with long-term current use of insulin ([place])   [place], Grayland Ormond A, RPH-CPP   2 weeks ago Benign hypertension with CKD (chronic kidney disease) stage IV [facility])   [facility] [name], DO   3 weeks ago Benign hypertension with CKD (chronic kidney disease) stage IV [facility])   [facility] [name], DO   4 weeks ago Laceration of left lower extremity, subsequent encounter   [facility] Mecum, Dani Gobble, [place]

## 2021-12-24 ENCOUNTER — Ambulatory Visit: Payer: PPO | Admitting: Family Medicine

## 2021-12-24 ENCOUNTER — Ambulatory Visit: Payer: Self-pay

## 2021-12-24 ENCOUNTER — Other Ambulatory Visit: Payer: Self-pay | Admitting: Family Medicine

## 2021-12-24 DIAGNOSIS — M159 Polyosteoarthritis, unspecified: Secondary | ICD-10-CM

## 2021-12-24 DIAGNOSIS — G894 Chronic pain syndrome: Secondary | ICD-10-CM

## 2021-12-24 DIAGNOSIS — R252 Cramp and spasm: Secondary | ICD-10-CM

## 2021-12-24 MED ORDER — CYCLOBENZAPRINE HCL 10 MG PO TABS: 10.0000 mg | ORAL_TABLET | Freq: Three times a day (TID) | ORAL | 2 refills | Status: DC | PRN

## 2021-12-24 NOTE — Telephone Encounter (Signed)
Please notify patient  I have sent rx for Flexeril muscle relaxant, take as needed.  Also there are good remedy options / natural options for Leg cramps  - Try spoonful of yellow mustard to relieve leg cramps or try daily to prevent the problem  - OTC natural option with electrolytes is Hyland's Leg Cramps (Dissolving tablet) take as needed for muscle cramps  [name], [facility] 12/24/2021, 8:28 AM

## 2021-12-24 NOTE — Telephone Encounter (Signed)
Requested medication (s) are due for refill today: no  Requested medication (s) are on the active medication list: yes    Last refill: 12/23/21  #45  0 refills  Future visit scheduled no  Notes to clinic:Receipt confirmed by pharmacy 12/23/21 a 4:45. Cannot refuse non-delegated meds per protocol.  Requested Prescriptions  Pending Prescriptions Disp Refills   traMADol (ULTRAM) 50 MG tablet [Pharmacy Med Name: TRAMADOL HCL 50 MG TABLET] 45 tablet 0    Sig: Take 1-2 tablets (50-100 mg total) by mouth every 12 (twelve) hours as needed.     Not Delegated - Analgesics:  Opioid Agonists Failed - 12/24/2021  8:54 AM      Failed - This refill cannot be delegated      Passed - Urine Drug Screen completed in last 360 days      Passed - Valid encounter within last 3 months    Recent Outpatient Visits           1 week ago Non-healing wound of left lower extremity   [place], DO   2 weeks ago Controlled type 2 diabetes mellitus with stage 4 chronic kidney disease, with long-term current use of insulin ([place])   [facility] Delles, Grayland Ormond A, RPH-CPP   2 weeks ago Benign hypertension with CKD (chronic kidney disease) stage IV [facility])   [facility] [name], DO   3 weeks ago Benign hypertension with CKD (chronic kidney disease) stage IV [facility])   [place], DO   1 month ago Laceration of left lower extremity, subsequent encounter   [facility] Mecum, Dani Gobble, [place]

## 2021-12-24 NOTE — Telephone Encounter (Signed)
   Chief Complaint: Left leg cramps Symptoms: Cramps Frequency: 3 months ago - "doctor knows about it." Pertinent Negatives: Patient denies any other symptoms. Disposition: [] ED /[] Urgent Care (no appt availability in office) / [] Appointment(In office/virtual)/ []  [facility]/ [] Home Care/ [] Refused Recommended Disposition /[] [place] [facility]/ [x]  Follow-up with PCP Additional Notes: Pt. Declines OV - Requests medication be sent to pharmacy. ",I need muscle relaxer's or something."Please advise.  Answer Assessment - Initial Assessment Questions 1. ONSET: "When did the pain start?"      3 months ago 2. LOCATION: "Where is the pain located?"      Left leg - hip to knee 3. PAIN: "How bad is the pain?"    (Scale 1-10; or mild, moderate, severe)   -  MILD (1-3): doesn't interfere with normal activities    -  MODERATE (4-7): interferes with normal activities (e.g., work or school) or awakens from sleep, limping    -  SEVERE (8-10): excruciating pain, unable to do any normal activities, unable to walk     Severe 4. WORK OR EXERCISE: "Has there been any recent work or exercise that involved this part of the body?"      No 5. CAUSE: "What do you think is causing the leg pain?"     Unsure 6. OTHER SYMPTOMS: "Do you have any other symptoms?" (e.g., chest pain, back pain, breathing difficulty, swelling, rash, fever, numbness, weakness)     No 7. PREGNANCY: "Is there any chance you are pregnant?" "When was your last menstrual period?"     N/a  Protocols used: Leg Pain-A-AH

## 2021-12-24 NOTE — Telephone Encounter (Signed)
Called pt, no answer. VM full.

## 2021-12-25 ENCOUNTER — Telehealth: Payer: Self-pay | Admitting: Pharmacy Technician

## 2021-12-25 DIAGNOSIS — Z596 Low income: Secondary | ICD-10-CM

## 2021-12-25 NOTE — Progress Notes (Signed)
[facility])                                            Early Team    12/25/2021  COHEN BOETTNER 09/27/1965 [id number]                                      Medication Assistance Referral-FOR 2024 RE ENROLLMENT  Referral From: [place]: Danelle Berry / [name] Patient application portion:  Mailed Provider application portion: Faxed  to Dr. [name] Provider address/fax verified via: Office website  Dmoni Fortson P. Arlin Savona, [place]  [phone]

## 2021-12-31 ENCOUNTER — Inpatient Hospital Stay: Payer: PPO | Attending: Oncology

## 2021-12-31 MED FILL — Iron Sucrose Inj 20 MG/ML (Fe Equiv): INTRAVENOUS | Qty: 10 | Status: AC

## 2022-01-01 ENCOUNTER — Ambulatory Visit: Payer: PPO | Admitting: Physician Assistant

## 2022-01-05 ENCOUNTER — Ambulatory Visit: Payer: PPO | Admitting: Family Medicine

## 2022-01-06 MED FILL — Iron Sucrose Inj 20 MG/ML (Fe Equiv): INTRAVENOUS | Qty: 10 | Status: AC

## 2022-01-07 ENCOUNTER — Inpatient Hospital Stay: Payer: PPO

## 2022-01-08 ENCOUNTER — Other Ambulatory Visit: Payer: Self-pay | Admitting: Family Medicine

## 2022-01-08 ENCOUNTER — Ambulatory Visit: Payer: Self-pay

## 2022-01-08 ENCOUNTER — Ambulatory Visit: Payer: PPO | Admitting: Family Medicine

## 2022-01-08 NOTE — Telephone Encounter (Signed)
Requested medication (s) are due for refill today:   Not sure  Requested medication (s) are on the active medication list:   Yes  Future visit scheduled:   No   Last ordered: 02/27/2020 - 12/11/2022 by a historical provider.  Returned because prescribed by another provider.   Requested Prescriptions  Pending Prescriptions Disp Refills   bumetanide (BUMEX) 1 MG tablet      Sig: Take by mouth. Take 2 tablets (2 mg total) by mouth in the morning and 2 tablets (2 mg total) at noon and 2 tablets (2 mg total) in the evening.     Cardiovascular:  Diuretics - Loop Failed - 01/08/2022 10:43 AM      Failed - Ca in normal range and within 180 days    Calcium  Date Value Ref Range Status  12/07/2021 8.8 (L) 8.9 - 10.3 mg/dL Final         Failed - Cr in normal range and within 180 days    Creat  Date Value Ref Range Status  12/01/2021 5.05 (H) 0.70 - 1.30 mg/dL Final   Creatinine, Ser  Date Value Ref Range Status  12/07/2021 4.44 (H) 0.61 - 1.24 mg/dL Final   Creatinine, POC  Date Value Ref Range Status  05/07/2015 0 mg/dL Final         Passed - K in normal range and within 180 days    Potassium  Date Value Ref Range Status  12/07/2021 4.3 3.5 - 5.1 mmol/L Final         Passed - Na in normal range and within 180 days    Sodium  Date Value Ref Range Status  12/07/2021 140 135 - 145 mmol/L Final  08/07/2014 142 134 - 144 mmol/L Final         Passed - Cl in normal range and within 180 days    Chloride  Date Value Ref Range Status  12/07/2021 110 98 - 111 mmol/L Final         Passed - Mg Level in normal range and within 180 days    Magnesium  Date Value Ref Range Status  11/14/2021 1.9 1.7 - 2.4 mg/dL Final    Comment:    Performed at [facility], [address] [place], [place] [address]         Passed - Last BP in normal range    BP Readings from Last 1 Encounters:  12/22/21 118/80         Passed - Valid encounter within last 6 months    Recent  Outpatient Visits           3 weeks ago Non-healing wound of left lower extremity   [place], DO   4 weeks ago Controlled type 2 diabetes mellitus with stage 4 chronic kidney disease, with long-term current use of insulin ([place])   [facility] Delles, Grayland Ormond A, RPH-CPP   1 month ago Benign hypertension with CKD (chronic kidney disease) stage IV [facility])   [facility] [name], DO   1 month ago Benign hypertension with CKD (chronic kidney disease) stage IV [facility])   [place], DO   1 month ago Laceration of left lower extremity, subsequent encounter   [facility] Mecum, Dani Gobble, [place]

## 2022-01-08 NOTE — Telephone Encounter (Signed)
Medication Refill - Medication: bumetanide (BUMEX) 1 MG tablet   Has the patient contacted their pharmacy? Yes.     Preferred Pharmacy (with phone number or street name):  [place], [place] Phone: [phone]  Fax: [phone]     Has the patient been seen for an appointment in the last year OR does the patient have an upcoming appointment? Yes.    Please assist patient further

## 2022-01-08 NOTE — Telephone Encounter (Signed)
Pt states he has cramps in his lt leg x96m   Pt requesting a cb from a nurse   Called pt. Answer phone then hung up.
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
## 2022-01-09 ENCOUNTER — Ambulatory Visit: Payer: PPO | Admitting: Internal Medicine

## 2022-01-12 ENCOUNTER — Ambulatory Visit: Payer: PPO | Admitting: Family Medicine

## 2022-01-12 ENCOUNTER — Other Ambulatory Visit: Payer: Self-pay | Admitting: Family Medicine

## 2022-01-12 DIAGNOSIS — G894 Chronic pain syndrome: Secondary | ICD-10-CM

## 2022-01-12 DIAGNOSIS — M159 Polyosteoarthritis, unspecified: Secondary | ICD-10-CM

## 2022-01-13 ENCOUNTER — Telehealth: Payer: Self-pay | Admitting: Oncology

## 2022-01-13 ENCOUNTER — Ambulatory Visit: Payer: PPO | Admitting: Family Medicine

## 2022-01-13 NOTE — Telephone Encounter (Signed)
This patient called and wants to know if he can cancel tomorrow's Venofer and come next week. He already has an appointment next week. He would have to add an appointment for Venofer after last date of Venofer. I told him the doctor would have to decide that. He said he doesn't have someone to come with him tomorrow.

## 2022-01-13 NOTE — Telephone Encounter (Signed)
Requested medication (s) are due for refill today  -provider review   Requested medication (s) are on the active medication list -yes  Future visit scheduled -yes  Last refill: 12/23/21 #45  Notes to clinic: non delegated Rx  Requested Prescriptions  Pending Prescriptions Disp Refills   traMADol (ULTRAM) 50 MG tablet [Pharmacy Med Name: TRAMADOL HCL 50 MG TABLET] 45 tablet 0    Sig: Take 1-2 tablets (50-100 mg total) by mouth every 12 (twelve) hours as needed.     Not Delegated - Analgesics:  Opioid Agonists Failed - 01/12/2022  5:39 PM      Failed - This refill cannot be delegated      Passed - Urine Drug Screen completed in last 360 days      Passed - Valid encounter within last 3 months    Recent Outpatient Visits           1 month ago Non-healing wound of left lower extremity   [place], DO   1 month ago Controlled type 2 diabetes mellitus with stage 4 chronic kidney disease, with long-term current use of insulin ([place])   [facility] Delles, Grayland Ormond A, RPH-CPP   1 month ago Benign hypertension with CKD (chronic kidney disease) stage IV ([place])   [facility], [name], DO   1 month ago Benign hypertension with CKD (chronic kidney disease) stage IV ([place])   [place], DO   1 month ago Laceration of left lower extremity, subsequent encounter   [place], PA-C       Future Appointments             In 1 week [place], [name], DO [facility], [facility]               Requested Prescriptions  Pending Prescriptions Disp Refills   traMADol (ULTRAM) 50 MG tablet [Pharmacy Med Name: TRAMADOL HCL 50 MG TABLET] 45 tablet 0    Sig: Take 1-2 tablets (50-100 mg total) by mouth every 12 (twelve) hours as needed.     Not Delegated - Analgesics:  Opioid Agonists Failed - 01/12/2022  5:39 PM       Failed - This refill cannot be delegated      Passed - Urine Drug Screen completed in last 360 days      Passed - Valid encounter within last 3 months    Recent Outpatient Visits           1 month ago Non-healing wound of left lower extremity   [place], DO   1 month ago Controlled type 2 diabetes mellitus with stage 4 chronic kidney disease, with long-term current use of insulin ([place])   [facility] Delles, Grayland Ormond A, RPH-CPP   1 month ago Benign hypertension with CKD (chronic kidney disease) stage IV [facility])   [facility] [name], DO   1 month ago Benign hypertension with CKD (chronic kidney disease) stage IV [facility])   [place], DO   1 month ago Laceration of left lower extremity, subsequent encounter   [facility] Mecum, Dani Gobble, PA-C       Future Appointments             In 1 week [place], [name], DO [facility], [facility]

## 2022-01-14 ENCOUNTER — Inpatient Hospital Stay: Payer: PPO

## 2022-01-20 MED FILL — Iron Sucrose Inj 20 MG/ML (Fe Equiv): INTRAVENOUS | Qty: 10 | Status: AC

## 2022-01-21 ENCOUNTER — Inpatient Hospital Stay: Payer: PPO

## 2022-01-22 ENCOUNTER — Encounter: Payer: Self-pay | Admitting: Family Medicine

## 2022-01-22 ENCOUNTER — Telehealth: Payer: Self-pay | Admitting: Family Medicine

## 2022-01-22 ENCOUNTER — Ambulatory Visit (INDEPENDENT_AMBULATORY_CARE_PROVIDER_SITE_OTHER): Payer: PPO | Admitting: Family Medicine

## 2022-01-22 VITALS — BP 128/90 | HR 88 | Temp 98.0°F | Ht 71.0 in | Wt 327.4 lb

## 2022-01-22 DIAGNOSIS — G894 Chronic pain syndrome: Secondary | ICD-10-CM | POA: Diagnosis not present

## 2022-01-22 DIAGNOSIS — E876 Hypokalemia: Secondary | ICD-10-CM | POA: Diagnosis not present

## 2022-01-22 DIAGNOSIS — N184 Chronic kidney disease, stage 4 (severe): Secondary | ICD-10-CM | POA: Diagnosis not present

## 2022-01-22 DIAGNOSIS — I129 Hypertensive chronic kidney disease with stage 1 through stage 4 chronic kidney disease, or unspecified chronic kidney disease: Secondary | ICD-10-CM | POA: Diagnosis not present

## 2022-01-22 DIAGNOSIS — D649 Anemia, unspecified: Secondary | ICD-10-CM | POA: Diagnosis not present

## 2022-01-22 MED ORDER — OXYCODONE HCL 5 MG PO TABS: 5.0000 mg | ORAL_TABLET | Freq: Four times a day (QID) | ORAL | 0 refills | Status: DC | PRN

## 2022-01-22 MED ORDER — POTASSIUM CHLORIDE ER 10 MEQ PO TBCR: 10.0000 meq | EXTENDED_RELEASE_TABLET | Freq: Every day | ORAL | 2 refills | Status: DC

## 2022-01-22 NOTE — Telephone Encounter (Signed)
Pt is calling to see if there is something that can be done to remain with Dr. Raliegh Ip. Pt states that he has missed appt with [facility] because he had appts at the same time. Pt reports that he has serious medical problems and would like to reman with Dr. Raliegh Ip. Please advise CB- [phone]

## 2022-01-22 NOTE — Patient Instructions (Addendum)
Thank you for coming to the office today.  Restart Potassium tablets  Stop Tramadol if not working  I will order the Oxycodone for pain control, take 1 every 6-8 hours or 3 times a day as needed for pain.  If it helps, I would suggest a referral to Pain Management.  Please schedule a Follow-up Appointment to: Return if symptoms worsen or fail to improve.  If you have any other questions or concerns, please feel free to call the office or send a message through [place]. You may also schedule an earlier appointment if necessary.  Additionally, you may be receiving a survey about your experience at our office within a few days to 1 week by e-mail or mail. We value your feedback.  [name], DO [place]
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
## 2022-01-23 NOTE — Telephone Encounter (Signed)
Pt is calling back to follow up on the request to remain a patient of Dr.K.   Callback - [phone]    Please advise.

## 2022-01-23 NOTE — Telephone Encounter (Signed)
Patient has had the following no show appointments and same day cancellations for 2023.   No shows 02/18/21 06/02/21 11/03/21 - Final no show letter was sent 01/05/22 01/08/22 01/09/22  Same day cancellations  06/04/21 appt @ 8:20 cancelled at 8:00 01/05/22 appt @ 9:00 cancelled at 9:06.   Per [place]'s No Show policy and Dr. Parks Ranger patient will need to seek new medical care with a new PCP.

## 2022-01-27 MED FILL — Iron Sucrose Inj 20 MG/ML (Fe Equiv): INTRAVENOUS | Qty: 10 | Status: AC

## 2022-01-27 NOTE — Telephone Encounter (Signed)
Left patient a message.

## 2022-01-27 NOTE — Telephone Encounter (Signed)
Pt called to speak with Caryl Pina about practice dismissal / please advise

## 2022-01-28 ENCOUNTER — Inpatient Hospital Stay: Payer: PPO

## 2022-01-29 ENCOUNTER — Other Ambulatory Visit: Payer: Self-pay | Admitting: Family Medicine

## 2022-01-29 NOTE — Telephone Encounter (Signed)
Requested medication (s) are due for refill today - no  Requested medication (s) are on the active medication list -no  Future visit scheduled -no  Last refill: 01/15/22  Notes to clinic: non delegated Rx- medication no longer listed on current medication list  Requested Prescriptions  Pending Prescriptions Disp Refills   traMADol (ULTRAM) 50 MG tablet [Pharmacy Med Name: TRAMADOL HCL 50 MG TABLET] 45 tablet 0    Sig: Take 1-2 tablets (50-100 mg total) by mouth every 12 (twelve) hours as needed.     Not Delegated - Analgesics:  Opioid Agonists Failed - 01/29/2022  9:41 AM      Failed - This refill cannot be delegated      Passed - Urine Drug Screen completed in last 360 days      Passed - Valid encounter within last 3 months    Recent Outpatient Visits           1 week ago Chronic pain syndrome   [facility] [place], [name], DO   1 month ago Non-healing wound of left lower extremity   [place], DO   1 month ago Controlled type 2 diabetes mellitus with stage 4 chronic kidney disease, with long-term current use of insulin ([place])   [place], Grayland Ormond A, RPH-CPP   1 month ago Benign hypertension with CKD (chronic kidney disease) stage IV [facility])   [facility], [name], DO   1 month ago Benign hypertension with CKD (chronic kidney disease) stage IV [facility])   [place], DO       Future Appointments             In 1 month Teodora Medici, [facility], [facility]               Requested Prescriptions  Pending Prescriptions Disp Refills   traMADol (ULTRAM) 50 MG tablet [Pharmacy Med Name: TRAMADOL HCL 50 MG TABLET] 45 tablet 0    Sig: Take 1-2 tablets (50-100 mg total) by mouth every 12 (twelve) hours as needed.     Not Delegated - Analgesics:  Opioid Agonists Failed - 01/29/2022  9:41 AM       Failed - This refill cannot be delegated      Passed - Urine Drug Screen completed in last 360 days      Passed - Valid encounter within last 3 months    Recent Outpatient Visits           1 week ago Chronic pain syndrome   [facility] [place], [name], DO   1 month ago Non-healing wound of left lower extremity   [place], DO   1 month ago Controlled type 2 diabetes mellitus with stage 4 chronic kidney disease, with long-term current use of insulin ([place])   [place], Grayland Ormond A, RPH-CPP   1 month ago Benign hypertension with CKD (chronic kidney disease) stage IV [facility])   [facility] [name], DO   1 month ago Benign hypertension with CKD (chronic kidney disease) stage IV [facility])   [place], [name], DO       Future Appointments             In 1 month Teodora Medici, [facility], [facility]

## 2022-02-03 MED FILL — Iron Sucrose Inj 20 MG/ML (Fe Equiv): INTRAVENOUS | Qty: 10 | Status: AC

## 2022-02-04 ENCOUNTER — Inpatient Hospital Stay: Payer: PPO

## 2022-02-04 ENCOUNTER — Other Ambulatory Visit (INDEPENDENT_AMBULATORY_CARE_PROVIDER_SITE_OTHER): Payer: Self-pay | Admitting: Vascular Surgery

## 2022-02-04 DIAGNOSIS — N185 Chronic kidney disease, stage 5: Secondary | ICD-10-CM

## 2022-02-09 ENCOUNTER — Telehealth: Payer: PPO

## 2022-02-10 ENCOUNTER — Other Ambulatory Visit (INDEPENDENT_AMBULATORY_CARE_PROVIDER_SITE_OTHER): Payer: PPO

## 2022-02-10 ENCOUNTER — Encounter (INDEPENDENT_AMBULATORY_CARE_PROVIDER_SITE_OTHER): Payer: PPO | Admitting: Vascular Surgery

## 2022-02-10 ENCOUNTER — Encounter (INDEPENDENT_AMBULATORY_CARE_PROVIDER_SITE_OTHER): Payer: PPO

## 2022-02-12 ENCOUNTER — Inpatient Hospital Stay: Payer: PPO

## 2022-02-16 ENCOUNTER — Other Ambulatory Visit: Payer: PPO

## 2022-02-24 ENCOUNTER — Other Ambulatory Visit: Payer: Self-pay | Admitting: Family Medicine

## 2022-02-24 DIAGNOSIS — I10 Essential (primary) hypertension: Secondary | ICD-10-CM

## 2022-02-24 DIAGNOSIS — M1A39X0 Chronic gout due to renal impairment, multiple sites, without tophus (tophi): Secondary | ICD-10-CM

## 2022-02-25 ENCOUNTER — Other Ambulatory Visit: Payer: Self-pay | Admitting: Family Medicine

## 2022-02-25 DIAGNOSIS — R0602 Shortness of breath: Secondary | ICD-10-CM

## 2022-02-27 ENCOUNTER — Inpatient Hospital Stay: Payer: PPO

## 2022-02-27 ENCOUNTER — Inpatient Hospital Stay: Payer: PPO | Attending: Oncology

## 2022-02-27 DIAGNOSIS — D631 Anemia in chronic kidney disease: Secondary | ICD-10-CM | POA: Diagnosis not present

## 2022-02-27 DIAGNOSIS — N184 Chronic kidney disease, stage 4 (severe): Secondary | ICD-10-CM | POA: Insufficient documentation

## 2022-02-27 DIAGNOSIS — D539 Nutritional anemia, unspecified: Secondary | ICD-10-CM

## 2022-02-27 DIAGNOSIS — Z7985 Long-term (current) use of injectable non-insulin antidiabetic drugs: Secondary | ICD-10-CM | POA: Diagnosis not present

## 2022-02-27 DIAGNOSIS — N186 End stage renal disease: Secondary | ICD-10-CM | POA: Diagnosis not present

## 2022-02-27 DIAGNOSIS — I120 Hypertensive chronic kidney disease with stage 5 chronic kidney disease or end stage renal disease: Secondary | ICD-10-CM | POA: Diagnosis not present

## 2022-02-27 DIAGNOSIS — M545 Low back pain, unspecified: Secondary | ICD-10-CM

## 2022-02-27 DIAGNOSIS — Z79899 Other long term (current) drug therapy: Secondary | ICD-10-CM | POA: Diagnosis not present

## 2022-02-27 DIAGNOSIS — Z992 Dependence on renal dialysis: Secondary | ICD-10-CM | POA: Insufficient documentation

## 2022-02-27 DIAGNOSIS — R0902 Hypoxemia: Secondary | ICD-10-CM | POA: Insufficient documentation

## 2022-02-27 LAB — IRON AND TIBC
Iron: 35 ug/dL — ABNORMAL LOW (ref 45–182)
Saturation Ratios: 9 % — ABNORMAL LOW (ref 17.9–39.5)
TIBC: 391 ug/dL (ref 250–450)
UIBC: 356 ug/dL

## 2022-02-27 LAB — CBC WITH DIFFERENTIAL/PLATELET
Abs Immature Granulocytes: 0.02 10*3/uL (ref 0.00–0.07)
Basophils Absolute: 0.1 10*3/uL (ref 0.0–0.1)
Basophils Relative: 1 %
Eosinophils Absolute: 0.1 10*3/uL (ref 0.0–0.5)
Eosinophils Relative: 2 %
HCT: 40.1 % (ref 39.0–52.0)
Hemoglobin: 12.1 g/dL — ABNORMAL LOW (ref 13.0–17.0)
Immature Granulocytes: 0 %
Lymphocytes Relative: 24 %
Lymphs Abs: 1.3 10*3/uL (ref 0.7–4.0)
MCH: 31.1 pg (ref 26.0–34.0)
MCHC: 30.2 g/dL (ref 30.0–36.0)
MCV: 103.1 fL — ABNORMAL HIGH (ref 80.0–100.0)
Monocytes Absolute: 0.4 10*3/uL (ref 0.1–1.0)
Monocytes Relative: 7 %
Neutro Abs: 3.4 10*3/uL (ref 1.7–7.7)
Neutrophils Relative %: 66 %
Platelets: 157 10*3/uL (ref 150–400)
RBC: 3.89 MIL/uL — ABNORMAL LOW (ref 4.22–5.81)
RDW: 19.1 % — ABNORMAL HIGH (ref 11.5–15.5)
WBC: 5.3 10*3/uL (ref 4.0–10.5)
nRBC: 0 % (ref 0.0–0.2)

## 2022-02-27 LAB — FERRITIN: Ferritin: 24 ng/mL (ref 24–336)

## 2022-02-27 LAB — VITAMIN B12: Vitamin B-12: 2462 pg/mL — ABNORMAL HIGH (ref 180–914)

## 2022-02-27 LAB — FOLATE: Folate: 18.6 ng/mL (ref >5.9)

## 2022-02-27 MED FILL — Iron Sucrose Inj 20 MG/ML (Fe Equiv): INTRAVENOUS | Qty: 10 | Status: AC

## 2022-03-02 ENCOUNTER — Inpatient Hospital Stay: Payer: PPO

## 2022-03-02 ENCOUNTER — Ambulatory Visit: Payer: PPO | Admitting: Nurse Practitioner

## 2022-03-02 ENCOUNTER — Inpatient Hospital Stay (HOSPITAL_BASED_OUTPATIENT_CLINIC_OR_DEPARTMENT_OTHER): Payer: PPO | Admitting: Oncology

## 2022-03-02 ENCOUNTER — Encounter: Payer: Self-pay | Admitting: Oncology

## 2022-03-02 VITALS — BP 148/84 | HR 85 | Temp 98.5°F | Resp 18

## 2022-03-02 DIAGNOSIS — R0902 Hypoxemia: Secondary | ICD-10-CM | POA: Diagnosis not present

## 2022-03-02 DIAGNOSIS — N184 Chronic kidney disease, stage 4 (severe): Secondary | ICD-10-CM

## 2022-03-02 DIAGNOSIS — D631 Anemia in chronic kidney disease: Secondary | ICD-10-CM
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
## 2022-03-02 NOTE — Assessment & Plan Note (Addendum)
Anemia is likely due to chronic kidney disease.  About other etiologies for Labs are reviewed and discussed with patient. Hemglobin has improved to 12 Iron saturation 9, ferritin 24 Recommend patient to take ferrous sulfate 325mg  BID

## 2022-03-02 NOTE — Assessment & Plan Note (Addendum)
Patient reports he is intermittent hypoxia. Suspect volume overload due to progressive kidney disease.  I recommend him to discuss with cardiologist and nephrology.

## 2022-03-09 ENCOUNTER — Inpatient Hospital Stay: Payer: PPO

## 2022-03-16 ENCOUNTER — Telehealth: Payer: Self-pay | Admitting: Pharmacist

## 2022-03-16 ENCOUNTER — Encounter: Payer: Self-pay | Admitting: Internal Medicine

## 2022-03-16 ENCOUNTER — Inpatient Hospital Stay: Payer: PPO

## 2022-03-16 ENCOUNTER — Ambulatory Visit (INDEPENDENT_AMBULATORY_CARE_PROVIDER_SITE_OTHER): Payer: PPO | Admitting: Internal Medicine

## 2022-03-16 VITALS — BP 124/78 | HR 88 | Temp 98.1°F | Resp 20 | Ht 71.0 in | Wt 345.7 lb

## 2022-03-16 DIAGNOSIS — I5022 Chronic systolic (congestive) heart failure: Secondary | ICD-10-CM | POA: Diagnosis not present

## 2022-03-16 DIAGNOSIS — E782 Mixed hyperlipidemia: Secondary | ICD-10-CM

## 2022-03-16 DIAGNOSIS — N184 Chronic kidney disease, stage 4 (severe): Secondary | ICD-10-CM | POA: Diagnosis not present

## 2022-03-16 DIAGNOSIS — L299 Pruritus, unspecified: Secondary | ICD-10-CM | POA: Diagnosis not present

## 2022-03-16 DIAGNOSIS — F339 Major depressive disorder, recurrent, unspecified: Secondary | ICD-10-CM | POA: Diagnosis not present

## 2022-03-16 DIAGNOSIS — E1165 Type 2 diabetes mellitus with hyperglycemia: Secondary | ICD-10-CM

## 2022-03-16 DIAGNOSIS — I129 Hypertensive chronic kidney disease with stage 1 through stage 4 chronic kidney disease, or unspecified chronic kidney disease: Secondary | ICD-10-CM

## 2022-03-16 DIAGNOSIS — Z1211 Encounter for screening for malignant neoplasm of colon: Secondary | ICD-10-CM

## 2022-03-16 MED ORDER — HYDROCORTISONE 1 % EX OINT: 1.0000 | TOPICAL_OINTMENT | Freq: Two times a day (BID) | CUTANEOUS | 0 refills | Status: DC

## 2022-03-16 MED ORDER — FLUOXETINE HCL 10 MG PO TABS: 10.0000 mg | ORAL_TABLET | Freq: Every day | ORAL | 1 refills | Status: DC

## 2022-03-16 NOTE — Progress Notes (Unsigned)
[name],  I see that [name] established care with you today. I previously worked with this patient to assist him with medication assistance for his Trulicity when he was seen by Dr. Parks Ranger at [facility]. Our team had started to engage with patient for re-enrollment in this program for the current calendar year, but had to stop once he was no longer a patient of [facility]. Would you like for our team to reach out to patient now to continue helping him with PAP re-enrollment?   Thank you!  Wallace Cullens, PharmD, [facility] [phone]

## 2022-03-16 NOTE — Patient Instructions (Signed)
It was great seeing you today!  Plan discussed at today's visit: -Blood work ordered today, results will be uploaded to [place] ordered -Start Prozac 10 mg daily -Steroid cream for itching prescribed -Consider pneumonia vaccine for next time  Follow up in: 6 weeks   Take care and let us know if you have any questions or concerns prior to your next visit.  [name]

## 2022-03-16 NOTE — Progress Notes (Signed)
New Patient Office Visit  Subjective    Patient ID: [name], male    DOB: 03-Jan-1966  Age: 57 y.o. MRN: [id number]  CC:  Chief Complaint  Patient presents with   Establish Care    HPI [name] presents to establish care.  Hypertension/HFpEF/Cardiomyopathy/Chronic Lymphedema: -Follows with Cardiology at [facility], last seen 11/19/21 -Medications: Coreg 25 mg BID, Bumex 2 mg TID, KCl 10 meQ, Metolazone PRN (hasn't taken in months) -Patient is compliant with above medications and reports no side effects. -Denies CP, vision changes -Last echo 10/23 EF 60-65%  -Had chronic lymphedema with leg wounds, these have healed. Uses compression stockings.   HLD: -Medications: Lipitor 20 mg -Patient is compliant with above medications and reports no side effects.  -Last lipid panel: Lipid Panel     Component Value Date/Time   CHOL 110 05/29/2021 0923   CHOL 105 01/17/2015 1120   CHOL 110 08/07/2014 0848   TRIG 73 05/29/2021 0923   TRIG 159 (H) 08/07/2014 0848   HDL 66 05/29/2021 0923   HDL 48 01/17/2015 1120   CHOLHDL 1.7 05/29/2021 0923   VLDL 11 08/06/2015 0937   VLDL 32 (H) 08/07/2014 0848   LDLCALC 29 05/29/2021 0923   LABVLDL 13 01/17/2015 1120   Diabetes, Type 2: -Last A1c 5/23 5.1% (had been on insulin, but this was discontinued at that time) -Medications: Trulicity A999333 mg weekly -Patient is compliant with the above medications and reports no side effects.  -Eye exam: Uncertain -Foot exam: Due -Microalbumin: Due -Statin: yes -PNA vaccine: Due, declines  -Denies symptoms of hypoglycemia, polyuria, polydipsia, numbness extremities, foot ulcers/trauma.   AsthmaAllergies:  -Asthma status: stable -Current Treatments: Albuterol PRN -Albuterol/rescue inhaler frequency: Rarely - states breathing issues are more due to allergies, specifically to talc or perfumes  -Pneumovax: Not up to Date -Influenza: Not up to Date  Gout: -Currently on Allopurinol 100 mg  BID  GERD: -Currently on Omprezole 40 mg, controlling symptoms   CKD5/AoCD/Secondary Hyperparathyroidism: -Last creatinine 5.05 11/23, GFR 13 -Had one dialysis session in the hospital in October where he left AMA -Following with Nephrology, note from 12/04/21 reviewed. Patient was supposed to see vascular for AV fistula placement but patient is not interested in dialysis  -Urinating several times a day per patient  -Following with Hematology, note and labs from 03/02/22 reviewed. Labs 03/02/22 hgb 12.1, ferritin 24 -Patient states he is currently on iron supplements, not on medication list and unable to verify  MDD: -Mood status: uncontrolled -Current treatment: Nothing  -Patient has been very stressed with his chronic medical conditions and overall health status. Patient's daughter is also living with him and causing some stress at home.     03/16/2022    9:54 AM 01/22/2022    8:22 AM 11/24/2021   11:02 AM 06/18/2021    1:44 PM 03/03/2021    8:11 AM  Depression screen PHQ 2/9  Decreased Interest 1 0 0 0 0  Down, Depressed, Hopeless 1 0 0 0 0  PHQ - 2 Score 2 0 0 0 0  Altered sleeping 0 0 0 0 0  Tired, decreased energy 0 0 0 0 0  Change in appetite 0 0 0 0 0  Feeling bad or failure about yourself  0 0 0 0 0  Trouble concentrating 0 0 0 0 0  Moving slowly or fidgety/restless 0 0 0 0 0  Suicidal thoughts 0 0 0 0 0  PHQ-9 Score 2 0 0 0 0  Difficult doing work/chores Not difficult at all Not difficult at all Not difficult at all Not difficult at all Not difficult at all   Health Maintenance: -Blood work due -Colon cancer screening due  Outpatient Encounter Medications as of 03/16/2022  Medication Sig   albuterol (PROVENTIL) (2.5 MG/3ML) 0.083% nebulizer solution Take 3 mLs (2.5 mg total) by nebulization every 6 (six) hours as needed for wheezing or shortness of breath.   albuterol (VENTOLIN HFA) 108 (90 Base) MCG/ACT inhaler Inhale 2 puffs into the lungs every 4 (four) hours as needed  for wheezing or shortness of breath.   allopurinol (ZYLOPRIM) 100 MG tablet Take 2 tablets (200 mg total) by mouth daily.   atorvastatin (LIPITOR) 20 MG tablet Take 1 tablet (20 mg total) by mouth daily.   blood glucose meter kit and supplies KIT Brand of choice; LON 99 months; check FSBS 3x a day; E11.65; disp one meter with 100 strips + 1 refill of strips   Blood Glucose Monitoring Suppl (ONE TOUCH ULTRA 2) w/Device KIT Use to check blood glucose up to 3 x daily   bumetanide (BUMEX) 1 MG tablet Take by mouth. Take 2 tablets (2 mg total) by mouth in the morning and 2 tablets (2 mg total) at noon and 2 tablets (2 mg total) in the evening.   calcitRIOL (ROCALTROL) 0.5 MCG capsule Take 2 capsules (1 mcg total) by mouth 1 (one) time each day   carvedilol (COREG) 25 MG tablet Take 2 tablets (50 mg total) by mouth 2 (two) times daily with a meal.   cyanocobalamin (VITAMIN B12) 1000 MCG tablet Take 1 tablet (1,000 mcg total) by mouth daily.   cyclobenzaprine (FLEXERIL) 10 MG tablet Take 1 tablet (10 mg total) by mouth 3 (three) times daily as needed for muscle spasms.   Dulaglutide (TRULICITY) A999333 0000000 SOPN INJECT 0.75MG (0.5ML) UNDER THE SKIN ONCE A WEEK   esomeprazole (NEXIUM) 40 MG capsule Take 1 capsule (40 mg total) by mouth 2 (two) times daily before a meal.   ferrous gluconate (FERGON) 324 MG tablet Take 1 tablet (324 mg total) by mouth daily with breakfast.   fluticasone (FLONASE) 50 MCG/ACT nasal spray Place 2 sprays into both nostrils daily. Use for 4-6 weeks then stop and use seasonally or as needed.   metolazone (ZAROXOLYN) 5 MG tablet Take 1 tablet (5 mg total) by mouth 1 (one) time each day if needed (for weight gain)   OneTouch Delica Lancets 99991111 MISC Use to check blood sugar 3 x daily   ONETOUCH ULTRA test strip Check blood sugar 3 x daily   oxyCODONE (OXY IR/ROXICODONE) 5 MG immediate release tablet Take 1 tablet (5 mg total) by mouth every 6 (six) hours as needed for severe pain.    potassium chloride (KLOR-CON) 10 MEQ tablet Take 1 tablet (10 mEq total) by mouth daily.   simethicone (MYLICON) 0000000 MG chewable tablet Chew 125 mg by mouth every 6 (six) hours as needed for flatulence.   No facility-administered encounter medications on file as of 03/16/2022.    Past Medical History:  Diagnosis Date   Allergy    Asthma    Diabetes mellitus without complication ([place])    Gout    Hyperlipidemia    Hypertension    Morbid obesity ([place])     Past Surgical History:  Procedure Laterality Date   TEMPORARY DIALYSIS CATHETER Right 11/14/2021   Procedure: TEMPORARY DIALYSIS CATHETER;  Surgeon: Algernon Huxley, MD;  Location: [place] [facility];  Service: Cardiovascular;  Laterality: Right;    Family History  Problem Relation Age of Onset   Diabetes Mother    Hypertension Mother    Alcohol abuse Father    Glaucoma Father    Diabetes Father    Hypertension Father    Cancer Neg Hx    COPD Neg Hx    Heart disease Neg Hx    Stroke Neg Hx     Social History   Socioeconomic History   Marital status: Divorced    Spouse name: Not on file   Number of children: Not on file   Years of education: Not on file   Highest education level: Not on file  Occupational History   Not on file  Tobacco Use   Smoking status: Never   Smokeless tobacco: Never  Vaping Use   Vaping Use: Never used  Substance and Sexual Activity   Alcohol use: Yes    Comment: a glass of wine and a couple beers a day   Drug use: No   Sexual activity: Not Currently  Other Topics Concern   Not on file  Social History Narrative   Not on file   Social Determinants of Health   Financial Resource Strain: Not on file  Food Insecurity: No Food Insecurity (11/12/2021)   Hunger Vital Sign    Worried About Running Out of Food in the Last Year: Never true    Ran Out of Food in the Last Year: Never true  Transportation Needs: No Transportation Needs (11/12/2021)   PRAPARE - Radiographer, therapeutic (Medical): No    Lack of Transportation (Non-Medical): No  Physical Activity: Not on file  Stress: Not on file  Social Connections: Not on file  Intimate Partner Violence: Not At Risk (11/12/2021)   Humiliation, Afraid, Rape, and Kick questionnaire    Fear of Current or Ex-Partner: No    Emotionally Abused: No    Physically Abused: No    Sexually Abused: No    Review of Systems  All other systems reviewed and are negative.       Objective    BP 124/78   Pulse 88   Temp 98.1 F (36.7 C)   Resp 20   Ht 5' 11"$  (1.803 m)   Wt (!) 345 lb 11.2 oz (156.8 kg)   SpO2 95%   BMI 48.22 kg/m   Physical Exam Constitutional:      Appearance: Normal appearance. He is obese.     Comments: Presents in wheelchair   HENT:     Head: Normocephalic and atraumatic.  Eyes:     Conjunctiva/sclera: Conjunctivae normal.  Cardiovascular:     Rate and Rhythm: Normal rate and regular rhythm.  Pulmonary:     Effort: Pulmonary effort is normal.     Breath sounds: Normal breath sounds.  Musculoskeletal:     Right lower leg: No edema.     Left lower leg: No edema.  Skin:    General: Skin is warm and dry.  Neurological:     General: No focal deficit present.     Mental Status: He is alert. Mental status is at baseline.  Psychiatric:        Mood and Affect: Mood normal.        Speech: Speech is tangential.        Behavior: Behavior is slowed.     Last CBC Lab Results  Component Value Date   WBC 5.3 02/27/2022  HGB 12.1 (L) 02/27/2022   HCT 40.1 02/27/2022   MCV 103.1 (H) 02/27/2022   MCH 31.1 02/27/2022   RDW 19.1 (H) 02/27/2022   PLT 157 123456   Last metabolic panel Lab Results  Component Value Date   GLUCOSE 84 12/07/2021   NA 140 12/07/2021   K 4.3 12/07/2021   CL 110 12/07/2021   CO2 26 12/07/2021   BUN 50 (H) 12/07/2021   CREATININE 4.44 (H) 12/07/2021   GFRNONAA 15 (L) 12/07/2021   CALCIUM 8.8 (L) 12/07/2021   PROT 5.8 (L) 11/20/2021    ALBUMIN 2.2 (L) 11/20/2021   LABGLOB 3.7 12/15/2021   AGRATIO 1.1 08/07/2014   BILITOT 1.9 (H) 11/20/2021   ALKPHOS 194 (H) 11/20/2021   AST 41 11/20/2021   ALT 30 11/20/2021   ANIONGAP 4 (L) 12/07/2021   Last lipids Lab Results  Component Value Date   CHOL 110 05/29/2021   HDL 66 05/29/2021   LDLCALC 29 05/29/2021   TRIG 73 05/29/2021   CHOLHDL 1.7 05/29/2021   Last hemoglobin A1c Lab Results  Component Value Date   HGBA1C 5.1 05/29/2021   Last thyroid functions Lab Results  Component Value Date   TSH 0.96 05/29/2021   Last vitamin D Lab Results  Component Value Date   VD25OH 30 08/06/2015   Last vitamin B12 and Folate Lab Results  Component Value Date   VITAMINB12 2,462 (H) 02/27/2022   FOLATE 18.6 02/27/2022        Assessment & Plan:   1. CKD (chronic kidney disease), stage IV [facility]): Nephrology note from 12/04/21 reviewed, patient needing dialysis but does not want to move forward. Will recheck kidney function and electrolytes. Patient very sleepy, but then also wanting medication for sleep. Discussed how this would not be wise with his history of end stage kidney disease.   - COMPLETE METABOLIC PANEL WITH GFR  2. Benign hypertension with CKD (chronic kidney disease) stage IV (HCC)/Chronic systolic heart failure ([place]): Blood pressure controlled here today. Currently on Coreg 25 mg BID, Bumex 2 mg TID, KCl 10 meQ. Again, will check kidneys function and electrolytes. Cardiology note from 11/19/21 reviewed but it does not sound like he is following with them regularly, states his heart was "fixed".   3. Mixed hyperlipidemia: Stable, continue Lipitor 20 mg.  4. Type 2 diabetes mellitus with hyperglycemia, without long-term current use of insulin ([place]): Last A1c well controlled back in May, had been on insulin but this was discontinued then. Only on Trulicity A999333 mg weekly. Will recheck A1c, urine microalbumin today.   - Urine Microalbumin w/creat. ratio - HgB  A1c  5. Episode of recurrent major depressive disorder, unspecified depression episode severity ([place]): Anxiety and depression worse in setting of worsening health. Start Prozac 10 mg daily, recheck in 6 weeks.   - FLUoxetine (PROZAC) 10 MG tablet; Take 1 tablet (10 mg total) by mouth daily.  Dispense: 30 tablet; Refill: 1  6. Itching: Generalized itching on arms and chest, patient unable to clarify further. Can use low hydrocortisone PRN.   - hydrocortisone 1 % ointment; Apply 1 Application topically 2 (two) times daily.  Dispense: 30 g; Refill: 0  7. Screening for colon cancer: Cologuard ordered.   - Cologuard   Return in about 6 weeks (around 04/27/2022).   Teodora Medici, DO

## 2022-03-16 NOTE — Addendum Note (Signed)
Addended by: Teodora Medici on: 03/16/2022 05:24 PM   Modules accepted: Orders

## 2022-03-16 NOTE — Progress Notes (Signed)
   Outreach Note  03/16/2022 Name: [name] MRN: [id number] DOB: [birth year]  Referred by: Teodora Medici, DO Reason for referral : No chief complaint on file.   Was unable to reach patient via telephone today and have left HIPAA compliant voicemail asking patient to return my call.    Follow Up Plan: Will collaborate with Care Guide to outreach to schedule follow up with me  Wallace Cullens, PharmD, [place] [phone]

## 2022-03-17 ENCOUNTER — Encounter: Payer: Self-pay | Admitting: Oncology

## 2022-03-17 ENCOUNTER — Other Ambulatory Visit: Payer: Self-pay | Admitting: Family Medicine

## 2022-03-17 ENCOUNTER — Telehealth: Payer: Self-pay | Admitting: Pharmacy Technician

## 2022-03-17 ENCOUNTER — Other Ambulatory Visit: Payer: Self-pay | Admitting: Internal Medicine

## 2022-03-17 DIAGNOSIS — M1A39X0 Chronic gout due to renal impairment, multiple sites, without tophus (tophi): Secondary | ICD-10-CM

## 2022-03-17 DIAGNOSIS — Z596 Low income: Secondary | ICD-10-CM

## 2022-03-17 LAB — COMPLETE METABOLIC PANEL WITH GFR
AG Ratio: 1 (calc) (ref 1.0–2.5)
ALT: 12 U/L (ref 9–46)
AST: 14 U/L (ref 10–35)
Albumin: 3.4 g/dL — ABNORMAL LOW (ref 3.6–5.1)
Alkaline phosphatase (APISO): 109 U/L (ref 35–144)
BUN/Creatinine Ratio: 19 (calc) (ref 6–22)
BUN: 55 mg/dL — ABNORMAL HIGH (ref 7–25)
CO2: 34 mmol/L — ABNORMAL HIGH (ref 20–32)
Calcium: 9.3 mg/dL (ref 8.6–10.3)
Chloride: 92 mmol/L — ABNORMAL LOW (ref 98–110)
Creat: 2.96 mg/dL — ABNORMAL HIGH (ref 0.70–1.30)
Globulin: 3.4 g/dL (calc) (ref 1.9–3.7)
Glucose, Bld: 122 mg/dL — ABNORMAL HIGH (ref 65–99)
Potassium: 3.8 mmol/L (ref 3.5–5.3)
Sodium: 141 mmol/L (ref 135–146)
Total Bilirubin: 0.7 mg/dL (ref 0.2–1.2)
Total Protein: 6.8 g/dL (ref 6.1–8.1)
eGFR: 24 mL/min/{1.73_m2} — ABNORMAL LOW (ref >=60)

## 2022-03-17 LAB — HEMOGLOBIN A1C
Hgb A1c MFr Bld: 6.2 % of total Hgb — ABNORMAL HIGH (ref <5.7)
Mean Plasma Glucose: 131 mg/dL
eAG (mmol/L): 7.3 mmol/L

## 2022-03-17 LAB — MICROALBUMIN / CREATININE URINE RATIO
Creatinine, Urine: 53 mg/dL (ref 20–320)
Microalb Creat Ratio: 638 mcg/mg creat — ABNORMAL HIGH (ref <30)
Microalb, Ur: 33.8 mg/dL

## 2022-03-17 NOTE — Progress Notes (Signed)
[facility])                                            [place] Team    03/17/2022  [name] February 19, 1965 [id number]  Received message from [name] that patient had established care with [name]. She informed to fax [name], MD part, to [name]. Faxed to [name] office today.  [name] P. [name], [name]  [phone]

## 2022-03-18 ENCOUNTER — Other Ambulatory Visit: Payer: PPO | Admitting: Pharmacist

## 2022-03-18 ENCOUNTER — Other Ambulatory Visit: Payer: Self-pay | Admitting: Internal Medicine

## 2022-03-18 ENCOUNTER — Telehealth: Payer: Self-pay | Admitting: Pharmacist

## 2022-03-18 DIAGNOSIS — M1A39X0 Chronic gout due to renal impairment, multiple sites, without tophus (tophi): Secondary | ICD-10-CM

## 2022-03-18 MED ORDER — ALLOPURINOL 100 MG PO TABS: 200.0000 mg | ORAL_TABLET | Freq: Every day | ORAL | 0 refills | Status: DC

## 2022-03-18 NOTE — Progress Notes (Signed)
[name],  Patient requesting new Rxs for glucometer and testing supplies as reports current glucometer not working. I believe that One Touch brand preferred for his HTA Medicare coverage.   Would you consider sending new Rxs for One Touch Verio, test strips and lancets to pharmacy for patient?  Thank you!  Wallace Cullens, PharmD, [facility] [phone]

## 2022-03-18 NOTE — Progress Notes (Signed)
03/18/2022 Name: [name] MRN: [id number] DOB: 06-26-1965  Chief Complaint  Patient presents with   Medication Assistance    [name] is a 57 y.o. year old male who presented for a telephone visit.   They were referred to the pharmacist by their PCP for assistance in managing medication access.    Subjective:  Care Team: Primary Care Provider: Teodora Medici, DO ; Next Scheduled Visit: 04/26/2021 Cardiologist: [facility] [place] Nephrologist: Murlean Iba, MD Vascular Specialist: Algernon Huxley, MD; Next Scheduled Visit: 03/20/2022 Hematologist: Earlie Server, MD; Next Scheduled Visit: 09/01/2022  Medication Access/Adherence  Current Pharmacy:  [place], [place] [address] Phone: [phone] Fax: [place], [address] [place] [address] Phone: [phone] Fax: [phone]   Patient reports affordability concerns with their medications: No  Patient reports access/transportation concerns to their pharmacy: No  Patient reports adherence concerns with their medications:  No     Diabetes:  Current medications: Trulicity A999333 mg weekly on Fridays Medications tried in the past: insulin glargine (hypoglycemia)  Current glucose readings: Denies monitoring home blood sugar recently as glucometer no longer working  Patient requests Rx for new glucometer and testing supplies  Current medication access support: Previously enrolled in patient assistance program for Trulicity from [employer] through 01/25/2022 - Collaborating with PCP and [place] to aid patient with application for re-enrollment in program for 2024 calendar year - Today reports has at least a 1 month supply of Trulicity remaining at home  Statin therapy: atorvastatin 20 mg daily  Objective:  Lab Results  Component Value Date    HGBA1C 6.2 (H) 03/16/2022    Lab Results  Component Value Date   CREATININE 2.96 (H) 03/16/2022   BUN 55 (H) 03/16/2022   NA 141 03/16/2022   K 3.8 03/16/2022   CL 92 (L) 03/16/2022   CO2 34 (H) 03/16/2022    Lab Results  Component Value Date   CHOL 110 05/29/2021   HDL 66 05/29/2021   LDLCALC 29 05/29/2021   TRIG 73 05/29/2021   CHOLHDL 1.7 05/29/2021    Medications     Reviewed by Teodora Medici, DO (Physician) on 03/16/22 at 1109  Med List Status: <None>   Medication Order Taking? Sig Documenting Provider Last Dose Status Informant  albuterol (PROVENTIL) (2.5 MG/3ML) 0.083% nebulizer solution [id number] Yes Take 3 mLs (2.5 mg total) by nebulization every 6 (six) hours as needed for wheezing or shortness of breath. [name], DO Taking Active   albuterol (VENTOLIN HFA) 108 (90 Base) MCG/ACT inhaler [id number] Yes Inhale 2 puffs into the lungs every 4 (four) hours as needed for wheezing or shortness of breath. [name], DO Taking Active   allopurinol (ZYLOPRIM) 100 MG tablet [id number] Yes Take 2 tablets (200 mg total) by mouth daily. [name], DO Taking Active   atorvastatin (LIPITOR) 20 MG tablet [id number] Yes Take 1 tablet (20 mg total) by mouth daily. [name], DO Taking Active   blood glucose meter kit and supplies KIT [id number] Yes Brand of choice; LON 99 months; check FSBS 3x a day; E11.65; disp one meter with 100 strips + 1 refill of strips [name], [name], MD Taking Active Self  Blood Glucose Monitoring Suppl (ONE TOUCH ULTRA 2) w/Device KIT [id number] Yes  Use to check blood glucose up to 3 x daily [name], DO Taking Active   bumetanide (BUMEX) 1 MG tablet [id number] Yes Take by mouth. Take 2 tablets (2 mg total) by mouth in the morning and 2 tablets (2 mg total) at noon and 2 tablets (2 mg total) in the evening. [provider] Taking Active   calcitRIOL (ROCALTROL) 0.5 MCG capsule  [id number] Yes Take 2 capsules (1 mcg total) by mouth 1 (one) time each day [provider] Taking Active   carvedilol (COREG) 25 MG tablet [id number] Yes Take 2 tablets (50 mg total) by mouth 2 (two) times daily with a meal. [place], [name], DO Taking Active   cyanocobalamin (VITAMIN B12) 1000 MCG tablet [id number] Yes Take 1 tablet (1,000 mcg total) by mouth daily. Earlie Server, MD Taking Active   cyclobenzaprine (FLEXERIL) 10 MG tablet [id number] Yes Take 1 tablet (10 mg total) by mouth 3 (three) times daily as needed for muscle spasms. [name], DO Taking Active   Dulaglutide (TRULICITY) A999333 0000000 Bonney Aid [id number] Yes INJECT 0.75MG (0.5ML) UNDER THE SKIN ONCE A WEEK [name], [name], DO Taking Active   esomeprazole (NEXIUM) 40 MG capsule [id number] Yes Take 1 capsule (40 mg total) by mouth 2 (two) times daily before a meal. [place], [name], DO Taking Active   ferrous gluconate (FERGON) 324 MG tablet [id number] Yes Take 1 tablet (324 mg total) by mouth daily with breakfast. Nena Polio, MD Taking Active   FLUoxetine (PROZAC) 10 MG tablet [id number] Yes Take 1 tablet (10 mg total) by mouth daily. Teodora Medici, DO  Active   fluticasone [facility]) 50 MCG/ACT nasal spray [id number] Yes Place 2 sprays into both nostrils daily. Use for 4-6 weeks then stop and use seasonally or as needed. [name], DO Taking Active   hydrocortisone 1 % ointment [id number] Yes Apply 1 Application topically 2 (two) times daily. Teodora Medici, DO  Active   metolazone (ZAROXOLYN) 5 MG tablet [id number] Yes Take 1 tablet (5 mg total) by mouth 1 (one) time each day if needed (for weight gain) [provider] Taking Active   OneTouch Delica Lancets 99991111 MISC [id number] Yes Use to check blood sugar 3 x daily [name], DO Taking Active   [facility] ULTRA test strip [id number] Yes Check blood sugar 3 x daily [name], DO Taking  Active   oxyCODONE (OXY IR/ROXICODONE) 5 MG immediate release tablet [id number] Yes Take 1 tablet (5 mg total) by mouth every 6 (six) hours as needed for severe pain. [name], DO Taking Active   potassium chloride (KLOR-CON) 10 MEQ tablet [id number] Yes Take 1 tablet (10 mEq total) by mouth daily. [name], DO Taking Active   simethicone (MYLICON) 0000000 MG chewable tablet [id number] Yes Chew 125 mg by mouth every 6 (six) hours as needed for flatulence. [provider] Taking Active               Assessment/Plan:   Encourage patient to schedule follow up appointments with both Cardiology and Nephrology  Diabetes: - Currently controlled - Send message to PCP to request provider send Rx for new glucometer and testing supplies to pharmacy for patient as patient requests - Recommend to keep log of blood sugar results when monitors at home, bring this record with him to upcoming medical appointments and to contact office sooner for readings outside of established parameters or symptoms - Will continue to collaborate with PCP and [facility]  CPhT for support to patient for re-enrollment application to [employer] for Trulicity patient assistance program   Follow Up Plan: Clinical Pharmacist will follow up with patient by telephone on 04/15/2022 at 8:30 am  Wallace Cullens, PharmD, [place] [phone]

## 2022-03-18 NOTE — Telephone Encounter (Signed)
Reordered today and sent to requesting pharmacy 03/18/22 #[id number]  Requested Prescriptions  Refused Prescriptions Disp Refills   allopurinol (ZYLOPRIM) 100 MG tablet 180 tablet 0    Sig: Take 2 tablets (200 mg total) by mouth daily.     Endocrinology:  Gout Agents - allopurinol Failed - 03/18/2022 10:43 AM      Failed - Cr in normal range and within 360 days    Creat  Date Value Ref Range Status  03/16/2022 2.96 (H) 0.70 - 1.30 mg/dL Final   Creatinine, POC  Date Value Ref Range Status  05/07/2015 0 mg/dL Final   Creatinine, Urine  Date Value Ref Range Status  03/16/2022 53 20 - 320 mg/dL Final         Passed - Uric Acid in normal range and within 360 days    Uric Acid, Serum  Date Value Ref Range Status  05/29/2021 6.3 4.0 - 8.0 mg/dL Final    Comment:    Therapeutic target for gout patients: <6.0 mg/dL .    Uric Acid  Date Value Ref Range Status  01/17/2015 6.0 3.7 - 8.6 mg/dL Final    Comment:               Therapeutic target for gout patients: <6.0         Passed - Valid encounter within last 12 months    Recent Outpatient Visits           2 days ago CKD (chronic kidney disease), stage IV [facility])   [facility] Teodora Medici, DO   1 month ago Chronic pain syndrome   [facility] [place], [name], DO   3 months ago Non-healing wound of left lower extremity   [place], DO   3 months ago Controlled type 2 diabetes mellitus with stage 4 chronic kidney disease, with long-term current use of insulin ([place])   [place], Grayland Ormond A, RPH-CPP   3 months ago Benign hypertension with CKD (chronic kidney disease) stage IV [facility])   [facility] [place], [name], DO       Future Appointments             In 1 month Teodora Medici, DO [facility] [place] within normal limits and completed in the last 12 months    WBC  Date Value Ref Range Status  02/27/2022 5.3 4.0 - 10.5 K/uL Final   RBC  Date Value Ref Range Status  02/27/2022 3.89 (L) 4.22 - 5.81 MIL/uL Final   Hemoglobin  Date Value Ref Range Status  02/27/2022 12.1 (L) 13.0 - 17.0 g/dL Final  01/17/2015 14.4 12.6 - 17.7 g/dL Final   HCT  Date Value Ref Range Status  02/27/2022 40.1 39.0 - 52.0 % Final   Hematocrit  Date Value Ref Range Status  01/17/2015 47.6 37.5 - 51.0 % Final   MCHC  Date Value Ref Range Status  02/27/2022 30.2 30.0 - 36.0 g/dL Final   [facility]  Date Value Ref Range Status  02/27/2022 31.1 26.0 - 34.0 pg Final   MCV  Date Value Ref Range Status  02/27/2022 103.1 (H) 80.0 - 100.0 fL Final  01/17/2015 91 79 - 97 fL Final   No results found for: "PLTCOUNTKUC", "LABPLAT", "POCPLA" RDW  Date Value Ref Range Status  02/27/2022 19.1 (H) 11.5 - 15.5 % Final  01/17/2015 15.7 (H) 12.3 - 15.4 % Final

## 2022-03-18 NOTE — Patient Instructions (Signed)
Goals Addressed             This Visit's Progress    Pharmacy Goals       Our goal A1c is less than 7%. This corresponds with fasting sugars less than 130 and 2 hour after meal sugars less than 180. Please check your blood sugar and keep a log of the results  Our goal bad cholesterol, or LDL, is less than 70 . This is why it is important to continue taking your atorvastatin.  Please check your daily weights as directed by your kidney doctor. Please keep a record of daily weights and bring this with you to your medical appointments  Please check your home blood pressure, keep a log of the results and bring this with you to your medical appointments.  Feel free to call me with any questions or concerns. I look forward to our next call!   Wallace Cullens, PharmD, [facility] [phone]

## 2022-03-18 NOTE — Telephone Encounter (Signed)
Medication Refill - Medication: allopurinol (ZYLOPRIM) 100 MG tablet   Has the patient contacted their pharmacy? No.  Pt switched PCP.   Preferred Pharmacy (with phone number or street name):  [place], [place] Phone: [phone]  Fax: [phone]     Has the patient been seen for an appointment in the last year OR does the patient have an upcoming appointment? Yes.    Agent: Please be advised that RX refills may take up to 3 business days. We ask that you follow-up with your pharmacy.

## 2022-03-19 ENCOUNTER — Telehealth: Payer: Self-pay | Admitting: Pharmacy Technician

## 2022-03-19 ENCOUNTER — Other Ambulatory Visit: Payer: Self-pay | Admitting: Internal Medicine

## 2022-03-19 DIAGNOSIS — E1122 Type 2 diabetes mellitus with diabetic chronic kidney disease: Secondary | ICD-10-CM

## 2022-03-19 DIAGNOSIS — Z596 Low income: Secondary | ICD-10-CM

## 2022-03-19 MED ORDER — ONETOUCH ULTRA VI STRP: ORAL_STRIP | 3 refills | Status: DC

## 2022-03-19 MED ORDER — ONETOUCH DELICA LANCETS 33G MISC: 3 refills | Status: DC

## 2022-03-19 MED ORDER — ONETOUCH ULTRA 2 W/DEVICE KIT: PACK | 0 refills | Status: DC

## 2022-03-19 NOTE — Progress Notes (Signed)
[facility])                                            [place] Team    03/19/2022  DEMAURY BARAHONA 20-Jun-1965 [id number]  Received both patient and provider portion(s) of patient assistance application(s) for [employer]. Faxed completed application and required documents into [employer].    Isabela Nardelli P. Kaeden Depaz, [place]  [phone]

## 2022-03-20 ENCOUNTER — Encounter (INDEPENDENT_AMBULATORY_CARE_PROVIDER_SITE_OTHER): Payer: PPO

## 2022-03-20 ENCOUNTER — Encounter (INDEPENDENT_AMBULATORY_CARE_PROVIDER_SITE_OTHER): Payer: PPO | Admitting: Vascular Surgery

## 2022-03-20 ENCOUNTER — Other Ambulatory Visit (INDEPENDENT_AMBULATORY_CARE_PROVIDER_SITE_OTHER): Payer: PPO

## 2022-03-21 ENCOUNTER — Encounter: Payer: Self-pay | Admitting: Oncology

## 2022-03-23 ENCOUNTER — Encounter: Payer: Self-pay | Admitting: Physician Assistant

## 2022-03-23 ENCOUNTER — Telehealth: Payer: PPO | Admitting: Physician Assistant

## 2022-03-23 ENCOUNTER — Ambulatory Visit: Payer: Self-pay

## 2022-03-23 ENCOUNTER — Ambulatory Visit: Payer: Self-pay | Admitting: *Deleted

## 2022-03-23 ENCOUNTER — Telehealth: Payer: Self-pay

## 2022-03-23 VITALS — Ht 71.75 in | Wt 345.0 lb

## 2022-03-23 NOTE — Telephone Encounter (Signed)
Contacted patient x3 to assist with getting him on his virtual visit with Talitha Givens, PA. Patient was confused at times incoherent with his speech. He stated that paramedics were at his residence at one time but he refused transport as he just wanted them to assess his vital signs. He stated he attempted to connect to virtual visit but he could not. He verbalized understanding when instructions were given but he could not connect. Erin advised he be seen in Urgent Care, ED or reschedule his appointment to be seen in person at his earliest convenience. He expressed understanding and front desk staff assisted in his reschedule. Patient was told to report to ED/Call 911 with any new or worsening symptoms in the meantime. He expressed understanding and verbalized awareness.

## 2022-03-23 NOTE — Progress Notes (Deleted)
   Virtual Visit via Video Note  I connected with [name] on 03/23/22 at  3:20 PM EST by a video enabled telemedicine application and verified that I am speaking with the correct person using two identifiers.  Location: Patient: At home  Provider: [place], [place]    I discussed the limitations of evaluation and management by telemedicine and the availability of in person appointments. The patient expressed understanding and agreed to proceed.  Today's Provider: Talitha Givens, MHS, PA-C Introduced myself to the patient as a PA-C and provided education on APPs in clinical practice.    Chief Complaint  Patient presents with   Diarrhea    X2 days    History of Present Illness:    Concern for Diarrhea  Onset: Duration: Frequency: Consistency: Blood or mucus: Recent travel: New foods or questionable foods: Fever:  Nausea: Vomiting:  Associated symptoms:  Sick contacts:   Interventions:     ROS    Observations/Objective:  Due to the nature of the virtual visit, physical exam and observations are limited. Able to obtain the following observations:   Alert, oriented, Appears comfortable, in no acute distress.  No scleral injection, no appreciated hoarseness, tachypnea, wheeze or strider. Able to maintain conversation without visible strain.  No cough appreciated during visit.   Assessment and Plan:   Follow Up Instructions:    I discussed the assessment and treatment plan with the patient. The patient was provided an opportunity to ask questions and all were answered. The patient agreed with the plan and demonstrated an understanding of the instructions.   The patient was advised to call back or seek an in-person evaluation if the symptoms worsen or if the condition fails to improve as anticipated.  I provided *** minutes of non-face-to-face time during this encounter.   Sean Malinowski E [place] Mallozzi, PA-C

## 2022-03-23 NOTE — Telephone Encounter (Signed)
Summary: Pt reports that he is experiencing severe diarrhea and he can not even drink water.   Pt reports that he is experiencing severe diarrhea and he can not even drink water. Pt also stated that he has heart problems so he really needs to discuss getting an antibiotic. Cb# [phone]          Left message to call back about symptoms.

## 2022-03-23 NOTE — Telephone Encounter (Signed)
Summary: Pt reports that he is experiencing severe diarrhea and he can not even drink water.   Pt reports that he is experiencing severe diarrhea and he can not even drink water. Pt also stated that he has heart problems so he really needs to discuss getting an antibiotic. Cb# [phone]      Called pt. Left message to call back.

## 2022-03-23 NOTE — Telephone Encounter (Signed)
  Summary: Pt reports that he is experiencing severe diarrhea and he can not even drink water.     Pt reports that he is experiencing severe diarrhea and he can not even drink water. Pt also stated that he has heart problems so he really needs to discuss getting an antibiotic. Cb# [phone]          Chief Complaint: Diarrhea Symptoms: Diarrhea, "Little bit just with water, not much but comes right out."  >7  X's daily. Haven't eaten." Frequency: Yesterday Pertinent Negatives: Patient denies Vomiting, fever, No s/s dehydration, urinating as usual, not dark, denies dizziness Disposition: []$ ED /[]$ Urgent Care (no appt availability in office) / []$ Appointment(In office/virtual)/ []$  [facility]/ []$ Home Care/ []$ Refused Recommended Disposition /[]$ [place] [facility]/ [x]$  Follow-up with PCP Additional Notes: Offered appt, states can't get to office. States "Last time I had this they gave me antibiotics." Advised appt needed, declines. Home care advise provided,verbalizes understanding. Please advise   Reason for Disposition  [1] SEVERE diarrhea (e.g., 7 or more times / day more than normal) AND [2] present > 24 hours (1 day)  SEVERE diarrhea (e.g., 7 or more times / day more than normal)  Answer Assessment - Initial Assessment Questions 1. DIARRHEA SEVERITY: "How bad is the diarrhea?" "How many more stools have you had in the past 24 hours than normal?"    - NO DIARRHEA (SCALE 0)   - MILD (SCALE 1-3): Few loose or mushy BMs; increase of 1-3 stools over normal daily number of stools; mild increase in ostomy output.   -  MODERATE (SCALE 4-7): Increase of 4-6 stools daily over normal; moderate increase in ostomy output.   -  SEVERE (SCALE 8-10; OR "WORST POSSIBLE"): Increase of 7 or more stools daily over normal; moderate increase in ostomy output; incontinence.      2. ONSET: "When did the diarrhea begin?"      Yesterday 3. BM CONSISTENCY: "How loose or watery is the diarrhea?"       Both loose and watery 4. VOMITING: "Are you also vomiting?" If Yes, ask: "How many times in the past 24 hours?"      No 5. ABDOMEN PAIN: "Are you having any abdomen pain?" If Yes, ask: "What does it feel like?" (e.g., crampy, dull, intermittent, constant)      "Little bit " 6. ABDOMEN PAIN SEVERITY: If present, ask: "How bad is the pain?"  (e.g., Scale 1-10; mild, moderate, or severe)   - MILD (1-3): doesn't interfere with normal activities, abdomen soft and not tender to touch    - MODERATE (4-7): interferes with normal activities or awakens from sleep, abdomen tender to touch    - SEVERE (8-10): excruciating pain, doubled over, unable to do any normal activities       severe 7. ORAL INTAKE: If vomiting, "Have you been able to drink liquids?" "How much liquids have you had in the past 24 hours?"     NA 8. HYDRATION: "Any signs of dehydration?" (e.g., dry mouth [not just dry lips], too weak to stand, dizziness, new weight loss) "When did you last urinate?"     No 10. ANTIBIOTIC USE: "Are you taking antibiotics now or have you taken antibiotics in the past 2 months?"       3-4 months ago  or longer 11. OTHER SYMPTOMS: "Do you have any other symptoms?" (e.g., fever, blood in stool)       No  Protocols used: Diarrhea-A-AH

## 2022-03-23 NOTE — Telephone Encounter (Signed)
Left message to call back about symptoms. °

## 2022-03-23 NOTE — Telephone Encounter (Signed)
Patient called states started having diarrhea yesterday. Advised [name] does not just give antibiotics, I stated he will need to give a couple of days stay hydrated and eat bland foods if no better will need an appt.

## 2022-03-24 ENCOUNTER — Ambulatory Visit (INDEPENDENT_AMBULATORY_CARE_PROVIDER_SITE_OTHER): Payer: PPO | Admitting: Physician Assistant

## 2022-03-24 ENCOUNTER — Other Ambulatory Visit: Payer: Self-pay

## 2022-03-24 ENCOUNTER — Encounter: Payer: Self-pay | Admitting: Physician Assistant

## 2022-03-24 ENCOUNTER — Emergency Department
Admission: EM | Admit: 2022-03-24 | Discharge: 2022-03-24 | Disposition: A | Discharge status: Home / Self Care | Payer: PPO | Attending: Emergency Medicine | Admitting: Emergency Medicine

## 2022-03-24 VITALS — BP 120/78 | HR 86 | Temp 98.0°F | Resp 20 | Ht 71.75 in | Wt 345.0 lb

## 2022-03-24 DIAGNOSIS — R0902 Hypoxemia: Secondary | ICD-10-CM | POA: Diagnosis not present

## 2022-03-24 DIAGNOSIS — I10 Essential (primary) hypertension: Secondary | ICD-10-CM | POA: Insufficient documentation

## 2022-03-24 DIAGNOSIS — E119 Type 2 diabetes mellitus without complications: Secondary | ICD-10-CM | POA: Insufficient documentation

## 2022-03-24 DIAGNOSIS — R531 Weakness: Secondary | ICD-10-CM

## 2022-03-24 DIAGNOSIS — R0602 Shortness of breath: Secondary | ICD-10-CM | POA: Diagnosis not present

## 2022-03-24 DIAGNOSIS — Z9189 Other specified personal risk factors, not elsewhere classified: Secondary | ICD-10-CM | POA: Diagnosis not present

## 2022-03-24 DIAGNOSIS — R197 Diarrhea, unspecified: Secondary | ICD-10-CM | POA: Diagnosis not present

## 2022-03-24 LAB — URINALYSIS, ROUTINE W REFLEX MICROSCOPIC
Bacteria, UA: NONE SEEN
Bilirubin Urine: NEGATIVE
Glucose, UA: NEGATIVE mg/dL
Hgb urine dipstick: NEGATIVE
Ketones, ur: NEGATIVE mg/dL
Leukocytes,Ua: NEGATIVE
Nitrite: NEGATIVE
Protein, ur: 100 mg/dL — AB
Specific Gravity, Urine: 1.012 (ref 1.005–1.030)
pH: 6 (ref 5.0–8.0)

## 2022-03-24 LAB — COMPREHENSIVE METABOLIC PANEL
ALT: 16 U/L (ref 0–44)
AST: 19 U/L (ref 15–41)
Albumin: 3 g/dL — ABNORMAL LOW (ref 3.5–5.0)
Alkaline Phosphatase: 97 U/L (ref 38–126)
Anion gap: 9 (ref 5–15)
BUN: 52 mg/dL — ABNORMAL HIGH (ref 6–20)
CO2: 36 mmol/L — ABNORMAL HIGH (ref 22–32)
Calcium: 8.9 mg/dL (ref 8.9–10.3)
Chloride: 90 mmol/L — ABNORMAL LOW (ref 98–111)
Creatinine, Ser: 2.78 mg/dL — ABNORMAL HIGH (ref 0.61–1.24)
GFR, Estimated: 26 mL/min — ABNORMAL LOW (ref >60)
Glucose, Bld: 116 mg/dL — ABNORMAL HIGH (ref 70–99)
Potassium: 3.2 mmol/L — ABNORMAL LOW (ref 3.5–5.1)
Sodium: 135 mmol/L (ref 135–145)
Total Bilirubin: 1.5 mg/dL — ABNORMAL HIGH (ref 0.3–1.2)
Total Protein: 6.8 g/dL (ref 6.5–8.1)

## 2022-03-24 LAB — CBC
HCT: 45.9 % (ref 39.0–52.0)
Hemoglobin: 13.1 g/dL (ref 13.0–17.0)
MCH: 29.4 pg (ref 26.0–34.0)
MCHC: 28.5 g/dL — ABNORMAL LOW (ref 30.0–36.0)
MCV: 103.1 fL — ABNORMAL HIGH (ref 80.0–100.0)
Platelets: 125 10*3/uL — ABNORMAL LOW (ref 150–400)
RBC: 4.45 MIL/uL (ref 4.22–5.81)
RDW: 17.2 % — ABNORMAL HIGH (ref 11.5–15.5)
WBC: 5.7 10*3/uL (ref 4.0–10.5)
nRBC: 0 % (ref 0.0–0.2)

## 2022-03-24 LAB — LIPASE, BLOOD: Lipase: 36 U/L (ref 11–51)

## 2022-03-24 NOTE — ED Provider Notes (Signed)
[facility] Provider Note    Event Date/Time   First MD Initiated Contact with Patient 03/24/22 1227     (approximate)   History   Diarrhea   HPI  [name] is a 57 y.o. male with history of diabetes, hypertension presents with complaints of diarrhea.  Patient reports that he has had a few days of diarrhea but otherwise feels fine.  He reports last year he had several weeks of diarrhea which left him in the hospital, he is trying to prevent that.  He does not want to become dehydrated.  Denies abdominal pain.  No vomiting.  No fevers.  Reports last night he tolerated beer     Physical Exam   Triage Vital Signs: ED Triage Vitals  Enc Vitals Group     BP 03/24/22 1158 131/69     Pulse Rate 03/24/22 1158 86     Resp 03/24/22 1158 19     Temp 03/24/22 1158 98.6 F (37 C)     Temp Source 03/24/22 1158 Oral     SpO2 03/24/22 1158 96 %     Weight 03/24/22 1159 (!) 147.9 kg (326 lb)     Height 03/24/22 1159 1.803 m ('5\' 11"'$ )     Head Circumference --      Peak Flow --      Pain Score 03/24/22 1159 0     Pain Loc --      Pain Edu? --      Excl. in [place]? --     Most recent vital signs: Vitals:   03/24/22 1158 03/24/22 1200  BP: 131/69   Pulse: 86   Resp: 19   Temp: 98.6 F (37 C)   SpO2: 96% 97%     General: Awake, no distress.  CV:  Good peripheral perfusion.  Resp:  Normal effort.  Abd:  No distention.  Soft, nontender Other:     ED Results / Procedures / Treatments   Labs (all labs ordered are listed, but only abnormal results are displayed) Labs Reviewed  COMPREHENSIVE METABOLIC PANEL - Abnormal; Notable for the following components:      Result Value   Potassium 3.2 (*)    Chloride 90 (*)    CO2 36 (*)    Glucose, Bld 116 (*)    BUN 52 (*)    Creatinine, Ser 2.78 (*)    Albumin 3.0 (*)    Total Bilirubin 1.5 (*)    GFR, Estimated 26 (*)    All other components within normal limits  CBC - Abnormal; Notable for the  following components:   MCV 103.1 (*)    MCHC 28.5 (*)    RDW 17.2 (*)    Platelets 125 (*)    All other components within normal limits  URINALYSIS, ROUTINE W REFLEX MICROSCOPIC - Abnormal; Notable for the following components:   Color, Urine YELLOW (*)    APPearance CLEAR (*)    Protein, ur 100 (*)    All other components within normal limits  LIPASE, BLOOD     EKG     RADIOLOGY     PROCEDURES:  Critical Care performed:   Procedures   MEDICATIONS ORDERED IN ED: Medications - No data to display   IMPRESSION / MDM / [place] / ED COURSE  I reviewed the triage vital signs and the nursing notes. Patient's presentation is most consistent with acute illness / injury with system symptoms.   Patient presents with  diarrhea as above, he is well-appearing with no abdominal tenderness or pain.  Suspect mild colitis.  No vomiting.  Lab work reviewed, mostly improved from labs seen from 8 days ago, no evidence of significant dehydration, reassuring exam.  No indication for IV fluid hydration at this time, prep for discharge with outpatient follow-up       FINAL CLINICAL IMPRESSION(S) / ED DIAGNOSES   Final diagnoses:  Diarrhea, unspecified type     Rx / DC Orders   ED Discharge Orders     None        Note:  This document was prepared using Dragon voice recognition software and may include unintentional dictation errors.   Lavonia Drafts, MD 03/24/22 [phone]

## 2022-03-24 NOTE — Assessment & Plan Note (Addendum)
Acute, new concern Patient reports diarrhea comprised of liquid bowel movements several times per hour since Sun  He reports he has not eaten since Sunday and his liquid intake has been reduced due to such frequent bowel movements  He reports some relief today after taking an Imodium and is requesting abx saying "that usually clears this up" His symptoms seem more consistent with viral GI enteritis at this time and I discussed the difference in bacterial vs viral GI illness as well as recommended therapies He has not tried to partake in bland diet yet and states he has only been drinking about a cup of water per day I am concerned for dehydration and weakness as patient had to use a wheelchair to enter building despite previously being ambulatory  We discussed his ability to tolerate PO intake and maintain adequate hydration but I am skeptical of his ability to do so at this time We discussed going to the ED for evaluation and potential IV fluids which he was amenable to doing  EMS was called and transported patient to the ED

## 2022-03-24 NOTE — Assessment & Plan Note (Signed)
Acute, recurrent concern Patient presented to office for other concern but during vitals his O2 sat was ~85  1L O2 via nasal cannula was applied and patient was repeatedly reminded to take deep breaths through the nose to facilitate oxygen saturation - he did not comply with these instructions. Patient was able to participate in conversation without obvious signs of pulmonary strain or increased work of breathing Patient was not able to give definitive answers regarding his use of his inhalers  and rescue therapies. From his response it sounds as though his last rescue treatment was about 2 weeks ago PE was concerning for decreased lung sounds in the right lower lobes  He does have significant history of CHF, CKD stage 4 and episodes of volume overload We discussed his overall condition and I recommended he go to the ED for evaluation and stabilization He agreed and EMS was called for transport

## 2022-03-24 NOTE — ED Triage Notes (Signed)
Pt arrives via [facility] w/ c/o diarrhea x3 days. Pt arrives on 6L [place], states he doesn't wear oxygen at home, room air saturation 96%.

## 2022-03-24 NOTE — ED Notes (Signed)
First nurse note: Pt arrives via EMS from doc office. O2 80s on ems arrival and put on 3L. Reports decreased appetite.  160/100

## 2022-03-24 NOTE — Progress Notes (Signed)
Acute Office Visit   Patient: [name]   DOB: 28-Sep-1965   57 y.o. Male  MRN: [id number] Visit Date: 03/24/2022  Today's healthcare provider: Dani Gobble Hilery Wintle, PA-C  Introduced myself to the patient as a Journalist, newspaper and provided education on APPs in clinical practice.    Chief Complaint  Patient presents with   Cough   Diarrhea    X3 days   Subjective    HPI HPI     Diarrhea    Additional comments: X3 days      Last edited by Royal Hawthorn, CMA on 03/24/2022 10:28 AM.       Concern for Diarrhea   Onset: sudden  Duration: Sunday  Frequency: several times per hour  Reports any PO intake seems to trigger diarrhea  Consistency: liquid  Blood or mucus: none  Recent travel: unsure - patient responses are tangential and scattered  New foods or questionable foods: Reports he ate something from [facility] and this was the last thing he ate before his symptoms started  Fever: denies fever  Nausea: yes, last night  Vomiting: no but reports excess mucus in sinuses - he reports he is struggling to get phlegm up  Reports he has been able to take some of  his medications but reports eating is limited  Associated symptoms: unsure    Sick contacts: none    Interventions: has taken Imodium today - reports some relief today  He reports he has not tried to eat since Sunday and has only been able to drink about a cup of water per day     Hypoxia  Onset: unsure Duration: patient not able to provide discrete answer We discussed his use of his daily and rescue inhalers- it sounds as though he has not used a nebulizer or rescue treatment since last week or maybe the week before that- patient's answers are tangential and scattered He continues to report increased phlegm in his throat and is trying to cough and clear it in the office  He reports his oxygen is always low- in the low 90s - but then requested an explanation of what his blood oxygen was and what could be making it low      Medications: Outpatient Medications Prior to Visit  Medication Sig   albuterol (PROVENTIL) (2.5 MG/3ML) 0.083% nebulizer solution Take 3 mLs (2.5 mg total) by nebulization every 6 (six) hours as needed for wheezing or shortness of breath.   albuterol (VENTOLIN HFA) 108 (90 Base) MCG/ACT inhaler Inhale 2 puffs into the lungs every 4 (four) hours as needed for wheezing or shortness of breath.   allopurinol (ZYLOPRIM) 100 MG tablet Take 2 tablets (200 mg total) by mouth daily.   atorvastatin (LIPITOR) 20 MG tablet Take 1 tablet (20 mg total) by mouth daily.   blood glucose meter kit and supplies KIT Brand of choice; LON 99 months; check FSBS 3x a day; E11.65; disp one meter with 100 strips + 1 refill of strips   Blood Glucose Monitoring Suppl (ONE TOUCH ULTRA 2) w/Device KIT Use to check blood glucose up to 3 x daily   bumetanide (BUMEX) 1 MG tablet Take by mouth. Take 2 tablets (2 mg total) by mouth in the morning and 2 tablets (2 mg total) at noon and 2 tablets (2 mg total) in the evening.   calcitRIOL (ROCALTROL) 0.5 MCG capsule Take 2 capsules (1 mcg total) by mouth 1 (one) time each  day   carvedilol (COREG) 25 MG tablet Take 2 tablets (50 mg total) by mouth 2 (two) times daily with a meal.   cyanocobalamin (VITAMIN B12) 1000 MCG tablet Take 1 tablet (1,000 mcg total) by mouth daily.   cyclobenzaprine (FLEXERIL) 10 MG tablet Take 1 tablet (10 mg total) by mouth 3 (three) times daily as needed for muscle spasms.   Dulaglutide (TRULICITY) A999333 0000000 SOPN INJECT 0.'75MG'$  (0.5ML) UNDER THE SKIN ONCE A WEEK   esomeprazole (NEXIUM) 40 MG capsule Take 1 capsule (40 mg total) by mouth 2 (two) times daily before a meal.   ferrous gluconate (FERGON) 324 MG tablet Take 1 tablet (324 mg total) by mouth daily with breakfast.   FLUoxetine (PROZAC) 10 MG capsule Take 10 mg by mouth daily.   FLUoxetine (PROZAC) 10 MG tablet Take 1 tablet (10 mg total) by mouth daily.   fluticasone (FLONASE) 50 MCG/ACT  nasal spray Place 2 sprays into both nostrils daily. Use for 4-6 weeks then stop and use seasonally or as needed.   hydrocortisone 1 % ointment Apply 1 Application topically 2 (two) times daily.   metolazone (ZAROXOLYN) 5 MG tablet Take 1 tablet (5 mg total) by mouth 1 (one) time each day if needed (for weight gain)   OneTouch Delica Lancets 99991111 MISC Use to check blood sugar 3 x daily   ONETOUCH ULTRA test strip Check blood sugar 3 x daily   oxyCODONE (OXY IR/ROXICODONE) 5 MG immediate release tablet Take 1 tablet (5 mg total) by mouth every 6 (six) hours as needed for severe pain.   potassium chloride (KLOR-CON) 10 MEQ tablet Take 1 tablet (10 mEq total) by mouth daily.   simethicone (MYLICON) 0000000 MG chewable tablet Chew 125 mg by mouth every 6 (six) hours as needed for flatulence.   No facility-administered medications prior to visit.    Review of Systems  Constitutional:  Negative for chills and fever.  HENT:  Positive for postnasal drip and rhinorrhea.   Respiratory:  Positive for cough. Negative for chest tightness, shortness of breath and wheezing.   Gastrointestinal:  Positive for diarrhea and nausea. Negative for abdominal pain, blood in stool and vomiting.  Musculoskeletal:  Positive for arthralgias.       Objective    BP 120/78 (BP Location: Left Arm, Patient Position: Sitting, Cuff Size: Large)   Pulse 86   Temp 98 F (36.7 C) (Oral)   Resp 20   Ht 5' 11.75" (1.822 m) Comment: per patient  Wt (!) 345 lb (156.5 kg) Comment: per chart  SpO2 94% Comment: 1L o2  BMI 47.12 kg/m    Physical Exam Vitals reviewed.  Constitutional:      General: He is awake.     Appearance: Normal appearance. He is well-developed and well-groomed.  HENT:     Head: Normocephalic and atraumatic.     Mouth/Throat:     Lips: Pink.     Mouth: Mucous membranes are moist.  Eyes:     General: Lids are normal. Gaze aligned appropriately. Scleral icterus present.     Extraocular Movements:  Extraocular movements intact.  Cardiovascular:     Rate and Rhythm: Normal rate and regular rhythm.     Heart sounds: Normal heart sounds.  Pulmonary:     Effort: Pulmonary effort is normal.     Breath sounds: Decreased air movement present. Examination of the right-middle field reveals decreased breath sounds. Examination of the right-lower field reveals decreased breath sounds. Decreased breath sounds present.  No wheezing, rhonchi or rales.  Neurological:     Mental Status: He is alert.     GCS: GCS eye subscore is 4. GCS verbal subscore is 5. GCS motor subscore is 6.  Psychiatric:        Attention and Perception: He is inattentive.        Mood and Affect: Mood and affect normal.        Speech: Speech is tangential.        Behavior: Behavior normal. Behavior is cooperative.       No results found for any visits on 03/24/22.  Assessment & Plan      No follow-ups on file.      Problem List Items Addressed This Visit       Respiratory   Hypoxia    Acute, recurrent concern Patient presented to office for other concern but during vitals his O2 sat was ~85  1L O2 via nasal cannula was applied and patient was repeatedly reminded to take deep breaths through the nose to facilitate oxygen saturation - he did not comply with these instructions. Patient was able to participate in conversation without obvious signs of pulmonary strain or increased work of breathing Patient was not able to give definitive answers regarding his use of his inhalers  and rescue therapies. From his response it sounds as though his last rescue treatment was about 2 weeks ago PE was concerning for decreased lung sounds in the right lower lobes  He does have significant history of CHF, CKD stage 4 and episodes of volume overload We discussed his overall condition and I recommended he go to the ED for evaluation and stabilization He agreed and EMS was called for transport         Other   Diarrhea - Primary     Acute, new concern Patient reports diarrhea comprised of liquid bowel movements several times per hour since Sun  He reports he has not eaten since Sunday and his liquid intake has been reduced due to such frequent bowel movements  He reports some relief today after taking an Imodium and is requesting abx saying "that usually clears this up" His symptoms seem more consistent with viral GI enteritis at this time and I discussed the difference in bacterial vs viral GI illness as well as recommended therapies He has not tried to partake in bland diet yet and states he has only been drinking about a cup of water per day I am concerned for dehydration and weakness as patient had to use a wheelchair to enter building despite previously being ambulatory  We discussed his ability to tolerate PO intake and maintain adequate hydration but I am skeptical of his ability to do so at this time We discussed going to the ED for evaluation and potential IV fluids which he was amenable to doing  EMS was called and transported patient to the ED       Other Visit Diagnoses     Weakness generalized     Acute, likely secondary to current GI illness He reports limited food intake over the last 3 days and states he has only been able to tolerate about 1 cup of water per day despite persistent diarrhea  Patient had to be assisted out of car and use a wheelchair to enter building  Recommend ED evaluation and potential fluid management for stabilization     At risk for dehydration due to poor fluid intake     Acute, likely secondary  to current GI illness He reports limited food intake over the last 3 days and states he has only been able to tolerate about 1 cup of water per day despite persistent diarrhea  I am skeptical that he can safely maintain fluid status and engage with bland diet given his presentation in office today  Recommend ED evaluation and fluid hydration via IV at minimum for stabilization          No follow-ups on file.   I, Eldor Conaway E Kiaraliz Rafuse, PA-C, have reviewed all documentation for this visit. The documentation on 03/24/22 for the exam, diagnosis, procedures, and orders are all accurate and complete.   Talitha Givens, MHS, PA-C [facility]

## 2022-03-24 NOTE — Progress Notes (Signed)
Patient was unable to successfully connect to video visit despite multiple attempts with office staff to trouble-shoot and sending several links Patient was offered in office apt for next day and instructed to seek urgent care or ED for more severe symptoms

## 2022-03-27 ENCOUNTER — Encounter: Payer: Self-pay | Admitting: *Deleted

## 2022-03-27 ENCOUNTER — Telehealth: Payer: Self-pay | Admitting: Pharmacy Technician

## 2022-03-27 ENCOUNTER — Ambulatory Visit: Payer: Self-pay | Admitting: *Deleted

## 2022-03-27 DIAGNOSIS — E1169 Type 2 diabetes mellitus with other specified complication: Secondary | ICD-10-CM

## 2022-03-27 DIAGNOSIS — Z596 Low income: Secondary | ICD-10-CM

## 2022-03-27 NOTE — Progress Notes (Signed)
[facility])                                            [place] Team    03/27/2022  [place] [birth year] [id number]  Care coordination call placed to [place] in regard to Trulicity application.  Spoke to [place] who informs patient is APPROVED 03/18/22-01/26/23. Medication will auto fill and ship to patient's home address on the application based on last fill date in 2023 and going forward in 2024. If patient feels as he does not have adequate supply, then he can call [facility] at [phone] or [place], Donita Brooks, at [phone].  Dilia Alemany P. Briggs Edelen, [place]  [phone]
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
## 2022-03-27 NOTE — Patient Instructions (Signed)
Visit Information  Thank you for taking time to visit with me today. Please don't hesitate to contact me if I can be of assistance to you before our next scheduled telephone appointment.  Following are the goals we discussed today:  Listen for call from community resource care guide regarding pending eviction. Continue monitoring blood pressure and blood sugar daily. Keep appointment next week for vascular doctor.  Follow up with PCP regarding request for podiatry referral.   Our next appointment is by telephone on 4/3  Please call the care guide team at [phone] if you need to cancel or reschedule your appointment.   Please call the Suicide and Crisis Lifeline: 988 call the [place] National Suicide Prevention Lifeline: (310)164-7796 or TTY: 8073263748 TTY 609-119-1679) to talk to a trained counselor call 1-800-273-TALK (toll free, 24 hour hotline) call 911 if you are experiencing a Mental Health or [place] or need someone to talk to.  Patient verbalizes understanding of instructions and care plan provided today and agrees to view in [facility]. Active MyChart status and patient understanding of how to access instructions and care plan via MyChart confirmed with patient.     The patient has been provided with contact information for the care management team and has been advised to call with any health related questions or concerns.   Valente David, RN, MSN, [place] Care Management Care Management Coordinator [phone]

## 2022-03-29 ENCOUNTER — Other Ambulatory Visit: Payer: Self-pay | Admitting: Internal Medicine

## 2022-03-29 DIAGNOSIS — G629 Polyneuropathy, unspecified: Secondary | ICD-10-CM

## 2022-03-30 ENCOUNTER — Ambulatory Visit (INDEPENDENT_AMBULATORY_CARE_PROVIDER_SITE_OTHER): Payer: PPO | Admitting: Physician Assistant

## 2022-03-30 ENCOUNTER — Ambulatory Visit: Payer: Self-pay | Admitting: *Deleted

## 2022-03-30 ENCOUNTER — Telehealth: Payer: Self-pay

## 2022-03-30 ENCOUNTER — Encounter: Payer: Self-pay | Admitting: Physician Assistant

## 2022-03-30 VITALS — BP 136/82 | HR 77 | Temp 98.0°F | Resp 16 | Ht 71.75 in | Wt 345.0 lb

## 2022-03-30 DIAGNOSIS — R2242 Localized swelling, mass and lump, left lower limb: Secondary | ICD-10-CM | POA: Diagnosis not present

## 2022-03-30 NOTE — Progress Notes (Signed)
Acute Office Visit   Patient: [name]   DOB: March 03, 1965   57 y.o. Male  MRN: [id number] Visit Date: 03/30/2022  Today's healthcare provider: Dani Gobble Najma Bozarth, PA-C  Introduced myself to the patient as a Journalist, newspaper and provided education on APPs in clinical practice.    Chief Complaint  Patient presents with   Leg Swelling   Subjective    HPI    Leg Swelling  Reports his left leg was swelling a bit  States he has been leaving his compression socks on over night  He states he has been rolling his socks down to his ankles at times   Onset: sudden  Duration: started today   Location: lateral area of left calf States the compression stocking seemed to cut into his leg and damage the skin  He states he tried getting a smaller size compression stocking than recommended to help provide better compression and pressure but then it started to irritate his leg  Reports after taking stockings off his legs were hurting and throbbing but now feel better States now he is wrapping his leg with ace wraps around lower calf   Associated symptoms: denies SOB or coughing  SOB: denies  Pain level and character: reports he needs something for pain   States he has taken 2 Tramadol and now the pain is gone as of now  He states " I don't think anything wrong with my leg"  He states he has "vein mapping" at 9:30 tomorrow He states he thinks the swelling is caused by the stockings- attempted to discuss DVT education and concerns for urgent imaging Patient continued to interrupt provider during education to discuss compression stockings and apt with Vascular tomorrow I asked if he wished to wait until tomorrow to speak to vascular instead of having urgent US for DVT rule out and provided risks education as much as I was able to  He declined [place] today after education was provided and asked me to reapply wrapping to his leg     Medications: Outpatient Medications Prior to Visit   Medication Sig   albuterol (PROVENTIL) (2.5 MG/3ML) 0.083% nebulizer solution Take 3 mLs (2.5 mg total) by nebulization every 6 (six) hours as needed for wheezing or shortness of breath.   albuterol (VENTOLIN HFA) 108 (90 Base) MCG/ACT inhaler Inhale 2 puffs into the lungs every 4 (four) hours as needed for wheezing or shortness of breath.   allopurinol (ZYLOPRIM) 100 MG tablet Take 2 tablets (200 mg total) by mouth daily.   atorvastatin (LIPITOR) 20 MG tablet Take 1 tablet (20 mg total) by mouth daily.   blood glucose meter kit and supplies KIT Brand of choice; LON 99 months; check FSBS 3x a day; E11.65; disp one meter with 100 strips + 1 refill of strips   Blood Glucose Monitoring Suppl (ONE TOUCH ULTRA 2) w/Device KIT Use to check blood glucose up to 3 x daily   bumetanide (BUMEX) 1 MG tablet Take by mouth. Take 2 tablets (2 mg total) by mouth in the morning and 2 tablets (2 mg total) at noon and 2 tablets (2 mg total) in the evening.   calcitRIOL (ROCALTROL) 0.5 MCG capsule Take 2 capsules (1 mcg total) by mouth 1 (one) time each day   carvedilol (COREG) 25 MG tablet Take 2 tablets (50 mg total) by mouth 2 (two) times daily with a meal.   cyanocobalamin (VITAMIN B12) 1000 MCG tablet Take  1 tablet (1,000 mcg total) by mouth daily.   cyclobenzaprine (FLEXERIL) 10 MG tablet Take 1 tablet (10 mg total) by mouth 3 (three) times daily as needed for muscle spasms.   Dulaglutide (TRULICITY) A999333 0000000 SOPN INJECT 0.'75MG'$  (0.5ML) UNDER THE SKIN ONCE A WEEK   esomeprazole (NEXIUM) 40 MG capsule Take 1 capsule (40 mg total) by mouth 2 (two) times daily before a meal.   ferrous gluconate (FERGON) 324 MG tablet Take 1 tablet (324 mg total) by mouth daily with breakfast.   FLUoxetine (PROZAC) 10 MG capsule Take 10 mg by mouth daily.   FLUoxetine (PROZAC) 10 MG tablet Take 1 tablet (10 mg total) by mouth daily.   fluticasone (FLONASE) 50 MCG/ACT nasal spray Place 2 sprays into both nostrils daily. Use for  4-6 weeks then stop and use seasonally or as needed.   hydrocortisone 1 % ointment Apply 1 Application topically 2 (two) times daily.   metolazone (ZAROXOLYN) 5 MG tablet Take 1 tablet (5 mg total) by mouth 1 (one) time each day if needed (for weight gain)   OneTouch Delica Lancets 99991111 MISC Use to check blood sugar 3 x daily   ONETOUCH ULTRA test strip Check blood sugar 3 x daily   oxyCODONE (OXY IR/ROXICODONE) 5 MG immediate release tablet Take 1 tablet (5 mg total) by mouth every 6 (six) hours as needed for severe pain.   potassium chloride (KLOR-CON) 10 MEQ tablet Take 1 tablet (10 mEq total) by mouth daily.   simethicone (MYLICON) 0000000 MG chewable tablet Chew 125 mg by mouth every 6 (six) hours as needed for flatulence.   No facility-administered medications prior to visit.    Review of Systems  Respiratory:  Negative for cough, chest tightness, shortness of breath and wheezing.   Cardiovascular:  Positive for leg swelling (left leg swelling). Negative for chest pain and palpitations.       Objective    BP 136/82   Pulse 77   Temp 98 F (36.7 C) (Oral)   Resp 16   Ht 5' 11.75" (1.822 m)   Wt (!) 345 lb (156.5 kg)   SpO2 98%   BMI 47.12 kg/m    Physical Exam Vitals reviewed.  Constitutional:      General: He is awake.     Appearance: Normal appearance. He is well-developed and well-groomed.  HENT:     Head: Normocephalic and atraumatic.  Musculoskeletal:     Right lower leg: Swelling present. 2+ Pitting Edema present.     Left lower leg: Swelling and tenderness present. No lacerations. 3+ Pitting Edema present.       Legs:     Comments: Left leg is swollen and appears to be larger in circumference than right Lower left calf is firm to palpation compared to right and pitting edema is more severe  Anterior left lower leg is very dry and skin is flaky - there is healing wound to left lower leg - no apparent drainage, bleeding or weeping noted.   Neurological:     Mental  Status: He is alert.  Psychiatric:        Behavior: Behavior is cooperative.       No results found for any visits on 03/30/22.  Assessment & Plan      No follow-ups on file.      Problem List Items Addressed This Visit   None Visit Diagnoses     Localized swelling of left lower leg    -  Primary  Acute, new concern Reports he thinks his stockings were cutting into his leg and causing pain and increased swelling Despite multiple attempts at education regarding my concerns for cellulitis and DVT patient continued to insist that this was due to his compression stockings and tried to discount these concerns. He declined referral for stat US to rule out DVT- explained the risks of DVT leading to PE and other complications during apt but he still declined  Patient reports he has apt with Vascular services tomorrow - recommend he keep this apt for evaluation and potential management  Follow up as needed for persistent or progressing symptoms -Recommend ED evaluation if swelling becomes worse or there are further signs of DVT/ cellulitis.           No follow-ups on file.   I, Thorne Wirz E Timmy Cleverly, PA-C, have reviewed all documentation for this visit. The documentation on 03/30/22 for the exam, diagnosis, procedures, and orders are all accurate and complete.   Talitha Givens, MHS, PA-C [facility]

## 2022-03-30 NOTE — Telephone Encounter (Signed)
  Chief Complaint: Sore left leg Symptoms: Reports sore left leg from compression hose. States were binding. "Not open but red, hose too tight." Frequency: Off  and on after accident" Pertinent Negatives: Patient denies  Disposition: '[]'$ ED /'[]'$ Urgent Care (no appt availability in office) / '[x]'$ Appointment(In office/virtual)/ '[]'$  [facility]/ '[]'$ Home Care/ '[]'$ Refused Recommended Disposition /'[]'$ [facility]/ '[]'$  Follow-up with PCP Additional Notes: Appt secured for today. Care advise provided, verbalizes understanding. Reason for Disposition  [1] Red area or streak [2] large (> 2 in. or 5 cm)    "Sore from tight hose."  Answer Assessment - Initial Assessment Questions 1. ONSET: "When did the swelling start?" (e.g., minutes, hours, days)     After accident, mild 2. LOCATION: "What part of the leg is swollen?"  "Are both legs swollen or just one leg?"     Left leg 3. SEVERITY: "How bad is the swelling?" (e.g., localized; mild, moderate, severe)   - Localized: Small area of swelling localized to one leg.   - MILD pedal edema: Swelling limited to foot and ankle, pitting edema < 1/4 inch (6 mm) deep, rest and elevation eliminate most or all swelling.   - MODERATE edema: Swelling of lower leg to knee, pitting edema > 1/4 inch (6 mm) deep, rest and elevation only partially reduce swelling.   - SEVERE edema: Swelling extends above knee, facial or hand swelling present.      moderate 4. REDNESS: "Does the swelling look red or infected?"     Yes, "Where stocking cuts in." 5. PAIN: "Is the swelling painful to touch?" If Yes, ask: "How painful is it?"   (Scale 1-10; mild, moderate or severe)     Mild 6. FEVER: "Do you have a fever?" If Yes, ask: "What is it, how was it measured, and when did it start?"      No 7. CAUSE: "What do you think is causing the leg swelling?"     Hose too tight 8. MEDICAL HISTORY: "Do you have a history of blood clots (e.g., DVT), cancer, heart failure, kidney  disease, or liver failure?"      9. RECURRENT SYMPTOM: "Have you had leg swelling before?" If Yes, ask: "When was the last time?" "What happened that time?"     Yes, happens off and on 10. OTHER SYMPTOMS: "Do you have any other symptoms?" (e.g., chest pain, difficulty breathing)       "Compression hose too tight. Left a bruise around knee."  Protocols used: Leg Swelling and Edema-A-AH

## 2022-03-30 NOTE — Telephone Encounter (Signed)
   Telephone encounter was:  Unsuccessful.  03/30/2022 Name: SPIKE EKIS MRN: [id number] DOB: [birth year]  Unsuccessful outbound call made today to assist with:  Food Insecurity and housing.  Outreach Attempt:  1st Attempt  A HIPAA compliant voice message was left requesting a return call.  Instructed patient to call back at [phone].  [facility] Resource Care Guide   [email or web address]  ?? [id number]   Website: [email or web address]

## 2022-03-31 ENCOUNTER — Telehealth: Payer: Self-pay

## 2022-03-31 ENCOUNTER — Other Ambulatory Visit (INDEPENDENT_AMBULATORY_CARE_PROVIDER_SITE_OTHER): Payer: PPO

## 2022-03-31 ENCOUNTER — Other Ambulatory Visit: Payer: Self-pay | Admitting: Family Medicine

## 2022-03-31 ENCOUNTER — Encounter (INDEPENDENT_AMBULATORY_CARE_PROVIDER_SITE_OTHER): Payer: Self-pay | Admitting: Vascular Surgery

## 2022-03-31 ENCOUNTER — Ambulatory Visit (INDEPENDENT_AMBULATORY_CARE_PROVIDER_SITE_OTHER): Payer: PPO | Admitting: Vascular Surgery

## 2022-03-31 ENCOUNTER — Ambulatory Visit (INDEPENDENT_AMBULATORY_CARE_PROVIDER_SITE_OTHER): Payer: PPO

## 2022-03-31 VITALS — BP 136/95 | HR 89 | Resp 20 | Ht 71.75 in | Wt 326.0 lb

## 2022-03-31 DIAGNOSIS — N185 Chronic kidney disease, stage 5: Secondary | ICD-10-CM

## 2022-03-31 DIAGNOSIS — Z794 Long term (current) use of insulin: Secondary | ICD-10-CM

## 2022-03-31 DIAGNOSIS — G894 Chronic pain syndrome: Secondary | ICD-10-CM

## 2022-03-31 DIAGNOSIS — E785 Hyperlipidemia, unspecified: Secondary | ICD-10-CM

## 2022-03-31 DIAGNOSIS — M159 Polyosteoarthritis, unspecified: Secondary | ICD-10-CM

## 2022-03-31 DIAGNOSIS — N184 Chronic kidney disease, stage 4 (severe): Secondary | ICD-10-CM

## 2022-03-31 DIAGNOSIS — I10 Essential (primary) hypertension: Secondary | ICD-10-CM

## 2022-03-31 DIAGNOSIS — E1169 Type 2 diabetes mellitus with other specified complication: Secondary | ICD-10-CM | POA: Diagnosis not present

## 2022-03-31 DIAGNOSIS — E1122 Type 2 diabetes mellitus with diabetic chronic kidney disease: Secondary | ICD-10-CM | POA: Diagnosis not present

## 2022-03-31 NOTE — Progress Notes (Signed)
Patient ID: [name], male   DOB: 07-28-1965, 57 y.o.   MRN: [id number]  Chief Complaint  Patient presents with   New Patient (Initial Visit)    np. ue art dup + ue v-map + consult. mapping for HD access. referred by singh, harmeet.      HPI [name] is a 57 y.o. male.  I am asked to see the patient by Dr. Candiss Norse for evaluation of dialysis access for chronic kidney disease nearing dialysis dependence.  We actually put a temporary dialysis catheter to him about 5 or 6 months ago but he had return of renal function and did not require a PermCath for permanent access at that time.  He reports having longstanding chronic kidney disease and is now stage IV.  He struggles with volume overload and lethargy.  He is understanding that he will likely need to transition to dialysis over the next several months.  He seems to have some grasp of the significance of the situation but has a lot of basic questions today as well.  He is right-hand dominant.  He is not currently on dialysis.  We performed noninvasive studies which demonstrated adequate cephalic vein at the antecubital fossa on either side with small to marginal cephalic vein in the forearm on both sides.  His arterial flow is triphasic throughout both upper extremities.     Past Medical History:  Diagnosis Date   Allergy    Asthma    Diabetes mellitus without complication ([place])    Gout    Hyperlipidemia    Hypertension    Morbid obesity ([place])     Past Surgical History:  Procedure Laterality Date   TEMPORARY DIALYSIS CATHETER Right 11/14/2021   Procedure: TEMPORARY DIALYSIS CATHETER;  Surgeon: Algernon Huxley, MD;  Location: [facility];  Service: Cardiovascular;  Laterality: Right;     Family History  Problem Relation Age of Onset   Diabetes Mother    Hypertension Mother    Alcohol abuse Father    Glaucoma Father    Diabetes Father    Hypertension Father    Cancer Neg Hx    COPD Neg Hx    Heart  disease Neg Hx    Stroke Neg Hx       Social History   Tobacco Use   Smoking status: Never   Smokeless tobacco: Never  Vaping Use   Vaping Use: Never used  Substance Use Topics   Alcohol use: Yes    Comment: a glass of wine and a couple beers a day   Drug use: No     Allergies  Allergen Reactions   Talc     Current Outpatient Medications  Medication Sig Dispense Refill   albuterol (PROVENTIL) (2.5 MG/3ML) 0.083% nebulizer solution Take 3 mLs (2.5 mg total) by nebulization every 6 (six) hours as needed for wheezing or shortness of breath. 150 mL 3   albuterol (VENTOLIN HFA) 108 (90 Base) MCG/ACT inhaler Inhale 2 puffs into the lungs every 4 (four) hours as needed for wheezing or shortness of breath. 18 g 3   allopurinol (ZYLOPRIM) 100 MG tablet Take 2 tablets (200 mg total) by mouth daily. 180 tablet 0   atorvastatin (LIPITOR) 20 MG tablet Take 1 tablet (20 mg total) by mouth daily. 90 tablet 3   blood glucose meter kit and supplies KIT Brand of choice; LON 99 months; check FSBS 3x a day; E11.65; disp one meter with 100 strips +  1 refill of strips 1 each 0   Blood Glucose Monitoring Suppl (ONE TOUCH ULTRA 2) w/Device KIT Use to check blood glucose up to 3 x daily 1 kit 0   bumetanide (BUMEX) 1 MG tablet Take by mouth. Take 2 tablets (2 mg total) by mouth in the morning and 2 tablets (2 mg total) at noon and 2 tablets (2 mg total) in the evening.     calcitRIOL (ROCALTROL) 0.5 MCG capsule Take 2 capsules (1 mcg total) by mouth 1 (one) time each day     carvedilol (COREG) 25 MG tablet Take 2 tablets (50 mg total) by mouth 2 (two) times daily with a meal. 360 tablet 3   cyanocobalamin (VITAMIN B12) 1000 MCG tablet Take 1 tablet (1,000 mcg total) by mouth daily. 90 tablet 1   cyclobenzaprine (FLEXERIL) 10 MG tablet Take 1 tablet (10 mg total) by mouth 3 (three) times daily as needed for muscle spasms. 60 tablet 2   Dulaglutide (TRULICITY) A999333 0000000 SOPN INJECT 0.'75MG'$  (0.5ML)  UNDER THE SKIN ONCE A WEEK 6 mL 1   esomeprazole (NEXIUM) 40 MG capsule Take 1 capsule (40 mg total) by mouth 2 (two) times daily before a meal. 180 capsule 0   ferrous gluconate (FERGON) 324 MG tablet Take 1 tablet (324 mg total) by mouth daily with breakfast. 100 tablet 0   FLUoxetine (PROZAC) 10 MG capsule Take 10 mg by mouth daily.     FLUoxetine (PROZAC) 10 MG tablet Take 1 tablet (10 mg total) by mouth daily. 30 tablet 1   fluticasone (FLONASE) 50 MCG/ACT nasal spray Place 2 sprays into both nostrils daily. Use for 4-6 weeks then stop and use seasonally or as needed. 16 g 3   hydrocortisone 1 % ointment Apply 1 Application topically 2 (two) times daily. 30 g 0   metolazone (ZAROXOLYN) 5 MG tablet Take 1 tablet (5 mg total) by mouth 1 (one) time each day if needed (for weight gain)     OneTouch Delica Lancets 99991111 MISC Use to check blood sugar 3 x daily 300 each 3   ONETOUCH ULTRA test strip Check blood sugar 3 x daily 300 each 3   oxyCODONE (OXY IR/ROXICODONE) 5 MG immediate release tablet Take 1 tablet (5 mg total) by mouth every 6 (six) hours as needed for severe pain. 20 tablet 0   potassium chloride (KLOR-CON) 10 MEQ tablet Take 1 tablet (10 mEq total) by mouth daily. 30 tablet 2   simethicone (MYLICON) 0000000 MG chewable tablet Chew 125 mg by mouth every 6 (six) hours as needed for flatulence.     No current facility-administered medications for this visit.      REVIEW OF SYSTEMS (Negative unless checked)  Constitutional: '[]'$ Weight loss  '[]'$ Fever  '[]'$ Chills Cardiac: '[]'$ Chest pain   '[]'$ Chest pressure   '[]'$ Palpitations   '[x]'$ Shortness of breath when laying flat   '[]'$ Shortness of breath at rest   '[x]'$ Shortness of breath with exertion. Vascular:  '[]'$ Pain in legs with walking   '[]'$ Pain in legs at rest   '[]'$ Pain in legs when laying flat   '[]'$ Claudication   '[]'$ Pain in feet when walking  '[]'$ Pain in feet at rest  '[]'$ Pain in feet when laying flat   '[]'$ History of DVT   '[]'$ Phlebitis   '[x]'$ Swelling in legs    '[]'$ Varicose veins   '[]'$ Non-healing ulcers Pulmonary:   '[]'$ Uses home oxygen   '[]'$ Productive cough   '[]'$ Hemoptysis   '[]'$ Wheeze  '[]'$ COPD   '[]'$ Asthma Neurologic:  '[]'$ Dizziness  '[]'$ Blackouts   '[]'$   Seizures   '[]'$ History of stroke   '[]'$ History of TIA  '[]'$ Aphasia   '[]'$ Temporary blindness   '[]'$ Dysphagia   '[]'$ Weakness or numbness in arms   '[]'$ Weakness or numbness in legs Musculoskeletal:  '[x]'$ Arthritis   '[]'$ Joint swelling   '[]'$ Joint pain   '[]'$ Low back pain Hematologic:  '[]'$ Easy bruising  '[]'$ Easy bleeding   '[]'$ Hypercoagulable state   '[x]'$ Anemic  '[]'$ Hepatitis Gastrointestinal:  '[]'$ Blood in stool   '[]'$ Vomiting blood  '[]'$ Gastroesophageal reflux/heartburn   '[]'$ Abdominal pain Genitourinary:  '[x]'$ Chronic kidney disease   '[]'$ Difficult urination  '[]'$ Frequent urination  '[]'$ Burning with urination   '[]'$ Hematuria Skin:  '[]'$ Rashes   '[]'$ Ulcers   '[]'$ Wounds Psychological:  '[]'$ History of anxiety   '[]'$  History of major depression.    Physical Exam BP (!) 136/95 (BP Location: Left Arm)   Pulse 89   Resp 20   Ht 5' 11.75" (1.822 m)   Wt (!) 326 lb (147.9 kg)   BMI 44.52 kg/m  Gen:  WD/WN, NAD. Morbidly obese Head: [place]/AT, No temporalis wasting.  Ear/Nose/Throat: Hearing grossly intact, nares w/o erythema or drainage, oropharynx w/o Erythema/Exudate Eyes: Conjunctiva clear, sclera non-icteric  Neck: trachea midline.  No JVD.  Pulmonary:  Good air movement, respirations not labored, no use of accessory muscles  Cardiac: RRR, no JVD Vascular: Allens test normal Vessel Right Left  Radial Palpable Palpable                                   Gastrointestinal:. No masses, surgical incisions, or scars. Musculoskeletal: M/S 5/5 throughout.  Extremities without ischemic changes.  No deformity or atrophy. 2+ BLE edema. In a wheelchair Neurologic: Sensation grossly intact in extremities.  Symmetrical.  Speech is fluent. Motor exam as listed above. Psychiatric: Judgment intact, Mood & affect appropriate for pt's clinical situation. Dermatologic: No rashes or  ulcers noted.  No cellulitis or open wounds.    Radiology No results found.  Labs Recent Results (from the past 2160 hour(s))  Folate     Status: None   Collection Time: 02/27/22  9:31 AM  Result Value Ref Range   Folate 18.6 >5.9 ng/mL    Comment: Performed at [facility], [address] [place], [place] [address]  Vitamin B12     Status: Abnormal   Collection Time: 02/27/22  9:31 AM  Result Value Ref Range   Vitamin B-12 2,462 (H) 180 - 914 pg/mL    Comment: (NOTE) This assay is not validated for testing neonatal or myeloproliferative syndrome specimens for Vitamin B12 levels. Performed at [facility], [place] [address] [place], [place] [address]   CBC with Differential/Platelet     Status: Abnormal   Collection Time: 02/27/22  9:31 AM  Result Value Ref Range   WBC 5.3 4.0 - 10.5 K/uL   RBC 3.89 (L) 4.22 - 5.81 MIL/uL   Hemoglobin 12.1 (L) 13.0 - 17.0 g/dL   HCT 40.1 39.0 - 52.0 %   MCV 103.1 (H) 80.0 - 100.0 fL   MCH 31.1 26.0 - 34.0 pg   MCHC 30.2 30.0 - 36.0 g/dL   RDW 19.1 (H) 11.5 - 15.5 %   Platelets 157 150 - 400 K/uL   nRBC 0.0 0.0 - 0.2 %   Neutrophils Relative % 66 %   Neutro Abs 3.4 1.7 - 7.7 K/uL   Lymphocytes Relative 24 %   Lymphs Abs 1.3 0.7 - 4.0 K/uL   Monocytes Relative 7 %  Monocytes Absolute 0.4 0.1 - 1.0 K/uL   Eosinophils Relative 2 %   Eosinophils Absolute 0.1 0.0 - 0.5 K/uL   Basophils Relative 1 %   Basophils Absolute 0.1 0.0 - 0.1 K/uL   Immature Granulocytes 0 %   Abs Immature Granulocytes 0.02 0.00 - 0.07 K/uL    Comment: Performed at [facility], [address] [place], [place] [address]  Iron and TIBC     Status: Abnormal   Collection Time: 02/27/22  9:31 AM  Result Value Ref Range   Iron 35 (L) 45 - 182 ug/dL   TIBC 391 250 - 450 ug/dL   Saturation Ratios 9 (L) 17.9 - 39.5 %   UIBC 356 ug/dL    Comment: Performed at [facility], [address] [place], [place] [address]  Ferritin      Status: None   Collection Time: 02/27/22  9:31 AM  Result Value Ref Range   Ferritin 24 24 - 336 ng/mL    Comment: Performed at [facility], [address] [place], [place] [address]  Urine Microalbumin w/creat. ratio     Status: Abnormal   Collection Time: 03/16/22 11:02 AM  Result Value Ref Range   Creatinine, Urine 53 20 - 320 mg/dL   Microalb, Ur 33.8 mg/dL    Comment: Verified by repeat analysis. [name] Kitchen Reference Range Not established    Microalb Creat Ratio 638 (H) <30 mcg/mg creat    Comment: . The ADA defines abnormalities in albumin excretion as follows: [name] Kitchen Albuminuria Category        Result (mcg/mg creatinine) . Normal to Mildly increased   <30 Moderately increased         30-299  Severely increased           > OR = 300 . The ADA recommends that at least two of three specimens collected within a 3-6 month period be abnormal before considering a patient to be within a diagnostic category.   COMPLETE METABOLIC PANEL WITH GFR     Status: Abnormal   Collection Time: 03/16/22 11:02 AM  Result Value Ref Range   Glucose, Bld 122 (H) 65 - 99 mg/dL    Comment: .            Fasting reference interval . For someone without known diabetes, a glucose value between 100 and 125 mg/dL is consistent with prediabetes and should be confirmed with a follow-up test. .    BUN 55 (H) 7 - 25 mg/dL   Creat 2.96 (H) 0.70 - 1.30 mg/dL   eGFR 24 (L) > OR = 60 mL/min/1.30m   BUN/Creatinine Ratio 19 6 - 22 (calc)   Sodium 141 135 - 146 mmol/L   Potassium 3.8 3.5 - 5.3 mmol/L   Chloride 92 (L) 98 - 110 mmol/L   CO2 34 (H) 20 - 32 mmol/L   Calcium 9.3 8.6 - 10.3 mg/dL   Total Protein 6.8 6.1 - 8.1 g/dL   Albumin 3.4 (L) 3.6 - 5.1 g/dL   Globulin 3.4 1.9 - 3.7 g/dL (calc)   AG Ratio 1.0 1.0 - 2.5 (calc)   Total Bilirubin 0.7 0.2 - 1.2 mg/dL   Alkaline phosphatase (APISO) 109 35 - 144 U/L   AST 14 10 - 35 U/L   ALT 12 9 - 46 U/L  HgB A1c     Status: Abnormal   Collection  Time: 03/16/22 11:02 AM  Result Value Ref Range   Hgb A1c MFr Bld  6.2 (H) <5.7 % of total Hgb    Comment: For someone without known diabetes, a hemoglobin  A1c value between 5.7% and 6.4% is consistent with prediabetes and should be confirmed with a  follow-up test. . For someone with known diabetes, a value <7% indicates that their diabetes is well controlled. A1c targets should be individualized based on duration of diabetes, age, comorbid conditions, and other considerations. . This assay result is consistent with an increased risk of diabetes. . Currently, no consensus exists regarding use of hemoglobin A1c for diagnosis of diabetes for children. .    Mean Plasma Glucose 131 mg/dL   eAG (mmol/L) 7.3 mmol/L    Comment: . HbA1c performed on Roche platform. Effective 11/03/21 a change in test platforms may have  shifted HbA1c results compared to historical results.   Lipase, blood     Status: None   Collection Time: 03/24/22 12:07 PM  Result Value Ref Range   Lipase 36 11 - 51 U/L    Comment: Performed at [facility], [address] [place], [place] [address]  Comprehensive metabolic panel     Status: Abnormal   Collection Time: 03/24/22 12:07 PM  Result Value Ref Range   Sodium 135 135 - 145 mmol/L   Potassium 3.2 (L) 3.5 - 5.1 mmol/L   Chloride 90 (L) 98 - 111 mmol/L   CO2 36 (H) 22 - 32 mmol/L   Glucose, Bld 116 (H) 70 - 99 mg/dL    Comment: Glucose reference range applies only to samples taken after fasting for at least 8 hours.   BUN 52 (H) 6 - 20 mg/dL   Creatinine, Ser 2.78 (H) 0.61 - 1.24 mg/dL   Calcium 8.9 8.9 - 10.3 mg/dL   Total Protein 6.8 6.5 - 8.1 g/dL   Albumin 3.0 (L) 3.5 - 5.0 g/dL   AST 19 15 - 41 U/L   ALT 16 0 - 44 U/L   Alkaline Phosphatase 97 38 - 126 U/L   Total Bilirubin 1.5 (H) 0.3 - 1.2 mg/dL   GFR, Estimated 26 (L) >60 mL/min    Comment: (NOTE) Calculated using the CKD-EPI Creatinine Equation (2021)    Anion gap 9 5 - 15     Comment: Performed at [facility], [address] [place], [place] [address]  CBC     Status: Abnormal   Collection Time: 03/24/22 12:07 PM  Result Value Ref Range   WBC 5.7 4.0 - 10.5 K/uL   RBC 4.45 4.22 - 5.81 MIL/uL   Hemoglobin 13.1 13.0 - 17.0 g/dL   HCT 45.9 39.0 - 52.0 %   MCV 103.1 (H) 80.0 - 100.0 fL   MCH 29.4 26.0 - 34.0 pg   MCHC 28.5 (L) 30.0 - 36.0 g/dL   RDW 17.2 (H) 11.5 - 15.5 %   Platelets 125 (L) 150 - 400 K/uL   nRBC 0.0 0.0 - 0.2 %    Comment: Performed at [facility], [address] [place], [place] [address]  Urinalysis, Routine w reflex microscopic -Urine, Clean Catch     Status: Abnormal   Collection Time: 03/24/22 12:08 PM  Result Value Ref Range   Color, Urine YELLOW (A) YELLOW   APPearance CLEAR (A) CLEAR   Specific Gravity, Urine 1.012 1.005 - 1.030   pH 6.0 5.0 - 8.0   Glucose, UA NEGATIVE NEGATIVE mg/dL   Hgb urine dipstick NEGATIVE NEGATIVE   Bilirubin Urine NEGATIVE NEGATIVE   Ketones, ur NEGATIVE NEGATIVE  mg/dL   Protein, ur 100 (A) NEGATIVE mg/dL   Nitrite NEGATIVE NEGATIVE   Leukocytes,Ua NEGATIVE NEGATIVE   RBC / HPF 0-5 0 - 5 RBC/hpf   WBC, UA 0-5 0 - 5 WBC/hpf   Bacteria, UA NONE SEEN NONE SEEN   Squamous Epithelial / HPF 0-5 0 - 5 /HPF   Mucus PRESENT    Hyaline Casts, UA PRESENT     Comment: Performed at [facility], [address] [place],  [address]    Assessment/Plan:  CKD (chronic kidney disease), stage IV ([place]) We performed noninvasive studies which demonstrated adequate cephalic vein at the antecubital fossa on either side with small to marginal cephalic vein in the forearm on both sides.  His arterial flow is triphasic throughout both upper extremities.   Given this finding, I would recommend proceeding with left brachiocephalic AV fistula creation.  Hopefully, he will have several months to allow this to mature and avoid catheter placement.  I discussed the multiple means of  hemodialysis including catheter-based dialysis, AV graft, and AV fistulas.  I done my best to describe why he needs an access for dialysis and can just use a regular IV.  I have discussed the timing of the procedure and needing 6 to 8 weeks of maturation before fistula can be used, which would generally remain putting this in early would be preferable.  He seems to voices understanding and is agreeable to proceed with left arm AV fistula creation.  Type 2 diabetes mellitus, controlled, with renal complications (HCC) An underlying cause of his renal failure and blood glucose control important in reducing the progression of atherosclerotic disease. Also, involved in wound healing. On appropriate medications.   Hyperlipidemia associated with type 2 diabetes mellitus (HCC) lipid control important in reducing the progression of atherosclerotic disease. Continue statin therapy      Leotis Pain 03/31/2022, 12:04 PM   This note was created with Dragon medical transcription system.  Any errors from dictation are unintentional.

## 2022-03-31 NOTE — Patient Instructions (Signed)
AV Fistula Placement  Arteriovenous (AV) fistula placement is a surgical procedure to create a connection between a blood vessel that carries blood away from the heart (artery) and a blood vessel that returns blood to the heart (vein). This connection is called a fistula. It is often made in the forearm or upper arm. You may need this procedure if you are getting hemodialysis treatments for kidney disease. An AV fistula makes your vein larger and stronger over several months. This makes the vein a safe and easy spot to insert the needles that are used for hemodialysis. Tell a health care provider about: Any allergies you have. All medicines you are taking, including vitamins, herbs, eye drops, creams, and over-the-counter medicines. Any problems you or family members have had with anesthetic medicines. Any blood disorders you have. Any surgeries you have had. Any medical conditions you have or have had in the past. Whether you are pregnant or may be pregnant. What are the risks? Generally, this is a safe procedure. However, problems may occur, including: Infection. Blood clot. Reduced blood flow (stenosis). Weakening or ballooning out of the fistula (aneurysm). Bleeding. Allergic reactions to medicines. Nerve damage. Swelling near the fistula. Failure of the procedure. What happens before the procedure? Staying hydrated Follow instructions from your health care provider about hydration, which may include: Up to 2 hours before the procedure - you may continue to drink clear liquids, such as water, clear fruit juice, black coffee, and plain tea.  Eating and drinking restrictions Follow instructions from your health care provider about eating and drinking, which may include: 8 hours before the procedure - stop eating heavy meals or foods, such as meat, fried foods, or fatty foods. 6 hours before the procedure - stop eating light meals or foods, such as toast or cereal. 6 hours before the  procedure - stop drinking milk or drinks that contain milk. 2 hours before the procedure - stop drinking clear liquids. Medicines Ask your health care provider about: Changing or stopping your regular medicines. This is especially important if you are taking diabetes medicines or blood thinners. Taking medicines such as aspirin and ibuprofen. These medicines can thin your blood. Do not take these medicines unless your health care provider tells you to take them. Taking over-the-counter medicines, vitamins, herbs, and supplements. General instructions Do not use any products that contain nicotine or tobacco for at least 4 weeks before the procedure. These products include cigarettes, chewing tobacco, and vaping devices, such as e-cigarettes. If you need help quitting, ask your health care provider. Imaging tests of your arm may be done to find the best place for the fistula. Plan to have a responsible adult take you home from the hospital or clinic. Ask your health care provider: How your surgery site will be marked. What steps will be taken to help prevent infection. These steps may include: Removing hair at the surgery site. Washing skin with a germ-killing soap. Taking antibiotic medicine. What happens during the procedure? An IV will be inserted into one of your veins. You will be given one or more of the following: A medicine to help you relax (sedative). A medicine to numb the area (local anesthetic). A medicine to make you fall asleep (general anesthetic). A medicine that is injected into an area of your body to numb everything below the injection site (regional anesthetic). An incision will be made on the inner side of your arm. A vein and an artery will be opened and connected with stitches (  sutures). The incision will be closed with sutures or clips. A bandage (dressing) will be placed over the area. The procedure may vary among health care providers and hospitals. What happens  after the procedure? Your blood pressure, heart rate, breathing rate, and blood oxygen level may be monitored until you leave the hospital or clinic. Your fistula site will be checked for bleeding or swelling. You will be given pain medicine as needed. If you were given a sedative during the procedure, it can affect you for several hours. Do not drive or operate machinery until your health care provider says that it is safe. Summary Arteriovenous (AV) fistula placement is a surgical procedure to create a connection between a blood vessel that carries blood away from your heart (artery) and a blood vessel that returns blood to your heart (vein). This connection is called a fistula. Follow instructions from your health care provider about eating and drinking before the procedure. Ask your health care provider about changing or stopping your regular medicines before the procedure. This is especially important if you are taking diabetes medicines or blood thinners. Plan to have a responsible adult take you home from the hospital or clinic. This information is not intended to replace advice given to you by your health care provider. Make sure you discuss any questions you have with your health care provider. Document Revised: 08/23/2019 Document Reviewed: 08/23/2019 Elsevier Patient Education  [place].

## 2022-03-31 NOTE — Assessment & Plan Note (Signed)
We performed noninvasive studies which demonstrated adequate cephalic vein at the antecubital fossa on either side with small to marginal cephalic vein in the forearm on both sides.  His arterial flow is triphasic throughout both upper extremities.   Given this finding, I would recommend proceeding with left brachiocephalic AV fistula creation.  Hopefully, he will have several months to allow this to mature and avoid catheter placement.  I discussed the multiple means of hemodialysis including catheter-based dialysis, AV graft, and AV fistulas.  I done my best to describe why he needs an access for dialysis and can just use a regular IV.  I have discussed the timing of the procedure and needing 6 to 8 weeks of maturation before fistula can be used, which would generally remain putting this in early would be preferable.  He seems to voices understanding and is agreeable to proceed with left arm AV fistula creation.

## 2022-03-31 NOTE — Assessment & Plan Note (Signed)
lipid control important in reducing the progression of atherosclerotic disease. Continue statin therapy  

## 2022-03-31 NOTE — Telephone Encounter (Signed)
   Telephone encounter was:  Successful.  03/31/2022 Name: PAO GHAN MRN: [id number] DOB: [birth year]  [name] is a 57 y.o. year old male who is a primary care patient of Teodora Medici, DO . The community resource team was consulted for assistance with Food Insecurity and housing.  Care guide performed the following interventions: Spoke with patient verified email address to send information for [facility] low income housing, [employer] application, Pension scheme manager, [place] for eviction assistance. Patient gave consent to send [place] referrals to [place], Biochemist, clinical. Patient was at [employer].  Follow Up Plan:  No further follow up planned at this time. The patient has been provided with needed resources.  [place] Resource Care Guide   [email or web address]  ?? [id number]   Website: [email or web address]  [email or web address]

## 2022-03-31 NOTE — Assessment & Plan Note (Signed)
An underlying cause of his renal failure and blood glucose control important in reducing the progression of atherosclerotic disease. Also, involved in wound healing. On appropriate medications.

## 2022-04-01 ENCOUNTER — Other Ambulatory Visit: Payer: Self-pay | Admitting: Internal Medicine

## 2022-04-01 ENCOUNTER — Telehealth: Payer: Self-pay

## 2022-04-01 DIAGNOSIS — I10 Essential (primary) hypertension: Secondary | ICD-10-CM

## 2022-04-01 NOTE — Telephone Encounter (Signed)
Medication Refill - Medication: carvedilol (COREG) 25 MG tablet   Has the patient contacted their pharmacy? yes (Agent: If no, request that the patient contact the pharmacy for the refill. If patient does not wish to contact the pharmacy document the reason why and proceed with request.) (Agent: If yes, when and what did the pharmacy advise?)contact pcp  Preferred Pharmacy (with phone number or street name):  [facility] [place], [place] Phone: [phone]  Fax: [phone]     Has the patient been seen for an appointment in the last year OR does the patient have an upcoming appointment? yes  Agent: Please be advised that RX refills may take up to 3 business days. We ask that you follow-up with your pharmacy.

## 2022-04-01 NOTE — Telephone Encounter (Signed)
        Patient  visited [facility] on 2/27   Telephone encounter attempt :  1st  A HIPAA compliant voice message was left requesting a return call.  Instructed patient to call back .    [facility] [phone] [address], [place], [place] [address] Phone: [phone] Email: [email or web address]

## 2022-04-02 ENCOUNTER — Telehealth: Payer: Self-pay

## 2022-04-02 ENCOUNTER — Telehealth (INDEPENDENT_AMBULATORY_CARE_PROVIDER_SITE_OTHER): Payer: Self-pay

## 2022-04-02 NOTE — Telephone Encounter (Signed)
        Patient  visited [facility] on 2/27     Telephone encounter attempt :  2nd  A HIPAA compliant voice message was left requesting a return call.  Instructed patient to call back   [facility] [phone] [address], [place], [place] [address] Phone: [phone] Email: [email or web address]

## 2022-04-02 NOTE — Telephone Encounter (Signed)
Requested medication (s) are due for refill today: yes  Requested medication (s) are on the active medication list: yes  Last refill:  04/09/21  Future visit scheduled: yes  Notes to clinic:  Unable to refill per protocol, last refill by another provider. Routing for approval     Requested Prescriptions  Pending Prescriptions Disp Refills   carvedilol (COREG) 25 MG tablet 360 tablet 3    Sig: Take 2 tablets (50 mg total) by mouth 2 (two) times daily with a meal.     Cardiovascular: Beta Blockers 3 Failed - 04/01/2022  4:03 PM      Failed - Cr in normal range and within 360 days    Creat  Date Value Ref Range Status  03/16/2022 2.96 (H) 0.70 - 1.30 mg/dL Final   Creatinine, Ser  Date Value Ref Range Status  03/24/2022 2.78 (H) 0.61 - 1.24 mg/dL Final   Creatinine, POC  Date Value Ref Range Status  05/07/2015 0 mg/dL Final   Creatinine, Urine  Date Value Ref Range Status  03/16/2022 53 20 - 320 mg/dL Final         Failed - Last BP in normal range    BP Readings from Last 1 Encounters:  03/31/22 (!) 136/95         Passed - AST in normal range and within 360 days    AST  Date Value Ref Range Status  03/24/2022 19 15 - 41 U/L Final         Passed - ALT in normal range and within 360 days    ALT  Date Value Ref Range Status  03/24/2022 16 0 - 44 U/L Final         Passed - Last Heart Rate in normal range    Pulse Readings from Last 1 Encounters:  03/31/22 89         Passed - Valid encounter within last 6 months    Recent Outpatient Visits           3 days ago Localized swelling of left lower leg   [facility] Mecum, Dani Gobble, PA-C   1 week ago Diarrhea, unspecified type   [facility] Mecum, Dani Gobble, PA-C   1 week ago No-show for appointment   [facility] Mecum, Dani Gobble, PA-C   2 weeks ago CKD (chronic kidney disease), stage IV [facility])   [facility] Teodora Medici, DO   2 months ago Chronic pain syndrome   [place], [name], DO       Future Appointments             In 1 week Amalia Hailey, Dorathy Daft, [place] [facility] [place], [place]   In 3 weeks Teodora Medici, [facility], [place]   In 2 months Teodora Medici, [facility], [place] [facility]

## 2022-04-02 NOTE — Telephone Encounter (Signed)
I attempted to contact the patient to schedule him for a left brachiocephalic AVF with Dr. Lucky Cowboy. A message was left for a return call.

## 2022-04-03 ENCOUNTER — Other Ambulatory Visit: Payer: Self-pay | Admitting: Internal Medicine

## 2022-04-03 DIAGNOSIS — M159 Polyosteoarthritis, unspecified: Secondary | ICD-10-CM

## 2022-04-03 DIAGNOSIS — I10 Essential (primary) hypertension: Secondary | ICD-10-CM

## 2022-04-03 DIAGNOSIS — G894 Chronic pain syndrome: Secondary | ICD-10-CM

## 2022-04-03 MED ORDER — CARVEDILOL 25 MG PO TABS: 50.0000 mg | ORAL_TABLET | Freq: Two times a day (BID) | ORAL | 1 refills | Status: DC

## 2022-04-03 NOTE — Telephone Encounter (Signed)
Pt is requesting a refill on carvedilol. Stated it was suppose to have been sent in on yesterday.

## 2022-04-06 ENCOUNTER — Ambulatory Visit (INDEPENDENT_AMBULATORY_CARE_PROVIDER_SITE_OTHER): Payer: PPO | Admitting: Nurse Practitioner

## 2022-04-06 ENCOUNTER — Encounter (INDEPENDENT_AMBULATORY_CARE_PROVIDER_SITE_OTHER): Payer: Self-pay | Admitting: Nurse Practitioner

## 2022-04-06 ENCOUNTER — Ambulatory Visit: Payer: Self-pay | Admitting: *Deleted

## 2022-04-06 VITALS — BP 137/92 | HR 89 | Resp 17

## 2022-04-06 DIAGNOSIS — N184 Chronic kidney disease, stage 4 (severe): Secondary | ICD-10-CM

## 2022-04-06 DIAGNOSIS — E785 Hyperlipidemia, unspecified: Secondary | ICD-10-CM | POA: Diagnosis not present

## 2022-04-06 DIAGNOSIS — E1169 Type 2 diabetes mellitus with other specified complication: Secondary | ICD-10-CM

## 2022-04-06 DIAGNOSIS — Z794 Long term (current) use of insulin: Secondary | ICD-10-CM

## 2022-04-06 DIAGNOSIS — E1122 Type 2 diabetes mellitus with diabetic chronic kidney disease: Secondary | ICD-10-CM | POA: Diagnosis not present

## 2022-04-06 NOTE — Progress Notes (Deleted)
   Acute Office Visit  Subjective:     Patient ID: [name], male    DOB: July 25, 1965, 57 y.o.   MRN: [id number]  No chief complaint on file.   HPI Patient is in today for cough/congestion.  URI Compliant:   -Worst symptom: -Fever: {Blank single:19197::"yes","no"} -Cough: {Blank single:19197::"yes","no"} -Shortness of breath: {Blank single:19197::"yes","no"} -Wheezing: {Blank single:19197::"yes","no"} -Chest pain: {Blank single:19197::"yes","no","yes, with cough"} -Chest tightness: {Blank single:19197::"yes","no"} -Chest congestion: {Blank single:19197::"yes","no"} -Nasal congestion: {Blank single:19197::"yes","no"} -Runny nose: {Blank single:19197::"yes","no"} -Post nasal drip: {Blank single:19197::"yes","no"} -Sneezing: {Blank single:19197::"yes","no"} -Sore throat: {Blank single:19197::"yes","no"} -Swollen glands: {Blank single:19197::"yes","no"} -Sinus pressure: {Blank single:19197::"yes","no"} -Headache: {Blank single:19197::"yes","no"} -Face pain: {Blank single:19197::"yes","no"} -Toothache: {Blank single:19197::"yes","no"} -Ear pain: {Blank single:19197::"yes","no"} {Blank single:19197::""right","left", "bilateral"} -Ear pressure: {Blank single:19197::"yes","no"} {Blank single:19197::""right","left", "bilateral"} -Eyes red/itching:{Blank single:19197::"yes","no"} -Eye drainage/crusting: {Blank single:19197::"yes","no"}  -Vomiting: {Blank single:19197::"yes","no"} -Rash: {Blank single:19197::"yes","no"} -Fatigue: {Blank single:19197::"yes","no"} -Sick contacts: {Blank single:19197::"yes","no"} -Strep contacts: {Blank single:19197::"yes","no"}  -Context: {Blank multiple:19196::"better","worse","stable","fluctuating"} -Recurrent sinusitis: {Blank single:19197::"yes","no"} -Relief with OTC cold/cough medications: {Blank single:19197::"yes","no"}  -Treatments attempted: {Blank multiple:19196::"none","cold/sinus","mucinex","anti-histamine","pseudoephedrine","cough  syrup","antibiotics"}    ROS      Objective:    There were no vitals taken for this visit. {Vitals History (Optional):23777}  Physical Exam  No results found for any visits on 04/07/22.      Assessment & Plan:   Problem List Items Addressed This Visit   None   No orders of the defined types were placed in this encounter.   No follow-ups on file.  Teodora Medici, DO

## 2022-04-06 NOTE — Progress Notes (Incomplete)
Subjective:    Patient ID: [name], male    DOB: Mar 07, 1965, 57 y.o.   MRN: [id number] Chief Complaint  Patient presents with  . Follow-up    Discuss questions about dialysis    [name] presents today with additional questions regarding dialysis access placement.  Patient was previously seen and based on his noninvasive studies he has adequate access for right brachiocephalic AV fistula.  We discussed the difference between a temp cath and a PermCath.  We also discussed different between a fistula and AV graft.  The patient was very somnolent today and notes that he has not been sleeping well.  He is notably dozing off and slurring his speech, so it is difficult to ascertain if    Review of Systems  Psychiatric/Behavioral:  Positive for sleep disturbance.   All other systems reviewed and are negative.      Objective:   Physical Exam Vitals reviewed.  Constitutional:      Appearance: He is obese.     Comments: Seems to be dozing off during conversation  HENT:     Head: Normocephalic.  Cardiovascular:     Rate and Rhythm: Normal rate.  Pulmonary:     Effort: Pulmonary effort is normal.  Skin:    General: Skin is warm and dry.  Neurological:     Mental Status: He is oriented to person, place, and time.  Psychiatric:        Attention and Perception: He is inattentive.        Mood and Affect: Mood normal.        Speech: Speech is slurred.        Behavior: Behavior normal.        Thought Content: Thought content normal.        Judgment: Judgment normal.     BP (!) 137/92 (BP Location: Right Arm)   Pulse 89   Resp 17   Past Medical History:  Diagnosis Date  . Allergy   . Asthma   . Diabetes mellitus without complication ([place])   . Gout   . Hyperlipidemia   . Hypertension   . Morbid obesity ([place])     Social History   Socioeconomic History  . Marital status: Divorced    Spouse name: Not on file  . Number of children: Not on file  . Years of  education: Not on file  . Highest education level: Not on file  Occupational History  . Not on file  Tobacco Use  . Smoking status: Never  . Smokeless tobacco: Never  Vaping Use  . Vaping Use: Never used  Substance and Sexual Activity  . Alcohol use: Yes    Comment: a glass of wine and a couple beers a day  . Drug use: No  . Sexual activity: Not Currently  Other Topics Concern  . Not on file  Social History Narrative  . Not on file   Social Determinants of Health   Financial Resource Strain: Not on file  Food Insecurity: Food Insecurity Present (03/31/2022)   Hunger Vital Sign   . Worried About Charity fundraiser in the Last Year: Often true   . Ran Out of Food in the Last Year: Often true  Transportation Needs: No Transportation Needs (03/27/2022)   PRAPARE - Transportation   . Lack of Transportation (Medical): No   . Lack of Transportation (Non-Medical): No  Physical Activity: Not on file  Stress: Not on file  Social Connections: Not  on file  Intimate Partner Violence: Not At Risk (11/12/2021)   Humiliation, Afraid, Rape, and Kick questionnaire   . Fear of Current or Ex-Partner: No   . Emotionally Abused: No   . Physically Abused: No   . Sexually Abused: No    Past Surgical History:  Procedure Laterality Date  . TEMPORARY DIALYSIS CATHETER Right 11/14/2021   Procedure: TEMPORARY DIALYSIS CATHETER;  Surgeon: Algernon Huxley, MD;  Location: [facility];  Service: Cardiovascular;  Laterality: Right;    Family History  Problem Relation Age of Onset  . Diabetes Mother   . Hypertension Mother   . Alcohol abuse Father   . Glaucoma Father   . Diabetes Father   . Hypertension Father   . Cancer Neg Hx   . COPD Neg Hx   . Heart disease Neg Hx   . Stroke Neg Hx     Allergies  Allergen Reactions  . Talc        Latest Ref Rng & Units 03/24/2022   12:07 PM 02/27/2022    9:31 AM 12/15/2021   11:36 AM  CBC  WBC 4.0 - 10.5 K/uL 5.7  5.3  3.4   Hemoglobin 13.0  - 17.0 g/dL 13.1  12.1  9.8   Hematocrit 39.0 - 52.0 % 45.9  40.1  32.3   Platelets 150 - 400 K/uL 125  157  170       CMP     Component Value Date/Time   NA 135 03/24/2022 1207   NA 142 08/07/2014 0848   K 3.2 (L) 03/24/2022 1207   CL 90 (L) 03/24/2022 1207   CO2 36 (H) 03/24/2022 1207   GLUCOSE 116 (H) 03/24/2022 1207   BUN 52 (H) 03/24/2022 1207   BUN 23 08/07/2014 0848   CREATININE 2.78 (H) 03/24/2022 1207   CREATININE 2.96 (H) 03/16/2022 1102   CALCIUM 8.9 03/24/2022 1207   PROT 6.8 03/24/2022 1207   PROT 6.3 08/07/2014 0848   ALBUMIN 3.0 (L) 03/24/2022 1207   ALBUMIN 3.3 (L) 08/07/2014 0848   AST 19 03/24/2022 1207   ALT 16 03/24/2022 1207   ALKPHOS 97 03/24/2022 1207   BILITOT 1.5 (H) 03/24/2022 1207   BILITOT 0.5 08/07/2014 0848   GFRNONAA 26 (L) 03/24/2022 1207   GFRNONAA 42 (L) 10/05/2018 0755   GFRAA 48 (L) 10/05/2018 0755     No results found.     Assessment & Plan:   1. CKD (chronic kidney disease), stage IV (HCC) ***  2. Hyperlipidemia associated with type 2 diabetes mellitus ([place]) Continue statin as ordered and reviewed, no changes at this time  3. Controlled type 2 diabetes mellitus with stage 4 chronic kidney disease, with long-term current use of insulin (HCC) Continue hypoglycemic medications as already ordered, these medications have been reviewed and there are no changes at this time.  Hgb A1C to be monitored as already arranged by primary service   Current Outpatient Medications on File Prior to Visit  Medication Sig Dispense Refill  . albuterol (PROVENTIL) (2.5 MG/3ML) 0.083% nebulizer solution Take 3 mLs (2.5 mg total) by nebulization every 6 (six) hours as needed for wheezing or shortness of breath. 150 mL 3  . albuterol (VENTOLIN HFA) 108 (90 Base) MCG/ACT inhaler Inhale 2 puffs into the lungs every 4 (four) hours as needed for wheezing or shortness of breath. 18 g 3  . allopurinol (ZYLOPRIM) 100 MG tablet Take 2 tablets (200 mg  total) by mouth daily. 180 tablet  0  . atorvastatin (LIPITOR) 20 MG tablet Take 1 tablet (20 mg total) by mouth daily. 90 tablet 3  . blood glucose meter kit and supplies KIT Brand of choice; LON 99 months; check FSBS 3x a day; E11.65; disp one meter with 100 strips + 1 refill of strips 1 each 0  . Blood Glucose Monitoring Suppl (ONE TOUCH ULTRA 2) w/Device KIT Use to check blood glucose up to 3 x daily 1 kit 0  . bumetanide (BUMEX) 1 MG tablet Take by mouth. Take 2 tablets (2 mg total) by mouth in the morning and 2 tablets (2 mg total) at noon and 2 tablets (2 mg total) in the evening.    . calcitRIOL (ROCALTROL) 0.5 MCG capsule Take 2 capsules (1 mcg total) by mouth 1 (one) time each day    . carvedilol (COREG) 25 MG tablet Take 2 tablets (50 mg total) by mouth 2 (two) times daily with a meal. 360 tablet 1  . cyanocobalamin (VITAMIN B12) 1000 MCG tablet Take 1 tablet (1,000 mcg total) by mouth daily. 90 tablet 1  . cyclobenzaprine (FLEXERIL) 10 MG tablet Take 1 tablet (10 mg total) by mouth 3 (three) times daily as needed for muscle spasms. 60 tablet 2  . Dulaglutide (TRULICITY) A999333 0000000 SOPN INJECT 0.'75MG'$  (0.5ML) UNDER THE SKIN ONCE A WEEK 6 mL 1  . esomeprazole (NEXIUM) 40 MG capsule Take 1 capsule (40 mg total) by mouth 2 (two) times daily before a meal. 180 capsule 0  . ferrous gluconate (FERGON) 324 MG tablet Take 1 tablet (324 mg total) by mouth daily with breakfast. 100 tablet 0  . FLUoxetine (PROZAC) 10 MG capsule Take 10 mg by mouth daily.    [name] Kitchen FLUoxetine (PROZAC) 10 MG tablet Take 1 tablet (10 mg total) by mouth daily. 30 tablet 1  . fluticasone (FLONASE) 50 MCG/ACT nasal spray Place 2 sprays into both nostrils daily. Use for 4-6 weeks then stop and use seasonally or as needed. 16 g 3  . hydrocortisone 1 % ointment Apply 1 Application topically 2 (two) times daily. 30 g 0  . metolazone (ZAROXOLYN) 5 MG tablet Take 1 tablet (5 mg total) by mouth 1 (one) time each day if needed (for  weight gain)    . OneTouch Delica Lancets 99991111 MISC Use to check blood sugar 3 x daily 300 each 3  . ONETOUCH ULTRA test strip Check blood sugar 3 x daily 300 each 3  . oxyCODONE (OXY IR/ROXICODONE) 5 MG immediate release tablet Take 1 tablet (5 mg total) by mouth every 6 (six) hours as needed for severe pain. 20 tablet 0  . potassium chloride (KLOR-CON) 10 MEQ tablet Take 1 tablet (10 mEq total) by mouth daily. 30 tablet 2  . simethicone (MYLICON) 0000000 MG chewable tablet Chew 125 mg by mouth every 6 (six) hours as needed for flatulence.     No current facility-administered medications on file prior to visit.    There are no Patient Instructions on file for this visit. No follow-ups on file.   Kris Hartmann, NP

## 2022-04-06 NOTE — Telephone Encounter (Signed)
  Chief Complaint: chest congestion- not able to break up congestion Symptoms: chest congestion- heavy mucus, interferes with sleep Frequency: 2 weeks Pertinent Negatives: Patient denies fever, cough  Disposition: [] ED /[] Urgent Care (no appt availability in office) / [x] Appointment(In office/virtual)/ []  [place] [facility]/ [] Home Care/ [] Refused Recommended Disposition /[] [place] [facility]/ []  Follow-up with PCP Additional Notes: Offered appointment today- patient request to come to office tomorrow. Appointment has been scheduled and patient advised call back if he gets worse.  Reason for Disposition . [1] MILD difficulty breathing (e.g., minimal/no SOB at rest, SOB with walking, pulse <100) AND [2] still present when not coughing  Answer Assessment - Initial Assessment Questions 1. LOCATION: "Where does it hurt?"      No pain 2. ONSET: "When did the sinus pain start?"  (e.g., hours, days)      2 weeks- sinus drainage chest congestion 3. SEVERITY: "How bad is the pain?"   (Scale 1-10; mild, moderate or severe)   - MILD (1-3): doesn't interfere with normal activities    - MODERATE (4-7): interferes with normal activities (e.g., work or school) or awakens from sleep   - SEVERE (8-10): excruciating pain and patient unable to do any normal activities        No pain 4. RECURRENT SYMPTOM: "Have you ever had sinus problems before?" If Yes, ask: "When was the last time?" and "What happened that time?"      Congestion in chest 5. NASAL CONGESTION: "Is the nose blocked?" If Yes, ask: "Can you open it or must you breathe through your mouth?"     Nasal passage not blocked 6. NASAL DISCHARGE: "Do you have discharge from your nose?" If so ask, "What color?"     na 7. FEVER: "Do you have a fever?" If Yes, ask: "What is it, how was it measured, and when did it start?"      no 8. OTHER SYMPTOMS: "Do you have any other symptoms?" (e.g., sore throat, cough, earache, difficulty breathing)      Heavy sinus drainage- using Mucinex not helping, hard to sleep, chest congestion  Answer Assessment - Initial Assessment Questions 1. ONSET: "When did the cough begin?"      No cough- unable to move chest congestion 2. SEVERITY: "How bad is the cough today?"      No cough  5. DIFFICULTY BREATHING: "Are you having difficulty breathing?" If Yes, ask: "How bad is it?" (e.g., mild, moderate, severe)    - MILD: No SOB at rest, mild SOB with walking, speaks normally in sentences, can lie down, no retractions, pulse < 100.    - MODERATE: SOB at rest, SOB with minimal exertion and prefers to sit, cannot lie down flat, speaks in phrases, mild retractions, audible wheezing, pulse 100-120.    - SEVERE: Very SOB at rest, speaks in single words, struggling to breathe, sitting hunched forward, retractions, pulse > 120      Mild- interfere with sleep 6. FEVER: "Do you have a fever?" If Yes, ask: "What is your temperature, how was it measured, and when did it start?"     no 7. CARDIAC HISTORY: "Do you have any history of heart disease?" (e.g., heart attack, congestive heart failure)      Yes- CHF 8. LUNG HISTORY: "Do you have any history of lung disease?"  (e.g., pulmonary embolus, asthma, emphysema)     asthma  Protocols used: Sinus Pain or Congestion-A-AH, Cough - Acute Non-Productive-A-AH

## 2022-04-06 NOTE — Progress Notes (Signed)
Subjective:    Patient ID: [name], male    DOB: Oct 29, 1965, 57 y.o.   MRN: [id number] Chief Complaint  Patient presents with   Follow-up    Discuss questions about dialysis    [name] presents today with additional questions regarding dialysis access placement.  Patient was previously seen and based on his noninvasive studies he has adequate access for right brachiocephalic AV fistula.  We discussed the difference between a temp cath and a PermCath.  We also discussed different between a fistula and AV graft.  The patient was very somnolent today and notes that he has not been sleeping well.  He is notably dozing off and slurring his speech at times.    Review of Systems  Psychiatric/Behavioral:  Positive for sleep disturbance.   All other systems reviewed and are negative.      Objective:   Physical Exam Vitals reviewed.  Constitutional:      Appearance: He is obese.     Comments: Seems to be dozing off during conversation  HENT:     Head: Normocephalic.  Cardiovascular:     Rate and Rhythm: Normal rate.  Pulmonary:     Effort: Pulmonary effort is normal.  Skin:    General: Skin is warm and dry.  Neurological:     Mental Status: He is oriented to person, place, and time.  Psychiatric:        Attention and Perception: He is inattentive.        Mood and Affect: Mood normal.        Speech: Speech is slurred.        Behavior: Behavior normal.        Thought Content: Thought content normal.        Judgment: Judgment normal.     BP (!) 137/92 (BP Location: Right Arm)   Pulse 89   Resp 17   Past Medical History:  Diagnosis Date   Allergy    Asthma    Diabetes mellitus without complication (HCC)    Gout    Hyperlipidemia    Hypertension    Morbid obesity ([place])     Social History   Socioeconomic History   Marital status: Divorced    Spouse name: Not on file   Number of children: Not on file   Years of education: Not on file   Highest  education level: Not on file  Occupational History   Not on file  Tobacco Use   Smoking status: Never   Smokeless tobacco: Never  Vaping Use   Vaping Use: Never used  Substance and Sexual Activity   Alcohol use: Yes    Comment: a glass of wine and a couple beers a day   Drug use: No   Sexual activity: Not Currently  Other Topics Concern   Not on file  Social History Narrative   Not on file   Social Determinants of Health   Financial Resource Strain: Not on file  Food Insecurity: Food Insecurity Present (03/31/2022)   Hunger Vital Sign    Worried About Running Out of Food in the Last Year: Often true    Ran Out of Food in the Last Year: Often true  Transportation Needs: No Transportation Needs (03/27/2022)   PRAPARE - Hydrologist (Medical): No    Lack of Transportation (Non-Medical): No  Physical Activity: Not on file  Stress: Not on file  Social Connections: Not on file  Intimate Partner  Violence: Not At Risk (11/12/2021)   Humiliation, Afraid, Rape, and Kick questionnaire    Fear of Current or Ex-Partner: No    Emotionally Abused: No    Physically Abused: No    Sexually Abused: No    Past Surgical History:  Procedure Laterality Date   TEMPORARY DIALYSIS CATHETER Right 11/14/2021   Procedure: TEMPORARY DIALYSIS CATHETER;  Surgeon: Algernon Huxley, MD;  Location: [facility];  Service: Cardiovascular;  Laterality: Right;    Family History  Problem Relation Age of Onset   Diabetes Mother    Hypertension Mother    Alcohol abuse Father    Glaucoma Father    Diabetes Father    Hypertension Father    Cancer Neg Hx    COPD Neg Hx    Heart disease Neg Hx    Stroke Neg Hx     Allergies  Allergen Reactions   Talc        Latest Ref Rng & Units 03/24/2022   12:07 PM 02/27/2022    9:31 AM 12/15/2021   11:36 AM  CBC  WBC 4.0 - 10.5 K/uL 5.7  5.3  3.4   Hemoglobin 13.0 - 17.0 g/dL 13.1  12.1  9.8   Hematocrit 39.0 - 52.0 % 45.9   40.1  32.3   Platelets 150 - 400 K/uL 125  157  170       CMP     Component Value Date/Time   NA 135 03/24/2022 1207   NA 142 08/07/2014 0848   K 3.2 (L) 03/24/2022 1207   CL 90 (L) 03/24/2022 1207   CO2 36 (H) 03/24/2022 1207   GLUCOSE 116 (H) 03/24/2022 1207   BUN 52 (H) 03/24/2022 1207   BUN 23 08/07/2014 0848   CREATININE 2.78 (H) 03/24/2022 1207   CREATININE 2.96 (H) 03/16/2022 1102   CALCIUM 8.9 03/24/2022 1207   PROT 6.8 03/24/2022 1207   PROT 6.3 08/07/2014 0848   ALBUMIN 3.0 (L) 03/24/2022 1207   ALBUMIN 3.3 (L) 08/07/2014 0848   AST 19 03/24/2022 1207   ALT 16 03/24/2022 1207   ALKPHOS 97 03/24/2022 1207   BILITOT 1.5 (H) 03/24/2022 1207   BILITOT 0.5 08/07/2014 0848   GFRNONAA 26 (L) 03/24/2022 1207   GFRNONAA 42 (L) 10/05/2018 0755   GFRAA 48 (L) 10/05/2018 0755     No results found.     Assessment & Plan:   1. CKD (chronic kidney disease), stage IV ([place]) I had a discussion with the patient's dialysis treatment options once again.  I did make it clear to the patient that actually starting dialysis is up to his nephrologist and it also depends on his progression with his kidney disease.  We discussed that with the fistula placement this can stay in place until he is ready to utilize and we have some patients that have had the fistula placed for 1 to 2 years prior to needing dialysis access.  Following this discussion the patient seems to be agreeable to move forward with dialysis access.  We will contact the patient to have his right brachiocephalic AV fistula scheduled  2. Hyperlipidemia associated with type 2 diabetes mellitus ([place]) Continue statin as ordered and reviewed, no changes at this time  3. Controlled type 2 diabetes mellitus with stage 4 chronic kidney disease, with long-term current use of insulin (HCC) Continue hypoglycemic medications as already ordered, these medications have been reviewed and there are no changes at this time.  Hgb A1C to  be monitored as already arranged by primary service   Current Outpatient Medications on File Prior to Visit  Medication Sig Dispense Refill   albuterol (PROVENTIL) (2.5 MG/3ML) 0.083% nebulizer solution Take 3 mLs (2.5 mg total) by nebulization every 6 (six) hours as needed for wheezing or shortness of breath. 150 mL 3   albuterol (VENTOLIN HFA) 108 (90 Base) MCG/ACT inhaler Inhale 2 puffs into the lungs every 4 (four) hours as needed for wheezing or shortness of breath. 18 g 3   allopurinol (ZYLOPRIM) 100 MG tablet Take 2 tablets (200 mg total) by mouth daily. 180 tablet 0   atorvastatin (LIPITOR) 20 MG tablet Take 1 tablet (20 mg total) by mouth daily. 90 tablet 3   blood glucose meter kit and supplies KIT Brand of choice; LON 99 months; check FSBS 3x a day; E11.65; disp one meter with 100 strips + 1 refill of strips 1 each 0   Blood Glucose Monitoring Suppl (ONE TOUCH ULTRA 2) w/Device KIT Use to check blood glucose up to 3 x daily 1 kit 0   bumetanide (BUMEX) 1 MG tablet Take by mouth. Take 2 tablets (2 mg total) by mouth in the morning and 2 tablets (2 mg total) at noon and 2 tablets (2 mg total) in the evening.     calcitRIOL (ROCALTROL) 0.5 MCG capsule Take 2 capsules (1 mcg total) by mouth 1 (one) time each day     carvedilol (COREG) 25 MG tablet Take 2 tablets (50 mg total) by mouth 2 (two) times daily with a meal. 360 tablet 1   cyanocobalamin (VITAMIN B12) 1000 MCG tablet Take 1 tablet (1,000 mcg total) by mouth daily. 90 tablet 1   cyclobenzaprine (FLEXERIL) 10 MG tablet Take 1 tablet (10 mg total) by mouth 3 (three) times daily as needed for muscle spasms. 60 tablet 2   Dulaglutide (TRULICITY) A999333 0000000 SOPN INJECT 0.'75MG'$  (0.5ML) UNDER THE SKIN ONCE A WEEK 6 mL 1   esomeprazole (NEXIUM) 40 MG capsule Take 1 capsule (40 mg total) by mouth 2 (two) times daily before a meal. 180 capsule 0   ferrous gluconate (FERGON) 324 MG tablet Take 1 tablet (324 mg total) by mouth daily with  breakfast. 100 tablet 0   FLUoxetine (PROZAC) 10 MG capsule Take 10 mg by mouth daily.     FLUoxetine (PROZAC) 10 MG tablet Take 1 tablet (10 mg total) by mouth daily. 30 tablet 1   fluticasone (FLONASE) 50 MCG/ACT nasal spray Place 2 sprays into both nostrils daily. Use for 4-6 weeks then stop and use seasonally or as needed. 16 g 3   hydrocortisone 1 % ointment Apply 1 Application topically 2 (two) times daily. 30 g 0   metolazone (ZAROXOLYN) 5 MG tablet Take 1 tablet (5 mg total) by mouth 1 (one) time each day if needed (for weight gain)     OneTouch Delica Lancets 99991111 MISC Use to check blood sugar 3 x daily 300 each 3   ONETOUCH ULTRA test strip Check blood sugar 3 x daily 300 each 3   oxyCODONE (OXY IR/ROXICODONE) 5 MG immediate release tablet Take 1 tablet (5 mg total) by mouth every 6 (six) hours as needed for severe pain. 20 tablet 0   potassium chloride (KLOR-CON) 10 MEQ tablet Take 1 tablet (10 mEq total) by mouth daily. 30 tablet 2   simethicone (MYLICON) 0000000 MG chewable tablet Chew 125 mg by mouth every 6 (six) hours as needed for flatulence.  No current facility-administered medications on file prior to visit.    There are no Patient Instructions on file for this visit. No follow-ups on file.   Kris Hartmann, NP

## 2022-04-06 NOTE — Telephone Encounter (Signed)
Message from Shan Levans sent at 04/06/2022  8:11 AM EDT  Summary: congestion   Patient states that he has had congestion for 2+weeks. Patient is seeking advise on how to manage symptoms.          Call History   Type Contact Phone/Fax User  04/06/2022 08:11 AM EDT Phone (Incoming) Fitzgerald, Darren [place]elot (Self) [phone] (H) Mabe, Sherie Don

## 2022-04-06 NOTE — Telephone Encounter (Signed)
Attempted to return his call.  Left a voicemail to call back. 

## 2022-04-07 ENCOUNTER — Ambulatory Visit: Payer: PPO | Admitting: Internal Medicine

## 2022-04-09 ENCOUNTER — Telehealth: Payer: Self-pay | Admitting: Internal Medicine

## 2022-04-09 ENCOUNTER — Ambulatory Visit: Payer: Self-pay

## 2022-04-09 NOTE — Telephone Encounter (Signed)
Pt wants to know if [name] can prescribe him something for pain other than Tylenol. Pt says he has to take at least 6 Tylenol to help with his pain and it causes him constipation. Please follow up with pt.

## 2022-04-09 NOTE — Telephone Encounter (Signed)
No pain meds can be given.

## 2022-04-09 NOTE — Telephone Encounter (Signed)
  Chief Complaint: abdominal pain Symptoms: abdominal pain and cramping, distention and hard Frequency: 2 days  Pertinent Negatives: Patient denies vomiting  Disposition: [] ED /[] Urgent Care (no appt availability in office) / [x] Appointment(In office/virtual)/ []  [place] [facility] [place] [facility]/ [] Home Care/ [] Refused Recommended Disposition /[] [place] [facility]/ []  Follow-up with PCP Additional Notes: pt states that he doesn't feel constipated but gets relief when having BM, just had BM earlier today. Pt is asking for something different for pain besides tylenol. Scheduled appt for tomorrow at 1100 d/t pt couldn't come in today.pt advised that message has already been sent back for pain medicine advice.   Reason for Disposition  [1] MODERATE pain (e.g., interferes with normal activities) AND [2] pain comes and goes (cramps) AND [3] present > 24 hours  (Exception: Pain with Vomiting or Diarrhea - see that Guideline.)  Answer Assessment - Initial Assessment Questions 1. LOCATION: "Where does it hurt?"      Abdomen  3. ONSET: "When did the pain begin?" (Minutes, hours or days ago)      2 days  5. PATTERN "Does the pain come and go, or is it constant?"    - If it comes and goes: "How long does it last?" "Do you have pain now?"     (Note: Comes and goes means the pain is intermittent. It goes away completely between bouts.)    - If constant: "Is it getting better, staying the same, or getting worse?"      (Note: Constant means the pain never goes away completely; most serious pain is constant and gets worse.)       6. SEVERITY: "How bad is the pain?"  (e.g., Scale 1-10; mild, moderate, or severe)    - MILD (1-3): Doesn't interfere with normal activities, abdomen soft and not tender to touch.     - MODERATE (4-7): Interferes with normal activities or awakens from sleep, abdomen tender to touch.     - SEVERE (8-10): Excruciating pain, doubled over, unable to do any normal activities.        9.  RELIEVING/AGGRAVATING FACTORS: "What makes it better or worse?" (e.g., antacids, bending or twisting motion, bowel movement)     BM gives slight relief  10. OTHER SYMPTOMS: "Do you have any other symptoms?" (e.g., back pain, diarrhea, fever, urination pain, vomiting)       Distention and hard  Protocols used: Abdominal Pain - Male-A-AH

## 2022-04-10 ENCOUNTER — Encounter: Payer: Self-pay | Admitting: Internal Medicine

## 2022-04-10 ENCOUNTER — Ambulatory Visit: Payer: Self-pay

## 2022-04-10 ENCOUNTER — Ambulatory Visit (INDEPENDENT_AMBULATORY_CARE_PROVIDER_SITE_OTHER): Payer: PPO | Admitting: Internal Medicine

## 2022-04-10 VITALS — BP 122/78 | HR 87 | Temp 98.4°F | Resp 20 | Ht 71.75 in

## 2022-04-10 DIAGNOSIS — K5903 Drug induced constipation: Secondary | ICD-10-CM | POA: Diagnosis not present

## 2022-04-10 DIAGNOSIS — R140 Abdominal distension (gaseous): Secondary | ICD-10-CM

## 2022-04-10 DIAGNOSIS — T402X5A Adverse effect of other opioids, initial encounter: Secondary | ICD-10-CM

## 2022-04-10 MED ORDER — NALOXEGOL OXALATE 12.5 MG PO TABS: 12.5000 mg | ORAL_TABLET | Freq: Every day | ORAL | 0 refills | Status: DC

## 2022-04-10 MED ORDER — DICYCLOMINE HCL 10 MG PO CAPS: 10.0000 mg | ORAL_CAPSULE | Freq: Three times a day (TID) | ORAL | 0 refills | Status: DC

## 2022-04-10 NOTE — Patient Instructions (Addendum)
It was great seeing you today!  Plan discussed at today's visit: -Stop Trulicity  -Start a medication to help with opoid induced constipation - Movantik 12.5 mg daily -Bentyl prescribed for gas -Keep Nephrology appointment   Follow up in: as needed  Take care and let us know if you have any questions or concerns prior to your next visit.  [name]

## 2022-04-10 NOTE — Telephone Encounter (Signed)
  Chief Complaint: Wants pain medication, Cough and "diabetic belly". Symptoms: Above Frequency: ongoing Pertinent Negatives: Patient denies  Disposition: [] ED /[] Urgent Care (no appt availability in office) / [x] Appointment(In office/virtual)/ []  [place] [facility]/ [] Home Care/ [] Refused Recommended Disposition /[] [place] [facility]/ []  Follow-up with PCP Additional Notes: PT called regarding pain medication. He would like more tramadol.  He also mentions that he has "Diabetic belly". He states his very is hard. Pt was also coughing a lot. He states that cough is from the medicine. Pt has appt for today at 11:00.  Reason for Disposition  Prescription request for new medicine (not a refill)  Answer Assessment - Initial Assessment Questions 1. DRUG NAME: "What medicine do you need to have refilled?"     Pain medication 2. REFILLS REMAINING: "How many refills are remaining?" (Note: The label on the medicine or pill bottle will show how many refills are remaining. If there are no refills remaining, then a renewal may be needed.)     none 3. EXPIRATION DATE: "What is the expiration date?" (Note: The label states when the prescription will expire, and thus can no longer be refilled.)      4. PRESCRIBING HCP: "Who prescribed it?" Reason: If prescribed by specialist, call should be referred to that group.      5. SYMPTOMS: "Do you have any symptoms?"     Pain  Protocols used: Medication Refill and Renewal Call-A-AH

## 2022-04-10 NOTE — Progress Notes (Signed)
Acute Office Visit  Subjective:     Patient ID: [name], male    DOB: 02-04-1965, 57 y.o.   MRN: [id number]  Chief Complaint  Patient presents with   Abdominal Pain    Bloated, hard constipated    HPI Patient is in today for abdominal pain. He was seen for acute diarrhea on 03/24/22 and was thought to have a GI viral illness at the time. However he was hypoxic then as well and was taken to the ER. Labs were baseline and he was discharged home. Today he states he's been having abdominal bloating for 3 days. His belly is hard and larger than normal but he denies abdominal pain, nausea, vomiting, changes in stools, change in appetite. Diarrhea resolved. At baseline he does have chronic opoid induced constipation. Denies shortness of breath. No urinary issues. He is making urine and denies signs of infection. He is on Bumex. He does have ESRD, saw surgery last week for fistula placement. He is seeing Nephrology next week.  Review of Systems  Constitutional:  Negative for chills and fever.  Respiratory:  Negative for shortness of breath.   Cardiovascular:  Positive for leg swelling.  Gastrointestinal:  Positive for constipation. Negative for abdominal pain, diarrhea, heartburn, nausea and vomiting.  Genitourinary:  Negative for dysuria, flank pain, frequency, hematuria and urgency.        Objective:    BP 122/78   Pulse 87   Temp 98.4 F (36.9 C)   Resp 20   Ht 5' 11.75" (1.822 m)   SpO2 (!) 85%   BMI 44.52 kg/m  BP Readings from Last 3 Encounters:  04/10/22 122/78  04/06/22 (!) 137/92  03/31/22 (!) 136/95   Wt Readings from Last 3 Encounters:  03/31/22 (!) 326 lb (147.9 kg)  03/30/22 (!) 345 lb (156.5 kg)  03/24/22 (!) 326 lb (147.9 kg)      Physical Exam Constitutional:      Appearance: He is well-developed. He is obese.     Comments: Presents in wheelchair   HENT:     Head: Normocephalic and atraumatic.  Eyes:     Conjunctiva/sclera: Conjunctivae  normal.  Cardiovascular:     Rate and Rhythm: Normal rate and regular rhythm.  Pulmonary:     Effort: Pulmonary effort is normal.     Breath sounds: Normal breath sounds.  Abdominal:     General: There is distension.     Tenderness: There is no abdominal tenderness. There is no right CVA tenderness, left CVA tenderness, guarding or rebound.     Comments: Abdomen distended but hard, no fluid wave present   Musculoskeletal:     Comments: Legs wrapped, chronic swelling but unchanged   Skin:    General: Skin is warm and dry.  Neurological:     General: No focal deficit present.     Mental Status: He is alert. Mental status is at baseline.  Psychiatric:        Mood and Affect: Mood normal.     No results found for any visits on 04/10/22.      Assessment & Plan:   1. Abdominal distention/Therapeutic opioid induced constipation: Abdomen with hard swelling but no pain or other symptoms, no concerns for acute abdomen on exam. No fluid wave. Patient taking diuretics as directed. Patient has ESRD, but urinating appropriately and seeing Nephrology next week to discuss starting dialysis. For now, discontinue Trulicity as it could be contributing to bloating. His last A1c was controlled  at 6.2% in February. He does take opioids for pain and has chronic constipation, prescribe Movantik and Bentyl to help with constipation and gas. Patient has a follow up in a few weeks, recheck at that time.   - naloxegol oxalate (MOVANTIK) 12.5 MG TABS tablet; Take 1 tablet (12.5 mg total) by mouth daily.  Dispense: 30 tablet; Refill: 0 - dicyclomine (BENTYL) 10 MG capsule; Take 1 capsule (10 mg total) by mouth 3 (three) times daily before meals.  Dispense: 90 capsule; Refill: 0   Return for already scheduled .  Teodora Medici, DO

## 2022-04-12 ENCOUNTER — Other Ambulatory Visit: Payer: Self-pay

## 2022-04-12 ENCOUNTER — Emergency Department
Admission: EM | Admit: 2022-04-12 | Discharge: 2022-04-12 | Disposition: A | Discharge status: Home / Self Care | Payer: PPO | Attending: Emergency Medicine | Admitting: Emergency Medicine

## 2022-04-12 DIAGNOSIS — M79661 Pain in right lower leg: Secondary | ICD-10-CM | POA: Diagnosis not present

## 2022-04-12 DIAGNOSIS — G8929 Other chronic pain: Secondary | ICD-10-CM | POA: Insufficient documentation

## 2022-04-12 DIAGNOSIS — E1122 Type 2 diabetes mellitus with diabetic chronic kidney disease: Secondary | ICD-10-CM | POA: Insufficient documentation

## 2022-04-12 DIAGNOSIS — M79604 Pain in right leg: Secondary | ICD-10-CM | POA: Diagnosis not present

## 2022-04-12 DIAGNOSIS — I130 Hypertensive heart and chronic kidney disease with heart failure and stage 1 through stage 4 chronic kidney disease, or unspecified chronic kidney disease: Secondary | ICD-10-CM | POA: Insufficient documentation

## 2022-04-12 DIAGNOSIS — I509 Heart failure, unspecified: Secondary | ICD-10-CM | POA: Diagnosis not present

## 2022-04-12 DIAGNOSIS — N189 Chronic kidney disease, unspecified: Secondary | ICD-10-CM | POA: Insufficient documentation

## 2022-04-12 DIAGNOSIS — R2241 Localized swelling, mass and lump, right lower limb: Secondary | ICD-10-CM | POA: Diagnosis not present

## 2022-04-12 DIAGNOSIS — M7989 Other specified soft tissue disorders: Secondary | ICD-10-CM | POA: Diagnosis not present

## 2022-04-12 DIAGNOSIS — J45909 Unspecified asthma, uncomplicated: Secondary | ICD-10-CM | POA: Diagnosis not present

## 2022-04-12 MED ORDER — ACETAMINOPHEN 500 MG PO TABS
1000.0000 mg | ORAL_TABLET | Freq: Once | ORAL | Status: AC
  Administered 2022-04-12: 1000 mg via ORAL
  Filled 2022-04-12: qty 2

## 2022-04-12 NOTE — ED Triage Notes (Signed)
Pt reports is a diabetic and wraps his legs and uses compression socks. Pt reports he has to wear the compression socks and the cause pain and he is out of tramadol. Pt reports his primary doctor will not prescribe it. Pt reports he needs his meds for pain.  Pt wants tramadol 100mg , qty 30. He states that gabapentin does not work and neither does tylenol. Pt reports if we fill it, 30 of them will usually last 3-4 months.

## 2022-04-12 NOTE — ED Provider Notes (Signed)
[facility] Provider Note    Event Date/Time   First MD Initiated Contact with Patient 04/12/22 1111     (approximate)   History   Chief Complaint Leg Swelling   HPI  KENDLE BRUSTER is a 57 y.o. male with past medical history of hypertension, hyperlipidemia, diabetes, CKD, CHF, asthma, anemia, and chronic pain syndrome who presents to the ED complaining of leg swelling.  Patient reports that he has had 2 days of increasing pain and swelling in his right leg.  He states that he has dealt with this issue in the past and was previously prescribed tramadol which helped with the pain, but now states his PCP will not prescribe this for him.  He states that both of his legs are swollen, but the right is worse than the left.  He denies any injuries to the leg, has been able to bear weight on it.  He has been wearing compression stocking intermittently, reports compliance with his diuretic medication.  He denies any chest pain or difficulty breathing.     Physical Exam   Triage Vital Signs: ED Triage Vitals  Enc Vitals Group     BP 04/12/22 1024 (!) 143/97     Pulse Rate 04/12/22 1024 82     Resp 04/12/22 1024 20     Temp 04/12/22 1024 98.3 F (36.8 C)     Temp Source 04/12/22 1024 Oral     SpO2 04/12/22 1024 90 %     Weight 04/12/22 1021 (!) 330 lb 11 oz (150 kg)     Height 04/12/22 1021 5\' 11"  (1.803 m)     Head Circumference --      Peak Flow --      Pain Score 04/12/22 1021 10     Pain Loc --      Pain Edu? --      Excl. in [place]? --     Most recent vital signs: Vitals:   04/12/22 1024  BP: (!) 143/97  Pulse: 82  Resp: 20  Temp: 98.3 F (36.8 C)  SpO2: 90%    Constitutional: Alert and oriented. Eyes: Conjunctivae are normal. Head: Atraumatic. Nose: No congestion/rhinnorhea. Mouth/Throat: Mucous membranes are moist.  Cardiovascular: Normal rate, regular rhythm. Grossly normal heart sounds.  2+ radial and DP pulses bilaterally. Respiratory:  Normal respiratory effort.  No retractions. Lungs CTAB. Gastrointestinal: Soft and nontender. No distention. Musculoskeletal: 1+ pitting edema to left knee, 2+ pitting edema to right knee.  No associated erythema, warmth, or tenderness noted. Neurologic:  Normal speech and language. No gross focal neurologic deficits are appreciated.    ED Results / Procedures / Treatments   Labs (all labs ordered are listed, but only abnormal results are displayed) Labs Reviewed - No data to display   PROCEDURES:  Critical Care performed: No  Procedures   MEDICATIONS ORDERED IN ED: Medications - No data to display   IMPRESSION / MDM / [place] / ED COURSE  I reviewed the triage vital signs and the nursing notes.                              57 y.o. male with past medical history of hypertension, hyperlipidemia, diabetes, CHF, CKD, asthma, anemia, and chronic pain syndrome who presents to the ED complaining of increasing pain and swelling in his right leg over the past couple of days, which is acute on chronic.  Patient's  presentation is most consistent with acute, uncomplicated illness.  Differential diagnosis includes, but is not limited to, DVT, arterial insufficiency, venous insufficiency, cellulitis, abscess, peripheral edema, chronic pain.  Patient nontoxic-appearing and in no acute distress, vital signs are unremarkable.  He is neurovascularly intact to his distal right lower extremity, does have edema that is slightly worse compared to the left.  No signs of cellulitis or abscess.  Patient was offered ultrasound and labs to further assess for DVT, worsening CKD, or electrolyte abnormality contributing to increasing pain and swelling.  He states he has had ultrasounds in the past that were normal, he is not interested in another ultrasound.  He is primarily interested in a prescription for pain medicine and I explained to him that we cannot manage chronic pain from the ED.  He  was not interested in further workup if he will not be provided prescription for pain medication, is requesting to be discharged home.  He was counseled to follow-up with his PCP, was also provided with referral to pain management.  He was counseled to return to the ED for new or worsening symptoms, patient agrees with plan.      FINAL CLINICAL IMPRESSION(S) / ED DIAGNOSES   Final diagnoses:  Leg swelling  Chronic pain of right lower extremity     Rx / DC Orders   ED Discharge Orders     None        Note:  This document was prepared using Dragon voice recognition software and may include unintentional dictation errors.   Blake Divine, MD 04/12/22 1225

## 2022-04-13 ENCOUNTER — Telehealth (INDEPENDENT_AMBULATORY_CARE_PROVIDER_SITE_OTHER): Payer: Self-pay

## 2022-04-13 ENCOUNTER — Telehealth: Payer: Self-pay | Admitting: Internal Medicine

## 2022-04-13 NOTE — Telephone Encounter (Signed)
Medication Refill - Medication:  traMADol (ULTRAM) tablet 50 mg     Has the patient contacted their pharmacy? Yes.    Preferred Pharmacy (with phone number or street name):  [facility], [address]  [place] [address]  Phone: [phone] Fax: [phone]  Hours: Not open 24 hours    Has the patient been seen for an appointment in the last year OR does the patient have an upcoming appointment? Yes.    Agent: Please be advised that RX refills may take up to 3 business days. We ask that you follow-up with your pharmacy.

## 2022-04-13 NOTE — Telephone Encounter (Signed)
Spoke with the patient to schedule him for a left brachiocephalic AVF with Dr. Lucky Cowboy. The patient stated he needs a permcath placed and has an appt with his nephrologist this week. I explained that an order will need to be sent over to have this placed. Patient stated he will call back this Thursday.

## 2022-04-13 NOTE — Telephone Encounter (Signed)
Copied from [place] [phone]. Topic: Appointment Scheduling - Scheduling Inquiry for Clinic >> Apr 13, 2022  2:11 PM Barbie Haggis wrote: Reason for CRM:  patient was in the ER 04/13/2022, patient would like to be seen for a hospital follow up  but there are no appointments available before April 1 for Dr Rosana Berger.

## 2022-04-13 NOTE — Telephone Encounter (Signed)
Sooner appt sch'd w/ Dr Rosana Berger for 3.26.2024

## 2022-04-13 NOTE — Telephone Encounter (Signed)
Left detailed vm.  [name] will not refill tramadol and he has been told multiple times we do not do pain medications or do controlled meds.

## 2022-04-14 ENCOUNTER — Other Ambulatory Visit: Payer: Self-pay | Admitting: Internal Medicine

## 2022-04-14 ENCOUNTER — Ambulatory Visit: Payer: Self-pay

## 2022-04-14 ENCOUNTER — Ambulatory Visit: Payer: PPO | Admitting: Podiatry

## 2022-04-14 DIAGNOSIS — R0602 Shortness of breath: Secondary | ICD-10-CM

## 2022-04-14 NOTE — Telephone Encounter (Signed)
Medication Refill - Medication: albuterol (PROVENTIL) (2.5 MG/3ML) 0.083% nebulizer solution   Has the patient contacted their pharmacy? Yes.   (Agent: If no, request that the patient contact the pharmacy for the refill. If patient does not wish to contact the pharmacy document the reason why and proceed with request.) (Agent: If yes, when and what did the pharmacy advise?)  Preferred Pharmacy (with phone number or street name):  [facility], [address]  [place] [address]  Phone: [phone] Fax: [phone]   Has the patient been seen for an appointment in the last year OR does the patient have an upcoming appointment? Yes.    Agent: Please be advised that RX refills may take up to 3 business days. We ask that you follow-up with your pharmacy.

## 2022-04-14 NOTE — Telephone Encounter (Signed)
Patient called, left VM to return the call to the office to discuss symptoms with a nurse.  Summary: Chest congestion for weeks, seeking med advice   Pt called requesting a call back to discuss medication options for helping him get rid of the congestion that he has in his chest. Please advise

## 2022-04-14 NOTE — Telephone Encounter (Signed)
Summary: Chest congestion for weeks, seeking med advice   Pt called requesting a call back to discuss medication options for helping him get rid of the congestion that he has in his chest. Please advise          2nd call attempted to contact patient on [phone] to review sx of cough. No answer. LVMTCB [phone].

## 2022-04-15 ENCOUNTER — Other Ambulatory Visit: Payer: PPO | Admitting: Pharmacist

## 2022-04-15 DIAGNOSIS — N2581 Secondary hyperparathyroidism of renal origin: Secondary | ICD-10-CM | POA: Diagnosis not present

## 2022-04-15 DIAGNOSIS — R600 Localized edema: Secondary | ICD-10-CM | POA: Diagnosis not present

## 2022-04-15 DIAGNOSIS — I10 Essential (primary) hypertension: Secondary | ICD-10-CM | POA: Diagnosis not present

## 2022-04-15 DIAGNOSIS — N184 Chronic kidney disease, stage 4 (severe): Secondary | ICD-10-CM | POA: Diagnosis not present

## 2022-04-15 DIAGNOSIS — E1122 Type 2 diabetes mellitus with diabetic chronic kidney disease: Secondary | ICD-10-CM | POA: Diagnosis not present

## 2022-04-15 NOTE — Telephone Encounter (Signed)
Requested medication (s) are due for refill today: yes  Requested medication (s) are on the active medication list: yes  Last refill:  02/21/20  Future visit scheduled: yes  Notes to clinic:  Unable to refill per protocol, last refill by another provider at [facility], routing for review.     Requested Prescriptions  Pending Prescriptions Disp Refills   albuterol (PROVENTIL) (2.5 MG/3ML) 0.083% nebulizer solution 150 mL 3    Sig: Take 3 mLs (2.5 mg total) by nebulization every 6 (six) hours as needed for wheezing or shortness of breath.     Pulmonology:  Beta Agonists 2 Failed - 04/14/2022  4:55 PM      Failed - Last BP in normal range    BP Readings from Last 1 Encounters:  04/12/22 (!) 143/97         Passed - Last Heart Rate in normal range    Pulse Readings from Last 1 Encounters:  04/12/22 82         Passed - Valid encounter within last 12 months    Recent Outpatient Visits           5 days ago Abdominal distention   [facility], DO   2 weeks ago Localized swelling of left lower leg   [place], PA-C   3 weeks ago Diarrhea, unspecified type   [place], PA-C   3 weeks ago No-show for appointment   [facility] Mecum, Dani Gobble, PA-C   1 month ago CKD (chronic kidney disease), stage IV [facility])   [facility] Teodora Medici, DO       Future Appointments             In 6 days Teodora Medici, [facility], [place]   In 1 week Teodora Medici, [facility], [place]   In 2 months Teodora Medici, [facility], [facility]

## 2022-04-15 NOTE — Progress Notes (Signed)
04/15/2022 Name: [name] MRN: [id number] DOB: Oct 20, 1965  Chief Complaint  Patient presents with   Medication Assistance    [name] is a 57 y.o. year old male who presented for a telephone visit.   They were referred to the pharmacist by their PCP for assistance in managing medication access.    Subjective:  Care Team: Primary Care Provider: Teodora Medici, DO; Next Scheduled Visit: 04/21/2022 Cardiologist: [facility] [place]; Next Scheduled Visit: 08/03/2022 Nephrologist: Murlean Iba, MD; Next Scheduled Visit: Today Vascular Specialist: Algernon Huxley, MD Hematologist: Earlie Server, MD  Medication Access/Adherence  Current Pharmacy:  [facility], [address] [place] [address] Phone: [phone] Fax: [place] [place] [address] Phone: [phone] Fax: [phone]   Patient reports affordability concerns with their medications: No  Patient reports access/transportation concerns to their pharmacy: No  Patient reports adherence concerns with their medications:  No     Diabetes:   Current medications:  None - Trulicity [id number] mg weekly currently ON HOLD Medications tried in the past: insulin glargine (hypoglycemia)   Current glucose readings: Denies monitoring home blood sugar recently    Current medication access support:  - Collaborated with PCP and [facility] CPhT Sharee Pimple Simcox to aid patient with application for re-enrollment in [place] patient assistance program for Trulicity for 123456 calendar year - Per message from CPhT on 3/1, patient approved for re-enrollment in program through 01/26/2023 - Today patient reports received a shipment of Trulicity from program this month, but confirms currently not taking Trulicity as recommended by PCP   Statin therapy: atorvastatin 20 mg daily   Objective:  Lab  Results  Component Value Date   HGBA1C 6.2 (H) 03/16/2022    Lab Results  Component Value Date   CREATININE 2.78 (H) 03/24/2022   BUN 52 (H) 03/24/2022   NA 135 03/24/2022   K 3.2 (L) 03/24/2022   CL 90 (L) 03/24/2022   CO2 36 (H) 03/24/2022    Lab Results  Component Value Date   CHOL 110 05/29/2021   HDL 66 05/29/2021   LDLCALC 29 05/29/2021   TRIG 73 05/29/2021   CHOLHDL 1.7 05/29/2021    Medications Reviewed Today     Reviewed by Teodora Medici, DO (Physician) on 04/10/22 at 1231  Med List Status: <None>   Medication Order Taking? Sig Documenting Provider Last Dose Status Informant  albuterol (PROVENTIL) (2.5 MG/3ML) 0.083% nebulizer solution [id number] Yes Take 3 mLs (2.5 mg total) by nebulization every 6 (six) hours as needed for wheezing or shortness of breath. [name], DO Taking Active   albuterol (VENTOLIN HFA) 108 (90 Base) MCG/ACT inhaler [id number] Yes Inhale 2 puffs into the lungs every 4 (four) hours as needed for wheezing or shortness of breath. [name], DO Taking Active   allopurinol (ZYLOPRIM) 100 MG tablet [id number] Yes Take 2 tablets (200 mg total) by mouth daily. Teodora Medici, DO Taking Active   atorvastatin (LIPITOR) 20 MG tablet [id number] Yes Take 1 tablet (20 mg total) by mouth daily. [name], DO Taking Active   blood glucose meter kit and supplies KIT [id number] Yes Brand of choice; LON 99 months; check FSBS 3x a day; E11.65; disp one meter with 100 strips + 1 refill of strips [name], [name], MD Taking Active Self  Blood Glucose Monitoring Suppl (ONE TOUCH ULTRA 2) w/Device KIT [id number] Yes Use to check blood glucose up to 3 x daily Teodora Medici, DO Taking Active   bumetanide (BUMEX) 1 MG tablet [id number] Yes Take by mouth. Take 2 tablets (2 mg total) by mouth in the morning and 2 tablets (2 mg total) at noon and 2 tablets (2 mg total) in the evening. [provider] Taking Active    calcitRIOL (ROCALTROL) 0.5 MCG capsule [id number] Yes Take 2 capsules (1 mcg total) by mouth 1 (one) time each day [provider] Taking Active   carvedilol (COREG) 25 MG tablet [id number] Yes Take 2 tablets (50 mg total) by mouth 2 (two) times daily with a meal. Teodora Medici, DO Taking Active   cyanocobalamin (VITAMIN B12) 1000 MCG tablet [id number] Yes Take 1 tablet (1,000 mcg total) by mouth daily. Earlie Server, MD Taking Active   cyclobenzaprine (FLEXERIL) 10 MG tablet [id number] Yes Take 1 tablet (10 mg total) by mouth 3 (three) times daily as needed for muscle spasms. [name], DO Taking Active   dicyclomine (BENTYL) 10 MG capsule [id number] Yes Take 1 capsule (10 mg total) by mouth 3 (three) times daily before meals. Teodora Medici, DO  Active   esomeprazole (NEXIUM) 40 MG capsule [id number] Yes Take 1 capsule (40 mg total) by mouth 2 (two) times daily before a meal. [place] Ranger, [name], DO Taking Active   ferrous gluconate (FERGON) 324 MG tablet [id number] Yes Take 1 tablet (324 mg total) by mouth daily with breakfast. Nena Polio, MD Taking Active   FLUoxetine (PROZAC) 10 MG capsule [id number] Yes Take 10 mg by mouth daily. [provider] Taking Active   FLUoxetine (PROZAC) 10 MG tablet [id number] Yes Take 1 tablet (10 mg total) by mouth daily. Teodora Medici, DO Taking Active   fluticasone (FLONASE) 50 MCG/ACT nasal spray [id number] Yes Place 2 sprays into both nostrils daily. Use for 4-6 weeks then stop and use seasonally or as needed. [name], DO Taking Active   hydrocortisone 1 % ointment [id number] Yes Apply 1 Application topically 2 (two) times daily. Teodora Medici, DO Taking Active   metolazone (ZAROXOLYN) 5 MG tablet [id number] Yes Take 1 tablet (5 mg total) by mouth 1 (one) time each day if needed (for weight gain) [provider] Taking Active   naloxegol oxalate (MOVANTIK) 12.5 MG TABS tablet [id number] Yes  Take 1 tablet (12.5 mg total) by mouth daily. Teodora Medici, DO  Active   OneTouch Delica Lancets 99991111 MISC [id number] Yes Use to check blood sugar 3 x daily Teodora Medici, DO Taking Active   [facility] ULTRA test strip [id number] Yes Check blood sugar 3 x daily Teodora Medici, DO Taking Active   oxyCODONE (OXY IR/ROXICODONE) 5 MG immediate release tablet [id number] Yes Take 1 tablet (5 mg total) by mouth every 6 (six) hours as needed for severe pain. [name], DO Taking Active   potassium chloride (KLOR-CON) 10 MEQ tablet [id number] Yes Take 1 tablet (10 mEq total) by mouth daily. [name], DO Taking Active   simethicone (MYLICON) 0000000 MG chewable tablet [id number] Yes Chew 125 mg by mouth every 6 (six) hours as needed for flatulence. [provider] Taking Active               Assessment/Plan:   Unable to review medications today as patient riding in a car on the way to his Nephrology appointment  Diabetes: - Currently controlled - Recommend to keep  log of blood sugar results when monitors at home, bring this record with him to upcoming medical appointments and to contact office sooner for readings outside of established parameters or symptoms      Follow Up Plan:   1) Patient denies further medication questions or concerns today. Patient states will call if needs something 2) Provide patient with contact information for clinic pharmacist to contact if needed in future for medication questions/concerns 3) Will schedule follow up appointment with patient in November to follow up in case needing patient assistance support at that time    Wallace Cullens, PharmD, [place] [phone]

## 2022-04-17 ENCOUNTER — Telehealth: Payer: Self-pay

## 2022-04-17 ENCOUNTER — Ambulatory Visit: Payer: Self-pay

## 2022-04-17 ENCOUNTER — Ambulatory Visit (INDEPENDENT_AMBULATORY_CARE_PROVIDER_SITE_OTHER): Payer: PPO | Admitting: Physician Assistant

## 2022-04-17 ENCOUNTER — Encounter: Payer: Self-pay | Admitting: Physician Assistant

## 2022-04-17 VITALS — BP 110/72 | HR 79

## 2022-04-17 DIAGNOSIS — R600 Localized edema: Secondary | ICD-10-CM

## 2022-04-17 DIAGNOSIS — R0902 Hypoxemia: Secondary | ICD-10-CM

## 2022-04-17 DIAGNOSIS — L03115 Cellulitis of right lower limb: Secondary | ICD-10-CM | POA: Diagnosis not present

## 2022-04-17 MED ORDER — SULFAMETHOXAZOLE-TRIMETHOPRIM 800-160 MG PO TABS: 1.0000 | ORAL_TABLET | Freq: Two times a day (BID) | ORAL | 0 refills | Status: AC

## 2022-04-17 MED ORDER — ALBUTEROL SULFATE (2.5 MG/3ML) 0.083% IN NEBU: 2.5000 mg | INHALATION_SOLUTION | Freq: Four times a day (QID) | RESPIRATORY_TRACT | 3 refills | Status: DC | PRN

## 2022-04-17 NOTE — Telephone Encounter (Signed)
  Chief Complaint: Rt leg pain and muscle knots Symptoms: above Frequency: 1 month Pertinent Negatives: Patient denies SOB Disposition: [] ED /[] Urgent Care (no appt availability in office) / [x] Appointment(In office/virtual)/ []  [place] [facility]/ [] Home Care/ [] Refused Recommended Disposition /[] [place] [facility]/ []  Follow-up with PCP Additional Notes: PT states that the right leg has pain of 50/10. He also has muscle knots/lumps on the leg. PT has a cough.      Reason for Disposition  [1] SEVERE pain (e.g., excruciating, unable to do any normal activities) AND [2] not improved after 2 hours of pain medicine  Answer Assessment - Initial Assessment Questions 1. ONSET: "When did the pain start?"      1 month 2. LOCATION: "Where is the pain located?"      Was both legs , now right leg only 3. PAIN: "How bad is the pain?"    (Scale 1-10; or mild, moderate, severe)   -  MILD (1-3): doesn't interfere with normal activities    -  MODERATE (4-7): interferes with normal activities (e.g., work or school) or awakens from sleep, limping    -  SEVERE (8-10): excruciating pain, unable to do any normal activities, unable to walk     50/10 4. WORK OR EXERCISE: "Has there been any recent work or exercise that involved this part of the body?"       5. CAUSE: "What do you think is causing the leg pain?"     Thinks it is gout 6. OTHER SYMPTOMS: "Do you have any other symptoms?" (e.g., chest pain, back pain, breathing difficulty, swelling, rash, fever, numbness, weakness)     Swelling of the right leg, cough  Protocols used: Leg Pain-A-AH

## 2022-04-17 NOTE — Telephone Encounter (Signed)
     Patient  visit on 04/12/2022  at [facility] was for leg swelling.  Have you been able to follow up with your primary care physician? Yes  The patient was or was not able to obtain any needed medicine or equipment. No medication prescribed.  Are there diet recommendations that you are having difficulty following? No. Patient did request food pantry list be mailed to his home address.  Patient expresses understanding of discharge instructions and education provided has no other needs at this time.  Yes   [place] Resource Care Guide   [email or web address]  ?? [email or web address]   Website: [email or web address]  [email or web address]

## 2022-04-17 NOTE — Patient Instructions (Signed)
Please tell the ED that your provider today was concerned for a DVT in your right leg due to swelling and pain that is not improving with diuretics  Your oxygen saturation is low as well so I would like to make sure you do not have  a PE  I have sent in Bactrim for suspected cellulitis   You can take Tylenol as needed for pain - do not take NSAIDs or anti-inflammatories due to your kidney function.

## 2022-04-17 NOTE — Progress Notes (Unsigned)
Acute Office Visit   Patient: [name]   DOB: 05/05/1965   57 y.o. Male  MRN: [id number] Visit Date: 04/17/2022  Today's healthcare provider: Dani Gobble Aveya Beal, PA-C  Introduced myself to the patient as a Journalist, newspaper and provided education on APPs in clinical practice.    Chief Complaint  Patient presents with   Leg Swelling    Patient says he has swelling and pain in his R leg. Patient says he thought it was from his Diabetes and says he has been going back to forth to the hospital. Patient says he needs some anti-inflammatory for his pain and swelling.    Subjective    HPI HPI     Leg Swelling    Additional comments: Patient says he has swelling and pain in his R leg. Patient says he thought it was from his Diabetes and says he has been going back to forth to the hospital. Patient says he needs some anti-inflammatory for his pain and swelling.       Last edited by Irena Reichmann, [place] on 04/17/2022  9:38 AM.       Right let swelling and pain   He reports he has had lower leg pain and swelling for about a month He reports his left leg is not like this  He reports his lower right leg is very tender to touch and socks are cutting into it He has ace bandages wrapped around both legs- unsure if this is for makeshift compression  After reviewing ED visit notes - he declined DVT exam of right leg and was discharged   He declines EMS services today after they were offered 3 different times during apt- reports he has a ride that can take him to the ED if he decides to go.  He reports he might try to go early in the AM so he can be seen quicker in the ED  We discussed potential complications and threats to life if he does have a DVT or PE and importance of prompt evaluation and staying in the ED for full evaluation and testing   Patient states he does not really care if he dies - denies active plans to hurt himself but states "if I died right now, I wouldn't bat an eye. The  pain I deal with, you don't understand" I tried to have a discussion about this with him and offered referral to paliative or hospice services but he declined this today       Medications: Outpatient Medications Prior to Visit  Medication Sig   albuterol (VENTOLIN HFA) 108 (90 Base) MCG/ACT inhaler Inhale 2 puffs into the lungs every 4 (four) hours as needed for wheezing or shortness of breath.   allopurinol (ZYLOPRIM) 100 MG tablet Take 2 tablets (200 mg total) by mouth daily.   atorvastatin (LIPITOR) 20 MG tablet Take 1 tablet (20 mg total) by mouth daily.   blood glucose meter kit and supplies KIT Brand of choice; LON 99 months; check FSBS 3x a day; E11.65; disp one meter with 100 strips + 1 refill of strips   Blood Glucose Monitoring Suppl (ONE TOUCH ULTRA 2) w/Device KIT Use to check blood glucose up to 3 x daily   bumetanide (BUMEX) 1 MG tablet Take by mouth. Take 2 tablets (2 mg total) by mouth in the morning and 2 tablets (2 mg total) at noon and 2 tablets (2 mg total) in the evening.  calcitRIOL (ROCALTROL) 0.5 MCG capsule Take 2 capsules (1 mcg total) by mouth 1 (one) time each day   carvedilol (COREG) 25 MG tablet Take 2 tablets (50 mg total) by mouth 2 (two) times daily with a meal.   cyanocobalamin (VITAMIN B12) 1000 MCG tablet Take 1 tablet (1,000 mcg total) by mouth daily.   cyclobenzaprine (FLEXERIL) 10 MG tablet Take 1 tablet (10 mg total) by mouth 3 (three) times daily as needed for muscle spasms.   dicyclomine (BENTYL) 10 MG capsule Take 1 capsule (10 mg total) by mouth 3 (three) times daily before meals.   esomeprazole (NEXIUM) 40 MG capsule Take 1 capsule (40 mg total) by mouth 2 (two) times daily before a meal.   ferrous gluconate (FERGON) 324 MG tablet Take 1 tablet (324 mg total) by mouth daily with breakfast.   FLUoxetine (PROZAC) 10 MG capsule Take 10 mg by mouth daily.   FLUoxetine (PROZAC) 10 MG tablet Take 1 tablet (10 mg total) by mouth daily.   fluticasone  (FLONASE) 50 MCG/ACT nasal spray Place 2 sprays into both nostrils daily. Use for 4-6 weeks then stop and use seasonally or as needed.   hydrocortisone 1 % ointment Apply 1 Application topically 2 (two) times daily.   metolazone (ZAROXOLYN) 5 MG tablet Take 1 tablet (5 mg total) by mouth 1 (one) time each day if needed (for weight gain)   naloxegol oxalate (MOVANTIK) 12.5 MG TABS tablet Take 1 tablet (12.5 mg total) by mouth daily.   OneTouch Delica Lancets 99991111 MISC Use to check blood sugar 3 x daily   ONETOUCH ULTRA test strip Check blood sugar 3 x daily   potassium chloride (KLOR-CON) 10 MEQ tablet Take 1 tablet (10 mEq total) by mouth daily.   simethicone (MYLICON) 0000000 MG chewable tablet Chew 125 mg by mouth every 6 (six) hours as needed for flatulence.   [DISCONTINUED] albuterol (PROVENTIL) (2.5 MG/3ML) 0.083% nebulizer solution Take 3 mLs (2.5 mg total) by nebulization every 6 (six) hours as needed for wheezing or shortness of breath.   gabapentin (NEURONTIN) 100 MG capsule 100 mg (Patient not taking: Reported on 04/17/2022)   oxyCODONE (OXY IR/ROXICODONE) 5 MG immediate release tablet Take 1 tablet (5 mg total) by mouth every 6 (six) hours as needed for severe pain. (Patient not taking: Reported on 04/17/2022)   No facility-administered medications prior to visit.    Review of Systems  Eyes:  Negative for visual disturbance.  Respiratory:  Positive for cough and shortness of breath. Negative for chest tightness and wheezing.   Cardiovascular:  Positive for leg swelling.  Neurological:  Negative for dizziness and headaches.       Objective    BP 110/72   Pulse 79   SpO2 (!) 80%    Physical Exam Vitals reviewed.  Constitutional:      General: He is awake.     Appearance: Normal appearance. He is well-developed and well-groomed.  HENT:     Head: Normocephalic and atraumatic.  Cardiovascular:     Rate and Rhythm: Normal rate and regular rhythm.     Heart sounds: Normal heart  sounds. No murmur heard.    No friction rub. No gallop.     Comments: Right calf: 20 in in circumference Left calf : 18 in circumference   Pulmonary:     Effort: Pulmonary effort is normal.     Breath sounds: Decreased air movement present. Examination of the right-middle field reveals decreased breath sounds. Examination of the  left-middle field reveals decreased breath sounds. Examination of the right-lower field reveals decreased breath sounds. Examination of the left-lower field reveals decreased breath sounds. Decreased breath sounds present. No wheezing, rhonchi or rales.  Musculoskeletal:     Cervical back: Normal range of motion. Normal range of motion.     Right lower leg: 4+ Edema present.     Left lower leg: 3+ Edema present.  Neurological:     Mental Status: He is alert.  Psychiatric:        Attention and Perception: He is inattentive.        Behavior: Behavior is cooperative.       No results found for any visits on 04/17/22.  Assessment & Plan      No follow-ups on file.      Problem List Items Addressed This Visit       Respiratory   Hypoxia    Likely chronic, recurrent concern Patient continues to have oxygen saturations <90% in office He is able to carry on conversation without evidence of SOB or strain  Unsure if he is using inhalers as directed as patient can often be tangential with answers to questions Concerned today for potential PE given prolonged swelling of his legs and PE findings I discussed the implications of DVT and PE with him extensively and the importance of getting proper evaluation in the ED - reiterated that he needs to stay for imaging and completion of testing for complete rule out. He declines EMS today and verbalized understanding of my instructions regarding going to the ED if he wishes to pursue May need referral to Pulmonology         Other   Cellulitis of right lower extremity - Primary    Acute, new concern Patient presents  today with swelling, erythema and tenderness of right lower extremity  We reviewed concerns for DVT and PE given exam and HPI results. Patient does not seem receptive to this information today and declined EMS to ED for further evaluation I discussed that this may be cellulitis and we can try Bactrim to assist with resolution. Patient was amenable to this course and states he may go to the ED at a later time Recommend Tylenol as needed for pain, no NSAIDs due to kidney function  Follow up as needed for persistent or progressing symptoms        Relevant Medications   sulfamethoxazole-trimethoprim (BACTRIM DS) 800-160 MG tablet   Other Visit Diagnoses     Localized edema     Chronic, ongoing Patient has bilateral pitting edema to both lower extremities but right leg is significantly worse than left Unsure if this is from fluid overload, worsening of chronic CKD, CHF, or potential vascular issue Recommend he continue taking his medications as directed, particularly Metolazone to assist with excess fluid He is established with Vascular services- may need to coordinate with them to assess for peripheral vascular disease  Follow up as needed for persistent or progressing symptoms          No follow-ups on file.   I, Vanessia Bokhari E Claude Swendsen, PA-C, have reviewed all documentation for this visit. The documentation on 04/20/22 for the exam, diagnosis, procedures, and orders are all accurate and complete.   Talitha Givens, MHS, PA-C [facility]

## 2022-04-20 ENCOUNTER — Ambulatory Visit: Payer: Self-pay | Admitting: *Deleted

## 2022-04-20 DIAGNOSIS — L03115 Cellulitis of right lower limb: Secondary | ICD-10-CM | POA: Insufficient documentation

## 2022-04-20 NOTE — Assessment & Plan Note (Addendum)
Acute, new concern Patient presents today with swelling, erythema and tenderness of right lower extremity  We reviewed concerns for DVT and PE given exam and HPI results. Patient does not seem receptive to this information today and declined EMS to ED for further evaluation I discussed that this may be cellulitis and we can try Bactrim to assist with resolution. Patient was amenable to this course and states he may go to the ED at a later time Recommend Tylenol as needed for pain, no NSAIDs due to kidney function  Follow up as needed for persistent or progressing symptoms

## 2022-04-20 NOTE — Telephone Encounter (Signed)
Pt states that his leg is still hurting and he has a question regarding the antibiotic that the PCP prescribed for him for his leg. Please call pt back.  Attempted to call patient - no answer- left message on VM to call office

## 2022-04-20 NOTE — Telephone Encounter (Signed)
2nd attempt, Patient called, left VM to return the call to the office to discuss symptoms with a nurse.  

## 2022-04-20 NOTE — Assessment & Plan Note (Addendum)
Likely chronic, recurrent concern Patient continues to have oxygen saturations <90% in office He is able to carry on conversation without evidence of SOB or strain  Unsure if he is using inhalers as directed as patient can often be tangential with answers to questions Concerned today for potential PE given prolonged swelling of his legs and PE findings I discussed the implications of DVT and PE with him extensively and the importance of getting proper evaluation in the ED - reiterated that he needs to stay for imaging and completion of testing for complete rule out. He declines EMS today and verbalized understanding of my instructions regarding going to the ED if he wishes to pursue May need referral to Pulmonology

## 2022-04-20 NOTE — Telephone Encounter (Signed)
Pt states that his leg is still hurting and he has a question regarding the antibiotic that the PCP prescribed for him for his leg. Please call pt back.    3rd attempt to contact patient [phone] and Called patient to review sx of leg pain and medication questions. No answer. LVMTCB [id number]. Please advise .  Reason for Disposition  Third attempt to contact caller AND no contact made. Phone number verified.  Answer Assessment - Initial Assessment Questions N/A 3 attempts to contact patient to review sx.  Protocols used: No Contact or Duplicate Contact Call-A-AH

## 2022-04-21 ENCOUNTER — Ambulatory Visit: Payer: Self-pay | Admitting: *Deleted

## 2022-04-21 ENCOUNTER — Ambulatory Visit: Payer: PPO | Admitting: Internal Medicine

## 2022-04-21 ENCOUNTER — Ambulatory Visit: Payer: PPO | Admitting: Physician Assistant

## 2022-04-21 NOTE — Telephone Encounter (Signed)
  Chief Complaint: Right lower extremity swelling, pain redness Symptoms: Pt. States he believes it's cellulitis.   The Bactrim DS is helping.  Seen 04/17/2022 by Talitha Givens, PA-C and put on this antibiotic. Frequency: For a month now his leg has been sore and swollen. Pertinent Negatives: Patient denies N/A Disposition: [] ED /[] Urgent Care (no appt availability in office) / [x] Appointment(In office/virtual)/ []  [place] [facility]/ [] Home Care/ [] Refused Recommended Disposition /[] [place] [facility]/ []  Follow-up with PCP Additional Notes: Made an appt. With [employer], PA-C who he saw last for today at 9:40.   No appts. With [name] until tomorrow.   He is requesting to have his leg checked to be sure nothing more is wrong with it.

## 2022-04-21 NOTE — Telephone Encounter (Signed)
Message from [facility] sent at 04/21/2022  7:51 AM EDT  Summary: leg swelling   Pt requested a callback from a nurse and stated he has a question about antibiotics for his legs. Stated it may be Infected still has swelling and he can't walk.  Pt mentioned it's been going on for about a month now.  Pt seeking clinical advice.          Call History   Type Contact Phone/Fax User  04/21/2022 07:46 AM EDT Phone (Incoming) Pfund, Okey Regal (Self) [phone] Lemmie Evens) McGill, Alondra   Reason for Disposition  Looks like a boil, infected sore, deep ulcer or other infected rash (spreading redness, pus)  Answer Assessment - Initial Assessment Questions 1. ONSET: "When did the swelling start?" (e.g., minutes, hours, days)     Right leg is swelling and painful.   He can't walk.  He is on an antibiotic.   I think I have cellulitis. 2. LOCATION: "What part of the leg is swollen?"  "Are both legs swollen or just one leg?"     Lower right leg.   The antibiotic is helping but I need to make sure it's ok.   3. SEVERITY: "How bad is the swelling?" (e.g., localized; mild, moderate, severe)   - Localized: Small area of swelling localized to one leg.   - MILD pedal edema: Swelling limited to foot and ankle, pitting edema < 1/4 inch (6 mm) deep, rest and elevation eliminate most or all swelling.   - MODERATE edema: Swelling of lower leg to knee, pitting edema > 1/4 inch (6 mm) deep, rest and elevation only partially reduce swelling.   - SEVERE edema: Swelling extends above knee, facial or hand swelling present.      Moderate 4. REDNESS: "Does the swelling look red or infected?"     Yes 5. PAIN: "Is the swelling painful to touch?" If Yes, ask: "How painful is it?"   (Scale 1-10; mild, moderate or severe)     Yes 6. FEVER: "Do you have a fever?" If Yes, ask: "What is it, how was it measured, and when did it start?"      Not asked 7. CAUSE: "What do you think is causing the leg swelling?"      Cellulitis  8. MEDICAL HISTORY: "Do you have a history of blood clots (e.g., DVT), cancer, heart failure, kidney disease, or liver failure?"     Kidney disease.    9. RECURRENT SYMPTOM: "Have you had leg swelling before?" If Yes, ask: "When was the last time?" "What happened that time?"     Yes this has been going on for a month.   The antibiotic is helping it.    10. OTHER SYMPTOMS: "Do you have any other symptoms?" (e.g., chest pain, difficulty breathing)       I can't walk a long ways.   Exertion makes me hold my breath so I feel short of breath.    I just found this out.   11. PREGNANCY: "Is there any chance you are pregnant?" "When was your last menstrual period?"       N/A  Protocols used: Leg Swelling and Edema-A-AH

## 2022-04-22 ENCOUNTER — Encounter: Payer: Self-pay | Admitting: Internal Medicine

## 2022-04-22 ENCOUNTER — Ambulatory Visit: Payer: Self-pay | Admitting: *Deleted

## 2022-04-22 ENCOUNTER — Ambulatory Visit (INDEPENDENT_AMBULATORY_CARE_PROVIDER_SITE_OTHER): Payer: PPO | Admitting: Internal Medicine

## 2022-04-22 VITALS — BP 128/72 | HR 87 | Temp 98.1°F | Resp 20 | Ht 71.75 in

## 2022-04-22 DIAGNOSIS — Z91199 Patient's noncompliance with other medical treatment and regimen due to unspecified reason: Secondary | ICD-10-CM | POA: Diagnosis not present

## 2022-04-22 DIAGNOSIS — R600 Localized edema: Secondary | ICD-10-CM | POA: Diagnosis not present

## 2022-04-22 DIAGNOSIS — F339 Major depressive disorder, recurrent, unspecified: Secondary | ICD-10-CM

## 2022-04-22 DIAGNOSIS — J3089 Other allergic rhinitis: Secondary | ICD-10-CM | POA: Diagnosis not present

## 2022-04-22 NOTE — Telephone Encounter (Signed)
Opened chart to answer question for agent.   Pt. Calling in that he missed his appt. Yesterday at 9:40.   Still c/o same issue with his leg so did not need to triage him again.   Agent has rescheduled him for today at 9:40.

## 2022-04-22 NOTE — Progress Notes (Signed)
Acute Office Visit  Subjective:     Patient ID: [name], male    DOB: 1965/07/08, 57 y.o.   MRN: [id number]  Chief Complaint  Patient presents with   Leg Pain    HPI Patient is in today for check on leg. He was prescribed Bactrim DS BID x 10 days on 04/17/22 for cellulitis. He states that his leg is doing better and only has chronic point tenderness. He does have chronic lymphedema. He had seen a wound care specialist but does not follow with them anymore. He is taking the antibiotic without issue and reports no side effects. He denies fevers or other skin changes. He continues to ask about pain medication. He is on Bumex 2 mg TID and Metolazone 5 mg daily.   Patient also wanting to discuss congestion and allergies. He states that his friend who drives him to appointments has a girlfriend who's perfume bothers the patient and causes congestion. He does have Flonase on his list but isn't using it. He does take Claritin daily.   Also discussed patient's Prozac that was started a few weeks ago for depression. Patient states he never took it and it not interested in any medications for mental health.    Review of Systems  Constitutional:  Negative for chills and fever.  HENT:  Positive for congestion.   Cardiovascular:  Positive for leg swelling.  Skin: Negative.         Objective:    BP 128/72   Pulse 87   Temp 98.1 F (36.7 C) (Oral)   Resp 20   Ht 5' 11.75" (1.822 m)   SpO2 90%   BMI 45.16 kg/m  BP Readings from Last 3 Encounters:  04/22/22 128/72  04/17/22 110/72  04/12/22 (!) 143/97   Wt Readings from Last 3 Encounters:  04/12/22 (!) 330 lb 11 oz (150 kg)  03/31/22 (!) 326 lb (147.9 kg)  03/30/22 (!) 345 lb (156.5 kg)      Physical Exam Constitutional:      Appearance: Normal appearance. He is obese.     Comments: Presents in wheelchair  HENT:     Head: Normocephalic and atraumatic.  Eyes:     Conjunctiva/sclera: Conjunctivae normal.   Cardiovascular:     Rate and Rhythm: Normal rate and regular rhythm.  Pulmonary:     Effort: Pulmonary effort is normal.     Breath sounds: Normal breath sounds.  Musculoskeletal:     Right lower leg: Edema present.     Left lower leg: Edema present.     Comments: 3+ BLE pitting edema with small blisters, no erythema or signs of infection, no open wounds.  Skin:    General: Skin is warm and dry.     Findings: No rash.  Neurological:     General: No focal deficit present.     Mental Status: He is alert. Mental status is at baseline.  Psychiatric:        Mood and Affect: Mood normal.     No results found for any visits on 04/22/22.      Assessment & Plan:   1. Bilateral lower extremity edema/Noncompliance: Continue Bactrim for the total of 10 days. Cellulitis resolving. Does have some small blisters, leg wrapped for the patient. Discussed continuing to elevate legs and use compression. He is already on a high dose of diuretics and cannot increase further due to kidney function. We discussed that I do no write for opoid pain medications and  I will not prescribe that for him. We also discussed his general noncompliance and no-showing multiple appointments regularly. He was told that if no shows an appointment again he will no longer be able to be a patient here. Patient voices understanding.   2. Seasonal allergic rhinitis due to other allergic trigger: Recommend restarting Flonase 2 sprays on each side twice a day and switching oral anti-histamine to either Allegra or Zyrtec.   3. Episode of recurrent major depressive disorder, unspecified depression episode severity ([place]): Prozac discontinued.     Return if symptoms worsen or fail to improve.  Teodora Medici, DO

## 2022-04-23 ENCOUNTER — Inpatient Hospital Stay
Admission: EM | Admit: 2022-04-23 | Discharge: 2022-04-29 | DRG: 673 | Discharge status: Against Medical Advice | Payer: PPO | Attending: Internal Medicine | Admitting: Internal Medicine

## 2022-04-23 ENCOUNTER — Ambulatory Visit: Payer: Self-pay | Admitting: *Deleted

## 2022-04-23 ENCOUNTER — Emergency Department: Payer: PPO

## 2022-04-23 DIAGNOSIS — R400 Somnolence: Secondary | ICD-10-CM

## 2022-04-23 DIAGNOSIS — Z9889 Other specified postprocedural states: Secondary | ICD-10-CM | POA: Diagnosis not present

## 2022-04-23 DIAGNOSIS — M109 Gout, unspecified: Secondary | ICD-10-CM | POA: Diagnosis not present

## 2022-04-23 DIAGNOSIS — E1122 Type 2 diabetes mellitus with diabetic chronic kidney disease: Secondary | ICD-10-CM | POA: Diagnosis not present

## 2022-04-23 DIAGNOSIS — I132 Hypertensive heart and chronic kidney disease with heart failure and with stage 5 chronic kidney disease, or end stage renal disease: Secondary | ICD-10-CM | POA: Diagnosis present

## 2022-04-23 DIAGNOSIS — Z8249 Family history of ischemic heart disease and other diseases of the circulatory system: Secondary | ICD-10-CM

## 2022-04-23 DIAGNOSIS — K8020 Calculus of gallbladder without cholecystitis without obstruction: Secondary | ICD-10-CM | POA: Diagnosis not present

## 2022-04-23 DIAGNOSIS — I959 Hypotension, unspecified: Secondary | ICD-10-CM | POA: Diagnosis present

## 2022-04-23 DIAGNOSIS — Y92410 Unspecified street and highway as the place of occurrence of the external cause: Secondary | ICD-10-CM

## 2022-04-23 DIAGNOSIS — N186 End stage renal disease: Secondary | ICD-10-CM | POA: Diagnosis not present

## 2022-04-23 DIAGNOSIS — F439 Reaction to severe stress, unspecified: Secondary | ICD-10-CM | POA: Diagnosis present

## 2022-04-23 DIAGNOSIS — Z5329 Procedure and treatment not carried out because of patient's decision for other reasons: Secondary | ICD-10-CM | POA: Diagnosis present

## 2022-04-23 DIAGNOSIS — R4182 Altered mental status, unspecified: Secondary | ICD-10-CM | POA: Diagnosis not present

## 2022-04-23 DIAGNOSIS — J9602 Acute respiratory failure with hypercapnia: Secondary | ICD-10-CM | POA: Diagnosis not present

## 2022-04-23 DIAGNOSIS — F430 Acute stress reaction: Secondary | ICD-10-CM | POA: Diagnosis present

## 2022-04-23 DIAGNOSIS — I120 Hypertensive chronic kidney disease with stage 5 chronic kidney disease or end stage renal disease: Secondary | ICD-10-CM | POA: Diagnosis not present

## 2022-04-23 DIAGNOSIS — R0602 Shortness of breath: Secondary | ICD-10-CM | POA: Diagnosis not present

## 2022-04-23 DIAGNOSIS — G4719 Other hypersomnia: Secondary | ICD-10-CM | POA: Diagnosis not present

## 2022-04-23 DIAGNOSIS — I509 Heart failure, unspecified: Secondary | ICD-10-CM

## 2022-04-23 DIAGNOSIS — I5033 Acute on chronic diastolic (congestive) heart failure: Secondary | ICD-10-CM | POA: Diagnosis present

## 2022-04-23 DIAGNOSIS — Z91199 Patient's noncompliance with other medical treatment and regimen due to unspecified reason: Secondary | ICD-10-CM

## 2022-04-23 DIAGNOSIS — D631 Anemia in chronic kidney disease: Secondary | ICD-10-CM | POA: Diagnosis not present

## 2022-04-23 DIAGNOSIS — N179 Acute kidney failure, unspecified: Principal | ICD-10-CM | POA: Diagnosis present

## 2022-04-23 DIAGNOSIS — G894 Chronic pain syndrome: Secondary | ICD-10-CM | POA: Diagnosis present

## 2022-04-23 DIAGNOSIS — Z6841 Body Mass Index (BMI) 40.0 and over, adult: Secondary | ICD-10-CM

## 2022-04-23 DIAGNOSIS — K219 Gastro-esophageal reflux disease without esophagitis: Secondary | ICD-10-CM | POA: Diagnosis not present

## 2022-04-23 DIAGNOSIS — I429 Cardiomyopathy, unspecified: Secondary | ICD-10-CM | POA: Diagnosis present

## 2022-04-23 DIAGNOSIS — Z83511 Family history of glaucoma: Secondary | ICD-10-CM

## 2022-04-23 DIAGNOSIS — D696 Thrombocytopenia, unspecified: Secondary | ICD-10-CM | POA: Diagnosis not present

## 2022-04-23 DIAGNOSIS — N184 Chronic kidney disease, stage 4 (severe): Secondary | ICD-10-CM | POA: Diagnosis not present

## 2022-04-23 DIAGNOSIS — R0689 Other abnormalities of breathing: Secondary | ICD-10-CM | POA: Diagnosis not present

## 2022-04-23 DIAGNOSIS — Z5982 Transportation insecurity: Secondary | ICD-10-CM

## 2022-04-23 DIAGNOSIS — I129 Hypertensive chronic kidney disease with stage 1 through stage 4 chronic kidney disease, or unspecified chronic kidney disease: Secondary | ICD-10-CM | POA: Diagnosis not present

## 2022-04-23 DIAGNOSIS — T1490XA Injury, unspecified, initial encounter: Secondary | ICD-10-CM | POA: Diagnosis not present

## 2022-04-23 DIAGNOSIS — E8729 Other acidosis: Secondary | ICD-10-CM | POA: Diagnosis present

## 2022-04-23 DIAGNOSIS — Z794 Long term (current) use of insulin: Secondary | ICD-10-CM | POA: Diagnosis not present

## 2022-04-23 DIAGNOSIS — E785 Hyperlipidemia, unspecified: Secondary | ICD-10-CM | POA: Diagnosis present

## 2022-04-23 DIAGNOSIS — E662 Morbid (severe) obesity with alveolar hypoventilation: Secondary | ICD-10-CM | POA: Diagnosis not present

## 2022-04-23 DIAGNOSIS — Z91048 Other nonmedicinal substance allergy status: Secondary | ICD-10-CM

## 2022-04-23 DIAGNOSIS — N189 Chronic kidney disease, unspecified: Secondary | ICD-10-CM | POA: Diagnosis present

## 2022-04-23 DIAGNOSIS — N185 Chronic kidney disease, stage 5: Secondary | ICD-10-CM | POA: Diagnosis not present

## 2022-04-23 DIAGNOSIS — I10 Essential (primary) hypertension: Secondary | ICD-10-CM | POA: Diagnosis not present

## 2022-04-23 DIAGNOSIS — J9601 Acute respiratory failure with hypoxia: Secondary | ICD-10-CM | POA: Diagnosis not present

## 2022-04-23 DIAGNOSIS — G9341 Metabolic encephalopathy: Secondary | ICD-10-CM | POA: Diagnosis present

## 2022-04-23 DIAGNOSIS — N2581 Secondary hyperparathyroidism of renal origin: Secondary | ICD-10-CM | POA: Diagnosis present

## 2022-04-23 DIAGNOSIS — J45909 Unspecified asthma, uncomplicated: Secondary | ICD-10-CM | POA: Diagnosis present

## 2022-04-23 DIAGNOSIS — L03115 Cellulitis of right lower limb: Secondary | ICD-10-CM | POA: Diagnosis present

## 2022-04-23 DIAGNOSIS — Z992 Dependence on renal dialysis: Secondary | ICD-10-CM | POA: Diagnosis not present

## 2022-04-23 DIAGNOSIS — K5730 Diverticulosis of large intestine without perforation or abscess without bleeding: Secondary | ICD-10-CM | POA: Diagnosis not present

## 2022-04-23 DIAGNOSIS — R519 Headache, unspecified: Secondary | ICD-10-CM | POA: Diagnosis not present

## 2022-04-23 DIAGNOSIS — G47419 Narcolepsy without cataplexy: Secondary | ICD-10-CM | POA: Diagnosis not present

## 2022-04-23 DIAGNOSIS — Z833 Family history of diabetes mellitus: Secondary | ICD-10-CM

## 2022-04-23 DIAGNOSIS — Z811 Family history of alcohol abuse and dependence: Secondary | ICD-10-CM

## 2022-04-23 DIAGNOSIS — R0902 Hypoxemia: Secondary | ICD-10-CM | POA: Diagnosis not present

## 2022-04-23 DIAGNOSIS — Z5941 Food insecurity: Secondary | ICD-10-CM

## 2022-04-23 DIAGNOSIS — S199XXA Unspecified injury of neck, initial encounter: Secondary | ICD-10-CM | POA: Diagnosis not present

## 2022-04-23 DIAGNOSIS — M79673 Pain in unspecified foot: Secondary | ICD-10-CM | POA: Diagnosis not present

## 2022-04-23 DIAGNOSIS — E1129 Type 2 diabetes mellitus with other diabetic kidney complication: Secondary | ICD-10-CM | POA: Diagnosis present

## 2022-04-23 DIAGNOSIS — Z79899 Other long term (current) drug therapy: Secondary | ICD-10-CM

## 2022-04-23 DIAGNOSIS — Z720 Tobacco use: Secondary | ICD-10-CM | POA: Diagnosis present

## 2022-04-23 DIAGNOSIS — R410 Disorientation, unspecified: Secondary | ICD-10-CM | POA: Diagnosis not present

## 2022-04-23 DIAGNOSIS — G9340 Encephalopathy, unspecified: Secondary | ICD-10-CM | POA: Diagnosis not present

## 2022-04-23 LAB — BLOOD GAS, VENOUS
Acid-Base Excess: 0.5 mmol/L (ref 0.0–2.0)
Acid-Base Excess: 0.5 mmol/L (ref 0.0–2.0)
Bicarbonate: 31.4 mmol/L — ABNORMAL HIGH (ref 20.0–28.0)
Bicarbonate: 30.3 mmol/L — ABNORMAL HIGH (ref 20.0–28.0)
O2 Saturation: 59.1 %
O2 Saturation: 96 %
Patient temperature: 37
Patient temperature: 37
pCO2, Ven: 84 mmHg (ref 44–60)
pCO2, Ven: 74 mmHg (ref 44–60)
pH, Ven: 7.18 — CL (ref 7.25–7.43)
pH, Ven: 7.22 — ABNORMAL LOW (ref 7.25–7.43)
pO2, Ven: 37 mmHg (ref 32–45)
pO2, Ven: 78 mmHg — ABNORMAL HIGH (ref 32–45)

## 2022-04-23 LAB — TYPE AND SCREEN
ABO/RH(D): A POS
Antibody Screen: NEGATIVE

## 2022-04-23 LAB — CBC WITH DIFFERENTIAL/PLATELET
Abs Immature Granulocytes: 0.06 10*3/uL (ref 0.00–0.07)
Basophils Absolute: 0 10*3/uL (ref 0.0–0.1)
Basophils Relative: 0 %
Eosinophils Absolute: 0 10*3/uL (ref 0.0–0.5)
Eosinophils Relative: 1 %
HCT: 44.3 % (ref 39.0–52.0)
Hemoglobin: 12.6 g/dL — ABNORMAL LOW (ref 13.0–17.0)
Immature Granulocytes: 2 %
Lymphocytes Relative: 21 %
Lymphs Abs: 0.7 10*3/uL (ref 0.7–4.0)
MCH: 29 pg (ref 26.0–34.0)
MCHC: 28.4 g/dL — ABNORMAL LOW (ref 30.0–36.0)
MCV: 102.1 fL — ABNORMAL HIGH (ref 80.0–100.0)
Monocytes Absolute: 0.3 10*3/uL (ref 0.1–1.0)
Monocytes Relative: 9 %
Neutro Abs: 2.3 10*3/uL (ref 1.7–7.7)
Neutrophils Relative %: 67 %
Platelets: 130 10*3/uL — ABNORMAL LOW (ref 150–400)
RBC: 4.34 MIL/uL (ref 4.22–5.81)
RDW: 18.6 % — ABNORMAL HIGH (ref 11.5–15.5)
Smear Review: NORMAL
WBC: 3.5 10*3/uL — ABNORMAL LOW (ref 4.0–10.5)
nRBC: 5.2 % — ABNORMAL HIGH (ref 0.0–0.2)

## 2022-04-23 LAB — COMPREHENSIVE METABOLIC PANEL
ALT: 21 U/L (ref 0–44)
AST: 26 U/L (ref 15–41)
Albumin: 3 g/dL — ABNORMAL LOW (ref 3.5–5.0)
Alkaline Phosphatase: 155 U/L — ABNORMAL HIGH (ref 38–126)
Anion gap: 12 (ref 5–15)
BUN: 104 mg/dL — ABNORMAL HIGH (ref 6–20)
CO2: 26 mmol/L (ref 22–32)
Calcium: 8.4 mg/dL — ABNORMAL LOW (ref 8.9–10.3)
Chloride: 98 mmol/L (ref 98–111)
Creatinine, Ser: 4.91 mg/dL — ABNORMAL HIGH (ref 0.61–1.24)
GFR, Estimated: 13 mL/min — ABNORMAL LOW (ref >60)
Glucose, Bld: 143 mg/dL — ABNORMAL HIGH (ref 70–99)
Potassium: 4.7 mmol/L (ref 3.5–5.1)
Sodium: 136 mmol/L (ref 135–145)
Total Bilirubin: 1.1 mg/dL (ref 0.3–1.2)
Total Protein: 7 g/dL (ref 6.5–8.1)

## 2022-04-23 LAB — URINE DRUG SCREEN, QUALITATIVE (ARMC ONLY)
Amphetamines, Ur Screen: NOT DETECTED
Barbiturates, Ur Screen: NOT DETECTED
Benzodiazepine, Ur Scrn: NOT DETECTED
Cannabinoid 50 Ng, Ur ~~LOC~~: NOT DETECTED
Cocaine Metabolite,Ur ~~LOC~~: NOT DETECTED
MDMA (Ecstasy)Ur Screen: NOT DETECTED
Methadone Scn, Ur: NOT DETECTED
Opiate, Ur Screen: NOT DETECTED
Phencyclidine (PCP) Ur S: NOT DETECTED
Tricyclic, Ur Screen: NOT DETECTED

## 2022-04-23 LAB — GLUCOSE, CAPILLARY: Glucose-Capillary: 99 mg/dL (ref 70–99)

## 2022-04-23 LAB — TSH: TSH: 1.041 u[IU]/mL (ref 0.350–4.500)

## 2022-04-23 LAB — BRAIN NATRIURETIC PEPTIDE: B Natriuretic Peptide: 1785.1 pg/mL — ABNORMAL HIGH (ref 0.0–100.0)

## 2022-04-23 MED ORDER — FENTANYL 2500MCG IN NS 250ML (10MCG/ML) PREMIX INFUSION
INTRAVENOUS | Status: AC
  Filled 2022-04-23: qty 250

## 2022-04-23 MED ORDER — PROPOFOL 1000 MG/100ML IV EMUL
INTRAVENOUS | Status: AC
  Filled 2022-04-23: qty 100

## 2022-04-23 MED ORDER — ACETAMINOPHEN 325 MG PO TABS
650.0000 mg | ORAL_TABLET | Freq: Four times a day (QID) | ORAL | Status: DC | PRN
  Administered 2022-04-24 – 2022-04-25 (×3): 650 mg via ORAL
  Filled 2022-04-23 (×2): qty 2

## 2022-04-23 MED ORDER — INSULIN ASPART 100 UNIT/ML IJ SOLN: 0.0000 [IU] | Freq: Three times a day (TID) | INTRAMUSCULAR | Status: DC

## 2022-04-23 MED ORDER — ALBUTEROL SULFATE (2.5 MG/3ML) 0.083% IN NEBU: 2.5000 mg | INHALATION_SOLUTION | Freq: Four times a day (QID) | RESPIRATORY_TRACT | Status: DC | PRN

## 2022-04-23 MED ORDER — HEPARIN SODIUM (PORCINE) 5000 UNIT/ML IJ SOLN
5000.0000 [IU] | Freq: Two times a day (BID) | INTRAMUSCULAR | Status: AC
  Administered 2022-04-23 – 2022-04-25 (×3): 5000 [IU] via SUBCUTANEOUS
  Filled 2022-04-23 (×3): qty 1

## 2022-04-23 MED ORDER — SODIUM CHLORIDE 0.9% FLUSH
3.0000 mL | Freq: Two times a day (BID) | INTRAVENOUS | Status: DC
  Administered 2022-04-23 – 2022-04-28 (×10): 3 mL via INTRAVENOUS

## 2022-04-23 MED ORDER — FUROSEMIDE 10 MG/ML IJ SOLN
5.0000 mg/h | INTRAVENOUS | Status: DC
  Administered 2022-04-23: 5 mg/h via INTRAVENOUS
  Filled 2022-04-23: qty 20

## 2022-04-23 MED ORDER — ACETAMINOPHEN 650 MG RE SUPP: 650.0000 mg | Freq: Four times a day (QID) | RECTAL | Status: DC | PRN

## 2022-04-23 MED ORDER — FUROSEMIDE 10 MG/ML IJ SOLN
40.0000 mg | Freq: Once | INTRAMUSCULAR | Status: AC
  Administered 2022-04-23: 40 mg via INTRAVENOUS
  Filled 2022-04-23: qty 4

## 2022-04-23 MED ORDER — SODIUM CHLORIDE 0.9 % IV SOLN
1.0000 g | INTRAVENOUS | Status: DC
  Administered 2022-04-24 – 2022-04-27 (×4): 1 g via INTRAVENOUS
  Filled 2022-04-23: qty 10
  Filled 2022-04-23: qty 1
  Filled 2022-04-23 (×2): qty 10
  Filled 2022-04-23: qty 1

## 2022-04-23 NOTE — ED Notes (Signed)
Pt refuses to wear any vital sign monitoring equipment except for the oxygen probe which is on his R ear.  Hospitalist was in earlier talking to pt.

## 2022-04-23 NOTE — Assessment & Plan Note (Signed)
-  Nicotine patch 

## 2022-04-23 NOTE — Assessment & Plan Note (Signed)
HOLD bactrim. Give rocephin for now.  Wound consult.

## 2022-04-23 NOTE — Progress Notes (Signed)
PLACED PT ON BIPAP PER MD ORDER DUE TO HIGH CO2 LEVELS.
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
## 2022-04-23 NOTE — Assessment & Plan Note (Signed)
    Latest Ref Rng & Units 04/23/2022    4:41 PM 03/24/2022   12:07 PM 02/27/2022    9:31 AM  CBC  WBC 4.0 - 10.5 K/uL 3.5  5.7  5.3   Hemoglobin 13.0 - 17.0 g/dL 12.6  13.1  12.1   Hematocrit 39.0 - 52.0 % 44.3  45.9  40.1   Platelets 150 - 400 K/uL 130  125  157   Stable and chronic.  We will follow.

## 2022-04-23 NOTE — ED Notes (Signed)
Pt's license and glasses are in pt belonging bag with his coat

## 2022-04-23 NOTE — Telephone Encounter (Signed)
Left detailed voicemail was told just like yesterday she will not give him pain meds.  She told him yesterday and multiple other times as well as his 1st new patient appt and was told before he scheduled with [place].

## 2022-04-23 NOTE — ED Notes (Addendum)
Patient refused to get into hospital bed. RN explained her concerns about patient falling out of the chair. Patient yelled at RN stating that he is not going to fall or get into the hospital bed.

## 2022-04-23 NOTE — Telephone Encounter (Signed)
Summary: Swelling in legs, back pain   Pt called has swelling in his legs, and has back pain. Requesting to speak to a nurse  Best contact: [phone] [phone]     Chief Complaint: Pain med Symptoms: Back pain "Bad, can't get around." Frequency: Seen yesterday OV Pertinent Negatives: Patient denies  Disposition: [] ED /[] Urgent Care (no appt availability in office) / [] Appointment(In office/virtual)/ []  [place] [facility]/ [] Home Care/ [] Refused Recommended Disposition /[] [place] [facility]/ [x]  Follow-up with PCP Additional Notes: Pt seen yesterday, advised PCP would not prescribe opioids. Pt requesting "Something for this pain before I go under the counter for fentanyl." Pt difficult to hear as speaking with multiple people during encounter.  Assured NT would route to practice for PCPs review.  Reason for Disposition  Caller requesting a CONTROLLED substance prescription refill (e.g., narcotics, ADHD medicines)  Answer Assessment - Initial Assessment Questions 1. ONSET: "When did the pain begin?"      *No Answer* 2. LOCATION: "Where does it hurt?" (upper, mid or lower back)     *No Answer* 3. SEVERITY: "How bad is the pain?"  (e.g., Scale 1-10; mild, moderate, or severe)   - MILD (1-3): Doesn't interfere with normal activities.    - MODERATE (4-7): Interferes with normal activities or awakens from sleep.    - SEVERE (8-10): Excruciating pain, unable to do any normal activities.      *No Answer* 4. PATTERN: "Is the pain constant?" (e.g., yes, no; constant, intermittent)      *No Answer* 5. RADIATION: "Does the pain shoot into your legs or somewhere else?"     *No Answer* 6. CAUSE:  "What do you think is causing the back pain?"      *No Answer* 7. BACK OVERUSE:  "Any recent lifting of heavy objects, strenuous work or exercise?"     *No Answer* 8. MEDICINES: "What have you taken so far for the pain?" (e.g., nothing, acetaminophen, NSAIDS)     *No Answer* 9. NEUROLOGIC  SYMPTOMS: "Do you have any weakness, numbness, or problems with bowel/bladder control?"     *No Answer* 10. OTHER SYMPTOMS: "Do you have any other symptoms?" (e.g., fever, abdomen pain, burning with urination, blood in urine)       *No Answer* 11. PREGNANCY: "Is there any chance you are pregnant?" "When was your last menstrual period?"       *No Answer*  Answer Assessment - Initial Assessment Questions 1. DRUG NAME: "What medicine do you need to have refilled?"     "A pain med" 2. REFILLS REMAINING: "How many refills are remaining?" (Note: The label on the medicine or pill bottle will show how many refills are remaining. If there are no refills remaining, then a renewal may be needed.)     NA 3. EXPIRATION DATE: "What is the expiration date?" (Note: The label states when the prescription will expire, and thus can no longer be refilled.)     NA 4. PRESCRIBING HCP: "Who prescribed it?" Reason: If prescribed by specialist, call should be referred to that group.     NA 5. SYMPTOMS: "Do you have any symptoms?"     Back pain  Protocols used: Back Pain-A-AH, Medication Refill and Renewal Call-A-AH

## 2022-04-23 NOTE — ED Notes (Signed)
Pt states that he has to have a BM before we can triage him.

## 2022-04-23 NOTE — Assessment & Plan Note (Signed)
IV PPI. 

## 2022-04-23 NOTE — Assessment & Plan Note (Signed)
Counseling /education when pt is alert and coherent.

## 2022-04-23 NOTE — Assessment & Plan Note (Signed)
?   Narcolepsy or OSA. Suspect pt has sleep apnea and needs sleep study and weight loss.

## 2022-04-23 NOTE — Assessment & Plan Note (Signed)
2/2 hypercapnia and elevated BUN causing uremia.  Head ct when stable.

## 2022-04-23 NOTE — Assessment & Plan Note (Signed)
2/2 to bactrim.  Nephrology consult.
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
## 2022-04-23 NOTE — ED Notes (Signed)
IVC/pending medical admission ?

## 2022-04-23 NOTE — Assessment & Plan Note (Signed)
Nutritionist consult for weight loss and lifestyle management for diet and exercise.
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
## 2022-04-23 NOTE — Assessment & Plan Note (Signed)
     Latest Ref Rng & Units 04/23/2022    4:41 PM 03/24/2022   12:07 PM 02/27/2022    9:31 AM  CBC  WBC 4.0 - 10.5 K/uL 3.5  5.7  5.3   Hemoglobin 13.0 - 17.0 g/dL 12.6  13.1  12.1   Hematocrit 39.0 - 52.0 % 44.3  45.9  40.1   Platelets 150 - 400 K/uL 130  125  157   We will follow.  No bleeding or bruises or nosebleeds.

## 2022-04-23 NOTE — Assessment & Plan Note (Signed)
Patient is currently n.p.o. due to mental status. Will start patient on every 4 hourly Accu-Cheks with sliding scale coverage based on Accu-Cheks.

## 2022-04-24 ENCOUNTER — Encounter
Admission: EM | Discharge status: Against Medical Advice | Payer: Self-pay | Source: Home / Self Care | Attending: Internal Medicine

## 2022-04-24 ENCOUNTER — Other Ambulatory Visit: Payer: Self-pay

## 2022-04-24 DIAGNOSIS — E1122 Type 2 diabetes mellitus with diabetic chronic kidney disease: Secondary | ICD-10-CM

## 2022-04-24 DIAGNOSIS — K219 Gastro-esophageal reflux disease without esophagitis: Secondary | ICD-10-CM

## 2022-04-24 DIAGNOSIS — R0689 Other abnormalities of breathing: Secondary | ICD-10-CM | POA: Diagnosis present

## 2022-04-24 DIAGNOSIS — F430 Acute stress reaction: Secondary | ICD-10-CM | POA: Diagnosis present

## 2022-04-24 DIAGNOSIS — I129 Hypertensive chronic kidney disease with stage 1 through stage 4 chronic kidney disease, or unspecified chronic kidney disease: Secondary | ICD-10-CM | POA: Diagnosis not present

## 2022-04-24 DIAGNOSIS — N189 Chronic kidney disease, unspecified: Secondary | ICD-10-CM

## 2022-04-24 DIAGNOSIS — N179 Acute kidney failure, unspecified: Principal | ICD-10-CM

## 2022-04-24 DIAGNOSIS — R4182 Altered mental status, unspecified: Secondary | ICD-10-CM

## 2022-04-24 DIAGNOSIS — Z91199 Patient's noncompliance with other medical treatment and regimen due to unspecified reason: Secondary | ICD-10-CM

## 2022-04-24 HISTORY — PX: TEMPORARY DIALYSIS CATHETER: CATH118312

## 2022-04-24 LAB — CBC
HCT: 44.8 % (ref 39.0–52.0)
Hemoglobin: 12.6 g/dL — ABNORMAL LOW (ref 13.0–17.0)
MCH: 28.6 pg (ref 26.0–34.0)
MCHC: 28.1 g/dL — ABNORMAL LOW (ref 30.0–36.0)
MCV: 101.8 fL — ABNORMAL HIGH (ref 80.0–100.0)
Platelets: 148 10*3/uL — ABNORMAL LOW (ref 150–400)
RBC: 4.4 MIL/uL (ref 4.22–5.81)
RDW: 18.6 % — ABNORMAL HIGH (ref 11.5–15.5)
WBC: 3.7 10*3/uL — ABNORMAL LOW (ref 4.0–10.5)
nRBC: 3.8 % — ABNORMAL HIGH (ref 0.0–0.2)

## 2022-04-24 LAB — COMPREHENSIVE METABOLIC PANEL
ALT: 22 U/L (ref 0–44)
AST: 23 U/L (ref 15–41)
Albumin: 3 g/dL — ABNORMAL LOW (ref 3.5–5.0)
Alkaline Phosphatase: 159 U/L — ABNORMAL HIGH (ref 38–126)
Anion gap: 11 (ref 5–15)
BUN: 108 mg/dL — ABNORMAL HIGH (ref 6–20)
CO2: 28 mmol/L (ref 22–32)
Calcium: 8.8 mg/dL — ABNORMAL LOW (ref 8.9–10.3)
Chloride: 98 mmol/L (ref 98–111)
Creatinine, Ser: 4.85 mg/dL — ABNORMAL HIGH (ref 0.61–1.24)
GFR, Estimated: 13 mL/min — ABNORMAL LOW (ref >60)
Glucose, Bld: 92 mg/dL (ref 70–99)
Potassium: 4.7 mmol/L (ref 3.5–5.1)
Sodium: 137 mmol/L (ref 135–145)
Total Bilirubin: 1.2 mg/dL (ref 0.3–1.2)
Total Protein: 7.3 g/dL (ref 6.5–8.1)

## 2022-04-24 LAB — LACTIC ACID, PLASMA
Lactic Acid, Venous: 1.1 mmol/L (ref 0.5–1.9)
Lactic Acid, Venous: 1.1 mmol/L (ref 0.5–1.9)

## 2022-04-24 LAB — GLUCOSE, CAPILLARY
Glucose-Capillary: 98 mg/dL (ref 70–99)
Glucose-Capillary: 94 mg/dL (ref 70–99)
Glucose-Capillary: 88 mg/dL (ref 70–99)

## 2022-04-24 LAB — AMMONIA: Ammonia: 66 umol/L — ABNORMAL HIGH (ref 9–35)

## 2022-04-24 LAB — TSH: TSH: 0.977 u[IU]/mL (ref 0.350–4.500)

## 2022-04-24 LAB — CK: Total CK: 52 U/L (ref 49–397)

## 2022-04-24 SURGERY — TEMPORARY DIALYSIS CATHETER
Anesthesia: Moderate Sedation

## 2022-04-24 MED ORDER — CHLORHEXIDINE GLUCONATE CLOTH 2 % EX PADS
6.0000 | MEDICATED_PAD | Freq: Every day | CUTANEOUS | Status: DC
  Administered 2022-04-25 – 2022-04-29 (×5): 6 via TOPICAL

## 2022-04-24 MED ORDER — MIDAZOLAM HCL 2 MG/ML PO SYRP
8.0000 mg | ORAL_SOLUTION | ORAL | Status: AC
  Filled 2022-04-24: qty 5

## 2022-04-24 MED ORDER — OXYCODONE HCL 5 MG PO TABS
5.0000 mg | ORAL_TABLET | ORAL | Status: DC | PRN
  Administered 2022-04-25 – 2022-04-27 (×4): 5 mg via ORAL
  Filled 2022-04-24 (×3): qty 1

## 2022-04-24 MED ORDER — TRAZODONE HCL 50 MG PO TABS: 50.0000 mg | ORAL_TABLET | Freq: Every evening | ORAL | Status: DC | PRN

## 2022-04-24 MED ORDER — OLANZAPINE 10 MG IM SOLR
5.0000 mg | Freq: Four times a day (QID) | INTRAMUSCULAR | Status: DC | PRN
  Filled 2022-04-24: qty 10

## 2022-04-24 MED ORDER — ONDANSETRON HCL 4 MG/2ML IJ SOLN
4.0000 mg | Freq: Four times a day (QID) | INTRAMUSCULAR | Status: DC | PRN
  Filled 2022-04-24: qty 2

## 2022-04-24 MED ORDER — VITAMIN B-12 1000 MCG PO TABS
1000.0000 ug | ORAL_TABLET | Freq: Every day | ORAL | Status: DC
  Administered 2022-04-25 – 2022-04-29 (×5): 1000 ug via ORAL
  Filled 2022-04-24 (×5): qty 1

## 2022-04-24 MED ORDER — HEPARIN SODIUM (PORCINE) 1000 UNIT/ML IJ SOLN
INTRAMUSCULAR | Status: AC
  Filled 2022-04-24: qty 1

## 2022-04-24 MED ORDER — MIDAZOLAM HCL 2 MG/ML PO SYRP
ORAL_SOLUTION | ORAL | Status: AC
  Administered 2022-04-24: 8 mg via ORAL
  Filled 2022-04-24: qty 5

## 2022-04-24 MED ORDER — MIDAZOLAM HCL 2 MG/ML PO SYRP: 8.0000 mg | ORAL_SOLUTION | Freq: Once | ORAL | Status: DC

## 2022-04-24 MED ORDER — CARVEDILOL 25 MG PO TABS
50.0000 mg | ORAL_TABLET | Freq: Two times a day (BID) | ORAL | Status: DC
  Administered 2022-04-25 – 2022-04-29 (×7): 50 mg via ORAL
  Filled 2022-04-24 (×8): qty 2

## 2022-04-24 MED ORDER — LACTULOSE 10 GM/15ML PO SOLN
20.0000 g | Freq: Two times a day (BID) | ORAL | Status: DC
  Administered 2022-04-24 – 2022-04-26 (×5): 20 g via ORAL
  Filled 2022-04-24 (×9): qty 30

## 2022-04-24 MED ORDER — SIMETHICONE 80 MG PO CHEW: 80.0000 mg | CHEWABLE_TABLET | Freq: Four times a day (QID) | ORAL | Status: DC | PRN

## 2022-04-24 MED ORDER — DEXTROSE-NACL 5-0.45 % IV SOLN: INTRAVENOUS | Status: DC

## 2022-04-24 MED ORDER — ATORVASTATIN CALCIUM 20 MG PO TABS
20.0000 mg | ORAL_TABLET | Freq: Every day | ORAL | Status: DC
  Administered 2022-04-25 – 2022-04-29 (×5): 20 mg via ORAL
  Filled 2022-04-24 (×5): qty 1

## 2022-04-24 MED ORDER — CALCITRIOL 0.25 MCG PO CAPS
0.5000 ug | ORAL_CAPSULE | Freq: Every day | ORAL | Status: DC
  Administered 2022-04-25 – 2022-04-27 (×3): 0.5 ug via ORAL
  Filled 2022-04-24 (×6): qty 2

## 2022-04-24 MED ORDER — METOPROLOL TARTRATE 5 MG/5ML IV SOLN: 5.0000 mg | INTRAVENOUS | Status: DC | PRN

## 2022-04-24 MED ORDER — GUAIFENESIN 100 MG/5ML PO LIQD
5.0000 mL | ORAL | Status: DC | PRN
  Administered 2022-04-26: 5 mL via ORAL
  Filled 2022-04-24: qty 10

## 2022-04-24 MED ORDER — RISPERIDONE 0.25 MG PO TABS
0.2500 mg | ORAL_TABLET | Freq: Two times a day (BID) | ORAL | Status: DC | PRN
  Administered 2022-04-26 (×2): 0.25 mg via ORAL
  Filled 2022-04-24 (×5): qty 1

## 2022-04-24 MED ORDER — PANTOPRAZOLE SODIUM 40 MG PO TBEC
40.0000 mg | DELAYED_RELEASE_TABLET | Freq: Every day | ORAL | Status: DC
  Administered 2022-04-25 – 2022-04-29 (×4): 40 mg via ORAL
  Filled 2022-04-24 (×5): qty 1

## 2022-04-24 MED ORDER — IPRATROPIUM-ALBUTEROL 0.5-2.5 (3) MG/3ML IN SOLN: 3.0000 mL | RESPIRATORY_TRACT | Status: DC | PRN

## 2022-04-24 MED ORDER — SENNOSIDES-DOCUSATE SODIUM 8.6-50 MG PO TABS: 1.0000 | ORAL_TABLET | Freq: Every evening | ORAL | Status: DC | PRN

## 2022-04-24 MED ORDER — HYDRALAZINE HCL 20 MG/ML IJ SOLN: 10.0000 mg | INTRAMUSCULAR | Status: DC | PRN

## 2022-04-24 SURGICAL SUPPLY — 9 items
BIOPATCH RED 1 DISK 7.0 (GAUZE/BANDAGES/DRESSINGS) IMPLANT
COVER PROBE ULTRASOUND 5X96 (MISCELLANEOUS) IMPLANT
GOWN STRL REUS W/ TWL LRG LVL3 (GOWN DISPOSABLE) ×1 IMPLANT
GOWN STRL REUS W/TWL LRG LVL3 (GOWN DISPOSABLE) ×1
KIT CATH DIALYSIS TRI 20X13 (CATHETERS) IMPLANT
NDL ENTRY 21GA 7CM ECHOTIP (NEEDLE) IMPLANT
NEEDLE ENTRY 21GA 7CM ECHOTIP (NEEDLE) ×1 IMPLANT
PACK ANGIOGRAPHY (CUSTOM PROCEDURE TRAY) ×1 IMPLANT
SET INTRO CAPELLA COAXIAL (SET/KITS/TRAYS/PACK) IMPLANT
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
## 2022-04-24 NOTE — Progress Notes (Signed)
Chap responded to a spiritual Consult. Pt and daughter in the room, Pt says he requested for Chaplain since last night and he is not going to talk before the nurse comes. Chap checked for the nurse who had stepped out for lunch break. Melven Sartorius will follow up with the nurse.   04/24/22 1400  Spiritual Encounters  Type of Visit Initial  Care provided to: Patient  Conversation partners present during encounter Nurse  Referral source Patient request  Reason for visit Routine spiritual support  OnCall Visit Yes  Spiritual Framework  Presenting Themes Significant life change;Impactful experiences and emotions  Community/Connection Family  Patient Stress Factors Lack of knowledge  Interventions  Spiritual Care Interventions Made Established relationship of care and support;Mindfulness intervention  Intervention Outcomes  Outcomes Connection to spiritual care;Awareness of support  Spiritual Care Plan  Spiritual Care Issues Still Outstanding Chaplain will continue to follow

## 2022-04-24 NOTE — Progress Notes (Signed)
Patient refusing to wear his telemetry at this time. Provider aware

## 2022-04-24 NOTE — Progress Notes (Signed)
MD ordered bladder scan. Patient refusing to lay down in bed to complete scan. Staff is unable to complete bladder scan with patient in chair due to pannus. Provider Reesa Chew made aware.

## 2022-04-24 NOTE — Consult Note (Addendum)
[facility] Face-to-Face Psychiatry Consult   Reason for Consult:  IVC'd by the EDP as he felt he did not need medical care Referring Physician:  Dr Reesa Chew Patient Identification: [name] MRN:  [id number] Principal Diagnosis: AMS (altered mental status) Diagnosis:  Principal Problem:   AMS (altered mental status) Active Problems:   Stress reaction causing mixed disturbance of emotion and conduct   Morbid obesity with BMI of 50.0-59.9, adult ([place])   Type 2 diabetes mellitus, controlled, with renal complications (HCC)   Tobacco abuse, episodic   Noncompliance   Excessive daytime sleepiness   GERD (gastroesophageal reflux disease)   Acute kidney injury superimposed on CKD (HCC)   Thrombocytopenia (HCC)   Anemia in stage 4 chronic kidney disease (HCC)   Congestive heart failure (HCC)   Cellulitis of right lower extremity   Hypercapnia   Total Time spent with patient: 45 minutes  Subjective:   [name] is a 57 y.o. male patient admitted with medical concerns, IVC'd to receive care, "I want a chaplain and food.  I have the right to walk out of here."  HPI:  57 yo male presented to the ED after an accident with low O2 and AMS.  On assessment, he is irritable and wants food.  Explained his NPO status.  Then, he demanded to see a chaplain, politely let him know a consult would be placed.  He denies suicidal/homicidal ideations, hallucinations, and substance abuse.  Empathized about his frustration with a small effect.  IVC'd will be upheld until he is medically cleared for his safety.  Past Psychiatric History: none  Risk to Self:  none Risk to Others:  none Prior Inpatient Therapy:  none Prior Outpatient Therapy:  none  Past Medical History:  Past Medical History:  Diagnosis Date   Allergy    Asthma    Diabetes mellitus without complication ([place])    Gout    Hyperlipidemia    Hypertension    Morbid obesity ([place])     Past Surgical History:  Procedure Laterality Date    TEMPORARY DIALYSIS CATHETER Right 11/14/2021   Procedure: TEMPORARY DIALYSIS CATHETER;  Surgeon: Algernon Huxley, MD;  Location: [place] [facility];  Service: Cardiovascular;  Laterality: Right;   Family History:  Family History  Problem Relation Age of Onset   Diabetes Mother    Hypertension Mother    Alcohol abuse Father    Glaucoma Father    Diabetes Father    Hypertension Father    Cancer Neg Hx    COPD Neg Hx    Heart disease Neg Hx    Stroke Neg Hx    Family Psychiatric  History: see above Social History:  Social History   Substance and Sexual Activity  Alcohol Use Yes   Comment: a glass of wine and a couple beers a day     Social History   Substance and Sexual Activity  Drug Use No    Social History   Socioeconomic History   Marital status: Divorced    Spouse name: Not on file   Number of children: Not on file   Years of education: Not on file   Highest education level: Not on file  Occupational History   Not on file  Tobacco Use   Smoking status: Never   Smokeless tobacco: Never  Vaping Use   Vaping Use: Never used  Substance and Sexual Activity   Alcohol use: Yes    Comment: a glass of wine and a  couple beers a day   Drug use: No   Sexual activity: Not Currently  Other Topics Concern   Not on file  Social History Narrative   Not on file   Social Determinants of Health   Financial Resource Strain: Not on file  Food Insecurity: No Food Insecurity (04/24/2022)   Hunger Vital Sign    Worried About Running Out of Food in the Last Year: Never true    Ran Out of Food in the Last Year: Never true  Recent Concern: Food Insecurity - Food Insecurity Present (04/17/2022)   Hunger Vital Sign    Worried About Running Out of Food in the Last Year: Often true    Ran Out of Food in the Last Year: Often true  Transportation Needs: No Transportation Needs (04/24/2022)   PRAPARE - Hydrologist (Medical): No    Lack of Transportation  (Non-Medical): No  Physical Activity: Not on file  Stress: Not on file  Social Connections: Not on file   Additional Social History:    Allergies:   Allergies  Allergen Reactions   Talc     Labs:  Results for orders placed or performed during the hospital encounter of 04/23/22 (from the past 48 hour(s))  CBC with Differential     Status: Abnormal   Collection Time: 04/23/22  4:41 PM  Result Value Ref Range   WBC 3.5 (L) 4.0 - 10.5 K/uL   RBC 4.34 4.22 - 5.81 MIL/uL   Hemoglobin 12.6 (L) 13.0 - 17.0 g/dL   HCT 44.3 39.0 - 52.0 %   MCV 102.1 (H) 80.0 - 100.0 fL   MCH 29.0 26.0 - 34.0 pg   MCHC 28.4 (L) 30.0 - 36.0 g/dL   RDW 18.6 (H) 11.5 - 15.5 %   Platelets 130 (L) 150 - 400 K/uL   nRBC 5.2 (H) 0.0 - 0.2 %   Neutrophils Relative % 67 %   Neutro Abs 2.3 1.7 - 7.7 K/uL   Lymphocytes Relative 21 %   Lymphs Abs 0.7 0.7 - 4.0 K/uL   Monocytes Relative 9 %   Monocytes Absolute 0.3 0.1 - 1.0 K/uL   Eosinophils Relative 1 %   Eosinophils Absolute 0.0 0.0 - 0.5 K/uL   Basophils Relative 0 %   Basophils Absolute 0.0 0.0 - 0.1 K/uL   RBC Morphology MIXED RBC MORPHOLOGY    Smear Review Normal platelet morphology    Immature Granulocytes 2 %   Abs Immature Granulocytes 0.06 0.00 - 0.07 K/uL   Reactive, Benign Lymphocytes PRESENT     Comment: Performed at [facility], [address] [place], [place] [address]  Comprehensive metabolic panel     Status: Abnormal   Collection Time: 04/23/22  4:41 PM  Result Value Ref Range   Sodium 136 135 - 145 mmol/L   Potassium 4.7 3.5 - 5.1 mmol/L   Chloride 98 98 - 111 mmol/L   CO2 26 22 - 32 mmol/L   Glucose, Bld 143 (H) 70 - 99 mg/dL    Comment: Glucose reference range applies only to samples taken after fasting for at least 8 hours.   BUN 104 (H) 6 - 20 mg/dL    Comment: RESULT CONFIRMED BY MANUAL DILUTION SS   Creatinine, Ser 4.91 (H) 0.61 - 1.24 mg/dL   Calcium 8.4 (L) 8.9 - 10.3 mg/dL   Total Protein 7.0 6.5 - 8.1 g/dL    Albumin 3.0 (L) 3.5 - 5.0 g/dL  AST 26 15 - 41 U/L   ALT 21 0 - 44 U/L   Alkaline Phosphatase 155 (H) 38 - 126 U/L   Total Bilirubin 1.1 0.3 - 1.2 mg/dL   GFR, Estimated 13 (L) >60 mL/min    Comment: (NOTE) Calculated using the CKD-EPI Creatinine Equation (2021)    Anion gap 12 5 - 15    Comment: Performed at [facility], [address] [place], [place] [address]  Type and screen [place]     Status: None   Collection Time: 04/23/22  4:41 PM  Result Value Ref Range   ABO/RH(D) A POS    Antibody Screen NEG    Sample Expiration      04/26/2022,2359 Performed at [facility], [address] [place], [place] [address]   Brain natriuretic peptide     Status: Abnormal   Collection Time: 04/23/22  4:41 PM  Result Value Ref Range   B Natriuretic Peptide 1,785.1 (H) 0.0 - 100.0 pg/mL    Comment: Performed at [facility], [address] [place], [place] [address]  TSH     Status: None   Collection Time: 04/23/22  4:41 PM  Result Value Ref Range   TSH 1.041 0.350 - 4.500 uIU/mL    Comment: Performed by a 3rd Generation assay with a functional sensitivity of <=0.01 uIU/mL. Performed at [facility], [address] [place], [place] [address]   Blood gas, venous     Status: Abnormal   Collection Time: 04/23/22  6:40 PM  Result Value Ref Range   pH, Ven 7.18 (LL) 7.25 - 7.43    Comment: CRITICAL RESULT CALLED TO, READ BACK BY AND VERIFIED WITH: PATRICK ROBINSON,MD AT 1912 ON [id number]  RGM,RRT    pCO2, Ven 84 (HH) 44 - 60 mmHg    Comment: CRITICAL RESULT CALLED TO, READ BACK BY AND VERIFIED WITH: PATRICK ROBINSON,MD AT 1912 ON [id number] RGM,RRT    pO2, Ven 37 32 - 45 mmHg   Bicarbonate 31.4 (H) 20.0 - 28.0 mmol/L   Acid-Base Excess 0.5 0.0 - 2.0 mmol/L   O2 Saturation 59.1 %   Patient temperature 37.0     Comment: Performed at [facility], [address], [place], [place] [address]  Urine Drug  Screen, Qualitative ([facility] only)     Status: None   Collection Time: 04/23/22  9:08 PM  Result Value Ref Range   Tricyclic, Ur Screen NONE DETECTED NONE DETECTED   Amphetamines, Ur Screen NONE DETECTED NONE DETECTED   MDMA (Ecstasy)Ur Screen NONE DETECTED NONE DETECTED   Cocaine Metabolite,Ur [place] NONE DETECTED NONE DETECTED   Opiate, Ur Screen NONE DETECTED NONE DETECTED   Phencyclidine (PCP) Ur S NONE DETECTED NONE DETECTED   Cannabinoid 50 Ng, Ur [place] NONE DETECTED NONE DETECTED   Barbiturates, Ur Screen NONE DETECTED NONE DETECTED   Benzodiazepine, Ur Scrn NONE DETECTED NONE DETECTED   Methadone Scn, Ur NONE DETECTED NONE DETECTED    Comment: (NOTE) Tricyclics + metabolites, urine    Cutoff 1000 ng/mL Amphetamines + metabolites, urine  Cutoff 1000 ng/mL MDMA (Ecstasy), urine              Cutoff 500 ng/mL Cocaine Metabolite, urine          Cutoff 300 ng/mL Opiate + metabolites, urine        Cutoff 300 ng/mL Phencyclidine (PCP), urine         Cutoff 25 ng/mL Cannabinoid, urine  Cutoff 50 ng/mL Barbiturates + metabolites, urine  Cutoff 200 ng/mL Benzodiazepine, urine              Cutoff 200 ng/mL Methadone, urine                   Cutoff 300 ng/mL  The urine drug screen provides only a preliminary, unconfirmed analytical test result and should not be used for non-medical purposes. Clinical consideration and professional judgment should be applied to any positive drug screen result due to possible interfering substances. A more specific alternate chemical method must be used in order to obtain a confirmed analytical result. Gas chromatography / mass spectrometry (GC/MS) is the preferred confirm atory method. Performed at [facility], [address] [place], [place] [address]   Glucose, capillary     Status: None   Collection Time: 04/23/22 10:52 PM  Result Value Ref Range   Glucose-Capillary 99 70 - 99 mg/dL    Comment: Glucose reference range applies  only to samples taken after fasting for at least 8 hours.  Blood gas, venous     Status: Abnormal   Collection Time: 04/23/22 11:27 PM  Result Value Ref Range   pH, Ven 7.22 (L) 7.25 - 7.43   pCO2, Ven 74 (HH) 44 - 60 mmHg    Comment: CRITICAL RESULT CALLED TO, READ BACK BY AND VERIFIED WITH: ZACK BAKER AT 2343 ON 04/23/22 KSL    pO2, Ven 78 (H) 32 - 45 mmHg   Bicarbonate 30.3 (H) 20.0 - 28.0 mmol/L   Acid-Base Excess 0.5 0.0 - 2.0 mmol/L   O2 Saturation 96 %   Patient temperature 37.0    Collection site VEIN     Comment: Performed at [facility], [address] [place], [place] [address]  Comprehensive metabolic panel     Status: Abnormal   Collection Time: 04/24/22  4:20 AM  Result Value Ref Range   Sodium 137 135 - 145 mmol/L   Potassium 4.7 3.5 - 5.1 mmol/L   Chloride 98 98 - 111 mmol/L   CO2 28 22 - 32 mmol/L   Glucose, Bld 92 70 - 99 mg/dL    Comment: Glucose reference range applies only to samples taken after fasting for at least 8 hours.   BUN 108 (H) 6 - 20 mg/dL    Comment: RESULT CONFIRMED BY MANUAL DILUTION CORRECTED ON 03/29 AT 0534: PREVIOUSLY REPORTED AS [id number] RESULT CONFIRMED BY MANUAL DILUTION    Creatinine, Ser 4.85 (H) 0.61 - 1.24 mg/dL   Calcium 8.8 (L) 8.9 - 10.3 mg/dL   Total Protein 7.3 6.5 - 8.1 g/dL   Albumin 3.0 (L) 3.5 - 5.0 g/dL   AST 23 15 - 41 U/L   ALT 22 0 - 44 U/L   Alkaline Phosphatase 159 (H) 38 - 126 U/L   Total Bilirubin 1.2 0.3 - 1.2 mg/dL   GFR, Estimated 13 (L) >60 mL/min    Comment: (NOTE) Calculated using the CKD-EPI Creatinine Equation (2021)    Anion gap 11 5 - 15    Comment: Performed at [facility], [address] [place], [place] [address]  CBC     Status: Abnormal   Collection Time: 04/24/22  4:20 AM  Result Value Ref Range   WBC 3.7 (L) 4.0 - 10.5 K/uL   RBC 4.40 4.22 - 5.81 MIL/uL   Hemoglobin 12.6 (L) 13.0 - 17.0 g/dL   HCT 44.8 39.0 - 52.0 %   MCV 101.8 (  H) 80.0 - 100.0 fL   MCH 28.6 26.0 -  34.0 pg   MCHC 28.1 (L) 30.0 - 36.0 g/dL   RDW 18.6 (H) 11.5 - 15.5 %   Platelets 148 (L) 150 - 400 K/uL   nRBC 3.8 (H) 0.0 - 0.2 %    Comment: Performed at [facility], [address] [place], [place] [address]  Glucose, capillary     Status: None   Collection Time: 04/24/22  5:53 AM  Result Value Ref Range   Glucose-Capillary 98 70 - 99 mg/dL    Comment: Glucose reference range applies only to samples taken after fasting for at least 8 hours.  Glucose, capillary     Status: None   Collection Time: 04/24/22  8:50 AM  Result Value Ref Range   Glucose-Capillary 94 70 - 99 mg/dL    Comment: Glucose reference range applies only to samples taken after fasting for at least 8 hours.  CK     Status: None   Collection Time: 04/24/22  9:04 AM  Result Value Ref Range   Total CK 52 49 - 397 U/L    Comment: Performed at [facility], [address] [place], [place] [address]  Ammonia     Status: Abnormal   Collection Time: 04/24/22  9:04 AM  Result Value Ref Range   Ammonia 66 (H) 9 - 35 umol/L    Comment: Performed at [facility], [address] [place], [place] [address]  Lactic acid, plasma     Status: None   Collection Time: 04/24/22  9:04 AM  Result Value Ref Range   Lactic Acid, Venous 1.1 0.5 - 1.9 mmol/L    Comment: Performed at [facility], [address] [place], [place] [address]  TSH     Status: None   Collection Time: 04/24/22  9:04 AM  Result Value Ref Range   TSH 0.977 0.350 - 4.500 uIU/mL    Comment: Performed by a 3rd Generation assay with a functional sensitivity of <=0.01 uIU/mL. Performed at [facility], [address] [place], [place] [address]     Current Facility-Administered Medications  Medication Dose Route Frequency Provider Last Rate Last Admin   acetaminophen (TYLENOL) tablet 650 mg  650 mg Oral Q6H PRN Para Skeans, MD   650 mg at 04/24/22 [id number]   Or   acetaminophen (TYLENOL) suppository 650 mg   650 mg Rectal Q6H PRN Para Skeans, MD       atorvastatin (LIPITOR) tablet 20 mg  20 mg Oral Daily Amin, Ankit Chirag, MD       calcitRIOL (ROCALTROL) capsule 0.5 mcg  0.5 mcg Oral Daily Amin, Ankit Chirag, MD       carvedilol (COREG) tablet 50 mg  50 mg Oral BID WC Amin, Ankit Chirag, MD       cefTRIAXone (ROCEPHIN) 1 g in sodium chloride 0.9 % 100 mL IVPB  1 g Intravenous Q24H Para Skeans, MD   Stopped at 04/24/22 0044   cyanocobalamin (VITAMIN B12) tablet 1,000 mcg  1,000 mcg Oral Daily Amin, Ankit Chirag, MD       dextrose 5 %-0.45 % sodium chloride infusion   Intravenous Continuous Amin, Ankit Chirag, MD       furosemide (LASIX) 200 mg in dextrose 5 % 100 mL (2 mg/mL) infusion  5 mg/hr Intravenous Continuous Florina Ou V, MD 2.5 mL/hr at 04/23/22 2100 5 mg/hr at 04/23/22 2100   guaiFENesin (ROBITUSSIN) 100  MG/5ML liquid 5 mL  5 mL Oral Q4H PRN Amin, Jeanella Flattery, MD       heparin injection 5,000 Units  5,000 Units Subcutaneous Q12H Para Skeans, MD   5,000 Units at 04/23/22 2242   hydrALAZINE (APRESOLINE) injection 10 mg  10 mg Intravenous Q4H PRN Amin, Ankit Chirag, MD       insulin aspart (novoLOG) injection 0-6 Units  0-6 Units Subcutaneous TID WC Patel, Ekta V, MD       ipratropium-albuterol (DUONEB) 0.5-2.5 (3) MG/3ML nebulizer solution 3 mL  3 mL Nebulization Q4H PRN Amin, Ankit Chirag, MD       metoprolol tartrate (LOPRESSOR) injection 5 mg  5 mg Intravenous Q4H PRN Amin, Ankit Chirag, MD       OLANZapine (ZYPREXA) injection 5 mg  5 mg Intramuscular Q6H PRN Amin, Ankit Chirag, MD       ondansetron (ZOFRAN) injection 4 mg  4 mg Intravenous Q6H PRN Amin, Ankit Chirag, MD       oxyCODONE (Oxy IR/ROXICODONE) immediate release tablet 5 mg  5 mg Oral Q4H PRN Amin, Ankit Chirag, MD       pantoprazole (PROTONIX) EC tablet 40 mg  40 mg Oral Daily Amin, Ankit Chirag, MD       risperiDONE (RISPERDAL) tablet 0.25 mg  0.25 mg Oral BID PRN Patrecia Pour, NP       senna-docusate (Senokot-S)  tablet 1 tablet  1 tablet Oral QHS PRN Amin, Jeanella Flattery, MD       simethicone (MYLICON) chewable tablet 80 mg  80 mg Oral QID PRN Amin, Ankit Chirag, MD       sodium chloride flush (NS) 0.9 % injection 3 mL  3 mL Intravenous Q12H Para Skeans, MD   3 mL at 04/24/22 0901    Musculoskeletal: Strength & Muscle Tone: within normal limits Gait & Station:  did not witness Patient leans:  forward to assist breathing  Psychiatric Specialty Exam: Physical Exam Vitals and nursing note reviewed.  Constitutional:      Appearance: He is obese.  HENT:     Head: Normocephalic.     Nose: Nose normal.  Musculoskeletal:     Cervical back: Normal range of motion.  Neurological:     General: No focal deficit present.     Mental Status: He is alert and oriented to person, place, and time.  Psychiatric:        Attention and Perception: Attention and perception normal.        Mood and Affect: Mood is anxious.        Speech: Speech normal.        Behavior: Behavior normal. Behavior is cooperative.        Thought Content: Thought content normal.        Cognition and Memory: Cognition and memory normal.        Judgment: Judgment normal.     Review of Systems  Musculoskeletal:  Positive for myalgias.  Psychiatric/Behavioral:  The patient is nervous/anxious.   All other systems reviewed and are negative.   Blood pressure 117/74, pulse 72, temperature 97.6 F (36.4 C), resp. rate 16, SpO2 98 %.There is no height or weight on file to calculate BMI.  General Appearance: Casual  Eye Contact:  Good  Speech:  Normal Rate  Volume:  Normal  Mood:  Anxious and Irritable  Affect:  Congruent  Thought Process:  Coherent  Orientation:  Full (Time, Place, and Person)  Thought Content:  WDL and Logical  Suicidal Thoughts:  No  Homicidal Thoughts:  No  Memory:  Immediate;   Good Recent;   Good Remote;   Good  Judgement:  Fair  Insight:  Fair  Psychomotor Activity:  Normal  Concentration:   Concentration: Fair and Attention Span: Fair  Recall:  Good  Fund of Knowledge:  Fair  Language:  Good  Akathisia:  No  Handed:  Right  AIMS (if indicated):     Assets:  Housing Leisure Time Resilience  ADL's:  Intact  Cognition:  WNL  Sleep:        Physical Exam: Physical Exam Vitals and nursing note reviewed.  Constitutional:      Appearance: He is obese.  HENT:     Head: Normocephalic.     Nose: Nose normal.  Musculoskeletal:     Cervical back: Normal range of motion.  Neurological:     General: No focal deficit present.     Mental Status: He is alert and oriented to person, place, and time.  Psychiatric:        Attention and Perception: Attention and perception normal.        Mood and Affect: Mood is anxious.        Speech: Speech normal.        Behavior: Behavior normal. Behavior is cooperative.        Thought Content: Thought content normal.        Cognition and Memory: Cognition and memory normal.        Judgment: Judgment normal.    Review of Systems  Musculoskeletal:  Positive for myalgias.  Psychiatric/Behavioral:  The patient is nervous/anxious.   All other systems reviewed and are negative.  Blood pressure 117/74, pulse 72, temperature 97.6 F (36.4 C), resp. rate 16, SpO2 98 %. There is no height or weight on file to calculate BMI.  Treatment Plan Summary: Stress reaction with mixed disturbance of emotion and conduct: Risperdal 0.25 mg BID PRN agitation Zyprexa 5 mg IM PRN agitation  Disposition: No evidence of imminent risk to self or others at present.   Patient does not meet criteria for psychiatric inpatient admission.  Waylan Boga, NP 04/24/2022 10:13 AM
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
## 2022-04-24 NOTE — H&P (Signed)
Addendum to A/P: ESRD: Nephrology consulted Avoid contrast Renally dose meds Pts creatinine has trended up and being followed by nephrology.

## 2022-04-24 NOTE — Progress Notes (Signed)
Received patient in bed to unit.  Alert and oriented.  Informed consent signed and in chart.   Lostant duration:2.5  Patient tolerated well.  Transported back to the room  Alert, without acute distress.  Hand-off given to patient's nurse.   Access used: dialysis cath Access issues: none  Total UF removed: 0 Medication(s) given: none Post HD VS: see table below Post HD weight: 173.7kg   04/24/22 2030  Vitals  Temp 97.6 F (36.4 C)  Temp Source Oral  BP 109/74  MAP (mmHg) 84  BP Location Right Wrist  BP Method Automatic  Patient Position (if appropriate) Lying  Pulse Rate 62  Pulse Rate Source Monitor  ECG Heart Rate 82  Resp 17  Oxygen Therapy  SpO2 (!) 88 %  O2 Device Nasal Cannula  O2 Flow Rate (L/min) 3 L/min  Patient Activity (if Appropriate) In bed  Pulse Oximetry Type Continuous  During Treatment Monitoring  Intra-Hemodialysis Comments Tolerated well  Post Treatment  Dialyzer Clearance Heavily streaked  Duration of HD Treatment -hour(s) 2.5 hour(s)  Hemodialysis Intake (mL) 0 mL  Liters Processed 30  Fluid Removed (mL) 0 mL  Tolerated HD Treatment Yes  Hemodialysis Catheter Right Internal jugular Triple lumen Temporary (Non-Tunneled)  Placement Date/Time: 04/24/22 1629   Time Out: Correct patient;Correct site;Correct procedure  Maximum sterile barrier precautions: Hand hygiene;Sterile gloves;Large sterile sheet;Cap;Mask;Sterile gown  Site Prep: Chlorhexidine (preferred);Skin Prep C...  Site Condition No complications  Blue Lumen Status Infusing;Flushed;Dead end cap in place;Heparin locked  Red Lumen Status Flushed;Heparin locked;Dead end cap in place  Purple Lumen Status N/A  Catheter fill solution Heparin 1000 units/ml  Catheter fill volume (Arterial) 1.4 cc  Catheter fill volume (Venous) 1.4  Post treatment catheter status Capped and [facility]
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
## 2022-04-24 NOTE — Op Note (Signed)
  OPERATIVE NOTE   PROCEDURE: Insertion of temporary dialysis catheter catheter right IJ approach.  PRE-OPERATIVE DIAGNOSIS: Acute on chronic renal insufficiency  POST-OPERATIVE DIAGNOSIS: Same  SURGEON: Katha Cabal M.D.  ANESTHESIA: 1% lidocaine local infiltration  ESTIMATED BLOOD LOSS: Minimal cc  INDICATIONS:   [name] is a 57 y.o. male who presents with acute on chronic renal insufficiency and severe mental status changes.  He now requires hemodialysis.  Temporary catheter is being placed so that dialysis can be initiated emergently..  DESCRIPTION: After obtaining full informed written consent, the patient was positioned supine. The right neck was prepped and draped in a sterile fashion. Ultrasound was placed in a sterile sleeve. Ultrasound was utilized to identify the right internal jugular vein which is noted to be echolucent and compressible indicating patency. Images recorded for the permanent record. Under real-time visualization a Seldinger needle is inserted into the vein and the guidewires advanced without difficulty. Small counterincision was made at the wire insertion site. Dilator is passed over the wire and the temporary dialysis catheter catheter is fed over the wire without difficulty.  All lumens aspirate and flush easily and are packed with heparin saline. Catheter secured to the skin of the right neck with 2-0 silk. A sterile dressing is applied with Biopatch.  COMPLICATIONS: None  CONDITION: Unchanged  Hortencia Pilar Office:  [phone] 04/24/2022, 4:55 PM

## 2022-04-24 NOTE — Assessment & Plan Note (Signed)
Attribute pt's mental status changes to hypercapnia from obesity hypoventilation. Pt on NIPPV-bipap. ABG    Component Value Date/Time   PHART 7.22 (L) 03/01/2018 0502   PCO2ART 62 (H) 03/01/2018 0502   PO2ART 56 (L) 03/01/2018 0502   HCO3 30.3 (H) 04/23/2022 2327   ACIDBASEDEF 3.5 (H) 03/01/2018 0502   O2SAT 96 04/23/2022 2327

## 2022-04-24 NOTE — Assessment & Plan Note (Signed)
Pt's last echo in 10 /2023 showed:  Pt started on lasix drip per nephrology.  I have held the bumex, metolazone and coreg.
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
## 2022-04-24 NOTE — BH Assessment (Signed)
IVC  papers  rescinded  per  Gust Rung  NP  informed  RN  Garnette Gunner papers  sent  back  to  the  floor

## 2022-04-25 DIAGNOSIS — R400 Somnolence: Secondary | ICD-10-CM | POA: Diagnosis not present

## 2022-04-25 LAB — BLOOD GAS, VENOUS
Acid-Base Excess: 6 mmol/L — ABNORMAL HIGH (ref 0.0–2.0)
Bicarbonate: 34.7 mmol/L — ABNORMAL HIGH (ref 20.0–28.0)
O2 Saturation: 85.2 %
Patient temperature: 37
pCO2, Ven: 69 mmHg — ABNORMAL HIGH (ref 44–60)
pH, Ven: 7.31 (ref 7.25–7.43)
pO2, Ven: 54 mmHg — ABNORMAL HIGH (ref 32–45)

## 2022-04-25 LAB — COMPREHENSIVE METABOLIC PANEL
ALT: 22 U/L (ref 0–44)
ALT: 20 U/L (ref 0–44)
AST: 26 U/L (ref 15–41)
AST: 20 U/L (ref 15–41)
Albumin: 2.9 g/dL — ABNORMAL LOW (ref 3.5–5.0)
Albumin: 2.7 g/dL — ABNORMAL LOW (ref 3.5–5.0)
Alkaline Phosphatase: 170 U/L — ABNORMAL HIGH (ref 38–126)
Alkaline Phosphatase: 161 U/L — ABNORMAL HIGH (ref 38–126)
Anion gap: 11 (ref 5–15)
Anion gap: 8 (ref 5–15)
BUN: 92 mg/dL — ABNORMAL HIGH (ref 6–20)
BUN: 75 mg/dL — ABNORMAL HIGH (ref 6–20)
CO2: 29 mmol/L (ref 22–32)
CO2: 27 mmol/L (ref 22–32)
Calcium: 8.6 mg/dL — ABNORMAL LOW (ref 8.9–10.3)
Calcium: 8.2 mg/dL — ABNORMAL LOW (ref 8.9–10.3)
Chloride: 100 mmol/L (ref 98–111)
Chloride: 103 mmol/L (ref 98–111)
Creatinine, Ser: 4.26 mg/dL — ABNORMAL HIGH (ref 0.61–1.24)
Creatinine, Ser: 3.69 mg/dL — ABNORMAL HIGH (ref 0.61–1.24)
GFR, Estimated: 16 mL/min — ABNORMAL LOW (ref >60)
GFR, Estimated: 19 mL/min — ABNORMAL LOW (ref >60)
Glucose, Bld: 169 mg/dL — ABNORMAL HIGH (ref 70–99)
Glucose, Bld: 130 mg/dL — ABNORMAL HIGH (ref 70–99)
Potassium: 4.8 mmol/L (ref 3.5–5.1)
Potassium: 4.1 mmol/L (ref 3.5–5.1)
Sodium: 140 mmol/L (ref 135–145)
Sodium: 138 mmol/L (ref 135–145)
Total Bilirubin: 0.9 mg/dL (ref 0.3–1.2)
Total Bilirubin: 1 mg/dL (ref 0.3–1.2)
Total Protein: 7.1 g/dL (ref 6.5–8.1)
Total Protein: 6.7 g/dL (ref 6.5–8.1)

## 2022-04-25 LAB — GLUCOSE, CAPILLARY
Glucose-Capillary: 95 mg/dL (ref 70–99)
Glucose-Capillary: 164 mg/dL — ABNORMAL HIGH (ref 70–99)
Glucose-Capillary: 147 mg/dL — ABNORMAL HIGH (ref 70–99)
Glucose-Capillary: 124 mg/dL — ABNORMAL HIGH (ref 70–99)
Glucose-Capillary: 127 mg/dL — ABNORMAL HIGH (ref 70–99)

## 2022-04-25 LAB — AMMONIA: Ammonia: 64 umol/L — ABNORMAL HIGH (ref 9–35)

## 2022-04-25 LAB — CBC
HCT: 44.2 % (ref 39.0–52.0)
Hemoglobin: 12.4 g/dL — ABNORMAL LOW (ref 13.0–17.0)
MCH: 28.8 pg (ref 26.0–34.0)
MCHC: 28.1 g/dL — ABNORMAL LOW (ref 30.0–36.0)
MCV: 102.6 fL — ABNORMAL HIGH (ref 80.0–100.0)
Platelets: 145 10*3/uL — ABNORMAL LOW (ref 150–400)
RBC: 4.31 MIL/uL (ref 4.22–5.81)
RDW: 18.9 % — ABNORMAL HIGH (ref 11.5–15.5)
WBC: 5.6 10*3/uL (ref 4.0–10.5)
nRBC: 1.8 % — ABNORMAL HIGH (ref 0.0–0.2)

## 2022-04-25 LAB — HEPATITIS B SURFACE ANTIGEN: Hepatitis B Surface Ag: NONREACTIVE

## 2022-04-25 LAB — MAGNESIUM: Magnesium: 2.4 mg/dL (ref 1.7–2.4)

## 2022-04-25 MED ORDER — OLANZAPINE 5 MG PO TBDP
5.0000 mg | ORAL_TABLET | Freq: Once | ORAL | Status: AC
  Administered 2022-04-25: 5 mg via ORAL
  Filled 2022-04-25: qty 1

## 2022-04-25 MED ORDER — HEPARIN SODIUM (PORCINE) 1000 UNIT/ML IJ SOLN
INTRAMUSCULAR | Status: AC
  Filled 2022-04-25: qty 10

## 2022-04-25 MED ORDER — ACETAMINOPHEN 325 MG PO TABS
ORAL_TABLET | ORAL | Status: AC
  Filled 2022-04-25: qty 2

## 2022-04-25 NOTE — Progress Notes (Signed)
  Received patient in bed to unit.   Informed consent signed and in chart.    TX duration:3 hrs     Transported back to floor  Hand-off given to patient's nurse.    Access used: R Catheter  Access issues: none   Total UF removed: 1.0kg Medication(s) given: tylenol 650mg  Post HD VS: stable Post HD weight: 171.7kg     Darrol Jump LPN Kidney Dialysis Unit
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
## 2022-04-26 DIAGNOSIS — R410 Disorientation, unspecified: Secondary | ICD-10-CM | POA: Diagnosis not present

## 2022-04-26 LAB — CBC
HCT: 40.6 % (ref 39.0–52.0)
Hemoglobin: 11.6 g/dL — ABNORMAL LOW (ref 13.0–17.0)
MCH: 29.1 pg (ref 26.0–34.0)
MCHC: 28.6 g/dL — ABNORMAL LOW (ref 30.0–36.0)
MCV: 101.8 fL — ABNORMAL HIGH (ref 80.0–100.0)
Platelets: 115 10*3/uL — ABNORMAL LOW (ref 150–400)
RBC: 3.99 MIL/uL — ABNORMAL LOW (ref 4.22–5.81)
RDW: 18.9 % — ABNORMAL HIGH (ref 11.5–15.5)
WBC: 5.7 10*3/uL (ref 4.0–10.5)
nRBC: 0.7 % — ABNORMAL HIGH (ref 0.0–0.2)

## 2022-04-26 LAB — COMPREHENSIVE METABOLIC PANEL
ALT: 20 U/L (ref 0–44)
AST: 21 U/L (ref 15–41)
Albumin: 2.7 g/dL — ABNORMAL LOW (ref 3.5–5.0)
Alkaline Phosphatase: 158 U/L — ABNORMAL HIGH (ref 38–126)
Anion gap: 7 (ref 5–15)
BUN: 76 mg/dL — ABNORMAL HIGH (ref 6–20)
CO2: 28 mmol/L (ref 22–32)
Calcium: 8.6 mg/dL — ABNORMAL LOW (ref 8.9–10.3)
Chloride: 103 mmol/L (ref 98–111)
Creatinine, Ser: 3.71 mg/dL — ABNORMAL HIGH (ref 0.61–1.24)
GFR, Estimated: 18 mL/min — ABNORMAL LOW (ref >60)
Glucose, Bld: 150 mg/dL — ABNORMAL HIGH (ref 70–99)
Potassium: 4.1 mmol/L (ref 3.5–5.1)
Sodium: 138 mmol/L (ref 135–145)
Total Bilirubin: 1 mg/dL (ref 0.3–1.2)
Total Protein: 6.8 g/dL (ref 6.5–8.1)

## 2022-04-26 LAB — GLUCOSE, CAPILLARY
Glucose-Capillary: 137 mg/dL — ABNORMAL HIGH (ref 70–99)
Glucose-Capillary: 140 mg/dL — ABNORMAL HIGH (ref 70–99)
Glucose-Capillary: 136 mg/dL — ABNORMAL HIGH (ref 70–99)
Glucose-Capillary: 129 mg/dL — ABNORMAL HIGH (ref 70–99)
Glucose-Capillary: 165 mg/dL — ABNORMAL HIGH (ref 70–99)
Glucose-Capillary: 101 mg/dL — ABNORMAL HIGH (ref 70–99)

## 2022-04-26 LAB — MAGNESIUM: Magnesium: 2.1 mg/dL (ref 1.7–2.4)

## 2022-04-26 MED ORDER — IPRATROPIUM-ALBUTEROL 0.5-2.5 (3) MG/3ML IN SOLN: 3.0000 mL | RESPIRATORY_TRACT | Status: DC | PRN

## 2022-04-26 MED ORDER — SODIUM CHLORIDE 0.9 % IV SOLN: INTRAVENOUS | Status: DC | PRN
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
## 2022-04-26 NOTE — Progress Notes (Signed)
Patient refused for his blood sugar to be checked. Educated patient on the risks of not having his blood sugar check.
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
## 2022-04-26 NOTE — Progress Notes (Signed)
Patient threatening to leave AMA and pulling out right IJ, several attempts made to calm patient down, patient educated on the importance of him staying. MD notified.

## 2022-04-27 ENCOUNTER — Ambulatory Visit: Payer: PPO | Admitting: Internal Medicine

## 2022-04-27 ENCOUNTER — Encounter: Payer: Self-pay | Admitting: Vascular Surgery

## 2022-04-27 ENCOUNTER — Encounter: Payer: Self-pay | Admitting: Oncology

## 2022-04-27 DIAGNOSIS — N179 Acute kidney failure, unspecified: Secondary | ICD-10-CM | POA: Diagnosis not present

## 2022-04-27 DIAGNOSIS — I129 Hypertensive chronic kidney disease with stage 1 through stage 4 chronic kidney disease, or unspecified chronic kidney disease: Secondary | ICD-10-CM | POA: Diagnosis not present

## 2022-04-27 DIAGNOSIS — E1122 Type 2 diabetes mellitus with diabetic chronic kidney disease: Secondary | ICD-10-CM | POA: Diagnosis not present

## 2022-04-27 DIAGNOSIS — N189 Chronic kidney disease, unspecified: Secondary | ICD-10-CM | POA: Diagnosis not present

## 2022-04-27 DIAGNOSIS — R410 Disorientation, unspecified: Secondary | ICD-10-CM | POA: Diagnosis not present

## 2022-04-27 LAB — GLUCOSE, CAPILLARY
Glucose-Capillary: 137 mg/dL — ABNORMAL HIGH (ref 70–99)
Glucose-Capillary: 141 mg/dL — ABNORMAL HIGH (ref 70–99)
Glucose-Capillary: 129 mg/dL — ABNORMAL HIGH (ref 70–99)
Glucose-Capillary: 138 mg/dL — ABNORMAL HIGH (ref 70–99)
Glucose-Capillary: 101 mg/dL — ABNORMAL HIGH (ref 70–99)
Glucose-Capillary: 198 mg/dL — ABNORMAL HIGH (ref 70–99)

## 2022-04-27 LAB — MAGNESIUM: Magnesium: 2.3 mg/dL (ref 1.7–2.4)

## 2022-04-27 LAB — CBC
HCT: 40.6 % (ref 39.0–52.0)
HCT: 40.9 % (ref 39.0–52.0)
Hemoglobin: 11.3 g/dL — ABNORMAL LOW (ref 13.0–17.0)
Hemoglobin: 11.4 g/dL — ABNORMAL LOW (ref 13.0–17.0)
MCH: 28.3 pg (ref 26.0–34.0)
MCH: 28.7 pg (ref 26.0–34.0)
MCHC: 27.8 g/dL — ABNORMAL LOW (ref 30.0–36.0)
MCHC: 27.9 g/dL — ABNORMAL LOW (ref 30.0–36.0)
MCV: 101.5 fL — ABNORMAL HIGH (ref 80.0–100.0)
MCV: 103 fL — ABNORMAL HIGH (ref 80.0–100.0)
Platelets: 106 10*3/uL — ABNORMAL LOW (ref 150–400)
Platelets: 76 10*3/uL — ABNORMAL LOW (ref 150–400)
RBC: 4 MIL/uL — ABNORMAL LOW (ref 4.22–5.81)
RBC: 3.97 MIL/uL — ABNORMAL LOW (ref 4.22–5.81)
RDW: 18.8 % — ABNORMAL HIGH (ref 11.5–15.5)
RDW: 19 % — ABNORMAL HIGH (ref 11.5–15.5)
WBC: 5.4 10*3/uL (ref 4.0–10.5)
WBC: 4.9 10*3/uL (ref 4.0–10.5)
nRBC: 0.7 % — ABNORMAL HIGH (ref 0.0–0.2)
nRBC: 0.4 % — ABNORMAL HIGH (ref 0.0–0.2)

## 2022-04-27 LAB — COMPREHENSIVE METABOLIC PANEL
ALT: 16 U/L (ref 0–44)
AST: 16 U/L (ref 15–41)
Albumin: 2.8 g/dL — ABNORMAL LOW (ref 3.5–5.0)
Alkaline Phosphatase: 149 U/L — ABNORMAL HIGH (ref 38–126)
Anion gap: 7 (ref 5–15)
BUN: 88 mg/dL — ABNORMAL HIGH (ref 6–20)
CO2: 28 mmol/L (ref 22–32)
Calcium: 8.6 mg/dL — ABNORMAL LOW (ref 8.9–10.3)
Chloride: 103 mmol/L (ref 98–111)
Creatinine, Ser: 4.14 mg/dL — ABNORMAL HIGH (ref 0.61–1.24)
GFR, Estimated: 16 mL/min — ABNORMAL LOW (ref >60)
Glucose, Bld: 133 mg/dL — ABNORMAL HIGH (ref 70–99)
Potassium: 4.3 mmol/L (ref 3.5–5.1)
Sodium: 138 mmol/L (ref 135–145)
Total Bilirubin: 1 mg/dL (ref 0.3–1.2)
Total Protein: 6.7 g/dL (ref 6.5–8.1)

## 2022-04-27 LAB — IRON AND TIBC
Iron: 44 ug/dL — ABNORMAL LOW (ref 45–182)
Saturation Ratios: 12 % — ABNORMAL LOW (ref 17.9–39.5)
TIBC: 357 ug/dL (ref 250–450)
UIBC: 313 ug/dL

## 2022-04-27 LAB — RENAL FUNCTION PANEL
Albumin: 2.8 g/dL — ABNORMAL LOW (ref 3.5–5.0)
Anion gap: 7 (ref 5–15)
BUN: 62 mg/dL — ABNORMAL HIGH (ref 6–20)
CO2: 28 mmol/L (ref 22–32)
Calcium: 8.4 mg/dL — ABNORMAL LOW (ref 8.9–10.3)
Chloride: 98 mmol/L (ref 98–111)
Creatinine, Ser: 3.61 mg/dL — ABNORMAL HIGH (ref 0.61–1.24)
GFR, Estimated: 19 mL/min — ABNORMAL LOW (ref >60)
Glucose, Bld: 203 mg/dL — ABNORMAL HIGH (ref 70–99)
Phosphorus: 4.6 mg/dL (ref 2.5–4.6)
Potassium: 4.1 mmol/L (ref 3.5–5.1)
Sodium: 133 mmol/L — ABNORMAL LOW (ref 135–145)

## 2022-04-27 LAB — FERRITIN: Ferritin: 20 ng/mL — ABNORMAL LOW (ref 24–336)

## 2022-04-27 MED ORDER — OXYCODONE HCL 5 MG PO TABS
ORAL_TABLET | ORAL | Status: AC
  Filled 2022-04-27: qty 1

## 2022-04-27 MED ORDER — HEPARIN SODIUM (PORCINE) 1000 UNIT/ML IJ SOLN
INTRAMUSCULAR | Status: AC
  Filled 2022-04-27: qty 3

## 2022-04-27 MED ORDER — OLANZAPINE 5 MG PO TBDP
5.0000 mg | ORAL_TABLET | Freq: Once | ORAL | Status: DC
  Filled 2022-04-27: qty 1

## 2022-04-27 MED ORDER — HEPARIN SODIUM (PORCINE) 1000 UNIT/ML IJ SOLN
1000.0000 [IU] | Freq: Once | INTRAMUSCULAR | Status: AC
  Administered 2022-04-27: 1000 [IU]

## 2022-04-27 NOTE — Progress Notes (Signed)
PT Cancellation Note  Patient Details Name: ICHAEL MCCARTER MRN: [id number] DOB: 01/30/1965   Cancelled Treatment:    Reason Eval/Treat Not Completed: Patient at procedure or test/unavailable.  Chart reviewed and attempted to see pt.  Pt currently out of the room for dialysis. PT to follow up when pt is next available for PT intervention.     Gwenlyn Saran, PT, DPT Physical Therapist - [facility]  04/27/22, 1:20 PM

## 2022-04-27 NOTE — Progress Notes (Signed)
Pt agitated and talking rude to staff, refused the Zyprexa, sitting on side of bed(close to edge), refusing to lay back for safety, demanding coffee, then he ended up spilling a whole cup of coffee on himself. Sitter remains at bedside.

## 2022-04-27 NOTE — Progress Notes (Signed)
100 ml saline flush given due to high arterial pressures RN made aware
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
## 2022-04-27 NOTE — Consult Note (Signed)
   [facility] CM Inpatient Consult   04/27/2022  [name] 15-Jul-1965 [id number]  Orientation with Natividad Brood, [facility] Liaison for review.   Location: [facility] Liaison screened remotely [facility]).   [facility]) [place] Patient: Insurance (HTA MCR)    Primary Care Provider:  Teodora Medici, DO with [facility]   Patient screened for readmission hospitalization with noted extreme high risk score for unplanned readmission risk. [facility] liaison attempted outreach calls to both the pt and his mother however unsuccessful. Pt assessed for potential [facility]) Care Management service needs for post hospital transition for care coordination.  Collaborated with the involved [facility] RN concerning pt's pending discharge disposition.  2:07 PM Addendum: Liaison spoke with mother [name]) of pt concerning [facility] services and requested a follow up call post discharge for this pt for care coordination services (receptive).    Plan:   [facility] hospital liaison RN will continue to follow ongoing disposition for post hospital community care coordination/management needs.  Referral request for community care coordination: pending disposition.   [place] does not replace or interfere with any arrangements made by the Inpatient Transition of Care team.   For questions contact:    Raina Mina, RN, [place] Hours M-F 8:00 am to 5 pm [phone] mobile [phone] [Office toll free line][facility] Office Hours are M-F 8:30 - 5 pm 24 hour nurse advise line [phone] Conceirge  [email or web address]

## 2022-04-27 NOTE — Progress Notes (Signed)
Received patient in bed to unit.  Alert and oriented.  Informed consent signed and in chart.   TX duration:3.5 hours  Patient tolerated well.  Transported back to the room  Alert, without acute distress.  Hand-off given to patient's nurse.   Access used: IJ Catheter Access issues: None  Total UF removed: 2.5L Medication(s) given: None Post HD VS: 154/79 Post HD weight: 174.4kg   [name]

## 2022-04-27 NOTE — Progress Notes (Signed)
       CROSS COVER NOTE  NAME: [name] MRN: [id number] DOB : [birth year] ATTENDING PHYSICIAN: Damita Lack, MD    Date of Service   04/27/2022   HPI/Events of Note   Report/Request "he has Zyprexa ordered IM( he will not let me do IM) can I please have it ordered SL. He is very restless and agitated . They gave him SL last night and it helped thanks"    Interventions   Assessment/Plan:  ODT Zyprexa x1       To reach the provider On-Call:   7AM- 7PM see care teams to locate the attending and reach out to them via [email or web address] [email or web address] 7PM-7AM contact night-coverage If you still have difficulty reaching the appropriate provider, please page the [facility] (Director on Call) for [facility] on amion for assistance  This document was prepared using Systems analyst and may include unintentional dictation errors.  Neomia Glass DNP, MBA, FNP-BC, PMHNP-BC Nurse Practitioner [facility] Pager [phone]

## 2022-04-27 NOTE — Care Management Important Message (Signed)
Important Message  Patient Details  Name: JDEN DURST MRN: [id number] Date of Birth: 05-20-1965   Medicare Important Message Given:  N/A - LOS <3 / Initial given by admissions     Dannette Barbara 04/27/2022, 8:47 AM
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
## 2022-04-27 NOTE — TOC Progression Note (Signed)
Transition of Care [facility]) - Progression Note    Patient Details  Name: [name] MRN: [id number] Date of Birth: [birth year]  Transition of [facility]) CM/SW Contact  Beverly Sessions, RN Phone Number: 04/27/2022, 3:19 PM  Clinical Narrative:    Notified by NT that patient is concerned he will be evicted from his home if he is not discharged from the hospital Patient currently in HD, and under IVC Housing resources added to AVS         Discharge Plan and Services                                               Social Determinants of Health (SDOH) Interventions SDOH Screenings   Food Insecurity: No Food Insecurity (04/24/2022)  Recent Concern: [place] Present (04/17/2022)  Housing: Medium Risk (04/24/2022)  Transportation Needs: No Transportation Needs (04/24/2022)  Utilities: Not At Risk (04/24/2022)  Alcohol Screen: Low Risk  (03/24/2022)  Depression (PHQ2-9): Low Risk  (04/22/2022)  Recent Concern: Depression (PHQ2-9) - High Risk (04/17/2022)  Tobacco Use: Low Risk  (04/27/2022)    Readmission Risk Interventions     No data to display
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
## 2022-04-28 DIAGNOSIS — L03115 Cellulitis of right lower limb: Secondary | ICD-10-CM

## 2022-04-28 DIAGNOSIS — N179 Acute kidney failure, unspecified: Secondary | ICD-10-CM | POA: Diagnosis not present

## 2022-04-28 DIAGNOSIS — G4719 Other hypersomnia: Secondary | ICD-10-CM

## 2022-04-28 DIAGNOSIS — R0689 Other abnormalities of breathing: Secondary | ICD-10-CM

## 2022-04-28 DIAGNOSIS — R410 Disorientation, unspecified: Secondary | ICD-10-CM | POA: Diagnosis not present

## 2022-04-28 LAB — COMPREHENSIVE METABOLIC PANEL WITH GFR
ALT: 17 U/L (ref 0–44)
AST: 15 U/L (ref 15–41)
Albumin: 2.8 g/dL — ABNORMAL LOW (ref 3.5–5.0)
Alkaline Phosphatase: 152 U/L — ABNORMAL HIGH (ref 38–126)
Anion gap: 10 (ref 5–15)
BUN: 67 mg/dL — ABNORMAL HIGH (ref 6–20)
CO2: 27 mmol/L (ref 22–32)
Calcium: 8.6 mg/dL — ABNORMAL LOW (ref 8.9–10.3)
Chloride: 98 mmol/L (ref 98–111)
Creatinine, Ser: 4.07 mg/dL — ABNORMAL HIGH (ref 0.61–1.24)
GFR, Estimated: 16 mL/min — ABNORMAL LOW
Glucose, Bld: 123 mg/dL — ABNORMAL HIGH (ref 70–99)
Potassium: 4.4 mmol/L (ref 3.5–5.1)
Sodium: 135 mmol/L (ref 135–145)
Total Bilirubin: 0.9 mg/dL (ref 0.3–1.2)
Total Protein: 6.9 g/dL (ref 6.5–8.1)

## 2022-04-28 LAB — GLUCOSE, CAPILLARY
Glucose-Capillary: 140 mg/dL — ABNORMAL HIGH (ref 70–99)
Glucose-Capillary: 141 mg/dL — ABNORMAL HIGH (ref 70–99)
Glucose-Capillary: 151 mg/dL — ABNORMAL HIGH (ref 70–99)
Glucose-Capillary: 153 mg/dL — ABNORMAL HIGH (ref 70–99)
Glucose-Capillary: 162 mg/dL — ABNORMAL HIGH (ref 70–99)
Glucose-Capillary: 184 mg/dL — ABNORMAL HIGH (ref 70–99)
Glucose-Capillary: 195 mg/dL — ABNORMAL HIGH (ref 70–99)

## 2022-04-28 LAB — CBC
HCT: 41.6 % (ref 39.0–52.0)
Hemoglobin: 11.6 g/dL — ABNORMAL LOW (ref 13.0–17.0)
MCH: 28.8 pg (ref 26.0–34.0)
MCHC: 27.9 g/dL — ABNORMAL LOW (ref 30.0–36.0)
MCV: 103.2 fL — ABNORMAL HIGH (ref 80.0–100.0)
Platelets: 79 10*3/uL — ABNORMAL LOW (ref 150–400)
RBC: 4.03 MIL/uL — ABNORMAL LOW (ref 4.22–5.81)
RDW: 18.8 % — ABNORMAL HIGH (ref 11.5–15.5)
WBC: 5.5 10*3/uL (ref 4.0–10.5)
nRBC: 0.4 % — ABNORMAL HIGH (ref 0.0–0.2)

## 2022-04-28 LAB — MAGNESIUM: Magnesium: 2.1 mg/dL (ref 1.7–2.4)

## 2022-04-28 LAB — HEPATITIS B SURFACE ANTIBODY, QUANTITATIVE: Hep B S AB Quant (Post): <3.5 m[IU]/mL — ABNORMAL LOW (ref >9.9)
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
## 2022-04-28 NOTE — Discharge Planning (Signed)
Working on referral for [place] at [facility]. Still require a HepB total core for a complete referral. [place] NP is aware and ordering.
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
## 2022-04-28 NOTE — Progress Notes (Signed)
PT Cancellation Note  Patient Details Name: WYATTE ORECCHIO MRN: [id number] DOB: 05/11/1965   Cancelled Treatment:    Reason Eval/Treat Not Completed: Patient declined, no reason specified (Pt in [place] on entry, 1:1 sitter in room. Pt denies any desire for PT at this time, reports a fairly active day, with transfers and AMB.) Pt takes time to express concerns he has regarding mobility difficulty he has had for several months, does not sound like he has good insight into his situation at present. Pt asks for author to push him around the hallway, Chief Strategy Officer educates pt on role of PT in progressing pt's independence in mobility rather than providing passive mobility/transport. Author to return again next day.   3:35 PM, 04/28/22 Etta Grandchild, PT, DPT Physical Therapist - [facility]  [phone] ([place])     [place] 04/28/2022, 3:33 PM

## 2022-04-29 ENCOUNTER — Encounter
Admission: EM | Discharge status: Against Medical Advice | Payer: Self-pay | Source: Home / Self Care | Attending: Internal Medicine

## 2022-04-29 ENCOUNTER — Encounter: Payer: Self-pay | Admitting: Vascular Surgery

## 2022-04-29 ENCOUNTER — Ambulatory Visit: Payer: Self-pay | Admitting: *Deleted

## 2022-04-29 DIAGNOSIS — E1122 Type 2 diabetes mellitus with diabetic chronic kidney disease: Secondary | ICD-10-CM | POA: Diagnosis not present

## 2022-04-29 DIAGNOSIS — Z992 Dependence on renal dialysis: Secondary | ICD-10-CM | POA: Diagnosis not present

## 2022-04-29 DIAGNOSIS — N186 End stage renal disease: Secondary | ICD-10-CM | POA: Diagnosis not present

## 2022-04-29 DIAGNOSIS — Z9889 Other specified postprocedural states: Secondary | ICD-10-CM

## 2022-04-29 DIAGNOSIS — N189 Chronic kidney disease, unspecified: Secondary | ICD-10-CM | POA: Diagnosis not present

## 2022-04-29 DIAGNOSIS — Z794 Long term (current) use of insulin: Secondary | ICD-10-CM

## 2022-04-29 DIAGNOSIS — N179 Acute kidney failure, unspecified: Secondary | ICD-10-CM | POA: Diagnosis not present

## 2022-04-29 DIAGNOSIS — R410 Disorientation, unspecified: Secondary | ICD-10-CM | POA: Diagnosis not present

## 2022-04-29 DIAGNOSIS — N184 Chronic kidney disease, stage 4 (severe): Secondary | ICD-10-CM

## 2022-04-29 HISTORY — PX: DIALYSIS/PERMA CATHETER INSERTION: CATH118288

## 2022-04-29 LAB — CBC
HCT: 37.3 % — ABNORMAL LOW (ref 39.0–52.0)
Hemoglobin: 10.9 g/dL — ABNORMAL LOW (ref 13.0–17.0)
MCH: 29.1 pg (ref 26.0–34.0)
MCHC: 29.2 g/dL — ABNORMAL LOW (ref 30.0–36.0)
MCV: 99.5 fL (ref 80.0–100.0)
Platelets: 95 10*3/uL — ABNORMAL LOW (ref 150–400)
RBC: 3.75 MIL/uL — ABNORMAL LOW (ref 4.22–5.81)
RDW: 18.6 % — ABNORMAL HIGH (ref 11.5–15.5)
WBC: 6.5 10*3/uL (ref 4.0–10.5)
nRBC: 0 % (ref 0.0–0.2)

## 2022-04-29 LAB — MAGNESIUM: Magnesium: 2.2 mg/dL (ref 1.7–2.4)

## 2022-04-29 LAB — COMPREHENSIVE METABOLIC PANEL
ALT: 15 U/L (ref 0–44)
AST: 16 U/L (ref 15–41)
Albumin: 2.6 g/dL — ABNORMAL LOW (ref 3.5–5.0)
Alkaline Phosphatase: 139 U/L — ABNORMAL HIGH (ref 38–126)
Anion gap: 6 (ref 5–15)
BUN: 80 mg/dL — ABNORMAL HIGH (ref 6–20)
CO2: 27 mmol/L (ref 22–32)
Calcium: 8.6 mg/dL — ABNORMAL LOW (ref 8.9–10.3)
Chloride: 102 mmol/L (ref 98–111)
Creatinine, Ser: 4.51 mg/dL — ABNORMAL HIGH (ref 0.61–1.24)
GFR, Estimated: 15 mL/min — ABNORMAL LOW (ref >60)
Glucose, Bld: 123 mg/dL — ABNORMAL HIGH (ref 70–99)
Potassium: 4.2 mmol/L (ref 3.5–5.1)
Sodium: 135 mmol/L (ref 135–145)
Total Bilirubin: 1.2 mg/dL (ref 0.3–1.2)
Total Protein: 6.6 g/dL (ref 6.5–8.1)

## 2022-04-29 LAB — PARATHYROID HORMONE, INTACT (NO CA): PTH: 73 pg/mL — ABNORMAL HIGH (ref 15–65)

## 2022-04-29 LAB — GLUCOSE, CAPILLARY
Glucose-Capillary: 140 mg/dL — ABNORMAL HIGH (ref 70–99)
Glucose-Capillary: 95 mg/dL (ref 70–99)
Glucose-Capillary: 87 mg/dL (ref 70–99)

## 2022-04-29 LAB — HEPATITIS B CORE ANTIBODY, TOTAL: Hep B Core Total Ab: NONREACTIVE

## 2022-04-29 SURGERY — DIALYSIS/PERMA CATHETER INSERTION
Anesthesia: Moderate Sedation

## 2022-04-29 MED ORDER — HEPARIN SODIUM (PORCINE) 10000 UNIT/ML IJ SOLN
INTRAMUSCULAR | Status: AC
  Filled 2022-04-29: qty 1

## 2022-04-29 MED ORDER — HALOPERIDOL LACTATE 5 MG/ML IJ SOLN: 1.0000 mg | Freq: Four times a day (QID) | INTRAMUSCULAR | Status: DC | PRN

## 2022-04-29 MED ORDER — CEFAZOLIN SODIUM-DEXTROSE 1-4 GM/50ML-% IV SOLN
INTRAVENOUS | Status: AC
  Filled 2022-04-29: qty 50

## 2022-04-29 MED ORDER — SODIUM CHLORIDE 0.9 % IV SOLN: INTRAVENOUS | Status: DC

## 2022-04-29 MED ORDER — METHYLPREDNISOLONE SODIUM SUCC 125 MG IJ SOLR: 125.0000 mg | Freq: Once | INTRAMUSCULAR | Status: DC | PRN

## 2022-04-29 MED ORDER — MIDAZOLAM HCL 2 MG/2ML IJ SOLN
INTRAMUSCULAR | Status: AC
  Filled 2022-04-29: qty 2

## 2022-04-29 MED ORDER — MIDAZOLAM HCL 2 MG/ML PO SYRP: 8.0000 mg | ORAL_SOLUTION | Freq: Once | ORAL | Status: DC | PRN

## 2022-04-29 MED ORDER — LIDOCAINE-EPINEPHRINE (PF) 2 %-1:200000 IJ SOLN
INTRAMUSCULAR | Status: AC
  Filled 2022-04-29: qty 20

## 2022-04-29 MED ORDER — FENTANYL CITRATE (PF) 100 MCG/2ML IJ SOLN
INTRAMUSCULAR | Status: DC | PRN
  Administered 2022-04-29: 50 ug via INTRAVENOUS

## 2022-04-29 MED ORDER — CEFAZOLIN SODIUM-DEXTROSE 1-4 GM/50ML-% IV SOLN
1.0000 g | INTRAVENOUS | Status: AC
  Administered 2022-04-29: 1 g via INTRAVENOUS

## 2022-04-29 MED ORDER — DIPHENHYDRAMINE HCL 50 MG/ML IJ SOLN: 50.0000 mg | Freq: Once | INTRAMUSCULAR | Status: DC | PRN

## 2022-04-29 MED ORDER — MIDAZOLAM HCL 2 MG/2ML IJ SOLN
INTRAMUSCULAR | Status: DC | PRN
  Administered 2022-04-29: 2 mg via INTRAVENOUS

## 2022-04-29 MED ORDER — FAMOTIDINE 20 MG PO TABS: 40.0000 mg | ORAL_TABLET | Freq: Once | ORAL | Status: DC | PRN

## 2022-04-29 MED ORDER — ONDANSETRON HCL 4 MG/2ML IJ SOLN: 4.0000 mg | Freq: Four times a day (QID) | INTRAMUSCULAR | Status: DC | PRN

## 2022-04-29 MED ORDER — FENTANYL CITRATE PF 50 MCG/ML IJ SOSY: 12.5000 ug | PREFILLED_SYRINGE | Freq: Once | INTRAMUSCULAR | Status: DC | PRN

## 2022-04-29 MED ORDER — FENTANYL CITRATE PF 50 MCG/ML IJ SOSY
PREFILLED_SYRINGE | INTRAMUSCULAR | Status: AC
  Filled 2022-04-29: qty 1

## 2022-04-29 MED ORDER — HYDROMORPHONE HCL 1 MG/ML IJ SOLN: 1.0000 mg | Freq: Once | INTRAMUSCULAR | Status: DC | PRN

## 2022-04-29 SURGICAL SUPPLY — 7 items
ADH SKN CLS APL DERMABOND .7 (GAUZE/BANDAGES/DRESSINGS) ×2
CATH PALINDROME-P 19CM W/VT (CATHETERS) IMPLANT
DERMABOND ADVANCED .7 DNX12 (GAUZE/BANDAGES/DRESSINGS) IMPLANT
GUIDEWIRE SUPER STIFF .035X180 (WIRE) IMPLANT
PACK ANGIOGRAPHY (CUSTOM PROCEDURE TRAY) IMPLANT
SUT MNCRL AB 4-0 PS2 18 (SUTURE) IMPLANT
SUT PROLENE 0 CT 1 30 (SUTURE) IMPLANT

## 2022-04-29 NOTE — Discharge Instructions (Signed)
Rent/Utility/Housing  Agency Name: [facility] Address: [address] [place], [place] [address] Phone: [phone] Email: [email or web address] Website: www.[email or web address].org Service(s) Offered: Housing services, self-sufficiency, congregate meal  program, weatherization program, heating appliance  repair/replacement program, emergency food assistance,  housing counseling, home ownership program, wheels -towork program.  Agency Name: [facility] Address: [address], [place], [place] [address] Phone: [phone] (8a-4p) [phone] (8p- 10p) Email: [email or web address] Website: [email or web address] Service(s) Offered: A program for homeless and/or needy men that includes one-on-one counseling, life skills training and job rehabilitation.  Agency Name: [facility] Address: [address], [place], [place] [address] Phone: [phone] Website: www.[facility]iedchurches.org Service(s) Offered: Assistance to needy in emergency with utility bills, heating  fuel, and prescriptions. Shelter for homeless 7pm-7am. May 21, 2016 15  Agency Name: [facility] Address: [address], [place], [place] [address] Phone: [phone] Contact Person: Wayne Dawson Email: [email or web address] Website: [email or web address].org Service(s) Offered: Helps individuals with developmental disabilities move  from housing that is more restrictive to homes where they  can achieve greater independence and have more  opportunities.  Agency Name: [facility] Address: [address], [place], [place] [address] Phone: [phone] Email: [email or web address] Website: www.[facility].org Service(s) Offered: Provides affordable housing for low-income families,  elderly, and disabled individuals. Offer a wide range of  programs and services, from financial planning  to afterschool and summer programs.  Agency Name: [facility] Address: [address] [place], [place], [place] [address] Phone: [phone] Service(s) Offered: Child support services; child welfare services; food stamps;  Medicaid; work first family assistance; and aid with fuel,  rent, food and medicine.  Agency Name: [facility] Address: [facility] [place], [place]  [address] Phone: [phone] Website: www.familyabuseservices.org Service(s) Offered: 24 hour Crisis Line: 226-5985; 24 hour Emergency Shelter;  Transitional Housing; Support Groups; Court Advocacy;  Community Education; Hispanic Outreach: 228-9040;  Visitation Center: 226-7433. May 21, 2016 16  Agency Name: [facility] Address: [address] [place], [place] [address] Phone: [phone] Service(s) Offered: CAP Services; Home and Community Supports; Individual  or Group Supports; Respite Care Non-Institutional Nursing;  Residential Supports; Respite Care and Personal Care  Services; Transportation; Family and Friends Night;  Recreational Activities; Three Nutritious Meals/Snacks;  Consultation with Registered Dietician; Twenty-four hour  Registered Nurse Access; Daily and Community Living  Skills; Camp Green Leaves; Summer Camp for the Special  Population (During Summer Months) Bingo Night (Every  Wednesday Night); Special Populations Dance Night  (Every Tuesday Night); Professional Hair Care Services.  Agency Name: [facility] Address: [address], [place], [place] [address] Phone: [phone] Contact Person: Gloria McCauley Website: [email or web address] Service(s) Offered: Residential treatment facility for women; food and  clothing, educational & employment development and  transportation to work; development of financial skills;  parenting and family  reunification; emotional and spiritual  support; transitional housing for program graduates.  Agency Name: [place] [facility] Address: [address], [place], [place] [address] Phone: [phone] Email: [email or web address] Website: [email or web address] Service(s) Offered: Public housing units for elderly, disabled, and low income  people; housing choice vouchers for income eligible  applicants; shelter plus care vouchers; and HOPWA  voucher program. May 21, 2016 17  Agency Name: [facility] Address: [address], , [place] [address] Phone: [phone] Email: [email or web address] Website: www.habitatalamance.org Service(s) Offered: Build houses for families in need of decent housing. Each    adult in the family must invest 200 hours of labor on  someone else's house, work with volunteers to build their  own house, attend classes on budgeting, home maintenance, yard care, and attend homeowner association  meetings.  Agency Name: [facility] Address: [address], [place], [place] [address] Phone: [phone] Website: www.rsli.org Service(s) Offered: Intermediate care facilities for mentally retarded,  Supervised Living in group homes for adults with  developmental disabilities, Supervised Living for people  who have dual diagnoses (MRMI), Independent Living,  Supported Living, respite and a variety of CAP services,  pre-vocational services, day supports, and Community  Support Services.  Agency Name: [place] [employer] Phone: [phone] Website: www.NCForeclosurePrevention.gov Service(s) Offered: Zero-interest, deferred loans to homeowners struggling to  pay their mortgage. Call for more information  Transportation Agency Name: [facility] Address: [address] [place], [place] [address] Phone: [phone] Email: [email or web address] Website:  www.alamanceservices.org Service(s) Offered: Housing services, self-sufficiency, congregate meal  program, weatherization program, heating appliance  repair/replacement program, emergency food assistance,  housing counseling, home ownership program, wheels-towork program. May 21, 2016 22  Agency Name: [facility] [place] [facility] [employer] [facility] ([employer] Address: [address], [place], [place] [address] Phone: [phone] Email:  Website: www.acta-[place].com Service(s) Offered: Transportation for general public, subscription and demand  response; Dial-a-Ride for citizens 60 years of age or older. Agency Name: [facility] [employer] [facility] Address: [address], [place], [place] [address] Phone: [phone] Service(s) Offered: Child support services; child welfare services; food stamps;  Medicaid; work first family assistance; and aid with fuel,  rent, food and medicine, transportation assistance.  Agency Name: [employer]  [employer] Address: Phone: [phone] Service(s) Offered: Transports veterans to the [facility]. Call  forty-eight hours in advance and leave the name, telephone  number, date, and time of appointment. Veteran will be  contacted by the driver the day before the appointment to  arrange a pick up point  

## 2022-04-29 NOTE — Patient Outreach (Signed)
  Care Coordination   Follow Up Visit Note   04/29/2022 Name: [name] MRN: [id number] DOB: Aug 28, 1965  [name] is a 57 y.o. year old male who sees Teodora Medici, DO for primary care.   What matters to the patients health and wellness today?  Patient admitted to hospital since 3/28, currently under IVC.  Will continue to collaborate with hospital liaison regarding disposition and follow up.     SDOH assessments and interventions completed:  No     Care Coordination Interventions:  Yes, provided  Interventions Today    Flowsheet Row Most Recent Value  General Interventions   General Interventions Discussed/Reviewed Communication with  Communication with --  [Hospital liaison]        Follow up plan:  pending discharge from hospital    Encounter Outcome:  Pt. Visit Completed   Valente David, RN, MSN, [place] [facility] Care Management Coordinator [phone]

## 2022-04-29 NOTE — Progress Notes (Addendum)
1420: Mom states concerns about patient leaving the hospital stating that he does not have housing or transportation to dialysis.  1627: Per Dr. Dwyane Dee patient is capable of making medical decisions and IVC and sitter are no longer necessary. Patient asking to be discharged home. Patient states that he has a friend and a cousin that can assist in transportation home, a place to stay until his apartment is ready and can provide transportation to dialysis. Per Dr. Dwyane Dee there is no plan to DC today and patient may sign AMA if he would like. Plan of patient to sign AMA was discussed with MD, charge RN and Unit Director. Patient educated on why physician would like him to stay however he signed AMA form and called his friend Jenny Reichmann to come get him. IV was removed at this time.  1742: Hemodialysis catheter dressing clean dry and in act at time of discharge and education provided about care of catheter and dressing. Patient's friend, Jenny Reichmann, arrived on unit to take patient home and wheeled patient down to be discharged via private auto in [place] care.

## 2022-04-29 NOTE — Discharge Planning (Signed)
PLACEMENT RESOLVED: [facility]  [id number], [place] [address] [phone]  Scheduled days: Monday Wednesday and Friday  Treatment time: 6:15am Frist Treatment: 4/5 @ 6:00am

## 2022-04-29 NOTE — Op Note (Signed)
OPERATIVE NOTE    PRE-OPERATIVE DIAGNOSIS: 1. ESRD   POST-OPERATIVE DIAGNOSIS: same as above  PROCEDURE: Fluoroscopic guidance for placement of catheter Placement of a 19 cm tip to cuff tunneled hemodialysis catheter via the right internal jugular vein  SURGEON: Leotis Pain, MD  ANESTHESIA:  Local with Moderate conscious sedation for approximately 18 minutes using 2 mg of Versed and 50 mcg of Fentanyl  ESTIMATED BLOOD LOSS: 10 cc  FLUORO TIME: less than one minute  CONTRAST: none  FINDING(S): 1.  Patent right internal jugular vein  SPECIMEN(S):  None  INDICATIONS:   [name] is a 57 y.o.male who presents with renal failure.  The patient needs long term dialysis access for their ESRD, and a Permcath is necessary.  Risks and benefits are discussed and informed consent is obtained.    DESCRIPTION: After obtaining full informed written consent, the patient was brought back to the vascular suited. The patient's right neck and chest were sterilely prepped and draped in a sterile surgical field was created. Moderate conscious sedation was administered during a face to face encounter with the patient throughout the procedure with my supervision of the RN administering medicines and monitoring the patient's vital signs, pulse oximetry, telemetry and mental status throughout from the start of the procedure until the patient was taken to the recovery room.  The existing temporary dialysis catheter was prepped as well and was used for access. A wire was placed through the existing temporary dialysis catheter and the catheter was removed. After skin nick and dilatation, the peel-away sheath was placed over the wire. I then turned my attention to an area under the clavicle. Approximately 1-2 fingerbreadths below the clavicle a small counterincision was created and tunneled from the subclavicular incision to the access site. Using fluoroscopic guidance, a 19 centimeter tip to cuff tunneled  hemodialysis catheter was selected, and tunneled from the subclavicular incision to the access site. It was then placed through the peel-away sheath and the peel-away sheath was removed. Using fluoroscopic guidance the catheter tips were parked in the right atrium. The appropriate distal connectors were placed. It withdrew blood well and flushed easily with heparinized saline and a concentrated heparin solution was then placed. It was secured to the chest wall with 2 Prolene sutures. The access incision was closed single 4-0 Monocryl. A 4-0 Monocryl pursestring suture was placed around the exit site. Sterile dressings were placed. The patient tolerated the procedure well and was taken to the recovery room in stable condition.  COMPLICATIONS: None  CONDITION: Stable  Leotis Pain, MD 04/29/2022 2:58 PM   This note was created with Dragon Medical transcription system. Any errors in dictation are purely unintentional.
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
## 2022-04-29 NOTE — Progress Notes (Signed)
  Received patient in bed to unit.   Informed consent signed and in chart.    TX duration:3.5hrs     Transported to specials for procedure  Hand-off given to patient's nurse.    Access used: R IJ Access issues: none   Total UF removed: 2.5kg Medication(s) given: none Post HD VS: stable Post HD weight: 173.1     Darrol Jump  [facility]

## 2022-04-29 NOTE — Interval H&P Note (Signed)
History and Physical Interval Note:  04/29/2022 1:59 PM  [name]  has presented today for surgery, with the diagnosis of esrd.  The various methods of treatment have been discussed with the patient and family. After consideration of risks, benefits and other options for treatment, the patient has consented to  Procedure(s): DIALYSIS/PERMA CATHETER INSERTION (N/A) as a surgical intervention.  The patient's history has been reviewed, patient examined, no change in status, stable for surgery.  I have reviewed the patient's chart and labs.  Questions were answered to the patient's satisfaction.     Leotis Pain

## 2022-04-29 NOTE — Progress Notes (Signed)
Reached out to Dr. Oran Rein about IVC order expiring at 0900. Dr. Dwyane Dee will review chart.

## 2022-04-29 NOTE — Progress Notes (Signed)
Dr. Dwyane Dee notified that patient signed AMA for and left the hospital.

## 2022-04-29 NOTE — TOC Progression Note (Signed)
Transition of Care [facility]) - Progression Note    Patient Details  Name: [name] MRN: [id number] Date of Birth: [birth year]  [facility] CM/SW Contact  Beverly Sessions, RN Phone Number: 04/29/2022, 4:24 PM  Clinical Narrative:     Patient had perm cath placed today, and outpatient HD schedule has been arranged Patients mother states that his apartment is not ready, the furniture has not taken out of storage and the carpet has not been put down.   Mother states patient can not stay with him, and that he will not have transport to HD  Housing and transportation resources have been added to AVS  Patient states "don't you worry about where I'm going.  My friend Wille Glaser is going to pick me up, let me stay with him, and take me to HD" Patient is aware of his HD center and time.  Patient is aware that he will not received home health services with out him providing me the address  MD, Bedside RN, toc supervisor and department director updated        Expected Discharge Plan and Services                                               Social Determinants of Health (SDOH) Interventions SDOH Screenings   Food Insecurity: No Food Insecurity (04/24/2022)  Recent Concern: [employer] Present (04/17/2022)  Housing: Medium Risk (04/24/2022)  Transportation Needs: No Transportation Needs (04/24/2022)  Utilities: Not At Risk (04/24/2022)  Alcohol Screen: Low Risk  (03/24/2022)  Depression (PHQ2-9): Low Risk  (04/22/2022)  Recent Concern: Depression (PHQ2-9) - High Risk (04/17/2022)  Tobacco Use: Low Risk  (04/27/2022)    Readmission Risk Interventions     No data to display
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
## 2022-04-30 ENCOUNTER — Encounter: Payer: Self-pay | Admitting: Emergency Medicine

## 2022-04-30 ENCOUNTER — Emergency Department
Admission: EM | Admit: 2022-04-30 | Discharge: 2022-04-30 | Discharge status: Against Medical Advice | Payer: PPO | Attending: Emergency Medicine | Admitting: Emergency Medicine

## 2022-04-30 ENCOUNTER — Encounter: Payer: Self-pay | Admitting: Internal Medicine

## 2022-04-30 DIAGNOSIS — Z5321 Procedure and treatment not carried out due to patient leaving prior to being seen by health care provider: Secondary | ICD-10-CM | POA: Insufficient documentation

## 2022-04-30 DIAGNOSIS — M25552 Pain in left hip: Secondary | ICD-10-CM | POA: Diagnosis not present

## 2022-04-30 DIAGNOSIS — W19XXXA Unspecified fall, initial encounter: Secondary | ICD-10-CM | POA: Diagnosis not present

## 2022-04-30 DIAGNOSIS — R0902 Hypoxemia: Secondary | ICD-10-CM | POA: Diagnosis not present

## 2022-04-30 DIAGNOSIS — J029 Acute pharyngitis, unspecified: Secondary | ICD-10-CM | POA: Diagnosis not present

## 2022-04-30 LAB — GROUP A STREP BY PCR: Group A Strep by PCR: NOT DETECTED
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
## 2022-04-30 NOTE — Consult Note (Addendum)
   [facility] CM Inpatient Consult   04/30/2022  [name] Dec 16, 1965 TV:[id number]  LATE ENTRY: Orientation with Natividad Brood, [facility] Liaison for review.   Location: [facility] Liaison screened remotely([facility])   [facility]) [place] Patient: Insurance (HTA)   Primary Care Provider:  Teodora Medici, DO [facility]  Note: Pt had an ED entry today however did not stay and received treatment based upon the ED triage encounter.  Will updated the involved [facility] RN care coordinator accordingly.    [place] does not replace or interfere with any arrangements made by the Inpatient Transition of Care team.   For questions contact:    Raina Mina, RN, [place] Hours M-F 8:00 am to 5 pm [phone] mobile [phone] [Office toll free line][facility] Office Hours are M-F 8:30 - 5 pm 24 hour nurse advise line [phone] Conceirge  [email or web address]

## 2022-04-30 NOTE — Discharge Summary (Signed)
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 NOT DETECTED Final   Vibrio species NOT DETECTED NOT DETECTED Final   Vibrio cholerae NOT DETECTED NOT DETECTED Final   Enteroaggregative E coli (EAEC) NOT DETECTED NOT DETECTED Final   Enteropathogenic E coli (EPEC) NOT DETECTED NOT DETECTED Final   Enterotoxigenic E coli (ETEC) NOT DETECTED NOT DETECTED Final   Shiga like toxin producing E coli (STEC) NOT DETECTED NOT DETECTED Final   Shigella/Enteroinvasive E coli (EIEC) NOT DETECTED NOT DETECTED Final   Cryptosporidium NOT DETECTED NOT DETECTED Final   Cyclospora cayetanensis NOT DETECTED NOT DETECTED Final   Entamoeba histolytica NOT DETECTED NOT DETECTED Final   Giardia lamblia NOT DETECTED NOT DETECTED Final   Adenovirus F40/41 NOT DETECTED NOT DETECTED Final   Astrovirus NOT DETECTED NOT DETECTED Final   Norovirus GI/GII NOT DETECTED NOT  DETECTED Final   Rotavirus A NOT DETECTED NOT DETECTED Final   Sapovirus (I, II, IV, and V) NOT DETECTED NOT DETECTED Final    Comment: Performed at [facility], [address] [place], [place] [address]  C Difficile Quick Screen w PCR reflex     Status: None   Collection Time: 11/10/21 11:03 PM   Specimen: Stool  Result Value Ref Range Status   C Diff antigen NEGATIVE NEGATIVE Final   C Diff toxin NEGATIVE NEGATIVE Final   C Diff interpretation No C. difficile detected.  Final    Comment: Performed at [facility], [address] [place], [place] [address]  SARS Coronavirus 2 by RT PCR (hospital order, performed in [facility] hospital lab) *cepheid single result test* Anterior Nasal Swab     Status: None   Collection Time: 11/10/21 11:03 PM   Specimen: Anterior Nasal Swab  Result Value Ref Range Status   SARS Coronavirus 2 by RT PCR NEGATIVE NEGATIVE Final    Comment: (NOTE) SARS-CoV-2 target nucleic acids are NOT DETECTED.  The SARS-CoV-2 RNA is generally detectable in upper and lower respiratory specimens during the acute phase of infection. The lowest concentration of SARS-CoV-2 viral copies this assay can detect is 250 copies / mL. A negative result does not preclude SARS-CoV-2 infection and should not be used as the sole basis for treatment or other patient management decisions.  A negative result may occur with improper specimen collection / handling, submission of specimen other than nasopharyngeal swab, presence of viral mutation(s) within the areas targeted by this assay, and inadequate number  of viral copies (<250 copies / mL). A negative result must be combined with clinical observations, patient history, and epidemiological information.  Fact Sheet for Patients:   https://www.patel.info/  Fact Sheet for Healthcare Providers: https://hall.com/  This test is not yet approved or  cleared by the [place] FDA and has been authorized for detection and/or diagnosis of SARS-CoV-2 by FDA under an Emergency Use Authorization (EUA).  This EUA will remain in effect (meaning this test can be used) for the duration of the COVID-19 declaration under Section 564(b)(1) of the Act, 21 U.S.C. section 360bbb-3(b)(1), unless the authorization is terminated or revoked sooner.  Performed at [facility], [address] [place], [place] [address]     Labs: CBC: Recent Labs  Lab 04/23/22 1641 04/24/22 0420 04/26/22 0500 04/27/22 0502 04/27/22 1943 04/28/22 0523 04/29/22 0515  WBC 3.5*   < > 5.7 5.4 4.9 5.5 6.5  NEUTROABS 2.3  --   --   --   --   --   --   HGB 12.6*   < > 11.6* 11.3* 11.4* 11.6* 10.9*  HCT 44.3   < > 40.6 40.6 40.9 41.6 37.3*  MCV 102.1*   < > 101.8* 101.5* 103.0* 103.2* 99.5  PLT 130*   < > 115* 106* 76* 79* 95*   < > = values in this interval not displayed.   Basic Metabolic Panel: Recent Labs  Lab 04/25/22 0321 04/25/22 2239 04/26/22 0500 04/27/22 0502 04/27/22 1930 04/28/22 0523 04/29/22 0515  NA 140   < > 138 138 133* 135 135  K 4.8   < > 4.1 4.3 4.1 4.4 4.2  CL 100   < > 103 103 98 98 102  CO2 29   < > 28 28 28 27 27   GLUCOSE 169*   < > 150* 133* 203* 123* 123*  BUN 92*   < > 76* 88* 62* 67* 80*  CREATININE 4.26*   < > 3.71* 4.14* 3.61* 4.07* 4.51*  CALCIUM 8.6*   < > 8.6* 8.6* 8.4* 8.6* 8.6*  MG 2.4  --  2.1 2.3  --  2.1 2.2  PHOS  --   --   --   --  4.6  --   --    < > = values in this interval not displayed.   Liver Function Tests: Recent Labs  Lab 04/25/22 2239 04/26/22 0500 04/27/22 0502 04/27/22 1930 04/28/22 0523 04/29/22 0515  AST 20 21 16   --  15 16  ALT 20 20 16   --  17 15  ALKPHOS 161* 158* 149*  --  152* 139*  BILITOT 1.0 1.0 1.0  --  0.9 1.2  PROT 6.7 6.8 6.7  --  6.9 6.6  ALBUMIN 2.7* 2.7* 2.8* 2.8* 2.8* 2.6*   CBG: Recent Labs  Lab 04/28/22 1940 04/28/22 2336 04/29/22 0502 04/29/22 1323 04/29/22 1547   GLUCAP 184* 153* 140* 95 87    Discharge time spent: less than 30 minutes.  Signed: Oran Rein, MD [facility] 04/30/2022

## 2022-04-30 NOTE — ED Notes (Signed)
Pt asked a patients family member to roll him outside for a moment. Pt seen getting into a taxi cab without assistance.

## 2022-05-04 ENCOUNTER — Other Ambulatory Visit: Payer: Self-pay | Admitting: Internal Medicine

## 2022-05-04 ENCOUNTER — Ambulatory Visit: Payer: Self-pay

## 2022-05-04 DIAGNOSIS — Z5982 Transportation insecurity: Secondary | ICD-10-CM

## 2022-05-04 NOTE — Telephone Encounter (Signed)
Pt. Calling back and states he cannot come in tomorrow or go to dialysis tomorrow because he does not have transportation. Asking to change appointment to virtual. Cassandra in the practice states pt. Needs to be seen in the office. Pt. States his insurance will not pay for transportation. "Maybe I'll quit going to dialysis." Instructed to call 911 for worsening of symptoms.Please advise pt.

## 2022-05-04 NOTE — Telephone Encounter (Signed)
Care management referral placed to try to help with transportation issues

## 2022-05-04 NOTE — Telephone Encounter (Signed)
  Chief Complaint: Swollen legs to knee, cough Symptoms: above Frequency: ongoing Pertinent Negatives: Patient denies  Disposition: [] ED /[] Urgent Care (no appt availability in office) / [] Appointment(In office/virtual)/ []  [facility]/ [] Home Care/ [x] Refused Recommended Disposition /[] [place] [facility]/ []  Follow-up with PCP Additional Notes: PT has ongoing bilateral leg swelling. Pt has missed dialysis appts. Pt also states that he needs his port checked for infection. PT states that swelling is at or above knee. And he has a cough. Pt refuses OV today. Pt will take OV for tomorrow.   PT states that he will call kidney doctor.    Reason for Disposition  [1] MODERATE leg swelling (e.g., swelling extends up to knees) AND [2] new-onset or worsening  SEVERE leg swelling (e.g., swelling extends above knee, entire leg is swollen, weeping fluid)  Answer Assessment - Initial Assessment Questions 1. ONSET: "When did the swelling start?" (e.g., minutes, hours, days)     2-3 weeks 2. LOCATION: "What part of the leg is swollen?"  "Are both legs swollen or just one leg?"     Both legs 3. SEVERITY: "How bad is the swelling?" (e.g., localized; mild, moderate, severe)   - Localized: Small area of swelling localized to one leg.   - MILD pedal edema: Swelling limited to foot and ankle, pitting edema < 1/4 inch (6 mm) deep, rest and elevation eliminate most or all swelling.   - MODERATE edema: Swelling of lower leg to knee, pitting edema > 1/4 inch (6 mm) deep, rest and elevation only partially reduce swelling.   - SEVERE edema: Swelling extends above knee, facial or hand swelling present.      Up to knees - on to thigh 4. REDNESS: "Does the swelling look red or infected?"      5. PAIN: "Is the swelling painful to touch?" If Yes, ask: "How painful is it?"   (Scale 1-10; mild, moderate or severe)     10/10 6. FEVER: "Do you have a fever?" If Yes, ask: "What is it, how was it measured,  and when did it start?"       7. CAUSE: "What do you think is causing the leg swelling?"     unsure 8. MEDICAL HISTORY: "Do you have a history of blood clots (e.g., DVT), cancer, heart failure, kidney disease, or liver failure?"     kidney 9. RECURRENT SYMPTOM: "Have you had leg swelling before?" If Yes, ask: "When was the last time?" "What happened that time?"     Yes ongoing 10. OTHER SYMPTOMS: "Do you have any other symptoms?" (e.g., chest pain, difficulty breathing)       Cough  Protocols used: Leg Swelling and Edema-A-AH

## 2022-05-05 ENCOUNTER — Ambulatory Visit: Payer: PPO | Admitting: Family Medicine

## 2022-05-05 DIAGNOSIS — N189 Chronic kidney disease, unspecified: Secondary | ICD-10-CM | POA: Diagnosis not present

## 2022-05-05 DIAGNOSIS — N179 Acute kidney failure, unspecified: Secondary | ICD-10-CM | POA: Diagnosis not present

## 2022-05-05 DIAGNOSIS — D509 Iron deficiency anemia, unspecified: Secondary | ICD-10-CM | POA: Diagnosis not present

## 2022-05-08 ENCOUNTER — Telehealth: Payer: Self-pay

## 2022-05-08 ENCOUNTER — Ambulatory Visit (INDEPENDENT_AMBULATORY_CARE_PROVIDER_SITE_OTHER): Payer: PPO | Admitting: Physician Assistant

## 2022-05-08 ENCOUNTER — Encounter: Payer: Self-pay | Admitting: Physician Assistant

## 2022-05-08 ENCOUNTER — Ambulatory Visit: Payer: Self-pay

## 2022-05-08 ENCOUNTER — Ambulatory Visit: Payer: Self-pay | Admitting: *Deleted

## 2022-05-08 VITALS — BP 136/84 | HR 87 | Temp 98.0°F | Resp 16 | Ht 71.0 in

## 2022-05-08 DIAGNOSIS — R234 Changes in skin texture: Secondary | ICD-10-CM

## 2022-05-08 DIAGNOSIS — R238 Other skin changes: Secondary | ICD-10-CM | POA: Diagnosis not present

## 2022-05-08 DIAGNOSIS — R5383 Other fatigue: Secondary | ICD-10-CM | POA: Diagnosis not present

## 2022-05-08 DIAGNOSIS — E1165 Type 2 diabetes mellitus with hyperglycemia: Secondary | ICD-10-CM

## 2022-05-08 DIAGNOSIS — I5022 Chronic systolic (congestive) heart failure: Secondary | ICD-10-CM

## 2022-05-08 NOTE — Patient Outreach (Signed)
  Care Coordination   Follow Up Visit Note   05/08/2022 Name: [name] MRN: [id number] DOB: Oct 02, 1965  [name] is a 57 y.o. year old male who sees Margarita Mail, DO for primary care. I spoke with  [name] by phone today.  What matters to the patients health and wellness today?  Patient report still feeling bloated after having dialysis.  Has HD Tuesday, Thursday, Saturday.  PCP appointment today at 1pm, advised importance of keeping appointment.  State he will try but if he can't, he will go to ED.  Importance of office visits discussed versus ED visits, he verbalized understanding.  Denies any urgent concerns, encouraged to contact this care manager with questions.     Goals Addressed             This Visit's Progress    Effective management of ESRD       Care Coordination Interventions: Assessed the Patient understanding of chronic kidney disease    Evaluation of current treatment plan related to chronic kidney disease self management and patient's adherence to plan as established by provider      Reviewed medications with patient and discussed importance of compliance    Advised patient, providing education and rationale, to monitor blood pressure daily and record, calling PCP for findings outside established parameters    Discussed complications of poorly controlled blood pressure such as heart disease, stroke, circulatory complications, vision complications, kidney impairment, sexual dysfunction    Reviewed scheduled/upcoming provider appointments including    Discussed plans with patient for ongoing care management follow up and provided patient with direct contact information for care management team    Discussed the impact of chronic kidney disease on daily life and mental health and acknowledged and normalized feelings of disempowerment, fear, and frustration    Transportation to appointments discussed with patient, will collaborate with care guide   Discussed current situation of being homeless, will collaborate with CSW for resources Advised importance of daily weights, blood pressure & blood sugar checks, state he is living in a motel and does not have access to his equipment.  State these are monitored at HD sessions.         SDOH assessments and interventions completed:  No     Care Coordination Interventions:  Yes, provided   Interventions Today    Flowsheet Row Most Recent Value  Chronic Disease   Chronic disease during today's visit Chronic Kidney Disease/End Stage Renal Disease (ESRD)  General Interventions   General Interventions Discussed/Reviewed General Interventions Reviewed, Durable Medical Equipment (DME)  Durable Medical Equipment (DME) Glucomoter, Other, BP Cuff  [scale, nebulizer]  Communication with Social Work, PCP/Specialists  [Will suggest CCM referral to PCP]  Exercise Interventions   Exercise Discussed/Reviewed [employer], Exercise Reviewed  Weight Management Weight maintenance  Education Interventions   Education Provided Provided Education  Provided Verbal Education On Nutrition, [employer], When to see the doctor, Medication, Blood Sugar Monitoring, Other  [blood pressure monitoring and daily weights]  Pharmacy Interventions   Pharmacy Dicussed/Reviewed Pharmacy Topics Reviewed, Medications and their functions, Affording Medications        Follow up plan: Referral made to CSW, appointment scheduled for 4/17. Follow up call scheduled for 4/19    Encounter Outcome:  Pt. Visit Completed   Kemper Durie, RN, MSN, [facility] Care Management Care Management Coordinator [phone]

## 2022-05-08 NOTE — Telephone Encounter (Signed)
   CCM RN Visit Note   05/08/22 Name: [name] MRN: [id number]      DOB: September 14, 1965  Subjective: [name] is a 57 y.o. year old male who is a primary care patient of Margarita Mail, DO.   Message received from Care Guide. Per chart review, [name] requires assistance with transportation. Referral placed today for transportation assistance.   PLAN: Will update current RNCM   Katha Cabal RN Care Manager/Chronic Care Management [phone]

## 2022-05-08 NOTE — Patient Instructions (Signed)
Visit Information  Thank you for taking time to visit with me today. Please don't hesitate to contact me if I can be of assistance to you before our next scheduled telephone appointment.  Following are the goals we discussed today:  Listen for call from care guide and social worker. Get machines (nebulizer, blood pressure, blood sugar, and scale) from storage and restart monitoring daily.  Our next appointment is by telephone on 4/19  Please call the care guide team at [phone] if you need to cancel or reschedule your appointment.   Please call the Suicide and Crisis Lifeline: 988 call the [place] National Suicide Prevention Lifeline: 6410542372 or TTY: 606-858-0346 TTY 425-591-2239) to talk to a trained counselor call 1-800-273-TALK (toll free, 24 hour hotline) call 911 if you are experiencing a Mental Health or Behavioral Health Crisis or need someone to talk to.  Patient verbalizes understanding of instructions and care plan provided today and agrees to view in MyChart. Active MyChart status and patient understanding of how to access instructions and care plan via MyChart confirmed with patient.     The patient has been provided with contact information for the care management team and has been advised to call with any health related questions or concerns.   Kemper Durie, RN, MSN, [facility] Care Management Care Management Coordinator [phone]

## 2022-05-08 NOTE — Telephone Encounter (Signed)
  Chief Complaint: Bloated abdomen  Symptoms: above Frequency: Ongoing Pertinent Negatives: Patient denies pain, fever Disposition: [] ED /[] Urgent Care (no appt availability in office) / [x] Appointment(In office/virtual)/ []  [facility]/ [] Home Care/ [] Refused Recommended Disposition /[] [place] [facility]/ []  Follow-up with PCP Additional Notes: Pt reports ongoing abdominal bloating. Pt thought bloating would resolve with dialysis, but it has not.    Summary: Stomach Bloat   Patient states that he is still experiencing a lot of stomach bloat even after starting dialysis. Patient denies having any abdominal pain.  ----- Message from Hennie Duos Mabe sent at 05/08/2022  9:02 AM EDT ----- Patient states that he is still experiencing a lot of stomach bloat even after starting dialysis. Patient denies having any abdominal pain.          Reason for Disposition  [1] MODERATE-SEVERE SWELLING of abdomen (e.g., looks very distended or swollen) AND [2] NEW-onset or much worse  Answer Assessment - Initial Assessment Questions 1. SYMPTOM: "What's the main symptom you're concerned about?" (e.g., abdomen bloating, swelling)     Swelling tight abdomen 2. ONSET: "When did bloating  start?"     1 month 3. SEVERITY: "How bad is the bloating or swelling?"    - BLOATING: Feels gassy or bloated. No visible swelling.     - MILD SWELLING: Feels gassy or bloated. Abdomen looks mildly distended or swollen.    - MODERATE - SEVERE SWELLING: Abdomen looks very distended or swollen.      severe 4. ABDOMEN PAIN:  "Is there any abdomen pain?" If Yes, ask: "How bad is the pain?"  (e.g., Scale 1-10; mild, moderate, or severe)   - NONE (0): No pain.   - MILD (1-3): Doesn't interfere with normal activities, abdomen soft and not tender to touch.    - MODERATE (4-7): Interferes with normal activities or awakens from sleep, abdomen tender to touch.    - SEVERE (8-10): Excruciating pain, doubled over,  unable to do any normal activities.       No pain 5. RELIEVING AND AGGRAVATING FACTORS: "What makes it better or worse?" (e.g., certain foods, lactose, medicines)     no 6. GI HISTORY: "Do you have any history of stomach or intestine problems?" (e.g., bowel obstruction, cancer, irritable bowel)      Dialysis 7. CAUSE: "What do you think is causing the bloating?"      Unsure 8. OTHER SYMPTOMS: "Do you have any other symptoms?" (e.g., belching, blood in stool, breathing difficulty, constipation, diarrhea, fever, passing gas, vomiting, weight loss, white of eyes have turned yellow)     Wheezing  Protocols used: Abdomen Bloating and Swelling-A-AH

## 2022-05-08 NOTE — Progress Notes (Unsigned)
Acute Office Visit   Patient: [name]   DOB: [birth year]   57 y.o. Male  MRN: [id number] Visit Date: 05/08/2022  Today's healthcare provider: Oswaldo Conroy Diedra Sinor, PA-C  Introduced myself to the patient as a Secondary school teacher and provided education on APPs in clinical practice.    Chief Complaint  Patient presents with   Bloated   Subjective    HPI   Patient is here in wheelchair with O2 via cannula provided for office visit due to hypoxia He is lethargic and had to be frequently awoken during apt to continue with HPI, ROS and PE He denies taking anything sedating when asked about his current status He reports "I've been up since 4 AM and I'm just very tired"  His speech is slurred and I had to ask him to repeat himself often during HPI so that I could understand his answers  Bloating  Onset: unsure  Duration: he reports it has been ongoing since he started coming to this clinic,he thinks about a month  He reports his stomach feels hard and bloated on the right side  He denies nausea, vomiting, diarrhea, constipation, blood in stool, fevers, chills, appetite changes, inability to tolerate PO intake,  He reports a normal bowel movement this AM and denies any concerning bowel movements since he noticed bloating He denies changes to appetite or PO Intake stating "I can eat and do everything normal,except my stomach is hard"   He denies use of injectables such as insulin in abdominal area    Medications: Outpatient Medications Prior to Visit  Medication Sig   albuterol (PROVENTIL) (2.5 MG/3ML) 0.083% nebulizer solution Take 3 mLs (2.5 mg total) by nebulization every 6 (six) hours as needed for wheezing or shortness of breath.   albuterol (VENTOLIN HFA) 108 (90 Base) MCG/ACT inhaler Inhale 2 puffs into the lungs every 4 (four) hours as needed for wheezing or shortness of breath.   allopurinol (ZYLOPRIM) 100 MG tablet Take 2 tablets (200 mg total) by mouth daily.   atorvastatin  (LIPITOR) 20 MG tablet Take 1 tablet (20 mg total) by mouth daily.   blood glucose meter kit and supplies KIT Brand of choice; LON 99 months; check FSBS 3x a day; E11.65; disp one meter with 100 strips + 1 refill of strips   Blood Glucose Monitoring Suppl (ONE TOUCH ULTRA 2) w/Device KIT Use to check blood glucose up to 3 x daily   bumetanide (BUMEX) 1 MG tablet Take by mouth. Take 2 tablets (2 mg total) by mouth in the morning and 2 tablets (2 mg total) at noon and 2 tablets (2 mg total) in the evening.   calcitRIOL (ROCALTROL) 0.5 MCG capsule Take 2 capsules (1 mcg total) by mouth 1 (one) time each day   carvedilol (COREG) 25 MG tablet Take 2 tablets (50 mg total) by mouth 2 (two) times daily with a meal.   cyanocobalamin (VITAMIN B12) 1000 MCG tablet Take 1 tablet (1,000 mcg total) by mouth daily.   cyclobenzaprine (FLEXERIL) 10 MG tablet Take 1 tablet (10 mg total) by mouth 3 (three) times daily as needed for muscle spasms.   dicyclomine (BENTYL) 10 MG capsule Take 1 capsule (10 mg total) by mouth 3 (three) times daily before meals.   esomeprazole (NEXIUM) 40 MG capsule Take 1 capsule (40 mg total) by mouth 2 (two) times daily before a meal.   ferrous gluconate (FERGON) 324 MG tablet Take 1 tablet (324  mg total) by mouth daily with breakfast.   fluticasone (FLONASE) 50 MCG/ACT nasal spray Place 2 sprays into both nostrils daily. Use for 4-6 weeks then stop and use seasonally or as needed.   gabapentin (NEURONTIN) 100 MG capsule    hydrocortisone 1 % ointment Apply 1 Application topically 2 (two) times daily.   metolazone (ZAROXOLYN) 5 MG tablet Take 1 tablet (5 mg total) by mouth 1 (one) time each day if needed (for weight gain)   naloxegol oxalate (MOVANTIK) 12.5 MG TABS tablet Take 1 tablet (12.5 mg total) by mouth daily.   OneTouch Delica Lancets 33G MISC Use to check blood sugar 3 x daily   ONETOUCH ULTRA test strip Check blood sugar 3 x daily   oxyCODONE (OXY IR/ROXICODONE) 5 MG immediate  release tablet Take 1 tablet (5 mg total) by mouth every 6 (six) hours as needed for severe pain.   potassium chloride (KLOR-CON) 10 MEQ tablet Take 1 tablet (10 mEq total) by mouth daily.   simethicone (MYLICON) 125 MG chewable tablet Chew 125 mg by mouth every 6 (six) hours as needed for flatulence.   No facility-administered medications prior to visit.    Review of Systems  Constitutional:  Negative for appetite change, chills and fever.  Gastrointestinal:  Positive for abdominal distention. Negative for abdominal pain, blood in stool, constipation, diarrhea, nausea and vomiting.       Objective    BP 136/84   Pulse 87   Temp 98 F (36.7 C) (Oral)   Resp 16   Ht  (1.803 m)   SpO2 99%   BMI 53.22 kg/m    Physical Exam Vitals reviewed.  Constitutional:      Appearance: He is well-developed. He is morbidly obese.     Interventions: Nasal cannula in place.     Comments: Patient was repeatedly falling asleep during responses to HPI questions    HENT:     Head: Normocephalic and atraumatic.  Pulmonary:     Effort: Pulmonary effort is normal.  Abdominal:     General: Abdomen is protuberant. Bowel sounds are normal. There is no distension. There are no signs of injury.     Palpations: Abdomen is soft. There is no shifting dullness, fluid wave or mass.     Tenderness: There is no abdominal tenderness. There is no right CVA tenderness, left CVA tenderness or guarding. Negative signs include Murphy's sign and McBurney's sign.       Comments: Patient's abdomen is soft to palpation in all quadrants  Normal tympany and no shifting fullness, fluid wave or appreciable excess fluid    Neurological:     Mental Status: He is easily aroused. He is lethargic.     Cranial Nerves: No dysarthria or facial asymmetry.     Motor: Motor function is intact.  Psychiatric:        Attention and Perception: He is inattentive.        Mood and Affect: Mood normal. Affect is flat.         Speech: Speech is delayed and slurred.        Behavior: Behavior is slowed. Behavior is cooperative.       No results found for any visits on 05/08/22.  Assessment & Plan      No follow-ups on file.      Problem List Items Addressed This Visit   None Visit Diagnoses     Skin thickening    -  Primary Acute new concern  Patient reports abdomen feeling hard to the touch PE as above- will order CMP, CBC, Lipase and amylase to assist with rule out of hepatic issue or pancreas concern Reviewed most recent labs and imaging from recent hospitalization which did not show acute or chronic abdominal illness Unsure if this is longstanding skin concern such as scarring or leftover from injectable at earlier times There does not appear to be relevant history of surgery that would explain a scar here on my review Lab results to dictate further management Follow up as needed for persistent or progressing symptoms     Other skin changes       Relevant Orders   COMPLETE METABOLIC PANEL WITH GFR   Lipase   Amylase   CBC with Differential/Platelet   Lethargy     Acute recurrent Patient has been seen numerous times in office with severe lethargy and somnolence Will get tox screen today with serum as patient reports he cannot urinate right now.  Results to dictate further management   Relevant Orders   Drug Abuse Panel 8, with Confirmation, Serum        No follow-ups on file.   I, Darlys Buis E Orla Jolliff, PA-C, have reviewed all documentation for this visit. The documentation on 05/08/22 for the exam, diagnosis, procedures, and orders are all accurate and complete.   Jacquelin Hawking, MHS, PA-C [facility]

## 2022-05-11 ENCOUNTER — Emergency Department
Admission: EM | Admit: 2022-05-11 | Discharge: 2022-05-11 | Disposition: A | Discharge status: Home / Self Care | Payer: PPO | Attending: Student in an Organized Health Care Education/Training Program | Admitting: Student in an Organized Health Care Education/Training Program

## 2022-05-11 ENCOUNTER — Emergency Department: Payer: PPO

## 2022-05-11 ENCOUNTER — Ambulatory Visit: Payer: Self-pay | Admitting: *Deleted

## 2022-05-11 ENCOUNTER — Telehealth: Payer: Self-pay | Admitting: *Deleted

## 2022-05-11 ENCOUNTER — Other Ambulatory Visit: Payer: Self-pay

## 2022-05-11 DIAGNOSIS — R0689 Other abnormalities of breathing: Secondary | ICD-10-CM | POA: Diagnosis not present

## 2022-05-11 DIAGNOSIS — R0902 Hypoxemia: Secondary | ICD-10-CM | POA: Diagnosis not present

## 2022-05-11 DIAGNOSIS — Z992 Dependence on renal dialysis: Secondary | ICD-10-CM | POA: Diagnosis not present

## 2022-05-11 DIAGNOSIS — N186 End stage renal disease: Secondary | ICD-10-CM | POA: Insufficient documentation

## 2022-05-11 DIAGNOSIS — I120 Hypertensive chronic kidney disease with stage 5 chronic kidney disease or end stage renal disease: Secondary | ICD-10-CM | POA: Diagnosis not present

## 2022-05-11 DIAGNOSIS — I10 Essential (primary) hypertension: Secondary | ICD-10-CM | POA: Diagnosis not present

## 2022-05-11 DIAGNOSIS — B356 Tinea cruris: Secondary | ICD-10-CM | POA: Insufficient documentation

## 2022-05-11 DIAGNOSIS — J811 Chronic pulmonary edema: Secondary | ICD-10-CM | POA: Diagnosis not present

## 2022-05-11 DIAGNOSIS — R0602 Shortness of breath: Secondary | ICD-10-CM | POA: Diagnosis not present

## 2022-05-11 DIAGNOSIS — R069 Unspecified abnormalities of breathing: Secondary | ICD-10-CM | POA: Diagnosis not present

## 2022-05-11 LAB — CBC WITH DIFFERENTIAL/PLATELET
Abs Immature Granulocytes: 0.02 10*3/uL (ref 0.00–0.07)
Absolute Monocytes: 603 cells/uL (ref 200–950)
Basophils Absolute: 49 cells/uL (ref 0–200)
Basophils Absolute: 0.1 10*3/uL (ref 0.0–0.1)
Basophils Relative: 1 %
Basophils Relative: 1 %
Eosinophils Absolute: 123 cells/uL (ref 15–500)
Eosinophils Absolute: 0.1 10*3/uL (ref 0.0–0.5)
Eosinophils Relative: 2.5 %
Eosinophils Relative: 2 %
HCT: 37.2 % — ABNORMAL LOW (ref 38.5–50.0)
HCT: 38.2 % — ABNORMAL LOW (ref 39.0–52.0)
Hemoglobin: 11.3 g/dL — ABNORMAL LOW (ref 13.2–17.1)
Hemoglobin: 10.8 g/dL — ABNORMAL LOW (ref 13.0–17.0)
Immature Granulocytes: 0 %
Lymphocytes Relative: 20 %
Lymphs Abs: 1083 cells/uL (ref 850–3900)
Lymphs Abs: 1 10*3/uL (ref 0.7–4.0)
MCH: 29.5 pg (ref 27.0–33.0)
MCH: 29.2 pg (ref 26.0–34.0)
MCHC: 30.4 g/dL — ABNORMAL LOW (ref 32.0–36.0)
MCHC: 28.3 g/dL — ABNORMAL LOW (ref 30.0–36.0)
MCV: 97.1 fL (ref 80.0–100.0)
MCV: 103.2 fL — ABNORMAL HIGH (ref 80.0–100.0)
MPV: 9.9 fL (ref 7.5–12.5)
Monocytes Absolute: 0.5 10*3/uL (ref 0.1–1.0)
Monocytes Relative: 12.3 %
Monocytes Relative: 10 %
Neutro Abs: 3043 cells/uL (ref 1500–7800)
Neutro Abs: 3.5 10*3/uL (ref 1.7–7.7)
Neutrophils Relative %: 62.1 %
Neutrophils Relative %: 67 %
Platelets: 129 10*3/uL — ABNORMAL LOW (ref 140–400)
Platelets: 136 10*3/uL — ABNORMAL LOW (ref 150–400)
RBC: 3.83 10*6/uL — ABNORMAL LOW (ref 4.20–5.80)
RBC: 3.7 MIL/uL — ABNORMAL LOW (ref 4.22–5.81)
RDW: 17.8 % — ABNORMAL HIGH (ref 11.0–15.0)
RDW: 20.7 % — ABNORMAL HIGH (ref 11.5–15.5)
Total Lymphocyte: 22.1 %
WBC: 4.9 10*3/uL (ref 3.8–10.8)
WBC: 5.2 10*3/uL (ref 4.0–10.5)
nRBC: 0 % (ref 0.0–0.2)

## 2022-05-11 LAB — COMPREHENSIVE METABOLIC PANEL
ALT: 17 U/L (ref 0–44)
AST: 23 U/L (ref 15–41)
Albumin: 2.8 g/dL — ABNORMAL LOW (ref 3.5–5.0)
Alkaline Phosphatase: 173 U/L — ABNORMAL HIGH (ref 38–126)
Anion gap: 8 (ref 5–15)
BUN: 39 mg/dL — ABNORMAL HIGH (ref 6–20)
CO2: 28 mmol/L (ref 22–32)
Calcium: 8.7 mg/dL — ABNORMAL LOW (ref 8.9–10.3)
Chloride: 105 mmol/L (ref 98–111)
Creatinine, Ser: 2.51 mg/dL — ABNORMAL HIGH (ref 0.61–1.24)
GFR, Estimated: 29 mL/min — ABNORMAL LOW (ref >60)
Glucose, Bld: 135 mg/dL — ABNORMAL HIGH (ref 70–99)
Potassium: 4.1 mmol/L (ref 3.5–5.1)
Sodium: 141 mmol/L (ref 135–145)
Total Bilirubin: 1.6 mg/dL — ABNORMAL HIGH (ref 0.3–1.2)
Total Protein: 6.9 g/dL (ref 6.5–8.1)

## 2022-05-11 LAB — COMPLETE METABOLIC PANEL WITH GFR
AG Ratio: 0.9 (calc) — ABNORMAL LOW (ref 1.0–2.5)
ALT: 12 U/L (ref 9–46)
AST: 13 U/L (ref 10–35)
Albumin: 3.1 g/dL — ABNORMAL LOW (ref 3.6–5.1)
Alkaline phosphatase (APISO): 169 U/L — ABNORMAL HIGH (ref 35–144)
BUN/Creatinine Ratio: 17 (calc) (ref 6–22)
BUN: 47 mg/dL — ABNORMAL HIGH (ref 7–25)
CO2: 33 mmol/L — ABNORMAL HIGH (ref 20–32)
Calcium: 8.6 mg/dL (ref 8.6–10.3)
Chloride: 102 mmol/L (ref 98–110)
Creat: 2.69 mg/dL — ABNORMAL HIGH (ref 0.70–1.30)
Globulin: 3.4 g/dL (calc) (ref 1.9–3.7)
Glucose, Bld: 92 mg/dL (ref 65–99)
Potassium: 4 mmol/L (ref 3.5–5.3)
Sodium: 141 mmol/L (ref 135–146)
Total Bilirubin: 1 mg/dL (ref 0.2–1.2)
Total Protein: 6.5 g/dL (ref 6.1–8.1)
eGFR: 27 mL/min/{1.73_m2} — ABNORMAL LOW (ref >=60)

## 2022-05-11 LAB — DRUG ABUSE PANEL 8, WITH CONFIRMATION, SERUM
Amphetamines: NEGATIVE
Barbiturates: NEGATIVE
Benzodiazepines: NEGATIVE
Cocaine Metabolites: NEGATIVE
Marijuana: NEGATIVE
Methadone: NEGATIVE
Opiates: NEGATIVE
PCP (PHENCYCLIDINE): NEGATIVE

## 2022-05-11 LAB — AMYLASE: Amylase: 113 U/L — ABNORMAL HIGH (ref 21–101)

## 2022-05-11 LAB — LIPASE: Lipase: 49 U/L (ref 7–60)

## 2022-05-11 MED ORDER — KETOCONAZOLE 2 % EX CREA
TOPICAL_CREAM | Freq: Two times a day (BID) | CUTANEOUS | Status: DC
  Filled 2022-05-11: qty 15

## 2022-05-11 NOTE — Telephone Encounter (Signed)
   Telephone encounter was:  Successful.  05/11/2022 Name: [name] MRN: [id number] DOB: 10-11-1965  [name] is a 57 y.o. year old male who is a primary care patient of Margarita Mail, DO . The community resource team was consulted for assistance with Transportation Needs   Care guide performed the following interventions: Patient provided with information about care guide support team and interviewed to confirm resource needs. Patient called needing dialysis treatment . [facility] will transport however there is a waitlist patient will be able to use [facility] transport for dr appts but will need to wait availability for [facility] dialysis transport  Follow Up Plan:  Care guide will follow up with patient by phone over the next day  Talib Headley Greenauer -[facility], Population Health [phone] [address] [place] , [place] [address] Email : [email or web address]

## 2022-05-11 NOTE — ED Triage Notes (Signed)
Pt BIB [facility] from home for [facility] and a wound on R foot. Per EMS patient's home was 85 degrees and pt felt better when he got outside. 90% on room air. 100% with 4L O2. Dialysis T TH S.  145/102 HR 80

## 2022-05-11 NOTE — Telephone Encounter (Signed)
Message from Valora Piccolo sent at 05/11/2022  8:17 AM EDT  Summary: Congestion Advice   Pt is calling to speak to the nurse- reporting congestion with sinus infection. Offered appt today with Henry Russel. Pt declines and state he wil go to the ED. Please advie          Call History   Type Contact Phone/Fax User  05/11/2022 08:16 AM EDT Phone (Incoming) [place], Belia Heman (Self) [phone] Rexene Edison) Jens Som A   Reason for Disposition  [1] Sinus pain (not just congestion) AND [2] fever    Refused in office appt.   Prefers to go to the ED.  Protocols used: Sinus Pain or Congestion-A-AH

## 2022-05-11 NOTE — Telephone Encounter (Signed)
   Telephone encounter was:  Unsuccessful.  05/11/2022 Name: [name] MRN: [id number] DOB: [birth year]  Unsuccessful outbound call made today to assist with:  Transportation Needs   Outreach Attempt:  1st Attempt  A HIPAA compliant voice message was left requesting a return call.  Instructed patient to call back at [phone]. Yehuda Mao Greenauer -[facility], Population Health [phone] [address] [place] , [place] [address] Email : [email or web address]
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
## 2022-05-11 NOTE — Telephone Encounter (Signed)
  Chief Complaint: sinus congestion.   I called him back because note sent that he was going to the ED for sinus congestion even though there are appts available at [facility] today.  He refused an appt and prefers to go to the ED.  His mother is going to take him. Symptoms: sinus congestion.   Then started mentioning several complaints, dialysis port hurting, sore on the bottom of his foot, temperature being too hot. Frequency: this morning Pertinent Negatives: Patient denies N/A Disposition: [x] ED /[] Urgent Care (no appt availability in office) / [x] Appointment(In office/virtual)/ []  [facility]/ [] Home Care/ [] Refused Recommended Disposition /[]  Mobile Bus/ []  Follow-up with PCP Additional Notes: Pt refuses appt. Even though there are several appts available today at [facility]   Prefers to go to the ED.    I sent this information to [facility] for their information.

## 2022-05-12 ENCOUNTER — Telehealth (INDEPENDENT_AMBULATORY_CARE_PROVIDER_SITE_OTHER): Payer: Self-pay

## 2022-05-12 DIAGNOSIS — R0902 Hypoxemia: Secondary | ICD-10-CM | POA: Diagnosis not present

## 2022-05-12 DIAGNOSIS — R0689 Other abnormalities of breathing: Secondary | ICD-10-CM | POA: Diagnosis not present

## 2022-05-12 NOTE — Telephone Encounter (Signed)
Patient called back only to let me know he will call when he ready to be scheduled for his surgery.

## 2022-05-12 NOTE — Telephone Encounter (Signed)
I attempted to contact the patient to schedule him for a left brachiocephalic fistula with Dr. Wyn Quaker as the patient has not called back after saying he would on 04/13/22. A message has been left for a return call.

## 2022-05-13 ENCOUNTER — Emergency Department: Payer: PPO

## 2022-05-13 ENCOUNTER — Other Ambulatory Visit: Payer: Self-pay

## 2022-05-13 ENCOUNTER — Emergency Department
Admission: EM | Admit: 2022-05-13 | Discharge: 2022-05-13 | Discharge status: Against Medical Advice | Payer: PPO | Attending: Emergency Medicine | Admitting: Emergency Medicine

## 2022-05-13 ENCOUNTER — Ambulatory Visit: Payer: Self-pay

## 2022-05-13 ENCOUNTER — Ambulatory Visit: Payer: Self-pay | Admitting: *Deleted

## 2022-05-13 DIAGNOSIS — I509 Heart failure, unspecified: Secondary | ICD-10-CM | POA: Insufficient documentation

## 2022-05-13 DIAGNOSIS — R0602 Shortness of breath: Secondary | ICD-10-CM | POA: Insufficient documentation

## 2022-05-13 DIAGNOSIS — Z5321 Procedure and treatment not carried out due to patient leaving prior to being seen by health care provider: Secondary | ICD-10-CM | POA: Insufficient documentation

## 2022-05-13 DIAGNOSIS — J811 Chronic pulmonary edema: Secondary | ICD-10-CM | POA: Diagnosis not present

## 2022-05-13 DIAGNOSIS — Z992 Dependence on renal dialysis: Secondary | ICD-10-CM | POA: Insufficient documentation

## 2022-05-13 NOTE — Telephone Encounter (Signed)
  Pt is requesting a call back from a nurse. He declined to provide further details and stated that he wanted to speak with the nurse.      Left message to call back.

## 2022-05-13 NOTE — Patient Outreach (Signed)
  Care Coordination   Initial Visit Note   05/13/2022 Name: [name] MRN: [id number] DOB: 1965-03-27  [name] is a 57 y.o. year old male who sees Margarita Mail, DO for primary care. I spoke with  [name] by phone today.  What matters to the patients health and wellness today?  Patient requesting housing resources, he is currently residing with his mother, however states that the thermostat stays over 80 degrees. Patient confirms that he has a fan, does not want to consider a AC window unit.  Consent provided to speak to patient's mother. She is not willing to compromise on the temperature due to her own health issues. Patient encouraged to follow up up the [employer] option as well as the [employer].    Goals Addressed             This Visit's Progress    Housing resources       Care Coordination Interventions: Housing options discussed- [facility] declined due to location, per patient [employer] [employer] [facility] contacted-1 bed open patient agreeable to call [phone] Patient will follow up with [employer]           SDOH assessments and interventions completed:  Yes  SDOH Interventions Today    Flowsheet Row Most Recent Value  SDOH Interventions   Food Insecurity Interventions Intervention Not Indicated  Transportation Interventions Intervention Not Indicated  [Mother assists with cab rides to dialysis and medical appointments]        Care Coordination Interventions:  Yes, provided  Interventions Today    Flowsheet Row Most Recent Value  Chronic Disease   Chronic disease during today's visit Chronic Kidney Disease/End Stage Renal Disease (ESRD)  General Interventions   General Interventions Discussed/Reviewed Communication with, Merchant navy officer Resources discussed -[employer], [employer], [employer] [facility](patient on waiting  list)]  Communication with --  [employer]-confirmed that patient is not eligible for [facility] but is in the [place] transit area-application will need to be completed and mailed to [employer]]       Follow up plan: Follow up call scheduled for 05/27/22    Encounter Outcome:  Pt. Visit Completed

## 2022-05-13 NOTE — ED Notes (Signed)
Pt dtr here to pick pt up at this time. Pt stated, "I will try back again later." Misty Stanley, RN made aware that pt left.

## 2022-05-13 NOTE — Telephone Encounter (Signed)
Pt called and instructed to contact kidney provider
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
## 2022-05-13 NOTE — Telephone Encounter (Signed)
  Chief Complaint: Results Symptoms: NA Frequency: NA Pertinent Negatives: Patient denies NA Disposition: ED /[] Urgent Care (no appt availability in office) / Appointment(In office/virtual)/  [facility]/ Home Care/ Refused Recommended Disposition /[] [facility]/  Follow-up with PCP Additional Notes:  Pt calling for lab results from ED yesterday, not resulted. Pt states "Something wrong with my port and I was dehydrated." Pt arguing during call with someone, difficult to redirect. States "They told me I need rehab but my situation here is defeating the purpose; no sleep, port is dirty." Pt continues to yell and argue with person present in home. States "I'll wait until appt." Has tele appt  today.

## 2022-05-13 NOTE — ED Notes (Signed)
Pt refusing to have blood work drawn at this time. Pt stated, "I just had blood work done at dialysis. Nothing has changed over night." Pt made aware of the long wait time and that having his blood drawn could change his wait time. Pt asking for food at this time. Pt informed that he shouldn't eat or drink anything until he sees a provider. Pt stated, "I will stay until 3am or 4am and then I am going to call my daughter to pick me up and bring me back."

## 2022-05-13 NOTE — Telephone Encounter (Signed)
Copied from CRM [id number]. Topic: General - Other >> May 13, 2022  2:00 PM Dominique A wrote: Reason for CRM: Pt is calling wanting to speak with his PCP regarding draining the fluid on his stomach. Please have PCP call pt back.

## 2022-05-13 NOTE — Patient Instructions (Signed)
Visit Information  Thank you for taking time to visit with me today. Please don't hesitate to contact me if I can be of assistance to you.   Following are the goals we discussed today:   Goals Addressed             This Visit's Progress    Housing resources       Care Coordination Interventions: Housing options discussed-[facility] declined due to location, per patient [employer] contacted-1 bed open patient agreeable to call [phone] Patient will follow up with [facility]           Our next appointment is by telephone on 05/27/22 at 2pm  Please call the care guide team at [phone] if you need to cancel or reschedule your appointment.   If you are experiencing a Mental Health or Behavioral Health Crisis or need someone to talk to, please call 911   Patient verbalizes understanding of instructions and care plan provided today and agrees to view in MyChart. Active MyChart status and patient understanding of how to access instructions and care plan via MyChart confirmed with patient.     Telephone follow up appointment with care management team member scheduled for: 05/27/22  Verna Czech, LCSW Clinical Social Worker  [facility] [phone]

## 2022-05-14 ENCOUNTER — Ambulatory Visit: Payer: Self-pay

## 2022-05-14 ENCOUNTER — Encounter: Payer: Self-pay | Admitting: Nurse Practitioner

## 2022-05-14 ENCOUNTER — Telehealth (INDEPENDENT_AMBULATORY_CARE_PROVIDER_SITE_OTHER): Payer: PPO | Admitting: Nurse Practitioner

## 2022-05-14 DIAGNOSIS — F331 Major depressive disorder, recurrent, moderate: Secondary | ICD-10-CM | POA: Diagnosis not present

## 2022-05-14 MED ORDER — FLUOXETINE HCL 10 MG PO CAPS
10.0000 mg | ORAL_CAPSULE | Freq: Every day | ORAL | 0 refills | Status: DC
Start: 2022-05-14 — End: 2022-09-15

## 2022-05-14 NOTE — Telephone Encounter (Signed)
Pt has been dismissed from the practice a letter was sent to him dated 4.4.2024

## 2022-05-14 NOTE — Telephone Encounter (Signed)
  Chief Complaint: Pt requesting medication for depression. Symptoms:  Frequency:  Pertinent Negatives: Patient denies  Disposition: ED /[] Urgent Care (no appt availability in office) / Appointment(In office/virtual)/  [facility]/ Home Care/ Refused Recommended Disposition /[] [facility]/  Follow-up with PCP Additional Notes: Pt called requesting depression medication. Pt states that his medical conditions are worrying him. There is a lot going on with his health, and the lifestyle changes needed are too big. He also states that he is compliant with those changes. In the background of the phone call states that he is not compliant, and that he call the EMS to come get him in the middle of the night.  PT states that he goes to dialysis but is often not treated d/t some issue or another.     Reason for Disposition  Symptoms interfere with work or school  Answer Assessment - Initial Assessment Questions 1. CONCERN: "What happened that made you call today?"     Does not want to think about health issues 2. DEPRESSION SYMPTOM SCREENING: "How are you feeling overall?" (e.g., decreased energy, increased sleeping or difficulty sleeping, difficulty concentrating, feelings of sadness, guilt, hopelessness, or worthlessness)      3. RISK OF HARM - SUICIDAL IDEATION:  "Do you ever have thoughts of hurting or killing yourself?"  (e.g., yes, no, no but preoccupation with thoughts about death)   - INTENT:  "Do you have thoughts of hurting or killing yourself right NOW?" (e.g., yes, no, N/A)   - PLAN: "Do you have a specific plan for how you would do this?" (e.g., gun, knife, overdose, no plan, N/A)     no 4. RISK OF HARM - HOMICIDAL IDEATION:  "Do you ever have thoughts of hurting or killing someone else?"  (e.g., yes, no, no but preoccupation with thoughts about death)   - INTENT:  "Do you have thoughts of hurting or killing someone right NOW?" (e.g., yes, no, N/A)   -  PLAN: "Do you have a specific plan for how you would do this?" (e.g., gun, knife, no plan, N/A)      no 5. FUNCTIONAL IMPAIRMENT: "How have things been going for you overall? Have you had more difficulty than usual doing your normal daily activities?"  (e.g., better, same, worse; self-care, school, work, interactions)      6. SUPPORT: "Who is with you now?" "Who do you live with?" "Do you have family or friends who you can talk to?"       7. THERAPIST: "Do you have a counselor or therapist? Name?"      8. STRESSORS: "Has there been any new stress or recent changes in your life?"      9. ALCOHOL USE OR SUBSTANCE USE (DRUG USE): "Do you drink alcohol or use any illegal drugs?"      10. OTHER: "Do you have any other physical symptoms right now?" (e.g., fever)  Protocols used: Depression-A-AH

## 2022-05-14 NOTE — Assessment & Plan Note (Signed)
Start prozac, follow up with new provider in 4 weeks. Contact your insurance about local counselors in your area for therapy.

## 2022-05-14 NOTE — Progress Notes (Signed)
Name: [name]   MRN: [id number]    DOB: Sep 16, 1965   Date:05/14/2022       Progress Note  Subjective  Chief Complaint  Chief Complaint  Patient presents with   Depression    I connected with  [name]  on 05/14/22 at 11:00 AM EDT by a video enabled telemedicine application and verified that I am speaking with the correct person using two identifiers.  I discussed the limitations of evaluation and management by telemedicine and the availability of in person appointments. The patient expressed understanding and agreed to proceed with a virtual visit  Staff also discussed with the patient that there may be a patient responsible charge related to this service. Patient Location: home Provider Location: [facility] Additional Individuals present: alone  HPI  Depression: His pHQ9 and GAD scores are positive. he was previously prescribed prozac but he never took it.  He says he is ready to take it now.  He says he is really stressed about his health.  Will resend prescription.  He is aware that he needs to schedule a follow up appointment with his new provider for refills of this medication.  Contact your insurance about local counselors in your area for therapy.      05/14/2022   10:50 AM 05/08/2022    1:07 PM 04/22/2022    9:47 AM 04/17/2022    9:44 AM 04/10/2022   11:07 AM  Depression screen PHQ 2/9  Decreased Interest 1 0 0 3 0  Down, Depressed, Hopeless 2 0 0 3 0  PHQ - 2 Score 3 0 0 6 0  Altered sleeping 1  0 2 0  Tired, decreased energy 1  0 1 0  Change in appetite 1  0 0 0  Feeling bad or failure about yourself  2  0 2 0  Trouble concentrating 1  0 1 0  Moving slowly or fidgety/restless 3  0 1 0  Suicidal thoughts 1  0  0  PHQ-9 Score 13  0 13 0  Difficult doing work/chores Very difficult  Not difficult at all Somewhat difficult Not difficult at all    Patient Active Problem List   Diagnosis Date Noted   Hypercapnia 04/24/2022   Stress reaction causing mixed  disturbance of emotion and conduct 04/24/2022   AMS (altered mental status) 04/23/2022   Cellulitis of right lower extremity 04/20/2022   Hypoxia 03/02/2022   Anemia in stage 4 chronic kidney disease 12/15/2021   Back pain 12/15/2021   Macrocytic anemia 12/15/2021   Laceration of left lower extremity 11/24/2021   Pressure injury of skin 11/12/2021   Hypotension 11/11/2021   Hyponatremia 11/11/2021   Thrombocytopenia 11/11/2021   Acute kidney injury superimposed on CKD 11/10/2021   Diarrhea 11/10/2021   Volume overload 11/10/2021   Congestive heart failure 08/19/2020   Background diabetic retinopathy 10/17/2018   Chronic pain syndrome 01/21/2017   Chronic restrictive lung disease 02/19/2016   Primary osteoarthritis involving multiple joints 01/24/2016   Asthma 09/03/2015   Elevated transaminase level 06/04/2015   Excessive daytime sleepiness 05/07/2015   GERD (gastroesophageal reflux disease) 05/07/2015   Noncompliance 04/09/2015   Allergic rhinitis 04/09/2015   Hyperlipidemia associated with type 2 diabetes mellitus 02/15/2015   Vitamin D deficiency 01/17/2015   Elevated CO2 level 01/17/2015   Tobacco abuse, episodic 01/17/2015   Insomnia 01/17/2015   Current tobacco use 01/11/2015   Benign hypertension with CKD (chronic kidney disease) stage IV (HCC) 07/10/2014  Proteinuria 07/10/2014   Type 2 diabetes mellitus, controlled, with renal complications 07/10/2014   CKD (chronic kidney disease), stage IV 07/10/2014   Secondary renal hyperparathyroidism (HCC) 07/10/2014   Cardiomyopathy, dilated, nonischemic (HCC)    Morbid obesity with BMI of 50.0-59.9, adult    Gout    Chronic systolic heart failure 04/29/2011    Social History   Tobacco Use   Smoking status: Never   Smokeless tobacco: Never  Substance Use Topics   Alcohol use: Yes    Comment: a glass of wine and a couple beers a day     Current Outpatient Medications:    FLUoxetine (PROZAC) 10 MG capsule, Take 1  capsule (10 mg total) by mouth daily., Disp: 30 capsule, Rfl: 0   albuterol (PROVENTIL) (2.5 MG/3ML) 0.083% nebulizer solution, Take 3 mLs (2.5 mg total) by nebulization every 6 (six) hours as needed for wheezing or shortness of breath., Disp: 150 mL, Rfl: 3   albuterol (VENTOLIN HFA) 108 (90 Base) MCG/ACT inhaler, Inhale 2 puffs into the lungs every 4 (four) hours as needed for wheezing or shortness of breath., Disp: 18 g, Rfl: 3   allopurinol (ZYLOPRIM) 100 MG tablet, Take 2 tablets (200 mg total) by mouth daily., Disp: 180 tablet, Rfl: 0   atorvastatin (LIPITOR) 20 MG tablet, Take 1 tablet (20 mg total) by mouth daily., Disp: 90 tablet, Rfl: 3   blood glucose meter kit and supplies KIT, Brand of choice; LON 99 months; check FSBS 3x a day; E11.65; disp one meter with 100 strips + 1 refill of strips, Disp: 1 each, Rfl: 0   Blood Glucose Monitoring Suppl (ONE TOUCH ULTRA 2) w/Device KIT, Use to check blood glucose up to 3 x daily, Disp: 1 kit, Rfl: 0   bumetanide (BUMEX) 1 MG tablet, Take by mouth. Take 2 tablets (2 mg total) by mouth in the morning and 2 tablets (2 mg total) at noon and 2 tablets (2 mg total) in the evening., Disp: , Rfl:    calcitRIOL (ROCALTROL) 0.5 MCG capsule, Take 2 capsules (1 mcg total) by mouth 1 (one) time each day, Disp: , Rfl:    carvedilol (COREG) 25 MG tablet, Take 2 tablets (50 mg total) by mouth 2 (two) times daily with a meal., Disp: 360 tablet, Rfl: 1   cyanocobalamin (VITAMIN B12) 1000 MCG tablet, Take 1 tablet (1,000 mcg total) by mouth daily., Disp: 90 tablet, Rfl: 1   cyclobenzaprine (FLEXERIL) 10 MG tablet, Take 1 tablet (10 mg total) by mouth 3 (three) times daily as needed for muscle spasms., Disp: 60 tablet, Rfl: 2   dicyclomine (BENTYL) 10 MG capsule, Take 1 capsule (10 mg total) by mouth 3 (three) times daily before meals., Disp: 90 capsule, Rfl: 0   esomeprazole (NEXIUM) 40 MG capsule, Take 1 capsule (40 mg total) by mouth 2 (two) times daily before a meal.,  Disp: 180 capsule, Rfl: 0   ferrous gluconate (FERGON) 324 MG tablet, Take 1 tablet (324 mg total) by mouth daily with breakfast., Disp: 100 tablet, Rfl: 0   fluticasone (FLONASE) 50 MCG/ACT nasal spray, Place 2 sprays into both nostrils daily. Use for 4-6 weeks then stop and use seasonally or as needed., Disp: 16 g, Rfl: 3   gabapentin (NEURONTIN) 100 MG capsule, , Disp: , Rfl:    hydrocortisone 1 % ointment, Apply 1 Application topically 2 (two) times daily., Disp: 30 g, Rfl: 0   metolazone (ZAROXOLYN) 5 MG tablet, Take 1 tablet (5 mg total)  by mouth 1 (one) time each day if needed (for weight gain), Disp: , Rfl:    naloxegol oxalate (MOVANTIK) 12.5 MG TABS tablet, Take 1 tablet (12.5 mg total) by mouth daily., Disp: 30 tablet, Rfl: 0   OneTouch Delica Lancets 33G MISC, Use to check blood sugar 3 x daily, Disp: 300 each, Rfl: 3   ONETOUCH ULTRA test strip, Check blood sugar 3 x daily, Disp: 300 each, Rfl: 3   oxyCODONE (OXY IR/ROXICODONE) 5 MG immediate release tablet, Take 1 tablet (5 mg total) by mouth every 6 (six) hours as needed for severe pain., Disp: 20 tablet, Rfl: 0   potassium chloride (KLOR-CON) 10 MEQ tablet, Take 1 tablet (10 mEq total) by mouth daily., Disp: 30 tablet, Rfl: 2   simethicone (MYLICON) 125 MG chewable tablet, Chew 125 mg by mouth every 6 (six) hours as needed for flatulence., Disp: , Rfl:   Allergies  Allergen Reactions   Talc     Nose bleeds    I personally reviewed active problem list, medication list, allergies, notes from last encounter with the patient/caregiver today.  ROS  Constitutional: Negative for fever or weight change.  Respiratory: Negative for cough and shortness of breath.   Cardiovascular: Negative for chest pain or palpitations.  Gastrointestinal: Negative for abdominal pain, no bowel changes.  Musculoskeletal: Negative for gait problem or joint swelling.  Skin: Negative for rash.  Neurological: Negative for dizziness or headache.  No  other specific complaints in a complete review of systems (except as listed in HPI above).   Objective  Virtual encounter, vitals not obtained.  There is no height or weight on file to calculate BMI.  Nursing Note and Vital Signs reviewed.  Physical Exam  Awake, alert and oriented, speaking in complete sentences  No results found for this or any previous visit (from the past 72 hour(s)).   Assessment & Plan  Problem List Items Addressed This Visit   None Visit Diagnoses     Moderate episode of recurrent major depressive disorder    -  Primary   Relevant Medications   FLUoxetine (PROZAC) 10 MG capsule        -Red flags and when to present for emergency care or RTC including fever >101.73F, chest pain, shortness of breath, new/worsening/un-resolving symptoms,  reviewed with patient at time of visit. Follow up and care instructions discussed and provided in AVS. - I discussed the assessment and treatment plan with the patient. The patient was provided an opportunity to ask questions and all were answered. The patient agreed with the plan and demonstrated an understanding of the instructions.  I provided 15 minutes of non-face-to-face time during this encounter.  Berniece Salines, FNP

## 2022-05-15 ENCOUNTER — Ambulatory Visit: Payer: Self-pay | Admitting: *Deleted

## 2022-05-15 ENCOUNTER — Emergency Department
Admission: EM | Admit: 2022-05-15 | Discharge: 2022-05-15 | Disposition: A | Discharge status: Home / Self Care | Payer: PPO | Attending: Emergency Medicine | Admitting: Emergency Medicine

## 2022-05-15 ENCOUNTER — Other Ambulatory Visit: Payer: Self-pay

## 2022-05-15 ENCOUNTER — Ambulatory Visit: Payer: Self-pay

## 2022-05-15 DIAGNOSIS — T82898A Other specified complication of vascular prosthetic devices, implants and grafts, initial encounter: Secondary | ICD-10-CM | POA: Diagnosis not present

## 2022-05-15 DIAGNOSIS — Y802 Prosthetic and other implants, materials and accessory physical medicine devices associated with adverse incidents: Secondary | ICD-10-CM | POA: Diagnosis not present

## 2022-05-15 DIAGNOSIS — J45909 Unspecified asthma, uncomplicated: Secondary | ICD-10-CM | POA: Diagnosis not present

## 2022-05-15 DIAGNOSIS — Z789 Other specified health status: Secondary | ICD-10-CM

## 2022-05-15 DIAGNOSIS — I10 Essential (primary) hypertension: Secondary | ICD-10-CM | POA: Diagnosis not present

## 2022-05-15 DIAGNOSIS — Z452 Encounter for adjustment and management of vascular access device: Secondary | ICD-10-CM | POA: Diagnosis not present

## 2022-05-15 DIAGNOSIS — Z992 Dependence on renal dialysis: Secondary | ICD-10-CM | POA: Diagnosis not present

## 2022-05-15 DIAGNOSIS — T82598A Other mechanical complication of other cardiac and vascular devices and implants, initial encounter: Secondary | ICD-10-CM | POA: Diagnosis not present

## 2022-05-15 DIAGNOSIS — I509 Heart failure, unspecified: Secondary | ICD-10-CM | POA: Diagnosis not present

## 2022-05-15 DIAGNOSIS — E1122 Type 2 diabetes mellitus with diabetic chronic kidney disease: Secondary | ICD-10-CM | POA: Diagnosis not present

## 2022-05-15 DIAGNOSIS — R0902 Hypoxemia: Secondary | ICD-10-CM | POA: Diagnosis not present

## 2022-05-15 DIAGNOSIS — N186 End stage renal disease: Secondary | ICD-10-CM | POA: Diagnosis not present

## 2022-05-15 NOTE — ED Provider Notes (Signed)
[facility] Provider Note    Event Date/Time   First MD Initiated Contact with Patient 05/15/22 1139     (approximate)   History   Vascular Access Problem   HPI  [name] is a 57 y.o. male with a history of type 2 diabetes, asthma, anemia, CHF, and ESRD on dialysis and as listed in EMR presents to the emergency department for treatment and evaluation due to concern that his dialysis catheter may have gotten pulled while he was asleep last night.       Physical Exam   Triage Vital Signs: ED Triage Vitals  Enc Vitals Group     BP 05/15/22 1118 (!) 155/106     Pulse Rate 05/15/22 1118 75     Resp 05/15/22 1118 16     Temp 05/15/22 1118 98.5 F (36.9 C)     Temp Source 05/15/22 1118 Oral     SpO2 05/15/22 1150 95 %     Weight --      Height --      Head Circumference --      Peak Flow --      Pain Score 05/15/22 1130 0     Pain Loc --      Pain Edu? --      Excl. in GC? --     Most recent vital signs: Vitals:   05/15/22 1150 05/15/22 1255  BP:  (!) 181/105  Pulse:  72  Resp:  16  Temp:    SpO2: 95% 95%    General: Awake, no distress.  CV:  Good peripheral perfusion.  Resp:  Normal effort.  Abd:  No distention.  Other:  Site of dialysis catheter at right is clean and dry. No active bleeding. Sutures remain intact.    ED Results / Procedures / Treatments   Labs (all labs ordered are listed, but only abnormal results are displayed) Labs Reviewed - No data to display   EKG  Not indicated.   RADIOLOGY  Image and radiology report reviewed and interpreted by me. Radiology report consistent with the same.  Not indicated.  PROCEDURES:  Critical Care performed: No  Procedures   MEDICATIONS ORDERED IN ED:  Medications - No data to display   IMPRESSION / MDM / ASSESSMENT AND PLAN / ED COURSE   I have reviewed the triage note.  Differential diagnosis includes, but is not limited to, Permacath displacement,  feared complaint without diagnosis  Patient's presentation is most consistent with acute, uncomplicated illness.  57 year old male presents to the emergency department for evaluation of his permacath site.  The area was itching this morning and he was afraid maybe he had accidentally pulled it during the night.  On exam, there is no indication that the primary cath has moved.  Sutures are intact and the dressing is clean and dry.  Also of note, the patient is complaining about his living situation.  He feels that he and his mother will not be able to live in the same house.  This was also the reason for his visit today.  I advised him that he will need to speak with social work if he needs assistance in finding a different residence.      FINAL CLINICAL IMPRESSION(S) / ED DIAGNOSES   Final diagnoses:  Problem with vascular access     Rx / DC Orders   ED Discharge Orders     None        Note:  This document was prepared using Dragon voice recognition software and may include unintentional dictation errors.   Chinita Pester, FNP 05/15/22 1532    Sharman Cheek, MD 05/16/22 1003

## 2022-05-15 NOTE — ED Triage Notes (Signed)
Pt to ED via POV from home. Pt reports he may have pulled some of his dialysis chest port out while sleeping. PT also reports has been having to stay with mother who keeps the house to hot. Pt denies pain. Pt T/TH/S. Pt reports last tx was yesterday with no issue.

## 2022-05-15 NOTE — ED Notes (Signed)
First Nurse Note: Pt to ED via [facility] from home because he "almost pulled out his dialysis port last night. Per EMS site is not bleeding and is not dislodged. Pt is scheduled for dialysis tomorrow. Pt is in NAD.

## 2022-05-15 NOTE — Patient Outreach (Signed)
  Care Coordination   Follow Up Visit Note   05/15/2022 Name: [name] MRN: [id number] DOB: [birth year]  [name] is a 57 y.o. year old male who sees Margarita Mail, DO for primary care. I spoke with  Darren Heman Henry by phone today.  What matters to the patients health and wellness today?  Getting transportation to new PCP appointment and continuing dialysis.  State he was told that he may not need treatments after 8 weeks of compliant sessions.   He's had 3 ED visits since last outreach on 4/12, currently in the ED now.  He was discharged from previous PCP office due to non-compliance.  State he is working to be more compliant as he does not want long term HD.    Goals Addressed             This Visit's Progress    Effective management of ESRD   On track    Care Coordination Interventions: Assessed the Patient understanding of chronic kidney disease    Evaluation of current treatment plan related to chronic kidney disease self management and patient's adherence to plan as established by provider      Reviewed medications with patient and discussed importance of compliance    Advised patient, providing education and rationale, to monitor blood pressure daily and record, calling PCP for findings outside established parameters    Discussed complications of poorly controlled blood pressure such as heart disease, stroke, circulatory complications, vision complications, kidney impairment, sexual dysfunction    Reviewed scheduled/upcoming provider appointments including    Discussed plans with patient for ongoing care management follow up and provided patient with direct contact information for care management team    Discussed the impact of chronic kidney disease on daily life and mental health and acknowledged and normalized feelings of disempowerment, fear, and frustration    Transportation to appointments discussed with patient, will collaborate with care guide   Discussed current situation of being homeless, will collaborate with CSW for resources Advised importance of daily weights, blood pressure & blood sugar checks, State these are monitored at HD sessions.         SDOH assessments and interventions completed:  No     Care Coordination Interventions:  Yes, provided   Interventions Today    Flowsheet Row Most Recent Value  Chronic Disease   Chronic disease during today's visit Chronic Kidney Disease/End Stage Renal Disease (ESRD)  General Interventions   General Interventions Discussed/Reviewed Doctor Visits, Communication with  Doctor Visits Discussed/Reviewed Doctor Visits Reviewed, PCP, Specialist  [Nephrology appt on 5/6, new PCP on 515 @ 130]  Durable Medical Equipment (DME) Glucomoter, BP Cuff  Communication with PCP/Specialists  [New PCP office to confirm appt date.  Collaborated wtih care guide for transportation]  Exercise Interventions   Exercise Discussed/Reviewed Weight Managment  Weight Management Weight maintenance  Education Interventions   Education Provided Provided Education  Provided Verbal Education On Blood Sugar Monitoring, Nutrition, When to see the doctor, Medication, Community Resources       Follow up plan: Follow up call scheduled for 5/17    Encounter Outcome:  Pt. Visit Completed   Kemper Durie, RN, MSN, [facility] Care Management Care Management Coordinator [phone]

## 2022-05-15 NOTE — Telephone Encounter (Signed)
     Chief Complaint: Pt. States he "pulled part of my dialysis port out last night while I was sleeping.I called dialysis and they told me to go to ED." Symptoms: Has pain at site, right chest. Frequency: Last night Pertinent Negatives: Patient denies  Disposition: ED /[] Urgent Care (no appt availability in office) / Appointment(In office/virtual)/  [facility]/ Home Care/ Refused Recommended Disposition /[] [facility]/  Follow-up with PCP Additional Notes: Pt. States he will call EMS to take him to ED.  Reason for Disposition  Nursing judgment or information in reference  Answer Assessment - Initial Assessment Questions 1. REASON FOR CALL: "What is your main concern right now?"     "I pulled part of my dialysis catheter out while I was sleeping last night.Dialysis told me to go to ED." 2. ONSET: "When did the  start?"     Last night 3. SEVERITY: "How bad is the ?"     "It hurt really bad." 4. FEVER: "Do you have a fever?"     N/a 5. OTHER SYMPTOMS: "Do you have any other new symptoms?"     No 6. TREATMENTS AND RESPONSE: "What have you done so far to try to make this better? What medicines have you used?"     Called dialysis 7. PREGNANCY: "Is there any chance you are pregnant?" "When was your last menstrual period?"     N/a  Protocols used: No Guideline Available-[facility]

## 2022-05-17 ENCOUNTER — Other Ambulatory Visit: Payer: Self-pay

## 2022-05-17 ENCOUNTER — Emergency Department
Admission: EM | Admit: 2022-05-17 | Discharge: 2022-05-17 | Disposition: A | Discharge status: Home / Self Care | Payer: PPO | Attending: Emergency Medicine | Admitting: Emergency Medicine

## 2022-05-17 ENCOUNTER — Encounter: Payer: Self-pay | Admitting: *Deleted

## 2022-05-17 ENCOUNTER — Emergency Department: Payer: PPO

## 2022-05-17 DIAGNOSIS — I120 Hypertensive chronic kidney disease with stage 5 chronic kidney disease or end stage renal disease: Secondary | ICD-10-CM | POA: Diagnosis not present

## 2022-05-17 DIAGNOSIS — I509 Heart failure, unspecified: Secondary | ICD-10-CM | POA: Insufficient documentation

## 2022-05-17 DIAGNOSIS — R0602 Shortness of breath: Secondary | ICD-10-CM | POA: Diagnosis not present

## 2022-05-17 DIAGNOSIS — N186 End stage renal disease: Secondary | ICD-10-CM | POA: Insufficient documentation

## 2022-05-17 DIAGNOSIS — I10 Essential (primary) hypertension: Secondary | ICD-10-CM | POA: Diagnosis not present

## 2022-05-17 DIAGNOSIS — I110 Hypertensive heart disease with heart failure: Secondary | ICD-10-CM | POA: Insufficient documentation

## 2022-05-17 DIAGNOSIS — G8929 Other chronic pain: Secondary | ICD-10-CM | POA: Insufficient documentation

## 2022-05-17 DIAGNOSIS — M545 Low back pain, unspecified: Secondary | ICD-10-CM | POA: Insufficient documentation

## 2022-05-17 DIAGNOSIS — Z992 Dependence on renal dialysis: Secondary | ICD-10-CM | POA: Insufficient documentation

## 2022-05-17 DIAGNOSIS — E119 Type 2 diabetes mellitus without complications: Secondary | ICD-10-CM | POA: Diagnosis not present

## 2022-05-17 DIAGNOSIS — R059 Cough, unspecified: Secondary | ICD-10-CM | POA: Diagnosis not present

## 2022-05-17 MED ORDER — LIDOCAINE 5 % EX PTCH: 1.0000 | MEDICATED_PATCH | Freq: Two times a day (BID) | CUTANEOUS | 0 refills | Status: DC

## 2022-05-17 MED ORDER — LIDOCAINE 5 % EX PTCH
1.0000 | MEDICATED_PATCH | CUTANEOUS | Status: DC
  Administered 2022-05-17: 1 via TRANSDERMAL
  Filled 2022-05-17: qty 1

## 2022-05-17 NOTE — ED Provider Notes (Signed)
[facility] Provider Note    Event Date/Time   First MD Initiated Contact with Patient 05/17/22 1333     (approximate)   History   Chief Complaint Back Pain (Shortness breath/) and Shortness of Breath   HPI  [name] is a 57 y.o. male with past medical history of hypertension, hyperlipidemia, diabetes, ESRD on HD (TTS), CHF, and chronic pain syndrome who presents to the ED complaining of back pain and shortness of breath.  Patient reports that he has been dealing with increasing back pain in the middle of his lower back over the past couple of weeks, denies any recent trauma.  He states that he was previously prescribed tramadol by his PCP, but has since changed PCPs and they are no longer prescribing this for him.  He states he has been taking Tylenol without relief, denies any numbness or weakness in his legs and has not had any groin numbness or incontinence.  He additionally reports feeling short of breath, which he attributes to his mother keeping the house at 80 degrees consistently.  He reports a recent dry cough, denies any fevers or chest pain.  He states he received a full run of dialysis yesterday, denies any issues with his catheter.     Physical Exam   Triage Vital Signs: ED Triage Vitals  Enc Vitals Group     BP 05/17/22 1342 (!) 148/95     Pulse Rate 05/17/22 1342 80     Resp 05/17/22 1342 18     Temp 05/17/22 1342 98.8 F (37.1 C)     Temp Source 05/17/22 1342 Oral     SpO2 05/17/22 1342 98 %     Weight 05/17/22 1344 (!) 365 lb (165.6 kg)     Height 05/17/22 1344  (1.803 m)     Head Circumference --      Peak Flow --      Pain Score 05/17/22 1343 10     Pain Loc --      Pain Edu? --      Excl. in GC? --     Most recent vital signs: Vitals:   05/17/22 1342  BP: (!) 148/95  Pulse: 80  Resp: 18  Temp: 98.8 F (37.1 C)  SpO2: 98%    Constitutional: Alert and oriented. Eyes: Conjunctivae are normal. Head:  Atraumatic. Nose: No congestion/rhinnorhea. Mouth/Throat: Mucous membranes are moist.  Cardiovascular: Normal rate, regular rhythm. Grossly normal heart sounds.  2+ radial and DP pulses bilaterally.  Right IJ TDC intact. Respiratory: Normal respiratory effort.  No retractions. Lungs CTAB. Gastrointestinal: Soft and nontender. No distention. Musculoskeletal: No lower extremity tenderness nor edema.  Neurologic:  Normal speech and language. No gross focal neurologic deficits are appreciated.    ED Results / Procedures / Treatments   Labs (all labs ordered are listed, but only abnormal results are displayed) Labs Reviewed - No data to display  RADIOLOGY Chest x-ray reviewed and interpreted by me with cardiomegaly and mild edema, no focal infiltrate or effusion noted.  PROCEDURES:  Critical Care performed: No  Procedures   MEDICATIONS ORDERED IN ED: Medications  lidocaine (LIDODERM) 5 % 1 patch (1 patch Transdermal Patch Applied 05/17/22 1450)     IMPRESSION / MDM / ASSESSMENT AND PLAN / ED COURSE  I reviewed the triage vital signs and the nursing notes.  56 y.o. male with past medical history of hypertension, hyperlipidemia, diabetes, and ESRD on HD (TTS), CHF, and chronic pain syndrome who presents to the ED with acute on chronic low back pain along with difficulty breathing.  Patient's presentation is most consistent with acute complicated illness / injury requiring diagnostic workup.  Differential diagnosis includes, but is not limited to, patient well-appearing and in no acute distress, vital signs are unremarkable.  Lungs are clear to auscultation bilaterally and patient not in any respiratory distress, maintaining oxygen saturations at 98% on room air.  Patient primarily attributes his difficulty breathing to his mother's house being hot, which appears to be a chronic complaint for him and mentioned on multiple recent ED visits.  He received a  full run of dialysis yesterday, labs obtained less than 1 week ago and unremarkable at that time.  Do not feel repeat labs needed at this time, will screen chest x-ray for developing infection or other acute process.  His other complaint is back pain, which also appears to be a chronic issue.  He is neurovascular intact to his bilateral lower extremities and no findings concerning for cauda equina.  He is primarily requesting that we prescribe tramadol and I explained to him that we cannot prescribe narcotics for chronic back pain here in the ED.  We will treat with Lidoderm patch and reassess.  Chest x-ray shows cardiomegaly and pulmonary vascular congestion, very similar compared to previous.  Patient continues to breathe comfortably on room air and is ambulatory here in the ED, no apparent indication for emergent dialysis.  He is appropriate for discharge home with PCP follow-up, will be prescribed Lidoderm patches for chronic back pain.  He was counseled to return to the ED for new or worsening symptoms, patient agrees with plan.      FINAL CLINICAL IMPRESSION(S) / ED DIAGNOSES   Final diagnoses:  Chronic bilateral low back pain without sciatica  Chronic shortness of breath  ESRD on hemodialysis     Rx / DC Orders   ED Discharge Orders          Ordered    lidocaine (LIDODERM) 5 %  Every 12 hours        05/17/22 1455             Note:  This document was prepared using Dragon voice recognition software and may include unintentional dictation errors.   Chesley Noon, MD 05/17/22 [phone]

## 2022-05-17 NOTE — ED Notes (Signed)
Patient refused to put legs up on the stretcher and is sitting on the side. Patient continuously asked for O2, but sats are at 98% on room air. No dyspnea noted.Explanation was given why O2 was not given. Patient is calm now, but was agitated with EMTs at his mother's house and en route. Patient states he can call for a ride home.

## 2022-05-17 NOTE — ED Triage Notes (Signed)
Per EMS report, patient lives with his mother and states it's too hot in her house. Patient states he has been out of tramadol for "a few months" which controlled his chronic back pain. Patient states he feels short of breath in his mother's house. Patient had a full dialysis yesterday.

## 2022-05-17 NOTE — ED Notes (Addendum)
Patient is on the phone for a ride home. Patient requested wheelchair for the ride to the lobby. Patient is sitting on the end of the stretcher. No dyspnea noted.

## 2022-05-21 ENCOUNTER — Telehealth: Payer: Self-pay | Admitting: Internal Medicine

## 2022-05-21 NOTE — Telephone Encounter (Signed)
Called patient to schedule Medicare Annual Wellness Visit (AWV). Left message for patient to call back and schedule Medicare Annual Wellness Visit (AWV).  Last date of AWV: 06/28/2021  Please schedule an appointment at any time with [facility] .  If any questions, please contact me at [phone].  Thank you ,  Randon Goldsmith Care Guide [facility] AWV TEAM Direct Dial: [phone]

## 2022-05-22 ENCOUNTER — Encounter: Payer: Self-pay | Admitting: Oncology

## 2022-05-22 ENCOUNTER — Telehealth: Payer: Self-pay

## 2022-05-22 NOTE — Telephone Encounter (Signed)
        Patient  visited [facility] on 4/21     Telephone encounter attempt :  1st  A HIPAA compliant voice message was left requesting a return call.  Instructed patient to call back.    Faiza Bansal [facility],  [phone] [address], [place],  [address] Phone: [phone] Email: [email or web address]       

## 2022-05-22 NOTE — Telephone Encounter (Signed)
        Patient  visited [facility] on 4/21     Telephone encounter attempt :  1st  A HIPAA compliant voice message was left requesting a return call.  Instructed patient to call back.    [name] [facility], [place] [phone] [address], [place], [place] [address] Phone: [phone] Email: [email or web address]       

## 2022-05-25 DIAGNOSIS — R059 Cough, unspecified: Secondary | ICD-10-CM | POA: Diagnosis not present

## 2022-05-25 DIAGNOSIS — M545 Low back pain, unspecified: Secondary | ICD-10-CM | POA: Diagnosis not present

## 2022-05-25 DIAGNOSIS — E1122 Type 2 diabetes mellitus with diabetic chronic kidney disease: Secondary | ICD-10-CM | POA: Diagnosis not present

## 2022-05-25 DIAGNOSIS — J4520 Mild intermittent asthma, uncomplicated: Secondary | ICD-10-CM | POA: Diagnosis not present

## 2022-05-27 ENCOUNTER — Ambulatory Visit: Payer: Self-pay | Admitting: *Deleted

## 2022-05-27 NOTE — Patient Instructions (Signed)
Visit Information  Thank you for taking time to visit with me today. Please don't hesitate to contact me if I can be of assistance to you.   Following are the goals we discussed today:   Goals Addressed             This Visit's Progress    Housing resources       Care Coordination Interventions: Housing options discussed-alternatives continue to be discussed Per patient [employer] contacted-they no longer have availabilities Patient remains on waiting list for [employer]  Patient willing to follow up with mental health counseling           Our next appointment is by telephone on 06/10/22 at 2pm  Please call the care guide team at [phone] if you need to cancel or reschedule your appointment.   If you are experiencing a Mental Health or Behavioral Health Crisis or need someone to talk to, please call the Suicide and Crisis Lifeline: 988   Patient verbalizes understanding of instructions and care plan provided today and agrees to view in MyChart. Active MyChart status and patient understanding of how to access instructions and care plan via MyChart confirmed with patient.     Telephone follow up appointment with care management team member scheduled for: 06/10/22  Verna Czech, LCSW Clinical Social Worker  [facility] Care Management [phone]

## 2022-05-27 NOTE — Patient Outreach (Signed)
  Care Coordination   Follow Up Visit Note   05/27/2022 Name: RONDARIUS KADRMAS MRN: [id number] DOB: 06-14-1965  Belia Heman Yuan is a 57 y.o. year old male who sees Margarita Mail, DO for primary care. I spoke with  Belia Heman Svec by phone today.  What matters to the patients health and wellness today?  Patient currently lives temporarily with his mother, however situation per patient is very stressful and would like other housing alternatives.     Goals Addressed             This Visit's Progress    Housing resources       Care Coordination Interventions: Housing options discussed-alternatives continue to be discussed Per patient [employer] contacted-they no longer have availabilities Patient remains on waiting list for [employer]  Patient willing to follow up with mental health counseling           SDOH assessments and interventions completed:  No     Care Coordination Interventions:  Yes, provided  Interventions Today    Flowsheet Row Most Recent Value  Chronic Disease   Chronic disease during today's visit Chronic Kidney Disease/End Stage Renal Disease (ESRD)  General Interventions   General Interventions Discussed/Reviewed General Interventions Reviewed, [employer] resources reviewed-per patient, he has contacted the shelters and boarding homes-no options-still on waiting list for [facility] [facility] ALF placement-not receptive]  Mental Health Interventions   Mental Health Discussed/Reviewed Coping Strategies, Depression  [Discussed depressed mood related to housing issue-currently resides with mother however relationship is strained-emotional support provided, coping strategies discussed-maintaining boundaries and personal space]  Safety Interventions   Safety Discussed/Reviewed Safety Discussed  [patient denies thoughts of self harm, encouraged to call 988 if in experiencing a mental health crisis]       Follow up  plan: Follow up call scheduled for 06/10/22    Encounter Outcome:  Pt. Visit Completed

## 2022-05-28 ENCOUNTER — Telehealth: Payer: Self-pay | Admitting: *Deleted

## 2022-05-28 DIAGNOSIS — N189 Chronic kidney disease, unspecified: Secondary | ICD-10-CM | POA: Diagnosis not present

## 2022-05-28 DIAGNOSIS — N179 Acute kidney failure, unspecified: Secondary | ICD-10-CM | POA: Diagnosis not present

## 2022-05-28 DIAGNOSIS — D509 Iron deficiency anemia, unspecified: Secondary | ICD-10-CM | POA: Diagnosis not present

## 2022-05-28 NOTE — Patient Outreach (Signed)
  Care Coordination   Multidisciplinary Case Review Note    05/28/2022 Name: [name] MRN: [id number] DOB: 02-Jun-1965  [name] is a 57 y.o. year old male who sees Margarita Mail, DO for primary care.  The  multidisciplinary care team met today to review patient care needs and barriers.    CSW will place referral and provide resources for mental health resources.  Patient has appointment with new PCP on 5/15, dismissed from previous practice for non-compliance.   SDOH assessments and interventions completed:  No     Care Coordination Interventions Activated:  Yes   Care Coordination Interventions:  Yes, provided   Interventions Today    Flowsheet Row Most Recent Value  Chronic Disease   Chronic disease during today's visit Other  [Multidisciplinary discussion]  General Interventions   General Interventions Discussed/Reviewed General Interventions Reviewed, Communication with  Communication with RN, Social Work  Museum/gallery curator to establishing plan of care]       Follow up plan: Follow up call scheduled for 5/10. Will plan to close case at that time, patient currently not eligible for [facility] services.    Multidisciplinary Team Attendees:   D. Chilton Si, RNCM F. Pleasant, RNCM Earlyne Iba, CSW   Scribe for Multidisciplinary Case Review:  Kemper Durie, RN, MSN, [facility] Care Management Care Management Coordinator [phone]

## 2022-06-05 ENCOUNTER — Ambulatory Visit: Payer: Self-pay | Admitting: *Deleted

## 2022-06-05 NOTE — Patient Outreach (Signed)
Care Coordination   Follow Up Visit Note   06/05/2022 Name: [name] MRN: [id number] DOB: [birth year]  Darren Heman Spanel is a 58 y.o. year old male who sees Margarita Mail, DO for primary care. I spoke with  [name] by phone today.  What matters to the patients health and wellness today?  Discussing importance of managing chronic conditions, patient state he feels health is stable currently and his main concern is moving from his mother's home.  He has refused to follow up on resources provided by CSW, she will follow up again next week.     Goals Addressed             This Visit's Progress    Effective management of ESRD   On track    Care Coordination Interventions: Assessed the Patient understanding of chronic kidney disease    Evaluation of current treatment plan related to chronic kidney disease self management and patient's adherence to plan as established by provider      Reviewed medications with patient and discussed importance of compliance    Advised patient, providing education and rationale, to monitor blood pressure daily and record, calling PCP for findings outside established parameters    Discussed complications of poorly controlled blood pressure such as heart disease, stroke, circulatory complications, vision complications, kidney impairment, sexual dysfunction    Discussed plans with patient for ongoing care management follow up and provided patient with direct contact information for care management team    Discussed the impact of chronic kidney disease on daily life and mental health and acknowledged and normalized feelings of disempowerment, fear, and frustration     Discussed current situation of being homeless, will collaborate with CSW for resources Advised importance of daily weights, blood pressure & blood sugar checks, State these are monitored at HD sessions.      Effective management of neuropathy   On track    Care Coordination  Interventions: Evaluation of current treatment plan related to neuropathy and patient's adherence to plan as established by provider Advised patient to keep legs elevated, increase movement and activity as tolerated in effort to increase circulation Reviewed medications with patient and discussed adherence and affordability.  Active with pharmacy team Advised patient, providing education and rationale, to check cbg daily (last A1C as 6.2) and record, calling PCP for findings outside established parameters  Discussed plans with patient for ongoing care management follow up and provided patient with direct contact information for care management team         SDOH assessments and interventions completed:  No     Care Coordination Interventions:  Yes, provided   Interventions Today    Flowsheet Row Most Recent Value  Chronic Disease   Chronic disease during today's visit Chronic Kidney Disease/End Stage Renal Disease (ESRD)  General Interventions   General Interventions Discussed/Reviewed General Interventions Reviewed, Doctor Visits, Durable Medical Equipment (DME)  [New PCP on 5/15 and CSW follow up 5/15]  Doctor Visits Discussed/Reviewed Doctor Visits Reviewed, PCP  Durable Medical Equipment (DME) Glucomoter, Other  [Reminded to get scale, BP monitor, and glucometer out of storage]  Education Interventions   Education Provided Provided Education  Provided Verbal Education On Blood Sugar Monitoring, Medication, When to see the doctor  [Discussed going to PCP for close follow up prior to ED visits]        Follow up plan: Follow up call scheduled for 6/7    Encounter Outcome:  Pt. Visit Completed  Valente Devinn, RN, MSN, [facility] Care Management Care Management Coordinator [phone]

## 2022-06-10 ENCOUNTER — Ambulatory Visit: Payer: Self-pay | Admitting: *Deleted

## 2022-06-10 DIAGNOSIS — F321 Major depressive disorder, single episode, moderate: Secondary | ICD-10-CM | POA: Diagnosis not present

## 2022-06-10 DIAGNOSIS — J9611 Chronic respiratory failure with hypoxia: Secondary | ICD-10-CM | POA: Diagnosis not present

## 2022-06-10 DIAGNOSIS — E1122 Type 2 diabetes mellitus with diabetic chronic kidney disease: Secondary | ICD-10-CM | POA: Diagnosis not present

## 2022-06-10 DIAGNOSIS — I509 Heart failure, unspecified: Secondary | ICD-10-CM | POA: Diagnosis not present

## 2022-06-10 DIAGNOSIS — N186 End stage renal disease: Secondary | ICD-10-CM | POA: Diagnosis not present

## 2022-06-10 DIAGNOSIS — M1A9XX0 Chronic gout, unspecified, without tophus (tophi): Secondary | ICD-10-CM | POA: Diagnosis not present

## 2022-06-10 DIAGNOSIS — N2581 Secondary hyperparathyroidism of renal origin: Secondary | ICD-10-CM | POA: Diagnosis not present

## 2022-06-10 DIAGNOSIS — J309 Allergic rhinitis, unspecified: Secondary | ICD-10-CM | POA: Diagnosis not present

## 2022-06-10 DIAGNOSIS — I10 Essential (primary) hypertension: Secondary | ICD-10-CM | POA: Diagnosis not present

## 2022-06-10 DIAGNOSIS — E782 Mixed hyperlipidemia: Secondary | ICD-10-CM | POA: Diagnosis not present

## 2022-06-10 DIAGNOSIS — G894 Chronic pain syndrome: Secondary | ICD-10-CM | POA: Diagnosis not present

## 2022-06-10 NOTE — Patient Outreach (Signed)
  Care Coordination   Follow Up Visit Note   06/10/2022 Name: [name] MRN: [id number] DOB: 1965-04-18  [name] is a 57 y.o. year old male who sees Margarita Mail, DO for primary care. I spoke with  Darren Heman Fillinger by phone today.  What matters to the patients health and wellness today?  Patient would like to identify alternative housing options, however options are limited due to finances and availability. Patient discussed willingness to set healthy boundaries in current setting and is also willing to accept referral for ongoing counseling.  Goals Addressed             This Visit's Progress    Housing resources       Care Coordination Interventions: Interventions Today    Flowsheet Row Most Recent Value  Chronic Disease   Chronic disease during today's visit Chronic Kidney Disease/End Stage Renal Disease (ESRD)  General Interventions   General Interventions Discussed/Reviewed General Interventions Reviewed  Doctor Visits Discussed/Reviewed Doctor Visits Discussed  Mental Health Interventions   Mental Health Discussed/Reviewed Mental Health Discussed, Coping Strategies, Depression  [continue to explore coping strategies to address family conflict-maintaining boundaries discussed-patient agreeable to ongoing mental health follow up]  Safety Interventions   Safety Discussed/Reviewed Safety Reviewed  [patient denies thoughts of harm to self or others, encouraged patient to contact 988 in the event of a mental health emergency]                SDOH assessments and interventions completed:  No     Care Coordination Interventions:  Yes, provided  Interventions Today    Flowsheet Row Most Recent Value  Chronic Disease   Chronic disease during today's visit Chronic Kidney Disease/End Stage Renal Disease (ESRD)  General Interventions   General Interventions Discussed/Reviewed General Interventions Reviewed  Doctor Visits Discussed/Reviewed Doctor  Visits Discussed  Mental Health Interventions   Mental Health Discussed/Reviewed Mental Health Discussed, Coping Strategies, Depression  [continue to explore coping strategies to address family conflict-maintaining boundaries discussed-patient agreeable to ongoing mental health follow up]  Safety Interventions   Safety Discussed/Reviewed Safety Reviewed  [patient denies thoughts of harm to self or others, encouraged patient to contact 988 in the event of a mental health emergency]       Follow up plan: Follow up call scheduled for 06/24/22    Encounter Outcome:  Pt. Visit Completed

## 2022-06-10 NOTE — Patient Instructions (Signed)
Visit Information  Thank you for taking time to visit with me today. Please don't hesitate to contact me if I can be of assistance to you.   Following are the goals we discussed today:   Goals Addressed             This Visit's Progress    Housing resources       Care Coordination Interventions: Interventions Today    Flowsheet Row Most Recent Value  Chronic Disease   Chronic disease during today's visit Chronic Kidney Disease/End Stage Renal Disease (ESRD)  General Interventions   General Interventions Discussed/Reviewed General Interventions Reviewed  Doctor Visits Discussed/Reviewed Doctor Visits Discussed  Mental Health Interventions   Mental Health Discussed/Reviewed Mental Health Discussed, Coping Strategies, Depression  [continue to explore coping strategies to address family conflict-maintaining boundaries discussed-patient agreeable to ongoing mental health follow up]  Safety Interventions   Safety Discussed/Reviewed Safety Reviewed  [patient denies thoughts of harm to self or others, encouraged patient to contact 988 in the event of a mental health emergency]                Our next appointment is by telephone on 06/24/22 at 2pm  Please call the care guide team at [phone] if you need to cancel or reschedule your appointment.   If you are experiencing a Mental Health or Behavioral Health Crisis or need someone to talk to, please call the Suicide and Crisis Lifeline: 988   Patient verbalizes understanding of instructions and care plan provided today and agrees to view in MyChart. Active MyChart status and patient understanding of how to access instructions and care plan via MyChart confirmed with patient.     Telephone follow up appointment with care management team member scheduled for: 06/24/22   Verna Czech, LCSW Clinical Social Worker  [facility] Care Management [phone]

## 2022-06-10 NOTE — Patient Outreach (Deleted)
{  CARE COORDINATION NOTES:27886} 

## 2022-06-18 DIAGNOSIS — N179 Acute kidney failure, unspecified: Secondary | ICD-10-CM | POA: Insufficient documentation

## 2022-06-18 DIAGNOSIS — I10 Essential (primary) hypertension: Secondary | ICD-10-CM | POA: Insufficient documentation

## 2022-06-18 DIAGNOSIS — R600 Localized edema: Secondary | ICD-10-CM | POA: Diagnosis not present

## 2022-06-18 DIAGNOSIS — N2581 Secondary hyperparathyroidism of renal origin: Secondary | ICD-10-CM | POA: Diagnosis not present

## 2022-06-18 DIAGNOSIS — E1122 Type 2 diabetes mellitus with diabetic chronic kidney disease: Secondary | ICD-10-CM | POA: Diagnosis not present

## 2022-06-18 DIAGNOSIS — N184 Chronic kidney disease, stage 4 (severe): Secondary | ICD-10-CM | POA: Diagnosis not present

## 2022-06-19 ENCOUNTER — Telehealth: Payer: Self-pay | Admitting: Internal Medicine

## 2022-06-19 NOTE — Telephone Encounter (Signed)
Left detailed vm, according to letter we sent him non compliance

## 2022-06-19 NOTE — Telephone Encounter (Signed)
Copied from CRM [id number]. Topic: General - Other >> Jun 19, 2022  8:38 AM Dondra Prader A wrote: Reason for CRM:  Junious Silk Social Worker with adult protective services with [place] [facility] is calling to speak with pt PCP. Crystal is calling to see why pt was released from the practice and is going to [facility]. Please advise. Crystal Phone Number: [phone] per Crystal she has a confidential voice mail and a message can be left.

## 2022-06-21 ENCOUNTER — Encounter: Payer: Self-pay | Admitting: Oncology

## 2022-06-23 ENCOUNTER — Ambulatory Visit: Payer: PPO | Admitting: Internal Medicine

## 2022-06-24 ENCOUNTER — Telehealth (INDEPENDENT_AMBULATORY_CARE_PROVIDER_SITE_OTHER): Payer: Self-pay

## 2022-06-24 ENCOUNTER — Ambulatory Visit: Payer: Self-pay | Admitting: *Deleted

## 2022-06-24 NOTE — Patient Instructions (Signed)
Visit Information  Thank you for taking time to visit with me today. Please don't hesitate to contact me if I can be of assistance to you.   Following are the goals we discussed today:   Goals Addressed             This Visit's Progress    Housing and mental health resources       Care Coordination Interventions: Interventions Today    Flowsheet Row Most Recent Value  General Interventions   General Interventions Discussed/Reviewed [employer] resources previously provided, discussed that housing options are limited-patient will remain with his mother at this time until alternative options cans be identified]  Mental Health Interventions   Mental Health Discussed/Reviewed Mental Health Reviewed, Coping Strategies, Depression  [discussed in network mental health providers, list to be mailed to patient's home for ongoing mental health foillow up. reinforced use of positive coping strategies]  Safety Interventions   Safety Discussed/Reviewed Safety Reviewed  [patient denies thoughts of harm to self or others, encouraged patient ti call 988 in the event of a mental health emergency]                Our next appointment is by telephone on 07/08/22 at 1pm  Please call the care guide team at [phone] if you need to cancel or reschedule your appointment.   If you are experiencing a Mental Health or Behavioral Health Crisis or need someone to talk to, please call the Suicide and Crisis Lifeline: 988   Patient verbalizes understanding of instructions and care plan provided today and agrees to view in MyChart. Active MyChart status and patient understanding of how to access instructions and care plan via MyChart confirmed with patient.     Telephone follow up appointment with care management team member scheduled for: 07/08/22  Verna Czech, LCSW Clinical Social Worker  [facility] Care Management [phone]

## 2022-06-24 NOTE — Telephone Encounter (Signed)
I attempted to contact the patient to schedule his permcath removal with Dr. Wyn Quaker. A message was left for return call.

## 2022-06-24 NOTE — Patient Outreach (Signed)
  Care Coordination   Follow Up Visit Note   06/24/2022 Name: [name] MRN: [id number] DOB: 07-21-1965  [name] is a 57 y.o. year old male who sees Margarita Mail, DO for primary care. I spoke with  [name] by phone today.  What matters to the patients health and wellness today?  Housing resources and mental health follow up.     Goals Addressed             This Visit's Progress    Housing and mental health resources       Care Coordination Interventions: Interventions Today    Flowsheet Row Most Recent Value  General Interventions   General Interventions Discussed/Reviewed [employer]  [housing resources previously provided, discussed that housing options are limited-patient will remain with his mother at this time until alternative options cans be identified]  Mental Health Interventions   Mental Health Discussed/Reviewed Mental Health Reviewed, Coping Strategies, Depression  [discussed in network mental health providers, list to be mailed to patient's home for ongoing mental health foillow up. reinforced use of positive coping strategies]  Safety Interventions   Safety Discussed/Reviewed Safety Reviewed  [patient denies thoughts of harm to self or others, encouraged patient ti call 988 in the event of a mental health emergency]                SDOH assessments and interventions completed:  No     Care Coordination Interventions:  Yes, provided   Follow up plan: Follow up call scheduled for 07/02/22    Encounter Outcome:  Pt. Visit Completed

## 2022-06-26 ENCOUNTER — Other Ambulatory Visit: Payer: Self-pay | Admitting: Internal Medicine

## 2022-06-26 DIAGNOSIS — M1A39X0 Chronic gout due to renal impairment, multiple sites, without tophus (tophi): Secondary | ICD-10-CM

## 2022-06-29 NOTE — Telephone Encounter (Signed)
Patient returned my call and he has been scheduled to 07/06/22 with a 2:30 pm arrival time to the [facility] for a permcath removal with Dr. Wyn Quaker. Pre-procedure instructions were discussed and will be mailed.

## 2022-07-02 ENCOUNTER — Encounter: Payer: PPO | Admitting: *Deleted

## 2022-07-03 ENCOUNTER — Ambulatory Visit: Payer: Self-pay | Admitting: *Deleted

## 2022-07-03 NOTE — Patient Outreach (Signed)
  Care Coordination   07/03/2022 Name: [name] MRN: [id number] DOB: 1965-02-17   Care Coordination Outreach Attempts:  An unsuccessful telephone outreach was attempted for a scheduled appointment today.  Follow Up Plan:  Additional outreach attempts will be made to offer the patient care coordination information and services.   Encounter Outcome:  No Answer   Care Coordination Interventions:  No, not indicated    Kemper Durie, RN, MSN, [facility] Care Management Care Management Coordinator [phone]

## 2022-07-06 ENCOUNTER — Ambulatory Visit
Admission: RE | Admit: 2022-07-06 | Discharge: 2022-07-06 | Disposition: A | Discharge status: Home / Self Care | Payer: PPO | Attending: Vascular Surgery | Admitting: Vascular Surgery

## 2022-07-06 ENCOUNTER — Encounter
Admission: RE | Disposition: A | Discharge status: Home / Self Care | Payer: Self-pay | Source: Home / Self Care | Attending: Vascular Surgery

## 2022-07-06 DIAGNOSIS — Z452 Encounter for adjustment and management of vascular access device: Secondary | ICD-10-CM

## 2022-07-06 DIAGNOSIS — I120 Hypertensive chronic kidney disease with stage 5 chronic kidney disease or end stage renal disease: Secondary | ICD-10-CM | POA: Insufficient documentation

## 2022-07-06 DIAGNOSIS — E1122 Type 2 diabetes mellitus with diabetic chronic kidney disease: Secondary | ICD-10-CM | POA: Diagnosis not present

## 2022-07-06 DIAGNOSIS — N189 Chronic kidney disease, unspecified: Secondary | ICD-10-CM

## 2022-07-06 DIAGNOSIS — Z4901 Encounter for fitting and adjustment of extracorporeal dialysis catheter: Secondary | ICD-10-CM | POA: Diagnosis not present

## 2022-07-06 DIAGNOSIS — N186 End stage renal disease: Secondary | ICD-10-CM | POA: Diagnosis not present

## 2022-07-06 HISTORY — PX: DIALYSIS/PERMA CATHETER REMOVAL: CATH118289

## 2022-07-06 SURGERY — DIALYSIS/PERMA CATHETER REMOVAL
Anesthesia: LOCAL

## 2022-07-06 MED ORDER — LIDOCAINE-EPINEPHRINE (PF) 1 %-1:200000 IJ SOLN
INTRAMUSCULAR | Status: DC | PRN
  Administered 2022-07-06: 20 mL via INTRADERMAL

## 2022-07-06 SURGICAL SUPPLY — 4 items
APL PRP STRL LF DISP 70% ISPRP (MISCELLANEOUS) ×2
CHLORAPREP W/TINT 26 (MISCELLANEOUS) IMPLANT
FORCEPS HALSTEAD CVD 5IN STRL (INSTRUMENTS) IMPLANT
TRAY LACERAT/PLASTIC (MISCELLANEOUS) IMPLANT

## 2022-07-06 NOTE — Op Note (Signed)
Operative Note     Preoperative diagnosis:   1. ESRD with return of renal function  Postoperative diagnosis:  1. same  Procedure:  Removal of right jugular Permcath  Surgeon:  Festus Barren, MD  Assistant: Rolla Plate, NP  Anesthesia:  Local  EBL:  Minimal  Indication for the Procedure:  The patient has had return of enough renal function to come off of dialysis.  This can be removed.  Risks and benefits are discussed and informed consent is obtained.  Description of the Procedure:  The patient's right neck, chest and existing catheter were sterilely prepped and draped. The area around the catheter was anesthetized copiously with 1% lidocaine. The catheter was dissected out with curved hemostats until the cuff was freed from the surrounding fibrous sheath. The fiber sheath was transected, and the catheter was then removed in its entirety using gentle traction. Pressure was held and sterile dressings were placed. The patient tolerated the procedure well and was taken to the recovery room in stable condition.     Festus Barren  07/06/2022, 3:20 PM This note was created with Dragon Medical transcription system. Any errors in dictation are purely unintentional.

## 2022-07-06 NOTE — H&P (Signed)
[facility] Admission History & Physical  MRN : [id number]  [name] is a 57 y.o. (31-Aug-1965) male who presents with chief complaint of No chief complaint on file. [name] Kitchen  History of Present Illness: I am asked to evaluate the patient by the dialysis center. The patient was started on dialysis but has return of renal function and that he no longer needs dialysis.  He does still have stage IV chronic kidney disease.  We are asked to remove his PermCath.   Patient denies pain or tenderness overlying the access.  There is no pain with dialysis.  The patient denies hand pain or finger pain consistent with steal syndrome.  No fevers or chills while on dialysis.    No current facility-administered medications for this encounter.    Past Medical History:  Diagnosis Date   Allergy    Asthma    Diabetes mellitus without complication (HCC)    Gout    Hyperlipidemia    Hypertension    Morbid obesity (HCC)     Past Surgical History:  Procedure Laterality Date   DIALYSIS/PERMA CATHETER INSERTION N/A 04/29/2022   Procedure: DIALYSIS/PERMA CATHETER INSERTION;  Surgeon: Annice Needy, MD;  Location: [facility] INVASIVE CV LAB;  Service: Cardiovascular;  Laterality: N/A;   TEMPORARY DIALYSIS CATHETER Right 11/14/2021   Procedure: TEMPORARY DIALYSIS CATHETER;  Surgeon: Annice Needy, MD;  Location: [facility] INVASIVE CV LAB;  Service: Cardiovascular;  Laterality: Right;   TEMPORARY DIALYSIS CATHETER N/A 04/24/2022   Procedure: TEMPORARY DIALYSIS CATHETER;  Surgeon: Renford Dills, MD;  Location: [facility] INVASIVE CV LAB;  Service: Cardiovascular;  Laterality: N/A;     Social History   Tobacco Use   Smoking status: Never   Smokeless tobacco: Never  Vaping Use   Vaping Use: Never used  Substance Use Topics   Alcohol use: Yes    Comment: a glass of wine and a couple beers a day   Drug use: No     Family History  Problem Relation Age of Onset   Diabetes Mother     Hypertension Mother    Alcohol abuse Father    Glaucoma Father    Diabetes Father    Hypertension Father    Cancer Neg Hx    COPD Neg Hx    Heart disease Neg Hx    Stroke Neg Hx     No family history of bleeding or clotting disorders, autoimmune disease or porphyria  Allergies  Allergen Reactions   Talc     Nose bleeds     REVIEW OF SYSTEMS (Negative unless checked)  Constitutional: [] Weight loss  [] Fever  [] Chills Cardiac: [] Chest pain   [] Chest pressure   [] Palpitations   [] Shortness of breath when laying flat   [] Shortness of breath at rest   [x] Shortness of breath with exertion. Vascular:  [] Pain in legs with walking   [] Pain in legs at rest   [] Pain in legs when laying flat   [] Claudication   [] Pain in feet when walking  [] Pain in feet at rest  [] Pain in feet when laying flat   [] History of DVT   [] Phlebitis   [] Swelling in legs   [] Varicose veins   [] Non-healing ulcers Pulmonary:   [] Uses home oxygen   [] Productive cough   [] Hemoptysis   [] Wheeze  [] COPD   [x] Asthma Neurologic:  [] Dizziness  [] Blackouts   [] Seizures   [] History of stroke   [] History of TIA  [] Aphasia   [] Temporary blindness   []   Dysphagia   [] Weakness or numbness in arms   [] Weakness or numbness in legs Musculoskeletal:  [x] Arthritis   [] Joint swelling   [] Joint pain   [] Low back pain Hematologic:  [] Easy bruising  [] Easy bleeding   [] Hypercoagulable state   [] Anemic  [] Hepatitis Gastrointestinal:  [] Blood in stool   [] Vomiting blood  [] Gastroesophageal reflux/heartburn   [] Difficulty swallowing. Genitourinary:  [x] Chronic kidney disease   [] Difficult urination  [] Frequent urination  [] Burning with urination   [] Blood in urine Skin:  [] Rashes   [] Ulcers   [] Wounds Psychological:  [x] History of anxiety   []  History of major depression.  Physical Examination  There were no vitals filed for this visit. There is no height or weight on file to calculate BMI. Gen: WD/WN, NAD.obese Head: [place]/AT, No temporalis  wasting.  Ear/Nose/Throat: Hearing grossly intact, nares w/o erythema or drainage, oropharynx w/o Erythema/Exudate,  Eyes: Conjunctiva clear, sclera non-icteric Neck: Trachea midline.  No JVD.  Pulmonary:  Good air movement, respirations not labored, no use of accessory muscles.  Cardiac: RRR, normal S1, S2. Vascular:  Vessel Right Left  Radial Palpable Palpable   Musculoskeletal: M/S 5/5 throughout.  Extremities without ischemic changes.  No deformity or atrophy.  Neurologic: Sensation grossly intact in extremities.  Symmetrical.  Speech is fluent. Motor exam as listed above. Psychiatric: Judgment intact, Mood & affect appropriate for pt's clinical situation. Dermatologic: No rashes or ulcers noted.  No cellulitis or open wounds.    CBC Lab Results  Component Value Date   WBC 5.2 05/11/2022   HGB 10.8 (L) 05/11/2022   HCT 38.2 (L) 05/11/2022   MCV 103.2 (H) 05/11/2022   PLT 136 (L) 05/11/2022    BMET    Component Value Date/Time   NA 141 05/11/2022 0937   NA 142 08/07/2014 0848   K 4.1 05/11/2022 0937   CL 105 05/11/2022 0937   CO2 28 05/11/2022 0937   GLUCOSE 135 (H) 05/11/2022 0937   BUN 39 (H) 05/11/2022 0937   BUN 23 08/07/2014 0848   CREATININE 2.51 (H) 05/11/2022 0937   CREATININE 2.69 (H) 05/08/2022 1419   CALCIUM 8.7 (L) 05/11/2022 0937   GFRNONAA 29 (L) 05/11/2022 0937   GFRNONAA 42 (L) 10/05/2018 0755   GFRAA 48 (L) 10/05/2018 0755   CrCl cannot be calculated (Patient's most recent lab result is older than the maximum 21 days allowed.).  COAG Lab Results  Component Value Date   INR 1.20 02/28/2018   INR 1.02 10/04/2016    Radiology No results found.  Assessment/Plan 1.  Complication dialysis device:  Patient's Tunneled catheter is not being used. The patient is not currently requiring dialysis.  Therefore, the patient will undergo removal of the tunneled catheter under local anesthesia.  The risks and benefits were described to the patient.  All  questions were answered.  The patient agrees to proceed with angiography and intervention. Potassium will be drawn to ensure that it is an appropriate level prior to performing intervention. 2.  Chronic kidney disease stage IV: Renal function has returned to his baseline and he no longer needs dialysis or his PermCath. 3.  Hypertension:  Patient will continue medical management; nephrology is following no changes in oral medications. 4. Diabetes mellitus:  Glucose will be monitored and oral medications been held this morning once the patient has undergone the patient's procedure po intake will be reinitiated and again Accu-Cheks will be used to assess the blood glucose level and treat as needed. The patient will be restarted  on the patient's usual hypoglycemic regime    Festus Barren, MD  07/06/2022 2:22 PM

## 2022-07-07 ENCOUNTER — Encounter: Payer: Self-pay | Admitting: Vascular Surgery

## 2022-07-07 NOTE — Interval H&P Note (Signed)
History and Physical Interval Note:  07/07/2022 8:26 AM  [name]  has presented today for surgery, with the diagnosis of Perma Cath Removal   End Stage Renal.  The various methods of treatment have been discussed with the patient and family. After consideration of risks, benefits and other options for treatment, the patient has consented to  Procedure(s): DIALYSIS/PERMA CATHETER REMOVAL (N/A) as a surgical intervention.  The patient's history has been reviewed, patient examined, no change in status, stable for surgery.  I have reviewed the patient's chart and labs.  Questions were answered to the patient's satisfaction.     Levora Dredge

## 2022-07-08 ENCOUNTER — Ambulatory Visit: Payer: Self-pay | Admitting: *Deleted

## 2022-07-08 ENCOUNTER — Telehealth: Payer: Self-pay | Admitting: *Deleted

## 2022-07-08 NOTE — Patient Outreach (Signed)
  Care Coordination   07/08/2022 Name: [name] MRN: [id number] DOB: Oct 02, 1965   Care Coordination Outreach Attempts:  An unsuccessful telephone outreach was attempted today to offer the patient information about available care coordination services.  Follow Up Plan:  Additional outreach attempts will be made to offer the patient care coordination information and services.   Encounter Outcome:  No Answer   Care Coordination Interventions:  No, not indicated    Franchelle Foskett, LCSW Clinical Social Worker  [facility] Care Management [phone]

## 2022-07-09 NOTE — Patient Outreach (Signed)
  Care Coordination   Follow Up Visit Note   07/09/2022 Late Entry Name: [name] MRN: [id number] DOB: [birth year]  [name] is a 57 y.o. year old male who sees [name], [place] for primary care. I spoke with  [name] by phone today.  What matters to the patients health and wellness today?  Housing and mental health follow up    Goals Addressed             This Visit's Progress    Housing and mental health resources       Care Coordination Interventions: Interventions Today    Flowsheet Row Most Recent Value  Chronic Disease   Chronic disease during today's visit Hypertension (HTN), Chronic Kidney Disease/End Stage Renal Disease (ESRD)  General Interventions   General Interventions Discussed/Reviewed General Interventions Reviewed, Community Resources  [facility] C[facility] consent received for possible resources for housing.]  Mental Health Interventions   Mental Health Discussed/Reviewed Mental Health Reviewed, Coping Strategies, Depression  [Pt discussed conflicts with mo over housing, coping strategies discussed:prioritizing goal of saving to move, shelter options as well as changing perespective while in mother's home. Pt enocuraged to f/u with mental health resources provided by mail]                SDOH assessments and interventions completed:  No     Care Coordination Interventions:  Yes, provided   Follow up plan: Follow up call scheduled for 07/22/22    Encounter Outcome:  Pt. Visit Completed

## 2022-07-09 NOTE — Patient Instructions (Signed)
Visit Information  Thank you for taking time to visit with me today. Please don't hesitate to contact me if I can be of assistance to you.   Following are the goals we discussed today:  [facility] referral to be made for housing options Please contact mental health provider of choice for ongoing mental health counseling   Our next appointment is by telephone on 07/22/22 at 1pm  Please call the care guide team at [phone] if you need to cancel or reschedule your appointment.   If you are experiencing a Mental Health or Behavioral Health Crisis or need someone to talk to, please call the Suicide and Crisis Lifeline: 988   Patient verbalizes understanding of instructions and care plan provided today and agrees to view in MyChart. Active MyChart status and patient understanding of how to access instructions and care plan via MyChart confirmed with patient.     Telephone follow up appointment with care management team member scheduled for: 07/22/22  Verna Czech, LCSW Clinical Social Worker  [facility] Care Management [phone]

## 2022-07-17 ENCOUNTER — Encounter: Payer: Self-pay | Admitting: *Deleted

## 2022-07-17 NOTE — Patient Outreach (Signed)
  Care Coordination      Visit Note   07/17/2022 Name: KENRICK PORE MRN: [id number] DOB: [birth year]  Belia Heman Paar is a 57 y.o. year old male who sees Carren Rang, [place] for primary care. I  completed a referral for [facility]  for housing assistance.  What matters to the patients health and wellness today?  Housing Assistance    Goals Addressed             This Visit's Progress    Housing and mental health resources       Care Coordination Interventions: Interventions Today    Flowsheet Row Most Recent Value  Chronic Disease   Chronic disease during today's visit Hypertension (HTN)  General Interventions   General Interventions Discussed/Reviewed Community Resources  [id number] referral completed for assistance with housing]                SDOH assessments and interventions completed:  No     Care Coordination Interventions:  Yes, provided   Follow up plan: Follow up call scheduled for 07/22/22    Encounter Outcome:  Pt. Visit Completed

## 2022-07-22 ENCOUNTER — Ambulatory Visit: Payer: Self-pay | Admitting: *Deleted

## 2022-07-22 NOTE — Patient Outreach (Signed)
  Care Coordination   Follow Up Visit Note   07/22/2022 Name: [name] MRN: [id number] DOB: 1965-11-28  [name] is a 57 y.o. year old male who sees Carren Rang, [place] for primary care. I spoke with  [name] by phone today.  What matters to the patients health and wellness today?  Patient states that he continues to reside with his mother until alternate housing is identified. Housing resources provided to patient as well as mental health resources. [facility] referral also placed. Patient will follow up with resources provided.  Patient verbalized having no additional resource needs at this time.   Goals Addressed             This Visit's Progress    Housing and mental health resources       Care Coordination Interventions: Interventions Today    Flowsheet Row Most Recent Value  Chronic Disease   Chronic disease during today's visit Hypertension (HTN)  General Interventions   General Interventions Discussed/Reviewed [employer] cont. to look for housing, resources provided-Pt paying  debts back and now starting to save for his own place. Pt verbalized lack of confidence that housing situation will change. Will continue to reside with mo.until alternate housing are identified]  Mental Health Interventions   Mental Health Discussed/Reviewed Mental Health Reviewed, Coping Strategies, Depression  [Pt has received mental health resources and agreeable to call to schedule appt. patient provided with 988 and mobile crisis number in the event of a mental health crisis]  Safety Interventions   Safety Discussed/Reviewed Safety Discussed  [patient encouraged to contact 988 or mobile crisis in the event of a mental health crisis]                SDOH assessments and interventions completed:  No     Care Coordination Interventions:  Yes, provided   Follow up plan: No further intervention required.   Encounter Outcome:  Pt. Visit Completed

## 2022-07-22 NOTE — Patient Instructions (Signed)
Visit Information  Thank you for taking time to visit with me today. Please don't hesitate to contact me if I can be of assistance to you.   Following are the goals we discussed today:  Please follow up with the housing and mental health resources provided  If you are experiencing a Mental Health or Behavioral Health Crisis or need someone to talk to, please call the Suicide and Crisis Lifeline: 988   Patient verbalizes understanding of instructions and care plan provided today and agrees to view in MyChart. Active MyChart status and patient understanding of how to access instructions and care plan via MyChart confirmed with patient.     No further follow up required: patient to contact this Child psychotherapist with any additional community resource needs  [employer], LCSW Clinical Social Worker  [facility] Care Management [phone]

## 2022-07-27 ENCOUNTER — Telehealth: Payer: Self-pay | Admitting: *Deleted

## 2022-07-27 DIAGNOSIS — N2581 Secondary hyperparathyroidism of renal origin: Secondary | ICD-10-CM | POA: Diagnosis not present

## 2022-07-27 DIAGNOSIS — N184 Chronic kidney disease, stage 4 (severe): Secondary | ICD-10-CM | POA: Diagnosis not present

## 2022-07-27 DIAGNOSIS — N1832 Chronic kidney disease, stage 3b: Secondary | ICD-10-CM | POA: Diagnosis not present

## 2022-07-27 DIAGNOSIS — I10 Essential (primary) hypertension: Secondary | ICD-10-CM | POA: Diagnosis not present

## 2022-07-27 DIAGNOSIS — R600 Localized edema: Secondary | ICD-10-CM | POA: Diagnosis not present

## 2022-07-27 DIAGNOSIS — E1122 Type 2 diabetes mellitus with diabetic chronic kidney disease: Secondary | ICD-10-CM | POA: Diagnosis not present

## 2022-07-27 NOTE — Progress Notes (Signed)
  Care Coordination Note  07/27/2022 Name: SABREE ZAVALA MRN: [id number] DOB: 1965/03/05  Belia Heman Toole is a 57 y.o. year old male who is a primary care patient of Carren Rang, Cordelia Poche and is actively engaged with the care management team. I reached out to Andreas Newport by phone today to assist with re-scheduling a follow up visit with the RN Case Manager  Follow up plan: Unsuccessful telephone outreach attempt made. A HIPAA compliant phone message was left for the patient providing contact information and requesting a return call.   Burman Nieves, CCMA Care Coordination Care Guide Direct Dial: [phone]

## 2022-08-02 DIAGNOSIS — E1122 Type 2 diabetes mellitus with diabetic chronic kidney disease: Secondary | ICD-10-CM | POA: Insufficient documentation

## 2022-08-02 DIAGNOSIS — Z789 Other specified health status: Secondary | ICD-10-CM | POA: Insufficient documentation

## 2022-08-02 DIAGNOSIS — M899 Disorder of bone, unspecified: Secondary | ICD-10-CM | POA: Insufficient documentation

## 2022-08-02 DIAGNOSIS — Z79899 Other long term (current) drug therapy: Secondary | ICD-10-CM | POA: Insufficient documentation

## 2022-08-02 NOTE — Patient Instructions (Signed)
____________________________________________________________________________________________  New Patients  Welcome to [facility].   Initial Visit The first or initial visit consists of an evaluation only.   Interventional pain management.  We offer therapies other than opioid controlled substances to manage chronic pain. These include, but are not limited to, diagnostic, therapeutic, and palliative specialized injection therapies (i.e.: Epidural Steroids, Facet Blocks, etc.). We specialize in a variety of nerve blocks as well as radiofrequency treatments. We offer pain implant evaluations and trials, as well as follow up management. In addition we also provide a variety joint injections, including Viscosupplementation (AKA: Gel Therapy).  Prescription Pain Medication. We specialize in alternatives to opioids. We can provide evaluations and recommendations for/of pharmacologic therapies based on CDC Guidelines.  We no longer take patients for long-term medication management. We will not be taking over your pain medications.  ____________________________________________________________________________________________    ____________________________________________________________________________________________  Patient Information update  To: All of our patients.  Re: Name change.  It has been made official that our current name, "[facility]"   will soon be changed to "[facility]".   The purpose of this change is to eliminate any confusion created by the concept of our practice being a "Medication Management Pain Clinic". In the past this has led to the misconception that we treat pain primarily by the use of prescription medications.  Nothing can be farther from the truth.   Understanding PAIN MANAGEMENT: To  further understand what our practice does, you first have to understand that "Pain Management" is a subspecialty that requires additional training once a physician has completed their specialty training, which can be in either Anesthesia, Neurology, Psychiatry, or Physical Medicine and Rehabilitation (PMR). Each one of these contributes to the final approach taken by each physician to the management of their patient's pain. To be a "Pain Management Specialist" you must have first completed one of the specialty trainings below.  Anesthesiologists - trained in clinical pharmacology and interventional techniques such as nerve blockade and regional as well as central neuroanatomy. They are trained to block pain before, during, and after surgical interventions.  Neurologists - trained in the diagnosis and pharmacological treatment of complex neurological conditions, such as Multiple Sclerosis, Parkinson's, spinal cord injuries, and other systemic conditions that may be associated with symptoms that may include but are not limited to pain. They tend to rely primarily on the treatment of chronic pain using prescription medications.  Psychiatrist - trained in conditions affecting the psychosocial wellbeing of patients including but not limited to depression, anxiety, schizophrenia, personality disorders, addiction, and other substance use disorders that may be associated with chronic pain. They tend to rely primarily on the treatment of chronic pain using prescription medications.   Physical Medicine and Rehabilitation (PMR) physicians, also known as physiatrists - trained to treat a wide variety of medical conditions affecting the brain, spinal cord, nerves, bones, joints, ligaments, muscles, and tendons. Their training is primarily aimed at treating patients that have suffered injuries that have caused severe physical impairment. Their training is primarily aimed at the physical therapy and rehabilitation of those  patients. They may also work alongside orthopedic surgeons or neurosurgeons using their expertise in assisting surgical patients to recover after their surgeries.  INTERVENTIONAL PAIN MANAGEMENT is sub-subspecialty of Pain Management.  Our physicians are Board-certified in Anesthesia, Pain Management, and Interventional Pain Management.  This meaning that not only have they been trained   and Board-certified in their specialty of Anesthesia, and subspecialty of Pain Management, but they have also received further training in the sub-subspecialty of Interventional Pain Management, in order to become Board-certified as INTERVENTIONAL PAIN MANAGEMENT SPECIALIST.    Mission: Our goal is to use our skills in  INTERVENTIONAL PAIN MANAGEMENT as alternatives to the chronic use of prescription opioid medications for the treatment of pain. To make this more clear, we have changed our name to reflect what we do and offer. We will continue to offer medication management assessment and recommendations, but we will not be taking over any patient's medication management.  ____________________________________________________________________________________________     

## 2022-08-02 NOTE — Progress Notes (Unsigned)
Patient: [name]  Service Category: E/M  Provider: Oswaldo Done, MD  DOB: 07/06/1965  DOS: 08/03/2022  Referring Provider: Chesley Mires  MRN: [id number]  Setting: Ambulatory outpatient  PCP: Carren Rang, PA-C  Type: New Patient  Specialty: Interventional Pain Management    Location: Office  Delivery: Face-to-face     Primary Reason(s) for Visit: Encounter for initial evaluation of one or more chronic problems (new to examiner) potentially causing chronic pain, and posing a threat to normal musculoskeletal function. (Level of risk: High) CC: No chief complaint on file.  HPI  [name] is a 57 y.o. year old, male patient, who comes for the first time to our practice referred by Carren Rang, PA-C for our initial evaluation of his chronic pain. He has Cardiomyopathy, dilated, nonischemic (HCC); Morbid obesity with BMI of 50.0-59.9, adult (HCC); Gout; Benign hypertension with CKD (chronic kidney disease) stage IV (HCC); Proteinuria; CKD (chronic kidney disease), stage IV (HCC); Secondary renal hyperparathyroidism (HCC); Vitamin D deficiency; Elevated CO2 level; Tobacco abuse, episodic; Insomnia; Noncompliance; Allergic rhinitis; Chronic systolic heart failure (HCC); Hyperlipidemia associated with type 2 diabetes mellitus (HCC); Current tobacco use; Excessive daytime sleepiness; GERD (gastroesophageal reflux disease); Elevated transaminase level; Asthma; Primary osteoarthritis involving multiple joints; Chronic restrictive lung disease; Chronic pain syndrome; Background diabetic retinopathy (HCC); Acute kidney injury superimposed on CKD (HCC); Diarrhea; Volume overload; Hypotension; Hyponatremia; Thrombocytopenia (HCC); Pressure injury of skin; Laceration of left lower extremity; Anemia in stage 4 chronic kidney disease (HCC); Back pain; Macrocytic anemia; Hypoxia; Congestive heart failure (HCC); Cellulitis of right lower extremity; AMS (altered mental status); Hypercapnia;  Stress reaction causing mixed disturbance of emotion and conduct; Moderate episode of recurrent major depressive disorder (HCC); Edema of lower extremity; Acute kidney failure, unspecified (HCC); Essential hypertension; Hyperparathyroidism due to renal insufficiency (HCC); Pharmacologic therapy; Disorder of skeletal system; Problems influencing health status; and Type 2 diabetes mellitus with stage 3 chronic kidney disease, without long-term current use of insulin (HCC) on their problem list. Today he comes in for evaluation of his No chief complaint on file.  Pain Assessment: Location:     Radiating:   Onset:   Duration:   Quality:   Severity:  /10 (subjective, self-reported pain score)  Effect on ADL:   Timing:   Modifying factors:   BP:    HR:    Onset and Duration: {Hx; Onset and Duration:210120511} Cause of pain: {Hx; Cause:210120521} Severity: {Pain Severity:210120502} Timing: {Symptoms; Timing:210120501} Aggravating Factors: {Causes; Aggravating pain factors:210120507} Alleviating Factors: {Causes; Alleviating Factors:210120500} Associated Problems: {Hx; Associated problems:210120515} Quality of Pain: {Hx; Symptom quality or Descriptor:210120531} Previous Examinations or Tests: {Hx; Previous examinations or test:210120529} Previous Treatments: {Hx; Previous Treatment:210120503}  [name] is being evaluated for possible interventional pain management therapies for the treatment of his chronic pain.   ***  [name] has been informed that this initial visit was an evaluation only.  On the follow up appointment I will go over the results, including ordered tests and available interventional therapies. At that time he will have the opportunity to decide whether to proceed with offered therapies or not. In the event that [name] prefers avoiding interventional options, this will conclude our involvement in the case.  Medication management recommendations may be provided upon  request.  Historic Controlled Substance Pharmacotherapy Review  PMP and historical list of controlled substances: ***  Most recently prescribed opioid analgesics:   *** MME/day: *** mg/day  Historical Monitoring: The patient  reports no history of drug use.  List of prior UDS Testing: Lab Results  Component Value Date   MDMA NONE DETECTED 04/23/2022   MDMA NONE DETECTED 11/10/2021   MDMA NONE DETECTED 02/28/2018   COCAINSCRNUR negative 05/08/2022   COCAINSCRNUR NONE DETECTED 04/23/2022   COCAINSCRNUR NONE DETECTED 11/10/2021   COCAINSCRNUR NONE DETECTED 02/28/2018   PCPSCRNUR NONE DETECTED 04/23/2022   PCPSCRNUR NONE DETECTED 11/10/2021   PCPSCRNUR NONE DETECTED 02/28/2018   THCU NONE DETECTED 04/23/2022   THCU NONE DETECTED 11/10/2021   THCU NONE DETECTED 02/28/2018   ETH <10 11/10/2021   ETH <10 02/28/2018   Historical Background Evaluation: [place] PMP: PDMP reviewed during this encounter. Review of the past 23-months conducted.             PMP NARX Score Report:  Narcotic: 160 Sedative: 060 Stimulant: 000 [place] Department of public safety, offender search: Engineer, mining Information) Non-contributory Risk Assessment Profile: Aberrant behavior: None observed or detected today Risk factors for fatal opioid overdose: None identified today PMP NARX Overdose Risk Score: 320 Fatal overdose hazard ratio (HR): Calculation deferred Non-fatal overdose hazard ratio (HR): Calculation deferred Risk of opioid abuse or dependence: 0.7-3.0% with doses ? 36 MME/day and 6.1-26% with doses ? 120 MME/day. Substance use disorder (SUD) risk level: See below Personal History of Substance Abuse (SUD-Substance use disorder):  Alcohol:    Illegal Drugs:    Rx Drugs:    ORT Risk Level calculation:    ORT Scoring interpretation table:  Score <3 = Low Risk for SUD  Score between 4-7 = Moderate Risk for SUD  Score >8 = High Risk for Opioid Abuse   PHQ-2 Depression Scale:  Total score:    PHQ-2 Scoring  interpretation table: (Score and probability of major depressive disorder)  Score 0 = No depression  Score 1 = 15.4% Probability  Score 2 = 21.1% Probability  Score 3 = 38.4% Probability  Score 4 = 45.5% Probability  Score 5 = 56.4% Probability  Score 6 = 78.6% Probability   PHQ-9 Depression Scale:  Total score:    PHQ-9 Scoring interpretation table:  Score 0-4 = No depression  Score 5-9 = Mild depression  Score 10-14 = Moderate depression  Score 15-19 = Moderately severe depression  Score 20-27 = Severe depression (2.4 times higher risk of SUD and 2.89 times higher risk of overuse)   Pharmacologic Plan: As per protocol, I have not taken over any controlled substance management, pending the results of ordered tests and/or consults.            Initial impression: Pending review of available data and ordered tests.  Meds   Current Outpatient Medications:    albuterol (PROVENTIL) (2.5 MG/3ML) 0.083% nebulizer solution, Take 3 mLs (2.5 mg total) by nebulization every 6 (six) hours as needed for wheezing or shortness of breath., Disp: 150 mL, Rfl: 3   albuterol (VENTOLIN HFA) 108 (90 Base) MCG/ACT inhaler, Inhale 2 puffs into the lungs every 4 (four) hours as needed for wheezing or shortness of breath., Disp: 18 g, Rfl: 3   allopurinol (ZYLOPRIM) 100 MG tablet, Take 2 tablets (200 mg total) by mouth daily., Disp: 180 tablet, Rfl: 0   atorvastatin (LIPITOR) 20 MG tablet, Take 1 tablet (20 mg total) by mouth daily., Disp: 90 tablet, Rfl: 3   blood glucose meter kit and supplies KIT, Brand of choice; LON 99 months; check FSBS 3x a day; E11.65; disp one meter with 100 strips + 1 refill of strips, Disp: 1 each, Rfl: 0  Blood Glucose Monitoring Suppl (ONE TOUCH ULTRA 2) w/Device KIT, Use to check blood glucose up to 3 x daily, Disp: 1 kit, Rfl: 0   bumetanide (BUMEX) 1 MG tablet, Take by mouth. Take 2 tablets (2 mg total) by mouth in the morning and 2 tablets (2 mg total) at noon and 2 tablets (2  mg total) in the evening., Disp: , Rfl:    calcitRIOL (ROCALTROL) 0.5 MCG capsule, Take 2 capsules (1 mcg total) by mouth 1 (one) time each day, Disp: , Rfl:    carvedilol (COREG) 25 MG tablet, Take 2 tablets (50 mg total) by mouth 2 (two) times daily with a meal., Disp: 360 tablet, Rfl: 1   cyanocobalamin (VITAMIN B12) 1000 MCG tablet, Take 1 tablet (1,000 mcg total) by mouth daily., Disp: 90 tablet, Rfl: 1   cyclobenzaprine (FLEXERIL) 10 MG tablet, Take 1 tablet (10 mg total) by mouth 3 (three) times daily as needed for muscle spasms., Disp: 60 tablet, Rfl: 2   dicyclomine (BENTYL) 10 MG capsule, Take 1 capsule (10 mg total) by mouth 3 (three) times daily before meals., Disp: 90 capsule, Rfl: 0   esomeprazole (NEXIUM) 40 MG capsule, Take 1 capsule (40 mg total) by mouth 2 (two) times daily before a meal., Disp: 180 capsule, Rfl: 0   ferrous gluconate (FERGON) 324 MG tablet, Take 1 tablet (324 mg total) by mouth daily with breakfast., Disp: 100 tablet, Rfl: 0   FLUoxetine (PROZAC) 10 MG capsule, Take 1 capsule (10 mg total) by mouth daily., Disp: 30 capsule, Rfl: 0   fluticasone (FLONASE) 50 MCG/ACT nasal spray, Place 2 sprays into both nostrils daily. Use for 4-6 weeks then stop and use seasonally or as needed., Disp: 16 g, Rfl: 3   gabapentin (NEURONTIN) 100 MG capsule, , Disp: , Rfl:    hydrocortisone 1 % ointment, Apply 1 Application topically 2 (two) times daily., Disp: 30 g, Rfl: 0   lidocaine (LIDODERM) 5 %, Place 1 patch onto the skin every 12 (twelve) hours. Remove & Discard patch within 12 hours or as directed by MD, Disp: 10 patch, Rfl: 0   metolazone (ZAROXOLYN) 5 MG tablet, Take 1 tablet (5 mg total) by mouth 1 (one) time each day if needed (for weight gain), Disp: , Rfl:    naloxegol oxalate (MOVANTIK) 12.5 MG TABS tablet, Take 1 tablet (12.5 mg total) by mouth daily., Disp: 30 tablet, Rfl: 0   OneTouch Delica Lancets 33G MISC, Use to check blood sugar 3 x daily, Disp: 300 each, Rfl:  3   ONETOUCH ULTRA test strip, Check blood sugar 3 x daily, Disp: 300 each, Rfl: 3   oxyCODONE (OXY IR/ROXICODONE) 5 MG immediate release tablet, Take 1 tablet (5 mg total) by mouth every 6 (six) hours as needed for severe pain., Disp: 20 tablet, Rfl: 0   potassium chloride (KLOR-CON) 10 MEQ tablet, Take 1 tablet (10 mEq total) by mouth daily., Disp: 30 tablet, Rfl: 2   simethicone (MYLICON) 125 MG chewable tablet, Chew 125 mg by mouth every 6 (six) hours as needed for flatulence., Disp: , Rfl:   Imaging Review  Cervical Imaging: Cervical CT wo contrast: Results for orders placed during the hospital encounter of 04/23/22 CT Cervical Spine Wo Contrast  Narrative CLINICAL DATA:  Neck trauma, focal neuro deficit or paresthesia (Age 42-64y); Headache, fever  EXAM: CT HEAD WITHOUT CONTRAST  CT CERVICAL SPINE WITHOUT CONTRAST  TECHNIQUE: Multidetector CT imaging of the head and cervical spine was performed following  the standard protocol without intravenous contrast. Multiplanar CT image reconstructions of the cervical spine were also generated.  RADIATION DOSE REDUCTION: This exam was performed according to the departmental dose-optimization program which includes automated exposure control, adjustment of the mA and/or kV according to patient size and/or use of iterative reconstruction technique.  COMPARISON:  CT head and C-spine 11/10/2021.  FINDINGS: CT HEAD FINDINGS  Brain:  No evidence of large-territorial acute infarction. No parenchymal hemorrhage. No mass lesion. No extra-axial collection.  No mass effect or midline shift. No hydrocephalus. Basilar cisterns are patent. Cavum septum pellucidum variant noted.  Vascular: No hyperdense vessel.  Skull: No acute fracture or focal lesion.  Sinuses/Orbits: Paranasal sinuses and mastoid air cells are clear. The orbits are unremarkable.  Other: None.  CT CERVICAL SPINE FINDINGS  Alignment: Reversal of normal cervical  lordosis likely due to positioning.  Skull base and vertebrae: Slightly limited evaluation of the mid cervical spine due to quantum mottle artifact. Degenerative changes most prominent at the C1-C2 level. No acute fracture. No aggressive appearing focal osseous lesion or focal pathologic process.  Soft tissues and spinal canal: No prevertebral fluid or swelling. No visible canal hematoma.  Upper chest: Unremarkable.  Other: Subacute to chronic appearing bilateral posterior first rib fractures.  IMPRESSION: 1. No acute intracranial abnormality. 2. No acute displaced fracture or traumatic listhesis of the cervical spine.   Electronically Signed By: Tish Frederickson M.D. On: 04/23/2022 19:18  Complexity Note: Imaging results reviewed.                         ROS  Cardiovascular: {Hx; Cardiovascular History:210120525} Pulmonary or Respiratory: {Hx; Pumonary and/or Respiratory History:210120523} Neurological: {Hx; Neurological:210120504} Psychological-Psychiatric: {Hx; Psychological-Psychiatric History:210120512} Gastrointestinal: {Hx; Gastrointestinal:210120527} Genitourinary: {Hx; Genitourinary:210120506} Hematological: {Hx; Hematological:210120510} Endocrine: {Hx; Endocrine history:210120509} Rheumatologic: {Hx; Rheumatological:210120530} Musculoskeletal: {Hx; Musculoskeletal:210120528} Work History: {Hx; Work history:210120514}  Allergies  [name] is allergic to talc.  Laboratory Chemistry Profile   Renal Lab Results  Component Value Date   BUN 39 (H) 05/11/2022   CREATININE 2.51 (H) 05/11/2022   LABCREA 53 03/16/2022   BCR 17 05/08/2022   GFRAA 48 (L) 10/05/2018   GFRNONAA 29 (L) 05/11/2022   PROTEINUR 100 (A) 03/24/2022     Electrolytes Lab Results  Component Value Date   NA 141 05/11/2022   K 4.1 05/11/2022   CL 105 05/11/2022   CALCIUM 8.7 (L) 05/11/2022   MG 2.2 04/29/2022   PHOS 4.6 04/27/2022     Hepatic Lab Results  Component Value Date    AST 23 05/11/2022   ALT 17 05/11/2022   ALBUMIN 2.8 (L) 05/11/2022   ALKPHOS 173 (H) 05/11/2022   AMYLASE 113 (H) 05/08/2022   LIPASE 49 05/08/2022   AMMONIA 64 (H) 04/25/2022     ID Lab Results  Component Value Date   HIV Non Reactive 11/10/2021   SARSCOV2NAA NEGATIVE 11/10/2021   MRSAPCR NEGATIVE 02/28/2018   HCVAB Non Reactive (A) 11/12/2021     Bone Lab Results  Component Value Date   VD25OH 30 08/06/2015     Endocrine Lab Results  Component Value Date   GLUCOSE 135 (H) 05/11/2022   GLUCOSEU NEGATIVE 03/24/2022   HGBA1C 6.2 (H) 03/16/2022   TSH 0.977 04/24/2022     Neuropathy Lab Results  Component Value Date   VITAMINB12 2,462 (H) 02/27/2022   FOLATE 18.6 02/27/2022   HGBA1C 6.2 (H) 03/16/2022   HIV Non Reactive 11/10/2021     CNS  No results found for: "COLORCSF", "APPEARCSF", "RBCCOUNTCSF", "WBCCSF", "POLYSCSF", "LYMPHSCSF", "EOSCSF", "PROTEINCSF", "GLUCCSF", "JCVIRUS", "CSFOLI", "IGGCSF", "LABACHR", "ACETBL"   Inflammation (CRP: Acute  ESR: Chronic) Lab Results  Component Value Date   LATICACIDVEN 1.1 04/24/2022     Rheumatology Lab Results  Component Value Date   LABURIC 6.3 05/29/2021     Coagulation Lab Results  Component Value Date   INR 1.20 02/28/2018   LABPROT 15.1 02/28/2018   APTT 26 10/04/2016   PLT 136 (L) 05/11/2022     Cardiovascular Lab Results  Component Value Date   BNP 1,785.1 (H) 04/23/2022   CKTOTAL 52 04/24/2022   TROPONINI <0.03 02/28/2018   HGB 10.8 (L) 05/11/2022   HCT 38.2 (L) 05/11/2022     Screening Lab Results  Component Value Date   SARSCOV2NAA NEGATIVE 11/10/2021   MRSAPCR NEGATIVE 02/28/2018   HCVAB Non Reactive (A) 11/12/2021   HIV Non Reactive 11/10/2021     Cancer No results found for: "CEA", "CA125", "LABCA2"   Allergens No results found for: "ALMOND", "APPLE", "ASPARAGUS", "AVOCADO", "BANANA", "BARLEY", "BASIL", "BAYLEAF", "GREENBEAN", "LIMABEAN", "WHITEBEAN", "BEEFIGE", "REDBEET",  "BLUEBERRY", "BROCCOLI", "CABBAGE", "MELON", "CARROT", "CASEIN", "CASHEWNUT", "CAULIFLOWER", "CELERY"     Note: Lab results reviewed.  PFSH  Drug: [name]  reports no history of drug use. Alcohol:  reports current alcohol use. Tobacco:  reports that he has never smoked. He has never used smokeless tobacco. Medical:  has a past medical history of Allergy, Asthma, Diabetes mellitus without complication (HCC), Gout, Hyperlipidemia, Hypertension, and Morbid obesity (HCC). Family: family history includes Alcohol abuse in his father; Diabetes in his father and mother; Glaucoma in his father; Hypertension in his father and mother.  Past Surgical History:  Procedure Laterality Date   DIALYSIS/PERMA CATHETER INSERTION N/A 04/29/2022   Procedure: DIALYSIS/PERMA CATHETER INSERTION;  Surgeon: Annice Needy, MD;  Location: [facility];  Service: Cardiovascular;  Laterality: N/A;   DIALYSIS/PERMA CATHETER REMOVAL N/A 07/06/2022   Procedure: DIALYSIS/PERMA CATHETER REMOVAL;  Surgeon: Annice Needy, MD;  Location: [facility];  Service: Cardiovascular;  Laterality: N/A;   TEMPORARY DIALYSIS CATHETER Right 11/14/2021   Procedure: TEMPORARY DIALYSIS CATHETER;  Surgeon: Annice Needy, MD;  Location: [facility];  Service: Cardiovascular;  Laterality: Right;   TEMPORARY DIALYSIS CATHETER N/A 04/24/2022   Procedure: TEMPORARY DIALYSIS CATHETER;  Surgeon: Renford Dills, MD;  Location: [facility];  Service: Cardiovascular;  Laterality: N/A;   Active Ambulatory Problems    Diagnosis Date Noted   Cardiomyopathy, dilated, nonischemic (HCC)    Morbid obesity with BMI of 50.0-59.9, adult (HCC) 08/02/2022   Gout    Benign hypertension with CKD (chronic kidney disease) stage IV (HCC) 07/10/2014   Proteinuria 07/10/2014   CKD (chronic kidney disease), stage IV (HCC) 07/10/2014   Secondary renal hyperparathyroidism (HCC) 07/10/2014   Vitamin D deficiency 01/17/2015   Elevated  CO2 level 01/17/2015   Tobacco abuse, episodic 01/17/2015   Insomnia 01/17/2015   Noncompliance 04/09/2015   Allergic rhinitis 04/09/2015   Chronic systolic heart failure (HCC) 04/29/2011   Hyperlipidemia associated with type 2 diabetes mellitus (HCC) 02/15/2015   Current tobacco use 01/11/2015   Excessive daytime sleepiness 05/07/2015   GERD (gastroesophageal reflux disease) 05/07/2015   Elevated transaminase level 06/04/2015   Asthma 09/03/2015   Primary osteoarthritis involving multiple joints 01/24/2016   Chronic restrictive lung disease 02/19/2016   Chronic pain syndrome 01/21/2017   Background diabetic retinopathy (HCC) 10/17/2018   Acute kidney injury  superimposed on CKD (HCC) 11/10/2021   Diarrhea 11/10/2021   Volume overload 11/10/2021   Hypotension 11/11/2021   Hyponatremia 11/11/2021   Thrombocytopenia (HCC) 11/11/2021   Pressure injury of skin 11/12/2021   Laceration of left lower extremity 11/24/2021   Anemia in stage 4 chronic kidney disease (HCC) 12/15/2021   Back pain 12/15/2021   Macrocytic anemia 12/15/2021   Hypoxia 03/02/2022   Congestive heart failure (HCC) 08/19/2020   Cellulitis of right lower extremity 04/20/2022   AMS (altered mental status) 04/23/2022   Hypercapnia 04/24/2022   Stress reaction causing mixed disturbance of emotion and conduct 04/24/2022   Moderate episode of recurrent major depressive disorder (HCC) 05/14/2022   Edema of lower extremity 06/18/2022   Acute kidney failure, unspecified (HCC) 06/18/2022   Essential hypertension 06/18/2022   Hyperparathyroidism due to renal insufficiency (HCC) 11/28/2018   Pharmacologic therapy 08/02/2022   Disorder of skeletal system 08/02/2022   Problems influencing health status 08/02/2022   Type 2 diabetes mellitus with stage 3 chronic kidney disease, without long-term current use of insulin (HCC) 08/02/2022   Resolved Ambulatory Problems    Diagnosis Date Noted   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 In no apparent acute distress There were no vitals filed for this visit. BMI Assessment: Estimated body mass index is 49.79 kg/m as calculated from the following:   Height as of 07/06/22: 5\' 11"  (1.803 m).   Weight as of 07/06/22: 357 lb (161.9 kg).  BMI interpretation table: BMI level Category Range association with higher incidence of chronic pain  <18 kg/m2 Underweight   18.5-24.9 kg/m2 Ideal body weight   25-29.9 kg/m2 Overweight Increased incidence by 20%  30-34.9 kg/m2 Obese (Class I) Increased incidence by 68%  35-39.9 kg/m2 Severe obesity (Class II) Increased incidence by 136%  >40 kg/m2 Extreme obesity (Class III) Increased incidence by 254%   Patient's current BMI Ideal Body weight  There is no height or weight on file to calculate BMI. Patient weight not recorded   BMI Readings from Last 4 Encounters:  07/06/22 49.79 kg/m  05/17/22 50.91 kg/m  05/11/22 54.45 kg/m  05/08/22 53.22 kg/m   Wt Readings from Last 4 Encounters:  07/06/22 (!) 357 lb (161.9 kg)  05/17/22 (!) 365 lb (165.6 kg)  05/11/22 (!) 390 lb 7 oz (177.1 kg)  04/30/22 (!) 381 lb 9.9 oz (173.1 kg)    Psych/Mental status: Alert, oriented x 3 (person, place, & time)       Eyes: PERLA Respiratory: No evidence of acute respiratory distress  Assessment  Primary Diagnosis & Pertinent Problem List: The primary encounter diagnosis was Chronic pain syndrome. Diagnoses of Pharmacologic therapy, Disorder of skeletal system, Problems influencing health status, Type 2 diabetes mellitus with stage 3 chronic kidney disease, without long-term current use of insulin, unspecified whether stage 3a  or 3b CKD (HCC), and Morbid obesity with BMI of 50.0-59.9, adult [facility]) were also pertinent to this visit.  Visit Diagnosis (New problems to examiner): 1. Chronic pain syndrome   2. Pharmacologic therapy   3. Disorder of skeletal system   4. Problems influencing health status   5. Type 2 diabetes mellitus with stage  3 chronic kidney disease, without long-term current use of insulin, unspecified whether stage 3a or 3b CKD (HCC)   6. Morbid obesity with BMI of 50.0-59.9, adult (HCC)    Plan of Care (Initial workup plan)  Note: [name] was reminded that as per protocol, today's visit has been an evaluation only. We have not taken over the patient's controlled substance management.  Problem-specific plan: No problem-specific Assessment & Plan notes found for this encounter.  Lab Orders  No laboratory test(s) ordered today   Imaging Orders  No imaging studies ordered today   Referral Orders  No referral(s) requested today   Procedure Orders    No procedure(s) ordered today   Pharmacotherapy (current): Medications ordered:  No orders of the defined types were placed in this encounter.  Medications administered during this visit: [name] had no medications administered during this visit.   Analgesic Pharmacotherapy:  Opioid Analgesics: For patients currently taking or requesting to take opioid analgesics, in accordance with [facility] Guidelines, we will assess their risks and indications for the use of these substances. After completing our evaluation, we may offer recommendations, but we no longer take patients for medication management. The prescribing physician will ultimately decide, based on his/her training and level of comfort whether to adopt any of the recommendations, including whether or not to prescribe such medicines.  Membrane stabilizer: To be determined at a later time  Muscle relaxant: To be determined at a later time  NSAID: To be  determined at a later time  Other analgesic(s): To be determined at a later time   Interventional management options: [name] was informed that there is no guarantee that he would be a candidate for interventional therapies. The decision will be based on the results of diagnostic studies, as well as [name] risk profile.  Procedure(s) under consideration:  Pending results of ordered studies      Interventional Therapies  Risk Factors  Considerations  Medical Comorbidities:     Planned  Pending:      Under consideration:   Pending   Completed:   None at this time   Therapeutic  Palliative (PRN) options:   None established   Completed by other providers:   None reported       Provider-requested follow-up: No follow-ups on file.  Future Appointments  Date Time Provider Department Center  08/03/2022 10:00 AM Delano Metz, MD [facility] None  08/28/2022 11:00 AM [facility] None  09/01/2022  2:30 PM [name], MD [facility] None  09/01/2022  2:45 PM [facility] [facility]IR 2 [facility] None  01/12/2023 10:40 AM Larae Grooms, NP [facility]    Duration of encounter: *** minutes.  Total time on encounter, as per AMA guidelines included both the face-to-face and non-face-to-face time personally spent by the physician and/or other qualified health care professional(s) on the day of the encounter (includes time in activities that require the physician or other qualified health care professional and does not include time in activities normally performed by clinical staff). Physician's time may include the following activities when performed: Preparing to see the patient (e.g., pre-charting review of records, searching for previously ordered imaging, lab work, and nerve conduction tests) Review of prior analgesic pharmacotherapies. Reviewing PMP Interpreting ordered tests (e.g., lab work, imaging, nerve conduction tests) Performing post-procedure evaluations,  including interpretation of diagnostic procedures Obtaining and/or reviewing separately obtained history Performing a medically appropriate examination and/or evaluation Counseling and educating the patient/family/caregiver Ordering medications,  tests, or procedures Referring and communicating with other health care professionals (when not separately reported) Documenting clinical information in the electronic or other health record Independently interpreting results (not separately reported) and communicating results to the patient/ family/caregiver Care coordination (not separately reported)  Note by: Oswaldo Done, MD (TTS technology used. I apologize for any typographical errors that were not detected and corrected.) Date: 08/03/2022; Time: 9:56 AM

## 2022-08-03 ENCOUNTER — Ambulatory Visit (HOSPITAL_BASED_OUTPATIENT_CLINIC_OR_DEPARTMENT_OTHER): Payer: PPO | Admitting: Pain Medicine

## 2022-08-03 DIAGNOSIS — Z79899 Other long term (current) drug therapy: Secondary | ICD-10-CM

## 2022-08-03 DIAGNOSIS — M899 Disorder of bone, unspecified: Secondary | ICD-10-CM

## 2022-08-03 DIAGNOSIS — G894 Chronic pain syndrome: Secondary | ICD-10-CM

## 2022-08-03 DIAGNOSIS — Z91199 Patient's noncompliance with other medical treatment and regimen due to unspecified reason: Secondary | ICD-10-CM

## 2022-08-03 DIAGNOSIS — E1122 Type 2 diabetes mellitus with diabetic chronic kidney disease: Secondary | ICD-10-CM

## 2022-08-03 DIAGNOSIS — Z789 Other specified health status: Secondary | ICD-10-CM

## 2022-08-07 ENCOUNTER — Other Ambulatory Visit: Payer: Self-pay

## 2022-08-07 ENCOUNTER — Emergency Department
Admission: EM | Admit: 2022-08-07 | Discharge: 2022-08-08 | Disposition: A | Discharge status: Home / Self Care | Payer: PPO | Attending: Emergency Medicine | Admitting: Emergency Medicine

## 2022-08-07 DIAGNOSIS — E119 Type 2 diabetes mellitus without complications: Secondary | ICD-10-CM | POA: Insufficient documentation

## 2022-08-07 DIAGNOSIS — I10 Essential (primary) hypertension: Secondary | ICD-10-CM | POA: Diagnosis not present

## 2022-08-07 DIAGNOSIS — F4325 Adjustment disorder with mixed disturbance of emotions and conduct: Secondary | ICD-10-CM | POA: Insufficient documentation

## 2022-08-07 DIAGNOSIS — J45909 Unspecified asthma, uncomplicated: Secondary | ICD-10-CM | POA: Diagnosis not present

## 2022-08-07 DIAGNOSIS — F4329 Adjustment disorder with other symptoms: Secondary | ICD-10-CM | POA: Diagnosis not present

## 2022-08-07 DIAGNOSIS — F10129 Alcohol abuse with intoxication, unspecified: Secondary | ICD-10-CM | POA: Diagnosis present

## 2022-08-07 DIAGNOSIS — F331 Major depressive disorder, recurrent, moderate: Secondary | ICD-10-CM | POA: Diagnosis not present

## 2022-08-07 DIAGNOSIS — F430 Acute stress reaction: Secondary | ICD-10-CM | POA: Diagnosis present

## 2022-08-07 DIAGNOSIS — M545 Low back pain, unspecified: Secondary | ICD-10-CM | POA: Diagnosis not present

## 2022-08-07 DIAGNOSIS — G4700 Insomnia, unspecified: Secondary | ICD-10-CM | POA: Insufficient documentation

## 2022-08-07 DIAGNOSIS — M549 Dorsalgia, unspecified: Secondary | ICD-10-CM | POA: Diagnosis not present

## 2022-08-07 DIAGNOSIS — F29 Unspecified psychosis not due to a substance or known physiological condition: Secondary | ICD-10-CM | POA: Diagnosis not present

## 2022-08-07 DIAGNOSIS — F32A Depression, unspecified: Secondary | ICD-10-CM

## 2022-08-07 DIAGNOSIS — G8929 Other chronic pain: Secondary | ICD-10-CM | POA: Diagnosis not present

## 2022-08-07 DIAGNOSIS — R0902 Hypoxemia: Secondary | ICD-10-CM | POA: Diagnosis not present

## 2022-08-07 LAB — URINE DRUG SCREEN, QUALITATIVE (ARMC ONLY)
Amphetamines, Ur Screen: NOT DETECTED
Barbiturates, Ur Screen: NOT DETECTED
Benzodiazepine, Ur Scrn: NOT DETECTED
Cannabinoid 50 Ng, Ur ~~LOC~~: NOT DETECTED
Cocaine Metabolite,Ur ~~LOC~~: NOT DETECTED
MDMA (Ecstasy)Ur Screen: NOT DETECTED
Methadone Scn, Ur: NOT DETECTED
Opiate, Ur Screen: NOT DETECTED
Phencyclidine (PCP) Ur S: NOT DETECTED
Tricyclic, Ur Screen: NOT DETECTED

## 2022-08-07 MED ORDER — LORAZEPAM 1 MG PO TABS: 1.0000 mg | ORAL_TABLET | Freq: Three times a day (TID) | ORAL | Status: DC | PRN

## 2022-08-07 MED ORDER — ATORVASTATIN CALCIUM 20 MG PO TABS: 20.0000 mg | ORAL_TABLET | Freq: Every day | ORAL | Status: DC

## 2022-08-07 MED ORDER — BUMETANIDE 1 MG PO TABS
2.0000 mg | ORAL_TABLET | Freq: Three times a day (TID) | ORAL | Status: DC
  Administered 2022-08-07: 1 mg via ORAL
  Administered 2022-08-08: 2 mg via ORAL
  Filled 2022-08-07 (×4): qty 2

## 2022-08-07 MED ORDER — FLUOXETINE HCL 10 MG PO CAPS
10.0000 mg | ORAL_CAPSULE | Freq: Every day | ORAL | Status: DC
  Filled 2022-08-07 (×2): qty 1

## 2022-08-07 MED ORDER — PANTOPRAZOLE SODIUM 40 MG PO TBEC: 80.0000 mg | DELAYED_RELEASE_TABLET | Freq: Every day | ORAL | Status: DC

## 2022-08-07 MED ORDER — CYCLOBENZAPRINE HCL 10 MG PO TABS
5.0000 mg | ORAL_TABLET | Freq: Once | ORAL | Status: AC
  Administered 2022-08-07: 5 mg via ORAL
  Filled 2022-08-07: qty 1

## 2022-08-07 MED ORDER — ALLOPURINOL 100 MG PO TABS
200.0000 mg | ORAL_TABLET | Freq: Every day | ORAL | Status: DC
  Administered 2022-08-07 – 2022-08-08 (×2): 200 mg via ORAL
  Filled 2022-08-07 (×2): qty 2

## 2022-08-07 MED ORDER — CARVEDILOL 25 MG PO TABS
50.0000 mg | ORAL_TABLET | Freq: Two times a day (BID) | ORAL | Status: DC
  Administered 2022-08-08: 50 mg via ORAL
  Filled 2022-08-07: qty 2

## 2022-08-07 MED ORDER — LORAZEPAM 2 MG PO TABS: 2.0000 mg | ORAL_TABLET | Freq: Once | ORAL | Status: DC

## 2022-08-07 MED ORDER — DICYCLOMINE HCL 10 MG PO CAPS: 10.0000 mg | ORAL_CAPSULE | Freq: Three times a day (TID) | ORAL | Status: DC

## 2022-08-07 NOTE — ED Notes (Signed)
Pt becoming very agitated and raising voice while speaking to TTS in regard to observation overnight. Pt stating he is going home. Pt refusing to dress out. Refusing to take prn PO ativan. Refusing to move from w/c.

## 2022-08-07 NOTE — ED Notes (Signed)
Pt eating breakfast 

## 2022-08-07 NOTE — ED Provider Notes (Signed)
-----------------------------------------   12:27 PM on 08/07/2022 ----------------------------------------- Patient has been seen and evaluated by psychiatry.  They have spoken to being state that the patient seems to be fairly preoccupied with the idea of his mother dying although denies any active plan to kill her.  He also admits to alcohol intoxication.  They do not believe the patient is safe to be discharged home in his current state and recommend that he be IVC does the patient attempted to leave the emergency department.  I placed the patient under IVC we will have psychiatry continue to monitor.   Minna Antis, MD 08/07/22 1228

## 2022-08-07 NOTE — ED Notes (Signed)
Pt belongings include: 1 blue shirt, 2 necklace, 2 shoes, 2 socks, 2 wallet, 1 set of keys, & 1 phone

## 2022-08-07 NOTE — ED Notes (Signed)
Pt provided with phone for over an hour. When asked for phone back, pt states "you told her didn't you, good job" referring to security asking for phone back around 5 minutes ago

## 2022-08-07 NOTE — ED Notes (Signed)
Pt ambulating to restroom and doorway independently without cane assistance at this time. Pt with steady gait at this time.

## 2022-08-07 NOTE — ED Notes (Signed)
Patient has noted that he isn't going to kill her, but he wants his mother to die.  He has also asked if [place] in water could kill someone.
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
## 2022-08-07 NOTE — Consult Note (Signed)
Telepsych Consultation   Reason for Consult:  "Depression" Referring Physician:  Hal Hope Location of Patient:    [facility] Location of Provider: Other: virtual home office  Patient Identification: [name] MRN:  [id number] Principal Diagnosis: Adjustment disorder with emotional disturbance Diagnosis:  Principal Problem:   Adjustment disorder with emotional disturbance Active Problems:   Insomnia   Moderate episode of recurrent major depressive disorder (HCC)   Total Time spent with patient: 45 minutes  Subjective:   [name] is a 57 y.o. male patient admitted with  Per RN Triage Note: "BIB [facility] from home. Pt reports back pain x 4 months. Denies trauma or injury. Pt alert and oriented on arrival. Pt reports he initially called EMS d/t his house being hot. Pt also reports depression d/t recent life events. Denies SI or HI. Pt alert and oriented following commands. Breathing unlabored speaking in full sentences."  HPI:   Patient seen via telepsych by this provider; chart reviewed and consulted with Dr. Viviano Simas on 08/07/22.  On evaluation [name] is greeted by this Clinical research associate and given anticipatory guidance.  He agrees to assessment.  He is alert and oriented x4; He's sitting in a wheel chair,  observed transferring to sit on the bench. He is overweight, moves slow, noted delay from the time he's asked to perform a task until he's able to execute it. It appears he has to think about the task before completing it. There are no grooming deficits observed; he make fair eye contact.  His speech is slurred and at times he's asked to repeat responses for clarity.   Pt reports at baseline he has chronic back pain, kidney problems,  obesity and back pain that makes it difficult to walk.  His concerns are exacerbated his by environmental concerns.  He reports he lives with his 67 year old mother, whom he believes intentionally keeps the temperature in the home "hot" and will  not turn the air conditioner on. He states the temperature climate has made it difficult to sleep, reports getting about 1 hour of restful sleep daily over the past 4 months.  Pt report the heat has made it hard to sleep and he mostly tosses and turns all most of the night.  He reports his mounting frustration regarding this, " I wish she would just die; I have prayed to God about this and she's still here. I can't stand her.  She wears a medic alert around her neck, I wish she would trip and fall and it not go off."  When asked if he had a desire to kill his mother patient reported his reluctance to answer the question citing he did not want to be "tricked" into saying things.  He additionally expresses his desire for God to place his mother in a situation where he has the upper hand; patient verbalizes his intent to willfully mistreat her should the above circumstance ever present itself.  Patient then states he would not hurt his mother and uses the analogy if a rapists harmed his daughter, he would want to kill him because he understands d/t laws in place he cannot. He relates this is how he feels about his mother.  Patient is very circumstantial. For most of the assessment he's allowed to express his concerns with infrequent interruptions as he voiced his disdain with being interrupted to gain clarification.   He reports a long standing hx for depressive symptoms, was previously prescribed fluoxetine 10mg  po daily but states  he never took it.  He reports he's too busy to take medications that could make him sleepy.  He denies hx of self injurious behaviors; denies hx of  prior suicide attempt.  He is guarded about his alcoholic consumption but then reports drinking daily; He reports drinking 60 oz of [employer] prior to admission. States he's on a 80oz daily fluid restriction so he could not drink more. He denies illicit drug usage; used to take tramadol and oxycodone for chronic pain but does not  currently.  PDMP validates last oxycodone and tramadol prescriptions were filled 12/2021.     Today he reports passive suicidal ideations but no plan or intent to end his life.  Pt reports, "it takes a lot of guts to do that and I do not have the guts to do that." He spontaneously adds, "when people drink alcohol they can do things they couldn't do when they are not drinking."  He lives with his mother and states he has an adult daughter but does not give permission for this writer to contact anyone for collateral.   Labs: There were no updates labs completed at the time of admission.  Most recent labs were completed April 2024.   During evaluation [name] is seen sitting on bench in a what appears to be triage or supply room; He is fairly groomed, wearing street clothes, no ADL deficits observed.  He is observed to be obese and ambulates slow but steady with use of a cane. He  is alert/oriented x 4; his presentation is dysphoric, irritable and minimally cooperative; and mood congruent with affect.  Patient is very talkative but does not appear manic; his his speech is slurred but understandable; moderate toe and pace. He makes fair eye contact.  His thought process is relevant; There is no indication that he is currently responding to internal/external stimuli or experiencing delusional thought content.  Patient denies suicidal/self-harm; has passive homicidal ideations towards his mother but states he would not hurt her.  He appears inebriated but does not appear psychotic or paranoia.  Patient has remained attempted to participate in assessment but does appear limited.     Per ED Provider Admission Assessment 08/07/2022@0603 : History    Back Pain and Depression     HPI   [name] is a 57 y.o. male who presented to the emergency department today via EMS because of primary concern for back pain.  He states that he has history of back pain and had been on tramadol although since  switching doctors he has run out.  He is currently living with his mother and has been sleeping on a bed mattress for the past 4 months which she states has additionally apparently his mother keeps the temperature in his house very high.  He has been having some depression.  Will not answer whether or not he has been having thoughts of wanting to harm himself.   Past Psychiatric History: Depression  Risk to Self:  denies Risk to Others:  denies but he has passive HI towards his 25 year old mother whom he currently resides with.  He endorses alcohol use prior to admission, his speech is slurred and his presentation is dysphoric.  Unsure if his presentation is related to blood sugar concerns or verbalized alcohol usage.  As he appears impaired today, he is deemed a safety concern.  Prior Inpatient Therapy:  yes Prior Outpatient Therapy:  denies  Past Medical History:  Past Medical History:  Diagnosis Date   Allergy    Asthma    Diabetes mellitus without complication (HCC)    Gout    Hyperlipidemia    Hypertension    Morbid obesity (HCC)     Past Surgical History:  Procedure Laterality Date   DIALYSIS/PERMA CATHETER INSERTION N/A 04/29/2022   Procedure: DIALYSIS/PERMA CATHETER INSERTION;  Surgeon: Annice Needy, MD;  Location: [facility] INVASIVE CV LAB;  Service: Cardiovascular;  Laterality: N/A;   DIALYSIS/PERMA CATHETER REMOVAL N/A 07/06/2022   Procedure: DIALYSIS/PERMA CATHETER REMOVAL;  Surgeon: Annice Needy, MD;  Location: [facility] INVASIVE CV LAB;  Service: Cardiovascular;  Laterality: N/A;   TEMPORARY DIALYSIS CATHETER Right 11/14/2021   Procedure: TEMPORARY DIALYSIS CATHETER;  Surgeon: Annice Needy, MD;  Location: [facility] INVASIVE CV LAB;  Service: Cardiovascular;  Laterality: Right;   TEMPORARY DIALYSIS CATHETER N/A 04/24/2022   Procedure: TEMPORARY DIALYSIS CATHETER;  Surgeon: Renford Dills, MD;  Location: [facility] INVASIVE CV LAB;  Service: Cardiovascular;  Laterality: N/A;   Family History:   Family History  Problem Relation Age of Onset   Diabetes Mother    Hypertension Mother    Alcohol abuse Father    Glaucoma Father    Diabetes Father    Hypertension Father    Cancer Neg Hx    COPD Neg Hx    Heart disease Neg Hx    Stroke Neg Hx    Family Psychiatric  History: denies Social History:  Social History   Substance and Sexual Activity  Alcohol Use Yes   Comment: a glass of wine and a couple beers a day     Social History   Substance and Sexual Activity  Drug Use No    Social History   Socioeconomic History   Marital status: Divorced    Spouse name: Not on file   Number of children: Not on file   Years of education: Not on file   Highest education level: Not on file  Occupational History   Not on file  Tobacco Use   Smoking status: Never   Smokeless tobacco: Never  Vaping Use   Vaping status: Never Used  Substance and Sexual Activity   Alcohol use: Yes    Comment: a glass of wine and a couple beers a day   Drug use: No   Sexual activity: Not Currently  Other Topics Concern   Not on file  Social History Narrative   Not on file   Social Determinants of Health   Financial Resource Strain: Not on file  Food Insecurity: No Food Insecurity (05/13/2022)   Hunger Vital Sign    Worried About Running Out of Food in the Last Year: Never true    Ran Out of Food in the Last Year: Never true  Recent Concern: Food Insecurity - Food Insecurity Present (04/17/2022)   Hunger Vital Sign    Worried About Running Out of Food in the Last Year: Often true    Ran Out of Food in the Last Year: Often true  Transportation Needs: No Transportation Needs (05/13/2022)   PRAPARE - Administrator, Civil Service (Medical): No    Lack of Transportation (Non-Medical): No  Physical Activity: Not on file  Stress: Not on file  Social Connections: Not on file   Additional Social History:    Allergies:   Allergies  Allergen Reactions   Talc     Nose bleeds     Labs: No results found for this or any previous visit (  from the past 48 hour(s)).  Medications:  Current Facility-Administered Medications  Medication Dose Route Frequency Provider Last Rate Last Admin   LORazepam (ATIVAN) tablet 1 mg  1 mg Oral TID PRN Chales Abrahams, NP       LORazepam (ATIVAN) tablet 2 mg  2 mg Oral Once Minna Antis, MD       Current Outpatient Medications  Medication Sig Dispense Refill   esomeprazole (NEXIUM) 40 MG capsule Take 1 capsule (40 mg total) by mouth 2 (two) times daily before a meal. 180 capsule 0   albuterol (PROVENTIL) (2.5 MG/3ML) 0.083% nebulizer solution Take 3 mLs (2.5 mg total) by nebulization every 6 (six) hours as needed for wheezing or shortness of breath. 150 mL 3   albuterol (VENTOLIN HFA) 108 (90 Base) MCG/ACT inhaler Inhale 2 puffs into the lungs every 4 (four) hours as needed for wheezing or shortness of breath. 18 g 3   allopurinol (ZYLOPRIM) 100 MG tablet Take 2 tablets (200 mg total) by mouth daily. 180 tablet 0   atorvastatin (LIPITOR) 20 MG tablet Take 1 tablet (20 mg total) by mouth daily. 90 tablet 3   bumetanide (BUMEX) 1 MG tablet Take by mouth. Take 2 tablets (2 mg total) by mouth in the morning and 2 tablets (2 mg total) at noon and 2 tablets (2 mg total) in the evening.     calcitRIOL (ROCALTROL) 0.5 MCG capsule Take 2 capsules (1 mcg total) by mouth 1 (one) time each day     carvedilol (COREG) 25 MG tablet Take 2 tablets (50 mg total) by mouth 2 (two) times daily with a meal. 360 tablet 1   cyanocobalamin (VITAMIN B12) 1000 MCG tablet Take 1 tablet (1,000 mcg total) by mouth daily. 90 tablet 1   cyclobenzaprine (FLEXERIL) 10 MG tablet Take 1 tablet (10 mg total) by mouth 3 (three) times daily as needed for muscle spasms. 60 tablet 2   dicyclomine (BENTYL) 10 MG capsule Take 1 capsule (10 mg total) by mouth 3 (three) times daily before meals. 90 capsule 0   ferrous gluconate (FERGON) 324 MG tablet Take 1 tablet (324 mg  total) by mouth daily with breakfast. 100 tablet 0   FLUoxetine (PROZAC) 10 MG capsule Take 1 capsule (10 mg total) by mouth daily. 30 capsule 0   fluticasone (FLONASE) 50 MCG/ACT nasal spray Place 2 sprays into both nostrils daily. Use for 4-6 weeks then stop and use seasonally or as needed. 16 g 3   gabapentin (NEURONTIN) 100 MG capsule      hydrocortisone 1 % ointment Apply 1 Application topically 2 (two) times daily. 30 g 0   lidocaine (LIDODERM) 5 % Place 1 patch onto the skin every 12 (twelve) hours. Remove & Discard patch within 12 hours or as directed by MD 10 patch 0   metolazone (ZAROXOLYN) 5 MG tablet Take 1 tablet (5 mg total) by mouth 1 (one) time each day if needed (for weight gain)     naloxegol oxalate (MOVANTIK) 12.5 MG TABS tablet Take 1 tablet (12.5 mg total) by mouth daily. 30 tablet 0   oxyCODONE (OXY IR/ROXICODONE) 5 MG immediate release tablet Take 1 tablet (5 mg total) by mouth every 6 (six) hours as needed for severe pain. 20 tablet 0   potassium chloride (KLOR-CON) 10 MEQ tablet Take 1 tablet (10 mEq total) by mouth daily. 30 tablet 2   simethicone (MYLICON) 125 MG chewable tablet Chew 125 mg by mouth every 6 (six) hours  as needed for flatulence.      Musculoskeletal: pt moves all extremities; ambulates with cane Strength & Muscle Tone: within normal limits Gait & Station: unsteady Patient leans: N/A     Psychiatric Specialty Exam:  Presentation  General Appearance: Casual; Fairly Groomed  Eye Contact:Fair  Speech:Slurred  Speech Volume:Normal  Handedness:Right   Mood and Affect  Mood:Depressed; Dysphoric; Irritable  Affect:Congruent; Blunt   Thought Process  Thought Processes:Goal Directed  Descriptions of Associations:Circumstantial  Orientation:Full (Time, Place and Person)  Thought Content:Illogical  History of Schizophrenia/Schizoaffective disorder:No data recorded Duration of Psychotic Symptoms:No data  recorded Hallucinations:Hallucinations: None  Ideas of Reference:None  Suicidal Thoughts:Suicidal Thoughts: Yes, Passive SI Passive Intent and/or Plan: Without Intent; Without Plan  Homicidal Thoughts:Homicidal Thoughts: Yes, Passive HI Passive Intent and/or Plan: Without Intent; Without Plan   Sensorium  Memory:Immediate Fair; Recent Fair; Remote Fair  Judgment:Impaired  Insight:Lacking   Executive Functions  Concentration:Poor  Attention Span:Poor  Recall:Fair  Fund of Knowledge:Fair  Language:Fair (pt speech is slurred)   Psychomotor Activity  Psychomotor Activity:Psychomotor Activity: Normal   Assets  Assets:Communication Skills; Housing; Financial Resources/Insurance   Sleep  Sleep:Sleep: Poor Number of Hours of Sleep: 1    Physical Exam: Physical Exam Vitals and nursing note reviewed.  Constitutional:      Appearance: He is obese.  Cardiovascular:     Rate and Rhythm: Normal rate.     Pulses: Normal pulses.  Pulmonary:     Effort: Pulmonary effort is normal.  Musculoskeletal:        General: Normal range of motion.     Cervical back: Normal range of motion.  Neurological:     Mental Status: He is alert and oriented to person, place, and time.  Psychiatric:        Attention and Perception: Attention normal.        Mood and Affect: Mood is anxious. Affect is blunt.        Speech: Speech is slurred.        Behavior: Behavior is cooperative.        Thought Content: Thought content includes homicidal and suicidal ideation. Thought content does not include homicidal or suicidal plan.        Cognition and Memory: Cognition and memory normal.        Judgment: Judgment is impulsive.    Review of Systems  Constitutional: Negative.   HENT: Negative.    Eyes: Negative.   Respiratory: Negative.    Cardiovascular: Negative.   Musculoskeletal:  Positive for back pain (chronic back pain).  Skin: Negative.   Neurological: Negative.    Endo/Heme/Allergies: Negative.   Psychiatric/Behavioral:  Positive for depression and suicidal ideas. Negative for hallucinations. The patient is nervous/anxious and has insomnia.    Blood pressure (!) 148/92, pulse 67, temperature 98 F (36.7 C), temperature source Oral, resp. rate 18, height 5\' 11"  (1.803 m), weight (!) 154.2 kg, SpO2 93%. Body mass index is 47.42 kg/m.  Treatment Plan Summary: Patient who presented to [facility] emergency room for evaluation of back pain and depressive symptoms.  On assessment he reports passive suicidal ideation with no plan or intent for self harm.  He also endorse denies passive HI and mounting frustration towards his 7 year old mother whom he resides with.  Of note, his homicidal thoughts towards his mother are quite alarming although he reports no desire to physically harm her.  Patient is triggered by environmental factors that have interfered with his sleep.  His presentation appears  to be exacerbated by alcohol use, which has lowered his inhibitions and made him more impulsive.   Patient denies family/friends whom he can stay with for a cool off period but does report he could go to a hotel if needed.     Given his slurred speech and dysphoric presentation, I do not believe he is appropriate for discharge home today.  Unsure if his presentation is related to blood sugar concerns or verbalized alcohol usage as blood work is pending.  However, his judgement and insight are compromised today and he lacks the capacity to make sound decisions.  As he appears impaired today, he is deemed a safety concern and would benefit from overnight observation where he can sober up from alcohol IF impaired; and sleep to improve is outlook.   He declines restarting antidepressants today.    He can be reassessed by psychiatry in the AM, if improved can be referred for outpatient resources.  Additionally, prior to discharge recommend safety planning and reaching out to his mother as  there is a duty to warn.    Medications: As he endorses alcohol use and displayed frustration/agitation when recommendations were communicated with him, will add ativan prn which can help with potential alcohol withdrawal symptoms as well and agitation.  -Ativan 1mg  po TID prn anxiety/potential alcohol withdrawal symptoms.  -CIWA can be ordered pending BAL results.  Disposition: Patient is recommended for overnight observation and AM psychiatry reassessment.   This service was provided via telemedicine using a 2-way, interactive audio and video technology.  Spoke with Dr. Lenard Lance, ED Provider;Judith Joan Flores, RN; Robinette Haines, TTS counselor; were all informed of above recommendation and disposition via secure chat and or telephone call.   Names of all persons participating in this telemedicine service and their role in this encounter. Name: Theodoro Kos Role: Patient  Name: Ophelia Shoulder Role: PMHNP  Name: Gretta Cool Role: Psychiatrist    Chales Abrahams, NP 08/07/2022 12:46 PM

## 2022-08-07 NOTE — ED Notes (Signed)
Pt appears to be minimizing statements made to NP. Pt states "yall are tricking me". Pt places blames of multiple situations on other people multiple times during this RN conversation with patient.

## 2022-08-07 NOTE — ED Notes (Signed)
TTS at bedside. 

## 2022-08-07 NOTE — ED Notes (Signed)
Vol /psych consult pending 

## 2022-08-07 NOTE — ED Notes (Signed)
Pt speaking with TTS 

## 2022-08-07 NOTE — ED Notes (Signed)
Pt given crackers.  

## 2022-08-07 NOTE — ED Notes (Signed)
Per patient does not want medical information shared with anyone at this time.

## 2022-08-07 NOTE — BH Assessment (Signed)
Comprehensive Clinical Assessment (CCA) Screening, Triage and Referral Note  08/07/2022 [name] [id number]  Chief Complaint:  Chief Complaint  Patient presents with   Back Pain   Depression   Visit Diagnosis: Mood Disorder  [name] is 57 year old male who presents to the ER due to getting upset with his mother, and his house was hot. He told TTS he wanted his "blood pressure was off." Patient states he does not want to harm his mother, she "just get on my nerves." Throughout the interview, the patient denied SI/HI and AV/H.  Patient Reported Information How did you hear about [place]? Self  What Is the Reason for Your Visit/Call Today? Came to the ER because he was mad with his mother.  How Long Has This Been Causing You Problems? <Week  What Do You Feel Would Help You the Most Today? Treatment for Depression or other mood problem   Have You Recently Had Any Thoughts About Hurting Yourself? No  Are You Planning to Commit Suicide/Harm Yourself At This time? No   Have you Recently Had Thoughts About Hurting Someone [name]? No  Are You Planning to Harm Someone at This Time? No  Explanation: No data recorded  Have You Used Any Alcohol or Drugs in the Past 24 Hours? Yes  How Long Ago Did You Use Drugs or Alcohol? No data recorded What Did You Use and How Much? Alcohol   Do You Currently Have a Therapist/Psychiatrist? No  Name of Therapist/Psychiatrist: No data recorded  Have You Been Recently Discharged From Any Office Practice or Programs? No  Explanation of Discharge From Practice/Program: No data recorded   CCA Screening Triage Referral Assessment Type of Contact: Face-to-Face  Telemedicine Service Delivery:   Is this Initial or Reassessment?   Date Telepsych consult ordered in [facility]:    Time Telepsych consult ordered in [facility]:    Location of Assessment: [facility] ED  Provider Location: [facility] ED    Collateral Involvement: No data recorded  Does Patient  Have a Court Appointed Legal Guardian? No data recorded Name and Contact of Legal Guardian: No data recorded If Minor and Not Living with Parent(s), Who has Custody? No data recorded Is CPS involved or ever been involved? Never  Is APS involved or ever been involved? Never   Patient Determined To Be At Risk for Harm To Self or Others Based on Review of Patient Reported Information or Presenting Complaint? No  Method: No data recorded Availability of Means: No data recorded Intent: No data recorded Notification Required: No data recorded Additional Information for Danger to Others Potential: No data recorded Additional Comments for Danger to Others Potential: No data recorded Are There Guns or Other Weapons in Your Home? No  Types of Guns/Weapons: No data recorded Are These Weapons Safely Secured?                            No data recorded Who Could Verify You Are Able To Have These Secured: No data recorded Do You Have any Outstanding Charges, Pending Court Dates, Parole/Probation? No data recorded Contacted To Inform of Risk of Harm To Self or Others: No data recorded  Does Patient Present under Involuntary Commitment? No  [place] of Residence: [place]   Patient Currently Receiving the Following Services: Not Receiving Services   Determination of Need: Emergent (2 hours)   Options For Referral: ED Visit   Discharge Disposition:     Jerilynn Som  Meredith Pel MS, LCAS, [facility], NCC Therapeutic Triage Specialist 08/07/2022 9:48 AM

## 2022-08-07 NOTE — ED Notes (Signed)
INVOLUNTARY with all papers on chart recommended for overnight observation and AM psychiatry reassessment.

## 2022-08-08 DIAGNOSIS — F430 Acute stress reaction: Secondary | ICD-10-CM

## 2022-08-08 NOTE — ED Notes (Signed)
Psych team at bedside .

## 2022-08-08 NOTE — ED Notes (Signed)
ivc/pending re-observation and psychiatry reassessment.

## 2022-08-08 NOTE — ED Provider Notes (Signed)
Emergency Medicine Observation Re-evaluation Note  [name] is a 57 y.o. male, seen on rounds today.  Pt initially presented to the ED for complaints of Back Pain and Depression Currently, the patient is awaiting psych dispo.  Physical Exam  BP (!) 159/95 (BP Location: Right Arm)   Pulse 64   Temp 98.5 F (36.9 C) (Oral)   Resp 18   Ht 5\' 11"  (1.803 m)   Wt (!) 154.2 kg   SpO2 92%   BMI 47.42 kg/m  Physical Exam General: resting comfortably   ED Course / MDM   I have reviewed the labs performed to date as well as medications administered while in observation.    Plan  Current plan is for psychiatric disposition.    Phineas Semen, MD 08/08/22 [phone]

## 2022-08-08 NOTE — ED Notes (Signed)
E-signature not working at this time. Pt verbalized understanding of D/C instructions, prescriptions and follow up care with no further questions at this time. Pt in NAD and wheeled to lobby per request. Belongings bag 1/1 returned to patient at time of discharge

## 2022-08-08 NOTE — Consult Note (Signed)
[facility] Face-to-Face Psychiatry Consult   Reason for Consult:  comments about wishing his mother were dead Referring Physician:  EDP Patient Identification: COLON RUETH MRN:  [id number] Principal Diagnosis: Stress reaction causing mixed disturbance of emotion and conduct Diagnosis:  Principal Problem:   Stress reaction causing mixed disturbance of emotion and conduct Active Problems:   Alcohol abuse with intoxication (HCC)   Total Time spent with patient: 30 minutes  Subjective:   [name] is a 57 y.o. male patient admitted with back pain and upset with his mother.  HPI:   57 yo male presented to the ED initially with back pain and then voiced he wish his mother would die because she would not turn down the air condition.  He had no plan or intent to hurt his mother, no past charges or aggressive behaviors, none noted in his chart either.  He has been calm and cooperative in the ED.  On assessment, he stated, "I'm sorry about what I said, I didn't mean it.  I would never hurt my mother or anyone else."  He is working on a solution for his room to be cooler.  No past psychiatric issues or admission.  He denies suicidal/homicidal ideations, hallucinations, and substance abuse other than some alcohol at times.  No guns in the home or access to them.  Psych cleared and information provided for follow up therapy.  He has a desire to attend group therapy as he is dealing with kidney failure and other stressors.  Past Psychiatric History: none  Risk to Self: none  Risk to Others:  none Prior Inpatient Therapy:  none Prior Outpatient Therapy:  none  Past Medical History:  Past Medical History:  Diagnosis Date   Allergy    Asthma    Diabetes mellitus without complication (HCC)    Gout    Hyperlipidemia    Hypertension    Morbid obesity (HCC)     Past Surgical History:  Procedure Laterality Date   DIALYSIS/PERMA CATHETER INSERTION N/A 04/29/2022   Procedure: DIALYSIS/PERMA  CATHETER INSERTION;  Surgeon: Annice Needy, MD;  Location: [facility] INVASIVE CV LAB;  Service: Cardiovascular;  Laterality: N/A;   DIALYSIS/PERMA CATHETER REMOVAL N/A 07/06/2022   Procedure: DIALYSIS/PERMA CATHETER REMOVAL;  Surgeon: Annice Needy, MD;  Location: [facility] INVASIVE CV LAB;  Service: Cardiovascular;  Laterality: N/A;   TEMPORARY DIALYSIS CATHETER Right 11/14/2021   Procedure: TEMPORARY DIALYSIS CATHETER;  Surgeon: Annice Needy, MD;  Location: [facility] INVASIVE CV LAB;  Service: Cardiovascular;  Laterality: Right;   TEMPORARY DIALYSIS CATHETER N/A 04/24/2022   Procedure: TEMPORARY DIALYSIS CATHETER;  Surgeon: Renford Dills, MD;  Location: [facility] INVASIVE CV LAB;  Service: Cardiovascular;  Laterality: N/A;   Family History:  Family History  Problem Relation Age of Onset   Diabetes Mother    Hypertension Mother    Alcohol abuse Father    Glaucoma Father    Diabetes Father    Hypertension Father    Cancer Neg Hx    COPD Neg Hx    Heart disease Neg Hx    Stroke Neg Hx    Family Psychiatric  History: see above Social History:  Social History   Substance and Sexual Activity  Alcohol Use Yes   Comment: a glass of wine and a couple beers a day     Social History   Substance and Sexual Activity  Drug Use No    Social History   Socioeconomic History   Marital status:  Divorced    Spouse name: Not on file   Number of children: Not on file   Years of education: Not on file   Highest education level: Not on file  Occupational History   Not on file  Tobacco Use   Smoking status: Never   Smokeless tobacco: Never  Vaping Use   Vaping status: Never Used  Substance and Sexual Activity   Alcohol use: Yes    Comment: a glass of wine and a couple beers a day   Drug use: No   Sexual activity: Not Currently  Other Topics Concern   Not on file  Social History Narrative   Not on file   Social Determinants of Health   Financial Resource Strain: Not on file  Food Insecurity: No  Food Insecurity (05/13/2022)   Hunger Vital Sign    Worried About Running Out of Food in the Last Year: Never true    Ran Out of Food in the Last Year: Never true  Recent Concern: Food Insecurity - Food Insecurity Present (04/17/2022)   Hunger Vital Sign    Worried About Running Out of Food in the Last Year: Often true    Ran Out of Food in the Last Year: Often true  Transportation Needs: No Transportation Needs (05/13/2022)   PRAPARE - Administrator, Civil Service (Medical): No    Lack of Transportation (Non-Medical): No  Physical Activity: Not on file  Stress: Not on file  Social Connections: Not on file   Additional Social History:    Allergies:   Allergies  Allergen Reactions   Talc     Nose bleeds    Labs:  Results for orders placed or performed during the hospital encounter of 08/07/22 (from the past 48 hour(s))  Urine Drug Screen, Qualitative ([facility] only)     Status: None   Collection Time: 08/07/22 12:08 PM  Result Value Ref Range   Tricyclic, Ur Screen NONE DETECTED NONE DETECTED   Amphetamines, Ur Screen NONE DETECTED NONE DETECTED   MDMA (Ecstasy)Ur Screen NONE DETECTED NONE DETECTED   Cocaine Metabolite,Ur [place] NONE DETECTED NONE DETECTED   Opiate, Ur Screen NONE DETECTED NONE DETECTED   Phencyclidine (PCP) Ur S NONE DETECTED NONE DETECTED   Cannabinoid 50 Ng, Ur  NONE DETECTED NONE DETECTED   Barbiturates, Ur Screen NONE DETECTED NONE DETECTED   Benzodiazepine, Ur Scrn NONE DETECTED NONE DETECTED   Methadone Scn, Ur NONE DETECTED NONE DETECTED    Comment: (NOTE) Tricyclics + metabolites, urine    Cutoff 1000 ng/mL Amphetamines + metabolites, urine  Cutoff 1000 ng/mL MDMA (Ecstasy), urine              Cutoff 500 ng/mL Cocaine Metabolite, urine          Cutoff 300 ng/mL Opiate + metabolites, urine        Cutoff 300 ng/mL Phencyclidine (PCP), urine         Cutoff 25 ng/mL Cannabinoid, urine                 Cutoff 50 ng/mL Barbiturates + metabolites,  urine  Cutoff 200 ng/mL Benzodiazepine, urine              Cutoff 200 ng/mL Methadone, urine                   Cutoff 300 ng/mL  The urine drug screen provides only a preliminary, unconfirmed analytical test result and should not be used for non-medical purposes.  Clinical consideration and professional judgment should be applied to any positive drug screen result due to possible interfering substances. A more specific alternate chemical method must be used in order to obtain a confirmed analytical result. Gas chromatography / mass spectrometry (GC/MS) is the preferred confirm atory method. Performed at [facility], [address] [place], [place] [address]     Current Facility-Administered Medications  Medication Dose Route Frequency Provider Last Rate Last Admin   allopurinol (ZYLOPRIM) tablet 200 mg  200 mg Oral Daily Sharman Cheek, MD   200 mg at 08/08/22 0920   atorvastatin (LIPITOR) tablet 20 mg  20 mg Oral Daily Sharman Cheek, MD       bumetanide Janalyn Harder) tablet 2 mg  2 mg Oral TID Sharman Cheek, MD   2 mg at 08/08/22 0920   carvedilol (COREG) tablet 50 mg  50 mg Oral BID WC Sharman Cheek, MD   50 mg at 08/08/22 0920   dicyclomine (BENTYL) capsule 10 mg  10 mg Oral TID Danton Sewer, MD       FLUoxetine (PROZAC) capsule 10 mg  10 mg Oral Daily Sharman Cheek, MD       LORazepam (ATIVAN) tablet 1 mg  1 mg Oral TID PRN Chales Abrahams, NP       LORazepam (ATIVAN) tablet 2 mg  2 mg Oral Once Minna Antis, MD       pantoprazole (PROTONIX) EC tablet 80 mg  80 mg Oral Q1200 Sharman Cheek, MD       Current Outpatient Medications  Medication Sig Dispense Refill   albuterol (PROVENTIL) (2.5 MG/3ML) 0.083% nebulizer solution Take 3 mLs (2.5 mg total) by nebulization every 6 (six) hours as needed for wheezing or shortness of breath. 150 mL 3   albuterol (VENTOLIN HFA) 108 (90 Base) MCG/ACT inhaler Inhale 2 puffs into the lungs every 4 (four)  hours as needed for wheezing or shortness of breath. 18 g 3   allopurinol (ZYLOPRIM) 100 MG tablet Take 2 tablets (200 mg total) by mouth daily. 180 tablet 0   atorvastatin (LIPITOR) 20 MG tablet Take 1 tablet (20 mg total) by mouth daily. 90 tablet 3   bumetanide (BUMEX) 1 MG tablet Take by mouth. Take 2 tablets (2 mg total) by mouth in the morning and 2 tablets (2 mg total) at noon and 2 tablets (2 mg total) in the evening.     carvedilol (COREG) 25 MG tablet Take 2 tablets (50 mg total) by mouth 2 (two) times daily with a meal. 360 tablet 1   cyclobenzaprine (FLEXERIL) 10 MG tablet Take 1 tablet (10 mg total) by mouth 3 (three) times daily as needed for muscle spasms. 60 tablet 2   dicyclomine (BENTYL) 10 MG capsule Take 1 capsule (10 mg total) by mouth 3 (three) times daily before meals. 90 capsule 0   esomeprazole (NEXIUM) 40 MG capsule Take 1 capsule (40 mg total) by mouth 2 (two) times daily before a meal. 180 capsule 0   FLUoxetine (PROZAC) 10 MG capsule Take 1 capsule (10 mg total) by mouth daily. 30 capsule 0   simethicone (MYLICON) 125 MG chewable tablet Chew 125 mg by mouth every 6 (six) hours as needed for flatulence.     calcitRIOL (ROCALTROL) 0.5 MCG capsule Take 2 capsules (1 mcg total) by mouth 1 (one) time each day (Patient not taking: Reported on 08/07/2022)     cyanocobalamin (VITAMIN B12) 1000 MCG tablet Take 1 tablet (1,000 mcg total) by  mouth daily. (Patient not taking: Reported on 08/07/2022) 90 tablet 1   ferrous gluconate (FERGON) 324 MG tablet Take 1 tablet (324 mg total) by mouth daily with breakfast. (Patient not taking: Reported on 08/07/2022) 100 tablet 0   fluticasone (FLONASE) 50 MCG/ACT nasal spray Place 2 sprays into both nostrils daily. Use for 4-6 weeks then stop and use seasonally or as needed. (Patient not taking: Reported on 08/07/2022) 16 g 3   gabapentin (NEURONTIN) 100 MG capsule  (Patient not taking: Reported on 08/07/2022)     hydrocortisone 1 % ointment Apply 1  Application topically 2 (two) times daily. (Patient not taking: Reported on 08/07/2022) 30 g 0   lidocaine (LIDODERM) 5 % Place 1 patch onto the skin every 12 (twelve) hours. Remove & Discard patch within 12 hours or as directed by MD (Patient not taking: Reported on 08/07/2022) 10 patch 0   metolazone (ZAROXOLYN) 5 MG tablet Take 1 tablet (5 mg total) by mouth 1 (one) time each day if needed (for weight gain)     naloxegol oxalate (MOVANTIK) 12.5 MG TABS tablet Take 1 tablet (12.5 mg total) by mouth daily. (Patient not taking: Reported on 08/07/2022) 30 tablet 0   oxyCODONE (OXY IR/ROXICODONE) 5 MG immediate release tablet Take 1 tablet (5 mg total) by mouth every 6 (six) hours as needed for severe pain. (Patient not taking: Reported on 08/07/2022) 20 tablet 0   potassium chloride (KLOR-CON) 10 MEQ tablet Take 1 tablet (10 mEq total) by mouth daily. (Patient not taking: Reported on 08/07/2022) 30 tablet 2    Musculoskeletal: Strength & Muscle Tone: within normal limits Gait & Station: normal Patient leans: N/A  Psychiatric Specialty Exam: Physical Exam Vitals and nursing note reviewed.  Constitutional:      Appearance: Normal appearance.  HENT:     Head: Normocephalic.     Nose: Nose normal.  Pulmonary:     Effort: Pulmonary effort is normal.  Musculoskeletal:        General: Normal range of motion.     Cervical back: Normal range of motion.  Neurological:     General: No focal deficit present.     Mental Status: He is alert and oriented to person, place, and time.  Psychiatric:        Attention and Perception: Attention and perception normal.        Mood and Affect: Mood is anxious.        Speech: Speech normal.        Behavior: Behavior normal. Behavior is cooperative.        Thought Content: Thought content normal.        Cognition and Memory: Cognition and memory normal.        Judgment: Judgment normal.     Review of Systems  Psychiatric/Behavioral:  The patient is  nervous/anxious.   All other systems reviewed and are negative.   Blood pressure (!) 145/82, pulse 71, temperature 98.2 F (36.8 C), temperature source Oral, resp. rate 18, height 5\' 11"  (1.803 m), weight (!) 154.2 kg, SpO2 90%.Body mass index is 47.42 kg/m.  General Appearance: Casual  Eye Contact:  Good  Speech:  Normal Rate  Volume:  Normal  Mood:  Anxious  Affect:  Congruent  Thought Process:  Coherent  Orientation:  Full (Time, Place, and Person)  Thought Content:  Logical  Suicidal Thoughts:  No  Homicidal Thoughts:  No  Memory:  Immediate;   Good Recent;   Good Remote;   Good  Judgement:  Fair  Insight:  Good  Psychomotor Activity:  Normal  Concentration:  Concentration: Good and Attention Span: Good  Recall:  Good  Fund of Knowledge:  Good  Language:  Good  Akathisia:  No  Handed:  Right  AIMS (if indicated):     Assets:  Housing Leisure Time Physical Health Resilience Social Support  ADL's:  Intact  Cognition:  WNL  Sleep:         Physical Exam: Physical Exam Vitals and nursing note reviewed.  Constitutional:      Appearance: Normal appearance.  HENT:     Head: Normocephalic.     Nose: Nose normal.  Pulmonary:     Effort: Pulmonary effort is normal.  Musculoskeletal:        General: Normal range of motion.     Cervical back: Normal range of motion.  Neurological:     General: No focal deficit present.     Mental Status: He is alert and oriented to person, place, and time.  Psychiatric:        Attention and Perception: Attention and perception normal.        Mood and Affect: Mood is anxious.        Speech: Speech normal.        Behavior: Behavior normal. Behavior is cooperative.        Thought Content: Thought content normal.        Cognition and Memory: Cognition and memory normal.        Judgment: Judgment normal.    Review of Systems  Psychiatric/Behavioral:  The patient is nervous/anxious.   All other systems reviewed and are  negative.  Blood pressure (!) 145/82, pulse 71, temperature 98.2 F (36.8 C), temperature source Oral, resp. rate 18, height 5\' 11"  (1.803 m), weight (!) 154.2 kg, SpO2 90%. Body mass index is 47.42 kg/m.  Treatment Plan Summary: Stress reaction with mixed disturbance of emotion and conduct: Follow up with group therapy   Disposition: Patient does not meet criteria for psychiatric inpatient admission.  Nanine Means, NP 08/08/2022 1:05 PM

## 2022-08-08 NOTE — BH Assessment (Signed)
Writer spoke with the patient to complete an updated/reassessment. Patient denies SI/HI and AV/H. 

## 2022-08-08 NOTE — ED Notes (Signed)
Breakfast Tray given  

## 2022-08-14 ENCOUNTER — Telehealth: Payer: Self-pay

## 2022-08-14 NOTE — Telephone Encounter (Signed)
Transition Care Management Follow-up Telephone Call Date of discharge and from where: 08/08/2022 [facility] How have you been since you were released from the hospital? Patient is not feeling better, still having back and hip pain. Any questions or concerns? No  Items Reviewed: Did the pt receive and understand the discharge instructions provided? Yes  Medications obtained and verified? Yes  Other? No  Any new allergies since your discharge? No  Dietary orders reviewed? Yes Do you have support at home? Yes   Follow up appointments reviewed:  PCP Hospital f/u appt confirmed? No  Scheduled to see  on  @ . Specialist Hospital f/u appt confirmed? No  Scheduled to see  on  @ . Are transportation arrangements needed? No  If their condition worsens, is the pt aware to call PCP or go to the Emergency Dept.? Yes Was the patient provided with contact information for the PCP's office or ED? Yes Was to pt encouraged to call back with questions or concerns? Yes  Candence Sease [facility]  [facility] Population Health Community Resource Care Guide   [email or web address]  ?? [phone]   Website: [email or web address]  [email or web address]

## 2022-08-19 NOTE — Progress Notes (Signed)
  Care Coordination Note  08/19/2022 Name: [name] MRN: [id number] DOB: 1965-10-31  [name] is a 57 y.o. year old male who is a primary care patient of Carren Rang, Cordelia Poche and is actively engaged with the care management team. I reached out to Andreas Newport by phone today to assist with re-scheduling a follow up visit with the RN Case Manager  Follow up plan: We have been unable to make contact with patient for follow up   Burman Nieves, [facility] Care Coordination Care Guide Direct Dial: [phone]

## 2022-08-19 NOTE — Progress Notes (Signed)
  Care Coordination Note  08/19/2022 Name: [name] MRN: [id number] DOB: 1965-06-15  [name] is a 57 y.o. year old male who is a primary care patient of Carren Rang, Cordelia Poche and is actively engaged with the care management team. I reached out to Andreas Newport by phone today to assist with re-scheduling a follow up visit with the RN Case Manager  Follow up plan: Telephone appointment with care management team member scheduled for:09/03/2022  Burman Nieves, [facility] Care Coordination Care Guide Direct Dial: [phone]

## 2022-08-28 ENCOUNTER — Inpatient Hospital Stay: Payer: PPO

## 2022-09-01 ENCOUNTER — Inpatient Hospital Stay: Payer: PPO | Admitting: Oncology

## 2022-09-01 ENCOUNTER — Inpatient Hospital Stay: Payer: PPO | Attending: Oncology

## 2022-09-01 ENCOUNTER — Inpatient Hospital Stay: Payer: PPO

## 2022-09-01 DIAGNOSIS — N184 Chronic kidney disease, stage 4 (severe): Secondary | ICD-10-CM | POA: Insufficient documentation

## 2022-09-01 DIAGNOSIS — D631 Anemia in chronic kidney disease: Secondary | ICD-10-CM | POA: Diagnosis not present

## 2022-09-01 DIAGNOSIS — E611 Iron deficiency: Secondary | ICD-10-CM | POA: Insufficient documentation

## 2022-09-01 DIAGNOSIS — Z992 Dependence on renal dialysis: Secondary | ICD-10-CM | POA: Insufficient documentation

## 2022-09-01 DIAGNOSIS — Z79899 Other long term (current) drug therapy: Secondary | ICD-10-CM | POA: Insufficient documentation

## 2022-09-01 LAB — CBC WITH DIFFERENTIAL/PLATELET
Abs Immature Granulocytes: 0.01 10*3/uL (ref 0.00–0.07)
Basophils Absolute: 0 10*3/uL (ref 0.0–0.1)
Basophils Relative: 1 %
Eosinophils Absolute: 0 10*3/uL (ref 0.0–0.5)
Eosinophils Relative: 2 %
HCT: 43.2 % (ref 39.0–52.0)
Hemoglobin: 13.6 g/dL (ref 13.0–17.0)
Immature Granulocytes: 0 %
Lymphocytes Relative: 34 %
Lymphs Abs: 0.9 10*3/uL (ref 0.7–4.0)
MCH: 30.3 pg (ref 26.0–34.0)
MCHC: 31.5 g/dL (ref 30.0–36.0)
MCV: 96.2 fL (ref 80.0–100.0)
Monocytes Absolute: 0.3 10*3/uL (ref 0.1–1.0)
Monocytes Relative: 10 %
Neutro Abs: 1.4 10*3/uL — ABNORMAL LOW (ref 1.7–7.7)
Neutrophils Relative %: 53 %
Platelets: 75 10*3/uL — ABNORMAL LOW (ref 150–400)
RBC: 4.49 MIL/uL (ref 4.22–5.81)
RDW: 14.1 % (ref 11.5–15.5)
WBC: 2.6 10*3/uL — ABNORMAL LOW (ref 4.0–10.5)
nRBC: 0 % (ref 0.0–0.2)

## 2022-09-01 LAB — RETIC PANEL
Immature Retic Fract: 15.1 % (ref 2.3–15.9)
RBC.: 4.5 MIL/uL (ref 4.22–5.81)
Retic Count, Absolute: 61 10*3/uL (ref 19.0–186.0)
Retic Ct Pct: 1.4 % (ref 0.4–3.1)
Reticulocyte Hemoglobin: 20.9 pg — ABNORMAL LOW (ref >27.9)

## 2022-09-01 LAB — IRON AND TIBC
Iron: 27 ug/dL — ABNORMAL LOW (ref 45–182)
Saturation Ratios: 7 % — ABNORMAL LOW (ref 17.9–39.5)
TIBC: 374 ug/dL (ref 250–450)
UIBC: 347 ug/dL

## 2022-09-01 LAB — FERRITIN: Ferritin: 17 ng/mL — ABNORMAL LOW (ref 24–336)

## 2022-09-03 ENCOUNTER — Ambulatory Visit: Payer: Self-pay | Admitting: *Deleted

## 2022-09-03 MED FILL — Iron Sucrose Inj 20 MG/ML (Fe Equiv): INTRAVENOUS | Qty: 10 | Status: AC

## 2022-09-03 NOTE — Patient Outreach (Signed)
Care Coordination   Follow Up Visit Note   09/03/2022 Name: [name]: [id number] DOB: 1965-12-22  [name] is a 57 y.o. year old male who sees Carren Rang, [place] for primary care. I spoke with  [name] by phone today.  What matters to the patients health and wellness today?  Finding a home to move out of mother's home. Does not wish today to speak about health, state his medical conditions are "fine."  If he feels he has medical need, he will call this RNCM.  Reminded again of resources provided by CSW.     Goals Addressed             This Visit's Progress    COMPLETED: Effective management of ESRD   On track    Care Coordination Interventions: Assessed the Patient understanding of chronic kidney disease    Evaluation of current treatment plan related to chronic kidney disease self management and patient's adherence to plan as established by provider      Reviewed medications with patient and discussed importance of compliance    Advised patient, providing education and rationale, to monitor blood pressure daily and record, calling PCP for findings outside established parameters    Discussed complications of poorly controlled blood pressure such as heart disease, stroke, circulatory complications, vision complications, kidney impairment, sexual dysfunction    Discussed plans with patient for ongoing care management follow up and provided patient with direct contact information for care management team    Discussed the impact of chronic kidney disease on daily life and mental health and acknowledged and normalized feelings of disempowerment, fear, and frustration     Discussed current situation of being homeless, will collaborate with CSW for resources Advised importance of daily weights, blood pressure & blood sugar checks, State these are monitored at HD sessions.      COMPLETED: Effective management of neuropathy   On track    Care Coordination  Interventions: Evaluation of current treatment plan related to neuropathy and patient's adherence to plan as established by provider Advised patient to keep legs elevated, increase movement and activity as tolerated in effort to increase circulation Reviewed medications with patient and discussed adherence and affordability.  Active with pharmacy team Advised patient, providing education and rationale, to check cbg daily (last A1C as 6.2) and record, calling PCP for findings outside established parameters  Discussed plans with patient for ongoing care management follow up and provided patient with direct contact information for care management team         SDOH assessments and interventions completed:  No     Care Coordination Interventions:  Yes, provided   Interventions Today    Flowsheet Row Most Recent Value  Chronic Disease   Chronic disease during today's visit Hypertension (HTN), Chronic Kidney Disease/End Stage Renal Disease (ESRD)  General Interventions   General Interventions Discussed/Reviewed General Interventions Reviewed, Doctor Visits  Doctor Visits Discussed/Reviewed Doctor Visits Reviewed, PCP  [Report attended appt with new PCP]  PCP/Specialist Visits Compliance with follow-up visit  Education Interventions   Education Provided Provided Education  Provided Verbal Education On Medication, When to see the doctor, Other, [facility]  [Report his health conditions are not a concern for him at this time, state he is more focused on getting a place of his own. Reminded to follow up on resources provided by CSW in the past several months.]       Follow up plan: No further intervention required.  Encounter Outcome:  Pt. Visit Completed   Kemper Durie, RN, MSN, [facility] Care Management Care Management Coordinator [phone]

## 2022-09-03 NOTE — Patient Outreach (Signed)
  Care Coordination   09/03/2022 Name: [name] MRN: [id number] DOB: 08/07/1965   Care Coordination Outreach Attempts:  An unsuccessful telephone outreach was attempted for a scheduled appointment today.  Follow Up Plan:  Additional outreach attempts will be made to offer the patient care coordination information and services.   Encounter Outcome:  No Answer   Care Coordination Interventions:  No, not indicated    Kemper Durie, RN, MSN, [facility] Care Management Care Management Coordinator [phone]

## 2022-09-04 ENCOUNTER — Inpatient Hospital Stay: Payer: PPO

## 2022-09-04 ENCOUNTER — Inpatient Hospital Stay (HOSPITAL_BASED_OUTPATIENT_CLINIC_OR_DEPARTMENT_OTHER): Payer: PPO | Admitting: Oncology

## 2022-09-04 ENCOUNTER — Encounter: Payer: Self-pay | Admitting: Oncology

## 2022-09-04 VITALS — BP 149/94 | HR 79 | Temp 97.1°F | Resp 18

## 2022-09-04 DIAGNOSIS — N184 Chronic kidney disease, stage 4 (severe): Secondary | ICD-10-CM | POA: Diagnosis not present

## 2022-09-04 DIAGNOSIS — D631 Anemia in chronic kidney disease: Secondary | ICD-10-CM | POA: Diagnosis not present

## 2022-09-04 MED ORDER — FERROUS GLUCONATE 324 (38 FE) MG PO TABS: 324.0000 mg | ORAL_TABLET | Freq: Every day | ORAL | 1 refills | Status: DC
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
## 2022-09-04 NOTE — Assessment & Plan Note (Addendum)
Anemia is likely due to chronic kidney disease.   Labs are reviewed and discussed with patient. Lab Results  Component Value Date   HGB 13.6 09/01/2022   TIBC 374 09/01/2022   IRONPCTSAT 7 (L) 09/01/2022   FERRITIN 17 (L) 09/01/2022    Hemoglobin is normal.  Recommend patient to take Fergon 324mg  daily.

## 2022-09-09 ENCOUNTER — Emergency Department
Admission: EM | Admit: 2022-09-09 | Discharge: 2022-09-09 | Disposition: A | Discharge status: Home / Self Care | Payer: PPO | Attending: Emergency Medicine | Admitting: Emergency Medicine

## 2022-09-09 ENCOUNTER — Other Ambulatory Visit: Payer: Self-pay

## 2022-09-09 ENCOUNTER — Encounter: Payer: Self-pay | Admitting: Emergency Medicine

## 2022-09-09 ENCOUNTER — Emergency Department: Payer: PPO

## 2022-09-09 DIAGNOSIS — J441 Chronic obstructive pulmonary disease with (acute) exacerbation: Secondary | ICD-10-CM | POA: Diagnosis not present

## 2022-09-09 DIAGNOSIS — Z5329 Procedure and treatment not carried out because of patient's decision for other reasons: Secondary | ICD-10-CM | POA: Insufficient documentation

## 2022-09-09 DIAGNOSIS — N184 Chronic kidney disease, stage 4 (severe): Secondary | ICD-10-CM | POA: Diagnosis not present

## 2022-09-09 DIAGNOSIS — I10 Essential (primary) hypertension: Secondary | ICD-10-CM | POA: Diagnosis not present

## 2022-09-09 DIAGNOSIS — R0902 Hypoxemia: Secondary | ICD-10-CM

## 2022-09-09 DIAGNOSIS — E119 Type 2 diabetes mellitus without complications: Secondary | ICD-10-CM | POA: Insufficient documentation

## 2022-09-09 DIAGNOSIS — R0602 Shortness of breath: Secondary | ICD-10-CM | POA: Diagnosis present

## 2022-09-09 MED ORDER — ALBUTEROL SULFATE (2.5 MG/3ML) 0.083% IN NEBU: 2.5000 mg | INHALATION_SOLUTION | Freq: Once | RESPIRATORY_TRACT | Status: DC

## 2022-09-09 MED ORDER — ALBUTEROL SULFATE HFA 108 (90 BASE) MCG/ACT IN AERS
2.0000 | INHALATION_SPRAY | RESPIRATORY_TRACT | Status: AC
  Administered 2022-09-09: 2 via RESPIRATORY_TRACT
  Filled 2022-09-09: qty 6.7

## 2022-09-09 MED ORDER — COMPRESSOR/NEBULIZER MISC: 1.0000 | Freq: Once | 0 refills | Status: AC

## 2022-09-09 MED ORDER — ALBUTEROL SULFATE HFA 108 (90 BASE) MCG/ACT IN AERS: 2.0000 | INHALATION_SPRAY | RESPIRATORY_TRACT | 2 refills | Status: DC | PRN

## 2022-09-09 MED ORDER — IPRATROPIUM-ALBUTEROL 0.5-2.5 (3) MG/3ML IN SOLN
RESPIRATORY_TRACT | Status: AC
  Administered 2022-09-09: 3 mL via RESPIRATORY_TRACT
  Filled 2022-09-09: qty 6

## 2022-09-09 MED ORDER — PREDNISONE 20 MG PO TABS: 40.0000 mg | ORAL_TABLET | Freq: Every day | ORAL | 0 refills | Status: AC

## 2022-09-09 MED ORDER — ALBUTEROL SULFATE (2.5 MG/3ML) 0.083% IN NEBU: 2.5000 mg | INHALATION_SOLUTION | RESPIRATORY_TRACT | 2 refills | Status: DC | PRN

## 2022-09-09 MED ORDER — IPRATROPIUM-ALBUTEROL 0.5-2.5 (3) MG/3ML IN SOLN: 3.0000 mL | Freq: Once | RESPIRATORY_TRACT | Status: AC

## 2022-09-09 NOTE — ED Notes (Signed)
Pt signed AMA form even with o2 sats falling.
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
## 2022-09-09 NOTE — ED Triage Notes (Signed)
Pt via POV from home. Pt here for SOB for the past week and productive cough. Denies fever. Denies CP. States he does not have a hx of CHF/COPD, only allergies. On arrival, 76% on RA, placed on 6L [place] and 98%. Pt states he is just here for a neb treatment. Pt is A&Ox4 and NAD

## 2022-09-09 NOTE — Discharge Instructions (Addendum)
As we discussed, it is critical to return to the ER immediately if you decide you are amenable to admission  Your oxygen levels were low today and I'd recommend admission in this case

## 2022-09-10 DIAGNOSIS — J441 Chronic obstructive pulmonary disease with (acute) exacerbation: Secondary | ICD-10-CM | POA: Diagnosis not present

## 2022-09-10 DIAGNOSIS — R0902 Hypoxemia: Secondary | ICD-10-CM | POA: Diagnosis not present

## 2022-09-12 ENCOUNTER — Emergency Department: Payer: PPO

## 2022-09-12 ENCOUNTER — Other Ambulatory Visit: Payer: Self-pay

## 2022-09-12 ENCOUNTER — Emergency Department
Admission: EM | Admit: 2022-09-12 | Discharge: 2022-09-12 | Disposition: A | Discharge status: Home / Self Care | Payer: PPO | Source: Home / Self Care | Attending: Emergency Medicine | Admitting: Emergency Medicine

## 2022-09-12 DIAGNOSIS — J9602 Acute respiratory failure with hypercapnia: Secondary | ICD-10-CM | POA: Diagnosis not present

## 2022-09-12 DIAGNOSIS — R918 Other nonspecific abnormal finding of lung field: Secondary | ICD-10-CM | POA: Diagnosis not present

## 2022-09-12 DIAGNOSIS — J9601 Acute respiratory failure with hypoxia: Secondary | ICD-10-CM | POA: Insufficient documentation

## 2022-09-12 DIAGNOSIS — R0902 Hypoxemia: Secondary | ICD-10-CM | POA: Diagnosis not present

## 2022-09-12 DIAGNOSIS — Z20822 Contact with and (suspected) exposure to covid-19: Secondary | ICD-10-CM | POA: Insufficient documentation

## 2022-09-12 DIAGNOSIS — E1122 Type 2 diabetes mellitus with diabetic chronic kidney disease: Secondary | ICD-10-CM | POA: Insufficient documentation

## 2022-09-12 DIAGNOSIS — I509 Heart failure, unspecified: Secondary | ICD-10-CM | POA: Insufficient documentation

## 2022-09-12 DIAGNOSIS — R0602 Shortness of breath: Secondary | ICD-10-CM

## 2022-09-12 DIAGNOSIS — N1832 Acute kidney failure, unspecified: Secondary | ICD-10-CM

## 2022-09-12 DIAGNOSIS — I120 Hypertensive chronic kidney disease with stage 5 chronic kidney disease or end stage renal disease: Secondary | ICD-10-CM | POA: Diagnosis not present

## 2022-09-12 DIAGNOSIS — R0989 Other specified symptoms and signs involving the circulatory and respiratory systems: Secondary | ICD-10-CM | POA: Diagnosis not present

## 2022-09-12 DIAGNOSIS — Z992 Dependence on renal dialysis: Secondary | ICD-10-CM | POA: Insufficient documentation

## 2022-09-12 DIAGNOSIS — N1831 Chronic kidney disease, stage 3a: Secondary | ICD-10-CM | POA: Diagnosis not present

## 2022-09-12 DIAGNOSIS — N184 Chronic kidney disease, stage 4 (severe): Secondary | ICD-10-CM | POA: Diagnosis not present

## 2022-09-12 DIAGNOSIS — I129 Hypertensive chronic kidney disease with stage 1 through stage 4 chronic kidney disease, or unspecified chronic kidney disease: Secondary | ICD-10-CM | POA: Diagnosis not present

## 2022-09-12 DIAGNOSIS — N186 End stage renal disease: Secondary | ICD-10-CM | POA: Diagnosis not present

## 2022-09-12 DIAGNOSIS — N179 Acute kidney failure, unspecified: Secondary | ICD-10-CM | POA: Diagnosis not present

## 2022-09-12 DIAGNOSIS — I517 Cardiomegaly: Secondary | ICD-10-CM | POA: Diagnosis not present

## 2022-09-12 DIAGNOSIS — E875 Hyperkalemia: Secondary | ICD-10-CM | POA: Diagnosis not present

## 2022-09-12 DIAGNOSIS — I130 Hypertensive heart and chronic kidney disease with heart failure and stage 1 through stage 4 chronic kidney disease, or unspecified chronic kidney disease: Secondary | ICD-10-CM | POA: Insufficient documentation

## 2022-09-12 LAB — CBC WITH DIFFERENTIAL/PLATELET
Abs Immature Granulocytes: 0.02 10*3/uL (ref 0.00–0.07)
Basophils Absolute: 0 10*3/uL (ref 0.0–0.1)
Basophils Relative: 0 %
Eosinophils Absolute: 0 10*3/uL (ref 0.0–0.5)
Eosinophils Relative: 0 %
HCT: 44.6 % (ref 39.0–52.0)
Hemoglobin: 14.1 g/dL (ref 13.0–17.0)
Immature Granulocytes: 1 %
Lymphocytes Relative: 15 %
Lymphs Abs: 0.5 10*3/uL — ABNORMAL LOW (ref 0.7–4.0)
MCH: 30.2 pg (ref 26.0–34.0)
MCHC: 31.6 g/dL (ref 30.0–36.0)
MCV: 95.5 fL (ref 80.0–100.0)
Monocytes Absolute: 0.4 10*3/uL (ref 0.1–1.0)
Monocytes Relative: 13 %
Neutro Abs: 2.2 10*3/uL (ref 1.7–7.7)
Neutrophils Relative %: 71 %
Platelets: 110 10*3/uL — ABNORMAL LOW (ref 150–400)
RBC: 4.67 MIL/uL (ref 4.22–5.81)
RDW: 14.3 % (ref 11.5–15.5)
Smear Review: NORMAL
WBC: 3.1 10*3/uL — ABNORMAL LOW (ref 4.0–10.5)
nRBC: 5.2 % — ABNORMAL HIGH (ref 0.0–0.2)

## 2022-09-12 LAB — MAGNESIUM: Magnesium: 2.2 mg/dL (ref 1.7–2.4)

## 2022-09-12 LAB — COMPREHENSIVE METABOLIC PANEL
ALT: 42 U/L (ref 0–44)
AST: 54 U/L — ABNORMAL HIGH (ref 15–41)
Albumin: 3 g/dL — ABNORMAL LOW (ref 3.5–5.0)
Alkaline Phosphatase: 164 U/L — ABNORMAL HIGH (ref 38–126)
Anion gap: 9 (ref 5–15)
BUN: 80 mg/dL — ABNORMAL HIGH (ref 6–20)
CO2: 23 mmol/L (ref 22–32)
Calcium: 8.6 mg/dL — ABNORMAL LOW (ref 8.9–10.3)
Chloride: 90 mmol/L — ABNORMAL LOW (ref 98–111)
Creatinine, Ser: 4.01 mg/dL — ABNORMAL HIGH (ref 0.61–1.24)
GFR, Estimated: 17 mL/min — ABNORMAL LOW (ref >60)
Glucose, Bld: 142 mg/dL — ABNORMAL HIGH (ref 70–99)
Potassium: 5.9 mmol/L — ABNORMAL HIGH (ref 3.5–5.1)
Sodium: 122 mmol/L — ABNORMAL LOW (ref 135–145)
Total Bilirubin: 1.3 mg/dL — ABNORMAL HIGH (ref 0.3–1.2)
Total Protein: 6.8 g/dL (ref 6.5–8.1)

## 2022-09-12 LAB — BRAIN NATRIURETIC PEPTIDE: B Natriuretic Peptide: 3559.6 pg/mL — ABNORMAL HIGH (ref 0.0–100.0)

## 2022-09-12 LAB — TROPONIN I (HIGH SENSITIVITY): Troponin I (High Sensitivity): 25 ng/L — ABNORMAL HIGH (ref <18)

## 2022-09-12 LAB — SARS CORONAVIRUS 2 BY RT PCR: SARS Coronavirus 2 by RT PCR: NEGATIVE

## 2022-09-12 MED ORDER — IPRATROPIUM-ALBUTEROL 0.5-2.5 (3) MG/3ML IN SOLN
6.0000 mL | Freq: Once | RESPIRATORY_TRACT | Status: AC
  Administered 2022-09-12: 6 mL via RESPIRATORY_TRACT
  Filled 2022-09-12: qty 6
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
## 2022-09-12 NOTE — ED Provider Notes (Addendum)
Procedures  Clinical Course as of 09/12/22 0756  Sat Sep 12, 2022  4098 Reassessed breathing treatment ongoing [DS]    Clinical Course User Index [DS] Delton Prairie, MD    ----------------------------------------- 7:56 AM on 09/12/2022 ----------------------------------------- Labs show AKI on CKD with a GFR of 17, hyponatremia, mild hyperkalemia.  Patient remains hypoxic about 83% on room air.  I discussed the results with the patient, recommended admission to the hospital, which she refuses.  He believes that his condition is overall okay and attributes his worsening symptoms to only taking half dose of his fluid pill and drinking a lot of beer to get drunk enough to go to sleep at home recently.  He is AOx3, linear rational thought process, intact memory, not delusional.  He has medical decision-making capacity.  He reports a plan to follow-up with his doctor on Monday in 2 days.  I warned him his condition may worsen causing permanent disability or death before then, which he acknowledges.  He will be discharged AGAINST MEDICAL ADVICE.  Final diagnoses:  Hypoxia  Shortness of breath  Acute renal failure superimposed on stage 3b chronic kidney disease, unspecified acute renal failure type (HCC)  Acute respiratory failure with hypoxia [facility])        Sharman Cheek, MD 09/12/22 1191    Sharman Cheek, MD 09/12/22 0800

## 2022-09-12 NOTE — Discharge Instructions (Signed)
Your tests today show kidney failure and fluid in your lungs with a low oxygen level.  We recommend staying in the hospital to prevent worsening of your condition which can cause permanent disability or death, but you declined at this time.  Please return to the ER at any time if you change your mind and wish to continue medical treatment.  At home, continue taking all of your medications as prescribed, and follow up with your doctor or the heart failure clinic on Monday.

## 2022-09-12 NOTE — ED Notes (Signed)
EDP Stafford spoke at length with pt and recommended hospital admission. Pt refuses and opts to leave AMA. Pt signed AMA form and this RN signed as witness. Pt continues to desaturate into mid to high 80s on room air. These findings and risks of hypoxia were explained to pt by EDP.

## 2022-09-13 ENCOUNTER — Emergency Department
Admission: EM | Admit: 2022-09-13 | Discharge: 2022-09-13 | Discharge status: Against Medical Advice | Payer: PPO | Source: Home / Self Care | Attending: Emergency Medicine | Admitting: Emergency Medicine

## 2022-09-13 ENCOUNTER — Other Ambulatory Visit: Payer: Self-pay

## 2022-09-13 ENCOUNTER — Inpatient Hospital Stay
Admission: EM | Admit: 2022-09-13 | Discharge: 2022-09-27 | DRG: 682 | Disposition: E | Payer: PPO | Attending: Internal Medicine | Admitting: Internal Medicine

## 2022-09-13 ENCOUNTER — Encounter: Payer: Self-pay | Admitting: Internal Medicine

## 2022-09-13 ENCOUNTER — Emergency Department: Payer: PPO

## 2022-09-13 ENCOUNTER — Encounter: Payer: Self-pay | Admitting: Emergency Medicine

## 2022-09-13 DIAGNOSIS — Z5329 Procedure and treatment not carried out because of patient's decision for other reasons: Secondary | ICD-10-CM | POA: Diagnosis present

## 2022-09-13 DIAGNOSIS — R4781 Slurred speech: Secondary | ICD-10-CM | POA: Diagnosis present

## 2022-09-13 DIAGNOSIS — E871 Hypo-osmolality and hyponatremia: Secondary | ICD-10-CM | POA: Insufficient documentation

## 2022-09-13 DIAGNOSIS — R0989 Other specified symptoms and signs involving the circulatory and respiratory systems: Secondary | ICD-10-CM | POA: Diagnosis not present

## 2022-09-13 DIAGNOSIS — I4510 Unspecified right bundle-branch block: Secondary | ICD-10-CM | POA: Diagnosis present

## 2022-09-13 DIAGNOSIS — E875 Hyperkalemia: Secondary | ICD-10-CM | POA: Insufficient documentation

## 2022-09-13 DIAGNOSIS — Z1152 Encounter for screening for COVID-19: Secondary | ICD-10-CM

## 2022-09-13 DIAGNOSIS — Z83511 Family history of glaucoma: Secondary | ICD-10-CM

## 2022-09-13 DIAGNOSIS — N183 Chronic kidney disease, stage 3 unspecified: Secondary | ICD-10-CM | POA: Diagnosis present

## 2022-09-13 DIAGNOSIS — N179 Acute kidney failure, unspecified: Principal | ICD-10-CM

## 2022-09-13 DIAGNOSIS — I469 Cardiac arrest, cause unspecified: Secondary | ICD-10-CM | POA: Insufficient documentation

## 2022-09-13 DIAGNOSIS — G8929 Other chronic pain: Secondary | ICD-10-CM | POA: Diagnosis present

## 2022-09-13 DIAGNOSIS — Z811 Family history of alcohol abuse and dependence: Secondary | ICD-10-CM

## 2022-09-13 DIAGNOSIS — N1832 Chronic kidney disease, stage 3b: Secondary | ICD-10-CM | POA: Insufficient documentation

## 2022-09-13 DIAGNOSIS — N1831 Chronic kidney disease, stage 3a: Secondary | ICD-10-CM | POA: Diagnosis not present

## 2022-09-13 DIAGNOSIS — E785 Hyperlipidemia, unspecified: Secondary | ICD-10-CM | POA: Diagnosis present

## 2022-09-13 DIAGNOSIS — R7401 Elevation of levels of liver transaminase levels: Secondary | ICD-10-CM | POA: Diagnosis present

## 2022-09-13 DIAGNOSIS — G9341 Metabolic encephalopathy: Secondary | ICD-10-CM | POA: Diagnosis present

## 2022-09-13 DIAGNOSIS — R918 Other nonspecific abnormal finding of lung field: Secondary | ICD-10-CM | POA: Diagnosis not present

## 2022-09-13 DIAGNOSIS — E8729 Other acidosis: Secondary | ICD-10-CM | POA: Diagnosis present

## 2022-09-13 DIAGNOSIS — Z888 Allergy status to other drugs, medicaments and biological substances status: Secondary | ICD-10-CM

## 2022-09-13 DIAGNOSIS — I5033 Acute on chronic diastolic (congestive) heart failure: Secondary | ICD-10-CM | POA: Diagnosis present

## 2022-09-13 DIAGNOSIS — R6520 Severe sepsis without septic shock: Secondary | ICD-10-CM | POA: Insufficient documentation

## 2022-09-13 DIAGNOSIS — Z79899 Other long term (current) drug therapy: Secondary | ICD-10-CM

## 2022-09-13 DIAGNOSIS — E1122 Type 2 diabetes mellitus with diabetic chronic kidney disease: Secondary | ICD-10-CM | POA: Diagnosis present

## 2022-09-13 DIAGNOSIS — M109 Gout, unspecified: Secondary | ICD-10-CM | POA: Diagnosis present

## 2022-09-13 DIAGNOSIS — R0602 Shortness of breath: Secondary | ICD-10-CM | POA: Diagnosis not present

## 2022-09-13 DIAGNOSIS — Z515 Encounter for palliative care: Secondary | ICD-10-CM

## 2022-09-13 DIAGNOSIS — R579 Shock, unspecified: Secondary | ICD-10-CM | POA: Insufficient documentation

## 2022-09-13 DIAGNOSIS — I440 Atrioventricular block, first degree: Secondary | ICD-10-CM | POA: Diagnosis present

## 2022-09-13 DIAGNOSIS — J9811 Atelectasis: Secondary | ICD-10-CM | POA: Diagnosis present

## 2022-09-13 DIAGNOSIS — Z8249 Family history of ischemic heart disease and other diseases of the circulatory system: Secondary | ICD-10-CM

## 2022-09-13 DIAGNOSIS — E861 Hypovolemia: Secondary | ICD-10-CM | POA: Diagnosis not present

## 2022-09-13 DIAGNOSIS — K8020 Calculus of gallbladder without cholecystitis without obstruction: Secondary | ICD-10-CM | POA: Diagnosis present

## 2022-09-13 DIAGNOSIS — N189 Chronic kidney disease, unspecified: Secondary | ICD-10-CM | POA: Diagnosis present

## 2022-09-13 DIAGNOSIS — I129 Hypertensive chronic kidney disease with stage 1 through stage 4 chronic kidney disease, or unspecified chronic kidney disease: Secondary | ICD-10-CM | POA: Diagnosis not present

## 2022-09-13 DIAGNOSIS — G894 Chronic pain syndrome: Secondary | ICD-10-CM | POA: Diagnosis present

## 2022-09-13 DIAGNOSIS — Z833 Family history of diabetes mellitus: Secondary | ICD-10-CM

## 2022-09-13 DIAGNOSIS — R0902 Hypoxemia: Secondary | ICD-10-CM | POA: Diagnosis not present

## 2022-09-13 DIAGNOSIS — R410 Disorientation, unspecified: Secondary | ICD-10-CM | POA: Diagnosis present

## 2022-09-13 DIAGNOSIS — D631 Anemia in chronic kidney disease: Secondary | ICD-10-CM | POA: Diagnosis present

## 2022-09-13 DIAGNOSIS — J189 Pneumonia, unspecified organism: Secondary | ICD-10-CM | POA: Diagnosis present

## 2022-09-13 DIAGNOSIS — N186 End stage renal disease: Secondary | ICD-10-CM | POA: Diagnosis not present

## 2022-09-13 DIAGNOSIS — R069 Unspecified abnormalities of breathing: Secondary | ICD-10-CM | POA: Diagnosis not present

## 2022-09-13 DIAGNOSIS — J45909 Unspecified asthma, uncomplicated: Secondary | ICD-10-CM | POA: Diagnosis present

## 2022-09-13 DIAGNOSIS — I5022 Chronic systolic (congestive) heart failure: Secondary | ICD-10-CM | POA: Diagnosis present

## 2022-09-13 DIAGNOSIS — J9601 Acute respiratory failure with hypoxia: Secondary | ICD-10-CM | POA: Insufficient documentation

## 2022-09-13 DIAGNOSIS — N184 Chronic kidney disease, stage 4 (severe): Secondary | ICD-10-CM | POA: Diagnosis present

## 2022-09-13 DIAGNOSIS — R188 Other ascites: Secondary | ICD-10-CM | POA: Diagnosis present

## 2022-09-13 DIAGNOSIS — D696 Thrombocytopenia, unspecified: Secondary | ICD-10-CM | POA: Diagnosis present

## 2022-09-13 DIAGNOSIS — I2489 Other forms of acute ischemic heart disease: Secondary | ICD-10-CM | POA: Diagnosis present

## 2022-09-13 DIAGNOSIS — Z66 Do not resuscitate: Secondary | ICD-10-CM | POA: Diagnosis not present

## 2022-09-13 DIAGNOSIS — A419 Sepsis, unspecified organism: Secondary | ICD-10-CM | POA: Diagnosis not present

## 2022-09-13 DIAGNOSIS — I517 Cardiomegaly: Secondary | ICD-10-CM | POA: Diagnosis not present

## 2022-09-13 DIAGNOSIS — Z6841 Body Mass Index (BMI) 40.0 and over, adult: Secondary | ICD-10-CM

## 2022-09-13 DIAGNOSIS — Z9911 Dependence on respirator [ventilator] status: Secondary | ICD-10-CM

## 2022-09-13 DIAGNOSIS — I959 Hypotension, unspecified: Secondary | ICD-10-CM | POA: Diagnosis not present

## 2022-09-13 DIAGNOSIS — R0603 Acute respiratory distress: Secondary | ICD-10-CM | POA: Diagnosis not present

## 2022-09-13 DIAGNOSIS — I120 Hypertensive chronic kidney disease with stage 5 chronic kidney disease or end stage renal disease: Secondary | ICD-10-CM | POA: Diagnosis not present

## 2022-09-13 DIAGNOSIS — J9602 Acute respiratory failure with hypercapnia: Secondary | ICD-10-CM | POA: Diagnosis not present

## 2022-09-13 DIAGNOSIS — J810 Acute pulmonary edema: Secondary | ICD-10-CM

## 2022-09-13 DIAGNOSIS — D72819 Decreased white blood cell count, unspecified: Secondary | ICD-10-CM | POA: Diagnosis present

## 2022-09-13 DIAGNOSIS — I420 Dilated cardiomyopathy: Secondary | ICD-10-CM | POA: Diagnosis present

## 2022-09-13 HISTORY — DX: Heart failure, unspecified: I50.9

## 2022-09-13 HISTORY — DX: Dyspnea, unspecified: R06.00

## 2022-09-13 HISTORY — DX: Anemia, unspecified: D64.9

## 2022-09-13 LAB — BASIC METABOLIC PANEL
Anion gap: 11 (ref 5–15)
Anion gap: 9 (ref 5–15)
BUN: 88 mg/dL — ABNORMAL HIGH (ref 6–20)
BUN: 90 mg/dL — ABNORMAL HIGH (ref 6–20)
CO2: 22 mmol/L (ref 22–32)
CO2: 28 mmol/L (ref 22–32)
Calcium: 8.7 mg/dL — ABNORMAL LOW (ref 8.9–10.3)
Calcium: 8.7 mg/dL — ABNORMAL LOW (ref 8.9–10.3)
Chloride: 89 mmol/L — ABNORMAL LOW (ref 98–111)
Chloride: 89 mmol/L — ABNORMAL LOW (ref 98–111)
Creatinine, Ser: 4.52 mg/dL — ABNORMAL HIGH (ref 0.61–1.24)
Creatinine, Ser: 5.08 mg/dL — ABNORMAL HIGH (ref 0.61–1.24)
GFR, Estimated: 15 mL/min — ABNORMAL LOW (ref >60)
GFR, Estimated: 13 mL/min — ABNORMAL LOW (ref >60)
Glucose, Bld: 164 mg/dL — ABNORMAL HIGH (ref 70–99)
Glucose, Bld: 135 mg/dL — ABNORMAL HIGH (ref 70–99)
Potassium: 5.8 mmol/L — ABNORMAL HIGH (ref 3.5–5.1)
Potassium: 5.7 mmol/L — ABNORMAL HIGH (ref 3.5–5.1)
Sodium: 122 mmol/L — ABNORMAL LOW (ref 135–145)
Sodium: 126 mmol/L — ABNORMAL LOW (ref 135–145)

## 2022-09-13 LAB — CBC
HCT: 45.9 % (ref 39.0–52.0)
HCT: 47.9 % (ref 39.0–52.0)
Hemoglobin: 14.2 g/dL (ref 13.0–17.0)
Hemoglobin: 14.8 g/dL (ref 13.0–17.0)
MCH: 29.5 pg (ref 26.0–34.0)
MCH: 30 pg (ref 26.0–34.0)
MCHC: 30.9 g/dL (ref 30.0–36.0)
MCHC: 30.9 g/dL (ref 30.0–36.0)
MCV: 95.2 fL (ref 80.0–100.0)
MCV: 97.2 fL (ref 80.0–100.0)
Platelets: 110 10*3/uL — ABNORMAL LOW (ref 150–400)
Platelets: 116 10*3/uL — ABNORMAL LOW (ref 150–400)
RBC: 4.82 MIL/uL (ref 4.22–5.81)
RBC: 4.93 MIL/uL (ref 4.22–5.81)
RDW: 14.3 % (ref 11.5–15.5)
RDW: 14.5 % (ref 11.5–15.5)
WBC: 3.4 10*3/uL — ABNORMAL LOW (ref 4.0–10.5)
WBC: 11.8 10*3/uL — ABNORMAL HIGH (ref 4.0–10.5)
nRBC: 7.3 % — ABNORMAL HIGH (ref 0.0–0.2)
nRBC: 3.3 % — ABNORMAL HIGH (ref 0.0–0.2)

## 2022-09-13 LAB — TROPONIN I (HIGH SENSITIVITY)
Troponin I (High Sensitivity): 35 ng/L — ABNORMAL HIGH (ref <18)
Troponin I (High Sensitivity): 27 ng/L — ABNORMAL HIGH (ref <18)
Troponin I (High Sensitivity): 28 ng/L — ABNORMAL HIGH (ref <18)

## 2022-09-13 LAB — BLOOD GAS, VENOUS
Acid-base deficit: 1.8 mmol/L (ref 0.0–2.0)
Bicarbonate: 29.9 mmol/L — ABNORMAL HIGH (ref 20.0–28.0)
Patient temperature: 37
pCO2, Ven: 90 mmHg (ref 44–60)
pH, Ven: 7.13 — CL (ref 7.25–7.43)

## 2022-09-13 MED ORDER — SODIUM ZIRCONIUM CYCLOSILICATE 10 G PO PACK
10.0000 g | PACK | Freq: Once | ORAL | Status: AC
  Administered 2022-09-13: 10 g via ORAL
  Filled 2022-09-13: qty 1

## 2022-09-13 MED ORDER — ACETAMINOPHEN 500 MG PO TABS
1000.0000 mg | ORAL_TABLET | Freq: Once | ORAL | Status: AC
  Administered 2022-09-13: 1000 mg via ORAL
  Filled 2022-09-13: qty 2

## 2022-09-13 MED ORDER — FUROSEMIDE 10 MG/ML IJ SOLN
80.0000 mg | Freq: Once | INTRAMUSCULAR | Status: AC
  Administered 2022-09-13: 80 mg via INTRAVENOUS
  Filled 2022-09-13: qty 8

## 2022-09-13 NOTE — ED Notes (Signed)
[facility] 

## 2022-09-13 NOTE — ED Provider Notes (Signed)
[facility] Provider Note    Event Date/Time   First MD Initiated Contact with Patient 09/13/22 2033     (approximate)   History   Chief Complaint Shortness of Breath   HPI  [name] is a 57 y.o. male with past medical history of hypertension, hyperlipidemia, diabetes, CKD, CHF, and chronic pain syndrome who presents to the ED complaining of shortness of breath.  Patient reports that he has been increasingly short of breath today and that he would like some oxygen as well as a breathing treatment.  He states that he is scheduled to see his kidney doctor tomorrow, has been told he has extra fluid on his body.  He states he only wants to stay for a breathing treatment and some oxygen, states he does not have oxygen at home.  Patient reportedly found to be 88% on room air with EMS, placed on 6 L nasal cannula with improvement to 92%.  Patient denies any fevers, cough, or chest pain.  He does state that his legs have been increasingly swollen and he has only urinated a couple of times in the past 24 hours.     Physical Exam   Triage Vital Signs: ED Triage Vitals  Encounter Vitals Group     BP 09/13/22 1849 95/73     Systolic BP Percentile --      Diastolic BP Percentile --      Pulse Rate 09/13/22 1849 (!) 110     Resp 09/13/22 1849 20     Temp 09/13/22 1849 98.1 F (36.7 C)     Temp Source 09/13/22 1849 Oral     SpO2 09/13/22 1848 93 %     Weight 09/13/22 1849 (!) 348 lb 15.8 oz (158.3 kg)     Height 09/13/22 1849 5\' 11"  (1.803 m)     Head Circumference --      Peak Flow --      Pain Score 09/13/22 1849 0     Pain Loc --      Pain Education --      Exclude from Growth Chart --     Most recent vital signs: Vitals:   09/13/22 1849 09/13/22 2045  BP: 95/73 110/78  Pulse: (!) 110 (!) 103  Resp: 20 (!) 21  Temp: 98.1 F (36.7 C)   SpO2: 93% 97%    Constitutional: Awake and alert. Eyes: Conjunctivae are normal. Head: Atraumatic. Nose:  No congestion/rhinnorhea. Mouth/Throat: Mucous membranes are moist.  Cardiovascular: Tachycardic, regular rhythm. Grossly normal heart sounds.  2+ radial pulses bilaterally. Respiratory: Tachypneic with increased respiratory effort.  No retractions. Lungs with crackles throughout. Gastrointestinal: Soft and nontender. No distention. Musculoskeletal: No lower extremity tenderness, 2+ pitting edema to knees bilaterally. Neurologic:  Normal speech and language. No gross focal neurologic deficits are appreciated.    ED Results / Procedures / Treatments   Labs (all labs ordered are listed, but only abnormal results are displayed) Labs Reviewed  BASIC METABOLIC PANEL - Abnormal; Notable for the following components:      Result Value   Sodium 126 (*)    Potassium 5.7 (*)    Chloride 89 (*)    Glucose, Bld 135 (*)    BUN 90 (*)    Creatinine, Ser 5.08 (*)    Calcium 8.7 (*)    GFR, Estimated 13 (*)    All other components within normal limits  CBC - Abnormal; Notable for the following components:   WBC  11.8 (*)    Platelets 116 (*)    nRBC 3.3 (*)    All other components within normal limits  BLOOD GAS, VENOUS - Abnormal; Notable for the following components:   pH, Ven 7.13 (*)    Bicarbonate 29.9 (*)    All other components within normal limits  TROPONIN I (HIGH SENSITIVITY) - Abnormal; Notable for the following components:   Troponin I (High Sensitivity) 27 (*)    All other components within normal limits  TROPONIN I (HIGH SENSITIVITY)     EKG  ED ECG REPORT I, Chesley Noon, the attending physician, personally viewed and interpreted this ECG.   Date: 09/13/2022  EKG Time: 18:59  Rate: 70  Rhythm: normal sinus rhythm  Axis: Normal  Intervals:first-degree A-V block  and right bundle branch block  ST&T Change: None  RADIOLOGY Chest x-ray reviewed and interpreted by me with pulmonary edema, no focal infiltrate or effusion noted.  PROCEDURES:  Critical Care  performed: Yes, see critical care procedure note(s)  .Critical Care  Performed by: Chesley Noon, MD Authorized by: Chesley Noon, MD   Critical care provider statement:    Critical care time (minutes):  30   Critical care time was exclusive of:  Separately billable procedures and treating other patients and teaching time   Critical care was necessary to treat or prevent imminent or life-threatening deterioration of the following conditions:  Respiratory failure   Critical care was time spent personally by me on the following activities:  Development of treatment plan with patient or surrogate, discussions with consultants, evaluation of patient's response to treatment, examination of patient, ordering and review of laboratory studies, ordering and review of radiographic studies, ordering and performing treatments and interventions, pulse oximetry, re-evaluation of patient's condition and review of old charts   I assumed direction of critical care for this patient from another provider in my specialty: no     Care discussed with: admitting provider      MEDICATIONS ORDERED IN ED: Medications  sodium zirconium cyclosilicate (LOKELMA) packet 10 g (has no administration in time range)  furosemide (LASIX) injection 80 mg (80 mg Intravenous Given 09/13/22 2114)     IMPRESSION / MDM / ASSESSMENT AND PLAN / ED COURSE  I reviewed the triage vital signs and the nursing notes.                              57 y.o. male with past medical history of hypertension, hyperlipidemia, diabetes, CKD, asthma, CHF, and chronic pain syndrome who presents to the ED for increasing difficulty breathing over the course of the day today, noted to be hypoxic with EMS.  Patient's presentation is most consistent with acute presentation with potential threat to life or bodily function.  Differential diagnosis includes, but is not limited to, hypoxic respiratory failure, hypercapnic respiratory failure, CHF, COPD,  anemia, electrolyte abnormality, AKI, pneumonia, pneumothorax.  Patient chronically ill-appearing with increased work of breathing, placed on 4 L nasal cannula with EMS and currently maintaining oxygen saturations on this.  He does appear significantly fluid overloaded with crackles throughout.  He is already stating that he would like to leave following a breathing treatment and application of oxygen.  I explained multiple times to the patient that he is at high risk of death if he does so, however he does not seem to understand his current medical issues.  He is repeatedly stating that all he needs to do  is see his nephrologist tomorrow, in my opinion he currently lacks capacity to make the decision to sign out AGAINST MEDICAL ADVICE.  He appears confused, likely due to commendation of uremia as well as hypercapnic respiratory failure.  He was placed under IVC to facilitate medical workup.  With his pCO2 of 90 per respiratory along with pH of 7.1, we will place him on BiPAP.  Chest x-ray with significant pulmonary edema, labs show worsening renal failure with associated hyperkalemia and hyponatremia.  No significant anemia or leukocytosis noted, troponin similar to prior baseline.  Case discussed with Dr. Cherylann Ratel of nephrology, who recommends 80 mg of IV Lasix and dose of Lokelma.  Case discussed with hospitalist for admission.      FINAL CLINICAL IMPRESSION(S) / ED DIAGNOSES   Final diagnoses:  Acute respiratory failure with hypoxia and hypercapnia (HCC)  ESRD (end stage renal disease) (HCC)  Hyperkalemia  Acute pulmonary edema (HCC)     Rx / DC Orders   ED Discharge Orders     None        Note:  This document was prepared using Dragon voice recognition software and may include unintentional dictation errors.   Chesley Noon, MD 09/13/22 2135

## 2022-09-13 NOTE — H&P (Signed)
History and Physical    Patient: [name]:[id number] DOB: 1965-11-18 DOA: 09/13/2022 DOS: the patient was seen and examined on 09/14/2022 PCP: Carren Rang, PA-C  Patient coming from: Home   Chief Complaint:  Chief Complaint  Patient presents with   Shortness of Breath    HPI: [name] is a 57 y.o. male with medical history significant for hypertension, hyperlipidemia, diabetes, morbid obesity, CKD, CHF, and chronic pain syndrome who presents to the ED complaining of shortness of breath.  Patient nephrology appointment tomorrow and initially came in for a breathing treatment for his shortness of breath patient was found to be hypoxic and started on's nasal cannula 6 L and admission was requested for respiratory failure with hypercapnia.  Patient was felt to be incompetent as he wanted to leave the ER and was IVC by EDMD. Patient lives at home with his mother. In the emergency room initial vitals showed tachycardia 0.7 respirations of 20 O2 sats of 93%.  Patient is not on oxygen at home. EKG shows sinus rhythm at 70 with a first-degree AV block and a right bundle branch block. Initial blood gas showed a pH of 7.13 and a pCO2 of 90.  Metabolic panel showed hyponatremia of 126, potassium of 5.7 glucose 135 CKD with a creatinine of 5.08, LFTs added and pending. TNI mildly elevated at 35, 27, 28 which I suspect is due to his end-stage renal disease.  CBC shows mild leukocytosis of 11.8 normal hemoglobin and thrombocytopenia with a platelet count of 116. Respiratory panel is negative for COVID. Chart review shows patient had echocardiogram in October 2023 showing: 1. Left ventricular ejection fraction, by estimation, is 60 to 65%. The  left ventricle has normal function. The left ventricle has no regional  wall motion abnormalities. Left ventricular diastolic parameters were  normal.   2. Right ventricular systolic function is normal. The right ventricular  size is  normal.   3. The mitral valve is normal in structure. Trivial mitral valve  regurgitation.   4. The aortic valve is normal in structure. Aortic valve regurgitation is  trivial.  In the emergency room patient received Lasix 80 mg per nephrology recommendation,  Patient also received Lokelma 10 g and Tylenol 1 g.  Review of Systems: Review of Systems  Unable to perform ROS: Acuity of condition  Respiratory:  Positive for shortness of breath.   Psychiatric/Behavioral:         Confusion    Past Medical History:  Diagnosis Date   Allergy    Anemia    Asthma    CHF (congestive heart failure) (HCC)    Diabetes mellitus without complication (HCC)    Dyspnea    Gout    Hyperlipidemia    Hypertension    Morbid obesity (HCC)    Past Surgical History:  Procedure Laterality Date   DIALYSIS/PERMA CATHETER INSERTION N/A 04/29/2022   Procedure: DIALYSIS/PERMA CATHETER INSERTION;  Surgeon: Annice Needy, MD;  Location: [facility];  Service: Cardiovascular;  Laterality: N/A;   DIALYSIS/PERMA CATHETER REMOVAL N/A 07/06/2022   Procedure: DIALYSIS/PERMA CATHETER REMOVAL;  Surgeon: Annice Needy, MD;  Location: [facility];  Service: Cardiovascular;  Laterality: N/A;   TEMPORARY DIALYSIS CATHETER Right 11/14/2021   Procedure: TEMPORARY DIALYSIS CATHETER;  Surgeon: Annice Needy, MD;  Location: [facility];  Service: Cardiovascular;  Laterality: Right;   TEMPORARY DIALYSIS CATHETER N/A 04/24/2022   Procedure: TEMPORARY DIALYSIS CATHETER;  Surgeon: Renford Dills,  MD;  Location: [facility];  Service: Cardiovascular;  Laterality: N/A;   Social History:   reports that he has never smoked. He has never used smokeless tobacco. He reports current alcohol use. He reports that he does not use drugs.  Allergies  Allergen Reactions   Talc     Nose bleeds    Family History  Problem Relation Age of Onset   Diabetes Mother    Hypertension Mother    Alcohol abuse  Father    Glaucoma Father    Diabetes Father    Hypertension Father    Cancer Neg Hx    COPD Neg Hx    Heart disease Neg Hx    Stroke Neg Hx     Prior to Admission medications   Medication Sig Start Date End Date Taking? Authorizing Provider  albuterol (PROVENTIL) (2.5 MG/3ML) 0.083% nebulizer solution Take 3 mLs (2.5 mg total) by nebulization every 4 (four) hours as needed for wheezing or shortness of breath. 09/09/22 09/09/23  Shaune Pollack, MD  albuterol (VENTOLIN HFA) 108 (90 Base) MCG/ACT inhaler Inhale 2 puffs into the lungs every 4 (four) hours as needed for wheezing or shortness of breath. 09/09/22   Shaune Pollack, MD  allopurinol (ZYLOPRIM) 100 MG tablet Take 2 tablets (200 mg total) by mouth daily. 03/18/22   Margarita Mail, DO  atorvastatin (LIPITOR) 20 MG tablet Take 1 tablet (20 mg total) by mouth daily. 12/11/21   [name], Netta Neat, DO  bumetanide (BUMEX) 1 MG tablet Take by mouth. Take 2 tablets (2 mg total) by mouth in the morning and 2 tablets (2 mg total) at noon and 2 tablets (2 mg total) in the evening. 02/27/20 12/11/22  [provider]  calcitRIOL (ROCALTROL) 0.5 MCG capsule Take 2 capsules (1 mcg total) by mouth 1 (one) time each day Patient not taking: Reported on 08/07/2022    [provider]  carvedilol (COREG) 25 MG tablet Take 2 tablets (50 mg total) by mouth 2 (two) times daily with a meal. 04/03/22   Margarita Mail, DO  cyanocobalamin (VITAMIN B12) 1000 MCG tablet Take 1 tablet (1,000 mcg total) by mouth daily. Patient not taking: Reported on 08/07/2022 12/15/21   [name], MD  cyclobenzaprine (FLEXERIL) 10 MG tablet Take 1 tablet (10 mg total) by mouth 3 (three) times daily as needed for muscle spasms. 12/24/21   [name], Netta Neat, DO  dicyclomine (BENTYL) 10 MG capsule Take 1 capsule (10 mg total) by mouth 3 (three) times daily before meals. 04/10/22   Margarita Mail, DO  esomeprazole (NEXIUM) 40 MG capsule Take 1 capsule  (40 mg total) by mouth 2 (two) times daily before a meal. 10/08/21   [name], Netta Neat, DO  ferrous gluconate (FERGON) 324 MG tablet Take 1 tablet (324 mg total) by mouth daily with breakfast. 09/04/22   [name], MD  FLUoxetine (PROZAC) 10 MG capsule Take 1 capsule (10 mg total) by mouth daily. 05/14/22   Berniece Salines, FNP  fluticasone (FLONASE) 50 MCG/ACT nasal spray Place 2 sprays into both nostrils daily. Use for 4-6 weeks then stop and use seasonally or as needed. Patient not taking: Reported on 08/07/2022 07/15/20   Smitty Cords, DO  gabapentin (NEURONTIN) 100 MG capsule  07/27/18   [provider]  hydrocortisone 1 % ointment Apply 1 Application topically 2 (two) times daily. Patient not taking: Reported on 08/07/2022 03/16/22   Margarita Mail, DO  lidocaine (LIDODERM) 5 % Place 1 patch onto  the skin every 12 (twelve) hours. Remove & Discard patch within 12 hours or as directed by MD Patient not taking: Reported on 08/07/2022 05/17/22 05/17/23  Chesley Noon, MD  metolazone (ZAROXOLYN) 5 MG tablet Take 1 tablet (5 mg total) by mouth 1 (one) time each day if needed (for weight gain)    [provider]  naloxegol oxalate (MOVANTIK) 12.5 MG TABS tablet Take 1 tablet (12.5 mg total) by mouth daily. Patient not taking: Reported on 08/07/2022 04/10/22   Margarita Mail, DO  oxyCODONE (OXY IR/ROXICODONE) 5 MG immediate release tablet Take 1 tablet (5 mg total) by mouth every 6 (six) hours as needed for severe pain. Patient not taking: Reported on 08/07/2022 01/22/22   Smitty Cords, DO  potassium chloride (KLOR-CON) 10 MEQ tablet Take 1 tablet (10 mEq total) by mouth daily. Patient not taking: Reported on 08/07/2022 01/22/22   Smitty Cords, DO  predniSONE (DELTASONE) 20 MG tablet Take 2 tablets (40 mg total) by mouth daily for 5 days. 09/09/22 09/14/22  Shaune Pollack, MD  simethicone (MYLICON) 125 MG chewable tablet Chew 125 mg by mouth every 6  (six) hours as needed for flatulence.    [provider]     Vitals:   09/13/22 1848 09/13/22 1849 09/13/22 2045 09/13/22 2313  BP:  95/73 110/78 (!) 121/91  Pulse:  (!) 110 (!) 103   Resp:  20 (!) 21 15  Temp:  98.1 F (36.7 C)    TempSrc:  Oral    SpO2: 93% 93% 97%   Weight:  (!) 158.3 kg    Height:  5\' 11"  (1.803 m)     Physical Exam Vitals reviewed.  Constitutional:      Appearance: He is obese. He is ill-appearing.  HENT:     Head: Normocephalic and atraumatic.  Eyes:     Extraocular Movements: Extraocular movements intact.  Cardiovascular:     Rate and Rhythm: Normal rate and regular rhythm.     Pulses:          Dorsalis pedis pulses are 1+ on the right side and 1+ on the left side.       Posterior tibial pulses are 1+ on the right side and 1+ on the left side.     Heart sounds: Normal heart sounds.  Pulmonary:     Breath sounds: Rales present.  Abdominal:     General: Bowel sounds are normal. There is no distension.     Palpations: Abdomen is soft.     Tenderness: There is no abdominal tenderness. There is no guarding.  Musculoskeletal:     Right lower leg: 2+ Edema present.     Left lower leg: 2+ Edema present.  Neurological:     Mental Status: He is alert.    Labs on Admission: I have personally reviewed following labs and imaging studies  CBC: Recent Labs  Lab 09/12/22 0625 09/13/22 0413 09/13/22 1852  WBC 3.1* 3.4* 11.8*  NEUTROABS 2.2  --   --   HGB 14.1 14.2 14.8  HCT 44.6 45.9 47.9  MCV 95.5 95.2 97.2  PLT 110* 110* 116*   Basic Metabolic Panel: Recent Labs  Lab 09/12/22 0625 09/13/22 0413 09/13/22 1852  NA 122* 122* 126*  K 5.9* 5.8* 5.7*  CL 90* 89* 89*  CO2 23 22 28   GLUCOSE 142* 164* 135*  BUN 80* 88* 90*  CREATININE 4.01* 4.52* 5.08*  CALCIUM 8.6* 8.7* 8.7*  MG 2.2  --   --  GFR: Estimated Creatinine Clearance: 25.2 mL/min (A) (by C-G formula based on SCr of 5.08 mg/dL (H)). Liver Function Tests: Recent Labs   Lab 09/12/22 0625 09/13/22 2058  AST 54* 35  ALT 42 48*  ALKPHOS 164* 159*  BILITOT 1.3* 1.2  PROT 6.8 7.0  ALBUMIN 3.0* 3.3*   No results for input(s): "LIPASE", "AMYLASE" in the last 168 hours. No results for input(s): "AMMONIA" in the last 168 hours. Coagulation Profile: No results for input(s): "INR", "PROTIME" in the last 168 hours. Cardiac Enzymes: No results for input(s): "CKTOTAL", "CKMB", "CKMBINDEX", "TROPONINI" in the last 168 hours. BNP (last 3 results) No results for input(s): "PROBNP" in the last 8760 hours. HbA1C: No results for input(s): "HGBA1C" in the last 72 hours. CBG: No results for input(s): "GLUCAP" in the last 168 hours. Lipid Profile: No results for input(s): "CHOL", "HDL", "LDLCALC", "TRIG", "CHOLHDL", "LDLDIRECT" in the last 72 hours. Thyroid Function Tests: No results for input(s): "TSH", "T4TOTAL", "FREET4", "T3FREE", "THYROIDAB" in the last 72 hours. Anemia Panel: No results for input(s): "VITAMINB12", "FOLATE", "FERRITIN", "TIBC", "IRON", "RETICCTPCT" in the last 72 hours. Urinalysis    Component Value Date/Time   COLORURINE YELLOW (A) 03/24/2022 1208   APPEARANCEUR CLEAR (A) 03/24/2022 1208   LABSPEC 1.012 03/24/2022 1208   PHURINE 6.0 03/24/2022 1208   GLUCOSEU NEGATIVE 03/24/2022 1208   HGBUR NEGATIVE 03/24/2022 1208   BILIRUBINUR NEGATIVE 03/24/2022 1208   KETONESUR NEGATIVE 03/24/2022 1208   PROTEINUR 100 (A) 03/24/2022 1208   NITRITE NEGATIVE 03/24/2022 1208   LEUKOCYTESUR NEGATIVE 03/24/2022 1208   Unresulted Labs (From admission, onward)     Start     Ordered   09/14/22 0143  Hemoglobin A1c  Add-on,   AD        09/14/22 0142   09/13/22 2134  Ethanol  Add-on,   AD        09/13/22 2133           Medications  atorvastatin (LIPITOR) tablet 20 mg (has no administration in time range)  allopurinol (ZYLOPRIM) tablet 200 mg (has no administration in time range)  albuterol (VENTOLIN HFA) 108 (90 Base) MCG/ACT inhaler 2 puff  (has no administration in time range)  carvedilol (COREG) tablet 50 mg (has no administration in time range)  FLUoxetine (PROZAC) capsule 10 mg (has no administration in time range)  fluticasone (FLONASE) 50 MCG/ACT nasal spray 2 spray (has no administration in time range)  predniSONE (DELTASONE) tablet 40 mg (has no administration in time range)  furosemide (LASIX) injection 40 mg (has no administration in time range)  sodium zirconium cyclosilicate (LOKELMA) packet 10 g (10 g Oral Given 09/13/22 2129)  furosemide (LASIX) injection 80 mg (80 mg Intravenous Given 09/13/22 2114)  acetaminophen (TYLENOL) tablet 1,000 mg (1,000 mg Oral Given 09/13/22 2357)   Radiological Exams on Admission: DG Chest 2 View  Result Date: 09/13/2022 CLINICAL DATA:  Shortness of breath. EXAM: CHEST - 2 VIEW COMPARISON:  09/13/2022 at 0433 hours. FINDINGS: Unchanged cardiomegaly and pulmonary venous congestion. Increased bibasilar airspace opacities. No definite pulmonary edema, pleural effusion or pneumothorax. IMPRESSION: Unchanged cardiomegaly and pulmonary venous congestion. Increased bibasilar airspace opacities. Electronically Signed   By: Orvan Falconer M.D.   On: 09/13/2022 19:48   DG Chest 2 View  Result Date: 09/13/2022 CLINICAL DATA:  Shortness of breath EXAM: CHEST - 2 VIEW COMPARISON:  Yesterday FINDINGS: Cardiomegaly and vascular pedicle widening with low lung volumes and worsening haziness at the bases. No pneumothorax. IMPRESSION: Low volume chest  with worsening aeration at the bases. Unchanged cardiomegaly and vascular congestion. Electronically Signed   By: Tiburcio Pea M.D.   On: 09/13/2022 05:35   DG Chest Portable 1 View  Result Date: 09/12/2022 CLINICAL DATA:  Shortness of breath EXAM: PORTABLE CHEST 1 VIEW COMPARISON:  05/17/2022 FINDINGS: Cardiomegaly and central vascular congestion. There may be trace pleural fluid. No edema or pneumothorax. IMPRESSION: Chronic cardiomegaly and central  vascular congestion. Electronically Signed   By: Tiburcio Pea M.D.   On: 09/12/2022 06:51     Data Reviewed: Relevant notes from primary care and specialist visits, past discharge summaries as available in EHR, including Care Everywhere. Prior diagnostic testing as pertinent to current admission diagnoses Updated medications and problem lists for reconciliation ED course, including vitals, labs, imaging, treatment and response to treatment Triage notes, nursing and pharmacy notes and ED provider's notes Notable results as noted in HPI  Assessment and Plan: * Respiratory distress 2/2 PVC on chest xray.  Continue with BiPAP. We will get v/q scan to identify VTE  as pt is at high risk for PE.    Acute respiratory failure with hypoxia and hypercapnia (HCC) Respiratory failure with hypoxia and hypercapnia requiring NIPPV. Is tolerating the mask.  And is coming around.  Is answering questions although it is difficult to understand and HPI is otherwise limited secondary to acuity of illness.  Patient is a limited historian.  Acute on chronic diastolic CHF (congestive heart failure) (HCC) Continue patient on his  Coreg and Lipitor. Continue patient on Lasix. Strict I/O  Confusion and disorientation 2/2 respiratory failure and hypercapnia. Continue with NIPPV. Continuous pulse oximetry. As needed albuterol.   Type 2 diabetes mellitus with stage 3 chronic kidney disease, without long-term current use of insulin (HCC) Patient's last A1c 6 months ago was 6.2 and diabetes is well-controlled. Will cover with glycemic coverage and sliding scale insulin limited regimen. Will obtain an A1c.  Consistent carb diet once patient passes a bedside swallow.  Anemia in stage 4 chronic kidney disease (HCC) CBC    Component Value Date/Time   WBC 11.8 (H) 09/13/2022 1852   RBC 4.93 09/13/2022 1852   HGB 14.8 09/13/2022 1852   HGB 14.4 01/17/2015 1120   HCT 47.9 09/13/2022 1852   HCT 47.6  01/17/2015 1120   PLT 116 (L) 09/13/2022 1852   PLT 167 01/17/2015 1120   MCV 97.2 09/13/2022 1852   MCV 91 01/17/2015 1120   MCH 30.0 09/13/2022 1852   MCHC 30.9 09/13/2022 1852   RDW 14.5 09/13/2022 1852   RDW 15.7 (H) 01/17/2015 1120   LYMPHSABS 0.5 (L) 09/12/2022 0625   LYMPHSABS 1.6 01/17/2015 1120   MONOABS 0.4 09/12/2022 0625   EOSABS 0.0 09/12/2022 0625   EOSABS 0.1 01/17/2015 1120   BASOSABS 0.0 09/12/2022 0625   BASOSABS 0.0 01/17/2015 1120  Resolved. We will follow.    CKD (chronic kidney disease), stage IV (HCC) Avoid Contrast study.  Renally dose meds. Nephrology Dr. Cherylann Ratel on board.   DVT prophylaxis:  Heparin   Consults:  None   Advance Care Planning:    Code Status: Prior   Family Communication:  Daughter.  Disposition Plan:   TBD.  Severity of Illness: The appropriate patient status for this patient is INPATIENT. Inpatient status is judged to be reasonable and necessary in order to provide the required intensity of service to ensure the patient's safety. The patient's presenting symptoms, physical exam findings, and initial radiographic and laboratory data in the context  of their chronic comorbidities is felt to place them at high risk for further clinical deterioration. Furthermore, it is not anticipated that the patient will be medically stable for discharge from the hospital within 2 midnights of admission.   * I certify that at the point of admission it is my clinical judgment that the patient will require inpatient hospital care spanning beyond 2 midnights from the point of admission due to high intensity of service, high risk for further deterioration and high frequency of surveillance required.*  Author: Gertha Calkin, MD 09/14/2022 2:20 AM  For on call review [email or web address].

## 2022-09-13 NOTE — ED Provider Notes (Signed)
[facility] Provider Note    Event Date/Time   First MD Initiated Contact with Patient 09/13/22 0730     (approximate)   History   Chief Complaint: Shortness of Breath   HPI  [name] is a 57 y.o. male with a history of dilated cardiomyopathy, CKD, morbid obesity who comes to the ED for shortness of breath.  Requests a nebulizer treatment.  States that he felt more short of breath last night due to sleeping in his mom's house which is not air conditioned.  I spoke with the patient yesterday during his ED visit yesterday at which time he refused admission for his hypoxic respiratory failure and acute on chronic renal insufficiency with hyponatremia.  Today he reports that he only wants a breathing treatment and then wants to be discharged and is not interested in hospitalization.  Denies any new complaints.     Physical Exam   Triage Vital Signs: ED Triage Vitals  Encounter Vitals Group     BP 09/13/22 0413 118/77     Systolic BP Percentile --      Diastolic BP Percentile --      Pulse Rate 09/13/22 0413 74     Resp 09/13/22 0413 18     Temp 09/13/22 0413 98.2 F (36.8 C)     Temp Source 09/13/22 0413 Oral     SpO2 09/13/22 0413 96 %     Weight 09/13/22 0421 (!) 348 lb 15.8 oz (158.3 kg)     Height 09/13/22 0421 5\' 11"  (1.803 m)     Head Circumference --      Peak Flow --      Pain Score 09/13/22 0421 0     Pain Loc --      Pain Education --      Exclude from Growth Chart --     Most recent vital signs: Vitals:   09/13/22 0413  BP: 118/77  Pulse: 74  Resp: 18  Temp: 98.2 F (36.8 C)  SpO2: 96%    General: Awake, no distress.  CV:  Good peripheral perfusion.  Resp:  Normal effort.  Abd:  No distention.  Other:  Sitting upright, talking in complete sentences and paragraphs   ED Results / Procedures / Treatments   Labs (all labs ordered are listed, but only abnormal results are displayed) Labs Reviewed  BASIC METABOLIC  PANEL - Abnormal; Notable for the following components:      Result Value   Sodium 122 (*)    Potassium 5.8 (*)    Chloride 89 (*)    Glucose, Bld 164 (*)    BUN 88 (*)    Creatinine, Ser 4.52 (*)    Calcium 8.7 (*)    GFR, Estimated 15 (*)    All other components within normal limits  CBC - Abnormal; Notable for the following components:   WBC 3.4 (*)    Platelets 110 (*)    nRBC 7.3 (*)    All other components within normal limits  TROPONIN I (HIGH SENSITIVITY) - Abnormal; Notable for the following components:   Troponin I (High Sensitivity) 35 (*)    All other components within normal limits  TROPONIN I (HIGH SENSITIVITY)     EKG    RADIOLOGY Chest x-ray shows increased haziness at the bases, likely edema.  Radiology report reviewed   PROCEDURES:  Procedures   MEDICATIONS ORDERED IN ED: Medications - No data to display   IMPRESSION / MDM /  ASSESSMENT AND PLAN / ED COURSE  I reviewed the triage vital signs and the nursing notes.  DDx: Renal failure, heart failure, pneumonia, COVID, pleural effusion  Patient's presentation is most consistent with severe exacerbation of chronic illness.  Labs show AKI on CKD with a GFR of 15, hyponatremia, mild hyperkalemia.  Patient remains hypoxic about 88% on room air.  I discussed the results with the patient, recommended admission to the hospital, which he refuses.  He believes that his condition is overall okay and attributes his worsening symptoms to only taking half dose of his fluid pill   He is AOx3, linear rational thought process, intact memory, not delusional.  He has medical decision-making capacity.  He reports a plan to follow-up with his doctor tomorrow on Monday.  I warned him his condition may worsen causing permanent disability or death before then, which he acknowledges.  He will be discharged AGAINST MEDICAL ADVICE.       FINAL CLINICAL IMPRESSION(S) / ED DIAGNOSES   Final diagnoses:  Acute renal failure  superimposed on stage 3b chronic kidney disease, unspecified acute renal failure type (HCC)  Acute respiratory failure with hypoxia (HCC)     Rx / DC Orders   ED Discharge Orders     None        Note:  This document was prepared using Dragon voice recognition software and may include unintentional dictation errors.   Sharman Cheek, MD 09/13/22 [phone]

## 2022-09-13 NOTE — ED Triage Notes (Signed)
Pt presents to triage via [facility] from home for SOB. Pt states he has been in warm conditions at home and has been having difficulty breathing. Pt speaking in full sentences at this time. Pt was placed on 4L [place] with EMS is 100%; decreased to 2L upon arrival and pt is 96%. A&Ox4 at this time. Denies CP

## 2022-09-13 NOTE — ED Notes (Signed)
Pt argumentative with plan of care upon entering the room. Stating he only wants oxygen and a breathing tx. MD in room and pt denying tx. Pt to leave AMA. VS obtained prior to discharge. Pt explained about risks of leaving AMA and continued to state intent to leave.MD notified of poor oxygen saturation prior to discharge.

## 2022-09-13 NOTE — ED Notes (Signed)
Brought pt to room from waiting room. EDP and primary RN were at bedside. Pt was refusing to get into bed, refusing to put hospital gown on. Pt insisting that "I just need 1 breathing treatment so I can go home".

## 2022-09-13 NOTE — ED Triage Notes (Addendum)
Pt arrives via EMS for SOB. Pt has been back to the ED multiple times today. Pt currently is on 6l/min at 92% per EMS. EMS sts that pt was at 88% on RA when they picked him up at home. Per Pt, Dr. Ella Jubilee instructed pt to come and be seen for possible extra fluid on his body. Pt sts that pt is not able to Dr. Ella Jubilee until tomorrow.

## 2022-09-13 NOTE — Hospital Course (Signed)
[phone] daughter.  Lives with mother.  IVC in ED.  Volume overload SOB.  Respiratory failure.  He lives with his mom. Spoke to daughter who is also limited historian.

## 2022-09-14 ENCOUNTER — Inpatient Hospital Stay: Payer: PPO

## 2022-09-14 DIAGNOSIS — J9601 Acute respiratory failure with hypoxia: Secondary | ICD-10-CM

## 2022-09-14 DIAGNOSIS — Z6841 Body Mass Index (BMI) 40.0 and over, adult: Secondary | ICD-10-CM | POA: Diagnosis not present

## 2022-09-14 DIAGNOSIS — I5033 Acute on chronic diastolic (congestive) heart failure: Secondary | ICD-10-CM | POA: Diagnosis not present

## 2022-09-14 DIAGNOSIS — J9600 Acute respiratory failure, unspecified whether with hypoxia or hypercapnia: Secondary | ICD-10-CM | POA: Diagnosis not present

## 2022-09-14 DIAGNOSIS — A419 Sepsis, unspecified organism: Secondary | ICD-10-CM | POA: Diagnosis not present

## 2022-09-14 DIAGNOSIS — N1832 Chronic kidney disease, stage 3b: Secondary | ICD-10-CM | POA: Diagnosis not present

## 2022-09-14 DIAGNOSIS — E875 Hyperkalemia: Secondary | ICD-10-CM | POA: Diagnosis not present

## 2022-09-14 DIAGNOSIS — D631 Anemia in chronic kidney disease: Secondary | ICD-10-CM | POA: Diagnosis not present

## 2022-09-14 DIAGNOSIS — E8729 Other acidosis: Secondary | ICD-10-CM | POA: Diagnosis not present

## 2022-09-14 DIAGNOSIS — J9602 Acute respiratory failure with hypercapnia: Secondary | ICD-10-CM | POA: Diagnosis not present

## 2022-09-14 DIAGNOSIS — Z452 Encounter for adjustment and management of vascular access device: Secondary | ICD-10-CM | POA: Diagnosis not present

## 2022-09-14 DIAGNOSIS — J189 Pneumonia, unspecified organism: Secondary | ICD-10-CM | POA: Diagnosis not present

## 2022-09-14 DIAGNOSIS — R0603 Acute respiratory distress: Secondary | ICD-10-CM | POA: Diagnosis not present

## 2022-09-14 DIAGNOSIS — J810 Acute pulmonary edema: Secondary | ICD-10-CM | POA: Diagnosis not present

## 2022-09-14 DIAGNOSIS — D72819 Decreased white blood cell count, unspecified: Secondary | ICD-10-CM | POA: Diagnosis not present

## 2022-09-14 DIAGNOSIS — I2489 Other forms of acute ischemic heart disease: Secondary | ICD-10-CM | POA: Diagnosis not present

## 2022-09-14 DIAGNOSIS — Z1152 Encounter for screening for COVID-19: Secondary | ICD-10-CM | POA: Diagnosis not present

## 2022-09-14 DIAGNOSIS — R188 Other ascites: Secondary | ICD-10-CM | POA: Diagnosis not present

## 2022-09-14 DIAGNOSIS — Z66 Do not resuscitate: Secondary | ICD-10-CM | POA: Diagnosis not present

## 2022-09-14 DIAGNOSIS — J45909 Unspecified asthma, uncomplicated: Secondary | ICD-10-CM | POA: Diagnosis not present

## 2022-09-14 DIAGNOSIS — I420 Dilated cardiomyopathy: Secondary | ICD-10-CM | POA: Diagnosis not present

## 2022-09-14 DIAGNOSIS — R6520 Severe sepsis without septic shock: Secondary | ICD-10-CM | POA: Diagnosis not present

## 2022-09-14 DIAGNOSIS — J90 Pleural effusion, not elsewhere classified: Secondary | ICD-10-CM | POA: Diagnosis not present

## 2022-09-14 DIAGNOSIS — E871 Hypo-osmolality and hyponatremia: Secondary | ICD-10-CM | POA: Diagnosis not present

## 2022-09-14 DIAGNOSIS — N179 Acute kidney failure, unspecified: Secondary | ICD-10-CM | POA: Diagnosis not present

## 2022-09-14 DIAGNOSIS — J9811 Atelectasis: Secondary | ICD-10-CM | POA: Diagnosis not present

## 2022-09-14 DIAGNOSIS — R918 Other nonspecific abnormal finding of lung field: Secondary | ICD-10-CM | POA: Diagnosis not present

## 2022-09-14 DIAGNOSIS — Z4682 Encounter for fitting and adjustment of non-vascular catheter: Secondary | ICD-10-CM | POA: Diagnosis not present

## 2022-09-14 DIAGNOSIS — G9341 Metabolic encephalopathy: Secondary | ICD-10-CM | POA: Diagnosis not present

## 2022-09-14 DIAGNOSIS — N186 End stage renal disease: Secondary | ICD-10-CM | POA: Diagnosis not present

## 2022-09-14 DIAGNOSIS — D696 Thrombocytopenia, unspecified: Secondary | ICD-10-CM | POA: Diagnosis not present

## 2022-09-14 DIAGNOSIS — Z515 Encounter for palliative care: Secondary | ICD-10-CM | POA: Diagnosis not present

## 2022-09-14 DIAGNOSIS — E1122 Type 2 diabetes mellitus with diabetic chronic kidney disease: Secondary | ICD-10-CM | POA: Diagnosis not present

## 2022-09-14 DIAGNOSIS — I517 Cardiomegaly: Secondary | ICD-10-CM | POA: Diagnosis not present

## 2022-09-14 DIAGNOSIS — K8020 Calculus of gallbladder without cholecystitis without obstruction: Secondary | ICD-10-CM | POA: Diagnosis not present

## 2022-09-14 LAB — BLOOD GAS, VENOUS
Acid-base deficit: 4.6 mmol/L — ABNORMAL HIGH (ref 0.0–2.0)
Acid-base deficit: 4 mmol/L — ABNORMAL HIGH (ref 0.0–2.0)
Bicarbonate: 27 mmol/L (ref 20.0–28.0)
Bicarbonate: 26.4 mmol/L (ref 20.0–28.0)
Delivery systems: POSITIVE
O2 Saturation: 70.8 %
O2 Saturation: 86.3 %
Patient temperature: 37.2
Patient temperature: 37
pCO2, Ven: 86 mmHg (ref 44–60)
pCO2, Ven: 74 mmHg (ref 44–60)
pH, Ven: 7.11 — CL (ref 7.25–7.43)
pH, Ven: 7.16 — CL (ref 7.25–7.43)
pO2, Ven: 46 mmHg — ABNORMAL HIGH (ref 32–45)
pO2, Ven: 52 mmHg — ABNORMAL HIGH (ref 32–45)

## 2022-09-14 LAB — BASIC METABOLIC PANEL
Anion gap: 10 (ref 5–15)
BUN: 93 mg/dL — ABNORMAL HIGH (ref 6–20)
CO2: 25 mmol/L (ref 22–32)
Calcium: 8.8 mg/dL — ABNORMAL LOW (ref 8.9–10.3)
Chloride: 90 mmol/L — ABNORMAL LOW (ref 98–111)
Creatinine, Ser: 5.81 mg/dL — ABNORMAL HIGH (ref 0.61–1.24)
GFR, Estimated: 11 mL/min — ABNORMAL LOW (ref >60)
Glucose, Bld: 119 mg/dL — ABNORMAL HIGH (ref 70–99)
Potassium: 6.1 mmol/L — ABNORMAL HIGH (ref 3.5–5.1)
Sodium: 125 mmol/L — ABNORMAL LOW (ref 135–145)

## 2022-09-14 LAB — HEMOGLOBIN A1C
Hgb A1c MFr Bld: 7.1 % — ABNORMAL HIGH (ref 4.8–5.6)
Mean Plasma Glucose: 157.07 mg/dL

## 2022-09-14 LAB — CBC
HCT: 49.9 % (ref 39.0–52.0)
HCT: 47.3 % (ref 39.0–52.0)
Hemoglobin: 15.3 g/dL (ref 13.0–17.0)
Hemoglobin: 14.3 g/dL (ref 13.0–17.0)
MCH: 29.9 pg (ref 26.0–34.0)
MCH: 29.5 pg (ref 26.0–34.0)
MCHC: 30.7 g/dL (ref 30.0–36.0)
MCHC: 30.2 g/dL (ref 30.0–36.0)
MCV: 97.7 fL (ref 80.0–100.0)
MCV: 97.5 fL (ref 80.0–100.0)
Platelets: 94 10*3/uL — ABNORMAL LOW (ref 150–400)
Platelets: 100 10*3/uL — ABNORMAL LOW (ref 150–400)
RBC: 5.11 MIL/uL (ref 4.22–5.81)
RBC: 4.85 MIL/uL (ref 4.22–5.81)
RDW: 14.6 % (ref 11.5–15.5)
RDW: 14.7 % (ref 11.5–15.5)
WBC: 15.3 10*3/uL — ABNORMAL HIGH (ref 4.0–10.5)
WBC: 18.9 10*3/uL — ABNORMAL HIGH (ref 4.0–10.5)
nRBC: 1.3 % — ABNORMAL HIGH (ref 0.0–0.2)
nRBC: 1.6 % — ABNORMAL HIGH (ref 0.0–0.2)

## 2022-09-14 LAB — HEPATIC FUNCTION PANEL
ALT: 48 U/L — ABNORMAL HIGH (ref 0–44)
AST: 35 U/L (ref 15–41)
Albumin: 3.3 g/dL — ABNORMAL LOW (ref 3.5–5.0)
Alkaline Phosphatase: 159 U/L — ABNORMAL HIGH (ref 38–126)
Bilirubin, Direct: 0.6 mg/dL — ABNORMAL HIGH (ref 0.0–0.2)
Indirect Bilirubin: 0.6 mg/dL (ref 0.3–0.9)
Total Bilirubin: 1.2 mg/dL (ref 0.3–1.2)
Total Protein: 7 g/dL (ref 6.5–8.1)

## 2022-09-14 LAB — COMPREHENSIVE METABOLIC PANEL
ALT: 42 U/L (ref 0–44)
AST: 24 U/L (ref 15–41)
Albumin: 3 g/dL — ABNORMAL LOW (ref 3.5–5.0)
Alkaline Phosphatase: 132 U/L — ABNORMAL HIGH (ref 38–126)
Anion gap: 11 (ref 5–15)
BUN: 98 mg/dL — ABNORMAL HIGH (ref 6–20)
CO2: 24 mmol/L (ref 22–32)
Calcium: 8.5 mg/dL — ABNORMAL LOW (ref 8.9–10.3)
Chloride: 89 mmol/L — ABNORMAL LOW (ref 98–111)
Creatinine, Ser: 5.74 mg/dL — ABNORMAL HIGH (ref 0.61–1.24)
GFR, Estimated: 11 mL/min — ABNORMAL LOW (ref >60)
Glucose, Bld: 114 mg/dL — ABNORMAL HIGH (ref 70–99)
Potassium: 6.3 mmol/L (ref 3.5–5.1)
Sodium: 124 mmol/L — ABNORMAL LOW (ref 135–145)
Total Bilirubin: 1.5 mg/dL — ABNORMAL HIGH (ref 0.3–1.2)
Total Protein: 6.7 g/dL (ref 6.5–8.1)

## 2022-09-14 LAB — MAGNESIUM
Magnesium: 2.3 mg/dL (ref 1.7–2.4)
Magnesium: 2.1 mg/dL (ref 1.7–2.4)

## 2022-09-14 LAB — BLOOD GAS, ARTERIAL
Acid-base deficit: 5 mmol/L — ABNORMAL HIGH (ref 0.0–2.0)
Bicarbonate: 24.6 mmol/L (ref 20.0–28.0)
Delivery systems: POSITIVE
Expiratory PAP: 6 cmH2O
FIO2: 50 %
Inspiratory PAP: 18 cmH2O
O2 Saturation: 98.8 %
Patient temperature: 37
pCO2 arterial: 66 mmHg (ref 32–48)
pH, Arterial: 7.18 — CL (ref 7.35–7.45)
pO2, Arterial: 91 mmHg (ref 83–108)

## 2022-09-14 LAB — CREATININE, SERUM
Creatinine, Ser: 5.16 mg/dL — ABNORMAL HIGH (ref 0.61–1.24)
GFR, Estimated: 12 mL/min — ABNORMAL LOW (ref >60)

## 2022-09-14 LAB — ETHANOL: Alcohol, Ethyl (B): <10 mg/dL (ref <10)

## 2022-09-14 LAB — CBG MONITORING, ED
Glucose-Capillary: 117 mg/dL — ABNORMAL HIGH (ref 70–99)
Glucose-Capillary: 145 mg/dL — ABNORMAL HIGH (ref 70–99)

## 2022-09-14 LAB — LACTIC ACID, PLASMA: Lactic Acid, Venous: 1.3 mmol/L (ref 0.5–1.9)

## 2022-09-14 LAB — HEPATITIS B SURFACE ANTIGEN: Hepatitis B Surface Ag: NONREACTIVE

## 2022-09-14 MED ORDER — SODIUM CHLORIDE 0.9 % IV SOLN: 250.0000 mL | INTRAVENOUS | Status: DC

## 2022-09-14 MED ORDER — DEXTROSE 50 % IV SOLN
1.0000 | Freq: Once | INTRAVENOUS | Status: AC
  Administered 2022-09-14: 50 mL via INTRAVENOUS
  Filled 2022-09-14: qty 50

## 2022-09-14 MED ORDER — SODIUM CHLORIDE 0.9% FLUSH
3.0000 mL | Freq: Two times a day (BID) | INTRAVENOUS | Status: DC
  Administered 2022-09-14 – 2022-09-15 (×4): 3 mL via INTRAVENOUS

## 2022-09-14 MED ORDER — ALLOPURINOL 100 MG PO TABS
200.0000 mg | ORAL_TABLET | Freq: Every day | ORAL | Status: DC
  Administered 2022-09-14: 200 mg via ORAL
  Filled 2022-09-14 (×3): qty 2

## 2022-09-14 MED ORDER — CALCIUM GLUCONATE-NACL 1-0.675 GM/50ML-% IV SOLN
1.0000 g | Freq: Once | INTRAVENOUS | Status: AC
  Administered 2022-09-14: 1000 mg via INTRAVENOUS
  Filled 2022-09-14: qty 50

## 2022-09-14 MED ORDER — ONDANSETRON HCL 4 MG PO TABS: 4.0000 mg | ORAL_TABLET | Freq: Four times a day (QID) | ORAL | Status: DC | PRN

## 2022-09-14 MED ORDER — FLUOXETINE HCL 10 MG PO CAPS
10.0000 mg | ORAL_CAPSULE | Freq: Every day | ORAL | Status: DC
  Administered 2022-09-14: 10 mg via ORAL
  Filled 2022-09-14 (×2): qty 1

## 2022-09-14 MED ORDER — SODIUM POLYSTYRENE SULFONATE 15 GM/60ML PO SUSP
30.0000 g | Freq: Once | ORAL | Status: AC
  Administered 2022-09-15: 30 g via RECTAL
  Filled 2022-09-14: qty 120

## 2022-09-14 MED ORDER — PANTOPRAZOLE SODIUM 40 MG IV SOLR
40.0000 mg | Freq: Two times a day (BID) | INTRAVENOUS | Status: DC
  Administered 2022-09-14 – 2022-09-15 (×4): 40 mg via INTRAVENOUS
  Filled 2022-09-14 (×5): qty 10

## 2022-09-14 MED ORDER — MORPHINE SULFATE (PF) 2 MG/ML IV SOLN: 2.0000 mg | INTRAVENOUS | Status: DC | PRN

## 2022-09-14 MED ORDER — OXYCODONE HCL 5 MG PO TABS
5.0000 mg | ORAL_TABLET | ORAL | Status: DC | PRN
  Administered 2022-09-14: 5 mg via ORAL
  Filled 2022-09-14: qty 1

## 2022-09-14 MED ORDER — FUROSEMIDE 10 MG/ML IJ SOLN
40.0000 mg | Freq: Two times a day (BID) | INTRAMUSCULAR | Status: DC
  Administered 2022-09-14: 40 mg via INTRAVENOUS
  Filled 2022-09-14: qty 4

## 2022-09-14 MED ORDER — INSULIN ASPART 100 UNIT/ML IJ SOLN: 0.0000 [IU] | INTRAMUSCULAR | Status: DC | PRN

## 2022-09-14 MED ORDER — FUROSEMIDE 10 MG/ML IJ SOLN
40.0000 mg | Freq: Once | INTRAMUSCULAR | Status: AC
  Administered 2022-09-14: 40 mg via INTRAVENOUS
  Filled 2022-09-14: qty 4

## 2022-09-14 MED ORDER — CHLORHEXIDINE GLUCONATE CLOTH 2 % EX PADS
6.0000 | MEDICATED_PAD | Freq: Every day | CUTANEOUS | Status: DC
  Administered 2022-09-15: 6 via TOPICAL
  Filled 2022-09-14: qty 6

## 2022-09-14 MED ORDER — INSULIN ASPART 100 UNIT/ML IV SOLN
10.0000 [IU] | Freq: Once | INTRAVENOUS | Status: AC
  Administered 2022-09-14: 10 [IU] via INTRAVENOUS
  Filled 2022-09-14: qty 0.1

## 2022-09-14 MED ORDER — ALBUTEROL SULFATE (2.5 MG/3ML) 0.083% IN NEBU: 2.5000 mg | INHALATION_SOLUTION | RESPIRATORY_TRACT | Status: DC | PRN

## 2022-09-14 MED ORDER — ATORVASTATIN CALCIUM 20 MG PO TABS
20.0000 mg | ORAL_TABLET | Freq: Every day | ORAL | Status: DC
  Administered 2022-09-14: 20 mg via ORAL
  Filled 2022-09-14: qty 1

## 2022-09-14 MED ORDER — CARVEDILOL 25 MG PO TABS
50.0000 mg | ORAL_TABLET | Freq: Two times a day (BID) | ORAL | Status: DC
  Administered 2022-09-14: 50 mg via ORAL
  Filled 2022-09-14: qty 2

## 2022-09-14 MED ORDER — HYDRALAZINE HCL 20 MG/ML IJ SOLN: 5.0000 mg | INTRAMUSCULAR | Status: DC | PRN

## 2022-09-14 MED ORDER — SODIUM BICARBONATE 8.4 % IV SOLN
50.0000 meq | Freq: Once | INTRAVENOUS | Status: AC
  Administered 2022-09-14: 50 meq via INTRAVENOUS
  Filled 2022-09-14: qty 50

## 2022-09-14 MED ORDER — HEPARIN SODIUM (PORCINE) 5000 UNIT/ML IJ SOLN
5000.0000 [IU] | Freq: Three times a day (TID) | INTRAMUSCULAR | Status: DC
  Administered 2022-09-14 – 2022-09-15 (×3): 5000 [IU] via SUBCUTANEOUS
  Filled 2022-09-14 (×3): qty 1

## 2022-09-14 MED ORDER — ONDANSETRON HCL 4 MG/2ML IJ SOLN: 4.0000 mg | Freq: Four times a day (QID) | INTRAMUSCULAR | Status: DC | PRN

## 2022-09-14 MED ORDER — ACETAMINOPHEN 650 MG RE SUPP: 650.0000 mg | Freq: Four times a day (QID) | RECTAL | Status: DC | PRN

## 2022-09-14 MED ORDER — ACETAMINOPHEN 325 MG PO TABS: 650.0000 mg | ORAL_TABLET | Freq: Four times a day (QID) | ORAL | Status: DC | PRN

## 2022-09-14 MED ORDER — PREDNISONE 20 MG PO TABS
40.0000 mg | ORAL_TABLET | Freq: Every day | ORAL | Status: DC
  Administered 2022-09-14: 40 mg via ORAL
  Filled 2022-09-14: qty 2

## 2022-09-14 MED ORDER — NOREPINEPHRINE 4 MG/250ML-% IV SOLN
2.0000 ug/min | INTRAVENOUS | Status: DC
  Administered 2022-09-15: 2 ug/min via INTRAVENOUS
  Filled 2022-09-14: qty 250

## 2022-09-14 MED ORDER — FLUTICASONE PROPIONATE 50 MCG/ACT NA SUSP: 2.0000 | Freq: Every day | NASAL | Status: DC | PRN

## 2022-09-14 NOTE — ED Notes (Signed)
Pt was sedated, placed on bipap by RT.  VBG reflects before he was placed on Bipap

## 2022-09-14 NOTE — ED Notes (Signed)
Pt moved to hospital bed for comfort, he remains on Bipap.  Mental status is not improved.  Pt has had no urine output.  Admitting MD aware and new orders received.

## 2022-09-14 NOTE — Assessment & Plan Note (Signed)
Avoid Contrast study.  Renally dose meds. Nephrology Dr. Cherylann Ratel on board.

## 2022-09-14 NOTE — Progress Notes (Signed)
[facility]  ROUNDING NOTE   Subjective:   [name] is a 57 year old male with past medical conditions including hyperlipidemia, obesity, CHF, hypertension, chronic pain syndrome, and chronic kidney disease stage IIIb.  Patient presents to the emergency department with complaints of shortness of breath.  He has been admitted for Respiratory distress [R06.03]  Patient is known to our practice and is followed by Dr. Thedore Mins.  Patient was last seen in office on 07/27/2022 for routine follow-up.  Patient has been on hemodialysis in the past.  Patient currently seen sitting up on stretcher.  Somnolent, difficult to arouse.  Patient has slurred speech difficult to understand.  Then drifts back off to sleep.  Patient currently on 6 L nasal cannula.  Blood pressure appears soft.  Trace lower extremity edema.  Labs on ED arrival concerning for sodium 122, potassium 5.8, creatinine 4.52, BUN 88, and hemoglobin A1c 7.1.  Baseline creatinine appears to be 2.21 with GFR 34 when last seen in office on 07/27/2022.  Chest x-ray shows vascular congestion.  Patient given one-time dose IV furosemide 40 mg in ED.  No urine output recorded.  We have been consulted to evaluate and manage acute kidney injury.   Objective:  Vital signs in last 24 hours:  Temp:  [98.1 F (36.7 C)-98.9 F (37.2 C)] 98.9 F (37.2 C) (08/19 1200) Pulse Rate:  [71-110] 79 (08/19 1500) Resp:  [15-26] 21 (08/19 1500) BP: (95-141)/(73-105) 105/78 (08/19 1500) SpO2:  [93 %-99 %] 93 % (08/19 1500) FiO2 (%):  [40 %-60 %] 40 % (08/19 1540) Weight:  [158.3 kg] 158.3 kg (08/18 1849)  Weight change:  Filed Weights   09/13/22 1849  Weight: (!) 158.3 kg    Intake/Output: No intake/output data recorded.   Intake/Output this shift:  No intake/output data recorded.  Physical Exam: General: Ill-appearing  Head: Normocephalic, atraumatic.   Eyes: Anicteric  Lungs:  Crackles throughout, diminished basilar  Heart:  Regular rate and rhythm  Abdomen:  Soft, nontender, obese  Extremities: Trace peripheral edema.  Neurologic: Somnolent  Skin: No lesions  Access: None    Basic Metabolic Panel: Recent Labs  Lab 09/12/22 0625 09/13/22 0413 09/13/22 1852 09/14/22 0323  NA 122* 122* 126*  --   K 5.9* 5.8* 5.7*  --   CL 90* 89* 89*  --   CO2 23 22 28   --   GLUCOSE 142* 164* 135*  --   BUN 80* 88* 90*  --   CREATININE 4.01* 4.52* 5.08* 5.16*  CALCIUM 8.6* 8.7* 8.7*  --   MG 2.2  --   --   --     Liver Function Tests: Recent Labs  Lab 09/12/22 0625 09/13/22 2058  AST 54* 35  ALT 42 48*  ALKPHOS 164* 159*  BILITOT 1.3* 1.2  PROT 6.8 7.0  ALBUMIN 3.0* 3.3*   No results for input(s): "LIPASE", "AMYLASE" in the last 168 hours. No results for input(s): "AMMONIA" in the last 168 hours.  CBC: Recent Labs  Lab 09/12/22 0625 09/13/22 0413 09/13/22 1852 09/14/22 0323  WBC 3.1* 3.4* 11.8* 15.3*  NEUTROABS 2.2  --   --   --   HGB 14.1 14.2 14.8 15.3  HCT 44.6 45.9 47.9 49.9  MCV 95.5 95.2 97.2 97.7  PLT 110* 110* 116* 94*    Cardiac Enzymes: No results for input(s): "CKTOTAL", "CKMB", "CKMBINDEX", "TROPONINI" in the last 168 hours.  BNP: Invalid input(s): "POCBNP"  CBG: Recent Labs  Lab 09/14/22 [phone]  09/14/22 1208  GLUCAP 117* 145*    Microbiology: Results for orders placed or performed during the hospital encounter of 09/12/22  SARS Coronavirus 2 by RT PCR (hospital order, performed in [facility] hospital lab) *cepheid single result test* Anterior Nasal Swab     Status: None   Collection Time: 09/12/22  6:25 AM   Specimen: Anterior Nasal Swab  Result Value Ref Range Status   SARS Coronavirus 2 by RT PCR NEGATIVE NEGATIVE Final    Comment: (NOTE) SARS-CoV-2 target nucleic acids are NOT DETECTED.  The SARS-CoV-2 RNA is generally detectable in upper and lower respiratory specimens during the acute phase of infection. The lowest concentration of SARS-CoV-2 viral copies  this assay can detect is 250 copies / mL. A negative result does not preclude SARS-CoV-2 infection and should not be used as the sole basis for treatment or other patient management decisions.  A negative result may occur with improper specimen collection / handling, submission of specimen other than nasopharyngeal swab, presence of viral mutation(s) within the areas targeted by this assay, and inadequate number of viral copies (<250 copies / mL). A negative result must be combined with clinical observations, patient history, and epidemiological information.  Fact Sheet for Patients:   RoadLapTop.co.za  Fact Sheet for Healthcare Providers: http://kim-miller.com/  This test is not yet approved or  cleared by the [place] FDA and has been authorized for detection and/or diagnosis of SARS-CoV-2 by FDA under an Emergency Use Authorization (EUA).  This EUA will remain in effect (meaning this test can be used) for the duration of the COVID-19 declaration under Section 564(b)(1) of the Act, 21 U.S.C. section 360bbb-3(b)(1), unless the authorization is terminated or revoked sooner.  Performed at [facility], [address] [place], [place] [address]     Coagulation Studies: No results for input(s): "LABPROT", "INR" in the last 72 hours.  Urinalysis: No results for input(s): "COLORURINE", "LABSPEC", "PHURINE", "GLUCOSEU", "HGBUR", "BILIRUBINUR", "KETONESUR", "PROTEINUR", "UROBILINOGEN", "NITRITE", "LEUKOCYTESUR" in the last 72 hours.  Invalid input(s): "APPERANCEUR"    Imaging: DG Chest 2 View  Result Date: 09/13/2022 CLINICAL DATA:  Shortness of breath. EXAM: CHEST - 2 VIEW COMPARISON:  09/13/2022 at 0433 hours. FINDINGS: Unchanged cardiomegaly and pulmonary venous congestion. Increased bibasilar airspace opacities. No definite pulmonary edema, pleural effusion or pneumothorax. IMPRESSION: Unchanged cardiomegaly and  pulmonary venous congestion. Increased bibasilar airspace opacities. Electronically Signed   By: Orvan Falconer M.D.   On: 09/13/2022 19:48   DG Chest 2 View  Result Date: 09/13/2022 CLINICAL DATA:  Shortness of breath EXAM: CHEST - 2 VIEW COMPARISON:  Yesterday FINDINGS: Cardiomegaly and vascular pedicle widening with low lung volumes and worsening haziness at the bases. No pneumothorax. IMPRESSION: Low volume chest with worsening aeration at the bases. Unchanged cardiomegaly and vascular congestion. Electronically Signed   By: Tiburcio Pea M.D.   On: 09/13/2022 05:35     Medications:     allopurinol  200 mg Oral Daily   atorvastatin  20 mg Oral Daily   carvedilol  50 mg Oral BID WC   FLUoxetine  10 mg Oral Daily   heparin  5,000 Units Subcutaneous Q8H   pantoprazole (PROTONIX) IV  40 mg Intravenous Q12H   predniSONE  40 mg Oral Daily   sodium chloride flush  3 mL Intravenous Q12H   acetaminophen **OR** acetaminophen, albuterol, fluticasone, hydrALAZINE, insulin aspart, ondansetron **OR** ondansetron (ZOFRAN) IV  Assessment/ Plan:  Mr. [name] is a 57 y.o.  male with  past medical conditions including hyperlipidemia, obesity, CHF, hypertension, chronic pain syndrome, and chronic kidney disease stage IIIb.  Patient presents to the emergency department with complaints of shortness of breath.  He has been admitted for Respiratory distress [R06.03]   Acute kidney injury on chronic kidney disease stage IIIb.  Baseline creatinine appears to be 2.21 with GFR 34 on 07/27/2022. Acute kidney injury likely secondary to volume overload. Patient received IV furosemide in ED with no urine output  Patient has required temporary in the past. Now on bipap. Will consult vascular surgery for placement of HD temp cath. Will initiate dialysis tomorrow.   2. Acute respiratory failure, now requiring bipap. Chest xray showing vascular congestion. IV furosemide given without desired results. Plans to  imitate dialysis in am.   3. Hyponatremia/ hyperkalemia. Sodium 122 and potassium 5.9. Received Lokelma last night. Sodium slightly improved.  Continue to temporize.    LOS: 0   8/19/20244:02 PM

## 2022-09-14 NOTE — Assessment & Plan Note (Signed)
CBC    Component Value Date/Time   WBC 11.8 (H) 09/13/2022 1852   RBC 4.93 09/13/2022 1852   HGB 14.8 09/13/2022 1852   HGB 14.4 01/17/2015 1120   HCT 47.9 09/13/2022 1852   HCT 47.6 01/17/2015 1120   PLT 116 (L) 09/13/2022 1852   PLT 167 01/17/2015 1120   MCV 97.2 09/13/2022 1852   MCV 91 01/17/2015 1120   MCH 30.0 09/13/2022 1852   MCHC 30.9 09/13/2022 1852   RDW 14.5 09/13/2022 1852   RDW 15.7 (H) 01/17/2015 1120   LYMPHSABS 0.5 (L) 09/12/2022 0625   LYMPHSABS 1.6 01/17/2015 1120   MONOABS 0.4 09/12/2022 0625   EOSABS 0.0 09/12/2022 0625   EOSABS 0.1 01/17/2015 1120   BASOSABS 0.0 09/12/2022 0625   BASOSABS 0.0 01/17/2015 1120  Resolved. We will follow.

## 2022-09-14 NOTE — ED Notes (Signed)
IVC/pending medical admission ?

## 2022-09-14 NOTE — Assessment & Plan Note (Addendum)
2/2 PVC on chest xray.  Continue with BiPAP. We will get v/q scan to identify VTE  as pt is at high risk for PE.

## 2022-09-14 NOTE — Progress Notes (Signed)
OT Cancellation Note  Patient Details Name: [name] MRN: [id number] DOB: 04-24-1965   Cancelled Treatment:    Reason Eval/Treat Not Completed: Other (comment). Per secure chat with RN, pt sedated and placed back on Bipap. OT will re-attempt evaluation when pt is able to actively participate.   Jackquline Denmark, MS, OTR/L , CBIS ascom [phone]  09/14/22, 1:09 PM

## 2022-09-14 NOTE — ED Notes (Signed)
Bladder scan showed 58ml in pt bladder.  Dr. Fran Lowes notified

## 2022-09-14 NOTE — Progress Notes (Signed)
PT Cancellation Note  Patient Details Name: [name] MRN: [id number] DOB: [birth year]   Cancelled Treatment:     Pt is currently sedated and therefore not medically cleared for PT eval at this time. Will re-attempt evaluation at a later time or when out of sedation.   [name] 09/14/2022, 12:56 PM

## 2022-09-14 NOTE — ED Notes (Signed)
I&O cath per Dr. Fran Lowes Sterile technique with 2RN.  Pt had concentrated urine.  Dr. Fran Lowes notified.
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
## 2022-09-14 NOTE — ED Notes (Signed)
Requested respiratory to evaluate pt due to low O2 sat and difficulty obtaining an accurate reading. RT advised the nurse should move sensor on pt. RT informed that the sensor had been changed several times. RT advised she would come down to evaluate the pt when she had time.

## 2022-09-14 NOTE — Assessment & Plan Note (Signed)
Respiratory failure with hypoxia and hypercapnia requiring NIPPV. Is tolerating the mask.  And is coming around.  Is answering questions although it is difficult to understand and HPI is otherwise limited secondary to acuity of illness.  Patient is a limited historian.

## 2022-09-14 NOTE — Assessment & Plan Note (Signed)
Patient's last A1c 6 months ago was 6.2 and diabetes is well-controlled. Will cover with glycemic coverage and sliding scale insulin limited regimen. Will obtain an A1c.  Consistent carb diet once patient passes a bedside swallow.

## 2022-09-14 NOTE — ED Notes (Signed)
Pt removing Bipap multiple times, writer attempted 3 times while trying to educate why he is needing the mask. Pt removes it and says he isn't going to wear it right now.

## 2022-09-14 NOTE — Assessment & Plan Note (Signed)
2/2 respiratory failure and hypercapnia. Continue with NIPPV. Continuous pulse oximetry. As needed albuterol.

## 2022-09-14 NOTE — ED Notes (Signed)
Spoke with Dr. Fran Lowes and advised her of critical VBG which was drawn.  Pt continues to be on Bipap (VBG pre bipap on 5L [place]).  Dr. Fran Lowes advises that pt is to remain on Bipap and she will order another VBG this afternoon.

## 2022-09-14 NOTE — Progress Notes (Addendum)
PROGRESS NOTE    [name]  ZOX:[id number] DOB: [birth year] DOA: 09/13/2022 PCP: Darren Rang, PA-C  ED10A/ED10A  LOS: 0 days   Brief hospital course:   Assessment & Plan: [name] is a 57 y.o. male with medical history significant for hypertension, diabetes, morbid obesity, CKD, CHF, and chronic pain syndrome who presented to the ED complaining of shortness of breath.    Pt has had multiple ED presentations since 8/14 for dyspnea asking for just a neb tx, and found to be hypoxic each time.  Each time pt left AMA.  During current presentation, pt also insisted on leaving AMA, however, he appeared confused and didn't seem to understand his current medical condition, therefore he was IVC'ed by ED provider.  EMS sts that pt was at 88% on RA when they picked him up at home.  Pt was placed on 6L O2 with 92% sats.  VBG with pH 7.13, pCO2 90, so pt was placed on BiPAP and admitted to PCU.  Acute respiratory failure with hypoxia and hypercapnia (HCC) Respiratory failure with hypoxia and hypercapnia requiring NIPPV.  BNP 3559, CXR showed cardiomegaly and vascular congestion.  Pt stated to ED provider that his legs have been increasingly swollen and he has only urinated a couple of times in the past 24 hours PTA.  Pt likely has fluid overload from CHF or kidney failure or both. --Pt removed BiPAP this morning.  Was found to be somnolent.  Repeat VBG showed pH 7.11 and pCO2 86, similar to prior.   Plan: --need to continue BiPAP --repeat VBG at 5 pm.  Fluid overload --last Echo in Oct 2023 showed normal LVEF systolic and diastolic fxn. --BNP 3559, CXR showed cardiomegaly and vascular congestion. --started on IV lasix 80, with no significant urine output Plan: --cont IV lasix 80 for now --if no urine output, may need dialysis --Echo  AKI on CKD4 Cr 4.52 on presentation.  Was 2.51 back in April 2024.  It appears that pt had been on dialysis in the past. --pt reported to ED  provider that he had significant reduce in urine output. --nephro consult.  May need dialysis.  Hyperkalemia --due to kidney failure.  Received Lokelma in the ED  Acute metabolic encephalopathy Pt became somnolent after presentation.  2/2 hypoxic and hypercapnia, also uremia. --tx as above  Type 2 diabetes mellitus with stage 4 chronic kidney disease, without long-term current use of insulin (HCC) --A1c 7.1 --d/c BG checks, no need.  Anemia, ruled out   DVT prophylaxis: Heparin SQ Code Status: Full code  Family Communication:  Level of care: Progressive Dispo:   The patient is from: home Anticipated d/c is to: home Anticipated d/c date is: > 3 days   Subjective and Interval History:  Pt was off BiPAP during rounds.  Difficult to awake.  Denied cough.    VBG returned not improved.  Pt was placed back on BiPAP.   Objective: Vitals:   09/14/22 1200 09/14/22 1213 09/14/22 1300 09/14/22 1400  BP: 115/77 115/77 98/73 99/74   Pulse:   77   Resp: 19  18 (!) 22  Temp: 98.9 F (37.2 C)     TempSrc: Oral     SpO2: 97%  95% 96%  Weight:      Height:       No intake or output data in the 24 hours ending 09/14/22 1532 Filed Weights   09/13/22 1849  Weight: (!) 158.3 kg    Examination:   Constitutional:  NAD, somnolent CV: No cyanosis.   RESP: normal respiratory effort, on 6L Extremities: edema in BLE SKIN: warm, dry   Data Reviewed: I have personally reviewed labs and imaging studies  Time spent: 50 minutes  Darren Priestly, MD [facility] If 7PM-7AM, please contact night-coverage 09/14/2022, 3:32 PM

## 2022-09-14 NOTE — Progress Notes (Signed)
       CROSS COVER NOTE  NAME: [name] MRN: [id number] DOB : 08/21/1965    Concern as stated by nurse / staff   just wanted to keep you advised of this pt. pt has been soft since I took over at 1900. most recent is 86/64 but I have have a MAP as low as 57.   Per Dr Fran Lowes: Dr. Para March, I told Nephro pt is not making much urine, Dr. Cherylann Ratel said pt will get dialysis tomorrow. Probably should avoid IVF if BP low. He is going to step down, so can start low dose pressor.     Pertinent findings on chart review: Progress note reviewed.  Patient with CKD 4 admitted with volume overload and respiratory failure on BiPAP.  VBG with respiratory acidosis, unchanged with BiPAP, pH 7.11 and pCO2 86 BP continues to be low:    09/14/2022    8:00 PM 09/14/2022    7:00 PM 09/14/2022    6:00 PM  Vitals with BMI  Systolic 75 90 105  Diastolic 50 65 73  Pulse  77 75   ABG with severe respiratory acidosis:   12:20pm:  pH 7.11, pCO2 86 17:49:        pH7.16, pCO2 74      Latest Ref Rng & Units 09/14/2022    5:49 PM 09/14/2022    3:23 AM 09/13/2022    6:52 PM  BMP  Glucose 70 - 99 mg/dL 409   811   BUN 6 - 20 mg/dL 93   90   Creatinine 9.14 - 1.24 mg/dL 7.82  9.56  2.13   Sodium 135 - 145 mmol/L 125   126   Potassium 3.5 - 5.1 mmol/L 6.1   5.7   Chloride 98 - 111 mmol/L 90   89   CO2 22 - 32 mmol/L 25   28   Calcium 8.9 - 10.3 mg/dL 8.8   8.7       Assessment and  Interventions   Assessment:  Hypotension Fluid overload and need for dialysis Respiratory failure with severe respiratory acidosis despite BiPAP  Plan: ICU consulted.  Spoke with ICU NP, Cheryll Cockayne who will see patient and assess for need for intubation and pressors X X

## 2022-09-14 NOTE — ED Notes (Signed)
Pt mother came to visit.  Pt remains on Bipap and continues to be somnolent

## 2022-09-14 NOTE — Assessment & Plan Note (Addendum)
Continue patient on his  Coreg and Lipitor. Continue patient on Lasix. Strict I/O

## 2022-09-15 ENCOUNTER — Inpatient Hospital Stay: Payer: PPO

## 2022-09-15 ENCOUNTER — Inpatient Hospital Stay
Admit: 2022-09-15 | Discharge: 2022-09-15 | Disposition: A | Discharge status: Home / Self Care | Payer: PPO | Attending: Hospitalist | Admitting: Hospitalist

## 2022-09-15 ENCOUNTER — Inpatient Hospital Stay (HOSPITAL_COMMUNITY)
Admit: 2022-09-15 | Discharge: 2022-09-15 | Disposition: A | Discharge status: Home / Self Care | Payer: PPO | Attending: Hospitalist | Admitting: Hospitalist

## 2022-09-15 DIAGNOSIS — N186 End stage renal disease: Secondary | ICD-10-CM | POA: Diagnosis not present

## 2022-09-15 DIAGNOSIS — N179 Acute kidney failure, unspecified: Secondary | ICD-10-CM | POA: Diagnosis not present

## 2022-09-15 DIAGNOSIS — Z515 Encounter for palliative care: Secondary | ICD-10-CM

## 2022-09-15 DIAGNOSIS — J9601 Acute respiratory failure with hypoxia: Secondary | ICD-10-CM | POA: Diagnosis not present

## 2022-09-15 DIAGNOSIS — E875 Hyperkalemia: Secondary | ICD-10-CM

## 2022-09-15 DIAGNOSIS — R0603 Acute respiratory distress: Secondary | ICD-10-CM

## 2022-09-15 DIAGNOSIS — J810 Acute pulmonary edema: Secondary | ICD-10-CM | POA: Diagnosis not present

## 2022-09-15 LAB — ECHOCARDIOGRAM COMPLETE
AR max vel: 1.86 cm2
AV Area VTI: 1.88 cm2
AV Area mean vel: 1.66 cm2
AV Mean grad: 2 mmHg
AV Peak grad: 3 mmHg
Ao pk vel: 0.87 m/s
Area-P 1/2: 6.9 cm2
Height: 71 in
S' Lateral: 2.7 cm
Weight: 6241.6 [oz_av]

## 2022-09-15 LAB — BLOOD GAS, ARTERIAL
Acid-base deficit: 5.6 mmol/L — ABNORMAL HIGH (ref 0.0–2.0)
Acid-base deficit: 1.7 mmol/L (ref 0.0–2.0)
Acid-base deficit: 4 mmol/L — ABNORMAL HIGH (ref 0.0–2.0)
Bicarbonate: 23.9 mmol/L (ref 20.0–28.0)
Bicarbonate: 28 mmol/L (ref 20.0–28.0)
Bicarbonate: 22.6 mmol/L (ref 20.0–28.0)
Delivery systems: POSITIVE
Expiratory PAP: 6 cmH2O
FIO2: 50 %
FIO2: 1 %
FIO2: 100 %
Inspiratory PAP: 18 cmH2O
MECHVT: 500 mL
MECHVT: 600 mL
Mechanical Rate: 16
O2 Saturation: 98.1 %
O2 Saturation: 93.8 %
O2 Saturation: 99.9 %
PEEP: 12 cmH2O
PEEP: 15 cmH2O
Patient temperature: 37
Patient temperature: 37
Patient temperature: 37
RATE: 20 resp/min
pCO2 arterial: 64 mmHg — ABNORMAL HIGH (ref 32–48)
pCO2 arterial: 70 mmHg (ref 32–48)
pCO2 arterial: 46 mmHg (ref 32–48)
pH, Arterial: 7.18 — CL (ref 7.35–7.45)
pH, Arterial: 7.21 — ABNORMAL LOW (ref 7.35–7.45)
pH, Arterial: 7.3 — ABNORMAL LOW (ref 7.35–7.45)
pO2, Arterial: 86 mmHg (ref 83–108)
pO2, Arterial: 63 mmHg — ABNORMAL LOW (ref 83–108)
pO2, Arterial: 103 mmHg (ref 83–108)

## 2022-09-15 LAB — CBC
HCT: 45.9 % (ref 39.0–52.0)
Hemoglobin: 13.9 g/dL (ref 13.0–17.0)
MCH: 29.6 pg (ref 26.0–34.0)
MCHC: 30.3 g/dL (ref 30.0–36.0)
MCV: 97.7 fL (ref 80.0–100.0)
Platelets: 91 10*3/uL — ABNORMAL LOW (ref 150–400)
RBC: 4.7 MIL/uL (ref 4.22–5.81)
RDW: 14.7 % (ref 11.5–15.5)
WBC: 16.3 10*3/uL — ABNORMAL HIGH (ref 4.0–10.5)
nRBC: 2.5 % — ABNORMAL HIGH (ref 0.0–0.2)

## 2022-09-15 LAB — RENAL FUNCTION PANEL
Albumin: 2.6 g/dL — ABNORMAL LOW (ref 3.5–5.0)
Anion gap: 14 (ref 5–15)
BUN: 85 mg/dL — ABNORMAL HIGH (ref 6–20)
CO2: 23 mmol/L (ref 22–32)
Calcium: 8.6 mg/dL — ABNORMAL LOW (ref 8.9–10.3)
Chloride: 90 mmol/L — ABNORMAL LOW (ref 98–111)
Creatinine, Ser: 4.35 mg/dL — ABNORMAL HIGH (ref 0.61–1.24)
GFR, Estimated: 15 mL/min — ABNORMAL LOW (ref >60)
Glucose, Bld: 139 mg/dL — ABNORMAL HIGH (ref 70–99)
Phosphorus: 4.7 mg/dL — ABNORMAL HIGH (ref 2.5–4.6)
Potassium: 4.6 mmol/L (ref 3.5–5.1)
Sodium: 127 mmol/L — ABNORMAL LOW (ref 135–145)

## 2022-09-15 LAB — BASIC METABOLIC PANEL
Anion gap: 9 (ref 5–15)
BUN: 90 mg/dL — ABNORMAL HIGH (ref 6–20)
CO2: 22 mmol/L (ref 22–32)
Calcium: 10 mg/dL (ref 8.9–10.3)
Chloride: 96 mmol/L — ABNORMAL LOW (ref 98–111)
Creatinine, Ser: 5.35 mg/dL — ABNORMAL HIGH (ref 0.61–1.24)
GFR, Estimated: 12 mL/min — ABNORMAL LOW (ref >60)
Glucose, Bld: 86 mg/dL (ref 70–99)
Potassium: 5.1 mmol/L (ref 3.5–5.1)
Sodium: 127 mmol/L — ABNORMAL LOW (ref 135–145)

## 2022-09-15 LAB — PHOSPHORUS: Phosphorus: 5.6 mg/dL — ABNORMAL HIGH (ref 2.5–4.6)

## 2022-09-15 LAB — BLOOD GAS, VENOUS
Acid-base deficit: 4.6 mmol/L — ABNORMAL HIGH (ref 0.0–2.0)
Bicarbonate: 26.2 mmol/L (ref 20.0–28.0)
O2 Saturation: 72.1 %
Patient temperature: 37
pCO2, Ven: 77 mmHg (ref 44–60)
pH, Ven: 7.14 — CL (ref 7.25–7.43)
pO2, Ven: 40 mmHg (ref 32–45)

## 2022-09-15 LAB — GLUCOSE, CAPILLARY
Glucose-Capillary: 91 mg/dL (ref 70–99)
Glucose-Capillary: 75 mg/dL (ref 70–99)
Glucose-Capillary: 89 mg/dL (ref 70–99)
Glucose-Capillary: 81 mg/dL (ref 70–99)
Glucose-Capillary: 148 mg/dL — ABNORMAL HIGH (ref 70–99)

## 2022-09-15 LAB — HEPATIC FUNCTION PANEL
ALT: 34 U/L (ref 0–44)
AST: 21 U/L (ref 15–41)
Albumin: 2.5 g/dL — ABNORMAL LOW (ref 3.5–5.0)
Alkaline Phosphatase: 116 U/L (ref 38–126)
Bilirubin, Direct: 0.7 mg/dL — ABNORMAL HIGH (ref 0.0–0.2)
Indirect Bilirubin: 0.4 mg/dL (ref 0.3–0.9)
Total Bilirubin: 1.1 mg/dL (ref 0.3–1.2)
Total Protein: 5.6 g/dL — ABNORMAL LOW (ref 6.5–8.1)

## 2022-09-15 LAB — TROPONIN I (HIGH SENSITIVITY)
Troponin I (High Sensitivity): 33 ng/L — ABNORMAL HIGH (ref <18)
Troponin I (High Sensitivity): 33 ng/L — ABNORMAL HIGH (ref <18)

## 2022-09-15 LAB — COOXEMETRY PANEL
Carboxyhemoglobin: 1.8 % — ABNORMAL HIGH (ref 0.5–1.5)
Methemoglobin: 1.2 % (ref 0.0–1.5)
O2 Saturation: 97.8 %
Total hemoglobin: 14.6 g/dL (ref 12.0–16.0)
Total oxygen content: 95 %

## 2022-09-15 LAB — PROCALCITONIN: Procalcitonin: 21.38 ng/mL

## 2022-09-15 LAB — LACTIC ACID, PLASMA
Lactic Acid, Venous: 1.5 mmol/L (ref 0.5–1.9)
Lactic Acid, Venous: 2 mmol/L (ref 0.5–1.9)

## 2022-09-15 LAB — MAGNESIUM: Magnesium: 2.1 mg/dL (ref 1.7–2.4)

## 2022-09-15 LAB — MRSA NEXT GEN BY PCR, NASAL: MRSA by PCR Next Gen: NOT DETECTED

## 2022-09-15 LAB — HEPARIN LEVEL (UNFRACTIONATED): Heparin Unfractionated: 0.14 [IU]/mL — ABNORMAL LOW (ref 0.30–0.70)

## 2022-09-15 MED ORDER — CISATRACURIUM BOLUS VIA INFUSION
0.0500 mg/kg | Freq: Once | INTRAVENOUS | Status: AC
  Administered 2022-09-15: 8.8 mg via INTRAVENOUS
  Filled 2022-09-15: qty 9

## 2022-09-15 MED ORDER — INSULIN ASPART 100 UNIT/ML IJ SOLN
0.0000 [IU] | INTRAMUSCULAR | Status: DC
  Administered 2022-09-15 (×2): 1 [IU] via SUBCUTANEOUS
  Filled 2022-09-15 (×2): qty 1

## 2022-09-15 MED ORDER — SODIUM CHLORIDE 0.9 % IV SOLN
0.0000 ug/kg/min | INTRAVENOUS | Status: DC
  Administered 2022-09-15: 3 ug/kg/min via INTRAVENOUS
  Filled 2022-09-15: qty 20

## 2022-09-15 MED ORDER — ORAL CARE MOUTH RINSE
15.0000 mL | OROMUCOSAL | Status: DC
  Administered 2022-09-15 – 2022-09-16 (×2): 15 mL via OROMUCOSAL

## 2022-09-15 MED ORDER — ROCURONIUM BROMIDE 10 MG/ML (PF) SYRINGE: 50.0000 mg | PREFILLED_SYRINGE | Freq: Once | INTRAVENOUS | Status: AC

## 2022-09-15 MED ORDER — PRISMASOL BGK 2/3.5 32-2-3.5 MEQ/L EC SOLN
Status: DC
  Filled 2022-09-15 (×3): qty 5000

## 2022-09-15 MED ORDER — IPRATROPIUM-ALBUTEROL 0.5-2.5 (3) MG/3ML IN SOLN
3.0000 mL | RESPIRATORY_TRACT | Status: DC
  Administered 2022-09-15 (×6): 3 mL via RESPIRATORY_TRACT
  Filled 2022-09-15 (×5): qty 3

## 2022-09-15 MED ORDER — ARTIFICIAL TEARS OPHTHALMIC OINT
1.0000 | TOPICAL_OINTMENT | Freq: Three times a day (TID) | OPHTHALMIC | Status: DC
  Administered 2022-09-15: 1 via OPHTHALMIC
  Filled 2022-09-15: qty 1

## 2022-09-15 MED ORDER — STERILE WATER FOR INJECTION IV SOLN
Freq: Once | INTRAVENOUS | Status: AC
  Filled 2022-09-15: qty 150

## 2022-09-15 MED ORDER — LINEZOLID 600 MG/300ML IV SOLN
600.0000 mg | Freq: Two times a day (BID) | INTRAVENOUS | Status: DC
  Filled 2022-09-15: qty 300

## 2022-09-15 MED ORDER — CALCIUM CHLORIDE 10 % IV SOLN
1.0000 g | Freq: Once | INTRAVENOUS | Status: AC
  Administered 2022-09-15: 1 g via INTRAVENOUS
  Filled 2022-09-15: qty 10

## 2022-09-15 MED ORDER — PRISMASOL BGK 2/3.5 32-2-3.5 MEQ/L EC SOLN
Status: DC
  Filled 2022-09-15 (×18): qty 5000

## 2022-09-15 MED ORDER — SODIUM CHLORIDE 0.9 % IV BOLUS
250.0000 mL | Freq: Once | INTRAVENOUS | Status: AC
  Administered 2022-09-15: 250 mL via INTRAVENOUS

## 2022-09-15 MED ORDER — FENTANYL 2500MCG IN NS 250ML (10MCG/ML) PREMIX INFUSION: 50.0000 ug/h | INTRAVENOUS | Status: DC

## 2022-09-15 MED ORDER — CALCIUM GLUCONATE 10 % IV SOLN
1.0000 g | Freq: Once | INTRAVENOUS | Status: DC
  Filled 2022-09-15: qty 10

## 2022-09-15 MED ORDER — FENTANYL BOLUS VIA INFUSION
50.0000 ug | INTRAVENOUS | Status: DC | PRN
  Administered 2022-09-15: 100 ug via INTRAVENOUS

## 2022-09-15 MED ORDER — MIDAZOLAM HCL 2 MG/2ML IJ SOLN: 2.0000 mg | Freq: Once | INTRAMUSCULAR | Status: AC

## 2022-09-15 MED ORDER — FENTANYL CITRATE PF 50 MCG/ML IJ SOSY
PREFILLED_SYRINGE | INTRAMUSCULAR | Status: AC
  Administered 2022-09-15: 25 ug via INTRAVENOUS
  Filled 2022-09-15: qty 1

## 2022-09-15 MED ORDER — SODIUM BICARBONATE 8.4 % IV SOLN
100.0000 meq | Freq: Once | INTRAVENOUS | Status: DC
Start: 2022-09-15 — End: 2022-09-15

## 2022-09-15 MED ORDER — HEPARIN BOLUS VIA INFUSION
7000.0000 [IU] | Freq: Once | INTRAVENOUS | Status: AC
  Administered 2022-09-15: 7000 [IU] via INTRAVENOUS
  Filled 2022-09-15: qty 7000

## 2022-09-15 MED ORDER — IPRATROPIUM-ALBUTEROL 0.5-2.5 (3) MG/3ML IN SOLN
RESPIRATORY_TRACT | Status: AC
  Filled 2022-09-15: qty 3

## 2022-09-15 MED ORDER — HYDROCORTISONE SOD SUC (PF) 100 MG IJ SOLR
100.0000 mg | Freq: Two times a day (BID) | INTRAMUSCULAR | Status: DC
  Administered 2022-09-15 (×2): 100 mg via INTRAVENOUS
  Filled 2022-09-15 (×2): qty 2

## 2022-09-15 MED ORDER — POLYETHYLENE GLYCOL 3350 17 G PO PACK: 17.0000 g | PACK | Freq: Every day | ORAL | Status: DC

## 2022-09-15 MED ORDER — SODIUM BICARBONATE 8.4 % IV SOLN
INTRAVENOUS | Status: AC
  Administered 2022-09-15: 50 meq
  Filled 2022-09-15: qty 50

## 2022-09-15 MED ORDER — ARTIFICIAL TEARS OPHTHALMIC OINT
1.0000 | TOPICAL_OINTMENT | Freq: Three times a day (TID) | OPHTHALMIC | Status: DC
  Administered 2022-09-15: 1 via OPHTHALMIC
  Filled 2022-09-15: qty 3.5

## 2022-09-15 MED ORDER — ORAL CARE MOUTH RINSE: 15.0000 mL | OROMUCOSAL | Status: DC | PRN

## 2022-09-15 MED ORDER — HEPARIN (PORCINE) 25000 UT/250ML-% IV SOLN
2300.0000 [IU]/h | INTRAVENOUS | Status: DC
  Administered 2022-09-15 (×2): 2000 [IU]/h via INTRAVENOUS
  Filled 2022-09-15 (×2): qty 250

## 2022-09-15 MED ORDER — NOREPINEPHRINE 16 MG/250ML-% IV SOLN
0.0000 ug/min | INTRAVENOUS | Status: DC
  Administered 2022-09-15: 100 ug/min via INTRAVENOUS
  Administered 2022-09-15: 25 ug/min via INTRAVENOUS
  Administered 2022-09-15 (×2): 150 ug/min via INTRAVENOUS
  Administered 2022-09-15: 50 ug/min via INTRAVENOUS
  Filled 2022-09-15 (×7): qty 250

## 2022-09-15 MED ORDER — PIPERACILLIN-TAZOBACTAM 3.375 G IVPB
3.3750 g | Freq: Four times a day (QID) | INTRAVENOUS | Status: DC
  Administered 2022-09-15 (×3): 3.375 g via INTRAVENOUS
  Filled 2022-09-15 (×3): qty 50

## 2022-09-15 MED ORDER — SODIUM BICARBONATE 8.4 % IV SOLN
50.0000 meq | Freq: Once | INTRAVENOUS | Status: AC
  Administered 2022-09-15: 50 meq via INTRAVENOUS

## 2022-09-15 MED ORDER — FAMOTIDINE 20 MG PO TABS
10.0000 mg | ORAL_TABLET | Freq: Two times a day (BID) | ORAL | Status: DC
  Administered 2022-09-15: 10 mg

## 2022-09-15 MED ORDER — VECURONIUM BROMIDE 10 MG IV SOLR
10.0000 mg | INTRAVENOUS | Status: DC | PRN
  Administered 2022-09-15: 10 mg via INTRAVENOUS
  Filled 2022-09-15: qty 10

## 2022-09-15 MED ORDER — FENTANYL CITRATE PF 50 MCG/ML IJ SOSY: 25.0000 ug | PREFILLED_SYRINGE | Freq: Once | INTRAMUSCULAR | Status: AC

## 2022-09-15 MED ORDER — ETOMIDATE 2 MG/ML IV SOLN
INTRAVENOUS | Status: AC
  Filled 2022-09-15: qty 20

## 2022-09-15 MED ORDER — ETOMIDATE 2 MG/ML IV SOLN: 20.0000 mg | Freq: Once | INTRAVENOUS | Status: DC

## 2022-09-15 MED ORDER — FENTANYL CITRATE PF 50 MCG/ML IJ SOSY: 50.0000 ug | PREFILLED_SYRINGE | Freq: Once | INTRAMUSCULAR | Status: AC

## 2022-09-15 MED ORDER — SODIUM CHLORIDE 0.9 % IV BOLUS
750.0000 mL | Freq: Once | INTRAVENOUS | Status: AC
  Administered 2022-09-15: 750 mL via INTRAVENOUS

## 2022-09-15 MED ORDER — MIDAZOLAM BOLUS VIA INFUSION: 1.0000 mg | INTRAVENOUS | Status: DC | PRN

## 2022-09-15 MED ORDER — SODIUM CHLORIDE 0.9 % IV SOLN
1.0000 g | Freq: Once | INTRAVENOUS | Status: DC
  Filled 2022-09-15: qty 10

## 2022-09-15 MED ORDER — VANCOMYCIN HCL 2000 MG/400ML IV SOLN
2000.0000 mg | Freq: Once | INTRAVENOUS | Status: AC
  Administered 2022-09-15: 2000 mg via INTRAVENOUS
  Filled 2022-09-15: qty 400

## 2022-09-15 MED ORDER — MIDAZOLAM-SODIUM CHLORIDE 100-0.9 MG/100ML-% IV SOLN
0.0000 mg/h | INTRAVENOUS | Status: DC
  Administered 2022-09-15: 2 mg/h via INTRAVENOUS
  Filled 2022-09-15: qty 100

## 2022-09-15 MED ORDER — FENTANYL BOLUS VIA INFUSION: 50.0000 ug | INTRAVENOUS | Status: DC | PRN

## 2022-09-15 MED ORDER — STERILE WATER FOR INJECTION IV SOLN
INTRAVENOUS | Status: DC
  Filled 2022-09-15 (×2): qty 1000
  Filled 2022-09-15: qty 150

## 2022-09-15 MED ORDER — BUDESONIDE 0.5 MG/2ML IN SUSP
0.5000 mg | Freq: Two times a day (BID) | RESPIRATORY_TRACT | Status: DC
  Administered 2022-09-15: 0.5 mg via RESPIRATORY_TRACT
  Filled 2022-09-15: qty 2

## 2022-09-15 MED ORDER — MIDAZOLAM HCL 2 MG/2ML IJ SOLN
INTRAMUSCULAR | Status: AC
  Filled 2022-09-15: qty 2

## 2022-09-15 MED ORDER — SODIUM CHLORIDE 0.9 % IV SOLN
500.0000 mg | INTRAVENOUS | Status: DC
  Administered 2022-09-15: 500 mg via INTRAVENOUS
  Filled 2022-09-15 (×2): qty 5

## 2022-09-15 MED ORDER — MIDAZOLAM BOLUS VIA INFUSION
0.0000 mg | INTRAVENOUS | Status: DC | PRN
  Administered 2022-09-15 (×2): 1 mg via INTRAVENOUS

## 2022-09-15 MED ORDER — SODIUM CHLORIDE 0.9 % IV SOLN
0.5000 ug/min | INTRAVENOUS | Status: DC
  Administered 2022-09-15: 10 ug/min via INTRAVENOUS
  Administered 2022-09-15: 0.5 ug/min via INTRAVENOUS
  Administered 2022-09-15: 20 ug/min via INTRAVENOUS
  Filled 2022-09-15 (×3): qty 10

## 2022-09-15 MED ORDER — FENTANYL 2500MCG IN NS 250ML (10MCG/ML) PREMIX INFUSION
50.0000 ug/h | INTRAVENOUS | Status: DC
  Administered 2022-09-15: 50 ug/h via INTRAVENOUS
  Filled 2022-09-15: qty 250

## 2022-09-15 MED ORDER — VASOPRESSIN 20 UNITS/100 ML INFUSION FOR SHOCK
0.0000 [IU]/min | INTRAVENOUS | Status: DC
  Administered 2022-09-15 (×2): 0.04 [IU]/min via INTRAVENOUS
  Administered 2022-09-15: 0.03 [IU]/min via INTRAVENOUS
  Administered 2022-09-15: 0.04 [IU]/min via INTRAVENOUS
  Filled 2022-09-15 (×4): qty 100

## 2022-09-15 MED ORDER — METHYLPREDNISOLONE SODIUM SUCC 40 MG IJ SOLR
40.0000 mg | Freq: Two times a day (BID) | INTRAMUSCULAR | Status: DC
  Administered 2022-09-15: 40 mg via INTRAVENOUS
  Filled 2022-09-15: qty 1

## 2022-09-15 MED ORDER — MIDAZOLAM HCL 2 MG/2ML IJ SOLN: 1.0000 mg | INTRAMUSCULAR | Status: DC | PRN

## 2022-09-15 MED ORDER — ROCURONIUM BROMIDE 10 MG/ML (PF) SYRINGE
PREFILLED_SYRINGE | INTRAVENOUS | Status: AC
  Administered 2022-09-15: 50 mg via INTRAVENOUS
  Filled 2022-09-15: qty 10

## 2022-09-15 MED ORDER — PHENYLEPHRINE HCL-NACL 20-0.9 MG/250ML-% IV SOLN
0.0000 ug/min | INTRAVENOUS | Status: DC
  Administered 2022-09-15 – 2022-09-16 (×2): 20 ug/min via INTRAVENOUS
  Filled 2022-09-15 (×2): qty 250

## 2022-09-15 MED ORDER — EPINEPHRINE 1 MG/10ML IJ SOSY
1.0000 mg | PREFILLED_SYRINGE | Freq: Once | INTRAMUSCULAR | Status: AC
  Administered 2022-09-15: 1 mg via INTRAVENOUS

## 2022-09-15 MED ORDER — MIDAZOLAM HCL 2 MG/2ML IJ SOLN
INTRAMUSCULAR | Status: AC
  Administered 2022-09-15: 2 mg via INTRAVENOUS
  Filled 2022-09-15: qty 2

## 2022-09-15 MED ORDER — FENTANYL CITRATE PF 50 MCG/ML IJ SOSY
PREFILLED_SYRINGE | INTRAMUSCULAR | Status: AC
  Administered 2022-09-15: 50 ug via INTRAVENOUS
  Filled 2022-09-15: qty 2

## 2022-09-15 MED ORDER — HEPARIN SODIUM (PORCINE) 1000 UNIT/ML DIALYSIS
1000.0000 [IU] | INTRAMUSCULAR | Status: DC | PRN
  Administered 2022-09-15 (×2): 2800 [IU] via INTRAVENOUS_CENTRAL
  Filled 2022-09-15: qty 3
  Filled 2022-09-15: qty 6
  Filled 2022-09-15: qty 3
  Filled 2022-09-15: qty 6

## 2022-09-15 MED ORDER — BUDESONIDE 0.25 MG/2ML IN SUSP
0.2500 mg | Freq: Two times a day (BID) | RESPIRATORY_TRACT | Status: DC
  Administered 2022-09-15: 0.25 mg via RESPIRATORY_TRACT
  Filled 2022-09-15: qty 2

## 2022-09-15 MED ORDER — DOCUSATE SODIUM 50 MG/5ML PO LIQD
100.0000 mg | Freq: Two times a day (BID) | ORAL | Status: DC
  Administered 2022-09-15: 100 mg
  Filled 2022-09-15: qty 10

## 2022-09-15 MED ORDER — FAMOTIDINE 20 MG PO TABS
20.0000 mg | ORAL_TABLET | Freq: Two times a day (BID) | ORAL | Status: DC
  Filled 2022-09-15: qty 1

## 2022-09-15 MED ORDER — MIDAZOLAM-SODIUM CHLORIDE 100-0.9 MG/100ML-% IV SOLN
2.0000 mg/h | INTRAVENOUS | Status: DC
  Administered 2022-09-15: 4 mg/h via INTRAVENOUS

## 2022-09-15 NOTE — TOC CM/SW Note (Signed)
Cm unable to complete TOC assessment and admission prevention assessment due to pt being intubated. Will continue to follow for toc needs.

## 2022-09-15 NOTE — Progress Notes (Signed)
PEEP increased to 12 by NP due to low saturation

## 2022-09-15 NOTE — Progress Notes (Signed)
Refractory Acidosis suspect multifocal in the setting of AKI on CKD and possible COPD exacerbation Patient has hit a plateau with BIPAP support, he continue to have refractory acidosis. However, bedside the patient is responsive with a RASS -1, able to follow simple commands, but still confused. Discussed with E-link intensivist & nephrology, who are in agreement with plan below. - will attempt to place temporary vas cath and initiate urgent CRRT. Updated mother, Carolan Clines, who consented for the procedure. - Initiate CRRT - follow up ABG an hour post, monitor acidosis > patient still at a high risk for intubation - prednisone changed to solu medrol, scheduled duo nebs added    Betsey Holiday, AGACNP-BC Acute Care Nurse Practitioner [facility]   [phone] / [phone] Please see Amion for details.

## 2022-09-15 NOTE — Code Documentation (Signed)
BRIEF CODE BLUE REPORT  RN reported that pt coughing and asynchronous with vent, then he abruptly decompensated again became severely hypotensive (SBP 30's) and severely hypoxic (O2 sats in single digits), with dampened waveform on A-line, which then progressed to PEA cardiac arrest at 14:38.  Dr. Belia Heman, myself, charge RN, and multiple staff RN's at bedside.   Code blue called, CPR and ACLS begun immediately.  He received 2 amps of Epi, 2 amps of bicarb, and 1 g Calcium with return of ROSC. During the Code, Epinephrine drip was resumed at 20 mcg/hr.   With ROSC, SBP noted to be around 200, and O2 sats improved  to mid 90's.    Discussed with Dr. Belia Heman, will start Bicarb gtt until CRRT able to run consistently (filter clotted requiring new setup), start Nimbex gtt to prevent any vent dyschrony/coughing, and will start empiric Heparin gtt for presumed PE.  Pt is critically ill with multiorgan failure now s/p cardiac arrest.  Pt's prognosis is EXTREMELY POOR, concern that he may exhibit evidence anoxic brain injury, and unlikely that he will survive the next 24 hrs. He is suffering and in the dying process.  Dr. Belia Heman spoke with pt's mother via telephone to update her on current events, urging her to come to bedside.  Recommend DNR status.     [name], AGACNP-BC [facility] Prefer epic messenger for cross cover needs If after hours, please call E-link

## 2022-09-15 NOTE — Progress Notes (Signed)
Pt placed on AVAPS per NP Cheryll [facility]. Vt 600, Rate 10, EPAP 7, Min P 16, Max P 25, FIO2 50%

## 2022-09-15 NOTE — Progress Notes (Signed)
Initial Nutrition Assessment  DOCUMENTATION CODES:   Morbid obesity  INTERVENTION:   Once appropriate for tube feeds, recommend:  Pivot 1.5@50ml /hr- Initiate at 9ml/hr, once tolerating, increase by 84ml/hr q 8 hours until goal rate is reached.   ProSource TF 20- Give 60ml QID via tube, each supplement provides 80kcal and 20g of protein.   Free water flushes 30ml q4 hours to maintain tube patency   Regimen provides 2120kcal/day, 193g/day protein and 1062ml/day of free water.   Pt at high refeed risk; recommend monitor potassium, magnesium and phosphorus labs daily until stable  Daily weights   NUTRITION DIAGNOSIS:   Inadequate oral intake related to inability to eat (pt sedated and ventilated) as evidenced by NPO status.  GOAL:   Provide needs based on ASPEN/SCCM guidelines  MONITOR:   Vent status, Labs, Weight trends, I & O's, Skin  REASON FOR ASSESSMENT:   Consult Assessment of nutrition requirement/status  ASSESSMENT:   57 year old male with past medical conditions including hyperlipidemia, morbid obesity, CHF, hypertension, chronic pain syndrome, chronic kidney disease stage IIIb, DM, gout and etoh abuse who is admitted with PNA, sepsis, possible cellulitis and AKI.  Pt ventilated this morning and near cardiac arrest. OGT in place but pt is not stable to start tube feeds today. CRRT initiated today. Per chart, pt appears to be up ~30lbs from his UBW of 257-259lbs. Will re-assess pt daily and initiate tube feeds when appropriate. Pt is likely at refeed risk.   Medications reviewed and include: allopurinol, colace, pepcid. Heparin, solu-cortef, protonix, Ca gluconate, epinephrine, levophed, neo-synephrine, zosyn, vancomycin, vasopressin   Labs reviewed: Na 127(L), K 5.1 wnl, BUN 90(H), creat 5.35(H), P 5.6(H), Mg 2.1 wnl Wbc- 16.3(H) Cbgs- 89, 75, 91 x 24 hrs  AIC 7.1(H)- 8/18  Patient is currently intubated on ventilator support MV: 9.8 L/min Temp (24hrs),  Avg:98.8 F (37.1 C), Min:98.2 F (36.8 C), Max:99.6 F (37.6 C)  Propofol: none   MAP- >4mmHg   NUTRITION - FOCUSED PHYSICAL EXAM: Unable to perform at this time   Diet Order:   Diet Order             Diet NPO time specified  Diet effective now                  EDUCATION NEEDS:   Not appropriate for education at this time  Skin:  Skin Assessment: Reviewed RN Assessment  Last BM:  pta  Height:   Ht Readings from Last 1 Encounters:  09/15/22 5\' 11"  (1.803 m)    Weight:   Wt Readings from Last 1 Encounters:  09/13/22 (!) 176.9 kg    Ideal Body Weight:  78 kg  BMI:  Body mass index is 54.41 kg/m.  Estimated Nutritional Needs:   Kcal:  1720-1954kcal/day  Protein:  165-195g/day  Fluid:  2.0L/day  Betsey Holiday MS, RD, LDN Please refer to [facility] for RD and/or RD on-call/weekend/after hours pager

## 2022-09-15 NOTE — Progress Notes (Signed)
ANTICOAGULATION CONSULT NOTE  Pharmacy Consult for heparin infusion Indication: pulmonary embolus  Allergies  Allergen Reactions   Talc     Nose bleeds    Patient Measurements: Height: 5\' 11"  (180.3 cm) Weight: (!) 176.9 kg (390 lb 1.6 oz) IBW/kg (Calculated) : 75.3 Heparin Dosing Weight: 113 kg  Vital Signs: Temp: 99.6 F (37.6 C) (08/20 0545) Temp Source: Axillary (08/20 0545) BP: 83/41 (08/20 0725) Pulse Rate: 84 (08/20 0800)  Labs: Recent Labs    09/13/22 2058 09/14/22 0323 09/14/22 1749 09/14/22 2133 09/15/22 0412 09/15/22 0810 09/15/22 1024  HGB  --  15.3  --  14.3 13.9  --   --   HCT  --  49.9  --  47.3 45.9  --   --   PLT  --  94*  --  100* 91*  --   --   CREATININE  --  5.16* 5.81* 5.74* 5.35*  --   --   TROPONINIHS 28*  --   --   --   --  33* 33*    Estimated Creatinine Clearance: 25.6 mL/min (A) (by C-G formula based on SCr of 5.35 mg/dL (H)).   Medical History: Past Medical History:  Diagnosis Date   Allergy    Anemia    Asthma    CHF (congestive heart failure) (HCC)    Diabetes mellitus without complication (HCC)    Dyspnea    Gout    Hyperlipidemia    Hypertension    Morbid obesity (HCC)     Medications:  Scheduled:   allopurinol  200 mg Oral Daily   artificial tears  1 Application Both Eyes Q8H   atorvastatin  20 mg Oral Daily   budesonide (PULMICORT) nebulizer solution  0.5 mg Nebulization BID   Chlorhexidine Gluconate Cloth  6 each Topical Q0600   cisatracurium  0.05 mg/kg Intravenous Once   docusate  100 mg Per Tube BID   etomidate       famotidine  20 mg Per Tube BID   heparin  5,000 Units Subcutaneous Q8H   hydrocortisone sod succinate (SOLU-CORTEF) inj  100 mg Intravenous Q12H   insulin aspart  0-9 Units Subcutaneous Q4H   ipratropium-albuterol  3 mL Nebulization Q4H   midazolam       pantoprazole (PROTONIX) IV  40 mg Intravenous Q12H   polyethylene glycol  17 g Per Tube Daily   sodium bicarbonate       sodium chloride  flush  3 mL Intravenous Q12H    Assessment: 57 year old male with past medical conditions including hyperlipidemia, morbid obesity, CHF, hypertension, chronic pain syndrome, chronic kidney disease stage IIIb, DM, gout and etoh abuse who is admitted with PNA, sepsis, possible cellulitis and AKI now on CRRT w/ presumed PE.   Goal of Therapy:  Heparin level 0.3-0.7 units/ml Monitor platelets by anticoagulation protocol: Yes   Plan:  Give 7000 units bolus x 1 Start heparin infusion at 2000 units/hr Check anti-Xa level in 6 hours and daily while on heparin Continue to monitor H&H and platelets  Lowella Bandy 09/15/2022,3:11 PM

## 2022-09-15 NOTE — Progress Notes (Signed)
OT Cancellation Note  Patient Details Name: [name] MRN: [id number] DOB: March 01, 1965   Cancelled Treatment:    Reason Eval/Treat Not Completed: Other (comment);Patient not medically ready (D/c OT orders. Pt now ventented and was transferred to the ICU overnight. Please re-consult when pt becomes appropriate to participate in therapy.)  Alvester Morin 09/15/2022, 9:43 AM

## 2022-09-15 NOTE — Progress Notes (Signed)
PHARMACIST - PHYSICIAN COMMUNICATION   CONCERNING: IV hydrocortisone   Current order:IV hydrocortisone 50 mg q6h     DESCRIPTION: Per [place] Protocol:   IV hydrocortisone will be converted to either a q8h or q12h frequency with the same total daily dose (TDD).  Ordered Dose: 1 to 200 mg TDD; convert to: TDD div q12h.  Ordered Dose: 201 to 300 mg TDD; convert to: TDD div q8h.  Ordered Dose: >300 mg TDD; DAW.  Order has been adjusted to: IV hydrocortisone 100mg  IV q12h   Angelique Blonder , PharmD Clinical Pharmacist  09/15/2022 7:19 AM

## 2022-09-15 NOTE — Progress Notes (Signed)
Pt transported to CT and returned to ED 10 on the BIPAP without incident.

## 2022-09-15 NOTE — Progress Notes (Signed)
[facility]  ROUNDING NOTE   Subjective:   [name] is a 57 year old male with past medical conditions including hyperlipidemia, obesity, CHF, hypertension, chronic pain syndrome, and chronic kidney disease stage IIIb.  Patient presents to the emergency department with complaints of shortness of breath.  He has been admitted for Hyperkalemia [E87.5] Acute pulmonary edema (HCC) [J81.0] Respiratory distress [R06.03] ESRD (end stage renal disease) (HCC) [N18.6] Acute respiratory failure with hypoxia and hypercapnia (HCC) [J96.01, J96.02] Acute renal failure superimposed on chronic kidney disease (HCC) [N17.9, N18.9]  Patient is known to our practice and is followed by Dr. Thedore Mins.  Patient was last seen in office on 07/27/2022 for routine follow-up.  Patient has been on hemodialysis in the past.    Update: Patient seen intubated in ICU Vent: 100% FiO2 Sedation:fentanyl Pressors: Levo and vaso CRRT ordered, awaiting line placement   Objective:  Vital signs in last 24 hours:  Temp:  [98.2 F (36.8 C)-99.6 F (37.6 C)] 99.6 F (37.6 C) (08/20 0545) Pulse Rate:  [26-81] 81 (08/20 0725) Resp:  [13-26] 17 (08/20 0725) BP: (64-122)/(38-84) 83/41 (08/20 0725) SpO2:  [85 %-100 %] 90 % (08/20 0850) FiO2 (%):  [40 %-100 %] 100 % (08/20 0850)  Weight change:  Filed Weights   09/13/22 1849 09/13/22 2050  Weight: (!) 158.3 kg (!) 176.9 kg    Intake/Output: I/O last 3 completed shifts: In: 35.8 [I.V.:35.8] Out: -    Intake/Output this shift:  Total I/O In: 1210.1 [I.V.:212.4; IV Piggyback:997.7] Out: -   Physical Exam: General: Ill-appearing  Head: Normocephalic, atraumatic.   Eyes: Icteric  Lungs:  Rhonchi, vent support  Heart: Regular rate and rhythm  Abdomen:  Soft, nontender, obese  Extremities: Trace peripheral edema.  Neurologic: Sedated  Skin: No lesions  Access: None    Basic Metabolic Panel: Recent Labs  Lab 09/12/22 0625 09/13/22 0413  09/13/22 1852 09/14/22 0323 09/14/22 1749 09/14/22 2133 09/15/22 0412  NA 122* 122* 126*  --  125* 124* 127*  K 5.9* 5.8* 5.7*  --  6.1* 6.3* 5.1  CL 90* 89* 89*  --  90* 89* 96*  CO2 23 22 28   --  25 24 22   GLUCOSE 142* 164* 135*  --  119* 114* 86  BUN 80* 88* 90*  --  93* 98* 90*  CREATININE 4.01* 4.52* 5.08* 5.16* 5.81* 5.74* 5.35*  CALCIUM 8.6* 8.7* 8.7*  --  8.8* 8.5* 10.0  MG 2.2  --   --   --  2.3 2.1 2.1  PHOS  --   --   --   --   --   --  5.6*    Liver Function Tests: Recent Labs  Lab 09/12/22 0625 09/13/22 2058 09/14/22 2133 09/15/22 0412  AST 54* 35 24 21  ALT 42 48* 42 34  ALKPHOS 164* 159* 132* 116  BILITOT 1.3* 1.2 1.5* 1.1  PROT 6.8 7.0 6.7 5.6*  ALBUMIN 3.0* 3.3* 3.0* 2.5*   No results for input(s): "LIPASE", "AMYLASE" in the last 168 hours. No results for input(s): "AMMONIA" in the last 168 hours.  CBC: Recent Labs  Lab 09/12/22 0625 09/13/22 0413 09/13/22 1852 09/14/22 0323 09/14/22 2133 09/15/22 0412  WBC 3.1* 3.4* 11.8* 15.3* 18.9* 16.3*  NEUTROABS 2.2  --   --   --   --   --   HGB 14.1 14.2 14.8 15.3 14.3 13.9  HCT 44.6 45.9 47.9 49.9 47.3 45.9  MCV 95.5 95.2 97.2 97.7 97.5  97.7  PLT 110* 110* 116* 94* 100* 91*    Cardiac Enzymes: No results for input(s): "CKTOTAL", "CKMB", "CKMBINDEX", "TROPONINI" in the last 168 hours.  BNP: Invalid input(s): "POCBNP"  CBG: Recent Labs  Lab 09/14/22 0948 09/14/22 1208 09/15/22 0607 09/15/22 0722  GLUCAP 117* 145* 91 75    Microbiology: Results for orders placed or performed during the hospital encounter of 09/12/22  SARS Coronavirus 2 by RT PCR (hospital order, performed in [facility] hospital lab) *cepheid single result test* Anterior Nasal Swab     Status: None   Collection Time: 09/12/22  6:25 AM   Specimen: Anterior Nasal Swab  Result Value Ref Range Status   SARS Coronavirus 2 by RT PCR NEGATIVE NEGATIVE Final    Comment: (NOTE) SARS-CoV-2 target nucleic acids are NOT  DETECTED.  The SARS-CoV-2 RNA is generally detectable in upper and lower respiratory specimens during the acute phase of infection. The lowest concentration of SARS-CoV-2 viral copies this assay can detect is 250 copies / mL. A negative result does not preclude SARS-CoV-2 infection and should not be used as the sole basis for treatment or other patient management decisions.  A negative result may occur with improper specimen collection / handling, submission of specimen other than nasopharyngeal swab, presence of viral mutation(s) within the areas targeted by this assay, and inadequate number of viral copies (<250 copies / mL). A negative result must be combined with clinical observations, patient history, and epidemiological information.  Fact Sheet for Patients:   RoadLapTop.co.za  Fact Sheet for Healthcare Providers: http://kim-miller.com/  This test is not yet approved or  cleared by the [place] FDA and has been authorized for detection and/or diagnosis of SARS-CoV-2 by FDA under an Emergency Use Authorization (EUA).  This EUA will remain in effect (meaning this test can be used) for the duration of the COVID-19 declaration under Section 564(b)(1) of the Act, 21 U.S.C. section 360bbb-3(b)(1), unless the authorization is terminated or revoked sooner.  Performed at [facility], [address] [place], [place] [address]     Coagulation Studies: No results for input(s): "LABPROT", "INR" in the last 72 hours.  Urinalysis: No results for input(s): "COLORURINE", "LABSPEC", "PHURINE", "GLUCOSEU", "HGBUR", "BILIRUBINUR", "KETONESUR", "PROTEINUR", "UROBILINOGEN", "NITRITE", "LEUKOCYTESUR" in the last 72 hours.  Invalid input(s): "APPERANCEUR"    Imaging: DG Chest Port 1 View  Result Date: 09/15/2022 CLINICAL DATA:  Status post intubation EXAM: PORTABLE CHEST 1 VIEW COMPARISON:  Chest radiograph dated 09/15/2022 at 7  a.m. FINDINGS: Lines/tubes: Endotracheal tube tip projects 3.8 cm above the carina. Enteric tube tip reaches the diaphragm and terminates below the field of view. Right internal jugular venous catheter tip projects over the SVC. Lungs: Asymmetric right lung opacification. Slightly improved lung aeration with persistent dense left retrocardiac opacity. Pleura: Large right pleural effusion.  No pneumothorax. Heart/mediastinum: Right heart border is obscured. Bones: No acute osseous abnormality. IMPRESSION: 1.  Support apparatus as described. 2. Large right pleural effusion with asymmetric right lung opacification. 3. Slightly improved lung aeration with persistent dense left retrocardiac opacity, likely atelectasis. Electronically Signed   By: Agustin Cree M.D.   On: 09/15/2022 09:35   DG Chest Port 1 View  Result Date: 09/15/2022 CLINICAL DATA:  [id number].  Check central line positioning. EXAM: PORTABLE CHEST 1 VIEW COMPARISON:  AP Lat chest 09/13/2022 at 7:08 p.m. FINDINGS: 7:00 a.m. A right IJ double-lumen catheter has been inserted with the tip in the upper SVC slightly above the azygous confluence. There is  no visible pneumothorax. The heart is moderately enlarged. Central vascular engorgement continues to be noted. Increased moderate right-greater-than-left pleural effusions are noted today with right pleural fluid tracking to the apex. There is increased opacity in the left lower lobe and in the right lower lung field, consistent with pneumonia or atelectasis. The upper lung fields remain relatively clear. The mediastinum is normally outlined. There is aortic atherosclerosis. Moderate thoracic spondylosis. No new osseous findings. Overlying monitor wires. IMPRESSION: 1. Right IJ double-lumen catheter tip in the upper SVC slightly above the azygous confluence. No visible pneumothorax. 2. Increased moderate right-greater-than-left pleural effusions. 3. Increased opacity in the left lower lobe and in the right lower  lung field consistent with pneumonia or atelectasis. Electronically Signed   By: Almira Bar M.D.   On: 09/15/2022 07:25   CT ABDOMEN PELVIS WO CONTRAST  Result Date: 09/14/2022 CLINICAL DATA:  Persistent transaminitis and worsening leukocytosis. Suspected sepsis. EXAM: CT ABDOMEN AND PELVIS WITHOUT CONTRAST TECHNIQUE: Multidetector CT imaging of the abdomen and pelvis was performed following the standard protocol without IV contrast. RADIATION DOSE REDUCTION: This exam was performed according to the departmental dose-optimization program which includes automated exposure control, adjustment of the mA and/or kV according to patient size and/or use of iterative reconstruction technique. COMPARISON:  CT 04/23/2022 FINDINGS: Lower chest: Large right pleural effusion. Complete collapse of the right lower lobe. Hepatobiliary: Cholelithiasis without evidence cholecystitis. Unremarkable noncontrast appearance of the liver. Pancreas: No acute abnormality. Spleen: Unremarkable. Adrenals/Urinary Tract: Stable adrenal glands. No urinary calculi or hydronephrosis. Nondistended bladder. Stomach/Bowel: Stomach is within normal limits. No obstruction. No bowel wall thickening. Tentative visualization of a normal appendix. Vascular/Lymphatic: Aortic atherosclerosis. No enlarged abdominal or pelvic lymph nodes. Reproductive: Unremarkable. Other: Body wall anasarca. Small volume perihepatic ascites. No free intraperitoneal air Musculoskeletal: No acute fracture. IMPRESSION: 1. Large right pleural effusion with complete collapse of the right lower lobe. Pneumonia is difficult to exclude. 2. Small volume perihepatic ascites. 3. Cholelithiasis without evidence of cholecystitis. 4. Body wall anasarca. Aortic Atherosclerosis (ICD10-I70.0). Electronically Signed   By: Minerva Fester M.D.   On: 09/14/2022 23:59   DG Chest 2 View  Result Date: 09/13/2022 CLINICAL DATA:  Shortness of breath. EXAM: CHEST - 2 VIEW COMPARISON:   09/13/2022 at 0433 hours. FINDINGS: Unchanged cardiomegaly and pulmonary venous congestion. Increased bibasilar airspace opacities. No definite pulmonary edema, pleural effusion or pneumothorax. IMPRESSION: Unchanged cardiomegaly and pulmonary venous congestion. Increased bibasilar airspace opacities. Electronically Signed   By: Orvan Falconer M.D.   On: 09/13/2022 19:48     Medications:    sodium chloride Stopped (09/14/22 2352)   epinephrine 2.5 mcg/min (09/15/22 0946)   fentaNYL infusion INTRAVENOUS 50 mcg/hr (09/15/22 0946)   midazolam     norepinephrine (LEVOPHED) Adult infusion 80 mcg/min (09/15/22 0946)   phenylephrine (NEO-SYNEPHRINE) Adult infusion 20 mcg/min (09/15/22 1005)   piperacillin-tazobactam (ZOSYN)  IV     PrismaSol BGK 2/3.5 400 mL/hr at 09/15/22 0927   PrismaSol BGK 2/3.5 400 mL/hr at 09/15/22 0928   PrismaSol BGK 2/3.5 2,500 mL/hr at 09/15/22 0927   vancomycin     vasopressin 0.04 Units/min (09/15/22 0946)    allopurinol  200 mg Oral Daily   atorvastatin  20 mg Oral Daily   budesonide (PULMICORT) nebulizer solution  0.5 mg Nebulization BID   Chlorhexidine Gluconate Cloth  6 each Topical Q0600   docusate  100 mg Per Tube BID   etomidate       famotidine  20 mg Per Tube  BID   heparin  5,000 Units Subcutaneous Q8H   hydrocortisone sod succinate (SOLU-CORTEF) inj  100 mg Intravenous Q12H   ipratropium-albuterol  3 mL Nebulization Q4H   midazolam       pantoprazole (PROTONIX) IV  40 mg Intravenous Q12H   polyethylene glycol  17 g Per Tube Daily   sodium chloride flush  3 mL Intravenous Q12H   acetaminophen **OR** acetaminophen, albuterol, etomidate, fentaNYL, fluticasone, heparin, midazolam, midazolam, ondansetron **OR** ondansetron (ZOFRAN) IV, vecuronium  Assessment/ Plan:  [name] is a 57 y.o.  male with past medical conditions including hyperlipidemia, obesity, CHF, hypertension, chronic pain syndrome, and chronic kidney disease stage IIIb.   Patient presents to the emergency department with complaints of shortness of breath.  He has been admitted for Hyperkalemia [E87.5] Acute pulmonary edema (HCC) [J81.0] Respiratory distress [R06.03] ESRD (end stage renal disease) (HCC) [N18.6] Acute respiratory failure with hypoxia and hypercapnia (HCC) [J96.01, J96.02] Acute renal failure superimposed on chronic kidney disease (HCC) [N17.9, N18.9]   Acute kidney injury on chronic kidney disease stage IIIb.  Baseline creatinine appears to be 2.21 with GFR 34 on 07/27/2022. Acute kidney injury likely secondary to volume overload. Patient received IV furosemide in ED with no urine output  Patient has required temporary in the past.   Placed on 2 pressors and due to instability, decision made to initiate CRRT. Orders placed. Appreciate critical care team placing HD access.   2. Acute respiratory failure, required Bipap ion ED, now intubated. Chest xray showing vascular congestion.  Critical care team to manage    3. Hyponatremia/ hyperkalemia. Sodium 122 and potassium 5.9. Sodium slowly correcting with potassium. Will manage electrolytes with CRRT. Placed on 2K bath    LOS: 1   8/20/202410:51 AM

## 2022-09-15 NOTE — Progress Notes (Signed)
Vent settings changed to 16/600/15/100% per ABG results 7.21/70/63/28

## 2022-09-15 NOTE — Procedures (Signed)
Intubation Procedure Note  [name]  [id number]  December 12, 1965  Date:09/15/22  Time:8:46 AM   Provider Performing:Jalien Weakland D Elvina Sidle    Procedure: Intubation (31500)  Indication(s) Respiratory Failure  Consent Unable to obtain consent due to emergent nature of procedure.   Anesthesia Versed, Fentanyl, and Rocuronium   Time Out Verified patient identification, verified procedure, site/side was marked, verified correct patient position, special equipment/implants available, medications/allergies/relevant history reviewed, required imaging and test results available.   Sterile Technique Usual hand hygeine, masks, and gloves were used   Procedure Description Patient positioned in bed supine.  Sedation given as noted above.  Patient was intubated with endotracheal tube using Glidescope.  View was Grade 1 full glottis .  Number of attempts was 1.  Colorimetric CO2 detector was consistent with tracheal placement.   Complications/Tolerance None; patient tolerated the procedure well. Chest X-ray is ordered to verify placement.   EBL Minimal   Specimen(s) None   Size 8.0 ETT Tube secured at the 25 cm mark at the lip.   [name], AGACNP-BC [facility] Prefer epic messenger for cross cover needs If after hours, please call E-link

## 2022-09-15 NOTE — Progress Notes (Signed)
eLink Physician-Brief Progress Note Patient Name: TREIGHTON MEZEY DOB: Dec 08, 1965 MRN: [id number]   Date of Service  09/15/2022  HPI/Events of Note  57 year old with no official diagnosis of COPD but rather history of asthma who initially presented to the emergency department with increasing shortness of breath, worsening edema, and oliguria.  He has had progressively worsening encephalopathy and reduced level of consciousness with refractory hypercapnic respiratory failure in the setting of severe acidosis seems like a combination of metabolic and respiratory acidosis.  Previously evaluated by nephrology and RRT was called.  eICU Interventions  After discussing the case, decision was made to initiate continuous renal replacement therapy with temporary cath in place.  Attempt aggressive fluid removal and solute clearance.  Still an extremely high risk for intubation, but currently protecting his airway.  Subcutaneous for DVT prophylaxis Pantoprazole for GI prophylaxis.     Intervention Category Evaluation Type: New Patient Evaluation  Terrianne Cavness 09/15/2022, 6:27 AM

## 2022-09-15 NOTE — Progress Notes (Signed)
PT Cancellation Note  Patient Details Name: [name] MRN: [id number] DOB: February 22, 1965   Cancelled Treatment:    Reason Eval/Treat Not Completed: Other (comment). Chart reviewed and pt with change in status, currently on vent and receiving CRRT. Will dc current PT order. Please re-order once medically appropriate.   Jessly Lebeck 09/15/2022, 10:08 AM Elizabeth Palau, PT, DPT, GCS [phone]

## 2022-09-15 NOTE — Procedures (Signed)
Central Venous Catheter Insertion Procedure Note  [name]  [id number]  12-21-1965  Date:09/15/22  Time:9:44 AM   Provider Performing:Aquinnah Devin D Elvina Sidle   Procedure: Insertion of Non-tunneled Central Venous Catheter(36556) with US guidance ([id number])   Indication(s) Medication administration and Difficult access  Consent Unable to obtain consent due to emergent nature of procedure.  Anesthesia Topical only with 1% lidocaine   Timeout Verified patient identification, verified procedure, site/side was marked, verified correct patient position, special equipment/implants available, medications/allergies/relevant history reviewed, required imaging and test results available.  Sterile Technique Maximal sterile technique including full sterile barrier drape, hand hygiene, sterile gown, sterile gloves, mask, hair covering, sterile ultrasound probe cover (if used).  Procedure Description Area of catheter insertion was cleaned with chlorhexidine and draped in sterile fashion.  With real-time ultrasound guidance a central venous catheter was placed into the left internal jugular vein. Nonpulsatile blood flow and easy flushing noted in all ports.  The catheter was sutured in place and sterile dressing applied.  Complications/Tolerance None; patient tolerated the procedure well. Chest X-ray is ordered to verify placement for internal jugular or subclavian cannulation.   Chest x-ray is not ordered for femoral cannulation.  EBL Minimal  Specimen(s) None   BIOPATCH applied to the insertion site Line secured at the 20 cm site.   [name], AGACNP-BC [facility] Prefer epic messenger for cross cover needs If after hours, please call E-link

## 2022-09-15 NOTE — Procedures (Signed)
Central Venous Catheter Insertion Procedure Note  [name]  [id number]  12-26-1965  Date:09/15/22  Time:7:20 PM   Provider Performing:Akin Yi L Rust-Chester   Procedure: Insertion of Non-tunneled Central Venous [phone]) with US guidance ([id number])   Indication(s) Medication administration and Hemodialysis  Consent Risks of the procedure as well as the alternatives and risks of each were explained to the patient and/or caregiver.  Consent for the procedure was obtained and is signed in the bedside chart  Anesthesia Topical only with 1% lidocaine  & 25 mcg Fentanyl IVP  Timeout Verified patient identification, verified procedure, site/side was marked, verified correct patient position, special equipment/implants available, medications/allergies/relevant history reviewed, required imaging and test results available.  Sterile Technique Maximal sterile technique including full sterile barrier drape, hand hygiene, sterile gown, sterile gloves, mask, hair covering, sterile ultrasound probe cover (if used).  Procedure Description Area of catheter insertion was cleaned with chlorhexidine and draped in sterile fashion.  With real-time ultrasound guidance a HD catheter was placed into the right internal jugular vein. Nonpulsatile blood flow and easy flushing noted in all ports.  The catheter was sutured in place and sterile dressing applied.  Complications/Tolerance None; patient tolerated the procedure well. Chest X-ray is ordered to verify placement for internal jugular or subclavian cannulation.   Chest x-ray is not ordered for femoral cannulation.  EBL Minimal  Specimen(s) None   Betsey Holiday, AGACNP-BC Acute Care Nurse Practitioner [facility]   [phone] / [phone] Please see Amion for details.

## 2022-09-15 NOTE — Progress Notes (Signed)
Pt transported to ICU 7 on BIPAP without incident. Pt remains on the BIPAP and is tol well at this time. Report given to ICU RT.

## 2022-09-15 NOTE — Progress Notes (Signed)
   09/15/22 1000  Spiritual Encounters  Type of Visit Initial  Care provided to: Pt and family  Conversation partners present during encounter Nurse  Referral source Nurse (RN/NT/LPN)  Reason for visit Trauma  OnCall Visit Yes  Spiritual Framework  Presenting Themes Significant life change;Impactful experiences and emotions  Family Stress Factors Health changes;Loss   Chaplain responded to nurse's request to provide support to family of patient recently intubated.

## 2022-09-15 NOTE — Progress Notes (Signed)
*  PRELIMINARY RESULTS* Echocardiogram 2D Echocardiogram has been performed.  Cristela Blue 09/15/2022, 1:09 PM

## 2022-09-15 NOTE — Significant Event (Signed)
1433: Responded to code blue. On arrival effective CPR being performed. Pt taken off vent and bagged with high flow O2.Pt was pulseless, with a brady rhythm and then PEA.  1433: Epinephrine given followed with saline flush 1436 Epinephrine given followed with saline flush 1436 Calcium given, followed with saline flush 1438 Bicarb given followed with saline flush 1438: Pulse palpated, ST on monitor. Through out code, effective CPR was performed, with change out of cpr provider. Leanord Asal NP present, Dr. Santiago Glad present.

## 2022-09-15 NOTE — Progress Notes (Signed)
Patient arrived to unit on BiPAP and levo. Emergent HD line placement as soon as patient got to unit. Unable to do a full assessment.

## 2022-09-15 NOTE — Progress Notes (Addendum)
BRIEF PCCM NOTE:  Pt with near cardiac arrest event.  As pt was being turned to removed soiled linens, followed by obtaining CXR and KUB, he suddenly become hypotensive with SBP in 40's via A-line (ended up requiring 4 vasopressors: Levo/Vasopressin/Neo/Epi) and hypoxic with O2 sats dropping to as low as 3%.  Surprisingly somehow, pt Never lost a pulse during the event.  He was given 1 amp epi, 1 amp bicarb, 1g Calcium.  He was manually bagged with peak valve, then placed on vent with increase in PEEP to 15, and TV increased to 600 and RR decreased to 16 given evidence of air trapping on flow sensor.    Following the above interventions, O2 sats have improved to mid 90's, and pt now hypertensive with SBP 190's via A-line, of which have been able to wean back to 2 vasopressors (now on Levophed and Vasopressin).    Pt is too unstable for further turning, procedures, or trips outside of ICU at this time.  Will repeat ABG shortly.   Pt's family (pts mother and daughter) was at bedside during the event, Updated by Dr. Belia Heman.  Currently remains full code, will consult Palliative Care to assist with GOC.    Additional Critical Care Time: 20 minutes  [name], AGACNP-BC [facility] Prefer epic messenger for cross cover needs If after hours, please call E-link

## 2022-09-15 NOTE — Procedures (Signed)
Arterial Catheter Insertion Procedure Note  [name]  [id number]  10-27-1965  Date:09/15/22  Time:7:53 AM    Provider Performing: Judithe Modest    Procedure: Insertion of Arterial Line (84132) with US guidance ([id number])   Indication(s) Blood pressure monitoring and/or need for frequent ABGs  Consent Unable to obtain consent due to emergent nature of procedure.  Anesthesia None   Time Out Verified patient identification, verified procedure, site/side was marked, verified correct patient position, special equipment/implants available, medications/allergies/relevant history reviewed, required imaging and test results available.   Sterile Technique Maximal sterile technique including full sterile barrier drape, hand hygiene, sterile gown, sterile gloves, mask, hair covering, sterile ultrasound probe cover (if used).   Procedure Description Area of catheter insertion was cleaned with chlorhexidine and draped in sterile fashion. With real-time ultrasound guidance an arterial catheter was placed into the right radial artery.  Appropriate arterial tracings confirmed on monitor.     Complications/Tolerance None; patient tolerated the procedure well.   EBL Minimal   Specimen(s) None   [name], AGACNP-BC [facility] Prefer epic messenger for cross cover needs If after hours, please call E-link

## 2022-09-15 NOTE — Consult Note (Signed)
Consultation Note Date: 09/15/2022 at 1100  Patient Name: [name]  DOB: May 06, 1965  MRN: [id number]  Age / Sex: 57 y.o., male  PCP: Carren Rang, PA-C Referring Physician: Erin Fulling, MD  Reason for Consultation: Establishing goals of care  HPI/Patient Profile: 57 y.o. male  with past medical history of HTN, HLD, type 2 diabetes, morbid obesity, CKD (followed by nephrology, has excepted HD in the past), CHF, and chronic pain admitted on 09/13/2022 with SOB.  Course of hospitalization has included failing trial of BiPAP which required intubation and mechanical ventilatory support with initiation of CRRT for AKI.  8/20, patient had near cardiac arrest event in ICU while being turned/linens changed.  Patient currently on Levophed and vasopressin with full ventilatory support.  PMT was consulted to assist with goals of care discussions.  Clinical Assessment and Goals of Care: I have reviewed medical records including EPIC notes, labs and imaging, assessed the patient and counseled with NP Elvina Sidle and RNs Maralyn Sago and [place]. Patient is on vent support with CRRT. He is unable to participate in discussions at this time.   After assessing the patient, I spoke with his mother Carolan Clines over the phone. I introduced Palliative Medicine as specialized medical care for people living with serious illness. It focuses on providing relief from the symptoms and stress of a serious illness. The goal is to improve quality of life for both the patient and the family.  I attempted to gauge Pearl's understanding of her son's current medical situation.  She shares she knows he is in critical condition.  She outlines that she has been told by several doctors that he is very sick and not doing well.  I attempted to elicit goals and values important to the patient and Pearl. She shares Jorrell is bullheaded, never wanted to go  to the doctor, and did not take care of himself, despite her pleas. She recognizes his condition is poor but is hoping for the best and leaving it in God's hands.  Therapeutic silence, active listening, and emotional support provided.  Human mortality and the limitations of our earthly bodies discussed.  Brief life review. 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  He was married and his wife passed about 10 years ago.  He has 1 adopted son and 1 daughter, both who live locally.  Carolan Clines shares she has not been in contact with patient's son but was at the hospital earlier with patient's daughter.  She plans to notify patient's son of his condition today.  Discussed patient's multiorgan failure in the setting of chronic illnesses.  Reviewed his need for ventilatory support, CRRT, and IV medications to support his BP.   Discussed events surrounding almost cardiac arrest event this morning.  Reviewed CODE STATUS, allowing a natural passing, and avoiding CPR/defibrillation in the event of a cardiac arrest. Carolan Clines says she will consider a DNR code status but wishes to speak with her granddaughter. Patient remains full  code.    Family is facing treatment option decisions, advanced directive, and anticipatory care needs.    Discussed with patient/family the importance of continued conversation with family and the medical providers regarding overall plan of care and treatment options, ensuring decisions are within the context of the patient's values and GOCs.    Questions and concerns were addressed. PMT will continue to follow.   Full code and full scope remain.   Primary Decision Maker NEXT OF KIN  Physical Exam Vitals reviewed.  Constitutional:      Appearance: He is obese. He is ill-appearing.  HENT:     Head: Normocephalic.  Cardiovascular:     Rate and Rhythm: Normal rate.  Pulmonary:     Comments: Ventilatory  support Chest:     Chest wall: No mass or tenderness.  Skin:    General: Skin is warm and dry.  Psychiatric:        Behavior: Behavior is not agitated.     Palliative Assessment/Data: 20%     Thank you for this consult. Palliative medicine will continue to follow and assist holistically.   Time Total: 75 minutes  Signed by: Georgiann Cocker, DNP, FNP-BC Palliative Medicine    Please contact Palliative Medicine Team phone at [phone] for questions and concerns.  For individual provider: See Loretha Stapler

## 2022-09-15 NOTE — IPAL (Signed)
  Interdisciplinary Goals of Care Family Meeting   Date carried out: 09/15/2022  Location of the meeting: Bedside  Member's involved: Physician, Nurse Practitioner, Bedside Registered Nurse, and Family Member or next of kin      GOALS OF CARE DISCUSSION  The Clinical status was relayed to family in detail- Mother at Bedside  Updated and notified of patients medical condition- Patient remains unresponsive and will not open eyes to command.   Patient is having a weak cough and struggling to remove secretions.   Patient with increased WOB and using accessory muscles to breathe Explained to family course of therapy and the modalities   Patient with Progressive multiorgan failure with a very high probablity of a very minimal chance of meaningful recovery despite all aggressive and optimal medical therapy.  PATIENT REMAINS FULL CODE  Family understands the situation. Multiple organ failure  Family are satisfied with Plan of action and management. All questions answered  Additional CC time 45 mins   Lakrista Scaduto Santiago Glad, M.D.  [facility]  Medical Director [facility] Medical Director [facility] Cardio-P[facility] Department

## 2022-09-15 NOTE — Progress Notes (Signed)
Pharmacy Antibiotic Note  [name] is a 57 y.o. male w/ PMH of hypertension, hyperlipidemia, diabetes, CKD, CHF, and chronic pain syndrome admitted on 09/13/2022 with sepsis.  Pharmacy has been consulted for vancomycin and Zosyn dosing. Initially admitted w/ AKI now started on CRRT 08/20  Plan: start Zosyn 3.375g IV every 6 hours ---CTM CRRT status for dosing changes   Height: 5\' 11"  (180.3 cm) Weight: (!) 176.9 kg (390 lb 1.6 oz) IBW/kg (Calculated) : 75.3  Temp (24hrs), Avg:98.8 F (37.1 C), Min:98.2 F (36.8 C), Max:99.6 F (37.6 C)  Recent Labs  Lab 09/13/22 0413 09/13/22 1852 09/14/22 0323 09/14/22 1749 09/14/22 2133 09/14/22 2312 09/15/22 0412  WBC 3.4* 11.8* 15.3*  --  18.9*  --  16.3*  CREATININE 4.52* 5.08* 5.16* 5.81* 5.74*  --  5.35*  LATICACIDVEN  --   --   --   --   --  1.3  --     Estimated Creatinine Clearance: 25.6 mL/min (A) (by C-G formula based on SCr of 5.35 mg/dL (H)).    Allergies  Allergen Reactions   Talc     Nose bleeds    Antimicrobials this admission: 08/20 vancomycin x 1 08/21 linezolid >> 08/20 azithromycin >> 08/20 Zosyn >>   Microbiology results: 08/20 BCx: pending  Thank you for allowing pharmacy to be a part of this patient's care.  Lowella Bandy 09/15/2022 7:12 AM

## 2022-09-15 NOTE — Progress Notes (Signed)
PHARMACY CONSULT NOTE  Pharmacy Consult for Electrolyte Monitoring and Replacement   Recent Labs: Potassium (mmol/L)  Date Value  09/15/2022 5.1   Magnesium (mg/dL)  Date Value  84/69/6295 2.1   Calcium (mg/dL)  Date Value  28/41/3244 10.0   Albumin (g/dL)  Date Value  01/28/7251 2.5 (L)  08/07/2014 3.3 (L)   Phosphorus (mg/dL)  Date Value  66/44/0347 5.6 (H)   Sodium (mmol/L)  Date Value  09/15/2022 127 (L)  08/07/2014 142     Assessment: 57 year old male with past medical conditions including hyperlipidemia, morbid obesity, CHF, hypertension, chronic pain syndrome, chronic kidney disease stage IIIb, DM, gout and etoh abuse who is admitted with PNA, sepsis, possible cellulitis and AKI now on CRRT.   Goal of Therapy:  Electrolytes WNL  Plan:  ---no electrolyte replacement warranted for now ---renal function panel BID per CRRT protocol  Lowella Bandy ,PharmD Clinical Pharmacist 09/15/2022 6:59 AM

## 2022-09-15 NOTE — Progress Notes (Signed)
Patient arouses to stimulation,follows minimal commands, on bipap with pressure support. Right lower extremity, tender and warm. MD made aware.   Patient more somnolent on bipap. Patient intubated and central line placed.  Patient decompensated rapidly after turning for soiled linens. Additional pressures started per orders. NP made aware. Patient continued to decompensate. Maps in 40s per Art line, with decreased O2 saturation. NP and Respiratory at bedside. Patient manually bagged. Epi, bicarb, and calcium given. O2 improved, patient became hypertensive, blood pressors weaned back down. Family at bedside, education provided.  Patient too unstable for turns.  CRRT started, no complications. Started at no fluid removal per MD.  CRRT high filter pressures, unable to run, blood unable to be returned.   CRRT resumed, at no fluid removal.  Patient started coughing at vent check, PRN medication given.Patient continued to decompensate. Patient became severely hypotensive and hypoxic, code blue called, CPR started and ACLS began. Patient received Epi, bicarb, and Calicum. ROSC returned. Patient became hypertensive with improved sats. Pressors weaned down. Family notified.   Family updated at bedside, patient code status changed to DNR.   No urine output per shift, patient bladder scanned throughout shift. NP made aware.  Bis and To4 monitoring in place.

## 2022-09-15 NOTE — IPAL (Signed)
  Interdisciplinary Goals of Care Family Meeting   Date carried out: 09/15/2022  Location of the meeting: Bedside  Member's involved: Physician, Nurse Practitioner, Bedside Registered Nurse, and Family Member or next of kin      GOALS OF CARE DISCUSSION  The Clinical status was relayed to family in detail- Mother and Daughter at bedside  RN reported that pt coughing and asynchronous with vent, then he abruptly decompensated again became severely hypotensive (SBP 30's) and severely hypoxic (O2 sats in single digits), with dampened waveform on A-line, which then progressed to PEA cardiac arrest at 14:38     Code blue called, CPR and ACLS begun immediately.  He received 2 amps of Epi, 2 amps of bicarb, and 1 g Calcium with return of ROSC. During the Code, Epinephrine drip was resumed at 20 mcg/hr.   With ROSC, SBP noted to be around 200, and O2 sats improved  to mid 90's. Will start empiric Heparin gtt for presumed PE.  Pt is critically ill with multiorgan failure now s/p cardiac arrest.  Pt's prognosis is EXTREMELY POOR, concern that he may exhibit evidence anoxic brain injury, and unlikely that he will survive the next 24 hrs. He is suffering and in the dying process.    Updated and notified of patients medical condition- Patient remains unresponsive and will not open eyes to command.   Patient is having a weak cough and struggling to remove secretions.    Patient with increased WOB and using accessory muscles to breathe Explained to family course of therapy and the modalities   Patient with Progressive multiorgan failure with a very high probablity of a very minimal chance of meaningful recovery despite all aggressive and optimal medical therapy.    Family understands the situation.  They have consented and agreed to DNR status   Family are satisfied with Plan of action and management. All questions answered  Additional CC time 36 mins   [name] [name], M.D.  [facility]  Medical Director [facility] Medical Director [facility] Cardio-Pulmonary Department

## 2022-09-15 NOTE — ED Notes (Signed)
IVC/PT HAS BEEN ADMITTED TO ICU 7
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
## 2022-09-16 ENCOUNTER — Inpatient Hospital Stay: Payer: PPO

## 2022-09-16 DIAGNOSIS — Z9911 Dependence on respirator [ventilator] status: Secondary | ICD-10-CM

## 2022-09-16 DIAGNOSIS — I469 Cardiac arrest, cause unspecified: Secondary | ICD-10-CM | POA: Insufficient documentation

## 2022-09-16 DIAGNOSIS — A419 Sepsis, unspecified organism: Secondary | ICD-10-CM | POA: Insufficient documentation

## 2022-09-16 DIAGNOSIS — R579 Shock, unspecified: Secondary | ICD-10-CM | POA: Insufficient documentation

## 2022-09-16 LAB — BLOOD GAS, ARTERIAL
Acid-base deficit: 6.5 mmol/L — ABNORMAL HIGH (ref 0.0–2.0)
Acid-base deficit: 8.6 mmol/L — ABNORMAL HIGH (ref 0.0–2.0)
Bicarbonate: 22.5 mmol/L (ref 20.0–28.0)
Bicarbonate: 13.9 mmol/L — ABNORMAL LOW (ref 20.0–28.0)
FIO2: 100 %
FIO2: 100 %
MECHVT: 600 mL
MECHVT: 600 mL
Mechanical Rate: 16
Mechanical Rate: 16
O2 Saturation: 98.3 %
O2 Saturation: 96.6 %
PEEP: 15 cmH2O
PEEP: 15 cmH2O
Patient temperature: 37
Patient temperature: 37
pCO2 arterial: 59 mmHg — ABNORMAL HIGH (ref 32–48)
pCO2 arterial: 22 mmHg — ABNORMAL LOW (ref 32–48)
pH, Arterial: 7.19 — CL (ref 7.35–7.45)
pH, Arterial: 7.41 (ref 7.35–7.45)
pO2, Arterial: 84 mmHg (ref 83–108)
pO2, Arterial: 71 mmHg — ABNORMAL LOW (ref 83–108)

## 2022-09-16 LAB — RESPIRATORY PANEL BY PCR

## 2022-09-16 LAB — GLUCOSE, CAPILLARY: Glucose-Capillary: 141 mg/dL — ABNORMAL HIGH (ref 70–99)

## 2022-09-16 LAB — HEPATITIS B SURFACE ANTIBODY, QUANTITATIVE: Hep B S AB Quant (Post): <3.5 m[IU]/mL — ABNORMAL LOW

## 2022-09-16 MED ORDER — HEPARIN BOLUS VIA INFUSION
3400.0000 [IU] | Freq: Once | INTRAVENOUS | Status: AC
  Administered 2022-09-16: 3400 [IU] via INTRAVENOUS
  Filled 2022-09-16: qty 3400

## 2022-09-20 LAB — CULTURE, BLOOD (ROUTINE X 2)
Culture: NO GROWTH
Culture: NO GROWTH
Special Requests: ADEQUATE

## 2022-09-27 NOTE — Progress Notes (Signed)
Paged for pt death. Visited with family and offered compassionate presence and listening ear. Family expressed no needs at this time sharing they felt they knew this was coming, as pt was sick. Pt mother and daughter at bedside, shared their family pastor was involved in support and they felt well supported at this time. Please page if needs arise.

## 2022-09-27 NOTE — Progress Notes (Signed)
[id number] NP Cheryll Cockayne Rust chester instructed nurse to start Neo if Paitents Map drop below 65. BP 84/55 (64) Neo started. While nurse was going to draw labs patient coughed on the vent. Patient became hypotensive with BP in the 30s/40s. Provider at bed time Neo increased to Max dose. Patient went into Vfib then asystole time of death called at 60.

## 2022-09-27 NOTE — Progress Notes (Signed)
Patients BIS score in the 30s  and patient not twitching with TOF. Nimbex drip titrated off Cheryll Cockayne Andrey Farmer NP aware.  Sedation also titrated down per NP.

## 2022-09-27 NOTE — Death Summary Note (Signed)
DEATH SUMMARY   Patient Details  Name: [name] MRN: [id number] DOB: 12/07/1965  Admission/Discharge Information   Admit Date:  10/11/2022  Date of Death:  October 14, 2022  Time of Death:  03:10  Length of Stay: 2  Referring Physician: Carren Rang, PA-C   Reason(s) for Hospitalization  Shortness of breath requiring BIPAP & acute renal failure needing dialysis  Diagnoses  Preliminary cause of death: Acute Renal Failure superimposed on CKD Stage 3b Secondary Diagnoses (including complications and co-morbidities):  Principal Problem:   Respiratory distress Active Problems:   CKD (chronic kidney disease), stage IV (HCC)   Transaminitis   Anemia in stage 4 chronic kidney disease (HCC)   Encounter for continuous renal replacement therapy (CRRT) for acute renal failure (HCC)   Type 2 diabetes mellitus with stage 3 chronic kidney disease, without long-term current use of insulin (HCC)   Confusion and disorientation   Acute respiratory failure with hypoxia and hypercapnia (HCC)   Acute on chronic diastolic CHF (congestive heart failure) (HCC)   Acute renal failure superimposed on chronic kidney disease (HCC)   Cardiac arrest, cause unspecified (HCC)   Severe sepsis (HCC)   Refractory shock (HCC)   On mechanically assisted ventilation [facility])   Brief Hospital Course (including significant findings, care, treatment, and services provided and events leading to death)  [name] is a 57 y.o. year old male from home via EMS on 10-11-22 for evaluation of shortness of breath. Upon arrival patient reported feeling increasingly short of breath over the course of Oct 11, 2022.  He also endorsed increased edema in his BLE and that in the last 24 hours he has only urinated a couple of times. He stated he was scheduled to see his nephrologist on 09/14/2022 and had been told he has extra fluid on his body.  In ED patient denied fevers/cough/chest pain. EMS reported the patient' SpO2 at 88%  on room air, was placed on 6 L nasal cannula with improvement to 92%.   ED course: Upon arrival patient alert and responsive requesting supplemental oxygen as well as a breathing treatment reporting he does not have oxygen at home.  While in the ED patient increasingly confused in the setting of uremia and hypercapnia, continuing to attempt to leave AMA. Patient placed under IVC requiring BIPAP support, given lasix IV & Lokelma after consultation with nephrology. Labs significant for hyponatremia, hyperkalemia, AKI, troponin mildly elevated but flat, mild Transaminitis, thrombocytopenic with leukopenia that quickly changed to mild leukocytosis, VBG consistent with respiratory acidosis revealing hypercapnia, recent BNP from previous day significantly elevated as well. Imaging consistent with pulmonary edema +/- atelectasis. Initial Vitals: 98.1, 20, 110, 95/73 & 93% on RA Significant labs: (Labs/ Imaging personally reviewed) I, Cheryll Cockayne Rust-Chester, AGACNP-BC, personally viewed and interpreted this ECG. EKG Interpretation: Date: Oct 11, 2022, EKG Time: 04:05, Rate: 76, Rhythm: NSR, QRS Axis: extreme Intervals: prolonged PR, incomplete RBBB, ST/T Wave abnormalities: none,  Narrative Interpretation: NSR with 1st degree HB and incomplete RBBB Chemistry: Na+: 122, K+: 5.8, BUN/Cr.: 88/ 4.52, Cl: 89, Serum CO2/ AG: 22/11 Hematology: WBC: 3.4 > 11.8, Hgb: 14.2, plt: 110,  Troponin: 35 > 27> 28, BNP: 3559.6, COVID-19: negative   VBG: 7.13/ 90 / - / 29.9  CXR 11-Oct-2022: low volume chest with worsening aeration at the bases. Unchanged cardiomegaly and vascular congestion.   PCCM consulted for assistance in management and monitoring due to hypotension as well as acute hypoxic/hypercapnic respiratory failure requiring BIPAP for evaluation of respiratory status and need for vasopressor  support. 09/13/22: Admit to SDU with acute hypoxic /hypercapnic respiratory failure requiring BiPAP support. 09/14/2022: Nephrology  consulted with plans for IHD on 09/15/2022 after vascular surgery places temporary catheter.  Remains on BiPAP.  Overnight PCCM consulted due to reports of hypotension and persistent somnolence for evaluation of respiratory status and need for vasopressor support 09/15/2022: Critically ill with multiorgan failure, on multiple vasopressors, to start on CRRT.  Failed trial of BiPAP requiring intubation and mechanical ventilation.  With near cardiac arrest event (severe hypotension and hypoxia) and subsequent cardiac arrest. High risk for death.  Palliative Care consulted for GOC conversations. Overnight patient declined on maximum vasopressor support, full mechanical ventilator support and CRRT ultimately leading to a cardiac arrest. Family notified.   Pertinent Labs and Studies  Significant Diagnostic Studies ECHOCARDIOGRAM COMPLETE  Result Date: 09/15/2022    ECHOCARDIOGRAM REPORT   Patient Name:   [name] Date of Exam: 09/15/2022 Medical Rec #:  [id number]        Height:       71.0 in Accession #:    [id number]       Weight:       390.1 lb Date of Birth:  [birth year]       BSA:          2.801 m Patient Age:    56 years         BP:           93/41 mmHg Patient Gender: M                HR:           84 bpm. Exam Location:  [facility] Procedure: 2D Echo, Cardiac Doppler and Color Doppler Indications:     Acute respiratory distress R06.03  History:         Patient has prior history of Echocardiogram examinations, most                  recent 11/11/2021. CHF; Risk Factors:Hypertension and Diabetes.  Sonographer:     Cristela Blue Referring Phys:  [id number] Inetta Fermo LAI Diagnosing Phys: Cristal Deer End MD  Sonographer Comments: Echo performed with patient supine and on artificial respirator and Technically challenging study due to limited acoustic windows. IMPRESSIONS  1. Left ventricular ejection fraction, by estimation, is 50 to 55%. The left ventricle has normal function. Left ventricular endocardial border not  optimally defined to evaluate regional wall motion. Unable to accurately assess left ventricular hypertrophy. Left ventricular diastolic function could not be evaluated. There is the interventricular septum is flattened in diastole ('D' shaped left ventricle), consistent with right ventricular volume overload.  2. Right ventricular systolic function is moderately reduced. The right ventricular size is severely enlarged. Tricuspid regurgitation signal is inadequate for assessing PA pressure.  3. Right atrial size was severely dilated.  4. The mitral valve was not well visualized. No evidence of mitral valve regurgitation.  5. The aortic valve is tricuspid. Aortic valve regurgitation not well-assessed. FINDINGS  Left Ventricle: Left ventricular ejection fraction, by estimation, is 50 to 55%. The left ventricle has normal function. Left ventricular endocardial border not optimally defined to evaluate regional wall motion. The left ventricular internal cavity size was normal in size. Unable to accurately assess left ventricular hypertrophy. The interventricular septum is flattened in diastole ('D' shaped left ventricle), consistent with right ventricular volume overload. Left ventricular diastolic function could not be evaluated. Right Ventricle: The right ventricular size is severely enlarged. No increase in right ventricular  wall thickness. Right ventricular systolic function is moderately reduced. Tricuspid regurgitation signal is inadequate for assessing PA pressure. Left Atrium: Left atrial size was not well visualized. Right Atrium: Right atrial size was severely dilated. Pericardium: Trivial pericardial effusion is present. Mitral Valve: The mitral valve was not well visualized. No evidence of mitral valve regurgitation. Tricuspid Valve: The tricuspid valve is normal in structure. Tricuspid valve regurgitation is mild. Aortic Valve: The aortic valve is tricuspid. Aortic valve regurgitation not well-assessed.  Aortic valve mean gradient measures 2.0 mmHg. Aortic valve peak gradient measures 3.0 mmHg. Aortic valve area, by VTI measures 1.88 cm. Pulmonic Valve: The pulmonic valve was grossly normal. Pulmonic valve regurgitation is mild. No evidence of pulmonic stenosis. Aorta: The aortic root is normal in size and structure. Pulmonary Artery: The pulmonary artery is not well seen. Venous: IVC assessment for right atrial pressure unable to be performed due to mechanical ventilation. IAS/Shunts: The interatrial septum was not well visualized.  LEFT VENTRICLE PLAX 2D LVIDd:         3.50 cm   Diastology LVIDs:         2.70 cm   LV e' medial:    4.46 cm/s LV PW:         1.20 cm   LV E/e' medial:  16.9 LV IVS:        1.40 cm   LV e' lateral:   6.85 cm/s LVOT diam:     2.20 cm   LV E/e' lateral: 11.0 LV SV:         18 LV SV Index:   6 LVOT Area:     3.80 cm  RIGHT VENTRICLE RV Basal diam:  5.80 cm RV Mid diam:    6.60 cm LEFT ATRIUM         Index       RIGHT ATRIUM           Index LA diam:    3.60 cm 1.29 cm/m  RA Area:     36.40 cm                                 RA Volume:   140.00 ml 49.98 ml/m  AORTIC VALVE AV Area (Vmax):    1.86 cm AV Area (Vmean):   1.66 cm AV Area (VTI):     1.88 cm AV Vmax:           86.50 cm/s AV Vmean:          56.200 cm/s AV VTI:            0.096 m AV Peak Grad:      3.0 mmHg AV Mean Grad:      2.0 mmHg LVOT Vmax:         42.30 cm/s LVOT Vmean:        24.600 cm/s LVOT VTI:          0.048 m LVOT/AV VTI ratio: 0.49  AORTA Ao Root diam: 3.80 cm MITRAL VALVE MV Area (PHT): 6.90 cm    SHUNTS MV Decel Time: 110 msec    Systemic VTI:  0.05 m MV E velocity: 75.50 cm/s  Systemic Diam: 2.20 cm MV A velocity: 47.70 cm/s MV E/A ratio:  1.58 Cristal Deer End MD Electronically signed by Yvonne Kendall MD Signature Date/Time: 09/15/2022/3:46:01 PM    Final (Updated)    DG Abd 1 View  Result Date: 09/15/2022 CLINICAL DATA:  Orogastric tube placement. EXAM: ABDOMEN - 1 VIEW COMPARISON:  None Available.  FINDINGS: Nasogastric tube is seen entering stomach. Based on position of side hole, distal tip of stomach is most likely within the body of the stomach. IMPRESSION: Nasogastric tube tip within the stomach. Electronically Signed   By: Lupita Raider M.D.   On: 09/15/2022 11:55   DG Chest Port 1 View  Result Date: 09/15/2022 CLINICAL DATA:  Central line placement. EXAM: PORTABLE CHEST 1 VIEW COMPARISON:  Same day. FINDINGS: Stable cardiomegaly. Endotracheal tube is in grossly good position. Nasogastric tube is seen entering stomach. Left internal jugular catheter is noted with tip in expected position of left brachiocephalic vein. Right internal jugular catheter is noted with tip in expected position of the SVC. Large right pleural effusion is noted with associated atelectasis or infiltrate. Left basilar opacity is noted concerning for atelectasis or infiltrate and associated effusion. Bony thorax is unremarkable. IMPRESSION: Endotracheal and nasogastric tubes are in grossly good position. Left internal jugular catheter is noted with tip in expected position of left brachiocephalic vein. Right internal jugular catheter is noted with tip in expected position of the SVC. Stable bilateral lung opacities as described above. Electronically Signed   By: Lupita Raider M.D.   On: 09/15/2022 11:53   DG Chest Port 1 View  Result Date: 09/15/2022 CLINICAL DATA:  Status post intubation EXAM: PORTABLE CHEST 1 VIEW COMPARISON:  Chest radiograph dated 09/15/2022 at 7 a.m. FINDINGS: Lines/tubes: Endotracheal tube tip projects 3.8 cm above the carina. Enteric tube tip reaches the diaphragm and terminates below the field of view. Right internal jugular venous catheter tip projects over the SVC. Lungs: Asymmetric right lung opacification. Slightly improved lung aeration with persistent dense left retrocardiac opacity. Pleura: Large right pleural effusion.  No pneumothorax. Heart/mediastinum: Right heart border is obscured.  Bones: No acute osseous abnormality. IMPRESSION: 1.  Support apparatus as described. 2. Large right pleural effusion with asymmetric right lung opacification. 3. Slightly improved lung aeration with persistent dense left retrocardiac opacity, likely atelectasis. Electronically Signed   By: Agustin Cree M.D.   On: 09/15/2022 09:35   DG Chest Port 1 View  Result Date: 09/15/2022 CLINICAL DATA:  [id number].  Check central line positioning. EXAM: PORTABLE CHEST 1 VIEW COMPARISON:  AP Lat chest 09/13/2022 at 7:08 p.m. FINDINGS: 7:00 a.m. A right IJ double-lumen catheter has been inserted with the tip in the upper SVC slightly above the azygous confluence. There is no visible pneumothorax. The heart is moderately enlarged. Central vascular engorgement continues to be noted. Increased moderate right-greater-than-left pleural effusions are noted today with right pleural fluid tracking to the apex. There is increased opacity in the left lower lobe and in the right lower lung field, consistent with pneumonia or atelectasis. The upper lung fields remain relatively clear. The mediastinum is normally outlined. There is aortic atherosclerosis. Moderate thoracic spondylosis. No new osseous findings. Overlying monitor wires. IMPRESSION: 1. Right IJ double-lumen catheter tip in the upper SVC slightly above the azygous confluence. No visible pneumothorax. 2. Increased moderate right-greater-than-left pleural effusions. 3. Increased opacity in the left lower lobe and in the right lower lung field consistent with pneumonia or atelectasis. Electronically Signed   By: Almira Bar M.D.   On: 09/15/2022 07:25   CT ABDOMEN PELVIS WO CONTRAST  Result Date: 09/14/2022 CLINICAL DATA:  Persistent transaminitis and worsening leukocytosis. Suspected sepsis. EXAM: CT ABDOMEN AND PELVIS WITHOUT CONTRAST TECHNIQUE: Multidetector CT imaging of the abdomen and pelvis was performed  following the standard protocol without IV contrast. RADIATION DOSE  REDUCTION: This exam was performed according to the departmental dose-optimization program which includes automated exposure control, adjustment of the mA and/or kV according to patient size and/or use of iterative reconstruction technique. COMPARISON:  CT 04/23/2022 FINDINGS: Lower chest: Large right pleural effusion. Complete collapse of the right lower lobe. Hepatobiliary: Cholelithiasis without evidence cholecystitis. Unremarkable noncontrast appearance of the liver. Pancreas: No acute abnormality. Spleen: Unremarkable. Adrenals/Urinary Tract: Stable adrenal glands. No urinary calculi or hydronephrosis. Nondistended bladder. Stomach/Bowel: Stomach is within normal limits. No obstruction. No bowel wall thickening. Tentative visualization of a normal appendix. Vascular/Lymphatic: Aortic atherosclerosis. No enlarged abdominal or pelvic lymph nodes. Reproductive: Unremarkable. Other: Body wall anasarca. Small volume perihepatic ascites. No free intraperitoneal air Musculoskeletal: No acute fracture. IMPRESSION: 1. Large right pleural effusion with complete collapse of the right lower lobe. Pneumonia is difficult to exclude. 2. Small volume perihepatic ascites. 3. Cholelithiasis without evidence of cholecystitis. 4. Body wall anasarca. Aortic Atherosclerosis (ICD10-I70.0). Electronically Signed   By: Minerva Fester M.D.   On: 09/14/2022 23:59   DG Chest 2 View  Result Date: 09/13/2022 CLINICAL DATA:  Shortness of breath. EXAM: CHEST - 2 VIEW COMPARISON:  09/13/2022 at 0433 hours. FINDINGS: Unchanged cardiomegaly and pulmonary venous congestion. Increased bibasilar airspace opacities. No definite pulmonary edema, pleural effusion or pneumothorax. IMPRESSION: Unchanged cardiomegaly and pulmonary venous congestion. Increased bibasilar airspace opacities. Electronically Signed   By: Orvan Falconer M.D.   On: 09/13/2022 19:48   DG Chest 2 View  Result Date: 09/13/2022 CLINICAL DATA:  Shortness of breath EXAM:  CHEST - 2 VIEW COMPARISON:  Yesterday FINDINGS: Cardiomegaly and vascular pedicle widening with low lung volumes and worsening haziness at the bases. No pneumothorax. IMPRESSION: Low volume chest with worsening aeration at the bases. Unchanged cardiomegaly and vascular congestion. Electronically Signed   By: Tiburcio Pea M.D.   On: 09/13/2022 05:35   DG Chest Portable 1 View  Result Date: 09/12/2022 CLINICAL DATA:  Shortness of breath EXAM: PORTABLE CHEST 1 VIEW COMPARISON:  05/17/2022 FINDINGS: Cardiomegaly and central vascular congestion. There may be trace pleural fluid. No edema or pneumothorax. IMPRESSION: Chronic cardiomegaly and central vascular congestion. Electronically Signed   By: Tiburcio Pea M.D.   On: 09/12/2022 06:51    Microbiology Recent Results (from the past 240 hour(s))  SARS Coronavirus 2 by RT PCR (hospital order, performed in [facility] hospital lab) *cepheid single result test* Anterior Nasal Swab     Status: None   Collection Time: 09/12/22  6:25 AM   Specimen: Anterior Nasal Swab  Result Value Ref Range Status   SARS Coronavirus 2 by RT PCR NEGATIVE NEGATIVE Final    Comment: (NOTE) SARS-CoV-2 target nucleic acids are NOT DETECTED.  The SARS-CoV-2 RNA is generally detectable in upper and lower respiratory specimens during the acute phase of infection. The lowest concentration of SARS-CoV-2 viral copies this assay can detect is 250 copies / mL. A negative result does not preclude SARS-CoV-2 infection and should not be used as the sole basis for treatment or other patient management decisions.  A negative result may occur with improper specimen collection / handling, submission of specimen other than nasopharyngeal swab, presence of viral mutation(s) within the areas targeted by this assay, and inadequate number of viral copies (<250 copies / mL). A negative result must be combined with clinical observations, patient history, and epidemiological  information.  Fact Sheet for Patients:   RoadLapTop.co.za  Fact Sheet for Healthcare  Providers: http://kim-miller.com/  This test is not yet approved or  cleared by the [place] and has been authorized for detection and/or diagnosis of SARS-CoV-2 by FDA under an Emergency Use Authorization (EUA).  This EUA will remain in effect (meaning this test can be used) for the duration of the COVID-19 declaration under Section 564(b)(1) of the Act, 21 U.S.C. section 360bbb-3(b)(1), unless the authorization is terminated or revoked sooner.  Performed at [facility], [address] [place], [place] [address]   MRSA Next Gen by PCR, Nasal     Status: None   Collection Time: 09/15/22  5:30 PM   Specimen: Nasal Mucosa; Nasal Swab  Result Value Ref Range Status   MRSA by PCR Next Gen NOT DETECTED NOT DETECTED Final    Comment: (NOTE) The GeneXpert MRSA Assay (FDA approved for NASAL specimens only), is one component of a comprehensive MRSA colonization surveillance program. It is not intended to diagnose MRSA infection nor to guide or monitor treatment for MRSA infections. Test performance is not FDA approved in patients less than 71 years old. Performed at [facility], [address] [place], [place] [address]   Respiratory (~20 pathogens) panel by PCR     Status: None   Collection Time: 09/15/22  5:31 PM   Specimen: Nasopharyngeal Swab; Respiratory  Result Value Ref Range Status   Adenovirus NOT DETECTED NOT DETECTED Final   Coronavirus 229E NOT DETECTED NOT DETECTED Final    Comment: (NOTE) The Coronavirus on the Respiratory Panel, DOES NOT test for the novel  Coronavirus (2019 nCoV)    Coronavirus HKU1 NOT DETECTED NOT DETECTED Final   Coronavirus NL63 NOT DETECTED NOT DETECTED Final   Coronavirus OC43 NOT DETECTED NOT DETECTED Final   Metapneumovirus NOT DETECTED NOT DETECTED Final   Rhinovirus /  Enterovirus NOT DETECTED NOT DETECTED Final   Influenza A NOT DETECTED NOT DETECTED Final   Influenza B NOT DETECTED NOT DETECTED Final   Parainfluenza Virus 1 NOT DETECTED NOT DETECTED Final   Parainfluenza Virus 2 NOT DETECTED NOT DETECTED Final   Parainfluenza Virus 3 NOT DETECTED NOT DETECTED Final   Parainfluenza Virus 4 NOT DETECTED NOT DETECTED Final   Respiratory Syncytial Virus NOT DETECTED NOT DETECTED Final   Bordetella pertussis NOT DETECTED NOT DETECTED Final   Bordetella Parapertussis NOT DETECTED NOT DETECTED Final   Chlamydophila pneumoniae NOT DETECTED NOT DETECTED Final   Mycoplasma pneumoniae NOT DETECTED NOT DETECTED Final    Comment: Performed at [facility], [address] [place], [place] [address]    Lab Basic Metabolic Panel: Recent Labs  Lab 09/12/22 [phone] 09/13/22 0413 09/13/22 1852 09/14/22 0323 09/14/22 1749 09/14/22 2133 09/15/22 0412 09/15/22 1731  NA 122*   < > 126*  --  125* 124* 127* 127*  K 5.9*   < > 5.7*  --  6.1* 6.3* 5.1 4.6  CL 90*   < > 89*  --  90* 89* 96* 90*  CO2 23   < > 28  --  25 24 22 23   GLUCOSE 142*   < > 135*  --  119* 114* 86 139*  BUN 80*   < > 90*  --  93* 98* 90* 85*  CREATININE 4.01*   < > 5.08* 5.16* 5.81* 5.74* 5.35* 4.35*  CALCIUM 8.6*   < > 8.7*  --  8.8* 8.5* 10.0 8.6*  MG 2.2  --   --   --  2.3 2.1 2.1  --  PHOS  --   --   --   --   --   --  5.6* 4.7*   < > = values in this interval not displayed.   Liver Function Tests: Recent Labs  Lab 09/12/22 0625 09/13/22 2058 09/14/22 2133 09/15/22 0412 09/15/22 1731  AST 54* 35 24 21  --   ALT 42 48* 42 34  --   ALKPHOS 164* 159* 132* 116  --   BILITOT 1.3* 1.2 1.5* 1.1  --   PROT 6.8 7.0 6.7 5.6*  --   ALBUMIN 3.0* 3.3* 3.0* 2.5* 2.6*   No results for input(s): "LIPASE", "AMYLASE" in the last 168 hours. No results for input(s): "AMMONIA" in the last 168 hours. CBC: Recent Labs  Lab 09/12/22 0625 09/13/22 0413 09/13/22 1852 09/14/22 0323  09/14/22 2133 09/15/22 0412  WBC 3.1* 3.4* 11.8* 15.3* 18.9* 16.3*  NEUTROABS 2.2  --   --   --   --   --   HGB 14.1 14.2 14.8 15.3 14.3 13.9  HCT 44.6 45.9 47.9 49.9 47.3 45.9  MCV 95.5 95.2 97.2 97.7 97.5 97.7  PLT 110* 110* 116* 94* 100* 91*   Cardiac Enzymes: No results for input(s): "CKTOTAL", "CKMB", "CKMBINDEX", "TROPONINI" in the last 168 hours. Sepsis Labs: Recent Labs  Lab 09/13/22 1852 09/14/22 0323 09/14/22 2133 09/14/22 2312 09/15/22 0412 09/15/22 0810 09/15/22 1024  PROCALCITON  --   --   --   --   --  21.38  --   WBC 11.8* 15.3* 18.9*  --  16.3*  --   --   LATICACIDVEN  --   --   --  1.3  --  1.5 2.0*    Procedures/Operations  09/15/22: CVC vascath placement 09/15/22: ETT placement 09/15/22: Arterial line placement 09/15/22: CVC placement  Shavonta Gossen L Rust-Chester , 3:17 AM  Cheryll Cockayne Rust-Chester, AGACNP-BC Acute Care Nurse Practitioner [facility]   [phone] / [phone] Please see Amion for details.

## 2022-09-27 NOTE — Progress Notes (Signed)
Cheryll Cockayne Andrey Farmer NP instructed nurse to change out CRRT at 2300 to restart a new CRRT machine with a working thermax  2315 CRRT stopped and blood returned  0033 CRRT restarted with Thermax at 35 degrees per NP

## 2022-09-27 NOTE — Progress Notes (Signed)
Patients Mother Montgomery Willmann took both of patients necklaces and his cane home.

## 2022-09-27 NOTE — Progress Notes (Signed)
ANTICOAGULATION CONSULT NOTE  Pharmacy Consult for heparin infusion Indication: pulmonary embolus  Allergies  Allergen Reactions   Talc     Nose bleeds    Patient Measurements: Height: 5\' 11"  (180.3 cm) Weight: (!) 176.9 kg (390 lb 1.6 oz) IBW/kg (Calculated) : 75.3 Heparin Dosing Weight: 113 kg  Vital Signs: Temp: 93 F (33.9 C) (08/21 0000) Temp Source: Axillary (08/20 1600) Pulse Rate: 71 (08/21 0000)  Labs: Recent Labs    09/13/22 2058 09/14/22 0323 09/14/22 1749 09/14/22 2133 09/15/22 0412 09/15/22 0810 09/15/22 1024 09/15/22 1731 09/15/22 2304  HGB  --  15.3  --  14.3 13.9  --   --   --   --   HCT  --  49.9  --  47.3 45.9  --   --   --   --   PLT  --  94*  --  100* 91*  --   --   --   --   HEPARINUNFRC  --   --   --   --   --   --   --   --  0.14*  CREATININE  --  5.16*   < > 5.74* 5.35*  --   --  4.35*  --   TROPONINIHS 28*  --   --   --   --  33* 33*  --   --    < > = values in this interval not displayed.    Estimated Creatinine Clearance: 31.5 mL/min (A) (by C-G formula based on SCr of 4.35 mg/dL (H)).   Medical History: Past Medical History:  Diagnosis Date   Allergy    Anemia    Asthma    CHF (congestive heart failure) (HCC)    Diabetes mellitus without complication (HCC)    Dyspnea    Gout    Hyperlipidemia    Hypertension    Morbid obesity (HCC)     Medications:  Scheduled:   allopurinol  200 mg Oral Daily   artificial tears  1 Application Both Eyes Q8H   atorvastatin  20 mg Oral Daily   budesonide (PULMICORT) nebulizer solution  0.5 mg Nebulization BID   Chlorhexidine Gluconate Cloth  6 each Topical Q0600   docusate  100 mg Per Tube BID   famotidine  10 mg Per Tube BID   hydrocortisone sod succinate (SOLU-CORTEF) inj  100 mg Intravenous Q12H   insulin aspart  0-9 Units Subcutaneous Q4H   ipratropium-albuterol  3 mL Nebulization Q4H   mouth rinse  15 mL Mouth Rinse Q2H   pantoprazole (PROTONIX) IV  40 mg Intravenous Q12H    polyethylene glycol  17 g Per Tube Daily   sodium chloride flush  3 mL Intravenous Q12H    Assessment: 57 year old male with past medical conditions including hyperlipidemia, morbid obesity, CHF, hypertension, chronic pain syndrome, chronic kidney disease stage IIIb, DM, gout and etoh abuse who is admitted with PNA, sepsis, possible cellulitis and AKI now on CRRT w/ presumed PE.   Goal of Therapy:  Heparin level 0.3-0.7 units/ml Monitor platelets by anticoagulation protocol: Yes   08/20 2304 HL 0.14, subtherapeutic  Plan:  Bolus 3400 units x 1 Increase heparin infusion to 2300 units. Recheck HL at 0800 after rate change CBC daily while on heparin  Otelia Sergeant, PharmD, [facility]  12:18 AM

## 2022-09-27 NOTE — BH Assessment (Signed)
NOTICE  OF  COMMITMENT  CHANGE  SENT  TO  CLERK OF  COURT  BY  [facility]  EMERGENCY DEPT  [facility]

## 2022-09-27 DEATH — deceased

## 2022-12-02 ENCOUNTER — Other Ambulatory Visit: Payer: PPO | Admitting: Pharmacist

## 2022-12-28 ENCOUNTER — Other Ambulatory Visit: Payer: Self-pay | Admitting: Internal Medicine

## 2023-01-05 ENCOUNTER — Other Ambulatory Visit: Payer: PPO

## 2023-01-08 ENCOUNTER — Ambulatory Visit: Payer: PPO | Admitting: Oncology

## 2023-01-08 ENCOUNTER — Ambulatory Visit: Payer: PPO

## 2023-01-12 ENCOUNTER — Ambulatory Visit: Payer: PPO | Admitting: Nurse Practitioner
